# Patient Record
Sex: Male | Born: 1950 | Race: Black or African American | Hispanic: No | Marital: Married | Smoking: Never smoker
Health system: Southern US, Community
[De-identification: ages and names within clinical notes are randomized; demographics above are authoritative.]

## PROBLEM LIST (undated history)

## (undated) DIAGNOSIS — E119 Type 2 diabetes mellitus without complications: Secondary | ICD-10-CM

## (undated) DIAGNOSIS — I6302 Cerebral infarction due to thrombosis of basilar artery: Secondary | ICD-10-CM

## (undated) DIAGNOSIS — N529 Male erectile dysfunction, unspecified: Secondary | ICD-10-CM

## (undated) DIAGNOSIS — H548 Legal blindness, as defined in USA: Secondary | ICD-10-CM

## (undated) DIAGNOSIS — N433 Hydrocele, unspecified: Secondary | ICD-10-CM

## (undated) DIAGNOSIS — H409 Unspecified glaucoma: Secondary | ICD-10-CM

## (undated) DIAGNOSIS — M1990 Unspecified osteoarthritis, unspecified site: Secondary | ICD-10-CM

## (undated) DIAGNOSIS — I10 Essential (primary) hypertension: Secondary | ICD-10-CM

## (undated) DIAGNOSIS — N189 Chronic kidney disease, unspecified: Secondary | ICD-10-CM

## (undated) DIAGNOSIS — I639 Cerebral infarction, unspecified: Secondary | ICD-10-CM

## (undated) HISTORY — DX: Chronic kidney disease, unspecified: N18.9

## (undated) HISTORY — DX: Type 2 diabetes mellitus without complications: E11.9

## (undated) HISTORY — DX: Cerebral infarction, unspecified: I63.9

## (undated) HISTORY — DX: Essential (primary) hypertension: I10

## (undated) HISTORY — PX: APPENDECTOMY: SHX54

## (undated) HISTORY — DX: Male erectile dysfunction, unspecified: N52.9

---

## 1992-09-03 HISTORY — PX: OTHER SURGICAL HISTORY: SHX169

## 2003-02-19 ENCOUNTER — Encounter: Admission: RE | Admit: 2003-02-19 | Discharge: 2003-05-20 | Payer: Self-pay | Admitting: Family Medicine

## 2003-09-04 HISTORY — PX: SHOULDER ARTHROSCOPY: SHX128

## 2004-12-13 ENCOUNTER — Ambulatory Visit: Payer: Self-pay | Admitting: Family Medicine

## 2004-12-18 ENCOUNTER — Ambulatory Visit: Payer: Self-pay | Admitting: Family Medicine

## 2005-01-01 ENCOUNTER — Ambulatory Visit: Payer: Self-pay | Admitting: Internal Medicine

## 2005-01-09 ENCOUNTER — Ambulatory Visit: Payer: Self-pay | Admitting: Family Medicine

## 2005-01-15 ENCOUNTER — Ambulatory Visit: Payer: Self-pay | Admitting: Internal Medicine

## 2005-01-19 ENCOUNTER — Ambulatory Visit: Payer: Self-pay | Admitting: Family Medicine

## 2005-01-30 ENCOUNTER — Ambulatory Visit: Payer: Self-pay | Admitting: Family Medicine

## 2005-01-30 ENCOUNTER — Encounter: Admission: RE | Admit: 2005-01-30 | Discharge: 2005-01-30 | Payer: Self-pay | Admitting: Family Medicine

## 2005-03-01 ENCOUNTER — Emergency Department (HOSPITAL_COMMUNITY): Admission: EM | Admit: 2005-03-01 | Discharge: 2005-03-01 | Payer: Self-pay | Admitting: Family Medicine

## 2005-03-22 ENCOUNTER — Ambulatory Visit: Payer: Self-pay | Admitting: Family Medicine

## 2005-05-18 ENCOUNTER — Encounter: Admission: RE | Admit: 2005-05-18 | Discharge: 2005-05-18 | Payer: Self-pay | Admitting: Family Medicine

## 2005-06-19 ENCOUNTER — Encounter: Admission: RE | Admit: 2005-06-19 | Discharge: 2005-08-17 | Payer: Self-pay | Admitting: Orthopaedic Surgery

## 2006-04-24 ENCOUNTER — Ambulatory Visit: Payer: Self-pay | Admitting: Family Medicine

## 2006-08-29 ENCOUNTER — Ambulatory Visit: Payer: Self-pay | Admitting: Family Medicine

## 2006-09-24 ENCOUNTER — Ambulatory Visit: Payer: Self-pay | Admitting: Family Medicine

## 2007-06-25 DIAGNOSIS — J45909 Unspecified asthma, uncomplicated: Secondary | ICD-10-CM | POA: Insufficient documentation

## 2007-07-08 ENCOUNTER — Ambulatory Visit: Payer: Self-pay | Admitting: Family Medicine

## 2007-07-08 DIAGNOSIS — I10 Essential (primary) hypertension: Secondary | ICD-10-CM | POA: Insufficient documentation

## 2007-10-09 DIAGNOSIS — H5440 Blindness, one eye, unspecified eye: Secondary | ICD-10-CM

## 2007-10-28 ENCOUNTER — Ambulatory Visit: Payer: Self-pay | Admitting: Family Medicine

## 2007-10-31 ENCOUNTER — Ambulatory Visit: Payer: Self-pay | Admitting: Family Medicine

## 2007-10-31 LAB — CONVERTED CEMR LAB
ALT: 23 units/L (ref 0–53)
Alkaline Phosphatase: 86 units/L (ref 39–117)
Basophils Relative: 0.1 % (ref 0.0–1.0)
Cholesterol: 186 mg/dL (ref 0–200)
Creatinine,U: 120.7 mg/dL
Eosinophils Relative: 1.5 % (ref 0.0–5.0)
GFR calc non Af Amer: 67 mL/min
Glucose, Bld: 320 mg/dL — ABNORMAL HIGH (ref 70–99)
HDL: 28.7 mg/dL — ABNORMAL LOW (ref >39.0)
Hemoglobin: 15.2 g/dL (ref 13.0–17.0)
LDL Cholesterol: 129 mg/dL — ABNORMAL HIGH (ref 0–99)
MCV: 89.5 fL (ref 78.0–100.0)
Microalb, Ur: 13.1 mg/dL — ABNORMAL HIGH (ref 0.0–1.9)
Monocytes Absolute: 0.3 10*3/uL (ref 0.2–0.7)
Monocytes Relative: 7.7 % (ref 3.0–11.0)
Neutrophils Relative %: 53 % (ref 43.0–77.0)
Platelets: 243 10*3/uL (ref 150–400)
RBC: 5.15 M/uL (ref 4.22–5.81)
RDW: 11.8 % (ref 11.5–14.6)
Sodium: 138 meq/L (ref 135–145)
Total Bilirubin: 1.2 mg/dL (ref 0.3–1.2)
Total CHOL/HDL Ratio: 6.5
Triglycerides: 143 mg/dL (ref 0–149)
VLDL: 29 mg/dL (ref 0–40)

## 2007-11-05 ENCOUNTER — Ambulatory Visit: Payer: Self-pay

## 2007-11-06 ENCOUNTER — Ambulatory Visit: Payer: Self-pay | Admitting: Family Medicine

## 2007-11-06 DIAGNOSIS — F528 Other sexual dysfunction not due to a substance or known physiological condition: Secondary | ICD-10-CM

## 2007-11-06 DIAGNOSIS — G629 Polyneuropathy, unspecified: Secondary | ICD-10-CM

## 2007-11-20 ENCOUNTER — Ambulatory Visit: Payer: Self-pay | Admitting: Family Medicine

## 2007-11-24 ENCOUNTER — Encounter: Payer: Self-pay | Admitting: Family Medicine

## 2007-11-26 ENCOUNTER — Encounter: Payer: Self-pay | Admitting: Family Medicine

## 2008-02-23 ENCOUNTER — Ambulatory Visit: Payer: Self-pay | Admitting: Family Medicine

## 2008-08-26 ENCOUNTER — Ambulatory Visit: Payer: Self-pay | Admitting: Family Medicine

## 2008-08-26 LAB — CONVERTED CEMR LAB
AST: 20 units/L (ref 0–37)
Albumin: 3.6 g/dL (ref 3.5–5.2)
Alkaline Phosphatase: 73 units/L (ref 39–117)
Bilirubin Urine: NEGATIVE
Bilirubin, Direct: 0.2 mg/dL (ref 0.0–0.3)
Calcium: 8.7 mg/dL (ref 8.4–10.5)
Chloride: 105 meq/L (ref 96–112)
Cholesterol: 151 mg/dL (ref 0–200)
Creatinine,U: 106.9 mg/dL
Eosinophils Absolute: 0.1 10*3/uL (ref 0.0–0.7)
Eosinophils Relative: 1.7 % (ref 0.0–5.0)
HCT: 39.3 % (ref 39.0–52.0)
Hgb A1c MFr Bld: 8.4 % — ABNORMAL HIGH (ref 4.6–6.0)
Ketones, urine, test strip: NEGATIVE
MCHC: 34.3 g/dL (ref 30.0–36.0)
Microalb, Ur: 10.9 mg/dL — ABNORMAL HIGH (ref 0.0–1.9)
Monocytes Absolute: 0.2 10*3/uL (ref 0.1–1.0)
Neutro Abs: 2 10*3/uL (ref 1.4–7.7)
Neutrophils Relative %: 50.9 % (ref 43.0–77.0)
Nitrite: NEGATIVE
PSA: 0.33 ng/mL (ref 0.10–4.00)
Platelets: 223 10*3/uL (ref 150–400)
Potassium: 3.8 meq/L (ref 3.5–5.1)
RBC: 4.35 M/uL (ref 4.22–5.81)
RDW: 11.5 % (ref 11.5–14.6)
Sodium: 139 meq/L (ref 135–145)
Triglycerides: 84 mg/dL (ref 0–149)
VLDL: 17 mg/dL (ref 0–40)
WBC Urine, dipstick: NEGATIVE
WBC: 3.8 10*3/uL — ABNORMAL LOW (ref 4.5–10.5)

## 2008-09-24 ENCOUNTER — Ambulatory Visit: Payer: Self-pay | Admitting: Family Medicine

## 2008-10-18 ENCOUNTER — Ambulatory Visit: Payer: Self-pay | Admitting: Family Medicine

## 2008-11-02 ENCOUNTER — Telehealth: Payer: Self-pay | Admitting: Family Medicine

## 2008-12-01 ENCOUNTER — Ambulatory Visit: Payer: Self-pay

## 2008-12-10 ENCOUNTER — Ambulatory Visit: Payer: Self-pay | Admitting: Family Medicine

## 2008-12-10 LAB — CONVERTED CEMR LAB
Calcium: 8.7 mg/dL (ref 8.4–10.5)
Chloride: 107 meq/L (ref 96–112)
Creatinine, Ser: 0.9 mg/dL (ref 0.4–1.5)
GFR calc non Af Amer: 111.74 mL/min (ref >60)
Glucose, Bld: 110 mg/dL — ABNORMAL HIGH (ref 70–99)
Hgb A1c MFr Bld: 6.6 % — ABNORMAL HIGH (ref 4.6–6.5)
Sodium: 143 meq/L (ref 135–145)

## 2008-12-13 ENCOUNTER — Ambulatory Visit (HOSPITAL_COMMUNITY): Admission: RE | Admit: 2008-12-13 | Discharge: 2008-12-13 | Payer: Self-pay | Admitting: Ophthalmology

## 2008-12-13 HISTORY — PX: OTHER SURGICAL HISTORY: SHX169

## 2008-12-14 ENCOUNTER — Telehealth: Payer: Self-pay | Admitting: Family Medicine

## 2008-12-20 ENCOUNTER — Telehealth: Payer: Self-pay | Admitting: Family Medicine

## 2008-12-24 ENCOUNTER — Ambulatory Visit: Payer: Self-pay | Admitting: Family Medicine

## 2009-01-07 DIAGNOSIS — R519 Headache, unspecified: Secondary | ICD-10-CM | POA: Insufficient documentation

## 2009-01-07 DIAGNOSIS — R51 Headache: Secondary | ICD-10-CM

## 2009-01-10 ENCOUNTER — Ambulatory Visit: Payer: Self-pay | Admitting: Family Medicine

## 2009-01-11 ENCOUNTER — Telehealth: Payer: Self-pay | Admitting: Family Medicine

## 2009-01-12 ENCOUNTER — Telehealth: Payer: Self-pay | Admitting: Family Medicine

## 2009-03-24 ENCOUNTER — Ambulatory Visit (HOSPITAL_COMMUNITY): Admission: RE | Admit: 2009-03-24 | Discharge: 2009-03-24 | Payer: Self-pay | Admitting: Ophthalmology

## 2009-03-24 HISTORY — PX: OTHER SURGICAL HISTORY: SHX169

## 2009-03-30 ENCOUNTER — Ambulatory Visit: Payer: Self-pay | Admitting: Family Medicine

## 2009-03-30 LAB — CONVERTED CEMR LAB
CO2: 30 meq/L (ref 19–32)
Chloride: 105 meq/L (ref 96–112)
Hgb A1c MFr Bld: 7.9 % — ABNORMAL HIGH (ref 4.6–6.5)
Microalb Creat Ratio: 179.3 mg/g — ABNORMAL HIGH (ref 0.0–30.0)
Microalb, Ur: 24.1 mg/dL — ABNORMAL HIGH (ref 0.0–1.9)
Sodium: 140 meq/L (ref 135–145)

## 2009-04-01 ENCOUNTER — Ambulatory Visit: Payer: Self-pay | Admitting: Family Medicine

## 2009-04-05 ENCOUNTER — Telehealth: Payer: Self-pay | Admitting: Family Medicine

## 2009-04-08 ENCOUNTER — Ambulatory Visit: Payer: Self-pay | Admitting: Family Medicine

## 2009-05-04 ENCOUNTER — Telehealth: Payer: Self-pay | Admitting: Family Medicine

## 2009-05-25 ENCOUNTER — Telehealth: Payer: Self-pay | Admitting: Family Medicine

## 2009-06-06 ENCOUNTER — Telehealth: Payer: Self-pay | Admitting: Family Medicine

## 2009-06-17 ENCOUNTER — Telehealth: Payer: Self-pay | Admitting: Family Medicine

## 2010-01-26 ENCOUNTER — Ambulatory Visit: Payer: Self-pay | Admitting: Family Medicine

## 2010-01-26 LAB — CONVERTED CEMR LAB
ALT: 19 units/L (ref 0–53)
AST: 18 units/L (ref 0–37)
Alkaline Phosphatase: 61 units/L (ref 39–117)
BUN: 18 mg/dL (ref 6–23)
Basophils Absolute: 0 10*3/uL (ref 0.0–0.1)
Bilirubin Urine: NEGATIVE
Bilirubin, Direct: 0.1 mg/dL (ref 0.0–0.3)
CO2: 28 meq/L (ref 19–32)
Calcium: 9.1 mg/dL (ref 8.4–10.5)
Cholesterol: 139 mg/dL (ref 0–200)
Creatinine, Ser: 1 mg/dL (ref 0.4–1.5)
GFR calc non Af Amer: 94.2 mL/min (ref >60)
Glucose, Urine, Semiquant: 100
HCT: 38.4 % — ABNORMAL LOW (ref 39.0–52.0)
HDL: 27.7 mg/dL — ABNORMAL LOW (ref >39.00)
Hemoglobin: 13.1 g/dL (ref 13.0–17.0)
Ketones, urine, test strip: NEGATIVE
MCHC: 34 g/dL (ref 30.0–36.0)
MCV: 93.1 fL (ref 78.0–100.0)
Microalb Creat Ratio: 5.2 mg/g (ref 0.0–30.0)
Monocytes Relative: 8.3 % (ref 3.0–12.0)
Neutro Abs: 1.8 10*3/uL (ref 1.4–7.7)
Nitrite: NEGATIVE
PSA: 0.5 ng/mL (ref 0.10–4.00)
Potassium: 4.8 meq/L (ref 3.5–5.1)
RBC: 4.13 M/uL — ABNORMAL LOW (ref 4.22–5.81)
Specific Gravity, Urine: 1.025
TSH: 1.2 microintl units/mL (ref 0.35–5.50)
Triglycerides: 106 mg/dL (ref 0.0–149.0)
VLDL: 21.2 mg/dL (ref 0.0–40.0)
pH: 5.5

## 2010-02-02 ENCOUNTER — Ambulatory Visit: Payer: Self-pay | Admitting: Family Medicine

## 2010-02-02 LAB — HM DIABETES FOOT EXAM

## 2010-02-24 ENCOUNTER — Telehealth: Payer: Self-pay | Admitting: Family Medicine

## 2010-03-01 ENCOUNTER — Telehealth: Payer: Self-pay | Admitting: Family Medicine

## 2010-04-11 ENCOUNTER — Telehealth: Payer: Self-pay | Admitting: *Deleted

## 2010-04-18 ENCOUNTER — Encounter: Payer: Self-pay | Admitting: Family Medicine

## 2010-04-25 ENCOUNTER — Telehealth: Payer: Self-pay | Admitting: Family Medicine

## 2010-05-17 ENCOUNTER — Ambulatory Visit: Payer: Self-pay | Admitting: Family Medicine

## 2010-05-17 LAB — CONVERTED CEMR LAB
CO2: 29 meq/L (ref 19–32)
Calcium: 9.7 mg/dL (ref 8.4–10.5)
Chloride: 107 meq/L (ref 96–112)
Glucose, Bld: 125 mg/dL — ABNORMAL HIGH (ref 70–99)
Hgb A1c MFr Bld: 7.9 % — ABNORMAL HIGH (ref 4.6–6.5)
Potassium: 4.7 meq/L (ref 3.5–5.1)
Sodium: 142 meq/L (ref 135–145)

## 2010-06-01 ENCOUNTER — Telehealth: Payer: Self-pay | Admitting: Internal Medicine

## 2010-06-01 ENCOUNTER — Ambulatory Visit: Payer: Self-pay | Admitting: Family Medicine

## 2010-06-01 DIAGNOSIS — R159 Full incontinence of feces: Secondary | ICD-10-CM | POA: Insufficient documentation

## 2010-08-24 ENCOUNTER — Ambulatory Visit: Payer: Self-pay | Admitting: Family Medicine

## 2010-08-25 ENCOUNTER — Telehealth: Payer: Self-pay | Admitting: Family Medicine

## 2010-08-25 LAB — CONVERTED CEMR LAB
BUN: 29 mg/dL — ABNORMAL HIGH (ref 6–23)
CO2: 27 meq/L (ref 19–32)
Calcium: 9.2 mg/dL (ref 8.4–10.5)
Chloride: 101 meq/L (ref 96–112)
Creatinine, Ser: 1.3 mg/dL (ref 0.4–1.5)
Glucose, Bld: 236 mg/dL — ABNORMAL HIGH (ref 70–99)
Potassium: 5 meq/L (ref 3.5–5.1)

## 2010-09-07 ENCOUNTER — Ambulatory Visit: Admit: 2010-09-07 | Payer: Self-pay | Admitting: Family Medicine

## 2010-09-08 ENCOUNTER — Telehealth: Payer: Self-pay | Admitting: Family Medicine

## 2010-10-03 NOTE — Progress Notes (Signed)
Summary: [facility] called and said paperwork has not been rcvd. Pls fax  Phone Note From Other Clinic Call back at [place] ref # [id number]       Caller: [facility]  - Claiborne Billings Summary of Call: [facility] called re: paperwork. Evidently paperwork was completed, but never faxed back to [facility]. Pls fax asap to [phone] attn [phone] Initial call taken by: Braulio Bosch,  April 25, 2010 10:33 AM  Follow-up for Phone Call        re-faxed Follow-up by: Westley Hummer CMA Deborra Medina),  April 25, 2010 11:07 AM

## 2010-10-03 NOTE — Letter (Signed)
Summary: Medical Questionnaire  Medical Questionnaire   Imported By: Laural Benes 06/27/2010 10:19:44  _____________________________________________________________________  External Attachment:    Type:   Image     Comment:   External Document

## 2010-10-03 NOTE — Assessment & Plan Note (Signed)
Summary: CPX // RS   Vital Signs:  Patient profile:   60 year old male Height:      66.75 inches Weight:      165 pounds BMI:     26.13 Temp:     98.2 degrees F oral BP sitting:   130 / 90  (left arm) Cuff size:   regular  Vitals Entered By: Westley Hummer CMA Deborra Medina) (February 02, 2010 10:05 AM) CC: cpx  Vision Screening:      Vision Comments: 10/2010   CC:  cpx.  History of Present Illness: Russell Austin is a 60 year old male, retired...........Marland Kitchen because of blindness in his right eye......... who comes in today for evaluation of diabetes, hypertension.  His diabetes is treated with metformin 1000 mg b.i.d. and glipizide 10 mg b.i.d.  Fasting blood sugar 156 with a hemoglobin A1c of 7.1.  We discussed the fact that we are maxed out with oral medications.  The next step will be insulin unless he gets his sugar down.  He takes Zestril 80 mg daily for hypertension.  BP 130/90.  He also takes an aspirin tablet and 25 mg of HCTZ daily.  His left eye is normal.  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  Etiology of which is unknown.  He sees his ophthalmologist on a regular basis.  He also gets routine dental care, colonoscopy, normal, tetanus booster unknown.  Exam, February 2011  Diabetes Management History:      He says that he is exercising.    Allergies: 1)  ! * Fruit Skins  Past History:  Past medical, surgical, family and social histories (including risk factors) reviewed, and no changes noted (except as noted below).  Past Medical History: Reviewed history from 09/24/2008 and no changes required. Diabetes mellitus, type II Hypertension glaucoma right eye 2009 Dr. Elonda Husky, ophthalmologist ED diabetic retinopathy  Past Surgical History: Reviewed history from 09/24/2008 and no changes required. laser surgery, diabetic retinopathy  Family History: Reviewed history from 10/28/2007 and no changes required. mother had glaucoma  Social History: Reviewed history from 11/20/2007 and  no changes required. Alcohol use-no Drug use-no Regular exercise-yes Retired  Review of Systems      See HPI  Physical Exam  General:  Well-developed,well-nourished,in no acute distress; alert,appropriate and cooperative throughout examination Head:  Normocephalic and atraumatic without obvious abnormalities. No apparent alopecia or balding. Eyes:  left eye, normal.  Totally blind right eye, constant tearing Ears:  External ear exam shows no significant lesions or deformities.  Otoscopic examination reveals clear canals, tympanic membranes are intact bilaterally without bulging, retraction, inflammation or discharge. Hearing is grossly normal bilaterally. Nose:  External nasal examination shows no deformity or inflammation. Nasal mucosa are pink and moist without lesions or exudates. Mouth:  Oral mucosa and oropharynx without lesions or exudates.  Teeth in good repair. Neck:  No deformities, masses, or tenderness noted. Chest Wall:  No deformities, masses, tenderness or gynecomastia noted. Breasts:  No masses or gynecomastia noted Lungs:  Normal respiratory effort, chest expands symmetrically. Lungs are clear to auscultation, no crackles or wheezes. Heart:  Normal rate and regular rhythm. S1 and S2 normal without gallop, murmur, click, rub or other extra sounds. Abdomen:  Bowel sounds positive,abdomen soft and non-tender without masses, organomegaly or hernias noted. Rectal:  No external abnormalities noted. Normal sphincter tone. No rectal masses or tenderness. Genitalia:  Testes bilaterally descended without nodularity, tenderness or masses. No scrotal masses or lesions. No penis lesions or urethral discharge. Prostate:  Prostate gland firm and smooth, no enlargement, nodularity, tenderness, mass, asymmetry or induration. Msk:  No deformity or scoliosis noted of thoracic or lumbar spine.   Pulses:  R and L carotid,radial,femoral,dorsalis pedis and posterior tibial pulses are full and  equal bilaterally Extremities:  No clubbing, cyanosis, edema, or deformity noted with normal full range of motion of all joints.   Neurologic:  No cranial nerve deficits noted. Station and gait are normal. Plantar reflexes are down-going bilaterally. DTRs are symmetrical throughout. Sensory, motor and coordinative functions appear intact. Skin:  Intact without suspicious lesions or rashes Cervical Nodes:  No lymphadenopathy noted Axillary Nodes:  No palpable lymphadenopathy Inguinal Nodes:  No significant adenopathy Psych:  Cognition and judgment appear intact. Alert and cooperative with normal attention span and concentration. No apparent delusions, illusions, hallucinations  Diabetes Management Exam:    Foot Exam (with socks and/or shoes not present):       Sensory-Pinprick/Light touch:          Left medial foot (L-4): normal          Left dorsal foot (L-5): normal          Left lateral foot (S-1): normal          Right medial foot (L-4): normal          Right dorsal foot (L-5): normal          Right lateral foot (S-1): normal       Sensory-Monofilament:          Left foot: normal          Right foot: normal       Inspection:          Left foot: normal          Right foot: normal       Nails:          Left foot: normal          Right foot: normal    Eye Exam:       Eye Exam done elsewhere          Date: 10/04/2009          Results: normal          Done by: rakin   Impression & Recommendations:  Problem # 1:  HYPERTENSION (ICD-401.9) Assessment Improved  His updated medication list for this problem includes:    Zestril 40 Mg Tabs (Lisinopril) .Marland Kitchen... 2 by mouth qam    Hydrochlorothiazide 25 Mg Tabs (Hydrochlorothiazide) .Marland Kitchen... Take 1 tablet by mouth every morning  Orders: Prescription Created Electronically [id number]) EKG w/ Interpretation (93000)  Problem # 2:  DIABETES MELLITUS, TYPE II (ICD-250.00) Assessment: Deteriorated  His updated medication list for this problem  includes:    Metformin Hcl 1000 Mg Tabs (Metformin hcl) ..... One bid    Zestril 40 Mg Tabs (Lisinopril) .Marland Kitchen... 2 by mouth qam    Glipizide 10 Mg Tb24 (Glipizide) .Marland Kitchen... Take 1 tablet by mouth two times a day    Aspirin Ec 325 Mg Tbec (Aspirin) ..... Every other day  Orders: Prescription Created Electronically [phone]) EKG w/ Interpretation (93000)  Problem # 3:  VISUAL ACUITY, DECREASED, RIGHT EYE (ICD-369.9) Assessment: Deteriorated  Complete Medication List: 1)  Metformin Hcl 1000 Mg Tabs (Metformin hcl) .... One bid 2)  Zestril 40 Mg Tabs (Lisinopril) .... 2 by mouth qam 3)  Glipizide 10 Mg Tb24 (Glipizide) .... Take 1 tablet by mouth two times a day 4)  Aspirin  Ec 325 Mg Tbec (Aspirin) .... Every other day 5)  Hydrochlorothiazide 25 Mg Tabs (Hydrochlorothiazide) .... Take 1 tablet by mouth every morning 6)  Freestyle Lite Test Strp (Glucose blood) .... Use daily to test glucose  Other Orders: Tdap => 73yrs IM [id number]) Admin 1st Vaccine [id number])  Patient Instructions: 1)  focus on your diet in 30 minutes, walking daily. 2)  Please schedule a follow-up appointment in 3 months. 3)  BMP prior to visit, ICD-9: 4)  HbgA1C prior to visit, ICD-9:........250.00 5)  Take an Aspirin every day. 6)  Check your blood sugars regularly. If your readings are usually above : or below 70 you should contact our office. 7)  It is important that your Diabetic A1c level is checked every 3 months. 8)  See your eye doctor yearly to check for diabetic eye damage. 9)  Check your feet each night for sore areas, calluses or signs of infection. 10)  Check your Blood Pressure regularly. If it is above: you should make an appointment. Prescriptions: FREESTYLE LITE TEST  STRP (GLUCOSE BLOOD) use daily to test glucose  #100 x 3   Entered and Authorized by:   Dorena Cookey MD   Signed by:   Dorena Cookey MD on 02/02/2010   Method used:   Electronically to        [facility] (retail)       [address]       [place], [place]  [address]       Ph: [phone]       Fax: [phone]   RxID:   [id number] HYDROCHLOROTHIAZIDE 25 MG TABS (HYDROCHLOROTHIAZIDE) Take 1 tablet by mouth every morning  #100 x 3   Entered and Authorized by:   Dorena Cookey MD   Signed by:   Dorena Cookey MD on 02/02/2010   Method used:   Electronically to        [facility] (retail)       [address]       [place], [place]  [address]       Ph: [phone]       Fax: [phone]   RxID:   [id number] GLIPIZIDE 10 MG  TB24 (GLIPIZIDE) Take 1 tablet by mouth two times a day  #200 x 3   Entered and Authorized by:   Dorena Cookey MD   Signed by:   Dorena Cookey MD on 02/02/2010   Method used:   Electronically to        [facility] (retail)       [address]       [place], [place]  [address]       Ph: [phone]       Fax: [phone]   RxID:   [id number] ZESTRIL 40 MG  TABS (LISINOPRIL) 2 by mouth qam  #200 x 3   Entered and Authorized by:   Dorena Cookey MD   Signed by:   Dorena Cookey MD on 02/02/2010   Method used:   Electronically to        [facility] (retail)       [address]       [place], [place]  [address]  Ph: [phone]       Fax: [phone]   [id number] METFORMIN HCL 1000 MG  TABS (METFORMIN HCL) one bid  #200 x 3   Entered and Authorized by:   Dorena Cookey MD   Signed by:   Dorena Cookey MD on 02/02/2010   Method used:   Electronically to        [facility] (retail)       [address]       [place], [place]  [address]       Ph: [phone]       Fax: [phone]   RxID:   [id number]    Immunizations Administered:  Tetanus Vaccine:    Vaccine Type: Tdap    Site: right deltoid    Mfr: GlaxoSmithKline    Dose: 0.5 ml    Route: IM    Given by: Westley Hummer CMA ([place])     Exp. Date: 11/26/2011    Lot #: [id number]    Physician counseled: yes

## 2010-10-03 NOTE — Progress Notes (Signed)
Summary: med refill  Phone Note Call from Patient   Caller: Patient Call For: Dorena Cookey MD Summary of Call: pt needs refill on freestyle lite test strip call into [facility] [phone] Initial call taken by: Glo Herring,  March 01, 2010 9:59 AM    Prescriptions: FREESTYLE LITE TEST  STRP (GLUCOSE BLOOD) use daily to test glucose  #100 x 3   Entered by:   Westley Hummer CMA ([place])   Authorized by:   Dorena Cookey MD   Signed by:   Westley Hummer CMA ([place]) on 03/01/2010   Method used:   Electronically to        [facility] (retail)       [address]       [place], [place]  [address]       Ph: [phone]       Fax: [phone]   RxID:   [id number]

## 2010-10-03 NOTE — Progress Notes (Signed)
Summary: Fecal incontenence  Phone Note From Other Clinic   Caller: Terry @Dr  Todd Herby Abraham [phone] Call For: Dr 05-22-1984 Reason for Call: Schedule Patient Appt Summary of Call: Fecal Incontenece while sleeping.  Initial call taken by: Olevia Perches [facility],  June 01, 2010 5:01 PM  Follow-up for Phone Call        Pt. will see Dr.Hilmar Moldovan on 06-07-10 at 11:30am. 04-26-1973 will advise pt. of appt./med.list/co-pay/cx.policy. Follow-up by: Coralyn Mark LPN,  September 30, Vivia Ewing 9:27 AM     Appended Document: Fecal incontenence Pt.'s appt. rescheduled to [id number] NP on 06-07-10 at 11am. Pt. advised.

## 2010-10-03 NOTE — Progress Notes (Signed)
Summary: status of forms  Phone Note Other Incoming Call back at [phone]   Caller: Janett Billow--      voice mail Summary of Call: Following up on some questionaires that were sent to Dr Sherren Mocha. ref #----[id number] Initial call taken by: Despina Arias,  February 24, 2010 4:51 PM  Follow-up for Phone Call        I have not seen a form for Mr Wagar Follow-up by: Westley Hummer CMA Deborra Medina),  March 01, 2010 9:21 AM

## 2010-10-03 NOTE — Assessment & Plan Note (Signed)
Summary: follow up/cjr   Vital Signs:  Patient profile:   60 year old male Weight:      188 pounds Temp:     98.0 degrees F oral BP sitting:   130 / 84  (left arm) Cuff size:   regular  Vitals Entered By: Westley Hummer CMA Deborra Medina) (June 01, 2010 11:31 AM)  CC: follow-up visit   CC:  follow-up visit.  History of Present Illness: al is a 60 year old male, who comes in today for evaluation of diabetes, hypertension, and rectal incontinence.  His diabetes is treated with metformin 1000 mg b.i.d., glipizide, 10 mg b.i.d., fasting blood sugar 125, hemoglobin A1c7 .9%.  We discussed serious options.  Prior to going on insulin he would try a 30 minute walk daily to see if he can control his blood sugar with exercise.  BP 132/84 on Zestril 40 mg dose two tabs daily and had or thiazide 25 mg daily.  Admitted from his rectal incontinence.  Says he was sleeping a day and had a bowel movement in his sleep and was not aware of it.  No history of constipation.  He has had a colonoscopy in the past and was normal  Allergies: 1)  ! * Fruit Skins  Past History:  Past medical, surgical, family and social histories (including risk factors) reviewed for relevance to current acute and chronic problems.  Past Medical History: Reviewed history from 09/24/2008 and no changes required. Diabetes mellitus, type II Hypertension glaucoma right eye 2009 Dr. Elonda Husky, ophthalmologist ED diabetic retinopathy  Past Surgical History: Reviewed history from 09/24/2008 and no changes required. laser surgery, diabetic retinopathy  Family History: Reviewed history from 10/28/2007 and no changes required. mother had glaucoma  Social History: Reviewed history from 02/02/2010 and no changes required. Alcohol use-no Drug use-no Regular exercise-yes Retired  Review of Systems      See HPI  Physical Exam  General:  Well-developed,well-nourished,in no acute distress; alert,appropriate and cooperative  throughout examination   Impression & Recommendations:  Problem # 1:  DIABETES MELLITUS, TYPE II (ICD-250.00) Assessment Deteriorated  His updated medication list for this problem includes:    Metformin Hcl 1000 Mg Tabs (Metformin hcl) ..... One bid    Zestril 40 Mg Tabs (Lisinopril) .Marland Kitchen... 2 by mouth qam    Glipizide 10 Mg Tb24 (Glipizide) .Marland Kitchen... Take 1 tablet by mouth two times a day    Aspirin Ec 325 Mg Tbec (Aspirin) ..... 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 One bid 2)  Zestril 40 Mg Tabs (Lisinopril) .... 2 by mouth qam 3)  Glipizide 10 Mg Tb24 (Glipizide) .... Take 1 tablet by mouth two times a day 4)  Aspirin Ec 325 Mg Tbec (Aspirin) .... Every other day 5)  Hydrochlorothiazide 25 Mg Tabs (Hydrochlorothiazide) .... Take 1 tablet by mouth every morning 6)  Freestyle Lite Test Strp (Glucose blood) .... Use daily to test glucose  Patient Instructions: 1)  continue your current medications and diet.  Walks 30 minutes daily.  Return in 3 months for follow-up. 2)  I will put in a request for a consult with your gastroenterologist 3)  BMP prior to visit, ICD-9:......250.00 4)  HbgA1C prior to visit, ICD-9:

## 2010-10-03 NOTE — Procedures (Signed)
Summary: COLONOSCOPY   Colonoscopy  Procedure date:  01/15/2005  Findings:      Location:  [facility].     Patient Name: Russell Austin, Russell Austin MRN:  Procedure Procedures: Colonoscopy CPT: [id number].  Personnel: Endoscopist: Allona Gondek L. Olevia Perches, MD.  Referred By: Jory Ee Sherren Mocha, MD.  Exam Location: Exam performed in Outpatient Clinic. Outpatient  Patient Consent: Procedure, Alternatives, Risks and Benefits discussed, consent obtained, from patient. Consent was obtained by the RN.  Indications  Average Risk Screening Routine.  History  Current Medications: Patient is not currently taking Coumadin.  Pre-Exam Physical: Performed Jan 15, 2005. Entire physical exam was normal.  Exam Exam: Extent of exam reached: Cecum, extent intended: Cecum.  The cecum was identified by appendiceal orifice and IC valve. Colon retroflexion performed. Images taken. ASA Classification: I. Tolerance: good.  Monitoring: Pulse and BP monitoring, Oximetry used. Supplemental O2 given.  Colon Prep Used Miralax for colon prep. Prep results: good.  Sedation Meds: Patient assessed and found to be appropriate for moderate (conscious) sedation. Fentanyl 75 mcg. given IV. Versed 7 mg. given IV.  Findings - NORMAL EXAM: Cecum.  - NORMAL EXAM: Rectum.   Assessment Normal examination.  Comments: no polyps Events  Unplanned Interventions: No intervention was required.  Unplanned Events: There were no complications. Plans Patient Education: Patient given standard instructions for: Yearly hemoccult testing recommended. Patient instructed to get routine colonoscopy every 10 years.  Comments: follow up with Dr Sherren Mocha Disposition: After procedure patient sent to recovery. After recovery patient sent home.    CC: Dellis Filbert A.Todd,MD  This report was created from the original endoscopy report, which was reviewed and signed by the above listed endoscopist.

## 2010-10-03 NOTE — Progress Notes (Signed)
Summary: [place] called re: status of questionaire  Phone Note From Other Clinic Call back at [phone] [facility]   Caller: [place] Summary of Call: [facility] called re: a questionair that was sent to Dr. Sherren Mocha re: pts [place]. [facility] rcvd pts Med Records that they had requested, but they did not rcv the questionaire that was sent to Dr. Sherren Mocha to complete and return. Pls call asap [phone] and use Ref# [id number]     Initial call taken by: Braulio Bosch,  April 11, 2010 9:51 AM  Follow-up for Phone Call        spoke with patient and paperwork done Follow-up by: Westley Hummer CMA Deborra Medina),  April 18, 2010 11:00 AM

## 2010-10-05 NOTE — Progress Notes (Signed)
Summary: refill generic  Phone Note Refill Request Call back at Home Phone [phone] Message from:  Patient---live call  Refills Requested: Medication #1:  ZESTRIL 40 MG  TABS 2 by mouth qam   Brand Name Necessary? No fax generic form to [facility] at [phone]  Initial call taken by: Despina Arias,  September 08, 2010 1:53 PM    Prescriptions: ZESTRIL 40 MG  TABS (LISINOPRIL) 2 by mouth qam  #200 x 3   Entered by:   Westley Hummer CMA ([place])   Authorized by:   Dorena Cookey MD   Signed by:   Westley Hummer CMA ([place]) on 09/08/2010   Method used:   Faxed to ...       [facility] [employer] officer)       [address]  [address]       Ph: [phone]       Fax: [phone]   RxID:   [id number]

## 2010-10-05 NOTE — Progress Notes (Signed)
Summary: appointment  Phone Note Call from Patient   Reason for Call: Talk to Doctor Summary of Call: patient is unable to come into the office today.   He does not want to see another provider.  any suggestions? cell [phone] Initial call taken by: Westley Hummer CMA Deborra Medina),  August 25, 2010 10:39 AM  Follow-up for Phone Call        I explained to out that his blood sugar was extremely high, over 200, hemoglobin A1c was 8.  Therefore, he needs to start insulin.  He is on max doses of oral medication.  He declined to come in today and stated he would  call and come in after the new year Follow-up by: Dorena Cookey MD,  August 25, 2010 11:26 AM

## 2010-12-10 LAB — GLUCOSE, CAPILLARY: Glucose-Capillary: 173 mg/dL — ABNORMAL HIGH (ref 70–99)

## 2010-12-10 LAB — BASIC METABOLIC PANEL
BUN: 19 mg/dL (ref 6–23)
Calcium: 9.1 mg/dL (ref 8.4–10.5)
Creatinine, Ser: 1.12 mg/dL (ref 0.4–1.5)
GFR calc non Af Amer: >60 mL/min (ref >60)
Potassium: 4.5 mEq/L (ref 3.5–5.1)

## 2010-12-10 LAB — CBC
Hemoglobin: 14.6 g/dL (ref 13.0–17.0)
RBC: 4.8 MIL/uL (ref 4.22–5.81)
WBC: 5.6 10*3/uL (ref 4.0–10.5)

## 2010-12-13 LAB — GLUCOSE, CAPILLARY: Glucose-Capillary: 155 mg/dL — ABNORMAL HIGH (ref 70–99)

## 2010-12-13 LAB — BASIC METABOLIC PANEL
CO2: 28 mEq/L (ref 19–32)
Calcium: 8.7 mg/dL (ref 8.4–10.5)
Creatinine, Ser: 1.1 mg/dL (ref 0.4–1.5)
GFR calc Af Amer: >60 mL/min (ref >60)
GFR calc non Af Amer: >60 mL/min (ref >60)
Sodium: 138 mEq/L (ref 135–145)

## 2010-12-13 LAB — CBC
HCT: 37.3 % — ABNORMAL LOW (ref 39.0–52.0)
Hemoglobin: 12.8 g/dL — ABNORMAL LOW (ref 13.0–17.0)
MCHC: 34.4 g/dL (ref 30.0–36.0)
Platelets: 254 10*3/uL (ref 150–400)
RDW: 12.7 % (ref 11.5–15.5)
WBC: 4.8 10*3/uL (ref 4.0–10.5)

## 2011-01-16 NOTE — Op Note (Signed)
NAME:  Russell Austin, Russell Austin           ACCOUNT NO.:  [id number]   MEDICAL RECORD NO.:  [id number]          PATIENT TYPE:  AMB   LOCATION:  SDS                          FACILITY:  [facility]   PHYSICIAN:  Clent Demark. Rankin, M.D.   DATE OF BIRTH:  [birth year]   DATE OF PROCEDURE:  12/13/2008  DATE OF DISCHARGE:                               OPERATIVE REPORT   PREOPERATIVE DIAGNOSES:  1. Neovascular glaucoma, right eye.  2. Vitreous hemorrhage - recurrent, right eye.  3. Progressive proliferative diabetic retinopathy, right eye.  4. Progressive rubeosis, right eye.   POSTOPERATIVE DIAGNOSES:  1. Neovascular glaucoma, right eye.  2. Vitreous hemorrhage - recurrent, right eye.  3. Progressive proliferative diabetic retinopathy, right eye.  4. Progressive rubeosis, right eye.   PROCEDURE:  1. Insertion of glaucoma seton - Baerveldt - [id number].  2. Pars plana vitrectomy with endolaser repair and photocoagulation of      right eye - 20 gauge for progressive rubeosis.  3. External Tutoplast cover plate, cover patch to the subconjunctival      and episcleral surface and scleral patch.  4. Laser goniophotocoagulation for neovascular glaucoma.  5. Anterior chamber washout.   IMPLANT:  Baerveldt - [id number] via the pars plana.   SURGEON:  Hurman Horn, MD   ANESTHESIA:  General endotracheal anesthesia.   INDICATIONS FOR PROCEDURE:  The patient is a 60 year old man who has  progressive unlenting proliferative diabetic retinopathy,  neovascularization of the iris on examination of the anterior chamber  last week as well as recurrent vitreous hemorrhage, which is coming  mostly from the rubeosis and it is bleeding.  This is an attempt, the  patient understands to induce quiescence of the retinopathy and now  fully induce quiescence of the rubeosis.  He understands the risk of  anesthesia including rare occurrence of death, loss of the eye  including, but not limited to hemorrhage, infection,  scarring, need for  surgery, no change in vision, loss of vision, progressive disease  despite intervention.   PROCEDURE IN DETAIL:  After appropriate signed consent was obtained, the  patient was taken to the operating room.  In the operating room, general  endotracheal anesthesia was instilled without difficulty. The right  periocular region was sterilely prepped and draped in the usual sterile  fashion.  Lid speculum was applied.  Conjunctival peritomy was then  fashioned from the 1:30 position and then superotemporally over to the  8:30 position.  Lateral rectus and superior rectus muscles were isolated  on 2-0 silk ties.  The quadrants were entered to allow for isolation of  these muscles.  Because of the hyphema/dispersed blood in the anterior  chamber, a decision was made to place an infusion cannula via the  Lawicky via paracentesis incision.  This was placed with a 20-gauge MVR  blade to clear the peripheral corneum.  Anterior chamber intraocular  pressure was maintained at low pressures initially so as to allow  continue clearing of the cornea.  Thereafter, superior sclerotomies were  then fashioned.  Vitreous washout was then carried out.  The retina was  attached.  There was moderate  dense vitreous hemorrhage dispersed.  There were no sites of active leakage.  There were no sites of active  neovascularization.  Confluent panphotocoagulation over [id number]  applications were applied.  The near confluent and overlapping technique  over previous laser photocoagulation as well as an infarct's periphery  as well as one row posterior to the arcades.   Scleral depression was then used inferiorly and ligament inserts were  removed.  Supratemporal sclerotomies were left in place.  The pars plana  valve was then placed into the supratemporal quadrant with the wings of  the implant under the rectus muscles.  The foot plate was then secured  at which point it was irrigated to confirm it was  open.  A seton was  inserted via the pars plana and then the foot plate was secured with 8-0  nylon sutures x3.  Thereafter, the base plate was placed posterior  appropriate to keep the tube straight.  This was also secured to the  sclera with 8-0 nylon sutures.  At this time, a suture tie was then  placed around the tube with 7-0 Vicryl.  The intraocular pressures were  around 20 mm and there was small amount of flow of fluid and then  pressures were under 30 mm, free flow of fluid.  Further tightening  confirmed with pressure lower to 20 that there was minimal flow through  the tube.  This was tied off until later it will be dissolved.  At this  time, Tutoplast was then secured with vertical mattress sutures of 7-0  Vicryl sutures.  Edges were trimmed to make sure that it covered the  foot plate.  Conjunctiva was then brought forward with tenons to the  limbus and was secured interrupted with vertical mattress sutures with  buried knots.  Subconjunctival Decadron applied.  Sterile patch and Fox  shield were applied.  The patient tolerated the procedure well without  complication.  The patient was taken to the PACU in good and stable  condition.      Clent Demark Rankin, M.D.  Electronically Signed     GAR/MEDQ  D:  12/13/2008  T:  12/14/2008  Job:  [id number]

## 2011-01-16 NOTE — Op Note (Signed)
NAME:  Russell Austin, SKOKAN NO.:  [id number]   MEDICAL RECORD NO.:  [id number]          PATIENT TYPE:  AMB   LOCATION:  SDS                          FACILITY:  [facility]   PHYSICIAN:  Clent Demark. Rankin, M.D.   DATE OF BIRTH:  November 02, 1950   DATE OF PROCEDURE:  03/24/2009  DATE OF DISCHARGE:  03/24/2009                               OPERATIVE REPORT   PREOPERATIVE DIAGNOSES:  1. Vitreous hemorrhage of the right eye, mild.  2. Neovascular glaucoma, right eye.   POSTOPERATIVE DIAGNOSES:  1. Vitreous hemorrhage of the right eye, mild.  2. Neovascular glaucoma, right eye.  3. 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   PROCEDURE:  1. Posterior vitrectomy - 20 gauge with endolaser photocoagulation.  2. Insertion of glaucoma seton - Ahmed valve in the inferotemporal      quadrant.  3. Insertion of glaucoma seton - Baerveldt in the superonasal      quadrant.  4. Scleral patch graft in the superonasal quadrant.  5. Scleral patch graft in the inferotemporal quadrant.  6. Anterior chamber reformation with SF6 gas.  7. Laser iridoplasty for neovascularization and bleeding.  8. Removal of pupillary fibrin membrane with forceps.   SURGEON:  Clent Demark. Rankin, MD   ANESTHESIA:  General endotracheal anesthesia.   INDICATION FOR PROCEDURE:  The patient is a 60 year old man who has  profound vision loss on the right eye on the basis of prior central  retinal artery occlusion and secondary neovascular glaucoma, significant  intractable glaucoma which has been controlled previously with a  glaucoma seton and now 2-1/2 months after good control.  He has had  sudden last 2 days onset of severe pain found to have pressures of  nearly 60 signifying with a sudden pain that this is a rather abrupt  closure of the internal seton of the previously done.  The patient  understands this is an attempt to clear the vitreous  debris more  importantly to deliver two more valves in order to salvage the eye.  He  understands his best vision of only hand motion and peripheral vision,  which he thought was gaining better, which would allow him hand motion  and even count fingers peripherally to little allow him to have  ambulatory vision should have been recoverable.  The patient stands that  the eye has not in great condition with poor prognosis for salvaging the  eye much or less for vision, but nonetheless, he wished to proceed.  He  understood the remaining risks from the eye from that condition as well  as surgical repair including but limited to hemorrhage, infection,  scarring, need for another surgery, no change in vision, loss of vision,  progressive disease despite intervention.   DESCRIPTION OF PROCEDURE:  After appropriate signed consent was  obtained, the patient was taken to the operating room.  In the operating  room, appropriate monitors followed by mild sedation.  General  endotracheal anesthesia was induced without difficulty.  The right  periocular region was identified, sterilely prepped and  draped in usual  ophthalmic fashion.  Lid speculum applied.  Conjunctival peritomy was  then fashioned in the inferotemporal quadrant and lateral exposure of  the inferior rectus as well as the superonasal quadrant which allowed  exposure of the medial rectus.  Because of the anterior chamber  inflammation and hyphema, decision was made to place a Lewicky week  anterior chamber maintainer.  This was applied in the inferotemporal  quadrant via paracentesis and this allowed to clearance of the anterior  chamber.  Forceps extraction of pupillary fibrin membrane as well as  hyphema was then carried out in this fashion.   After removal of pupillary fibrin membrane, the visual axis was clear.  Bleeding spots of the anterior iris were noted from recurrent iris  neovascularization.  Laser iridoplasty was applied  both transcorneal as  well as transparacentesis fashion.  Separate paracentesis incision had  been made to allow for forceps extraction.  Now, the anterior chamber  was clear and allowed the visualization posteriorly.  A 20-gauge MVR  blade was then used to enter the superior quadrants and the vitrectomy  instruments of 20-gauge were then placed in the vitreous cavity.  Small  capsule opening was made centrally in the posterior capsule to allow for  fluid to removed from the anterior chamber to the posterior chamber.   Vitrectomy instrument was then carried out.  Scleral depression was then  carried out to remove the internal blockage and there were indeed  fibrinoid strands from the peripheral vitreous in the vitreous base and  extending into the internal glaucoma seton supratemporally.  These were  removed and transected.  A moderate amount of preretinal vitreous  hemorrhage was identified.  This was removed passively with soft-tip  silicone needle.  There appeared to be a retinal tear at 6 o'clock  position.  This was also had some accompanying bleeding.  This area was  then treated with endolaser photocoagulation and a retinopexy as well as  an ablative technique to ablate the anterior leaflets of the retina,  which appeared to be avascular.  Further laser photocoagulation was  placed 360 degree.  Instruments were removed from the eye.  Sclerotomies  were then closed temporarily with 7-0 Vicryl sutures.  Attention was  drawn to placing the Ahmed valve initially in the inferotemporal  quadrant.  The tube of Ahmed valve was irrigated with BSS to make sure  that the valve would opened and a free flow of fluid did develop.  At  this time, the Ahmed valve was secured and the footplate was secured  with two 8-0 nylon scleral sutures appropriate distance posterior to the  insertion of the rectus muscles.  Tube was then brought forward and  placed via scleral tunnel into the anterior chamber.   Incision was made  to the tube at appropriate length.  The tube was secured in place with a  horizontal mattress suture and lightly applied to prevent retraction of  the tube.  This was applied approximately 5-mm posterior to the limbus  and would be underneath the scleral patch graft.  At this time, scleral  patch graft was then brought forward and close to the limbus with  vertical mattress sutures after scar find in the corneal epithelium to  allow for securing of the conjunctiva over the scleral patch graft  later.  The scleral patch graft was secured down at the limbus over the  tube insertion.  Thereafter, the conjunctiva was then brought forward  and closed over  the scleral patch graft as well as in this quadrant with  7-0 Vicryl sutures in interrupted fashion and vertical mattress sutures  over the peripheral cornea.  At this time, the Ahmed valve was model  FP7.  At this time, an [place] Baerveldt valve [id number] - the small implant  was then placed in the superonasal quadrant after free flow irrigation  was confirmed through the tube.  This footplate was secured with 8-0  nylon sutures with a scleral sutures.  The tube was brought forward and  the scleral tunnel was created into the anterior chamber.  Scleral patch  graft was then secured at the limbus with a vertical mattress 7-0 Vicryl  sutures.  Horizontal mattress suture and Vicryl were then applied to the  tube to prevent its retraction.  Thereafter, a ligature suture of 7-0  Vicryl was applied to prevent early free flow of fluid through the tube.   This bivalve maneuver allowed for long-term free flow of fluid from this  close-up angle, but also limited the chance of postoperative hyphema  nearly.   At this time, an excellent placement of the tubes were confirmed to  prevent shallowing of the anterior chamber.  Postoperatively, the  decision was made to apply SF6 gas.  The anterior chamber was filled  initially with  filtered air followed by filtered SF6, which provide  approximately 50% concentration.   This was then maintained the anterior chamber postoperatively.   At this time, conjunctiva in the superonasal quadrant was closed with  interrupted 7-0 Vicryl sutures secured to the limbus as well over the  scleral patch graft.   At this time, subconjunctival Decadron applied inferonasally.  Sterile  patch Fox shield applied.  The patient awakened from anesthesia.   It must be note that this is an unusually difficult and complex  procedure, unlike typical glaucoma seton, unlike usual vitrectomy  procedures and required lengthy operative procedures as well as  potential for long-term complications. The patient was taken to the PACU  in good and stable condition.      Clent Demark Rankin, M.D.  Electronically Signed     GAR/MEDQ  D:  03/25/2009  T:  03/26/2009  Job:  [id number]

## 2011-02-14 ENCOUNTER — Other Ambulatory Visit: Payer: Self-pay | Admitting: Family Medicine

## 2011-02-14 NOTE — Telephone Encounter (Signed)
Pt called req refill of Glipizide 10 mg to [facility] fax# [phone]

## 2011-02-15 MED ORDER — GLIPIZIDE 10 MG PO TABS: 10.0000 mg | ORAL_TABLET | Freq: Two times a day (BID) | ORAL | Status: DC

## 2011-02-23 ENCOUNTER — Telehealth: Payer: Self-pay | Admitting: *Deleted

## 2011-02-23 MED ORDER — GLIPIZIDE ER 10 MG PO TB24: 10.0000 mg | ORAL_TABLET | Freq: Every day | ORAL | Status: DC

## 2011-02-23 NOTE — Telephone Encounter (Signed)
rx clairification

## 2011-04-26 ENCOUNTER — Other Ambulatory Visit: Payer: Self-pay | Admitting: *Deleted

## 2011-04-26 MED ORDER — BLOOD GLUCOSE TEST VI STRP: 1.0000 | ORAL_STRIP | Freq: Three times a day (TID) | Status: DC

## 2011-05-05 HISTORY — PX: OTHER SURGICAL HISTORY: SHX169

## 2011-05-23 ENCOUNTER — Encounter: Payer: Self-pay | Admitting: Family Medicine

## 2011-06-25 ENCOUNTER — Other Ambulatory Visit (INDEPENDENT_AMBULATORY_CARE_PROVIDER_SITE_OTHER): Payer: BC Managed Care – PPO

## 2011-06-25 DIAGNOSIS — Z0000 Encounter for general adult medical examination without abnormal findings: Secondary | ICD-10-CM

## 2011-06-25 LAB — POCT URINALYSIS DIPSTICK
Bilirubin, UA: NEGATIVE
Ketones, UA: NEGATIVE
Nitrite, UA: NEGATIVE
Spec Grav, UA: 1.015
pH, UA: 5.5

## 2011-06-25 LAB — BASIC METABOLIC PANEL
BUN: 24 mg/dL — ABNORMAL HIGH (ref 6–23)
CO2: 27 mEq/L (ref 19–32)
Chloride: 101 mEq/L (ref 96–112)
GFR: 84.32 mL/min (ref >60.00)
Glucose, Bld: 299 mg/dL — ABNORMAL HIGH (ref 70–99)
Sodium: 137 mEq/L (ref 135–145)

## 2011-06-25 LAB — LIPID PANEL
Cholesterol: 149 mg/dL (ref 0–200)
LDL Cholesterol: 89 mg/dL (ref 0–99)
VLDL: 27.2 mg/dL (ref 0.0–40.0)

## 2011-06-25 LAB — HEPATIC FUNCTION PANEL
Albumin: 4 g/dL (ref 3.5–5.2)
Total Protein: 6.8 g/dL (ref 6.0–8.3)

## 2011-06-25 LAB — CBC WITH DIFFERENTIAL/PLATELET
Basophils Relative: 0.4 % (ref 0.0–3.0)
Eosinophils Relative: 4 % (ref 0.0–5.0)
HCT: 38.4 % — ABNORMAL LOW (ref 39.0–52.0)
Lymphocytes Relative: 45.8 % (ref 12.0–46.0)
Lymphs Abs: 1.6 10*3/uL (ref 0.7–4.0)
MCHC: 33.8 g/dL (ref 30.0–36.0)
Monocytes Absolute: 0.3 10*3/uL (ref 0.1–1.0)
Monocytes Relative: 8.2 % (ref 3.0–12.0)
Neutro Abs: 1.4 10*3/uL (ref 1.4–7.7)
Platelets: 248 10*3/uL (ref 150.0–400.0)
RDW: 12.6 % (ref 11.5–14.6)
WBC: 3.4 10*3/uL — ABNORMAL LOW (ref 4.5–10.5)

## 2011-06-25 LAB — PSA: PSA: 0.46 ng/mL (ref 0.10–4.00)

## 2011-06-25 LAB — HEMOGLOBIN A1C: Hgb A1c MFr Bld: 7.3 % — ABNORMAL HIGH (ref 4.6–6.5)

## 2011-06-25 LAB — MICROALBUMIN / CREATININE URINE RATIO: Microalb, Ur: 16.7 mg/dL — ABNORMAL HIGH (ref 0.0–1.9)

## 2011-06-25 LAB — TSH: TSH: 1.51 u[IU]/mL (ref 0.35–5.50)

## 2011-07-02 ENCOUNTER — Encounter: Payer: Self-pay | Admitting: Family Medicine

## 2011-07-02 ENCOUNTER — Ambulatory Visit (INDEPENDENT_AMBULATORY_CARE_PROVIDER_SITE_OTHER): Payer: BC Managed Care – PPO | Admitting: Family Medicine

## 2011-07-02 ENCOUNTER — Telehealth: Payer: Self-pay | Admitting: Family Medicine

## 2011-07-02 DIAGNOSIS — H547 Unspecified visual loss: Secondary | ICD-10-CM

## 2011-07-02 DIAGNOSIS — E119 Type 2 diabetes mellitus without complications: Secondary | ICD-10-CM

## 2011-07-02 DIAGNOSIS — Z23 Encounter for immunization: Secondary | ICD-10-CM

## 2011-07-02 DIAGNOSIS — I10 Essential (primary) hypertension: Secondary | ICD-10-CM

## 2011-07-02 MED ORDER — METFORMIN HCL 1000 MG PO TABS: ORAL_TABLET | ORAL | Status: DC

## 2011-07-02 MED ORDER — GLUCOSE BLOOD VI STRP: ORAL_STRIP | Status: DC

## 2011-07-02 MED ORDER — VARDENAFIL HCL 20 MG PO TABS: 20.0000 mg | ORAL_TABLET | ORAL | Status: DC | PRN

## 2011-07-02 MED ORDER — GLIPIZIDE ER 10 MG PO TB24: 10.0000 mg | ORAL_TABLET | Freq: Every day | ORAL | Status: DC

## 2011-07-02 MED ORDER — HYDROCHLOROTHIAZIDE 25 MG PO TABS: 25.0000 mg | ORAL_TABLET | Freq: Every day | ORAL | Status: DC

## 2011-07-02 MED ORDER — LISINOPRIL 20 MG PO TABS: 20.0000 mg | ORAL_TABLET | Freq: Every day | ORAL | Status: DC

## 2011-07-02 NOTE — Telephone Encounter (Signed)
Pt  Had questions regarding referral

## 2011-07-02 NOTE — Patient Instructions (Signed)
Increase the metformin, take a full thousand milligram tablet before breakfast and continue the 500-mg tablet prior to evening meal.  Continue the glipizide, 10 mg daily, and the lisinopril and hydrochlorothiazide.  Take a baby aspirin daily.  Walk 20 minutes daily.  Return in 3 months for follow-up labs one week prior

## 2011-07-02 NOTE — Progress Notes (Signed)
  Subjective:    Patient ID: Russell Austin, male    DOB: [birth year], 60 y.o.   MRN: [id number]  HPI [name] is a 60 year old male, nonsmoker, who comes in today for annual physical examination because of a history of diabetes and blindness in his right eye.  Is currently on metformin 500 mg b.i.d. And glipizide 10 mg daily.  Random blood sugar was 299 however A1c was 7.3%.  He also takes lisinopril and add or thiazide daily for hypertension.  BP 140/80.  He gets routine eye checks by Dr. Zadie Rhine his ophthalmologist.  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  He gets regular dental care, colonoscopy last year, normal, tetanus booster 2011, seasonal flu shot today   Review of Systems  Constitutional: Negative.   HENT: Negative.   Eyes: Negative.   Respiratory: Negative.   Cardiovascular: Negative.   Gastrointestinal: Negative.   Genitourinary: Negative.   Musculoskeletal: Negative.   Skin: Negative.   Neurological: Negative.   Hematological: Negative.   Psychiatric/Behavioral: Negative.        Objective:   Physical Exam  Constitutional: He is oriented to person, place, and time. He appears well-developed and well-nourished.  HENT:  Head: Normocephalic and atraumatic.  Right Ear: External ear normal.  Left Ear: External ear normal.  Nose: Nose normal.  Mouth/Throat: Oropharynx is clear and moist.  Eyes: Conjunctivae and EOM are normal. Pupils are equal, round, and reactive to light.       Dense opaque right corneum.  No light perception, right eye, normal  Neck: Normal range of motion. Neck supple. No JVD present. No tracheal deviation present. No thyromegaly present.  Cardiovascular: Normal rate, regular rhythm, normal heart sounds and intact distal pulses.  Exam reveals no gallop and no friction rub.   No murmur heard. Pulmonary/Chest: Effort normal and breath sounds normal. No stridor. No  respiratory distress. He has no wheezes. He has no rales. He exhibits no tenderness.  Abdominal: Soft. Bowel sounds are normal. He exhibits no distension and no mass. There is no tenderness. There is no rebound and no guarding.  Genitourinary: Rectum normal, prostate normal and penis normal. Guaiac negative stool. No penile tenderness.       Mass left scrotum referred to urology  Musculoskeletal: Normal range of motion. He exhibits no edema and no tenderness.  Lymphadenopathy:    He has no cervical adenopathy.  Neurological: He is alert and oriented to person, place, and time. He has normal reflexes. No cranial nerve deficit. He exhibits normal muscle tone.  Skin: Skin is warm and dry. No rash noted. No erythema. No pallor.  Psychiatric: He has a normal mood and affect. His behavior is normal. Judgment and thought content normal.          Assessment & Plan:  Healthy male.  Diabetes type II not at goal increase metformin to 1000 mg in the morning 500 prior to evening meal.  Continue glipizide 10 mg daily A1c in 3 months.  Hypertension.  Continue lisinopril and hydrochlorothiazide.  Blindness right eye.  Mass scrotum, refer to urology

## 2011-07-03 NOTE — Telephone Encounter (Signed)
Spoke with patient.

## 2011-09-18 ENCOUNTER — Other Ambulatory Visit: Payer: BC Managed Care – PPO

## 2011-09-25 ENCOUNTER — Ambulatory Visit: Payer: BC Managed Care – PPO | Admitting: Family Medicine

## 2012-02-26 ENCOUNTER — Other Ambulatory Visit: Payer: Self-pay | Admitting: Urology

## 2012-03-26 ENCOUNTER — Encounter (HOSPITAL_BASED_OUTPATIENT_CLINIC_OR_DEPARTMENT_OTHER): Payer: Self-pay | Admitting: *Deleted

## 2012-03-26 NOTE — Progress Notes (Signed)
NPO AFTER MN. ARRIVES AT 0845. NEEDS ISTAT. CURRENT EKG IN EPIC AND CHART.

## 2012-03-31 ENCOUNTER — Encounter (HOSPITAL_BASED_OUTPATIENT_CLINIC_OR_DEPARTMENT_OTHER): Payer: Self-pay | Admitting: Anesthesiology

## 2012-03-31 ENCOUNTER — Ambulatory Visit (HOSPITAL_BASED_OUTPATIENT_CLINIC_OR_DEPARTMENT_OTHER)
Admission: RE | Admit: 2012-03-31 | Discharge: 2012-03-31 | Disposition: A | Discharge status: Home / Self Care | Payer: BC Managed Care – PPO | Source: Ambulatory Visit | Attending: Urology | Admitting: Urology

## 2012-03-31 ENCOUNTER — Encounter (HOSPITAL_BASED_OUTPATIENT_CLINIC_OR_DEPARTMENT_OTHER): Payer: Self-pay | Admitting: *Deleted

## 2012-03-31 ENCOUNTER — Ambulatory Visit (HOSPITAL_BASED_OUTPATIENT_CLINIC_OR_DEPARTMENT_OTHER): Payer: BC Managed Care – PPO | Admitting: Anesthesiology

## 2012-03-31 ENCOUNTER — Encounter (HOSPITAL_BASED_OUTPATIENT_CLINIC_OR_DEPARTMENT_OTHER)
Admission: RE | Disposition: A | Discharge status: Home / Self Care | Payer: Self-pay | Source: Ambulatory Visit | Attending: Urology

## 2012-03-31 DIAGNOSIS — E119 Type 2 diabetes mellitus without complications: Secondary | ICD-10-CM | POA: Insufficient documentation

## 2012-03-31 DIAGNOSIS — N432 Other hydrocele: Secondary | ICD-10-CM | POA: Insufficient documentation

## 2012-03-31 DIAGNOSIS — I10 Essential (primary) hypertension: Secondary | ICD-10-CM | POA: Insufficient documentation

## 2012-03-31 HISTORY — DX: Hydrocele, unspecified: N43.3

## 2012-03-31 HISTORY — PX: HYDROCELE EXCISION: SHX482

## 2012-03-31 HISTORY — DX: Legal blindness, as defined in USA: H54.8

## 2012-03-31 HISTORY — DX: Unspecified glaucoma: H40.9

## 2012-03-31 LAB — GLUCOSE, CAPILLARY

## 2012-03-31 LAB — POCT I-STAT 4, (NA,K, GLUC, HGB,HCT)
Glucose, Bld: 212 mg/dL — ABNORMAL HIGH (ref 70–99)
Potassium: 3.8 mEq/L (ref 3.5–5.1)

## 2012-03-31 SURGERY — HYDROCELECTOMY
Anesthesia: General | Site: Scrotum | Laterality: Left | Wound class: Clean Contaminated

## 2012-03-31 MED ORDER — PROMETHAZINE HCL 25 MG/ML IJ SOLN: 6.2500 mg | INTRAMUSCULAR | Status: DC | PRN

## 2012-03-31 MED ORDER — CEPHALEXIN 250 MG PO CAPS: 250.0000 mg | ORAL_CAPSULE | Freq: Four times a day (QID) | ORAL | Status: AC

## 2012-03-31 MED ORDER — LACTATED RINGERS IV SOLN: INTRAVENOUS | Status: DC

## 2012-03-31 MED ORDER — MIDAZOLAM HCL 5 MG/5ML IJ SOLN
INTRAMUSCULAR | Status: DC | PRN
  Administered 2012-03-31: 2 mg via INTRAVENOUS

## 2012-03-31 MED ORDER — BUPIVACAINE HCL (PF) 0.25 % IJ SOLN
INTRAMUSCULAR | Status: DC | PRN
  Administered 2012-03-31: 18 mL

## 2012-03-31 MED ORDER — LIDOCAINE HCL (CARDIAC) 20 MG/ML IV SOLN
INTRAVENOUS | Status: DC | PRN
  Administered 2012-03-31: 60 mg via INTRAVENOUS

## 2012-03-31 MED ORDER — FENTANYL CITRATE 0.05 MG/ML IJ SOLN
25.0000 ug | INTRAMUSCULAR | Status: DC | PRN
  Administered 2012-03-31: 25 ug via INTRAVENOUS

## 2012-03-31 MED ORDER — CEFAZOLIN SODIUM-DEXTROSE 2-3 GM-% IV SOLR
2.0000 g | INTRAVENOUS | Status: AC
  Administered 2012-03-31: 2 g via INTRAVENOUS

## 2012-03-31 MED ORDER — FENTANYL CITRATE 0.05 MG/ML IJ SOLN
INTRAMUSCULAR | Status: DC | PRN
  Administered 2012-03-31: 50 ug via INTRAVENOUS
  Administered 2012-03-31: 25 ug via INTRAVENOUS

## 2012-03-31 MED ORDER — ONDANSETRON HCL 4 MG/2ML IJ SOLN
INTRAMUSCULAR | Status: DC | PRN
  Administered 2012-03-31: 4 mg via INTRAVENOUS

## 2012-03-31 MED ORDER — LACTATED RINGERS IV SOLN
INTRAVENOUS | Status: DC
  Administered 2012-03-31 (×2): via INTRAVENOUS
  Administered 2012-03-31: 100 mL/h via INTRAVENOUS

## 2012-03-31 MED ORDER — HYDROCODONE-ACETAMINOPHEN 5-325 MG PO TABS
1.0000 | ORAL_TABLET | ORAL | Status: DC | PRN
  Administered 2012-03-31: 1 via ORAL

## 2012-03-31 MED ORDER — HYDROCODONE-ACETAMINOPHEN 5-500 MG PO CAPS: 1.0000 | ORAL_CAPSULE | ORAL | Status: AC | PRN

## 2012-03-31 MED ORDER — CEFAZOLIN SODIUM 1-5 GM-% IV SOLN: 1.0000 g | INTRAVENOUS | Status: DC

## 2012-03-31 MED ORDER — MEPERIDINE HCL 25 MG/ML IJ SOLN: 6.2500 mg | INTRAMUSCULAR | Status: DC | PRN

## 2012-03-31 MED ORDER — PROPOFOL 10 MG/ML IV EMUL
INTRAVENOUS | Status: DC | PRN
  Administered 2012-03-31: 200 mg via INTRAVENOUS

## 2012-03-31 SURGICAL SUPPLY — 41 items
BANDAGE GAUZE ELAST BULKY 4 IN (GAUZE/BANDAGES/DRESSINGS) ×2 IMPLANT
BLADE SURG 15 STRL LF DISP TIS (BLADE) ×1 IMPLANT
BLADE SURG 15 STRL SS (BLADE) ×2
BLADE SURG ROTATE 9660 (MISCELLANEOUS) ×1 IMPLANT
CANISTER SUCTION 1200CC (MISCELLANEOUS) IMPLANT
CANISTER SUCTION 2500CC (MISCELLANEOUS) ×1 IMPLANT
CLEANER CAUTERY TIP 5X5 PAD (MISCELLANEOUS) IMPLANT
CLOTH BEACON ORANGE TIMEOUT ST (SAFETY) ×2 IMPLANT
COVER MAYO STAND STRL (DRAPES) ×2 IMPLANT
COVER TABLE BACK 60X90 (DRAPES) ×2 IMPLANT
DISSECTOR ROUND CHERRY 3/8 STR (MISCELLANEOUS) IMPLANT
DRAIN PENROSE 18X1/4 LTX STRL (WOUND CARE) ×1 IMPLANT
DRAPE PED LAPAROTOMY (DRAPES) ×2 IMPLANT
ELECT NDL TIP 2.8 STRL (NEEDLE) IMPLANT
ELECT NEEDLE TIP 2.8 STRL (NEEDLE) ×2 IMPLANT
ELECT REM PT RETURN 9FT ADLT (ELECTROSURGICAL) ×2
ELECTRODE REM PT RTRN 9FT ADLT (ELECTROSURGICAL) IMPLANT
GLOVE BIO SURGEON STRL SZ8 (GLOVE) ×2 IMPLANT
GLOVE ECLIPSE 6.0 STRL STRAW (GLOVE) ×1 IMPLANT
GLOVE INDICATOR 6.5 STRL GRN (GLOVE) ×1 IMPLANT
GOWN STRL REIN XL XLG (GOWN DISPOSABLE) ×2 IMPLANT
GOWN W/COTTON TOWEL STD LRG (GOWNS) ×2 IMPLANT
GOWN XL W/COTTON TOWEL STD (GOWNS) ×2 IMPLANT
NEEDLE HYPO 22GX1.5 SAFETY (NEEDLE) ×1 IMPLANT
NS IRRIG 500ML POUR BTL (IV SOLUTION) ×2 IMPLANT
PACK BASIN DAY SURGERY FS (CUSTOM PROCEDURE TRAY) ×2 IMPLANT
PAD CLEANER CAUTERY TIP 5X5 (MISCELLANEOUS) ×1
PENCIL BUTTON HOLSTER BLD 10FT (ELECTRODE) ×2 IMPLANT
SPONGE GAUZE 4X4 12PLY (GAUZE/BANDAGES/DRESSINGS) ×1 IMPLANT
SUPPORT SCROTAL LG STRP (MISCELLANEOUS) ×2 IMPLANT
SUPPORT SCROTAL MED ADLT STRP (MISCELLANEOUS) ×1 IMPLANT
SUT CHROMIC 2 0 SH (SUTURE) IMPLANT
SUT CHROMIC 3 0 SH 27 (SUTURE) ×2 IMPLANT
SUT CHROMIC 4 0 SH 27 (SUTURE) IMPLANT
SUT MNCRL AB 3-0 PS2 18 (SUTURE) ×1 IMPLANT
SYR CONTROL 10ML LL (SYRINGE) ×1 IMPLANT
TOWEL OR 17X24 6PK STRL BLUE (TOWEL DISPOSABLE) ×4 IMPLANT
TRAY DSU PREP LF (CUSTOM PROCEDURE TRAY) ×2 IMPLANT
TUBE CONNECTING 12X1/4 (SUCTIONS) ×2 IMPLANT
WATER STERILE IRR 500ML POUR (IV SOLUTION) ×2 IMPLANT
YANKAUER SUCT BULB TIP NO VENT (SUCTIONS) ×2 IMPLANT

## 2012-03-31 NOTE — Transfer of Care (Signed)
Immediate Anesthesia Transfer of Care Note  Patient: Russell Austin  Procedure(s) Performed: Procedure(s) (LRB): HYDROCELECTOMY ADULT (Left)  Patient Location: PACU  Anesthesia Type: General  Level of Consciousness: sedated and patient cooperative  Airway & Oxygen Therapy: Patient Spontanous Breathing and Patient connected to face mask oxygen  Post-op Assessment: Report given to PACU RN and Post -op Vital signs reviewed and stable  Post vital signs: Reviewed and stable  Complications: No apparent anesthesia complications

## 2012-03-31 NOTE — Op Note (Signed)
Preoperative diagnosis: Left hydrocele   Postoperative diagnosis: Same   Procedure:Left hydrocele repair   Surgeon: Lillette Boxer. Shawneequa Baldridge, M.D.   Anesthesia: Gen.   Indications: Patient presented with scrotal swelling. A hydrocele was confirmed with scrotal ultrasonography. The patient was symptomatic from his hydrocele and requested surgical intervention. He appeared to understand the risks benefits potential complications of this procedure.   Procedure: The patient was properly identified and marked in the holding room. He received preoperative IV antibiotics. He was then taken to the operating room where general anesthetic was administered using the LMA. The scrotum was then prepped and draped in the usual manner. Appropriate surgical timeout was performed. An incision was made in the median raphae of the scrotum. The hydrocele was encountered which was freed from the scrotal wall and then the testis and hydrocele sac were delivered from the incision. The hydrocele sac was then incised anteriorly and a large amount of fluid was obtained. The testis was carefully inspected and no other pathology was appreciated. The hydrocele sac was moderate in thickness.  The sac was then plicated posteriorly with a running 3-0 chromic suture. The spermatic cord block was then performed with quarter percent Marcaine. The testis was returned to the hemiscrotum taking great care to make sure that there was no twisting of the spermatic cord. Inspection of the inside of the scrotum revealed no significant bleeding. A quarter-inch Penrose was brought through the lower aspect of the scrotum into the affected hemiscrotum and sutured to the skin. The scrotal incision was then closed anatomically, first with a running 3-0 chromic reapproximating the dartos fascia, and then a 4-0 Monocryl used to close the skin in a simple running fashion. A fluff dressing and jockstrap was then placed Sponge and needle counts were correct. No  obvious complications occurred and the patient was brought to recovery room in stable condition.

## 2012-03-31 NOTE — Anesthesia Procedure Notes (Signed)
Procedure Name: LMA Insertion Date/Time: 03/31/2012 9:58 AM Performed by: Lyndee Leo Pre-anesthesia Checklist: Patient identified, Emergency Drugs available, Suction available and Patient being monitored Patient Re-evaluated:Patient Re-evaluated prior to inductionOxygen Delivery Method: Circle System Utilized Preoxygenation: Pre-oxygenation with 100% oxygen Intubation Type: IV induction Ventilation: Mask ventilation without difficulty LMA: LMA inserted LMA Size: 5.0 Number of attempts: 1 Airway Equipment and Method: bite block Placement Confirmation: positive ETCO2 Tube secured with: Tape Dental Injury: Teeth and Oropharynx as per pre-operative assessment

## 2012-03-31 NOTE — Anesthesia Preprocedure Evaluation (Signed)
Anesthesia Evaluation  Patient identified by MRN, date of birth, ID band Patient awake    Reviewed: Allergy & Precautions, H&P , NPO status , Patient's Chart, lab work & pertinent test results  Airway Mallampati: II TM Distance: >3 FB Neck ROM: Full    Dental No notable dental hx.    Pulmonary neg pulmonary ROS,  breath sounds clear to auscultation  Pulmonary exam normal       Cardiovascular hypertension, Pt. on medications negative cardio ROS  Rhythm:Regular Rate:Normal     Neuro/Psych blind R eye negative neurological ROS  negative psych ROS   GI/Hepatic negative GI ROS, Neg liver ROS,   Endo/Other  negative endocrine ROSType 2, Oral Hypoglycemic Agents  Renal/GU negative Renal ROS  negative genitourinary   Musculoskeletal negative musculoskeletal ROS (+)   Abdominal   Peds negative pediatric ROS (+)  Hematology negative hematology ROS (+)   Anesthesia Other Findings Opacified blind R eye  Reproductive/Obstetrics negative OB ROS                           Anesthesia Physical Anesthesia Plan  ASA: II  Anesthesia Plan: General   Post-op Pain Management:    Induction: Intravenous  Airway Management Planned: LMA  Additional Equipment:   Intra-op Plan:   Post-operative Plan: Extubation in OR  Informed Consent: I have reviewed the patients History and Physical, chart, labs and discussed the procedure including the risks, benefits and alternatives for the proposed anesthesia with the patient or authorized representative who has indicated his/her understanding and acceptance.   Dental advisory given  Plan Discussed with: CRNA  Anesthesia Plan Comments:         Anesthesia Quick Evaluation

## 2012-03-31 NOTE — H&P (Signed)
  Urology History and Physical Exam  CC: left scrotal swelling  HPI: 61 year old male who presents at this time for definitive management of a symptomatic left hydrocele. He first presented in 2012, referred by Dr. Christie Nottingham for erectile dysfunction. He was having difficulty at that time with a left hydrocele as well. 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 He was seen in June of 2013 with increasing symptoms of this hydrocele. He requested surgical management. He presents at this time for that procedure.  PMH: Past Medical History  Diagnosis Date  . Diabetes mellitus type II   . Hypertension   . ED (erectile dysfunction)   . Diabetic retinopathy FOLLOWED BY DR Zadie Rhine  . 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 Glaucoma of both eyes   . Left hydrocele     PSH: Past Surgical History  Procedure Date  . Undescended right testicle removed 1994  . Right eye vitrectomy/ insertion glaucoma seton/ laser repair 12-13-2008    RETINAL ARTERY OCCLUSION /NEOVASCULAR GLAUCOMA/ HEMORRHAGE  . Right eye vitretomy/ insertion glaucoma seton x2/ laser 03-24-2009    RECURRENT HEMORRHAGE/ OCCLUSION INTERNAL SETON  . Left eye laser retina repair SEPT 2012  . Appendectomy AGE EARLY 20'S    Allergies: Allergies  Allergen Reactions  . Apple Pectin (Pectin) Itching    ITCHY THROAT  . Peach (Prunus Persica) Itching    ITCHY THROAT    Medications: No prescriptions prior to admission     Social History: History   Social History  . Marital Status: Married    Spouse Name: N/A    Number of Children: N/A  . Years of Education: N/A   Occupational History  . Not on file.   Social History Main Topics  . Smoking status: Never Smoker   . Smokeless tobacco: Never Used  . Alcohol Use: No  . Drug Use: No  . Sexually Active:    Other Topics Concern  . Not on file   Social History Narrative  . No narrative on file    Family  History: Family History  Problem Relation Age of Onset  . Glaucoma Mother     Review of Systems: Positive: scrotal swelling, erectile dysfunction Negative:    A further 10 point review of systems was negative except what is listed in the HPI.  Physical Exam: @VITALS2 @ General: No acute distress.  Awake. Head:  Normocephalic.  Atraumatic. ENT:  EOMI.  Mucous membranes moist Neck:  Supple.  No lymphadenopathy. CV:  S1 present. S2 present. Regular rate. Pulmonary: Equal effort bilaterally.  Clear to auscultation bilaterally. Abdomen: Soft. none tender to palpation. Skin:  Normal turgor.  No visible rash. Extremity: No gross deformity of bilateral upper extremities.  No gross deformity of    bilateral lower extremities. Neurologic: Alert. Appropriate mood.  Penis:    No lesions. Urethra:  No Foley catheter in place.  Orthotopic meatus. Scrotum: No lesions.  No ecchymosis.  No erythema. Testicles: Right testicle absent. Left is nonpalpable.  No masses bilaterally. There is a left hydrocele   Studies:  No results found for this basename: HGB:2,WBC:2,PLT:2 in the last 72 hours  No results found for this basename: NA:2,K:2,CL:2,CO2:2,BUN:2,CREATININE:2,CALCIUM:2,MAGNESIUM:2,GFRNONAA:2,GFRAA:2 in the last 72 hours   No results found for this basename: PT:2,INR:2,APTT:2 in the last 72 hours   No components found with this basename: ABG:2    Assessment:  Symptomatic left hydrocele  Plan: Left hydrocelectomy

## 2012-03-31 NOTE — Anesthesia Postprocedure Evaluation (Signed)
  Anesthesia Post-op Note  Patient: Russell Austin  Procedure(s) Performed: Procedure(s) (LRB): HYDROCELECTOMY ADULT (Left)  Patient Location: PACU  Anesthesia Type: General  Level of Consciousness: awake and alert   Airway and Oxygen Therapy: Patient Spontanous Breathing  Post-op Pain: mild  Post-op Assessment: Post-op Vital signs reviewed, Patient's Cardiovascular Status Stable, Respiratory Function Stable, Patent Airway and No signs of Nausea or vomiting  Post-op Vital Signs: stable  Complications: No apparent anesthesia complications

## 2012-04-01 ENCOUNTER — Encounter (HOSPITAL_BASED_OUTPATIENT_CLINIC_OR_DEPARTMENT_OTHER): Payer: Self-pay | Admitting: Urology

## 2012-05-09 ENCOUNTER — Telehealth: Payer: Self-pay | Admitting: Family Medicine

## 2012-05-09 MED ORDER — GLUCOSE BLOOD VI STRP: ORAL_STRIP | Status: DC

## 2012-05-09 NOTE — Telephone Encounter (Signed)
[facility] called and is req refill of Free Style Lite Test #100 for 30 day supply. Fax # [phone].

## 2012-05-15 ENCOUNTER — Telehealth: Payer: Self-pay | Admitting: Family Medicine

## 2012-05-15 NOTE — Telephone Encounter (Signed)
[facility] called re: getting refills of glucose blood (FREESTYLE LITE) test strip. Pls fax script for [place] Fax # [phone].

## 2012-05-20 NOTE — Telephone Encounter (Signed)
Spoke with patient and he will call back to inform where to send test strips

## 2012-06-25 ENCOUNTER — Encounter: Payer: Self-pay | Admitting: Family Medicine

## 2012-07-17 ENCOUNTER — Ambulatory Visit (INDEPENDENT_AMBULATORY_CARE_PROVIDER_SITE_OTHER): Payer: BC Managed Care – PPO | Admitting: Family Medicine

## 2012-07-17 ENCOUNTER — Encounter: Payer: Self-pay | Admitting: Family Medicine

## 2012-07-17 VITALS — BP 170/90 | Temp 98.1°F | Wt 185.0 lb

## 2012-07-17 DIAGNOSIS — Z23 Encounter for immunization: Secondary | ICD-10-CM

## 2012-07-17 DIAGNOSIS — M25519 Pain in unspecified shoulder: Secondary | ICD-10-CM

## 2012-07-17 DIAGNOSIS — M722 Plantar fascial fibromatosis: Secondary | ICD-10-CM

## 2012-07-17 DIAGNOSIS — I10 Essential (primary) hypertension: Secondary | ICD-10-CM

## 2012-07-17 DIAGNOSIS — E119 Type 2 diabetes mellitus without complications: Secondary | ICD-10-CM

## 2012-07-17 DIAGNOSIS — M25512 Pain in left shoulder: Secondary | ICD-10-CM

## 2012-07-17 LAB — HEMOGLOBIN A1C: Hgb A1c MFr Bld: 7 % — ABNORMAL HIGH (ref 4.6–6.5)

## 2012-07-17 LAB — BASIC METABOLIC PANEL
Chloride: 101 mEq/L (ref 96–112)
Potassium: 4.6 mEq/L (ref 3.5–5.1)
Sodium: 138 mEq/L (ref 135–145)

## 2012-07-17 LAB — MICROALBUMIN / CREATININE URINE RATIO
Creatinine,U: 189.7 mg/dL
Microalb Creat Ratio: 26.4 mg/g (ref 0.0–30.0)
Microalb, Ur: 50 mg/dL — ABNORMAL HIGH (ref 0.0–1.9)

## 2012-07-17 NOTE — Patient Instructions (Signed)
Motrin 400 mg twice daily with food for shoulder pain  Tramadol 50 mg 1/2-1 tablet 3 times daily for mild to moderate pain  Labs today  I'll call you a report ASAP

## 2012-07-17 NOTE — Progress Notes (Signed)
  Subjective:    Patient ID: Russell Austin, male    DOB: 04/05/1951, 61 y.o.   MRN: [id number]  HPI  Russell Austin is a 61 year old male who comes today for evaluation of for problems  For the past week he said pain in his left posterior shoulder no history of trauma  Also for the past week he said pain in his right and left heels again no history of trauma  He's on glipizide 10 mg daily along with metformin 1000 mg in the morning and 500 mg prior to his evening meal. He's not been checking his blood sugars. Last A1c a year ago was 7.3%.  He takes lisinopril 20 mg daily along with hydrochlorothiazide for blood pressure control BP today 170/90.    Review of Systems Gen. orthopedic metabolic review of systems otherwise negative    Objective:   Physical Exam Well-developed well-nourished male in no acute distress  Examination of the left shoulder shows full range of motion no bony tenderness. There is palpable tenderness in the posterior shoulder muscles  Examination of the feet shows the pulses to be normal skin appears normal there is tenderness in right and left heels consistent with plantar fasciitis     Assessment & Plan:  Pain left shoulder muscular treat symptomatically with tramadol 50 mg one half tab twice a day when necessary Motrin 400 mg twice a day  Plantar fasciitis Motrin as above elevation ice and stretching  Diabetes type 2 check labs  Hypertension not at goal plan BP check daily followup in 2 weeks

## 2012-07-24 ENCOUNTER — Ambulatory Visit: Payer: BC Managed Care – PPO | Admitting: Family Medicine

## 2012-07-29 ENCOUNTER — Ambulatory Visit: Payer: BC Managed Care – PPO | Admitting: Family Medicine

## 2012-09-23 ENCOUNTER — Other Ambulatory Visit: Payer: Self-pay | Admitting: Family Medicine

## 2012-09-23 ENCOUNTER — Other Ambulatory Visit: Payer: Self-pay | Admitting: *Deleted

## 2012-09-23 DIAGNOSIS — E119 Type 2 diabetes mellitus without complications: Secondary | ICD-10-CM

## 2012-09-23 MED ORDER — GLIPIZIDE ER 10 MG PO TB24: 10.0000 mg | ORAL_TABLET | Freq: Every day | ORAL | Status: DC

## 2012-09-23 MED ORDER — METFORMIN HCL 1000 MG PO TABS: ORAL_TABLET | ORAL | Status: DC

## 2012-10-30 ENCOUNTER — Encounter: Payer: Self-pay | Admitting: Family Medicine

## 2012-10-30 ENCOUNTER — Ambulatory Visit (INDEPENDENT_AMBULATORY_CARE_PROVIDER_SITE_OTHER): Payer: BC Managed Care – PPO | Admitting: Family Medicine

## 2012-10-30 VITALS — BP 140/90 | Temp 97.1°F | Wt 182.0 lb

## 2012-10-30 DIAGNOSIS — E119 Type 2 diabetes mellitus without complications: Secondary | ICD-10-CM

## 2012-10-30 DIAGNOSIS — R319 Hematuria, unspecified: Secondary | ICD-10-CM

## 2012-10-30 DIAGNOSIS — M545 Low back pain, unspecified: Secondary | ICD-10-CM

## 2012-10-30 DIAGNOSIS — M549 Dorsalgia, unspecified: Secondary | ICD-10-CM

## 2012-10-30 DIAGNOSIS — R809 Proteinuria, unspecified: Secondary | ICD-10-CM | POA: Insufficient documentation

## 2012-10-30 LAB — BASIC METABOLIC PANEL
CO2: 29 mEq/L (ref 19–32)
Chloride: 103 mEq/L (ref 96–112)
Creatinine, Ser: 1.1 mg/dL (ref 0.4–1.5)

## 2012-10-30 LAB — POCT URINALYSIS DIPSTICK
Ketones, UA: NEGATIVE
Leukocytes, UA: NEGATIVE
Nitrite, UA: NEGATIVE
Protein, UA: 30
pH, UA: 6

## 2012-10-30 LAB — MICROALBUMIN / CREATININE URINE RATIO: Microalb Creat Ratio: 20.1 mg/g (ref 0.0–30.0)

## 2012-10-30 MED ORDER — GLIPIZIDE ER 10 MG PO TB24: 10.0000 mg | ORAL_TABLET | Freq: Every day | ORAL | Status: DC

## 2012-10-30 MED ORDER — DOXYCYCLINE HYCLATE 100 MG PO TABS: 100.0000 mg | ORAL_TABLET | Freq: Two times a day (BID) | ORAL | Status: DC

## 2012-10-30 NOTE — Patient Instructions (Addendum)
Take the doxycycline one twice daily for 3 weeks  Return in 5 weeks for followup  Labs today to check on blood sugar  Motrin 400 mg twice daily when necessary for back pain  We will also arrange for a 24-hour urine to measure the amount of protein urine  I will call you next week with your lab work reports

## 2012-10-30 NOTE — Progress Notes (Signed)
  Subjective:    Patient ID: Russell Austin, male    DOB: 1951-08-10, 62 y.o.   MRN: [id number]  HPI Parmvir is a 62 year old male who comes in today for evaluation of back pain  He states about 5 days ago on Monday midafternoon he was sitting went to stand up and experienced some left-sided back pain. He states the pain comes and goes it lasts for a few minutes and goes away it only occurs about once a day. He as no fever chills nausea vomiting diarrhea or urinary tract problems. He has a diabetic with severe retinopathy and blind in his right eye. His last A1c in November was normal 7.0% however his blood sugar was elevated and he had marked proteinuria. Blood pressure today normal he states his fasting sugars at home have risen into the 170 range.  He's had a urologic evaluation the past by Dr. Sherren Mocha. He's had a hydrocele repair and erectile dysfunction. None of the medications work. He declines the injections.   Review of Systems Review of systems otherwise negative    Objective:   Physical Exam Well-developed well-nourished male in no acute distress examination of the spine is normal no palpable tenderness. No palpable tenderness the back full range of motion  Urinalysis normal except for small amount of blood protein 30 mcg       Assessment & Plan:  Left low back pain question etiology plan trial of doxycycline 100 twice a day for 3 weeks followup urine in 5 weeks  Recheck A1c because of abnormal blood sugar

## 2012-11-04 LAB — PROTEIN, URINE, 24 HOUR: Protein, 24H Urine: 1050 mg/d — ABNORMAL HIGH (ref 50–100)

## 2012-11-08 NOTE — Progress Notes (Signed)
Quick Note:  Called and spoke with pt and pt is aware. ______ 

## 2012-12-18 ENCOUNTER — Ambulatory Visit: Payer: BC Managed Care – PPO | Admitting: Family Medicine

## 2013-01-08 ENCOUNTER — Other Ambulatory Visit: Payer: Self-pay | Admitting: *Deleted

## 2013-01-08 MED ORDER — HYDROCHLOROTHIAZIDE 25 MG PO TABS: ORAL_TABLET | ORAL | Status: DC

## 2013-01-13 ENCOUNTER — Other Ambulatory Visit: Payer: Self-pay | Admitting: *Deleted

## 2013-01-13 MED ORDER — HYDROCHLOROTHIAZIDE 25 MG PO TABS: ORAL_TABLET | ORAL | Status: DC

## 2013-02-02 ENCOUNTER — Ambulatory Visit: Payer: BC Managed Care – PPO | Admitting: Family Medicine

## 2013-05-06 ENCOUNTER — Other Ambulatory Visit: Payer: Self-pay | Admitting: *Deleted

## 2013-05-06 DIAGNOSIS — E119 Type 2 diabetes mellitus without complications: Secondary | ICD-10-CM

## 2013-05-06 MED ORDER — METFORMIN HCL 1000 MG PO TABS: ORAL_TABLET | ORAL | Status: DC

## 2013-05-13 ENCOUNTER — Encounter: Payer: Self-pay | Admitting: Family Medicine

## 2013-05-13 ENCOUNTER — Ambulatory Visit (INDEPENDENT_AMBULATORY_CARE_PROVIDER_SITE_OTHER): Payer: BC Managed Care – PPO | Admitting: Family Medicine

## 2013-05-13 VITALS — BP 120/82 | HR 80 | Temp 97.8°F | Wt 183.0 lb

## 2013-05-13 DIAGNOSIS — Z23 Encounter for immunization: Secondary | ICD-10-CM

## 2013-05-13 DIAGNOSIS — E119 Type 2 diabetes mellitus without complications: Secondary | ICD-10-CM

## 2013-05-13 DIAGNOSIS — J329 Chronic sinusitis, unspecified: Secondary | ICD-10-CM

## 2013-05-13 MED ORDER — AMOXICILLIN 875 MG PO TABS: 875.0000 mg | ORAL_TABLET | Freq: Two times a day (BID) | ORAL | Status: DC

## 2013-05-13 MED ORDER — HYDROCODONE-HOMATROPINE 5-1.5 MG/5ML PO SYRP: 5.0000 mL | ORAL_SOLUTION | Freq: Three times a day (TID) | ORAL | Status: DC | PRN

## 2013-05-13 NOTE — Progress Notes (Signed)
Chief Complaint  Patient presents with  . Cough    x 1 month     HPI:  62 yo M patient of Dr. Sherren Mocha with PMH sig for asthma here for acute visit for:  1)Cough: -started several weeks to a month ago, drainage, nasal congestion - light green, R max sinus pain, a little wheezy at times - has improved some but symptoms still lingering -denies: fevers, chills, DOE, CP, NVD -has tried nyquil, tussin, Administrator -hx of sinusitis  Chronic:  Doesn't come in often. Las HgbA1c in 10/2012 7.5 and told to schedule 3 month follow up. Takes metformin 1000mg  am and 500pm (but often forgets evening); also take glipizide but misses doses. Does not get any exercise and doesn't work on diet much.  ROS: See pertinent positives and negatives per HPI.  Past Medical History  Diagnosis Date  . Diabetes mellitus type II   . Hypertension   . ED (erectile dysfunction)   . Diabetic retinopathy FOLLOWED BY DR Zadie Rhine  . 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 Glaucoma of both eyes   . Left hydrocele     Past Surgical History  Procedure Laterality Date  . Undescended right testicle removed  1994  . Right eye vitrectomy/ insertion glaucoma seton/ laser repair  12-13-2008    RETINAL ARTERY OCCLUSION /NEOVASCULAR GLAUCOMA/ HEMORRHAGE  . Right eye vitretomy/ insertion glaucoma seton x2/ laser  03-24-2009    RECURRENT HEMORRHAGE/ OCCLUSION INTERNAL SETON  . Left eye laser retina repair  SEPT 2012  . Appendectomy  AGE EARLY 20'S  . Hydrocele excision  03/31/2012    Procedure: HYDROCELECTOMY ADULT;  Surgeon: Franchot Gallo, MD;  Location: [facility];  Service: Urology;  Laterality: Left;  82 MINS      Family History  Problem Relation Age of Onset  . Glaucoma Mother     History   Social History  . Marital Status: Married    Spouse Name: N/A    Number of Children: N/A  . Years of Education: N/A   Social History Main Topics  . Smoking status: Never Smoker   .  Smokeless tobacco: Never Used  . Alcohol Use: No  . Drug Use: No  . Sexual Activity:    Other Topics Concern  . None   Social History Narrative  . None    Current outpatient prescriptions:b complex vitamins tablet, Take 1 tablet by mouth daily. , Disp: , Rfl: ;  glipiZIDE (GLUCOTROL XL) 10 MG 24 hr tablet, Take 1 tablet (10 mg total) by mouth daily., Disp: 90 tablet, Rfl: 3;  hydrochlorothiazide (HYDRODIURIL) 25 MG tablet, TAKE ONE TABLET BY MOUTH EVERY DAY, Disp: 100 tablet, Rfl: 3;  lisinopril (PRINIVIL,ZESTRIL) 20 MG tablet, TAKE ONE TABLET BY MOUTH EVERY DAY, Disp: 100 tablet, Rfl: 2 metFORMIN (GLUCOPHAGE) 1000 MG tablet, One tablet in the morning and half a tablet prior to evening meal, Disp: 150 tablet, Rfl: 5;  vardenafil (LEVITRA) 20 MG tablet, Take 20 mg by mouth as needed., Disp: , Rfl: ;  amoxicillin (AMOXIL) 875 MG tablet, Take 1 tablet (875 mg total) by mouth 2 (two) times daily., Disp: 20 tablet, Rfl: 0 HYDROcodone-homatropine (HYCODAN) 5-1.5 MG/5ML syrup, Take 5 mLs by mouth every 8 (eight) hours as needed for cough., Disp: 120 mL, Rfl: 0  EXAM:  Filed Vitals:   05/13/13 0809  BP: 120/82  Pulse: 80  Temp: 97.8 F (36.6 C)    Body mass index is 27.01 kg/(m^2).  GENERAL:  vitals reviewed and listed above, alert, oriented, appears well hydrated and in no acute distress  HEENT: atraumatic, conjunttiva clear, no obvious abnormalities on inspection of external nose and ears  NECK: no obvious masses on inspection  LUNGS: clear to auscultation bilaterally, no wheezes, rales or rhonchi, good air movement  CV: HRRR, no peripheral edema  MS: moves all extremities without noticeable abnormality  PSYCH: pleasant and cooperative, no obvious depression or anxiety  ASSESSMENT AND PLAN:  Discussed the following assessment and plan:  Need for prophylactic vaccination and inoculation against influenza - Plan: Flu Vaccine QUAD 36+ mos PF IM (Fluarix)  DIABETES MELLITUS, TYPE  II  Sinusitis - Plan: HYDROcodone-homatropine (HYCODAN) 5-1.5 MG/5ML syrup, amoxicillin (AMOXIL) 875 MG tablet  -tx for sinusitis Recommendations per orders and instructions, risks and use of medications and return precautions discussed. -discussed his diabetes, he is missing doses of medications - advised pill box and taking all medications dialy -advised increased exercise and healthy diet -follow up with PCP in 1 month or sooner if concerns -Patient advised to return or notify a doctor immediately if symptoms worsen or persist or new concerns arise.  There are no Patient Instructions on file for this visit.   Colin Benton R.

## 2013-06-15 ENCOUNTER — Ambulatory Visit: Payer: BC Managed Care – PPO | Admitting: Family Medicine

## 2013-06-19 ENCOUNTER — Telehealth: Payer: Self-pay | Admitting: Family Medicine

## 2013-06-19 MED ORDER — GLIPIZIDE ER 10 MG PO TB24: 10.0000 mg | ORAL_TABLET | Freq: Every day | ORAL | Status: DC

## 2013-06-19 NOTE — Telephone Encounter (Signed)
Rx sent to pharmacy   

## 2013-06-19 NOTE — Telephone Encounter (Signed)
Pt request refill of glipiZIDE (GLUCOTROL XL) 10 MG 24 hr tablet 90 day/ 1 x day Pt is out of meds. Pharm: [facility]

## 2013-07-29 ENCOUNTER — Encounter: Payer: Self-pay | Admitting: Family Medicine

## 2013-08-06 ENCOUNTER — Ambulatory Visit: Payer: BC Managed Care – PPO | Admitting: Family Medicine

## 2013-12-16 ENCOUNTER — Other Ambulatory Visit (INDEPENDENT_AMBULATORY_CARE_PROVIDER_SITE_OTHER): Payer: BC Managed Care – PPO

## 2013-12-16 DIAGNOSIS — Z0000 Encounter for general adult medical examination without abnormal findings: Secondary | ICD-10-CM

## 2013-12-16 LAB — POCT URINALYSIS DIPSTICK
BILIRUBIN UA: NEGATIVE
GLUCOSE UA: NEGATIVE
KETONES UA: NEGATIVE
LEUKOCYTES UA: NEGATIVE
NITRITE UA: NEGATIVE
PH UA: 6
Spec Grav, UA: 1.01
Urobilinogen, UA: 0.2

## 2013-12-16 LAB — BASIC METABOLIC PANEL
BUN: 15 mg/dL (ref 6–23)
CALCIUM: 9.2 mg/dL (ref 8.4–10.5)
CO2: 30 meq/L (ref 19–32)
CREATININE: 1.2 mg/dL (ref 0.4–1.5)
Chloride: 104 mEq/L (ref 96–112)
GFR: 81.16 mL/min (ref >60.00)
GLUCOSE: 104 mg/dL — AB (ref 70–99)
Potassium: 4.8 mEq/L (ref 3.5–5.1)
SODIUM: 139 meq/L (ref 135–145)

## 2013-12-16 LAB — CBC WITH DIFFERENTIAL/PLATELET
BASOS ABS: 0 10*3/uL (ref 0.0–0.1)
Basophils Relative: 0.6 % (ref 0.0–3.0)
EOS ABS: 0.3 10*3/uL (ref 0.0–0.7)
Eosinophils Relative: 7.3 % — ABNORMAL HIGH (ref 0.0–5.0)
HCT: 35.6 % — ABNORMAL LOW (ref 39.0–52.0)
Hemoglobin: 11.9 g/dL — ABNORMAL LOW (ref 13.0–17.0)
LYMPHS PCT: 44.7 % (ref 12.0–46.0)
Lymphs Abs: 1.9 10*3/uL (ref 0.7–4.0)
MCHC: 33.3 g/dL (ref 30.0–36.0)
MCV: 89.3 fl (ref 78.0–100.0)
MONOS PCT: 6.6 % (ref 3.0–12.0)
Monocytes Absolute: 0.3 10*3/uL (ref 0.1–1.0)
NEUTROS PCT: 40.8 % — AB (ref 43.0–77.0)
Neutro Abs: 1.8 10*3/uL (ref 1.4–7.7)
Platelets: 279 10*3/uL (ref 150.0–400.0)
RBC: 3.99 Mil/uL — ABNORMAL LOW (ref 4.22–5.81)
RDW: 13.7 % (ref 11.5–14.6)
WBC: 4.3 10*3/uL — ABNORMAL LOW (ref 4.5–10.5)

## 2013-12-16 LAB — HEPATIC FUNCTION PANEL
ALBUMIN: 3.7 g/dL (ref 3.5–5.2)
ALK PHOS: 71 U/L (ref 39–117)
ALT: 18 U/L (ref 0–53)
AST: 24 U/L (ref 0–37)
BILIRUBIN DIRECT: 0.1 mg/dL (ref 0.0–0.3)
BILIRUBIN TOTAL: 1.2 mg/dL (ref 0.3–1.2)
Total Protein: 6.4 g/dL (ref 6.0–8.3)

## 2013-12-16 LAB — LIPID PANEL
Cholesterol: 156 mg/dL (ref 0–200)
HDL: 33.3 mg/dL — ABNORMAL LOW (ref >39.00)
LDL Cholesterol: 103 mg/dL — ABNORMAL HIGH (ref 0–99)
Total CHOL/HDL Ratio: 5
Triglycerides: 97 mg/dL (ref 0.0–149.0)
VLDL: 19.4 mg/dL (ref 0.0–40.0)

## 2013-12-16 LAB — MICROALBUMIN / CREATININE URINE RATIO
Creatinine,U: 58 mg/dL
MICROALB UR: 27.7 mg/dL — AB (ref 0.0–1.9)
MICROALB/CREAT RATIO: 47.8 mg/g — AB (ref 0.0–30.0)

## 2013-12-16 LAB — HEMOGLOBIN A1C: Hgb A1c MFr Bld: 7.8 % — ABNORMAL HIGH (ref 4.6–6.5)

## 2013-12-16 LAB — TSH: TSH: 1.41 u[IU]/mL (ref 0.35–5.50)

## 2013-12-16 LAB — PSA: PSA: 0.5 ng/mL (ref 0.10–4.00)

## 2013-12-24 ENCOUNTER — Telehealth: Payer: Self-pay | Admitting: Family Medicine

## 2013-12-24 ENCOUNTER — Encounter: Payer: Self-pay | Admitting: Family Medicine

## 2013-12-24 ENCOUNTER — Ambulatory Visit (INDEPENDENT_AMBULATORY_CARE_PROVIDER_SITE_OTHER): Payer: BC Managed Care – PPO | Admitting: Family Medicine

## 2013-12-24 VITALS — BP 148/90 | Temp 98.1°F | Ht 67.0 in | Wt 184.0 lb

## 2013-12-24 DIAGNOSIS — E119 Type 2 diabetes mellitus without complications: Secondary | ICD-10-CM

## 2013-12-24 DIAGNOSIS — E1149 Type 2 diabetes mellitus with other diabetic neurological complication: Secondary | ICD-10-CM

## 2013-12-24 DIAGNOSIS — H547 Unspecified visual loss: Secondary | ICD-10-CM

## 2013-12-24 DIAGNOSIS — R319 Hematuria, unspecified: Secondary | ICD-10-CM

## 2013-12-24 DIAGNOSIS — F528 Other sexual dysfunction not due to a substance or known physiological condition: Secondary | ICD-10-CM

## 2013-12-24 DIAGNOSIS — R809 Proteinuria, unspecified: Secondary | ICD-10-CM

## 2013-12-24 DIAGNOSIS — I10 Essential (primary) hypertension: Secondary | ICD-10-CM

## 2013-12-24 MED ORDER — METFORMIN HCL 1000 MG PO TABS: ORAL_TABLET | ORAL | Status: DC

## 2013-12-24 MED ORDER — HYDROCHLOROTHIAZIDE 25 MG PO TABS: ORAL_TABLET | ORAL | Status: DC

## 2013-12-24 MED ORDER — LISINOPRIL 20 MG PO TABS: ORAL_TABLET | ORAL | Status: DC

## 2013-12-24 MED ORDER — GLIPIZIDE ER 10 MG PO TB24: 10.0000 mg | ORAL_TABLET | Freq: Every day | ORAL | Status: DC

## 2013-12-24 NOTE — Progress Notes (Signed)
Pre visit review using our clinic review tool, if applicable. No additional management support is needed unless otherwise documented below in the visit note. 

## 2013-12-24 NOTE — Telephone Encounter (Signed)
Noted by Dr Sherren Mocha

## 2013-12-24 NOTE — Progress Notes (Signed)
   Subjective:    Patient ID: Russell Austin, male    DOB: 1950/09/24, 63 y.o.   MRN: [id number]  HPI  Russell Austin is a 63 year old married male nonsmoker who comes in today for general physical examination  He has diabetes and takes glipizide 10 mg in the morning and metformin 1000 mg before breakfast and 500 prior to his evening meal. A1c is up to 7.8%. He follows a diet and he walks daily  He takes hydrochlorothiazide and lisinopril for hypertension he's been off his medicine for 2 weeks because he ran out  He does not request a refill of Levitra  He gets routine eye care......Marland Kitchen blind in his right eye from acute glaucoma...Marland KitchenMarland KitchenMarland Kitchen regular dental care colonoscopy and GI vaccinations updated by Russell Austin  Because of blindness in his eye is not able to drive anymore. He was a Administrator by trade. He and his wife have adopted 2 children ages now 45    Review of Systems  Constitutional: Negative.   HENT: Negative.   Eyes: Negative.   Respiratory: Negative.   Cardiovascular: Negative.   Gastrointestinal: Negative.   Genitourinary: Negative.   Musculoskeletal: Negative.   Skin: Negative.   Neurological: Negative.   Psychiatric/Behavioral: Negative.        Objective:   Physical Exam  Nursing note and vitals reviewed. Constitutional: He is oriented to person, place, and time. He appears well-developed and well-nourished. No distress.  HENT:  Head: Normocephalic and atraumatic.  Right Ear: External ear normal.  Left Ear: External ear normal.  Nose: Nose normal.  Mouth/Throat: Oropharynx is clear and moist.  Eyes: Conjunctivae and EOM are normal. Pupils are equal, round, and reactive to light. Right eye exhibits no discharge. Left eye exhibits no discharge. No scleral icterus.  Blind right eye  Neck: Normal range of motion. Neck supple. No JVD present. No tracheal deviation present. No thyromegaly present.  Cardiovascular: Normal rate, regular rhythm, normal heart sounds and intact  distal pulses.  Exam reveals no gallop and no friction rub.   No murmur heard. No carotid nor aortic bruits peripheral pulses 2+ and symmetrical  Pulmonary/Chest: Effort normal and breath sounds normal. No stridor. No respiratory distress. He has no wheezes. He has no rales. He exhibits no tenderness.  Abdominal: Soft. Bowel sounds are normal. He exhibits no distension and no mass. There is no tenderness. There is no rebound and no guarding.  Genitourinary: Rectum normal, prostate normal and penis normal. Guaiac negative stool. No penile tenderness.  Musculoskeletal: Normal range of motion. He exhibits no edema and no tenderness.  Lymphadenopathy:    He has no cervical adenopathy.  Neurological: He is alert and oriented to person, place, and time. He has normal reflexes. No cranial nerve deficit. He exhibits normal muscle tone.  Skin: Skin is warm and dry. No rash noted. He is not diaphoretic. No erythema. No pallor.  Psychiatric: He has a normal mood and affect. His behavior is normal. Judgment and thought content normal.          Assessment & Plan:  Diabetes type 2 not at goal increase metformin 1000 mg twice a day continue glipizide 10 daily followup in 3 months  Hypertension not at goal because he's been off his medicine for 2 weeks. 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

## 2013-12-24 NOTE — Telephone Encounter (Signed)
Pt wife called she feels husband is depressed and would like for Dr Sherren Mocha to talk with him about it today at his 2pm appointment

## 2013-12-24 NOTE — Patient Instructions (Signed)
Increase the metformin,,,,,,,,,, one twice daily  Continue glipizide 10 mg one tablet in the morning  Followup in 3 months  Nonfasting labs one week prior

## 2013-12-25 ENCOUNTER — Telehealth: Payer: Self-pay | Admitting: Family Medicine

## 2013-12-25 NOTE — Telephone Encounter (Signed)
Relevant patient education assigned to patient using Emmi. ° °

## 2014-01-01 ENCOUNTER — Telehealth: Payer: Self-pay

## 2014-01-01 NOTE — Telephone Encounter (Signed)
Relevant patient education assigned to patient using Emmi. ° °

## 2014-03-11 ENCOUNTER — Other Ambulatory Visit (INDEPENDENT_AMBULATORY_CARE_PROVIDER_SITE_OTHER): Payer: BC Managed Care – PPO

## 2014-03-11 DIAGNOSIS — E119 Type 2 diabetes mellitus without complications: Secondary | ICD-10-CM

## 2014-03-11 LAB — BASIC METABOLIC PANEL
BUN: 24 mg/dL — ABNORMAL HIGH (ref 6–23)
CALCIUM: 9.2 mg/dL (ref 8.4–10.5)
CO2: 28 mEq/L (ref 19–32)
Chloride: 107 mEq/L (ref 96–112)
Creatinine, Ser: 1.3 mg/dL (ref 0.4–1.5)
GFR: 74.45 mL/min (ref >60.00)
Glucose, Bld: 101 mg/dL — ABNORMAL HIGH (ref 70–99)
Potassium: 4.3 mEq/L (ref 3.5–5.1)
Sodium: 141 mEq/L (ref 135–145)

## 2014-03-11 LAB — HEMOGLOBIN A1C: HEMOGLOBIN A1C: 7.2 % — AB (ref 4.6–6.5)

## 2014-03-18 ENCOUNTER — Ambulatory Visit (INDEPENDENT_AMBULATORY_CARE_PROVIDER_SITE_OTHER): Payer: BC Managed Care – PPO | Admitting: Family Medicine

## 2014-03-18 ENCOUNTER — Encounter: Payer: Self-pay | Admitting: Family Medicine

## 2014-03-18 VITALS — BP 140/90 | Temp 98.0°F | Wt 184.0 lb

## 2014-03-18 DIAGNOSIS — E119 Type 2 diabetes mellitus without complications: Secondary | ICD-10-CM

## 2014-03-18 DIAGNOSIS — I10 Essential (primary) hypertension: Secondary | ICD-10-CM

## 2014-03-18 NOTE — Progress Notes (Signed)
Pre visit review using our clinic review tool, if applicable. No additional management support is needed unless otherwise documented below in the visit note. 

## 2014-03-18 NOTE — Patient Instructions (Addendum)
Continue the current therapy for your diabetes  Check a BP daily in the morning for 3 weeks. Blood pressure goal 135/85 or less. If not at goal call and leave a voicemail with Russell Austin and we will readjust her medication  If your blood pressure is at goal then going forward just check it once weekly  Return April 2016 for annual exam  Labs fasting one week prior

## 2014-03-18 NOTE — Progress Notes (Signed)
   Subjective:    Patient ID: Russell Austin, male    DOB: 1951-01-13, 63 y.o.   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 We didn't change his medicine. We kept him on the glipizide 10 mg daily and metformin 1000 mg twice a day. He altered his diet and increase his exercise. Now his A1c is down to 7.2%.  BP today by Apolonio Schneiders right arm sitting position 140/90   Review of Systems Review of systems otherwise negative complete physical exam April of this year    Objective:   Physical Exam  Well-developed well-nourished male no acute distress vital signs stable is afebrile BP again 140/90 right arm sitting position      Assessment & Plan:  Diabetes type 2 at goal,,,,,,,,,,,,, continue current therapy return in April for annual followup  Hypertension question at goal BP 140/90.......... BP check every morning at home for 3 weeks call if blood pressure elevated to adjust his medication,,,,

## 2014-04-05 LAB — HM DIABETES EYE EXAM

## 2014-04-08 ENCOUNTER — Encounter: Payer: Self-pay | Admitting: Family Medicine

## 2014-11-23 ENCOUNTER — Encounter: Payer: Self-pay | Admitting: Internal Medicine

## 2014-12-13 ENCOUNTER — Other Ambulatory Visit (INDEPENDENT_AMBULATORY_CARE_PROVIDER_SITE_OTHER): Payer: BC Managed Care – PPO

## 2014-12-13 DIAGNOSIS — E119 Type 2 diabetes mellitus without complications: Secondary | ICD-10-CM

## 2014-12-13 DIAGNOSIS — I10 Essential (primary) hypertension: Secondary | ICD-10-CM | POA: Diagnosis not present

## 2014-12-13 LAB — CBC WITH DIFFERENTIAL/PLATELET
BASOS PCT: 0.6 % (ref 0.0–3.0)
Basophils Absolute: 0 10*3/uL (ref 0.0–0.1)
Eosinophils Absolute: 0.2 10*3/uL (ref 0.0–0.7)
Eosinophils Relative: 4.6 % (ref 0.0–5.0)
HCT: 33.7 % — ABNORMAL LOW (ref 39.0–52.0)
Hemoglobin: 11.6 g/dL — ABNORMAL LOW (ref 13.0–17.0)
LYMPHS PCT: 42.1 % (ref 12.0–46.0)
Lymphs Abs: 1.7 10*3/uL (ref 0.7–4.0)
MCHC: 34.4 g/dL (ref 30.0–36.0)
MCV: 88.1 fl (ref 78.0–100.0)
Monocytes Absolute: 0.2 10*3/uL (ref 0.1–1.0)
Monocytes Relative: 5.7 % (ref 3.0–12.0)
NEUTROS ABS: 1.9 10*3/uL (ref 1.4–7.7)
Neutrophils Relative %: 47 % (ref 43.0–77.0)
PLATELETS: 297 10*3/uL (ref 150.0–400.0)
RBC: 3.82 Mil/uL — ABNORMAL LOW (ref 4.22–5.81)
RDW: 13.2 % (ref 11.5–15.5)
WBC: 4.1 10*3/uL (ref 4.0–10.5)

## 2014-12-13 LAB — LIPID PANEL
CHOL/HDL RATIO: 5
Cholesterol: 148 mg/dL (ref 0–200)
HDL: 31.6 mg/dL — AB (ref >39.00)
LDL CALC: 92 mg/dL (ref 0–99)
NonHDL: 116.4
TRIGLYCERIDES: 124 mg/dL (ref 0.0–149.0)
VLDL: 24.8 mg/dL (ref 0.0–40.0)

## 2014-12-13 LAB — MICROALBUMIN / CREATININE URINE RATIO
Creatinine,U: 153.9 mg/dL
MICROALB UR: 50.8 mg/dL — AB (ref 0.0–1.9)
MICROALB/CREAT RATIO: 33 mg/g — AB (ref 0.0–30.0)

## 2014-12-13 LAB — PSA: PSA: 0.45 ng/mL (ref 0.10–4.00)

## 2014-12-13 LAB — POCT URINALYSIS DIPSTICK
Bilirubin, UA: NEGATIVE
GLUCOSE UA: NEGATIVE
Ketones, UA: NEGATIVE
LEUKOCYTES UA: NEGATIVE
NITRITE UA: NEGATIVE
Spec Grav, UA: 1.02
UROBILINOGEN UA: 1
pH, UA: 5

## 2014-12-13 LAB — HEPATIC FUNCTION PANEL
ALBUMIN: 3.6 g/dL (ref 3.5–5.2)
ALT: 16 U/L (ref 0–53)
AST: 19 U/L (ref 0–37)
Alkaline Phosphatase: 66 U/L (ref 39–117)
Bilirubin, Direct: 0.2 mg/dL (ref 0.0–0.3)
TOTAL PROTEIN: 6.1 g/dL (ref 6.0–8.3)
Total Bilirubin: 0.8 mg/dL (ref 0.2–1.2)

## 2014-12-13 LAB — BASIC METABOLIC PANEL
BUN: 28 mg/dL — ABNORMAL HIGH (ref 6–23)
CALCIUM: 9.2 mg/dL (ref 8.4–10.5)
CO2: 29 mEq/L (ref 19–32)
CREATININE: 1.34 mg/dL (ref 0.40–1.50)
Chloride: 104 mEq/L (ref 96–112)
GFR: 69.17 mL/min (ref >60.00)
Glucose, Bld: 108 mg/dL — ABNORMAL HIGH (ref 70–99)
Potassium: 4.3 mEq/L (ref 3.5–5.1)
Sodium: 139 mEq/L (ref 135–145)

## 2014-12-13 LAB — TSH: TSH: 1.58 u[IU]/mL (ref 0.35–4.50)

## 2014-12-13 LAB — HEMOGLOBIN A1C: Hgb A1c MFr Bld: 7.6 % — ABNORMAL HIGH (ref 4.6–6.5)

## 2014-12-20 ENCOUNTER — Ambulatory Visit (INDEPENDENT_AMBULATORY_CARE_PROVIDER_SITE_OTHER): Payer: BC Managed Care – PPO | Admitting: Family Medicine

## 2014-12-20 ENCOUNTER — Encounter: Payer: Self-pay | Admitting: Family Medicine

## 2014-12-20 VITALS — BP 160/80 | Temp 97.7°F | Ht 68.0 in | Wt 182.0 lb

## 2014-12-20 DIAGNOSIS — R809 Proteinuria, unspecified: Secondary | ICD-10-CM

## 2014-12-20 DIAGNOSIS — H5441 Blindness, right eye, normal vision left eye: Secondary | ICD-10-CM

## 2014-12-20 DIAGNOSIS — I10 Essential (primary) hypertension: Secondary | ICD-10-CM

## 2014-12-20 DIAGNOSIS — H5440 Blindness, one eye, unspecified eye: Secondary | ICD-10-CM

## 2014-12-20 DIAGNOSIS — Z23 Encounter for immunization: Secondary | ICD-10-CM | POA: Diagnosis not present

## 2014-12-20 DIAGNOSIS — E139 Other specified diabetes mellitus without complications: Secondary | ICD-10-CM | POA: Diagnosis not present

## 2014-12-20 DIAGNOSIS — R319 Hematuria, unspecified: Secondary | ICD-10-CM | POA: Diagnosis not present

## 2014-12-20 DIAGNOSIS — Z0000 Encounter for general adult medical examination without abnormal findings: Secondary | ICD-10-CM | POA: Diagnosis not present

## 2014-12-20 LAB — POCT URINALYSIS DIPSTICK
BILIRUBIN UA: NEGATIVE
Glucose, UA: NEGATIVE
Ketones, UA: NEGATIVE
Leukocytes, UA: NEGATIVE
Nitrite, UA: NEGATIVE
PROTEIN UA: 100
Spec Grav, UA: 1.02
Urobilinogen, UA: 0.2
pH, UA: 6

## 2014-12-20 MED ORDER — LISINOPRIL 40 MG PO TABS: 40.0000 mg | ORAL_TABLET | Freq: Every day | ORAL | Status: DC

## 2014-12-20 MED ORDER — HYDROCHLOROTHIAZIDE 25 MG PO TABS: ORAL_TABLET | ORAL | Status: DC

## 2014-12-20 MED ORDER — METFORMIN HCL 1000 MG PO TABS: ORAL_TABLET | ORAL | Status: DC

## 2014-12-20 MED ORDER — GLIPIZIDE ER 10 MG PO TB24
10.0000 mg | ORAL_TABLET | Freq: Every day | ORAL | Status: DC
Start: 2014-12-20 — End: 2015-01-10

## 2014-12-20 NOTE — Progress Notes (Signed)
Pre visit review using our clinic review tool, if applicable. No additional management support is needed unless otherwise documented below in the visit note. 

## 2014-12-20 NOTE — Progress Notes (Signed)
Subjective:    Patient ID: Russell Austin, male    DOB: [birth year], 64 y.o.   MRN: [id number]  HPI [name] is a delightful 64 year old married male nonsmoker who comes in today for general physical examination because of a history of diabetes, hypertension, hematuria, status post right testicle removed for torsion, proteinuria secondary to diabetes and blindness right eye secondary to acute glaucoma  His blood sugar was 108 A1c 7.6% on metformin 1000 mg twice a day and Glucotrol 10 mg daily. He states he's working around the house but is really not exercising on a daily basis.  Blood pressure today 160/90. Recently his blood pressures been creeping up at home. He checked a couple days ago was 143/90. We'll increase his lisinopril to 30 mg daily  His LDLs cholesterol is normal  He has a history of hematuria and has had evaluation by Dr. Francella Solian at the urology center. He also had his right testicle removed years ago for torsion.  He gets his left eye checked every 6 months by his ophthalmologist,,,,,, blind right eye from acute glaucoma,,,, irregular dental care, colonoscopy 2006 due for repeat this year.  Vaccinations updated by Apolonio Schneiders  No neuropathy   Review of Systems  Constitutional: Negative.   HENT: Negative.   Eyes: Negative.   Respiratory: Negative.   Cardiovascular: Negative.   Gastrointestinal: Negative.   Endocrine: Negative.   Genitourinary: Negative.   Musculoskeletal: Negative.   Skin: Negative.   Allergic/Immunologic: Negative.   Neurological: Negative.   Hematological: Negative.   Psychiatric/Behavioral: Negative.        Objective:   Physical Exam  Constitutional: He is oriented to person, place, and time. He appears well-developed and well-nourished.  HENT:  Head: Normocephalic and atraumatic.  Right Ear: External ear normal.  Left Ear: External ear normal.  Nose: Nose normal.  Mouth/Throat: Oropharynx is clear and moist.  Blind right eye no light  perception...Marland KitchenMarland KitchenMarland Kitchen left eye normal except for an early cataract  Eyes: Conjunctivae and EOM are normal. Pupils are equal, round, and reactive to light.  Neck: Normal range of motion. Neck supple. No JVD present. No tracheal deviation present. No thyromegaly present.  Cardiovascular: Normal rate, regular rhythm, normal heart sounds and intact distal pulses.  Exam reveals no gallop and no friction rub.   No murmur heard. No carotid nor aortic bruits peripheral pulses 2+ and symmetrical  Pulmonary/Chest: Effort normal and breath sounds normal. No stridor. No respiratory distress. He has no wheezes. He has no rales. He exhibits no tenderness.  Abdominal: Soft. Bowel sounds are normal. He exhibits no distension and no mass. There is no tenderness. There is no rebound and no guarding.  Genitourinary: Rectum normal, prostate normal and penis normal. Guaiac negative stool. No penile tenderness.  Left testicle normal right was removed for torsion  Musculoskeletal: Normal range of motion. He exhibits no edema or tenderness.  Lymphadenopathy:    He has no cervical adenopathy.  Neurological: He is alert and oriented to person, place, and time. He has normal reflexes. No cranial nerve deficit. He exhibits normal muscle tone.  No neuropathy  Skin: Skin is warm and dry. No rash noted. No erythema. No pallor.  Psychiatric: He has a normal mood and affect. His behavior is normal. Judgment and thought content normal.  Nursing note and vitals reviewed.         Assessment & Plan:  Diabetes type 2 not at goal..... Continue current medications...Marland KitchenMarland KitchenMarland Kitchen walk 30 minutes daily..... Follow-up A1c in September  Hypertension not at goal........ increase lisinopril to 30 mg daily...Marland KitchenMarland KitchenMarland Kitchen BP check daily in the morning for 4 weeks....... if blood pressure not at goal after 4 weeks then increase to 40 mg daily.  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

## 2014-12-20 NOTE — Patient Instructions (Signed)
Increase the lisinopril to 40 mg daily....... check your blood pressure daily in the morning for 1 month.....Marland Kitchen blood pressure goal 135/85 or less.......... if not at goal call and we will increase your medication....... if it drops to normal then continue that dose  Walk 30 minutes daily......... continue current diabetic medications.....Marland Kitchen follow-up A1c in September

## 2015-01-04 LAB — HM DIABETES EYE EXAM

## 2015-01-10 ENCOUNTER — Telehealth: Payer: Self-pay | Admitting: Family Medicine

## 2015-01-10 DIAGNOSIS — I10 Essential (primary) hypertension: Secondary | ICD-10-CM

## 2015-01-10 DIAGNOSIS — E139 Other specified diabetes mellitus without complications: Secondary | ICD-10-CM

## 2015-01-10 MED ORDER — METFORMIN HCL 1000 MG PO TABS: ORAL_TABLET | ORAL | Status: DC

## 2015-01-10 MED ORDER — LISINOPRIL 40 MG PO TABS: 40.0000 mg | ORAL_TABLET | Freq: Every day | ORAL | Status: DC

## 2015-01-10 MED ORDER — HYDROCHLOROTHIAZIDE 25 MG PO TABS: ORAL_TABLET | ORAL | Status: DC

## 2015-01-10 MED ORDER — GLIPIZIDE ER 10 MG PO TB24: 10.0000 mg | ORAL_TABLET | Freq: Every day | ORAL | Status: DC

## 2015-01-10 NOTE — Telephone Encounter (Signed)
Rx sent 

## 2015-01-10 NOTE — Telephone Encounter (Signed)
Needs all four of his Rx's [facility]. He did not pick up the refills called in last week due to cost.

## 2015-01-24 ENCOUNTER — Other Ambulatory Visit: Payer: Self-pay | Admitting: Family Medicine

## 2015-02-09 ENCOUNTER — Emergency Department (HOSPITAL_COMMUNITY): Payer: BC Managed Care – PPO

## 2015-02-09 ENCOUNTER — Encounter (HOSPITAL_COMMUNITY): Payer: Self-pay | Admitting: *Deleted

## 2015-02-09 ENCOUNTER — Inpatient Hospital Stay (HOSPITAL_COMMUNITY): Payer: BC Managed Care – PPO

## 2015-02-09 ENCOUNTER — Inpatient Hospital Stay (HOSPITAL_COMMUNITY)
Admission: EM | Admit: 2015-02-09 | Discharge: 2015-02-14 | DRG: 065 | Disposition: A | Discharge status: Rehabilitation Facility | Payer: BC Managed Care – PPO | Attending: Internal Medicine | Admitting: Internal Medicine

## 2015-02-09 DIAGNOSIS — H409 Unspecified glaucoma: Secondary | ICD-10-CM | POA: Diagnosis present

## 2015-02-09 DIAGNOSIS — E11319 Type 2 diabetes mellitus with unspecified diabetic retinopathy without macular edema: Secondary | ICD-10-CM | POA: Diagnosis present

## 2015-02-09 DIAGNOSIS — G8191 Hemiplegia, unspecified affecting right dominant side: Secondary | ICD-10-CM | POA: Insufficient documentation

## 2015-02-09 DIAGNOSIS — N179 Acute kidney failure, unspecified: Secondary | ICD-10-CM | POA: Diagnosis not present

## 2015-02-09 DIAGNOSIS — H05401 Unspecified enophthalmos, right eye: Secondary | ICD-10-CM | POA: Diagnosis present

## 2015-02-09 DIAGNOSIS — I129 Hypertensive chronic kidney disease with stage 1 through stage 4 chronic kidney disease, or unspecified chronic kidney disease: Secondary | ICD-10-CM | POA: Diagnosis present

## 2015-02-09 DIAGNOSIS — E1122 Type 2 diabetes mellitus with diabetic chronic kidney disease: Secondary | ICD-10-CM | POA: Diagnosis present

## 2015-02-09 DIAGNOSIS — R1312 Dysphagia, oropharyngeal phase: Secondary | ICD-10-CM | POA: Diagnosis present

## 2015-02-09 DIAGNOSIS — M542 Cervicalgia: Secondary | ICD-10-CM | POA: Diagnosis present

## 2015-02-09 DIAGNOSIS — G629 Polyneuropathy, unspecified: Secondary | ICD-10-CM | POA: Diagnosis not present

## 2015-02-09 DIAGNOSIS — I6302 Cerebral infarction due to thrombosis of basilar artery: Principal | ICD-10-CM

## 2015-02-09 DIAGNOSIS — R531 Weakness: Secondary | ICD-10-CM

## 2015-02-09 DIAGNOSIS — E1140 Type 2 diabetes mellitus with diabetic neuropathy, unspecified: Secondary | ICD-10-CM | POA: Diagnosis not present

## 2015-02-09 DIAGNOSIS — I639 Cerebral infarction, unspecified: Secondary | ICD-10-CM | POA: Diagnosis not present

## 2015-02-09 DIAGNOSIS — N182 Chronic kidney disease, stage 2 (mild): Secondary | ICD-10-CM | POA: Diagnosis present

## 2015-02-09 DIAGNOSIS — Z91018 Allergy to other foods: Secondary | ICD-10-CM | POA: Diagnosis not present

## 2015-02-09 DIAGNOSIS — E1159 Type 2 diabetes mellitus with other circulatory complications: Secondary | ICD-10-CM | POA: Diagnosis not present

## 2015-02-09 DIAGNOSIS — R1314 Dysphagia, pharyngoesophageal phase: Secondary | ICD-10-CM | POA: Diagnosis present

## 2015-02-09 DIAGNOSIS — E1142 Type 2 diabetes mellitus with diabetic polyneuropathy: Secondary | ICD-10-CM | POA: Diagnosis present

## 2015-02-09 DIAGNOSIS — H5411 Blindness, right eye, low vision left eye: Secondary | ICD-10-CM | POA: Diagnosis present

## 2015-02-09 DIAGNOSIS — G8190 Hemiplegia, unspecified affecting unspecified side: Secondary | ICD-10-CM | POA: Diagnosis not present

## 2015-02-09 DIAGNOSIS — E118 Type 2 diabetes mellitus with unspecified complications: Secondary | ICD-10-CM | POA: Diagnosis not present

## 2015-02-09 DIAGNOSIS — D649 Anemia, unspecified: Secondary | ICD-10-CM | POA: Diagnosis present

## 2015-02-09 DIAGNOSIS — I10 Essential (primary) hypertension: Secondary | ICD-10-CM | POA: Diagnosis present

## 2015-02-09 DIAGNOSIS — Z4659 Encounter for fitting and adjustment of other gastrointestinal appliance and device: Secondary | ICD-10-CM

## 2015-02-09 DIAGNOSIS — N185 Chronic kidney disease, stage 5: Secondary | ICD-10-CM

## 2015-02-09 DIAGNOSIS — M6289 Other specified disorders of muscle: Secondary | ICD-10-CM | POA: Diagnosis not present

## 2015-02-09 DIAGNOSIS — E785 Hyperlipidemia, unspecified: Secondary | ICD-10-CM | POA: Diagnosis present

## 2015-02-09 DIAGNOSIS — I672 Cerebral atherosclerosis: Secondary | ICD-10-CM | POA: Diagnosis present

## 2015-02-09 DIAGNOSIS — N189 Chronic kidney disease, unspecified: Secondary | ICD-10-CM

## 2015-02-09 DIAGNOSIS — H5500 Unspecified nystagmus: Secondary | ICD-10-CM | POA: Diagnosis present

## 2015-02-09 DIAGNOSIS — I69351 Hemiplegia and hemiparesis following cerebral infarction affecting right dominant side: Secondary | ICD-10-CM | POA: Diagnosis not present

## 2015-02-09 DIAGNOSIS — E1165 Type 2 diabetes mellitus with hyperglycemia: Secondary | ICD-10-CM | POA: Diagnosis present

## 2015-02-09 DIAGNOSIS — R200 Anesthesia of skin: Secondary | ICD-10-CM | POA: Diagnosis present

## 2015-02-09 DIAGNOSIS — R202 Paresthesia of skin: Secondary | ICD-10-CM

## 2015-02-09 DIAGNOSIS — R29898 Other symptoms and signs involving the musculoskeletal system: Secondary | ICD-10-CM

## 2015-02-09 DIAGNOSIS — N183 Chronic kidney disease, stage 3 (moderate): Secondary | ICD-10-CM | POA: Diagnosis not present

## 2015-02-09 DIAGNOSIS — I6330 Cerebral infarction due to thrombosis of unspecified cerebral artery: Secondary | ICD-10-CM | POA: Diagnosis not present

## 2015-02-09 LAB — CBC
HCT: 37 % — ABNORMAL LOW (ref 39.0–52.0)
HEMOGLOBIN: 13 g/dL (ref 13.0–17.0)
MCH: 30 pg (ref 26.0–34.0)
MCHC: 35.1 g/dL (ref 30.0–36.0)
MCV: 85.5 fL (ref 78.0–100.0)
Platelets: 279 10*3/uL (ref 150–400)
RBC: 4.33 MIL/uL (ref 4.22–5.81)
RDW: 12.2 % (ref 11.5–15.5)
WBC: 4.7 10*3/uL (ref 4.0–10.5)

## 2015-02-09 LAB — COMPREHENSIVE METABOLIC PANEL
ALT: 22 U/L (ref 17–63)
AST: 26 U/L (ref 15–41)
Albumin: 3.8 g/dL (ref 3.5–5.0)
Alkaline Phosphatase: 81 U/L (ref 38–126)
Anion gap: 11 (ref 5–15)
BILIRUBIN TOTAL: 1.8 mg/dL — AB (ref 0.3–1.2)
BUN: 20 mg/dL (ref 6–20)
CO2: 22 mmol/L (ref 22–32)
Calcium: 8.9 mg/dL (ref 8.9–10.3)
Chloride: 103 mmol/L (ref 101–111)
Creatinine, Ser: 1.42 mg/dL — ABNORMAL HIGH (ref 0.61–1.24)
GFR calc Af Amer: 59 mL/min — ABNORMAL LOW (ref >60)
GFR calc non Af Amer: 51 mL/min — ABNORMAL LOW (ref >60)
GLUCOSE: 262 mg/dL — AB (ref 65–99)
POTASSIUM: 4.1 mmol/L (ref 3.5–5.1)
SODIUM: 136 mmol/L (ref 135–145)
TOTAL PROTEIN: 6.7 g/dL (ref 6.5–8.1)

## 2015-02-09 LAB — I-STAT TROPONIN, ED: Troponin i, poc: 0 ng/mL (ref 0.00–0.08)

## 2015-02-09 LAB — CBG MONITORING, ED: Glucose-Capillary: 246 mg/dL — ABNORMAL HIGH (ref 65–99)

## 2015-02-09 LAB — I-STAT CHEM 8, ED
BUN: 23 mg/dL — ABNORMAL HIGH (ref 6–20)
Calcium, Ion: 1.15 mmol/L (ref 1.13–1.30)
Chloride: 103 mmol/L (ref 101–111)
Creatinine, Ser: 1.3 mg/dL — ABNORMAL HIGH (ref 0.61–1.24)
Glucose, Bld: 261 mg/dL — ABNORMAL HIGH (ref 65–99)
HCT: 41 % (ref 39.0–52.0)
Hemoglobin: 13.9 g/dL (ref 13.0–17.0)
POTASSIUM: 4.1 mmol/L (ref 3.5–5.1)
Sodium: 138 mmol/L (ref 135–145)
TCO2: 19 mmol/L (ref 0–100)

## 2015-02-09 LAB — DIFFERENTIAL
Basophils Absolute: 0 10*3/uL (ref 0.0–0.1)
Basophils Relative: 0 % (ref 0–1)
EOS PCT: 1 % (ref 0–5)
Eosinophils Absolute: 0 10*3/uL (ref 0.0–0.7)
Lymphocytes Relative: 25 % (ref 12–46)
Lymphs Abs: 1.1 10*3/uL (ref 0.7–4.0)
MONOS PCT: 8 % (ref 3–12)
Monocytes Absolute: 0.4 10*3/uL (ref 0.1–1.0)
Neutro Abs: 3.1 10*3/uL (ref 1.7–7.7)
Neutrophils Relative %: 66 % (ref 43–77)

## 2015-02-09 LAB — APTT: aPTT: 31 seconds (ref 24–37)

## 2015-02-09 LAB — PROTIME-INR
INR: 1.05 (ref 0.00–1.49)
Prothrombin Time: 13.9 seconds (ref 11.6–15.2)

## 2015-02-09 MED ORDER — MORPHINE SULFATE 4 MG/ML IJ SOLN: 4.0000 mg | Freq: Once | INTRAMUSCULAR | Status: DC

## 2015-02-09 MED ORDER — ASPIRIN EC 81 MG PO TBEC
81.0000 mg | DELAYED_RELEASE_TABLET | Freq: Every day | ORAL | Status: DC
  Filled 2015-02-09 (×2): qty 1

## 2015-02-09 MED ORDER — SODIUM CHLORIDE 0.9 % IV BOLUS (SEPSIS)
1000.0000 mL | Freq: Once | INTRAVENOUS | Status: AC
  Administered 2015-02-09: 1000 mL via INTRAVENOUS

## 2015-02-09 NOTE — ED Notes (Signed)
Pt taken to MRI  

## 2015-02-09 NOTE — ED Provider Notes (Signed)
CSN: [id number]     Arrival date & time 02/09/15  3 History   First MD Initiated Contact with Patient 02/09/15 1601     Chief Complaint  Patient presents with  . Numbness     (Consider location/radiation/quality/duration/timing/severity/associated sxs/prior Treatment) HPI Patient presents to the emergency department with intermittent numbness in his right hand since yesterday afternoon and his arm.  The patient's also complaining of right-sided neck pain.  The patient also states this morning when he woke up, he started having difficulty walking with his right leg.  He felt tingling and difficulty with movement of the leg.  The patient states that he had a colonoscopy earlier today.  The patient states that he does not have any nausea, vomiting, as his headache, blurred vision, back pain, fever, cough, runny nose, sore throat, abdominal pain, or syncope.  The patient states that nothing seems make his condition, better or worse Past Medical History  Diagnosis Date  . Diabetes mellitus type II   . Hypertension   . ED (erectile dysfunction)   . Diabetic retinopathy FOLLOWED BY DR Zadie Rhine  . 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 Glaucoma of both eyes   . Left hydrocele    Past Surgical History  Procedure Laterality Date  . Undescended right testicle removed  1994  . Right eye vitrectomy/ insertion glaucoma seton/ laser repair  12-13-2008    RETINAL ARTERY OCCLUSION /NEOVASCULAR GLAUCOMA/ HEMORRHAGE  . Right eye vitretomy/ insertion glaucoma seton x2/ laser  03-24-2009    RECURRENT HEMORRHAGE/ OCCLUSION INTERNAL SETON  . Left eye laser retina repair  SEPT 2012  . Appendectomy  AGE EARLY 20'S  . Hydrocele excision  03/31/2012    Procedure: HYDROCELECTOMY ADULT;  Surgeon: Franchot Gallo, MD;  Location: [facility];  Service: Urology;  Laterality: Left;  9 MINS     Family History  Problem Relation Age of Onset  . Glaucoma Mother    History   Substance Use Topics  . Smoking status: Never Smoker   . Smokeless tobacco: Never Used  . Alcohol Use: No    Review of Systems All other systems negative except as documented in the HPI. All pertinent positives and negatives as reviewed in the HPI.    Allergies  Apple pectin and Peach  Home Medications   Prior to Admission medications   Medication Sig Start Date End Date Taking? Authorizing Provider  glipiZIDE (GLUCOTROL XL) 10 MG 24 hr tablet Take 1 tablet (10 mg total) by mouth daily. 01/10/15  Yes Dorena Cookey, MD  hydrochlorothiazide (HYDRODIURIL) 25 MG tablet TAKE ONE TABLET BY MOUTH EVERY DAY 01/10/15  Yes Dorena Cookey, MD  lisinopril (PRINIVIL,ZESTRIL) 40 MG tablet Take 1 tablet (40 mg total) by mouth daily. 01/10/15  Yes Dorena Cookey, MD  metFORMIN (GLUCOPHAGE) 1000 MG tablet Take 500 mg by mouth 2 (two) times daily with a meal.   Yes Historical Provider, MD  metFORMIN (GLUCOPHAGE) 1000 MG tablet 1 by mouth twice a day Patient not taking: Reported on 02/09/2015 01/10/15   Dorena Cookey, MD   BP 167/82 mmHg  Pulse 85  Temp(Src) 98.3 F (36.8 C) (Oral)  Resp 24  Ht 5\' 9"  (1.753 m)  Wt 156 lb 9.6 oz (71.033 kg)  BMI 23.12 kg/m2  SpO2 100% Physical Exam  Constitutional: He is oriented to person, place, and time. He appears well-developed and well-nourished. No distress.  HENT:  Head: Normocephalic and atraumatic.  Mouth/Throat: Oropharynx  is clear and moist.  Eyes: Pupils are equal, round, and reactive to light.  Neck: Normal range of motion. Neck supple.  Neurological: He is alert and oriented to person, place, and time. He has normal strength. No cranial nerve deficit or sensory deficit. He exhibits normal muscle tone. Gait abnormal. Coordination normal. GCS eye subscore is 4. GCS verbal subscore is 5. GCS motor subscore is 6.    ED Course  Procedures (including critical care time) Labs Review Labs Reviewed  CBC - Abnormal; Notable for the following:    HCT 37.0  (*)    All other components within normal limits  COMPREHENSIVE METABOLIC PANEL - Abnormal; Notable for the following:    Glucose, Bld 262 (*)    Creatinine, Ser 1.42 (*)    Total Bilirubin 1.8 (*)    GFR calc non Af Amer 51 (*)    GFR calc Af Amer 59 (*)    All other components within normal limits  I-STAT CHEM 8, ED - Abnormal; Notable for the following:    BUN 23 (*)    Creatinine, Ser 1.30 (*)    Glucose, Bld 261 (*)    All other components within normal limits  CBG MONITORING, ED - Abnormal; Notable for the following:    Glucose-Capillary 246 (*)    All other components within normal limits  PROTIME-INR  APTT  DIFFERENTIAL  I-STAT TROPOININ, ED    Imaging Review Ct Head (brain) Wo Contrast  02/09/2015   CLINICAL DATA:  Right hand numbness which started this morning. History of blindness. Colonoscopy same day.  EXAM: CT HEAD WITHOUT CONTRAST  TECHNIQUE: Contiguous axial images were obtained from the base of the skull through the vertex without intravenous contrast.  COMPARISON:  None.  FINDINGS: No acute intracranial hemorrhage. No focal mass lesion. No CT evidence of acute infarction. No midline shift or mass effect. No hydrocephalus. Basilar cisterns are patent.  There is mild cortical atrophy. There is mild periventricular white matter hypodensities.  Postsurgical change in the right globe. Paranasal sinuses and mastoid air cells are clear.  There is mucosal thickening in the maxillary sinuses, greater on the left. Frontal sinuses are clear.  IMPRESSION: 1. No acute intracranial findings. 2. Mild atrophy and white matter microvascular disease. 3. Maxillary sinus inflammation.   Electronically Signed   By: Suzy Bouchard M.D.   On: 02/09/2015 15:20   Ct Cervical Spine Wo Contrast  02/09/2015   CLINICAL DATA:  intermittent RIGHT hand numbness that began yesterday. Recent endoscopy.  EXAM: CT CERVICAL SPINE WITHOUT CONTRAST  TECHNIQUE: Multidetector CT imaging of the cervical spine  was performed without intravenous contrast. Multiplanar CT image reconstructions were also generated.  COMPARISON:  CT head earlier today.  FINDINGS: There is no visible cervical spine fracture, traumatic subluxation, prevertebral soft tissue swelling, or intraspinal hematoma. Mild disc space narrowing at C5-6 and C6-7. Multilevel uncinate spurring most pronounced at C6-7, LEFT. No worrisome osseous lesions. Cervicothoracic junction unremarkable. Moderate anterior spurring. Odontoid is intact. No neck masses. Carotid atherosclerosis. Lung apices clear.  IMPRESSION: Cervical spondylosis as described; no acute features.   Electronically Signed   By: Rolla Flatten M.D.   On: 02/09/2015 17:44      There is a concern that the patient has had a stroke.  He will be admitted to the hospital.  Patient is advised return here as needed.  Dalia Heading, PA-C 02/11/15 [phone]

## 2015-02-09 NOTE — Consult Note (Signed)
Referring Physician: ED    Chief Complaint: right sided paresthesias, right leg weakness  HPI:                                                                                                                                         Russell Austin is an 64 y.o. male with a past medical history significant for HTN, DM type 2, diabetic retinopathy, glaucoma, right eye blindness due to glaucoma, presents to the ED for further evaluation of the above stated symptoms. Patient stated that he started noticing intermittent " numbness, pins and needles, and heaviness" of his right arm and leg yesterday evening. Then, had an uneventful colonoscopy this morning realiozed that he was having problem standing and walking due to right leg weakness.  Denies associated HA, vertigo, double vision,  slurred speech, language or vision impairment but complains of difficulty swallowing since being in the ED.  CT brain done in he ED was personally reviewed and showed no acute abnormality. 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 MRA brain without large vessel flow-limiting stenosis or occlusion.  Date last known well: 02/08/15 Time last known well: uncertain tPA Given: no, out of the window    Past Medical History  Diagnosis Date  . Diabetes mellitus type II   . Hypertension   . ED (erectile dysfunction)   . Diabetic retinopathy FOLLOWED BY DR Zadie Rhine  . 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 Glaucoma of both eyes   . Left hydrocele     Past Surgical History  Procedure Laterality Date  . Undescended right testicle removed  1994  . Right eye vitrectomy/ insertion glaucoma seton/ laser repair  12-13-2008    RETINAL ARTERY OCCLUSION /NEOVASCULAR GLAUCOMA/ HEMORRHAGE  . Right eye vitretomy/ insertion glaucoma seton x2/ laser  03-24-2009    RECURRENT HEMORRHAGE/ OCCLUSION INTERNAL SETON  .  Left eye laser retina repair  SEPT 2012  . Appendectomy  AGE EARLY 20'S  . Hydrocele excision  03/31/2012    Procedure: HYDROCELECTOMY ADULT;  Surgeon: Franchot Gallo, MD;  Location: [facility];  Service: Urology;  Laterality: Left;  83 MINS      Family History  Problem Relation Age of Onset  . Glaucoma Mother    Social History:  reports that he has never smoked. He has never used smokeless tobacco. He reports that he does not drink alcohol or use illicit drugs. Family history: no epilepsy, brain tumors, MS, or brain aneurysms. Allergies:  Allergies  Allergen Reactions  . Apple Pectin [Pectin] Itching    ITCHY THROAT  . Peach [Prunus Persica] Itching    ITCHY THROAT    Medications:  I have reviewed the patient's current medications.  ROS:                                                                                                                                       History obtained from chart review and the patient  General ROS: negative for - chills, fatigue, fever, night sweats, weight gain or weight loss Psychological ROS: negative for - behavioral disorder, hallucinations, memory difficulties, mood swings or suicidal ideation Ophthalmic ROS: negative for - blurry vision, double vision, eye pain or loss of vision ENT ROS: negative for - epistaxis, nasal discharge, oral lesions, sore throat, tinnitus or vertigo Allergy and Immunology ROS: negative for - hives or itchy/watery eyes Hematological and Lymphatic ROS: negative for - bleeding problems, bruising or swollen lymph nodes Endocrine ROS: negative for - galactorrhea, hair pattern changes, polydipsia/polyuria or temperature intolerance Respiratory ROS: negative for - cough, hemoptysis, shortness of breath or wheezing Cardiovascular ROS: negative for - chest pain, dyspnea on exertion,  edema or irregular heartbeat Gastrointestinal ROS: negative for - abdominal pain, diarrhea, hematemesis, nausea/vomiting or stool incontinence Genito-Urinary ROS: negative for - dysuria, hematuria, incontinence or urinary frequency/urgency Musculoskeletal ROS: negative for - joint swelling Neurological ROS: as noted in HPI Dermatological ROS: negative for rash and skin lesion changes   Physical exam: pleasant male in no apparent distress. Blood pressure 161/86, pulse 93, temperature 98.3 F (36.8 C), temperature source Oral, resp. rate 17, height 5' 9"  (1.753 m), weight 71.033 kg (156 lb 9.6 oz), SpO2 99 %. Head: normocephalic. Neck: supple, no bruits, no JVD. Cardiac: no murmurs. Lungs: clear. Abdomen: soft, no tender, no mass. Extremities: no edema. Skin: no rash Neurologic Examination:                                                                                                      General: Mental Status: Alert, oriented, thought content appropriate.  Speech fluent without evidence of aphasia.  Able to follow 3 step commands without difficulty. Cranial Nerves: II: Discs flat bilaterally; Visual fields grossly normal, pupils equal, round, reactive to light and accommodation III,IV, VI: ptosis not present, extra-ocular motions intact bilaterally V,VII: smile symmetric, facial light touch sensation normal bilaterally VIII: hearing normal bilaterally IX,X: uvula rises symmetrically XI: bilateral shoulder shrug XII: midline tongue extension without atrophy or fasciculations Motor: Mild right hemiparesis. Tone and bulk:normal tone throughout; no atrophy noted Sensory: Pinprick and light touch intact throughout, bilaterally Deep Tendon Reflexes:  Right: Upper Extremity  Left: Upper extremity   biceps (C-5 to C-6) 2/4   biceps (C-5 to C-6) 2/4 tricep (C7) 2/4    triceps (C7) 2/4 Brachioradialis (C6) 2/4  Brachioradialis (C6) 2/4  Lower Extremity Lower Extremity  quadriceps  (L-2 to L-4) 2/4   quadriceps (L-2 to L-4) 2/4 Achilles (S1) 2/4   Achilles (S1) 2/4  Plantars: Right: downgoing   Left: downgoing Cerebellar: normal finger-to-nose,  normal heel-to-shin test Gait:  No tested due to multiple leads    Results for orders placed or performed during the hospital encounter of 02/09/15 (from the past 48 hour(s))  Protime-INR     Status: None   Collection Time: 02/09/15  1:45 PM  Result Value Ref Range   Prothrombin Time 13.9 11.6 - 15.2 seconds   INR 1.05 0.00 - 1.49  APTT     Status: None   Collection Time: 02/09/15  1:45 PM  Result Value Ref Range   aPTT 31 24 - 37 seconds  CBC     Status: Abnormal   Collection Time: 02/09/15  1:45 PM  Result Value Ref Range   WBC 4.7 4.0 - 10.5 K/uL   RBC 4.33 4.22 - 5.81 MIL/uL   Hemoglobin 13.0 13.0 - 17.0 g/dL   HCT 37.0 (L) 39.0 - 52.0 %   MCV 85.5 78.0 - 100.0 fL   MCH 30.0 26.0 - 34.0 pg   MCHC 35.1 30.0 - 36.0 g/dL   RDW 12.2 11.5 - 15.5 %   Platelets 279 150 - 400 K/uL  Differential     Status: None   Collection Time: 02/09/15  1:45 PM  Result Value Ref Range   Neutrophils Relative % 66 43 - 77 %   Neutro Abs 3.1 1.7 - 7.7 K/uL   Lymphocytes Relative 25 12 - 46 %   Lymphs Abs 1.1 0.7 - 4.0 K/uL   Monocytes Relative 8 3 - 12 %   Monocytes Absolute 0.4 0.1 - 1.0 K/uL   Eosinophils Relative 1 0 - 5 %   Eosinophils Absolute 0.0 0.0 - 0.7 K/uL   Basophils Relative 0 0 - 1 %   Basophils Absolute 0.0 0.0 - 0.1 K/uL  Comprehensive metabolic panel     Status: Abnormal   Collection Time: 02/09/15  1:45 PM  Result Value Ref Range   Sodium 136 135 - 145 mmol/L   Potassium 4.1 3.5 - 5.1 mmol/L   Chloride 103 101 - 111 mmol/L   CO2 22 22 - 32 mmol/L   Glucose, Bld 262 (H) 65 - 99 mg/dL   BUN 20 6 - 20 mg/dL   Creatinine, Ser 1.42 (H) 0.61 - 1.24 mg/dL   Calcium 8.9 8.9 - 10.3 mg/dL   Total Protein 6.7 6.5 - 8.1 g/dL   Albumin 3.8 3.5 - 5.0 g/dL   AST 26 15 - 41 U/L   ALT 22 17 - 63 U/L   Alkaline  Phosphatase 81 38 - 126 U/L   Total Bilirubin 1.8 (H) 0.3 - 1.2 mg/dL   GFR calc non Af Amer 51 (L) >60 mL/min   GFR calc Af Amer 59 (L) >60 mL/min    Comment: (NOTE) The eGFR has been calculated using the CKD EPI equation. This calculation has not been validated in all clinical situations. eGFR's persistently <60 mL/min signify possible Chronic Kidney Disease.    Anion gap 11 5 - 15  I-stat troponin, ED (not at [facility])     Status: None   Collection  Time: 02/09/15  1:49 PM  Result Value Ref Range   Troponin i, poc 0.00 0.00 - 0.08 ng/mL   Comment 3            Comment: Due to the release kinetics of cTnI, a negative result within the first hours of the onset of symptoms does not rule out myocardial infarction with certainty. If myocardial infarction is still suspected, repeat the test at appropriate intervals.   I-Stat Chem 8, ED  (not at [facility])     Status: Abnormal   Collection Time: 02/09/15  1:51 PM  Result Value Ref Range   Sodium 138 135 - 145 mmol/L   Potassium 4.1 3.5 - 5.1 mmol/L   Chloride 103 101 - 111 mmol/L   BUN 23 (H) 6 - 20 mg/dL   Creatinine, Ser 1.30 (H) 0.61 - 1.24 mg/dL   Glucose, Bld 261 (H) 65 - 99 mg/dL   Calcium, Ion 1.15 1.13 - 1.30 mmol/L   TCO2 19 0 - 100 mmol/L   Hemoglobin 13.9 13.0 - 17.0 g/dL   HCT 41.0 39.0 - 52.0 %  CBG monitoring, ED     Status: Abnormal   Collection Time: 02/09/15  2:02 PM  Result Value Ref Range   Glucose-Capillary 246 (H) 65 - 99 mg/dL   Ct Head (brain) Wo Contrast  02/09/2015   CLINICAL DATA:  Right hand numbness which started this morning. History of blindness. Colonoscopy same day.  EXAM: CT HEAD WITHOUT CONTRAST  TECHNIQUE: Contiguous axial images were obtained from the base of the skull through the vertex without intravenous contrast.  COMPARISON:  None.  FINDINGS: No acute intracranial hemorrhage. No focal mass lesion. No CT evidence of acute infarction. No midline shift or mass effect. No hydrocephalus. Basilar  cisterns are patent.  There is mild cortical atrophy. There is mild periventricular white matter hypodensities.  Postsurgical change in the right globe. Paranasal sinuses and mastoid air cells are clear.  There is mucosal thickening in the maxillary sinuses, greater on the left. Frontal sinuses are clear.  IMPRESSION: 1. No acute intracranial findings. 2. Mild atrophy and white matter microvascular disease. 3. Maxillary sinus inflammation.   Electronically Signed   By: Suzy Bouchard M.D.   On: 02/09/2015 15:20   Ct Cervical Spine Wo Contrast  02/09/2015   CLINICAL DATA:  intermittent RIGHT hand numbness that began yesterday. Recent endoscopy.  EXAM: CT CERVICAL SPINE WITHOUT CONTRAST  TECHNIQUE: Multidetector CT imaging of the cervical spine was performed without intravenous contrast. Multiplanar CT image reconstructions were also generated.  COMPARISON:  CT head earlier today.  FINDINGS: There is no visible cervical spine fracture, traumatic subluxation, prevertebral soft tissue swelling, or intraspinal hematoma. Mild disc space narrowing at C5-6 and C6-7. Multilevel uncinate spurring most pronounced at C6-7, LEFT. No worrisome osseous lesions. Cervicothoracic junction unremarkable. Moderate anterior spurring. Odontoid is intact. No neck masses. Carotid atherosclerosis. Lung apices clear.  IMPRESSION: Cervical spondylosis as described; no acute features.   Electronically Signed   By: Rolla Flatten M.D.   On: 02/09/2015 17:44      Assessment: 64 y.o. male presents with new onset right sided paresthesias without face involvement and right leg weakness. Mild right hemiparesis on exam. 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 He is out of the window for thrombolysis. Admit to medicine. Ordered stroke work up. Aspirin pending results  stroke testing. Stroke team will follow up  tomorrow.  Stroke Risk Factors - HTN, DM   Plan: 1. HgbA1c, fasting lipid panel 2. MRI, MRA  of the brain without contrast 3. Echocardiogram 4. Carotid dopplers 5. Prophylactic therapy-aspirin 6. Risk factor modification 7. Telemetry monitoring 8. Frequent neuro checks 9. PT/OT SLP 10. NPO until swallowing evaluation by RN stroke.  Dorian Pod, MD  [facility] [phone]  02/09/2015, 8:14 PM

## 2015-02-09 NOTE — H&P (Signed)
[facility] History and Physical  Patient: Russell Austin  MRN: [id number]  DOB: 1951/05/02  DOS: the patient was seen and examined on 02/09/2015 PCP: TODD,JEFFREY ALLEN, MD  Chief Complaint: Right-sided weakness and numbness  HPI: Russell Austin is a 64 y.o. male with Past medical history of diabetes mellitus type 2, hypertension, chronic kidney disease, diabetic retinopathy, glaucoma with right eye visual loss peripheral neuropathy. The patient is presenting with complaints of right-sided numbness. He mentions that when he woke up from the sleep this morning he started having complains of numbness of the right side. He went for a colonoscopy today and after the procedure he started having increasing numbness of the right leg as well. He was having difficulty ambulating. He started having a sensation of choking as well. He has some difficulty as well. He denies any dizziness or lightheadedness or vertigo. Next and he mentions he has chronically difficulty measuring distance with his left eye secondary to right eye visual loss. He denies any chest pain fever or chills diarrhea nausea vomiting. Denies any recent change in his medications.  The patient is coming from home. And at his baseline independent for most of his ADL.  Review of Systems: as mentioned in the history of present illness.  A comprehensive review of the other systems is negative.  Past Medical History  Diagnosis Date  . Diabetes mellitus type II   . Hypertension   . ED (erectile dysfunction)   . Diabetic retinopathy FOLLOWED BY DR Zadie Rhine  . 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 Glaucoma of both eyes   . Left hydrocele    Past Surgical History  Procedure Laterality Date  . Undescended right testicle removed  1994  . Right eye vitrectomy/ insertion glaucoma seton/ laser repair  12-13-2008    RETINAL ARTERY OCCLUSION /NEOVASCULAR GLAUCOMA/ HEMORRHAGE  . Right eye vitretomy/  insertion glaucoma seton x2/ laser  03-24-2009    RECURRENT HEMORRHAGE/ OCCLUSION INTERNAL SETON  . Left eye laser retina repair  SEPT 2012  . Appendectomy  AGE EARLY 20'S  . Hydrocele excision  03/31/2012    Procedure: HYDROCELECTOMY ADULT;  Surgeon: Franchot Gallo, MD;  Location: [facility];  Service: Urology;  Laterality: Left;  25 MINS     Social History:  reports that he has never smoked. He has never used smokeless tobacco. He reports that he does not drink alcohol or use illicit drugs.  Allergies  Allergen Reactions  . Apple Pectin [Pectin] Itching    ITCHY THROAT  . Peach [Prunus Persica] Itching    ITCHY THROAT    Family History  Problem Relation Age of Onset  . Glaucoma Mother     Prior to Admission medications   Medication Sig Start Date End Date Taking? Authorizing Provider  glipiZIDE (GLUCOTROL XL) 10 MG 24 hr tablet Take 1 tablet (10 mg total) by mouth daily. 01/10/15  Yes Dorena Cookey, MD  hydrochlorothiazide (HYDRODIURIL) 25 MG tablet TAKE ONE TABLET BY MOUTH EVERY DAY 01/10/15  Yes Dorena Cookey, MD  lisinopril (PRINIVIL,ZESTRIL) 40 MG tablet Take 1 tablet (40 mg total) by mouth daily. 01/10/15  Yes Dorena Cookey, MD  metFORMIN (GLUCOPHAGE) 1000 MG tablet Take 500 mg by mouth 2 (two) times daily with a meal.   Yes Historical Provider, MD  metFORMIN (GLUCOPHAGE) 1000 MG tablet 1 by mouth twice a day Patient not taking: Reported on 02/09/2015 01/10/15   Dorena Cookey, MD  Physical Exam: Filed Vitals:   02/09/15 1830 02/09/15 1900 02/09/15 2215 02/09/15 2258  BP: 167/82 161/86 141/71 157/78  Pulse: 85 93 91 95  Temp:    98.1 F (36.7 C)  TempSrc:    Oral  Resp: 24 17 20 18   Height:      Weight:      SpO2: 100% 99% 100% 100%    General: Alert, Awake and Oriented to Time, Place and Person. Appear in mild distress Eyes: PRRL, right eye visual loss chronic ENT: Oral Mucosa clear moist. Neck: no JVD Cardiovascular: S1 and S2 Present, no  Murmur, Peripheral Pulses Present Respiratory: Bilateral Air entry equal and Decreased,  Clear to Auscultation, no Crackles, no wheezes Abdomen: Bowel Sound present, Soft and non tender Skin: no Rash Extremities: no Pedal edema, no calf tenderness Neurologic:  Bilateral past-pointing Right-sided weakness both upper and lower extremity, right-sided sensory loss both upper and lower extremity.  Labs on Admission:  CBC:  Recent Labs Lab 02/09/15 1345 02/09/15 1351  WBC 4.7  --   NEUTROABS 3.1  --   HGB 13.0 13.9  HCT 37.0* 41.0  MCV 85.5  --   PLT 279  --     CMP     Component Value Date/Time   NA 138 02/09/2015 1351   K 4.1 02/09/2015 1351   CL 103 02/09/2015 1351   CO2 22 02/09/2015 1345   GLUCOSE 261* 02/09/2015 1351   BUN 23* 02/09/2015 1351   CREATININE 1.30* 02/09/2015 1351   CALCIUM 8.9 02/09/2015 1345   PROT 6.7 02/09/2015 1345   ALBUMIN 3.8 02/09/2015 1345   AST 26 02/09/2015 1345   ALT 22 02/09/2015 1345   ALKPHOS 81 02/09/2015 1345   BILITOT 1.8* 02/09/2015 1345   GFRNONAA 51* 02/09/2015 1345   GFRAA 59* 02/09/2015 1345    No results for input(s): LIPASE, AMYLASE in the last 168 hours.  No results for input(s): CKTOTAL, CKMB, CKMBINDEX, TROPONINI in the last 168 hours. BNP (last 3 results) No results for input(s): BNP in the last 8760 hours.  ProBNP (last 3 results) No results for input(s): PROBNP in the last 8760 hours.   Radiological Exams on Admission: Ct Head (brain) Wo Contrast  02/09/2015   CLINICAL DATA:  Right hand numbness which started this morning. History of blindness. Colonoscopy same day.  EXAM: CT HEAD WITHOUT CONTRAST  TECHNIQUE: Contiguous axial images were obtained from the base of the skull through the vertex without intravenous contrast.  COMPARISON:  None.  FINDINGS: No acute intracranial hemorrhage. No focal mass lesion. No CT evidence of acute infarction. No midline shift or mass effect. No hydrocephalus. Basilar cisterns are  patent.  There is mild cortical atrophy. There is mild periventricular white matter hypodensities.  Postsurgical change in the right globe. Paranasal sinuses and mastoid air cells are clear.  There is mucosal thickening in the maxillary sinuses, greater on the left. Frontal sinuses are clear.  IMPRESSION: 1. No acute intracranial findings. 2. Mild atrophy and white matter microvascular disease. 3. Maxillary sinus inflammation.   Electronically Signed   By: Suzy Bouchard M.D.   On: 02/09/2015 15:20   Ct Cervical Spine Wo Contrast  02/09/2015   CLINICAL DATA:  intermittent RIGHT hand numbness that began yesterday. Recent endoscopy.  EXAM: CT CERVICAL SPINE WITHOUT CONTRAST  TECHNIQUE: Multidetector CT imaging of the cervical spine was performed without intravenous contrast. Multiplanar CT image reconstructions were also generated.  COMPARISON:  CT head earlier today.  FINDINGS: There  is no visible cervical spine fracture, traumatic subluxation, prevertebral soft tissue swelling, or intraspinal hematoma. Mild disc space narrowing at C5-6 and C6-7. Multilevel uncinate spurring most pronounced at C6-7, LEFT. No worrisome osseous lesions. Cervicothoracic junction unremarkable. Moderate anterior spurring. Odontoid is intact. No neck masses. Carotid atherosclerosis. Lung apices clear.  IMPRESSION: Cervical spondylosis as described; no acute features.   Electronically Signed   By: Rolla Flatten M.D.   On: 02/09/2015 17:44   Mr Jodene Nam Head Wo Contrast  02/09/2015   CLINICAL DATA:  Brainstem infarction. Difficulty swallowing. Right-sided numbness.  EXAM: MRA HEAD WITHOUT CONTRAST  TECHNIQUE: Angiographic images of the Circle of Willis were obtained using MRA technique without intravenous contrast.  COMPARISON:  MRI brain reported separately.  FINDINGS: The internal carotid arteries are widely patent. The basilar artery is slightly irregular but widely patent. LEFT vertebral is the dominant posterior circulation vessel, but  both vertebrals contribute to formation of the basilar.  There is no proximal stenosis of the anterior or middle cerebral arteries. Minor irregularity ambient P2 segment both posterior cerebral arteries up to 50%. No distal vertebral irregularity.  Both PICA vessels are patent. The LEFT AICA vessel is not visualized. The RIGHT AICA vessel is severely diseased proximally. Both superior cerebellar arteries are unremarkable.  IMPRESSION: Intracranial atherosclerotic change as described. No large vessel flow-limiting stenosis or occlusion.  The observed pattern of LEFT paramedian lower pontine and upper medullary infarction is most consistent with small vessel disease or brainstem penetrating perforators.   Electronically Signed   By: Rolla Flatten M.D.   On: 02/09/2015 21:50   Mr Brain Wo Contrast  02/09/2015   CLINICAL DATA:  Intermittent RIGHT hand numbness. This also involves the RIGHT shoulder and RIGHT leg. Difficulty swallowing.  EXAM: MRI HEAD WITHOUT CONTRAST  TECHNIQUE: Multiplanar, multiecho pulse sequences of the brain and surrounding structures were obtained without intravenous contrast.  COMPARISON:  CT head and cervical spine earlier today.  FINDINGS: There is an acute infarction involving the lower pons and upper medulla on the LEFT. This extends over an AP dimension of approximately 1 cm, rostral caudal extent of approximately 1 cm, and is approximately 5 mm thick.  The infarct is nonhemorrhagic. Early cytotoxic edema can be seen on T2 and FLAIR imaging. No other similar areas of acute infarction.  Generalized atrophy. Mild subcortical and periventricular T2 and FLAIR hyperintensities, likely chronic microvascular ischemic change. Flow voids are maintained throughout the carotid, basilar, and vertebral arteries. There are no areas of chronic hemorrhage. Pituitary, pineal, and cerebellar tonsils unremarkable. No upper cervical lesions. Visualized calvarium, skull base, and upper cervical osseous  structures unremarkable. Scalp and extracranial soft tissues, sinuses, and mastoids show no acute process. Chronic deformity of the RIGHT globe, small and shrunken with chronic vitreous hemorrhage and calcification.  IMPRESSION: Acute LEFT paramedian 5 x 10 x 10 mm. lower brainstem infarction. This is nonhemorrhagic.  Chronic changes as described.   Electronically Signed   By: Rolla Flatten M.D.   On: 02/09/2015 21:46   Mr Cervical Spine Wo Contrast  02/09/2015   CLINICAL DATA:  Intermittent RIGHT hand numbness beginning yesterday. Mild RIGHT leg numbness beginning today, difficulty swallowing. History of diabetes, hypertension.  EXAM: MRI CERVICAL SPINE WITHOUT CONTRAST  TECHNIQUE: Multiplanar, multisequence MR imaging of the cervical spine was performed. No intravenous contrast was administered.  COMPARISON:  CT of the cervical spine February 09, 2015 at 1740 hours.  FINDINGS: Cervical vertebral bodies and posterior elements are intact and aligned, straightened  cervical lordosis. Moderate C4-5 through C6-7 disc height loss, with decreased T2 signal within all cervical disc consistent with mild desiccation. Moderate to severe acute on chronic discogenic endplate changes at D34-534 and C5-6. Moderate to severe chronic discogenic endplate changes D34-534. No STIR signal abnormality to suggest acute osseous process.  Cervical spinal cord appears normal morphology and signal characteristics from the cervical medullary junction to level of T3-4, the most caudal well visualized level. Craniocervical junction is intact. Asymmetry of the scalene muscles, the middle and posterior slips on the RIGHT may be developmentally fused versus atrophic posterior slip.  Level by level evaluation:  C2-3: Uncovertebral hypertrophy in moderate facet arthropathy without canal stenosis or neural foraminal narrowing.  C3-4: Uncovertebral hypertrophy. Minimal facet arthropathy without canal stenosis. Mild RIGHT neural foraminal narrowing.  C4-5: 2 mm  broad-based disc bulge, uncovertebral hypertrophy and minimal facet arthropathy. Mild to moderate canal stenosis. Moderate RIGHT, moderate to severe LEFT neural foraminal narrowing.  C5-6: 2 mm broad-based disc bulge, uncovertebral hypertrophy and minimal facet arthropathy. Mild canal stenosis. Moderate RIGHT, moderate to severe LEFT neural foraminal narrowing.  C6-7: Small broad-based disc bulge with tiny broad-based LEFT central disc protrusion. Uncovertebral hypertrophy and minimal facet arthropathy. Mild canal stenosis. Mild RIGHT, moderate LEFT neural foraminal narrowing.  C7-T1: No significant disc bulge, canal stenosis or neural foraminal narrowing.  IMPRESSION: Straightened cervical lordosis without acute fracture nor malalignment.  Degenerative change of the cervical spine resulting in mild to moderate canal stenosis at C4-5, mild at C5-6 and C6-7.  Neural foraminal narrowing C3-4 thru C6-7: Moderate to severe on the LEFT at C4-5 and C6-7.  Asymmetric scalene muscles: Fused RIGHT middle and posterior slips versus atrophic posterior sludge.   Electronically Signed   By: Elon Alas M.D.   On: 02/09/2015 22:01   EKG: Independently reviewed. normal sinus rhythm, nonspecific ST and T waves changes.  Assessment/Plan Principal Problem:   CVA (cerebral infarction) Active Problems:   Peripheral neuropathy   Essential hypertension   CKD (chronic kidney disease)   Type 2 diabetes mellitus   1. CVA (cerebral infarction) The patient is presenting with complaints of right-sided weakness. MRI is positive for CVA of brainstem. Neurology has been consulted. Currently continuing aspirin. Nothing by mouth PT OT evaluation and speech evaluation. Further workup depending on neurology recommendation.  2. History of diabetes mellitus with peripheral neuropathy as well as chronic kidney disease. Continue closely monitoring. Currently holding on all the medications. Every 6 hours insulin sliding  scale.  3. Essential hypertension. Next and holding blood pressure medication at present. Monitor closely. Permissive hypertension at present.  Advance goals of care discussion: Full code   Consults: ED physician discussed with Dr. Aram Beecham from neurology.  DVT Prophylaxis: subcutaneous Heparin Nutrition: Nothing by mouth  Disposition: Admitted as inpatient,telemetry unit.  Author: Berle Mull, MD [facility] Pager: [phone] 02/09/2015  If 7PM-7AM, please contact night-coverage [email or web address] Password [email or web address]

## 2015-02-09 NOTE — ED Notes (Signed)
Pt reports intermittent right hand numbness that started yesterday. Today began also having mild numbness to right leg and right shoulder. Grips are equal, no facial droop, speech clear at triage.

## 2015-02-09 NOTE — ED Notes (Signed)
CBG 246 ?

## 2015-02-09 NOTE — Progress Notes (Signed)
Pt failed swallow screen, unable to give Aspirin

## 2015-02-09 NOTE — Progress Notes (Signed)
Report received from ED, RN

## 2015-02-09 NOTE — ED Provider Notes (Signed)
Medical screening examination/treatment/procedure(s) were conducted as a shared visit with non-physician practitioner(s) and myself.  I personally evaluated the patient during the encounter.  Pt presents with numbness and tingling and right leg and arm.  Initial CT negative.  Will admit for further TIA workup.  Dorie Rank, MD 02/09/15 2152

## 2015-02-09 NOTE — ED Notes (Signed)
Pt was able to ambulate in hallway, pt was having trouble with his Right leg. Pt stated his right leg felt very weak.

## 2015-02-10 ENCOUNTER — Inpatient Hospital Stay (HOSPITAL_COMMUNITY): Payer: BC Managed Care – PPO

## 2015-02-10 ENCOUNTER — Ambulatory Visit (HOSPITAL_COMMUNITY): Payer: BC Managed Care – PPO

## 2015-02-10 DIAGNOSIS — E1140 Type 2 diabetes mellitus with diabetic neuropathy, unspecified: Secondary | ICD-10-CM

## 2015-02-10 DIAGNOSIS — M542 Cervicalgia: Secondary | ICD-10-CM | POA: Insufficient documentation

## 2015-02-10 DIAGNOSIS — N183 Chronic kidney disease, stage 3 (moderate): Secondary | ICD-10-CM

## 2015-02-10 DIAGNOSIS — I639 Cerebral infarction, unspecified: Secondary | ICD-10-CM

## 2015-02-10 DIAGNOSIS — I10 Essential (primary) hypertension: Secondary | ICD-10-CM

## 2015-02-10 DIAGNOSIS — G629 Polyneuropathy, unspecified: Secondary | ICD-10-CM

## 2015-02-10 DIAGNOSIS — I6302 Cerebral infarction due to thrombosis of basilar artery: Principal | ICD-10-CM

## 2015-02-10 DIAGNOSIS — E118 Type 2 diabetes mellitus with unspecified complications: Secondary | ICD-10-CM

## 2015-02-10 DIAGNOSIS — N189 Chronic kidney disease, unspecified: Secondary | ICD-10-CM

## 2015-02-10 DIAGNOSIS — N185 Chronic kidney disease, stage 5: Secondary | ICD-10-CM

## 2015-02-10 LAB — LIPID PANEL
CHOL/HDL RATIO: 3.9 ratio
CHOLESTEROL: 128 mg/dL (ref 0–200)
Cholesterol: 130 mg/dL (ref 0–200)
HDL: 33 mg/dL — ABNORMAL LOW (ref >40)
HDL: 33 mg/dL — ABNORMAL LOW (ref >40)
LDL CALC: 79 mg/dL (ref 0–99)
LDL Cholesterol: 76 mg/dL (ref 0–99)
TRIGLYCERIDES: 93 mg/dL (ref <150)
Total CHOL/HDL Ratio: 3.9 RATIO
Triglycerides: 90 mg/dL (ref <150)
VLDL: 19 mg/dL (ref 0–40)
VLDL: 18 mg/dL (ref 0–40)

## 2015-02-10 LAB — COMPREHENSIVE METABOLIC PANEL
ALT: 17 U/L (ref 17–63)
ANION GAP: 8 (ref 5–15)
AST: 22 U/L (ref 15–41)
Albumin: 3.4 g/dL — ABNORMAL LOW (ref 3.5–5.0)
Alkaline Phosphatase: 72 U/L (ref 38–126)
BUN: 18 mg/dL (ref 6–20)
CO2: 24 mmol/L (ref 22–32)
Calcium: 8.6 mg/dL — ABNORMAL LOW (ref 8.9–10.3)
Chloride: 107 mmol/L (ref 101–111)
Creatinine, Ser: 1.28 mg/dL — ABNORMAL HIGH (ref 0.61–1.24)
GFR calc Af Amer: >60 mL/min (ref >60)
GFR, EST NON AFRICAN AMERICAN: 58 mL/min — AB (ref >60)
Glucose, Bld: 110 mg/dL — ABNORMAL HIGH (ref 65–99)
Potassium: 3.6 mmol/L (ref 3.5–5.1)
Sodium: 139 mmol/L (ref 135–145)
Total Bilirubin: 1.7 mg/dL — ABNORMAL HIGH (ref 0.3–1.2)
Total Protein: 6.3 g/dL — ABNORMAL LOW (ref 6.5–8.1)

## 2015-02-10 LAB — CBC WITH DIFFERENTIAL/PLATELET
BASOS PCT: 0 % (ref 0–1)
Basophils Absolute: 0 10*3/uL (ref 0.0–0.1)
EOS PCT: 1 % (ref 0–5)
Eosinophils Absolute: 0 10*3/uL (ref 0.0–0.7)
HEMATOCRIT: 35.4 % — AB (ref 39.0–52.0)
Hemoglobin: 12 g/dL — ABNORMAL LOW (ref 13.0–17.0)
LYMPHS ABS: 1.1 10*3/uL (ref 0.7–4.0)
LYMPHS PCT: 21 % (ref 12–46)
MCH: 29.2 pg (ref 26.0–34.0)
MCHC: 33.9 g/dL (ref 30.0–36.0)
MCV: 86.1 fL (ref 78.0–100.0)
MONO ABS: 0.5 10*3/uL (ref 0.1–1.0)
MONOS PCT: 10 % (ref 3–12)
NEUTROS ABS: 3.6 10*3/uL (ref 1.7–7.7)
NEUTROS PCT: 68 % (ref 43–77)
Platelets: 281 10*3/uL (ref 150–400)
RBC: 4.11 MIL/uL — ABNORMAL LOW (ref 4.22–5.81)
RDW: 12.4 % (ref 11.5–15.5)
WBC: 5.3 10*3/uL (ref 4.0–10.5)

## 2015-02-10 LAB — GLUCOSE, CAPILLARY
GLUCOSE-CAPILLARY: 243 mg/dL — AB (ref 65–99)
GLUCOSE-CAPILLARY: 121 mg/dL — AB (ref 65–99)
Glucose-Capillary: 126 mg/dL — ABNORMAL HIGH (ref 65–99)
Glucose-Capillary: 118 mg/dL — ABNORMAL HIGH (ref 65–99)
Glucose-Capillary: 172 mg/dL — ABNORMAL HIGH (ref 65–99)

## 2015-02-10 LAB — PROTIME-INR
INR: 1.06 (ref 0.00–1.49)
Prothrombin Time: 14 seconds (ref 11.6–15.2)

## 2015-02-10 MED ORDER — ASPIRIN 325 MG PO TABS: 325.0000 mg | ORAL_TABLET | Freq: Every day | ORAL | Status: DC

## 2015-02-10 MED ORDER — HEPARIN SODIUM (PORCINE) 5000 UNIT/ML IJ SOLN
5000.0000 [IU] | Freq: Three times a day (TID) | INTRAMUSCULAR | Status: DC
  Administered 2015-02-10 – 2015-02-14 (×14): 5000 [IU] via SUBCUTANEOUS
  Filled 2015-02-10 (×15): qty 1

## 2015-02-10 MED ORDER — JEVITY 1.2 CAL PO LIQD
1000.0000 mL | ORAL | Status: DC
  Administered 2015-02-10 – 2015-02-12 (×3): 1000 mL
  Filled 2015-02-10 (×5): qty 1000

## 2015-02-10 MED ORDER — ASPIRIN 325 MG PO TABS
325.0000 mg | ORAL_TABLET | Freq: Every day | ORAL | Status: DC
  Administered 2015-02-11 – 2015-02-14 (×4): 325 mg via ORAL
  Filled 2015-02-10 (×6): qty 1

## 2015-02-10 MED ORDER — STROKE: EARLY STAGES OF RECOVERY BOOK
Freq: Once | Status: AC
Start: 2015-02-10 — End: 2015-02-10
  Administered 2015-02-10: 08:00:00
  Filled 2015-02-10: qty 1

## 2015-02-10 MED ORDER — INSULIN ASPART 100 UNIT/ML ~~LOC~~ SOLN
0.0000 [IU] | Freq: Four times a day (QID) | SUBCUTANEOUS | Status: DC
Start: 2015-02-10 — End: 2015-02-12
  Administered 2015-02-10: 5 [IU] via SUBCUTANEOUS
  Administered 2015-02-10: 2 [IU] via SUBCUTANEOUS
  Administered 2015-02-11: 5 [IU] via SUBCUTANEOUS
  Administered 2015-02-11 (×3): 3 [IU] via SUBCUTANEOUS
  Administered 2015-02-12: 11 [IU] via SUBCUTANEOUS

## 2015-02-10 MED ORDER — ATORVASTATIN CALCIUM 10 MG PO TABS
10.0000 mg | ORAL_TABLET | Freq: Every day | ORAL | Status: DC
  Administered 2015-02-11 – 2015-02-14 (×4): 10 mg via ORAL
  Filled 2015-02-10 (×5): qty 1

## 2015-02-10 MED ORDER — INSULIN GLARGINE 100 UNIT/ML ~~LOC~~ SOLN
12.0000 [IU] | Freq: Every day | SUBCUTANEOUS | Status: DC
  Administered 2015-02-10 – 2015-02-11 (×2): 12 [IU] via SUBCUTANEOUS
  Filled 2015-02-10 (×3): qty 0.12

## 2015-02-10 MED ORDER — ASPIRIN 300 MG RE SUPP
300.0000 mg | Freq: Every day | RECTAL | Status: DC
  Administered 2015-02-10: 300 mg via RECTAL
  Filled 2015-02-10 (×5): qty 1

## 2015-02-10 MED ORDER — ASPIRIN 300 MG RE SUPP: 300.0000 mg | Freq: Every day | RECTAL | Status: DC

## 2015-02-10 NOTE — Progress Notes (Signed)
Order for swallow eval received, given pt with medullary/pons cva and symptomatic, will proceed with MBS this am at 0830.  Pt, wife, RN informed and agreeable to plan.  Luanna Salk, [place] [facility] SLP [phone]

## 2015-02-10 NOTE — Progress Notes (Signed)
Severe pharyngeal phase dysphagia;Severe cervical esophageal phase dysphagia   Clinical Impression Pt presents with gross sensorimotor pharyngo=cervical esophageal dysphagia due to medullary/pons cva. Nearly absent pharyngel contraction, laryngeal elevation noted resulting in only 1/8 of tsp thin transiting into esophagus. Remainder was retained in pharynx/presumed to be aspirated- pt moved out of flouro with overt coughing. Delayed swallow response to pyriform sinus noted. At one point, pt stated "I swallowed" when muscular contraction was not observed. Only 2 tsps of liquid (nectar/thin) provided and testing ceased due to level of dysphagia.   Using live video, educated pt/spouse to findings/recommendations. As pt demonstrates adequate oral skills, recommend he swish and expectorate with water prn for comfort/oral hygeine. Reviewed importance of oral hygiene with pt and spouse.   Pt will benefit from consideration for alternative means of nutrition - ? longer term. Wife and pt educated using teach back for reinforcement.       No flowsheet data found.   CHL IP DIET RECOMMENDATION 02/10/2015  SLP Diet Recommendations NPO=-swish and expectorate with water prn  Liquid Administration via (None)  Medication Administration Via alternative means  Compensations (None)  Postural Changes and/or Swallow Maneuvers (None)        RN and MD phoned with results.   Luanna Salk, [place] [facility] SLP [phone]

## 2015-02-10 NOTE — Progress Notes (Signed)
Initial Nutrition Assessment  DOCUMENTATION CODES:  Not applicable  INTERVENTION:  Tube feeding   Initiate Jevity 1.2 @ 20 ml/hr via NGT and increase by 10 ml every 4 hours to goal rate of 65 ml/hr.   Tube feeding regimen provides 1872 kcal (100% of needs), 87 grams of protein, and 1259 ml of H2O.   NUTRITION DIAGNOSIS:  Inadequate oral intake related to inability to eat as evidenced by NPO status.   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 male with Past medical history of diabetes mellitus type 2, hypertension, chronic kidney disease, diabetic retinopathy, glaucoma with right eye visual loss peripheral neuropathy. The patient is presenting with complaints of right-sided numbness.  Hx obtained by pt and family at bedside. They estimate that pt has lost approximately 19# gradually over the past year. UBW is 185#. Per pt and family, wt loss was not abrupt and he was eating well until last week.  Pt expresses desire to eat, complaining of hunger. Per pt, he had a colonoscopy earlier this week and consumed two boiled eggs before the procedure ("and I haven't had anything since"). Per pt wife, pt suffered a stroke and knows he is unable to take anything PO due to loss of swallow function. They are strongly considering placement of a feeding tube. SLP evaluated earlier today and is recommended nutrition via alternative means based on MBSS results.   Pt family had multiple questions about tube feeding, including NG tube vs PEG tube, and other alternatives (including parenteral nutrition). Discussed how long term plan of care will be determine by pt's ability to regain swallow function. Also shared with pt ASPEN guidelines for nutrition  support, which recommends enteral nutrition over parenteral nutrition, due to pt having a functioning GI tract. Pt family expressed appreciation for RD visit and information given.   Nutrition-Focused physical exam completed. Findings are no fat depletion, no muscle depletion, and no edema.   Addendum: Received call from RN requesting TF orders. NGT has been placed and location has been confirmed by xray.   Height:  Ht Readings from Last 1 Encounters:  02/09/15 5\' 9"  (1.753 m)    Weight:  Wt Readings from Last 1 Encounters:  02/09/15 156 lb 9.6 oz (71.033 kg)    Ideal Body Weight:  72.7 kg  Wt Readings from Last 10 Encounters:  02/09/15 156 lb 9.6 oz (71.033 kg)  12/20/14 182 lb (82.555 kg)  03/18/14 184 lb (83.462 kg)  12/24/13 184 lb (83.462 kg)  05/13/13 183 lb (83.008 kg)  10/30/12 182 lb (82.555 kg)  07/17/12 185 lb (83.915 kg)  03/26/12 185 lb (83.915 kg)  07/02/11 184 lb (83.462 kg)  06/01/10 188 lb (85.276 kg)    BMI:  Body mass index is 23.12 kg/(m^2).  Estimated Nutritional Needs:  Kcal:  [id number]  Protein:  80-90 grams  Fluid:  1.8-2.0 L  Skin:  Reviewed, no issues  Diet Order:  Diet NPO time specified  EDUCATION NEEDS:  Education needs addressed   Intake/Output Summary (Last 24 hours) at 02/10/15 1701 Last data filed at 02/10/15 1300  Gross per 24 hour  Intake      0 ml  Output      0 ml  Net  0 ml    Last BM:  02/09/15  Prudie Guthridge A. Jimmye Norman, RD, LDN, CDE Pager: [phone] After hours Pager: [phone]

## 2015-02-10 NOTE — Progress Notes (Signed)
Utilization review completed.  

## 2015-02-10 NOTE — Progress Notes (Signed)
STROKE TEAM PROGRESS NOTE   SUBJECTIVE (INTERVAL HISTORY) His wife and friend are at the bedside.  Overall he feels his condition is stable. Still has right sided weakness, but mild. Not able to swallow and needs tube feeding.  OBJECTIVE Temp:  [98.1 F (36.7 C)-98.5 F (36.9 C)] 98.5 F (36.9 C) (06/09 2125) Pulse Rate:  [89-95] 91 (06/09 2125) Cardiac Rhythm:  [-] Normal sinus rhythm (06/08 2247) Resp:  [18-20] 18 (06/09 2125) BP: (146-166)/(67-78) 162/77 mmHg (06/09 2125) SpO2:  [97 %-100 %] 98 % (06/09 2125)   Recent Labs Lab 02/09/15 1402 02/10/15 0144 02/10/15 0543 02/10/15 1157 02/10/15 1649  GLUCAP 246* 243* 121* 126* 118*    Recent Labs Lab 02/09/15 1345 02/09/15 1351 02/10/15 0734  NA 136 138 139  K 4.1 4.1 3.6  CL 103 103 107  CO2 22  --  24  GLUCOSE 262* 261* 110*  BUN 20 23* 18  CREATININE 1.42* 1.30* 1.28*  CALCIUM 8.9  --  8.6*    Recent Labs Lab 02/09/15 1345 02/10/15 0734  AST 26 22  ALT 22 17  ALKPHOS 81 72  BILITOT 1.8* 1.7*  PROT 6.7 6.3*  ALBUMIN 3.8 3.4*    Recent Labs Lab 02/09/15 1345 02/09/15 1351 02/10/15 0734  WBC 4.7  --  5.3  NEUTROABS 3.1  --  3.6  HGB 13.0 13.9 12.0*  HCT 37.0* 41.0 35.4*  MCV 85.5  --  86.1  PLT 279  --  281   No results for input(s): CKTOTAL, CKMB, CKMBINDEX, TROPONINI in the last 168 hours.  Recent Labs  02/09/15 1345 02/10/15 0734  LABPROT 13.9 14.0  INR 1.05 1.06   No results for input(s): COLORURINE, LABSPEC, PHURINE, GLUCOSEU, HGBUR, BILIRUBINUR, KETONESUR, PROTEINUR, UROBILINOGEN, NITRITE, LEUKOCYTESUR in the last 72 hours.  Invalid input(s): APPERANCEUR     Component Value Date/Time   CHOL 128 02/10/2015 0547   TRIG 93 02/10/2015 0547   HDL 33* 02/10/2015 0547   CHOLHDL 3.9 02/10/2015 0547   VLDL 19 02/10/2015 0547   LDLCALC 76 02/10/2015 0547   Lab Results  Component Value Date   HGBA1C 7.6* 12/13/2014   No results found for: LABOPIA, COCAINSCRNUR, LABBENZ, AMPHETMU,  THCU, LABBARB  No results for input(s): ETH in the last 168 hours.  I have personally reviewed the radiological images below and agree with the radiology interpretations.  Dg Abd 1 View  02/10/2015   IMPRESSION: Feeding tube tip in the mid stomach.     Ct Head (brain) Wo Contrast  02/09/2015   IMPRESSION: 1. No acute intracranial findings. 2. Mild atrophy and white matter microvascular disease. 3. Maxillary sinus inflammation.    [name] Head Wo Contrast  02/09/2015   IMPRESSION: Intracranial atherosclerotic change as described. No large vessel flow-limiting stenosis or occlusion.  The observed pattern of LEFT paramedian lower pontine and upper medullary infarction is most consistent with small vessel disease or brainstem penetrating perforators.    Mr Brain Wo Contrast  02/09/2015    IMPRESSION: Acute LEFT paramedian 5 x 10 x 10 mm. lower brainstem infarction. This is nonhemorrhagic.  Chronic changes as described.     Mr Cervical Spine Wo Contrast  02/09/2015  IMPRESSION: Straightened cervical lordosis without acute fracture nor malalignment.  Degenerative change of the cervical spine resulting in mild to moderate canal stenosis at C4-5, mild at C5-6 and C6-7.  Neural foraminal narrowing C3-4 thru C6-7: Moderate to severe on the LEFT at C4-5 and C6-7.  Asymmetric scalene  muscles: Fused RIGHT middle and posterior slips versus atrophic posterior sludge.     Carotid Doppler  Pending   2D Echocardiogram  pending  EKG  normal sinus rhythm, unchanged from previous tracings. For complete results please see formal report.  PHYSICAL EXAM  Temp:  [98.1 F (36.7 C)-98.5 F (36.9 C)] 98.5 F (36.9 C) (06/09 2125) Pulse Rate:  [89-95] 91 (06/09 2125) Resp:  [18-20] 18 (06/09 2125) BP: (146-166)/(67-78) 162/77 mmHg (06/09 2125) SpO2:  [97 %-100 %] 98 % (06/09 2125)  General - Well nourished, well developed, in no apparent distress.  Ophthalmologic - Sharp disc margins OU.  Cardiovascular -  Regular rate and rhythm with no murmur.  Mental Status -  Level of arousal and orientation to time, place, and person were intact. Language including expression, naming, repetition, comprehension was assessed and found intact. Attention span and concentration were normal. Recent and remote memory were intact. Fund of Knowledge was assessed and was intact.  Cranial Nerves II - XII - II - Visual field intact OU. But right eye blindness chronic.  III, IV, VI - Extraocular movements intact. Right eye corneal clouding. V - Facial sensation intact bilaterally. VII - mild right nasolabial fold flattening. VIII - Hearing & vestibular intact bilaterally. X - Palate elevates symmetrically. XI - Chin turning & shoulder shrug intact bilaterally. XII - Tongue protrusion intact.  Motor Strength - The patient's strength was 5-/5 RUE with dexterity difficulty. and 5-/5 RLE and pronator drift was present on the right.  Bulk was normal and fasciculations were absent.   Motor Tone - Muscle tone was assessed at the neck and appendages and was normal.  Reflexes - The patient's reflexes were symmetrical in all extremities and he had no pathological reflexes.  Sensory - Light touch, temperature/pinprick were assessed and were symmetrical.    Coordination - The patient had normal movements in the hands and feet with no ataxia or dysmetria.  Tremor was absent.  Gait and Station - not tested due to weakness.   ASSESSMENT/PLAN Russell Austin is a 64 y.o. male with history of HTN, DM, right eye blind admitted for right sided weakness x 3 days. Symptoms improving.    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 Currently on none  Stable  Hyperlipidemia  Home meds:  none   Currently none meds  LDL 76, goal < 70  Continue statin at discharge  Other Stroke Risk Factor    Other Active Problems  - mild anemia sometimes.  Other Pertinent History    Hospital day # 1   Rosalin Hawking, MD PhD Stroke Neurology 02/10/2015 10:45 PM    To contact Stroke Continuity provider, please refer to [email or web address] After hours, contact General Neurology

## 2015-02-10 NOTE — Progress Notes (Signed)
[facility] PROGRESS NOTE  SNYDER BOSSIER [id number] DOB: Jul 30, 1951 DOA: 02/09/2015 PCP: Joycelyn Man, MD  Assessment/Plan: 1. Lower brainstem infarction -Patient having multiple cardiac vascular risk factors including type 2 diabetes mellitus, hypertension, chronic kidney disease, presented with complaints of right-sided hemiplegia. Imaging revealed lower brainstem infarct severity compromising swallow function. -NG tube was placed as nutrition was consulted for the initiation of tube feeds. -Case discussed with neurology. Will continue aspirin at 325 mg by mouth daily along with atorvastatin 10 mg by mouth daily -Await transthoracic echocardiogram and carotid Dopplers  2.  Type 2 diabetes mellitus. -Patient had been on oral hypoglycemic therapy with metformin and glipizide.  -Initiating tube feeds, will transition to Lantus starting at 12 units subcutaneous daily at bedtime. -Continue monitoring blood sugars.  3.  Hypertension. -Patient presenting with acute CVA, allowing for permissive hypertension.. Systolic blood pressures in the 140s to 160s, will continue monitoring.  4.  Dyslipidemia. -Fasting lipid panel showing LDL of 79, not quite at goal given history of CVA. Will start Lipitor 10 mg by mouth daily  Code Status: Full code Family Communication: Spoke with family members present at bedside Disposition Plan:    Consultants:  Neurology    HPI/Subjective: Patient is a pleasant 64 year old gentleman with a past medical history of type 2 diabetes mellitus, hypertension, chronic disease, admitted to the medicine service on 02/09/2015 when he presented with complaints of right-sided hemiparesis. Unfortunately he was out of therapeutic window for thrombolysis. An MRI of brain revealed a lower brainstem infarct. Neurology was consulted. Speech pathology finding gross sensorimotor esophageal dysphagia with nearly absent pharyngeal contraction. An NG tube was  placed as nutrition was consulted for initiating tube feeds.  Objective: Filed Vitals:   02/10/15 1326  BP: 166/76  Pulse: 89  Temp: 98.4 F (36.9 C)  Resp: 20    Intake/Output Summary (Last 24 hours) at 02/10/15 1700 Last data filed at 02/10/15 1300  Gross per 24 hour  Intake      0 ml  Output      0 ml  Net      0 ml   Filed Weights   02/09/15 1323  Weight: 71.033 kg (156 lb 9.6 oz)    Exam:   General:  Patient is in no acute distress, awake, alert, states feeling hungry  Cardiovascular: Regular rate and rhythm normal S1-S2 no murmurs rubs or gallops  Respiratory: Normal respiratory effort, lungs are clear to auscultation bilaterally  Abdomen: Soft nontender nondistended positive bowel sounds  Musculoskeletal: No edema  Neurological: Patient having 3-5 muscle strength to his right upper and right lower extremity, associated with decreased sensation  Data Reviewed: Basic Metabolic Panel:  Recent Labs Lab 02/09/15 1345 02/09/15 1351 02/10/15 0734  NA 136 138 139  K 4.1 4.1 3.6  CL 103 103 107  CO2 22  --  24  GLUCOSE 262* 261* 110*  BUN 20 23* 18  CREATININE 1.42* 1.30* 1.28*  CALCIUM 8.9  --  8.6*   Liver Function Tests:  Recent Labs Lab 02/09/15 1345 02/10/15 0734  AST 26 22  ALT 22 17  ALKPHOS 81 72  BILITOT 1.8* 1.7*  PROT 6.7 6.3*  ALBUMIN 3.8 3.4*   No results for input(s): LIPASE, AMYLASE in the last 168 hours. No results for input(s): AMMONIA in the last 168 hours. CBC:  Recent Labs Lab 02/09/15 1345 02/09/15 1351 02/10/15 0734  WBC 4.7  --  5.3  NEUTROABS 3.1  --  3.6  HGB  13.0 13.9 12.0*  HCT 37.0* 41.0 35.4*  MCV 85.5  --  86.1  PLT 279  --  281   Cardiac Enzymes: No results for input(s): CKTOTAL, CKMB, CKMBINDEX, TROPONINI in the last 168 hours. BNP (last 3 results) No results for input(s): BNP in the last 8760 hours.  ProBNP (last 3 results) No results for input(s): PROBNP in the last 8760 hours.  CBG:  Recent  Labs Lab 02/09/15 1402 02/10/15 0144 02/10/15 0543 02/10/15 1157  GLUCAP 246* 243* 121* 126*    No results found for this or any previous visit (from the past 240 hour(s)).   Studies: Dg Abd 1 View  02/10/2015   CLINICAL DATA:  Feeding tube placement.  Initial encounter.  EXAM: ABDOMEN - 1 VIEW  COMPARISON:  None.  FINDINGS: 1632 hours. Feeding tube tip projects over the left upper quadrant of the abdomen consistent with position in the mid stomach. The visualized bowel gas pattern is normal. There is some contrast material within the right colon. No evidence of free air.  IMPRESSION: Feeding tube tip in the mid stomach.   Electronically Signed   By: Richardean Sale M.D.   On: 02/10/2015 16:42   Ct Head (brain) Wo Contrast  02/09/2015   CLINICAL DATA:  Right hand numbness which started this morning. History of blindness. Colonoscopy same day.  EXAM: CT HEAD WITHOUT CONTRAST  TECHNIQUE: Contiguous axial images were obtained from the base of the skull through the vertex without intravenous contrast.  COMPARISON:  None.  FINDINGS: No acute intracranial hemorrhage. No focal mass lesion. No CT evidence of acute infarction. No midline shift or mass effect. No hydrocephalus. Basilar cisterns are patent.  There is mild cortical atrophy. There is mild periventricular white matter hypodensities.  Postsurgical change in the right globe. Paranasal sinuses and mastoid air cells are clear.  There is mucosal thickening in the maxillary sinuses, greater on the left. Frontal sinuses are clear.  IMPRESSION: 1. No acute intracranial findings. 2. Mild atrophy and white matter microvascular disease. 3. Maxillary sinus inflammation.   Electronically Signed   By: Suzy Bouchard M.D.   On: 02/09/2015 15:20   Ct Cervical Spine Wo Contrast  02/09/2015   CLINICAL DATA:  intermittent RIGHT hand numbness that began yesterday. Recent endoscopy.  EXAM: CT CERVICAL SPINE WITHOUT CONTRAST  TECHNIQUE: Multidetector CT imaging of  the cervical spine was performed without intravenous contrast. Multiplanar CT image reconstructions were also generated.  COMPARISON:  CT head earlier today.  FINDINGS: There is no visible cervical spine fracture, traumatic subluxation, prevertebral soft tissue swelling, or intraspinal hematoma. Mild disc space narrowing at C5-6 and C6-7. Multilevel uncinate spurring most pronounced at C6-7, LEFT. No worrisome osseous lesions. Cervicothoracic junction unremarkable. Moderate anterior spurring. Odontoid is intact. No neck masses. Carotid atherosclerosis. Lung apices clear.  IMPRESSION: Cervical spondylosis as described; no acute features.   Electronically Signed   By: Rolla Flatten M.D.   On: 02/09/2015 17:44   Mr Jodene Nam Head Wo Contrast  02/09/2015   CLINICAL DATA:  Brainstem infarction. Difficulty swallowing. Right-sided numbness.  EXAM: MRA HEAD WITHOUT CONTRAST  TECHNIQUE: Angiographic images of the Circle of Willis were obtained using MRA technique without intravenous contrast.  COMPARISON:  MRI brain reported separately.  FINDINGS: The internal carotid arteries are widely patent. The basilar artery is slightly irregular but widely patent. LEFT vertebral is the dominant posterior circulation vessel, but both vertebrals contribute to formation of the basilar.  There is no proximal stenosis of  the anterior or middle cerebral arteries. Minor irregularity ambient P2 segment both posterior cerebral arteries up to 50%. No distal vertebral irregularity.  Both PICA vessels are patent. The LEFT AICA vessel is not visualized. The RIGHT AICA vessel is severely diseased proximally. Both superior cerebellar arteries are unremarkable.  IMPRESSION: Intracranial atherosclerotic change as described. No large vessel flow-limiting stenosis or occlusion.  The observed pattern of LEFT paramedian lower pontine and upper medullary infarction is most consistent with small vessel disease or brainstem penetrating perforators.    Electronically Signed   By: Rolla Flatten M.D.   On: 02/09/2015 21:50   Mr Brain Wo Contrast  02/09/2015   CLINICAL DATA:  Intermittent RIGHT hand numbness. This also involves the RIGHT shoulder and RIGHT leg. Difficulty swallowing.  EXAM: MRI HEAD WITHOUT CONTRAST  TECHNIQUE: Multiplanar, multiecho pulse sequences of the brain and surrounding structures were obtained without intravenous contrast.  COMPARISON:  CT head and cervical spine earlier today.  FINDINGS: There is an acute infarction involving the lower pons and upper medulla on the LEFT. This extends over an AP dimension of approximately 1 cm, rostral caudal extent of approximately 1 cm, and is approximately 5 mm thick.  The infarct is nonhemorrhagic. Early cytotoxic edema can be seen on T2 and FLAIR imaging. No other similar areas of acute infarction.  Generalized atrophy. Mild subcortical and periventricular T2 and FLAIR hyperintensities, likely chronic microvascular ischemic change. Flow voids are maintained throughout the carotid, basilar, and vertebral arteries. There are no areas of chronic hemorrhage. Pituitary, pineal, and cerebellar tonsils unremarkable. No upper cervical lesions. Visualized calvarium, skull base, and upper cervical osseous structures unremarkable. Scalp and extracranial soft tissues, sinuses, and mastoids show no acute process. Chronic deformity of the RIGHT globe, small and shrunken with chronic vitreous hemorrhage and calcification.  IMPRESSION: Acute LEFT paramedian 5 x 10 x 10 mm. lower brainstem infarction. This is nonhemorrhagic.  Chronic changes as described.   Electronically Signed   By: Rolla Flatten M.D.   On: 02/09/2015 21:46   Mr Cervical Spine Wo Contrast  02/09/2015   CLINICAL DATA:  Intermittent RIGHT hand numbness beginning yesterday. Mild RIGHT leg numbness beginning today, difficulty swallowing. History of diabetes, hypertension.  EXAM: MRI CERVICAL SPINE WITHOUT CONTRAST  TECHNIQUE: Multiplanar, multisequence  MR imaging of the cervical spine was performed. No intravenous contrast was administered.  COMPARISON:  CT of the cervical spine February 09, 2015 at 1740 hours.  FINDINGS: Cervical vertebral bodies and posterior elements are intact and aligned, straightened cervical lordosis. Moderate C4-5 through C6-7 disc height loss, with decreased T2 signal within all cervical disc consistent with mild desiccation. Moderate to severe acute on chronic discogenic endplate changes at D34-534 and C5-6. Moderate to severe chronic discogenic endplate changes D34-534. No STIR signal abnormality to suggest acute osseous process.  Cervical spinal cord appears normal morphology and signal characteristics from the cervical medullary junction to level of T3-4, the most caudal well visualized level. Craniocervical junction is intact. Asymmetry of the scalene muscles, the middle and posterior slips on the RIGHT may be developmentally fused versus atrophic posterior slip.  Level by level evaluation:  C2-3: Uncovertebral hypertrophy in moderate facet arthropathy without canal stenosis or neural foraminal narrowing.  C3-4: Uncovertebral hypertrophy. Minimal facet arthropathy without canal stenosis. Mild RIGHT neural foraminal narrowing.  C4-5: 2 mm broad-based disc bulge, uncovertebral hypertrophy and minimal facet arthropathy. Mild to moderate canal stenosis. Moderate RIGHT, moderate to severe LEFT neural foraminal narrowing.  C5-6: 2 mm broad-based disc  bulge, uncovertebral hypertrophy and minimal facet arthropathy. Mild canal stenosis. Moderate RIGHT, moderate to severe LEFT neural foraminal narrowing.  C6-7: Small broad-based disc bulge with tiny broad-based LEFT central disc protrusion. Uncovertebral hypertrophy and minimal facet arthropathy. Mild canal stenosis. Mild RIGHT, moderate LEFT neural foraminal narrowing.  C7-T1: No significant disc bulge, canal stenosis or neural foraminal narrowing.  IMPRESSION: Straightened cervical lordosis without  acute fracture nor malalignment.  Degenerative change of the cervical spine resulting in mild to moderate canal stenosis at C4-5, mild at C5-6 and C6-7.  Neural foraminal narrowing C3-4 thru C6-7: Moderate to severe on the LEFT at C4-5 and C6-7.  Asymmetric scalene muscles: Fused RIGHT middle and posterior slips versus atrophic posterior sludge.   Electronically Signed   By: Elon Alas M.D.   On: 02/09/2015 22:01   Dg Swallowing Func-speech Pathology  02/10/2015    Objective Swallowing Evaluation:    Patient Details  Name: Russell Austin MRN: [id number] Date of Birth: [birth year]  Today's Date: 02/10/2015 Time: SLP Start Time (ACUTE ONLY): 0825-SLP Stop Time (ACUTE ONLY): 0850 SLP Time Calculation (min) (ACUTE ONLY): 25 min  Past Medical History:  Past Medical History  Diagnosis Date  . Diabetes mellitus type II   . Hypertension   . ED (erectile dysfunction)   . Diabetic retinopathy FOLLOWED BY DR Zadie Rhine  . 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 Glaucoma of both eyes   . Left hydrocele    Past Surgical History:  Past Surgical History  Procedure Laterality Date  . Undescended right testicle removed  1994  . Right eye vitrectomy/ insertion glaucoma seton/ laser repair  12-13-2008     RETINAL ARTERY OCCLUSION /NEOVASCULAR GLAUCOMA/ HEMORRHAGE  . Right eye vitretomy/ insertion glaucoma seton x2/ laser  03-24-2009    RECURRENT HEMORRHAGE/ OCCLUSION INTERNAL SETON  . Left eye laser retina repair  SEPT 2012  . Appendectomy  AGE EARLY 20'S  . Hydrocele excision  03/31/2012    Procedure: HYDROCELECTOMY ADULT;  Surgeon: Franchot Gallo, MD;   Location: [facility];  Service: Urology;  Laterality:  Left;  49 MINS     HPI:  Other Pertinent Information: 64 yo male with h/o HTN, DM2, neuropathy,  CKD, blind- admitted after colonscopy with dysphagia and right hand/arm  numbness.  Pt failed an RNSSS.  Per MRI pt with left brainstem  nonhemorrhagic cva impacting medulla/pons.  Clinical  decision made to  forgo BSE and proceed with MBS due to brainstem cva.    No Data Recorded  Assessment / Plan / Recommendation CHL IP CLINICAL IMPRESSIONS 02/10/2015  Therapy Diagnosis Severe pharyngeal phase dysphagia;Severe cervical  esophageal phase dysphagia  Clinical Impression Pt presents with gross sensorimotor pharyngo=cervical  esophageal dysphagia due to medullary/pons cva.  Nearly absent pharyngel  contraction, laryngeal elevation noted resulting in only 1/8 of tsp thin  transiting into esophagus.  Remainder was retained in pharynx/presumed to  be aspirated- pt moved out of flouro with overt coughing.  Delayed swallow  response to pyriform sinus noted.  At one point, pt stated "I swallowed"  when muscular contraction was not observed.  Only 2 tsps of liquid  (nectar/thin) provided and testing ceased due to level of dysphagia.    Using live video, educated pt/spouse to findings/recommendations.  As pt  demonstrates adequate oral skills, recommend he swish and expectorate with  water prn for comfort/oral hygeine.  Reviewed importance of oral hygiene  with pt and spouse.   Pt will benefit from  consideration for alternative means of nutrition - ?  longer term.  Wife and pt educated using teach back for reinforcement.         No flowsheet data found.   CHL IP DIET RECOMMENDATION 02/10/2015  SLP Diet Recommendations NPO=-swish and expectorate with water prn  Liquid Administration via (None)  Medication Administration Via alternative means  Compensations (None)  Postural Changes and/or Swallow Maneuvers (None)     CHL IP OTHER RECOMMENDATIONS 02/10/2015  Recommended Consults (None)  Oral Care Recommendations Oral care QID  Other Recommendations Have oral suction available     No flowsheet data found.   CHL IP FREQUENCY AND DURATION 02/10/2015  Speech Therapy Frequency (ACUTE ONLY) min 2x/week  Treatment Duration 2 weeks      CHL IP REASON FOR REFERRAL 02/10/2015  Reason for Referral Objectively evaluate swallowing  function     CHL IP ORAL PHASE 02/10/2015  Oral Phase WFL      CHL IP PHARYNGEAL PHASE 02/10/2015  Pharyngeal Phase Impaired  Pharyngeal Comment pt with overt coughing and moving out of flouro screen  - suspected overt aspiration due to nearly absent muscular contraction      CHL IP CERVICAL ESOPHAGEAL PHASE 02/10/2015  Cervical Esophageal Phase Impaired                 Nectar Teaspoon Reduced cricopharyngeal relaxation           Thin Teaspoon Reduced cricopharyngeal relaxation           Cervical Esophageal Comment nearly absent cp relaxation            Luanna Salk, MS [facility] SLP [phone]     Scheduled Meds: . aspirin  300 mg Rectal Daily   Or  . aspirin  325 mg Oral Daily  . atorvastatin  10 mg Oral q1800  . heparin  5,000 Units Subcutaneous 3 times per day  . insulin aspart  0-15 Units Subcutaneous 4 times per day   Continuous Infusions:   Principal Problem:   CVA (cerebral infarction) Active Problems:   Peripheral neuropathy   Essential hypertension   CKD (chronic kidney disease)   Type 2 diabetes mellitus    Time spent: 30 minutes    Kelvin Cellar  [facility] Pager [phone]. If 7PM-7AM, please contact night-coverage at [email or web address], password [facility] 02/10/2015, 5:00 PM  LOS: 1 day

## 2015-02-11 ENCOUNTER — Ambulatory Visit (HOSPITAL_COMMUNITY): Payer: BC Managed Care – PPO

## 2015-02-11 DIAGNOSIS — R1314 Dysphagia, pharyngoesophageal phase: Secondary | ICD-10-CM

## 2015-02-11 DIAGNOSIS — I6330 Cerebral infarction due to thrombosis of unspecified cerebral artery: Secondary | ICD-10-CM

## 2015-02-11 DIAGNOSIS — E1159 Type 2 diabetes mellitus with other circulatory complications: Secondary | ICD-10-CM

## 2015-02-11 DIAGNOSIS — I639 Cerebral infarction, unspecified: Secondary | ICD-10-CM

## 2015-02-11 LAB — BASIC METABOLIC PANEL
Anion gap: 8 (ref 5–15)
BUN: 22 mg/dL — AB (ref 6–20)
CALCIUM: 8.8 mg/dL — AB (ref 8.9–10.3)
CHLORIDE: 110 mmol/L (ref 101–111)
CO2: 24 mmol/L (ref 22–32)
Creatinine, Ser: 1.31 mg/dL — ABNORMAL HIGH (ref 0.61–1.24)
GFR calc Af Amer: >60 mL/min (ref >60)
GFR, EST NON AFRICAN AMERICAN: 56 mL/min — AB (ref >60)
GLUCOSE: 163 mg/dL — AB (ref 65–99)
POTASSIUM: 3.6 mmol/L (ref 3.5–5.1)
Sodium: 142 mmol/L (ref 135–145)

## 2015-02-11 LAB — GLUCOSE, CAPILLARY
GLUCOSE-CAPILLARY: 157 mg/dL — AB (ref 65–99)
GLUCOSE-CAPILLARY: 170 mg/dL — AB (ref 65–99)
Glucose-Capillary: 151 mg/dL — ABNORMAL HIGH (ref 65–99)
Glucose-Capillary: 160 mg/dL — ABNORMAL HIGH (ref 65–99)
Glucose-Capillary: 207 mg/dL — ABNORMAL HIGH (ref 65–99)
Glucose-Capillary: 221 mg/dL — ABNORMAL HIGH (ref 65–99)

## 2015-02-11 LAB — CBC
HEMATOCRIT: 34.8 % — AB (ref 39.0–52.0)
Hemoglobin: 11.9 g/dL — ABNORMAL LOW (ref 13.0–17.0)
MCH: 29.9 pg (ref 26.0–34.0)
MCHC: 34.2 g/dL (ref 30.0–36.0)
MCV: 87.4 fL (ref 78.0–100.0)
PLATELETS: 253 10*3/uL (ref 150–400)
RBC: 3.98 MIL/uL — AB (ref 4.22–5.81)
RDW: 12.6 % (ref 11.5–15.5)
WBC: 5.4 10*3/uL (ref 4.0–10.5)

## 2015-02-11 LAB — HEMOGLOBIN A1C
HEMOGLOBIN A1C: 7.6 % — AB (ref 4.8–5.6)
Hgb A1c MFr Bld: 7.5 % — ABNORMAL HIGH (ref 4.8–5.6)
Mean Plasma Glucose: 171 mg/dL
Mean Plasma Glucose: 169 mg/dL

## 2015-02-11 MED ORDER — LISINOPRIL 20 MG PO TABS
20.0000 mg | ORAL_TABLET | Freq: Every day | ORAL | Status: DC
  Administered 2015-02-11 – 2015-02-14 (×4): 20 mg via ORAL
  Filled 2015-02-11 (×4): qty 1

## 2015-02-11 MED ORDER — TRAMADOL 5 MG/ML ORAL SUSPENSION: 50.0000 mg | Freq: Four times a day (QID) | ORAL | Status: DC | PRN

## 2015-02-11 MED ORDER — MORPHINE SULFATE 2 MG/ML IJ SOLN
1.0000 mg | INTRAMUSCULAR | Status: DC | PRN
Start: 2015-02-11 — End: 2015-02-14
  Administered 2015-02-11: 1 mg via INTRAVENOUS
  Filled 2015-02-11: qty 1

## 2015-02-11 MED ORDER — TRAMADOL HCL 50 MG PO TABS
50.0000 mg | ORAL_TABLET | Freq: Four times a day (QID) | ORAL | Status: DC | PRN
  Administered 2015-02-11: 50 mg via ORAL
  Filled 2015-02-11: qty 1

## 2015-02-11 MED ORDER — ACETAMINOPHEN 650 MG RE SUPP: 650.0000 mg | Freq: Four times a day (QID) | RECTAL | Status: DC | PRN

## 2015-02-11 MED ORDER — ACETAMINOPHEN 325 MG PO TABS
650.0000 mg | ORAL_TABLET | Freq: Four times a day (QID) | ORAL | Status: DC | PRN
  Administered 2015-02-11: 650 mg via ORAL

## 2015-02-11 NOTE — Progress Notes (Signed)
Rehab Admissions Coordinator Note:  Patient was screened by Cleatrice Burke for appropriateness for an Inpatient Acute Rehab Consult per PT recommendation.  At this time, we are recommending Inpatient Rehab consult. I will contact Dr. Coralyn Pear of order.  Cleatrice Burke 02/11/2015, 10:46 AM  I can be reached at [phone].

## 2015-02-11 NOTE — Consult Note (Signed)
Physical Medicine and Rehabilitation Consult Reason for Consult: Right sided weakness, severe dysphagia Referring Physician: Dr. Coralyn Pear   HPI: Russell Austin is a 64 y.o. male with history of DM type 2 with retinopathy, HTN, glaucoma, who was admitted on 02/09/15 with reports of  heaviness of RUE/RLE with intermittent numbness, pins and needles since waking that am. He underwent colonoscopy that am and after procedure was noted to have difficulty walking as well as difficulty swallowing while in ED.  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  2D echo showed EF 60% with no wall abnormality. Carotid dopplers were negative for significant ICA stenosis. MBS done revealing severe oro-pharyngeal dysphagia. Therefore NPO was recommended and NGT was placed for nutritional support. Dr Erlinda Hong evaluated patient and recommended ASA for left medullary infarct most likely due to small vessel disease. PT evaluation done today revealing ataxic gait with impaired balance. CIR recommended by MD and Rehab team.    Review of Systems  Eyes:       Blind in right eye.       Past Medical History  Diagnosis Date  . Diabetes mellitus type II   . Hypertension   . ED (erectile dysfunction)   . Diabetic retinopathy FOLLOWED BY DR Zadie Rhine  . 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 Glaucoma of both eyes   . Left hydrocele     Past Surgical History  Procedure Laterality Date  . Undescended right testicle removed  1994  . Right eye vitrectomy/ insertion glaucoma seton/ laser repair  12-13-2008    RETINAL ARTERY OCCLUSION /NEOVASCULAR GLAUCOMA/ HEMORRHAGE  . Right eye vitretomy/ insertion glaucoma seton x2/ laser  03-24-2009    RECURRENT HEMORRHAGE/ OCCLUSION INTERNAL SETON  . Left eye  laser retina repair  SEPT 2012  . Appendectomy  AGE EARLY 20'S  . Hydrocele excision  03/31/2012    Procedure: HYDROCELECTOMY ADULT;  Surgeon: Franchot Gallo, MD;  Location: [facility];  Service: Urology;  Laterality: Left;  80 MINS      Family History  Problem Relation Age of Onset  . Glaucoma Mother     Social History:  reports that he has never smoked. He has never used smokeless tobacco. He reports that he does not drink alcohol or use illicit drugs.     Allergies  Allergen Reactions  . Apple Pectin [Pectin] Itching    ITCHY THROAT  . Peach [Prunus Persica] Itching    ITCHY THROAT   Medications Prior to Admission  Medication Sig Dispense Refill  . glipiZIDE (GLUCOTROL XL) 10 MG 24 hr tablet Take 1 tablet (10 mg total) by mouth daily. 90 tablet 3  . hydrochlorothiazide (HYDRODIURIL) 25 MG tablet TAKE ONE TABLET BY MOUTH EVERY DAY 90 tablet 3  . lisinopril (PRINIVIL,ZESTRIL) 40 MG tablet Take 1 tablet (40 mg total) by mouth daily. 90 tablet 3  . metFORMIN (GLUCOPHAGE) 1000 MG tablet Take 500 mg by mouth 2 (two) times daily with a meal.    . metFORMIN (GLUCOPHAGE) 1000 MG tablet 1 by mouth twice a day (Patient not taking: Reported on 02/09/2015) 180 tablet 3    Home: Home Living Family/patient expects to be discharged to:: Private residence Living Arrangements: Spouse/significant other Available Help at Discharge: Family, Available 24 hours/day (could be arranged)  Type of Home: House Home Access: Stairs to enter [employer] of Steps: 1-3 Entrance Stairs-Rails: Left Home Layout: Two level, Bed/bath upstairs, 1/2 bath on main level Alternate Level Stairs-Number of Steps: flight Home Equipment: None  Functional History: Prior Function Level of Independence: Independent 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 Transfers Overall transfer level: Needs  assistance Equipment used: 1 person hand held assist, Rolling walker (2 wheeled) Transfers: Sit to/from Stand, Stand Pivot Transfers Sit to Stand: Min assist Stand pivot transfers: Mod assist General transfer comment: Assist to bring hips up and for balance. Pt ataxic with pivot from recliner to w/c. Ambulation/Gait Ambulation/Gait assistance: Mod assist, +2 safety/equipment Ambulation Distance (Feet): 60 Feet Assistive device: Rolling walker (2 wheeled), 1 person hand held assist General Gait Details: Pt ataxic with rt knee hyperextension.  Gait Pattern/deviations: Step-through pattern, Ataxic, Leaning posteriorly Gait velocity: decr Gait velocity interpretation: Below normal speed for age/gender    ADL:    Cognition: Cognition Overall Cognitive Status: Within Functional Limits for tasks assessed Orientation Level: Oriented X4 Cognition Arousal/Alertness: Awake/alert Behavior During Therapy: WFL for tasks assessed/performed Overall Cognitive Status: Within Functional Limits for tasks assessed  Blood pressure 162/81, pulse 69, temperature 97.7 F (36.5 C), temperature source Oral, resp. rate 16, height 5\' 9"  (1.753 m), weight 72 kg (158 lb 11.7 oz), SpO2 100 %. Physical Exam  Results for orders placed or performed during the hospital encounter of 02/09/15 (from the past 24 hour(s))  Glucose, capillary     Status: Abnormal   Collection Time: 02/10/15  4:49 PM  Result Value Ref Range   Glucose-Capillary 118 (H) 65 - 99 mg/dL  Glucose, capillary     Status: Abnormal   Collection Time: 02/10/15  9:51 PM  Result Value Ref Range   Glucose-Capillary 172 (H) 65 - 99 mg/dL  Glucose, capillary     Status: Abnormal   Collection Time: 02/11/15 12:08 AM  Result Value Ref Range   Glucose-Capillary 221 (H) 65 - 99 mg/dL  Basic metabolic panel     Status: Abnormal   Collection Time: 02/11/15  4:10 AM  Result Value Ref Range   Sodium 142 135 - 145 mmol/L   Potassium 3.6 3.5 - 5.1  mmol/L   Chloride 110 101 - 111 mmol/L   CO2 24 22 - 32 mmol/L   Glucose, Bld 163 (H) 65 - 99 mg/dL   BUN 22 (H) 6 - 20 mg/dL   Creatinine, Ser 1.31 (H) 0.61 - 1.24 mg/dL   Calcium 8.8 (L) 8.9 - 10.3 mg/dL   GFR calc non Af Amer 56 (L) >60 mL/min   GFR calc Af Amer >60 >60 mL/min   Anion gap 8 5 - 15  CBC     Status: Abnormal   Collection Time: 02/11/15  4:10 AM  Result Value Ref Range   WBC 5.4 4.0 - 10.5 K/uL   RBC 3.98 (L) 4.22 - 5.81 MIL/uL   Hemoglobin 11.9 (L) 13.0 - 17.0 g/dL   HCT 34.8 (L) 39.0 - 52.0 %   MCV 87.4 78.0 - 100.0 fL   MCH 29.9 26.0 - 34.0 pg   MCHC 34.2 30.0 - 36.0 g/dL   RDW 12.6 11.5 - 15.5 %   Platelets 253 150 - 400 K/uL  Glucose, capillary     Status: Abnormal   Collection Time: 02/11/15  6:24 AM  Result Value Ref Range   Glucose-Capillary 160 (  H) 65 - 99 mg/dL  Glucose, capillary     Status: Abnormal   Collection Time: 02/11/15  7:57 AM  Result Value Ref Range   Glucose-Capillary 157 (H) 65 - 99 mg/dL  Glucose, capillary     Status: Abnormal   Collection Time: 02/11/15 12:23 PM  Result Value Ref Range   Glucose-Capillary 151 (H) 65 - 99 mg/dL   Dg Abd 1 View  02/10/2015   CLINICAL DATA:  Feeding tube placement.  Initial encounter.  EXAM: ABDOMEN - 1 VIEW  COMPARISON:  None.  FINDINGS: 1632 hours. Feeding tube tip projects over the left upper quadrant of the abdomen consistent with position in the mid stomach. The visualized bowel gas pattern is normal. There is some contrast material within the right colon. No evidence of free air.  IMPRESSION: Feeding tube tip in the mid stomach.   Electronically Signed   By: Richardean Sale M.D.   On: 02/10/2015 16:42   Ct Cervical Spine Wo Contrast  02/09/2015   CLINICAL DATA:  intermittent RIGHT hand numbness that began yesterday. Recent endoscopy.  EXAM: CT CERVICAL SPINE WITHOUT CONTRAST  TECHNIQUE: Multidetector CT imaging of the cervical spine was performed without intravenous contrast. Multiplanar CT image  reconstructions were also generated.  COMPARISON:  CT head earlier today.  FINDINGS: There is no visible cervical spine fracture, traumatic subluxation, prevertebral soft tissue swelling, or intraspinal hematoma. Mild disc space narrowing at C5-6 and C6-7. Multilevel uncinate spurring most pronounced at C6-7, LEFT. No worrisome osseous lesions. Cervicothoracic junction unremarkable. Moderate anterior spurring. Odontoid is intact. No neck masses. Carotid atherosclerosis. Lung apices clear.  IMPRESSION: Cervical spondylosis as described; no acute features.   Electronically Signed   By: Rolla Flatten M.D.   On: 02/09/2015 17:44   Mr Jodene Nam Head Wo Contrast  02/09/2015   CLINICAL DATA:  Brainstem infarction. Difficulty swallowing. Right-sided numbness.  EXAM: MRA HEAD WITHOUT CONTRAST  TECHNIQUE: Angiographic images of the Circle of Willis were obtained using MRA technique without intravenous contrast.  COMPARISON:  MRI brain reported separately.  FINDINGS: The internal carotid arteries are widely patent. The basilar artery is slightly irregular but widely patent. LEFT vertebral is the dominant posterior circulation vessel, but both vertebrals contribute to formation of the basilar.  There is no proximal stenosis of the anterior or middle cerebral arteries. Minor irregularity ambient P2 segment both posterior cerebral arteries up to 50%. No distal vertebral irregularity.  Both PICA vessels are patent. The LEFT AICA vessel is not visualized. The RIGHT AICA vessel is severely diseased proximally. Both superior cerebellar arteries are unremarkable.  IMPRESSION: Intracranial atherosclerotic change as described. No large vessel flow-limiting stenosis or occlusion.  The observed pattern of LEFT paramedian lower pontine and upper medullary infarction is most consistent with small vessel disease or brainstem penetrating perforators.   Electronically Signed   By: Rolla Flatten M.D.   On: 02/09/2015 21:50   Mr Brain Wo  Contrast  02/09/2015   CLINICAL DATA:  Intermittent RIGHT hand numbness. This also involves the RIGHT shoulder and RIGHT leg. Difficulty swallowing.  EXAM: MRI HEAD WITHOUT CONTRAST  TECHNIQUE: Multiplanar, multiecho pulse sequences of the brain and surrounding structures were obtained without intravenous contrast.  COMPARISON:  CT head and cervical spine earlier today.  FINDINGS: There is an acute infarction involving the lower pons and upper medulla on the LEFT. This extends over an AP dimension of approximately 1 cm, rostral caudal extent of approximately 1 cm, and is approximately 5 mm thick.  The infarct is nonhemorrhagic. Early cytotoxic edema can be seen on T2 and FLAIR imaging. No other similar areas of acute infarction.  Generalized atrophy. Mild subcortical and periventricular T2 and FLAIR hyperintensities, likely chronic microvascular ischemic change. Flow voids are maintained throughout the carotid, basilar, and vertebral arteries. There are no areas of chronic hemorrhage. Pituitary, pineal, and cerebellar tonsils unremarkable. No upper cervical lesions. Visualized calvarium, skull base, and upper cervical osseous structures unremarkable. Scalp and extracranial soft tissues, sinuses, and mastoids show no acute process. Chronic deformity of the RIGHT globe, small and shrunken with chronic vitreous hemorrhage and calcification.  IMPRESSION: Acute LEFT paramedian 5 x 10 x 10 mm. lower brainstem infarction. This is nonhemorrhagic.  Chronic changes as described.   Electronically Signed   By: Rolla Flatten M.D.   On: 02/09/2015 21:46   Mr Cervical Spine Wo Contrast  02/09/2015   CLINICAL DATA:  Intermittent RIGHT hand numbness beginning yesterday. Mild RIGHT leg numbness beginning today, difficulty swallowing. History of diabetes, hypertension.  EXAM: MRI CERVICAL SPINE WITHOUT CONTRAST  TECHNIQUE: Multiplanar, multisequence MR imaging of the cervical spine was performed. No intravenous contrast was  administered.  COMPARISON:  CT of the cervical spine February 09, 2015 at 1740 hours.  FINDINGS: Cervical vertebral bodies and posterior elements are intact and aligned, straightened cervical lordosis. Moderate C4-5 through C6-7 disc height loss, with decreased T2 signal within all cervical disc consistent with mild desiccation. Moderate to severe acute on chronic discogenic endplate changes at D34-534 and C5-6. Moderate to severe chronic discogenic endplate changes D34-534. No STIR signal abnormality to suggest acute osseous process.  Cervical spinal cord appears normal morphology and signal characteristics from the cervical medullary junction to level of T3-4, the most caudal well visualized level. Craniocervical junction is intact. Asymmetry of the scalene muscles, the middle and posterior slips on the RIGHT may be developmentally fused versus atrophic posterior slip.  Level by level evaluation:  C2-3: Uncovertebral hypertrophy in moderate facet arthropathy without canal stenosis or neural foraminal narrowing.  C3-4: Uncovertebral hypertrophy. Minimal facet arthropathy without canal stenosis. Mild RIGHT neural foraminal narrowing.  C4-5: 2 mm broad-based disc bulge, uncovertebral hypertrophy and minimal facet arthropathy. Mild to moderate canal stenosis. Moderate RIGHT, moderate to severe LEFT neural foraminal narrowing.  C5-6: 2 mm broad-based disc bulge, uncovertebral hypertrophy and minimal facet arthropathy. Mild canal stenosis. Moderate RIGHT, moderate to severe LEFT neural foraminal narrowing.  C6-7: Small broad-based disc bulge with tiny broad-based LEFT central disc protrusion. Uncovertebral hypertrophy and minimal facet arthropathy. Mild canal stenosis. Mild RIGHT, moderate LEFT neural foraminal narrowing.  C7-T1: No significant disc bulge, canal stenosis or neural foraminal narrowing.  IMPRESSION: Straightened cervical lordosis without acute fracture nor malalignment.  Degenerative change of the cervical spine  resulting in mild to moderate canal stenosis at C4-5, mild at C5-6 and C6-7.  Neural foraminal narrowing C3-4 thru C6-7: Moderate to severe on the LEFT at C4-5 and C6-7.  Asymmetric scalene muscles: Fused RIGHT middle and posterior slips versus atrophic posterior sludge.   Electronically Signed   By: Elon Alas M.D.   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 Does the need for close, 24 hr/day medical supervision in concert with the patient's rehab needs make it unreasonable for this patient to be served in a less intensive setting? Yes 2. Co-Morbidities requiring supervision/potential complications: dysphagia, hypertension 3. Due to bladder management, bowel management, safety, skin/wound care, disease management, medication administration, pain  management and patient education, does the patient require 24 hr/day rehab nursing? Yes 4. Does the patient require coordinated care of a physician, rehab nurse, PT (1-2 hrs/day, 5 days/week), OT (1-2 hrs/day, 5 days/week) and SLP (1-2 hrs/day, 5 days/week) to address physical and functional deficits in the context of the above medical diagnosis(es)? Yes Addressing deficits in the following areas: balance, endurance, locomotion, strength, transferring, bowel/bladder control, bathing, dressing, feeding, grooming, toileting, speech, swallowing and psychosocial support 5. Can the patient actively participate in an intensive therapy program of at least 3 hrs of therapy per day at least 5 days per week? Yes 6. The potential for patient to make measurable gains while on inpatient rehab is excellent 7. Anticipated functional outcomes upon discharge from inpatient rehab are modified independent  with PT, modified independent with OT, modified independent with SLP. 8. Estimated rehab length of stay to reach the above functional goals is: 7-10 days 9. Does the patient have adequate  social supports and living environment to accommodate these discharge functional goals? Yes 10. Anticipated D/C setting: Home 11. Anticipated post D/C treatments: HH therapy and Outpatient therapy 12. Overall Rehab/Functional Prognosis: excellent  RECOMMENDATIONS: This patient's condition is appropriate for continued rehabilitative care in the following setting: CIR Patient has agreed to participate in recommended program. Yes Note that insurance prior authorization may be required for reimbursement for recommended care.  Comment: Rehab Admissions Coordinator to follow up.  Thanks,  Meredith Staggers, MD, Mellody Drown     02/11/2015

## 2015-02-11 NOTE — Progress Notes (Signed)
0122 Pt c/o of feeling restless after receiving morphine, States "Morphine easied the pain but made me feel restless".

## 2015-02-11 NOTE — Progress Notes (Signed)
OT Cancellation Note  Patient Details Name: Russell Austin MRN: [id number] DOB: [birth year]   Cancelled Treatment:    Reason Eval/Treat Not Completed: Patient at procedure or test/ unavailable (in vascular lab) Will return. [place], OTR/L  [id number] 02/11/2015 02/11/2015, 9:12 AM

## 2015-02-11 NOTE — Progress Notes (Signed)
  Echocardiogram 2D Echocardiogram has been performed.  Russell Austin 02/11/2015, 10:45 AM

## 2015-02-11 NOTE — Evaluation (Signed)
Physical Therapy Evaluation Patient Details Name: Russell Austin MRN: [id number] DOB: 08-05-1951 Today's Date: 02/11/2015   History of Present Illness  Pt adm with lt lower brainstem infarct. PMH - HTN, DM, glaucoma  Clinical Impression  Pt admitted with above diagnosis and presents to PT with functional limitations due to deficits listed below (See PT problem list). Pt needs skilled PT to maximize independence and safety to allow discharge to CIR prior to return home. Feel pt will be excellent rehab candidate.     Follow Up Recommendations CIR    Equipment Recommendations  Other (comment) (to be determined)    Recommendations for Other Services Rehab consult     Precautions / Restrictions Precautions Precautions: Fall Precaution Comments: NPO Restrictions Weight Bearing Restrictions: No      Mobility  Bed Mobility               General bed mobility comments: Pt up in chair.  Transfers Overall transfer level: Needs assistance Equipment used: 1 person hand held assist;Rolling walker (2 wheeled) Transfers: Sit to/from [employer] Sit to Stand: Min assist Stand pivot transfers: Mod assist       General transfer comment: Assist to bring hips up and for balance. Pt ataxic with pivot from recliner to w/c.  Ambulation/Gait Ambulation/Gait assistance: Mod assist;+2 safety/equipment Ambulation Distance (Feet): 60 Feet Assistive device: Rolling walker (2 wheeled);1 person hand held assist Gait Pattern/deviations: Step-through pattern;Ataxic;Leaning posteriorly Gait velocity: decr Gait velocity interpretation: Below normal speed for age/gender General Gait Details: Pt ataxic with rt knee hyperextension.   Stairs            Wheelchair Mobility    Modified Rankin (Stroke Patients Only) Modified Rankin (Stroke Patients Only) Pre-Morbid Rankin Score: No symptoms Modified Rankin: Moderately severe disability     Balance Overall balance  assessment: Needs assistance Sitting-balance support: No upper extremity supported;Feet supported Sitting balance-Leahy Scale: Good     Standing balance support: Single extremity supported Standing balance-Leahy Scale: Poor Standing balance comment: Min A for static standing                             Pertinent Vitals/Pain Pain Assessment: No/denies pain    Home Living Family/patient expects to be discharged to:: Private residence Living Arrangements: Spouse/significant other Available Help at Discharge: Family Type of Home: House Home Access: Stairs to enter Entrance Stairs-Rails: Psychiatric nurse of Steps: 1-3 Home Layout: Two level;Bed/bath upstairs;1/2 bath on main level Home Equipment: None      Prior Function Level of Independence: Independent               Hand Dominance   Dominant Hand: Left    Extremity/Trunk Assessment   Upper Extremity Assessment: Defer to OT evaluation           Lower Extremity Assessment: RLE deficits/detail RLE Deficits / Details: grossly 4/5       Communication   Communication: No difficulties  Cognition Arousal/Alertness: Awake/alert Behavior During Therapy: WFL for tasks assessed/performed Overall Cognitive Status: Within Functional Limits for tasks assessed                      General Comments      Exercises        Assessment/Plan    PT Assessment Patient needs continued PT services  PT Diagnosis Hemiplegia non-dominant side;Difficulty walking;Abnormality of gait   PT Problem List Decreased strength;Decreased balance;Decreased mobility;Decreased coordination;Decreased  knowledge of use of DME;Impaired sensation  PT Treatment Interventions DME instruction;Gait training;Stair training;Functional mobility training;Therapeutic activities;Therapeutic exercise;Balance training;Neuromuscular re-education;Patient/family education   PT Goals (Current goals can be found in the Care  Plan section) Acute Rehab PT Goals Patient Stated Goal: Return home PT Goal Formulation: With patient Time For Goal Achievement: 02/25/15 Potential to Achieve Goals: Good    Frequency Min 4X/week   Barriers to discharge        Co-evaluation               End of Session Equipment Utilized During Treatment: Gait belt Activity Tolerance: Patient tolerated treatment well Patient left: Other (comment) (w/c for test) Nurse Communication: Mobility status         Time: [id number] PT Time Calculation (min) (ACUTE ONLY): 22 min   Charges:   PT Evaluation $Initial PT Evaluation Tier I: 1 Procedure     PT G Codes:        Aariz Maish 03-Mar-2015, 9:29 AM  Suanne Marker PT [phone]

## 2015-02-11 NOTE — Progress Notes (Signed)
*  PRELIMINARY RESULTS* Vascular Ultrasound Carotid Duplex (Doppler) has been completed.  Findings suggest 1-39% internal carotid artery stenosis bilaterally. Vertebral arteries are patent with antegrade flow.  02/11/2015 9:28 AM Maudry Mayhew, RVT, RDCS, RDMS

## 2015-02-11 NOTE — Clinical Documentation Improvement (Signed)
  H&P and progress notes indicate a hx of chronic kidney disease. 02/09/15- GFR= 59 02/10/15- GFR > 60 02/11/15- GFR> 60  Please document the stage of CKD if known.  Chronic kidney disease, stage 1- GFR > OR = 90 Chronic kidney disease, stage 2 (mild) - GFR 60-89 Chronic kidney disease, stage 3 (moderate) - GFR 30-59 Chronic kidney disease, stage 4 (severe) - GFR 15-29 Chronic kidney disease, stage 5- GFR < 15 End-stage renal disease (ESRD) Unable to clinically suspect or determine  Thank You,   Pryor Montes, BSN, RN [place] HIM/Clinical Documentation Specialist [email or web address] [phone]

## 2015-02-11 NOTE — Progress Notes (Signed)
STROKE TEAM PROGRESS NOTE   SUBJECTIVE (INTERVAL HISTORY) His wife and friend are at the bedside.  Overall he feels his condition is stable. Still has right sided weakness, but mild. Not able to swallow and PANDA placed. So far on tube feeding.  OBJECTIVE Temp:  [97.7 F (36.5 C)-98.5 F (36.9 C)] 97.7 F (36.5 C) (06/10 1351) Pulse Rate:  [69-91] 69 (06/10 1351) Cardiac Rhythm:  [-]  Resp:  [16-18] 16 (06/10 1351) BP: (151-162)/(73-81) 162/81 mmHg (06/10 1351) SpO2:  [98 %-100 %] 100 % (06/10 1351) Weight:  [158 lb 11.7 oz (72 kg)] 158 lb 11.7 oz (72 kg) (06/10 0537)   Recent Labs Lab 02/10/15 2151 02/11/15 0008 02/11/15 0624 02/11/15 0757 02/11/15 1223  GLUCAP 172* 221* 160* 157* 151*    Recent Labs Lab 02/09/15 1345 02/09/15 1351 02/10/15 0734 02/11/15 0410  NA 136 138 139 142  K 4.1 4.1 3.6 3.6  CL 103 103 107 110  CO2 22  --  24 24  GLUCOSE 262* 261* 110* 163*  BUN 20 23* 18 22*  CREATININE 1.42* 1.30* 1.28* 1.31*  CALCIUM 8.9  --  8.6* 8.8*    Recent Labs Lab 02/09/15 1345 02/10/15 0734  AST 26 22  ALT 22 17  ALKPHOS 81 72  BILITOT 1.8* 1.7*  PROT 6.7 6.3*  ALBUMIN 3.8 3.4*    Recent Labs Lab 02/09/15 1345 02/09/15 1351 02/10/15 0734 02/11/15 0410  WBC 4.7  --  5.3 5.4  NEUTROABS 3.1  --  3.6  --   HGB 13.0 13.9 12.0* 11.9*  HCT 37.0* 41.0 35.4* 34.8*  MCV 85.5  --  86.1 87.4  PLT 279  --  281 253   No results for input(s): CKTOTAL, CKMB, CKMBINDEX, TROPONINI in the last 168 hours.  Recent Labs  02/09/15 1345 02/10/15 0734  LABPROT 13.9 14.0  INR 1.05 1.06   No results for input(s): COLORURINE, LABSPEC, PHURINE, GLUCOSEU, HGBUR, BILIRUBINUR, KETONESUR, PROTEINUR, UROBILINOGEN, NITRITE, LEUKOCYTESUR in the last 72 hours.  Invalid input(s): APPERANCEUR     Component Value Date/Time   CHOL 128 02/10/2015 0547   TRIG 93 02/10/2015 0547   HDL 33* 02/10/2015 0547   CHOLHDL 3.9 02/10/2015 0547   VLDL 19 02/10/2015 0547   LDLCALC  76 02/10/2015 0547   Lab Results  Component Value Date   HGBA1C 7.5* 02/10/2015   No results found for: LABOPIA, COCAINSCRNUR, LABBENZ, AMPHETMU, THCU, LABBARB  No results for input(s): ETH in the last 168 hours.  I have personally reviewed the radiological images below and agree with the radiology interpretations.  Dg Abd 1 View  02/10/2015   IMPRESSION: Feeding tube tip in the mid stomach.     Ct Head (brain) Wo Contrast  02/09/2015   IMPRESSION: 1. No acute intracranial findings. 2. Mild atrophy and white matter microvascular disease. 3. Maxillary sinus inflammation.    Mr Jodene Nam Head Wo Contrast  02/09/2015   IMPRESSION: Intracranial atherosclerotic change as described. No large vessel flow-limiting stenosis or occlusion.  The observed pattern of LEFT paramedian lower pontine and upper medullary infarction is most consistent with small vessel disease or brainstem penetrating perforators.    Mr Brain Wo Contrast  02/09/2015    IMPRESSION: Acute LEFT paramedian 5 x 10 x 10 mm. lower brainstem infarction. This is nonhemorrhagic.  Chronic changes as described.     Mr Cervical Spine Wo Contrast  02/09/2015  IMPRESSION: Straightened cervical lordosis without acute fracture nor malalignment.  Degenerative change of the  cervical spine resulting in mild to moderate canal stenosis at C4-5, mild at C5-6 and C6-7.  Neural foraminal narrowing C3-4 thru C6-7: Moderate to severe on the LEFT at C4-5 and C6-7.  Asymmetric scalene muscles: Fused RIGHT middle and posterior slips versus atrophic posterior sludge.     Carotid Doppler  Bilateral: 1-39% ICA stenosis. Vertebral artery flow is antegrade.   2D Echocardiogram  - Left ventricle: The cavity size was normal. Wall thickness was normal. The estimated ejection fraction was 60%. Wall motion was normal; there were no regional wall motion abnormalities. - Right ventricle: The cavity size was normal. Systolic function was normal. - Impressions: No  cardiac source of embolism was identified, but cannot be ruled out on the basis of this examination. Impressions: - No cardiac source of embolism was identified, but cannot be ruled out on the basis of this examination.  EKG  normal sinus rhythm, unchanged from previous tracings. For complete results please see formal report.  PHYSICAL EXAM  Temp:  [97.7 F (36.5 C)-98.5 F (36.9 C)] 97.7 F (36.5 C) (06/10 1351) Pulse Rate:  [69-91] 69 (06/10 1351) Resp:  [16-18] 16 (06/10 1351) BP: (151-162)/(73-81) 162/81 mmHg (06/10 1351) SpO2:  [98 %-100 %] 100 % (06/10 1351) Weight:  [158 lb 11.7 oz (72 kg)] 158 lb 11.7 oz (72 kg) (06/10 0537)  General - Well nourished, well developed, in no apparent distress.  Ophthalmologic - Sharp disc margins OU.  Cardiovascular - Regular rate and rhythm with no murmur.  Mental Status -  Level of arousal and orientation to time, place, and person were intact. Language including expression, naming, repetition, comprehension was assessed and found intact. Attention span and concentration were normal. Recent and remote memory were intact. Fund of Knowledge was assessed and was intact.  Cranial Nerves II - XII - II - Visual field intact OU. But right eye blindness chronic.  III, IV, VI - Extraocular movements intact. Right eye corneal clouding. V - Facial sensation intact bilaterally. VII - mild right nasolabial fold flattening. VIII - Hearing & vestibular intact bilaterally. X - Palate elevates symmetrically. XI - Chin turning & shoulder shrug intact bilaterally. XII - Tongue protrusion intact.  Motor Strength - The patient's strength was 5-/5 RUE with dexterity difficulty. and 5-/5 RLE and pronator drift was present on the right.  Bulk was normal and fasciculations were absent.   Motor Tone - Muscle tone was assessed at the neck and appendages and was normal.  Reflexes - The patient's reflexes were symmetrical in all extremities and he had no  pathological reflexes.  Sensory - Light touch, temperature/pinprick were assessed and were symmetrical.    Coordination - The patient had normal movements in the hands and feet with no ataxia or dysmetria.  Tremor was absent.  Gait and Station - not tested due to weakness.   ASSESSMENT/PLAN Mr. DALYN BURCZYK is a 64 y.o. male with history of HTN, DM, right eye blind admitted for right sided weakness x 3 days. Symptoms improving.    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 Continue ASA on discharge. Patient counseled to be compliant with his antithrombotic medications Ongoing  aggressive stroke risk factor management Therapy recommendations:  CIR Disposition:  pending   Dysphagia:  MBS showed not safe to swallow  PANDA tube placed  On tube feeding  Diabetes  HgbA1c 7.6 goal < 7.0  Uncontrolled  Currently on lantus  CBG monitoring  SSI  DM education  Hypertension  Home meds:  HCTZ Permissive hypertension (OK if <220/120) for 24-48 hours post stroke and then gradually normalized within 5-7 days. Currently on none  Stable  Hyperlipidemia  Home meds:  none   Currently on lipitor 10mg   LDL 76, goal < 70  Continue statin at discharge  Other Stroke Risk Factor    Other Active Problems  - mild anemia sometimes.  Other Pertinent History    Hospital day # 2  Neurology will sign off. Please call with questions. Pt will follow up with Dr. Erlinda Hong at [facility] in about 2 months. Thanks for the consult.  Rosalin Hawking, MD PhD Stroke Neurology 02/11/2015 2:51 PM    To contact Stroke Continuity provider, please refer to [email or web address] After hours,  contact General Neurology

## 2015-02-11 NOTE — Progress Notes (Signed)
[facility] PROGRESS NOTE  Russell Austin [id number] DOB: 1950/11/16 DOA: 02/09/2015 PCP: Joycelyn Man, MD  Assessment/Plan: 1. Lower brainstem infarction -Patient having multiple cardiac vascular risk factors including type 2 diabetes mellitus, hypertension, chronic kidney disease, presented with complaints of right-sided hemiplegia. Imaging revealed lower brainstem infarct severity compromising swallow function. -NG tube was placed as nutrition was consulted for the initiation of tube feeds. -Continue aspirin at 325 mg by mouth daily along with atorvastatin 10 mg by mouth daily -Transthoracic echocardiogram did not show embolic source, have an EF of 55%. Bilateral carotid Dopplers did not reveal significant stenosis -Physical therapy recommending inpatient rehabilitation, consultation placed  2.  Type 2 diabetes mellitus. -Patient had been on oral hypoglycemic therapy with metformin and glipizide.  -Initiated tube feeds on 02/10/2015 -Started on Lantus at 12 units subcutaneous daily at bedtime. -Blood sugars remain in the 150's  3.  Hypertension. -Will restart lisinopril at 20 mg by mouth daily with cold slowly bring down his blood pressures. Prior to this hospitalization he had been on 40 mg of lisinopril daily.  4.  Dyslipidemia. -Fasting lipid panel showing LDL of 79, not quite at goal given history of CVA. Will start Lipitor 10 mg by mouth daily  Code Status: Full code Family Communication: Spoke with family members present at bedside Disposition Plan: CIR   Consultants:  Neurology    HPI/Subjective: Patient is a pleasant 64 year old gentleman with a past medical history of type 2 diabetes mellitus, hypertension, chronic disease, admitted to the medicine service on 02/09/2015 when he presented with complaints of right-sided hemiparesis. Unfortunately he was out of therapeutic window for thrombolysis. An MRI of brain revealed a lower brainstem infarct.  Neurology was consulted. Speech pathology finding gross sensorimotor esophageal dysphagia with nearly absent pharyngeal contraction. An NG tube was placed as nutrition was consulted for initiating tube feeds.  Objective: Filed Vitals:   02/11/15 1351  BP: 162/81  Pulse: 69  Temp: 97.7 F (36.5 C)  Resp: 16    Intake/Output Summary (Last 24 hours) at 02/11/15 1458 Last data filed at 02/11/15 1324  Gross per 24 hour  Intake      0 ml  Output      0 ml  Net      0 ml   Filed Weights   02/09/15 1323 02/11/15 0537  Weight: 71.033 kg (156 lb 9.6 oz) 72 kg (158 lb 11.7 oz)    Exam:   General:  Patient is in no acute distress, awake, alert, states feeling hungry  Cardiovascular: Regular rate and rhythm normal S1-S2 no murmurs rubs or gallops  Respiratory: Normal respiratory effort, lungs are clear to auscultation bilaterally  Abdomen: Soft nontender nondistended positive bowel sounds  Musculoskeletal: No edema  Neurological: Patient having 3-5 muscle strength to his right upper and right lower extremity, associated with decreased sensation  Data Reviewed: Basic Metabolic Panel:  Recent Labs Lab 02/09/15 1345 02/09/15 1351 02/10/15 0734 02/11/15 0410  NA 136 138 139 142  K 4.1 4.1 3.6 3.6  CL 103 103 107 110  CO2 22  --  24 24  GLUCOSE 262* 261* 110* 163*  BUN 20 23* 18 22*  CREATININE 1.42* 1.30* 1.28* 1.31*  CALCIUM 8.9  --  8.6* 8.8*   Liver Function Tests:  Recent Labs Lab 02/09/15 1345 02/10/15 0734  AST 26 22  ALT 22 17  ALKPHOS 81 72  BILITOT 1.8* 1.7*  PROT 6.7 6.3*  ALBUMIN 3.8 3.4*   No results  for input(s): LIPASE, AMYLASE in the last 168 hours. No results for input(s): AMMONIA in the last 168 hours. CBC:  Recent Labs Lab 02/09/15 1345 02/09/15 1351 02/10/15 0734 02/11/15 0410  WBC 4.7  --  5.3 5.4  NEUTROABS 3.1  --  3.6  --   HGB 13.0 13.9 12.0* 11.9*  HCT 37.0* 41.0 35.4* 34.8*  MCV 85.5  --  86.1 87.4  PLT 279  --  281 253    Cardiac Enzymes: No results for input(s): CKTOTAL, CKMB, CKMBINDEX, TROPONINI in the last 168 hours. BNP (last 3 results) No results for input(s): BNP in the last 8760 hours.  ProBNP (last 3 results) No results for input(s): PROBNP in the last 8760 hours.  CBG:  Recent Labs Lab 02/10/15 2151 02/11/15 0008 02/11/15 0624 02/11/15 0757 02/11/15 1223  GLUCAP 172* 221* 160* 157* 151*    No results found for this or any previous visit (from the past 240 hour(s)).   Studies: Dg Abd 1 View  02/10/2015   CLINICAL DATA:  Feeding tube placement.  Initial encounter.  EXAM: ABDOMEN - 1 VIEW  COMPARISON:  None.  FINDINGS: 1632 hours. Feeding tube tip projects over the left upper quadrant of the abdomen consistent with position in the mid stomach. The visualized bowel gas pattern is normal. There is some contrast material within the right colon. No evidence of free air.  IMPRESSION: Feeding tube tip in the mid stomach.   Electronically Signed   By: Richardean Sale M.D.   On: 02/10/2015 16:42   Ct Cervical Spine Wo Contrast  02/09/2015   CLINICAL DATA:  intermittent RIGHT hand numbness that began yesterday. Recent endoscopy.  EXAM: CT CERVICAL SPINE WITHOUT CONTRAST  TECHNIQUE: Multidetector CT imaging of the cervical spine was performed without intravenous contrast. Multiplanar CT image reconstructions were also generated.  COMPARISON:  CT head earlier today.  FINDINGS: There is no visible cervical spine fracture, traumatic subluxation, prevertebral soft tissue swelling, or intraspinal hematoma. Mild disc space narrowing at C5-6 and C6-7. Multilevel uncinate spurring most pronounced at C6-7, LEFT. No worrisome osseous lesions. Cervicothoracic junction unremarkable. Moderate anterior spurring. Odontoid is intact. No neck masses. Carotid atherosclerosis. Lung apices clear.  IMPRESSION: Cervical spondylosis as described; no acute features.   Electronically Signed   By: Rolla Flatten M.D.   On: 02/09/2015  17:44   Mr Jodene Nam Head Wo Contrast  02/09/2015   CLINICAL DATA:  Brainstem infarction. Difficulty swallowing. Right-sided numbness.  EXAM: MRA HEAD WITHOUT CONTRAST  TECHNIQUE: Angiographic images of the Circle of Willis were obtained using MRA technique without intravenous contrast.  COMPARISON:  MRI brain reported separately.  FINDINGS: The internal carotid arteries are widely patent. The basilar artery is slightly irregular but widely patent. LEFT vertebral is the dominant posterior circulation vessel, but both vertebrals contribute to formation of the basilar.  There is no proximal stenosis of the anterior or middle cerebral arteries. Minor irregularity ambient P2 segment both posterior cerebral arteries up to 50%. No distal vertebral irregularity.  Both PICA vessels are patent. The LEFT AICA vessel is not visualized. The RIGHT AICA vessel is severely diseased proximally. Both superior cerebellar arteries are unremarkable.  IMPRESSION: Intracranial atherosclerotic change as described. No large vessel flow-limiting stenosis or occlusion.  The observed pattern of LEFT paramedian lower pontine and upper medullary infarction is most consistent with small vessel disease or brainstem penetrating perforators.   Electronically Signed   By: Rolla Flatten M.D.   On: 02/09/2015 21:50  Mr Brain Wo Contrast  02/09/2015   CLINICAL DATA:  Intermittent RIGHT hand numbness. This also involves the RIGHT shoulder and RIGHT leg. Difficulty swallowing.  EXAM: MRI HEAD WITHOUT CONTRAST  TECHNIQUE: Multiplanar, multiecho pulse sequences of the brain and surrounding structures were obtained without intravenous contrast.  COMPARISON:  CT head and cervical spine earlier today.  FINDINGS: There is an acute infarction involving the lower pons and upper medulla on the LEFT. This extends over an AP dimension of approximately 1 cm, rostral caudal extent of approximately 1 cm, and is approximately 5 mm thick.  The infarct is nonhemorrhagic.  Early cytotoxic edema can be seen on T2 and FLAIR imaging. No other similar areas of acute infarction.  Generalized atrophy. Mild subcortical and periventricular T2 and FLAIR hyperintensities, likely chronic microvascular ischemic change. Flow voids are maintained throughout the carotid, basilar, and vertebral arteries. There are no areas of chronic hemorrhage. Pituitary, pineal, and cerebellar tonsils unremarkable. No upper cervical lesions. Visualized calvarium, skull base, and upper cervical osseous structures unremarkable. Scalp and extracranial soft tissues, sinuses, and mastoids show no acute process. Chronic deformity of the RIGHT globe, small and shrunken with chronic vitreous hemorrhage and calcification.  IMPRESSION: Acute LEFT paramedian 5 x 10 x 10 mm. lower brainstem infarction. This is nonhemorrhagic.  Chronic changes as described.   Electronically Signed   By: Rolla Flatten M.D.   On: 02/09/2015 21:46   Mr Cervical Spine Wo Contrast  02/09/2015   CLINICAL DATA:  Intermittent RIGHT hand numbness beginning yesterday. Mild RIGHT leg numbness beginning today, difficulty swallowing. History of diabetes, hypertension.  EXAM: MRI CERVICAL SPINE WITHOUT CONTRAST  TECHNIQUE: Multiplanar, multisequence MR imaging of the cervical spine was performed. No intravenous contrast was administered.  COMPARISON:  CT of the cervical spine February 09, 2015 at 1740 hours.  FINDINGS: Cervical vertebral bodies and posterior elements are intact and aligned, straightened cervical lordosis. Moderate C4-5 through C6-7 disc height loss, with decreased T2 signal within all cervical disc consistent with mild desiccation. Moderate to severe acute on chronic discogenic endplate changes at D34-534 and C5-6. Moderate to severe chronic discogenic endplate changes D34-534. No STIR signal abnormality to suggest acute osseous process.  Cervical spinal cord appears normal morphology and signal characteristics from the cervical medullary junction to  level of T3-4, the most caudal well visualized level. Craniocervical junction is intact. Asymmetry of the scalene muscles, the middle and posterior slips on the RIGHT may be developmentally fused versus atrophic posterior slip.  Level by level evaluation:  C2-3: Uncovertebral hypertrophy in moderate facet arthropathy without canal stenosis or neural foraminal narrowing.  C3-4: Uncovertebral hypertrophy. Minimal facet arthropathy without canal stenosis. Mild RIGHT neural foraminal narrowing.  C4-5: 2 mm broad-based disc bulge, uncovertebral hypertrophy and minimal facet arthropathy. Mild to moderate canal stenosis. Moderate RIGHT, moderate to severe LEFT neural foraminal narrowing.  C5-6: 2 mm broad-based disc bulge, uncovertebral hypertrophy and minimal facet arthropathy. Mild canal stenosis. Moderate RIGHT, moderate to severe LEFT neural foraminal narrowing.  C6-7: Small broad-based disc bulge with tiny broad-based LEFT central disc protrusion. Uncovertebral hypertrophy and minimal facet arthropathy. Mild canal stenosis. Mild RIGHT, moderate LEFT neural foraminal narrowing.  C7-T1: No significant disc bulge, canal stenosis or neural foraminal narrowing.  IMPRESSION: Straightened cervical lordosis without acute fracture nor malalignment.  Degenerative change of the cervical spine resulting in mild to moderate canal stenosis at C4-5, mild at C5-6 and C6-7.  Neural foraminal narrowing C3-4 thru C6-7: Moderate to severe on the LEFT at  C4-5 and C6-7.  Asymmetric scalene muscles: Fused RIGHT middle and posterior slips versus atrophic posterior sludge.   Electronically Signed   By: Elon Alas M.D.   On: 02/09/2015 22:01   Dg Swallowing Func-speech Pathology  02/10/2015    Objective Swallowing Evaluation:    Patient Details  Name: Russell Austin MRN: [id number] Date of Birth: 1951/01/30  Today's Date: 02/10/2015 Time: SLP Start Time (ACUTE ONLY): 0825-SLP Stop Time (ACUTE ONLY): 0850 SLP Time Calculation (min)  (ACUTE ONLY): 25 min  Past Medical History:  Past Medical History  Diagnosis Date  . Diabetes mellitus type II   . Hypertension   . ED (erectile dysfunction)   . Diabetic retinopathy FOLLOWED BY DR Zadie Rhine  . 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 Glaucoma of both eyes   . Left hydrocele    Past Surgical History:  Past Surgical History  Procedure Laterality Date  . Undescended right testicle removed  1994  . Right eye vitrectomy/ insertion glaucoma seton/ laser repair  12-13-2008     RETINAL ARTERY OCCLUSION /NEOVASCULAR GLAUCOMA/ HEMORRHAGE  . Right eye vitretomy/ insertion glaucoma seton x2/ laser  03-24-2009    RECURRENT HEMORRHAGE/ OCCLUSION INTERNAL SETON  . Left eye laser retina repair  SEPT 2012  . Appendectomy  AGE EARLY 20'S  . Hydrocele excision  03/31/2012    Procedure: HYDROCELECTOMY ADULT;  Surgeon: Franchot Gallo, MD;   Location: [facility];  Service: Urology;  Laterality:  Left;  36 MINS     HPI:  Other Pertinent Information: 64 yo male with h/o HTN, DM2, neuropathy,  CKD, blind- admitted after colonscopy with dysphagia and right hand/arm  numbness.  Pt failed an RNSSS.  Per MRI pt with left brainstem  nonhemorrhagic cva impacting medulla/pons.  Clinical decision made to  forgo BSE and proceed with MBS due to brainstem cva.    No Data Recorded  Assessment / Plan / Recommendation CHL IP CLINICAL IMPRESSIONS 02/10/2015  Therapy Diagnosis Severe pharyngeal phase dysphagia;Severe cervical  esophageal phase dysphagia  Clinical Impression Pt presents with gross sensorimotor pharyngo=cervical  esophageal dysphagia due to medullary/pons cva.  Nearly absent pharyngel  contraction, laryngeal elevation noted resulting in only 1/8 of tsp thin  transiting into esophagus.  Remainder was retained in pharynx/presumed to  be aspirated- pt moved out of flouro with overt coughing.  Delayed swallow  response to pyriform sinus noted.  At one point, pt stated "I swallowed"  when muscular  contraction was not observed.  Only 2 tsps of liquid  (nectar/thin) provided and testing ceased due to level of dysphagia.    Using live video, educated pt/spouse to findings/recommendations.  As pt  demonstrates adequate oral skills, recommend he swish and expectorate with  water prn for comfort/oral hygeine.  Reviewed importance of oral hygiene  with pt and spouse.   Pt will benefit from consideration for alternative means of nutrition - ?  longer term.  Wife and pt educated using teach back for reinforcement.         No flowsheet data found.   CHL IP DIET RECOMMENDATION 02/10/2015  SLP Diet Recommendations NPO=-swish and expectorate with water prn  Liquid Administration via (None)  Medication Administration Via alternative means  Compensations (None)  Postural Changes and/or Swallow Maneuvers (None)     CHL IP OTHER RECOMMENDATIONS 02/10/2015  Recommended Consults (None)  Oral Care Recommendations Oral care QID  Other Recommendations Have oral suction available     No flowsheet data  found.   CHL IP FREQUENCY AND DURATION 02/10/2015  Speech Therapy Frequency (ACUTE ONLY) min 2x/week  Treatment Duration 2 weeks      CHL IP REASON FOR REFERRAL 02/10/2015  Reason for Referral Objectively evaluate swallowing function     CHL IP ORAL PHASE 02/10/2015  Oral Phase WFL      CHL IP PHARYNGEAL PHASE 02/10/2015  Pharyngeal Phase Impaired  Pharyngeal Comment pt with overt coughing and moving out of flouro screen  - suspected overt aspiration due to nearly absent muscular contraction      CHL IP CERVICAL ESOPHAGEAL PHASE 02/10/2015  Cervical Esophageal Phase Impaired                 Nectar Teaspoon Reduced cricopharyngeal relaxation           Thin Teaspoon Reduced cricopharyngeal relaxation           Cervical Esophageal Comment nearly absent cp relaxation            Luanna Salk, MS [facility] SLP [phone]     Scheduled Meds: . aspirin  300 mg Rectal Daily   Or  . aspirin  325 mg Oral Daily  . atorvastatin  10 mg Oral q1800  . heparin   5,000 Units Subcutaneous 3 times per day  . insulin aspart  0-15 Units Subcutaneous 4 times per day  . insulin glargine  12 Units Subcutaneous QHS   Continuous Infusions: . feeding supplement (JEVITY 1.2 CAL) 1,000 mL (02/11/15 1244)    Principal Problem:   CVA (cerebral infarction) Active Problems:   Peripheral neuropathy   Essential hypertension   CKD (chronic kidney disease)   Type 2 diabetes mellitus   Neck pain    Time spent: 25 minutes    Kelvin Cellar  [facility] Pager [phone]. If 7PM-7AM, please contact night-coverage at [email or web address], password [facility] 02/11/2015, 2:58 PM  LOS: 2 days

## 2015-02-11 NOTE — Progress Notes (Signed)
Occupational Therapy Evaluation Patient Details Name: Russell Austin MRN: [id number] DOB: 06-20-1951 Today's Date: 02/11/2015    History of Present Illness Pt adm with lt lower brainstem infarct. PMH - HTN, DM, glaucoma   Clinical Impression   PTA, pt independent with ADL and mobility. Pt with significant deficits as listed below is an excellent CIR candidate. Very supportive family.  Pt would benefit from CIR to maximize level of independence and facilitate safe return home. Will follow acutely to address established goals and facilitate D/C to CIR.     Follow Up Recommendations  CIR;Supervision/Assistance - 24 hour    Equipment Recommendations  3 in 1 bedside comode    Recommendations for Other Services Rehab consult     Precautions / Restrictions Precautions Precautions: Fall Precaution Comments: NPO      Mobility Bed Mobility Overal bed mobility: Needs Assistance Bed Mobility: Supine to Sit     Supine to sit: Supervision;HOB elevated        Transfers Overall transfer level: Needs assistance Equipment used: 1 person hand held assist Transfers: Sit to/from [facility] Sit to Stand: Min assist Stand pivot transfers: Mod assist       General transfer comment: Assist to maintain midline orientation. Ataxic    Balance     Sitting balance-Leahy Scale: Fair     Standing balance support: During functional activity (R lat lean. corrects with vc) Standing balance-Leahy Scale: Poor Standing balance comment: min A for balance. posteiror sway                            ADL Overall ADL's : Needs assistance/impaired Eating/Feeding: NPO   Grooming: Minimal assistance;Sitting   Upper Body Bathing: Minimal assitance;Sitting   Lower Body Bathing: Moderate assistance;Sit to/from stand   Upper Body Dressing : Moderate assistance;Sitting   Lower Body Dressing: Moderate assistance;Sit to/from stand   Toilet Transfer: Moderate  assistance;Stand-pivot   Toileting- Clothing Manipulation and Hygiene: Moderate assistance       Functional mobility during ADLs: Moderate assistance (not ambulation) General ADL Comments: Affected by ataxic RUE movements, inccordination and balance deficits     Vision Vision Assessment?: No apparent visual deficits (will further assess) Additional Comments: ? nystagmus with lateral gaze to L.    Perception  appears intact   Praxis Praxis Praxis tested?: Within functional limits    Pertinent Vitals/Pain Pain Assessment: 0-10 (just had pain m) Pain Score: 5  Pain Location: R hand and foot Pain Descriptors / Indicators: Aching Pain Intervention(s): Limited activity within patient's tolerance;Premedicated before session     Hand Dominance Left   Extremity/Trunk Assessment Upper Extremity Assessment Upper Extremity Assessment: RUE deficits/detail RUE Deficits / Details: RUE weakness. compensates with trunk flexion to R and shoulder elevation. Difficulty with isolated RUE movement. unable to extend wrust or isolate finger movment. only able to oppose thumb to index finger. Poor uncontrolled movement patterns. improved with visaul feedback. RUE Sensation: decreased proprioception (reports of pain/tingling R hand/foot since CVA) RUE Coordination: decreased fine motor;decreased gross motor (attempting to use as gross assist. Able to pick up large light objects. Difficulty judging sensory input. Absent in hand manipulation skills)   Lower Extremity Assessment Lower Extremity Assessment: Defer to PT evaluation   Cervical / Trunk Assessment Cervical / Trunk Assessment: Other exceptions Cervical / Trunk Exceptions: R bias. Difficulty maintaining midline orientation in sitting standing when distracted   Communication     Cognition Arousal/Alertness: Awake/alert Behavior During Therapy:  WFL for tasks assessed/performed Overall Cognitive Status: Within Functional Limits for tasks  assessed                     General Comments       Exercises Exercises: Other exercises Other Exercises Other Exercises: encouraged use RUE  using visual feedback. Educated on slower controlled movement patterns and always being aware of location of RUE   Shoulder Instructions      Home Living Family/patient expects to be discharged to:: Private residence Living Arrangements: Spouse/significant other Available Help at Discharge: Family;Available 24 hours/day (could be arranged) Type of Home: House Home Access: Stairs to enter [employer] of Steps: 1-3 Entrance Stairs-Rails: Left Home Layout: Two level;Bed/bath upstairs;1/2 bath on main level Alternate Level Stairs-Number of Steps: flight   Bathroom Shower/Tub: Walk-in shower;Door   [employer] Toilet: Standard Bathroom Accessibility: Yes How Accessible: Accessible via walker Home Equipment: None          Prior Functioning/Environment Level of Independence: Independent        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    OT Diagnosis: Generalized weakness;Acute pain;Hemiplegia non-dominant side;Ataxia   OT Problem List: Decreased strength;Decreased range of motion;Decreased activity tolerance;Impaired balance (sitting and/or standing);Decreased coordination;Decreased safety awareness;Decreased knowledge of use of DME or AE;Impaired sensation;Impaired tone;Impaired UE functional use;Pain   OT Treatment/Interventions: Self-care/ADL training;Therapeutic exercise;Neuromuscular education;DME and/or AE instruction;Therapeutic activities;Patient/family education;Balance training    OT Goals(Current goals can be found in the care plan section) Acute Rehab OT Goals Patient Stated Goal: regain independenceq OT Goal Formulation: With patient Time For Goal Achievement: 02/25/15 Potential to Achieve Goals: Good ADL Goals Pt Will Perform Upper Body Bathing: with set-up;with supervision;sitting Pt Will Perform  Lower Body Bathing: with set-up;with supervision;sit to/from stand Pt Will Perform Upper Body Dressing: with set-up;with supervision;sitting Pt Will Perform Lower Body Dressing: with set-up;with supervision;sit to/from stand Pt Will Transfer to Toilet: with supervision;bedside commode Pt Will Perform Toileting - Clothing Manipulation and hygiene: with supervision;sit to/from stand Additional ADL Goal #1: Use R UE as functional assist during ADL tasks  OT Frequency: Min 3X/week   Barriers to D/C:            Co-evaluation              End of Session Nurse Communication: Mobility status  Activity Tolerance: Patient tolerated treatment well Patient left: in bed;with call bell/phone within reach;with family/visitor present   Time: [id number] OT Time Calculation (min): 32 min Charges:  OT General Charges $OT Visit: 1 Procedure OT Evaluation $Initial OT Evaluation Tier I: 1 Procedure OT Treatments $Self Care/Home Management : 8-22 mins G-Codes:    Truth Barot,HILLARY March 04, 2015, 3:52 PM   [facility], OTR/L  [phone] Mar 04, 2015

## 2015-02-12 ENCOUNTER — Inpatient Hospital Stay (HOSPITAL_COMMUNITY): Payer: BC Managed Care – PPO

## 2015-02-12 DIAGNOSIS — E1122 Type 2 diabetes mellitus with diabetic chronic kidney disease: Secondary | ICD-10-CM

## 2015-02-12 DIAGNOSIS — G8191 Hemiplegia, unspecified affecting right dominant side: Secondary | ICD-10-CM | POA: Insufficient documentation

## 2015-02-12 DIAGNOSIS — M6289 Other specified disorders of muscle: Secondary | ICD-10-CM

## 2015-02-12 DIAGNOSIS — N182 Chronic kidney disease, stage 2 (mild): Secondary | ICD-10-CM

## 2015-02-12 LAB — GLUCOSE, CAPILLARY
GLUCOSE-CAPILLARY: 322 mg/dL — AB (ref 65–99)
Glucose-Capillary: 199 mg/dL — ABNORMAL HIGH (ref 65–99)
Glucose-Capillary: 234 mg/dL — ABNORMAL HIGH (ref 65–99)
Glucose-Capillary: 323 mg/dL — ABNORMAL HIGH (ref 65–99)
Glucose-Capillary: 111 mg/dL — ABNORMAL HIGH (ref 65–99)
Glucose-Capillary: 148 mg/dL — ABNORMAL HIGH (ref 65–99)

## 2015-02-12 MED ORDER — INSULIN ASPART 100 UNIT/ML ~~LOC~~ SOLN
0.0000 [IU] | SUBCUTANEOUS | Status: DC
  Administered 2015-02-12: 3 [IU] via SUBCUTANEOUS
  Administered 2015-02-12: 5 [IU] via SUBCUTANEOUS
  Administered 2015-02-12: 11 [IU] via SUBCUTANEOUS
  Administered 2015-02-12: 2 [IU] via SUBCUTANEOUS
  Administered 2015-02-13 (×3): 3 [IU] via SUBCUTANEOUS
  Administered 2015-02-13: 2 [IU] via SUBCUTANEOUS
  Administered 2015-02-13 – 2015-02-14 (×2): 3 [IU] via SUBCUTANEOUS
  Administered 2015-02-14 (×2): 2 [IU] via SUBCUTANEOUS
  Administered 2015-02-14: 5 [IU] via SUBCUTANEOUS
  Administered 2015-02-14: 2 [IU] via SUBCUTANEOUS

## 2015-02-12 MED ORDER — GLUCERNA 1.2 CAL PO LIQD
1000.0000 mL | ORAL | Status: DC
  Filled 2015-02-12 (×2): qty 1000

## 2015-02-12 MED ORDER — GLUCERNA 1.2 CAL PO LIQD
1000.0000 mL | ORAL | Status: DC
  Administered 2015-02-12 – 2015-02-13 (×2): 1000 mL
  Filled 2015-02-12 (×2): qty 1000

## 2015-02-12 MED ORDER — TIZANIDINE HCL 2 MG PO TABS
2.0000 mg | ORAL_TABLET | Freq: Three times a day (TID) | ORAL | Status: DC
  Administered 2015-02-12 – 2015-02-14 (×7): 2 mg via ORAL
  Filled 2015-02-12 (×8): qty 1

## 2015-02-12 MED ORDER — INSULIN GLARGINE 100 UNIT/ML ~~LOC~~ SOLN
20.0000 [IU] | Freq: Every day | SUBCUTANEOUS | Status: DC
  Administered 2015-02-12 – 2015-02-13 (×2): 20 [IU] via SUBCUTANEOUS
  Filled 2015-02-12 (×3): qty 0.2

## 2015-02-12 NOTE — Progress Notes (Signed)
STROKE TEAM PROGRESS NOTE   SUBJECTIVE (INTERVAL HISTORY) Dr Erlinda Hong signed off yesterday. Plan was for inpatient rehabilitation. Dr Coralyn Pear asked me to see the patient today for increased right hemiparesis. Multiple family members present in the room. The patient stated that he noted increased weakness of his right side at about 5 AM this morning. A stat CT scan was ordered which showed no acute abnormality; however, the known brainstem infarct was not well appreciated by the CT scan. Discussed with Dr. Doy Mince. Stat MRI ordered. Discussed the plan with the patient and his family as well as Dr. Coralyn Pear. The patient and his family expressed understanding and had no questions at this time.  OBJECTIVE Temp:  [97.8 F (36.6 C)-98.7 F (37.1 C)] 97.8 F (36.6 C) (06/11 0503) Pulse Rate:  [68-70] 70 (06/11 0503) Cardiac Rhythm:  [-]  Resp:  [12-16] 16 (06/11 0503) BP: (134-150)/(61-71) 150/71 mmHg (06/11 0503) SpO2:  [99 %-100 %] 100 % (06/11 0503) Weight:  [71.3 kg (157 lb 3 oz)] 71.3 kg (157 lb 3 oz) (06/11 0437)   Recent Labs Lab 02/11/15 2042 02/12/15 0002 02/12/15 0415 02/12/15 0808 02/12/15 1224  GLUCAP 207* 322* 199* 234* 323*    Recent Labs Lab 02/09/15 1345 02/09/15 1351 02/10/15 0734 02/11/15 0410  NA 136 138 139 142  K 4.1 4.1 3.6 3.6  CL 103 103 107 110  CO2 22  --  24 24  GLUCOSE 262* 261* 110* 163*  BUN 20 23* 18 22*  CREATININE 1.42* 1.30* 1.28* 1.31*  CALCIUM 8.9  --  8.6* 8.8*    Recent Labs Lab 02/09/15 1345 02/10/15 0734  AST 26 22  ALT 22 17  ALKPHOS 81 72  BILITOT 1.8* 1.7*  PROT 6.7 6.3*  ALBUMIN 3.8 3.4*    Recent Labs Lab 02/09/15 1345 02/09/15 1351 02/10/15 0734 02/11/15 0410  WBC 4.7  --  5.3 5.4  NEUTROABS 3.1  --  3.6  --   HGB 13.0 13.9 12.0* 11.9*  HCT 37.0* 41.0 35.4* 34.8*  MCV 85.5  --  86.1 87.4  PLT 279  --  281 253   No results for input(s): CKTOTAL, CKMB, CKMBINDEX, TROPONINI in the last 168 hours.  Recent Labs   02/10/15 0734  LABPROT 14.0  INR 1.06   No results for input(s): COLORURINE, LABSPEC, PHURINE, GLUCOSEU, HGBUR, BILIRUBINUR, KETONESUR, PROTEINUR, UROBILINOGEN, NITRITE, LEUKOCYTESUR in the last 72 hours.  Invalid input(s): APPERANCEUR     Component Value Date/Time   CHOL 128 02/10/2015 0547   TRIG 93 02/10/2015 0547   HDL 33* 02/10/2015 0547   CHOLHDL 3.9 02/10/2015 0547   VLDL 19 02/10/2015 0547   LDLCALC 76 02/10/2015 0547   Lab Results  Component Value Date   HGBA1C 7.5* 02/10/2015   No results found for: LABOPIA, COCAINSCRNUR, LABBENZ, AMPHETMU, THCU, LABBARB  No results for input(s): ETH in the last 168 hours.  I have personally reviewed the radiological images below and agree with the radiology interpretations.  Dg Abd 1 View  02/10/2015   IMPRESSION: Feeding tube tip in the mid stomach.     Ct Head (brain) Wo Contrast  02/09/2015   IMPRESSION: 1. No acute intracranial findings. 2. Mild atrophy and white matter microvascular disease. 3. Maxillary sinus inflammation.     Stat Ct Head (brain) Wo Contrast 02/12/2015 No acute abnormality is noted. The known brainstem infarct is not well appreciated on this study.   Stat MRI brain without contrast 02/12/2015 Pending   [name]  Head Wo Contrast  02/09/2015   IMPRESSION: Intracranial atherosclerotic change as described. No large vessel flow-limiting stenosis or occlusion.  The observed pattern of LEFT paramedian lower pontine and upper medullary infarction is most consistent with small vessel disease or brainstem penetrating perforators.     Mr Brain Wo Contrast 02/09/2015     IMPRESSION: Acute LEFT paramedian 5 x 10 x 10 mm. lower brainstem infarction. This is nonhemorrhagic.  Chronic changes as described.     Mr Cervical Spine Wo Contrast  02/09/2015  IMPRESSION: Straightened cervical lordosis without acute fracture nor malalignment.  Degenerative change of the cervical spine resulting in mild to moderate canal stenosis  at C4-5, mild at C5-6 and C6-7.  Neural foraminal narrowing C3-4 thru C6-7: Moderate to severe on the LEFT at C4-5 and C6-7.  Asymmetric scalene muscles: Fused RIGHT middle and posterior slips versus atrophic posterior sludge.     Carotid Doppler  Bilateral: 1-39% ICA stenosis. Vertebral artery flow is antegrade.   2D Echocardiogram  - Left ventricle: The cavity size was normal. Wall thickness was normal. The estimated ejection fraction was 60%. Wall motion was normal; there were no regional wall motion abnormalities. - Right ventricle: The cavity size was normal. Systolic function was normal. - Impressions: No cardiac source of embolism was identified, but cannot be ruled out on the basis of this examination. Impressions: - No cardiac source of embolism was identified, but cannot be ruled out on the basis of this examination.  EKG  normal sinus rhythm, unchanged from previous tracings. For complete results please see formal report.  PHYSICAL EXAM per Dr Erlinda Hong 02/11/2015  Temp:  [97.8 F (36.6 C)-98.7 F (37.1 C)] 97.8 F (36.6 C) (06/11 0503) Pulse Rate:  [68-70] 70 (06/11 0503) Resp:  [12-16] 16 (06/11 0503) BP: (134-150)/(61-71) 150/71 mmHg (06/11 0503) SpO2:  [99 %-100 %] 100 % (06/11 0503) Weight:  [71.3 kg (157 lb 3 oz)] 71.3 kg (157 lb 3 oz) (06/11 0437)  General - Well nourished, well developed, in no apparent distress.  Ophthalmologic - Sharp disc margins OU.  Cardiovascular - Regular rate and rhythm with no murmur.  Mental Status -  Level of arousal and orientation to time, place, and person were intact. Language including expression, naming, repetition, comprehension was assessed and found intact. Attention span and concentration were normal. Recent and remote memory were intact. Fund of Knowledge was assessed and was intact.  Cranial Nerves II - XII - II - Visual field intact OU. But right eye blindness chronic.  III, IV, VI - Extraocular movements  intact. Right eye corneal clouding. V - Facial sensation intact bilaterally. VII - mild right nasolabial fold flattening. VIII - Hearing & vestibular intact bilaterally. X - Palate elevates symmetrically. XI - Chin turning & shoulder shrug intact bilaterally. XII - Tongue protrusion intact.  Motor Strength - The patient's strength was 5-/5 RUE with dexterity difficulty. and 5-/5 RLE and pronator drift was present on the right.  Bulk was normal and fasciculations were absent.   Motor Tone - Muscle tone was assessed at the neck and appendages and was normal.  Reflexes - The patient's reflexes were symmetrical in all extremities and he had no pathological reflexes.  Sensory - Light touch, temperature/pinprick were assessed and were symmetrical.    Coordination - The patient had normal movements in the hands and feet with no ataxia or dysmetria.  Tremor was absent.  Gait and Station - not tested due to weakness.   Brief neurologic exam  02/12/2015  Motor Right upper extremity 1/5 proximal strength > than distal Left upper extremity 5/5 Right lower extremity 1/5 Left lower extremity 5/5  Sensory Decreased sensation to light touch home with right hand   ASSESSMENT/PLAN [name] is a 64 y.o. male with history of HTN, DM, right eye blind admitted for right sided weakness x 3 days. Symptoms improving.    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 Continue ASA on discharge. Patient counseled to be compliant with his antithrombotic medications Ongoing aggressive stroke risk factor management Therapy recommendations:  CIR Disposition:   pending   Dysphagia:  MBS showed not safe to swallow  PANDA tube placed  On tube feeding  Diabetes  HgbA1c 7.6 goal < 7.0  Uncontrolled  Currently on lantus  CBG monitoring  SSI  DM education  Hypertension  Home meds:  HCTZ Permissive hypertension (OK if <220/120) for 24-48 hours post stroke and then gradually normalized within 5-7 days. Currently on none  Stable  Hyperlipidemia  Home meds:  none   Currently on lipitor 10mg   LDL 76, goal < 70  Continue statin at discharge  Other Stroke Risk Factor    Other Active Problems  - mild anemia sometimes.  PLAN Dr Erlinda Hong signed off yesterday. Plan was for inpatient rehabilitation. Today at approximately 5 AM the patient noted increased right-sided weakness. Stat head CT as noted above showed no acute changes but infarct area not well appreciated by CT. Discussed with Dr. Doy Mince. Stat MRI ordered. Discussed plan with Dr. Coralyn Pear, the patient and his family.    Hospital day # [place] PA-C [facility] Pager [phone] 02/12/2015, 2:17 PM  I have reviewed the MRI scan which was done stat it shows slight extension of the patient's recent left medullary/pontine infarct. Etiology of which is small vessel disease This is likely due to natural progression and unfortunately there is no specific treatment beyond conservative watch and risk factor modification. Bedrest for 24 hours and mobilize as tolerated after that  Antony Contras, MD To contact Stroke Continuity provider, please refer to [email or web address] After hours, contact General Neurology

## 2015-02-12 NOTE — Progress Notes (Signed)
Pt c/o new onset of tingling and numbness  in his lt hand and fingertips. VSS. Pt has  Positive strong grips on lt hands. On-call provider Jonette Eva, NP notified via text page. 0514 Neuro check remains unchanged . No new orders received at this time. Will cont to monitor.

## 2015-02-12 NOTE — Progress Notes (Signed)
Addendum :  MRI  02/12/2015 No new area of infarction. The acute infarction in the upper left para median medulla has undergone expected evolutionary change, now showing abnormal T2 signal. The area of restricted diffusion is slightly larger than on the previous study, but there is less motion degradation and slight swelling of the infarction could also contribute to this. I cannot completely exclude mild marginal extension, but think of the changes are evolutionary more likely.  Discussed the results of the MRI with Dr. Leonie Man. The plan will be to keep the patient on bed rest for at least 24 hours. No changes in medical regimen.  Will discuss results with the patient and family.  Reevaluate patient in a.m.  Mikey Bussing PA-C [facility] Pager [phone] 02/12/2015, 4:46 PM

## 2015-02-12 NOTE — Progress Notes (Signed)
[facility] PROGRESS NOTE  Russell Austin [id number] DOB: 09-17-1950 DOA: 02/09/2015 PCP: Joycelyn Man, MD  Assessment/Plan: 1. Lower brainstem infarction -Patient having multiple cardiac vascular risk factors including type 2 diabetes mellitus, hypertension, chronic kidney disease, presented with complaints of right-sided hemiplegia. Imaging revealed lower brainstem infarct severity compromising swallow function. -NG tube was placed as nutrition was consulted for the initiation of tube feeds. -Continue aspirin at 325 mg by mouth daily along with atorvastatin 10 mg by mouth daily -Transthoracic echocardiogram did not show embolic source, have an EF of 55%. Bilateral carotid Dopplers did not reveal significant stenosis -Physical therapy recommending inpatient rehabilitation, consultation placed -On 02/12/2015 patient reporting increasing weakness to his right side that started at approx 5 am, a stat CT scan of brain was ordered that did not show hemorrhagic conversion. On my exam he had significant worsening of neurological deficits on right side for which I contacted the stroke team for a reassessment.  -Repeat MRI done, awaiting results.   2.  Type 2 diabetes mellitus with hyperglycemia. -Patient had been on oral hypoglycemic therapy with metformin and glipizide prior to this hospitalization.  -Initiated tube feeds on 02/10/2015 -After starting Jevity he has had elevated blood sugars in the 300's. The was changed to glucerna 1.2 at a rate of 50 mL/hour. -Continue Accuchecks q 4 hours with sliding scale coverage. His lantus was increased to 20 units Stuart q daily   3.  Hypertension. -He was restarted on lisinopril at 20 mg by mouth daily with a goal of slowly bring down his blood pressures. He took 40 mg of Lisinopril in the outpatient setting.   4.  Dyslipidemia. -Fasting lipid panel showing LDL of 79, not quite at goal given history of CVA. Will start Lipitor 10 mg by mouth  daily  5. Nutrition -On tube feeds now with glucerna at 50 mL/hour, await further recommendations from Nutrition.   6. Stage II CKD -Has baseline creatinine of 1.3 -Stable  Code Status: Full code Family Communication: Spoke with family members present at bedside Disposition Plan: CIR   Consultants:  Neurology    HPI/Subjective: Patient is a pleasant 64 year old gentleman with a past medical history of type 2 diabetes mellitus, hypertension, chronic disease, admitted to the medicine service on 02/09/2015 when he presented with complaints of right-sided hemiparesis. Unfortunately he was out of therapeutic window for thrombolysis. An MRI of brain revealed a lower brainstem infarct. Neurology was consulted. Speech pathology finding gross sensorimotor esophageal dysphagia with nearly absent pharyngeal contraction. An NG tube was placed as nutrition was consulted for initiating tube feeds.  Objective: Filed Vitals:   02/12/15 0503  BP: 150/71  Pulse: 70  Temp: 97.8 F (36.6 C)  Resp: 16    Intake/Output Summary (Last 24 hours) at 02/12/15 1632 Last data filed at 02/12/15 0800  Gross per 24 hour  Intake 2396.33 ml  Output    575 ml  Net 1821.33 ml   Filed Weights   02/09/15 1323 02/11/15 0537 02/12/15 0437  Weight: 71.033 kg (156 lb 9.6 oz) 72 kg (158 lb 11.7 oz) 71.3 kg (157 lb 3 oz)    Exam:   General:  Patient is in no acute distress, awake, alert  Cardiovascular: Regular rate and rhythm normal S1-S2 no murmurs rubs or gallops  Respiratory: Normal respiratory effort, lungs are clear to auscultation bilaterally  Abdomen: Soft nontender nondistended positive bowel sounds  Musculoskeletal: No edema  Neurological: Patient now having 1/5 muscle strength to right upper extremity  and 2/5 muscle strength to right lower extremity. This is a change from yesterday's evaluation. There is decreased sensation to right hand  Data Reviewed: Basic Metabolic Panel:  Recent  Labs Lab 02/09/15 1345 02/09/15 1351 02/10/15 0734 02/11/15 0410  NA 136 138 139 142  K 4.1 4.1 3.6 3.6  CL 103 103 107 110  CO2 22  --  24 24  GLUCOSE 262* 261* 110* 163*  BUN 20 23* 18 22*  CREATININE 1.42* 1.30* 1.28* 1.31*  CALCIUM 8.9  --  8.6* 8.8*   Liver Function Tests:  Recent Labs Lab 02/09/15 1345 02/10/15 0734  AST 26 22  ALT 22 17  ALKPHOS 81 72  BILITOT 1.8* 1.7*  PROT 6.7 6.3*  ALBUMIN 3.8 3.4*   No results for input(s): LIPASE, AMYLASE in the last 168 hours. No results for input(s): AMMONIA in the last 168 hours. CBC:  Recent Labs Lab 02/09/15 1345 02/09/15 1351 02/10/15 0734 02/11/15 0410  WBC 4.7  --  5.3 5.4  NEUTROABS 3.1  --  3.6  --   HGB 13.0 13.9 12.0* 11.9*  HCT 37.0* 41.0 35.4* 34.8*  MCV 85.5  --  86.1 87.4  PLT 279  --  281 253   Cardiac Enzymes: No results for input(s): CKTOTAL, CKMB, CKMBINDEX, TROPONINI in the last 168 hours. BNP (last 3 results) No results for input(s): BNP in the last 8760 hours.  ProBNP (last 3 results) No results for input(s): PROBNP in the last 8760 hours.  CBG:  Recent Labs Lab 02/11/15 2042 02/12/15 0002 02/12/15 0415 02/12/15 0808 02/12/15 1224  GLUCAP 207* 322* 199* 234* 323*    No results found for this or any previous visit (from the past 240 hour(s)).   Studies: Dg Abd 1 View  02/10/2015   CLINICAL DATA:  Feeding tube placement.  Initial encounter.  EXAM: ABDOMEN - 1 VIEW  COMPARISON:  None.  FINDINGS: 1632 hours. Feeding tube tip projects over the left upper quadrant of the abdomen consistent with position in the mid stomach. The visualized bowel gas pattern is normal. There is some contrast material within the right colon. No evidence of free air.  IMPRESSION: Feeding tube tip in the mid stomach.   Electronically Signed   By: Richardean Sale M.D.   On: 02/10/2015 16:42   Ct Head Wo Contrast  02/12/2015   CLINICAL DATA:  Increasing right arm weakness  EXAM: CT HEAD WITHOUT CONTRAST   TECHNIQUE: Contiguous axial images were obtained from the base of the skull through the vertex without intravenous contrast.  COMPARISON:  02/09/2015  FINDINGS: The bony calvarium is intact. No findings to suggest acute hemorrhage, acute infarction or space-occupying mass lesion are noted. The known infarct in the brainstem is not well appreciated on this exam.  IMPRESSION: No acute abnormality is noted. The known brainstem infarct is not well appreciated on this study.   Electronically Signed   By: Inez Catalina M.D.   On: 02/12/2015 08:36    Scheduled Meds: . aspirin  300 mg Rectal Daily   Or  . aspirin  325 mg Oral Daily  . atorvastatin  10 mg Oral q1800  . heparin  5,000 Units Subcutaneous 3 times per day  . insulin aspart  0-15 Units Subcutaneous 6 times per day  . insulin glargine  20 Units Subcutaneous QHS  . lisinopril  20 mg Oral Daily  . tiZANidine  2 mg Oral TID   Continuous Infusions: . feeding supplement (GLUCERNA 1.2  CAL)      Principal Problem:   CVA (cerebral infarction) Active Problems:   Peripheral neuropathy   Essential hypertension   CKD (chronic kidney disease)   Type 2 diabetes mellitus   Neck pain   CKD stage 2 due to type 2 diabetes mellitus    Time spent: 83minutes    Kelvin Cellar  [facility] Pager [phone]. If 7PM-7AM, please contact night-coverage at [email or web address], password [facility] 02/12/2015, 4:32 PM  LOS: 3 days

## 2015-02-12 NOTE — Progress Notes (Signed)
Pt reported increased tightness in his rt side from his neck down to the leg. Increased weakness noted in rt hand grip. Dr. Coralyn Pear  made aware and order received for STAT CT of head.  Called to CT tech and notified of STAT order. Report given to oncoming nurse . Will cont to monitor closely.

## 2015-02-13 DIAGNOSIS — I6302 Cerebral infarction due to thrombosis of basilar artery: Secondary | ICD-10-CM | POA: Insufficient documentation

## 2015-02-13 LAB — GLUCOSE, CAPILLARY
GLUCOSE-CAPILLARY: 166 mg/dL — AB (ref 65–99)
GLUCOSE-CAPILLARY: 162 mg/dL — AB (ref 65–99)
Glucose-Capillary: 115 mg/dL — ABNORMAL HIGH (ref 65–99)
Glucose-Capillary: 137 mg/dL — ABNORMAL HIGH (ref 65–99)
Glucose-Capillary: 152 mg/dL — ABNORMAL HIGH (ref 65–99)
Glucose-Capillary: 180 mg/dL — ABNORMAL HIGH (ref 65–99)

## 2015-02-13 NOTE — Progress Notes (Signed)
STROKE TEAM PROGRESS NOTE   SUBJECTIVE (INTERVAL HISTORY) He seems slightly improved today. His able to move his right side now against gravity. His wife is present at the bedside and agrees.  OBJECTIVE Temp:  [98.3 F (36.8 C)-98.4 F (36.9 C)] 98.4 F (36.9 C) (06/11 2129) Pulse Rate:  [70-76] 70 (06/11 2129) Cardiac Rhythm:  [-]  Resp:  [18-20] 20 (06/11 2129) BP: (128-129)/(59-71) 129/59 mmHg (06/11 2129) SpO2:  [99 %-100 %] 99 % (06/11 2129) Weight:  [161 lb 6 oz (73.2 kg)] 161 lb 6 oz (73.2 kg) (06/12 0521)   Recent Labs Lab 02/12/15 1635 02/12/15 2007 02/13/15 0003 02/13/15 0406 02/13/15 0831  GLUCAP 111* 148* 152* 115* 180*    Recent Labs Lab 02/09/15 1345 02/09/15 1351 02/10/15 0734 02/11/15 0410  NA 136 138 139 142  K 4.1 4.1 3.6 3.6  CL 103 103 107 110  CO2 22  --  24 24  GLUCOSE 262* 261* 110* 163*  BUN 20 23* 18 22*  CREATININE 1.42* 1.30* 1.28* 1.31*  CALCIUM 8.9  --  8.6* 8.8*    Recent Labs Lab 02/09/15 1345 02/10/15 0734  AST 26 22  ALT 22 17  ALKPHOS 81 72  BILITOT 1.8* 1.7*  PROT 6.7 6.3*  ALBUMIN 3.8 3.4*    Recent Labs Lab 02/09/15 1345 02/09/15 1351 02/10/15 0734 02/11/15 0410  WBC 4.7  --  5.3 5.4  NEUTROABS 3.1  --  3.6  --   HGB 13.0 13.9 12.0* 11.9*  HCT 37.0* 41.0 35.4* 34.8*  MCV 85.5  --  86.1 87.4  PLT 279  --  281 253   No results for input(s): CKTOTAL, CKMB, CKMBINDEX, TROPONINI in the last 168 hours. No results for input(s): LABPROT, INR in the last 72 hours. No results for input(s): COLORURINE, LABSPEC, [place], GLUCOSEU, HGBUR, BILIRUBINUR, KETONESUR, PROTEINUR, UROBILINOGEN, NITRITE, LEUKOCYTESUR in the last 72 hours.  Invalid input(s): APPERANCEUR     Component Value Date/Time   CHOL 128 02/10/2015 0547   TRIG 93 02/10/2015 0547   HDL 33* 02/10/2015 0547   CHOLHDL 3.9 02/10/2015 0547   VLDL 19 02/10/2015 0547   LDLCALC 76 02/10/2015 0547   Lab Results  Component Value Date   HGBA1C 7.5*  02/10/2015   No results found for: LABOPIA, COCAINSCRNUR, LABBENZ, AMPHETMU, THCU, LABBARB  No results for input(s): ETH in the last 168 hours.  I have personally reviewed the radiological images below and agree with the radiology interpretations.  Dg Abd 1 View  02/10/2015   IMPRESSION: Feeding tube tip in the mid stomach.     Ct Head (brain) Wo Contrast  02/09/2015   IMPRESSION: 1. No acute intracranial findings. 2. Mild atrophy and white matter microvascular disease. 3. Maxillary sinus inflammation.     Stat Ct Head (brain) Wo Contrast 02/12/2015 No acute abnormality is noted. The known brainstem infarct is not well appreciated on this study.   Stat MRI brain without contrast 02/12/2015 Pending   [name] Head Wo Contrast  02/09/2015   IMPRESSION: Intracranial atherosclerotic change as described. No large vessel flow-limiting stenosis or occlusion.  The observed pattern of LEFT paramedian lower pontine and upper medullary infarction is most consistent with small vessel disease or brainstem penetrating perforators.     Mr Brain Wo Contrast 02/09/2015     IMPRESSION: Acute LEFT paramedian 5 x 10 x 10 mm. lower brainstem infarction. This is nonhemorrhagic.  Chronic changes as described.     Mr Cervical Spine Wo  Contrast  02/09/2015  IMPRESSION: Straightened cervical lordosis without acute fracture nor malalignment.  Degenerative change of the cervical spine resulting in mild to moderate canal stenosis at C4-5, mild at C5-6 and C6-7.  Neural foraminal narrowing C3-4 thru C6-7: Moderate to severe on the LEFT at C4-5 and C6-7.  Asymmetric scalene muscles: Fused RIGHT middle and posterior slips versus atrophic posterior sludge.     Carotid Doppler  Bilateral: 1-39% ICA stenosis. Vertebral artery flow is antegrade.   2D Echocardiogram  - Left ventricle: The cavity size was normal. Wall thickness was normal. The estimated ejection fraction was 60%. Wall motion was normal; there were no  regional wall motion abnormalities. - Right ventricle: The cavity size was normal. Systolic function was normal. - Impressions: No cardiac source of embolism was identified, but cannot be ruled out on the basis of this examination. Impressions: - No cardiac source of embolism was identified, but cannot be ruled out on the basis of this examination.  EKG  normal sinus rhythm, unchanged from previous tracings. For complete results please see formal report.  MRI Brain 02/12/15 : No new area of infarction. The acute infarction in the upper left para median medulla has undergone expected evolutionary change  PHYSICAL EXAM    Temp:  [98.3 F (36.8 C)-98.4 F (36.9 C)] 98.4 F (36.9 C) (06/11 2129) Pulse Rate:  [70-76] 70 (06/11 2129) Resp:  [18-20] 20 (06/11 2129) BP: (128-129)/(59-71) 129/59 mmHg (06/11 2129) SpO2:  [99 %-100 %] 99 % (06/11 2129) Weight:  [161 lb 6 oz (73.2 kg)] 161 lb 6 oz (73.2 kg) (06/12 0521)  General - Well nourished, well developed middle-aged African-American male, in no apparent distress.  Ophthalmologic - Sharp disc margins OU.  Cardiovascular - Regular rate and rhythm with no murmur.  Mental Status -  Level of arousal and orientation to time, place, and person were intact. Language including expression, naming, repetition, comprehension was assessed and found intact. Attention span and concentration were normal. Recent and remote memory were intact. Fund of Knowledge was assessed and was intact.  Cranial Nerves II - XII - II - Visual field intact OU. But right eye blindness chronic.with enophthalmos  III, IV, VI - Extraocular movements intact left eye with nystagmus on right gaze. Right eye corneal clouding. V - Facial sensation intact bilaterally. VII - mild right nasolabial fold flattening. VIII - Hearing & vestibular intact bilaterally. X - Palate elevates symmetrically. Good cough XI - Chin turning & shoulder shrug intact bilaterally. XII  - Tongue protrusion intact.  Motor Strength - The patient's strength was 3/5 RUE with hand weakness and weak grip. and 4/5 RLE and pronator drift was present on the right.  Bulk was normal and fasciculations were absent.   Motor Tone - Muscle tone was assessed at the neck and appendages and was normal.  Reflexes - The patient's reflexes were symmetrical in all extremities and he had no pathological reflexes.  Sensory - Light touch, temperature/pinprick were assessed and were diminished on the right side  Coordination - The patient had normal movements in the hands and feet with no ataxia or dysmetria.  Tremor was absent.  Gait and Station - not tested due to weakness.      ASSESSMENT/PLAN [name] is a 64 y.o. male with history of HTN, DM, right eye blind admitted for right sided weakness x 3 days. Symptoms improving.    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 Continue ASA on discharge. Patient counseled to be compliant with his antithrombotic medications Ongoing aggressive stroke risk factor management Therapy recommendations:  CIR Disposition:  pending   Dysphagia:  MBS showed not safe to swallow  PANDA tube placed  On tube feeding  Diabetes  HgbA1c 7.6 goal < 7.0  Uncontrolled  Currently on lantus  CBG monitoring  SSI  DM education  Hypertension  Home meds:  HCTZ Permissive hypertension (OK if <220/120) for 24-48 hours post stroke and then gradually normalized within 5-7 days. Currently on none  Stable  Hyperlipidemia  Home meds:  none   Currently on lipitor 10mg   LDL 76, goal < 70  Continue statin at discharge  Other  Stroke Risk Factor    Other Active Problems  - mild anemia sometimes.  PLAN  I have reviewed the MRI scan which was done 02/12/15 it shows slight extension of the patient's recent left medullary/pontine infarct.but no new area of infarction. Etiology of which is small vessel disease This is likely due to natural progression and unfortunately there is no specific treatment beyond conservative watch and risk factor modification.  mobilize as tolerated with physical therapy. Speech therapy consult for swallow eval for. Transferred to inpatient rehabilitation over the next couple of days. Discussed with patient and wife and answered questions.  Russell Austin, [facility] day # 4     02/13/2015, 11:12 AM   To contact Stroke Continuity provider, please refer to [email or web address] After hours, contact General Neurology

## 2015-02-13 NOTE — Progress Notes (Signed)
Speech Language Pathology Treatment: Dysphagia  Patient Details Name: Russell Austin MRN: [id number] DOB: July 15, 1951 Today's Date: 02/13/2015 Time: [id number] SLP Time Calculation (min) (ACUTE ONLY): 24 min  Assessment / Plan / Recommendation Clinical Impression  Pt continues to exhibit difficulty with swallow initiation, with reduced awareness of difficulties. SLP provided Mod verbal and tactile cues for pt to perform therapeutic exercises, including effortful swallows and mendelsohn maneuver. Pt was not able to complete the [facility] today, but was provided with education and demonstrations to continue his efforts. Single ice chip trials did not elicit overt coughing, but did result in wet vocal quality. Recommend to continue NPO with completion of pharyngeal strengthening exercises. Pt may have a few, single ice chips after oral care as needed to complete exercise program.   HPI Other Pertinent Information: 64 yo male with h/o HTN, DM2, neuropathy, CKD, blind- admitted after colonscopy with dysphagia and right hand/arm numbness.  Pt failed an RNSSS.  Per MRI pt with left brainstem nonhemorrhagic cva impacting medulla/pons.  Clinical decision made to forgo BSE and proceed with MBS due to brainstem cva.     Pertinent Vitals Pain Assessment: No/denies pain  SLP Plan  Continue with current plan of care    Recommendations Diet recommendations: NPO Medication Administration: Via alternative means       Oral Care Recommendations: Oral care QID Follow up Recommendations: Inpatient Rehab Plan: Continue with current plan of care     Germain Osgood, M.A. CCC-SLP [phone]  Germain Osgood 02/13/2015, 11:32 AM

## 2015-02-13 NOTE — Progress Notes (Signed)
[facility] PROGRESS NOTE  Russell Austin [id number] DOB: 07-19-1951 DOA: 02/09/2015 PCP: Russell Man, MD  Assessment/Plan: 1. Lower brainstem infarction -Patient having multiple cardiac vascular risk factors including type 2 diabetes mellitus, hypertension, chronic kidney disease, presented with complaints of right-sided hemiplegia. Imaging revealed lower brainstem infarct severity compromising swallow function. -NG tube was placed as nutrition was consulted for the initiation of tube feeds. -Continue aspirin at 325 mg by mouth daily along with atorvastatin 10 mg by mouth daily -Transthoracic echocardiogram did not show embolic source, have an EF of 55%. Bilateral carotid Dopplers did not reveal significant stenosis -Physical therapy recommending inpatient rehabilitation, consultation placed -On 02/12/2015 patient reporting increasing weakness to his right side that started at approx 5 am, a stat CT scan of brain was ordered that did not show hemorrhagic conversion. On my exam he had significant worsening of neurological deficits on right side for which I contacted the stroke team for a reassessment.  -A repeat MRI performed on 02/12/2015 showed no new area of infarction. Radiology reporting that the acute infarction in the left upper paramedian medullary has undergone expected evolutionary change, with area of restriction diffusion slightly larger than on previous study. -On 02/13/2015 he had clear improvements to his right-sided deficits, now able to grip with his right hand, having 3-5 muscle strength to right upper extremity and 4-5 muscle strength to right lower extremity. -Neurology did not recommend medication changes -Plan to transfer to CIR when bed is available.  2.  Type 2 diabetes mellitus with hyperglycemia. -Patient had been on oral hypoglycemic therapy with metformin and glipizide prior to this hospitalization.  -Initiated tube feeds on 02/10/2015 -After  starting Jevity he has had elevated blood sugars in the 300's. The was changed to glucerna 1.2 at a rate of 50 mL/hour. -His Lantus was increased to 20 units subcutaneous daily -Blood sugars better controlled with blood sugars in the 100 range.  3.  Hypertension. -He was restarted on lisinopril at 20 mg by mouth daily with a goal of slowly bring down his blood pressures. He took 40 mg of Lisinopril in the outpatient setting.   4.  Dyslipidemia. -Fasting lipid panel showing LDL of 79, not quite at goal given history of CVA. Will start Lipitor 10 mg by mouth daily  5. Nutrition -On tube feeds now with glucerna at 50 mL/hour, await further recommendations from Nutrition.  -Patient is being seen by speech pathology for assessment of his swallow function.  6. Stage II CKD -Has baseline creatinine of 1.3 -Stable  Code Status: Full code Family Communication: Spoke with family members present at bedside Disposition Plan: CIR   Consultants:  Neurology    HPI/Subjective: Patient is a pleasant 64 year old gentleman with a past medical history of type 2 diabetes mellitus, hypertension, chronic disease, admitted to the medicine service on 02/09/2015 when he presented with complaints of right-sided hemiparesis. Unfortunately he was out of therapeutic window for thrombolysis. An MRI of brain revealed a lower brainstem infarct. Neurology was consulted. Speech pathology finding gross sensorimotor esophageal dysphagia with nearly absent pharyngeal contraction. An NG tube was placed as nutrition was consulted for initiating tube feeds.  Objective: Filed Vitals:   02/13/15 1352  BP: 143/50  Pulse: 70  Temp: 97.3 F (36.3 C)  Resp: 18    Intake/Output Summary (Last 24 hours) at 02/13/15 1535 Last data filed at 02/13/15 0800  Gross per 24 hour  Intake  647.5 ml  Output      0 ml  Net  647.5 ml   Filed Weights   02/11/15 0537 02/12/15 0437 02/13/15 0521  Weight: 72 kg (158 lb 11.7 oz)  71.3 kg (157 lb 3 oz) 73.2 kg (161 lb 6 oz)    Exam:   General:  Patient is in no acute distress, awake, alert, Panda tube in place.  Cardiovascular: Regular rate and rhythm normal S1-S2 no murmurs rubs or gallops  Respiratory: Normal respiratory effort, lungs are clear to auscultation bilaterally  Abdomen: Soft nontender nondistended positive bowel sounds  Musculoskeletal: No edema  Neurological: Significant improvement to neurological deficits, having 3-5 muscle strength to his right upper extremity and 4-5 muscle strength to his right lower extremity, there is no alteration to sensation  Data Reviewed: Basic Metabolic Panel:  Recent Labs Lab 02/09/15 1345 02/09/15 1351 02/10/15 0734 02/11/15 0410  NA 136 138 139 142  K 4.1 4.1 3.6 3.6  CL 103 103 107 110  CO2 22  --  24 24  GLUCOSE 262* 261* 110* 163*  BUN 20 23* 18 22*  CREATININE 1.42* 1.30* 1.28* 1.31*  CALCIUM 8.9  --  8.6* 8.8*   Liver Function Tests:  Recent Labs Lab 02/09/15 1345 02/10/15 0734  AST 26 22  ALT 22 17  ALKPHOS 81 72  BILITOT 1.8* 1.7*  PROT 6.7 6.3*  ALBUMIN 3.8 3.4*   No results for input(s): LIPASE, AMYLASE in the last 168 hours. No results for input(s): AMMONIA in the last 168 hours. CBC:  Recent Labs Lab 02/09/15 1345 02/09/15 1351 02/10/15 0734 02/11/15 0410  WBC 4.7  --  5.3 5.4  NEUTROABS 3.1  --  3.6  --   HGB 13.0 13.9 12.0* 11.9*  HCT 37.0* 41.0 35.4* 34.8*  MCV 85.5  --  86.1 87.4  PLT 279  --  281 253   Cardiac Enzymes: No results for input(s): CKTOTAL, CKMB, CKMBINDEX, TROPONINI in the last 168 hours. BNP (last 3 results) No results for input(s): BNP in the last 8760 hours.  ProBNP (last 3 results) No results for input(s): PROBNP in the last 8760 hours.  CBG:  Recent Labs Lab 02/12/15 2007 02/13/15 0003 02/13/15 0406 02/13/15 0831 02/13/15 1209  GLUCAP 148* 152* 115* 180* 166*    No results found for this or any previous visit (from the past 240  hour(s)).   Studies: Ct Head Wo Contrast  02/12/2015   CLINICAL DATA:  Increasing right arm weakness  EXAM: CT HEAD WITHOUT CONTRAST  TECHNIQUE: Contiguous axial images were obtained from the base of the skull through the vertex without intravenous contrast.  COMPARISON:  02/09/2015  FINDINGS: The bony calvarium is intact. No findings to suggest acute hemorrhage, acute infarction or space-occupying mass lesion are noted. The known infarct in the brainstem is not well appreciated on this exam.  IMPRESSION: No acute abnormality is noted. The known brainstem infarct is not well appreciated on this study.   Electronically Signed   By: Inez Catalina M.D.   On: 02/12/2015 08:36   Mr Brain Wo Contrast  02/12/2015   CLINICAL DATA:  Worsening right-sided weakness beginning 12 hours ago.  EXAM: MRI HEAD WITHOUT CONTRAST  TECHNIQUE: Multiplanar, multiecho pulse sequences of the brain and surrounding structures were obtained without intravenous contrast.  COMPARISON:  Head CT 02/12/2015.  MRI 02/09/2015.  FINDINGS: Today's study does not suffer from as much motion degradation as the previous exam. Again demonstrated is infarction of the left para median min tele superiorly at the junction with the  pons. The area appears slightly larger, but this may relate to less motion and evolution of the infarction. No new area of infarction is seen. T2 signal abnormality is now present. No evidence of hemorrhage.  Elsewhere, mild chronic small-vessel disease of the cerebral hemispheric white matter is unchanged. No hydrocephalus. 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  IMPRESSION: No new area of infarction. The acute infarction in the upper left para median medulla has undergone expected evolutionary change, now showing abnormal T2 signal. The area of restricted diffusion is slightly larger than on the previous study, but there is less motion degradation and slight swelling of  the infarction could also contribute to this. I cannot completely exclude mild marginal extension, but think of the changes are evolutionary more likely.  These results will be called to the ordering clinician or representative by the Radiologist Assistant, and communication documented in the PACS or zVision Dashboard.   Electronically Signed   By: Nelson Chimes M.D.   On: 02/12/2015 16:37    Scheduled Meds: . aspirin  300 mg Rectal Daily   Or  . aspirin  325 mg Oral Daily  . atorvastatin  10 mg Oral q1800  . heparin  5,000 Units Subcutaneous 3 times per day  . insulin aspart  0-15 Units Subcutaneous 6 times per day  . insulin glargine  20 Units Subcutaneous QHS  . lisinopril  20 mg Oral Daily  . tiZANidine  2 mg Oral TID   Continuous Infusions: . feeding supplement (GLUCERNA 1.2 CAL) 1,000 mL (02/13/15 0800)    Principal Problem:   CVA (cerebral infarction) Active Problems:   Peripheral neuropathy   Essential hypertension   CKD (chronic kidney disease)   Type 2 diabetes mellitus   Neck pain   CKD stage 2 due to type 2 diabetes mellitus   Right sided weakness   Cerebral infarction due to thrombosis of basilar artery    Time spent: 25 minutes    Kelvin Cellar  [facility] Pager [phone]. If 7PM-7AM, please contact night-coverage at [email or web address], password [facility] 02/13/2015, 3:35 PM  LOS: 4 days

## 2015-02-13 NOTE — Evaluation (Signed)
Speech Language Pathology Evaluation Patient Details Name: CHUEYEE KOEPPEL MRN: [id number] DOB: 1951-04-23 Today's Date: 02/13/2015 Time: [id number] SLP Time Calculation (min) (ACUTE ONLY): 24 min  Problem List:  Patient Active Problem List   Diagnosis Date Noted  . Cerebral infarction due to thrombosis of basilar artery   . CKD stage 2 due to type 2 diabetes mellitus 02/12/2015  . Right sided weakness   . CVA (cerebral infarction) 02/10/2015  . CKD (chronic kidney disease) 02/10/2015  . Type 2 diabetes mellitus 02/10/2015  . Neck pain   . Numbness and tingling 02/09/2015  . Back pain, acute 10/30/2012  . Hematuria of undiagnosed cause 10/30/2012  . Proteinuria 10/30/2012  . Left shoulder pain 07/17/2012  . Plantar fasciitis, bilateral 07/17/2012  . HEADACHE 01/07/2009  . Peripheral neuropathy 11/06/2007  . ERECTILE DYSFUNCTION, MILD 11/06/2007  . 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 Essential hypertension 07/08/2007  . EXTRINSIC ASTHMA, UNSPECIFIED 06/25/2007   Past Medical History:  Past Medical History  Diagnosis Date  . Diabetes mellitus type II   . Hypertension   . ED (erectile dysfunction)   . Diabetic retinopathy FOLLOWED BY DR Zadie Rhine  . 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 Glaucoma of both eyes   . Left hydrocele    Past Surgical History:  Past Surgical History  Procedure Laterality Date  . Undescended right testicle removed  1994  . Right eye vitrectomy/ insertion glaucoma seton/ laser repair  12-13-2008    RETINAL ARTERY OCCLUSION /NEOVASCULAR GLAUCOMA/ HEMORRHAGE  . Right eye vitretomy/ insertion glaucoma seton x2/ laser  03-24-2009    RECURRENT HEMORRHAGE/ OCCLUSION INTERNAL SETON  . Left eye laser retina repair  SEPT 2012  . Appendectomy  AGE EARLY 20'S  . Hydrocele excision  03/31/2012    Procedure: HYDROCELECTOMY ADULT;  Surgeon: Franchot Gallo, MD;  Location: [facility];  Service: Urology;  Laterality: Left;  20 MINS     HPI:  64 yo male with h/o HTN, DM2, neuropathy, CKD, blind- admitted after colonscopy with dysphagia and right hand/arm numbness.  Pt failed an RNSSS.  Per MRI pt with left brainstem nonhemorrhagic cva impacting medulla/pons.  Clinical decision made to forgo BSE and proceed with MBS due to brainstem cva.     Assessment / Plan / Recommendation Clinical Impression  Pt has a mild dysarthria marked by imprecise articulation and hyponasality. He also demonstrates mild impairments with retrieval of new information. Basic problem solving, sustained attention, and comprehension appear within functional limits. Recommend SLP f/u for the aforementioned deficits. Pt would also benefit from additional testing of higher level cognitive skills.    SLP Assessment  Patient needs continued Speech Lanaguage Pathology Services    Follow Up Recommendations  Inpatient Rehab    Frequency and Duration min 1 x/week  2 weeks   Pertinent Vitals/Pain Pain Assessment: No/denies pain   SLP Goals  Progression toward goals: Progressing toward goals Patient/Family Stated Goal: wants POs Potential to Achieve Goals (ACUTE ONLY): Good  SLP Evaluation Prior Functioning  Cognitive/Linguistic Baseline: Within functional limits Type of Home: House  Lives With: Spouse   Cognition  Overall Cognitive Status: Impaired/Different from baseline Arousal/Alertness: Awake/alert Orientation Level: Oriented X4 Attention: Sustained Sustained Attention: Appears intact Memory: Impaired Memory Impairment: Retrieval deficit Awareness: Impaired Awareness Impairment: Emergent impairment (in regards to dysphagia) Problem Solving: Appears intact Safety/Judgment: Appears intact    Comprehension  Auditory Comprehension Overall Auditory Comprehension: Appears  within functional limits for tasks assessed    Expression Expression Primary Mode of Expression: Verbal Verbal  Expression Overall Verbal Expression: Appears within functional limits for tasks assessed   Oral / Motor Motor Speech Overall Motor Speech: Impaired Respiration: Within functional limits Phonation: Normal Resonance: Hyponasality Articulation: Impaired Level of Impairment: Conversation Intelligibility: Intelligible Motor Planning: Witnin functional limits Motor Speech Errors: Not applicable      Germain Osgood, M.A. CCC-SLP [phone]  Germain Osgood 02/13/2015, 12:41 PM

## 2015-02-14 ENCOUNTER — Inpatient Hospital Stay (HOSPITAL_COMMUNITY)
Admission: RE | Admit: 2015-02-14 | Discharge: 2015-03-03 | DRG: 057 | Disposition: A | Discharge status: Home / Self Care | Payer: BC Managed Care – PPO | Source: Intra-hospital | Attending: Physical Medicine & Rehabilitation | Admitting: Physical Medicine & Rehabilitation

## 2015-02-14 DIAGNOSIS — E1140 Type 2 diabetes mellitus with diabetic neuropathy, unspecified: Secondary | ICD-10-CM | POA: Diagnosis present

## 2015-02-14 DIAGNOSIS — I129 Hypertensive chronic kidney disease with stage 1 through stage 4 chronic kidney disease, or unspecified chronic kidney disease: Secondary | ICD-10-CM | POA: Diagnosis present

## 2015-02-14 DIAGNOSIS — E1122 Type 2 diabetes mellitus with diabetic chronic kidney disease: Secondary | ICD-10-CM | POA: Diagnosis present

## 2015-02-14 DIAGNOSIS — R1312 Dysphagia, oropharyngeal phase: Secondary | ICD-10-CM | POA: Diagnosis present

## 2015-02-14 DIAGNOSIS — I69322 Dysarthria following cerebral infarction: Secondary | ICD-10-CM

## 2015-02-14 DIAGNOSIS — N179 Acute kidney failure, unspecified: Secondary | ICD-10-CM | POA: Diagnosis not present

## 2015-02-14 DIAGNOSIS — N182 Chronic kidney disease, stage 2 (mild): Secondary | ICD-10-CM | POA: Diagnosis present

## 2015-02-14 DIAGNOSIS — I6302 Cerebral infarction due to thrombosis of basilar artery: Secondary | ICD-10-CM | POA: Diagnosis not present

## 2015-02-14 DIAGNOSIS — E875 Hyperkalemia: Secondary | ICD-10-CM | POA: Diagnosis not present

## 2015-02-14 DIAGNOSIS — H548 Legal blindness, as defined in USA: Secondary | ICD-10-CM | POA: Diagnosis present

## 2015-02-14 DIAGNOSIS — E785 Hyperlipidemia, unspecified: Secondary | ICD-10-CM | POA: Diagnosis present

## 2015-02-14 DIAGNOSIS — H5441 Blindness, right eye, normal vision left eye: Secondary | ICD-10-CM | POA: Diagnosis present

## 2015-02-14 DIAGNOSIS — E11319 Type 2 diabetes mellitus with unspecified diabetic retinopathy without macular edema: Secondary | ICD-10-CM | POA: Diagnosis present

## 2015-02-14 DIAGNOSIS — H409 Unspecified glaucoma: Secondary | ICD-10-CM | POA: Diagnosis present

## 2015-02-14 DIAGNOSIS — I69351 Hemiplegia and hemiparesis following cerebral infarction affecting right dominant side: Principal | ICD-10-CM

## 2015-02-14 DIAGNOSIS — G4700 Insomnia, unspecified: Secondary | ICD-10-CM | POA: Diagnosis present

## 2015-02-14 DIAGNOSIS — G8191 Hemiplegia, unspecified affecting right dominant side: Secondary | ICD-10-CM

## 2015-02-14 DIAGNOSIS — I69391 Dysphagia following cerebral infarction: Secondary | ICD-10-CM | POA: Diagnosis not present

## 2015-02-14 DIAGNOSIS — G8190 Hemiplegia, unspecified affecting unspecified side: Secondary | ICD-10-CM

## 2015-02-14 DIAGNOSIS — F329 Major depressive disorder, single episode, unspecified: Secondary | ICD-10-CM | POA: Diagnosis present

## 2015-02-14 DIAGNOSIS — I69393 Ataxia following cerebral infarction: Secondary | ICD-10-CM | POA: Diagnosis not present

## 2015-02-14 LAB — GLUCOSE, CAPILLARY
GLUCOSE-CAPILLARY: 138 mg/dL — AB (ref 65–99)
GLUCOSE-CAPILLARY: 191 mg/dL — AB (ref 65–99)
Glucose-Capillary: 134 mg/dL — ABNORMAL HIGH (ref 65–99)
Glucose-Capillary: 146 mg/dL — ABNORMAL HIGH (ref 65–99)
Glucose-Capillary: 224 mg/dL — ABNORMAL HIGH (ref 65–99)

## 2015-02-14 MED ORDER — GUAIFENESIN-DM 100-10 MG/5ML PO SYRP: 5.0000 mL | ORAL_SOLUTION | Freq: Four times a day (QID) | ORAL | Status: DC | PRN

## 2015-02-14 MED ORDER — CHLORHEXIDINE GLUCONATE 0.12 % MT SOLN
15.0000 mL | Freq: Two times a day (BID) | OROMUCOSAL | Status: DC
  Administered 2015-02-14 – 2015-03-02 (×31): 15 mL via OROMUCOSAL
  Filled 2015-02-14 (×36): qty 15

## 2015-02-14 MED ORDER — INSULIN GLARGINE 100 UNIT/ML ~~LOC~~ SOLN: 20.0000 [IU] | Freq: Every day | SUBCUTANEOUS | Status: DC

## 2015-02-14 MED ORDER — FLEET ENEMA 7-19 GM/118ML RE ENEM: 1.0000 | ENEMA | Freq: Once | RECTAL | Status: AC | PRN

## 2015-02-14 MED ORDER — GLUCERNA 1.2 CAL PO LIQD
1000.0000 mL | ORAL | Status: DC
  Administered 2015-02-14: 1000 mL
  Filled 2015-02-14 (×2): qty 1000

## 2015-02-14 MED ORDER — CETYLPYRIDINIUM CHLORIDE 0.05 % MT LIQD
7.0000 mL | Freq: Two times a day (BID) | OROMUCOSAL | Status: DC
  Administered 2015-02-15 – 2015-03-01 (×22): 7 mL via OROMUCOSAL

## 2015-02-14 MED ORDER — ONDANSETRON HCL 4 MG PO TABS
4.0000 mg | ORAL_TABLET | Freq: Four times a day (QID) | ORAL | Status: DC | PRN
  Filled 2015-02-14: qty 1

## 2015-02-14 MED ORDER — TRAMADOL HCL 50 MG PO TABS
50.0000 mg | ORAL_TABLET | Freq: Four times a day (QID) | ORAL | Status: DC | PRN
  Administered 2015-02-16 – 2015-02-17 (×2): 50 mg
  Filled 2015-02-14 (×4): qty 1

## 2015-02-14 MED ORDER — ASPIRIN 325 MG PO TABS
325.0000 mg | ORAL_TABLET | Freq: Every day | ORAL | Status: DC
  Administered 2015-02-15 – 2015-03-03 (×17): 325 mg
  Filled 2015-02-14 (×20): qty 1

## 2015-02-14 MED ORDER — TRAMADOL HCL 50 MG PO TABS: 50.0000 mg | ORAL_TABLET | Freq: Four times a day (QID) | ORAL | Status: DC | PRN

## 2015-02-14 MED ORDER — ACETAMINOPHEN 650 MG RE SUPP: 650.0000 mg | Freq: Four times a day (QID) | RECTAL | Status: DC | PRN

## 2015-02-14 MED ORDER — ACETAMINOPHEN 325 MG PO TABS: 650.0000 mg | ORAL_TABLET | ORAL | Status: DC | PRN

## 2015-02-14 MED ORDER — LISINOPRIL 20 MG PO TABS: 20.0000 mg | ORAL_TABLET | Freq: Every day | ORAL | Status: DC

## 2015-02-14 MED ORDER — INSULIN ASPART 100 UNIT/ML ~~LOC~~ SOLN
0.0000 [IU] | Freq: Four times a day (QID) | SUBCUTANEOUS | Status: DC
  Administered 2015-02-15: 3 [IU] via SUBCUTANEOUS
  Administered 2015-02-15: 2 [IU] via SUBCUTANEOUS
  Administered 2015-02-15 (×2): 3 [IU] via SUBCUTANEOUS
  Administered 2015-02-16: 5 [IU] via SUBCUTANEOUS
  Administered 2015-02-16 (×2): 3 [IU] via SUBCUTANEOUS
  Administered 2015-02-16: 5 [IU] via SUBCUTANEOUS
  Administered 2015-02-17: 3 [IU] via SUBCUTANEOUS
  Administered 2015-02-17: 5 [IU] via SUBCUTANEOUS
  Administered 2015-02-17: 3 [IU] via SUBCUTANEOUS
  Administered 2015-02-18: 2 [IU] via SUBCUTANEOUS
  Administered 2015-02-18 – 2015-02-19 (×3): 3 [IU] via SUBCUTANEOUS
  Administered 2015-02-19: 2 [IU] via SUBCUTANEOUS
  Administered 2015-02-19: 3 [IU] via SUBCUTANEOUS
  Administered 2015-02-20: 2 [IU] via SUBCUTANEOUS
  Administered 2015-02-20: 3 [IU] via SUBCUTANEOUS
  Administered 2015-02-21: 2 [IU] via SUBCUTANEOUS
  Administered 2015-02-21 – 2015-02-22 (×3): 3 [IU] via SUBCUTANEOUS
  Administered 2015-02-23: 5 [IU] via SUBCUTANEOUS

## 2015-02-14 MED ORDER — TRAZODONE HCL 50 MG PO TABS
25.0000 mg | ORAL_TABLET | Freq: Every evening | ORAL | Status: DC | PRN
  Administered 2015-02-15 – 2015-02-19 (×4): 50 mg
  Filled 2015-02-14 (×4): qty 1

## 2015-02-14 MED ORDER — SIMETHICONE 40 MG/0.6ML PO SUSP
80.0000 mg | Freq: Four times a day (QID) | ORAL | Status: DC | PRN
  Filled 2015-02-14: qty 1.2

## 2015-02-14 MED ORDER — FREE WATER
200.0000 mL | Freq: Three times a day (TID) | Status: DC
  Administered 2015-02-14 – 2015-02-15 (×3): 200 mL

## 2015-02-14 MED ORDER — ATORVASTATIN CALCIUM 10 MG PO TABS
10.0000 mg | ORAL_TABLET | Freq: Every day | ORAL | Status: DC
  Administered 2015-02-15 – 2015-03-02 (×15): 10 mg
  Filled 2015-02-14 (×17): qty 1

## 2015-02-14 MED ORDER — GLUCERNA 1.2 CAL PO LIQD: 1000.0000 mL | ORAL | Status: DC

## 2015-02-14 MED ORDER — ATORVASTATIN CALCIUM 10 MG PO TABS: 10.0000 mg | ORAL_TABLET | Freq: Every day | ORAL | Status: DC

## 2015-02-14 MED ORDER — ENOXAPARIN SODIUM 40 MG/0.4ML ~~LOC~~ SOLN
40.0000 mg | SUBCUTANEOUS | Status: DC
  Administered 2015-02-14 – 2015-03-02 (×17): 40 mg via SUBCUTANEOUS
  Filled 2015-02-14 (×18): qty 0.4

## 2015-02-14 MED ORDER — INSULIN ASPART 100 UNIT/ML ~~LOC~~ SOLN: 0.0000 [IU] | SUBCUTANEOUS | Status: DC

## 2015-02-14 MED ORDER — LISINOPRIL 20 MG PO TABS
20.0000 mg | ORAL_TABLET | Freq: Every day | ORAL | Status: DC
  Administered 2015-02-15 – 2015-02-21 (×7): 20 mg
  Filled 2015-02-14 (×9): qty 1

## 2015-02-14 MED ORDER — INSULIN GLARGINE 100 UNIT/ML ~~LOC~~ SOLN
20.0000 [IU] | Freq: Every day | SUBCUTANEOUS | Status: DC
  Administered 2015-02-14 – 2015-02-25 (×10): 20 [IU] via SUBCUTANEOUS
  Filled 2015-02-14 (×13): qty 0.2

## 2015-02-14 MED ORDER — TIZANIDINE HCL 2 MG PO TABS
2.0000 mg | ORAL_TABLET | Freq: Two times a day (BID) | ORAL | Status: DC
  Administered 2015-02-15 – 2015-02-23 (×15): 2 mg
  Filled 2015-02-14 (×20): qty 1

## 2015-02-14 MED ORDER — ONDANSETRON HCL 4 MG/2ML IJ SOLN: 4.0000 mg | Freq: Four times a day (QID) | INTRAMUSCULAR | Status: DC | PRN

## 2015-02-14 MED ORDER — BISACODYL 10 MG RE SUPP
10.0000 mg | Freq: Every day | RECTAL | Status: DC | PRN
  Administered 2015-02-17: 10 mg via RECTAL
  Filled 2015-02-14: qty 1

## 2015-02-14 MED ORDER — ASPIRIN 325 MG PO TABS: 325.0000 mg | ORAL_TABLET | Freq: Every day | ORAL | Status: DC

## 2015-02-14 MED ORDER — SENNOSIDES-DOCUSATE SODIUM 8.6-50 MG PO TABS: 1.0000 | ORAL_TABLET | Freq: Every evening | ORAL | Status: DC | PRN

## 2015-02-14 MED ORDER — ALUM & MAG HYDROXIDE-SIMETH 200-200-20 MG/5ML PO SUSP: 30.0000 mL | ORAL | Status: DC | PRN

## 2015-02-14 MED ORDER — GLUCERNA 1.2 CAL PO LIQD
1000.0000 mL | ORAL | Status: DC
  Administered 2015-02-15: 1000 mL
  Filled 2015-02-14 (×4): qty 1000

## 2015-02-14 NOTE — Progress Notes (Signed)
Speech Language Pathology Treatment: Dysphagia  Patient Details Name: Russell Austin MRN: [id number] DOB: 01-02-1951 Today's Date: 02/14/2015 Time: [id number] SLP Time Calculation (min) (ACUTE ONLY): 31 min  Assessment / Plan / Recommendation Clinical Impression  Treatment emphasized facilitation of laryngeal elevation, pharyngeal contraction and tongue base retraction via therapeutic exercise. He required mod-max multimodal cues for execution achieving approximately 30-40% accuracy. Pt exhibited difficulty self monitoring when a full swallow was achieved versus attempts (lingual movements/pumping) possibly due to sensory deficits. SLP provided pt with written exercises and encouraged to perform. He is not ready for repeat MBS. Plans for rehab once insurance approval where he can continue to receive dysphagia therapy in hopes to returning to po's in the near future.     HPI Other Pertinent Information: 64 yo male with h/o HTN, DM2, neuropathy, CKD, blind- admitted after colonscopy with dysphagia and right hand/arm numbness.  Pt failed an RNSSS.  Per MRI pt with left brainstem nonhemorrhagic cva impacting medulla/pons.  Clinical decision made to forgo BSE and proceed with MBS due to brainstem cva.     Pertinent Vitals Pain Assessment: No/denies pain  SLP Plan  Continue with current plan of care    Recommendations Diet recommendations: NPO (ice chips PRN) Medication Administration: Via alternative means              General recommendations: Rehab consult Oral Care Recommendations: Oral care QID Follow up Recommendations: Inpatient Rehab Plan: Continue with current plan of care    GO     Russell Austin 02/14/2015, 3:43 PM   Russell Austin Russell Austin M.Ed [employer] [phone]

## 2015-02-14 NOTE — PMR Pre-admission (Signed)
PMR Admission Coordinator Pre-Admission Assessment  Patient: Russell Austin is an 64 y.o., male MRN: [id number] DOB: Jan 04, 1951 Height: $RemoveBefo'5\' 9"'JZNlipvNaiK$  (175.3 cm) Weight: 70.7 kg (155 lb 13.8 oz)              Insurance Information HMO:     PPO:      PCP:      IPA:      80/20:      OTHER: [employer] PRIMARY: [place]      Policy#: [id number]      Subscriber: wife CM Name: Gasper Sells      Phone#: [phone]     Fax#: [phone] Approval given for 14 days and updates due to Leeann Must (phone: [phone]) at above fax on 9-32-67 Pre-Cert#: [id number]      Employer: Wife's employer is [employer] Benefits:  Phone #: [phone]     Name: [place] [employer]. 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 male with history of DM type 2 with retinopathy, HTN, glaucoma, who was admitted on 02/09/15 with reports of  heaviness of RUE/RLE with intermittent numbness, pins and needles since waking that am. He underwent colonoscopy that am and after procedure was noted to have difficulty walking as well as difficulty swallowing while in ED.  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  2D echo showed EF 60% with no wall abnormality. Carotid dopplers were negative for significant ICA stenosis. MBS done revealing severe oro-pharyngeal dysphagia. Therefore NPO was recommended and NGT was placed for nutritional support. Dr Erlinda Hong evaluated patient and recommended ASA for left medullary infarct most likely due to small vessel disease. PT evaluation done today revealing ataxic gait with impaired balance. CIR recommended by MD and Rehab team.   NIH Total: 3  Past Medical History  Past Medical History  Diagnosis Date  . Diabetes mellitus type II   . Hypertension   . ED (erectile dysfunction)   . Diabetic retinopathy FOLLOWED BY DR Zadie Rhine  . 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 Glaucoma of both eyes   . Left hydrocele     Family History  family history includes Glaucoma in his mother.  Prior Rehab/Hospitalizations:  Has the patient had major surgery during 100 days prior to admission? No  Current Medications   Current facility-administered medications:  .  acetaminophen (TYLENOL) suppository 650 mg, 650 mg, Rectal, Q6H PRN, Gardiner Barefoot, NP .  acetaminophen (TYLENOL) tablet 650 mg, 650 mg, Oral, Q6H PRN, Kelvin Cellar, MD, 650 mg  at 02/11/15 1332 .  aspirin suppository 300 mg, 300 mg, Rectal, Daily, 300 mg at 02/10/15 1042 **OR** aspirin tablet 325 mg, 325 mg, Oral, Daily, Kelvin Cellar, MD, 325 mg at 02/14/15 1015 .  atorvastatin (LIPITOR) tablet 10 mg, 10 mg, Oral, q1800, Kelvin Cellar, MD, 10 mg at 02/13/15 1736 .  feeding supplement (GLUCERNA 1.2 CAL) liquid 1,000 mL, 1,000 mL, Per Tube, Continuous, Jenifer A Williams, RD, Last Rate: 55 mL/hr at 02/14/15 1441, 1,000 mL at 02/14/15 1441 .  heparin injection 5,000 Units, 5,000 Units, Subcutaneous, 3 times per day, Lavina Hamman, MD, 5,000 Units at 02/14/15 1354 .  insulin aspart (novoLOG) injection 0-15 Units, 0-15 Units,  Subcutaneous, 6 times per day, Ritta Slot, NP, 3 Units at 02/14/15 1355 .  insulin glargine (LANTUS) injection 20 Units, 20 Units, Subcutaneous, QHS, Kelvin Cellar, MD, 20 Units at 02/13/15 2112 .  lisinopril (PRINIVIL,ZESTRIL) tablet 20 mg, 20 mg, Oral, Daily, Kelvin Cellar, MD, 20 mg at 02/14/15 1015 .  morphine 2 MG/ML injection 1 mg, 1 mg, Intravenous, Q4H PRN, Gardiner Barefoot, NP, 1 mg at 02/11/15 0058 .  tiZANidine (ZANAFLEX) tablet 2 mg, 2 mg, Oral, TID, Kelvin Cellar, MD, 2 mg at 02/14/15 1015 .  traMADol (ULTRAM) tablet 50 mg, 50 mg, Oral, Q6H PRN, Kelvin Cellar, MD, 50 mg at 02/11/15 1442  Patients Current Diet: Diet NPO time specified, pt with current Panda Diet - low sodium heart healthy  Precautions / Restrictions Precautions Precautions: Fall Precaution Comments: NPO Restrictions Weight Bearing Restrictions: No   Has the patient had 2 or more falls or a fall with injury in the past year?No  Prior Activity Level Community (5-7x/wk): Pt was independent and active prior to his CVA. He got out and drove everyday and he enjoys playing poker with his friends/brother-in-law.  Home Assistive Devices / Equipment Home Assistive Devices/Equipment: None Home Equipment: None  Prior Device Use: Indicate devices/aids used by the patient prior to current illness, exacerbation or injury? None of the above  Prior Functional Level Prior Function Level of Independence: Independent Comments: retired from driving [place]: Did the patient need help bathing, dressing, using the toilet or eating?  Independent  Indoor Mobility: Did the patient need assistance with walking from room to room (with or without device)? Independent  Stairs: Did the patient need assistance with internal or external stairs (with or without device)? Independent  Functional Cognition: Did the patient need help planning regular tasks such as shopping or remembering to take medications?  Independent  Current Functional Level Cognition  Arousal/Alertness: Awake/alert Overall Cognitive Status: Impaired/Different from baseline Orientation Level: Oriented X4 Attention: Sustained Sustained Attention: Appears intact Memory: Impaired Memory Impairment: Retrieval deficit Awareness: Impaired Awareness Impairment: Emergent impairment (in regards to dysphagia) Problem Solving: Appears intact Safety/Judgment: Appears intact    Extremity Assessment (includes Sensation/Coordination)  Upper Extremity Assessment: RUE deficits/detail RUE Deficits / Details: RUE weakness. compensates with trunk flexion to R and shoulder elevation. Difficulty with isolated RUE movement. unable to extend wrust or isolate finger movment. only able to oppose thumb to index finger. Poor uncontrolled movement patterns. improved with visaul feedback. RUE Sensation: decreased proprioception (reports of pain/tingling R hand/foot since CVA) RUE Coordination: decreased fine motor, decreased gross motor (attempting to use as gross assist. Able to pick up large lig)  Lower Extremity Assessment: Defer to PT evaluation RLE Deficits / Details: grossly 4/5 RLE Coordination: decreased gross motor    ADLs  Overall ADL's : Needs  assistance/impaired Eating/Feeding: NPO Grooming: Minimal assistance, Sitting Upper Body Bathing: Minimal assitance, Sitting Lower Body Bathing: Moderate assistance, Sit to/from stand Upper Body Dressing : Moderate assistance, Sitting Lower Body Dressing: Moderate assistance, Sit to/from stand Toilet Transfer: Moderate assistance, Stand-pivot Toileting- Clothing Manipulation and Hygiene: Moderate assistance Functional mobility during ADLs: Moderate assistance (not ambulation) General ADL Comments: Affected by ataxic RUE movements, inccordination and balance deficits    Mobility  Overal bed mobility: Needs Assistance Bed Mobility: Supine to Sit Supine to sit: Supervision, HOB  elevated General bed mobility comments: Pt up in chair.    Transfers  Overall transfer level: Needs assistance Equipment used: 1 person hand held assist Transfers: Sit to/from Stand, Stand Pivot Transfers Sit to Stand: Min assist Stand pivot transfers: Mod assist General transfer comment: Assist to maintain midline orientation. Ataxic    Ambulation / Gait / Stairs / Wheelchair Mobility  Ambulation/Gait Ambulation/Gait assistance: Mod assist, +2 safety/equipment Ambulation Distance (Feet): 60 Feet Assistive device: Rolling walker (2 wheeled), 1 person hand held assist General Gait Details: Pt ataxic with rt knee hyperextension.  Gait Pattern/deviations: Step-through pattern, Ataxic, Leaning posteriorly Gait velocity: decr Gait velocity interpretation: Below normal speed for age/gender    Posture / Balance Balance Overall balance assessment: Needs assistance Sitting-balance support: No upper extremity supported, Feet supported Sitting balance-Leahy Scale: Fair Standing balance support: During functional activity (R lat lean. corrects with vc) Standing balance-Leahy Scale: Poor Standing balance comment: min A for balance. posteiror sway    Special needs/care consideration BiPAP/CPAP no CPM no Continuous Drip IV no Dialysis no          Life Vest no  Oxygen no  Special Bed no  Trach Size no  Wound Vac (area) no       Skin - no current issues                               Bowel mgmt: last BM on 02-09-15 Bladder mgmt: using urinal Diabetic mgmt - yes, managed at home with medications   Previous Home Environment Living Arrangements: Spouse/significant other  Lives With: Spouse Available Help at Discharge: Family, Available 24 hours/day (could be arranged) Type of Home: House Home Layout: Two level, Bed/bath upstairs, 1/2 bath on main level Alternate Level Stairs-Number of Steps: flight Home Access: Stairs to enter Entrance Stairs-Rails: Left Entrance Stairs-Number of Steps:  1-3 Bathroom Shower/Tub: Gaffer, Charity fundraiser: Standard Bathroom Accessibility: Yes How Accessible: Accessible via walker [place]: No  Discharge Living Setting Plans for Discharge Living Setting: Patient's home Type of Home at Discharge: House Discharge Home Layout: Two level, Bed/bath upstairs Alternate Level Stairs-Number of Steps: flight Discharge Home Access: Stairs to enter Entrance Stairs-Rails: Left Entrance Stairs-Number of Steps: 1-3 Discharge Bathroom Shower/Tub: Walk-in shower Discharge Bathroom Toilet: Standard Does the patient have any problems obtaining your medications?: No  Social/Family/Support Systems Patient Roles: Spouse (enjoys playing cards Nurse, children's) with his friends) Sport and exercise psychologist Information: wife Mila Homer is primary contact Anticipated Caregiver: Note goals are for Mod. Ind. (Pt's wife works during the day, but they have supportive friends/local son who can likely help as needed) Anticipated Caregiver's Contact Information: see above Ability/Limitations of Caregiver: wife does work during the day. Other friends/family may be able to help as needed. Caregiver Availability: Intermittent Discharge Plan Discussed with Primary Caregiver: Yes Is Caregiver In Agreement with Plan?: Yes Does Caregiver/Family have Issues with Lodging/Transportation while Pt is in Rehab?: No  Goals/Additional  Needs Patient/Family Goal for Rehab: Mod Ind with PT, OT and SLP Expected length of stay: 7-10 days Cultural Considerations: Christian Dietary Needs: NPO (pt with Panda currently) Equipment Needs: to be determined Pt/Family Agrees to Admission and willing to participate: Yes (spoke with pt and his wife on 02-14-15) Program Orientation Provided & Reviewed with Pt/Caregiver Including Roles  & Responsibilities: Yes   Decrease burden of Care through IP rehab admission: NA   Possible need for SNF placement upon discharge: not anticipated  Patient Condition:  This patient's medical and functional status has changed since the consult dated: 02-11-15 in which the Rehabilitation Physician determined and documented that the patient's condition is appropriate for intensive rehabilitative care in an inpatient rehabilitation facility. See "History of Present Illness" (above) for medical update. Functional changes are: moderate assistance of 2 to ambulate 60' and min to mod assist with self care skills. Patient's medical and functional status update has been discussed with the Rehabilitation physician and patient remains appropriate for inpatient rehabilitation. Will admit to inpatient rehab today.  Preadmission Screen Completed By:  Nanetta Batty, PT, 02/14/2015 3:32 PM ______________________________________________________________________   Discussed status with Dr. Naaman Plummer on 02-14-15 at 1523 and received telephone approval for admission today.  Admission Coordinator:  Nanetta Batty, PT, time 1523/Date 02-14-15

## 2015-02-14 NOTE — H&P (View-Only) (Signed)
Physical Medicine and Rehabilitation Admission H&P    Chief Complaint  Patient presents with  . Right sided weakness with ataxia and severe dysphagia.     HPI:  Russell Austin is a 64 y.o. male with history of DM type 2 with retinopathy, HTN, glaucoma, who was admitted on 02/09/15 with reports of heaviness of RUE/RLE with intermittent numbness, pins and needles since waking that am. He underwent colonoscopy that am and after procedure was noted to have difficulty walking as well as difficulty swallowing while in ED. 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 2D echo showed EF 60% with no wall abnormality. Carotid dopplers were negative for significant ICA stenosis. MBS done revealing severe oro-pharyngeal dysphagia. Therefore NPO was recommended and NGT was placed for nutritional support. Dr Erlinda Hong evaluated patient and recommended ASA for left medullary infarct most likely due to small vessel disease.   On 06/11 am, patient had increase in right sided weakness and MRI of brain was done revealing evolutionary change with slight increase in left paramedian infarct. Neurology felt that this was natural progression and no new recommendations noted. He is showing improvement in right sided weakness with antigravity strength today. Therapy ongoing and CIR was recommended by MD and Rehab team.     Review of Systems  Constitutional: Negative for fever.  HENT: Negative for hearing loss and tinnitus.   Eyes:       Blind in right eye.   Respiratory: Positive for cough and sputum production. Negative for shortness of breath.   Cardiovascular: Negative for chest pain and palpitations.  Gastrointestinal: Positive for constipation. Negative for heartburn, nausea and vomiting.  Genitourinary:  Negative for dysuria, urgency and frequency.  Musculoskeletal: Negative for myalgias and back pain.  Skin: Negative for rash.  Neurological: Positive for sensory change, speech change and focal weakness. Negative for headaches.  Psychiatric/Behavioral: Positive for depression. The patient has insomnia.       Past Medical History  Diagnosis Date  . Diabetes mellitus type II   . Hypertension   . ED (erectile dysfunction)   . Diabetic retinopathy FOLLOWED BY DR Zadie Rhine  . 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 Glaucoma of both eyes   . Left hydrocele     Past Surgical History  Procedure Laterality Date  . Undescended right testicle removed  1994  . Right eye vitrectomy/ insertion glaucoma seton/ laser repair  12-13-2008    RETINAL ARTERY OCCLUSION /NEOVASCULAR GLAUCOMA/ HEMORRHAGE  . Right eye vitretomy/ insertion glaucoma seton x2/ laser  03-24-2009    RECURRENT HEMORRHAGE/ OCCLUSION INTERNAL SETON  . Left eye laser retina repair  SEPT 2012  . Appendectomy  AGE EARLY 20'S  . Hydrocele excision  03/31/2012    Procedure: HYDROCELECTOMY ADULT;  Surgeon: Franchot Gallo, MD;  Location: [facility];  Service: Urology;  Laterality: Left;  25 MINS      Family History  Problem Relation Age of Onset  . Glaucoma Mother     Social History:  Married. Disabled 4 years ago due to loss of vision--used to drive a tractor trailer. Wife works at [employer] and active PTA. He reports that he has never smoked. He has never used smokeless tobacco. He reports that he does not drink alcohol or use illicit drugs.  Allergies  Allergen Reactions  . Apple Pectin [Pectin] Itching    ITCHY THROAT  . Peach [Prunus Persica] Itching    ITCHY THROAT    Medications Prior to Admission  Medication Sig Dispense Refill  . glipiZIDE (GLUCOTROL XL) 10 MG 24 hr tablet Take 1 tablet (10 mg total) by mouth daily. 90 tablet 3  . hydrochlorothiazide (HYDRODIURIL) 25  MG tablet TAKE ONE TABLET BY MOUTH EVERY DAY 90 tablet 3  . lisinopril (PRINIVIL,ZESTRIL) 40 MG tablet Take 1 tablet (40 mg total) by mouth daily. 90 tablet 3  . metFORMIN (GLUCOPHAGE) 1000 MG tablet Take 500 mg by mouth 2 (two) times daily with a meal.    . metFORMIN (GLUCOPHAGE) 1000 MG tablet 1 by mouth twice a day (Patient not taking: Reported on 02/09/2015) 180 tablet 3    Home: Home Living Family/patient expects to be discharged to:: Private residence Living Arrangements: Spouse/significant other Available Help at Discharge: Family, Available 24 hours/day (could be arranged) Type of Home: House Home Access: Stairs to enter [employer] of Steps: 1-3 Entrance Stairs-Rails: Left Home Layout: Two level, Bed/bath upstairs, 1/2 bath on main level Alternate Level Stairs-Number of Steps: flight Home Equipment: None  Lives With: Spouse   Functional History: Prior Function Level of Independence: Independent 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 Transfers Overall transfer level: Needs assistance Equipment used: 1 person hand held assist Transfers: Sit to/from Stand, Stand Pivot Transfers Sit to Stand: Min assist Stand pivot transfers: Mod assist General transfer comment: Assist to maintain midline orientation. Ataxic Ambulation/Gait Ambulation/Gait assistance: Mod assist, +2 safety/equipment Ambulation Distance (Feet): 60 Feet Assistive device: Rolling walker (2 wheeled), 1 person hand held assist General Gait Details: Pt ataxic with rt knee hyperextension.  Gait Pattern/deviations: Step-through pattern, Ataxic, Leaning posteriorly Gait velocity: decr Gait velocity interpretation: Below normal speed for age/gender    ADL: ADL Overall ADL's : Needs  assistance/impaired Eating/Feeding: NPO Grooming: Minimal assistance, Sitting Upper Body Bathing: Minimal assitance, Sitting Lower Body Bathing: Moderate assistance, Sit to/from stand Upper Body Dressing : Moderate assistance, Sitting Lower Body Dressing: Moderate assistance, Sit to/from stand Toilet Transfer: Moderate assistance, Stand-pivot Toileting- Clothing Manipulation and Hygiene: Moderate assistance Functional mobility during ADLs: Moderate assistance (not ambulation) General ADL Comments: Affected by ataxic RUE movements, inccordination and balance deficits  Cognition: Cognition Overall Cognitive Status: Impaired/Different from baseline Arousal/Alertness: Awake/alert Orientation Level: Oriented X4 Attention: Sustained Sustained Attention: Appears intact Memory: Impaired Memory Impairment: Retrieval deficit Awareness: Impaired Awareness Impairment: Emergent impairment (in regards to dysphagia) Problem Solving: Appears intact Safety/Judgment: Appears intact Cognition Arousal/Alertness: Awake/alert Behavior During Therapy: WFL for tasks assessed/performed Overall Cognitive Status: Impaired/Different from baseline   Blood pressure 141/67, pulse 67, temperature 98.2 F (36.8 C), temperature source Oral, resp. rate 16, height 5\' 9"  (1.753 m), weight 70.7 kg (155 lb 13.8 oz), SpO2 100 %. Physical Exam  Nursing note and vitals reviewed. Constitutional: He is oriented to person, place, and time. He appears well-developed and well-nourished.  HENT:  Head: Normocephalic and atraumatic.  Eyes: Right eye exhibits abnormal extraocular motion. Right pupil is not reactive.  Neck: Normal range of motion. Neck supple.  Cardiovascular: Normal rate and regular rhythm.   No murmur heard. Respiratory: Effort normal. No respiratory distress. He has no wheezes.  GI: Soft. Bowel sounds are normal. He exhibits no  distension. There is no tenderness.  Musculoskeletal: He exhibits no edema or  tenderness.  Neurological: He is alert and oriented to person, place, and time.  Voice is stronger today but still has problems with oral secretions. Able to follow one and two step commands without difficulty.  Decrease in motor control on the right. Right hemiparesis RUE1+ to 2- deltoid, bicep, tricep and trace at wrist, hand. RLE: 3/5 hf, 4/5 ke, had, 4/5 ankle dorsi and plantarflexion. Sensation appears grossly intake right arm and leg. Speech dysarthric. Volume good, does have limitations in cough. Right eye blind.   Skin: Skin is warm and dry.  Psychiatric: He has a normal mood and affect. His behavior is normal. Judgment and thought content normal.    Results for orders placed or performed during the hospital encounter of 02/09/15 (from the past 48 hour(s))  Glucose, capillary     Status: Abnormal   Collection Time: 02/12/15  4:35 PM  Result Value Ref Range   Glucose-Capillary 111 (H) 65 - 99 mg/dL  Glucose, capillary     Status: Abnormal   Collection Time: 02/12/15  8:07 PM  Result Value Ref Range   Glucose-Capillary 148 (H) 65 - 99 mg/dL  Glucose, capillary     Status: Abnormal   Collection Time: 02/13/15 12:03 AM  Result Value Ref Range   Glucose-Capillary 152 (H) 65 - 99 mg/dL  Glucose, capillary     Status: Abnormal   Collection Time: 02/13/15  4:06 AM  Result Value Ref Range   Glucose-Capillary 115 (H) 65 - 99 mg/dL  Glucose, capillary     Status: Abnormal   Collection Time: 02/13/15  8:31 AM  Result Value Ref Range   Glucose-Capillary 180 (H) 65 - 99 mg/dL  Glucose, capillary     Status: Abnormal   Collection Time: 02/13/15 12:09 PM  Result Value Ref Range   Glucose-Capillary 166 (H) 65 - 99 mg/dL  Glucose, capillary     Status: Abnormal   Collection Time: 02/13/15  4:30 PM  Result Value Ref Range   Glucose-Capillary 162 (H) 65 - 99 mg/dL  Glucose, capillary     Status: Abnormal   Collection Time: 02/13/15  8:44 PM  Result Value Ref Range   Glucose-Capillary 137  (H) 65 - 99 mg/dL  Glucose, capillary     Status: Abnormal   Collection Time: 02/14/15 12:14 AM  Result Value Ref Range   Glucose-Capillary 134 (H) 65 - 99 mg/dL  Glucose, capillary     Status: Abnormal   Collection Time: 02/14/15  4:16 AM  Result Value Ref Range   Glucose-Capillary 138 (H) 65 - 99 mg/dL  Glucose, capillary     Status: Abnormal   Collection Time: 02/14/15  8:21 AM  Result Value Ref Range   Glucose-Capillary 146 (H) 65 - 99 mg/dL  Glucose, capillary     Status: Abnormal   Collection Time: 02/14/15 12:19 PM  Result Value Ref Range   Glucose-Capillary 191 (H) 65 - 99 mg/dL   Mr Brain Wo Contrast  02/12/2015   CLINICAL DATA:  Worsening right-sided weakness beginning 12 hours ago.  EXAM: MRI HEAD WITHOUT CONTRAST  TECHNIQUE: Multiplanar, multiecho pulse sequences of the brain and surrounding structures were obtained without intravenous contrast.  COMPARISON:  Head CT 02/12/2015.  MRI 02/09/2015.  FINDINGS: Today's study does not suffer from as much motion degradation as the previous exam. Again demonstrated is infarction of the left para median min tele superiorly at the junction with the  pons. The area appears slightly larger, but this may relate to less motion and evolution of the infarction. No new area of infarction is seen. T2 signal abnormality is now present. No evidence of hemorrhage.  Elsewhere, mild chronic small-vessel disease of the cerebral hemispheric white matter is unchanged. No hydrocephalus. 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  IMPRESSION: No new area of infarction. The acute infarction in the upper left para median medulla has undergone expected evolutionary change, now showing abnormal T2 signal. The area of restricted diffusion is slightly larger than on the previous study, but there is less motion degradation and slight swelling of the infarction could also contribute to this. I cannot completely exclude  mild marginal extension, but think of the changes are evolutionary more likely.  These results will be called to the ordering clinician or representative by the Radiologist Assistant, and communication documented in the PACS or zVision Dashboard.   Electronically Signed   By: Nelson Chimes M.D.   On: 02/12/2015 16:37       Medical Problem List and Plan: 1. 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  DVT Prophylaxis/Anticoagulation: Pharmaceutical: Lovenox 3. Pain Management: N/A 4. Mood: LCSW to follow for evaluation and support. Has good family support.  5. Neuropsych: This patient is capable of making decisions on his own behalf. 6. Skin/Wound Care: Routine pressure relief measures.  7. Fluids/Electrolytes/Nutrition: Continue tube feeds for nutritional support. Add water flushes. Check lytes in am.  8. HTN: Monitor BP every 8 hours. Continue Prinivil daily.  9. Severe dysphagia: NPO. Will have dietician assist with nutritional needs as complaining of hunger--had colonoscopy last Wed.      10. DM type 2: Hgb A1C-7.6. Was on metformin 1000 mg bid with Glucotrol XL 10 mg daily. Now on Lantus due to continuous tube feeds.  11. CKD: Baseline Cr- 1.3.  Check lytes in am. Continue to hold HCTZ for now. 12. Dyslipidemia: continue Lipitor.    Post Admission Physician Evaluation: 1. 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 Patient is admitted to receive collaborative, interdisciplinary care between the physiatrist, rehab nursing staff, and therapy team. 3. Patient's level of medical complexity and substantial therapy needs in context of that medical necessity cannot be provided at a lesser intensity of care such as a SNF. 4. Patient has experienced substantial functional loss from his/her baseline which was documented above under the "Functional History" and "Functional Status"  headings.  Judging by the patient's diagnosis, physical exam, and functional history, the patient has potential for functional progress which will result in measurable gains while on inpatient rehab.  These gains will be of substantial and practical use upon discharge  in facilitating mobility and self-care at the household level. 5. Physiatrist will provide 24 hour management of medical needs as well as oversight of the therapy plan/treatment and provide guidance as appropriate regarding the interaction of the two. 6. 24 hour rehab nursing will assist with bladder management, bowel management, safety, skin/wound care, disease management, medication administration, pain management and patient education  and help integrate therapy concepts, techniques,education, etc. 7. PT will assess and treat for/with: Lower extremity strength, range of motion, stamina, balance, functional mobility, safety, adaptive techniques and equipment, NMR, visual-perceptual awareness, ego support, community reintegration.   Goals are: mod I to supervision. 8. OT will assess and treat for/with: ADL's, functional mobility, safety, upper extremity strength, adaptive  techniques and equipment, NMR, cognitive perceptual awareness, stroke education.   Goals are: mod I to supervision. Therapy may proceed with showering this patient. 9. SLP will assess and treat for/with:  communication, and swallowing. .  Goals are: mod I to supervision (if swallowing doesn't advance). 10. Case Management and Social Worker will assess and treat for psychological issues and discharge planning. 11. Team conference will be held weekly to assess progress toward goals and to determine barriers to discharge. 12. Patient will receive at least 3 hours of therapy per day at least 5 days per week. 13. ELOS: 15-18 days       14. Prognosis:  excellent     Meredith Staggers, MD, [facility] 02/14/2015   02/14/2015

## 2015-02-14 NOTE — Progress Notes (Signed)
Rehab admissions - I tried to meet with pt earlier today, but he was using [facility]. I returned this pm to share details about our rehab program to pt, his wife and two friends. Pt and his wife are interested in pursuing inpatient rehab. Further questions were answered and informational brochures were given.  Pt has [place] and we will need insurance authorization to consider possible inpatient rehab admit. I have started the insurance authorization and will share updates when available.  We will consider possible inpatient rehab admit pending his medical clearance, insurance authorization and our bed availability.  Please call me with any questions. Thanks.  Nanetta Batty, PT Rehabilitation Admissions Coordinator [phone]

## 2015-02-14 NOTE — Discharge Summary (Signed)
Physician Discharge Summary  Russell Austin [id number] DOB: [birth year] DOA: 02/09/2015  PCP: Joycelyn Man, MD  Admit date: 02/09/2015 Discharge date: 02/14/2015  Time spent: 35 minutes  Recommendations for Outpatient Follow-up:  1. Please follow up on swallow function with SLP consult in acute rehab. He was discharged with a Panda Tube.  2. Follow up on blood pressures.   Discharge Diagnoses:  Principal Problem:   CVA (cerebral infarction) Active Problems:   Peripheral neuropathy   Essential hypertension   CKD (chronic kidney disease)   Type 2 diabetes mellitus   Neck pain   CKD stage 2 due to type 2 diabetes mellitus   Right sided weakness   Cerebral infarction due to thrombosis of basilar artery   Discharge Condition: Stable  Diet recommendation: Tube feeds with Glucerna 1.2 at 65 ml/hour  Filed Weights   02/12/15 0437 02/13/15 0521 02/14/15 0321  Weight: 71.3 kg (157 lb 3 oz) 73.2 kg (161 lb 6 oz) 70.7 kg (155 lb 13.8 oz)    History of present illness:  Russell Austin is a 64 y.o. male with Past medical history of diabetes mellitus type 2, hypertension, chronic kidney disease, diabetic retinopathy, glaucoma with right eye visual loss peripheral neuropathy. The patient is presenting with complaints of right-sided numbness. He mentions that when he woke up from the sleep this morning he started having complains of numbness of the right side. He went for a colonoscopy today and after the procedure he started having increasing numbness of the right leg as well. He was having difficulty ambulating. He started having a sensation of choking as well. He has some difficulty as well. He denies any dizziness or lightheadedness or vertigo. Next and he mentions he has chronically difficulty measuring distance with his left eye secondary to right eye visual loss. He denies any chest pain fever or chills diarrhea nausea vomiting. Denies any recent change in his  medications.  The patient is coming from home. And at his baseline independent for most of his ADL.  Hospital Course:  Patient is a pleasant 64 year old gentleman with a past medical history of type 2 diabetes mellitus, hypertension, chronic disease, admitted to the medicine service on 02/09/2015 when he presented with complaints of right-sided hemiparesis. Unfortunately he was out of therapeutic window for thrombolysis. An MRI of brain revealed a lower brainstem infarct. Neurology was consulted. Speech pathology finding gross sensorimotor esophageal dysphagia with nearly absent pharyngeal contraction. An NG tube was placed as nutrition was consulted for initiating tube feeds.   Lower brainstem infarction -Patient having multiple cardiac vascular risk factors including type 2 diabetes mellitus, hypertension, chronic kidney disease, presented with complaints of right-sided hemiplegia. Imaging revealed lower brainstem infarct severity compromising swallow function. -NG tube was placed as nutrition was consulted for the initiation of tube feeds. -Continue aspirin at 325 mg by mouth daily along with atorvastatin 10 mg by mouth daily -Transthoracic echocardiogram did not show embolic source, have an EF of 55%. Bilateral carotid Dopplers did not reveal significant stenosis -Physical therapy recommending inpatient rehabilitation, consultation placed -On 02/12/2015 patient reporting increasing weakness to his right side that started at approx 5 am, a stat CT scan of brain was ordered that did not show hemorrhagic conversion. On my exam he had significant worsening of neurological deficits on right side for which I contacted the stroke team for a reassessment.  -A repeat MRI performed on 02/12/2015 showed no new area of infarction. Radiology reporting that the acute infarction in the left  upper paramedian medullary has undergone expected evolutionary change, with area of restriction diffusion slightly larger  than on previous study. -On 02/13/2015 he had clear improvements to his right-sided deficits, now able to grip with his right hand, having 3-5 muscle strength to right upper extremity and 4-5 muscle strength to right lower extremity. -Neurology did not recommend medication changes -Meanwhile speech reassessed patient and recommended discharging patient to CIR with NG tube. Will need ongoing SLP evaluation while at rehab.  -Plan to transfer to CIR when bed is available.   2. Type 2 diabetes mellitus with hyperglycemia. -Patient had been on oral hypoglycemic therapy with metformin and glipizide prior to this hospitalization.  -Initiated tube feeds on 02/10/2015 -After starting Jevity he has had elevated blood sugars in the 300's. The was changed to glucerna 1.2 at a rate of 50 mL/hour. -His Lantus was increased to 20 units subcutaneous daily -Blood sugars better controlled with blood sugars in the 100 range.  3. Hypertension. -He was restarted on lisinopril at 20 mg by mouth daily with a goal of slowly bring down his blood pressures. He took 40 mg of Lisinopril in the outpatient setting.   4. Dyslipidemia. -Fasting lipid panel showing LDL of 79, not quite at goal given history of CVA. Will start Lipitor 10 mg by mouth daily  5. Nutrition -On tube feeds now with glucerna at 65 mL/hour -Patient is being seen by speech pathology for assessment of his swallow function.  6. Stage II CKD -Has baseline creatinine of 1.3 -Stable   Consultations:  Neurology  Discharge Exam: Filed Vitals:   02/14/15 1437  BP: 141/67  Pulse: 67  Temp:   Resp: 16     General: Patient is in no acute distress, awake, alert, Panda tube in place.  Cardiovascular: Regular rate and rhythm normal S1-S2 no murmurs rubs or gallops  Respiratory: Normal respiratory effort, lungs are clear to auscultation bilaterally  Abdomen: Soft nontender nondistended positive bowel sounds  Musculoskeletal: No  edema  Neurological: Has 3-5 muscle strength to his right upper extremity and 4-5 muscle strength to his right lower extremity, there is no alteration to sensation  Discharge Instructions   Discharge Instructions    Ambulatory referral to Neurology    Complete by:  As directed   Pt will follow up with Dr. Erlinda Hong at [facility] in about 2 months. Thanks.     Call MD for:  difficulty breathing, headache or visual disturbances    Complete by:  As directed      Call MD for:  extreme fatigue    Complete by:  As directed      Call MD for:  hives    Complete by:  As directed      Call MD for:  persistant dizziness or light-headedness    Complete by:  As directed      Call MD for:  persistant nausea and vomiting    Complete by:  As directed      Call MD for:  redness, tenderness, or signs of infection (pain, swelling, redness, odor or green/yellow discharge around incision site)    Complete by:  As directed      Call MD for:  severe uncontrolled pain    Complete by:  As directed      Call MD for:  temperature >100.4    Complete by:  As directed      Call MD for:    Complete by:  As directed  Diet - low sodium heart healthy    Complete by:  As directed      Increase activity slowly    Complete by:  As directed           Current Discharge Medication List    START taking these medications   Details  acetaminophen (TYLENOL) 650 MG suppository Place 1 suppository (650 mg total) rectally every 6 (six) hours as needed for mild pain. Qty: 12 suppository, Refills: 0    aspirin 325 MG tablet Take 1 tablet (325 mg total) by mouth daily. Qty: 30 tablet, Refills: 0    atorvastatin (LIPITOR) 10 MG tablet Take 1 tablet (10 mg total) by mouth daily at 6 PM. Qty: 30 tablet, Refills: 0    insulin aspart (NOVOLOG) 100 UNIT/ML injection Inject 0-15 Units into the skin every 4 (four) hours. Qty: 10 mL, Refills: 11    insulin glargine (LANTUS) 100 UNIT/ML injection Inject 0.2 mLs (20 Units total) into  the skin at bedtime. Qty: 10 mL, Refills: 11    Nutritional Supplements (FEEDING SUPPLEMENT, GLUCERNA 1.2 CAL,) LIQD Place 1,000 mLs into feeding tube continuous. Qty: 1500 mL, Refills: 1    traMADol (ULTRAM) 50 MG tablet Take 1 tablet (50 mg total) by mouth every 6 (six) hours as needed for moderate pain. Qty: 30 tablet, Refills: 0      CONTINUE these medications which have CHANGED   Details  lisinopril (PRINIVIL,ZESTRIL) 20 MG tablet Take 1 tablet (20 mg total) by mouth daily. Qty: 30 tablet, Refills: 1      STOP taking these medications     glipiZIDE (GLUCOTROL XL) 10 MG 24 hr tablet      hydrochlorothiazide (HYDRODIURIL) 25 MG tablet      metFORMIN (GLUCOPHAGE) 1000 MG tablet      metFORMIN (GLUCOPHAGE) 1000 MG tablet        Allergies  Allergen Reactions  . Apple Pectin [Pectin] Itching    ITCHY THROAT  . Peach [Prunus Persica] Itching    ITCHY THROAT   Follow-up Information    Follow up with Xu,Jindong, MD. Schedule an appointment as soon as possible for a visit in 2 months.   Specialty:  Neurology   Why:  stroke clinic   Contact information:   [place] [id number] [phone]       Follow up with Xu,Jindong, MD In 2 months.   Specialty:  Neurology   Why:  stroke clinic, office will call you for follow up appointment, call earlier if symptoms worsen or new neurologica   Contact information:   [address] [place] [address] [phone]        The results of significant diagnostics from this hospitalization (including imaging, microbiology, ancillary and laboratory) are listed below for reference.    Significant Diagnostic Studies: Dg Abd 1 View  02/10/2015   CLINICAL DATA:  Feeding tube placement.  Initial encounter.  EXAM: ABDOMEN - 1 VIEW  COMPARISON:  None.  FINDINGS: 1632 hours. Feeding tube tip projects over the left upper quadrant of the abdomen consistent with position in the mid stomach. The  visualized bowel gas pattern is normal. There is some contrast material within the right colon. No evidence of free air.  IMPRESSION: Feeding tube tip in the mid stomach.   Electronically Signed   By: Richardean Sale M.D.   On: 02/10/2015 16:42   Ct Head Wo Contrast  02/12/2015   CLINICAL DATA:  Increasing right arm  weakness  EXAM: CT HEAD WITHOUT CONTRAST  TECHNIQUE: Contiguous axial images were obtained from the base of the skull through the vertex without intravenous contrast.  COMPARISON:  02/09/2015  FINDINGS: The bony calvarium is intact. No findings to suggest acute hemorrhage, acute infarction or space-occupying mass lesion are noted. The known infarct in the brainstem is not well appreciated on this exam.  IMPRESSION: No acute abnormality is noted. The known brainstem infarct is not well appreciated on this study.   Electronically Signed   By: Inez Catalina M.D.   On: 02/12/2015 08:36   Ct Head (brain) Wo Contrast  02/09/2015   CLINICAL DATA:  Right hand numbness which started this morning. History of blindness. Colonoscopy same day.  EXAM: CT HEAD WITHOUT CONTRAST  TECHNIQUE: Contiguous axial images were obtained from the base of the skull through the vertex without intravenous contrast.  COMPARISON:  None.  FINDINGS: No acute intracranial hemorrhage. No focal mass lesion. No CT evidence of acute infarction. No midline shift or mass effect. No hydrocephalus. Basilar cisterns are patent.  There is mild cortical atrophy. There is mild periventricular white matter hypodensities.  Postsurgical change in the right globe. Paranasal sinuses and mastoid air cells are clear.  There is mucosal thickening in the maxillary sinuses, greater on the left. Frontal sinuses are clear.  IMPRESSION: 1. No acute intracranial findings. 2. Mild atrophy and white matter microvascular disease. 3. Maxillary sinus inflammation.   Electronically Signed   By: Suzy Bouchard M.D.   On: 02/09/2015 15:20   Ct Cervical Spine Wo  Contrast  02/09/2015   CLINICAL DATA:  intermittent RIGHT hand numbness that began yesterday. Recent endoscopy.  EXAM: CT CERVICAL SPINE WITHOUT CONTRAST  TECHNIQUE: Multidetector CT imaging of the cervical spine was performed without intravenous contrast. Multiplanar CT image reconstructions were also generated.  COMPARISON:  CT head earlier today.  FINDINGS: There is no visible cervical spine fracture, traumatic subluxation, prevertebral soft tissue swelling, or intraspinal hematoma. Mild disc space narrowing at C5-6 and C6-7. Multilevel uncinate spurring most pronounced at C6-7, LEFT. No worrisome osseous lesions. Cervicothoracic junction unremarkable. Moderate anterior spurring. Odontoid is intact. No neck masses. Carotid atherosclerosis. Lung apices clear.  IMPRESSION: Cervical spondylosis as described; no acute features.   Electronically Signed   By: Rolla Flatten M.D.   On: 02/09/2015 17:44   Mr Jodene Nam Head Wo Contrast  02/09/2015   CLINICAL DATA:  Brainstem infarction. Difficulty swallowing. Right-sided numbness.  EXAM: MRA HEAD WITHOUT CONTRAST  TECHNIQUE: Angiographic images of the Circle of Willis were obtained using MRA technique without intravenous contrast.  COMPARISON:  MRI brain reported separately.  FINDINGS: The internal carotid arteries are widely patent. The basilar artery is slightly irregular but widely patent. LEFT vertebral is the dominant posterior circulation vessel, but both vertebrals contribute to formation of the basilar.  There is no proximal stenosis of the anterior or middle cerebral arteries. Minor irregularity ambient P2 segment both posterior cerebral arteries up to 50%. No distal vertebral irregularity.  Both PICA vessels are patent. The LEFT AICA vessel is not visualized. The RIGHT AICA vessel is severely diseased proximally. Both superior cerebellar arteries are unremarkable.  IMPRESSION: Intracranial atherosclerotic change as described. No large vessel flow-limiting stenosis or  occlusion.  The observed pattern of LEFT paramedian lower pontine and upper medullary infarction is most consistent with small vessel disease or brainstem penetrating perforators.   Electronically Signed   By: Rolla Flatten M.D.   On: 02/09/2015 21:50   Mr  Brain Wo Contrast  02/12/2015   CLINICAL DATA:  Worsening right-sided weakness beginning 12 hours ago.  EXAM: MRI HEAD WITHOUT CONTRAST  TECHNIQUE: Multiplanar, multiecho pulse sequences of the brain and surrounding structures were obtained without intravenous contrast.  COMPARISON:  Head CT 02/12/2015.  MRI 02/09/2015.  FINDINGS: Today's study does not suffer from as much motion degradation as the previous exam. Again demonstrated is infarction of the left para median min tele superiorly at the junction with the pons. The area appears slightly larger, but this may relate to less motion and evolution of the infarction. No new area of infarction is seen. T2 signal abnormality is now present. No evidence of hemorrhage.  Elsewhere, mild chronic small-vessel disease of the cerebral hemispheric white matter is unchanged. No hydrocephalus. 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  IMPRESSION: No new area of infarction. The acute infarction in the upper left para median medulla has undergone expected evolutionary change, now showing abnormal T2 signal. The area of restricted diffusion is slightly larger than on the previous study, but there is less motion degradation and slight swelling of the infarction could also contribute to this. I cannot completely exclude mild marginal extension, but think of the changes are evolutionary more likely.  These results will be called to the ordering clinician or representative by the Radiologist Assistant, and communication documented in the PACS or zVision Dashboard.   Electronically Signed   By: Nelson Chimes M.D.   On: 02/12/2015 16:37   Mr Brain Wo Contrast  02/09/2015    CLINICAL DATA:  Intermittent RIGHT hand numbness. This also involves the RIGHT shoulder and RIGHT leg. Difficulty swallowing.  EXAM: MRI HEAD WITHOUT CONTRAST  TECHNIQUE: Multiplanar, multiecho pulse sequences of the brain and surrounding structures were obtained without intravenous contrast.  COMPARISON:  CT head and cervical spine earlier today.  FINDINGS: There is an acute infarction involving the lower pons and upper medulla on the LEFT. This extends over an AP dimension of approximately 1 cm, rostral caudal extent of approximately 1 cm, and is approximately 5 mm thick.  The infarct is nonhemorrhagic. Early cytotoxic edema can be seen on T2 and FLAIR imaging. No other similar areas of acute infarction.  Generalized atrophy. Mild subcortical and periventricular T2 and FLAIR hyperintensities, likely chronic microvascular ischemic change. Flow voids are maintained throughout the carotid, basilar, and vertebral arteries. There are no areas of chronic hemorrhage. Pituitary, pineal, and cerebellar tonsils unremarkable. No upper cervical lesions. Visualized calvarium, skull base, and upper cervical osseous structures unremarkable. Scalp and extracranial soft tissues, sinuses, and mastoids show no acute process. Chronic deformity of the RIGHT globe, small and shrunken with chronic vitreous hemorrhage and calcification.  IMPRESSION: Acute LEFT paramedian 5 x 10 x 10 mm. lower brainstem infarction. This is nonhemorrhagic.  Chronic changes as described.   Electronically Signed   By: Rolla Flatten M.D.   On: 02/09/2015 21:46   Mr Cervical Spine Wo Contrast  02/09/2015   CLINICAL DATA:  Intermittent RIGHT hand numbness beginning yesterday. Mild RIGHT leg numbness beginning today, difficulty swallowing. History of diabetes, hypertension.  EXAM: MRI CERVICAL SPINE WITHOUT CONTRAST  TECHNIQUE: Multiplanar, multisequence MR imaging of the cervical spine was performed. No intravenous contrast was administered.  COMPARISON:  CT  of the cervical spine February 09, 2015 at 1740 hours.  FINDINGS: Cervical vertebral bodies and posterior elements are intact and aligned, straightened cervical lordosis. Moderate C4-5 through C6-7 disc height loss, with  decreased T2 signal within all cervical disc consistent with mild desiccation. Moderate to severe acute on chronic discogenic endplate changes at D34-534 and C5-6. Moderate to severe chronic discogenic endplate changes D34-534. No STIR signal abnormality to suggest acute osseous process.  Cervical spinal cord appears normal morphology and signal characteristics from the cervical medullary junction to level of T3-4, the most caudal well visualized level. Craniocervical junction is intact. Asymmetry of the scalene muscles, the middle and posterior slips on the RIGHT may be developmentally fused versus atrophic posterior slip.  Level by level evaluation:  C2-3: Uncovertebral hypertrophy in moderate facet arthropathy without canal stenosis or neural foraminal narrowing.  C3-4: Uncovertebral hypertrophy. Minimal facet arthropathy without canal stenosis. Mild RIGHT neural foraminal narrowing.  C4-5: 2 mm broad-based disc bulge, uncovertebral hypertrophy and minimal facet arthropathy. Mild to moderate canal stenosis. Moderate RIGHT, moderate to severe LEFT neural foraminal narrowing.  C5-6: 2 mm broad-based disc bulge, uncovertebral hypertrophy and minimal facet arthropathy. Mild canal stenosis. Moderate RIGHT, moderate to severe LEFT neural foraminal narrowing.  C6-7: Small broad-based disc bulge with tiny broad-based LEFT central disc protrusion. Uncovertebral hypertrophy and minimal facet arthropathy. Mild canal stenosis. Mild RIGHT, moderate LEFT neural foraminal narrowing.  C7-T1: No significant disc bulge, canal stenosis or neural foraminal narrowing.  IMPRESSION: Straightened cervical lordosis without acute fracture nor malalignment.  Degenerative change of the cervical spine resulting in mild to moderate  canal stenosis at C4-5, mild at C5-6 and C6-7.  Neural foraminal narrowing C3-4 thru C6-7: Moderate to severe on the LEFT at C4-5 and C6-7.  Asymmetric scalene muscles: Fused RIGHT middle and posterior slips versus atrophic posterior sludge.   Electronically Signed   By: Elon Alas M.D.   On: 02/09/2015 22:01   Dg Swallowing Func-speech Pathology  02/10/2015    Objective Swallowing Evaluation:    Patient Details  Name: Russell Austin MRN: [id number] Date of Birth: 03/08/1951  Today's Date: 02/10/2015 Time: SLP Start Time (ACUTE ONLY): 0825-SLP Stop Time (ACUTE ONLY): 0850 SLP Time Calculation (min) (ACUTE ONLY): 25 min  Past Medical History:  Past Medical History  Diagnosis Date  . Diabetes mellitus type II   . Hypertension   . ED (erectile dysfunction)   . Diabetic retinopathy FOLLOWED BY DR Zadie Rhine  . 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 Glaucoma of both eyes   . Left hydrocele    Past Surgical History:  Past Surgical History  Procedure Laterality Date  . Undescended right testicle removed  1994  . Right eye vitrectomy/ insertion glaucoma seton/ laser repair  12-13-2008     RETINAL ARTERY OCCLUSION /NEOVASCULAR GLAUCOMA/ HEMORRHAGE  . Right eye vitretomy/ insertion glaucoma seton x2/ laser  03-24-2009    RECURRENT HEMORRHAGE/ OCCLUSION INTERNAL SETON  . Left eye laser retina repair  SEPT 2012  . Appendectomy  AGE EARLY 20'S  . Hydrocele excision  03/31/2012    Procedure: HYDROCELECTOMY ADULT;  Surgeon: Franchot Gallo, MD;   Location: [facility];  Service: Urology;  Laterality:  Left;  57 MINS     HPI:  Other Pertinent Information: 64 yo male with h/o HTN, DM2, neuropathy,  CKD, blind- admitted after colonscopy with dysphagia and right hand/arm  numbness.  Pt failed an RNSSS.  Per MRI pt with left brainstem  nonhemorrhagic cva impacting medulla/pons.  Clinical decision made to  forgo BSE and proceed with MBS due to brainstem cva.    No Data Recorded  Assessment /  Plan / Recommendation  CHL IP CLINICAL IMPRESSIONS 02/10/2015  Therapy Diagnosis Severe pharyngeal phase dysphagia;Severe cervical  esophageal phase dysphagia  Clinical Impression Pt presents with gross sensorimotor pharyngo=cervical  esophageal dysphagia due to medullary/pons cva.  Nearly absent pharyngel  contraction, laryngeal elevation noted resulting in only 1/8 of tsp thin  transiting into esophagus.  Remainder was retained in pharynx/presumed to  be aspirated- pt moved out of flouro with overt coughing.  Delayed swallow  response to pyriform sinus noted.  At one point, pt stated "I swallowed"  when muscular contraction was not observed.  Only 2 tsps of liquid  (nectar/thin) provided and testing ceased due to level of dysphagia.    Using live video, educated pt/spouse to findings/recommendations.  As pt  demonstrates adequate oral skills, recommend he swish and expectorate with  water prn for comfort/oral hygeine.  Reviewed importance of oral hygiene  with pt and spouse.   Pt will benefit from consideration for alternative means of nutrition - ?  longer term.  Wife and pt educated using teach back for reinforcement.         No flowsheet data found.   CHL IP DIET RECOMMENDATION 02/10/2015  SLP Diet Recommendations NPO=-swish and expectorate with water prn  Liquid Administration via (None)  Medication Administration Via alternative means  Compensations (None)  Postural Changes and/or Swallow Maneuvers (None)     CHL IP OTHER RECOMMENDATIONS 02/10/2015  Recommended Consults (None)  Oral Care Recommendations Oral care QID  Other Recommendations Have oral suction available     No flowsheet data found.   CHL IP FREQUENCY AND DURATION 02/10/2015  Speech Therapy Frequency (ACUTE ONLY) min 2x/week  Treatment Duration 2 weeks      CHL IP REASON FOR REFERRAL 02/10/2015  Reason for Referral Objectively evaluate swallowing function     CHL IP ORAL PHASE 02/10/2015  Oral Phase WFL      CHL IP PHARYNGEAL PHASE 02/10/2015  Pharyngeal Phase  Impaired  Pharyngeal Comment pt with overt coughing and moving out of flouro screen  - suspected overt aspiration due to nearly absent muscular contraction      CHL IP CERVICAL ESOPHAGEAL PHASE 02/10/2015  Cervical Esophageal Phase Impaired                 Nectar Teaspoon Reduced cricopharyngeal relaxation           Thin Teaspoon Reduced cricopharyngeal relaxation           Cervical Esophageal Comment nearly absent cp relaxation            Luanna Salk, MS [facility] [place] [facility] SLP [phone]     Microbiology: No results found for this or any previous visit (from the past 240 hour(s)).   Labs: Basic Metabolic Panel:  Recent Labs Lab 02/09/15 1345 02/09/15 1351 02/10/15 0734 02/11/15 0410  NA 136 138 139 142  K 4.1 4.1 3.6 3.6  CL 103 103 107 110  CO2 22  --  24 24  GLUCOSE 262* 261* 110* 163*  BUN 20 23* 18 22*  CREATININE 1.42* 1.30* 1.28* 1.31*  CALCIUM 8.9  --  8.6* 8.8*   Liver Function Tests:  Recent Labs Lab 02/09/15 1345 02/10/15 0734  AST 26 22  ALT 22 17  ALKPHOS 81 72  BILITOT 1.8* 1.7*  PROT 6.7 6.3*  ALBUMIN 3.8 3.4*   No results for input(s): LIPASE, AMYLASE in the last 168 hours. No results for input(s): AMMONIA in the last 168 hours. CBC:  Recent Labs Lab 02/09/15 1345 02/09/15  1351 02/10/15 0734 02/11/15 0410  WBC 4.7  --  5.3 5.4  NEUTROABS 3.1  --  3.6  --   HGB 13.0 13.9 12.0* 11.9*  HCT 37.0* 41.0 35.4* 34.8*  MCV 85.5  --  86.1 87.4  PLT 279  --  281 253   Cardiac Enzymes: No results for input(s): CKTOTAL, CKMB, CKMBINDEX, TROPONINI in the last 168 hours. BNP: BNP (last 3 results) No results for input(s): BNP in the last 8760 hours.  ProBNP (last 3 results) No results for input(s): PROBNP in the last 8760 hours.  CBG:  Recent Labs Lab 02/13/15 2044 02/14/15 0014 02/14/15 0416 02/14/15 0821 02/14/15 1219  GLUCAP 137* 134* 138* 146* 191*       Signed:  Pedram Goodchild  [facility] 02/14/2015, 3:18 PM

## 2015-02-14 NOTE — H&P (Signed)
Physical Medicine and Rehabilitation Admission H&P    Chief Complaint  Patient presents with  . Right sided weakness with ataxia and severe dysphagia.     HPI:  Russell Austin is a 64 y.o. male with history of DM type 2 with retinopathy, HTN, glaucoma, who was admitted on 02/09/15 with reports of heaviness of RUE/RLE with intermittent numbness, pins and needles since waking that am. He underwent colonoscopy that am and after procedure was noted to have difficulty walking as well as difficulty swallowing while in ED. 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 2D echo showed EF 60% with no wall abnormality. Carotid dopplers were negative for significant ICA stenosis. MBS done revealing severe oro-pharyngeal dysphagia. Therefore NPO was recommended and NGT was placed for nutritional support. Dr Erlinda Hong evaluated patient and recommended ASA for left medullary infarct most likely due to small vessel disease.   On 06/11 am, patient had increase in right sided weakness and MRI of brain was done revealing evolutionary change with slight increase in left paramedian infarct. Neurology felt that this was natural progression and no new recommendations noted. He is showing improvement in right sided weakness with antigravity strength today. Therapy ongoing and CIR was recommended by MD and Rehab team.     Review of Systems  Constitutional: Negative for fever.  HENT: Negative for hearing loss and tinnitus.   Eyes:       Blind in right eye.   Respiratory: Positive for cough and sputum production. Negative for shortness of breath.   Cardiovascular: Negative for chest pain and palpitations.  Gastrointestinal: Positive for constipation. Negative for heartburn, nausea and vomiting.  Genitourinary:  Negative for dysuria, urgency and frequency.  Musculoskeletal: Negative for myalgias and back pain.  Skin: Negative for rash.  Neurological: Positive for sensory change, speech change and focal weakness. Negative for headaches.  Psychiatric/Behavioral: Positive for depression. The patient has insomnia.       Past Medical History  Diagnosis Date  . Diabetes mellitus type II   . Hypertension   . ED (erectile dysfunction)   . Diabetic retinopathy FOLLOWED BY DR Zadie Rhine  . 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 Glaucoma of both eyes   . Left hydrocele     Past Surgical History  Procedure Laterality Date  . Undescended right testicle removed  1994  . Right eye vitrectomy/ insertion glaucoma seton/ laser repair  12-13-2008    RETINAL ARTERY OCCLUSION /NEOVASCULAR GLAUCOMA/ HEMORRHAGE  . Right eye vitretomy/ insertion glaucoma seton x2/ laser  03-24-2009    RECURRENT HEMORRHAGE/ OCCLUSION INTERNAL SETON  . Left eye laser retina repair  SEPT 2012  . Appendectomy  AGE EARLY 20'S  . Hydrocele excision  03/31/2012    Procedure: HYDROCELECTOMY ADULT;  Surgeon: Franchot Gallo, MD;  Location: [facility];  Service: Urology;  Laterality: Left;  32 MINS      Family History  Problem Relation Age of Onset  . Glaucoma Mother     Social History:  Married. Disabled 4 years ago due to loss of vision--used to drive a tractor trailer. Wife works at [employer] and active PTA. He reports that he has never smoked. He has never used smokeless tobacco. He reports that he does not drink alcohol or use illicit drugs.  Allergies  Allergen Reactions  . Apple Pectin [Pectin] Itching    ITCHY THROAT  . Peach [Prunus Persica] Itching    ITCHY THROAT    Medications Prior to Admission  Medication Sig Dispense Refill  . glipiZIDE (GLUCOTROL XL) 10 MG 24 hr tablet Take 1 tablet (10 mg total) by mouth daily. 90 tablet 3  . hydrochlorothiazide (HYDRODIURIL) 25  MG tablet TAKE ONE TABLET BY MOUTH EVERY DAY 90 tablet 3  . lisinopril (PRINIVIL,ZESTRIL) 40 MG tablet Take 1 tablet (40 mg total) by mouth daily. 90 tablet 3  . metFORMIN (GLUCOPHAGE) 1000 MG tablet Take 500 mg by mouth 2 (two) times daily with a meal.    . metFORMIN (GLUCOPHAGE) 1000 MG tablet 1 by mouth twice a day (Patient not taking: Reported on 02/09/2015) 180 tablet 3    Home: Home Living Family/patient expects to be discharged to:: Private residence Living Arrangements: Spouse/significant other Available Help at Discharge: Family, Available 24 hours/day (could be arranged) Type of Home: House Home Access: Stairs to enter [employer] of Steps: 1-3 Entrance Stairs-Rails: Left Home Layout: Two level, Bed/bath upstairs, 1/2 bath on main level Alternate Level Stairs-Number of Steps: flight Home Equipment: None  Lives With: Spouse   Functional History: Prior Function Level of Independence: Independent 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 Transfers Overall transfer level: Needs assistance Equipment used: 1 person hand held assist Transfers: Sit to/from Stand, Stand Pivot Transfers Sit to Stand: Min assist Stand pivot transfers: Mod assist General transfer comment: Assist to maintain midline orientation. Ataxic Ambulation/Gait Ambulation/Gait assistance: Mod assist, +2 safety/equipment Ambulation Distance (Feet): 60 Feet Assistive device: Rolling walker (2 wheeled), 1 person hand held assist General Gait Details: Pt ataxic with rt knee hyperextension.  Gait Pattern/deviations: Step-through pattern, Ataxic, Leaning posteriorly Gait velocity: decr Gait velocity interpretation: Below normal speed for age/gender    ADL: ADL Overall ADL's : Needs  assistance/impaired Eating/Feeding: NPO Grooming: Minimal assistance, Sitting Upper Body Bathing: Minimal assitance, Sitting Lower Body Bathing: Moderate assistance, Sit to/from stand Upper Body Dressing : Moderate assistance, Sitting Lower Body Dressing: Moderate assistance, Sit to/from stand Toilet Transfer: Moderate assistance, Stand-pivot Toileting- Clothing Manipulation and Hygiene: Moderate assistance Functional mobility during ADLs: Moderate assistance (not ambulation) General ADL Comments: Affected by ataxic RUE movements, inccordination and balance deficits  Cognition: Cognition Overall Cognitive Status: Impaired/Different from baseline Arousal/Alertness: Awake/alert Orientation Level: Oriented X4 Attention: Sustained Sustained Attention: Appears intact Memory: Impaired Memory Impairment: Retrieval deficit Awareness: Impaired Awareness Impairment: Emergent impairment (in regards to dysphagia) Problem Solving: Appears intact Safety/Judgment: Appears intact Cognition Arousal/Alertness: Awake/alert Behavior During Therapy: WFL for tasks assessed/performed Overall Cognitive Status: Impaired/Different from baseline   Blood pressure 141/67, pulse 67, temperature 98.2 F (36.8 C), temperature source Oral, resp. rate 16, height 5\' 9"  (1.753 m), weight 70.7 kg (155 lb 13.8 oz), SpO2 100 %. Physical Exam  Nursing note and vitals reviewed. Constitutional: He is oriented to person, place, and time. He appears well-developed and well-nourished.  HENT:  Head: Normocephalic and atraumatic.  Eyes: Right eye exhibits abnormal extraocular motion. Right pupil is not reactive.  Neck: Normal range of motion. Neck supple.  Cardiovascular: Normal rate and regular rhythm.   No murmur heard. Respiratory: Effort normal. No respiratory distress. He has no wheezes.  GI: Soft. Bowel sounds are normal. He exhibits no  distension. There is no tenderness.  Musculoskeletal: He exhibits no edema or  tenderness.  Neurological: He is alert and oriented to person, place, and time.  Voice is stronger today but still has problems with oral secretions. Able to follow one and two step commands without difficulty.  Decrease in motor control on the right. Right hemiparesis RUE1+ to 2- deltoid, bicep, tricep and trace at wrist, hand. RLE: 3/5 hf, 4/5 ke, had, 4/5 ankle dorsi and plantarflexion. Sensation appears grossly intake right arm and leg. Speech dysarthric. Volume good, does have limitations in cough. Right eye blind.   Skin: Skin is warm and dry.  Psychiatric: He has a normal mood and affect. His behavior is normal. Judgment and thought content normal.    Results for orders placed or performed during the hospital encounter of 02/09/15 (from the past 48 hour(s))  Glucose, capillary     Status: Abnormal   Collection Time: 02/12/15  4:35 PM  Result Value Ref Range   Glucose-Capillary 111 (H) 65 - 99 mg/dL  Glucose, capillary     Status: Abnormal   Collection Time: 02/12/15  8:07 PM  Result Value Ref Range   Glucose-Capillary 148 (H) 65 - 99 mg/dL  Glucose, capillary     Status: Abnormal   Collection Time: 02/13/15 12:03 AM  Result Value Ref Range   Glucose-Capillary 152 (H) 65 - 99 mg/dL  Glucose, capillary     Status: Abnormal   Collection Time: 02/13/15  4:06 AM  Result Value Ref Range   Glucose-Capillary 115 (H) 65 - 99 mg/dL  Glucose, capillary     Status: Abnormal   Collection Time: 02/13/15  8:31 AM  Result Value Ref Range   Glucose-Capillary 180 (H) 65 - 99 mg/dL  Glucose, capillary     Status: Abnormal   Collection Time: 02/13/15 12:09 PM  Result Value Ref Range   Glucose-Capillary 166 (H) 65 - 99 mg/dL  Glucose, capillary     Status: Abnormal   Collection Time: 02/13/15  4:30 PM  Result Value Ref Range   Glucose-Capillary 162 (H) 65 - 99 mg/dL  Glucose, capillary     Status: Abnormal   Collection Time: 02/13/15  8:44 PM  Result Value Ref Range   Glucose-Capillary 137  (H) 65 - 99 mg/dL  Glucose, capillary     Status: Abnormal   Collection Time: 02/14/15 12:14 AM  Result Value Ref Range   Glucose-Capillary 134 (H) 65 - 99 mg/dL  Glucose, capillary     Status: Abnormal   Collection Time: 02/14/15  4:16 AM  Result Value Ref Range   Glucose-Capillary 138 (H) 65 - 99 mg/dL  Glucose, capillary     Status: Abnormal   Collection Time: 02/14/15  8:21 AM  Result Value Ref Range   Glucose-Capillary 146 (H) 65 - 99 mg/dL  Glucose, capillary     Status: Abnormal   Collection Time: 02/14/15 12:19 PM  Result Value Ref Range   Glucose-Capillary 191 (H) 65 - 99 mg/dL   Mr Brain Wo Contrast  02/12/2015   CLINICAL DATA:  Worsening right-sided weakness beginning 12 hours ago.  EXAM: MRI HEAD WITHOUT CONTRAST  TECHNIQUE: Multiplanar, multiecho pulse sequences of the brain and surrounding structures were obtained without intravenous contrast.  COMPARISON:  Head CT 02/12/2015.  MRI 02/09/2015.  FINDINGS: Today's study does not suffer from as much motion degradation as the previous exam. Again demonstrated is infarction of the left para median min tele superiorly at the junction with the  pons. The area appears slightly larger, but this may relate to less motion and evolution of the infarction. No new area of infarction is seen. T2 signal abnormality is now present. No evidence of hemorrhage.  Elsewhere, mild chronic small-vessel disease of the cerebral hemispheric white matter is unchanged. No hydrocephalus. 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  IMPRESSION: No new area of infarction. The acute infarction in the upper left para median medulla has undergone expected evolutionary change, now showing abnormal T2 signal. The area of restricted diffusion is slightly larger than on the previous study, but there is less motion degradation and slight swelling of the infarction could also contribute to this. I cannot completely exclude  mild marginal extension, but think of the changes are evolutionary more likely.  These results will be called to the ordering clinician or representative by the Radiologist Assistant, and communication documented in the PACS or zVision Dashboard.   Electronically Signed   By: Nelson Chimes M.D.   On: 02/12/2015 16:37       Medical Problem List and Plan: 1. 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  DVT Prophylaxis/Anticoagulation: Pharmaceutical: Lovenox 3. Pain Management: N/A 4. Mood: LCSW to follow for evaluation and support. Has good family support.  5. Neuropsych: This patient is capable of making decisions on his own behalf. 6. Skin/Wound Care: Routine pressure relief measures.  7. Fluids/Electrolytes/Nutrition: Continue tube feeds for nutritional support. Add water flushes. Check lytes in am.  8. HTN: Monitor BP every 8 hours. Continue Prinivil daily.  9. Severe dysphagia: NPO. Will have dietician assist with nutritional needs as complaining of hunger--had colonoscopy last Wed.      10. DM type 2: Hgb A1C-7.6. Was on metformin 1000 mg bid with Glucotrol XL 10 mg daily. Now on Lantus due to continuous tube feeds.  11. CKD: Baseline Cr- 1.3.  Check lytes in am. Continue to hold HCTZ for now. 12. Dyslipidemia: continue Lipitor.    Post Admission Physician Evaluation: 1. 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 Patient is admitted to receive collaborative, interdisciplinary care between the physiatrist, rehab nursing staff, and therapy team. 3. Patient's level of medical complexity and substantial therapy needs in context of that medical necessity cannot be provided at a lesser intensity of care such as a SNF. 4. Patient has experienced substantial functional loss from his/her baseline which was documented above under the "Functional History" and "Functional Status"  headings.  Judging by the patient's diagnosis, physical exam, and functional history, the patient has potential for functional progress which will result in measurable gains while on inpatient rehab.  These gains will be of substantial and practical use upon discharge  in facilitating mobility and self-care at the household level. 5. Physiatrist will provide 24 hour management of medical needs as well as oversight of the therapy plan/treatment and provide guidance as appropriate regarding the interaction of the two. 6. 24 hour rehab nursing will assist with bladder management, bowel management, safety, skin/wound care, disease management, medication administration, pain management and patient education  and help integrate therapy concepts, techniques,education, etc. 7. PT will assess and treat for/with: Lower extremity strength, range of motion, stamina, balance, functional mobility, safety, adaptive techniques and equipment, NMR, visual-perceptual awareness, ego support, community reintegration.   Goals are: mod I to supervision. 8. OT will assess and treat for/with: ADL's, functional mobility, safety, upper extremity strength, adaptive  techniques and equipment, NMR, cognitive perceptual awareness, stroke education.   Goals are: mod I to supervision. Therapy may proceed with showering this patient. 9. SLP will assess and treat for/with:  communication, and swallowing. .  Goals are: mod I to supervision (if swallowing doesn't advance). 10. Case Management and Social Worker will assess and treat for psychological issues and discharge planning. 11. Team conference will be held weekly to assess progress toward goals and to determine barriers to discharge. 12. Patient will receive at least 3 hours of therapy per day at least 5 days per week. 13. ELOS: 15-18 days       14. Prognosis:  excellent     Meredith Staggers, MD, [facility] 02/14/2015   02/14/2015

## 2015-02-14 NOTE — Care Management Note (Signed)
Case Management Note  Patient Details  Name: DANSON VASICEK MRN: [id number] Date of Birth: [birth year]  Subjective/Objective:   DC to CIR today, received BCBS authorization per CIR liaison.                 Action/Plan:   Expected Discharge Date:                  Expected Discharge Plan:  IP Rehab Facility  In-House Referral:  Clinical Social Work  Discharge planning Services  CM Consult  Post Acute Care Choice:    Choice offered to:  Patient  DME Arranged:    DME Agency:     HH Arranged:    [place] Agency:     Status of Service:  Completed, signed off  Medicare Important Message Given:  No Date Medicare IM Given:    Medicare IM give by:    Date Additional Medicare IM Given:    Additional Medicare Important Message give by:     If discussed at [place] of Stay Meetings, dates discussed:    Additional Comments:  [name], RN 02/14/2015, 5:28 PM

## 2015-02-14 NOTE — Progress Notes (Signed)
STROKE TEAM PROGRESS NOTE   SUBJECTIVE (INTERVAL HISTORY) He continuesto improve . His able to move his right side now against gravity. His wife is present at the bedside and agrees.  OBJECTIVE Temp:  [98.2 F (36.8 C)-98.5 F (36.9 C)] 98.2 F (36.8 C) (06/13 0650) Pulse Rate:  [61-64] 62 (06/13 1015) Cardiac Rhythm:  [-]  Resp:  [16] 16 (06/13 0650) BP: (131-152)/(63-87) 141/87 mmHg (06/13 1015) SpO2:  [99 %-100 %] 100 % (06/13 0650) Weight:  [155 lb 13.8 oz (70.7 kg)] 155 lb 13.8 oz (70.7 kg) (06/13 0321)   Recent Labs Lab 02/13/15 2044 02/14/15 0014 02/14/15 0416 02/14/15 0821 02/14/15 1219  GLUCAP 137* 134* 138* 146* 191*    Recent Labs Lab 02/09/15 1345 02/09/15 1351 02/10/15 0734 02/11/15 0410  NA 136 138 139 142  K 4.1 4.1 3.6 3.6  CL 103 103 107 110  CO2 22  --  24 24  GLUCOSE 262* 261* 110* 163*  BUN 20 23* 18 22*  CREATININE 1.42* 1.30* 1.28* 1.31*  CALCIUM 8.9  --  8.6* 8.8*    Recent Labs Lab 02/09/15 1345 02/10/15 0734  AST 26 22  ALT 22 17  ALKPHOS 81 72  BILITOT 1.8* 1.7*  PROT 6.7 6.3*  ALBUMIN 3.8 3.4*    Recent Labs Lab 02/09/15 1345 02/09/15 1351 02/10/15 0734 02/11/15 0410  WBC 4.7  --  5.3 5.4  NEUTROABS 3.1  --  3.6  --   HGB 13.0 13.9 12.0* 11.9*  HCT 37.0* 41.0 35.4* 34.8*  MCV 85.5  --  86.1 87.4  PLT 279  --  281 253   No results for input(s): CKTOTAL, CKMB, CKMBINDEX, TROPONINI in the last 168 hours. No results for input(s): LABPROT, INR in the last 72 hours. No results for input(s): COLORURINE, LABSPEC, [place], GLUCOSEU, HGBUR, BILIRUBINUR, KETONESUR, PROTEINUR, UROBILINOGEN, NITRITE, LEUKOCYTESUR in the last 72 hours.  Invalid input(s): APPERANCEUR     Component Value Date/Time   CHOL 128 02/10/2015 0547   TRIG 93 02/10/2015 0547   HDL 33* 02/10/2015 0547   CHOLHDL 3.9 02/10/2015 0547   VLDL 19 02/10/2015 0547   LDLCALC 76 02/10/2015 0547   Lab Results  Component Value Date   HGBA1C 7.5* 02/10/2015    No results found for: LABOPIA, COCAINSCRNUR, LABBENZ, AMPHETMU, THCU, LABBARB  No results for input(s): ETH in the last 168 hours.  I have personally reviewed the radiological images below and agree with the radiology interpretations.  Dg Abd 1 View  02/10/2015   IMPRESSION: Feeding tube tip in the mid stomach.     Ct Head (brain) Wo Contrast  02/09/2015   IMPRESSION: 1. No acute intracranial findings. 2. Mild atrophy and white matter microvascular disease. 3. Maxillary sinus inflammation.     Stat Ct Head (brain) Wo Contrast 02/12/2015 No acute abnormality is noted. The known brainstem infarct is not well appreciated on this study.   Stat MRI brain without contrast 02/12/2015 Pending   Mr Jodene Nam Head Wo Contrast  02/09/2015   IMPRESSION: Intracranial atherosclerotic change as described. No large vessel flow-limiting stenosis or occlusion.  The observed pattern of LEFT paramedian lower pontine and upper medullary infarction is most consistent with small vessel disease or brainstem penetrating perforators.     Mr Brain Wo Contrast 02/09/2015     IMPRESSION: Acute LEFT paramedian 5 x 10 x 10 mm. lower brainstem infarction. This is nonhemorrhagic.  Chronic changes as described.     Mr Cervical Spine Wo Contrast  02/09/2015  IMPRESSION: Straightened cervical lordosis without acute fracture nor malalignment.  Degenerative change of the cervical spine resulting in mild to moderate canal stenosis at C4-5, mild at C5-6 and C6-7.  Neural foraminal narrowing C3-4 thru C6-7: Moderate to severe on the LEFT at C4-5 and C6-7.  Asymmetric scalene muscles: Fused RIGHT middle and posterior slips versus atrophic posterior sludge.     Carotid Doppler  Bilateral: 1-39% ICA stenosis. Vertebral artery flow is antegrade.   2D Echocardiogram  - Left ventricle: The cavity size was normal. Wall thickness was normal. The estimated ejection fraction was 60%. Wall motion was normal; there were no regional  wall motion abnormalities. - Right ventricle: The cavity size was normal. Systolic function was normal. - Impressions: No cardiac source of embolism was identified, but cannot be ruled out on the basis of this examination. Impressions: - No cardiac source of embolism was identified, but cannot be ruled out on the basis of this examination.  EKG  normal sinus rhythm, unchanged from previous tracings. For complete results please see formal report.  MRI Brain 02/12/15 : No new area of infarction. The acute infarction in the upper left para median medulla has undergone expected evolutionary change  PHYSICAL EXAM    Temp:  [98.2 F (36.8 C)-98.5 F (36.9 C)] 98.2 F (36.8 C) (06/13 0650) Pulse Rate:  [61-64] 62 (06/13 1015) Resp:  [16] 16 (06/13 0650) BP: (131-152)/(63-87) 141/87 mmHg (06/13 1015) SpO2:  [99 %-100 %] 100 % (06/13 0650) Weight:  [155 lb 13.8 oz (70.7 kg)] 155 lb 13.8 oz (70.7 kg) (06/13 0321)  General - Well nourished, well developed middle-aged African-American male, in no apparent distress.  Ophthalmologic - Sharp disc margins OU.  Cardiovascular - Regular rate and rhythm with no murmur.  Mental Status -  Level of arousal and orientation to time, place, and person were intact. Language including expression, naming, repetition, comprehension was assessed and found intact. Attention span and concentration were normal. Recent and remote memory were intact. Fund of Knowledge was assessed and was intact.  Cranial Nerves II - XII - II - Visual field intact OU. But right eye blindness chronic.with enophthalmos  III, IV, VI - Extraocular movements intact left eye with nystagmus on right gaze. Right eye corneal clouding. V - Facial sensation intact bilaterally. VII - mild right nasolabial fold flattening. VIII - Hearing & vestibular intact bilaterally. X - Palate elevates symmetrically. Good cough XI - Chin turning & shoulder shrug intact bilaterally. XII -  Tongue protrusion intact.  Motor Strength - The patient's strength was 3/5 RUE with hand weakness and weak grip. and 4/5 RLE and pronator drift was present on the right UE.  Bulk was normal and fasciculations were absent.   Motor Tone - Muscle tone was assessed at the neck and appendages and was normal.  Reflexes - The patient's reflexes were symmetrical in all extremities and he had no pathological reflexes.  Sensory - Light touch, temperature/pinprick were assessed and were diminished on the right side  Coordination - The patient had normal movements in the hands and feet with no ataxia or dysmetria.  Tremor was absent.  Gait and Station - not tested due to weakness.      ASSESSMENT/PLAN Mr. RONALD PEGUERO is a 64 y.o. male with history of HTN, DM, right eye blind admitted for right sided weakness x 3 days. Symptoms improving.    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  MRI  Left medullary infarct  MRA  No LVO  Carotid Doppler unremarkable  2D Echo  unremarkable  LDL 76  HgbA1c 7.6, not at goal  subq heaprin for VTE prophylaxis Diet NPO time specified  no antithrombotic prior to admission, now on aspirin 325 mg orally every day. Continue ASA on discharge. Patient counseled to be compliant with his antithrombotic medications Ongoing aggressive stroke risk factor management Therapy recommendations:  CIR Disposition:  pending   Dysphagia:  MBS showed not safe to swallow  PANDA tube placed  On tube feeding  Diabetes  HgbA1c 7.6 goal < 7.0  Uncontrolled  Currently on lantus  CBG monitoring  SSI  DM education  Hypertension  Home meds:  HCTZ Permissive hypertension (OK if <220/120) for 24-48 hours post stroke and then gradually normalized within 5-7 days. Currently on none  Stable  Hyperlipidemia  Home meds:  none   Currently on lipitor 10mg   LDL 76, goal < 70  Continue statin at discharge  Other  Stroke Risk Factor    Other Active Problems  - mild anemia sometimes.  PLAN  I have reviewed the MRI scan which was done 02/12/15 it shows slight extension of the patient's recent left medullary/pontine infarct.but no new area of infarction. Etiology of which is small vessel disease This is likely due to natural progression and unfortunately there is no specific treatment beyond conservative watch and risk factor modification.  mobilize as tolerated with physical therapy. Speech therapy consult for swallow eval for. Transferred to inpatient rehabilitation over the next couple of days. Discussed with patient and wife and answered questions. Stroke team will sign off. Currently call for questions. Follow-up with Dr. Erlinda Hong as an outpatient in 2 months  Antony Contras, [facility] day # 5     02/14/2015, 1:57 PM   To contact Stroke Continuity provider, please refer to [email or web address] After hours, contact General Neurology

## 2015-02-14 NOTE — Interval H&P Note (Signed)
REINIER LIENHART was admitted today to Inpatient Rehabilitation with the diagnosis of left ponto-medullary infarct.  The patient's history has been reviewed, patient examined, and there is no change in status.  Patient continues to be appropriate for intensive inpatient rehabilitation.  I have reviewed the patient's chart and labs.  Questions were answered to the patient's satisfaction. The PAPE has been reviewed and assessment remains appropriate.  SWARTZ,ZACHARY T 02/14/2015, 7:59 PM

## 2015-02-14 NOTE — Progress Notes (Addendum)
Nutrition Follow-up  DOCUMENTATION CODES:  Not applicable  INTERVENTION:  Tube feeding   Continue Glucerna 1.2 @ 50 ml/hr via NGT and increase by 10 ml every 4 hours to goal rate of 65 ml/hr.   Tube feeding regimen provides 1872 kcal (100% of needs), 94 grams of protein, and 1256 ml of H2O.   NUTRITION DIAGNOSIS:  Inadequate oral intake related to inability to eat as evidenced by NPO status.  Ongoing  GOAL:  Patient will meet greater than or equal to 90% of their needs  Progressing  MONITOR:  TF tolerance, Skin, Weight trends, Labs, I & O's  REASON FOR ASSESSMENT:  Consult Enteral/tube feeding initiation and management  ASSESSMENT: Russell Austin is a 64 y.o. male with Past medical history of diabetes mellitus type 2, hypertension, chronic kidney disease, diabetic retinopathy, glaucoma with right eye visual loss peripheral neuropathy. The patient is presenting with complaints of right-sided numbness.  Received consult for TF initiation and management. SLP continues to work with pt; remains NPO.  Spoke with RN who confirms that MD changed TF formula to Glucerna due to hypoglycemia. Per RN report, pt had CBGS running in the 300's prior to change in formula. Since change in formula RN reports CBGS have been running in the 140's. Pt has been tolerating TF well. Noted 0-5 ml residuals.   Glucerna 1.2 currently running via NGT @ 50 ml/hr. Current regimen meets 1440 kcals, 72 grams protein, 966 ml fluid daily, which meets 82% of estimated kcal needs and 90% of estimated fluid needs. Discussed with RN and Dr. Coralyn Pear plan to titrate feeding to goal rate to better meet pt's needs.   Spoke with RNCM who reports plan is to d/c to CIR; awaiting on insurance authoriziation.   Labs reviewed. CBGS: 115-162.  Height:  Ht Readings from Last 1 Encounters:  02/09/15 5\' 9"  (1.753 m)    Weight:  Wt Readings from Last 1 Encounters:  02/14/15 155 lb 13.8 oz (70.7 kg)    Ideal  Body Weight:  72.7 kg  Wt Readings from Last 10 Encounters:  02/14/15 155 lb 13.8 oz (70.7 kg)  12/20/14 182 lb (82.555 kg)  03/18/14 184 lb (83.462 kg)  12/24/13 184 lb (83.462 kg)  05/13/13 183 lb (83.008 kg)  10/30/12 182 lb (82.555 kg)  07/17/12 185 lb (83.915 kg)  03/26/12 185 lb (83.915 kg)  07/02/11 184 lb (83.462 kg)  06/01/10 188 lb (85.276 kg)    BMI:  Body mass index is 23.01 kg/(m^2). Normal weight range  Estimated Nutritional Needs:  Kcal:  1750-1950  Protein:  80-90 grams  Fluid:  1.8-2.0 L  Skin:  Reviewed, no issues  Diet Order:  Diet NPO time specified  EDUCATION NEEDS:  Education needs addressed   Intake/Output Summary (Last 24 hours) at 02/14/15 1041 Last data filed at 02/14/15 0502  Gross per 24 hour  Intake 1131.67 ml  Output    300 ml  Net 831.67 ml    Last BM:  02/09/15  Russell Austin A. Jimmye Norman, RD, LDN, CDE Pager: [phone] After hours Pager: [phone]

## 2015-02-14 NOTE — Progress Notes (Signed)
Pt admitted to rehab at [facility] with wife at bedside. Reviewed rehab booklet, schedule, and safety plan with verbal understanding. Pt in chair with wife in room

## 2015-02-14 NOTE — Progress Notes (Signed)
Rehab admissions - I received insurance authorization from [facility] for inpatient rehab and received medical clearance from Dr. Coralyn Pear. We will admit pt to inpatient rehab later today.  I called and updated pt and his wife by phone and will meet with them shortly to complete admission paperwork.  I called and updated Hassan Rowan, case Freight forwarder and she will update Marshell Levan, Education officer, museum. I also updated pt's RN.  Thanks.  Nanetta Batty, PT Rehabilitation Admissions Coordinator [phone];

## 2015-02-14 NOTE — Progress Notes (Signed)
[facility] discharged CIR [phone] per MD order.  Stroke education & risk factors reviewed and discussed with the patient & wife, will need re-inforcement & continue education.    Medication List    STOP taking these medications        glipiZIDE 10 MG 24 hr tablet  Commonly known as:  GLUCOTROL XL     hydrochlorothiazide 25 MG tablet  Commonly known as:  HYDRODIURIL     metFORMIN 1000 MG tablet  Commonly known as:  GLUCOPHAGE      TAKE these medications        acetaminophen 650 MG suppository  Commonly known as:  TYLENOL  Place 1 suppository (650 mg total) rectally every 6 (six) hours as needed for mild pain.     aspirin 325 MG tablet  Take 1 tablet (325 mg total) by mouth daily.     atorvastatin 10 MG tablet  Commonly known as:  LIPITOR  Take 1 tablet (10 mg total) by mouth daily at 6 PM.     feeding supplement (GLUCERNA 1.2 CAL) Liqd  Place 1,000 mLs into feeding tube continuous.     insulin aspart 100 UNIT/ML injection  Commonly known as:  novoLOG  Inject 0-15 Units into the skin every 4 (four) hours.     insulin glargine 100 UNIT/ML injection  Commonly known as:  LANTUS  Inject 0.2 mLs (20 Units total) into the skin at bedtime.     lisinopril 20 MG tablet  Commonly known as:  PRINIVIL,ZESTRIL  Take 1 tablet (20 mg total) by mouth daily.     traMADol 50 MG tablet  Commonly known as:  ULTRAM  Take 1 tablet (50 mg total) by mouth every 6 (six) hours as needed for moderate pain.        Patients skin is clean, dry and intact, no evidence of skin break down. IV remains intact at transfer.   Patient transferred to  4W via bed by NT in a wheelchair,  no distress noted upon transfer.  Wynetta Emery, Jameelah Watts C 02/14/2015 6:34 PM

## 2015-02-14 NOTE — Care Management Note (Signed)
Case Management Note  Patient Details  Name: Russell Austin MRN: [id number] Date of Birth: Sep 20, 1950  Subjective/Objective:     Patient for CIR tomorrow.                Action/Plan:   Expected Discharge Date:                  Expected Discharge Plan:  IP Rehab Facility  In-House Referral:  Clinical Social Work  Discharge planning Services  CM Consult  Post Acute Care Choice:    Choice offered to:  Patient  DME Arranged:    DME Agency:     HH Arranged:    [place] Agency:     Status of Service:  In process, will continue to follow  Medicare Important Message Given:  No Date Medicare IM Given:    Medicare IM give by:    Date Additional Medicare IM Given:    Additional Medicare Important Message give by:     If discussed at [place] of Stay Meetings, dates discussed:    Additional Comments:  [name], RN 02/14/2015, 2:55 PM

## 2015-02-14 NOTE — Progress Notes (Signed)
Occupational Therapy Treatment Patient Details Name: Russell Austin MRN: [id number] DOB: 1951-07-05 Today's Date: 02/14/2015    History of present illness Pt adm with lt lower brainstem infarct. PMH - HTN, DM, glaucoma   OT comments  Patient progressing towards OT goals, continue plan of care for now. Educated pt and wife on exercises > RUE, encouraged patient to perform AAROM on shoulder and elbow and wife to assist with forearm>fingers. Encouraged pt to use vision to compensate for poor sensation, pt had difficult time with this - attention? Vs vision? Pt very motivated to gain back his independence and will be a great CIR candidate.    Follow Up Recommendations  CIR;Supervision/Assistance - 24 hour    Equipment Recommendations  3 in 1 bedside comode;Other (comment) (TBD next venue of care)    Recommendations for Other Services Rehab consult    Precautions / Restrictions Precautions Precautions: Fall Precaution Comments: NPO Restrictions Weight Bearing Restrictions: No    Mobility Bed Mobility General bed mobility comments: Pt found seated in recliner upon OT entering/exiting room  Transfers - Per PT treatment note Overall transfer level: Needs assistance Equipment used: Rolling walker (2 wheeled);1 person hand held assist Transfers: Sit to/from [facility] Sit to Stand: Min assist Stand pivot transfers: Min assist;Mod assist       General transfer comment: control for hand placement and to control initial standing    Balance - Per PT treatment note Overall balance assessment: Needs assistance Sitting-balance support: Feet supported Sitting balance-Leahy Scale: Fair     Standing balance support: During functional activity Standing balance-Leahy Scale: Poor Standing balance comment: min assist to control final standing to sit    ADL Overall ADL's : Needs assistance/impaired General ADL Comments: Affected by ataxic RUE movements,  inccordination and balance deficits      Vision Additional Comments: Pt reports no change. However, during lap slides using RUE, pt unable to tell when needing to adjust fingers so they didn't get caught on pants and flex.       Praxis Praxis Praxis tested?: Within functional limits    Cognition   Behavior During Therapy: WFL for tasks assessed/performed Overall Cognitive Status: Within Functional Limits for tasks assessed     Exercises General Exercises - Upper Extremity Shoulder Flexion: AAROM;Right;10 reps;Seated Shoulder Extension: AAROM;Right;10 reps;Seated Elbow Flexion: AAROM;Right;10 reps;Seated Elbow Extension: AAROM;Right;10 reps;Seated Wrist Flexion: AAROM;Right;10 reps;Seated Wrist Extension: AAROM;Right;10 reps;Seated Digit Composite Flexion: AAROM;Right;10 reps;Seated Composite Extension: AAROM;Right;10 reps;Seated           Pertinent Vitals/ Pain       Pain Assessment: No/denies pain   Frequency Min 3X/week     Progress Toward Goals  OT Goals(current goals can now befound in the care plan section)  Progress towards OT goals: Progressing toward goals  Acute Rehab OT Goals Patient Stated Goal: get home soon  Plan Discharge plan remains appropriate    Activity Tolerance Patient tolerated treatment well  Patient Left in chair;with call bell/phone within reach;with family/visitor present    Time: [id number] OT Time Calculation (min): 17 min  Charges: OT General Charges $OT Visit: 1 Procedure OT Treatments $Neuromuscular Re-education: 8-22 mins  Arianne Klinge , MS, OTR/L, CLT Pager: [phone]  02/14/2015, 3:40 PM

## 2015-02-14 NOTE — Progress Notes (Signed)
Physical Therapy Treatment Patient Details Name: Russell Austin MRN: [id number] DOB: [birth year] Today's Date: 02/14/2015    History of Present Illness Pt adm with lt lower brainstem infarct. PMH - HTN, DM, glaucoma    PT Comments    Pt was able to control RLE with dense cues and PT using RW with control for R hand due to weak grip.  Pt is a candidate for platform walker or hemiwalker but lean toward platform attachment due to specific hand weakness.  Follow Up Recommendations        Equipment Recommendations       Recommendations for Other Services       Precautions / Restrictions Precautions Precautions: Fall Precaution Comments: NPO Restrictions Weight Bearing Restrictions: No    Mobility  Bed Mobility               General bed mobility comments: up when PT entered  Transfers Overall transfer level: Needs assistance Equipment used: Rolling walker (2 wheeled);1 person hand held assist Transfers: Sit to/from [facility] Sit to Stand: Min assist Stand pivot transfers: Min assist;Mod assist       General transfer comment: control for hand placement and to control initial standing  Ambulation/Gait Ambulation/Gait assistance: Min assist;Mod assist;+2 physical assistance;+2 safety/equipment (help for pole with ng tube) Ambulation Distance (Feet): 75 Feet Assistive device: Rolling walker (2 wheeled);1 person hand held assist Gait Pattern/deviations: Step-through pattern;Decreased step length - right;Decreased step length - left;Trunk flexed;Drifts right/left;Wide base of support Gait velocity: reduced Gait velocity interpretation: Below normal speed for age/gender General Gait Details: verbally cued heel strike and for knee control on RLE on walker   Stairs            Wheelchair Mobility    Modified Rankin (Stroke Patients Only) Modified Rankin (Stroke Patients Only) Pre-Morbid Rankin Score: No symptoms Modified Rankin:  Moderately severe disability     Balance Overall balance assessment: Needs assistance Sitting-balance support: Feet supported Sitting balance-Leahy Scale: Fair     Standing balance support: During functional activity Standing balance-Leahy Scale: Poor Standing balance comment: min assist to control final standing to sit                    Cognition Arousal/Alertness: Awake/alert Behavior During Therapy: WFL for tasks assessed/performed Overall Cognitive Status: Within Functional Limits for tasks assessed                      Exercises      General Comments        Pertinent Vitals/Pain Pain Assessment: No/denies pain    Home Living                      Prior Function            PT Goals (current goals can now be found in the care plan section) Acute Rehab PT Goals Patient Stated Goal: get home soon Progress towards PT goals: Progressing toward goals    Frequency       PT Plan      Co-evaluation             End of Session           Time: 1428-1500 PT Time Calculation (min) (ACUTE ONLY): 32 min  Charges:  $Gait Training: 23-37 mins                    G Codes:  Russell Austin 02/14/2015, 3:34 PM   Russell Austin, PT MS Acute Rehab Dept. Number: [phone] [phone] and [place] [phone]

## 2015-02-14 NOTE — Progress Notes (Signed)
Per Sherlyn Hay with CIR they are waiting for authorization from [facility] for CIR, patient has a panda tube, she can go to CIR with panda but not snf.

## 2015-02-15 ENCOUNTER — Inpatient Hospital Stay (HOSPITAL_COMMUNITY): Payer: BC Managed Care – PPO | Admitting: Speech Pathology

## 2015-02-15 ENCOUNTER — Inpatient Hospital Stay (HOSPITAL_COMMUNITY): Payer: BC Managed Care – PPO

## 2015-02-15 ENCOUNTER — Inpatient Hospital Stay (HOSPITAL_COMMUNITY): Payer: BC Managed Care – PPO | Admitting: Occupational Therapy

## 2015-02-15 DIAGNOSIS — I69391 Dysphagia following cerebral infarction: Secondary | ICD-10-CM

## 2015-02-15 LAB — GLUCOSE, CAPILLARY
GLUCOSE-CAPILLARY: 200 mg/dL — AB (ref 65–99)
Glucose-Capillary: 149 mg/dL — ABNORMAL HIGH (ref 65–99)
Glucose-Capillary: 154 mg/dL — ABNORMAL HIGH (ref 65–99)
Glucose-Capillary: 194 mg/dL — ABNORMAL HIGH (ref 65–99)
Glucose-Capillary: 196 mg/dL — ABNORMAL HIGH (ref 65–99)

## 2015-02-15 LAB — COMPREHENSIVE METABOLIC PANEL
ALT: 18 U/L (ref 17–63)
ANION GAP: 8 (ref 5–15)
AST: 22 U/L (ref 15–41)
Albumin: 3.3 g/dL — ABNORMAL LOW (ref 3.5–5.0)
Alkaline Phosphatase: 68 U/L (ref 38–126)
BUN: 35 mg/dL — ABNORMAL HIGH (ref 6–20)
CO2: 29 mmol/L (ref 22–32)
Calcium: 9.1 mg/dL (ref 8.9–10.3)
Chloride: 107 mmol/L (ref 101–111)
Creatinine, Ser: 1.25 mg/dL — ABNORMAL HIGH (ref 0.61–1.24)
GFR calc Af Amer: >60 mL/min (ref >60)
GFR, EST NON AFRICAN AMERICAN: 60 mL/min — AB (ref >60)
Glucose, Bld: 195 mg/dL — ABNORMAL HIGH (ref 65–99)
Potassium: 4.3 mmol/L (ref 3.5–5.1)
SODIUM: 144 mmol/L (ref 135–145)
Total Bilirubin: 1.4 mg/dL — ABNORMAL HIGH (ref 0.3–1.2)
Total Protein: 6.4 g/dL — ABNORMAL LOW (ref 6.5–8.1)

## 2015-02-15 LAB — CBC WITH DIFFERENTIAL/PLATELET
Basophils Absolute: 0 10*3/uL (ref 0.0–0.1)
Basophils Relative: 1 % (ref 0–1)
Eosinophils Absolute: 0.1 10*3/uL (ref 0.0–0.7)
Eosinophils Relative: 2 % (ref 0–5)
HCT: 36.2 % — ABNORMAL LOW (ref 39.0–52.0)
HEMOGLOBIN: 11.9 g/dL — AB (ref 13.0–17.0)
LYMPHS ABS: 1.5 10*3/uL (ref 0.7–4.0)
LYMPHS PCT: 28 % (ref 12–46)
MCH: 29.5 pg (ref 26.0–34.0)
MCHC: 32.9 g/dL (ref 30.0–36.0)
MCV: 89.6 fL (ref 78.0–100.0)
MONOS PCT: 8 % (ref 3–12)
Monocytes Absolute: 0.4 10*3/uL (ref 0.1–1.0)
Neutro Abs: 3.4 10*3/uL (ref 1.7–7.7)
Neutrophils Relative %: 61 % (ref 43–77)
PLATELETS: 273 10*3/uL (ref 150–400)
RBC: 4.04 MIL/uL — AB (ref 4.22–5.81)
RDW: 12.6 % (ref 11.5–15.5)
WBC: 5.5 10*3/uL (ref 4.0–10.5)

## 2015-02-15 MED ORDER — GLUCERNA 1.2 CAL PO LIQD
1000.0000 mL | ORAL | Status: DC
  Administered 2015-02-16 – 2015-02-21 (×8): 1000 mL
  Filled 2015-02-15 (×17): qty 1000

## 2015-02-15 MED ORDER — SENNOSIDES 8.8 MG/5ML PO SYRP
10.0000 mL | ORAL_SOLUTION | Freq: Every day | ORAL | Status: DC
  Administered 2015-02-15 – 2015-02-16 (×2): 10 mL
  Filled 2015-02-15 (×3): qty 10

## 2015-02-15 MED ORDER — FREE WATER: 200.0000 mL | Freq: Three times a day (TID) | Status: DC

## 2015-02-15 MED ORDER — FREE WATER
250.0000 mL | Freq: Three times a day (TID) | Status: DC
  Administered 2015-02-15 – 2015-02-16 (×6): 250 mL

## 2015-02-15 NOTE — Evaluation (Signed)
Occupational Therapy Assessment and Plan  Patient Details  Name: Russell Austin MRN: [id number] Date of Birth: November 08, 1950  OT Diagnosis: hemiplegia affecting non-dominant side Rehab Potential: Rehab Potential (ACUTE ONLY): Excellent ELOS: 14-16 days   Today's Date: 02/15/2015 OT Individual Time: 1000-1100 OT Individual Time Calculation (min): 60 min     Problem List:  Patient Active Problem List   Diagnosis Date Noted  . Cerebral infarction due to thrombosis of basilar artery   . CKD stage 2 due to type 2 diabetes mellitus 02/12/2015  . Right hemiparesis   . CVA (cerebral infarction) 02/10/2015  . CKD (chronic kidney disease) 02/10/2015  . Type 2 diabetes mellitus with diabetic neuropathy 02/10/2015  . Neck pain   . Numbness and tingling 02/09/2015  . Back pain, acute 10/30/2012  . Hematuria of undiagnosed cause 10/30/2012  . Proteinuria 10/30/2012  . Left shoulder pain 07/17/2012  . Plantar fasciitis, bilateral 07/17/2012  . HEADACHE 01/07/2009  . Peripheral neuropathy 11/06/2007  . ERECTILE DYSFUNCTION, MILD 11/06/2007  . 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 Essential hypertension 07/08/2007  . EXTRINSIC ASTHMA, UNSPECIFIED 06/25/2007    Past Medical History:  Past Medical History  Diagnosis Date  . Diabetes mellitus type II   . Hypertension   . ED (erectile dysfunction)   . Diabetic retinopathy FOLLOWED BY DR Zadie Rhine  . 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 Glaucoma of both eyes   . Left hydrocele    Past Surgical History:  Past Surgical History  Procedure Laterality Date  . Undescended right testicle removed  1994  . Right eye vitrectomy/ insertion glaucoma seton/ laser repair  12-13-2008    RETINAL ARTERY OCCLUSION /NEOVASCULAR GLAUCOMA/ HEMORRHAGE  . Right eye vitretomy/ insertion glaucoma seton x2/ laser  03-24-2009    RECURRENT HEMORRHAGE/ OCCLUSION INTERNAL SETON  . Left eye laser retina  repair  SEPT 2012  . Appendectomy  AGE EARLY 20'S  . Hydrocele excision  03/31/2012    Procedure: HYDROCELECTOMY ADULT;  Surgeon: Franchot Gallo, MD;  Location: [facility];  Service: Urology;  Laterality: Left;  45 MINS      Assessment & Plan Clinical Impression:  Russell Austin is a 64 y.o. male with history of DM type 2 with retinopathy, HTN, glaucoma, who was admitted on 02/09/15 with reports of  heaviness of RUE/RLE with intermittent numbness, pins and needles since waking that am. He underwent colonoscopy that am and after procedure was noted to have difficulty walking as well as difficulty swallowing while in ED.  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  2D echo showed EF 60% with no wall abnormality. Carotid dopplers were negative for significant ICA stenosis. MBS done revealing severe oro-pharyngeal dysphagia. Therefore NPO was recommended and NGT was placed for nutritional support. Dr Erlinda Hong evaluated patient and recommended ASA for left medullary infarct most likely due to small vessel disease.   On 06/11 am, patient had increase in right sided weakness and MRI of brain was done revealing evolutionary change with slight increase in left paramedian infarct. Neurology felt that this was natural progression and no new recommendations noted. He is showing improvement in right sided weakness with antigravity strength today. Therapy ongoing and CIR was recommended by MD and Rehab team.  Patient transferred to CIR on 02/14/2015 .    Patient currently requires mod with basic self-care skills secondary to abnormal tone and decreased coordination, decreased attention to right and decreased sitting balance, decreased standing balance, decreased postural control and hemiplegia.   Prior to hospitalization, patient was fully independent, driving, cooking.  Patient will benefit from skilled intervention to increase independence with basic self-care skills and increase level of independence with iADL prior to discharge home with care partner.  Anticipate patient will require intermittent supervision and follow up outpatient.  OT - End of Session Endurance Deficit: No Endurance Deficit Description: fatigues after about 10 minutes standing activities OT Assessment Rehab Potential (ACUTE ONLY): Excellent OT Patient demonstrates impairments in the following area(s): Balance;Motor;Perception OT Basic ADL's Functional Problem(s): Grooming;Bathing;Dressing;Toileting OT Transfers Functional Problem(s): Tub/Shower;Toilet OT Additional Impairment(s): Fuctional Use of Upper Extremity OT Plan OT Intensity: Minimum of 1-2 x/day, 45 to 90 minutes OT Frequency: 5 out of 7 days OT Duration/Estimated Length of Stay: 14-16 days OT Treatment/Interventions: Balance/vestibular training;Discharge planning;DME/adaptive equipment instruction;Functional mobility training;Neuromuscular re-education;Functional electrical stimulation;Patient/family education;Self Care/advanced ADL retraining;Therapeutic Activities;Therapeutic Exercise;UE/LE Strength taining/ROM;UE/LE Coordination activities;Visual/perceptual remediation/compensation OT Self Feeding Anticipated Outcome(s): mod I OT Basic Self-Care Anticipated Outcome(s): mod I OT Toileting Anticipated Outcome(s): mod I OT Bathroom Transfers Anticipated Outcome(s): mod I OT Recommendation Patient destination: Home Follow Up Recommendations: Outpatient OT Equipment Recommended: Tub/shower seat   Skilled Therapeutic Intervention Pt seen for initial evaluation and ADL retraining to include shower and dressing. Pt is very motivated and participated actively the entire session. He demonstrates good awareness of R side of body, but slightly impaired  visual attention to R side of environment. In the shower and during dressing, requires cues for foot placement to increase BOS, cues for hand position for stability. Good activity tolerance. Reviewed OT goals and POC based on pt's goals. Reviewed self ROM and AROM exercises for pt to perform on his own. Pt demonstrated good understanding of exercises. Pt left in room in w/c with all needs met.  OT Evaluation Precautions/Restrictions  Precautions Precautions: Fall Precaution Comments: NPO   Vital Signs Therapy Vitals Pulse Rate: 65 BP: 133/74 mmHg Oxygen Therapy SpO2: 100 % O2 Device: Not Delivered Pain Pain Assessment Pain Assessment: No/denies pain Home Living/Prior Functioning Home Living Living Arrangements: Spouse/significant other Available Help at Discharge: Family, Available 24 hours/day Type of Home: House Home Access: Stairs to enter [employer] of Steps: 2 in garage no rails, 4 in front rails on both sides Home Layout: Two level, Bed/bath upstairs, 1/2 bath on main level Alternate Level Stairs-Number of Steps: 14 Alternate Level Stairs-Rails: Left  Lives With: Spouse Prior Function Level of Independence: Independent with basic ADLs, Independent with homemaking with ambulation, Independent with gait, Independent with transfers  Able to Take Stairs?: Yes Driving: Yes Vocation: On disability Leisure: Hobbies-yes (Comment) 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 ADL ADL ADL Comments: refer to FIM Vision/Perception  Vision- History Baseline Vision/History: Legally blind (in right eye) Patient Visual Report: No change from baseline Vision- Assessment Additional Comments: patient reports no change, but noted right rotary nystagmus with rightward gaze, undershooting on saccades  Cognition Overall Cognitive Status: Within Functional Limits for tasks assessed Arousal/Alertness: Awake/alert Orientation Level: Place;Person;Situation Person:  Oriented Place: Oriented Situation: Oriented Year: 2016 Month: June Day of Week: Correct Immediate Memory Recall: Sock;Blue;Bed Memory Recall: Sock;Blue;Bed Memory Recall Sock: Without Cue Memory Recall Blue: Without Cue Memory Recall Bed: Without Cue Sustained Attention: Appears intact Problem Solving: Appears intact Behaviors: Impulsive (mild impulsivity with  transfers, movement) Safety/Judgment: Appears intact Sensation Sensation Light Touch: Appears Intact Stereognosis: Appears Intact Hot/Cold: Appears Intact Proprioception: Appears Intact Coordination Gross Motor Movements are Fluid and Coordinated: No Fine Motor Movements are Fluid and Coordinated: No Coordination and Movement Description: decreased due to R hemiplegia Finger Nose Finger Test: NT Motor  Motor Motor: Hemiplegia;Abnormal tone Motor - Skilled Clinical Observations: right UE/LE hemiparesis with increased tone throughout Mobility  Bed Mobility Bed Mobility: Supine to Sit;Sit to Supine Supine to Sit: 5: Supervision;With rails;HOB flat Sit to Supine: 4: Min assist Sit to Supine - Details (indicate cue type and reason): assist for right foot into bed  Trunk/Postural Assessment  Cervical Assessment Cervical Assessment: Within Functional Limits Thoracic Assessment Thoracic Assessment: Within Functional Limits Lumbar Assessment Lumbar Assessment: Within Functional Limits Postural Control Postural Control: Deficits on evaluation Protective Responses: reaching right had near loss of balance Postural Limitations: posterior pelvic tilt, forward head, shortened trunk on left, elongated on right  Balance Balance Balance Assessed: Yes Standardized Balance Assessment Standardized Balance Assessment: Berg Balance Test Berg Balance Test Sit to Stand: Needs minimal aid to stand or to stabilize Standing Unsupported: Needs several tries to stand 30 seconds unsupported Sitting with Back Unsupported but Feet  Supported on Floor or Stool: Able to sit safely and securely 2 minutes Stand to Sit: Needs assistance to sit Transfers: Needs one person to assist Standing Unsupported with Eyes Closed: Able to stand 10 seconds with supervision Standing Ubsupported with Feet Together: Needs help to attain position but able to stand for 30 seconds with feet together From Standing, Reach Forward with Outstretched Arm: Reaches forward but needs supervision From Standing Position, Pick up Object from Floor: Unable to pick up and needs supervision From Standing Position, Turn to Look Behind Over each Shoulder: Needs assist to keep from losing balance and falling Turn 360 Degrees: Needs assistance while turning Standing Unsupported, Alternately Place Feet on Step/Stool: Needs assistance to keep from falling or unable to try Standing Unsupported, One Foot in Front: Loses balance while stepping or standing Standing on One Leg: Unable to try or needs assist to prevent fall Total Score: 13 Dynamic Sitting Balance Dynamic Sitting - Balance Support: No upper extremity supported;Feet supported Dynamic Sitting - Level of Assistance: 5: Stand by assistance Static Standing Balance Static Standing - Balance Support: Left upper extremity supported;No upper extremity supported Static Standing - Level of Assistance: 5: Stand by assistance;4: Min assist Static Standing - Comment/# of Minutes: after assisted into stable position able to stand statice up to 30 seconds Dynamic Standing Balance Dynamic Standing - Level of Assistance: 3: Mod assist Extremity/Trunk Assessment RUE Assessment RUE Assessment: Exceptions to [facility] RUE AROM (degrees) Right Shoulder Flexion: 80 Degrees Right Shoulder ABduction: 80 Degrees Right Elbow Flexion: 110 Right Wrist Extension: 30 Degrees Right Composite Finger Extension: 75% Right Composite Finger Flexion: 75% LUE Assessment LUE Assessment: Within Functional Limits  FIM:  FIM -  Eating Eating Activity: 1: Helper performs IV, parenteral, or tube feeding FIM - Grooming Grooming Steps: Wash, rinse, dry face;Wash, rinse, dry hands;Oral care, brush teeth, clean dentures;Brush, comb hair Grooming: 5: Set-up assist to obtain items FIM - Bathing Bathing Steps Patient Completed: Chest;Right Arm;Abdomen;Front perineal area;Right upper leg;Left upper leg;Left lower leg (including foot) Bathing: 3: Mod-Patient completes 5-7 [id number] parts or 50-74% FIM - Upper Body Dressing/Undressing Upper body dressing/undressing steps patient completed: Thread/unthread left sleeve of pullover shirt/dress;Put head through opening of pull over shirt/dress Upper body dressing/undressing: 3: Mod-Patient completed 50-74% of tasks  FIM - Lower Body Dressing/Undressing Lower body dressing/undressing steps patient completed: Pull underwear up/down;Pull pants up/down;Thread/unthread left underwear leg;Thread/unthread left pants leg Lower body dressing/undressing: 2: Max-Patient completed 25-49% of tasks FIM - Toileting Toileting: 0: Activity did not occur FIM - Control and instrumentation engineer Devices: Arm rests Bed/Chair Transfer: 5: Supine > Sit: Supervision (verbal cues/safety issues);4: Sit > Supine: Min A (steadying pt. > 75%/lift 1 leg);3: Bed > Chair or W/C: Mod A (lift or lower assist);3: Chair or W/C > Bed: Mod A (lift or lower assist) FIM - Tub/Shower Transfers Tub/Shower Assistive Devices: Walk in shower;Grab bars;Shower Clinical biochemist Transfers: 4-Into Tub/Shower: Min A (steadying Pt. > 75%/lift 1 leg);4-Out of Tub/Shower: Min A (steadying Pt. > 75%/lift 1 leg)   Refer to Care Plan for Long Term Goals  Recommendations for other services: None  Discharge Criteria: Patient will be discharged from OT if patient refuses treatment 3 consecutive times without medical reason, if treatment goals not met, if there is a change in medical status, if patient makes no progress towards  goals or if patient is discharged from hospital.  The above assessment, treatment plan, treatment alternatives and goals were discussed and mutually agreed upon: by patient  [facility] 02/15/2015, 12:42 PM

## 2015-02-15 NOTE — Progress Notes (Signed)
Initial Nutrition Assessment  DOCUMENTATION CODES:  Not applicable  INTERVENTION:  Tube feeding: Continue Glucerna 1.2 formula via NGT at goal rate of 80 ml/hr x 20 hours to provide 1920 kcal (100% of needs), 96 grams of protein, and 1296 ml of free water.  Provide free water flushes 200 ml TID.   RD to continue to monitor.   NUTRITION DIAGNOSIS:  Inadequate oral intake related to inability to eat as evidenced by NPO status.  GOAL:  Patient will meet greater than or equal to 90% of their needs  MONITOR:  TF tolerance, Weight trends, Labs, I & O's  REASON FOR ASSESSMENT:  Consult Enteral/tube feeding initiation and management  ASSESSMENT: Pt with history of DM type 2 with retinopathy, HTN, glaucoma, who was admitted on 02/09/15 with reports of heaviness of RUE/RLE with intermittent numbness, pins and needles. 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 MBS done revealing severe oro-pharyngeal dysphagia. Therefore NPO was recommended and NGT was placed for nutritional support.  Pt currently has Glucerna 1.2 infusing via NGT at 75 ml/hr x 20 hours, which provides 1800 kcal (95% of kcal needs), 90 grams of protein, and 1215 ml of free water. Pt has been tolerating his tube feeds fine. Noted pt with a 14.8% weight loss in 2 months, pt however reports weight loss has been intentional. RD to adjust tube feeding orders to provide 100% of estimated nutritional needs. RD to continue to monitor.   Pt with no observed significant fat or muscle mass loss.   Labs: Low GFR. High BUN, creatinine, and total bilirubin.   Height:  Ht Readings from Last 1 Encounters:  02/09/15 5\' 9"  (1.753 m)    Weight:  Wt Readings from Last 1 Encounters:  02/14/15 155 lb 13.8 oz (70.7 kg)    Ideal Body Weight:  72.7 kg  Wt Readings from Last 10 Encounters:  02/14/15 155 lb 13.8 oz (70.7 kg)  12/20/14 182 lb  (82.555 kg)  03/18/14 184 lb (83.462 kg)  12/24/13 184 lb (83.462 kg)  05/13/13 183 lb (83.008 kg)  10/30/12 182 lb (82.555 kg)  07/17/12 185 lb (83.915 kg)  03/26/12 185 lb (83.915 kg)  07/02/11 184 lb (83.462 kg)  06/01/10 188 lb (85.276 kg)    BMI:  Body Mass Index: 23.10 kg/(m^2)  Estimated Nutritional Needs:  Kcal:  1900-2100  Protein:  90-100 grams  Fluid:  1.9 - 2.1 L/day  Skin:  Reviewed, no issues  Diet Order:  Diet NPO time specified  EDUCATION NEEDS:  No education needs identified at this time   Intake/Output Summary (Last 24 hours) at 02/15/15 1343 Last data filed at 02/15/15 1114  Gross per 24 hour  Intake    400 ml  Output    200 ml  Net    200 ml    Last BM:  6/8 - bowel meds given  Corrin Parker, MS, RD, LDN Pager # [phone] After hours/ weekend pager # [phone]

## 2015-02-15 NOTE — Progress Notes (Signed)
Patient LBM documented 02/09/15. Offered suppository this am pt refused. Pt states maybe later.  Michelene Heady, RN

## 2015-02-15 NOTE — Progress Notes (Signed)
64 y.o. male with history of DM type 2 with retinopathy, HTN, glaucoma, who was admitted on 02/09/15 with reports of  heaviness of RUE/RLE with intermittent numbness, pins and needles since waking that am. He underwent colonoscopy that am and after procedure was noted to have difficulty walking as well as difficulty swallowing while in ED.  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  2D echo showed EF 60% with no wall abnormality. Carotid dopplers were negative for significant ICA stenosis. MBS done revealing severe oro-pharyngeal dysphagia. Therefore NPO was recommended and NGT was placed for nutritional support. Dr Erlinda Hong evaluated patient and recommended ASA for left medullary infarct most likely due to small vessel disease.   Subjective/Complaints: No problems during his initial rehabilitation sessions. ROS- No CP or SOB Objective: Vital Signs: Blood pressure 140/70, pulse 61, temperature 98.3 F (36.8 C), temperature source Oral, resp. rate 18, SpO2 98 %. No results found. Results for orders placed or performed during the hospital encounter of 02/14/15 (from the past 72 hour(s))  Glucose, capillary     Status: Abnormal   Collection Time: 02/15/15 12:19 AM  Result Value Ref Range   Glucose-Capillary 194 (H) 65 - 99 mg/dL   Comment 1 Notify RN   Glucose, capillary     Status: Abnormal   Collection Time: 02/15/15  6:07 AM  Result Value Ref Range   Glucose-Capillary 149 (H) 65 - 99 mg/dL   Comment 1 Notify RN   CBC WITH DIFFERENTIAL     Status: Abnormal   Collection Time: 02/15/15  7:05 AM  Result Value Ref Range   WBC 5.5 4.0 - 10.5 K/uL   RBC 4.04 (L) 4.22 - 5.81 MIL/uL   Hemoglobin 11.9 (L) 13.0 - 17.0 g/dL   HCT 36.2 (L) 39.0 - 52.0 %   MCV 89.6 78.0 - 100.0 fL   MCH 29.5 26.0 - 34.0  pg   MCHC 32.9 30.0 - 36.0 g/dL   RDW 12.6 11.5 - 15.5 %   Platelets 273 150 - 400 K/uL   Neutrophils Relative % 61 43 - 77 %   Neutro Abs 3.4 1.7 - 7.7 K/uL   Lymphocytes Relative 28 12 - 46 %   Lymphs Abs 1.5 0.7 - 4.0 K/uL   Monocytes Relative 8 3 - 12 %   Monocytes Absolute 0.4 0.1 - 1.0 K/uL   Eosinophils Relative 2 0 - 5 %   Eosinophils Absolute 0.1 0.0 - 0.7 K/uL   Basophils Relative 1 0 - 1 %   Basophils Absolute 0.0 0.0 - 0.1 K/uL  Comprehensive metabolic panel     Status: Abnormal   Collection Time: 02/15/15  7:05 AM  Result Value Ref Range   Sodium 144 135 - 145 mmol/L   Potassium 4.3 3.5 - 5.1 mmol/L   Chloride 107 101 - 111 mmol/L   CO2 29 22 - 32 mmol/L   Glucose, Bld 195 (H) 65 - 99 mg/dL   BUN 35 (H) 6 - 20 mg/dL   Creatinine, Ser 1.25 (H) 0.61 - 1.24 mg/dL   Calcium 9.1 8.9 - 10.3 mg/dL   Total Protein 6.4 (L) 6.5 - 8.1 g/dL   Albumin 3.3 (L) 3.5 - 5.0 g/dL   AST 22 15 - 41 U/L   ALT 18 17 - 63 U/L  Alkaline Phosphatase 68 38 - 126 U/L   Total Bilirubin 1.4 (H) 0.3 - 1.2 mg/dL   GFR calc non Af Amer 60 (L) >60 mL/min   GFR calc Af Amer >60 >60 mL/min    Comment: (NOTE) The eGFR has been calculated using the CKD EPI equation. This calculation has not been validated in all clinical situations. eGFR's persistently <60 mL/min signify possible Chronic Kidney Disease.    Anion gap 8 5 - 15     HEENT: normal Cardio: RRR and no murmur Resp: CTA B/L and unlabored GI: BS positive and NT, ND Extremity:  Pulses positive and No Edema Skin:   Intact Neuro: Alert/Oriented   Assessment/Plan: 1. 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 Physiatrist is providing close team supervision and 24 hour management of active medical problems listed below. Physiatrist and rehab team continue to assess barriers to  discharge/monitor patient progress toward functional and medical goals. FIM:                                  Medical Problem List and Plan: 1. 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  DVT Prophylaxis/Anticoagulation: Pharmaceutical: Lovenox 3. Pain Management: N/A 4. Mood: LCSW to follow for evaluation and support. Has good family support.   5. Neuropsych: This patient is capable of making decisions on his own behalf. 6. Skin/Wound Care: Routine pressure relief measures.   7. Fluids/Electrolytes/Nutrition: Continue tube feeds for nutritional support. Add water flushes. Check lytes in am.   8. HTN: Monitor BP every 8 hours. Continue Prinivil daily.   9. Severe dysphagia: NPO. Will have dietician assist with nutritional needs as complaining of hunger--had colonoscopy last Wed.       10. DM type 2: Hgb A1C-7.6. Was on metformin 1000 mg bid with Glucotrol XL 10 mg daily. Now on Lantus due to continuous tube feeds.   11. CKD: Baseline Cr- 1.3.  Creat at basweline Continue to hold HCTZ for now unlesss BP rises. 12. Dyslipidemia: continue Lipitor.    LOS (Days) 1 A FACE TO FACE EVALUATION WAS PERFORMED  Russell Austin E 02/15/2015, 8:10 AM

## 2015-02-15 NOTE — Progress Notes (Signed)
Patient information reviewed and entered into eRehab system by Beila Purdie, RN, CRRN, PPS Coordinator.  Information including medical coding and functional independence measure will be reviewed and updated through discharge.    

## 2015-02-15 NOTE — Evaluation (Signed)
Physical Therapy Assessment and Plan  Patient Details  Name: Russell Austin MRN: [id number] Date of Birth: 1951-01-09  PT Diagnosis: Abnormal posture, Abnormality of gait and Hemiparesis dominant Rehab Potential: Good ELOS: 12-14 days   Today's Date: 02/15/2015 PT Individual Time: 0832-1000 PT Individual Time Calculation (min): 88 min    Problem List:  Patient Active Problem List   Diagnosis Date Noted  . Cerebral infarction due to thrombosis of basilar artery   . CKD stage 2 due to type 2 diabetes mellitus 02/12/2015  . Right hemiparesis   . CVA (cerebral infarction) 02/10/2015  . CKD (chronic kidney disease) 02/10/2015  . Type 2 diabetes mellitus with diabetic neuropathy 02/10/2015  . Neck pain   . Numbness and tingling 02/09/2015  . Back pain, acute 10/30/2012  . Hematuria of undiagnosed cause 10/30/2012  . Proteinuria 10/30/2012  . Left shoulder pain 07/17/2012  . Plantar fasciitis, bilateral 07/17/2012  . HEADACHE 01/07/2009  . Peripheral neuropathy 11/06/2007  . ERECTILE DYSFUNCTION, MILD 11/06/2007  . 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 Essential hypertension 07/08/2007  . EXTRINSIC ASTHMA, UNSPECIFIED 06/25/2007    Past Medical History:  Past Medical History  Diagnosis Date  . Diabetes mellitus type II   . Hypertension   . ED (erectile dysfunction)   . Diabetic retinopathy FOLLOWED BY DR Zadie Rhine  . 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 Glaucoma of both eyes   . Left hydrocele    Past Surgical History:  Past Surgical History  Procedure Laterality Date  . Undescended right testicle removed  1994  . Right eye vitrectomy/ insertion glaucoma seton/ laser repair  12-13-2008    RETINAL ARTERY OCCLUSION /NEOVASCULAR GLAUCOMA/ HEMORRHAGE  . Right eye vitretomy/ insertion glaucoma seton x2/ laser  03-24-2009    RECURRENT HEMORRHAGE/ OCCLUSION INTERNAL SETON  . Left eye laser retina repair  SEPT 2012   . Appendectomy  AGE EARLY 20'S  . Hydrocele excision  03/31/2012    Procedure: HYDROCELECTOMY ADULT;  Surgeon: Franchot Gallo, MD;  Location: [facility];  Service: Urology;  Laterality: Left;  [address] Al[address] is a 64 y.o. male with history of DM type 2 with retinopathy, HTN, glaucoma, who was admitted on 02/09/15 with reports of heaviness of RUE/RLE with intermittent numbness, pins and needles since waking that am. He underwent colonoscopy that am and after procedure was noted to have difficulty walking as well as difficulty swallowing while in ED. 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 2D echo showed EF 60% with no wall abnormality. Carotid dopplers were negative for significant ICA stenosis. MBS done revealing severe oro-pharyngeal dysphagia. Therefore NPO was recommended and NGT was placed for nutritional support. Dr Erlinda Hong evaluated patient and recommended ASA for left medullary infarct most likely due to small vessel disease.   On 06/11 am, patient had increase in right sided weakness and MRI of brain was done revealing evolutionary change with slight increase in left paramedian infarct. Neurology felt that this was natural progression and no new recommendations noted. He is showing improvement in right sided weakness with antigravity strength today. Therapy ongoing and CIR was recommended by MD and Rehab team.  Patient transferred to CIR on  02/14/2015 .    Patient currently requires mod assistwith mobility secondary to muscle weakness, impaired timing and sequencing, abnormal tone, unbalanced muscle activation and decreased coordination and decreased standing balance, decreased postural control and decreased balance  strategies.  Prior to hospitalization, patient was independent  with mobility and lived with Spouse in a House home.  Home access is 2 in garage no rails, 4 in front rails on both sidesStairs to enter.  Patient will benefit from skilled PT intervention to maximize safe functional mobility and minimize fall risk for planned discharge home with intermittent assist.  Anticipate patient will benefit from follow up OP at discharge.  PT - End of Session Activity Tolerance: Tolerates 30+ min activity with multiple rests Endurance Deficit: Yes Endurance Deficit Description: fatigues after about 10 minutes standing activities PT Assessment Rehab Potential (ACUTE/IP ONLY): Good PT Patient demonstrates impairments in the following area(s): Balance;Endurance;Motor;Safety PT Transfers Functional Problem(s): Bed Mobility;Bed to Chair;Car;Furniture;Floor PT Locomotion Functional Problem(s): Ambulation;Stairs PT Plan PT Intensity: Minimum of 1-2 x/day ,45 to 90 minutes PT Frequency: 5 out of 7 days PT Duration Estimated Length of Stay: 14-16 days PT Treatment/Interventions: Ambulation/gait training;Balance/vestibular training;Discharge planning;Cognitive remediation/compensation;DME/adaptive equipment instruction;Functional mobility training;Therapeutic Activities;UE/LE Strength taining/ROM;Splinting/orthotics;Wheelchair propulsion/positioning;UE/LE Coordination activities;Therapeutic Exercise;Stair training;Patient/family education;Neuromuscular re-education PT Transfers Anticipated Outcome(s): mod I PT Locomotion Anticipated Outcome(s): mod I PT Recommendation Follow Up Recommendations: Outpatient PT Patient destination: Home Equipment Recommended: To be determined Equipment Details: likely quad cane, possible AFO  Skilled Therapeutic Intervention Patient seen and evaluation performed.  Patient performed balance test with Berg score 13/56 mainly due to right LE weakness.  Patient ambulated 55' with  rolling walker with hand splint mod assist for right stance stability, balance and right UE stability.  Ambulated next to wall with rail min assist, then back to chair with HHA min/mod assist.  Patient propelled wheelchair 200+' with supervision after initial set up and assist for technique, consistently needing cues for negotiating doorways and obstacles on right side due to visual deficits.  Patient transferred into car from wheelchair min assist.  Negotiated 7 3" stairs with rail mod assist mod cues and demo for technique.  Patient propelled chair back to room at end of session and left with all needs in reach.  PT Evaluation Precautions/Restrictions Precautions Precautions: Fall Precaution Comments: NPO General   Vital SignsTherapy Vitals Pulse Rate: 65 BP: 133/74 mmHg Oxygen Therapy SpO2: 100 % O2 Device: Not Delivered Pain Pain Assessment Pain Assessment: No/denies pain Home Living/Prior Functioning Home Living Living Arrangements: Spouse/significant other Available Help at Discharge: Family;Available 24 hours/day Type of Home: House Home Access: Stairs to enter [employer] of Steps: 2 in garage no rails, 4 in front rails on both sides Home Layout: Two level;Bed/bath upstairs;1/2 bath on main level Alternate Level Stairs-Number of Steps: 14 Alternate Level Stairs-Rails: Left  Lives With: Spouse Prior Function Level of Independence: Independent with basic ADLs;Independent with homemaking with ambulation;Independent with gait;Independent with transfers  Able to Take Stairs?: Yes Driving: Yes Vocation: On disability Leisure: Hobbies-yes (Comment) 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 Vision/Perception  Vision - Assessment Additional Comments: patient reports no change, but noted right rotary nystagmus with rightward gaze, undershooting on saccades  Cognition Overall Cognitive Status: Within Functional Limits for tasks assessed Arousal/Alertness:  Awake/alert Orientation Level: Oriented X4 Sustained Attention: Appears intact Problem Solving: Appears intact Behaviors: Impulsive (mild impulsivity with transfers, movement) Safety/Judgment: Appears intact Sensation Sensation Light Touch: Appears Intact Stereognosis: Appears Intact Hot/Cold: Appears Intact Proprioception: Appears Intact Coordination Gross Motor Movements  are Fluid and Coordinated: No Fine Motor Movements are Fluid and Coordinated: No Coordination and Movement Description: decreased due to R hemiplegia Finger Nose Finger Test: NT Motor  Motor Motor: Hemiplegia;Abnormal tone Motor - Skilled Clinical Observations: right UE/LE hemiparesis with increased tone throughout  Mobility Bed Mobility Bed Mobility: Supine to Sit;Sit to Supine Supine to Sit: 5: Supervision;With rails;HOB flat Sit to Supine: 4: Min assist Sit to Supine - Details (indicate cue type and reason): assist for right foot into bed Transfers Transfers: Yes Stand Pivot Transfers: 3: Mod assist Stand Pivot Transfer Details: Verbal cues for precautions/safety;Verbal cues for technique Stand Pivot Transfer Details (indicate cue type and reason): cues to place left hand on bed and step around recliner to bed Locomotion  Ambulation Ambulation: Yes Ambulation/Gait Assistance: 3: Mod assist Ambulation Distance (Feet): 35 Feet Assistive device: Rolling walker (with right UE splint) Ambulation/Gait Assistance Details: Verbal cues for technique;Verbal cues for gait pattern;Manual facilitation for weight shifting;Verbal cues for safe use of DME/AE;Verbal cues for sequencing Ambulation/Gait Assistance Details:  Shoe cover on right toe. Facilitation for right hip activation in stance to limit right knee recurvatum; also walked about 30' holding wall rail, then turn and HHA Wheelchair Mobility Wheelchair Mobility: Yes Wheelchair Assistance: 4: Audiological scientist Details: Verbal cues for safe use  of DME/AE;Verbal cues for technique;Verbal cues for precautions/safety Wheelchair Propulsion: Left upper extremity;Left lower extremity Wheelchair Parts Management: Needs assistance Distance: 200+  Trunk/Postural Assessment  Cervical Assessment Cervical Assessment: Within Functional Limits Thoracic Assessment Thoracic Assessment: Within Functional Limits Lumbar Assessment Lumbar Assessment: Within Functional Limits Postural Control Postural Control: Deficits on evaluation Protective Responses: reaching right had near loss of balance Postural Limitations: posterior pelvic tilt, forward head, shortened trunk on left, elongated on right  Balance Balance Balance Assessed: Yes Standardized Balance Assessment Standardized Balance Assessment: Berg Balance Test Berg Balance Test Sit to Stand: Needs minimal aid to stand or to stabilize Standing Unsupported: Needs several tries to stand 30 seconds unsupported Sitting with Back Unsupported but Feet Supported on Floor or Stool: Able to sit safely and securely 2 minutes Stand to Sit: Needs assistance to sit Transfers: Needs one person to assist Standing Unsupported with Eyes Closed: Able to stand 10 seconds with supervision Standing Ubsupported with Feet Together: Needs help to attain position but able to stand for 30 seconds with feet together From Standing, Reach Forward with Outstretched Arm: Reaches forward but needs supervision From Standing Position, Pick up Object from Floor: Unable to pick up and needs supervision From Standing Position, Turn to Look Behind Over each Shoulder: Needs assist to keep from losing balance and falling Turn 360 Degrees: Needs assistance while turning Standing Unsupported, Alternately Place Feet on Step/Stool: Needs assistance to keep from falling or unable to try Standing Unsupported, One Foot in Front: Loses balance while stepping or standing Standing on One Leg: Unable to try or needs assist to prevent  fall Total Score: 13 Dynamic Sitting Balance Dynamic Sitting - Balance Support: No upper extremity supported;Feet supported Dynamic Sitting - Level of Assistance: 5: Stand by assistance Static Standing Balance Static Standing - Balance Support: Left upper extremity supported;No upper extremity supported Static Standing - Level of Assistance: 5: Stand by assistance;4: Min assist Static Standing - Comment/# of Minutes: after assisted into stable position able to stand statice up to 30 seconds Dynamic Standing Balance Dynamic Standing - Level of Assistance: 3: Mod assist Extremity Assessment  RUE Assessment RUE Assessment: Exceptions to [facility] RUE AROM (degrees) Right Shoulder  Flexion: 80 Degrees Right Shoulder ABduction: 80 Degrees Right Elbow Flexion: 110 Right Wrist Extension: 30 Degrees Right Composite Finger Extension: 75% Right Composite Finger Flexion: 75% LUE Assessment LUE Assessment: Within Functional Limits RLE Assessment RLE Assessment: Exceptions to [facility] RLE Strength RLE Overall Strength: Deficits RLE Overall Strength Comments: hip flexion 3+/5, knee extension 4-/5, ankle DF 3+/5 RLE Tone RLE Tone Comments: increased tone throughout approx 2/4 modified [place]  LLE Assessment LLE Assessment: Within Functional Limits  FIM:  FIM - Control and instrumentation engineer Devices: Arm rests Bed/Chair Transfer: 5: Supine > Sit: Supervision (verbal cues/safety issues);4: Sit > Supine: Min A (steadying pt. > 75%/lift 1 leg);3: Bed > Chair or W/C: Mod A (lift or lower assist);3: Chair or W/C > Bed: Mod A (lift or lower assist) FIM - Locomotion: Wheelchair Distance: 200+ Locomotion: Wheelchair: 4: Travels 150 ft or more: maneuvers on rugs and over door sillls with minimal assistance (Pt.>75%) FIM - Locomotion: Ambulation Locomotion: Ambulation Assistive Devices: Walker - Rolling;Other (comment) (with right UE splint; then with wall rail) Ambulation/Gait Assistance: 3:  Mod assist Locomotion: Ambulation: 2: Travels 50 - 149 ft with moderate assistance (Pt: 50 - 74%) FIM - Locomotion: Stairs Locomotion: Scientist, physiological: Hand rail - 1 Locomotion: Stairs: 2: Up and Down 4 - 11 stairs with moderate assistance (Pt: 50 - 74%)   Refer to Care Plan for Long Term Goals  Recommendations for other services: None  Discharge Criteria: Patient will be discharged from PT if patient refuses treatment 3 consecutive times without medical reason, if treatment goals not met, if there is a change in medical status, if patient makes no progress towards goals or if patient is discharged from hospital.  The above assessment, treatment plan, treatment alternatives and goals were discussed and mutually agreed upon: by patient  [facility] 02/15/2015, 12:40 PM  [place], [place] 02/15/2015

## 2015-02-15 NOTE — Progress Notes (Signed)
[facility] Admission Coordinator Signed Physical Medicine and Rehabilitation PMR Pre-admission 02/14/2015 3:23 PM  Related encounter: ED to Hosp-Admission (Discharged) from 02/09/2015 in [place] Collapse All   PMR Admission Coordinator Pre-Admission Assessment  Patient: Russell Austin is an 64 y.o., male MRN: [id number] DOB: February 07, 1951 Height: 5' 9"  (175.3 cm) Weight: 70.7 kg (155 lb 13.8 oz)  Insurance Information HMO: PPO: PCP: IPA: 80/20: OTHER: Blue Options PRIMARY: [place] Policy#: [id number] Subscriber: wife CM Name: Gasper Sells Phone#: [phone] Fax#: [phone] Approval given for 14 days and updates due to Leeann Must (phone: [phone]) at above fax on [phone] Pre-Cert#: [id number] Employer: Wife's employer is [employer] Benefits: Phone #: [phone] Name: [place] [employer]. 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 male with history of DM type 2 with retinopathy, HTN, glaucoma, who was admitted on 02/09/15 with reports of  heaviness of RUE/RLE with intermittent numbness, pins and needles since waking that am. He underwent colonoscopy that am and after procedure was noted to have difficulty walking as well as difficulty swallowing while in ED. 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 2D echo showed EF 60% with no wall abnormality. Carotid dopplers were negative for significant ICA stenosis. MBS done revealing severe oro-pharyngeal dysphagia. Therefore NPO was recommended and NGT was placed for nutritional support. Dr Erlinda Hong evaluated patient and recommended ASA for left medullary infarct most likely due to small vessel disease. PT evaluation done today revealing ataxic gait with impaired balance. CIR recommended by MD and Rehab team.   NIH Total: 3  Past Medical History  Past Medical History  Diagnosis Date  . Diabetes mellitus type II   . Hypertension   . ED (erectile dysfunction)   . Diabetic retinopathy FOLLOWED BY DR Zadie Rhine  . 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 Glaucoma of both eyes   . Left hydrocele     Family History  family history includes Glaucoma in his mother.  Prior Rehab/Hospitalizations:  Has the patient had major surgery during 100 days prior to admission? No  Current Medications   Current facility-administered medications:  . acetaminophen (TYLENOL) suppository 650 mg, 650 mg, Rectal, Q6H PRN, Gardiner Barefoot, NP . acetaminophen (TYLENOL) tablet 650 mg, 650 mg, Oral, Q6H PRN, Kelvin Cellar, MD, 650 mg at 02/11/15 1332 . aspirin suppository 300 mg, 300 mg, Rectal, Daily, 300 mg at 02/10/15 1042 **OR** aspirin tablet 325 mg, 325 mg, Oral, Daily, Kelvin Cellar, MD, 325 mg at 02/14/15 1015 . atorvastatin (LIPITOR) tablet 10 mg, 10  mg, Oral, q1800, Kelvin Cellar, MD, 10 mg at 02/13/15 1736 . feeding supplement (GLUCERNA 1.2 CAL) liquid  1,000 mL, 1,000 mL, Per Tube, Continuous, Jenifer A Williams, RD, Last Rate: 55 mL/hr at 02/14/15 1441, 1,000 mL at 02/14/15 1441 . heparin injection 5,000 Units, 5,000 Units, Subcutaneous, 3 times per day, Lavina Hamman, MD, 5,000 Units at 02/14/15 1354 . insulin aspart (novoLOG) injection 0-15 Units, 0-15 Units, Subcutaneous, 6 times per day, Ritta Slot, NP, 3 Units at 02/14/15 1355 . insulin glargine (LANTUS) injection 20 Units, 20 Units, Subcutaneous, QHS, Kelvin Cellar, MD, 20 Units at 02/13/15 2112 . lisinopril (PRINIVIL,ZESTRIL) tablet 20 mg, 20 mg, Oral, Daily, Kelvin Cellar, MD, 20 mg at 02/14/15 1015 . morphine 2 MG/ML injection 1 mg, 1 mg, Intravenous, Q4H PRN, Gardiner Barefoot, NP, 1 mg at 02/11/15 0058 . tiZANidine (ZANAFLEX) tablet 2 mg, 2 mg, Oral, TID, Kelvin Cellar, MD, 2 mg at 02/14/15 1015 . traMADol (ULTRAM) tablet 50 mg, 50 mg, Oral, Q6H PRN, Kelvin Cellar, MD, 50 mg at 02/11/15 1442  Patients Current Diet: Diet NPO time specified, pt with current Panda Diet - low sodium heart healthy  Precautions / Restrictions Precautions Precautions: Fall Precaution Comments: NPO Restrictions Weight Bearing Restrictions: No   Has the patient had 2 or more falls or a fall with injury in the past year?No  Prior Activity Level Community (5-7x/wk): Pt was independent and active prior to his CVA. He got out and drove everyday and he enjoys playing poker with his friends/brother-in-law.  Home Assistive Devices / Equipment Home Assistive Devices/Equipment: None Home Equipment: None  Prior Device Use: Indicate devices/aids used by the patient prior to current illness, exacerbation or injury? None of the above  Prior Functional Level Prior Function Level of Independence: Independent Comments: retired from driving [place]: Did the patient  need help bathing, dressing, using the toilet or eating? Independent  Indoor Mobility: Did the patient need assistance with walking from room to room (with or without device)? Independent  Stairs: Did the patient need assistance with internal or external stairs (with or without device)? Independent  Functional Cognition: Did the patient need help planning regular tasks such as shopping or remembering to take medications? Independent  Current Functional Level Cognition  Arousal/Alertness: Awake/alert Overall Cognitive Status: Impaired/Different from baseline Orientation Level: Oriented X4 Attention: Sustained Sustained Attention: Appears intact Memory: Impaired Memory Impairment: Retrieval deficit Awareness: Impaired Awareness Impairment: Emergent impairment (in regards to dysphagia) Problem Solving: Appears intact Safety/Judgment: Appears intact   Extremity Assessment (includes Sensation/Coordination)  Upper Extremity Assessment: RUE deficits/detail RUE Deficits / Details: RUE weakness. compensates with trunk flexion to R and shoulder elevation. Difficulty with isolated RUE movement. unable to extend wrust or isolate finger movment. only able to oppose thumb to index finger. Poor uncontrolled movement patterns. improved with visaul feedback. RUE Sensation: decreased proprioception (reports of pain/tingling R hand/foot since CVA) RUE Coordination: decreased fine motor, decreased gross motor (attempting to use as gross assist. Able to pick up large lig)  Lower Extremity Assessment: Defer to PT evaluation RLE Deficits / Details: grossly 4/5 RLE Coordination: decreased gross motor    ADLs  Overall ADL's : Needs assistance/impaired Eating/Feeding: NPO Grooming: Minimal assistance, Sitting Upper Body Bathing: Minimal assitance, Sitting Lower Body Bathing: Moderate assistance, Sit to/from stand Upper Body Dressing : Moderate assistance, Sitting Lower Body Dressing: Moderate  assistance, Sit to/from stand Toilet Transfer: Moderate assistance, Stand-pivot Toileting- Clothing Manipulation and Hygiene: Moderate assistance Functional mobility during ADLs: Moderate assistance (not ambulation) General ADL Comments: Affected by ataxic RUE movements, inccordination and balance deficits  Mobility  Overal bed mobility: Needs Assistance Bed Mobility: Supine to Sit Supine to sit: Supervision, HOB elevated General bed mobility comments: Pt up in chair.    Transfers  Overall transfer level: Needs assistance Equipment used: 1 person hand held assist Transfers: Sit to/from Stand, Stand Pivot Transfers Sit to Stand: Min assist Stand pivot transfers: Mod assist General transfer comment: Assist to maintain midline orientation. Ataxic    Ambulation / Gait / Stairs / Wheelchair Mobility  Ambulation/Gait Ambulation/Gait assistance: Mod assist, +2 safety/equipment Ambulation Distance (Feet): 60 Feet Assistive device: Rolling walker (2 wheeled), 1 person hand held assist General Gait Details: Pt ataxic with rt knee hyperextension.  Gait Pattern/deviations: Step-through pattern, Ataxic, Leaning posteriorly Gait velocity: decr Gait velocity interpretation: Below normal speed for age/gender    Posture / Balance Balance Overall balance assessment: Needs assistance Sitting-balance support: No upper extremity supported, Feet supported Sitting balance-Leahy Scale: Fair Standing balance support: During functional activity (R lat lean. corrects with vc) Standing balance-Leahy Scale: Poor Standing balance comment: min A for balance. posteiror sway    Special needs/care consideration BiPAP/CPAP no CPM no Continuous Drip IV no Dialysis no  Life Vest no  Oxygen no  Special Bed no  Trach Size no  Wound Vac (area) no  Skin - no current issues  Bowel mgmt: last BM on 02-09-15 Bladder mgmt: using urinal Diabetic  mgmt - yes, managed at home with medications   Previous Home Environment Living Arrangements: Spouse/significant other Lives With: Spouse Available Help at Discharge: Family, Available 24 hours/day (could be arranged) Type of Home: House Home Layout: Two level, Bed/bath upstairs, 1/2 bath on main level Alternate Level Stairs-Number of Steps: flight Home Access: Stairs to enter Entrance Stairs-Rails: Left Entrance Stairs-Number of Steps: 1-3 Bathroom Shower/Tub: Gaffer, Charity fundraiser: Standard Bathroom Accessibility: Yes How Accessible: Accessible via walker [place]: No  Discharge Living Setting Plans for Discharge Living Setting: Patient's home Type of Home at Discharge: House Discharge Home Layout: Two level, Bed/bath upstairs Alternate Level Stairs-Number of Steps: flight Discharge Home Access: Stairs to enter Entrance Stairs-Rails: Left Entrance Stairs-Number of Steps: 1-3 Discharge Bathroom Shower/Tub: Walk-in shower Discharge Bathroom Toilet: Standard Does the patient have any problems obtaining your medications?: No  Social/Family/Support Systems Patient Roles: Spouse (enjoys playing cards Nurse, children's) with his friends) Sport and exercise psychologist Information: wife Mila Homer is primary contact Anticipated Caregiver: Note goals are for Mod. Ind. (Pt's wife works during the day, but they have supportive friends/local son who can likely help as needed) Anticipated Caregiver's Contact Information: see above Ability/Limitations of Caregiver: wife does work during the day. Other friends/family may be able to help as needed. Caregiver Availability: Intermittent Discharge Plan Discussed with Primary Caregiver: Yes Is Caregiver In Agreement with Plan?: Yes Does Caregiver/Family have Issues with Lodging/Transportation while Pt is in Rehab?: No  Goals/Additional Needs Patient/Family Goal for Rehab: Mod Ind with PT, OT and SLP Expected length of stay: 7-10 days Cultural  Considerations: Christian Dietary Needs: NPO (pt with Panda currently) Equipment Needs: to be determined Pt/Family Agrees to Admission and willing to participate: Yes (spoke with pt and his wife on 02-14-15) Program Orientation Provided & Reviewed with Pt/Caregiver Including Roles & Responsibilities: Yes   Decrease burden of Care through IP rehab admission: NA   Possible need for SNF placement upon discharge: not anticipated  Patient Condition: This patient's medical and functional status has changed since the consult dated: 02-11-15 in which the Rehabilitation Physician determined and documented that the patient's condition is appropriate  for intensive rehabilitative care in an inpatient rehabilitation facility. See "History of Present Illness" (above) for medical update. Functional changes are: moderate assistance of 2 to ambulate 60' and min to mod assist with self care skills. Patient's medical and functional status update has been discussed with the Rehabilitation physician and patient remains appropriate for inpatient rehabilitation. Will admit to inpatient rehab today.  Preadmission Screen Completed By: Nanetta Batty, PT, 02/14/2015 3:32 PM ______________________________________________________________________  Discussed status with Dr. Naaman Plummer on 02-14-15 at 1523 and received telephone approval for admission today.  Admission Coordinator: Nanetta Batty, PT, time 1523/Date 02-14-15          Cosigned by: Meredith Staggers, MD at 02/14/2015 3:45 PM  Revision History     Date/Time User Provider Type Action   02/14/2015 3:45 PM Meredith Staggers, MD Physician Cosign   02/14/2015 3:32 PM [facility] Admission Coordinator Sign

## 2015-02-15 NOTE — Progress Notes (Signed)
Social Work Assessment and Plan Social Work Assessment and Plan  Patient Details  Name: Russell Austin MRN: [id number] Date of Birth: 03/30/1951  Today's Date: 02/15/2015  Problem List:  Patient Active Problem List   Diagnosis Date Noted  . Cerebral infarction due to thrombosis of basilar artery   . CKD stage 2 due to type 2 diabetes mellitus 02/12/2015  . Right hemiparesis   . CVA (cerebral infarction) 02/10/2015  . CKD (chronic kidney disease) 02/10/2015  . Type 2 diabetes mellitus with diabetic neuropathy 02/10/2015  . Neck pain   . Numbness and tingling 02/09/2015  . Back pain, acute 10/30/2012  . Hematuria of undiagnosed cause 10/30/2012  . Proteinuria 10/30/2012  . Left shoulder pain 07/17/2012  . Plantar fasciitis, bilateral 07/17/2012  . HEADACHE 01/07/2009  . Peripheral neuropathy 11/06/2007  . ERECTILE DYSFUNCTION, MILD 11/06/2007  . 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 Essential hypertension 07/08/2007  . EXTRINSIC ASTHMA, UNSPECIFIED 06/25/2007   Past Medical History:  Past Medical History  Diagnosis Date  . Diabetes mellitus type II   . Hypertension   . ED (erectile dysfunction)   . Diabetic retinopathy FOLLOWED BY DR Zadie Rhine  . 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 Glaucoma of both eyes   . Left hydrocele    Past Surgical History:  Past Surgical History  Procedure Laterality Date  . Undescended right testicle removed  1994  . Right eye vitrectomy/ insertion glaucoma seton/ laser repair  12-13-2008    RETINAL ARTERY OCCLUSION /NEOVASCULAR GLAUCOMA/ HEMORRHAGE  . Right eye vitretomy/ insertion glaucoma seton x2/ laser  03-24-2009    RECURRENT HEMORRHAGE/ OCCLUSION INTERNAL SETON  . Left eye laser retina repair  SEPT 2012  . Appendectomy  AGE EARLY 20'S  . Hydrocele excision  03/31/2012    Procedure: HYDROCELECTOMY ADULT;  Surgeon: Franchot Gallo, MD;  Location: [facility];  Service: Urology;  Laterality: Left;  88 MINS     Social History:  reports that he has never smoked. He has never used smokeless tobacco. He reports that he does not drink alcohol or use illicit drugs.  Family / Support Systems Marital Status: Married How Long?: 40 years on February 20, 2015 Patient Roles: Spouse, Parent, Other (Comment) (grandparent) Spouse/Significant Other: Sylvan Sookdeo - wife - [phone] (h)/ [phone] (m) Children: Delvon, Chipps. - son - [phone] Other Supports: extended family members; [place] Anticipated Caregiver: Note goals are for [employer]. Ind. (Pt's wife works during the day, but they have supportive family/friends/local son who can likely help as needed) Ability/Limitations of Caregiver: wife does work during the day. Other friends/family may be able to help as needed. 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  Pt plans to just adapt to his current condition and continue to enjoy life. Recent Psychosocial Issues: none reported Pyschiatric History: none reported Substance Abuse History: none reported  Patient / Family Perceptions, Expectations & Goals Pt/Family understanding of illness & functional limitations: Pt expresses a good understanding of his condition.  He is frustrated by not being able to eat,  but he understands the importance of his nutrition and is trusting the process and hopes he  can try to eat soon. Premorbid pt/family roles/activities: Pt enljoyed getting out of the house to visit with friends, play cards, and spend time with his grandchildren. Anticipated changes in roles/activities/participation: Pt will not be able to drive for a while. Pt/family expectations/goals: Pt wants to "walk out of the hospital" when he is discharged.  Community Resources Express Scripts: None Premorbid Home Care/DME Agencies: None Transportation available at discharge: Surveyor, minerals referrals recommended: Support group (specify)  Discharge Planning Living Arrangements: Spouse/significant other Support Systems: Spouse/significant other, Children, Other relatives, Friends/neighbors, Social worker community Type of Residence: Private residence Insurance Resources: Multimedia programmer (specify) ([place] PPO) Financial Resources: SSD, Family Support (Wife's income) Financial Screen Referred: No Money Management: Patient, Spouse Does the patient have any problems obtaining your medications?: No Home Management: Pt's family can manage home while he is recovering. Patient/Family Preliminary Plans: Pt plans to go to his home where he will have intermittent supervision.  He can live on the first floor if he cannot negotiate the 14 steps.  He will have access to a half bath. Barriers to Discharge: Steps (14 to get to second floor of home) Social Work Anticipated Follow Up Needs: HH/OP, Support Group Expected length of stay: 7-10 days  Clinical Impression CSW met with pt and his sister-in-law (Ms. Jimmye Norman) to introduce self and role of CSW, as well as to complete assessment.  Pt gave permission for Ms. Williams to stay during our visit.  Pt was very upbeat, positive, and motivated.  CSW offered support should pt experience any frustration or depression, but pt does not anticipate this as he relies on his faith and positive attitude to help him adapt to this situation.  Pt also  has a lot of support from family, friends, and church family.  Pt plans to return to his help and acknowledges that he will be alone some while his wife is working, but pt feels comfortable with that.  CSW explained that therapists would make recommendations for his follow up care after evaluating him today and we will plan from there.  Pt does not have any concerns/questions/needs at this time.  Pt's wife was working, but CSW left business card and will reach out to her after team conference tomorrow.  CSW will continue to follow.  Shaketta Rill, [name] 02/15/2015, 12:24 PM

## 2015-02-15 NOTE — Evaluation (Signed)
Speech Language Pathology Assessment and Plan  Patient Details  Name: Russell Austin MRN: [id number] Date of Birth: November 12, 1950  SLP Diagnosis: Dysphagia  Rehab Potential: Good ELOS: 14-16 days    Today's Date: 02/15/2015 SLP Individual Time: 1330-1430 SLP Individual Time Calculation (min): 60 min   Problem List:  Patient Active Problem List   Diagnosis Date Noted  . Dysphagia following cerebral infarction 02/15/2015  . Cerebral infarction due to thrombosis of basilar artery   . CKD stage 2 due to type 2 diabetes mellitus 02/12/2015  . Right hemiparesis   . CVA (cerebral infarction) 02/10/2015  . CKD (chronic kidney disease) 02/10/2015  . Type 2 diabetes mellitus with diabetic neuropathy 02/10/2015  . Neck pain   . Numbness and tingling 02/09/2015  . Back pain, acute 10/30/2012  . Hematuria of undiagnosed cause 10/30/2012  . Proteinuria 10/30/2012  . Left shoulder pain 07/17/2012  . Plantar fasciitis, bilateral 07/17/2012  . HEADACHE 01/07/2009  . Peripheral neuropathy 11/06/2007  . ERECTILE DYSFUNCTION, MILD 11/06/2007  . 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 Essential hypertension 07/08/2007  . EXTRINSIC ASTHMA, UNSPECIFIED 06/25/2007   Past Medical History:  Past Medical History  Diagnosis Date  . Diabetes mellitus type II   . Hypertension   . ED (erectile dysfunction)   . Diabetic retinopathy FOLLOWED BY DR Zadie Rhine  . 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 Glaucoma of both eyes   . Left hydrocele    Past Surgical History:  Past Surgical History  Procedure Laterality Date  . Undescended right testicle removed  1994  . Right eye vitrectomy/ insertion glaucoma seton/ laser repair  12-13-2008    RETINAL ARTERY OCCLUSION /NEOVASCULAR GLAUCOMA/ HEMORRHAGE  . Right eye vitretomy/ insertion glaucoma seton x2/ laser  03-24-2009    RECURRENT HEMORRHAGE/ OCCLUSION INTERNAL SETON  . Left eye laser retina  repair  SEPT 2012  . Appendectomy  AGE EARLY 20'S  . Hydrocele excision  03/31/2012    Procedure: HYDROCELECTOMY ADULT;  Surgeon: Franchot Gallo, MD;  Location: [facility];  Service: Urology;  Laterality: Left;  45 MINS      Assessment / Plan / Recommendation Clinical Impression BSE and comm/cog assessments completed. Pt underwent objective swallow evaluation 02/10/15, which revealed severe sensory and motor based pharyngo-cervical esophageal dysphagia due to medullary/pons CVA.   Per MBS report, pt exhibited nearly absent pharyngeal contraction and poor laryngeal elevation, delayed swallow response (trigger at pyriform sinus). Given severity of dysphagia, BSE assessment was limited to ice chips, which pt appeared to tolerate without overt difficulty.  CLQT was administered, and all scores were found to be WNL for his age. Focus of ST intervention during CIR stay will be on dysphagia.      Skilled Therapeutic Interventions          BSE and communication/cognition assessments completed. Results and recommendations discussed with pt/wife, and are in agreement.   SLP Assessment  Patient will need skilled Speech Language Pathology Services during CIR admission    Recommendations  SLP Diet Recommendations: NPO Medication Administration: Via alternative means Oral Care Recommendations: Oral care QID Patient destination: Home Follow up Recommendations: Home Health SLP;Outpatient SLP Equipment Recommended: None recommended by SLP    SLP Frequency 3 to 5 out of 7 days   SLP Treatment/Interventions Patient/family education;Multimodal communication approach;Neuromuscular electrical stimulation;Dysphagia/aspiration precaution training;Therapeutic Exercise    Pain Pain Assessment Pain Assessment: No/denies pain   Prior Functioning Cognitive/Linguistic  Baseline: Within functional limits Type of Home: House  Lives With: Spouse Available Help at Discharge: Family;Available 24  hours/day Education: high school graduate 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 SLP Short Term Goal 3 (Week 1): Pt will participate in repeat MBS to objectively assess swallow function and safety, and to determne readiness to resume po intake.  See FIM for current functional status Refer to Care Plan for Long Term Goals  Recommendations for other services: None  Discharge Criteria: Patient will be discharged from SLP if patient refuses treatment 3 consecutive times without medical reason, if treatment goals not met, if there is a change in medical status, if patient makes no progress towards goals or if patient is discharged from hospital.  The above assessment, treatment plan, treatment alternatives and goals were discussed and mutually agreed upon: by patient and family.  Celia B. [place], [facility], CCC-SLP [phone]  Shonna Chock 02/15/2015, 4:29 PM

## 2015-02-15 NOTE — Progress Notes (Signed)
Meredith Staggers, MD Physician Signed Physical Medicine and Rehabilitation Consult Note 02/11/2015 3:37 PM  Related encounter: ED to Hosp-Admission (Discharged) from 02/09/2015 in [place] Collapse All        Physical Medicine and Rehabilitation Consult Reason for Consult: Right sided weakness, severe dysphagia Referring Physician: Dr. Coralyn Pear   HPI: Russell Austin is a 64 y.o. male with history of DM type 2 with retinopathy, HTN, glaucoma, who was admitted on 02/09/15 with reports of heaviness of RUE/RLE with intermittent numbness, pins and needles since waking that am. He underwent colonoscopy that am and after procedure was noted to have difficulty walking as well as difficulty swallowing while in ED. 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 2D echo showed EF 60% with no wall abnormality. Carotid dopplers were negative for significant ICA stenosis. MBS done revealing severe oro-pharyngeal dysphagia. Therefore NPO was recommended and NGT was placed for nutritional support. Dr Erlinda Hong evaluated patient and recommended ASA for left medullary infarct most likely due to small vessel disease. PT evaluation done today revealing ataxic gait with impaired balance. CIR recommended by MD and Rehab team.    Review of Systems  Eyes:   Blind in right eye.      Past Medical History  Diagnosis Date  . Diabetes mellitus type II   . Hypertension   . ED (erectile dysfunction)   . Diabetic retinopathy FOLLOWED BY DR Zadie Rhine  . 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 Glaucoma of both eyes   . Left hydrocele     Past Surgical History  Procedure Laterality Date  . Undescended right testicle  removed  1994  . Right eye vitrectomy/ insertion glaucoma seton/ laser repair  12-13-2008    RETINAL ARTERY OCCLUSION /NEOVASCULAR GLAUCOMA/ HEMORRHAGE  . Right eye vitretomy/ insertion glaucoma seton x2/ laser  03-24-2009    RECURRENT HEMORRHAGE/ OCCLUSION INTERNAL SETON  . Left eye laser retina repair  SEPT 2012  . Appendectomy  AGE EARLY 20'S  . Hydrocele excision  03/31/2012    Procedure: HYDROCELECTOMY ADULT; Surgeon: Franchot Gallo, MD; Location: [facility]; Service: Urology; Laterality: Left; 34 MINS      Family History  Problem Relation Age of Onset  . Glaucoma Mother     Social History:  reports that he has never smoked. He has never used smokeless tobacco. He reports that he does not drink alcohol or use illicit drugs.     Allergies  Allergen Reactions  . Apple Pectin [Pectin] Itching    ITCHY THROAT  . Peach [Prunus Persica] Itching    ITCHY THROAT   Medications Prior to Admission  Medication Sig Dispense Refill  . glipiZIDE (GLUCOTROL XL) 10 MG 24 hr tablet Take 1 tablet (10 mg total) by mouth daily. 90 tablet 3  . hydrochlorothiazide (HYDRODIURIL) 25 MG tablet TAKE ONE TABLET BY MOUTH EVERY DAY 90 tablet 3  . lisinopril (PRINIVIL,ZESTRIL) 40 MG tablet Take 1 tablet (40 mg total) by mouth daily. 90 tablet 3  . metFORMIN (GLUCOPHAGE) 1000 MG tablet Take 500 mg by mouth 2 (two) times daily with a meal.    . metFORMIN (GLUCOPHAGE) 1000 MG tablet 1 by mouth twice a day (Patient not taking: Reported on 02/09/2015) 180 tablet 3  Home: Home Living Family/patient expects to be discharged to:: Private residence Living Arrangements: Spouse/significant other Available Help at Discharge: Family, Available 24 hours/day (could be arranged) Type of Home: House Home Access: Stairs to enter [employer] of Steps: 1-3 Entrance Stairs-Rails: Left Home  Layout: Two level, Bed/bath upstairs, 1/2 bath on main level Alternate Level Stairs-Number of Steps: flight Home Equipment: None  Functional History: Prior Function Level of Independence: Independent 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 Transfers Overall transfer level: Needs assistance Equipment used: 1 person hand held assist, Rolling walker (2 wheeled) Transfers: Sit to/from Stand, Stand Pivot Transfers Sit to Stand: Min assist Stand pivot transfers: Mod assist General transfer comment: Assist to bring hips up and for balance. Pt ataxic with pivot from recliner to w/c. Ambulation/Gait Ambulation/Gait assistance: Mod assist, +2 safety/equipment Ambulation Distance (Feet): 60 Feet Assistive device: Rolling walker (2 wheeled), 1 person hand held assist General Gait Details: Pt ataxic with rt knee hyperextension.  Gait Pattern/deviations: Step-through pattern, Ataxic, Leaning posteriorly Gait velocity: decr Gait velocity interpretation: Below normal speed for age/gender    ADL:    Cognition: Cognition Overall Cognitive Status: Within Functional Limits for tasks assessed Orientation Level: Oriented X4 Cognition Arousal/Alertness: Awake/alert Behavior During Therapy: WFL for tasks assessed/performed Overall Cognitive Status: Within Functional Limits for tasks assessed  Blood pressure 162/81, pulse 69, temperature 97.7 F (36.5 C), temperature source Oral, resp. rate 16, height 5\' 9"  (1.753 m), weight 72 kg (158 lb 11.7 oz), SpO2 100 %. Physical Exam   Lab Results Last 24 Hours    Results for orders placed or performed during the hospital encounter of 02/09/15 (from the past 24 hour(s))  Glucose, capillary Status: Abnormal   Collection Time: 02/10/15 4:49 PM  Result Value Ref Range   Glucose-Capillary 118 (H) 65 - 99 mg/dL  Glucose, capillary Status:  Abnormal   Collection Time: 02/10/15 9:51 PM  Result Value Ref Range   Glucose-Capillary 172 (H) 65 - 99 mg/dL  Glucose, capillary Status: Abnormal   Collection Time: 02/11/15 12:08 AM  Result Value Ref Range   Glucose-Capillary 221 (H) 65 - 99 mg/dL  Basic metabolic panel Status: Abnormal   Collection Time: 02/11/15 4:10 AM  Result Value Ref Range   Sodium 142 135 - 145 mmol/L   Potassium 3.6 3.5 - 5.1 mmol/L   Chloride 110 101 - 111 mmol/L   CO2 24 22 - 32 mmol/L   Glucose, Bld 163 (H) 65 - 99 mg/dL   BUN 22 (H) 6 - 20 mg/dL   Creatinine, Ser 1.31 (H) 0.61 - 1.24 mg/dL   Calcium 8.8 (L) 8.9 - 10.3 mg/dL   GFR calc non Af Amer 56 (L) >60 mL/min   GFR calc Af Amer >60 >60 mL/min   Anion gap 8 5 - 15  CBC Status: Abnormal   Collection Time: 02/11/15 4:10 AM  Result Value Ref Range   WBC 5.4 4.0 - 10.5 K/uL   RBC 3.98 (L) 4.22 - 5.81 MIL/uL   Hemoglobin 11.9 (L) 13.0 - 17.0 g/dL   HCT 34.8 (L) 39.0 - 52.0 %   MCV 87.4 78.0 - 100.0 fL   MCH 29.9 26.0 - 34.0 pg   MCHC 34.2 30.0 - 36.0 g/dL   RDW 12.6 11.5 - 15.5 %   Platelets 253 150 - 400 K/uL  Glucose, capillary Status: Abnormal   Collection Time: 02/11/15 6:24 AM  Result Value  Ref Range   Glucose-Capillary 160 (H) 65 - 99 mg/dL  Glucose, capillary Status: Abnormal   Collection Time: 02/11/15 7:57 AM  Result Value Ref Range   Glucose-Capillary 157 (H) 65 - 99 mg/dL  Glucose, capillary Status: Abnormal   Collection Time: 02/11/15 12:23 PM  Result Value Ref Range   Glucose-Capillary 151 (H) 65 - 99 mg/dL     Dg Abd 1 View  02/10/2015 CLINICAL DATA: Feeding tube placement. Initial encounter. EXAM: ABDOMEN - 1 VIEW COMPARISON: None. FINDINGS: 1632 hours. Feeding tube tip projects over the left upper quadrant of the abdomen consistent with position  in the mid stomach. The visualized bowel gas pattern is normal. There is some contrast material within the right colon. No evidence of free air. IMPRESSION: Feeding tube tip in the mid stomach. Electronically Signed By: Richardean Sale M.D. On: 02/10/2015 16:42   Ct Cervical Spine Wo Contrast  02/09/2015 CLINICAL DATA: intermittent RIGHT hand numbness that began yesterday. Recent endoscopy. EXAM: CT CERVICAL SPINE WITHOUT CONTRAST TECHNIQUE: Multidetector CT imaging of the cervical spine was performed without intravenous contrast. Multiplanar CT image reconstructions were also generated. COMPARISON: CT head earlier today. FINDINGS: There is no visible cervical spine fracture, traumatic subluxation, prevertebral soft tissue swelling, or intraspinal hematoma. Mild disc space narrowing at C5-6 and C6-7. Multilevel uncinate spurring most pronounced at C6-7, LEFT. No worrisome osseous lesions. Cervicothoracic junction unremarkable. Moderate anterior spurring. Odontoid is intact. No neck masses. Carotid atherosclerosis. Lung apices clear. IMPRESSION: Cervical spondylosis as described; no acute features. Electronically Signed By: Rolla Flatten M.D. On: 02/09/2015 17:44   Mr Jodene Nam Head Wo Contrast  02/09/2015 CLINICAL DATA: Brainstem infarction. Difficulty swallowing. Right-sided numbness. EXAM: MRA HEAD WITHOUT CONTRAST TECHNIQUE: Angiographic images of the Circle of Willis were obtained using MRA technique without intravenous contrast. COMPARISON: MRI brain reported separately. FINDINGS: The internal carotid arteries are widely patent. The basilar artery is slightly irregular but widely patent. LEFT vertebral is the dominant posterior circulation vessel, but both vertebrals contribute to formation of the basilar. There is no proximal stenosis of the anterior or middle cerebral arteries. Minor irregularity ambient P2 segment both posterior cerebral arteries up to 50%. No distal vertebral  irregularity. Both PICA vessels are patent. The LEFT AICA vessel is not visualized. The RIGHT AICA vessel is severely diseased proximally. Both superior cerebellar arteries are unremarkable. IMPRESSION: Intracranial atherosclerotic change as described. No large vessel flow-limiting stenosis or occlusion. The observed pattern of LEFT paramedian lower pontine and upper medullary infarction is most consistent with small vessel disease or brainstem penetrating perforators. Electronically Signed By: Rolla Flatten M.D.  On: 02/09/2015 21:50   Mr Brain Wo Contrast  02/09/2015 CLINICAL DATA: Intermittent RIGHT hand numbness. This also involves the RIGHT shoulder and RIGHT leg. Difficulty swallowing. EXAM: MRI HEAD WITHOUT CONTRAST TECHNIQUE: Multiplanar, multiecho pulse sequences of the brain and surrounding structures were obtained without intravenous contrast. COMPARISON: CT head and cervical spine earlier today. FINDINGS: There is an acute infarction involving the lower pons and upper medulla on the LEFT. This extends over an AP dimension of approximately 1 cm, rostral caudal extent of approximately 1 cm, and is approximately 5 mm thick. The infarct is nonhemorrhagic. Early cytotoxic edema can be seen on T2 and FLAIR imaging. No other similar areas of acute infarction. Generalized atrophy. Mild subcortical and periventricular T2 and FLAIR hyperintensities, likely chronic microvascular ischemic change. Flow voids are maintained throughout the carotid, basilar, and vertebral arteries. There are no areas of chronic hemorrhage. Pituitary, pineal, and cerebellar  tonsils unremarkable. No upper cervical lesions. Visualized calvarium, skull base, and upper cervical osseous structures unremarkable. Scalp and extracranial soft tissues, sinuses, and mastoids show no acute process. Chronic deformity of the RIGHT globe, small and shrunken with chronic vitreous hemorrhage and calcification. IMPRESSION: Acute LEFT  paramedian 5 x 10 x 10 mm. lower brainstem infarction. This is nonhemorrhagic. Chronic changes as described. Electronically Signed By: Rolla Flatten M.D. On: 02/09/2015 21:46   Mr Cervical Spine Wo Contrast  02/09/2015 CLINICAL DATA: Intermittent RIGHT hand numbness beginning yesterday. Mild RIGHT leg numbness beginning today, difficulty swallowing. History of diabetes, hypertension. EXAM: MRI CERVICAL SPINE WITHOUT CONTRAST TECHNIQUE: Multiplanar, multisequence MR imaging of the cervical spine was performed. No intravenous contrast was administered. COMPARISON: CT of the cervical spine February 09, 2015 at 1740 hours. FINDINGS: Cervical vertebral bodies and posterior elements are intact and aligned, straightened cervical lordosis. Moderate C4-5 through C6-7 disc height loss, with decreased T2 signal within all cervical disc consistent with mild desiccation. Moderate to severe acute on chronic discogenic endplate changes at [id number] and C5-6. Moderate to severe chronic discogenic endplate changes D34-534. No STIR signal abnormality to suggest acute osseous process. Cervical spinal cord appears normal morphology and signal characteristics from the cervical medullary junction to level of T3-4, the most caudal well visualized level. Craniocervical junction is intact. Asymmetry of the scalene muscles, the middle and posterior slips on the RIGHT may be developmentally fused versus atrophic posterior slip. Level by level evaluation: C2-3: Uncovertebral hypertrophy in moderate facet arthropathy without canal stenosis or neural foraminal narrowing. C3-4: Uncovertebral hypertrophy. Minimal facet arthropathy without canal stenosis. Mild RIGHT neural foraminal narrowing. C4-5: 2 mm broad-based disc bulge, uncovertebral hypertrophy and minimal facet arthropathy. Mild to moderate canal stenosis. Moderate RIGHT, moderate to severe LEFT neural foraminal narrowing. C5-6: 2 mm broad-based disc bulge, uncovertebral  hypertrophy and minimal facet arthropathy. Mild canal stenosis. Moderate RIGHT, moderate to severe LEFT neural foraminal narrowing. C6-7: Small broad-based disc bulge with tiny broad-based LEFT central disc protrusion. Uncovertebral hypertrophy and minimal facet arthropathy. Mild canal stenosis. Mild RIGHT, moderate LEFT neural foraminal narrowing. C7-T1: No significant disc bulge, canal stenosis or neural foraminal narrowing. IMPRESSION: Straightened cervical lordosis without acute fracture nor malalignment. Degenerative change of the cervical spine resulting in mild to moderate canal stenosis at C4-5, mild at C5-6 and C6-7. Neural foraminal narrowing C3-4 thru C6-7: Moderate to severe on the LEFT at C4-5 and C6-7. Asymmetric scalene muscles: Fused RIGHT middle and posterior slips versus atrophic posterior sludge. Electronically Signed By: Elon Alas M.D. 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 Does the need for close, 24 hr/day medical supervision in concert with the patient's rehab needs make it unreasonable for this patient to be served in a less intensive setting? Yes 2. Co-Morbidities requiring supervision/potential complications: dysphagia, hypertension 3. Due to bladder management, bowel management, safety, skin/wound care, disease management, medication administration, pain management and patient education, does the patient require 24 hr/day rehab nursing? Yes 4. Does the patient require coordinated care of a physician, rehab nurse, PT (1-2 hrs/day, 5 days/week), OT (1-2 hrs/day, 5 days/week) and SLP (1-2 hrs/day, 5 days/week) to address physical and functional deficits in the context of the above medical diagnosis(es)? Yes Addressing deficits in the following areas: balance, endurance, locomotion, strength, transferring, bowel/bladder control, bathing, dressing, feeding, grooming, toileting,  speech, swallowing and psychosocial support 5. Can the patient actively participate in an intensive therapy program of at least 3 hrs  of therapy per day at least 5 days per week? Yes 6. The potential for patient to make measurable gains while on inpatient rehab is excellent 7. Anticipated functional outcomes upon discharge from inpatient rehab are modified independent with PT, modified independent with OT, modified independent with SLP. 8. Estimated rehab length of stay to reach the above functional goals is: 7-10 days 9. Does the patient have adequate social supports and living environment to accommodate these discharge functional goals? Yes 10. Anticipated D/C setting: Home 11. Anticipated post D/C treatments: HH therapy and Outpatient therapy 12. Overall Rehab/Functional Prognosis: excellent  RECOMMENDATIONS: This patient's condition is appropriate for continued rehabilitative care in the following setting: CIR Patient has agreed to participate in recommended program. Yes Note that insurance prior authorization may be required for reimbursement for recommended care.  Comment: Rehab Admissions Coordinator to follow up.  Thanks,  Meredith Staggers, MD, Mellody Drown     02/11/2015       Revision History     Date/Time User Provider Type Action   02/11/2015 3:58 PM Meredith Staggers, MD Physician Sign   02/11/2015 3:52 PM Bary Leriche, PA-C Physician Assistant Pend   View Details Report       Routing History     Date/Time From To Method   02/11/2015 3:58 PM Meredith Staggers, MD Meredith Staggers, MD In Basket   02/11/2015 3:58 PM Meredith Staggers, MD Dorena Cookey, MD In [facility]

## 2015-02-15 NOTE — Progress Notes (Signed)
Pt LBM 6-8. Notified Pam Love, PA with finding with new orders for stool softeners HS. No suppository or enema needed at this time per Algis Liming, PA.

## 2015-02-15 NOTE — Progress Notes (Signed)
Inpatient [facility] Individual Statement of Services  Patient Name:  Russell Austin  Date:  02/15/2015  Welcome to the [facility].  Our goal is to provide you with an individualized program based on your diagnosis and situation, designed to meet your specific needs.  With this comprehensive rehabilitation program, you will be expected to participate in at least 3 hours of rehabilitation therapies Monday-Friday, with modified therapy programming on the weekends.  Your rehabilitation program will include the following services:  Physical Therapy (PT), Occupational Therapy (OT), Speech Therapy (ST), 24 hour per day rehabilitation nursing, Case Management (Social Worker), Rehabilitation Medicine, Nutrition Services and Pharmacy Services  Weekly team conferences will be held on Wednesdays to discuss your progress.  Your Social Worker will talk with you frequently to get your input and to update you on team discussions.  Team conferences with you and your family in attendance may also be held.  Expected length of stay:  14 to 16 days  Overall anticipated outcome:  Modified Independent  Depending on your progress and recovery, your program may change. Your Social Worker will coordinate services and will keep you informed of any changes. Your Social Worker's name and contact numbers are listed  below.  The following services may also be recommended but are not provided by the [facility] will be made to provide these services after discharge if needed.  Arrangements include referral to agencies that provide these services.  Your insurance has been verified to be:  [employer] Your primary doctor is:  Dr. Stevie Kern  Pertinent information will be shared with your doctor and your insurance company.  Social Worker:   Alfonse Alpers, LCSW  [phone] or ([phone]  Information discussed with and copy given to patient by: Trey Sailors, 02/15/2015, 12:03 PM

## 2015-02-16 ENCOUNTER — Inpatient Hospital Stay (HOSPITAL_COMMUNITY): Payer: BC Managed Care – PPO | Admitting: Occupational Therapy

## 2015-02-16 ENCOUNTER — Inpatient Hospital Stay (HOSPITAL_COMMUNITY): Payer: BC Managed Care – PPO | Admitting: *Deleted

## 2015-02-16 ENCOUNTER — Inpatient Hospital Stay (HOSPITAL_COMMUNITY): Payer: BC Managed Care – PPO | Admitting: Physical Therapy

## 2015-02-16 ENCOUNTER — Inpatient Hospital Stay (HOSPITAL_COMMUNITY): Payer: BC Managed Care – PPO

## 2015-02-16 LAB — GLUCOSE, CAPILLARY
Glucose-Capillary: 185 mg/dL — ABNORMAL HIGH (ref 65–99)
Glucose-Capillary: 186 mg/dL — ABNORMAL HIGH (ref 65–99)
Glucose-Capillary: 221 mg/dL — ABNORMAL HIGH (ref 65–99)
Glucose-Capillary: 233 mg/dL — ABNORMAL HIGH (ref 65–99)

## 2015-02-16 NOTE — Progress Notes (Signed)
Speech Language Pathology Daily Session Note  Patient Details  Name: Russell Austin MRN: [id number] Date of Birth: 07/27/1951  Today's Date: 02/16/2015 SLP Individual Time: [id number] SLP Individual Time Calculation (min): 27 min  Short Term Goals: Week 1: SLP Short Term Goal 1 (Week 1): Pt will demonstrate minimal overt s/s aspiration with po trials with supervision verbal cues to determine readines to repeat MBS SLP Short Term Goal 2 (Week 1): Pt will perform pharyngeal strengthening exercises with supervision verbal and visual cues. SLP Short Term Goal 3 (Week 1): Pt will participate in repeat MBS to objectively assess swallow function and safety, and to determne readiness to resume po intake.  Skilled Therapeutic Interventions:  Pt was seen for skilled ST targeting goals for dysphagia.  Upon arrival, pt was seated upright in wheelchair, awake, alert, and agreeable to participate in [place].  Pt completed thorough oral care with instructional cues followed by supervision.  Following oral care, SLP facilitated the session with trials of ice chips to continue working towards readiness for initiation of PO diet.  Pt demonstrated strong reflexive cough on 2 out of 10 trials of ice chips which SLP suspects to be related to delayed swallow initiation.  Pt returned demonstration of targeted pharyngeal strengthening exercises from yesterday's evaluation with supervision cues.  Pt was left in wheelchair with all needs within reach.     FIM:  Comprehension Comprehension Mode: Auditory Comprehension: 5-Understands complex 90% of the time/Cues < 10% of the time Expression Expression Mode: Verbal Expression: 5-Expresses complex 90% of the time/cues < 10% of the time Social Interaction Social Interaction: 6-Interacts appropriately with others with medication or extra time (anti-anxiety, antidepressant). Problem Solving Problem Solving: 5-Solves complex 90% of the time/cues < 10% of the  time Memory Memory: 6-More than reasonable amt of time FIM - Eating Eating Activity: 5: Supervision/cues (with PO trials )  Pain Pain Assessment Pain Assessment: No/denies pain  Therapy/Group: Individual Therapy  Wynetta Seith, Selinda Orion 02/16/2015, 5:07 PM

## 2015-02-16 NOTE — Progress Notes (Signed)
Recreational Therapy Assessment and Plan  Patient Details  Name: Russell Austin MRN: [id number] Date of Birth: 04/18/1951 Today's Date: 02/16/2015  Rehab Potential: Good ELOS: 2 weeks   Assessment Clinical Impression:  Problem List:  Patient Active Problem List   Diagnosis Date Noted  . Cerebral infarction due to thrombosis of basilar artery   . CKD stage 2 due to type 2 diabetes mellitus 02/12/2015  . Right hemiparesis   . CVA (cerebral infarction) 02/10/2015  . CKD (chronic kidney disease) 02/10/2015  . Type 2 diabetes mellitus with diabetic neuropathy 02/10/2015  . Neck pain   . Numbness and tingling 02/09/2015  . Back pain, acute 10/30/2012  . Hematuria of undiagnosed cause 10/30/2012  . Proteinuria 10/30/2012  . Left shoulder pain 07/17/2012  . Plantar fasciitis, bilateral 07/17/2012  . HEADACHE 01/07/2009  . Peripheral neuropathy 11/06/2007  . ERECTILE DYSFUNCTION, MILD 11/06/2007  . 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 Essential hypertension 07/08/2007  . EXTRINSIC ASTHMA, UNSPECIFIED 06/25/2007    Past Medical History:  Past Medical History  Diagnosis Date  . Diabetes mellitus type II   . Hypertension   . ED (erectile dysfunction)   . Diabetic retinopathy FOLLOWED BY DR Zadie Rhine  . 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 Glaucoma of both eyes   . Left hydrocele    Past Surgical History:  Past Surgical History  Procedure Laterality Date  . Undescended right testicle removed  1994  . Right eye vitrectomy/ insertion glaucoma seton/ laser repair  12-13-2008    RETINAL ARTERY OCCLUSION /NEOVASCULAR GLAUCOMA/ HEMORRHAGE  . Right eye vitretomy/ insertion glaucoma seton x2/ laser  03-24-2009    RECURRENT HEMORRHAGE/ OCCLUSION INTERNAL SETON  . Left eye laser retina repair  SEPT 2012  .  Appendectomy  AGE EARLY 20'S  . Hydrocele excision  03/31/2012    Procedure: HYDROCELECTOMY ADULT; Surgeon: Franchot Gallo, MD; Location: [facility]; Service: Urology; Laterality: Left; [address] is a 64 y.o. male with history of DM type 2 with retinopathy, HTN, glaucoma, who was admitted on 02/09/15 with reports of heaviness of RUE/RLE with intermittent numbness, pins and needles since waking that am. He underwent colonoscopy that am and after procedure was noted to have difficulty walking as well as difficulty swallowing while in ED. 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 2D echo showed EF 60% with no wall abnormality. Carotid dopplers were negative for significant ICA stenosis. MBS done revealing severe oro-pharyngeal dysphagia. Therefore NPO was recommended and NGT was placed for nutritional support. Dr Erlinda Hong evaluated patient and recommended ASA for left medullary infarct most likely due to small vessel disease.   On 06/11 am, patient had increase in right sided weakness and MRI of brain was done revealing evolutionary change with slight increase in left paramedian infarct. Neurology felt that this was natural progression and no new recommendations noted. He is showing improvement in right sided weakness with antigravity strength today. Therapy ongoing and CIR was recommended by MD and Rehab team.      Pt presents with decreased activity tolerance, decreased functional mobility, decreased balance, decreased coordination, dysphagia Limiting pt's independence with leisure/community pursuits.   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 Appropriate for Education?: Yes Recreational Therapy Orientation Orientation -Reviewed with patient: Available activity resources Strengths/Weaknesses Patient Strengths/Abilities: Willingness to participate;Active premorbidly Patient weaknesses: Physical limitations TR Patient demonstrates impairments in the following area(s): Endurance;Motor;Safety  Plan Rec Therapy Plan Is patient appropriate for Therapeutic Recreation?: Yes Rehab Potential: Good Treatment times per week: Min 1 time per week >20 minutes Estimated Length of Stay: 2 weeks TR Treatment/Interventions: Adaptive equipment instruction;1:1 session;Balance/vestibular training;Functional mobility training;Community reintegration;Cognitive remediation/compensation;Patient/family education;Therapeutic activities;Recreation/leisure participation;Therapeutic exercise;UE/LE Coordination activities;Visual/perceptual remediation/compensation  Recommendations for other services: None  Discharge Criteria: Patient will be discharged from TR if patient refuses treatment 3 consecutive times without medical reason.  If treatment goals not met, if there is a change in medical status, if patient makes no progress towards goals or if patient is discharged from hospital.  The above assessment, treatment plan, treatment alternatives and goals were discussed and mutually agreed upon: by patient  [place] 02/16/2015, 4:09 PM

## 2015-02-16 NOTE — Progress Notes (Signed)
Physical Therapy Session Note  Patient Details  Name: Russell Austin MRN: [id number] Date of Birth: 10-26-1950  Today's Date: 02/16/2015 PT Individual Time: 1116-1200 PT Individual Time Calculation (min): 44 min   Short Term Goals: Week 1:  PT Short Term Goal 1 (Week 1): Patient will propel wheelchair independent level controlled surfaces including set up for transfers. PT Short Term Goal 2 (Week 1): Patient will ambulate 100' level tile and carpet surfaces with LRAD min assist PT Short Term Goal 3 (Week 1): Patient will negotiate 12 stairs with min assist with railing  PT Short Term Goal 4 (Week 1): Patient will demonstrate decreased fall risk with Berg score at least 22/56.  Skilled Therapeutic Interventions/Progress Updates:   Pt received in w/c; no c/o pain.  Pt return demonstrated w/c parts management and w/c mobility with L hemi technique taught to him yesterday during w/c propulsion x 150' to gym with intermittent min A during changes in direction and through doorways.  Performed w/c mobility x 15' with use of bilat LE propulsion for RLE coordination and motor control training.  Also performed Performed NMR; see below for details.  Pt returned to room and left in w/c with all items within reach.      Therapy Documentation Precautions:  Precautions Precautions: Fall Precaution Comments: NPO Restrictions Weight Bearing Restrictions: No Pain: Pain Assessment Pain Assessment: No/denies pain Locomotion : Ambulation Ambulation/Gait Assistance: 3: Mod assist Wheelchair Mobility Distance: 150  Other Treatments: Treatments Neuromuscular Facilitation: Right;Left;Lower Extremity;Upper Extremity;Forced use;Activity to increase coordination;Activity to increase timing and sequencing;Activity to increase motor control;Activity to increase grading;Activity to increase sustained activation;Activity to increase lateral weight shifting;Activity to increase anterior-posterior weight  shifting during gait forwards, backwards and laterally L and R in standing and squatting x 25' each direction with mod A overall with therapist providing verbal and tactile cues for proximal trunk stability/postural control, sequencing, weight shifting and activation of RLE.  Also performed weight shift training with feet staggered on Rocker Board (R foot forwards) with focus on full anterior weight shift over RLE initiating at pelvis and with translation of tibia; pt required facilitation to activate R hamstring muscles to minimize genu recurvatum and for full tibial translation over R foot.  See FIM for current functional status  Therapy/Group: Individual Therapy  Raylene Everts [facility] 02/16/2015, 12:18 PM

## 2015-02-16 NOTE — Progress Notes (Signed)
Physical Therapy Session Note  Patient Details  Name: Russell Austin MRN: [id number] Date of Birth: [birth year]  Today's Date: 02/16/2015 PT Individual Time: 1300-1415 PT Individual Time Calculation (min): 75 min   Short Term Goals: Week 1:  PT Short Term Goal 1 (Week 1): Patient will propel wheelchair independent level controlled surfaces including set up for transfers. PT Short Term Goal 2 (Week 1): Patient will ambulate 100' level tile and carpet surfaces with LRAD min assist PT Short Term Goal 3 (Week 1): Patient will negotiate 12 stairs with min assist with railing  PT Short Term Goal 4 (Week 1): Patient will demonstrate decreased fall risk with Berg score at least 22/56.  Skilled Therapeutic Interventions/Progress Updates:  W/c>< mat stand pivot, mod assist, keeping R fingers on support surface as he pivoted around; mat> backless bench scooting forward with min asssit   tx focused on neuromuscular re-education via demo, wt bearing, VCs, manual cues, forced use for:   -pelvic dissociation -R lateral leans for RUE wt bearing and activation -trunk lengthening/shortening/rotating in supported sitting, unsupported sitting - facilitation of anterior pelvic tilt in sitting astride backless bench during RUe and bil UE tasks requiring forward wt shift -gait using weighted grocery cart as AD, x 75' with mod/max assist for upright trunk, wt shifting, R hand on handbar. -gait with RW, R hand splint x 15' with mod/max assist for wt shifting, RLe stance stability  W/c propulsion x 150' x with supervision, using hemi technique.  Pt resting in w/c with all needs in reach.     Therapy Documentation Precautions:  Precautions Precautions: Fall Precaution Comments: NPO Restrictions Weight Bearing Restrictions: No   Pain: Pain Assessment Pain Assessment: No/denies pain   Locomotion : Ambulation Ambulation/Gait Assistance: 3: Mod assist Wheelchair Mobility Distance: 150        Other Treatments: Treatments Neuromuscular Facilitation: Right;Left;Lower Extremity;Upper Extremity;Forced use;Activity to increase coordination;Activity to increase timing and sequencing;Activity to increase motor control;Activity to increase grading;Activity to increase sustained activation;Activity to increase lateral weight shifting;Activity to increase anterior-posterior weight shifting  See FIM for current functional status  Therapy/Group: Individual Therapy  Eretria Manternach 02/16/2015, 4:02 PM

## 2015-02-16 NOTE — Progress Notes (Signed)
64 y.o. male with history of DM type 2 with retinopathy, HTN, glaucoma, who was admitted on 02/09/15 with reports of  heaviness of RUE/RLE with intermittent numbness, pins and needles since waking that am. He underwent colonoscopy that am and after procedure was noted to have difficulty walking as well as difficulty swallowing while in ED.  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  2D echo showed EF 60% with no wall abnormality. Carotid dopplers were negative for significant ICA stenosis. MBS done revealing severe oro-pharyngeal dysphagia. Therefore NPO was recommended and NGT was placed for nutritional support. Dr Erlinda Hong evaluated patient and recommended ASA for left medullary infarct most likely due to small vessel disease.   Subjective/Complaints: No problems during his initial rehabilitation sessions. ROS- No CP or SOB Objective: Vital Signs: Blood pressure 141/62, pulse 70, temperature 97.5 F (36.4 C), temperature source Oral, resp. rate 18, weight 72.5 kg (159 lb 13.3 oz), SpO2 98 %. No results found. Results for orders placed or performed during the hospital encounter of 02/14/15 (from the past 72 hour(s))  Glucose, capillary     Status: Abnormal   Collection Time: 02/15/15 12:19 AM  Result Value Ref Range   Glucose-Capillary 194 (H) 65 - 99 mg/dL   Comment 1 Notify RN   Glucose, capillary     Status: Abnormal   Collection Time: 02/15/15  6:07 AM  Result Value Ref Range   Glucose-Capillary 149 (H) 65 - 99 mg/dL   Comment 1 Notify RN   CBC WITH DIFFERENTIAL     Status: Abnormal   Collection Time: 02/15/15  7:05 AM  Result Value Ref Range   WBC 5.5 4.0 - 10.5 K/uL   RBC 4.04 (L) 4.22 - 5.81 MIL/uL   Hemoglobin 11.9 (L) 13.0 - 17.0 g/dL   HCT 36.2 (L) 39.0 - 52.0 %   MCV 89.6 78.0 -  100.0 fL   MCH 29.5 26.0 - 34.0 pg   MCHC 32.9 30.0 - 36.0 g/dL   RDW 12.6 11.5 - 15.5 %   Platelets 273 150 - 400 K/uL   Neutrophils Relative % 61 43 - 77 %   Neutro Abs 3.4 1.7 - 7.7 K/uL   Lymphocytes Relative 28 12 - 46 %   Lymphs Abs 1.5 0.7 - 4.0 K/uL   Monocytes Relative 8 3 - 12 %   Monocytes Absolute 0.4 0.1 - 1.0 K/uL   Eosinophils Relative 2 0 - 5 %   Eosinophils Absolute 0.1 0.0 - 0.7 K/uL   Basophils Relative 1 0 - 1 %   Basophils Absolute 0.0 0.0 - 0.1 K/uL  Comprehensive metabolic panel     Status: Abnormal   Collection Time: 02/15/15  7:05 AM  Result Value Ref Range   Sodium 144 135 - 145 mmol/L   Potassium 4.3 3.5 - 5.1 mmol/L   Chloride 107 101 - 111 mmol/L   CO2 29 22 - 32 mmol/L   Glucose, Bld 195 (H) 65 - 99 mg/dL   BUN 35 (H) 6 - 20 mg/dL   Creatinine, Ser 1.25 (H) 0.61 - 1.24 mg/dL   Calcium 9.1 8.9 - 10.3 mg/dL   Total Protein 6.4 (L) 6.5 - 8.1 g/dL   Albumin 3.3 (L) 3.5 - 5.0 g/dL   AST 22 15 - 41 U/L   ALT  18 17 - 63 U/L   Alkaline Phosphatase 68 38 - 126 U/L   Total Bilirubin 1.4 (H) 0.3 - 1.2 mg/dL   GFR calc non Af Amer 60 (L) >60 mL/min   GFR calc Af Amer >60 >60 mL/min    Comment: (NOTE) The eGFR has been calculated using the CKD EPI equation. This calculation has not been validated in all clinical situations. eGFR's persistently <60 mL/min signify possible Chronic Kidney Disease.    Anion gap 8 5 - 15  Glucose, capillary     Status: Abnormal   Collection Time: 02/15/15 11:53 AM  Result Value Ref Range   Glucose-Capillary 154 (H) 65 - 99 mg/dL  Glucose, capillary     Status: Abnormal   Collection Time: 02/15/15  5:59 PM  Result Value Ref Range   Glucose-Capillary 196 (H) 65 - 99 mg/dL  Glucose, capillary     Status: Abnormal   Collection Time: 02/15/15  9:50 PM  Result Value Ref Range   Glucose-Capillary 200 (H) 65 - 99 mg/dL  Glucose, capillary     Status: Abnormal   Collection Time: 02/16/15 12:24 AM  Result Value Ref Range    Glucose-Capillary 185 (H) 65 - 99 mg/dL  Glucose, capillary     Status: Abnormal   Collection Time: 02/16/15  5:53 AM  Result Value Ref Range   Glucose-Capillary 233 (H) 65 - 99 mg/dL     HEENT: normal Cardio: RRR and no murmur Resp: CTA B/L and unlabored GI: BS positive and NT, ND Extremity:  Pulses positive and No Edema Skin:   Intact Neuro: Alert/Oriented Right upper extremity 3 minus at the deltoid, biceps, triceps, grip 3 minus at the hip flexor on the right 4 minus knee extensor   Assessment/Plan: 1. 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 Physiatrist is providing close team supervision and 24 hour management of active medical problems listed below. Physiatrist and rehab team continue to assess barriers to discharge/monitor patient progress toward functional and medical goals. Team conference today please see physician documentation under team conference tab, met with team face-to-face to discuss problems,progress, and goals. Formulized individual treatment plan based on medical history, underlying problem and comorbidities. May stop TF during therapy FIM: FIM - Bathing Bathing Steps Patient Completed: Chest, Right Arm, Abdomen, Front perineal area, Right upper leg, Left upper leg, Left lower leg (including foot) Bathing: 3: Mod-Patient completes 5-7 [id number] parts or 50-74%  FIM - Upper Body Dressing/Undressing Upper body dressing/undressing steps patient completed: Thread/unthread left sleeve of pullover shirt/dress, Put head through opening of pull over shirt/dress Upper body dressing/undressing: 3: Mod-Patient completed 50-74% of tasks FIM - Lower Body Dressing/Undressing Lower body dressing/undressing steps patient completed: Pull underwear up/down, Pull pants up/down, Thread/unthread left underwear leg, Thread/unthread left  pants leg Lower body dressing/undressing: 2: Max-Patient completed 25-49% of tasks  FIM - Toileting Toileting: 0: Activity did not occur     FIM - BControl and instrumentation engineerDevices: Arm rests Bed/Chair Transfer: 5: Supine > Sit: Supervision (verbal cues/safety issues), 4: Sit > Supine: Min A (steadying pt. > 75%/lift 1 leg), 3: Bed > Chair or W/C: Mod A (lift or lower assist), 3: Chair or W/C > Bed: Mod A (lift or lower assist)  FIM - Locomotion: Wheelchair Distance: 200+ Locomotion: Wheelchair: 4: Travels 150 ft or more: maneuvers on rugs and over door sillls with  minimal assistance (Pt.>75%) FIM - Locomotion: Ambulation Locomotion: Ambulation Assistive Devices: Walker - Rolling, Other (comment) (with right UE splint; then with wall rail) Ambulation/Gait Assistance: 3: Mod assist Locomotion: Ambulation: 2: Travels 50 - 149 ft with moderate assistance (Pt: 50 - 74%)  Comprehension Comprehension Mode: Auditory Comprehension: 5-Understands complex 90% of the time/Cues < 10% of the time  Expression Expression Mode: Verbal Expression: 5-Expresses complex 90% of the time/cues < 10% of the time  Social Interaction Social Interaction: 6-Interacts appropriately with others with medication or extra time (anti-anxiety, antidepressant).  Problem Solving Problem Solving: 5-Solves complex 90% of the time/cues < 10% of the time  Memory Memory: 6-More than reasonable amt of time  Medical Problem List and Plan: 1. 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  DVT Prophylaxis/Anticoagulation: Pharmaceutical: Lovenox 3. Pain Management: N/A 4. Mood: LCSW to follow for evaluation and support. Has good family support.   5. Neuropsych: This patient is capable of making decisions on his own behalf. 6. Skin/Wound Care: Routine pressure relief measures.   7. Fluids/Electrolytes/Nutrition: Continue tube feeds for  nutritional support. Add water flushes. Check lytes in am.   8. HTN: Monitor BP every 8 hours. Continue Prinivil daily.   9. Severe dysphagia: NPO. Will have dietician assist with nutritional needs as complaining of hunger--had colonoscopy last Wed.       10. DM type 2: Hgb A1C-7.6. Was on metformin 1000 mg bid with Glucotrol XL 10 mg daily. Now on Lantus due to continuous tube feeds.   11. CKD: Baseline Cr- 1.3.  Creat at basweline Continue to hold HCTZ for now unlesss BP rises. 12. Dyslipidemia: continue Lipitor.     LOS (Days) 2 A FACE TO FACE EVALUATION WAS PERFORMED  Evea Sheek E 02/16/2015, 9:32 AM

## 2015-02-16 NOTE — Patient Care Conference (Signed)
Inpatient RehabilitationTeam Conference and Plan of Care Update Date: 02/17/2015   Time: 10:35 AM    Patient Name: Russell Austin      Medical Record Number: [id number]  Date of Birth: 24-Jun-1951 Sex: Male         Room/Bed: 4W05C/4W05C-01 Payor Info: Payor: BLUE Administrator, sports / Plan: [place] PPO / Product Type: *No Product type* /    Admitting Diagnosis: L CVA  Admit Date/Time:  02/14/2015  6:21 PM Admission Comments: No comment available   Primary Diagnosis:  Cerebral infarction due to thrombosis of basilar artery Principal Problem: Cerebral infarction due to thrombosis of basilar artery  Patient Active Problem List   Diagnosis Date Noted  . Dysphagia following cerebral infarction 02/15/2015  . Cerebral infarction due to thrombosis of basilar artery   . CKD stage 2 due to type 2 diabetes mellitus 02/12/2015  . Right hemiparesis   . CVA (cerebral infarction) 02/10/2015  . CKD (chronic kidney disease) 02/10/2015  . Type 2 diabetes mellitus with diabetic neuropathy 02/10/2015  . Neck pain   . Numbness and tingling 02/09/2015  . Back pain, acute 10/30/2012  . Hematuria of undiagnosed cause 10/30/2012  . Proteinuria 10/30/2012  . Left shoulder pain 07/17/2012  . Plantar fasciitis, bilateral 07/17/2012  . HEADACHE 01/07/2009  . Peripheral neuropathy 11/06/2007  . ERECTILE DYSFUNCTION, MILD 11/06/2007  . 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 Essential hypertension 07/08/2007  . EXTRINSIC ASTHMA, UNSPECIFIED 06/25/2007    Expected Discharge Date: Expected Discharge Date: 03/01/15  Team Members Present: Physician leading conference: Dr. Alysia Penna Nurse Present: Heather Roberts, RN PT Present: Raylene Everts, PT;Caroline Lacinda Axon, PT;Other (comment) Randa Ngo, PT) OT Present: Meriel Pica, Jules Schick, OT SLP Present: Windell Moulding, SLP PPS Coordinator present : Daiva Nakayama, RN, CRRN     Current Status/Progress Goal  Weekly Team Focus  Medical   severe swallow d/o, NGT, R HP  po feeds at d/c  swallow re eval   Bowel/Bladder   Pt continent of bowel and bladder         Swallow/Nutrition/ Hydration   WFL         ADL's   mod A overall  mod I  overall  ADL retraining, balance, RUE NMR, pt education   Mobility   Mod assist gait, transfers min to mod, stairs mod assist  mod I overall  Gait, balance, endurance, right strength/NMR, stairs   Communication             Safety/Cognition/ Behavioral Observations  NPO   mod I with least restrictive diet   trials of ice chips, pharyngeal strengthening exercises   Pain   no cpomplaints of pain         Skin   skin CDI          Rehab Goals Patient on target to meet rehab goals: Yes Rehab Goals Revised: none *See Care Plan and progress notes for long and short-term goals.  Barriers to Discharge: see above    Possible Resolutions to Barriers:  see above    Discharge Planning/Teaching Needs:  Pt plans to return to his home where he will be modified independent while his wife is at work.  Pt's goals are all modified independent.  Family can come in, if needed, for family education.   Team Discussion:  Pt is making slow progress on swallowing and MD questions that pt may need a PEG.  Pt has  good cognition, but will need ST to continue as his swallowing is pretty impaired.  OT feels pt has made a lot of progress even in one day.  He needs constant cues for trunk and standing balance, but is able to correct.  Pt is doing well with the walker with min-mod transfers and mod with gait.  His BERG is 13.   Revisions to Treatment Plan:  None   Continued Need for Acute Rehabilitation Level of Care: The patient requires daily medical management by a physician with specialized training in physical medicine and rehabilitation for the following conditions: Daily direction of a multidisciplinary physical rehabilitation program to ensure safe treatment while eliciting  the highest outcome that is of practical value to the patient.: Yes Daily medical management of patient stability for increased activity during participation in an intensive rehabilitation regime.: Yes Daily analysis of laboratory values and/or radiology reports with any subsequent need for medication adjustment of medical intervention for : Neurological problems;Other  [name], [name] 02/17/2015, 10:54 AM

## 2015-02-16 NOTE — Progress Notes (Signed)
Occupational Therapy Session Note  Patient Details  Name: Russell Austin MRN: [id number] Date of Birth: 03/15/1951  Today's Date: 02/16/2015 OT Individual Time: 0800-0900 OT Individual Time Calculation (min): 60 min    Short Term Goals: Week 1:  OT Short Term Goal 1 (Week 1): Pt will be able to don LB clothing with min A. OT Short Term Goal 2 (Week 1): Pt will complete a toilet transfer with S. OT Short Term Goal 3 (Week 1): Pt will be able to bathe with S. OT Short Term Goal 4 (Week 1): Pt will be able to use his R arm to wash his L arm with min A. OT Short Term Goal 5 (Week 1): Pt will be able to stand with steadying A as he washes his bottom in shower.  Skilled Therapeutic Interventions/Progress Updates:    Pt seen for BADL retraining of shower and dressing with a focus on balance, trunk control and hemi dressing techniques. Pt stood with min A and used RW into shower with mod A to stabilize balance. Once in shower, used grab bar to step in. Improved sitting balance today as he crossed his legs to be able to reach both feet with min support for balance at times. Pt needs mod A with standing balance in shower as he tends to lean back through his heels and through his hips.  He was able to wash his bottom with this support. Pt ambulated back to chair, improved standing balance with LB dressing as pt was focusing on engaging his core.  Improved dressing techniques with awareness of donning R side first.   Pt received 8 min of estim to wrist extensors/finger extensors at intensity 30 10sec on/ 5 off. Pt actively extended fingers with stimulation and was able to actively extend after tx over. Encouraged pt to continue to work on AROM.  Pt resting in w/c with half lap tray and call light in reach.    Therapy Documentation Precautions:  Precautions Precautions: Fall Precaution Comments: NPO  Pain: Pain Assessment Pain Assessment: No/denies pain ADL: ADL ADL Comments: refer to  FIM  See FIM for current functional status  Therapy/Group: Individual Therapy  [place] 02/16/2015, 10:45 AM

## 2015-02-16 NOTE — Progress Notes (Signed)
Social Work Patient ID: Russell Austin, male   DOB: 1951-07-19, 64 y.o.   MRN: [id number]   Met with pt to update him on team conference discussion.  Pt was disappointed that his d/c is scheduled for two weeks away.  CSW explained that this date can be moved if pt progresses faster than expected.  He expressed understanding, though he was slightly frustrated.  CSW told pt that his swallowing is what will need the most time to recover and he agreed.  CSW noticed that pt did not have speech therapy on his schedule for the day, so CSW notified ST, Windell Moulding, and therapy supervisor, Raylene Everts, and they were able to work it out for pt to have speech therapy today.  Pt will notify his wife of the date when she visits him after work. CSW will continue to follow and assist as needed.

## 2015-02-17 ENCOUNTER — Inpatient Hospital Stay (HOSPITAL_COMMUNITY): Payer: BC Managed Care – PPO

## 2015-02-17 ENCOUNTER — Inpatient Hospital Stay (HOSPITAL_COMMUNITY): Payer: BC Managed Care – PPO | Admitting: Occupational Therapy

## 2015-02-17 ENCOUNTER — Inpatient Hospital Stay (HOSPITAL_COMMUNITY): Payer: BC Managed Care – PPO | Admitting: Speech Pathology

## 2015-02-17 LAB — GLUCOSE, CAPILLARY
GLUCOSE-CAPILLARY: 158 mg/dL — AB (ref 65–99)
GLUCOSE-CAPILLARY: 163 mg/dL — AB (ref 65–99)
Glucose-Capillary: 109 mg/dL — ABNORMAL HIGH (ref 65–99)
Glucose-Capillary: 184 mg/dL — ABNORMAL HIGH (ref 65–99)
Glucose-Capillary: 209 mg/dL — ABNORMAL HIGH (ref 65–99)

## 2015-02-17 LAB — BASIC METABOLIC PANEL
Anion gap: 8 (ref 5–15)
BUN: 31 mg/dL — ABNORMAL HIGH (ref 6–20)
CHLORIDE: 101 mmol/L (ref 101–111)
CO2: 30 mmol/L (ref 22–32)
CREATININE: 1.17 mg/dL (ref 0.61–1.24)
Calcium: 8.6 mg/dL — ABNORMAL LOW (ref 8.9–10.3)
GFR calc Af Amer: >60 mL/min (ref >60)
GFR calc non Af Amer: >60 mL/min (ref >60)
Glucose, Bld: 191 mg/dL — ABNORMAL HIGH (ref 65–99)
POTASSIUM: 4.5 mmol/L (ref 3.5–5.1)
Sodium: 139 mmol/L (ref 135–145)

## 2015-02-17 MED ORDER — SORBITOL 70 % SOLN
45.0000 mL | Freq: Once | Status: AC
  Administered 2015-02-17: 45 mL
  Filled 2015-02-17: qty 60

## 2015-02-17 MED ORDER — SENNOSIDES 8.8 MG/5ML PO SYRP
15.0000 mL | ORAL_SOLUTION | Freq: Two times a day (BID) | ORAL | Status: DC
  Administered 2015-02-17 – 2015-03-02 (×16): 15 mL
  Filled 2015-02-17 (×31): qty 15

## 2015-02-17 MED ORDER — FREE WATER
300.0000 mL | Freq: Three times a day (TID) | Status: DC
  Administered 2015-02-17 – 2015-02-21 (×15): 300 mL

## 2015-02-17 NOTE — Progress Notes (Signed)
Nutrition Follow-up  DOCUMENTATION CODES:  Not applicable  INTERVENTION:  Tube feeding: Continue Glucerna 1.2 formula via NGT at goal rate of 80 ml/hr x 20 hours to provide 1920 kcal (100% of needs), 96 grams of protein, and 1296 ml of free water.  Continue free water flushes.  RD to continue to monitor.   NUTRITION DIAGNOSIS:  Inadequate oral intake related to inability to eat as evidenced by NPO status; ongoing  GOAL:  Patient will meet greater than or equal to 90% of their needs; met  MONITOR:  TF tolerance, Weight trends, Labs, I & O's  REASON FOR ASSESSMENT:  Consult Enteral/tube feeding initiation and management  ASSESSMENT: Pt with history of DM type 2 with retinopathy, HTN, glaucoma, who was admitted on 02/09/15 with reports of heaviness of RUE/RLE with intermittent numbness, pins and needles. 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 MBS done revealing severe oro-pharyngeal dysphagia. Therefore NPO was recommended and NGT was placed for nutritional support.  Pt has been tolerating his tube feeds well with no other difficulties per RN. Will continue to current orders.  Noted pt has not had a BM since 6/8. RN aware and meds have been given. RD to continue to monitor.  Labs and medications reviewed.  Height:  Ht Readings from Last 1 Encounters:  02/09/15 5' 9"  (1.753 m)    Weight:  Wt Readings from Last 1 Encounters:  02/17/15 161 lb 2.5 oz (73.1 kg)    Ideal Body Weight:  72.7 kg  Wt Readings from Last 10 Encounters:  02/17/15 161 lb 2.5 oz (73.1 kg)  02/14/15 155 lb 13.8 oz (70.7 kg)  12/20/14 182 lb (82.555 kg)  03/18/14 184 lb (83.462 kg)  12/24/13 184 lb (83.462 kg)  05/13/13 183 lb (83.008 kg)  10/30/12 182 lb (82.555 kg)  07/17/12 185 lb (83.915 kg)  03/26/12 185 lb (83.915 kg)  07/02/11 184 lb (83.462 kg)    BMI:  Body mass index is 23.79  kg/(m^2).  Estimated Nutritional Needs:  Kcal:  1900-2100  Protein:  90-100 grams  Fluid:  1.9 - 2.1 L/day  Skin:  Reviewed, no issues  Diet Order:  Diet NPO time specified  EDUCATION NEEDS:  No education needs identified at this time   Intake/Output Summary (Last 24 hours) at 02/17/15 1047 Last data filed at 02/17/15 0300  Gross per 24 hour  Intake    500 ml  Output    800 ml  Net   -300 ml    Last BM:  6/8- BM Meds given  Corrin Parker, MS, RD, LDN Pager # [phone] After hours/ weekend pager # [phone]

## 2015-02-17 NOTE — Progress Notes (Signed)
Speech Language Pathology Daily Session Note  Patient Details  Name: Russell Austin MRN: [id number] Date of Birth: 18-Jun-1951  Today's Date: 02/17/2015 SLP Individual Time: 1001-1028 SLP Individual Time Calculation (min): 27 min  Short Term Goals: Week 1: SLP Short Term Goal 1 (Week 1): Pt will demonstrate minimal overt s/s aspiration with po trials with supervision verbal cues to determine readines to repeat MBS SLP Short Term Goal 2 (Week 1): Pt will perform pharyngeal strengthening exercises with supervision verbal and visual cues. SLP Short Term Goal 3 (Week 1): Pt will participate in repeat MBS to objectively assess swallow function and safety, and to determne readiness to resume po intake.  Skilled Therapeutic Interventions:  Pt was seen for skilled ST targeting dysphagia goals.  Upon arrival, pt was seated upright in wheelchair,awake, alert, and agreeable to participate in ST.  SLP facilitated the session with trials of ice chips following thorough oral care to continue working towards initiation of PO diet.  Pt demonstrated immediate, strong reflexive cough on 2 out of 15 trials.  Pt returned demonstration of targeted pharyngeal strengthening exercises with supervision cues.  Pt was left upright in wheelchair with all needs left within reach.  Continue per current plan of care.    FIM:  Comprehension Comprehension Mode: Auditory Comprehension: 5-Understands complex 90% of the time/Cues < 10% of the time Expression Expression Mode: Verbal Expression: 5-Expresses complex 90% of the time/cues < 10% of the time Social Interaction Social Interaction: 6-Interacts appropriately with others with medication or extra time (anti-anxiety, antidepressant). Problem Solving Problem Solving: 5-Solves basic 90% of the time/requires cueing < 10% of the time Memory Memory: 6-More than reasonable amt of time FIM - Eating Eating Activity: 5: Supervision/cues (with trials )  Pain Pain  Assessment Pain Assessment: No/denies pain  Therapy/Group: Individual Therapy  Viyaan Champine, Selinda Orion 02/17/2015, 4:46 PM

## 2015-02-17 NOTE — Progress Notes (Signed)
Physical Therapy Session Note  Patient Details  Name: Russell Austin MRN: [id number] Date of Birth: March 26, 1951  Today's Date: 02/17/2015 PT Individual Time: 0910-1005 PT Individual Time Calculation (min): 55 min   Short Term Goals: Week 1:  PT Short Term Goal 1 (Week 1): Patient will propel wheelchair independent level controlled surfaces including set up for transfers. PT Short Term Goal 2 (Week 1): Patient will ambulate 100' level tile and carpet surfaces with LRAD min assist PT Short Term Goal 3 (Week 1): Patient will negotiate 12 stairs with min assist with railing  PT Short Term Goal 4 (Week 1): Patient will demonstrate decreased fall risk with Berg score at least 22/56.  Skilled Therapeutic Interventions/Progress Updates:   W/c>< mat squat pivot transfer with min/mod assist for balance, head/hips relationship, slowing down for safety.  neuromuscular re-education via mirror feedback, forced use, demo, VCs, manual cues for biomechanics of sit>< stand, RLE stance stability in standing biased to R, RLE swing phase components in sitting, terminal hip extension in tall kneeling with bil UE support fading to no UE support on stool in front ; gait with mod assist for wt shifting and balance,x 65' with RW with R hand splint focusing on upright trunk, forward gaze, R foot placement without looking at it, wider BOS.  Pt tends to move ballistically, overcompensating with L side and off- setting balance during sit> stand.  Pt returned to room by PT; all needs left within reach.    Therapy Documentation Precautions:  Precautions Precautions: Fall Precaution Comments: NPO Restrictions Weight Bearing Restrictions: No   Pain: Pain Assessment Pain Assessment: No/denies pain    See FIM for current functional status  Therapy/Group: Individual Therapy  Gregorey Nabor 02/17/2015, 5:01 PM

## 2015-02-17 NOTE — Plan of Care (Signed)
Problem: RH BOWEL ELIMINATION Goal: RH STG MANAGE BOWEL WITH ASSISTANCE STG Manage Bowel with minimal assistance.  Outcome: Not Progressing Last bowel movement 6/8 ; requests laxative after therapy today

## 2015-02-17 NOTE — Progress Notes (Signed)
Occupational Therapy Session Note  Patient Details  Name: Russell Austin MRN: [id number] Date of Birth: 03/16/1951  Today's Date: 02/17/2015 OT Individual Time: 1300-1400 OT Individual Time Calculation (min): 60 min    Short Term Goals: Week 1:  OT Short Term Goal 1 (Week 1): Pt will be able to don LB clothing with min A. OT Short Term Goal 2 (Week 1): Pt will complete a toilet transfer with S. OT Short Term Goal 3 (Week 1): Pt will be able to bathe with S. OT Short Term Goal 4 (Week 1): Pt will be able to use his R arm to wash his L arm with min A. OT Short Term Goal 5 (Week 1): Pt will be able to stand with steadying A as he washes his bottom in shower.  Skilled Therapeutic Interventions/Progress Updates:    Pt seen for 1:1 OT session with a focus on dynamic standing balance, L NMR, safety awareness, and activity tolerance. Pt received seated in w/c agreeable to therapy. Pt completed sit<>stand with min A to high-low table. Pt engaged in activity involving reaching out of BOS to facilitate weight through RUE. Pt engaged in activity in standing for apprx 2 min 3x requiring steadying A. Pt then completed bilateral UE ROM exercises of shoulder flexion and elbow flexion/extension using therapy ball. Pt required min assist with managing RUE on side of ball and able to achieve 80deg of flexion. Pt completed 3 sets of 10 for each exercises. Pt then engaged in dynamic standing activity for 10 min involving RUE grasping and tossing bean bag. Pt demonstrated shoulder flexion against gravity to apprx 70-80deg. Pt demonstrated improved ability to grasp bean bag as task progressed. PROM of RUE provided. Pt demonstrated improved AROM of digits at end of session. Pt left seated in w/c with RN present.   Therapy Documentation Precautions:  Precautions Precautions: Fall Precaution Comments: NPO Restrictions Weight Bearing Restrictions: No General:   Vital Signs: Therapy Vitals Temp: 98 F (36.7  C) Temp Source: Oral Pulse Rate: 63 Resp: 18 BP: 132/71 mmHg Patient Position (if appropriate): Sitting Oxygen Therapy SpO2: 100 % O2 Device: Not Delivered Pain:   ADL: ADL ADL Comments: refer to FIM Exercises:   Other Treatments:    See FIM for current functional status  Therapy/Group: Individual Therapy  Dorann Ou 02/17/2015, 4:05 PM

## 2015-02-17 NOTE — Plan of Care (Signed)
Problem: RH BOWEL ELIMINATION Goal: RH STG MANAGE BOWEL WITH ASSISTANCE STG Manage Bowel with minimal assistance.  Outcome: Progressing Patient passing out gas and colonoscopy last Wednesday ; laxatives on board

## 2015-02-17 NOTE — IPOC Note (Signed)
Overall Plan of Care [facility]) Patient Details Name: Russell Austin MRN: [id number] DOB: [birth year]  Admitting Diagnosis: L CVA  Hospital Problems: Principal Problem:   Cerebral infarction due to thrombosis of basilar artery Active Problems:   Type 2 diabetes mellitus with diabetic neuropathy   CKD stage 2 due to type 2 diabetes mellitus   Right hemiparesis   Dysphagia following cerebral infarction     Functional Problem List: Nursing Bowel, Endurance, Medication Management, Nutrition, Pain, Perception, Safety, Sensory  PT Balance, Endurance, Motor, Safety  OT Balance, Motor, Perception  SLP Nutrition, Sensory, Motor  TR Endurance, Motor, Safety       Basic ADL's: OT Grooming, Bathing, Dressing, Toileting     Advanced  ADL's: OT       Transfers: PT Bed Mobility, Bed to Chair, Car, Furniture, Floor  OT Tub/Shower, Agricultural engineer: PT Ambulation, Stairs     Additional Impairments: OT Fuctional Use of Upper Extremity  SLP Swallowing      TR      Anticipated Outcomes Item Anticipated Outcome  Self Feeding mod I  Swallowing  Tolerance of least restrictive po diet without overt s/s aspiration.   Basic self-care  mod I  Toileting  mod I   Bathroom Transfers mod I  Bowel/Bladder  continent of bowel and bladder with min assidt  Transfers  mod I  Locomotion  mod I  Communication     Cognition     Pain  pain less than or equal to 4 on a scakle of 0-10  Safety/Judgment  Safe from injury with min assist   Therapy Plan: PT Intensity: Minimum of 1-2 x/day ,45 to 90 minutes PT Frequency: 5 out of 7 days PT Duration Estimated Length of Stay: 14-16 days OT Intensity: Minimum of 1-2 x/day, 45 to 90 minutes OT Frequency: 5 out of 7 days OT Duration/Estimated Length of Stay: 14-16 days SLP Intensity: Minumum of 1-2 x/day, 30 to 90 minutes SLP Frequency: 3 to 5 out of 7 days SLP Duration/Estimated Length of Stay: 14-16 days       Team  Interventions: Nursing Interventions Patient/Family Education, Bowel Management, Pain Management, Medication Management, Disease Management/Prevention, Dysphagia/Aspiration Precaution Training  PT interventions Ambulation/gait training, Balance/vestibular training, Discharge planning, Cognitive remediation/compensation, DME/adaptive equipment instruction, Functional mobility training, Therapeutic Activities, UE/LE Strength taining/ROM, Splinting/orthotics, Wheelchair propulsion/positioning, UE/LE Coordination activities, Therapeutic Exercise, Stair training, Patient/family education, Neuromuscular re-education  OT Interventions Balance/vestibular training, Discharge planning, DME/adaptive equipment instruction, Functional mobility training, Neuromuscular re-education, Functional electrical stimulation, Patient/family education, Self Care/advanced ADL retraining, Therapeutic Activities, Therapeutic Exercise, UE/LE Strength taining/ROM, UE/LE Coordination activities, Visual/perceptual remediation/compensation  SLP Interventions Patient/family education, Multimodal communication approach, Neuromuscular electrical stimulation, Dysphagia/aspiration precaution training, Therapeutic Exercise  TR Interventions Adaptive equipment instruction, 1:1 session, Training and development officer, Functional mobility training, Community reintegration, Cognitive remediation/compensation, Barrister's clerk education, Therapeutic activities, Recreation/leisure participation, Therapeutic exercise, UE/LE Coordination activities, Visual/perceptual remediation/compensation  SW/CM Interventions Discharge Planning, Barrister's clerk, Patient/Family Education    Team Discharge Planning: Destination: PT-Home ,OT- Home , SLP-Home Projected Follow-up: PT-Outpatient PT, OT-  Outpatient OT, SLP-Home Health SLP, Outpatient SLP Projected Equipment Needs: PT-To be determined, OT- Tub/shower seat, SLP-None recommended by SLP Equipment Details:  PT-likely quad cane, possible AFO, OT-  Patient/family involved in discharge planning: PT- Patient,  OT-Patient, SLP-Patient, Family member/caregiver  MD ELOS: 15-18d Medical Rehab Prognosis:  Good Assessment: 64 y.o. male with history of DM type 2 with retinopathy, HTN, glaucoma, who was admitted on 02/09/15 with reports of heaviness of RUE/RLE  with intermittent numbness, pins and needles since waking that am. He underwent colonoscopy that am and after procedure was noted to have difficulty walking as well as difficulty swallowing while in ED. 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 2D echo showed EF 60% with no wall abnormality. Carotid dopplers were negative for significant ICA stenosis. MBS done revealing severe oro-pharyngeal dysphagia. Therefore NPO was recommended and NGT was placed for nutritional support. Dr Erlinda Hong evaluated patient and recommended ASA for left medullary infarct most likely due to small vessel disease   Now requiring 24/7 Rehab RN,MD, as well as CIR level PT, OT and SLP.  Treatment team will focus on ADLs and mobility with goals set at Mod I  See Team Conference Notes for weekly updates to the plan of care

## 2015-02-17 NOTE — Progress Notes (Signed)
Occupational Therapy Session Note  Patient Details  Name: Russell Austin MRN: [id number] Date of Birth: [birth year]  Today's Date: 02/17/2015 OT Individual Time: 0800-0900 OT Individual Time Calculation (min): 60 min    Short Term Goals: Week 1:  OT Short Term Goal 1 (Week 1): Pt will be able to don LB clothing with min A. OT Short Term Goal 2 (Week 1): Pt will complete a toilet transfer with S. OT Short Term Goal 3 (Week 1): Pt will be able to bathe with S. OT Short Term Goal 4 (Week 1): Pt will be able to use his R arm to wash his L arm with min A. OT Short Term Goal 5 (Week 1): Pt will be able to stand with steadying A as he washes his bottom in shower.  Skilled Therapeutic Interventions/Progress Updates:    Pt seen for BADL of shower/dressing with focus on dynamic balance/trunk control/postural awareness and use of RUE. Pt worked on sit to stands from w/c by engaging core and then ambulated to seat in bathroom to undress and then to shower. Pt requiring cuing to remove R hand from walker prior to sitting. Good active use of lifting his R arm for bathing and washing. Continues to be challenged with standing balance in shower due to slopped floor and difficulty maintaining wt over mid foot.  Needs mod A to balance at times.  Improved R awareness with dressing, donned shirt without A. Pt transported to gym and transferred to mat with RW. Worked on R shoulder/grasp arom with grasping and placing cones. Pt tends to hike his R shoulder, needs facilitation through scapula for smooth protraction vs elevation. Gentle AROM of R shoulder with forearm on small physioball and rolling forward and back and out to side. Pt c/o pain in lower traps, kinesiotape applied over tight muscle. Pt resting on mat for next therapy session.  Therapy Documentation Precautions:  Precautions Precautions: Fall Precaution Comments: NPO Restrictions Weight Bearing Restrictions: No   Pain: Pain Assessment Pain  Assessment: No/denies pain ADL: ADL ADL Comments: refer to FIM  See FIM for current functional status  Therapy/Group: Individual Therapy  [name] 02/17/2015, 10:27 AM

## 2015-02-17 NOTE — Progress Notes (Signed)
Subjective/Complaints: Per OT requiring Min A for dressing Discussed swallowing which may present a longer time course for recovery, pt wants to avoid G tube ROS- No CP or SOB Objective: Vital Signs: Blood pressure 132/64, pulse 77, temperature 98 F (36.7 C), temperature source Oral, resp. rate 18, weight 73.1 kg (161 lb 2.5 oz), SpO2 98 %. No results found. Results for orders placed or performed during the hospital encounter of 02/14/15 (from the past 72 hour(s))  Glucose, capillary     Status: Abnormal   Collection Time: 02/15/15 12:19 AM  Result Value Ref Range   Glucose-Capillary 194 (H) 65 - 99 mg/dL   Comment 1 Notify RN   Glucose, capillary     Status: Abnormal   Collection Time: 02/15/15  6:07 AM  Result Value Ref Range   Glucose-Capillary 149 (H) 65 - 99 mg/dL   Comment 1 Notify RN   CBC WITH DIFFERENTIAL     Status: Abnormal   Collection Time: 02/15/15  7:05 AM  Result Value Ref Range   WBC 5.5 4.0 - 10.5 K/uL   RBC 4.04 (L) 4.22 - 5.81 MIL/uL   Hemoglobin 11.9 (L) 13.0 - 17.0 g/dL   HCT 36.2 (L) 39.0 - 52.0 %   MCV 89.6 78.0 - 100.0 fL   MCH 29.5 26.0 - 34.0 pg   MCHC 32.9 30.0 - 36.0 g/dL   RDW 12.6 11.5 - 15.5 %   Platelets 273 150 - 400 K/uL   Neutrophils Relative % 61 43 - 77 %   Neutro Abs 3.4 1.7 - 7.7 K/uL   Lymphocytes Relative 28 12 - 46 %   Lymphs Abs 1.5 0.7 - 4.0 K/uL   Monocytes Relative 8 3 - 12 %   Monocytes Absolute 0.4 0.1 - 1.0 K/uL   Eosinophils Relative 2 0 - 5 %   Eosinophils Absolute 0.1 0.0 - 0.7 K/uL   Basophils Relative 1 0 - 1 %   Basophils Absolute 0.0 0.0 - 0.1 K/uL  Comprehensive metabolic panel     Status: Abnormal   Collection Time: 02/15/15  7:05 AM  Result Value Ref Range   Sodium 144 135 - 145 mmol/L   Potassium 4.3 3.5 - 5.1 mmol/L   Chloride 107 101 - 111 mmol/L   CO2 29 22 - 32 mmol/L   Glucose, Bld 195 (H) 65 - 99 mg/dL   BUN 35 (H) 6 - 20 mg/dL   Creatinine, Ser 1.25 (H) 0.61 - 1.24 mg/dL   Calcium 9.1 8.9 -  10.3 mg/dL   Total Protein 6.4 (L) 6.5 - 8.1 g/dL   Albumin 3.3 (L) 3.5 - 5.0 g/dL   AST 22 15 - 41 U/L   ALT 18 17 - 63 U/L   Alkaline Phosphatase 68 38 - 126 U/L   Total Bilirubin 1.4 (H) 0.3 - 1.2 mg/dL   GFR calc non Af Amer 60 (L) >60 mL/min   GFR calc Af Amer >60 >60 mL/min    Comment: (NOTE) The eGFR has been calculated using the CKD EPI equation. This calculation has not been validated in all clinical situations. eGFR's persistently <60 mL/min signify possible Chronic Kidney Disease.    Anion gap 8 5 - 15  Glucose, capillary     Status: Abnormal   Collection Time: 02/15/15 11:53 AM  Result Value Ref Range   Glucose-Capillary 154 (H) 65 - 99 mg/dL  Glucose, capillary     Status: Abnormal   Collection Time: 02/15/15  5:59  PM  Result Value Ref Range   Glucose-Capillary 196 (H) 65 - 99 mg/dL  Glucose, capillary     Status: Abnormal   Collection Time: 02/15/15  9:50 PM  Result Value Ref Range   Glucose-Capillary 200 (H) 65 - 99 mg/dL  Glucose, capillary     Status: Abnormal   Collection Time: 02/16/15 12:24 AM  Result Value Ref Range   Glucose-Capillary 185 (H) 65 - 99 mg/dL  Glucose, capillary     Status: Abnormal   Collection Time: 02/16/15  5:53 AM  Result Value Ref Range   Glucose-Capillary 233 (H) 65 - 99 mg/dL  Glucose, capillary     Status: Abnormal   Collection Time: 02/16/15 12:02 PM  Result Value Ref Range   Glucose-Capillary 221 (H) 65 - 99 mg/dL  Glucose, capillary     Status: Abnormal   Collection Time: 02/16/15  6:15 PM  Result Value Ref Range   Glucose-Capillary 186 (H) 65 - 99 mg/dL  Glucose, capillary     Status: Abnormal   Collection Time: 02/17/15 12:32 AM  Result Value Ref Range   Glucose-Capillary 184 (H) 65 - 99 mg/dL  Glucose, capillary     Status: Abnormal   Collection Time: 02/17/15  5:52 AM  Result Value Ref Range   Glucose-Capillary 163 (H) 65 - 99 mg/dL  Basic metabolic panel     Status: Abnormal   Collection Time: 02/17/15  6:47 AM   Result Value Ref Range   Sodium 139 135 - 145 mmol/L   Potassium 4.5 3.5 - 5.1 mmol/L   Chloride 101 101 - 111 mmol/L   CO2 30 22 - 32 mmol/L   Glucose, Bld 191 (H) 65 - 99 mg/dL   BUN 31 (H) 6 - 20 mg/dL   Creatinine, Ser 1.17 0.61 - 1.24 mg/dL   Calcium 8.6 (L) 8.9 - 10.3 mg/dL   GFR calc non Af Amer >60 >60 mL/min   GFR calc Af Amer >60 >60 mL/min    Comment: (NOTE) The eGFR has been calculated using the CKD EPI equation. This calculation has not been validated in all clinical situations. eGFR's persistently <60 mL/min signify possible Chronic Kidney Disease.    Anion gap 8 5 - 15     HEENT: normal Cardio: RRR and no murmur Resp: CTA B/L and unlabored GI: BS positive and NT, ND Extremity:  Pulses positive and No Edema Skin:   Intact Neuro: Alert/Oriented Right upper extremity 3 minus at the deltoid, biceps, triceps, grip 3 minus at the hip flexor on the right 4 minus knee extensor   Assessment/Plan: 1. 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 Physiatrist is providing close team supervision and 24 hour management of active medical problems listed below. Physiatrist and rehab team continue to assess barriers to discharge/monitor patient progress toward functional and medical goals. BUN>30 will increase H2O flushes FIM: FIM - Bathing Bathing Steps Patient Completed: Chest, Right Arm, Abdomen, Front perineal area, Right upper leg, Left upper leg, Left lower leg (including foot), Buttocks, Right lower leg (including foot) Bathing: 4: Min-Patient completes 8-9 56f10 parts or 75+ percent  FIM - Upper Body Dressing/Undressing Upper body dressing/undressing steps patient completed: Thread/unthread left sleeve of pullover shirt/dress, Put head through opening of pull over shirt/dress Upper body dressing/undressing: 3: Mod-Patient  completed 50-74% of tasks FIM - Lower Body Dressing/Undressing Lower body dressing/undressing steps  patient completed: Pull underwear up/down, Pull pants up/down, Thread/unthread left underwear leg, Thread/unthread right underwear leg, Thread/unthread left pants leg Lower body dressing/undressing: 2: Max-Patient completed 25-49% of tasks  FIM - Toileting Toileting: 0: Activity did not occur     FIM - Control and instrumentation engineer Devices: Arm rests Bed/Chair Transfer: 3: Chair or W/C > Bed: Mod A (lift or lower assist), 3: Bed > Chair or W/C: Mod A (lift or lower assist)  FIM - Locomotion: Wheelchair Distance: 150 Locomotion: Wheelchair: 5: Travels 150 ft or more: maneuvers on rugs and over door sills with supervision, cueing or coaxing FIM - Locomotion: Ambulation Locomotion: Ambulation Assistive Devices: Other (comment) (weighted grocery cart) Ambulation/Gait Assistance: 2: Max assist Locomotion: Ambulation: 1: Travels less than 50 ft with maximal assistance (Pt: 25 - 49%)  Comprehension Comprehension Mode: Auditory Comprehension: 5-Understands complex 90% of the time/Cues < 10% of the time  Expression Expression Mode: Verbal Expression: 5-Expresses complex 90% of the time/cues < 10% of the time  Social Interaction Social Interaction: 6-Interacts appropriately with others with medication or extra time (anti-anxiety, antidepressant).  Problem Solving Problem Solving: 5-Solves basic 90% of the time/requires cueing < 10% of the time  Memory Memory: 6-More than reasonable amt of time  Medical Problem List and Plan: 1. 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  DVT Prophylaxis/Anticoagulation: Pharmaceutical: Lovenox 3. Pain Management: N/A 4. Mood: LCSW to follow for evaluation and support. Has good family support.   5. Neuropsych: This patient is capable of making decisions on his own behalf. 6.  Skin/Wound Care: Routine pressure relief measures.   7. Fluids/Electrolytes/Nutrition: Continue tube feeds for nutritional support. Add water flushes. Check lytes in am.   8. HTN: Monitor BP every 8 hours. Continue Prinivil daily.   9. Severe dysphagia: NPO. Will have dietician assist with nutritional needs as complaining of hunger--had colonoscopy last Wed.       10. DM type 2: Hgb A1C-7.6. Was on metformin 1000 mg bid with Glucotrol XL 10 mg daily. Now on Lantus due to continuous tube feeds.   11. CKD: Baseline Cr- 1.3.  Creat at baseline Continue to hold HCTZ for now unlesss BP rises. 12. Dyslipidemia: continue Lipitor.     LOS (Days) 3 A FACE TO FACE EVALUATION WAS PERFORMED  KIRSTEINS,ANDREW E 02/17/2015, 8:59 AM

## 2015-02-18 ENCOUNTER — Inpatient Hospital Stay (HOSPITAL_COMMUNITY): Payer: BC Managed Care – PPO

## 2015-02-18 ENCOUNTER — Inpatient Hospital Stay (HOSPITAL_COMMUNITY): Payer: BC Managed Care – PPO | Admitting: Occupational Therapy

## 2015-02-18 ENCOUNTER — Inpatient Hospital Stay (HOSPITAL_COMMUNITY): Payer: BC Managed Care – PPO | Admitting: Rehabilitation

## 2015-02-18 LAB — GLUCOSE, CAPILLARY
GLUCOSE-CAPILLARY: 86 mg/dL (ref 65–99)
Glucose-Capillary: 144 mg/dL — ABNORMAL HIGH (ref 65–99)
Glucose-Capillary: 162 mg/dL — ABNORMAL HIGH (ref 65–99)

## 2015-02-18 NOTE — Progress Notes (Signed)
Physical Therapy Session Note  Patient Details  Name: Russell [place] MRN: [id number] Date of Birth: 05/13/1951  Today's Date: 02/18/2015 PT Individual Time: [id number] PT Individual Time Calculation (min): 45 min   Short Term Goals: Week 1:  PT Short Term Goal 1 (Week 1): Patient will propel wheelchair independent level controlled surfaces including set up for transfers. PT Short Term Goal 2 (Week 1): Patient will ambulate 100' level tile and carpet surfaces with LRAD min assist PT Short Term Goal 3 (Week 1): Patient will negotiate 12 stairs with min assist with railing  PT Short Term Goal 4 (Week 1): Patient will demonstrate decreased fall risk with Berg score at least 22/56.  Skilled Therapeutic Interventions/Progress Updates:  Patient sitting in wheelchair upon entering room. PT asked RN to unhook tube feeding. Patient propelled wheelchair using left extremities 150 feet with supervision. Patient ambulated with rolling walker and right hand orthosis 85 feet with mod assist for balance and facilitation. Patient has flexed trunk, narrow base of support, decreased hip and knee flexion on right during swing, decreased dorsiflexion during swing, and shorter stride on left due to decreased stance time on right. NMR for right LE working in supine for heel slides, hip abduction/adduction, hip flexion with right LE extended over side of mat, and single leg terminal bridging with left LE on stool next to mat working on slow movements and control. PT performed stretching to tight hip flexors and quads on right. Patient performed standing tap ups onto 3 inch step working on right hip and knee flexion and ankle dorsiflexion for initiation of swing phase during gait. Patient ambulated with rolling walker, hand orthosis, and min to occasional mod assist with better control and more flexion during right LE swing. Patient propelled wheelchair 150 feet back to room with supervision and was left in wheelchair in  room with all items in reach.  Therapy Documentation Precautions:  Precautions Precautions: Fall Precaution Comments: NPO Restrictions Weight Bearing Restrictions: No  Pain: Pain Assessment Pain Assessment: No/denies pain  Locomotion : Ambulation Ambulation/Gait Assistance: 3: Mod assist Wheelchair Mobility Distance: 150    See FIM for current functional status  Therapy/Group: Individual Therapy  Sanjuana Letters 02/18/2015, 12:14 PM

## 2015-02-18 NOTE — Progress Notes (Signed)
Subjective/Complaints: Patient trying to use right upper extremity more. Denies any swallowing problems with saliva. He states that he can copy things up quite easily. ROS- No CP or SOB, no abdominal pain nausea or vomiting Objective: Vital Signs: Blood pressure 128/64, pulse 71, temperature 98.2 F (36.8 C), temperature source Oral, resp. rate 18, weight 72.8 kg (160 lb 7.9 oz), SpO2 100 %. No results found. Results for orders placed or performed during the hospital encounter of 02/14/15 (from the past 72 hour(s))  Glucose, capillary     Status: Abnormal   Collection Time: 02/15/15 11:53 AM  Result Value Ref Range   Glucose-Capillary 154 (H) 65 - 99 mg/dL  Glucose, capillary     Status: Abnormal   Collection Time: 02/15/15  5:59 PM  Result Value Ref Range   Glucose-Capillary 196 (H) 65 - 99 mg/dL  Glucose, capillary     Status: Abnormal   Collection Time: 02/15/15  9:50 PM  Result Value Ref Range   Glucose-Capillary 200 (H) 65 - 99 mg/dL  Glucose, capillary     Status: Abnormal   Collection Time: 02/16/15 12:24 AM  Result Value Ref Range   Glucose-Capillary 185 (H) 65 - 99 mg/dL  Glucose, capillary     Status: Abnormal   Collection Time: 02/16/15  5:53 AM  Result Value Ref Range   Glucose-Capillary 233 (H) 65 - 99 mg/dL  Glucose, capillary     Status: Abnormal   Collection Time: 02/16/15 12:02 PM  Result Value Ref Range   Glucose-Capillary 221 (H) 65 - 99 mg/dL  Glucose, capillary     Status: Abnormal   Collection Time: 02/16/15  6:15 PM  Result Value Ref Range   Glucose-Capillary 186 (H) 65 - 99 mg/dL  Glucose, capillary     Status: Abnormal   Collection Time: 02/17/15 12:32 AM  Result Value Ref Range   Glucose-Capillary 184 (H) 65 - 99 mg/dL  Glucose, capillary     Status: Abnormal   Collection Time: 02/17/15  5:52 AM  Result Value Ref Range   Glucose-Capillary 163 (H) 65 - 99 mg/dL  Basic metabolic panel     Status: Abnormal   Collection Time: 02/17/15  6:47 AM   Result Value Ref Range   Sodium 139 135 - 145 mmol/L   Potassium 4.5 3.5 - 5.1 mmol/L   Chloride 101 101 - 111 mmol/L   CO2 30 22 - 32 mmol/L   Glucose, Bld 191 (H) 65 - 99 mg/dL   BUN 31 (H) 6 - 20 mg/dL   Creatinine, Ser 1.17 0.61 - 1.24 mg/dL   Calcium 8.6 (L) 8.9 - 10.3 mg/dL   GFR calc non Af Amer >60 >60 mL/min   GFR calc Af Amer >60 >60 mL/min    Comment: (NOTE) The eGFR has been calculated using the CKD EPI equation. This calculation has not been validated in all clinical situations. eGFR's persistently <60 mL/min signify possible Chronic Kidney Disease.    Anion gap 8 5 - 15  Glucose, capillary     Status: Abnormal   Collection Time: 02/17/15 11:38 AM  Result Value Ref Range   Glucose-Capillary 109 (H) 65 - 99 mg/dL  Glucose, capillary     Status: Abnormal   Collection Time: 02/17/15  6:01 PM  Result Value Ref Range   Glucose-Capillary 209 (H) 65 - 99 mg/dL  Glucose, capillary     Status: Abnormal   Collection Time: 02/17/15 11:44 PM  Result Value Ref Range  Glucose-Capillary 158 (H) 65 - 99 mg/dL  Glucose, capillary     Status: Abnormal   Collection Time: 02/18/15  6:38 AM  Result Value Ref Range   Glucose-Capillary 144 (H) 65 - 99 mg/dL     HEENT: normal Cardio: RRR and no murmur Resp: CTA B/L and unlabored GI: BS positive and NT, ND Extremity:  Pulses positive and No Edema Skin:   Intact Neuro: Alert/Oriented Right upper extremity 3 minus at the deltoid, biceps, triceps, grip 3 minus at the hip flexor on the right 4 minus knee extensor   Assessment/Plan: 1. 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 Physiatrist is providing close team supervision and 24 hour management of active medical problems listed below. Physiatrist and rehab team continue to assess barriers to discharge/monitor patient progress toward  functional and medical goals.  FIM: FIM - Bathing Bathing Steps Patient Completed: Chest, Right Arm, Abdomen, Front perineal area, Right upper leg, Left upper leg, Left lower leg (including foot), Buttocks, Right lower leg (including foot) Bathing: 4: Min-Patient completes 8-9 62f10 parts or 75+ percent  FIM - Upper Body Dressing/Undressing Upper body dressing/undressing steps patient completed: Thread/unthread left sleeve of pullover shirt/dress, Put head through opening of pull over shirt/dress, Thread/unthread right sleeve of pullover shirt/dresss, Pull shirt over trunk Upper body dressing/undressing: 5: Supervision: Safety issues/verbal cues FIM - Lower Body Dressing/Undressing Lower body dressing/undressing steps patient completed: Pull underwear up/down, Pull pants up/down, Thread/unthread left underwear leg, Thread/unthread right underwear leg, Thread/unthread left pants leg, Thread/unthread right pants leg, Don/Doff left sock, Don/Doff left shoe Lower body dressing/undressing: 3: Mod-Patient completed 50-74% of tasks  FIM - Toileting Toileting: 0: Activity did not occur     FIM - BControl and instrumentation engineerDevices: Arm rests Bed/Chair Transfer: 3: Chair or W/C > Bed: Mod A (lift or lower assist), 3: Bed > Chair or W/C: Mod A (lift or lower assist)  FIM - Locomotion: Wheelchair Distance: 150 Locomotion: Wheelchair: 0: Activity did not occur FIM - Locomotion: Ambulation Locomotion: Ambulation Assistive Devices: WAdministrator Orthosis Ambulation/Gait Assistance: 3: Mod assist Locomotion: Ambulation: 2: Travels 50 - 149 ft with moderate assistance (Pt: 50 - 74%)  Comprehension Comprehension Mode: Auditory Comprehension: 5-Understands basic 90% of the time/requires cueing < 10% of the time  Expression Expression Mode: Verbal Expression: 5-Expresses basic 90% of the time/requires cueing < 10% of the time.  Social Interaction Social Interaction:  6-Interacts appropriately with others with medication or extra time (anti-anxiety, antidepressant).  Problem Solving Problem Solving: 5-Solves basic 90% of the time/requires cueing < 10% of the time  Memory Memory: 6-More than reasonable amt of time  Medical Problem List and Plan: 1. 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  DVT Prophylaxis/Anticoagulation: Pharmaceutical: Lovenox 3. Pain Management: N/A 4. Mood: LCSW to follow for evaluation and support. Has good family support.   5. Neuropsych: This patient is capable of making decisions on his own behalf. 6. Skin/Wound Care: Routine pressure relief measures.   7. Fluids/Electrolytes/Nutrition: Continue tube feeds for nutritional support. Add water flushes. Check lytes in am.   8. HTN: Monitor BP every 8 hours. Continue Prinivil daily.   9. Severe dysphagia: NPO. Will have dietician assist with nutritional needs as complaining of hunger--had colonoscopy last Wed.       10. DM type 2: Hgb A1C-7.6.fair control Was  on metformin 1000 mg bid with Glucotrol XL 10 mg daily. Now on Lantus due to continuous tube feeds.   11. CKD: Baseline Cr- 1.3.  Creat at baseline Continue to hold HCTZ for now unlesss BP rises. 12. Dyslipidemia: continue Lipitor.     LOS (Days) 4 A FACE TO FACE EVALUATION WAS PERFORMED  Russell Austin 02/18/2015, 9:49 AM

## 2015-02-18 NOTE — Progress Notes (Signed)
Physical Therapy Session Note  Patient Details  Name: Russell Austin MRN: [id number] Date of Birth: Austin 21, 1952  Today's Date: 02/18/2015 PT Individual Time: 1330-1500 PT Individual Time Calculation (min): 90 min   Short Term Goals: Week 1:  PT Short Term Goal 1 (Week 1): Patient will propel wheelchair independent level controlled surfaces including set up for transfers. PT Short Term Goal 2 (Week 1): Patient will ambulate 100' level tile and carpet surfaces with LRAD min assist PT Short Term Goal 3 (Week 1): Patient will negotiate 12 stairs with min assist with railing  PT Short Term Goal 4 (Week 1): Patient will demonstrate decreased fall risk with Berg score at least 22/56.  Skilled Therapeutic Interventions/Progress Updates:   Pt received sitting in w/c in room, agreeable to therapy session.  Assisted with clamping NG tube prior to leaving room as well as assisting nurse don new dressing for nose as old one was loose.  Pt propelled to/from therapy gym with L hemi technique at S level.  Once in therapy gym, skilled session focused on R trunk, LE, and UE NMR as well as slow controlled/graded movements with dynamic squatting tasks with weight shifts, tall kneeling tasks transitioning from heels to upright to R weight shifts with RUE assisted into extension.  Note good activation of triceps during task.  Progressed to quadruped reaching with LUE again to further increase forward weight shift and and WB through RLE/UE.  Pt able to transition well from quadruped to/from SL with min/mod A and stabilization at R knee to prevent extension.  Then worked on gait without AD to further challenge balance and address forward weight shift over LLE during stance, quad control and postural control during task.  Performed 64' x 2 reps and during first trial noted that L knee tends to hyperextends, therefore donned recurvatum wrap and noted mild improvement, however continued to note esp when pt took larger step  on LLE.  Ended session with side stepping kicking yoga block to the R then to the L to increase hip abd strength, work on weight shifting and high level balance.  Requires min/mod A throughout task with facilitation for adequate weight shift and cues for sequencing and increased activation in LLE.  Assist needed for placement when side stepping to the R.  Transferred back to w/c and propelled back to room.  Left with all needs in reach.   Therapy Documentation Precautions:  Precautions Precautions: Fall Precaution Comments: NPO Restrictions Weight Bearing Restrictions: No   Pain: Pain Assessment Pain Assessment: No/denies pain   Locomotion : Ambulation Ambulation/Gait Assistance: 3: Mod assist Wheelchair Mobility Distance: 150   See FIM for current functional status  Therapy/Group: Individual Therapy  Denice Bors 02/18/2015, 12:49 PM

## 2015-02-18 NOTE — Progress Notes (Signed)
Occupational Therapy Session Note  Patient Details  Name: Russell [place] MRN: [id number] Date of Birth: 1951/07/31  Today's Date: 02/18/2015 OT Individual Time: 1030-1130 OT Individual Time Calculation (min): 60 min    Short Term Goals: Week 1:  OT Short Term Goal 1 (Week 1): Pt will be able to don LB clothing with min A. OT Short Term Goal 2 (Week 1): Pt will complete a toilet transfer with S. OT Short Term Goal 3 (Week 1): Pt will be able to bathe with S. OT Short Term Goal 4 (Week 1): Pt will be able to use his R arm to wash his L arm with min A. OT Short Term Goal 5 (Week 1): Pt will be able to stand with steadying A as he washes his bottom in shower.  Skilled Therapeutic Interventions/Progress Updates:    Pt seen this session to address RUE AROM and motor control. Pt initiated session in supine on mat with head elevated on wedge. Worked on B shoulder and triceps control with overhead dowel holds, controlled sh flexion and chest presses. Isolated tricep extension/ elbow flex with arm in horizontal adb and with arm overhead. In sitting, used UE ranger for controlled sh flexion with facilitation to prevent scapular elevation/ sh hiking. Grasping and reaching for bean bags with focus on forward reaching and full grasp and release.  Pt tolerated continuous exercise well and then became fatigued at end of session. He is making excellent progress with his AROM but is challenged by hypertone in elbow flexors and smoothness of movement patterns. Provided pt with foam grip for grasp strengthening.  He transferred to chair and was able to take himself back to his room in w/c independently.   Therapy Documentation Precautions:  Precautions Precautions: Fall Precaution Comments: NPO Restrictions Weight Bearing Restrictions: No      Pain: Pain Assessment Pain Assessment: No/denies pain ADL: ADL ADL Comments: refer to FIM   See FIM for current functional status  Therapy/Group:  Individual Therapy  [place] 02/18/2015, 12:17 PM

## 2015-02-19 ENCOUNTER — Inpatient Hospital Stay (HOSPITAL_COMMUNITY): Payer: BC Managed Care – PPO | Admitting: Speech Pathology

## 2015-02-19 ENCOUNTER — Inpatient Hospital Stay (HOSPITAL_COMMUNITY): Payer: BC Managed Care – PPO | Admitting: Occupational Therapy

## 2015-02-19 ENCOUNTER — Inpatient Hospital Stay (HOSPITAL_COMMUNITY): Payer: BC Managed Care – PPO | Admitting: Physical Therapy

## 2015-02-19 LAB — GLUCOSE, CAPILLARY
GLUCOSE-CAPILLARY: 162 mg/dL — AB (ref 65–99)
GLUCOSE-CAPILLARY: 102 mg/dL — AB (ref 65–99)
Glucose-Capillary: 118 mg/dL — ABNORMAL HIGH (ref 65–99)
Glucose-Capillary: 139 mg/dL — ABNORMAL HIGH (ref 65–99)
Glucose-Capillary: 190 mg/dL — ABNORMAL HIGH (ref 65–99)

## 2015-02-19 NOTE — Progress Notes (Signed)
Occupational Therapy Session Note  Patient Details  Name: Russell Austin MRN: [id number] Date of Birth: [birth year]  Today's Date: 02/19/2015 OT Individual Time: [id number] Individual Time Calculation (min): 60 min    Short Term Goals: Week 1:  OT Short Term Goal 1 (Week 1): Pt will be able to don LB clothing with min A. OT Short Term Goal 2 (Week 1): Pt will complete a toilet transfer with S. OT Short Term Goal 3 (Week 1): Pt will be able to bathe with S. OT Short Term Goal 4 (Week 1): Pt will be able to use his R arm to wash his L arm with min A. OT Short Term Goal 5 (Week 1): Pt will be able to stand with steadying A as he washes his bottom in shower.  Skilled Therapeutic Interventions/Progress Updates: Patient participated in skilled OT this morning to address some of his neurological deficits, specificially related to bathing and dressing self - with focus on R UE forced use, dynamic sitting and standing balance.   Patient demonstrated good functional and safety awareness during session today -locating breaks before standing, not moving impulsively, recognizing his deficits.     He did require reminders to use R UE.   His hip flexibility required that he tried to bend forward to don socks with Min A for safety and physical assist.  With more practice crossing knees, patient will be more successful with donning doffing socks.     Therapy Documentation Precautions:  Precautions Precautions: Fall Precaution Comments: NPO Restrictions Weight Bearing Restrictions: No Pain: Pain Assessment Pain Assessment: No/denies pain   Other Treatments:    See FIM for current functional status  Therapy/Group: Individual Therapy  Alfredia Ferguson [facility] 02/19/2015, 12:44 PM

## 2015-02-19 NOTE — Progress Notes (Signed)
Physical Therapy Session Note  Patient Details  Name: Russell Austin MRN: [id number] Date of Birth: [birth year]  Today's Date: 02/19/2015 PT Individual Time: 1000-1100 PT Individual Time Calculation (min): 60 min   Short Term Goals: Week 1:  PT Short Term Goal 1 (Week 1): Patient will propel wheelchair independent level controlled surfaces including set up for transfers. PT Short Term Goal 2 (Week 1): Patient will ambulate 100' level tile and carpet surfaces with LRAD min assist PT Short Term Goal 3 (Week 1): Patient will negotiate 12 stairs with min assist with railing  PT Short Term Goal 4 (Week 1): Patient will demonstrate decreased fall risk with Berg score at least 22/56.  Skilled Therapeutic Interventions/Progress Updates:  Pt was seen bedside in the am. Pt propelled w/c about 150 feet with L UE and LE, S and verbal cues. Pt performed multiple sit to stand transfers with S to min guard. In gym treatment focused on NMR and gait training. Pt performed cone taps, alternating cone taps and criss cross cone taps 3 sets x 10 reps. Pt performed 3 sets x 5 reps sit to stand transfers without assistive device and min guard. Pt ambulated about 80 feet with rolling walker, R hand orthosis and mod A with verbal cues. Also focused on midline position in standing without assistive device. Pt propelled w/c back to room with S and L UE/LE. Pt left sitting up in w/c with call bell within reach.   Therapy Documentation Precautions:  Precautions Precautions: Fall Precaution Comments: NPO Restrictions Weight Bearing Restrictions: No General:   Pain: No c/o pain.    Locomotion : Ambulation Ambulation/Gait Assistance: 3: Mod assist   See FIM for current functional status  Therapy/Group: Individual Therapy  Dub Amis 02/19/2015, 12:27 PM

## 2015-02-19 NOTE — Progress Notes (Signed)
Occupational Therapy Session Note  Patient Details  Name: Russell Austin MRN: [id number] Date of Birth: Sep 12, 1950  Today's Date: 02/19/2015 OT Individual Time: 1300-1355 OT Individual Time Calculation (min): 55 min    Skilled Therapeutic Interventions/Progress Updates: worked on R UE Field seismologist.  Patient complained of tigher right trapezius and stated, "It has my arm locked up."    Moist heat applied and patient stretched his neck/trapezius while this clinician applied manual stretching - to assist with right UE function for self care and IADLs.   Also pateitn was able to return demonstration for right hand retrograde massage and right hand elevation at times to decrease mild swellling.     Therapy Documentation Precautions:  Precautions Precautions: Fall Precaution Comments: NPO Restrictions Weight Bearing Restrictions: No  Pain: Pain Assessment Pain Assessment: No/denies pain  See FIM for current functional status  Therapy/Group: Individual Therapy  Alfredia Ferguson [facility] 02/19/2015, 3:25 PM

## 2015-02-19 NOTE — Progress Notes (Signed)
Speech Language Pathology Daily Session Note  Patient Details  Name: Russell Austin MRN: [id number] Date of Birth: 1950/09/16  Today's Date: 02/19/2015 SLP Individual Time: 0900-0930 SLP Individual Time Calculation (min): 30 min  Short Term Goals: Week 1: SLP Short Term Goal 1 (Week 1): Pt will demonstrate minimal overt s/s aspiration with po trials with supervision verbal cues to determine readines to repeat MBS SLP Short Term Goal 2 (Week 1): Pt will perform pharyngeal strengthening exercises with supervision verbal and visual cues. SLP Short Term Goal 3 (Week 1): Pt will participate in repeat MBS to objectively assess swallow function and safety, and to determne readiness to resume po intake.  Skilled Therapeutic Interventions: Skilled treatment session focused on dysphagia goals.  Upon arrival, pt was seated upright in wheelchair and was awake, alert, and agreeable to participate.  SLP facilitated the session with trials of ice chips following thorough oral care to continue working towards initiation of PO diet.  Pt demonstrated what appeared to be a delayed swallow initiation but did not demonstrate any overt s/s of aspiration and required cues for appropriate head positioning (head in a neutral position vs hyperextension of neck).  Pt returned demonstration of targeted pharyngeal strengthening exercises with supervision cues.  Pt was left upright in wheelchair with all needs left within reach.  Continue per current plan of care.      FIM:  Comprehension Comprehension Mode: Auditory Comprehension: 5-Understands basic 90% of the time/requires cueing < 10% of the time Expression Expression Mode: Verbal Expression: 5-Expresses basic 90% of the time/requires cueing < 10% of the time. Social Interaction Social Interaction: 6-Interacts appropriately with others with medication or extra time (anti-anxiety, antidepressant). Problem Solving Problem Solving: 5-Solves basic 90% of the  time/requires cueing < 10% of the time Memory Memory: 6-More than reasonable amt of time FIM - Eating Eating Activity: 5: Supervision/cues (with trials from SLP)  Pain Pain Assessment Pain Assessment: No/denies pain  Therapy/Group: Individual Therapy  Jarielys Girardot 02/19/2015, 12:30 PM

## 2015-02-19 NOTE — Progress Notes (Signed)
Patient ID: Russell Austin, male   DOB: 04-03-1951, 64 y.o.   MRN: [id number]  02/19/15.  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 Patient trying to use right upper extremity more. Denies any swallowing problems with saliva.   ROS- No CP or SOB, no abdominal pain nausea or vomiting.  Comfortable night without complaints  Past Medical History  Diagnosis Date  . Diabetes mellitus type II   . Hypertension   . ED (erectile dysfunction)   . Diabetic retinopathy FOLLOWED BY DR Zadie Rhine  . 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 Glaucoma of both eyes   . Left hydrocele     Patient Vitals for the past 24 hrs:  BP Temp Temp src Pulse Resp SpO2  02/19/15 0600 (!) 120/58 mmHg 98.5 F (36.9 C) Oral 76 (!) 120 100 %  02/18/15 1400 123/64 mmHg 98.2 F (36.8 C) Oral 70 18 100 %     Intake/Output Summary (Last 24 hours) at 02/19/15 0845 Last data filed at 02/19/15 0143  Gross per 24 hour  Intake      0 ml  Output   1300 ml  Net  -1300 ml   Lab Results  Component Value Date   HGBA1C 7.5* 02/10/2015    CBG (last 3)   Recent Labs  02/18/15 1821 02/19/15 0009 02/19/15 0609  GLUCAP 162* 139* 162*     Objective: Vital Signs: Blood pressure 120/58, pulse 76, temperature 98.5 F (36.9 C), temperature source Oral, resp. rate 120, weight 160 lb 7.9 oz (72.8 kg), SpO2 100 %. No results found. Results for orders placed or performed during the hospital encounter of 02/14/15 (from the past 72 hour(s))  Glucose, capillary     Status: Abnormal   Collection Time: 02/16/15 12:02 PM  Result Value Ref Range   Glucose-Capillary 221 (H) 65 - 99 mg/dL  Glucose, capillary     Status: Abnormal   Collection Time: 02/16/15  6:15 PM  Result Value Ref Range   Glucose-Capillary 186 (H) 65 - 99 mg/dL  Glucose, capillary     Status: Abnormal   Collection Time: 02/17/15  12:32 AM  Result Value Ref Range   Glucose-Capillary 184 (H) 65 - 99 mg/dL  Glucose, capillary     Status: Abnormal   Collection Time: 02/17/15  5:52 AM  Result Value Ref Range   Glucose-Capillary 163 (H) 65 - 99 mg/dL  Basic metabolic panel     Status: Abnormal   Collection Time: 02/17/15  6:47 AM  Result Value Ref Range   Sodium 139 135 - 145 mmol/L   Potassium 4.5 3.5 - 5.1 mmol/L   Chloride 101 101 - 111 mmol/L   CO2 30 22 - 32 mmol/L   Glucose, Bld 191 (H) 65 - 99 mg/dL   BUN 31 (H) 6 - 20 mg/dL   Creatinine, Ser 1.17 0.61 - 1.24 mg/dL   Calcium 8.6 (L) 8.9 - 10.3 mg/dL   GFR calc non Af Amer >60 >60 mL/min   GFR calc Af Amer >60 >60 mL/min    Comment: (NOTE) The eGFR has been calculated using the CKD EPI equation. This calculation has not been validated in all clinical situations. eGFR's persistently <60 mL/min signify possible Chronic Kidney Disease.    Anion gap 8 5 - 15  Glucose, capillary     Status: Abnormal   Collection Time: 02/17/15 11:38 AM  Result Value  Ref Range   Glucose-Capillary 109 (H) 65 - 99 mg/dL  Glucose, capillary     Status: Abnormal   Collection Time: 02/17/15  6:01 PM  Result Value Ref Range   Glucose-Capillary 209 (H) 65 - 99 mg/dL  Glucose, capillary     Status: Abnormal   Collection Time: 02/17/15 11:44 PM  Result Value Ref Range   Glucose-Capillary 158 (H) 65 - 99 mg/dL  Glucose, capillary     Status: Abnormal   Collection Time: 02/18/15  6:38 AM  Result Value Ref Range   Glucose-Capillary 144 (H) 65 - 99 mg/dL  Glucose, capillary     Status: None   Collection Time: 02/18/15 11:49 AM  Result Value Ref Range   Glucose-Capillary 86 65 - 99 mg/dL  Glucose, capillary     Status: Abnormal   Collection Time: 02/18/15  6:21 PM  Result Value Ref Range   Glucose-Capillary 162 (H) 65 - 99 mg/dL   Comment 1 Notify RN   Glucose, capillary     Status: Abnormal   Collection Time: 02/19/15 12:09 AM  Result Value Ref Range   Glucose-Capillary  139 (H) 65 - 99 mg/dL   Comment 1 Notify RN   Glucose, capillary     Status: Abnormal   Collection Time: 02/19/15  6:09 AM  Result Value Ref Range   Glucose-Capillary 162 (H) 65 - 99 mg/dL   Comment 1 Notify RN      HEENT: normal; blind OD Cardio: RRR and no murmur Resp: CTA B/L and unlabored GI: BS positive and NT, ND; NGT in place Extremity:  Pulses positive and No Edema Skin:   Intact Neuro: Alert/Oriented Right upper extremity 3 minus at the deltoid, biceps, triceps, grip 3 minus at the hip flexor on the right 4 minus knee extensor    Medical Problem List and Plan: 1. 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  DVT Prophylaxis/Anticoagulation: Pharmaceutical: Lovenox  3. Fluids/Electrolytes/Nutrition: Continue tube feeds for nutritional support. Add water flushes. Check lytes periodically 4. HTN: Monitor BP every 8 hours. Continue Prinivil daily.   5. Severe dysphagia: NPO. Will have dietician assist with nutritional needs as complaining of hunger--had colonoscopy last Wed.       6. DM type 2: Hgb A1C-7.6.fair control Was on metformin 1000 mg bid with Glucotrol XL 10 mg daily. Now on Lantus due to continuous tube feeds.   7. CKD: Baseline Cr- 1.3.  Creat at baseline Continue to hold HCTZ for now unlesss BP rises. 8. Dyslipidemia: continue Lipitor.     LOS (Days) 5 A FACE TO FACE EVALUATION WAS PERFORMED  Nyoka Cowden 02/19/2015, 8:43 AM

## 2015-02-20 ENCOUNTER — Inpatient Hospital Stay (HOSPITAL_COMMUNITY): Payer: BC Managed Care – PPO | Admitting: Physical Therapy

## 2015-02-20 LAB — GLUCOSE, CAPILLARY
GLUCOSE-CAPILLARY: 168 mg/dL — AB (ref 65–99)
Glucose-Capillary: 112 mg/dL — ABNORMAL HIGH (ref 65–99)
Glucose-Capillary: 143 mg/dL — ABNORMAL HIGH (ref 65–99)
Glucose-Capillary: 100 mg/dL — ABNORMAL HIGH (ref 65–99)
Glucose-Capillary: 109 mg/dL — ABNORMAL HIGH (ref 65–99)

## 2015-02-20 NOTE — Progress Notes (Signed)
Patient reported no longer tasting tube feed but still feeling full. Patient refused resuming tube feed at this time.

## 2015-02-20 NOTE — Progress Notes (Signed)
Patient ID: Russell Austin, male   DOB: [birth year], 64 y.o.   MRN: [id number]  Patient ID: Russell Austin, male   DOB: [birth year], 64 y.o.   MRN: [id number]  02/20/15.  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 Good night.  No complaints this am.  Tolerating NGT feedings. Denies any swallowing problems with saliva.   ROS- No CP or SOB, no abdominal pain nausea or vomiting.  Comfortable night without complaints  Past Medical History  Diagnosis Date  . Diabetes mellitus type II   . Hypertension   . ED (erectile dysfunction)   . Diabetic retinopathy FOLLOWED BY DR Zadie Rhine  . 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 Glaucoma of both eyes   . Left hydrocele     Patient Vitals for the past 24 hrs:  BP Temp Temp src Pulse Resp SpO2 Weight  02/20/15 0500 (!) 144/74 mmHg 98.5 F (36.9 C) Oral 93 18 100 % 177 lb 7.5 oz (80.5 kg)  02/19/15 1501 120/61 mmHg 98.2 F (36.8 C) Oral 87 18 100 % -     Intake/Output Summary (Last 24 hours) at 02/20/15 0824 Last data filed at 02/20/15 0741  Gross per 24 hour  Intake      0 ml  Output   1600 ml  Net  -1600 ml   Lab Results  Component Value Date   HGBA1C 7.5* 02/10/2015    CBG (last 3)   Recent Labs  02/19/15 2135 02/20/15 0104 02/20/15 0628  GLUCAP 118* 112* 168*     Objective: Vital Signs: Blood pressure 144/74, pulse 93, temperature 98.5 F (36.9 C), temperature source Oral, resp. rate 18, weight 177 lb 7.5 oz (80.5 kg), SpO2 100 %. No results found. Results for orders placed or performed during the hospital encounter of 02/14/15 (from the past 72 hour(s))  Glucose, capillary     Status: Abnormal   Collection Time: 02/17/15 11:38 AM  Result Value Ref Range   Glucose-Capillary 109 (H) 65 - 99 mg/dL  Glucose, capillary     Status: Abnormal   Collection Time: 02/17/15  6:01 PM  Result Value  Ref Range   Glucose-Capillary 209 (H) 65 - 99 mg/dL  Glucose, capillary     Status: Abnormal   Collection Time: 02/17/15 11:44 PM  Result Value Ref Range   Glucose-Capillary 158 (H) 65 - 99 mg/dL  Glucose, capillary     Status: Abnormal   Collection Time: 02/18/15  6:38 AM  Result Value Ref Range   Glucose-Capillary 144 (H) 65 - 99 mg/dL  Glucose, capillary     Status: None   Collection Time: 02/18/15 11:49 AM  Result Value Ref Range   Glucose-Capillary 86 65 - 99 mg/dL  Glucose, capillary     Status: Abnormal   Collection Time: 02/18/15  6:21 PM  Result Value Ref Range   Glucose-Capillary 162 (H) 65 - 99 mg/dL   Comment 1 Notify RN   Glucose, capillary     Status: Abnormal   Collection Time: 02/19/15 12:09 AM  Result Value Ref Range   Glucose-Capillary 139 (H) 65 - 99 mg/dL   Comment 1 Notify RN   Glucose, capillary     Status: Abnormal   Collection Time: 02/19/15  6:09 AM  Result Value Ref Range   Glucose-Capillary 162 (H) 65 - 99 mg/dL   Comment 1 Notify RN   Glucose, capillary  Status: Abnormal   Collection Time: 02/19/15 11:43 AM  Result Value Ref Range   Glucose-Capillary 102 (H) 65 - 99 mg/dL   Comment 1 Notify RN   Glucose, capillary     Status: Abnormal   Collection Time: 02/19/15  6:03 PM  Result Value Ref Range   Glucose-Capillary 190 (H) 65 - 99 mg/dL   Comment 1 Document in Chart   Glucose, capillary     Status: Abnormal   Collection Time: 02/19/15  9:35 PM  Result Value Ref Range   Glucose-Capillary 118 (H) 65 - 99 mg/dL  Glucose, capillary     Status: Abnormal   Collection Time: 02/20/15  1:04 AM  Result Value Ref Range   Glucose-Capillary 112 (H) 65 - 99 mg/dL  Glucose, capillary     Status: Abnormal   Collection Time: 02/20/15  6:28 AM  Result Value Ref Range   Glucose-Capillary 168 (H) 65 - 99 mg/dL     HEENT: normal; blind OD Cardio: RRR and no murmur Resp: CTA B/L and unlabored GI: BS positive and NT, ND; NGT in place Extremity:  Pulses  positive and No Edema Skin:   Intact Neuro: Alert/Oriented Right upper extremity 3 minus at the deltoid, biceps, triceps, grip 3 minus at the hip flexor on the right 4 minus knee extensor    Medical Problem List and Plan: 1. 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  DVT Prophylaxis/Anticoagulation: Pharmaceutical: Lovenox  3. Fluids/Electrolytes/Nutrition: Continue tube feeds for nutritional support. Add water flushes. Check lytes periodically 4. HTN: Monitor BP every 8 hours. Continue Prinivil daily.   5. Severe dysphagia: NPO. Will have dietician assist with nutritional needs as complaining of hunger--had colonoscopy last Wed.       6. DM type 2: Hgb A1C-7.6.fair control Was on metformin 1000 mg bid with Glucotrol XL 10 mg daily. Now on Lantus due to continuous tube feeds.   7. CKD: Baseline Cr- 1.3.  Creat at baseline Continue to hold HCTZ for now unlesss BP rises. 8. Dyslipidemia: continue Lipitor.     LOS (Days) 6 A FACE TO FACE EVALUATION WAS PERFORMED  Nyoka Cowden 02/20/2015, 8:24 AM

## 2015-02-20 NOTE — Progress Notes (Signed)
At 1550 patient reported tasting some of the tube feeding. Henri Medal and I verified placement with ascultation. Dr Burnice Logan notified of patients report. Tube feeding temporarily stopped. To continue later.

## 2015-02-20 NOTE — Progress Notes (Signed)
Physical Therapy Session Note  Patient Details  Name: Russell Austin MRN: [id number] Date of Birth: [birth year]  Today's Date: 02/20/2015 PT Individual Time: 1300-1345 PT Individual Time Calculation (min): 45 min   Short Term Goals: Week 1:  PT Short Term Goal 1 (Week 1): Patient will propel wheelchair independent level controlled surfaces including set up for transfers. PT Short Term Goal 2 (Week 1): Patient will ambulate 100' level tile and carpet surfaces with LRAD min assist PT Short Term Goal 3 (Week 1): Patient will negotiate 12 stairs with min assist with railing  PT Short Term Goal 4 (Week 1): Patient will demonstrate decreased fall risk with Berg score at least 22/56.  Skilled Therapeutic Interventions/Progress Updates:  Pt was seen bedside in the pm. Pt propelled w/c to gym with L UE/LE about 150 feet with S. Pt ambulated about 70 feet without assistive device, mod A and verbal cues. Pt ambulated with [facility] about 70 feet with min to mod A and verbal cues. Pt ambulated 50 feet x 2 and 150 feet x 2 with WBQC and min A with verbal cues for technique and safety. Occasional LOB with min A to correct. Following treatment pt returned to room and left sitting up in w/c with family at bedside.   Therapy Documentation Precautions:  Precautions Precautions: Fall Precaution Comments: NPO Restrictions Weight Bearing Restrictions: No General:   Pain: No c/o pain.    Locomotion : Ambulation Ambulation/Gait Assistance: 4: Min assist   See FIM for current functional status  Therapy/Group: Individual Therapy  Dub Amis 02/20/2015, 3:59 PM

## 2015-02-21 ENCOUNTER — Inpatient Hospital Stay (HOSPITAL_COMMUNITY): Payer: BC Managed Care – PPO

## 2015-02-21 ENCOUNTER — Inpatient Hospital Stay (HOSPITAL_COMMUNITY): Payer: BC Managed Care – PPO | Admitting: Speech Pathology

## 2015-02-21 ENCOUNTER — Inpatient Hospital Stay (HOSPITAL_COMMUNITY): Payer: BC Managed Care – PPO | Admitting: Occupational Therapy

## 2015-02-21 LAB — GLUCOSE, CAPILLARY
GLUCOSE-CAPILLARY: 85 mg/dL (ref 65–99)
Glucose-Capillary: 104 mg/dL — ABNORMAL HIGH (ref 65–99)
Glucose-Capillary: 138 mg/dL — ABNORMAL HIGH (ref 65–99)
Glucose-Capillary: 165 mg/dL — ABNORMAL HIGH (ref 65–99)

## 2015-02-21 NOTE — Progress Notes (Signed)
Speech Language Pathology Weekly Progress and Session Note  Patient Details  Name: Russell Austin MRN: [id number] Date of Birth: [birth year]  Beginning of progress report period: February 14, 2015 End of progress report period: February 21, 2015  Today's Date: 02/21/2015 SLP Individual Time: 0037-0488 SLP Individual Time Calculation (min): 29 min  Short Term Goals: Week 1: SLP Short Term Goal 1 (Week 1): Pt will demonstrate minimal overt s/s aspiration with po trials with supervision verbal cues to determine readines to repeat MBS SLP Short Term Goal 1 - Progress (Week 1): Met SLP Short Term Goal 2 (Week 1): Pt will perform pharyngeal strengthening exercises with supervision verbal and visual cues. SLP Short Term Goal 2 - Progress (Week 1): Met SLP Short Term Goal 3 (Week 1): Pt will participate in repeat MBS to objectively assess swallow function and safety, and to determne readiness to resume po intake. (MBS scheduled for tomorrow ) SLP Short Term Goal 3 - Progress (Week 1): Met    New Short Term Goals: Week 2: SLP Short Term Goal 1 (Week 2): Pt will demonstrate minimal overt s/s aspiration with po trials with mod I use of swallowing precautions.  SLP Short Term Goal 2 (Week 2): Pt will perform pharyngeal strengthening exercises with mod I  Weekly Progress Updates:  Pt made functional gains this reporting period and has met 3 out of 3 short term goals.  Pt continues to present with s/s of a severe pharyngeal dysphagia characterized by suspected delayed swallow initiation consistent with reports of initial MBS on 02/10/2015.  He is consuming small amounts of purees and ice chips to continue working towards initiation of PO diet and pt has demonstrated some pre-functional gains indicating readiness for repeat MBS at next available appointment. Pt would continue to benefit from skilled ST targeting dysphagia while inpatient in order to maximize functional independence and reduce burden of care  prior to discharge.  Pt and family education is ongoing.       Intensity: Minumum of 1-2 x/day, 30 to 90 minutes Frequency: 3 to 5 out of 7 days Duration/Length of Stay: 14-16 days Treatment/Interventions: Patient/family education;Multimodal communication approach;Neuromuscular electrical stimulation;Dysphagia/aspiration precaution training;Therapeutic Exercise   Daily Session  Skilled Therapeutic Interventions: Pt was seen for skilled ST targeting dysphagia goals.  Upon arrival, pt was seated upright in wheelchair, awake, alert, and agreeable to participate in ST.  SLP facilitated the session with PO trials of purees and ice chips following oral care to continue working towards readiness for repeat objective swallow study.  Pt continues to present with suspected delayed swallow initiation.  Hyolaryngeal elevation and consistency of swallow responses continues to improve per laryngeal palpation.  No overt s/s of aspiration were evident with purees or ice chips.  Pt's voice remains clear and strong, he demonstrates good management of oral secretions, and he presents with a strong volitional cough.  As a result, suspect that pt may benefit from a repeat MBS at next available appointment to determine readiness for initiation of PO diet.  Goals updated on this date to reflect current progress and plan of care.           FIM:  Comprehension Comprehension Mode: Auditory Comprehension: 5-Understands basic 90% of the time/requires cueing < 10% of the time Expression Expression Mode: Verbal Expression: 6-Expresses complex ideas: With extra time/assistive device Social Interaction Social Interaction: 6-Interacts appropriately with others with medication or extra time (anti-anxiety, antidepressant). Problem Solving Problem Solving: 5-Solves complex 90% of the time/cues < 10%  of the time Memory Memory: 6-More than reasonable amt of time FIM - Eating Eating Activity: 5: Supervision/cues (with trials  ) General    Pain Pain Assessment Pain Assessment: No/denies pain  Therapy/Group: Individual Therapy  Destanee Bedonie, Selinda Orion 02/21/2015, 3:43 PM

## 2015-02-21 NOTE — Progress Notes (Signed)
Occupational Therapy Session Note  Patient Details  Name: Russell Austin MRN: [id number] Date of Birth: 03/28/1951  Today's Date: 02/21/2015 OT Individual Time: 1100-1200 and 1332-1402 OT Individual Time Calculation (min): 60 min and 30 min   Short Term Goals: Week 1:  OT Short Term Goal 1 (Week 1): Pt will be able to don LB clothing with min A. OT Short Term Goal 2 (Week 1): Pt will complete a toilet transfer with S. OT Short Term Goal 3 (Week 1): Pt will be able to bathe with S. OT Short Term Goal 4 (Week 1): Pt will be able to use his R arm to wash his L arm with min A. OT Short Term Goal 5 (Week 1): Pt will be able to stand with steadying A as he washes his bottom in shower.  Skilled Therapeutic Interventions/Progress Updates:    1) Engaged in ADL retraining with focus on functional transfers, sit <> stand, and functional use of RUE during self-care tasks.  Pt had clothes set out upon arrival.  Performed stand pivot transfer w/c > shower seat in room shower with min assist and use of grab bars for steadying.  Pt completed bathing at sit > stand level with min assist for stability when standing to wash buttocks.  Dressing completed at sit > stand level at sink with use of RUE as gross assist with opening containers and applying lotion to Lt arm.  Hand over hand assist with RUE when threading Lt sleeve to promote increased ROM.  Discussed options for tying shoes, issued pt shoe buttons with pt return demonstrating fastening shoes.  On therapy mat engaged in [place] in sidelying with focus on scapular mobilizations to decrease "tightness" with shoulder flexion/extension.    2)  Engaged in therapeutic activity with focus on Rt shoulder and elbow flexion/extension in unsupported sitting.  On therapy mat engaged in shoulder flexion/extension with hand over hand assist to maintain grasp on ski pole.  Tactile cues at scapula and shoulder to decrease shoulder hike and promote increased smoothness  of movement.  Utilized ball in both hands with focus on internal rotation and symmetrical shoulder flexion with pt reporting increase in shoulder "tightness".  Scapular mobilizations performed in sidelying to decrease tightness and promote increased ROM.  Returned to sitting with use of mirror for visual input with repeat of shoulder flexion activity, therapist providing tactile cues to promote ROM and decrease shoulder hike while pt visually attending to positioning.  Pt reports decrease in tightness post mobilizations.  Therapy Documentation Precautions:  Precautions Precautions: Fall Precaution Comments: NPO Restrictions Weight Bearing Restrictions: No Pain:  Pt with no c/o pain ADL: ADL ADL Comments: refer to FIM  See FIM for current functional status  Therapy/Group: Individual Therapy  Simonne Come 02/21/2015, 12:33 PM

## 2015-02-21 NOTE — Progress Notes (Signed)
Physical Therapy Session Note  Patient Details  Name: Russell Austin MRN: [id number] Date of Birth: Sep 11, 1950  Today's Date: 02/21/2015 PT Individual Time:0800-0900, [id number] PT Individual Time Calculation (min): 60, 35 min   Short Term Goals: Week 1:  PT Short Term Goal 1 (Week 1): Patient will propel wheelchair independent level controlled surfaces including set up for transfers. PT Short Term Goal 2 (Week 1): Patient will ambulate 100' level tile and carpet surfaces with LRAD min assist PT Short Term Goal 3 (Week 1): Patient will negotiate 12 stairs with min assist with railing  PT Short Term Goal 4 (Week 1): Patient will demonstrate decreased fall risk with Berg score at least 22/56.  Skilled Therapeutic Interventions/Progress Updates:   tx 1 focused on w/c mobility, transfers, therapeutic activities ,euromuscular re-education via demo, forced use, wt bearing, VCs, manual cues.  W/c propulsion x 150' using hemi method with distant supervision.    W/c set up for transfer with supervision except for swinging away R foot rest, without cues.    Squat pivot transfers to L and R with min guard/supervision assist.  neuromuscular re-education on NuStep at level 4 x 8 minutes using R hand cuff, for alernating reciprocal movements.  Active rest on mat with RUE wt bearing through forearm.  Pt propelled to room modified independent.  Tx 2 :pt brought himself to PT modified independent in w/c.  Therapeutic activities for neuro re-ed via forced use, VCs, manual cues in sitting, manipulating soccer ball under R foot in supported sitting, forward/backward and side><side and grasping ball between feet during bil knee ext and bil hip flex, for coordination/motor control R hip. In standing, biased to R ( with L foot on football) during R knee flex/ext with control.  Gait with WBQC x 50' with mod assist , mod cues for sequencing, safe use of QC, upright trunk, forward gaze, balance due to jerky  movements RLE.    Modified independent w/c propulsion back to room.    Therapy Documentation Precautions:  Precautions Precautions: Fall Precaution Comments: NPO Restrictions Weight Bearing Restrictions: No  Pain:- none reported Pain Assessment Pain Assessment: No/denies pain      See FIM for current functional status  Therapy/Group: Individual Therapy  Carriann Hesse 02/21/2015, 3:54 PM

## 2015-02-22 ENCOUNTER — Inpatient Hospital Stay (HOSPITAL_COMMUNITY): Payer: BC Managed Care – PPO | Admitting: Speech Pathology

## 2015-02-22 ENCOUNTER — Inpatient Hospital Stay (HOSPITAL_COMMUNITY): Payer: BC Managed Care – PPO | Admitting: Rehabilitation

## 2015-02-22 ENCOUNTER — Inpatient Hospital Stay (HOSPITAL_COMMUNITY): Payer: BC Managed Care – PPO | Admitting: Occupational Therapy

## 2015-02-22 ENCOUNTER — Inpatient Hospital Stay (HOSPITAL_COMMUNITY): Payer: BC Managed Care – PPO

## 2015-02-22 ENCOUNTER — Inpatient Hospital Stay (HOSPITAL_COMMUNITY): Payer: BC Managed Care – PPO | Admitting: *Deleted

## 2015-02-22 ENCOUNTER — Inpatient Hospital Stay (HOSPITAL_COMMUNITY): Payer: BC Managed Care – PPO | Admitting: Physical Therapy

## 2015-02-22 LAB — BASIC METABOLIC PANEL
ANION GAP: 7 (ref 5–15)
ANION GAP: 11 (ref 5–15)
BUN: 31 mg/dL — ABNORMAL HIGH (ref 6–20)
BUN: 33 mg/dL — ABNORMAL HIGH (ref 6–20)
CHLORIDE: 102 mmol/L (ref 101–111)
CO2: 26 mmol/L (ref 22–32)
CO2: 25 mmol/L (ref 22–32)
CREATININE: 1.4 mg/dL — AB (ref 0.61–1.24)
Calcium: 8.7 mg/dL — ABNORMAL LOW (ref 8.9–10.3)
Calcium: 8.7 mg/dL — ABNORMAL LOW (ref 8.9–10.3)
Chloride: 103 mmol/L (ref 101–111)
Creatinine, Ser: 1.5 mg/dL — ABNORMAL HIGH (ref 0.61–1.24)
GFR calc Af Amer: 55 mL/min — ABNORMAL LOW (ref >60)
GFR calc non Af Amer: 48 mL/min — ABNORMAL LOW (ref >60)
GFR, EST NON AFRICAN AMERICAN: 52 mL/min — AB (ref >60)
GLUCOSE: 195 mg/dL — AB (ref 65–99)
Glucose, Bld: 175 mg/dL — ABNORMAL HIGH (ref 65–99)
Potassium: 6.6 mmol/L (ref 3.5–5.1)
Potassium: 5.1 mmol/L (ref 3.5–5.1)
SODIUM: 135 mmol/L (ref 135–145)
SODIUM: 139 mmol/L (ref 135–145)

## 2015-02-22 LAB — GLUCOSE, CAPILLARY
GLUCOSE-CAPILLARY: 188 mg/dL — AB (ref 65–99)
GLUCOSE-CAPILLARY: 187 mg/dL — AB (ref 65–99)
Glucose-Capillary: 117 mg/dL — ABNORMAL HIGH (ref 65–99)
Glucose-Capillary: 204 mg/dL — ABNORMAL HIGH (ref 65–99)

## 2015-02-22 MED ORDER — SODIUM CHLORIDE 0.9 % IV SOLN
INTRAVENOUS | Status: DC
  Administered 2015-02-22: 23:00:00 via INTRAVENOUS
  Administered 2015-02-22: 1000 mL via INTRAVENOUS
  Administered 2015-02-23: 08:00:00 via INTRAVENOUS

## 2015-02-22 MED ORDER — SODIUM POLYSTYRENE SULFONATE 15 GM/60ML PO SUSP
30.0000 g | Freq: Once | ORAL | Status: AC
  Administered 2015-02-22: 30 g via ORAL
  Filled 2015-02-22: qty 120

## 2015-02-22 NOTE — Progress Notes (Signed)
Subjective/Complaints: Sneezed out NG tube, planning to have modified barium swallow study today. Reported taking applesauce as well as ice chips without coughing or choking yesterday with speech therapy ROS- No CP or SOB, no abdominal pain nausea or vomiting, weakness on the right side Objective: Vital Signs: Blood pressure 124/78, pulse 76, temperature 97.7 F (36.5 C), temperature source Oral, resp. rate 17, weight 79.2 kg (174 lb 9.7 oz), SpO2 98 %. No results found. Results for orders placed or performed during the hospital encounter of 02/14/15 (from the past 72 hour(s))  Glucose, capillary     Status: Abnormal   Collection Time: 02/19/15 11:43 AM  Result Value Ref Range   Glucose-Capillary 102 (H) 65 - 99 mg/dL   Comment 1 Notify RN   Glucose, capillary     Status: Abnormal   Collection Time: 02/19/15  6:03 PM  Result Value Ref Range   Glucose-Capillary 190 (H) 65 - 99 mg/dL   Comment 1 Document in Chart   Glucose, capillary     Status: Abnormal   Collection Time: 02/19/15  9:35 PM  Result Value Ref Range   Glucose-Capillary 118 (H) 65 - 99 mg/dL  Glucose, capillary     Status: Abnormal   Collection Time: 02/20/15  1:04 AM  Result Value Ref Range   Glucose-Capillary 112 (H) 65 - 99 mg/dL  Glucose, capillary     Status: Abnormal   Collection Time: 02/20/15  6:28 AM  Result Value Ref Range   Glucose-Capillary 168 (H) 65 - 99 mg/dL  Glucose, capillary     Status: Abnormal   Collection Time: 02/20/15 11:36 AM  Result Value Ref Range   Glucose-Capillary 143 (H) 65 - 99 mg/dL  Glucose, capillary     Status: Abnormal   Collection Time: 02/20/15  6:12 PM  Result Value Ref Range   Glucose-Capillary 100 (H) 65 - 99 mg/dL   Comment 1 Document in Chart   Glucose, capillary     Status: Abnormal   Collection Time: 02/20/15 11:03 PM  Result Value Ref Range   Glucose-Capillary 109 (H) 65 - 99 mg/dL  Glucose, capillary     Status: Abnormal   Collection Time: 02/21/15  6:03 AM   Result Value Ref Range   Glucose-Capillary 104 (H) 65 - 99 mg/dL  Glucose, capillary     Status: Abnormal   Collection Time: 02/21/15 11:42 AM  Result Value Ref Range   Glucose-Capillary 138 (H) 65 - 99 mg/dL  Glucose, capillary     Status: Abnormal   Collection Time: 02/21/15  7:00 PM  Result Value Ref Range   Glucose-Capillary 165 (H) 65 - 99 mg/dL  Glucose, capillary     Status: None   Collection Time: 02/21/15 11:40 PM  Result Value Ref Range   Glucose-Capillary 85 65 - 99 mg/dL  Glucose, capillary     Status: Abnormal   Collection Time: 02/22/15  5:59 AM  Result Value Ref Range   Glucose-Capillary 117 (H) 65 - 99 mg/dL     HEENT: normal Cardio: RRR and no murmur Resp: CTA B/L and unlabored GI: BS positive and NT, ND Extremity:  Pulses positive and No Edema Skin:   Intact Neuro: Alert/Oriented Right upper extremity 3 minus at the deltoid, biceps, triceps, grip 3 minus at the hip flexor on the right 4 minus knee extensor   Assessment/Plan: 1. 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 Physiatrist is providing close team supervision and 24 hour management of active medical problems listed below. Physiatrist and rehab team continue to assess barriers to discharge/monitor patient progress toward functional and medical goals.  FIM: FIM - Bathing Bathing Steps Patient Completed: Chest, Abdomen, Front perineal area, Right upper leg, Left upper leg, Right lower leg (including foot), Left lower leg (including foot), Buttocks, Right Arm Bathing: 4: Min-Patient completes 8-9 74f 10 parts or 75+ percent  FIM - Upper Body Dressing/Undressing Upper body dressing/undressing steps patient completed: Thread/unthread left sleeve of pullover shirt/dress, Pull shirt over trunk, Put head through opening of pull over shirt/dress, Thread/unthread  right sleeve of pullover shirt/dresss Upper body dressing/undressing: 5: Set-up assist to: Obtain clothing/put away FIM - Lower Body Dressing/Undressing Lower body dressing/undressing steps patient completed: Thread/unthread right underwear leg, Thread/unthread left underwear leg, Pull underwear up/down, Thread/unthread right pants leg, Thread/unthread left pants leg, Pull pants up/down, Don/Doff left sock, Don/Doff left shoe, Fasten/unfasten right shoe, Fasten/unfasten left shoe Lower body dressing/undressing: 4: Min-Patient completed 75 plus % of tasks  FIM - Toileting Toileting steps completed by patient: Adjust clothing prior to toileting, Performs perineal hygiene, Adjust clothing after toileting Toileting Assistive Devices: Grab bar or rail for support Toileting: 0: Activity did not occur  FIM - Radio producer Devices: Grab bars Toilet Transfers: 4-To toilet/BSC: Min A (steadying Pt. > 75%), 4-From toilet/BSC: Min A (steadying Pt. > 75%)  FIM - Bed/Chair Transfer Bed/Chair Transfer Assistive Devices: Arm rests Bed/Chair Transfer: 4: Chair or W/C > Bed: Min A (steadying Pt. > 75%), 4: Bed > Chair or W/C: Min A (steadying Pt. > 75%)  FIM - Locomotion: Wheelchair Distance: 150 Locomotion: Wheelchair: 6: Travels 150 ft or more, turns around, maneuvers to table, bed or toilet, negotiates 3% grade: maneuvers on rugs and over door sills independently FIM - Locomotion: Ambulation Locomotion: Ambulation Assistive Devices: Nurse, adult Ambulation/Gait Assistance: 3: Mod assist Locomotion: Ambulation: 2: Travels 50 - 149 ft with moderate assistance (Pt: 50 - 74%)  Comprehension Comprehension Mode: Auditory Comprehension: 5-Follows basic conversation/direction: With extra time/assistive device  Expression Expression Mode: Verbal Expression: 6-Expresses complex ideas: With extra time/assistive device  Social Interaction Social Interaction: 6-Interacts  appropriately with others with medication or extra time (anti-anxiety, antidepressant).  Problem Solving Problem Solving: 5-Solves complex 90% of the time/cues < 10% of the time  Memory Memory: 6-More than reasonable amt of time  Medical Problem List and Plan: 1. 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  DVT Prophylaxis/Anticoagulation: Pharmaceutical: Lovenox 3. Pain Management: N/A 4. Mood: LCSW to follow for evaluation and support. Has good family support.   5. Neuropsych: This patient is capable of making decisions on his own behalf. 6. Skin/Wound Care: Routine pressure relief measures.   7. Fluids/Electrolytes/Nutrition: Continue tube feeds for nutritional support. Add water flushes. Check lytes in am.   8. HTN: Monitor BP every 8 hours. Continue Prinivil daily.   9. Severe dysphagia: NPO. Will have dietician assist with nutritional needs as complaining of hunger--had colonoscopy last Wed.       10. DM type 2: Hgb A1C-7.6.fair control Was on metformin 1000 mg bid with Glucotrol XL 10 mg daily. Now on Lantus due to continuous tube feeds.   11. CKD: Baseline Cr- 1.3.  Creat at baseline Continue to hold HCTZ for now unlesss BP rises. 12. Dyslipidemia: continue Lipitor.     LOS (Days) 8 A FACE  TO FACE EVALUATION WAS PERFORMED  Alysia Penna E 02/22/2015, 8:26 AM

## 2015-02-22 NOTE — Progress Notes (Signed)
Physical Therapy Weekly Progress Note  Patient Details  Name: Russell Austin MRN: [id number] Date of Birth: Apr 24, 1951  Beginning of progress report period: February 15, 2015 End of progress report period: February 22, 2015  Today's Date: 02/22/2015 PT Individual Time: 1310-1346 PT Individual Time Calculation (min): 36 min   Patient has made good progress and met 4 of 4 short term goals.  Pt is currently supervision-min A overall for transfers, w/c mobility, gait with quad cane and stair negotiation.      Patient continues to demonstrate the following deficits: impaired strength, motor control, timing/sequencing and motor planning, impaired postural control and balance, impaired coordination and muscle grading, impaired gait and therefore will continue to benefit from skilled PT intervention to enhance overall performance with balance, postural control, ability to compensate for deficits, functional use of  right upper extremity and coordination.  Patient progressing toward long term goals..  Continue plan of care.  PT Short Term Goals Week 1:  PT Short Term Goal 1 (Week 1): Patient will propel wheelchair independent level controlled surfaces including set up for transfers. PT Short Term Goal 1 - Progress (Week 1): Met PT Short Term Goal 2 (Week 1): Patient will ambulate 100' level tile and carpet surfaces with LRAD min assist PT Short Term Goal 2 - Progress (Week 1): Met PT Short Term Goal 3 (Week 1): Patient will negotiate 12 stairs with min assist with railing  PT Short Term Goal 3 - Progress (Week 1): Met PT Short Term Goal 4 (Week 1): Patient will demonstrate decreased fall risk with Berg score at least 22/56. PT Short Term Goal 4 - Progress (Week 1): Met Week 2:  PT Short Term Goal 1 (Week 2): = LTG of MOD I overall for D/C 03/01/15  Skilled Therapeutic Interventions/Progress Updates:   Pt received in w/c.  Denies pain.  Performed w/c mobility x 150' in controlled environment with L  hemi technique and mod I.  In gym performed stair negotiation training up/down 4 stairs (6") x 3 reps forwards to ascend, backwards to descend due to IV pole with min A overall with pt performing alternating sequence to ascend and step to sequence to descend to focus on weight shifting, coordination and muscle grading of RLE.  Verbal cues needed for active knee flexion of RLE and for safe foot placement.  Initiated BERG balance re-assessment but near end of assessment pt reporting need to have a BM.  Returned to room in w/c and performed ambulation w/c > toilet with LUE support/min A and assistance for IV pole with pt standing with min A for balance while doffing pants.  Pt requesting to spend prolonged time on toilet; RN aware and present outside room to assist when finished.     Therapy Documentation Precautions:  Precautions Precautions: Fall Precaution Comments: NPO Restrictions Weight Bearing Restrictions: No General: PT Amount of Missed Time (min): 9 Minutes PT Missed Treatment Reason: Toileting Vital Signs: Therapy Vitals Temp: 99 F (37.2 C) Temp Source: Oral Pulse Rate: (!) 108 Resp: 18 BP: 129/77 mmHg Patient Position (if appropriate): Sitting Oxygen Therapy SpO2: 99 % O2 Device: Not Delivered Pain: Pain Assessment Pain Assessment: No/denies pain Locomotion : Ambulation Ambulation/Gait Assistance: 4: Min assist Wheelchair Mobility Distance: 150   Balance: Berg Balance Test Sit to Stand: Able to stand  independently using hands Standing Unsupported: Able to stand 2 minutes with supervision Sitting with Back Unsupported but Feet Supported on Floor or Stool: Able to sit safely and securely 2  minutes Stand to Sit: Controls descent by using hands Transfers: Needs one person to assist Standing Unsupported with Eyes Closed: Needs help to keep from falling Standing Ubsupported with Feet Together: Needs help to attain position but able to stand for 30 seconds with feet  together From Standing, Reach Forward with Outstretched Arm: Can reach forward >12 cm safely (5") From Standing Position, Pick up Object from Floor: Able to pick up shoe, needs supervision From Standing Position, Turn to Look Behind Over each Shoulder: Needs supervision when turning Turn 360 Degrees: Needs assistance while turning Standing Unsupported, Alternately Place Feet on Step/Stool: Needs assistance to keep from falling or unable to try Standing Unsupported, One Foot in Front: Needs help to step but can hold 15 seconds Standing on One Leg: Unable to try or needs assist to prevent fall Total Score: 23 improved from 13/56 on evaluation; still at high risk for falls.  Patient demonstrates increased fall risk as noted by score of  23/56 on Berg Balance Scale.  (<36= high risk for falls, close to 100%; 37-45 significant >80%; 46-51 moderate >50%; 52-55 lower >25%)  See FIM for current functional status  Therapy/Group: Individual Therapy  Raylene Everts [facility] 02/22/2015, 4:25 PM

## 2015-02-22 NOTE — Significant Event (Signed)
CRITICAL VALUE ALERT  Critical value received:  K+ 6.6 not hemolyzed   Date of notification:  02/22/2015   Time of notification:  1118  Critical value read back:Yes.    Nurse who received alert:  Dorthula Nettles, RN  MD notified (1st page):  Algis Liming  Time of first page:  1125  MD notified (2nd page):  Time of second page:  Responding MD:  Algis Liming  Time MD responded:  [id number]   Received orders to dc Lisinopril, hold Tizanidine, Start IV fluids, get baseline EKG and give Kayexalate.

## 2015-02-22 NOTE — Progress Notes (Signed)
Physical Therapy Session Note  Patient Details  Name: Russell Austin MRN: [id number] Date of Birth: 1950-10-26  Today's Date: 02/22/2015 PT Individual Time: 1533-1600 PT Individual Time Calculation (min): 27 min   Short Term Goals: Week 2:  PT Short Term Goal 1 (Week 2): = LTG of MOD I overall for D/C 03/01/15  Skilled Therapeutic Interventions/Progress Updates:   Pt received sitting in w/c, SLP in room addressing new upgrade to diet.  Pt very excited about being able to eat.  Educated on finishing BERG balance test that previous PT had started.  Pt agreeable.  Pt with new score of 23/56, still indicative of significant fall risk, however pt has made 10 point gain in one week, indicative of clinical and functional change in balance.  Remainder of session focused on NMR for RUE with self feeding and reaching tasks.  Provided light facilitation at shoulder and elbow at times as well as at hand during task for safety.  Cues for decreased shoulder movement and to perform slow graded movement with increased attention to task.  Tolerated well.  Left in w/c with all needs in reach.    Therapy Documentation Precautions:  Precautions Precautions: Fall Precaution Comments: NPO Restrictions Weight Bearing Restrictions: No   Pain: Pt with no c/o pain during session.   Berg Balance Test Sit to Stand: Able to stand  independently using hands Standing Unsupported: Able to stand 2 minutes with supervision Sitting with Back Unsupported but Feet Supported on Floor or Stool: Able to sit safely and securely 2 minutes Stand to Sit: Controls descent by using hands Transfers: Needs one person to assist Standing Unsupported with Eyes Closed: Needs help to keep from falling Standing Ubsupported with Feet Together: Needs help to attain position but able to stand for 30 seconds with feet together From Standing, Reach Forward with Outstretched Arm: Can reach forward >12 cm safely (5") From Standing  Position, Pick up Object from Floor: Able to pick up shoe, needs supervision From Standing Position, Turn to Look Behind Over each Shoulder: Needs supervision when turning Turn 360 Degrees: Needs assistance while turning Standing Unsupported, Alternately Place Feet on Step/Stool: Needs assistance to keep from falling or unable to try Standing Unsupported, One Foot in Front: Needs help to step but can hold 15 seconds Standing on One Leg: Unable to try or needs assist to prevent fall Total Score: 23  See FIM for current functional status  Therapy/Group: Individual Therapy  Denice Bors 02/22/2015, 12:56 PM

## 2015-02-22 NOTE — Plan of Care (Signed)
Problem: RH SAFETY Goal: RH STG ADHERE TO SAFETY PRECAUTIONS W/ASSISTANCE/DEVICE STG Adhere to Safety Precautions With minimal Assistance/Device.  Outcome: Progressing Needs reminders to not get up alone

## 2015-02-22 NOTE — Progress Notes (Signed)
Pt was referred by family because Pt an Chaplain's prior relationship. Pt  was sitting up. Chaplain and Pt discussed religious conversion experience. Pt prayed. Chaplain will follow up.    02/22/15 1500  Clinical Encounter Type  Visited With Patient  Visit Type Spiritual support  Referral From Family  Spiritual Encounters  Spiritual Needs Prayer  Stress Factors  Patient Stress Factors Major life changes

## 2015-02-22 NOTE — Progress Notes (Signed)
Speech Language Pathology Note  Patient Details  Name: Russell Austin MRN: [id number] Date of Birth: 12/16/1950 Today's Date: 02/22/2015  MBSS complete. Full report located under chart review in imaging section.    [name], Selinda Orion 02/22/2015, 4:22 PM

## 2015-02-22 NOTE — Progress Notes (Signed)
Speech Language Pathology Daily Session Note  Patient Details  Name: Russell Austin MRN: [id number] Date of Birth: 07-24-1951  Today's Date: 02/22/2015 SLP Individual Time: 1500-1530 SLP Individual Time Calculation (min): 30 min  Short Term Goals: Week 2: SLP Short Term Goal 1 (Week 2): Pt will demonstrate minimal overt s/s aspiration with po trials with mod I use of swallowing precautions.  SLP Short Term Goal 2 (Week 2): Pt will perform pharyngeal strengthening exercises with mod I  Skilled Therapeutic Interventions:  Pt was seen for skilled ST targeting ongoing education and dysphagia goals.  Upon arrival, pt was sitting upright in wheelchair, awake, alert, and agreeable to participate in ST.  SLP facilitated the session with trials of dys 1 textures and nectar thick liquids per MBS this AM. Pt consumed the abovementioned consistencies with supervision cues for use of extra swallows, intermittent throat clear, alternating purees with liquids, and taking small bites/sips.  Pt's vocal quality remained clear throughout trials; however, pt required increased time to generate extra swallows due to dry mouth.  SLP provided extensive education regarding rationale behind currently prescribed diet.  Pt verbalized understanding that the goal of diet initiation was for comfort feedings to therapeutically strengthen his swallow.  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 Pt also verbalized understanding that aspiration of foods and liquids could result in aspiration pneumonia or in severe cases, death.   Pt reports that he feels the risks of aspiration are outweighed by the potential benefits of initiation of PO diet.  Pt remains reluctant to consider alternative non oral means of nutrition but asked appropriate questions during SLP education regarding tube feeds and requested  further education with his wife present regarding potential longer term options.   He remains hopeful that he will be able to safely meet his nutritional needs orally but seemed more realistic after speaking with SLP and experiencing the level of effort required to utilize swallowing precautions.   Discussed results and recommendations of study with P.A. this AM.  SLP will continue to follow up for diet toleration and ongoing education.  Continue per current plan of care.    FIM:  Comprehension Comprehension Mode: Auditory Comprehension: 5-Follows basic conversation/direction: With extra time/assistive device Expression Expression Mode: Verbal Expression: 6-Expresses complex ideas: With extra time/assistive device Social Interaction Social Interaction: 6-Interacts appropriately with others with medication or extra time (anti-anxiety, antidepressant). Problem Solving Problem Solving: 5-Solves complex 90% of the time/cues < 10% of the time Memory Memory: 6-More than reasonable amt of time FIM - Eating Eating Activity: 5: Supervision/cues  Pain Pain Assessment Pain Assessment: No/denies pain  Therapy/Group: Individual Therapy  Russell Austin, Selinda Orion 02/22/2015, 4:24 PM

## 2015-02-22 NOTE — Progress Notes (Signed)
Occupational Therapy Session Note  Patient Details  Name: Russell Austin MRN: [id number] Date of Birth: 1950-09-29  Today's Date: 02/22/2015 OT Individual Time: 0800-0900 OT Individual Time Calculation (min): 60 min    Short Term Goals: Week 1:  OT Short Term Goal 1 (Week 1): Pt will be able to don LB clothing with min A. OT Short Term Goal 2 (Week 1): Pt will complete a toilet transfer with S. OT Short Term Goal 3 (Week 1): Pt will be able to bathe with S. OT Short Term Goal 4 (Week 1): Pt will be able to use his R arm to wash his L arm with min A. OT Short Term Goal 5 (Week 1): Pt will be able to stand with steadying A as he washes his bottom in shower.  Skilled Therapeutic Interventions/Progress Updates:    Pt seen this session for BADL training with a focus on functional mobility and awareness of R with use of walker, balance, use of RUE with dressing. Pt initially had difficulty with sit to stand as R foot too far in front and needed cues for foot placement. Pt ambulated into bathroom, when turning his walker to turn R had a near LOB with mod A to stabilize as pt had R foot too close to midline and leaned R. Transferred to shower with cue to release R hand from walker prior to sitting. Improved dynamic sit and stand balance in shower. Transferred to seat in BR to don underwear, again needing cues to be aware to remove R hand from walker splint first. Sat in arm chair in room to complete dressing. Reviewed safety with sitting down in chair that is not braced against wall to prevent chair from sliding.  Donned underwear and pants over feet and actively used R grasp to hold pants and was even able to pull pants up to tops of thighs using RUE.  Once dressing and self care completed, pt worked on self ROM exercises and tone reduction rotation movement using trunk and shoulder. Pt completed each ex 10 x and encouraged to work on them in his room. Pt transferred to w/c using RW with much  improved sit >< stand control and transfer control.  Nurse arrived to his room.  Therapy Documentation Precautions:  Precautions Precautions: Fall Precaution Comments: NPO Restrictions Weight Bearing Restrictions: No    Pain: Pain Assessment Pain Assessment: No/denies pain ADL: ADL ADL Comments: refer to FIM  See FIM for current functional status  Therapy/Group: Individual Therapy  SAGUIER,JULIA 02/22/2015, 11:44 AM

## 2015-02-22 NOTE — Progress Notes (Addendum)
Patient has not any tube feeds since this weekend and only 600cc water on Sun and Monday. Panda came out eraly yesterday evening.  He's dehydrated with acute on chronic renal failure. He hyperkalemic likely due to renal issues as well as lisinopril as K+ has been on upward steady trend. .Will d/c this and start IVF for hydration. Administer dose of Kayexalate and recheck Lytes later this afternoon and in am.   Patient refuses to have panda replaced. MBS done this am and he is to start modified diet later this afternoon with ST. Discussed with patient, ST, Dietician and RN.  We discussed concerns about getting adequate nutrition as well as need for alternative source of nutrition (panda v/s PEG if needed).  Patient feels that he will be able to take enough orally and will not require any other means.  Will start calorie count.

## 2015-02-23 ENCOUNTER — Inpatient Hospital Stay (HOSPITAL_COMMUNITY): Payer: BC Managed Care – PPO

## 2015-02-23 ENCOUNTER — Ambulatory Visit (HOSPITAL_COMMUNITY): Payer: BC Managed Care – PPO | Admitting: Speech Pathology

## 2015-02-23 ENCOUNTER — Inpatient Hospital Stay (HOSPITAL_COMMUNITY): Payer: BC Managed Care – PPO | Admitting: Occupational Therapy

## 2015-02-23 LAB — GLUCOSE, CAPILLARY
GLUCOSE-CAPILLARY: 256 mg/dL — AB (ref 65–99)
Glucose-Capillary: 97 mg/dL (ref 65–99)
Glucose-Capillary: 123 mg/dL — ABNORMAL HIGH (ref 65–99)
Glucose-Capillary: 323 mg/dL — ABNORMAL HIGH (ref 65–99)

## 2015-02-23 LAB — BASIC METABOLIC PANEL
Anion gap: 7 (ref 5–15)
BUN: 27 mg/dL — ABNORMAL HIGH (ref 6–20)
CHLORIDE: 108 mmol/L (ref 101–111)
CO2: 26 mmol/L (ref 22–32)
Calcium: 8.3 mg/dL — ABNORMAL LOW (ref 8.9–10.3)
Creatinine, Ser: 1.26 mg/dL — ABNORMAL HIGH (ref 0.61–1.24)
GFR calc non Af Amer: 59 mL/min — ABNORMAL LOW (ref >60)
GLUCOSE: 100 mg/dL — AB (ref 65–99)
Potassium: 4.3 mmol/L (ref 3.5–5.1)
SODIUM: 141 mmol/L (ref 135–145)

## 2015-02-23 MED ORDER — INSULIN ASPART 100 UNIT/ML ~~LOC~~ SOLN
0.0000 [IU] | Freq: Three times a day (TID) | SUBCUTANEOUS | Status: DC
  Administered 2015-02-23: 2 [IU] via SUBCUTANEOUS
  Administered 2015-02-23: 8 [IU] via SUBCUTANEOUS
  Administered 2015-02-24: 3 [IU] via SUBCUTANEOUS
  Administered 2015-02-24 – 2015-02-25 (×3): 5 [IU] via SUBCUTANEOUS
  Administered 2015-02-25 – 2015-02-27 (×5): 3 [IU] via SUBCUTANEOUS
  Administered 2015-02-27: 2 [IU] via SUBCUTANEOUS
  Administered 2015-02-28 (×2): 3 [IU] via SUBCUTANEOUS
  Administered 2015-03-01: 5 [IU] via SUBCUTANEOUS
  Administered 2015-03-02: 2 [IU] via SUBCUTANEOUS

## 2015-02-23 MED ORDER — SODIUM CHLORIDE 0.45 % IV SOLN
INTRAVENOUS | Status: DC
  Administered 2015-02-23: 1000 mL via INTRAVENOUS

## 2015-02-23 MED ORDER — TIZANIDINE HCL 2 MG PO TABS
2.0000 mg | ORAL_TABLET | Freq: Three times a day (TID) | ORAL | Status: DC
  Administered 2015-02-23 – 2015-03-03 (×24): 2 mg
  Filled 2015-02-23 (×31): qty 1

## 2015-02-23 MED ORDER — LOSARTAN POTASSIUM 25 MG PO TABS
25.0000 mg | ORAL_TABLET | Freq: Every day | ORAL | Status: DC
  Administered 2015-02-23 – 2015-03-03 (×9): 25 mg via ORAL
  Filled 2015-02-23 (×12): qty 1

## 2015-02-23 MED ORDER — INSULIN ASPART 100 UNIT/ML ~~LOC~~ SOLN
0.0000 [IU] | Freq: Three times a day (TID) | SUBCUTANEOUS | Status: DC
Start: 2015-02-23 — End: 2015-02-23

## 2015-02-23 MED ORDER — INSULIN ASPART 100 UNIT/ML ~~LOC~~ SOLN
0.0000 [IU] | Freq: Every day | SUBCUTANEOUS | Status: DC
  Administered 2015-02-23 – 2015-02-26 (×2): 4 [IU] via SUBCUTANEOUS
  Administered 2015-02-27 – 2015-03-02 (×3): 2 [IU] via SUBCUTANEOUS

## 2015-02-23 MED ORDER — RESOURCE THICKENUP CLEAR PO POWD
ORAL | Status: DC | PRN
  Filled 2015-02-23 (×2): qty 125

## 2015-02-23 MED ORDER — GLUCERNA SHAKE PO LIQD: 237.0000 mL | Freq: Two times a day (BID) | ORAL | Status: DC

## 2015-02-23 NOTE — Progress Notes (Signed)
Speech Language Pathology Daily Session Note  Patient Details  Name: Russell Austin MRN: [id number] Date of Birth: Jul 28, 1951  Today's Date: 02/23/2015 SLP Individual Time: 1300-1330 SLP Individual Time Calculation (min): 30 min  Short Term Goals: Week 2: SLP Short Term Goal 1 (Week 2): Pt will demonstrate minimal overt s/s aspiration with trials of ice chips with mod I use of swallowing precautions.  SLP Short Term Goal 1 - Progress (Week 2): Updated due to goal met SLP Short Term Goal 2 (Week 2): Pt will perform pharyngeal strengthening exercises with mod I SLP Short Term Goal 3 (Week 2): Pt will consume dys 1 textures and nectar thick liquids with mod I use of swallowing precautions.    Skilled Therapeutic Interventions:  Pt was seen for skilled ST targeting dysphagia goals.  Upon arrival, pt was seated upright in wheelchair, awake, alert, and agreeable to participate in [place].  Pt consumed presentations of his currently prescribed diet with no overt s/s of aspiration and utilized his recommended swallowing precautions with supervision.  Per report, pt has been consuming 100% of his meals since initiation of PO diet yesterday afternoon and has been consistently using his compensatory swallowing strategies.  Pt may be able to be downgraded to intermittent supervision during meals over the next 1-2 days if he continues to demonstrate good toleration of his diet.  Continue per current plan of care.     FIM:  Comprehension Comprehension Mode: Auditory Comprehension: 5-Follows basic conversation/direction: With extra time/assistive device Expression Expression Mode: Verbal Expression: 6-Expresses complex ideas: With extra time/assistive device Social Interaction Social Interaction: 6-Interacts appropriately with others with medication or extra time (anti-anxiety, antidepressant). Problem Solving Problem Solving: 5-Solves complex 90% of the time/cues < 10% of the time Memory Memory:  6-More than reasonable amt of time FIM - Eating Eating Activity: 5: Supervision/cues  Pain Pain Assessment Pain Assessment: No/denies pain  Therapy/Group: Individual Therapy  Isaia Hassell, Selinda Orion 02/23/2015, 4:07 PM

## 2015-02-23 NOTE — Progress Notes (Signed)
Occupational Therapy Weekly Progress Note  Patient Details  Name: Russell Austin MRN: [id number] Date of Birth: 11/06/1950  Beginning of progress report period: February 15, 2015 End of progress report period: February 23, 2015  Today's Date: 02/23/2015 OT Individual Time: 0802-0917 OT Individual Time Calculation (min): 75 min    Patient has met 5 of 5 short term goals.  Pt has been making excellent progress with OT. He has progressed from mod A to min A with transfers and LB self care. Supervision with UB self care.   Patient continues to demonstrate the following deficits: R hemiparesis and hypertone, decreased trunk control and balance, R inattention and therefore will continue to benefit from skilled OT intervention to enhance overall performance with BADL.  Patient progressing toward long term goals..  Continue plan of care.  OT Short Term Goals Week 1:  OT Short Term Goal 1 (Week 1): Pt will be able to don LB clothing with min A. OT Short Term Goal 1 - Progress (Week 1): Met OT Short Term Goal 2 (Week 1): Pt will complete a toilet transfer with S. OT Short Term Goal 2 - Progress (Week 1): Met (using a squat pivot only with S) OT Short Term Goal 3 (Week 1): Pt will be able to bathe with S. OT Short Term Goal 3 - Progress (Week 1): Met (using long handled sponge) OT Short Term Goal 4 (Week 1): Pt will be able to use his R arm to wash his L arm with min A. OT Short Term Goal 4 - Progress (Week 1): Met OT Short Term Goal 5 (Week 1): Pt will be able to stand with steadying A as he washes his bottom in shower. OT Short Term Goal 5 - Progress (Week 1): Met Week 2:  OT Short Term Goal 1 (Week 2): Pt will transfer to toilet using RW with steadying A. OT Short Term Goal 2 (Week 2): Pt will don LB clothing with S. OT Short Term Goal 3 (Week 2): Pt will use R arm to wash L arm without A. OT Short Term Goal 4 (Week 2): Pt will be able to stand in the shower with S using grab bars for  support.  Skilled Therapeutic Interventions/Progress Updates:    Pt seen for  BADL retraining including eating with supervision. Pt focused on small bites, intermittent throat clearing, and 4-5 swallows without cuing.  Pt completed his entire breakfast and then sat in w/c at sink to brush teeth using R hand as a stabilizer to hold toothpaste tube and to hold toothbrush to apply paste. Pt transferred w/c to bed with S using squat pivot. From EOB, pt engaged in RUE NMR with R wt bearing on reaching. A/Arom of R shoulder and elbow focusing on controlled motion and scapular depression to prevent shoulder hiking. Tone reduction with trunk rotation and prolonged stretching. Pt transferred back to w/c. Nurse arrived to room.   Therapy Documentation Precautions:  Precautions Precautions: Fall Precaution Comments: NPO Restrictions Weight Bearing Restrictions: No    Vital Signs: Therapy Vitals Temp: 97.7 F (36.5 C) Temp Source: Oral Pulse Rate: 78 Resp: 18 BP: (!) 166/66 mmHg Patient Position (if appropriate): Lying Oxygen Therapy SpO2: 100 % O2 Device: Not Delivered Pain: Pain Assessment Pain Assessment: No/denies pain ADL: ADL ADL Comments: refer to FIM    See FIM for current functional status  Therapy/Group: Individual Therapy  SAGUIER,JULIA 02/23/2015, 9:31 AM   

## 2015-02-23 NOTE — Progress Notes (Signed)
Recreational Therapy Session Note  Patient Details  Name: Russell Austin MRN: [id number] Date of Birth: May 25, 1951 Today's Date: 02/23/2015  Pt participated in animal assisted activity seated w/c level with Mod I.  Pt incorporated RUE into task with Mod I.  [name] 02/23/2015, 3:42 PM

## 2015-02-23 NOTE — Progress Notes (Signed)
Subjective/Complaints: Past modified barium swallow now on nectar liquids by teaspoon along with pured solids. Elevated BUN/creatinine yesterday, improved today Patient states that he has a good appetite, 8 100% of breakfast and drank 240 cc of fluid Blood pressure creeping up on continuous normal saline ROS- No CP or SOB, no abdominal pain nausea or vomiting, weakness on the right side Objective: Vital Signs: Blood pressure 166/66, pulse 78, temperature 97.7 F (36.5 C), temperature source Oral, resp. rate 18, weight 78.8 kg (173 lb 11.6 oz), SpO2 100 %. Dg Swallowing Func-speech Pathology  02/22/2015    Objective Swallowing Evaluation:    Patient Details  Name: ZAYDIN BILLEY MRN: [id number] Date of Birth: 1951/05/31  Today's Date: 02/22/2015 Time: SLP Start Time: 0930-SLP Stop Time: 1000 SLP Time Calculation (min) (ACUTE ONLY): 30 min  Past Medical History:  Past Medical History  Diagnosis Date  . Diabetes mellitus type II   . Hypertension   . ED (erectile dysfunction)   . Diabetic retinopathy FOLLOWED BY DR Zadie Rhine  . 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 Glaucoma of both eyes   . Left hydrocele    Past Surgical History:  Past Surgical History  Procedure Laterality Date  . Undescended right testicle removed  1994  . Right eye vitrectomy/ insertion glaucoma seton/ laser repair  12-13-2008     RETINAL ARTERY OCCLUSION /NEOVASCULAR GLAUCOMA/ HEMORRHAGE  . Right eye vitretomy/ insertion glaucoma seton x2/ laser  03-24-2009    RECURRENT HEMORRHAGE/ OCCLUSION INTERNAL SETON  . Left eye laser retina repair  SEPT 2012  . Appendectomy  AGE EARLY 20'S  . Hydrocele excision  03/31/2012    Procedure: HYDROCELECTOMY ADULT;  Surgeon: Franchot Gallo, MD;   Location: [facility];  Service: Urology;  Laterality:  Left;  80 MINS     HPI:  Other Pertinent Information: 64 yo male with h/o HTN, DM2, neuropathy,  CKD, blind- admitted after colonscopy with dysphagia and right  hand/arm  numbness.  Pt failed an RNSSS.  Per MRI pt with left brainstem  nonhemorrhagic cva impacting medulla/pons.  Clinical decision made to  forgo BSE and proceed with MBS due to brainstem cva.  Pt remained NPO per  severe pharyngeal and cervical esophageal dysphagia.  Repeat MBS ordered  today to determine readiness for initiation of PO diet per clinical  improvements noted at bedside.     Assessment / Plan / Recommendation CHL IP CLINICAL IMPRESSIONS 02/22/2015  Therapy Diagnosis Severe pharyngeal phase dysphagia;Severe cervical  esophageal phase dysphagia  Clinical Impression Pt continues to present with a severe pharyngeal and  cervical esophageal dysphagia. Pt presents with slight improvements in  swallowing function in comparison to previous study but remains with  significant weakness impacting hyolaryngeal elevation, base of tongue  retraction, epiglottic inversion, and  pharyngeal constriction. The  abovementioned deficits, in addition to decreased pharyngeal sensation,  resulted in delayed swallow initiation with swallow response triggered at  the level of the pyriforms consistently across all textures and  viscosities.  Significantly weakened hyolaryngeal excursion also appeared  to impact UES opening with only small amounts of boluses transiting to the  esophagus each swallow. Severe post swallow residuals remained at the  level of the vallecula and pyriforms across almost all consistencies.   Cues for multiple dry swallows were needed to clear residuals from the  pharynx to prevent aspiration/penetration of materials post swallow.   Postural maneuvers were ineffective for redirecting bolus flow to minimize  residue.  Trials of thin liquids resulted in less severe pharyngeal  residue but were silently penetrated via teaspoon during the swallow due  to deceased laryngeal closure resulting from delayed swallow initiation.   Pt remains at a moderately-severe risk of aspiration; however, recommend   initiation of dys 1 textures and nectar thick liquids via teaspoon with  full supervision for use of the following swallowing precautions: 3-4  swallows per bite/sip, alternate solids and liquids, intermittent throat  clear, small bites/sips, out of bed for meals.  Goal of initiation of  conservative po is for pleasure as well as therapuetic muscle  strengthening.  Recommend longer term non-oral means of nutrition as  primary nutrition source as patient continues to gain pharyngeal strength  with hopeful improvements and/or compensation of deficits.  Pt is  reluctant for use of alternative means of nutrition.   SLP will continue  to follow up for education and to assess toleration of recommended diet.                            SLP Swallow Goals No flowsheet data found.  No flowsheet data found.    CHL IP REASON FOR REFERRAL 02/22/2015  Reason for Referral Objectively evaluate swallowing function     CHL IP ORAL PHASE 02/22/2015                    Oral Phase WFL                                                                  CHL IP PHARYNGEAL PHASE 02/22/2015  Pharyngeal Phase Impaired (see clinical impression summary)                                                                                                                           CHL IP CERVICAL ESOPHAGEAL PHASE 02/22/2015  Cervical Esophageal Phase Impaired        Honey Teaspoon Reduced cricopharyngeal relaxation  Honey Cup Reduced cricopharyngeal relaxation     Nectar Teaspoon Reduced cricopharyngeal relaxation  Nectar Cup Reduced cricopharyngeal relaxation        Thin Teaspoon Reduced cricopharyngeal relaxation  Thin Cup Reduced cricopharyngeal relaxation             No flowsheet data found.         Page, Selinda Orion 02/22/2015, 2:24 PM    Results for orders placed or performed during the hospital encounter of 02/14/15 (from the past 72 hour(s))  Glucose, capillary     Status: Abnormal   Collection Time: 02/20/15 11:36 AM  Result Value Ref Range    Glucose-Capillary 143 (H) 65 - 99 mg/dL  Glucose, capillary  Status: Abnormal   Collection Time: 02/20/15  6:12 PM  Result Value Ref Range   Glucose-Capillary 100 (H) 65 - 99 mg/dL   Comment 1 Document in Chart   Glucose, capillary     Status: Abnormal   Collection Time: 02/20/15 11:03 PM  Result Value Ref Range   Glucose-Capillary 109 (H) 65 - 99 mg/dL  Glucose, capillary     Status: Abnormal   Collection Time: 02/21/15  6:03 AM  Result Value Ref Range   Glucose-Capillary 104 (H) 65 - 99 mg/dL  Glucose, capillary     Status: Abnormal   Collection Time: 02/21/15 11:42 AM  Result Value Ref Range   Glucose-Capillary 138 (H) 65 - 99 mg/dL  Glucose, capillary     Status: Abnormal   Collection Time: 02/21/15  7:00 PM  Result Value Ref Range   Glucose-Capillary 165 (H) 65 - 99 mg/dL  Glucose, capillary     Status: None   Collection Time: 02/21/15 11:40 PM  Result Value Ref Range   Glucose-Capillary 85 65 - 99 mg/dL  Glucose, capillary     Status: Abnormal   Collection Time: 02/22/15  5:59 AM  Result Value Ref Range   Glucose-Capillary 117 (H) 65 - 99 mg/dL  Basic metabolic panel     Status: Abnormal   Collection Time: 02/22/15 10:25 AM  Result Value Ref Range   Sodium 135 135 - 145 mmol/L   Potassium 6.6 (HH) 3.5 - 5.1 mmol/L    Comment: NO VISIBLE HEMOLYSIS REPEATED TO VERIFY CRITICAL RESULT CALLED TO, READ BACK BY AND VERIFIED WITH: S.JENNINGS,RN 1118 02/22/15 CLARK,S    Chloride 102 101 - 111 mmol/L   CO2 26 22 - 32 mmol/L   Glucose, Bld 195 (H) 65 - 99 mg/dL   BUN 31 (H) 6 - 20 mg/dL   Creatinine, Ser 1.50 (H) 0.61 - 1.24 mg/dL   Calcium 8.7 (L) 8.9 - 10.3 mg/dL   GFR calc non Af Amer 48 (L) >60 mL/min   GFR calc Af Amer 55 (L) >60 mL/min    Comment: (NOTE) The eGFR has been calculated using the CKD EPI equation. This calculation has not been validated in all clinical situations. eGFR's persistently <60 mL/min signify possible Chronic Kidney Disease.    Anion gap  7 5 - 15  Glucose, capillary     Status: Abnormal   Collection Time: 02/22/15 12:02 PM  Result Value Ref Range   Glucose-Capillary 188 (H) 65 - 99 mg/dL  Basic metabolic panel     Status: Abnormal   Collection Time: 02/22/15  4:20 PM  Result Value Ref Range   Sodium 139 135 - 145 mmol/L   Potassium 5.1 3.5 - 5.1 mmol/L    Comment: NO VISIBLE HEMOLYSIS   Chloride 103 101 - 111 mmol/L   CO2 25 22 - 32 mmol/L   Glucose, Bld 175 (H) 65 - 99 mg/dL   BUN 33 (H) 6 - 20 mg/dL   Creatinine, Ser 1.40 (H) 0.61 - 1.24 mg/dL   Calcium 8.7 (L) 8.9 - 10.3 mg/dL   GFR calc non Af Amer 52 (L) >60 mL/min   GFR calc Af Amer >60 >60 mL/min    Comment: (NOTE) The eGFR has been calculated using the CKD EPI equation. This calculation has not been validated in all clinical situations. eGFR's persistently <60 mL/min signify possible Chronic Kidney Disease.    Anion gap 11 5 - 15  Glucose, capillary     Status: Abnormal  Collection Time: 02/22/15  4:41 PM  Result Value Ref Range   Glucose-Capillary 187 (H) 65 - 99 mg/dL  Glucose, capillary     Status: Abnormal   Collection Time: 02/22/15 11:49 PM  Result Value Ref Range   Glucose-Capillary 204 (H) 65 - 99 mg/dL  Glucose, capillary     Status: None   Collection Time: 02/23/15  6:15 AM  Result Value Ref Range   Glucose-Capillary 97 65 - 99 mg/dL  Basic metabolic panel     Status: Abnormal   Collection Time: 02/23/15  7:40 AM  Result Value Ref Range   Sodium 141 135 - 145 mmol/L   Potassium 4.3 3.5 - 5.1 mmol/L   Chloride 108 101 - 111 mmol/L   CO2 26 22 - 32 mmol/L   Glucose, Bld 100 (H) 65 - 99 mg/dL   BUN 27 (H) 6 - 20 mg/dL   Creatinine, Ser 1.26 (H) 0.61 - 1.24 mg/dL   Calcium 8.3 (L) 8.9 - 10.3 mg/dL   GFR calc non Af Amer 59 (L) >60 mL/min   GFR calc Af Amer >60 >60 mL/min    Comment: (NOTE) The eGFR has been calculated using the CKD EPI equation. This calculation has not been validated in all clinical situations. eGFR's persistently  <60 mL/min signify possible Chronic Kidney Disease.    Anion gap 7 5 - 15     HEENT: normal Cardio: RRR and no murmur Resp: CTA B/L and unlabored GI: BS positive and NT, ND Extremity:  Pulses positive and No Edema Skin:   Intact Neuro: Alert/Oriented Right upper extremity 3 minus at the deltoid, biceps, triceps, grip 3 minus at the hip flexor on the right 4 minus knee extensor   Assessment/Plan: 1. 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 Physiatrist is providing close team supervision and 24 hour management of active medical problems listed below. Physiatrist and rehab team continue to assess barriers to discharge/monitor patient progress toward functional and medical goals.  FIM: FIM - Bathing Bathing Steps Patient Completed: Chest, Abdomen, Front perineal area, Right upper leg, Left upper leg, Right lower leg (including foot), Left lower leg (including foot), Buttocks, Right Arm Bathing: 4: Min-Patient completes 8-9 55f10 parts or 75+ percent  FIM - Upper Body Dressing/Undressing Upper body dressing/undressing steps patient completed: Thread/unthread left sleeve of pullover shirt/dress, Pull shirt over trunk, Put head through opening of pull over shirt/dress, Thread/unthread right sleeve of pullover shirt/dresss Upper body dressing/undressing: 5: Set-up assist to: Obtain clothing/put away FIM - Lower Body Dressing/Undressing Lower body dressing/undressing steps patient completed: Thread/unthread right underwear leg, Thread/unthread left underwear leg, Pull underwear up/down, Thread/unthread right pants leg, Thread/unthread left pants leg, Pull pants up/down, Don/Doff left sock, Don/Doff left shoe, Fasten/unfasten right shoe, Fasten/unfasten left shoe Lower body dressing/undressing: 4: Min-Patient completed 75 plus % of tasks  FIM -  Toileting Toileting steps completed by patient: Adjust clothing prior to toileting Toileting Assistive Devices: Grab bar or rail for support Toileting: 4: Steadying assist  FIM - TRadio producerDevices: GProduct managerTransfers: 4-To toilet/BSC: Min A (steadying Pt. > 75%), 4-From toilet/BSC: Min A (steadying Pt. > 75%)  FIM - Bed/Chair Transfer Bed/Chair Transfer Assistive Devices: Arm rests Bed/Chair Transfer: 4: Chair or W/C > Bed: Min A (steadying Pt. > 75%), 4: Bed > Chair or W/C: Min A (steadying Pt. > 75%)  FIM - Locomotion: Wheelchair Distance: 150 Locomotion: Wheelchair: 6: Travels 150 ft or more, turns around, maneuvers to table, bed or toilet, negotiates 3% grade: maneuvers on rugs and over door sills independently FIM - Locomotion: Ambulation Locomotion: Ambulation Assistive Devices: Nurse, adult Ambulation/Gait Assistance: 4: Min assist Locomotion: Ambulation: 1: Travels less than 50 ft with minimal assistance (Pt.>75%)  Comprehension Comprehension Mode: Auditory Comprehension: 5-Follows basic conversation/direction: With extra time/assistive device  Expression Expression Mode: Verbal Expression: 6-Expresses complex ideas: With extra time/assistive device  Social Interaction Social Interaction: 6-Interacts appropriately with others with medication or extra time (anti-anxiety, antidepressant).  Problem Solving Problem Solving: 5-Solves complex 90% of the time/cues < 10% of the time  Memory Memory: 6-More than reasonable amt of time  Medical Problem List and Plan: 1. 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  DVT Prophylaxis/Anticoagulation: Pharmaceutical: Lovenox 3. Pain Management: N/A 4. Mood: LCSW to follow for evaluation and support. Has good family support.   5. Neuropsych: This patient is capable of making decisions on his own behalf. 6. Skin/Wound Care: Routine  pressure relief measures.   7. Fluids/Electrolytes/Nutrition:now on oral feeding, monitor calories, monitor fluid intake, monitor basic metabolic, IV fluids at night 8. HTN: Monitor BP every 8 hours. off Prinivil because of acute renal failure which was also influenced by inadequate fluid intake  9. Severe dysphagia: NPO. Will have dietician assist with nutritional needs as complaining of hunger--had colonoscopy last Wed.       10. DM type 2: Hgb A1C-7.6.fair control Was on metformin 1000 mg bid with Glucotrol XL 10 mg daily. Now on Lantus due to continuous tube feeds.   11. CKD: Baseline Cr- 1.3.  Creat at baseline, given elevated BUN/creatinine will hold off on HCTZ, if BP up consider Norvasc 12. Dyslipidemia: continue Lipitor.     LOS (Days) 9 A FACE TO FACE EVALUATION WAS PERFORMED  Azaela Caracci E 02/23/2015, 10:07 AM

## 2015-02-23 NOTE — Progress Notes (Signed)
Nutrition Follow-up  DOCUMENTATION CODES:  Not applicable  INTERVENTION:  72 hour Calorie count initiated.  Glucerna shake (BID), each supplement provides 220 kcal and 10 grams of protein.  Encourage adequate PO intake.   NUTRITION DIAGNOSIS:  Inadequate oral intake related to inability to eat as evidenced by NPO status; just advanced to diet; improving  GOAL:  Patient will meet greater than or equal to 90% of their needs; progressing  MONITOR:  PO intake, Supplement acceptance, Weight trends, Labs, I & O's  REASON FOR ASSESSMENT:  Consult Calorie Count  ASSESSMENT: Pt with history of DM type 2 with retinopathy, HTN, glaucoma, who was admitted on 02/09/15 with reports of heaviness of RUE/RLE with intermittent numbness, pins and needles. 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   NGT out. Pt has been advanced to a dysphagia 1 diet with nectar thick liquids. Meal completion has been 100%. Pt reports having a good appetite. Pt is agreeable to Glucerna Shake to aid in adequate calorie and protein needs. RD to order. 72 hour calorie count additionally has been initiated. RD to follow up tomorrow 6/23 for day 1 results.   Labs: Low calcium and GFR. High BUN and creatinine.   Height:  Ht Readings from Last 1 Encounters:  02/09/15 5\' 9"  (1.753 m)    Weight:  Wt Readings from Last 1 Encounters:  02/23/15 173 lb 11.6 oz (78.8 kg)    Ideal Body Weight:  72.7 kg  Wt Readings from Last 10 Encounters:  02/23/15 173 lb 11.6 oz (78.8 kg)  02/14/15 155 lb 13.8 oz (70.7 kg)  12/20/14 182 lb (82.555 kg)  03/18/14 184 lb (83.462 kg)  12/24/13 184 lb (83.462 kg)  05/13/13 183 lb (83.008 kg)  10/30/12 182 lb (82.555 kg)  07/17/12 185 lb (83.915 kg)  03/26/12 185 lb (83.915 kg)  07/02/11 184 lb (83.462 kg)    BMI:  Body mass index is 25.64 kg/(m^2).  Estimated Nutritional  Needs:  Kcal:  1900-2100  Protein:  90-100 grams  Fluid:  1.9 - 2.1 L/day  Skin:  Reviewed, no issues  Diet Order:  DIET - DYS 1 Room service appropriate?: Yes; Fluid consistency:: Nectar Thick  EDUCATION NEEDS:  No education needs identified at this time   Intake/Output Summary (Last 24 hours) at 02/23/15 1251 Last data filed at 02/23/15 1019  Gross per 24 hour  Intake 2456.67 ml  Output    750 ml  Net 1706.67 ml    Last BM:  6/21  Corrin Parker, MS, RD, LDN Pager # [phone] After hours/ weekend pager # [phone]

## 2015-02-23 NOTE — Progress Notes (Signed)
Physical Therapy Session Note  Patient Details  Name: CASIMIR GRISSETT MRN: [id number] Date of Birth: 23-Jul-1951  Today's Date: 02/23/2015 PT Individual Time: 1000-1030, 1405-1505 PT Individual Time Calculation (min): 30 min , 60 min  Short Term Goals: Week 2:  PT Short Term Goal 1 (Week 2): = LTG of MOD I overall    Skilled Therapeutic Interventions/Progress Updates:  tx 1:  neuromuscular re-education via demo, VCS, tactile cues for alternating reciprocal movements in supported sitting: hip flexion, knee ext with ankle pumps at end range, toe taps, toe raises; in supported standing: mini squats, toe raises. Pt limited by lack of sensation RLE.  tx 2:  Pt modified independent on unit to propel w/c to PT.   neuromuscular re-education via forced use, manual cues, visual cues for: altternating R/L LE movements on NuStep at level 4 x 8 minutes without use of UEs, RLE step ups for knee control especially eccentric. Gait using hall way railing, [facility] with min assist x 25' forward/backward, focusing on upright trunk, forward gaze, R step length=L step length, wider BOS.  Gait without AD, PT in front of pt with bil UE support, with mod assist for balance due to difficulty wt shifting for safe R foot placement.  Gait up/down steps with 2 rails, moving R hand along railing with cues, step through when ascending 3" and 7" steps, , step to descending 7" steps, step through descending 3" steps for eccentric challenge RLe.  Therapeutic activity in standing, kicking beach ball with RLE focusing on hamstring curl, static>dynamic ball, with excellent activation and elevation of foot with knee flexion.    Pt returned to room, modified independent. Therapy Documentation Precautions:  Precautions Precautions: Fall Precaution Comments: NPO Restrictions Weight Bearing Restrictions: No      See FIM for current functional status  Therapy/Group: Individual Therapy  Raynelle Fujikawa 02/23/2015, 2:37  PM

## 2015-02-24 ENCOUNTER — Inpatient Hospital Stay (HOSPITAL_COMMUNITY): Payer: BC Managed Care – PPO | Admitting: Occupational Therapy

## 2015-02-24 ENCOUNTER — Inpatient Hospital Stay (HOSPITAL_COMMUNITY): Payer: BC Managed Care – PPO

## 2015-02-24 ENCOUNTER — Inpatient Hospital Stay (HOSPITAL_COMMUNITY): Payer: BC Managed Care – PPO | Admitting: Speech Pathology

## 2015-02-24 LAB — GLUCOSE, CAPILLARY
GLUCOSE-CAPILLARY: 157 mg/dL — AB (ref 65–99)
Glucose-Capillary: 75 mg/dL (ref 65–99)
Glucose-Capillary: 187 mg/dL — ABNORMAL HIGH (ref 65–99)
Glucose-Capillary: 210 mg/dL — ABNORMAL HIGH (ref 65–99)

## 2015-02-24 MED ORDER — GLUCERNA SHAKE PO LIQD
237.0000 mL | Freq: Every day | ORAL | Status: DC
  Administered 2015-02-25 – 2015-03-01 (×5): 237 mL via ORAL

## 2015-02-24 NOTE — Progress Notes (Signed)
Physical Therapy Session Note  Patient Details  Name: Russell Austin MRN: [id number] Date of Birth: June 06, 1951  Today's Date: 02/24/2015 PT Individual Time: 1301-1400 PT Individual Time Calculation (min): 59 min   Short Term Goals: Week 2:  PT Short Term Goal 1 (Week 2): = LTG of MOD I overall for D/C 03/01/15  Skilled Therapeutic Interventions/Progress Updates:    Patient in wheelchair able to propel independently to gym then back to room.  Patient sit to stand supervision and ambulated with RW without hand splint 100'.  Cues for right hand on walker, for right hip extension for decreased knee hyperextension. Attempted with AFO off the shelf for improved knee control, but not high enough on post aspect of leg.  Work on knee control sit to squat and stand to squat forced use on right left on 4" step with mod support.  Patient in quadruped elbows on bench for alt hip extension, then tall kneeling left "fire hydrant" for right hip strength in stance.  Then standing hip/knee ankle stability without excessive hyperextension/plantarflexion (extensor tone) with toes on edge of balance beam stance stabilization on right for side taps up to 4" step with left mod support for balance and right LE position/stabilization.  Then stance on right stepping over and back beam with left assist for keeping knee in flexion.  Gait x 75' with RW min guard cues for step length and right knee/hip control.  Pt independent back to room in wheelchair.  Therapy Documentation Precautions:  Precautions Precautions: Fall Precaution Comments: Dys 1 diet, nectar thick Restrictions Weight Bearing Restrictions: No Pain: Pain Assessment Pain Assessment: Faces Faces Pain Scale: Hurts even more Pain Type: Acute pain Pain Location: Shoulder Pain Orientation: Right Pain Descriptors / Indicators: Tightness Pain Onset: With Activity Pain Intervention(s): Repositioned;Heat applied 2nd Pain Site Wong-Baker Pain Rating:  Hurts even more Pain Type: Acute pain Pain Location: Leg Pain Orientation: Right Pain Descriptors / Indicators: Cramping Pain Onset: With Activity Pain Intervention(s): Elevated extremity;Heat applied Locomotion : Ambulation Ambulation/Gait Assistance: 4: Min assist   See FIM for current functional status  Therapy/Group: Individual Therapy  Schleicher, [place] 02/24/2015  02/24/2015, 4:45 PM

## 2015-02-24 NOTE — Progress Notes (Signed)
Occupational Therapy Session Note  Patient Details  Name: Russell Austin MRN: [id number] Date of Birth: [birth year]  Today's Date: 02/24/2015 OT Individual Time: 1400-1500 OT Individual Time Calculation (min): 60 min    Short Term Goals: Week 2:  OT Short Term Goal 1 (Week 2): Pt will transfer to toilet using RW with steadying A. OT Short Term Goal 2 (Week 2): Pt will don LB clothing with S. OT Short Term Goal 3 (Week 2): Pt will use R arm to wash L arm without A. OT Short Term Goal 4 (Week 2): Pt will be able to stand in the shower with S using grab bars for support.  Skilled Therapeutic Interventions/Progress Updates:   Upon entering the room, pt seated in wheelchair with head applied to R shoulder and R LE. Pt reporting, "I'm good now. Let's go." Pt propelled self to day room via hemiplegic technique and supervision. Pt engaged in card game while seated in wheelchair with use of R UE to obtain cards, flip cards over, and hold card for selection. Therapist provided assist in order to decrease compensatory movements in R shoulder with use during reaching. OT educated and demonstrated R hand Harrison exercises as well as provided pt with red, medium soft theraputty for resistive exercises in order to strengthen hand. Pt returned demonstration with min verbal cues for proper technique and took putty to room at end of session to work on during free time. Pt engaged in controlled movements with use of UE ranger for internal/external rotation and shoulder flexion. Pt provided with target of where pt must reach to as well as given cues to stop during movement in order to demonstrate control. Pt did very well with exercises and did require decreased distractions secondary to the amount of focus required for task. Therapist assisted pt back to room via wheelchair for time management. Pt seated in wheelchair with call bell and all needed items within reach upon exiting the room.   Therapy  Documentation Precautions:  Precautions Precautions: Fall Precaution Comments: Dys 1 diet, nectar thick Restrictions Weight Bearing Restrictions: No Vital Signs: Therapy Vitals Temp: 98.4 F (36.9 C) Temp Source: Oral Pulse Rate: 77 Resp: 18 BP: 137/63 mmHg Patient Position (if appropriate): Sitting Oxygen Therapy SpO2: 99 % O2 Device: Not Delivered ADL: ADL ADL Comments: refer to FIM  See FIM for current functional status  Therapy/Group: Individual Therapy  Phineas Semen 02/24/2015, 4:52 PM

## 2015-02-24 NOTE — Progress Notes (Signed)
Speech Language Pathology Daily Session Note  Patient Details  Name: Russell Austin MRN: [id number] Date of Birth: 02-Apr-1951  Today's Date: 02/24/2015 SLP Individual Time: 0800-0830 SLP Individual Time Calculation (min): 30 min  Short Term Goals: Week 2: SLP Short Term Goal 1 (Week 2): Pt will demonstrate minimal overt s/s aspiration with trials of ice chips with mod I use of swallowing precautions.  SLP Short Term Goal 1 - Progress (Week 2): Updated due to goal met SLP Short Term Goal 2 (Week 2): Pt will perform pharyngeal strengthening exercises with mod I SLP Short Term Goal 3 (Week 2): Pt will consume dys 1 textures and nectar thick liquids with mod I use of swallowing precautions.    Skilled Therapeutic Interventions:  Pt was seen for skilled ST targeting cognitive goals.  Upon arrival, pt was seated upright in wheelchair, awake, alert, and agreeable to participate in [place].  Pt consumed presentations of his currently prescribed diet with supervision cues for and extra time for use of swallowing precautions.   No overt s/s of aspiration were evident during the entire therapy session.  Pt's voice remained clear and strong throughout PO intake.  Recommend that pt be upgraded to intermittent supervision during meals versus full supervision due to appropriate carryover of safe swallowing strategies.  Continue per current plan of care.    FIM:  Comprehension Comprehension Mode: Auditory Comprehension: 5-Follows basic conversation/direction: With extra time/assistive device Expression Expression Mode: Verbal Expression: 6-Expresses complex ideas: With extra time/assistive device Social Interaction Social Interaction: 6-Interacts appropriately with others with medication or extra time (anti-anxiety, antidepressant). Problem Solving Problem Solving: 5-Solves complex 90% of the time/cues < 10% of the time Memory Memory: 6-More than reasonable amt of time FIM - Eating Eating Activity:  5: Supervision/cues  Pain Pain Assessment Pain Assessment: No/denies pain  Therapy/Group: Individual Therapy  Purva Vessell, Selinda Orion 02/24/2015, 10:44 AM

## 2015-02-24 NOTE — Progress Notes (Signed)
Subjective/Complaints: Increasing tone LUE>LLE ROS- No CP or SOB, no abdominal pain nausea or vomiting, weakness on the right side Objective: Vital Signs: Blood pressure 137/71, pulse 72, temperature 98 F (36.7 C), temperature source Oral, resp. rate 18, weight 76.749 kg (169 lb 3.2 oz), SpO2 99 %. Dg Swallowing Func-speech Pathology  02/22/2015    Objective Swallowing Evaluation:    Patient Details  Name: Russell Austin MRN: [id number] Date of Birth: [birth year]  Today's Date: 02/22/2015 Time: SLP Start Time: 0930-SLP Stop Time: 1000 SLP Time Calculation (min) (ACUTE ONLY): 30 min  Past Medical History:  Past Medical History  Diagnosis Date  . Diabetes mellitus type II   . Hypertension   . ED (erectile dysfunction)   . Diabetic retinopathy FOLLOWED BY DR Zadie Rhine  . 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 Glaucoma of both eyes   . Left hydrocele    Past Surgical History:  Past Surgical History  Procedure Laterality Date  . Undescended right testicle removed  1994  . Right eye vitrectomy/ insertion glaucoma seton/ laser repair  12-13-2008     RETINAL ARTERY OCCLUSION /NEOVASCULAR GLAUCOMA/ HEMORRHAGE  . Right eye vitretomy/ insertion glaucoma seton x2/ laser  03-24-2009    RECURRENT HEMORRHAGE/ OCCLUSION INTERNAL SETON  . Left eye laser retina repair  SEPT 2012  . Appendectomy  AGE EARLY 20'S  . Hydrocele excision  03/31/2012    Procedure: HYDROCELECTOMY ADULT;  Surgeon: Franchot Gallo, MD;   Location: [facility];  Service: Urology;  Laterality:  Left;  22 MINS     HPI:  Other Pertinent Information: 64 yo male with h/o HTN, DM2, neuropathy,  CKD, blind- admitted after colonscopy with dysphagia and right hand/arm  numbness.  Pt failed an RNSSS.  Per MRI pt with left brainstem  nonhemorrhagic cva impacting medulla/pons.  Clinical decision made to  forgo BSE and proceed with MBS due to brainstem cva.  Pt remained NPO per  severe pharyngeal and cervical esophageal  dysphagia.  Repeat MBS ordered  today to determine readiness for initiation of PO diet per clinical  improvements noted at bedside.     Assessment / Plan / Recommendation CHL IP CLINICAL IMPRESSIONS 02/22/2015  Therapy Diagnosis Severe pharyngeal phase dysphagia;Severe cervical  esophageal phase dysphagia  Clinical Impression Pt continues to present with a severe pharyngeal and  cervical esophageal dysphagia. Pt presents with slight improvements in  swallowing function in comparison to previous study but remains with  significant weakness impacting hyolaryngeal elevation, base of tongue  retraction, epiglottic inversion, and  pharyngeal constriction. The  abovementioned deficits, in addition to decreased pharyngeal sensation,  resulted in delayed swallow initiation with swallow response triggered at  the level of the pyriforms consistently across all textures and  viscosities.  Significantly weakened hyolaryngeal excursion also appeared  to impact UES opening with only small amounts of boluses transiting to the  esophagus each swallow. Severe post swallow residuals remained at the  level of the vallecula and pyriforms across almost all consistencies.   Cues for multiple dry swallows were needed to clear residuals from the  pharynx to prevent aspiration/penetration of materials post swallow.   Postural maneuvers were ineffective for redirecting bolus flow to minimize  residue.   Trials of thin liquids resulted in less severe pharyngeal  residue but were silently penetrated via teaspoon during the swallow due  to deceased laryngeal closure resulting from delayed swallow initiation.   Pt remains at a moderately-severe risk of aspiration;  however, recommend  initiation of dys 1 textures and nectar thick liquids via teaspoon with  full supervision for use of the following swallowing precautions: 3-4  swallows per bite/sip, alternate solids and liquids, intermittent throat  clear, small bites/sips, out of bed for meals.   Goal of initiation of  conservative po is for pleasure as well as therapuetic muscle  strengthening.  Recommend longer term non-oral means of nutrition as  primary nutrition source as patient continues to gain pharyngeal strength  with hopeful improvements and/or compensation of deficits.  Pt is  reluctant for use of alternative means of nutrition.   SLP will continue  to follow up for education and to assess toleration of recommended diet.                            SLP Swallow Goals No flowsheet data found.  No flowsheet data found.    CHL IP REASON FOR REFERRAL 02/22/2015  Reason for Referral Objectively evaluate swallowing function     CHL IP ORAL PHASE 02/22/2015                    Oral Phase WFL                                                                  CHL IP PHARYNGEAL PHASE 02/22/2015  Pharyngeal Phase Impaired (see clinical impression summary)                                                                                                                           CHL IP CERVICAL ESOPHAGEAL PHASE 02/22/2015  Cervical Esophageal Phase Impaired        Honey Teaspoon Reduced cricopharyngeal relaxation  Honey Cup Reduced cricopharyngeal relaxation     Nectar Teaspoon Reduced cricopharyngeal relaxation  Nectar Cup Reduced cricopharyngeal relaxation        Thin Teaspoon Reduced cricopharyngeal relaxation  Thin Cup Reduced cricopharyngeal relaxation             No flowsheet data found.         Page, Selinda Orion 02/22/2015, 2:24 PM    Results for orders placed or performed during the hospital encounter of 02/14/15 (from the past 72 hour(s))  Glucose, capillary     Status: Abnormal   Collection Time: 02/21/15 11:42 AM  Result Value Ref Range   Glucose-Capillary 138 (H) 65 - 99 mg/dL  Glucose, capillary     Status: Abnormal   Collection Time: 02/21/15  7:00 PM  Result Value Ref Range   Glucose-Capillary 165 (H) 65 - 99 mg/dL  Glucose, capillary     Status: None   Collection Time: 02/21/15 11:40 PM  Result  Value Ref Range   Glucose-Capillary 85 65 - 99 mg/dL  Glucose, capillary     Status: Abnormal   Collection Time: 02/22/15  5:59 AM  Result Value Ref Range   Glucose-Capillary 117 (H) 65 - 99 mg/dL  Basic metabolic panel     Status: Abnormal   Collection Time: 02/22/15 10:25 AM  Result Value Ref Range   Sodium 135 135 - 145 mmol/L   Potassium 6.6 (HH) 3.5 - 5.1 mmol/L    Comment: NO VISIBLE HEMOLYSIS REPEATED TO VERIFY CRITICAL RESULT CALLED TO, READ BACK BY AND VERIFIED WITH: S.JENNINGS,RN 1118 02/22/15 CLARK,S    Chloride 102 101 - 111 mmol/L   CO2 26 22 - 32 mmol/L   Glucose, Bld 195 (H) 65 - 99 mg/dL   BUN 31 (H) 6 - 20 mg/dL   Creatinine, Ser 1.50 (H) 0.61 - 1.24 mg/dL   Calcium 8.7 (L) 8.9 - 10.3 mg/dL   GFR calc non Af Amer 48 (L) >60 mL/min   GFR calc Af Amer 55 (L) >60 mL/min    Comment: (NOTE) The eGFR has been calculated using the CKD EPI equation. This calculation has not been validated in all clinical situations. eGFR's persistently <60 mL/min signify possible Chronic Kidney Disease.    Anion gap 7 5 - 15  Glucose, capillary     Status: Abnormal   Collection Time: 02/22/15 12:02 PM  Result Value Ref Range   Glucose-Capillary 188 (H) 65 - 99 mg/dL  Basic metabolic panel     Status: Abnormal   Collection Time: 02/22/15  4:20 PM  Result Value Ref Range   Sodium 139 135 - 145 mmol/L   Potassium 5.1 3.5 - 5.1 mmol/L    Comment: NO VISIBLE HEMOLYSIS   Chloride 103 101 - 111 mmol/L   CO2 25 22 - 32 mmol/L   Glucose, Bld 175 (H) 65 - 99 mg/dL   BUN 33 (H) 6 - 20 mg/dL   Creatinine, Ser 1.40 (H) 0.61 - 1.24 mg/dL   Calcium 8.7 (L) 8.9 - 10.3 mg/dL   GFR calc non Af Amer 52 (L) >60 mL/min   GFR calc Af Amer >60 >60 mL/min    Comment: (NOTE) The eGFR has been calculated using the CKD EPI equation. This calculation has not been validated in all clinical situations. eGFR's persistently <60 mL/min signify possible Chronic Kidney Disease.    Anion gap 11 5 - 15   Glucose, capillary     Status: Abnormal   Collection Time: 02/22/15  4:41 PM  Result Value Ref Range   Glucose-Capillary 187 (H) 65 - 99 mg/dL  Glucose, capillary     Status: Abnormal   Collection Time: 02/22/15 11:49 PM  Result Value Ref Range   Glucose-Capillary 204 (H) 65 - 99 mg/dL  Glucose, capillary     Status: None   Collection Time: 02/23/15  6:15 AM  Result Value Ref Range   Glucose-Capillary 97 65 - 99 mg/dL  Basic metabolic panel     Status: Abnormal   Collection Time: 02/23/15  7:40 AM  Result Value Ref Range   Sodium 141 135 - 145 mmol/L   Potassium 4.3 3.5 - 5.1 mmol/L   Chloride 108 101 - 111 mmol/L   CO2 26 22 - 32 mmol/L   Glucose, Bld 100 (H) 65 - 99 mg/dL   BUN 27 (H) 6 - 20 mg/dL   Creatinine, Ser 1.26 (H) 0.61 - 1.24 mg/dL   Calcium 8.3 (L) 8.9 -  10.3 mg/dL   GFR calc non Af Amer 59 (L) >60 mL/min   GFR calc Af Amer >60 >60 mL/min    Comment: (NOTE) The eGFR has been calculated using the CKD EPI equation. This calculation has not been validated in all clinical situations. eGFR's persistently <60 mL/min signify possible Chronic Kidney Disease.    Anion gap 7 5 - 15  Glucose, capillary     Status: Abnormal   Collection Time: 02/23/15 11:50 AM  Result Value Ref Range   Glucose-Capillary 256 (H) 65 - 99 mg/dL  Glucose, capillary     Status: Abnormal   Collection Time: 02/23/15  4:30 PM  Result Value Ref Range   Glucose-Capillary 123 (H) 65 - 99 mg/dL  Glucose, capillary     Status: Abnormal   Collection Time: 02/23/15 10:23 PM  Result Value Ref Range   Glucose-Capillary 323 (H) 65 - 99 mg/dL   Comment 1 Notify RN   Glucose, capillary     Status: None   Collection Time: 02/24/15  7:03 AM  Result Value Ref Range   Glucose-Capillary 75 65 - 99 mg/dL   Comment 1 Notify RN      HEENT: normal Cardio: RRR and no murmur Resp: CTA B/L and unlabored GI: BS positive and NT, ND Extremity:  Pulses positive and No Edema Skin:   Intact Neuro:  Alert/Oriented Right upper extremity 3 minus at the deltoid, biceps, triceps, grip 3 minus at the hip flexor on the right 4 minus knee extensor MAS 3 at left biceps, pectoralis, 1-2 in finger and wrist flexors  Assessment/Plan: 1. 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 Physiatrist is providing close team supervision and 24 hour management of active medical problems listed below. Physiatrist and rehab team continue to assess barriers to discharge/monitor patient progress toward functional and medical goals. Plan will be for botulinum toxin injection to biceps brachial radialis and pectoralis right FIM: FIM - Bathing Bathing Steps Patient Completed: Chest, Abdomen, Front perineal area, Right upper leg, Left upper leg, Right lower leg (including foot), Left lower leg (including foot), Buttocks, Right Arm Bathing: 4: Min-Patient completes 8-9 [id number] parts or 75+ percent  FIM - Upper Body Dressing/Undressing Upper body dressing/undressing steps patient completed: Thread/unthread left sleeve of pullover shirt/dress, Pull shirt over trunk, Put head through opening of pull over shirt/dress, Thread/unthread right sleeve of pullover shirt/dresss Upper body dressing/undressing: 5: Set-up assist to: Obtain clothing/put away FIM - Lower Body Dressing/Undressing Lower body dressing/undressing steps patient completed: Thread/unthread right underwear leg, Thread/unthread left underwear leg, Pull underwear up/down, Thread/unthread right pants leg, Thread/unthread left pants leg, Pull pants up/down, Don/Doff left sock, Don/Doff left shoe, Fasten/unfasten right shoe, Fasten/unfasten left shoe Lower body dressing/undressing: 4: Min-Patient completed 75 plus % of tasks  FIM - Toileting Toileting steps completed by patient: Adjust clothing prior to toileting,  Performs perineal hygiene, Adjust clothing after toileting Toileting Assistive Devices: Grab bar or rail for support Toileting: 4: Steadying assist  FIM - TRadio producerDevices: Grab bars Toilet Transfers: 4-To toilet/BSC: Min A (steadying Pt. > 75%), 4-From toilet/BSC: Min A (steadying Pt. > 75%)  FIM - Bed/Chair Transfer Bed/Chair Transfer Assistive Devices: Arm rests Bed/Chair Transfer: 4: Chair or W/C > Bed: Min A (steadying Pt. > 75%), 4: Bed > Chair or W/C: Min A (steadying Pt. > 75%)  FIM - Locomotion: Wheelchair  Distance: 150 Locomotion: Wheelchair: 6: Travels 150 ft or more, turns around, maneuvers to table, bed or toilet, negotiates 3% grade: maneuvers on rugs and over door sills independently FIM - Locomotion: Ambulation Locomotion: Ambulation Assistive Devices: Nurse, adult Ambulation/Gait Assistance: 4: Min assist Locomotion: Ambulation: 1: Travels less than 50 ft with minimal assistance (Pt.>75%)  Comprehension Comprehension Mode: Auditory Comprehension: 5-Follows basic conversation/direction: With extra time/assistive device  Expression Expression Mode: Verbal Expression: 6-Expresses complex ideas: With extra time/assistive device  Social Interaction Social Interaction: 6-Interacts appropriately with others with medication or extra time (anti-anxiety, antidepressant).  Problem Solving Problem Solving: 5-Solves complex 90% of the time/cues < 10% of the time  Memory Memory: 6-More than reasonable amt of time  Medical Problem List and Plan: 1. 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  DVT Prophylaxis/Anticoagulation: Pharmaceutical: Lovenox 3. Pain Management: N/A 4. Mood: LCSW to follow for evaluation and support. Has good family support.   5. Neuropsych: This patient is capable of making decisions on his own behalf. 6. Skin/Wound Care: Routine pressure relief measures.    7. Fluids/Electrolytes/Nutrition:now on oral feeding, monitor calories, monitor fluid intake, monitor basic metabolic, IV fluids at night 8. HTN: Monitor BP every 8 hours. off Prinivil because of acute renal failure which was also influenced by inadequate fluid intake  9. Severe dysphagia: NPO. Will have dietician assist with nutritional needs as complaining of hunger--had colonoscopy last Wed.       10. DM type 2: Hgb A1C-7.6.fair control Was on metformin 1000 mg bid with Glucotrol XL 10 mg daily. Now on Lantus due to continuous tube feeds.   11. CKD: Baseline Cr- 1.3.  Creat at baseline, given elevated BUN/creatinine will hold off on HCTZ, if BP up consider Norvasc 12. Dyslipidemia: continue Lipitor.     LOS (Days) 10 A FACE TO FACE EVALUATION WAS PERFORMED  KIRSTEINS,ANDREW E 02/24/2015, 8:47 AM

## 2015-02-24 NOTE — Procedures (Addendum)
Abobotulinumtoxin A Injection for spasticity  Dilution: 200 Units/ml Indication: Severe spasticity which interferes with ADL,mobility and/or  hygiene and is unresponsive to medication management and other conservative care Informed consent was obtained after describing risks and benefits of the procedure with the patient. This includes bleeding, bruising, infection, excessive weakness, or medication side effects. A REMS form is on file and signed. Needle: 23g 1" needle  Number of units per muscle Pectoralis400 Biceps400 Brachioradialis 200 All injections were done after obtaining appropriate EMG activity and after negative drawback for blood. The patient tolerated the procedure well. Post procedure instructions were given. A followup appointment was made.  Lot [id number] Exp 05/2015

## 2015-02-24 NOTE — Progress Notes (Signed)
Day 1 of 3: Calorie Count Note  72 hour calorie count ordered.  Diet: Dysphagia 1 with nectar thick liquids Supplements: Glucerna Shake po BID, each supplement provides 220 kcal and 10 grams of protein  Breakfast: 573 kcal, 35 grams of protein Lunch: 630 kcal, 30 grams of protein Dinner: 667 kcal, 40 grams of protein Supplements: none  Day 1:Total intake: 1870 kcal (98% of minimum estimated needs)  105 grams of protein (100% of estimated needs)  Estimated Nutritional Needs: Kcal: 1900-2100 Protein: 95-105 grams Fluid: 1.9 - 2.1 L/day  Meal completion at each meal has been 100%. Pt reports appetite is good. RD to modify supplement orders for Glucerna shake once daily as po intake has been adequate.   Nutrition Dx:  Inadequate oral intake related to inability to eat as evidenced by NPO status; just advanced to diet; improving  Goal:  Pt to meet >/= 90% of their estimated nutrition needs; met  Intervention:   Continue Calorie Count.  Provide Glucerna Shake po once daily, each supplement provides 220 kcal and 10 grams of protein.  Continue to encourage po intake.   Corrin Parker, MS, RD, LDN Pager # [phone] After hours/ weekend pager # [phone]

## 2015-02-24 NOTE — Progress Notes (Signed)
Occupational Therapy Session Note  Patient Details  Name: Russell Austin MRN: [id number] Date of Birth: [birth year]  Today's Date: 02/24/2015 OT Individual Time: 0930-1030 OT Individual Time Calculation (min): 60 min    Short Term Goals: Week 1:  OT Short Term Goal 1 (Week 1): Pt will be able to don LB clothing with min A. OT Short Term Goal 1 - Progress (Week 1): Met OT Short Term Goal 2 (Week 1): Pt will complete a toilet transfer with S. OT Short Term Goal 2 - Progress (Week 1): Met (using a squat pivot only with S) OT Short Term Goal 3 (Week 1): Pt will be able to bathe with S. OT Short Term Goal 3 - Progress (Week 1): Met (using long handled sponge) OT Short Term Goal 4 (Week 1): Pt will be able to use his R arm to wash his L arm with min A. OT Short Term Goal 4 - Progress (Week 1): Met OT Short Term Goal 5 (Week 1): Pt will be able to stand with steadying A as he washes his bottom in shower. OT Short Term Goal 5 - Progress (Week 1): Met Week 2:  OT Short Term Goal 1 (Week 2): Pt will transfer to toilet using RW with steadying A. OT Short Term Goal 2 (Week 2): Pt will don LB clothing with S. OT Short Term Goal 3 (Week 2): Pt will use R arm to wash L arm without A. OT Short Term Goal 4 (Week 2): Pt will be able to stand in the shower with S using grab bars for support.  Skilled Therapeutic Interventions/Progress Updates:    Pt seen for BADL retraining of shower and dressing with a focus on R side awareness and balance. Pt used RW without hand splint to force him to focus on his grasp and pt told to increase his awareness of his R foot placement with each turn after he almost turned into shower with feet crossed.  Pt was then able to adjust the placement of his foot with every sit to stand and transfer the rest of the session without cuing, demonstrating his heightened awareness of R foot position. In shower, used long sponge to wash L arm although he was able to hold wash cloth in  R hand to wash most of his L arm. Steadying A with balance in shower and LB dressing as pt continues to put wt through his heels and lean back.  Donned socks and shoes without A.  Pt taken to gym last 15 min of session to address RUE tightness in shoulder. Pt transferred to mat to lay in supine. PROM to scapula, shoulder, elbow with deep tissue massage for upper traps and pecs. Pt sat to EOM and completed a few gentle pendulum exercises. Transferred back to w/c and took himself back to his room.  Therapy Documentation Precautions:  Precautions Precautions: Fall  Restrictions Weight Bearing Restrictions: No    Pain: Pain Assessment Pain Assessment: No/denies pain ADL: ADL ADL Comments: refer to FIM  See FIM for current functional status  Therapy/Group: Individual Therapy  Jamol Ginyard 02/24/2015, 11:49 AM

## 2015-02-25 ENCOUNTER — Inpatient Hospital Stay (HOSPITAL_COMMUNITY): Payer: BC Managed Care – PPO

## 2015-02-25 ENCOUNTER — Inpatient Hospital Stay (HOSPITAL_COMMUNITY): Payer: BC Managed Care – PPO | Admitting: Occupational Therapy

## 2015-02-25 ENCOUNTER — Inpatient Hospital Stay (HOSPITAL_COMMUNITY): Payer: BC Managed Care – PPO | Admitting: Speech Pathology

## 2015-02-25 LAB — BASIC METABOLIC PANEL
Anion gap: 8 (ref 5–15)
BUN: 16 mg/dL (ref 6–20)
CALCIUM: 8.3 mg/dL — AB (ref 8.9–10.3)
CO2: 24 mmol/L (ref 22–32)
Chloride: 107 mmol/L (ref 101–111)
Creatinine, Ser: 1.08 mg/dL (ref 0.61–1.24)
Glucose, Bld: 180 mg/dL — ABNORMAL HIGH (ref 65–99)
Potassium: 4.4 mmol/L (ref 3.5–5.1)
SODIUM: 139 mmol/L (ref 135–145)

## 2015-02-25 LAB — GLUCOSE, CAPILLARY
Glucose-Capillary: 162 mg/dL — ABNORMAL HIGH (ref 65–99)
Glucose-Capillary: 237 mg/dL — ABNORMAL HIGH (ref 65–99)
Glucose-Capillary: 207 mg/dL — ABNORMAL HIGH (ref 65–99)
Glucose-Capillary: 175 mg/dL — ABNORMAL HIGH (ref 65–99)

## 2015-02-25 NOTE — Progress Notes (Signed)
Physical Therapy Session Note  Patient Details  Name: Russell Austin MRN: [id number] Date of Birth: 12-06-1950  Today's Date: 02/25/2015 PT Individual Time: [id number] PT Individual Time Calculation (min): 29 min   Short Term Goals: Week 2:  PT Short Term Goal 1 (Week 2): = LTG of MOD I overall for D/C 03/01/15  Skilled Therapeutic Interventions/Progress Updates:    Patient ambulated with [facility] min/mod assist around cones, episodes of loss of balance on turns with right foot catching.  Ambulated without device mod facilitation at axillae for weight shift, arm swing, lateral trunk flexion and protraction x 160' x 2.  Patient educated in safest device remains RW, but needs challenge outside this for best recovery.  Seated hamstring stretch on right with cues 2 x 30 seconds, then passive heel cord stretch 2 x 30 seconds on right.  Patient independent with wheelchair to room.  Therapy Documentation Precautions:  Precautions Precautions: Fall Precaution Comments: Dys 1 diet, nectar thick Restrictions Weight Bearing Restrictions: No Pain: Pain Assessment Pain Assessment: No/denies pain Locomotion : Ambulation Ambulation/Gait Assistance: 4: Min assist;3: Mod assist Wheelchair Mobility Distance: 150   See FIM for current functional status  Therapy/Group: Individual Therapy  [facility], [place] 02/25/2015  02/25/2015, 5:44 PM

## 2015-02-25 NOTE — Progress Notes (Addendum)
Day 2: Calorie Count Note  72 hour calorie count ordered.  Diet: Dysphagia 1 with nectar thick liquids Supplements: Glucerna Shake po once daily, each supplement provides 220 kcal and 10 grams of protein  Breakfast: 620 kcal, 28 grams of protein Lunch: 750 kcal, 35 grams of protein Dinner: 700 kcal, 36 grams of protein Supplements: none  Day 2:Total intake: 2070 kcal (100% of minimum estimated needs)  99 grams of protein (100% of estimated needs)  Estimated Nutritional Needs: Kcal: 1900-2100 Protein: 95-105 grams Fluid: 1.9 - 2.1 L/day  Meal completion at each meal has been 100%. Meal completion has been adequate over the past 2 days. RD to discontinue calorie count.   Nutrition Dx:  Inadequate oral intake related to inability to eat as evidenced by NPO status; just advanced to diet; resolved.  New Nutrition Dx: Increased nutrient needs related to therapy as evidenced by estimated nutrition needs.   Goal:  Pt to meet >/= 90% of their estimated nutrition needs; met  Intervention:   Discontinue Calorie Count.  Provide Glucerna Shake po once daily, each supplement provides 220 kcal and 10 grams of protein.  Continue to encourage po intake.   Russell Parker, MS, RD, LDN Pager # [phone] After hours/ weekend pager # [phone]

## 2015-02-25 NOTE — Progress Notes (Signed)
Occupational Therapy Session Note  Patient Details  Name: Russell Austin MRN: [id number] Date of Birth: 01-03-1951  Today's Date: 02/25/2015 OT Individual Time: 1000-1100 and 1415-1500  OT Individual Time Calculation (min): 60 min and 45 min   Short Term Goals: Week 1:  OT Short Term Goal 1 (Week 1): Pt will be able to don LB clothing with min A. OT Short Term Goal 1 - Progress (Week 1): Met OT Short Term Goal 2 (Week 1): Pt will complete a toilet transfer with S. OT Short Term Goal 2 - Progress (Week 1): Met (using a squat pivot only with S) OT Short Term Goal 3 (Week 1): Pt will be able to bathe with S. OT Short Term Goal 3 - Progress (Week 1): Met (using long handled sponge) OT Short Term Goal 4 (Week 1): Pt will be able to use his R arm to wash his L arm with min A. OT Short Term Goal 4 - Progress (Week 1): Met OT Short Term Goal 5 (Week 1): Pt will be able to stand with steadying A as he washes his bottom in shower. OT Short Term Goal 5 - Progress (Week 1): Met Week 2:  OT Short Term Goal 1 (Week 2): Pt will transfer to toilet using RW with steadying A. OT Short Term Goal 2 (Week 2): Pt will don LB clothing with S. OT Short Term Goal 3 (Week 2): Pt will use R arm to wash L arm without A. OT Short Term Goal 4 (Week 2): Pt will be able to stand in the shower with S using grab bars for support.  Skilled Therapeutic Interventions/Progress Updates:    Visit 1:  Pain:no c/o pain Tx: Pt seen this session for BADL retraining utilizing quad cane vs. Walker.   Pt was able to stand and ambulate to shower with steadying A and improved motor control and awareness of RLE. Pt showered with steadying A. Dressed with steadying A. Good use of R hand to pull pants up over thighs. R arm abducted to 90 to apply deoderent. Pt was then taken to gym to transfer to mat to work on RUE AROM exercises with a/arom support with ball rolls, B ball holds with reaching arms forward, holding dowel bar with both  hands and working on pushing/ pulling exercises. Pt has much reduced tone today with improved motor control and smoother movement patterns of R arm.  Pt transferred back to w/c and transported self back to room.   Visit 2: Pain: no c/o pain Tx: Pt transported himself to gym to prepare for therapy session.  Pt transferred to mat to move into supine to work on PNF D1, 2 patterns to facilitate shoulder control. Min A to control movement, but pt has improved AROM and is able to apply good resistance through end of ROM.  Overhead tricep extension with min A to stabilize upper arm. Resisted extension of tricep. Pt moved into prone on elbows and then into quadriped with max A. Once in quad, he stabilized self and worked on R wt bearing through extended arm, wt shifting and alternating hand placement of both R and Left hands forward and back with good scapular stability.  Pt then rested in sidelying briefly, in sidelying worked on resisted scapular depression and active sh flex with hand rolling ball forward on mat.  Moved into sitting, for focus on distal motor control of pronation/supination and then isolated thumb prehension to each digit with mod A. Grasping and releasing exercises  with cones. Pt has improved a great deal with R shoulder and elbow control, developing grasp. Limited isolated finger movement and decreased thumb strength. Pt resting on mat for next PT session.  Therapy Documentation Precautions:  Precautions Precautions: Fall Precaution Comments: Dys 1 diet, nectar thick Restrictions Weight Bearing Restrictions: No    ADL: ADL ADL Comments: refer to FIM  See FIM for current functional status  Therapy/Group: Individual Therapy  Russell Austin 02/25/2015, 1:00 PM

## 2015-02-25 NOTE — Progress Notes (Signed)
Social Work Patient ID: Russell Austin, male   DOB: 03-29-1951, 64 y.o.   MRN: [id number]   CSW met with pt to update him on team conference discussion.  Although pt was disappointed, he understands why therapy team wants to keep working with him longer.  CSW offered support and encouragement.  Pt agreed to stay until after therapies on 03-03-15.  CSW will continue to follow and assist with d/c arrangements next week.

## 2015-02-25 NOTE — Progress Notes (Signed)
Physical Therapy Session Note  Patient Details  Name: Russell Austin MRN: [id number] Date of Birth: [birth year]  Today's Date: 02/25/2015 PT Individual Time: 4758-3074 PT Individual Time Calculation (min): 30 min   Short Term Goals: Week 1:  PT Short Term Goal 1 (Week 1): Patient will propel wheelchair independent level controlled surfaces including set up for transfers. PT Short Term Goal 1 - Progress (Week 1): Met PT Short Term Goal 2 (Week 1): Patient will ambulate 100' level tile and carpet surfaces with LRAD min assist PT Short Term Goal 2 - Progress (Week 1): Met PT Short Term Goal 3 (Week 1): Patient will negotiate 12 stairs with min assist with railing  PT Short Term Goal 3 - Progress (Week 1): Met PT Short Term Goal 4 (Week 1): Patient will demonstrate decreased fall risk with Berg score at least 22/56. PT Short Term Goal 4 - Progress (Week 1): Met Week 2:  PT Short Term Goal 1 (Week 2): = LTG of MOD I overall for D/C 03/01/15  Skilled Therapeutic Interventions/Progress Updates:   Pt propelled w/c on unit mod I for general strengthening and endurance. Session focused on gait training (started with RW to promote functional grip and use of RUE but progressed to [facility] to challenge balance) with focus on cues for gait pattern with [facility] and min A for balance (a few episode of R foot sticking requiring min A to recover and cues to adjust foot placement) > 150' multiple trials and progressing gait pattern from step by step to [facility] and R foot at same time (still step to) with cues. Neuro re-ed on Nustep for reciprocal movement pattern in BLE and use of RUE only (LUE on lap) with focus on maintaining grip strength on level 4. Returned to room (part walk and part w/c mobility) with all needs in place. Pt with excellent participation in session and continues to be very motivated.   Therapy Documentation Precautions:  Precautions Precautions: Fall Precaution Comments: Dys 1 diet, nectar  thick Restrictions Weight Bearing Restrictions: No Pain: Pain Assessment Pain Assessment: No/denies pain Locomotion : Ambulation Ambulation/Gait Assistance: 4: Min assist   See FIM for current functional status  Therapy/Group: Individual Therapy  Canary Brim Ivory Broad, PT, DPT  02/25/2015, 9:52 AM

## 2015-02-25 NOTE — Patient Care Conference (Signed)
Inpatient RehabilitationTeam Conference and Plan of Care Update Date: 02/23/2015   Time: 10:35 AM    Patient Name: Russell Austin      Medical Record Number: [id number]  Date of Birth: 1951-06-20 Sex: Male         Room/Bed: 4W05C/4W05C-01 Payor Info: Payor: BLUE Administrator, sports / Plan: [place] PPO / Product Type: *No Product type* /    Admitting Diagnosis: L CVA  Admit Date/Time:  02/14/2015  6:21 PM Admission Comments: No comment available   Primary Diagnosis:  Cerebral infarction due to thrombosis of basilar artery Principal Problem: Cerebral infarction due to thrombosis of basilar artery  Patient Active Problem List   Diagnosis Date Noted  . Dysphagia following cerebral infarction 02/15/2015  . Cerebral infarction due to thrombosis of basilar artery   . CKD stage 2 due to type 2 diabetes mellitus 02/12/2015  . Right hemiparesis   . CVA (cerebral infarction) 02/10/2015  . CKD (chronic kidney disease) 02/10/2015  . Type 2 diabetes mellitus with diabetic neuropathy 02/10/2015  . Neck pain   . Numbness and tingling 02/09/2015  . Back pain, acute 10/30/2012  . Hematuria of undiagnosed cause 10/30/2012  . Proteinuria 10/30/2012  . Left shoulder pain 07/17/2012  . Plantar fasciitis, bilateral 07/17/2012  . HEADACHE 01/07/2009  . Peripheral neuropathy 11/06/2007  . ERECTILE DYSFUNCTION, MILD 11/06/2007  . 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 Essential hypertension 07/08/2007  . EXTRINSIC ASTHMA, UNSPECIFIED 06/25/2007    Expected Discharge Date: Expected Discharge Date: 03/03/15  Team Members Present: Physician leading conference: Dr. Alysia Penna Social Worker Present: Alfonse Alpers, LCSW Nurse Present: Dorthula Nettles, RN PT Present: Raylene Everts, PT;Caroline Lacinda Axon, PT;Other (comment) Randa Ngo, PT) OT Present: Clyda Greener, OT;Julia Saguier, Jules Schick, OT SLP Present: Windell Moulding, SLP PPS Coordinator  present : Daiva Nakayama, RN, CRRN     Current Status/Progress Goal Weekly Team Focus  Medical   RUE tone is increasing, reduced coordination, no taking po  sup/mod I level  swallow retraining   Bowel/Bladder   Pt. continent of bowel and bladder  Supervision  Remain continent of bowel and bladder   Swallow/Nutrition/ Hydration   dys 1, nectar thick liquids   mod I   toleration of currently prescribed diet    ADL's   improved from mod A to min A overall  mod I  overall  ADL retraining, RUE NMR, estim, dynamic balance, pt education   Mobility   Min A overall transfers, gait, stairs; mod I w/c mobility, BERG increased to 23/56  mod I overall  Motor control and balance during gait, stairs, R NMR   Communication             Safety/Cognition/ Behavioral Observations  WFL         Pain   No c/o pain     Assess pain q shift   Skin   No skin issues at this time  Remain free from skin breakdown while on rehab  assess skin q shift    Rehab Goals Patient on target to meet rehab goals: Yes Rehab Goals Revised: none *See Care Plan and progress notes for long and short-term goals.  Barriers to Discharge: increasing    Possible Resolutions to Barriers:  cont rehab    Discharge Planning/Teaching Needs:  Pt plans to return to his home where he hopes to be modified independent while his wife is at work.  Pt has  a brother who can be with him, if needed.  Pt's goals are all modified independent.  Family can come in, if needed, for family education.   Team Discussion:  Pt is making progress, but team needs more time with him to get him closer to modified independent.  OT reports that pt's arm is cramping up when he lays down.  PT said that pt's balance is affected and they are working on this and coordination.  ST is conservative with their comfort feeding and said that pt has a significant amt of residue and needs to swallow 3-4 times with eat bite and needs full supervision with meals.   Revisions to Treatment Plan:  Pt's stay was extended until 03-03-15 after therapies.   Continued Need for Acute Rehabilitation Level of Care: The patient requires daily medical management by a physician with specialized training in physical medicine and rehabilitation for the following conditions: Daily direction of a multidisciplinary physical rehabilitation program to ensure safe treatment while eliciting the highest outcome that is of practical value to the patient.: Yes Daily medical management of patient stability for increased activity during participation in an intensive rehabilitation regime.: Yes Daily analysis of laboratory values and/or radiology reports with any subsequent need for medication adjustment of medical intervention for : Neurological problems;Other  Egon Dittus, [name] 02/25/2015, 2:33 PM

## 2015-02-25 NOTE — Progress Notes (Signed)
Subjective/Complaints: No pain in the right upper extremity after Dysport injection. ROS- No CP or SOB, no abdominal pain nausea or vomiting, weakness on the right side Objective: Vital Signs: Blood pressure 139/62, pulse 63, temperature 98.5 F (36.9 C), temperature source Oral, resp. rate 18, weight 78 kg (171 lb 15.3 oz), SpO2 100 %. No results found. Results for orders placed or performed during the hospital encounter of 02/14/15 (from the past 72 hour(s))  Basic metabolic panel     Status: Abnormal   Collection Time: 02/22/15 10:25 AM  Result Value Ref Range   Sodium 135 135 - 145 mmol/L   Potassium 6.6 (HH) 3.5 - 5.1 mmol/L    Comment: NO VISIBLE HEMOLYSIS REPEATED TO VERIFY CRITICAL RESULT CALLED TO, READ BACK BY AND VERIFIED WITH: S.JENNINGS,RN 1118 02/22/15 CLARK,S    Chloride 102 101 - 111 mmol/L   CO2 26 22 - 32 mmol/L   Glucose, Bld 195 (H) 65 - 99 mg/dL   BUN 31 (H) 6 - 20 mg/dL   Creatinine, Ser 1.50 (H) 0.61 - 1.24 mg/dL   Calcium 8.7 (L) 8.9 - 10.3 mg/dL   GFR calc non Af Amer 48 (L) >60 mL/min   GFR calc Af Amer 55 (L) >60 mL/min    Comment: (NOTE) The eGFR has been calculated using the CKD EPI equation. This calculation has not been validated in all clinical situations. eGFR's persistently <60 mL/min signify possible Chronic Kidney Disease.    Anion gap 7 5 - 15  Glucose, capillary     Status: Abnormal   Collection Time: 02/22/15 12:02 PM  Result Value Ref Range   Glucose-Capillary 188 (H) 65 - 99 mg/dL  Basic metabolic panel     Status: Abnormal   Collection Time: 02/22/15  4:20 PM  Result Value Ref Range   Sodium 139 135 - 145 mmol/L   Potassium 5.1 3.5 - 5.1 mmol/L    Comment: NO VISIBLE HEMOLYSIS   Chloride 103 101 - 111 mmol/L   CO2 25 22 - 32 mmol/L   Glucose, Bld 175 (H) 65 - 99 mg/dL   BUN 33 (H) 6 - 20 mg/dL   Creatinine, Ser 1.40 (H) 0.61 - 1.24 mg/dL   Calcium 8.7 (L) 8.9 - 10.3 mg/dL   GFR calc non Af Amer 52 (L) >60 mL/min   GFR  calc Af Amer >60 >60 mL/min    Comment: (NOTE) The eGFR has been calculated using the CKD EPI equation. This calculation has not been validated in all clinical situations. eGFR's persistently <60 mL/min signify possible Chronic Kidney Disease.    Anion gap 11 5 - 15  Glucose, capillary     Status: Abnormal   Collection Time: 02/22/15  4:41 PM  Result Value Ref Range   Glucose-Capillary 187 (H) 65 - 99 mg/dL  Glucose, capillary     Status: Abnormal   Collection Time: 02/22/15 11:49 PM  Result Value Ref Range   Glucose-Capillary 204 (H) 65 - 99 mg/dL  Glucose, capillary     Status: None   Collection Time: 02/23/15  6:15 AM  Result Value Ref Range   Glucose-Capillary 97 65 - 99 mg/dL  Basic metabolic panel     Status: Abnormal   Collection Time: 02/23/15  7:40 AM  Result Value Ref Range   Sodium 141 135 - 145 mmol/L   Potassium 4.3 3.5 - 5.1 mmol/L   Chloride 108 101 - 111 mmol/L   CO2 26 22 - 32 mmol/L  Glucose, Bld 100 (H) 65 - 99 mg/dL   BUN 27 (H) 6 - 20 mg/dL   Creatinine, Ser 1.26 (H) 0.61 - 1.24 mg/dL   Calcium 8.3 (L) 8.9 - 10.3 mg/dL   GFR calc non Af Amer 59 (L) >60 mL/min   GFR calc Af Amer >60 >60 mL/min    Comment: (NOTE) The eGFR has been calculated using the CKD EPI equation. This calculation has not been validated in all clinical situations. eGFR's persistently <60 mL/min signify possible Chronic Kidney Disease.    Anion gap 7 5 - 15  Glucose, capillary     Status: Abnormal   Collection Time: 02/23/15 11:50 AM  Result Value Ref Range   Glucose-Capillary 256 (H) 65 - 99 mg/dL  Glucose, capillary     Status: Abnormal   Collection Time: 02/23/15  4:30 PM  Result Value Ref Range   Glucose-Capillary 123 (H) 65 - 99 mg/dL  Glucose, capillary     Status: Abnormal   Collection Time: 02/23/15 10:23 PM  Result Value Ref Range   Glucose-Capillary 323 (H) 65 - 99 mg/dL   Comment 1 Notify RN   Glucose, capillary     Status: None   Collection Time: 02/24/15  7:03  AM  Result Value Ref Range   Glucose-Capillary 75 65 - 99 mg/dL   Comment 1 Notify RN   Glucose, capillary     Status: Abnormal   Collection Time: 02/24/15 12:00 PM  Result Value Ref Range   Glucose-Capillary 187 (H) 65 - 99 mg/dL   Comment 1 Notify RN   Glucose, capillary     Status: Abnormal   Collection Time: 02/24/15  4:47 PM  Result Value Ref Range   Glucose-Capillary 210 (H) 65 - 99 mg/dL  Glucose, capillary     Status: Abnormal   Collection Time: 02/24/15  9:33 PM  Result Value Ref Range   Glucose-Capillary 157 (H) 65 - 99 mg/dL   Comment 1 Notify RN   Basic metabolic panel     Status: Abnormal   Collection Time: 02/25/15  5:18 AM  Result Value Ref Range   Sodium 139 135 - 145 mmol/L   Potassium 4.4 3.5 - 5.1 mmol/L   Chloride 107 101 - 111 mmol/L   CO2 24 22 - 32 mmol/L   Glucose, Bld 180 (H) 65 - 99 mg/dL   BUN 16 6 - 20 mg/dL   Creatinine, Ser 1.08 0.61 - 1.24 mg/dL   Calcium 8.3 (L) 8.9 - 10.3 mg/dL   GFR calc non Af Amer >60 >60 mL/min   GFR calc Af Amer >60 >60 mL/min    Comment: (NOTE) The eGFR has been calculated using the CKD EPI equation. This calculation has not been validated in all clinical situations. eGFR's persistently <60 mL/min signify possible Chronic Kidney Disease.    Anion gap 8 5 - 15  Glucose, capillary     Status: Abnormal   Collection Time: 02/25/15  6:59 AM  Result Value Ref Range   Glucose-Capillary 162 (H) 65 - 99 mg/dL   Comment 1 Notify RN      HEENT: normal Cardio: RRR and no murmur Resp: CTA B/L and unlabored GI: BS positive and NT, ND Extremity:  Pulses positive and No Edema Skin:   Intact Neuro: Alert/Oriented Right upper extremity 3 minus at the deltoid, biceps, triceps, grip 3 minus at the hip flexor on the right 4 minus knee extensor MAS 3 at left biceps, pectoralis, 1-2 in finger  and wrist flexors  Assessment/Plan: 1. 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 Physiatrist is providing close team supervision and 24 hour management of active medical problems listed below. Physiatrist and rehab team continue to assess barriers to discharge/monitor patient progress toward functional and medical goals. Status post botulinum toxin injection 02/24/2015 to the right pectoralis right biceps and right brachial radialis FIM: FIM - Bathing Bathing Steps Patient Completed: Chest, Abdomen, Front perineal area, Right upper leg, Left upper leg, Right lower leg (including foot), Left lower leg (including foot), Buttocks, Right Arm, Left Arm (used long sponge) Bathing: 4: Steadying assist  FIM - Upper Body Dressing/Undressing Upper body dressing/undressing steps patient completed: Thread/unthread left sleeve of pullover shirt/dress, Pull shirt over trunk, Put head through opening of pull over shirt/dress, Thread/unthread right sleeve of pullover shirt/dresss Upper body dressing/undressing: 6: More than reasonable amount of time FIM - Lower Body Dressing/Undressing Lower body dressing/undressing steps patient completed: Thread/unthread right underwear leg, Thread/unthread left underwear leg, Pull underwear up/down, Thread/unthread right pants leg, Thread/unthread left pants leg, Pull pants up/down, Don/Doff left sock, Don/Doff left shoe, Fasten/unfasten right shoe, Fasten/unfasten left shoe, Don/Doff right sock, Don/Doff right shoe Lower body dressing/undressing: 4: Steadying Assist  FIM - Toileting Toileting steps completed by patient: Adjust clothing prior to toileting, Performs perineal hygiene, Adjust clothing after toileting Toileting Assistive Devices: Grab bar or rail for support Toileting: 4: Steadying assist  FIM - Radio producer Devices: Grab bars Toilet Transfers: 4-To toilet/BSC: Min A (steadying Pt. > 75%), 4-From toilet/BSC: Min  A (steadying Pt. > 75%)  FIM - Bed/Chair Transfer Bed/Chair Transfer Assistive Devices: Arm rests Bed/Chair Transfer: 4: Bed > Chair or W/C: Min A (steadying Pt. > 75%), 4: Chair or W/C > Bed: Min A (steadying Pt. > 75%)  FIM - Locomotion: Wheelchair Distance: 150 Locomotion: Wheelchair: 6: Travels 150 ft or more, turns around, maneuvers to table, bed or toilet, negotiates 3% grade: maneuvers on rugs and over door sills independently FIM - Locomotion: Ambulation Locomotion: Ambulation Assistive Devices: Administrator Ambulation/Gait Assistance: 4: Min assist Locomotion: Ambulation: 2: Travels 50 - 149 ft with minimal assistance (Pt.>75%)  Comprehension Comprehension Mode: Auditory Comprehension: 5-Understands basic 90% of the time/requires cueing < 10% of the time  Expression Expression Mode: Verbal Expression: 5-Expresses complex 90% of the time/cues < 10% of the time  Social Interaction Social Interaction: 6-Interacts appropriately with others with medication or extra time (anti-anxiety, antidepressant).  Problem Solving Problem Solving: 5-Solves complex 90% of the time/cues < 10% of the time  Memory Memory: 6-More than reasonable amt of time  Medical Problem List and Plan: 1. 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  DVT Prophylaxis/Anticoagulation: Pharmaceutical: Lovenox 3. Pain Management: N/A 4. Mood: LCSW to follow for evaluation and support. Has good family support.   5. Neuropsych: This patient is capable of making decisions on his own behalf. 6. Skin/Wound Care: Routine pressure relief measures.   7. Fluids/Electrolytes/Nutrition:now on oral feeding, monitor calories, monitor fluid intake, monitor basic metabolic, IV fluids at night 8. HTN: Monitor BP every 8 hours. off Prinivil because of acute renal failure which was also influenced by inadequate fluid intake  9. Severe dysphagia: NPO. Will have  dietician assist with nutritional needs as complaining of hunger--had colonoscopy last Wed.       10. DM type  2: Hgb A1C-7.6.fair control Was on metformin 1000 mg bid with Glucotrol XL 10 mg daily. Now on Lantus due to continuous tube feeds.   11. CKD: Baseline Cr- 1.3.  Creat at baseline, given elevated BUN/creatinine will hold off on HCTZ, if BP up consider Norvasc 12. Dyslipidemia: continue Lipitor.     LOS (Days) 11 A FACE TO FACE EVALUATION WAS PERFORMED  Halaina Vanduzer E 02/25/2015, 8:55 AM

## 2015-02-25 NOTE — Progress Notes (Signed)
Speech Language Pathology Daily Session Note  Patient Details  Name: Russell Austin MRN: [id number] Date of Birth: December 02, 1950  Today's Date: 02/25/2015 SLP Individual Time: 0800-0830 SLP Individual Time Calculation (min): 30 min  Short Term Goals: Week 2: SLP Short Term Goal 1 (Week 2): Pt will demonstrate minimal overt s/s aspiration with trials of ice chips with mod I use of swallowing precautions.  SLP Short Term Goal 1 - Progress (Week 2): Updated due to goal met SLP Short Term Goal 2 (Week 2): Pt will perform pharyngeal strengthening exercises with mod I SLP Short Term Goal 3 (Week 2): Pt will consume dys 1 textures and nectar thick liquids with mod I use of swallowing precautions.    Skilled Therapeutic Interventions:  Pt was seen for skilled ST targeting dysphagia goals.  Upon arrival, pt was seated upright in wheelchair and had just finished consuming breakfast.  Pt reported still feeling hungry; therefore SLP provided him with additional presentations of his currently prescribed diet.  Pt consumed dys 1 textures and nectar thick liquids with x1 verbal cue for recall of nectar thick liquids via teaspoon.  No overt s/s of aspiration were noted with dys 1 textures or nectar thick liquids.  Pt would benefit from trials of advanced consistencies during next available appointment.  Pt left upright in wheelchair with call bell left within reach.  Continue per current plan of care.     FIM:  Comprehension Comprehension Mode: Auditory Comprehension: 5-Understands basic 90% of the time/requires cueing < 10% of the time Expression Expression Mode: Verbal Expression: 5-Expresses complex 90% of the time/cues < 10% of the time Social Interaction Social Interaction: 6-Interacts appropriately with others with medication or extra time (anti-anxiety, antidepressant). Problem Solving Problem Solving: 5-Solves complex 90% of the time/cues < 10% of the time Memory Memory: 6-More than  reasonable amt of time FIM - Eating Eating Activity: 5: Supervision/cues  Pain Pain Assessment Pain Assessment: No/denies pain  Therapy/Group: Individual Therapy  Janan Bogie, Selinda Orion 02/25/2015, 3:34 PM

## 2015-02-26 ENCOUNTER — Inpatient Hospital Stay (HOSPITAL_COMMUNITY): Payer: BC Managed Care – PPO | Admitting: Occupational Therapy

## 2015-02-26 ENCOUNTER — Inpatient Hospital Stay (HOSPITAL_COMMUNITY): Payer: BC Managed Care – PPO

## 2015-02-26 LAB — GLUCOSE, CAPILLARY
GLUCOSE-CAPILLARY: 189 mg/dL — AB (ref 65–99)
Glucose-Capillary: 165 mg/dL — ABNORMAL HIGH (ref 65–99)
Glucose-Capillary: 200 mg/dL — ABNORMAL HIGH (ref 65–99)
Glucose-Capillary: 311 mg/dL — ABNORMAL HIGH (ref 65–99)

## 2015-02-26 MED ORDER — INSULIN GLARGINE 100 UNIT/ML ~~LOC~~ SOLN
23.0000 [IU] | Freq: Every day | SUBCUTANEOUS | Status: DC
  Administered 2015-02-26 – 2015-03-02 (×5): 23 [IU] via SUBCUTANEOUS
  Filled 2015-02-26 (×6): qty 0.23

## 2015-02-26 NOTE — Progress Notes (Addendum)
Physical Therapy Session Note  Patient Details  Name: Russell Austin MRN: [id number] Date of Birth: 1951-05-26  Today's Date: 02/26/2015 PT Individual Time: 0830-0930 PT Individual Time Calculation (min): 60 min   Short Term Goals: Week 2:  PT Short Term Goal 1 (Week 2): = LTG of MOD I overall for D/C 03/01/15  Skilled Therapeutic Interventions/Progress Updates:  tx focused on therapeutic activities, neuromuscular re-education via VCs, manual cues, forced use for RLE flexibility, trunk activation for trunk righting, RLE stance stability in standing/gait  Pt brought himself to PT via hemi method w/c independently.   Squat pivot transfer to R, close supervision w/c> mat  Neuro re-ed: In unsupported sitting- reaching laterally L and R for trunk shortening/lengthening/rotating; in supported sitting with feet on wedge for stretch of bil soleus muscles, x 30 seconds x 3; in standing on wedge for stretch of bil gastroc muscles x 2.5 minutes. Gait with WBQC x 50' weaving through cones focusing on stability during turns to R, without LOB over 5 turns, but pt looking at feet throughout, min assist.  Gait stepping over canes and hockey sticks x 12 for increased hip and knee flexion.  Gait up/down 10 steps 1 rail with step to asecnding and descending, except last 2 steps descending with step through method: min assist/supervision, mod cues for sequencing.  W/c propulsion using bil LEs without UEs for L swing phase retraining.    Activity in supported sitting: tapping beach ball with bil hands on 1# bar, x 15 x 3.  Floor transfer to floor mat for fall recovery, close supervision in 1 trial after demo.  Fall risk and use of 911 discussed.  Pt returned to room independently in w/c. Therapy Documentation Precautions:  Precautions Precautions: Fall Precaution Comments: Dys 1 diet, nectar thick Restrictions Weight Bearing Restrictions: No  Pain: Pain Assessment Pain Assessment: No/denies  pain locomotion : Ambulation Ambulation/Gait Assistance: 4: Min assist Wheelchair Mobility Distance: 150      See FIM for current functional status  Therapy/Group: Individual Therapy  Ardyn Forge 02/26/2015, 9:32 AM

## 2015-02-26 NOTE — Progress Notes (Signed)
Subjective/Complaints: CC:  Don't like diet Tolerating D1 Nectar Off IVF at noc, recheck BMP on monday ROS- No CP or SOB, no abdominal pain nausea or vomiting, weakness on the right side Objective: Vital Signs: Blood pressure 149/64, pulse 70, temperature 97.6 F (36.4 C), temperature source Oral, resp. rate 16, weight 78 kg (171 lb 15.3 oz), SpO2 100 %. No results found. Results for orders placed or performed during the hospital encounter of 02/14/15 (from the past 72 hour(s))  Glucose, capillary     Status: Abnormal   Collection Time: 02/23/15 11:50 AM  Result Value Ref Range   Glucose-Capillary 256 (H) 65 - 99 mg/dL  Glucose, capillary     Status: Abnormal   Collection Time: 02/23/15  4:30 PM  Result Value Ref Range   Glucose-Capillary 123 (H) 65 - 99 mg/dL  Glucose, capillary     Status: Abnormal   Collection Time: 02/23/15 10:23 PM  Result Value Ref Range   Glucose-Capillary 323 (H) 65 - 99 mg/dL   Comment 1 Notify RN   Glucose, capillary     Status: None   Collection Time: 02/24/15  7:03 AM  Result Value Ref Range   Glucose-Capillary 75 65 - 99 mg/dL   Comment 1 Notify RN   Glucose, capillary     Status: Abnormal   Collection Time: 02/24/15 12:00 PM  Result Value Ref Range   Glucose-Capillary 187 (H) 65 - 99 mg/dL   Comment 1 Notify RN   Glucose, capillary     Status: Abnormal   Collection Time: 02/24/15  4:47 PM  Result Value Ref Range   Glucose-Capillary 210 (H) 65 - 99 mg/dL  Glucose, capillary     Status: Abnormal   Collection Time: 02/24/15  9:33 PM  Result Value Ref Range   Glucose-Capillary 157 (H) 65 - 99 mg/dL   Comment 1 Notify RN   Basic metabolic panel     Status: Abnormal   Collection Time: 02/25/15  5:18 AM  Result Value Ref Range   Sodium 139 135 - 145 mmol/L   Potassium 4.4 3.5 - 5.1 mmol/L   Chloride 107 101 - 111 mmol/L   CO2 24 22 - 32 mmol/L   Glucose, Bld 180 (H) 65 - 99 mg/dL   BUN 16 6 - 20 mg/dL   Creatinine, Ser 1.08 0.61 - 1.24  mg/dL   Calcium 8.3 (L) 8.9 - 10.3 mg/dL   GFR calc non Af Amer >60 >60 mL/min   GFR calc Af Amer >60 >60 mL/min    Comment: (NOTE) The eGFR has been calculated using the CKD EPI equation. This calculation has not been validated in all clinical situations. eGFR's persistently <60 mL/min signify possible Chronic Kidney Disease.    Anion gap 8 5 - 15  Glucose, capillary     Status: Abnormal   Collection Time: 02/25/15  6:59 AM  Result Value Ref Range   Glucose-Capillary 162 (H) 65 - 99 mg/dL   Comment 1 Notify RN   Glucose, capillary     Status: Abnormal   Collection Time: 02/25/15 11:25 AM  Result Value Ref Range   Glucose-Capillary 237 (H) 65 - 99 mg/dL  Glucose, capillary     Status: Abnormal   Collection Time: 02/25/15  4:40 PM  Result Value Ref Range   Glucose-Capillary 207 (H) 65 - 99 mg/dL  Glucose, capillary     Status: Abnormal   Collection Time: 02/25/15  8:24 PM  Result Value Ref Range  Glucose-Capillary 175 (H) 65 - 99 mg/dL  Glucose, capillary     Status: Abnormal   Collection Time: 02/26/15  6:57 AM  Result Value Ref Range   Glucose-Capillary 165 (H) 65 - 99 mg/dL     HEENT: normal Cardio: RRR and no murmur Resp: CTA B/L and unlabored GI: BS positive and NT, ND Extremity:  Pulses positive and mild R hand Edema Skin:   Intact Neuro: Alert/Oriented Right upper extremity 3 minus at the deltoid, biceps, triceps, grip 3 minus at the hip flexor on the right 4 minus knee extensor MAS 3 at left biceps, pectoralis, 1-2 in finger and wrist flexors  Assessment/Plan: 1. 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 Physiatrist is providing close team supervision and 24 hour management of active medical problems listed below. Physiatrist and rehab team continue to assess barriers to discharge/monitor patient progress toward  functional and medical goals. Status post botulinum toxin injection 02/24/2015 to the right pectoralis right biceps and right brachial radialis FIM: FIM - Bathing Bathing Steps Patient Completed: Chest, Abdomen, Front perineal area, Right upper leg, Left upper leg, Right lower leg (including foot), Left lower leg (including foot), Buttocks, Right Arm, Left Arm Bathing: 5: Supervision: Safety issues/verbal cues  FIM - Upper Body Dressing/Undressing Upper body dressing/undressing steps patient completed: Thread/unthread left sleeve of pullover shirt/dress, Pull shirt over trunk, Put head through opening of pull over shirt/dress, Thread/unthread right sleeve of pullover shirt/dresss Upper body dressing/undressing: 6: More than reasonable amount of time FIM - Lower Body Dressing/Undressing Lower body dressing/undressing steps patient completed: Thread/unthread right underwear leg, Thread/unthread left underwear leg, Pull underwear up/down, Thread/unthread right pants leg, Thread/unthread left pants leg, Pull pants up/down, Don/Doff left sock, Don/Doff left shoe, Fasten/unfasten right shoe, Fasten/unfasten left shoe, Don/Doff right sock, Don/Doff right shoe Lower body dressing/undressing: 5: Supervision: Safety issues/verbal cues  FIM - Toileting Toileting steps completed by patient: Adjust clothing prior to toileting, Performs perineal hygiene, Adjust clothing after toileting Toileting Assistive Devices: Grab bar or rail for support Toileting: 4: Steadying assist  FIM - Radio producer Devices: Grab bars Toilet Transfers: 4-To toilet/BSC: Min A (steadying Pt. > 75%), 4-From toilet/BSC: Min A (steadying Pt. > 75%)  FIM - Bed/Chair Transfer Bed/Chair Transfer Assistive Devices: Arm rests Bed/Chair Transfer: 5: Chair or W/C > Bed: Supervision (verbal cues/safety issues)  FIM - Locomotion: Wheelchair Distance: 150 Locomotion: Wheelchair: 6: Travels 150 ft or more,  turns around, maneuvers to table, bed or toilet, negotiates 3% grade: maneuvers on rugs and over door sills independently FIM - Locomotion: Ambulation Locomotion: Ambulation Assistive Devices: Nurse, adult Ambulation/Gait Assistance: 4: Min assist Locomotion: Ambulation: 2: Travels 50 - 149 ft with minimal assistance (Pt.>75%)  Comprehension Comprehension Mode: Auditory Comprehension: 5-Understands basic 90% of the time/requires cueing < 10% of the time  Expression Expression Mode: Verbal Expression: 5-Expresses basic 90% of the time/requires cueing < 10% of the time.  Social Interaction Social Interaction: 6-Interacts appropriately with others with medication or extra time (anti-anxiety, antidepressant).  Problem Solving Problem Solving: 5-Solves basic 90% of the time/requires cueing < 10% of the time  Memory Memory: 6-More than reasonable amt of time  Medical Problem List and Plan: 1. 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  DVT Prophylaxis/Anticoagulation: Pharmaceutical: Lovenox 3. Pain Management: N/A 4. Mood: LCSW to follow for evaluation and  support. Has good family support.   5. Neuropsych: This patient is capable of making decisions on his own behalf. 6. Skin/Wound Care: Routine pressure relief measures.   7. Fluids/Electrolytes/Nutrition:now on oral feeding, monitor calories, monitor fluid intake, monitor basic metabolic, IV fluids at night 8. HTN: Monitor BP every 8 hours. off Prinivil because of acute renal failure which was also influenced by inadequate fluid intake  9. Severe dysphagia:D1 Nectar, check BMET on 6/27 since off IVF, hopefully upgrade next wk per SLP     10. DM type 2: Hgb A1C-7.6.fair control Was on metformin 1000 mg bid with Glucotrol XL 10 mg daily. Now on Lantus due to continuous tube feeds.   11. CKD: Baseline Cr- 1.3.  Creat at baseline, given elevated BUN/creatinine will hold off on HCTZ,  if BP up consider Norvasc 12. Dyslipidemia: continue Lipitor.  13. Hand edema R , discussed retrograde massage   LOS (Days) 12 A FACE TO FACE EVALUATION WAS PERFORMED  Jameela Michna E 02/26/2015, 9:40 AM

## 2015-02-26 NOTE — Progress Notes (Signed)
Occupational Therapy Session Note  Patient Details  Name: Russell Austin MRN: [id number] Date of Birth: 1951/07/09  Today's Date: 02/26/2015 OT Individual Time: 1300-1400 OT Individual Time Calculation (min): 60 min    Short Term Goals: Week 2:  OT Short Term Goal 1 (Week 2): Pt will transfer to toilet using RW with steadying A. OT Short Term Goal 2 (Week 2): Pt will don LB clothing with S. OT Short Term Goal 3 (Week 2): Pt will use R arm to wash L arm without A. OT Short Term Goal 4 (Week 2): Pt will be able to stand in the shower with S using grab bars for support.  Skilled Therapeutic Interventions/Progress Updates:  Pt propelled self to therapy gym where he was received with no c/o pain. Pt propelling self via hemiplegic technique to day room for table top activities. Pt engaged in picking up large pegs with coban wrapped around them in order to increase success and placing into resistive peg board with 2/10 drops. Pt then removing pegs from board and placing into container with use of R hand and no drops. Therapist using manual facilitation to decrease compensatory movements in shoulder during this session. Pt utilizing cylinder grasp of R hand to pick up and stack 6 cups. Large grasp in order to squeeze resistive clothespins of 1 and 3 lbs to place onto designated area. Activity graded based on distance reached and size of bar pt must squeeze pin in order to place. Multiple drops during task and pt with greater difficulty placing clothespins onto bar closer to his body. Pt propelled self back to room with supervision. Call bell and all needed items within reach.   Therapy Documentation Precautions:  Precautions Precautions: Fall Precaution Comments: Dys 1 diet, nectar thick Restrictions Weight Bearing Restrictions: No Vital Signs: Therapy Vitals Temp: 97.6 F (36.4 C) Temp Source: Oral Pulse Rate: 67 Resp: 18 BP: (!) 155/65 mmHg Patient Position (if appropriate):  Sitting Oxygen Therapy SpO2: 100 % O2 Device: Not Delivered ADL: ADL ADL Comments: refer to FIM  See FIM for current functional status  Therapy/Group: Individual Therapy  Phineas Semen 02/26/2015, 4:24 PM

## 2015-02-27 ENCOUNTER — Inpatient Hospital Stay (HOSPITAL_COMMUNITY): Payer: BC Managed Care – PPO | Admitting: Occupational Therapy

## 2015-02-27 LAB — GLUCOSE, CAPILLARY
GLUCOSE-CAPILLARY: 145 mg/dL — AB (ref 65–99)
Glucose-Capillary: 85 mg/dL (ref 65–99)
Glucose-Capillary: 167 mg/dL — ABNORMAL HIGH (ref 65–99)
Glucose-Capillary: 239 mg/dL — ABNORMAL HIGH (ref 65–99)

## 2015-02-27 NOTE — Progress Notes (Signed)
Subjective/Complaints: CC:  Hates pured diet Wants to go home as soon as possible ROS- No CP or SOB, no abdominal pain nausea or vomiting, weakness on the right side Objective: Vital Signs: Blood pressure 136/67, pulse 73, temperature 98.6 F (37 C), temperature source Oral, resp. rate 18, weight 80.015 kg (176 lb 6.4 oz), SpO2 100 %. No results found. Results for orders placed or performed during the hospital encounter of 02/14/15 (from the past 72 hour(s))  Glucose, capillary     Status: Abnormal   Collection Time: 02/24/15 12:00 PM  Result Value Ref Range   Glucose-Capillary 187 (H) 65 - 99 mg/dL   Comment 1 Notify RN   Glucose, capillary     Status: Abnormal   Collection Time: 02/24/15  4:47 PM  Result Value Ref Range   Glucose-Capillary 210 (H) 65 - 99 mg/dL  Glucose, capillary     Status: Abnormal   Collection Time: 02/24/15  9:33 PM  Result Value Ref Range   Glucose-Capillary 157 (H) 65 - 99 mg/dL   Comment 1 Notify RN   Basic metabolic panel     Status: Abnormal   Collection Time: 02/25/15  5:18 AM  Result Value Ref Range   Sodium 139 135 - 145 mmol/L   Potassium 4.4 3.5 - 5.1 mmol/L   Chloride 107 101 - 111 mmol/L   CO2 24 22 - 32 mmol/L   Glucose, Bld 180 (H) 65 - 99 mg/dL   BUN 16 6 - 20 mg/dL   Creatinine, Ser 1.08 0.61 - 1.24 mg/dL   Calcium 8.3 (L) 8.9 - 10.3 mg/dL   GFR calc non Af Amer >60 >60 mL/min   GFR calc Af Amer >60 >60 mL/min    Comment: (NOTE) The eGFR has been calculated using the CKD EPI equation. This calculation has not been validated in all clinical situations. eGFR's persistently <60 mL/min signify possible Chronic Kidney Disease.    Anion gap 8 5 - 15  Glucose, capillary     Status: Abnormal   Collection Time: 02/25/15  6:59 AM  Result Value Ref Range   Glucose-Capillary 162 (H) 65 - 99 mg/dL   Comment 1 Notify RN   Glucose, capillary     Status: Abnormal   Collection Time: 02/25/15 11:25 AM  Result Value Ref Range   Glucose-Capillary 237 (H) 65 - 99 mg/dL  Glucose, capillary     Status: Abnormal   Collection Time: 02/25/15  4:40 PM  Result Value Ref Range   Glucose-Capillary 207 (H) 65 - 99 mg/dL  Glucose, capillary     Status: Abnormal   Collection Time: 02/25/15  8:24 PM  Result Value Ref Range   Glucose-Capillary 175 (H) 65 - 99 mg/dL  Glucose, capillary     Status: Abnormal   Collection Time: 02/26/15  6:57 AM  Result Value Ref Range   Glucose-Capillary 165 (H) 65 - 99 mg/dL  Glucose, capillary     Status: Abnormal   Collection Time: 02/26/15 11:24 AM  Result Value Ref Range   Glucose-Capillary 189 (H) 65 - 99 mg/dL  Glucose, capillary     Status: Abnormal   Collection Time: 02/26/15  4:34 PM  Result Value Ref Range   Glucose-Capillary 200 (H) 65 - 99 mg/dL  Glucose, capillary     Status: Abnormal   Collection Time: 02/26/15  9:13 PM  Result Value Ref Range   Glucose-Capillary 311 (H) 65 - 99 mg/dL   Comment 1 Notify RN  Glucose, capillary     Status: None   Collection Time: 02/27/15  6:55 AM  Result Value Ref Range   Glucose-Capillary 85 65 - 99 mg/dL   Comment 1 Notify RN      HEENT: normal Cardio: RRR and no murmur Resp: CTA B/L and unlabored GI: BS positive and NT, ND Extremity:  Pulses positive and mild R hand Edema Skin:   Intact Neuro: Alert/Oriented Right upper extremity 3 minus at the deltoid, biceps, triceps, grip 3 minus at theright hip flexor on the right 4 minus knee extensor, 3 minusright ankle dorsiflexor MAS 3 atrightbiceps,MES to right pectoralis, 1-2 in finger and wrist flexors  Assessment/Plan: 1. 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 Physiatrist is providing close team supervision and 24 hour management of active medical problems listed below. Physiatrist and rehab team continue to assess barriers to  discharge/monitor patient progress toward functional and medical goals. Status post botulinum toxin injection 02/24/2015 to the right pectoralis right biceps and right brachial radialis FIM: FIM - Bathing Bathing Steps Patient Completed: Chest, Abdomen, Front perineal area, Right upper leg, Left upper leg, Right lower leg (including foot), Left lower leg (including foot), Buttocks, Right Arm, Left Arm Bathing: 5: Supervision: Safety issues/verbal cues  FIM - Upper Body Dressing/Undressing Upper body dressing/undressing steps patient completed: Thread/unthread left sleeve of pullover shirt/dress, Pull shirt over trunk, Put head through opening of pull over shirt/dress, Thread/unthread right sleeve of pullover shirt/dresss Upper body dressing/undressing: 6: More than reasonable amount of time FIM - Lower Body Dressing/Undressing Lower body dressing/undressing steps patient completed: Thread/unthread right underwear leg, Thread/unthread left underwear leg, Pull underwear up/down, Thread/unthread right pants leg, Thread/unthread left pants leg, Pull pants up/down, Don/Doff left sock, Don/Doff left shoe, Fasten/unfasten right shoe, Fasten/unfasten left shoe, Don/Doff right sock, Don/Doff right shoe Lower body dressing/undressing: 5: Supervision: Safety issues/verbal cues  FIM - Toileting Toileting steps completed by patient: Adjust clothing prior to toileting, Performs perineal hygiene, Adjust clothing after toileting Toileting Assistive Devices: Grab bar or rail for support Toileting: 4: Steadying assist  FIM - Radio producer Devices: Grab bars Toilet Transfers: 4-To toilet/BSC: Min A (steadying Pt. > 75%), 4-From toilet/BSC: Min A (steadying Pt. > 75%)  FIM - Bed/Chair Transfer Bed/Chair Transfer Assistive Devices: Arm rests Bed/Chair Transfer: 5: Bed > Chair or W/C: Supervision (verbal cues/safety issues)  FIM - Locomotion: Wheelchair Distance: 150 Locomotion:  Wheelchair: 6: Travels 150 ft or more, turns around, maneuvers to table, bed or toilet, negotiates 3% grade: maneuvers on rugs and over door sills independently FIM - Locomotion: Ambulation Locomotion: Ambulation Assistive Devices: Nurse, adult Ambulation/Gait Assistance: 4: Min assist Locomotion: Ambulation: 2: Travels 50 - 149 ft with minimal assistance (Pt.>75%)  Comprehension Comprehension Mode: Auditory Comprehension: 5-Understands basic 90% of the time/requires cueing < 10% of the time  Expression Expression Mode: Verbal Expression: 5-Expresses basic 90% of the time/requires cueing < 10% of the time.  Social Interaction Social Interaction: 6-Interacts appropriately with others with medication or extra time (anti-anxiety, antidepressant).  Problem Solving Problem Solving: 5-Solves basic 90% of the time/requires cueing < 10% of the time  Memory Memory: 6-More than reasonable amt of time  Medical Problem List and Plan: 1. 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  DVT Prophylaxis/Anticoagulation: Pharmaceutical: Lovenox 3. Pain Management: N/A 4. Mood: LCSW to follow for  evaluation and support. Has good family support.   5. Neuropsych: This patient is capable of making decisions on his own behalf. 6. Skin/Wound Care: Routine pressure relief measures.   7. Fluids/Electrolytes/Nutrition:now on oral feeding, monitor calories, monitor fluid intake, monitor basic metabolic, IV fluids at night 8. HTN: Monitor BP every 8 hours. off Prinivil because of acute renal failure which was also influenced by inadequate fluid intake  9. Severe dysphagia:D1 Nectar, check BMET on 6/27 since off IVF, hopefully upgrade next wk per SLP     10. DM type 2: Hgb A1C-7.6.fair control Was on metformin 1000 mg bid with Glucotrol XL 10 mg daily. Now on Lantus due to continuous tube feeds.   11. CKD: Baseline Cr- 1.3.  Creat at baseline, given elevated  BUN/creatinine will hold off on HCTZ, if BP up consider Norvasc 12. Dyslipidemia: continue Lipitor.     LOS (Days) 13 A FACE TO FACE EVALUATION WAS PERFORMED  Russell Austin E 02/27/2015, 10:03 AM

## 2015-02-27 NOTE — Progress Notes (Signed)
Occupational Therapy Session Note  Patient Details  Name: Russell Austin MRN: [id number] Date of Birth: [birth year]  Today's Date: 02/27/2015 OT Individual Time:  -    1400-1500  (60 min)      Short Term Goals: Week 1:  OT Short Term Goal 1 (Week 1): Pt will be able to don LB clothing with min A. OT Short Term Goal 1 - Progress (Week 1): Met OT Short Term Goal 2 (Week 1): Pt will complete a toilet transfer with S. OT Short Term Goal 2 - Progress (Week 1): Met (using a squat pivot only with S) OT Short Term Goal 3 (Week 1): Pt will be able to bathe with S. OT Short Term Goal 3 - Progress (Week 1): Met (using long handled sponge) OT Short Term Goal 4 (Week 1): Pt will be able to use his R arm to wash his L arm with min A. OT Short Term Goal 4 - Progress (Week 1): Met OT Short Term Goal 5 (Week 1): Pt will be able to stand with steadying A as he washes his bottom in shower. OT Short Term Goal 5 - Progress (Week 1): Met Week 2:  OT Short Term Goal 1 (Week 2): Pt will transfer to toilet using RW with steadying A. OT Short Term Goal 2 (Week 2): Pt will don LB clothing with S. OT Short Term Goal 3 (Week 2): Pt will use R arm to wash L arm without A. OT Short Term Goal 4 (Week 2): Pt will be able to stand in the shower with S using grab bars for support.  Skilled Therapeutic Interventions/Progress Updates:    Pt. Propelled self from room to gym.  He transferred to mat witrh SBA.  Performed RUE forward movements with no pain; Did sho. External and scapular retration with 3/10 pain in anterior shoulder region.  Performed isometric holding in all directions with shoulder at 90 degrees.; Did scapular retraction with minimal resisitance.  Provided shoulder rotations, hand to mouth, supination/pronation, wrist flexion/extension, retrograde massage.  Instructed pt to practice supination/pronation with RUE with shoulder flexion at 80 degrees and shoulder in neutral at zero degrees.  Pt. Transferred  back to wc with SBA with stand turn technique.  Pt propelled self back to room and left with all needs in place.    Therapy Documentation Precautions:  Precautions Precautions: Fall Precaution Comments: Dys 1 diet, nectar thick Restrictions Weight Bearing Restrictions: No      Pain:  None execept 3/10 with stretching in R shoulder.      ADL: ADL ADL Comments: refer to FIM Exercises:SEE abvoe   :    See FIM for current functional status  Therapy/Group: Individual Therapy  Lisa Roca 02/27/2015, 2:20 PM

## 2015-02-28 ENCOUNTER — Inpatient Hospital Stay (HOSPITAL_COMMUNITY): Payer: BC Managed Care – PPO

## 2015-02-28 ENCOUNTER — Ambulatory Visit (HOSPITAL_COMMUNITY): Payer: BC Managed Care – PPO | Admitting: Speech Pathology

## 2015-02-28 ENCOUNTER — Inpatient Hospital Stay (HOSPITAL_COMMUNITY): Payer: BC Managed Care – PPO | Admitting: Occupational Therapy

## 2015-02-28 ENCOUNTER — Encounter (HOSPITAL_COMMUNITY): Payer: BC Managed Care – PPO

## 2015-02-28 LAB — GLUCOSE, CAPILLARY
Glucose-Capillary: 99 mg/dL (ref 65–99)
Glucose-Capillary: 180 mg/dL — ABNORMAL HIGH (ref 65–99)
Glucose-Capillary: 184 mg/dL — ABNORMAL HIGH (ref 65–99)
Glucose-Capillary: 180 mg/dL — ABNORMAL HIGH (ref 65–99)

## 2015-02-28 LAB — BASIC METABOLIC PANEL
Anion gap: 3 — ABNORMAL LOW (ref 5–15)
BUN: 14 mg/dL (ref 6–20)
CO2: 28 mmol/L (ref 22–32)
CREATININE: 1.04 mg/dL (ref 0.61–1.24)
Calcium: 8.7 mg/dL — ABNORMAL LOW (ref 8.9–10.3)
Chloride: 110 mmol/L (ref 101–111)
GFR calc non Af Amer: >60 mL/min (ref >60)
GLUCOSE: 104 mg/dL — AB (ref 65–99)
Potassium: 4 mmol/L (ref 3.5–5.1)
Sodium: 141 mmol/L (ref 135–145)

## 2015-02-28 MED ORDER — GLIPIZIDE 5 MG PO TABS
5.0000 mg | ORAL_TABLET | Freq: Every day | ORAL | Status: DC
  Administered 2015-03-01 – 2015-03-03 (×3): 5 mg via ORAL
  Filled 2015-02-28 (×4): qty 1

## 2015-02-28 MED ORDER — HYDROCHLOROTHIAZIDE 12.5 MG PO CAPS
12.5000 mg | ORAL_CAPSULE | Freq: Every day | ORAL | Status: DC
  Administered 2015-02-28 – 2015-03-03 (×4): 12.5 mg via ORAL
  Filled 2015-02-28 (×6): qty 1

## 2015-02-28 NOTE — Progress Notes (Signed)
Speech Language Pathology Daily Session Note  Patient Details  Name: WARDEN BUFFA MRN: [id number] Date of Birth: December 28, 1950  Today's Date: 02/28/2015 SLP Individual Time: [id number] SLP Individual Time Calculation (min): 45 min  Short Term Goals: Week 2: SLP Short Term Goal 1 (Week 2): Pt will demonstrate minimal overt s/s aspiration with trials of ice chips with mod I use of swallowing precautions.  SLP Short Term Goal 1 - Progress (Week 2): Updated due to goal met SLP Short Term Goal 2 (Week 2): Pt will perform pharyngeal strengthening exercises with mod I SLP Short Term Goal 3 (Week 2): Pt will consume dys 1 textures and nectar thick liquids with mod I use of swallowing precautions.    Skilled Therapeutic Interventions:  Pt was seen for skilled ST targeting dysphagia goals.  Upon arrival, pt was seated upright in wheelchair, awake, alert, and agreeable to participate in ST.  SLP facilitated the session with a trial dys 2 meal tray to continue working towards diet advancement.  Pt consumed advanced textures with supervision cues for use of swallowing precautions.  He demonstrated an intermittent soft, delayed throat clear following dys 2 sausage which SLP suspects to be related to sensed pharyngeal residue.  He also required x1 verbal cue for recall of nectar thick liquids via teaspoon.  Recommend that pt's diet be advanced to dys 2 textures with continued nectar thick liquids and intermittent supervision for use of swallowing precautions.  Continue per current plan of care.    FIM:  Comprehension Comprehension Mode: Auditory Comprehension: 5-Understands basic 90% of the time/requires cueing < 10% of the time Expression Expression Mode: Verbal Expression: 5-Expresses basic needs/ideas: With extra time/assistive device Social Interaction Social Interaction: 6-Interacts appropriately with others with medication or extra time (anti-anxiety, antidepressant). Problem Solving Problem  Solving: 5-Solves basic problems: With no assist Memory Memory: 6-More than reasonable amt of time FIM - Eating Eating Activity: 5: Supervision/cues  Pain Pain Assessment Pain Assessment: No/denies pain  Therapy/Group: Individual Therapy  Julieta Rogalski, Selinda Orion 02/28/2015, 8:55 AM

## 2015-02-28 NOTE — Progress Notes (Signed)
Physical Therapy Session Note  Patient Details  Name: Russell Austin MRN: [id number] Date of Birth: 04-Jul-1951  Today's Date: 02/28/2015 PT Individual Time: 1300-1430 PT Individual Time Calculation (min): 90 min   Short Term Goals: Week 2:  PT Short Term Goal 1 (Week 2): = LTG of MOD I overall for D/C 03/04/15  Skilled Therapeutic Interventions/Progress Updates:    Patient in wheelchair propels to therapy mod I.  Standing at counter for HEP instruction pt participated in gastroc and soleus stretches mod cues, standing hip abduction, hamstring curls, heel and toe raises, squats and seated LAQ; handout given, but pt educated to have assist for standing HEP for now for safety.  Patient transferred to mat supervision min cues for wheelchair set up.  Supine on mat for active and passive stretches to hamstrings, piriformis, ITB, and gluts 2 x 30 sec holds.  Supine on mat for NMR for right trunk weight bearing, activation and awareness with mod facilitation and cues.  Patient ambulated with SPC, then [facility], then RW each approx 150-250' (w/ RW) min assist to supervision multiple loss of balance on turns and with gait on carpeted surfaces.  Cues for smaller steps with turns and to ambulate smaller steps as well as stay focused as demonstrates increased gait abnormalities with distractions including right circumduction and longer ataxic steps, increased toe drag and poor right side awareness.   Furniture transfers supervision and cues.  Seated to discuss safety issues and need for continued concentration and gait modifications for safety.  Patient ambulated back to room with RW supervision to occ minguard for safety x over 200'.  Left sitting in w/c all needs accessible.  Therapy Documentation Precautions:  Precautions Precautions: Fall Precaution Comments: Dys 2 diet, nectar thick Restrictions Weight Bearing Restrictions: No Vital Signs: Therapy Vitals Temp: 97.6 F (36.4 C) Temp Source:  Oral Pulse Rate: 72 Resp: 18 BP: 132/63 mmHg Patient Position (if appropriate): Sitting Oxygen Therapy SpO2: 100 % O2 Device: Not Delivered Locomotion : Ambulation Ambulation/Gait Assistance: 4: Min assist Wheelchair Mobility Distance: 150   See FIM for current functional status  Therapy/Group: Individual Therapy  [place], [place] 02/28/2015  02/28/2015, 4:47 PM

## 2015-02-28 NOTE — Progress Notes (Signed)
Subjective/Complaints: CC:  Legs are swelling  ROS- No CP or SOB, no abdominal pain nausea or vomiting, weakness on the right side Objective: Vital Signs: Blood pressure 129/70, pulse 69, temperature 98.2 F (36.8 C), temperature source Oral, resp. rate 18, weight 76 kg (167 lb 8.8 oz), SpO2 100 %. No results found. Results for orders placed or performed during the hospital encounter of 02/14/15 (from the past 72 hour(s))  Glucose, capillary     Status: Abnormal   Collection Time: 02/25/15 11:25 AM  Result Value Ref Range   Glucose-Capillary 237 (H) 65 - 99 mg/dL  Glucose, capillary     Status: Abnormal   Collection Time: 02/25/15  4:40 PM  Result Value Ref Range   Glucose-Capillary 207 (H) 65 - 99 mg/dL  Glucose, capillary     Status: Abnormal   Collection Time: 02/25/15  8:24 PM  Result Value Ref Range   Glucose-Capillary 175 (H) 65 - 99 mg/dL  Glucose, capillary     Status: Abnormal   Collection Time: 02/26/15  6:57 AM  Result Value Ref Range   Glucose-Capillary 165 (H) 65 - 99 mg/dL  Glucose, capillary     Status: Abnormal   Collection Time: 02/26/15 11:24 AM  Result Value Ref Range   Glucose-Capillary 189 (H) 65 - 99 mg/dL  Glucose, capillary     Status: Abnormal   Collection Time: 02/26/15  4:34 PM  Result Value Ref Range   Glucose-Capillary 200 (H) 65 - 99 mg/dL  Glucose, capillary     Status: Abnormal   Collection Time: 02/26/15  9:13 PM  Result Value Ref Range   Glucose-Capillary 311 (H) 65 - 99 mg/dL   Comment 1 Notify RN   Glucose, capillary     Status: None   Collection Time: 02/27/15  6:55 AM  Result Value Ref Range   Glucose-Capillary 85 65 - 99 mg/dL   Comment 1 Notify RN   Glucose, capillary     Status: Abnormal   Collection Time: 02/27/15 11:23 AM  Result Value Ref Range   Glucose-Capillary 167 (H) 65 - 99 mg/dL  Glucose, capillary     Status: Abnormal   Collection Time: 02/27/15  4:21 PM  Result Value Ref Range   Glucose-Capillary 145 (H) 65 -  99 mg/dL  Glucose, capillary     Status: Abnormal   Collection Time: 02/27/15  8:48 PM  Result Value Ref Range   Glucose-Capillary 239 (H) 65 - 99 mg/dL  Basic metabolic panel     Status: Abnormal   Collection Time: 02/28/15  6:04 AM  Result Value Ref Range   Sodium 141 135 - 145 mmol/L   Potassium 4.0 3.5 - 5.1 mmol/L   Chloride 110 101 - 111 mmol/L   CO2 28 22 - 32 mmol/L   Glucose, Bld 104 (H) 65 - 99 mg/dL   BUN 14 6 - 20 mg/dL   Creatinine, Ser 1.04 0.61 - 1.24 mg/dL   Calcium 8.7 (L) 8.9 - 10.3 mg/dL   GFR calc non Af Amer >60 >60 mL/min   GFR calc Af Amer >60 >60 mL/min    Comment: (NOTE) The eGFR has been calculated using the CKD EPI equation. This calculation has not been validated in all clinical situations. eGFR's persistently <60 mL/min signify possible Chronic Kidney Disease.    Anion gap 3 (L) 5 - 15    Comment: REPEATED TO VERIFY  Glucose, capillary     Status: None   Collection Time: 02/28/15  6:32 AM  Result Value Ref Range   Glucose-Capillary 99 65 - 99 mg/dL     HEENT: normal Cardio: RRR and no murmur Resp: CTA B/L and unlabored GI: BS positive and NT, ND Extremity:  Pulses positive and mild R hand Edema Skin:   Intact Neuro: Alert/Oriented Right upper extremity 3 minus at the deltoid, biceps, triceps, grip 3 minus at theright hip flexor on the right 4 minus knee extensor, 3 minusright ankle dorsiflexor MAS 3 atrightbiceps,MES to right pectoralis, 1-2 in finger and wrist flexors  Assessment/Plan: 1. 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 Physiatrist is providing close team supervision and 24 hour management of active medical problems listed below. Physiatrist and rehab team continue to assess barriers to discharge/monitor patient progress toward functional and medical goals. Status post botulinum toxin  injection 02/24/2015 to the right pectoralis right biceps and right brachial radialis FIM: FIM - Bathing Bathing Steps Patient Completed: Chest, Abdomen, Front perineal area, Right upper leg, Left upper leg, Right lower leg (including foot), Left lower leg (including foot), Buttocks, Right Arm, Left Arm Bathing: 5: Supervision: Safety issues/verbal cues  FIM - Upper Body Dressing/Undressing Upper body dressing/undressing steps patient completed: Thread/unthread left sleeve of pullover shirt/dress, Pull shirt over trunk, Put head through opening of pull over shirt/dress, Thread/unthread right sleeve of pullover shirt/dresss Upper body dressing/undressing: 6: More than reasonable amount of time FIM - Lower Body Dressing/Undressing Lower body dressing/undressing steps patient completed: Thread/unthread right underwear leg, Thread/unthread left underwear leg, Pull underwear up/down, Thread/unthread right pants leg, Thread/unthread left pants leg, Pull pants up/down, Don/Doff left sock, Don/Doff left shoe, Fasten/unfasten right shoe, Fasten/unfasten left shoe, Don/Doff right sock, Don/Doff right shoe Lower body dressing/undressing: 5: Supervision: Safety issues/verbal cues  FIM - Toileting Toileting steps completed by patient: Adjust clothing prior to toileting, Performs perineal hygiene, Adjust clothing after toileting Toileting Assistive Devices: Grab bar or rail for support Toileting: 4: Steadying assist  FIM - Radio producer Devices: Grab bars Toilet Transfers: 4-To toilet/BSC: Min A (steadying Pt. > 75%), 4-From toilet/BSC: Min A (steadying Pt. > 75%)  FIM - Bed/Chair Transfer Bed/Chair Transfer Assistive Devices: Arm rests Bed/Chair Transfer: 5: Bed > Chair or W/C: Supervision (verbal cues/safety issues)  FIM - Locomotion: Wheelchair Distance: 150 Locomotion: Wheelchair: 6: Travels 150 ft or more, turns around, maneuvers to table, bed or toilet, negotiates 3%  grade: maneuvers on rugs and over door sills independently FIM - Locomotion: Ambulation Locomotion: Ambulation Assistive Devices: Nurse, adult Ambulation/Gait Assistance: 4: Min assist Locomotion: Ambulation: 2: Travels 50 - 149 ft with minimal assistance (Pt.>75%)  Comprehension Comprehension Mode: Auditory Comprehension: 5-Understands basic 90% of the time/requires cueing < 10% of the time  Expression Expression Mode: Verbal Expression: 5-Expresses basic needs/ideas: With extra time/assistive device  Social Interaction Social Interaction: 6-Interacts appropriately with others with medication or extra time (anti-anxiety, antidepressant).  Problem Solving Problem Solving: 5-Solves basic problems: With no assist  Memory Memory: 6-More than reasonable amt of time  Medical Problem List and Plan: 1. 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  DVT Prophylaxis/Anticoagulation: Pharmaceutical: Lovenox 3. Pain Management: N/A 4. Mood: LCSW to follow for evaluation and support. Has good family support.   5. Neuropsych: This patient is capable of making decisions on his own behalf. 6. Skin/Wound Care: Routine pressure relief measures.   7.  Fluids/Electrolytes/Nutrition:now on oral feeding, monitor calories, monitor fluid intake, monitor basic metabolic, IV fluids at night 8. HTN: Monitor BP every 8 hours. off Prinivil because of acute renal failure which was also influenced by inadequate fluid intake  9. Severe dysphagia:D1 Nectar, check BMET on 6/27 since off IVF, hopefully upgrade next wk per SLP     10. DM type 2: Hgb A1C-7.6.fair control Was on metformin 1000 mg bid with Glucotrol XL 10 mg daily.CBGs creeping up am CBG ok on Lantus 23 U  Add glucotrol 73m 11. CKD: Baseline Cr- 1.3.  Creat at baseline, intake better, leg swelling , restart low dose HCTZ 12. Dyslipidemia: continue Lipitor.     LOS (Days) 14 A FACE TO FACE  EVALUATION WAS PERFORMED  KIRSTEINS,ANDREW E 02/28/2015, 8:32 AM

## 2015-02-28 NOTE — Progress Notes (Signed)
Occupational Therapy Session Note  Patient Details  Name: Russell Austin MRN: [id number] Date of Birth: Apr 27, 1951  Today's Date: 02/28/2015 OT Individual Time: 1020-1135 OT Individual Time Calculation (min): 75 min    Short Term Goals: Week 1:  OT Short Term Goal 1 (Week 1): Pt will be able to don LB clothing with min A. OT Short Term Goal 1 - Progress (Week 1): Met OT Short Term Goal 2 (Week 1): Pt will complete a toilet transfer with S. OT Short Term Goal 2 - Progress (Week 1): Met (using a squat pivot only with S) OT Short Term Goal 3 (Week 1): Pt will be able to bathe with S. OT Short Term Goal 3 - Progress (Week 1): Met (using long handled sponge) OT Short Term Goal 4 (Week 1): Pt will be able to use his R arm to wash his L arm with min A. OT Short Term Goal 4 - Progress (Week 1): Met OT Short Term Goal 5 (Week 1): Pt will be able to stand with steadying A as he washes his bottom in shower. OT Short Term Goal 5 - Progress (Week 1): Met Week 2:  OT Short Term Goal 1 (Week 2): Pt will transfer to toilet using RW with steadying A. OT Short Term Goal 2 (Week 2): Pt will don LB clothing with S. OT Short Term Goal 3 (Week 2): Pt will use R arm to wash L arm without A. OT Short Term Goal 4 (Week 2): Pt will be able to stand in the shower with S using grab bars for support.     Skilled Therapeutic Interventions/Progress Updates:    Pt seen this session for RUE NMR with a focus on smooth motor patterns. Over the weekend, pt has developed a great deal of AROM and motor control, therefore pt was able to engage in multiple challenging tasks and completed them with supervision of slight guiding A: -Supine PNF D1, D2 with reaching for small golf and ping pong balls and placing them in a bucket 12X each across midline working on a abdominal crunch -Repeated D1, 2 in sitting reaching over head for balls D1 and to shoulder D2 then rotating across midline 12x each, excellent grasp on small  balls and smooth movement into extension -supine elbow extension overhead and placing and holding arm overhead by punching to the ceiling -sitting BUE ball holds with chest presses and trunk rotation, ball holds with sit >< stand with steadying A focusing on smooth eccentric control into sitting -BUE holds on small hula hoop to rotate R arm over head 15x, then pushing arms forward in chest press lifting over head and then pulling hula hoop over his head for full shoulder press 15 x - isolated wrist extension with fingers closed and then full extension of fingers with wrist extended 15x -Isolated thumb prehension. Pt can now independently touch thumb to first 2 digits, assist to touch to ring finger and pinky.  Pt provided with nut and bolt to practice twisting to work on his prehension -trunk control exercises with sitting on balance disk with lateral hip elevation and reaching out of his BOS to his R to maintain balance -standing, hip sways to isolate lower pelvis from trunk -standing, with support pt worked on shifting wt from toes to heels  Pt transferred back to w/c and then took himself back to his room.     Therapy Documentation Precautions:  Precautions Precautions: Fall Precaution Comments: Dys 2 diet, nectar thick Restrictions  Weight Bearing Restrictions: No    Pain: Pain Assessment Pain Assessment: No/denies pain ADL: ADL ADL Comments: refer to FIM  See FIM for current functional status  Therapy/Group: Individual Therapy  [place] 02/28/2015, 11:55 AM

## 2015-02-28 NOTE — Progress Notes (Signed)
Physical Therapy Session Note  Patient Details  Name: Russell Austin MRN: [id number] Date of Birth: 29-Jun-1951  Today's Date: 02/28/2015 PT Individual Time: [id number] PT Individual Time Calculation (min): 30 min   Short Term Goals: Week 2:  PT Short Term Goal 1 (Week 2): = LTG of MOD I overall for D/C 03/01/15  Skilled Therapeutic Interventions/Progress Updates:    Session focused on addressing fall recovery and floor transfers including performing this x 2 and reviewing what to do in case of fall at home (pt able to verbally recall back to therapist this information correctly) as well as neuro re-ed in tall kneeling position to address proximal weakness, balance, WB, and core strengthening. Performed partial squats in tall kneeling x 10 reps with UE support for balance, lateral "stepping" to R and L on knee along EOM while in tall kneeling position x 2 reps to each side with cues for activating in RLE, and maintaining balance without UE while performing reaching task with each UE. Pt performed these transitional movements initially with steady A and verbal cues for attention to RUE but then progressing to S with repetition. Returned to room using BLE only for propulsion for functional strengthening and attention and coordination of RLE especially. All needs in reach when pt left in room in w/c.  Therapy Documentation Precautions:  Precautions Precautions: Fall Precaution Comments: Dys 2 diet, nectar thick Restrictions Weight Bearing Restrictions: No  Pain:  Denies pain.   See FIM for current functional status  Therapy/Group: Individual Therapy  Russell Austin, PT, DPT  02/28/2015, 3:58 PM

## 2015-02-28 NOTE — Progress Notes (Signed)
Recreational Therapy Session Note  Patient Details  Name: Russell Austin MRN: [id number] Date of Birth: [birth year] Today's Date: 02/28/2015  Pain: no c/o Skilled Therapeutic Interventions/Progress Updates: Briefly discussed community reintegration/outing to [name], pt agreeable to participate.  Discussed purpose of outing and potential goals.  Therapy/Group: Individual Therapy   Lilyana Lippman 02/28/2015, 4:33 PM

## 2015-03-01 ENCOUNTER — Inpatient Hospital Stay (HOSPITAL_COMMUNITY): Payer: BC Managed Care – PPO | Admitting: Occupational Therapy

## 2015-03-01 ENCOUNTER — Inpatient Hospital Stay (HOSPITAL_COMMUNITY): Payer: BC Managed Care – PPO | Admitting: Speech Pathology

## 2015-03-01 ENCOUNTER — Inpatient Hospital Stay (HOSPITAL_COMMUNITY): Payer: BC Managed Care – PPO

## 2015-03-01 LAB — GLUCOSE, CAPILLARY
GLUCOSE-CAPILLARY: 221 mg/dL — AB (ref 65–99)
Glucose-Capillary: 82 mg/dL (ref 65–99)
Glucose-Capillary: 57 mg/dL — ABNORMAL LOW (ref 65–99)
Glucose-Capillary: 92 mg/dL (ref 65–99)
Glucose-Capillary: 238 mg/dL — ABNORMAL HIGH (ref 65–99)

## 2015-03-01 NOTE — Consult Note (Signed)
  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    According to medical records, 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 male with history of DM type 2 with retinopathy, HTN, glaucoma, who was admitted on 02/09/15 with reports of  heaviness of RUE/RLE with intermittent numbness, pins and needles since waking that am.MRI/MRA brain was done revealing left paramedian pontine and upper medullary infarct most consistent with small vessel disease.Dr Erlinda Hong evaluated patient and recommended ASA for left medullary infarct most likely due to small vessel disease.On 06/11 am, patient had increase in right sided weakness and MRI of brain was done revealing evolutionary change with slight increase in left paramedian infarct. Neurology felt that this was natural progression and no new recommendations noted."   During today's visit, [name] denied suffering from any cognitive difficulties post-stroke. He described his current mood as "upbeat." He has no history of mental health issues or treatment, nor has he suffered any prolonged periods of depression or anxiety throughout his life. No adjustment issues endorsed. Suicidal/homicidal ideation, plan or intent was denied. No manic or hypomanic episodes were reported. The patient denied ever experiencing any auditory/visual hallucinations. No major behavioral or personality changes were endorsed.   [name] feels that he is making progress in therapy and described the rehab staff as "wonderful." No barriers to therapy identified. He has his  wife and family visit regularly to provide social support.   PROCEDURES ADMINISTERED: [1 unit 90791] Diagnostic clinical interview  Review of available records   SUMMARY & IMPRESSION: Overall, [name] denied suffering from any cognitive or emotional symptoms post-stroke. He is adjusting well to his present medical situation. I have no reason to suspect that he is experiencing cognitive symptoms that he might be unaware of so I will not perform any formal cognitive screen at this time (unless requested by the rehab staff). I also do not see the need to follow Mr. Montesano for supportive psychotherapy unless his emotional status changes. If so, we are happy to follow-up at the staff or the patient's behest.    [name], [name]  Clinical Neuropsychologist

## 2015-03-01 NOTE — Progress Notes (Signed)
Recreational Therapy Session Note  Patient Details  Name: Russell Austin MRN: [id number] Date of Birth: 01/20/1951 Today's Date: 03/01/2015  Pain: no c/o Skilled Therapeutic Interventions/Progress Updates: Pt participated in community reintegration/outing to [facility] with focus on safe functional mobility on various community surfaces using w/c or RW, identification & negotiation of obstacles, adherence to back precautions, standing balance, safety awareness, accessing public restroom & energy conservation techniques. Pt participated at supervision level w/c level and supervision-contact guard assist for standing and ambulation. See outing goal sheet in shadow chart for full details.   Therapy/Group: [employer] Lynann Demetrius 03/01/2015, 2:17 PM

## 2015-03-01 NOTE — Progress Notes (Signed)
Orthopedic Tech Progress Note Patient Details:  Russell Austin October 04, 1950 [id number] Brace order completed by [employer] vendor. Patient ID: Russell Austin, male   DOB: July 21, 1951, 64 y.o.   MRN: [id number]   Russell Austin 03/01/2015, 5:18 PM

## 2015-03-01 NOTE — Plan of Care (Signed)
Problem: RH Car Transfers Goal: LTG Patient will perform car transfers with assist (PT) LTG: Patient will perform car transfers with assistance (PT).  Goal downgraded due to sensory deficit right LE.  Problem: RH Ambulation Goal: LTG Patient will ambulate in controlled environment (PT) LTG: Patient will ambulate in a controlled environment, # of feet with assistance (PT).  Goal downgraded due to sensory deficit right LE Goal: LTG Patient will ambulate in home environment (PT) LTG: Patient will ambulate in home environment, # of feet with assistance (PT).  Goal downgraded due to right LE sensory deficit  Problem: RH Stairs Goal: LTG Patient will ambulate up and down stairs w/assist (PT) LTG: Patient will ambulate up and down # of stairs with assistance (PT)  Goal downgraded due to right LE sensory deficit

## 2015-03-01 NOTE — Progress Notes (Signed)
Subjective/Complaints: CC:  Legs are swelling Urinating more per pt What else can I do? ROS- No CP or SOB, no abdominal pain nausea or vomiting, weakness on the right side Objective: Vital Signs: Blood pressure 136/64, pulse 70, temperature 98.2 F (36.8 C), temperature source Oral, resp. rate 18, weight 78.9 kg (173 lb 15.1 oz), SpO2 100 %. No results found. Results for orders placed or performed during the hospital encounter of 02/14/15 (from the past 72 hour(s))  Glucose, capillary     Status: Abnormal   Collection Time: 02/26/15 11:24 AM  Result Value Ref Range   Glucose-Capillary 189 (H) 65 - 99 mg/dL  Glucose, capillary     Status: Abnormal   Collection Time: 02/26/15  4:34 PM  Result Value Ref Range   Glucose-Capillary 200 (H) 65 - 99 mg/dL  Glucose, capillary     Status: Abnormal   Collection Time: 02/26/15  9:13 PM  Result Value Ref Range   Glucose-Capillary 311 (H) 65 - 99 mg/dL   Comment 1 Notify RN   Glucose, capillary     Status: None   Collection Time: 02/27/15  6:55 AM  Result Value Ref Range   Glucose-Capillary 85 65 - 99 mg/dL   Comment 1 Notify RN   Glucose, capillary     Status: Abnormal   Collection Time: 02/27/15 11:23 AM  Result Value Ref Range   Glucose-Capillary 167 (H) 65 - 99 mg/dL  Glucose, capillary     Status: Abnormal   Collection Time: 02/27/15  4:21 PM  Result Value Ref Range   Glucose-Capillary 145 (H) 65 - 99 mg/dL  Glucose, capillary     Status: Abnormal   Collection Time: 02/27/15  8:48 PM  Result Value Ref Range   Glucose-Capillary 239 (H) 65 - 99 mg/dL  Basic metabolic panel     Status: Abnormal   Collection Time: 02/28/15  6:04 AM  Result Value Ref Range   Sodium 141 135 - 145 mmol/L   Potassium 4.0 3.5 - 5.1 mmol/L   Chloride 110 101 - 111 mmol/L   CO2 28 22 - 32 mmol/L   Glucose, Bld 104 (H) 65 - 99 mg/dL   BUN 14 6 - 20 mg/dL   Creatinine, Ser 1.04 0.61 - 1.24 mg/dL   Calcium 8.7 (L) 8.9 - 10.3 mg/dL   GFR calc non Af  Amer >60 >60 mL/min   GFR calc Af Amer >60 >60 mL/min    Comment: (NOTE) The eGFR has been calculated using the CKD EPI equation. This calculation has not been validated in all clinical situations. eGFR's persistently <60 mL/min signify possible Chronic Kidney Disease.    Anion gap 3 (L) 5 - 15    Comment: REPEATED TO VERIFY  Glucose, capillary     Status: None   Collection Time: 02/28/15  6:32 AM  Result Value Ref Range   Glucose-Capillary 99 65 - 99 mg/dL  Glucose, capillary     Status: Abnormal   Collection Time: 02/28/15 12:07 PM  Result Value Ref Range   Glucose-Capillary 180 (H) 65 - 99 mg/dL  Glucose, capillary     Status: Abnormal   Collection Time: 02/28/15  4:30 PM  Result Value Ref Range   Glucose-Capillary 184 (H) 65 - 99 mg/dL  Glucose, capillary     Status: Abnormal   Collection Time: 02/28/15  9:04 PM  Result Value Ref Range   Glucose-Capillary 180 (H) 65 - 99 mg/dL  Glucose, capillary  Status: None   Collection Time: 03/01/15  6:41 AM  Result Value Ref Range   Glucose-Capillary 82 65 - 99 mg/dL     HEENT: normal Cardio: RRR and no murmur Resp: CTA B/L and unlabored GI: BS positive and NT, ND Extremity:  Pulses positive and mild R hand Edema Skin:   Intact Neuro: Alert/Oriented Right upper extremity 3 minus at the deltoid, biceps, triceps, grip 3 minus at theright hip flexor on the right 4 minus knee extensor, 3 minusright ankle dorsiflexor MAS 3 atrightbiceps,MES to right pectoralis, 1-2 in finger and wrist flexors  Assessment/Plan: 1. 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 Physiatrist is providing close team supervision and 24 hour management of active medical problems listed below. Physiatrist and rehab team continue to assess barriers to discharge/monitor patient progress toward functional and  medical goals. Status post botulinum toxin injection 02/24/2015 to the right pectoralis right biceps and right brachial radialis FIM: FIM - Bathing Bathing Steps Patient Completed: Chest, Abdomen, Front perineal area, Right upper leg, Left upper leg, Right lower leg (including foot), Left lower leg (including foot), Buttocks, Right Arm, Left Arm Bathing: 5: Supervision: Safety issues/verbal cues  FIM - Upper Body Dressing/Undressing Upper body dressing/undressing steps patient completed: Thread/unthread left sleeve of pullover shirt/dress, Pull shirt over trunk, Put head through opening of pull over shirt/dress, Thread/unthread right sleeve of pullover shirt/dresss Upper body dressing/undressing: 6: More than reasonable amount of time FIM - Lower Body Dressing/Undressing Lower body dressing/undressing steps patient completed: Thread/unthread right underwear leg, Thread/unthread left underwear leg, Pull underwear up/down, Thread/unthread right pants leg, Thread/unthread left pants leg, Pull pants up/down, Don/Doff left sock, Don/Doff left shoe, Fasten/unfasten right shoe, Fasten/unfasten left shoe, Don/Doff right sock, Don/Doff right shoe Lower body dressing/undressing: 5: Supervision: Safety issues/verbal cues  FIM - Toileting Toileting steps completed by patient: Adjust clothing prior to toileting, Performs perineal hygiene, Adjust clothing after toileting Toileting Assistive Devices: Grab bar or rail for support Toileting: 4: Steadying assist  FIM - Radio producer Devices: Grab bars Toilet Transfers: 4-To toilet/BSC: Min A (steadying Pt. > 75%), 4-From toilet/BSC: Min A (steadying Pt. > 75%)  FIM - Bed/Chair Transfer Bed/Chair Transfer Assistive Devices: Arm rests Bed/Chair Transfer: 4: Supine > Sit: Min A (steadying Pt. > 75%/lift 1 leg), 5: Sit > Supine: Supervision (verbal cues/safety issues), 5: Bed > Chair or W/C: Supervision (verbal cues/safety issues),  5: Chair or W/C > Bed: Supervision (verbal cues/safety issues)  FIM - Locomotion: Wheelchair Distance: 150 Locomotion: Wheelchair: 6: Travels 150 ft or more, turns around, maneuvers to table, bed or toilet, negotiates 3% grade: maneuvers on rugs and over door sills independently FIM - Locomotion: Ambulation Locomotion: Ambulation Assistive Devices: Cane - Quad, Environmental consultant - Rolling, Journalist, newspaper Ambulation/Gait Assistance: 4: Min assist Locomotion: Ambulation: 4: Travels 150 ft or more with minimal assistance (Pt.>75%)  Comprehension Comprehension Mode: Auditory Comprehension: 5-Understands complex 90% of the time/Cues < 10% of the time  Expression Expression Mode: Verbal Expression: 5-Expresses complex 90% of the time/cues < 10% of the time  Social Interaction Social Interaction: 6-Interacts appropriately with others with medication or extra time (anti-anxiety, antidepressant).  Problem Solving Problem Solving: 5-Solves complex 90% of the time/cues < 10% of the time  Memory Memory: 6-More than reasonable amt of time  Medical Problem List and Plan: 1. 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  DVT Prophylaxis/Anticoagulation: Pharmaceutical: Lovenox 3. Pain Management: N/A 4. Mood: LCSW to follow for evaluation and support. Has good family support.   5. Neuropsych: This patient is capable of making decisions on his own behalf. 6. Skin/Wound Care: Routine pressure relief measures.   7. Fluids/Electrolytes/Nutrition:now on oral feeding, monitor calories, monitor fluid intake, monitor basic metabolic, 8. HTN: Monitor BP every 8 hours. off Prinivil because of acute renal failure which was also influenced by inadequate fluid intake  9. Severe dysphagia:D1 Nectar, check BMET on 6/27 normal,off IVF  10. DM type 2: Hgb A1C-7.6.fair control Was on metformin 1000 mg bid with Glucotrol XL 10 mg daily.CBGs creeping up am CBG ok on  Lantus 23 U  Add glucotrol 21m 11. CKD: Baseline Cr- 1.3.  Creat at baseline, intake better, leg swelling , restart low dose HCTZ 12. Dyslipidemia: continue Lipitor.     LOS (Days) 15 A FACE TO FACE EVALUATION WAS PERFORMED  KIRSTEINS,ANDREW E 03/01/2015, 8:59 AM

## 2015-03-01 NOTE — Plan of Care (Signed)
Problem: RH SKIN INTEGRITY Goal: RH STG SKIN FREE OF INFECTION/BREAKDOWN Free of skin breakdown minimal assist  Outcome: Progressing No skin breakdown noted

## 2015-03-01 NOTE — Progress Notes (Signed)
Orthopedic Tech Progress Note Patient Details:  Russell Austin 12/27/1950 [id number]  [facility] called for brace order Patient ID: Russell Austin, male   DOB: 01-Apr-1951, 64 y.o.   MRN: [id number]   Russell Austin 03/01/2015, 10:34 AM

## 2015-03-01 NOTE — Progress Notes (Signed)
Speech Language Pathology Daily Session Note  Patient Details  Name: Russell Austin MRN: [id number] Date of Birth: [birth year]  Today's Date: 03/01/2015 SLP Individual Time: 0800-0830 SLP Individual Time Calculation (min): 30 min  Short Term Goals: Week 2: SLP Short Term Goal 1 (Week 2): Pt will demonstrate minimal overt s/s aspiration with trials of ice chips with mod I use of swallowing precautions.  SLP Short Term Goal 1 - Progress (Week 2): Updated due to goal met SLP Short Term Goal 2 (Week 2): Pt will perform pharyngeal strengthening exercises with mod I SLP Short Term Goal 3 (Week 2): Pt will consume dys 1 textures and nectar thick liquids with mod I use of swallowing precautions.    Skilled Therapeutic Interventions:  Pt was seen for skilled ST targeting dysphagia goals.  Upon arrival, pt was seated upright in wheelchair, awake, alert, and agreeable to participate in ST.  SLP facilitated the session with trials of nectar thick liquids via cup sips to continue working towards diet progression.  Pt utilized rate and portion control of cup sips with supervision cues and exhibited no overt s/s of aspiration during the abovementioned trials.  Recommend that pt be upgraded to nectar thick liquids via cup sips.  Furthermore, pt consumed dys 2 textures with supervision cues for use of swallowing precautions.  No overt s/s of aspiration were evident with solids.  As pt has presented with significant progress since initiation of PO diet; pt may benefit from trials of ice chips/water as part of the water protocol to continue working towards liquids advancement.  Will address at next available appointment.  Continue per current plan of care.    FIM:  Comprehension Comprehension Mode: Auditory Comprehension: 5-Understands complex 90% of the time/Cues < 10% of the time Expression Expression Mode: Verbal Expression: 5-Expresses complex 90% of the time/cues < 10% of the time Social  Interaction Social Interaction: 6-Interacts appropriately with others with medication or extra time (anti-anxiety, antidepressant). Problem Solving Problem Solving: 5-Solves complex 90% of the time/cues < 10% of the time Memory Memory: 6-More than reasonable amt of time FIM - Eating Eating Activity: 5: Supervision/cues  Pain Pain Assessment Pain Assessment: No/denies pain  Therapy/Group: Individual Therapy  Russell Austin, Selinda Orion 03/01/2015, 11:02 AM

## 2015-03-01 NOTE — Progress Notes (Signed)
Physical Therapy Weekly Progress Note  Patient Details  Name: Russell Austin MRN: [id number] Date of Birth: 12-08-1950  Beginning of progress report period: February 22, 2015 End of progress report period: March 01, 2015  Today's Date: 03/01/2015 PT Concurrent Time: 1030-1200 PT Concurrent Time Calculation (min): 90 min  Patient has met 2 of 10 long term goals.  Patient continuing to progress with therapy, however, based on right LE sensory deficit will educate caregivers in supervision level assist for ambulation and allow mod I wheelchair level as may only have intermittent assist at home.   Patient continues to demonstrate the following deficits: right LE sensory deficit, right LE weakness, right hemiparesis, decreased balance and therefore will continue to benefit from skilled PT intervention to enhance overall performance with activity tolerance, balance, ability to compensate for deficits and functional use of  right upper extremity and right lower extremity.  See Patient's Care Plan for progression toward long term goals.  Patient progressing toward long term goals with revisions.  Plan of care revisions: goals supervision with ambulation and mod I wheelchair.  Skilled Therapeutic Interventions/Progress Updates:    Patient seen for community outing.  Goals listed on outing sheet in shadow chart.  See shadow chart for goals listed.  Also patient negotiated [name] steps for entry/exit with min assist mod cues for technique stepping first in forward with left LE first, down with right LE first, then into van from parking lot at [employer] with reverse technique with walker onto first step, then with grabbar up second step to sit on seat.  Patient assisted back to room all needs within reach at end of session.  Therapy Documentation Precautions:  Precautions Precautions: Fall Precaution Comments: Dys 2 diet, nectar thick Restrictions Weight Bearing Restrictions: No Pain: Pain  Assessment Pain Assessment: No/denies pain Locomotion : Wheelchair Mobility Distance: 300   See FIM for current functional status  Therapy/Group: Concurrent treatment  Russell Austin,CYNDI 03/01/2015, 1:23 PM

## 2015-03-01 NOTE — Progress Notes (Addendum)
Occupational Therapy Session Note  Patient Details  Name: Russell Austin MRN: [id number] Date of Birth: 1951/06/01  Today's Date: 03/01/2015 OT Individual Time: 1430-1500 OT Individual Time Calculation (min): 30 min    Skilled Therapeutic Interventions/Progress Updates:    1:1 IADL/ Homemaking tasks and Dynamic standing balance activity. Focus on functional mobility with RW in kitchen setting while preparing muffins for light housekeeping activities in the kitchen . Pt required close supervision to steady A for dynamic standing balance and mod cuing for attention to left hand during tasks due to decr sensation. Pt demonstrated increased problem solving for tasks right hand could perform during a functional tasks as gross assist. Pt did need cuing for safety with turning RW and problem solving transporting items in the kitchen in the safest manner. Discussed home environment and safest ways to perform tasks for granddaughter (activity performed PTA) in the kitchen.   Therapy Documentation Precautions:  Precautions Precautions: Fall Precaution Comments: Dys 2 diet, nectar thick Restrictions Weight Bearing Restrictions: No Pain: Pain Assessment Pain Assessment: No/denies pain ADL: ADL ADL Comments: refer to FIM  See FIM for current functional status  Therapy/Group: Individual Therapy  Willeen Cass [facility] 03/01/2015, 3:40 PM

## 2015-03-01 NOTE — Progress Notes (Signed)
Occupational Therapy Session Note  Patient Details  Name: Russell Austin MRN: [id number] Date of Birth: 01/13/1951  Today's Date: 03/01/2015 OT Individual Time: 4037-0964 OT Individual Time Calculation (min): 75 min    Short Term Goals: Week 1:  OT Short Term Goal 1 (Week 1): Pt will be able to don LB clothing with min A. OT Short Term Goal 1 - Progress (Week 1): Met OT Short Term Goal 2 (Week 1): Pt will complete a toilet transfer with S. OT Short Term Goal 2 - Progress (Week 1): Met (using a squat pivot only with S) OT Short Term Goal 3 (Week 1): Pt will be able to bathe with S. OT Short Term Goal 3 - Progress (Week 1): Met (using long handled sponge) OT Short Term Goal 4 (Week 1): Pt will be able to use his R arm to wash his L arm with min A. OT Short Term Goal 4 - Progress (Week 1): Met OT Short Term Goal 5 (Week 1): Pt will be able to stand with steadying A as he washes his bottom in shower. OT Short Term Goal 5 - Progress (Week 1): Met Week 2:  OT Short Term Goal 1 (Week 2): Pt will transfer to toilet using RW with steadying A. OT Short Term Goal 2 (Week 2): Pt will don LB clothing with S. OT Short Term Goal 3 (Week 2): Pt will use R arm to wash L arm without A. OT Short Term Goal 4 (Week 2): Pt will be able to stand in the shower with S using grab bars for support.     Skilled Therapeutic Interventions/Progress Updates:    Pt seen this session for BADL retraining of shower, dressing using the RW as the AD versus the cane.  Focused on use of R arm,  Balance in standing and sit to stand without using RW. Pt ambulated to bathroom with steadying A with RW and sat on chair to doff clothing. Pt walked into shower with min A. Showered S, walked out min A to sit in arm chair in room.  Dressed with set up. Discussed with pt changing his bathroom transfers goals from mod I to S as he is using the walker and continues to need some support.  Pt in agreement. Good use of R hand to hold  deoderant and apply it under L arm.    Pt taken to gym to address RUE NMR with dynamic reaching R and L focusing on lower trunk control and hip elevation to increase his reaching distance. Grasping and releasing cones reaching forward focusing on neutral forearm position and forearm tends to pull into pronation. BUE AROM with hula hoop exercises focusing on tricep extension with shoulder AROM. Isolated thumb to finger prehension with each finger. Pt continues to need assist to actively touch thumb to ring and pinky finger. Pt is improving extremely well with RUE motor control. Pt transferred to wc to prepare to leave for community outing trip.   Therapy Documentation Precautions:  Precautions Precautions: Fall Precaution Comments: Dys 2 diet, nectar thick Restrictions Weight Bearing Restrictions: No     Pain: Pain Assessment Pain Assessment: No/denies pain ADL: ADL ADL Comments: refer to FIM     See FIM for current functional status  Therapy/Group: Individual Therapy  [place] 03/01/2015, 12:02 PM

## 2015-03-02 ENCOUNTER — Inpatient Hospital Stay (HOSPITAL_COMMUNITY): Payer: BC Managed Care – PPO

## 2015-03-02 ENCOUNTER — Inpatient Hospital Stay (HOSPITAL_COMMUNITY): Payer: BC Managed Care – PPO | Admitting: Speech Pathology

## 2015-03-02 LAB — GLUCOSE, CAPILLARY
GLUCOSE-CAPILLARY: 102 mg/dL — AB (ref 65–99)
Glucose-Capillary: 101 mg/dL — ABNORMAL HIGH (ref 65–99)
Glucose-Capillary: 144 mg/dL — ABNORMAL HIGH (ref 65–99)
Glucose-Capillary: 235 mg/dL — ABNORMAL HIGH (ref 65–99)

## 2015-03-02 NOTE — Progress Notes (Signed)
Social Work Patient ID: Russell Austin, male   DOB: 10-18-1950, 64 y.o.   MRN: [id number]   CSW met with pt and talked with his wife via telephone to update them on team conference discussion and to discuss pt's d/c.  Pt's wife and brother-in-law will come in for family education 03-03-15 to learn how they can help pt at home.  Pt's family can transport him to outpt therapy and family plans to be with him while his wife is at work.  CSW will order pt's DME, as well.  Pt is pleased to be going home tomorrow after therapies.  CSW will continue to follow and assist as needed.

## 2015-03-02 NOTE — Patient Care Conference (Signed)
Inpatient RehabilitationTeam Conference and Plan of Care Update Date: 03/02/2015   Time: 10:35 AM    Patient Name: Russell Austin      Medical Record Number: [id number]  Date of Birth: 04/01/1951 Sex: Male         Room/Bed: 4W05C/4W05C-01 Payor Info: Payor: BLUE Administrator, sports / Plan: [place] PPO / Product Type: *No Product type* /    Admitting Diagnosis: L CVA  Admit Date/Time:  02/14/2015  6:21 PM Admission Comments: No comment available   Primary Diagnosis:  Cerebral infarction due to thrombosis of basilar artery Principal Problem: Cerebral infarction due to thrombosis of basilar artery  Patient Active Problem List   Diagnosis Date Noted  . Dysphagia following cerebral infarction 02/15/2015  . Cerebral infarction due to thrombosis of basilar artery   . CKD stage 2 due to type 2 diabetes mellitus 02/12/2015  . Right hemiparesis   . CVA (cerebral infarction) 02/10/2015  . CKD (chronic kidney disease) 02/10/2015  . Type 2 diabetes mellitus with diabetic neuropathy 02/10/2015  . Neck pain   . Numbness and tingling 02/09/2015  . Back pain, acute 10/30/2012  . Hematuria of undiagnosed cause 10/30/2012  . Proteinuria 10/30/2012  . Left shoulder pain 07/17/2012  . Plantar fasciitis, bilateral 07/17/2012  . HEADACHE 01/07/2009  . Peripheral neuropathy 11/06/2007  . ERECTILE DYSFUNCTION, MILD 11/06/2007  . 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 Essential hypertension 07/08/2007  . EXTRINSIC ASTHMA, UNSPECIFIED 06/25/2007    Expected Discharge Date: Expected Discharge Date: 03/03/15  Team Members Present: Physician leading conference: Dr. Alysia Penna Social Worker Present: Alfonse Alpers, LCSW Nurse Present: Heather Roberts, RN PT Present: Georjean Mode, PT;Other (comment) Randa Ngo, PT) OT Present: Clyda Greener, Dorothyann Gibbs, OT SLP Present: Windell Moulding, SLP PPS Coordinator present : Daiva Nakayama, RN, CRRN    Current Status/Progress Goal Weekly Team Focus  Medical   RUE improving post botox, has increased ext tone in RLE  D/C to home family training  D/C planning , trial   Bowel/Bladder   Continent of bowel and bladder. LBM 03/01/15  Pt to remain continent of bowel and bladder  Monitor   Swallow/Nutrition/ Hydration   Dys 2, nectar thick liquids via cup sips; water protocol initiated 6/29  mod I   family education    ADL's   supervision with self care, min A to walk to bathroom with RW for ADL transfers  goals changed: mod I bathing, toileting, dressing; min A ambulation to toilet/shower, supervision simple housekeeping  ADL retraining, RUE NMR, dynamic balance, pt education   Mobility   supervision transfers, min A to supervision gait, mod I w/c  S ambulation (needs caregiver ed); Mod I w/c  gait, balance, strength, NMR right side, use of visual feedback due to sensory loss   Communication             Safety/Cognition/ Behavioral Observations  WFL   n/a  n/a   Pain   No c/o pain  <3  Assess for nonverbal cues of pain   Skin   CDI  CDI  Assess q shift    Rehab Goals Patient on target to meet rehab goals:  (after goals were downgraded) Rehab Goals Revised: some goals were downgraded *See Care Plan and progress notes for long and short-term goals.  Barriers to Discharge: Needs family training (pt overconfident per PT)    Possible Resolutions to Barriers:  fam tx prior to  d/c    Discharge Planning/Teaching Needs:  Pt plans to return home where his brother-in-law and other family members will be with him while his wife is at work.  Family to come for family education on 03-03-15, as pt will not be at a mod I level as he had hoped.   Team Discussion:  Pt's upper extremity tone is getting better post botox last week.  PT feels pt overestimates his abilities and this can make him unsafe.  OT feels his isolated movements are better and he can reach over his shoulder and this is a big  change for him.  Pt is on D2 diet with nectar thick liquids and   Revisions to Treatment Plan:  none   Continued Need for Acute Rehabilitation Level of Care: The patient requires daily medical management by a physician with specialized training in physical medicine and rehabilitation for the following conditions: Daily direction of a multidisciplinary physical rehabilitation program to ensure safe treatment while eliciting the highest outcome that is of practical value to the patient.: Yes Daily medical management of patient stability for increased activity during participation in an intensive rehabilitation regime.: Yes Daily analysis of laboratory values and/or radiology reports with any subsequent need for medication adjustment of medical intervention for : Neurological problems;Other  Alysia Scism, [name] 03/02/2015, 2:17 PM

## 2015-03-02 NOTE — Progress Notes (Signed)
Physical Therapy Session Note  Patient Details  Name: Russell Austin MRN: [id number] Date of Birth: 1950/12/15  Today's Date: 03/02/2015 PT Individual Time: [id number]; 1300-1420 PT Individual Time Calculation (min): 42 min , 80 min  Short Term Goals: Week 2:  PT Short Term Goal 1 (Week 2): = LTG of MOD I overall for D/C 03/01/15 PT Short Term Goal 1 - Progress (Week 2): Progressing toward goal  Skilled Therapeutic Interventions/Progress Updates:   tx 1:  Gait with toe cap on R shoe, over carpet with RW, min guard assist due to multiple mild LOB due to R toe catching.  Hypertonus R knee ext causes toe to catch, rather than inadequate DF.  Discussed with MD, who reported that Botox is not efficacious for extensor tone LE; he recommended BioNess through OPPT.  Gait up/down 12 steps 2 rails with close supervision , cues for not using R hand on rail.  Pt is distracted by his inability to keep his R hand on the rail functionally, and was safer leaving his RUe off rail.  Therapeutic activity: stand><sit in dining chair at table, with RW, x 3 trials, problem solving for body in space, location of RW, use of R hand during this transition.   W/c propulsion on unit modified independent.   Pt is discharging after therapies tomorrow; PT strongly recommended family ed with wife and/or family who will be with him during the day.   tx 2:  Pt's wife and brother in law will be coming in AM for family ed tomorrow.  Patient demonstrates increased fall risk as noted by score of  34 /56 on Berg Balance Scale.  (<36= high risk for falls, close to 100%; 37-45 significant >80%; 46-51 moderate >50%; 52-55 lower >25%). Falls risk discussed with pt. Therapeutic activities in squatting position: manipulation and matching of playing cards with L hand on low table in front of him, for sustained R knee flexion, min guard assist for frequent mild LOB. Toilet transfer in room with close supervision w/c>< standing at  toilet to urinate. Squat pivot w/c>< mat to L and R using L hand, safely with distant supervision.  Checked pt's sensation RLE: LT and proprioception R knee and ankle intact, however pt reports he feels like he is standing on a "sponge under my R foot", and pt appears to have poor sensation during functional activities.  neuromuscular re-education via manual cues, demo, VCs forced use for R trunk shortening in unsupported sitting, to facilitate trunk righting in sitting and standing.  Pt responded well with manual cues, but had difficulty recruiting quadratus lumborum muscle for hip hiking spontaneously. Head righting is delayed, and requires cueing.  W/c propulsion modified independent on unit and in room.    Therapy Documentation Precautions:  Precautions Precautions: Fall Precaution Comments: Dys 2 diet, nectar thick Restrictions Weight Bearing Restrictions: No Pain: none reported   Balance: Balance Balance Assessed: Yes Standardized Balance Assessment Standardized Balance Assessment: Berg Balance Test Berg Balance Test Sit to Stand: Able to stand without using hands and stabilize independently Standing Unsupported: Able to stand 2 minutes with supervision Sitting with Back Unsupported but Feet Supported on Floor or Stool: Able to sit safely and securely 2 minutes Stand to Sit: Sits safely with minimal use of hands Transfers: Able to transfer safely, definite need of hands Standing Unsupported with Eyes Closed: Able to stand 10 seconds with supervision Standing Ubsupported with Feet Together: Able to place feet together independently and stand for 1 minute with supervision  From Standing, Reach Forward with Outstretched Arm: Can reach forward >12 cm safely (5") (with LUE only) From Standing Position, Pick up Object from Floor: Able to pick up shoe, needs supervision From Standing Position, Turn to Look Behind Over each Shoulder: Needs supervision when turning Turn 360 Degrees:  Needs close supervision or verbal cueing Standing Unsupported, Alternately Place Feet on Step/Stool: Able to complete >2 steps/needs minimal assist Standing Unsupported, One Foot in Front: Loses balance while stepping or standing Standing on One Leg: Tries to lift leg/unable to hold 3 seconds but remains standing independently Total Score: 34 Dynamic Sitting Balance Dynamic Sitting - Balance Support: No upper extremity supported;Feet supported Dynamic Sitting - Level of Assistance: 6: Modified independent (Device/Increase time) Static Standing Balance Static Standing - Balance Support: No upper extremity supported Static Standing - Level of Assistance: 5: Stand by assistance Dynamic Standing Balance Dynamic Standing - Level of Assistance: 4: Min assist      See FIM for current functional status  Therapy/Group: Individual Therapy  Annaston Upham 03/02/2015, 4:22 PM

## 2015-03-02 NOTE — Progress Notes (Signed)
Occupational Therapy Session Note  Patient Details  Name: Russell Austin MRN: [id number] Date of Birth: 10-22-1950  Today's Date: 03/02/2015 OT Individual Time:  -       Short Term Goals: Week 2:  OT Short Term Goal 1 (Week 2): Pt will transfer to toilet using RW with steadying A. OT Short Term Goal 2 (Week 2): Pt will don LB clothing with S. OT Short Term Goal 3 (Week 2): Pt will use R arm to wash L arm without A. OT Short Term Goal 4 (Week 2): Pt will be able to stand in the shower with S using grab bars for support.  Skilled Therapeutic Interventions/Progress Updates:    Pt seen for 1:1 OT session with a focus on functional mobility, homemaking tasks, dynamic standing balance, RUE NMR, activtiy tolerance, and safety awareness. Pt received seated in w/c agreeable to therapy. Pt declined ADL session this AM preferring to complete balance and UE NMR exercises. Pt propelled w/c using hemi technique to ADL apt. Pt completed RUE dynamic reaching D1/D2 exercise in standing reaching for items in kitchen and crossing midline to place item on opposite side. Therapist applied kinesiotape on R fingers, wrist, and forearm for edema management. Pt requesting to work on balance and completed dynamic standing balance activity incorporating directional reach with RUE. Pt required min guard-SBA with 2 mild LOB noted. Then completed obstacle course with focus on turn changes and negotiation around sharp turns with min guard-SBA. Pt completed functional ambulation for apprx 15 min with one rest break. Therapist then provided gentle PROM to send session due to pt report of relief. Pt left seated in w/c with all needs within reach.   Therapy Documentation Precautions:  Precautions Precautions: Fall Precaution Comments: Dys 2 diet, nectar thick Restrictions Weight Bearing Restrictions: No General:   Vital Signs:   Pain: Pain Assessment Pain Assessment: No/denies pain ADL: ADL ADL Comments: refer  to FIM Exercises:   Other Treatments:    See FIM for current functional status  Therapy/Group: Individual Therapy  Dorann Ou 03/02/2015, 12:06 PM

## 2015-03-02 NOTE — Progress Notes (Signed)
Speech Language Pathology Daily Session Note  Patient Details  Name: Russell Austin MRN: [id number] Date of Birth: Jan 02, 1951  Today's Date: 03/02/2015 SLP Individual Time: 1027-2536 SLP Individual Time Calculation (min): 30 min  Short Term Goals: Week 2: SLP Short Term Goal 1 (Week 2): Pt will demonstrate minimal overt s/s aspiration with trials of ice chips with mod I use of swallowing precautions.  SLP Short Term Goal 1 - Progress (Week 2): Updated due to goal met SLP Short Term Goal 2 (Week 2): Pt will perform pharyngeal strengthening exercises with mod I SLP Short Term Goal 3 (Week 2): Pt will consume dys 1 textures and nectar thick liquids with mod I use of swallowing precautions.    Skilled Therapeutic Interventions:  Pt was seen for skilled ST targeting dysphagia goals.  Upon arrival, pt was seated upright in wheelchair, awake, alert, and agreeable to participate in [place].  SLP facilitated the session with trials of regular water following thorough oral care to continue working towards liquids advancement.  Pt consumed 6 oz of unthickened water with supervision instructional cues for slow rate and small bites/sips.  No overt s/s of aspiration were evident with the abovementioned trials.  Recommend initiating the water protocol to recondition swallow for toleration of thin liquids.  SLP provided extensive education regarding the water protocol including rationale behind regular water trials and steps needed to complete prior to trials for safe consumption of water.  SLP also provided pt with handouts of recommended textures and thickened liquids in anticipation of family education tomorrow prior to discharge.  Pt returned demonstration of how to thicken liquids to the appropriate viscosity with min assist.  Continue per current plan of care.    FIM:  Comprehension Comprehension Mode: Auditory Comprehension: 5-Understands complex 90% of the time/Cues < 10% of the  time Expression Expression Mode: Verbal Expression: 5-Expresses complex 90% of the time/cues < 10% of the time Social Interaction Social Interaction: 6-Interacts appropriately with others with medication or extra time (anti-anxiety, antidepressant). Problem Solving Problem Solving: 5-Solves complex 90% of the time/cues < 10% of the time Memory Memory: 6-More than reasonable amt of time FIM - Eating Eating Activity: 6: Swallowing techniques: self-managed  Pain Pain Assessment Pain Assessment: No/denies pain  Therapy/Group: Individual Therapy  Russell Austin, Russell Austin 03/02/2015, 10:51 AM

## 2015-03-02 NOTE — Progress Notes (Signed)
Nutrition Follow-up  DOCUMENTATION CODES:  Not applicable  INTERVENTION:  Encourage adequate PO intake.   NUTRITION DIAGNOSIS:  Increased nutrient needs related to  (therapy) as evidenced by estimated needs; ongoing  GOAL:  Patient will meet greater than or equal to 90% of their needs; met  MONITOR:  PO intake, Supplement acceptance, Weight trends, Labs, I & O's  REASON FOR ASSESSMENT:  Consult Calorie Count  ASSESSMENT: Pt with history of DM type 2 with retinopathy, HTN, glaucoma, who was admitted on 02/09/15 with reports of heaviness of RUE/RLE with intermittent numbness, pins and needles. 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   Pt has been advanced to a dysphagia 2 diet with nectar thick liquids. Meal completion has been 100%. Pt reports having a good appetite with no other difficulties. Pt was encouraged to eat his food at meal.  Labs and medications reviewed.   Height:  Ht Readings from Last 1 Encounters:  02/09/15 5' 9"  (1.753 m)    Weight:  Wt Readings from Last 1 Encounters:  03/02/15 172 lb 7.5 oz (78.23 kg)    Ideal Body Weight:  72.7 kg  Wt Readings from Last 10 Encounters:  03/02/15 172 lb 7.5 oz (78.23 kg)  02/14/15 155 lb 13.8 oz (70.7 kg)  12/20/14 182 lb (82.555 kg)  03/18/14 184 lb (83.462 kg)  12/24/13 184 lb (83.462 kg)  05/13/13 183 lb (83.008 kg)  10/30/12 182 lb (82.555 kg)  07/17/12 185 lb (83.915 kg)  03/26/12 185 lb (83.915 kg)  07/02/11 184 lb (83.462 kg)    BMI:  Body mass index is 25.46 kg/(m^2).  Estimated Nutritional Needs:  Kcal:  1900-2100  Protein:  95-105 grams  Fluid:  1.9 - 2.1 L/day  Skin:   (Non-pitting RUE, LE edema)  Diet Order:  DIET DYS 2 Room service appropriate?: Yes; Fluid consistency:: Nectar Thick  EDUCATION NEEDS:  No education needs identified at this time   Intake/Output Summary (Last 24 hours) at  03/02/15 1506 Last data filed at 03/02/15 1327  Gross per 24 hour  Intake    840 ml  Output   1050 ml  Net   -210 ml    Last BM:  6/28  Corrin Parker, MS, RD, LDN Pager # [phone] After hours/ weekend pager # [phone]

## 2015-03-03 ENCOUNTER — Ambulatory Visit (HOSPITAL_COMMUNITY): Payer: BC Managed Care – PPO

## 2015-03-03 ENCOUNTER — Inpatient Hospital Stay (HOSPITAL_COMMUNITY): Payer: BC Managed Care – PPO

## 2015-03-03 ENCOUNTER — Encounter (HOSPITAL_COMMUNITY): Payer: BC Managed Care – PPO | Admitting: Speech Pathology

## 2015-03-03 LAB — GLUCOSE, CAPILLARY: Glucose-Capillary: 75 mg/dL (ref 65–99)

## 2015-03-03 MED ORDER — ATORVASTATIN CALCIUM 10 MG PO TABS: 10.0000 mg | ORAL_TABLET | Freq: Every day | ORAL | Status: DC

## 2015-03-03 MED ORDER — TIZANIDINE HCL 2 MG PO TABS: 2.0000 mg | ORAL_TABLET | Freq: Three times a day (TID) | ORAL | Status: DC

## 2015-03-03 MED ORDER — HYDROCHLOROTHIAZIDE 25 MG PO TABS: 12.5000 mg | ORAL_TABLET | Freq: Every day | ORAL | Status: DC

## 2015-03-03 MED ORDER — LOSARTAN POTASSIUM 25 MG PO TABS: 25.0000 mg | ORAL_TABLET | Freq: Every day | ORAL | Status: DC

## 2015-03-03 MED ORDER — INSULIN GLARGINE 100 UNIT/ML ~~LOC~~ SOLN: 23.0000 [IU] | Freq: Every day | SUBCUTANEOUS | Status: DC

## 2015-03-03 MED ORDER — HYDROCHLOROTHIAZIDE 12.5 MG PO CAPS
12.5000 mg | ORAL_CAPSULE | Freq: Every day | ORAL | Status: DC
Start: 2015-03-03 — End: 2015-03-03

## 2015-03-03 MED ORDER — GLIPIZIDE 5 MG PO TABS: 5.0000 mg | ORAL_TABLET | Freq: Every day | ORAL | Status: DC

## 2015-03-03 NOTE — Progress Notes (Addendum)
Physical Therapy Discharge Summary  Patient Details  Name: Russell Austin MRN: [id number] Date of Birth: 1950-10-18  Today's Date: 03/03/2015 PT Individual Time: 0905-1005 PT Individual Time Calculation (min): 60 min   Patient has met 10 of 10 long term goals due to improved activity tolerance, improved balance, improved postural control, increased strength, ability to compensate for deficits, functional use of  right upper extremity and right lower extremity, improved attention, improved awareness and improved coordination.  Patient to discharge at an ambulatory level Supervision.   Patient's care partner is independent to provide the necessary cognitive assistance at discharge.  Reasons goals not met: n/a  Recommendation:  Patient will benefit from ongoing skilled PT services in outpatient setting to continue to advance safe functional mobility, address ongoing impairments in motor control, balance, coordination, activity tolerance, awareness, and minimize fall risk.  **Pt would benefit from BioNess electrical stim training for retraining R quads/hamstrings during gait. Pt has sufficient ankle DF to clear foot, but hypertonus interferes with clearance of foot at times during swing phase, especially on carpet. **  Equipment: R toe cap on shoe ; rental w/c with basic seat cushion to give pt independence PRN if family needs to be upstairs while he is downstairs, short-term Reasons for discharge: treatment goals met and discharge from hospital  Patient/family agrees with progress made and goals achieved: Yes  PT Discharge tx today: family ed with wife, brother and brother in law for gait with RW on level tile and carpet, up/down threshold/curb, floor transfer, simulated car transfer, HEP with hand out. They observed pt perform safe squat pivot transfers, modified independent,  w/c>< mat and sofa.  All family return- demonstrated safe guarding for transfers and locomotion. Pt without LOB  today, despite multiple turns L and R, backing up to sit, etc. Family observed gait up/down stairs but did not guard pt; he will stay on first floor of his house at d/c.   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; no change in vision L eye    Cognition Overall Cognitive Status: Within Functional Limits for tasks assessed Arousal/Alertness: Awake/alert Orientation Level: Oriented X4 Attention: Focused;Sustained;Selective;Alternating Focused Attention: Appears intact Sustained Attention: Appears intact Selective Attention: Appears intact Alternating Attention: Appears intact Memory: Appears intact Awareness: Appears intact Problem Solving: Appears intact Behaviors: Impulsive Safety/Judgment: Appears intact Sensation Sensation Light Touch: Appears Intact Proprioception: Appears Intact (poor coordination is apparently from hypertonus>reduced sensation) Additional Comments: pt reports that RLE feels tingly and like "foam is on the bottom of his foot" Coordination Coordination and Movement Description: decreased excursion, accuracy and range RLE Motor  Motor Motor: Hemiplegia;Abnormal tone Motor - Skilled Clinical Observations: right UE/LE hemiparesis with increased tone throughout Motor - Discharge Observations: RUE/RLE hemiparesis  Mobility Bed Mobility Bed Mobility:  (modified independent for all) Transfers Transfers: Yes (squat pivot , modified independent) Stand Pivot Transfers: 5: Supervision Locomotion  Ambulation Ambulation: Yes Ambulation/Gait Assistance: 5: Supervision Ambulation Distance (Feet): 150 Feet Assistive device: Rolling walker Ambulation/Gait Assistance Details: Verbal cues for precautions/safety  (wider BOS) Gait Gait: Yes Gait Pattern: Impaired Gait Pattern: Decreased hip/knee flexion - right;Step-through pattern;Narrow base of support;Poor foot clearance - right Gait velocity: reduced Stairs / Additional Locomotion Stairs: Yes Stairs Assistance: 5: Supervision Stairs Assistance Details: Verbal cues for precautions/safety Stair  Management Technique: Two rails Number of Stairs: 12 Height of Stairs: 7 (8 3" high; 4 7" high) Ramp: 5: Supervision Curb: 5: Psychiatric nurse: Yes Wheelchair Assistance: 6: Modified independent (Device/Increase time) Wheelchair Propulsion: Left upper extremity;Left lower extremity Wheelchair Parts Management: Independent Distance: 150  Trunk/Postural Assessment  Cervical Assessment Cervical Assessment: Within Functional Limits Thoracic Assessment Thoracic Assessment: Within Functional Limits Lumbar Assessment Lumbar Assessment: Within Functional Limits Postural Control Postural Control: Within Functional Limits Protective Responses: decreased protective responses due to limited RLE/RUE  Postural Limitations: improved pelvic position in sitting, with neutral pelvic tilt; poor recruitment of R trunk muscles for shortening/trunk righting responses  Balance Balance Balance Assessed: Yes Standardized Balance Assessment Standardized Balance Assessment: Merrilee Jansky Balance Test 03/02/15 Berg Balance Test Sit to Stand: Able to stand without using hands and stabilize independently Standing Unsupported: Able to stand 2 minutes with supervision Sitting with Back Unsupported but Feet Supported on Floor or Stool: Able to sit safely and securely 2 minutes Stand to Sit: Sits safely with minimal use of hands Transfers: Able to transfer safely, definite need of hands Standing Unsupported with Eyes Closed: Able to stand 10 seconds with supervision Standing Ubsupported with Feet Together: Able to place feet together independently  and stand for 1 minute with supervision From Standing, Reach Forward with Outstretched Arm: Can reach forward >12 cm safely (5") (with LUE only) From Standing Position, Pick up Object from Floor: Able to pick up shoe, needs supervision From Standing Position, Turn to Look Behind Over each Shoulder: Needs supervision when turning Turn 360 Degrees: Needs close supervision or verbal cueing Standing Unsupported, Alternately Place Feet on Step/Stool: Able to complete >2 steps/needs minimal assist Standing Unsupported, One Foot in Front: Loses balance while stepping or standing Standing on One Leg: Tries to lift leg/unable to hold 3 seconds but remains standing independently Total Score: 34 Dynamic Sitting Balance Dynamic Sitting - Balance Support: No upper extremity supported;Feet supported Dynamic Sitting - Level of Assistance: 6: Modified independent (Device/Increase time) Static Standing Balance Static Standing - Balance Support: No upper extremity supported Static Standing - Level of Assistance: 5: Stand by assistance Dynamic Standing Balance Dynamic Standing - Level of Assistance: 4: Min assist Extremity Assessment      RLE Assessment RLE Assessment: Exceptions to [facility] RLE Strength RLE Overall Strength: Deficits RLE Overall Strength Comments: grossly in sitting, hip flexion/adduction 4/5, hip abd 4-/5, knee ext 4+/5, knee flex 4/5, ankle DF 4/5 with some inversion LLE Assessment LLE Assessment: Within Functional Limits  See FIM for current functional status  Raysa Bosak 03/03/2015, 5:56 PM

## 2015-03-03 NOTE — Discharge Summary (Signed)
Physician Discharge Summary  Patient ID: Russell Austin MRN: [id number] DOB/AGE: Feb 15, 1951 64 y.o.  Admit date: 02/14/2015 Discharge date: 03/03/2015  Discharge Diagnoses:  Principal Problem:   Cerebral infarction due to thrombosis of basilar artery Active Problems:   Type 2 diabetes mellitus with diabetic neuropathy   CKD stage 2 due to type 2 diabetes mellitus   Right hemiparesis   Dysphagia following cerebral infarction   Discharged Condition: Stable   Labs:  Basic Metabolic Panel:  Recent Labs Lab 02/25/15 0518 02/28/15 0604  NA 139 141  K 4.4 4.0  CL 107 110  CO2 24 28  GLUCOSE 180* 104*  BUN 16 14  CREATININE 1.08 1.04  CALCIUM 8.3* 8.7*    CBC: CBC Latest Ref Rng 02/15/2015 02/11/2015 02/10/2015  WBC 4.0 - 10.5 K/uL 5.5 5.4 5.3  Hemoglobin 13.0 - 17.0 g/dL 11.9(L) 11.9(L) 12.0(L)  Hematocrit 39.0 - 52.0 % 36.2(L) 34.8(L) 35.4(L)  Platelets 150 - 400 K/uL 273 253 281     CBG:  Recent Labs Lab 03/02/15 0645 03/02/15 1219 03/02/15 1701 03/02/15 2110 03/03/15 0636  GLUCAP 102* 101* 144* 235* 75    Brief HPI:   Russell Austin is a 64 y.o. male with history of DM type 2 with retinopathy, HTN, glaucoma, who was admitted on 02/09/15 with reports of heaviness of RUE/RLE with intermittent numbness, pins and needles since waking that am. He underwent colonoscopy that am and after procedure was noted to have difficulty walking as well as difficulty swallowing while in ED. 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  MBS done revealing severe oro-pharyngeal dysphagia therefore was made NPO and NGT was placed for nutritional support. Dr Erlinda Hong evaluated patient and recommended ASA for left medullary infarct most likely due to small vessel disease.  On 06/11 am, patient had increase in right sided weakness due to evolutionary changes per MRI which Neurology felt was natural  progression. Patient was showing e is showing improvement in right sided weakness with antigravity strength on 06/13 am. Therapy ongoing and CIR was recommended by MD and Rehab team.    Hospital Course: Russell Austin was admitted to rehab 02/14/2015 for inpatient therapies to consist of PT, ST and OT at least three hours five days a week. Past admission physiatrist, therapy team and rehab RN have worked together to provide customized collaborative inpatient rehab.  Blood pressures have been monitored on bid basis and has been reasonably controlled. He developed acute renal insufficiency with hyperkalemia on 06/21 with rise in K+ to 6.6 and BUN/Cr to 31/1.45. This was treated with kayexalate and IVF with resolution. Prinivil was changed to cozaar and renal status has been stable on follow up. Tube feeds were continued past admission till dysphagia improved. Follow up swallow study was done on 6/21 showing improvement in swallow function. Diet has been upgraded to dysphagia 2, nectar liquids with strict aspiration precautions. He has been able to maintain adequate hydration on nectar liquids and has been advised to continue to push fluids past discharge.   Diabetes has been monitored with ac/hs checks and blood sugars have been well controlled over all.  He is to continue on Lantus insulin for now and follow up with PMD for further titration as needed. Spasticity has been managed with use of Zanaflex as well as botox injection of right pectoralis, biceps and brachioradialis muscles. He has had some improvement in right sided weakness as well as  improvement in coordination but continues to be limited by spasticity as well as sensory deficits. He has progressed to supervision level and will continue to receive follow up  Outpatient PT, OT and ST at [facility] past discharge.      Rehab course: During patient's stay in rehab weekly team conferences were held to monitor patient's progress, set goals and  discuss barriers to discharge. At admission, patient required moderate assistance with self care tasks and mobility. He was NPO and on tube feeds due to severe dysphagia.  He has had improvement in activity tolerance, balance, postural control, swallow function as well as ability to compensate for deficits. He has had improvement in functional use RUE  and RLE as well as improved awareness.he is able to complete ADL tasks with supervision. He is able to perform transfers independently and requires supervision to ambulate 150' with RW and supervision.  He is tolerating current diet with water protocol between meals. He is able to adhere/mantain aspiration precautions independently. Family education was done with wife and brother who will provide assistance as needed past discharge.    Disposition: 01-Home or Self Care  Diet: Dysphagia 2, nectar liquids.   Special Instructions: 1. Water protocol between meals 2. Check blood sugars ac/hs and follow up with PMD for further adjustment of medications.       Medication List    STOP taking these medications        acetaminophen 650 MG suppository  Commonly known as:  TYLENOL     feeding supplement (GLUCERNA 1.2 CAL) Liqd     insulin aspart 100 UNIT/ML injection  Commonly known as:  novoLOG     lisinopril 20 MG tablet  Commonly known as:  PRINIVIL,ZESTRIL     traMADol 50 MG tablet  Commonly known as:  ULTRAM      TAKE these medications        aspirin 325 MG tablet  Take 1 tablet (325 mg total) by mouth daily.     atorvastatin 10 MG tablet  Commonly known as:  LIPITOR  Place 1 tablet (10 mg total) into feeding tube daily at 6 PM.     glipiZIDE 5 MG tablet  Commonly known as:  GLUCOTROL  Take 1 tablet (5 mg total) by mouth daily before breakfast.     hydrochlorothiazide 25 MG tablet  Commonly known as:  HYDRODIURIL  Take 0.5 tablets (12.5 mg total) by mouth daily.     insulin glargine 100 UNIT/ML injection  Commonly known as:   LANTUS  Inject 0.23 mLs (23 Units total) into the skin at bedtime.     losartan 25 MG tablet  Commonly known as:  COZAAR  Take 1 tablet (25 mg total) by mouth daily.     tiZANidine 2 MG tablet  Commonly known as:  ZANAFLEX  Take 1 tablet (2 mg total) by mouth 3 (three) times daily.           Follow-up Information    Follow up with Charlett Blake, MD On 03/29/2015.   Specialty:  Physical Medicine and Rehabilitation   Why:  Be there at 10:30 for 10:45 am    appointment   Contact information:   [address] [place] [address] [phone]       Follow up with Antony Contras, MD. Call today.   Specialties:  Neurology, Radiology   Why:  for follow up appointment   Contact information:   [place]  [id number] [phone]       Follow up with TODD,JEFFREY Zenia Resides, MD On 03/17/2015.   Specialty:  Family Medicine   Why:  @ 3 PM with Dr. Dolores Hoose information:   [place] [phone]       Signed: Bary Leriche 03/03/2015, 12:05 PM

## 2015-03-03 NOTE — Progress Notes (Signed)
Speech Language Pathology Discharge Summary  Patient Details  Name: Russell Austin MRN: [id number] Date of Birth: 1950/09/20  Today's Date: 03/03/2015 SLP Individual Time: 1005-1030 SLP Individual Time Calculation (min): 25 min   Skilled Therapeutic Interventions:  Pt was seen for skilled ST targeting completion of family education prior to discharge.  Upon arrival, pt was seated upright in wheelchair, awake, alert, and agreeable to participate in [place].  Pt's wife and brother in law were present for the duration of today's therapy session.  SLP provided skilled education regarding dys 2 textures, including foods to avoid.  Handout was provided to maximize carryover in the home environment.  SLP also instructed pt's wife on how to thicken liquids to the appropriate viscosity.  Pt's wife had already reviewed handout provided to pt during yesterday's therapy session and returned demonstration of how to thicken water to nectar thick consistency with supervision cues.   SLP also provided skilled education regarding rationale for and parameters of the water protocol, including completion of thorough oral hygiene prior to trials of small amounts of unthickened water and waiting 30 minutes after meals.  SLP also instructed pt and pt's wife to stop trials of regular water should pt begin coughing, sound congested or if he develops a temperature.  Pt's wife verbalized understanding.  Pt and pt's wife also aware of recommendation that pt have an outpatient MBS prior to liquids advancement, though his solids may be advanced per clinical observations and outpatient SLP recommendations.      Patient has met 3 of 3 long term goals.  Patient to discharge at overall Modified Independent level.  Reasons goals not met: n/a   Clinical Impression/Discharge Summary:  Pt made functional gains while inpatient and is discharging having met 3 out of 3 long term goals.  Pt was initiated on a PO diet of dys 2 solids and  nectar thick liquids, which he is consuming with mod I use of swallowing precautions (slow rate, small bites/sips, alternating solids and liquids).  Pt is also consuming trials of regular water via cup sips following thorough oral care per the water protocol to continue working towards liquids advancement.  Pt demonstrates minimal overt s/s of aspiration with thin liquid trials.  Pt's solids will be able to be advanced per clinical observations; however, recommend that pt have an objective swallow study prior to liquids advancement due to significant pharyngeal weakness noted on previous MBS.  Give rapid progress made since initiation of PO diet, recommend that pt be scheduled for a repeat MBS soon after beginning outpatient ST.  Pt's cognition is grossly WFL.  Pt is discharging home with assistance from family.  Pt and family education is complete at this time.  SLP recommends ST follow up at next level of care to continue addressing dysphagia goals and diet liberalization.    Care Partner:  Caregiver Able to Provide Assistance: Yes  Type of Caregiver Assistance:  (swallowing )  Recommendation:  Outpatient SLP  Rationale for SLP Follow Up: Maximize swallowing safety   Equipment: thickener   Reasons for discharge: Discharged from hospital   Patient/Family Agrees with Progress Made and Goals Achieved: Yes   See FIM for current functional status  Emilio Math 03/03/2015, 3:50 PM

## 2015-03-03 NOTE — Progress Notes (Signed)
Social Work Discharge Note  The overall goal for the admission was met for:   Discharge location: Yes - to his home with his wife, brother, and brother-in-law to provide 24/7 support  Length of Stay: Yes  Discharge activity level: Yes - supervision  Home/community participation: Yes  Services provided included: MD, RD, PT, OT, SLP, RN, TR, Pharmacy, Neuropsych and SW  Financial Services: Private Insurance: [employer]  Follow-up services arranged: Outpatient: [facility], DME: 18x16 wheelchair with basic cushion; 3-in-1 commode; rolling walker and Patient/Family has no preference for HH/DME agencies  Comments (or additional information):  Pt to go to his home with wife, brother, and brother-in-law for 24/7 supervision.  Pt will go for outpt therapy and DME will be delivered to the home.  Pt was given a signed handicap placard application for family to take to the plate agency.  Pt will f/u with his primary care physician and neurologist and rehab MD.  No other needs identified.  Pt was pleased to be going home and family feels prepared to care for pt at home.  Patient/Family verbalized understanding of follow-up arrangements: Yes  Individual responsible for coordination of the follow-up plan: pt with his wife  Confirmed correct DME delivered: ,  [facility] 03/03/2015    ,  [facility] 

## 2015-03-03 NOTE — Progress Notes (Signed)
Pt discharged to home at 68 with wife. Discharge instructions given to pt and wife from [place], [place] with verbal understanding. RN educated wife on use of Lantus insulin pen with proper demonstration. Wife has no further questions

## 2015-03-03 NOTE — Progress Notes (Signed)
Occupational Therapy Discharge Summary  Patient Details  Name: Russell Austin MRN: [id number] Date of Birth: 1951-02-26  Today's Date: 03/03/2015 OT Individual Time: 0800-0900 OT Individual Time Calculation (min): 60 min    Patient has met 9 of 12 long term goals due to improved activity tolerance, improved balance, postural control, ability to compensate for deficits, functional use of  RIGHT upper and RIGHT lower extremity, improved awareness and improved coordination.  Patient to discharge at overall Supervision level.  Patient's care partner is independent to provide the necessary physical and cognitive assistance at discharge.  Therapist completed hands-on training in the areas of functional mobility, functional transfers, UE NMR, and overall safety. Pt's family demonstrated good carryover of knowledge and verbalized understanding of techniques to incorporate at home.   Reasons goals not met: Did not meet LTG goals involving LB dressing and bathing due to pt requiring supervision for safety.   Recommendation:  Patient will benefit from ongoing skilled OT services in outpatient setting to continue to advance functional skills in the area of Reduce care partner burden and safety, RUE NMR.  Equipment: BSC  Reasons for discharge: treatment goals met and discharge from hospital  Patient/family agrees with progress made and goals achieved: Yes   OT Intervention Pt seen for OT session with a focus on patient/caregiver education, ADL retraining, functional mobility, functional transfers, activity tolerance, and safety awareness. Pt declined shower this morning, preferring to complete dressing/functional transfer training. Pt completed UB dressing at supervision and LB with steadying A due to safety with standing balance. Pt completed functional ambulation apprx 150' to ADL aprt with min guard-SBA. Family participated in training with functional mobility. Pt experienced one LOB on carpeted  surface likely due to fatigue and change in surface. Pt then completed 4x shower transfer with walk-in shower simulation. Each family member completed transfer and demonstrated good safety awareness, body placement, and cues throughout task. Pt then engaged in seated RUE NMR activity. Pt demonstrated increased grasp strength using level 1-4 clothespins. Pt then transitioned back to room and left seated in w/c with family present and awaiting PT arrival.   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)  Cognition Overall Cognitive Status: Within Functional Limits for tasks assessed Sensation Sensation Light Touch: Appears Intact Stereognosis: Appears Intact Hot/Cold: Appears Intact Proprioception: Appears Intact Additional Comments: RUE has more intact sensation in comparison to RLE  Coordination Gross Motor Movements are Fluid and Coordinated: No Fine Motor Movements are Fluid and Coordinated: No Finger Nose Finger Test: slow delibrate movement with RUE ; able to isolate finger  Motor  Motor Motor: Hemiplegia Motor - Discharge Observations: RUE/RLE hemiparesis Mobility  Bed Mobility Supine to Sit: 5: Supervision Sit to Supine: 5: Supervision Transfers Transfers: Sit to Stand;Stand to Sit Sit to Stand: 5: Supervision Stand to Sit: 5: Supervision  Trunk/Postural Assessment  Cervical Assessment Cervical Assessment: Within Functional Limits Thoracic Assessment Thoracic Assessment: Within Functional Limits Lumbar Assessment Lumbar Assessment: Within Functional  Limits Postural Control Protective Responses: decreased protective responses due to limited RLE/RUE   Balance Balance Balance Assessed: Yes Dynamic Sitting Balance Dynamic Sitting - Balance Support: No upper  extremity supported;Feet supported Dynamic Sitting - Level of Assistance: 6: Modified independent (Device/Increase time) Static Standing Balance Static Standing - Balance Support: No upper extremity supported Static Standing - Level of Assistance: 5: Stand by assistance Dynamic Standing Balance Dynamic Standing - Level of Assistance: 4: Min assist;5: Stand by assistance Extremity/Trunk Assessment RUE Assessment RUE Assessment: Exceptions to [facility] (increased AROM in RUE; shoulder flexion WFL;  overall 3/5 MMT) LUE Assessment LUE Assessment: Within Functional Limits  See FIM for current functional status  Dorann Ou 03/03/2015, 9:24 AM

## 2015-03-03 NOTE — Plan of Care (Signed)
Problem: RH Bathing Goal: LTG Patient will bathe with assist, cues/equipment (OT) LTG: Patient will bathe specified number of body parts with assist with/without cues using equipment (position) (OT)  Outcome: Not Met (add Reason) Not met as patient requires safety due to safety.   Problem: RH Dressing Goal: LTG Patient will perform lower body dressing w/assist (OT) LTG: Patient will perform lower body dressing with assist, with/without cues in positioning using equipment (OT)  Outcome: Not Met (add Reason) Not met as patient requires safety due to safety.   Problem: RH Toileting Goal: LTG Patient will perform toileting w/assist, cues/equip (OT) LTG: Patient will perform toiletiing (clothes management/hygiene) with assist, with/without cues using equipment (OT)  Outcome: Not Met (add Reason) Not met as patient requires safety due to safety.

## 2015-03-03 NOTE — Discharge Instructions (Signed)
Inpatient Rehab Discharge Instructions  Russell Austin Discharge date and time:    Activities/Precautions/ Functional Status: Activity: activity as tolerated Diet: Diabetic diet--chopped foods with nectar liquids Wound Care: keep wound clean and dry Functional status:  ___ No restrictions     ___ Walk up steps independently ___ 24/7 supervision/assistance   ___ Walk up steps with assistance _X__ Intermittent supervision/assistance  ___ Bathe/dress independently ___ Walk with walker     ___ Bathe/dress with assistance ___ Walk Independently    ___ Shower independently ___ Walk with assistance    ___ Shower with assistance _X__ No alcohol     ___ Return to work/school ________   COMMUNITY REFERRALS UPON DISCHARGE:   Outpatient: PT     OT     ST      Agency:  [facility] Phone:  [phone]  Appointment Date/Time:  03-09-15 at 8:45 am; 9:30 am; and 10:15 am Medical Equipment/Items Ordered:  18x16 lightweight wheelchair with 18x16 basic cushion; rolling walker; 3-in-1  Agency/Supplier:  [facility]       Phone:  [phone]   GENERAL COMMUNITY RESOURCES FOR PATIENT/FAMILY: Support Groups:  [facility], [place] Stroke Support Group                              Meets the 3rd Sunday of every month (except June, July, and August) at [place] [facility] [address] [facility]   Special Instructions: 1.  Check blood sugars before meals and at bedtime.  2. Continue to take Sennokot S two pills at bedtime OR miralax daily for constipation--these are available over the counter.   STROKE/TIA DISCHARGE INSTRUCTIONS SMOKING Cigarette smoking nearly doubles your risk of having a stroke & is the single most alterable risk factor  If you smoke or have smoked in the last 12 months, you are advised to quit smoking for your health.  Most of the excess cardiovascular risk related to smoking disappears  within a year of stopping.  Ask you doctor about anti-smoking medications  [place] Quit Line: 1-800-QUIT NOW  Free Smoking Cessation Classes [phone]  CHOLESTEROL Know your levels; limit fat & cholesterol in your diet  Lipid Panel     Component Value Date/Time   CHOL 128 02/10/2015 0547   TRIG 93 02/10/2015 0547   HDL 33* 02/10/2015 0547   CHOLHDL 3.9 02/10/2015 0547   VLDL 19 02/10/2015 0547   LDLCALC 76 02/10/2015 0547      Many patients benefit from treatment even if their cholesterol is at goal.  Goal: Total Cholesterol (CHOL) less than 160  Goal:  Triglycerides (TRIG) less than 150  Goal:  HDL greater than 40  Goal:  LDL (LDLCALC) less than 100   BLOOD PRESSURE American Stroke Association blood pressure target is less that 120/80 mm/Hg  Your discharge blood pressure is:  BP: 133/74 mmHg  Monitor your blood pressure  Limit your salt and alcohol intake  Many individuals will require more than one medication for high blood pressure  DIABETES (A1c is a blood sugar average for last 3 months) Goal HGBA1c is under 7% (HBGA1c is blood sugar average for last 3 months)  Diabetes:     Lab Results  Component Value  Date   HGBA1C 7.5* 02/10/2015     Your HGBA1c can be lowered with medications, healthy diet, and exercise.  Check your blood sugar as directed by your physician  Call your physician if you experience unexplained or low blood sugars.  PHYSICAL ACTIVITY/REHABILITATION Goal is 30 minutes at least 4 days per week  Activity: No driving, Therapies: See above Return to work: N/A  Activity decreases your risk of heart attack and stroke and makes your heart stronger.  It helps control your weight and blood pressure; helps you relax and can improve your mood.  Participate in a regular exercise program.  Talk with your doctor about the best form of exercise for you (dancing, walking, swimming, cycling).  DIET/WEIGHT Goal is to maintain a healthy weight  Your  discharge diet is:  Dysphagia 2, nectar liquids Your height is:  5'9" Your current weight is: 172 lbs Your Body Mass Index (BMI) is:  25.6  Following the type of diet specifically designed for you will help prevent another stroke.  You are at goal weight. 168  Your goal Body Mass Index (BMI) is 19-24.  Healthy food habits can help reduce 3 risk factors for stroke:  High cholesterol, hypertension, and excess weight.  RESOURCES Stroke/Support Group:  Call [phone]   STROKE EDUCATION PROVIDED/REVIEWED AND GIVEN TO PATIENT Stroke warning signs and symptoms How to activate emergency medical system (call 911). Medications prescribed at discharge. Need for follow-up after discharge. Personal risk factors for stroke. Pneumonia vaccine given:  Flu vaccine given:  My questions have been answered, the writing is legible, and I understand these instructions.  I will adhere to these goals & educational materials that have been provided to me after my discharge from the hospital.      My questions have been answered and I understand these instructions. I will adhere to these goals and the provided educational materials after my discharge from the hospital.  Patient/Caregiver Signature _______________________________ Date __________  Clinician Signature _______________________________________ Date __________  Please bring this form and your medication list with you to all your follow-up doctor's appointments.

## 2015-03-09 ENCOUNTER — Ambulatory Visit: Payer: BC Managed Care – PPO

## 2015-03-09 ENCOUNTER — Ambulatory Visit
Payer: BC Managed Care – PPO | Attending: Physical Medicine & Rehabilitation | Admitting: Rehabilitative and Restorative Service Providers"

## 2015-03-09 ENCOUNTER — Telehealth: Payer: Self-pay | Admitting: Family Medicine

## 2015-03-09 ENCOUNTER — Ambulatory Visit: Payer: BC Managed Care – PPO | Admitting: Occupational Therapy

## 2015-03-09 ENCOUNTER — Other Ambulatory Visit: Payer: Self-pay | Admitting: *Deleted

## 2015-03-09 VITALS — BP 152/67 | HR 64

## 2015-03-09 VITALS — BP 168/98

## 2015-03-09 VITALS — BP 146/68 | HR 63

## 2015-03-09 DIAGNOSIS — I6980 Unspecified sequelae of other cerebrovascular disease: Secondary | ICD-10-CM | POA: Diagnosis present

## 2015-03-09 DIAGNOSIS — R269 Unspecified abnormalities of gait and mobility: Secondary | ICD-10-CM

## 2015-03-09 DIAGNOSIS — I6931 Cognitive deficits following cerebral infarction: Secondary | ICD-10-CM | POA: Diagnosis present

## 2015-03-09 DIAGNOSIS — I69319 Unspecified symptoms and signs involving cognitive functions following cerebral infarction: Secondary | ICD-10-CM

## 2015-03-09 DIAGNOSIS — R609 Edema, unspecified: Secondary | ICD-10-CM | POA: Insufficient documentation

## 2015-03-09 DIAGNOSIS — M6281 Muscle weakness (generalized): Secondary | ICD-10-CM | POA: Diagnosis present

## 2015-03-09 DIAGNOSIS — IMO0002 Reserved for concepts with insufficient information to code with codable children: Secondary | ICD-10-CM

## 2015-03-09 DIAGNOSIS — R279 Unspecified lack of coordination: Secondary | ICD-10-CM | POA: Insufficient documentation

## 2015-03-09 DIAGNOSIS — R1313 Dysphagia, pharyngeal phase: Secondary | ICD-10-CM | POA: Insufficient documentation

## 2015-03-09 DIAGNOSIS — R41841 Cognitive communication deficit: Secondary | ICD-10-CM | POA: Insufficient documentation

## 2015-03-09 DIAGNOSIS — I69859 Hemiplegia and hemiparesis following other cerebrovascular disease affecting unspecified side: Secondary | ICD-10-CM | POA: Diagnosis present

## 2015-03-09 DIAGNOSIS — I69359 Hemiplegia and hemiparesis following cerebral infarction affecting unspecified side: Secondary | ICD-10-CM

## 2015-03-09 MED ORDER — LOSARTAN POTASSIUM 25 MG PO TABS: 25.0000 mg | ORAL_TABLET | Freq: Every day | ORAL | Status: DC

## 2015-03-09 NOTE — Telephone Encounter (Signed)
Spoke to patient and Losartan was sent to [facility] on [address]. Reminded patient of appointment to see MD K next Thursday, 14th, at 8:15 am.

## 2015-03-09 NOTE — Patient Instructions (Addendum)
   SWALLOW SAFELY DURING MEALS BY:  - Alternating between a bite and a sip  - Take smaller bites and sips  - Take 2-3 swallows for each bite or sip  - Clear throat and swallow every once in a while   We will begin your exercises for swallowing next time, as your blood pressure remains too high to do them today.

## 2015-03-09 NOTE — Therapy (Signed)
[facility] [address] [place], [place], [address] Phone: [phone]   Fax:  [phone]  Speech Language Pathology Evaluation  Patient Details  Name: Russell Austin MRN: [id number] Date of Birth: [birth year] Referring Provider:  Dorena Cookey, MD  Encounter Date: 03/09/2015      End of Session - 03/09/15 1346    Visit Number 1   Number of Visits 16   Date for SLP Re-Evaluation 05/10/15   SLP Start Time 0849   SLP Stop Time  0932   SLP Time Calculation (min) 43 min   Activity Tolerance Treatment limited secondary to medical complications (Comment)  elevated BP      Past Medical History  Diagnosis Date  . Diabetes mellitus type II   . Hypertension   . ED (erectile dysfunction)   . Diabetic retinopathy FOLLOWED BY DR Zadie Rhine  . 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 Glaucoma of both eyes   . Left hydrocele     Past Surgical History  Procedure Laterality Date  . Undescended right testicle removed  1994  . Right eye vitrectomy/ insertion glaucoma seton/ laser repair  12-13-2008    RETINAL ARTERY OCCLUSION /NEOVASCULAR GLAUCOMA/ HEMORRHAGE  . Right eye vitretomy/ insertion glaucoma seton x2/ laser  03-24-2009    RECURRENT HEMORRHAGE/ OCCLUSION INTERNAL SETON  . Left eye laser retina repair  SEPT 2012  . Appendectomy  AGE EARLY 20'S  . Hydrocele excision  03/31/2012    Procedure: HYDROCELECTOMY ADULT;  Surgeon: Franchot Gallo, MD;  Location: [facility];  Service: Urology;  Laterality: Left;  45 MINS      Filed Vitals:   03/09/15 0954  BP: 152/67  Pulse: 64    Visit Diagnosis: Pharyngeal dysphagia  Cognitive communication deficit      Subjective Assessment - 03/09/15 1020    Subjective Pt reports a cramp on his rt side (leg and arm) this morning. "I don't think I have any issues with my swallowing"            SLP Evaluation [facility] - 03/09/15 0955     SLP Visit Information   SLP Received On 03/09/15   Onset Date 02-09-15   Medical Diagnosis CVA   Pain Assessment   Currently in Pain? No/denies  "felt like a cramp all the way down my rt side" this morning   General Information   Other Pertinent Information Pt d/c'd on dys 2, nectar-thick diet. Pt is eating what seems to be described as Dys III diet with nectar liquids. No overt s/s aspiration PNA to date and none observed today. SLP to request modified barium swallow exam (MBSS) for week of 03-14-15, per CIR SLP recommendations.   Prior Functional Status   Cognitive/Linguistic Baseline --  ID'd as grossly functional up on d/c from [place] On disability  "I'm the housewife"   Cognition   Overall Cognitive Status --  to be assessed later due to time constraints   Auditory Comprehension   Overall Auditory Comprehension Appears within functional limits for tasks assessed   Verbal Expression   Overall Verbal Expression Appears within functional limits for tasks assessed   Oral Motor/Sensory Function   Labial ROM Within Functional Limits   Lingual ROM --  reduced with rapid nonspeaking movements   Lingual Strength Reduced Right  slight   Lingual Coordination Reduced   Motor Speech   Overall Motor Speech Appears within functional limits for tasks assessed  Pt provided 1/4 swallowing strategies suggested by SLP on CIR. He vaguely told SLP guidelines to follow with water protocol. SLP was req'd to provide strategies for POs as well as the guidelines for water protocol. Pt appeared to recall guidelines once SLP told them to pt.  Pt does not appear to be adhering to Dys II diet as wife stated, "whatever we eat, he eats," but that she was cutting solids up into smaller bite size pieces for pt. No overt s/s aspiration PNA reported by pt/wife today, nor were any observed by SLP. SLP has suggested follow up modified barium swallow exam to referring MD for next week. SLP reiterated to  pt's wife to refer to diet handout CIR SLP provided at discharge to see what solids pt will likely be safe/unsafe with.  Pt reported to SLP 2 of his swallow exercises and then stated (upon SLP question) that he did not have any handout with dysphagia exercises. SLP told pt rationale for swallowing exercises he recalled.  SLP also educated pt/wife regarding possible ramifications of reduced lingual coordination (i.e., dysphagia) during oral motor testing, as pt stated he did not have any swallowing problems at this time.         SLP Education - 03/09/15 1345    Education provided Yes   Education Details Swallow precautions, diet recommendations from CIR, s/s aspiration PNA.   Person(s) Educated Patient;Spouse   Methods Explanation;Demonstration;Verbal cues   Comprehension Verbalized understanding;Returned demonstration;Verbal cues required;Need further instruction          SLP Short Term Goals - 03/09/15 1352    SLP [place] #1   Title pt will complete swallowing HEP with occasional min A   Time 4   Period Weeks   Status New   SLP SHORT TERM GOAL #2   Title pt will demo awareness of safe swallow techniques by using them with POs with rare min A over three sessions   Time 4   Period Weeks   Status New   SLP SHORT TERM GOAL #3   Title pt will tell SLP three s/s aspiration PNA   Time 4   Period Weeks   Status New          SLP Long Term Goals - 03/09/15 1354    SLP LONG TERM GOAL #1   Title pt will complete HEP with modified independence   Time 8   Period Weeks   Status New   SLP LONG TERM GOAL #2   Title pt will demo understanding of consequences of not following recommendations with water protocol   Time 8   Period Weeks   Status New   SLP LONG TERM GOAL #3   Title pt will demo safe swallow strategies with POs, during ST session, over 4 total visits   Time 8   Period Weeks   Status New          Plan - 03/09/15 1349    Clinical Impression Statement  Pt presents to [place] today with elevated BP, limiting pt participation in swallowing HEP development. Pt requires skilled ST to develop swallowing HEP, monitor PO safety, and success with procedural completion of HEP. Pt may well have cognitive-communication deficits as well (awareness?), however these were denied by wife and pt. Pt does not think he has swallowing issues.   Speech Therapy Frequency 2x / week   Duration --  8 weeks   Treatment/Interventions Aspiration precaution training;Pharyngeal strengthening exercises;Oral motor exercises;Diet toleration management by SLP;Trials  of upgraded texture/liquids;SLP instruction and feedback   Potential to Achieve Goals Good        Problem List Patient Active Problem List   Diagnosis Date Noted  . Dysphagia following cerebral infarction 02/15/2015  . Cerebral infarction due to thrombosis of basilar artery   . CKD stage 2 due to type 2 diabetes mellitus 02/12/2015  . Right hemiparesis   . CVA (cerebral infarction) 02/10/2015  . CKD (chronic kidney disease) 02/10/2015  . Type 2 diabetes mellitus with diabetic neuropathy 02/10/2015  . Neck pain   . Numbness and tingling 02/09/2015  . Back pain, acute 10/30/2012  . Hematuria of undiagnosed cause 10/30/2012  . Proteinuria 10/30/2012  . Left shoulder pain 07/17/2012  . Plantar fasciitis, bilateral 07/17/2012  . HEADACHE 01/07/2009  . Peripheral neuropathy 11/06/2007  . ERECTILE DYSFUNCTION, MILD 11/06/2007  . 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 Essential hypertension 07/08/2007  . EXTRINSIC ASTHMA, UNSPECIFIED 06/25/2007    [facility] , MS, CCC-SLP  03/09/2015, 1:57 PM  [place] [address] [place], [place], [address] Phone: [phone]   Fax:  [phone]

## 2015-03-09 NOTE — Telephone Encounter (Signed)
Russell Austin called for Russell Austin was in physical therapy today and they said that his blood pressure was high today and it was asymptomatic. He has been without his medicine for a few days because he hasn't received them in the mail. I talked to [place] and she said that we could send in at least a week prescription to [facility] til he gets his medication in the mail. He also has a post hospital follow up next Thursday.

## 2015-03-09 NOTE — Therapy (Signed)
[facility] [address] [place], [place], [address] Phone: [phone]   Fax:  [phone]  Physical Therapy Evaluation  Patient Details  Name: Russell Austin MRN: [id number] Date of Birth: [birth year] Referring Provider:  Charlett Blake, MD  Encounter Date: 03/09/2015      PT End of Session - 03/09/15 1008    Visit Number 1   Number of Visits 16   Date for PT Re-Evaluation 05/08/15   Authorization Type check benefits with insurance specialist to clarify*   PT Start Time 0845   PT Stop Time 0920   PT Time Calculation (min) 35 min   Equipment Utilized During Treatment Gait belt   Activity Tolerance Treatment limited secondary to medical complications (Comment)  elevated BP   Behavior During Therapy [facility] for tasks assessed/performed      Past Medical History  Diagnosis Date  . Diabetes mellitus type II   . Hypertension   . ED (erectile dysfunction)   . Diabetic retinopathy FOLLOWED BY DR Zadie Rhine  . 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 Glaucoma of both eyes   . Left hydrocele     Past Surgical History  Procedure Laterality Date  . Undescended right testicle removed  1994  . Right eye vitrectomy/ insertion glaucoma seton/ laser repair  12-13-2008    RETINAL ARTERY OCCLUSION /NEOVASCULAR GLAUCOMA/ HEMORRHAGE  . Right eye vitretomy/ insertion glaucoma seton x2/ laser  03-24-2009    RECURRENT HEMORRHAGE/ OCCLUSION INTERNAL SETON  . Left eye laser retina repair  SEPT 2012  . Appendectomy  AGE EARLY 20'S  . Hydrocele excision  03/31/2012    Procedure: HYDROCELECTOMY ADULT;  Surgeon: Franchot Gallo, MD;  Location: [facility];  Service: Urology;  Laterality: Left;  45 MINS      Filed Vitals:   03/09/15 0907  BP: 168/98  End of session=170/90  Visit Diagnosis:  Abnormality of gait  Generalized muscle weakness      Subjective Assessment - 03/09/15 0851     Subjective The patient is s/p cerebral infarction on 02/09/2015 admitted to [facility] and transferred to [place] rehab.  Patient d/c home on 03/03/2015.  He has 24 hour supervision.     Patient is accompained by: Family member   Pertinent History diabetes with blindness R eye    Patient Stated Goals Be able to move freely (walk, pick up things, drive); regain independence   Currently in Pain? No/denies  tightness in morning            [facility] PT Assessment - 03/09/15 0854    Assessment   Medical Diagnosis cerebral infarction   Onset Date/Surgical Date --  02/09/2015   Hand Dominance Left   Prior Therapy --  acute and IP rehab   Precautions   Precautions Fall  24 hour supervision, no driving   Balance Screen   Has the patient fallen in the past 6 months No   Has the patient had a decrease in activity level because of a fear of falling?  Yes   Is the patient reluctant to leave their home because of a fear of falling?  --  *overall mobility impaired from [place] residence   Living Arrangements Spouse/significant other  61 yo granddaughter   Home Access Stairs to enter   Entrance Stairs-Number of Steps --  2   Entrance Stairs-Rails None   Home Layout Two level  Alternate Level Stairs-Number of Steps --  12+   Alternate Level Stairs-Rails Left  when standing at bottom of steps   Halfway - 2 wheels;Bedside commode;Wheelchair - manual;Shower seat   Prior Function   Level of Independence Independent  very active,    Sensation   Light Touch --  tingling R side   Tone   Assessment Location --  patient describes spasms, worse this morning    ROM / Strength   AROM / PROM / Strength AROM;Strength   AROM   Overall AROM  Within functional limits for tasks performed   Strength   Overall Strength Deficits   Overall Strength Comments L LE 5/5 R LE 3/5 hip flexion, 5/5, knee ext, 4/5 knee flex, 3/5 ankle DF   Transfers    Transfers Sit to Stand;Stand Pivot Transfers   Ambulation/Gait   Ambulation/Gait No   Gait Comments Deferred gait assessment due to elevated BP   Standardized Balance Assessment   Standardized Balance Assessment Berg Balance Test   Berg Balance Test   Sit to Stand Able to stand  independently using hands   Standing Unsupported Able to stand 2 minutes with supervision   Sitting with Back Unsupported but Feet Supported on Floor or Stool Able to sit safely and securely 2 minutes   Stand to Sit Sits safely with minimal use of hands   Transfers Able to transfer safely, definite need of hands   Standing Unsupported with Eyes Closed Able to stand 10 seconds with supervision   Standing Ubsupported with Feet Together Needs help to attain position but able to stand for 30 seconds with feet together   From Standing, Reach Forward with Outstretched Arm Can reach forward >5 cm safely (2")   From Standing Position, Pick up Object from Floor Able to pick up shoe, needs supervision   From Standing Position, Turn to Look Behind Over each Shoulder Needs supervision when turning   Turn 360 Degrees Needs assistance while turning   Standing Unsupported, Alternately Place Feet on Step/Stool Needs assistance to keep from falling or unable to try   Standing Unsupported, One Foot in Front Able to take small step independently and hold 30 seconds   Standing on One Leg Tries to lift leg/unable to hold 3 seconds but remains standing independently   Total Score 30/56 *high fall risk   High Level Balance   High Level Balance Comments --  Blood pressure after Berg=182/92   RLE Tone   RLE Tone Comments --  "I felt paralyzed" this morning with spasms           PT Education - 03/09/15 1007    Education provided Yes   Education Details CVA warning signs, recommended spouse call primary MD due to elevated BP   Person(s) Educated Patient;Spouse   Methods Explanation   Comprehension Verbalized understanding           PT Short Term Goals - 03/09/15 1010    PT SHORT TERM GOAL #1   Title The patient will verbalize understanding of stroke risk factors and warning signs.   Baseline Target date 04/08/2015   Time 4   Period Weeks   PT SHORT TERM GOAL #2   Title The patient will improve Berg score from 30/56 up to 38/56 to demo decreased risk for falls.   Baseline Target date 04/08/2015   Time 4   Period Weeks   PT SHORT TERM GOAL #3   Title The patient will have gait  further assessed and goal to follow (held today due to elvated BP).   Baseline Target date 04/08/2015   Time 4   Period Weeks   PT SHORT TERM GOAL #4   Title The patient will be able to negotiate his steps at home without sitting down to descend steps per report.   Baseline Target date 04/08/2015   Time 4   Period Weeks   PT SHORT TERM GOAL #5   Title The patient will transfer sit<>stand and stand pivot modified indep.   Baseline Target date 04/08/2015   Time 4   Period Weeks           PT Long Term Goals - 03/09/15 1022    PT LONG TERM GOAL #1   Title The patient will be indep with HEP.   Baseline Target date 05/08/2015   Time 8   Period Weeks   PT LONG TERM GOAL #2   Title The patient will improve Berg from 30/56 up to > or equal to 42/56.   Baseline Target date 05/08/2015   Time 8   Period Weeks   PT LONG TERM GOAL #3   Title Add LTG for gait once BP stable and can assess (gait speed and level of assistance).   Baseline Target date 05/08/2015   Time 8   Period Weeks   PT LONG TERM GOAL #4   Title The patient will be further assessed on stairs and goal to follow, as indicated.   Baseline Target date 05/08/2015   Time 8   Period Weeks   PT LONG TERM GOAL #5   Title Improve SIS Mobility by 20% (baseline 41.7%).   Baseline Target date 05/08/2015   Time 8   Period Weeks             Plan - 03/09/15 1027    Clinical Impression Statement The patient is a 64 yo male s/p CVA 02/09/2015 presenting with R hemiparesis.  He  arrives today with elevated BP that limited his activities during evaluation.  PT to further assess stairs, endurance and gait as patient able to tolerate from a medical standpoint.   Pt will benefit from skilled therapeutic intervention in order to improve on the following deficits Abnormal gait;Decreased activity tolerance;Decreased balance;Difficulty walking;Postural dysfunction;Impaired sensation;Decreased mobility;Decreased strength   Rehab Potential Good   Clinical Impairments Affecting Rehab Potential HTN   PT Frequency 2x / week   PT Duration 8 weeks   PT Treatment/Interventions ADLs/Self Care Home Management;Gait training;Neuromuscular re-education;Balance training;Therapeutic exercise;Therapeutic activities;Functional mobility training;Stair training;Patient/family education;Manual techniques   PT Next Visit Plan Check BP; assess gait (71M and level of assist to write goals); assess stairs; educate in HEP   PT Home Exercise Plan LE strengthening, stretching, general mobility   Recommended Other Services Recommended f/u with primary MD office today by phone for BP meds   Consulted and Agree with Plan of Care Patient         Problem List Patient Active Problem List   Diagnosis Date Noted  . Dysphagia following cerebral infarction 02/15/2015  . Cerebral infarction due to thrombosis of basilar artery   . CKD stage 2 due to type 2 diabetes mellitus 02/12/2015  . Right hemiparesis   . CVA (cerebral infarction) 02/10/2015  . CKD (chronic kidney disease) 02/10/2015  . Type 2 diabetes mellitus with diabetic neuropathy 02/10/2015  . Neck pain   . Numbness and tingling 02/09/2015  . Back pain, acute 10/30/2012  . Hematuria of undiagnosed cause 10/30/2012  .  Proteinuria 10/30/2012  . Left shoulder pain 07/17/2012  . Plantar fasciitis, bilateral 07/17/2012  . HEADACHE 01/07/2009  . Peripheral neuropathy 11/06/2007  . ERECTILE DYSFUNCTION, MILD 11/06/2007  . Blind right eye  10/09/2007  . 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 Essential hypertension 07/08/2007  . EXTRINSIC ASTHMA, UNSPECIFIED 06/25/2007   Thank you for the referral of this patient.   [place], [place] 03/09/2015, 10:31 AM  [facility] [address] [place], [place], [address] Phone: [phone]   Fax:  [phone]

## 2015-03-10 NOTE — Therapy (Signed)
[facility] [address] [place], [place], [address] Phone: [phone]   Fax:  [phone]  Occupational Therapy Evaluation  Patient Details  Name: Russell Austin MRN: [id number] Date of Birth: 11/15/1950 Referring Provider:  Charlett Blake, MD  Encounter Date: 03/09/2015      OT End of Session - 03/10/15 1023    Visit Number 1   Number of Visits 17   Date for OT Re-Evaluation 05/10/15   Authorization Type BCBS PPO   OT Start Time [id number]   OT Stop Time 1100   OT Time Calculation (min) 45 min   Activity Tolerance Patient tolerated treatment well      Past Medical History  Diagnosis Date  . Diabetes mellitus type II   . Hypertension   . ED (erectile dysfunction)   . Diabetic retinopathy FOLLOWED BY DR Zadie Rhine  . 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 Glaucoma of both eyes   . Left hydrocele     Past Surgical History  Procedure Laterality Date  . Undescended right testicle removed  1994  . Right eye vitrectomy/ insertion glaucoma seton/ laser repair  12-13-2008    RETINAL ARTERY OCCLUSION /NEOVASCULAR GLAUCOMA/ HEMORRHAGE  . Right eye vitretomy/ insertion glaucoma seton x2/ laser  03-24-2009    RECURRENT HEMORRHAGE/ OCCLUSION INTERNAL SETON  . Left eye laser retina repair  SEPT 2012  . Appendectomy  AGE EARLY 20'S  . Hydrocele excision  03/31/2012    Procedure: HYDROCELECTOMY ADULT;  Surgeon: Franchot Gallo, MD;  Location: [facility];  Service: Urology;  Laterality: Left;  45 MINS      Filed Vitals:   03/09/15 1009  BP: 146/68  Pulse: 63    Visit Diagnosis:  Lack of coordination due to stroke - Plan: Ot plan of care cert/re-cert  Hemiparesis affecting nondominant side as late effect of cerebrovascular accident - Plan: Ot plan of care cert/re-cert  Generalized muscle weakness - Plan: Ot plan of care cert/re-cert  Edema - Plan: Ot plan of care  cert/re-cert  Cognitive deficits following cerebral infarction - Plan: Ot plan of care cert/re-cert      Subjective Assessment - 03/09/15 1021    Patient is accompained by: Family member  SPOUSE   Pertinent History cerebral infarct 02/09/15, diabetic retinopathy with Rt eye legally blind, DM   Patient Stated Goals to return to cooking, cleaning and use of RT arm   Currently in Pain? 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 Blind Rt eye (premorbid)   Prior Therapy Pt on CIR 02/14/15 - 03/03/15   Precautions   Precautions Fall  no driving, 24 hr. supervision   Balance Screen   Has the patient fallen in the past 6 months No   Home  Environment   Additional Comments Pt has shower seat and walk in shower   Lives With Spouse  in 2 level home, 2 STE and 25 y.o. granddaughter   Prior Function   Level of Independence Independent   Vocation On disability  could drive prior to CVA   Vocation Requirements N/A   ADL   ADL comments Pt mod I for BADLS and using potty chair in  walk in shower. Dependent for most IADLS but did most cooking/cleaning prior to CVA.    Mobility   Mobility Status --  using walker in house, w/c in community   Written Expression   Dominant Hand Left   Handwriting 90% legible   Vision - History   Baseline Vision Legally blind  Rt eye (premorbidly)   Visual History Retinopathy   Additional Comments legally blind RT eye due to diabetic retionpathy. Pt reports no changes in vision from CVA   Cognition   Overall Cognitive Status Cognition to be further assessed in functional context PRN  pt denies changes from CVA   Awareness --  Speech therapist reports decreased awareness   Observation/Other Assessments   Other Surveys  Select   Stroke  Impact Scale  Hand function: 0%   Outcome Measures --   Sensation   Light Touch Appears Intact   Hot/Cold --  reports intact   Additional Comments mildly impaired with 2 pt. discrimination   Coordination   9 Hole Peg Test Right   Right 9 Hole Peg Test unable (pt can pick up peg with max difficulty and drops, but unable to manipulate for placement)   Box and Blocks Rt = 15   Coordination Pt has gross finger flexion 90% and gross finger ext WFL's, but no in hand manipulation   Edema   Edema moderate Rt hand   ROM / Strength   AROM / PROM / Strength AROM   AROM   Overall AROM Comments Rt shoulder flexion and abduction approx. 110-120* with compensations (IR and forearm pronation). ER/IR approx. 75%. Elbow flex/ext WFL's, full pronation, 50% supination. Gross finger flexion 90%, extension WFL's. Thumb opposition to 3rd digit.    Hand Function   Right Hand Grip (lbs) 22   Left Hand Grip (lbs) 68                  OT Treatments/Exercises (OP) - 03/10/15 0001    ADLs   ADL Comments Reviewed CVA risk factors and warning signs (secondary to HTN during P.T. session and currently w/o BP meds. P.T. sent message to MD re: this). Pt/wife verbalized understanding. Also, discussed edema mngmt techniques for Rt hand including: elevation, retrograde masaage, exercises for hand, and compression glove. Pt issued compression glove and instructed in wear and care. Also clarified proper positioning for self ROM in shoulder flexion. Pt/wife both verbalized understanding               OT Education - 03/09/15 1020    Education provided Yes   Education Details CVA warning sigs and risk factors, proper technique for self ROM ex in sh. flexion, edema management including use of compression glove   Person(s) Educated Patient;Spouse   Methods Explanation;Demonstration   Comprehension Verbalized understanding          OT Short Term Goals - 03/10/15 1030    OT SHORT TERM GOAL #1   Title  Independent w/ initial HEP for RUE (DUE 04/09/15)   Time 4   Period Weeks   Status New   OT SHORT TERM GOAL #2   Title Independent w/ edema management strategies for Rt hand   Time 4   Period Weeks   Status New   OT SHORT TERM GOAL #3   Title Improve RUE functional use as demo by performing 22 or > blocks on Box & Blocks test   Baseline 15   Time 4   Period Weeks   Status New  OT SHORT TERM GOAL #4   Title Pt to increase grip strength to 35 lbs or greater Rt hand to assist with opening containers   Baseline 22 lbs   Time 4   Period Weeks   Status New   OT SHORT TERM GOAL #5   Title Pt to demo 125* shoulder flexion to retrieve/replace lightweight object on high shelf with min compensations and no pain   Baseline 110-120* with moderate compensations into IR and forearm pronation, occasional pain from spasms   Time 4   Period Weeks   Status New           OT Long Term Goals - 03/10/15 1034    OT LONG TERM GOAL #1   Title Independent w/ updated HEP prn (due 05/10/15)   Time 8   Period Weeks   Status New   OT LONG TERM GOAL #2   Title Improve coordination Rt hand as evidenced by performing 9 hole peg test in under 2 minutes   Baseline eval = unable   Time 8   Period Weeks   Status New   OT LONG TERM GOAL #3   Title Improve RUE function as evidenced by performing 30 or > blocks on Box & Blocks test   Baseline eval = 15   Time 8   Period Weeks   Status New   OT LONG TERM GOAL #4   Title Pt to perform simple stovetop cooking task at mod I level safely   Baseline dependent   Time 8   Period Weeks   Status New   OT LONG TERM GOAL #5   Title Pt to perform light household cleaning tasks at mod I level safely   Baseline dependent    Time 8   Period Weeks   Status New               Plan - 03/10/15 1026    Clinical Impression Statement Pt is a 64 y.o. male who presents to outpatient rehab s/p CVA on 02/09/15. Pt with Rt nondominant hemiparesis demo decreased ROM,  strength, and coordination. Pt also with moderate edema in Rt hand. Pt  has premorbid Rt eye blindness d/t retinopathy.    Pt will benefit from skilled therapeutic intervention in order to improve on the following deficits (Retired) Decreased cognition;Increased edema;Impaired flexibility;Pain;Decreased coordination;Decreased mobility;Impaired sensation;Improper body mechanics;Decreased activity tolerance;Decreased endurance;Decreased range of motion;Decreased strength;Increased muscle spasms;Impaired tone;Decreased balance;Decreased knowledge of precautions;Decreased safety awareness;Impaired UE functional use   Rehab Potential Good   Clinical Impairments Affecting Rehab Potential HTN   OT Frequency 2x / week   OT Duration 8 weeks  PLUS EVALUATION   OT Treatment/Interventions Self-care/ADL training;Electrical Stimulation;Therapeutic exercise;Cognitive remediation/compensation;Moist Heat;Parrafin;Neuromuscular education;Splinting;Fluidtherapy;Therapist, nutritional;Therapeutic exercises;Patient/family education;DME and/or AE instruction;Manual Therapy;Passive range of motion;Therapeutic activities;Balance training   Plan MONITOR BP, assess compression glove and edema, provide HEP for RUE   Consulted and Agree with Plan of Care Patient;Family member/caregiver   Family Member Consulted WIFE        Problem List Patient Active Problem List   Diagnosis Date Noted  . Dysphagia following cerebral infarction 02/15/2015  . Cerebral infarction due to thrombosis of basilar artery   . CKD stage 2 due to type 2 diabetes mellitus 02/12/2015  . Right hemiparesis   . CVA (cerebral infarction) 02/10/2015  . CKD (chronic kidney disease) 02/10/2015  . Type 2 diabetes mellitus with diabetic neuropathy 02/10/2015  . Neck pain   . Numbness and tingling 02/09/2015  . Back  pain, acute 10/30/2012  . Hematuria of undiagnosed cause 10/30/2012  . Proteinuria 10/30/2012  . Left shoulder pain 07/17/2012  .  Plantar fasciitis, bilateral 07/17/2012  . HEADACHE 01/07/2009  . Peripheral neuropathy 11/06/2007  . ERECTILE DYSFUNCTION, MILD 11/06/2007  . 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 Essential hypertension 07/08/2007  . EXTRINSIC ASTHMA, UNSPECIFIED 06/25/2007    Carey Bullocks, OTR/L 03/10/2015, 10:40 AM  [facility] [address] [place], [place], [address] Phone: [phone]   Fax:  [phone]

## 2015-03-14 ENCOUNTER — Other Ambulatory Visit: Payer: Self-pay | Admitting: Physical Medicine & Rehabilitation

## 2015-03-14 DIAGNOSIS — I69391 Dysphagia following cerebral infarction: Secondary | ICD-10-CM | POA: Insufficient documentation

## 2015-03-16 ENCOUNTER — Ambulatory Visit: Payer: BC Managed Care – PPO

## 2015-03-16 ENCOUNTER — Ambulatory Visit: Payer: BC Managed Care – PPO | Admitting: Physical Therapy

## 2015-03-16 ENCOUNTER — Encounter: Payer: Self-pay | Admitting: Physical Therapy

## 2015-03-16 ENCOUNTER — Ambulatory Visit: Payer: BC Managed Care – PPO | Admitting: Occupational Therapy

## 2015-03-16 VITALS — BP 149/75

## 2015-03-16 VITALS — BP 154/80 | HR 61

## 2015-03-16 DIAGNOSIS — I69359 Hemiplegia and hemiparesis following cerebral infarction affecting unspecified side: Secondary | ICD-10-CM

## 2015-03-16 DIAGNOSIS — IMO0002 Reserved for concepts with insufficient information to code with codable children: Secondary | ICD-10-CM

## 2015-03-16 DIAGNOSIS — R41841 Cognitive communication deficit: Secondary | ICD-10-CM

## 2015-03-16 DIAGNOSIS — M6281 Muscle weakness (generalized): Secondary | ICD-10-CM

## 2015-03-16 DIAGNOSIS — R1313 Dysphagia, pharyngeal phase: Secondary | ICD-10-CM

## 2015-03-16 DIAGNOSIS — R609 Edema, unspecified: Secondary | ICD-10-CM

## 2015-03-16 DIAGNOSIS — R269 Unspecified abnormalities of gait and mobility: Secondary | ICD-10-CM | POA: Diagnosis not present

## 2015-03-16 NOTE — Patient Instructions (Signed)
Continue to complete exercises twice a day.

## 2015-03-16 NOTE — Therapy (Signed)
[facility] [address] [place], [place], [address] Phone: [phone]   Fax:  [phone]  Occupational Therapy Treatment  Patient Details  Name: Russell Austin MRN: [id number] Date of Birth: 18-Oct-1950 Referring Provider:  Dorena Cookey, MD  Encounter Date: 03/16/2015      OT End of Session - 03/16/15 1058    Visit Number 2   Number of Visits 17   Date for OT Re-Evaluation 05/10/15   Authorization Type BCBS PPO   OT Start Time 0845   OT Stop Time 0930   OT Time Calculation (min) 45 min   Activity Tolerance Patient tolerated treatment well      Past Medical History  Diagnosis Date  . Diabetes mellitus type II   . Hypertension   . ED (erectile dysfunction)   . Diabetic retinopathy FOLLOWED BY DR Zadie Rhine  . 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 Glaucoma of both eyes   . Left hydrocele     Past Surgical History  Procedure Laterality Date  . Undescended right testicle removed  1994  . Right eye vitrectomy/ insertion glaucoma seton/ laser repair  12-13-2008    RETINAL ARTERY OCCLUSION /NEOVASCULAR GLAUCOMA/ HEMORRHAGE  . Right eye vitretomy/ insertion glaucoma seton x2/ laser  03-24-2009    RECURRENT HEMORRHAGE/ OCCLUSION INTERNAL SETON  . Left eye laser retina repair  SEPT 2012  . Appendectomy  AGE EARLY 20'S  . Hydrocele excision  03/31/2012    Procedure: HYDROCELECTOMY ADULT;  Surgeon: Franchot Gallo, MD;  Location: [facility];  Service: Urology;  Laterality: Left;  45 MINS      Filed Vitals:   03/16/15 0902  BP: 149/75    Visit Diagnosis:  Lack of coordination due to stroke  Hemiparesis affecting nondominant side as late effect of cerebrovascular accident  Edema      Subjective Assessment - 03/16/15 0849    Subjective  "The glove helps a little bit"   Pertinent History cerebral infarct 02/09/15, diabetic retinopathy with Rt eye legally blind, DM    Patient Stated Goals to return to cooking, cleaning and use of RT arm   Currently in Pain? No/denies                      OT Treatments/Exercises (OP) - 03/16/15 0001    ADLs   ADL Comments Reviewed and provided handouts on CVA warning signs and risk factors. Reviewed and provided handout on edema management for Rt hand   Neurological Re-education Exercises   Other Exercises 1 Pt issued HEP for RUE: including self ROM in sh. flexion, table slides, cane exercise in flexion to eye level (seated), supination stretch, and initial coordination ex's for Rt hand. See pt instructions                OT Education - 03/16/15 0931    Education provided Yes   Education Details Initial HEP for RUE and coordination ex's. Handout for edema management for Rt hand, handout for CVA education   Person(s) Educated Patient   Methods Explanation;Demonstration;Handout   Comprehension Verbalized understanding;Returned demonstration          OT Short Term Goals - 03/10/15 1030    OT SHORT TERM GOAL #1   Title Independent w/ initial HEP for RUE (DUE 04/09/15)   Time 4   Period Weeks   Status New   OT SHORT TERM GOAL #2   Title Independent w/ edema  management strategies for Rt hand   Time 4   Period Weeks   Status New   OT SHORT TERM GOAL #3   Title Improve RUE functional use as demo by performing 22 or > blocks on Box & Blocks test   Baseline 15   Time 4   Period Weeks   Status New   OT SHORT TERM GOAL #4   Title Pt to increase grip strength to 35 lbs or greater Rt hand to assist with opening containers   Baseline 22 lbs   Time 4   Period Weeks   Status New   OT SHORT TERM GOAL #5   Title Pt to demo 125* shoulder flexion to retrieve/replace lightweight object on high shelf with min compensations and no pain   Baseline 110-120* with moderate compensations into IR and forearm pronation, occasional pain from spasms   Time 4   Period Weeks   Status New           OT  Long Term Goals - 03/10/15 1034    OT LONG TERM GOAL #1   Title Independent w/ updated HEP prn (due 05/10/15)   Time 8   Period Weeks   Status New   OT LONG TERM GOAL #2   Title Improve coordination Rt hand as evidenced by performing 9 hole peg test in under 2 minutes   Baseline eval = unable   Time 8   Period Weeks   Status New   OT LONG TERM GOAL #3   Title Improve RUE function as evidenced by performing 30 or > blocks on Box & Blocks test   Baseline eval = 15   Time 8   Period Weeks   Status New   OT LONG TERM GOAL #4   Title Pt to perform simple stovetop cooking task at mod I level safely   Baseline dependent   Time 8   Period Weeks   Status New   OT LONG TERM GOAL #5   Title Pt to perform light household cleaning tasks at mod I level safely   Baseline dependent    Time 8   Period Weeks   Status New               Plan - 03/16/15 1058    Clinical Impression Statement Pt approximating STG's #1 and #2. Pt tolerating all ex's with no pain RUE. Occasional cues for proper positioning required.    Plan review HEP prn, NMR RUE, functional reaching RUE   OT Home Exercise Plan CVA warning signs and risk factors, edema management techniques, initial HEP 03/16/15.    Consulted and Agree with Plan of Care Patient        Problem List Patient Active Problem List   Diagnosis Date Noted  . Dysphagia S/P CVA (cerebrovascular accident) 03/14/2015  . Dysphagia following cerebral infarction 02/15/2015  . Cerebral infarction due to thrombosis of basilar artery   . CKD stage 2 due to type 2 diabetes mellitus 02/12/2015  . Right hemiparesis   . CVA (cerebral infarction) 02/10/2015  . CKD (chronic kidney disease) 02/10/2015  . Type 2 diabetes mellitus with diabetic neuropathy 02/10/2015  . Neck pain   . Numbness and tingling 02/09/2015  . Back pain, acute 10/30/2012  . Hematuria of undiagnosed cause 10/30/2012  . Proteinuria 10/30/2012  . Left shoulder pain 07/17/2012  .  Plantar fasciitis, bilateral 07/17/2012  . HEADACHE 01/07/2009  . Peripheral neuropathy 11/06/2007  . ERECTILE DYSFUNCTION, MILD 11/06/2007  .  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 Essential hypertension 07/08/2007  . EXTRINSIC ASTHMA, UNSPECIFIED 06/25/2007    Carey Bullocks, OTR/L 03/16/2015, 11:01 AM  [facility] [address] [place], [place], [address] Phone: [phone]   Fax:  [phone]

## 2015-03-16 NOTE — Therapy (Signed)
[facility] [address] [place], [place], [address] Phone: [phone]   Fax:  [phone]  Speech Language Pathology Treatment  Patient Details  Name: Russell Austin MRN: [id number] Date of Birth: [birth year] Referring Provider:  Dorena Cookey, MD  Encounter Date: 03/16/2015      End of Session - 03/16/15 1701    Visit Number 2   Number of Visits 16   Date for SLP Re-Evaluation 05/10/15   SLP Start Time 0803   SLP Stop Time  0845   SLP Time Calculation (min) 42 min   Activity Tolerance Patient tolerated treatment well      Past Medical History  Diagnosis Date  . Diabetes mellitus type II   . Hypertension   . ED (erectile dysfunction)   . Diabetic retinopathy FOLLOWED BY DR Zadie Rhine  . 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 Glaucoma of both eyes   . Left hydrocele     Past Surgical History  Procedure Laterality Date  . Undescended right testicle removed  1994  . Right eye vitrectomy/ insertion glaucoma seton/ laser repair  12-13-2008    RETINAL ARTERY OCCLUSION /NEOVASCULAR GLAUCOMA/ HEMORRHAGE  . Right eye vitretomy/ insertion glaucoma seton x2/ laser  03-24-2009    RECURRENT HEMORRHAGE/ OCCLUSION INTERNAL SETON  . Left eye laser retina repair  SEPT 2012  . Appendectomy  AGE EARLY 20'S  . Hydrocele excision  03/31/2012    Procedure: HYDROCELECTOMY ADULT;  Surgeon: Franchot Gallo, MD;  Location: [facility];  Service: Urology;  Laterality: Left;  45 MINS      There were no vitals filed for this visit.  Visit Diagnosis: Pharyngeal dysphagia  Cognitive communication deficit             ADULT SLP TREATMENT - 03/16/15 0840    General Information   Behavior/Cognition Pleasant mood;Cooperative;Alert   Treatment Provided   Treatment provided Dysphagia   Dysphagia Treatment   Temperature Spikes Noted No   Respiratory Status Room air   Treatment  Methods Skilled observation;Therapeutic exercise;Patient/caregiver education   Patient observed directly with PO's Yes   Type of PO's observed Ice chips   Liquids provided via --   Pharyngeal Phase Signs & Symptoms Delayed throat clear  approx 5% of the time   Other treatment/comments Pt did not bring in HEP for swallowing as requested so SLP worked through effortful swallow and Masako with pt. Pt often demo'd decr'd strength with Masako - unable to achieve without ice chip/s. SLP educated pt re: why Masako so important, including role of tongue base in WNL swallow   Pain Assessment   Pain Assessment No/denies pain   Assessment / Recommendations / Plan   Plan Continue with current plan of care   Progression Toward Goals   Progression toward goals Progressing toward goals          SLP Education - 03/16/15 1701    Education provided Yes   Education Details swallowing HEP, swallow anatomy   Person(s) Educated Patient   Methods Explanation;Demonstration;Verbal cues   Comprehension Verbalized understanding;Returned demonstration          SLP Short Term Goals - 03/16/15 0826    SLP SHORT TERM GOAL #1   Title pt will complete swallowing HEP with occasional min A   Time 4   Period Weeks   Status On-going   SLP SHORT TERM GOAL #2   Title pt will demo awareness of safe swallow  techniques by using them with POs with rare min A over three sessions   Time 4   Period Weeks   Status On-going   SLP SHORT TERM GOAL #3   Title pt will tell SLP three s/s aspiration PNA   Time 4   Period Weeks   Status On-going          SLP Long Term Goals - 03/16/15 1704    SLP LONG TERM GOAL #1   Title pt will complete HEP with modified independence   Time 8   Period Weeks   Status On-going   SLP LONG TERM GOAL #2   Title pt will demo understanding of consequences of not following recommendations with water protocol   Time 8   Period Weeks   Status On-going   SLP LONG TERM GOAL #3   Title  pt will demo safe swallow strategies with POs, during ST session, over 4 total visits   Time 8   Period Weeks   Status On-going          Plan - 03/16/15 1702    Clinical Impression Statement Pt with obvious difficulty with swallowing muscle strength today in therapy. Skilled ST needed to cont in order to assess success with procedure for HEP.   Speech Therapy Frequency 2x / week   Duration --  8 weeks   Treatment/Interventions Aspiration precaution training;Pharyngeal strengthening exercises;Oral motor exercises;Diet toleration management by SLP;Trials of upgraded texture/liquids;SLP instruction and feedback   Potential to Achieve Goals Good        Problem List Patient Active Problem List   Diagnosis Date Noted  . Dysphagia S/P CVA (cerebrovascular accident) 03/14/2015  . Dysphagia following cerebral infarction 02/15/2015  . Cerebral infarction due to thrombosis of basilar artery   . CKD stage 2 due to type 2 diabetes mellitus 02/12/2015  . Right hemiparesis   . CVA (cerebral infarction) 02/10/2015  . CKD (chronic kidney disease) 02/10/2015  . Type 2 diabetes mellitus with diabetic neuropathy 02/10/2015  . Neck pain   . Numbness and tingling 02/09/2015  . Back pain, acute 10/30/2012  . Hematuria of undiagnosed cause 10/30/2012  . Proteinuria 10/30/2012  . Left shoulder pain 07/17/2012  . Plantar fasciitis, bilateral 07/17/2012  . HEADACHE 01/07/2009  . Peripheral neuropathy 11/06/2007  . ERECTILE DYSFUNCTION, MILD 11/06/2007  . 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 Essential hypertension 07/08/2007  . EXTRINSIC ASTHMA, UNSPECIFIED 06/25/2007    [facility] , MS, CCC-SLP  03/16/2015, 5:05 PM  [facility] [address] [place], [place], [address] Phone: [phone]   Fax:  [phone]

## 2015-03-16 NOTE — Therapy (Signed)
[facility] [address] [place], [place], [address] Phone: [phone]   Fax:  [phone]  Physical Therapy Treatment  Patient Details  Name: Russell Austin MRN: [id number] Date of Birth: [birth year] Referring Provider:  Dorena Cookey, MD  Encounter Date: 03/16/2015      PT End of Session - 03/16/15 0935    Visit Number 2   Number of Visits 16   Date for PT Re-Evaluation 05/08/15   Authorization Type check benefits with insurance specialist to clarify*   PT Start Time 0932   PT Stop Time 1014   PT Time Calculation (min) 42 min   Equipment Utilized During Treatment Gait belt   Activity Tolerance Treatment limited secondary to medical complications (Comment)  elevated BP   Behavior During Therapy [facility] for tasks assessed/performed      Past Medical History  Diagnosis Date  . Diabetes mellitus type II   . Hypertension   . ED (erectile dysfunction)   . Diabetic retinopathy FOLLOWED BY DR Zadie Rhine  . 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 Glaucoma of both eyes   . Left hydrocele     Past Surgical History  Procedure Laterality Date  . Undescended right testicle removed  1994  . Right eye vitrectomy/ insertion glaucoma seton/ laser repair  12-13-2008    RETINAL ARTERY OCCLUSION /NEOVASCULAR GLAUCOMA/ HEMORRHAGE  . Right eye vitretomy/ insertion glaucoma seton x2/ laser  03-24-2009    RECURRENT HEMORRHAGE/ OCCLUSION INTERNAL SETON  . Left eye laser retina repair  SEPT 2012  . Appendectomy  AGE EARLY 20'S  . Hydrocele excision  03/31/2012    Procedure: HYDROCELECTOMY ADULT;  Surgeon: Franchot Gallo, MD;  Location: [facility];  Service: Urology;  Laterality: Left;  45 MINS      Filed Vitals:   03/16/15 0938 03/16/15 0956  BP: 160/82 154/80  Pulse: 63 61    Visit Diagnosis:  Lack of coordination due to stroke  Abnormality of gait  Generalized muscle  weakness      Subjective Assessment - 03/16/15 0934    Subjective Pt reports no pain or falls since PT eval.    Patient Stated Goals Be able to move freely (walk, pick up things, drive); regain independence   Currently in Pain? No/denies            [facility] Adult PT Treatment/Exercise - 03/16/15 0935    Transfers   Sit to Stand 4: Min guard;With upper extremity assist;From chair/3-in-1;From bed;With armrests   Sit to Stand Details Verbal cues for technique;Verbal cues for precautions/safety   Sit to Stand Details (indicate cue type and reason) cues to use arms to assist with anterior weight shifting with standing   Stand to Sit 4: Min guard;With upper extremity assist;With armrests;To chair/3-in-1;To bed;4: Min assist   Stand to Sit Details (indicate cue type and reason) Verbal cues for technique;Verbal cues for precautions/safety   Stand to Sit Details cues to turn all the way and square self with surface before sitting down, cues to reach back with sitting down   Ambulation/Gait   Ambulation/Gait Yes   Ambulation/Gait Assistance 3: Mod assist;4: Min assist  min with SBQC and tripod tip   Ambulation/Gait Assistance Details mod assist with cues to correct gait deviations with HHA. Pt needed less assistance with SBQC and tripod tip on straight cane. Most stable with balance using SBQC.with gait. Pt's gait deviations increase as he fatigues.  Ambulation Distance (Feet) 40 Feet  x3 HHA, SBQC, Tripod tip;140 ft x 1   Assistive device 1 person hand held assist;Small based quad cane   Gait Pattern Step-through pattern;Decreased stride length;Lateral trunk lean to left;Decreased step length - left;Decreased stance time - right;Poor foot clearance - right;Scissoring;Narrow base of support;Decreased weight shift to right;Decreased trunk rotation;Decreased hip/knee flexion - right;Decreased dorsiflexion - right  ataxic movement with advancement of right leg at times    Ambulation Surface Level;Indoor   Gait velocity 19.19 sec's   Stairs Yes   Stairs Assistance 4: Min assist   Stairs Assistance Details (indicate cue type and reason) 1 rail up with reciprocal pattern, increased time needed. Educated pt on use of 1 rail and descending sideways, pt min assist to perform this technique. (pt ususall sits down and scoots down stairs on his buttocks).                     Stair Management Technique One rail Left;Alternating pattern;Step to pattern;Sideways;Forwards   Number of Stairs 4   Timed Up and Go Test   Normal TUG (seconds) 19.63  with SBQC     Exercises for HEP: Bridge 5 sec's x 10 reps Right leg straight leg raises, 3-5 sec hold x 10 reps Hook lying on mat: right hip flexion off mat lowering foot to floor and then back up onto mat, x 10 reps. Sit<>stand x 10 reps with no UE assist and emphasis on equal leg weight bearing.       PT Education - 03/16/15 1556    Education provided Yes   Education Details HEP: for lower extremity strengthening   Person(s) Educated Patient   Methods Explanation;Demonstration;Handout   Comprehension Verbalized understanding;Returned demonstration;Need further instruction          PT Short Term Goals - 03/09/15 1010    PT SHORT TERM GOAL #1   Title The patient will verbalize understanding of stroke risk factors and warning signs.   Baseline Target date 04/08/2015   Time 4   Period Weeks   PT SHORT TERM GOAL #2   Title The patient will improve Berg score from 30/56 up to 38/56 to demo decreased risk for falls.   Baseline Target date 04/08/2015   Time 4   Period Weeks   PT SHORT TERM GOAL #3   Title The patient will have gait further assessed and goal to follow (held today due to elvated BP).   Baseline Target date 04/08/2015   Time 4   Period Weeks   PT SHORT TERM GOAL #4   Title The patient will be able to negotiate his steps at home without sitting down to descend steps per report.   Baseline Target date  04/08/2015   Time 4   Period Weeks   PT SHORT TERM GOAL #5   Title The patient will transfer sit<>stand and stand pivot modified indep.   Baseline Target date 04/08/2015   Time 4   Period Weeks           PT Long Term Goals - 03/09/15 1022    PT LONG TERM GOAL #1   Title The patient will be indep with HEP.   Baseline Target date 05/08/2015   Time 8   Period Weeks   PT LONG TERM GOAL #2   Title The patient will improve Berg from 30/56 up to > or equal to 42/56.   Baseline Target date 05/08/2015   Time 8   Period  Weeks   PT LONG TERM GOAL #3   Title Add LTG for gait once BP stable and can assess (gait speed and level of assistance).   Baseline Target date 05/08/2015   Time 8   Period Weeks   PT LONG TERM GOAL #4   Title The patient will be further assessed on stairs and goal to follow, as indicated.   Baseline Target date 05/08/2015   Time 8   Period Weeks   PT LONG TERM GOAL #5   Title Improve SIS Mobility by 20% (baseline 41.7%).   Baseline Target date 05/08/2015   Time 8   Period Weeks           Plan - 03/16/15 0935    Clinical Impression Statement Gait addressed with objective data obtained. Primary PT to set goals from today's information. Pt needed at least min assist with gait, regardless of device that was used. Pt advised to not walk by himself at this time due to balance issues and gait deviations. Pt verbalized understanding. Pt edcuated on exercises for home for LE strengthening. Pt making steady progress toward goals. BP stable with session today.                          Pt will benefit from skilled therapeutic intervention in order to improve on the following deficits Abnormal gait;Decreased activity tolerance;Decreased balance;Difficulty walking;Postural dysfunction;Impaired sensation;Decreased mobility;Decreased strength   Rehab Potential Good   Clinical Impairments Affecting Rehab Potential HTN   PT Frequency 2x / week   PT Duration 8 weeks   PT  Treatment/Interventions ADLs/Self Care Home Management;Gait training;Neuromuscular re-education;Balance training;Therapeutic exercise;Therapeutic activities;Functional mobility training;Stair training;Patient/family education;Manual techniques   PT Next Visit Plan continue to work on strengthening,  alance and gait   PT Home Exercise Plan LE strengthening, stretching, general mobility   Consulted and Agree with Plan of Care Patient        Problem List Patient Active Problem List   Diagnosis Date Noted  . Dysphagia S/P CVA (cerebrovascular accident) 03/14/2015  . Dysphagia following cerebral infarction 02/15/2015  . Cerebral infarction due to thrombosis of basilar artery   . CKD stage 2 due to type 2 diabetes mellitus 02/12/2015  . Right hemiparesis   . CVA (cerebral infarction) 02/10/2015  . CKD (chronic kidney disease) 02/10/2015  . Type 2 diabetes mellitus with diabetic neuropathy 02/10/2015  . Neck pain   . Numbness and tingling 02/09/2015  . Back pain, acute 10/30/2012  . Hematuria of undiagnosed cause 10/30/2012  . Proteinuria 10/30/2012  . Left shoulder pain 07/17/2012  . Plantar fasciitis, bilateral 07/17/2012  . HEADACHE 01/07/2009  . Peripheral neuropathy 11/06/2007  . ERECTILE DYSFUNCTION, MILD 11/06/2007  . 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 Essential hypertension 07/08/2007  . EXTRINSIC ASTHMA, UNSPECIFIED 06/25/2007    Willow Ora 03/16/2015, 4:09 PM  Willow Ora, PTA, [facility] [address] [place], [place] [address] [phone] 03/16/2015, 4:09 PM

## 2015-03-16 NOTE — Patient Instructions (Addendum)
!.   Flexion (Assistive)   Clasp hands together and raise arms above head, keeping elbows as straight as possible. Can be done sitting or lying. Repeat _10___ times. Do _2-3___ sessions per day.  2. Flexion (Passive)   Sitting upright, slide forearm ON TOWEL forward along table and back, then do big circles, bending from the waist until a stretch is felt. Repeat _10___ times. Do __2-3__ sessions per day. Do NOT go as far as picture depicts    3. Supination (Passive)   Keep elbow bent at right angle and held firmly at side. Use other hand to turn forearm until palm faces upward. Hold _10___ seconds. Repeat __5__ times. Do __2-3__ sessions per day.   SHOULDER: Flexion - Sitting   Hold cane with both hands (Rt hand on hook, thumb side up). Raise arms up. Keep elbows straight. Hold _2__ seconds. No weight! _10__ reps per set, __2-3_ sets per day,       Coordination Activities  Perform the following activities for 10 minutes 1-2 times per day with left hand(s).  {Coordination Activities: Coordination Activities   Toss ball in air and catch with the same hand.  Flip cards 1 at a time as fast as you can.  Pick up coins, buttons, marbles, dried beans/pasta, nuts/bolts, checkers of different sizes and place in container.  Pick up large coins and place in container or coin bank.  Pick up large coins or checkers and stack.

## 2015-03-16 NOTE — Patient Instructions (Signed)
Bridge Pose  Arms across your chest, lift hips off bed/mat. Hold for 5 seconds. Repeat __10__ times. 1-2 times a day. Copyright  VHI. All rights reserved.  Straight Leg Raise   Tighten stomach and slowly raise right leg __3-4__ inches from floor/bed. Hold in air for 5 seconds. Keep knee as straight as possible. Repeat __10__ times per set. Do __1_ sets per session. Do __1-2__ sessions per day.  http://orth.exer.us/1102   Copyright  VHI. All rights reserved.  Hip Flexor Stretch   Lying on back near edge of bed, bend legs with feet flat on bed. Keeping right knee bent, lift that leg off edge of bed to touch floor (or stool if bed is too high) and the lift the leg back onto the bed. Repeat 10 reps. 1-2 times a day.  Copyright  VHI. All rights reserved.  Functional Quadriceps: Sit to Stand   Sit on edge of chair, feet flat on floor. Stand upright, extending knees fully. Repeat __10__ times per set. Do __1__ sets per session. Do _1-2___ sessions per day.  http://orth.exer.us/734   Copyright  VHI. All rights reserved.

## 2015-03-17 ENCOUNTER — Encounter: Payer: Self-pay | Admitting: Internal Medicine

## 2015-03-17 ENCOUNTER — Ambulatory Visit (INDEPENDENT_AMBULATORY_CARE_PROVIDER_SITE_OTHER): Payer: BC Managed Care – PPO | Admitting: Internal Medicine

## 2015-03-17 ENCOUNTER — Encounter: Payer: Self-pay | Admitting: Rehabilitative and Restorative Service Providers"

## 2015-03-17 ENCOUNTER — Ambulatory Visit: Payer: BC Managed Care – PPO | Admitting: Family Medicine

## 2015-03-17 VITALS — BP 140/80 | HR 68 | Temp 98.0°F | Resp 20 | Ht 69.0 in | Wt 178.0 lb

## 2015-03-17 DIAGNOSIS — G8191 Hemiplegia, unspecified affecting right dominant side: Secondary | ICD-10-CM

## 2015-03-17 DIAGNOSIS — I6302 Cerebral infarction due to thrombosis of basilar artery: Secondary | ICD-10-CM

## 2015-03-17 DIAGNOSIS — R269 Unspecified abnormalities of gait and mobility: Secondary | ICD-10-CM

## 2015-03-17 DIAGNOSIS — M6281 Muscle weakness (generalized): Secondary | ICD-10-CM

## 2015-03-17 DIAGNOSIS — G8190 Hemiplegia, unspecified affecting unspecified side: Secondary | ICD-10-CM

## 2015-03-17 DIAGNOSIS — I10 Essential (primary) hypertension: Secondary | ICD-10-CM

## 2015-03-17 DIAGNOSIS — E1140 Type 2 diabetes mellitus with diabetic neuropathy, unspecified: Secondary | ICD-10-CM | POA: Diagnosis not present

## 2015-03-17 DIAGNOSIS — N183 Chronic kidney disease, stage 3 (moderate): Secondary | ICD-10-CM | POA: Diagnosis not present

## 2015-03-17 MED ORDER — METFORMIN HCL 1000 MG PO TABS: 1000.0000 mg | ORAL_TABLET | Freq: Two times a day (BID) | ORAL | Status: DC

## 2015-03-17 NOTE — Progress Notes (Signed)
Pre visit review using our clinic review tool, if applicable. No additional management support is needed unless otherwise documented below in the visit note. 

## 2015-03-17 NOTE — Progress Notes (Signed)
Subjective:    Patient ID: Russell Austin, male    DOB: 09-Sep-1950, 64 y.o.   MRN: [id number]  HPI  Admit date: 02/14/2015 Discharge date: 03/03/2015  Discharge Diagnoses:  Principal Problem:  Cerebral infarction due to thrombosis of basilar artery Active Problems:  Type 2 diabetes mellitus with diabetic neuropathy  CKD stage 2 due to type 2 diabetes mellitus  Right hemiparesis  Dysphagia following cerebral infarction  Lab Results  Component Value Date   HGBA1C 7.5* 02/10/2015   64 year old patient seen today following a recent hospital discharge. Hospital admission, gated by acute renal failure with hyperkalemia.  Renal indices more recently have normalized.  Metformin therapy was discontinued during the hospital.  In the past.  She had been on 1000 mg twice daily. Fasting blood sugars are generally in the 90 to 1:30 range.  Afternoon blood sugars approach 200 He continues to receive outpatient physical therapy twice weekly and is making nice gains. He has hypertension and dyslipidemia. On complaint today is persistent swelling of his right hand that has been chronic since his stroke  Past Medical History  Diagnosis Date  . Diabetes mellitus type II   . Hypertension   . ED (erectile dysfunction)   . Diabetic retinopathy FOLLOWED BY DR Zadie Rhine  . 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 Glaucoma of both eyes   . Left hydrocele     History   Social History  . Marital Status: Married    Spouse Name: N/A  . Number of Children: N/A  . Years of Education: N/A   Occupational History  . Not on file.   Social History Main Topics  . Smoking status: Never Smoker   . Smokeless tobacco: Never Used  . Alcohol Use: No  . Drug Use: No  . Sexual Activity: Not on file   Other Topics Concern  . Not on file   Social History Narrative    Past Surgical History  Procedure Laterality Date  . Undescended right testicle removed  1994  . Right eye  vitrectomy/ insertion glaucoma seton/ laser repair  12-13-2008    RETINAL ARTERY OCCLUSION /NEOVASCULAR GLAUCOMA/ HEMORRHAGE  . Right eye vitretomy/ insertion glaucoma seton x2/ laser  03-24-2009    RECURRENT HEMORRHAGE/ OCCLUSION INTERNAL SETON  . Left eye laser retina repair  SEPT 2012  . Appendectomy  AGE EARLY 20'S  . Hydrocele excision  03/31/2012    Procedure: HYDROCELECTOMY ADULT;  Surgeon: Franchot Gallo, MD;  Location: [facility];  Service: Urology;  Laterality: Left;  1 MINS      Family History  Problem Relation Age of Onset  . Glaucoma Mother     Allergies  Allergen Reactions  . Lactose Intolerance (Gi) Diarrhea  . Apple Pectin [Pectin] Itching    ITCHY THROAT  . Peach [Prunus Persica] Itching    ITCHY THROAT    Current Outpatient Prescriptions on File Prior to Visit  Medication Sig Dispense Refill  . aspirin 325 MG tablet Take 1 tablet (325 mg total) by mouth daily. 30 tablet 0  . atorvastatin (LIPITOR) 10 MG tablet Take 1 tablet (10 mg total) by mouth daily at 6 PM. 30 tablet 0  . glipiZIDE (GLUCOTROL) 5 MG tablet Take 1 tablet (5 mg total) by mouth daily before breakfast. 30 tablet 0  . hydrochlorothiazide (HYDRODIURIL) 25 MG tablet Take 0.5 tablets (12.5 mg total) by mouth daily.    . insulin glargine (LANTUS) 100 UNIT/ML injection Inject 0.23 mLs (  23 Units total) into the skin at bedtime. 10 mL 11  . losartan (COZAAR) 25 MG tablet Take 1 tablet (25 mg total) by mouth daily. 30 tablet 0  . tiZANidine (ZANAFLEX) 2 MG tablet Take 1 tablet (2 mg total) by mouth 3 (three) times daily. 90 tablet 1  . traMADol (ULTRAM) 50 MG tablet Take 1 tablet (50 mg total) by mouth every 6 (six) hours as needed for moderate pain. 30 tablet 0   No current facility-administered medications on file prior to visit.    BP 140/80 mmHg  Pulse 68  Temp(Src) 98 F (36.7 C) (Oral)  Resp 20  Ht 5\' 9"  (1.753 m)  Wt 178 lb (80.74 kg)  BMI 26.27 kg/m2  SpO2  98%     Review of Systems  Constitutional: Negative for fever, chills, appetite change and fatigue.  HENT: Negative for congestion, dental problem, ear pain, hearing loss, sore throat, tinnitus, trouble swallowing and voice change.        Blind right eye  Eyes: Negative for pain, discharge and visual disturbance.  Respiratory: Negative for cough, chest tightness, wheezing and stridor.   Cardiovascular: Negative for chest pain, palpitations and leg swelling.  Gastrointestinal: Negative for nausea, vomiting, abdominal pain, diarrhea, constipation, blood in stool and abdominal distention.  Genitourinary: Negative for urgency, hematuria, flank pain, discharge, difficulty urinating and genital sores.  Musculoskeletal: Negative for myalgias, back pain, joint swelling, arthralgias, gait problem and neck stiffness.  Skin: Negative for rash.  Neurological: Positive for weakness. Negative for dizziness, syncope, speech difficulty, numbness and headaches.  Hematological: Negative for adenopathy. Does not bruise/bleed easily.  Psychiatric/Behavioral: Negative for behavioral problems and dysphoric mood. The patient is not nervous/anxious.        Objective:   Physical Exam  Constitutional: He is oriented to person, place, and time. He appears well-developed.  Blood pressure 138/70  HENT:  Head: Normocephalic.  Right Ear: External ear normal.  Left Ear: External ear normal.  Eyes: Conjunctivae and EOM are normal.  Neck: Normal range of motion.  Cardiovascular: Normal rate and normal heart sounds.   Pulmonary/Chest: Breath sounds normal.  Abdominal: Bowel sounds are normal.  Musculoskeletal: Normal range of motion. He exhibits edema. He exhibits no tenderness.  Edema in his right hand  Neurological: He is alert and oriented to person, place, and time.  Right-sided weakness  Psychiatric: He has a normal mood and affect. His behavior is normal.          Assessment & Plan:   Diabetes  mellitus.  Renal indices have now normalized.  Will resume metformin therapy.  Will discontinue glipizide and continue insulin therapy.  Patient will loss.  Fasting blood sugars carefully.  Is aware that he may need to down titrate Hypertension.  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  Continue active physical therapy  Recheck 6 weeks

## 2015-03-17 NOTE — Patient Instructions (Signed)
Limit your sodium (Salt) intake   Please check your hemoglobin A1c every 3 months  Return in 6 weeks for follow-up

## 2015-03-18 ENCOUNTER — Encounter: Payer: Self-pay | Admitting: Physical Therapy

## 2015-03-18 ENCOUNTER — Ambulatory Visit: Payer: BC Managed Care – PPO | Admitting: Physical Therapy

## 2015-03-18 VITALS — BP 133/73 | HR 68

## 2015-03-18 DIAGNOSIS — R269 Unspecified abnormalities of gait and mobility: Secondary | ICD-10-CM | POA: Diagnosis not present

## 2015-03-18 DIAGNOSIS — M6281 Muscle weakness (generalized): Secondary | ICD-10-CM

## 2015-03-18 DIAGNOSIS — IMO0002 Reserved for concepts with insufficient information to code with codable children: Secondary | ICD-10-CM

## 2015-03-18 NOTE — Therapy (Signed)
[facility] [address] [place], [place], [address] Phone: [phone]   Fax:  [phone]  Physical Therapy Treatment  Patient Details  Name: Russell Austin MRN: [id number] Date of Birth: 11-Jan-1951 Referring Provider:  Dorena Cookey, MD  Encounter Date: 03/18/2015      PT End of Session - 03/18/15 0806    Visit Number 3   Number of Visits 16   Date for PT Re-Evaluation 05/08/15   Authorization Type check benefits with insurance specialist to clarify*   PT Start Time 0803   PT Stop Time 0845   PT Time Calculation (min) 42 min   Equipment Utilized During Treatment Gait belt   Activity Tolerance Treatment limited secondary to medical complications (Comment)  elevated BP   Behavior During Therapy [facility] for tasks assessed/performed      Past Medical History  Diagnosis Date  . Diabetes mellitus type II   . Hypertension   . ED (erectile dysfunction)   . Diabetic retinopathy FOLLOWED BY DR Zadie Rhine  . 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 Glaucoma of both eyes   . Left hydrocele     Past Surgical History  Procedure Laterality Date  . Undescended right testicle removed  1994  . Right eye vitrectomy/ insertion glaucoma seton/ laser repair  12-13-2008    RETINAL ARTERY OCCLUSION /NEOVASCULAR GLAUCOMA/ HEMORRHAGE  . Right eye vitretomy/ insertion glaucoma seton x2/ laser  03-24-2009    RECURRENT HEMORRHAGE/ OCCLUSION INTERNAL SETON  . Left eye laser retina repair  SEPT 2012  . Appendectomy  AGE EARLY 20'S  . Hydrocele excision  03/31/2012    Procedure: HYDROCELECTOMY ADULT;  Surgeon: Franchot Gallo, MD;  Location: [facility];  Service: Urology;  Laterality: Left;  45 MINS      Filed Vitals:   03/18/15 0806  BP: 133/73  Pulse: 68    Visit Diagnosis:  Generalized muscle weakness  Lack of coordination due to stroke  Abnormality of gait      Subjective  Assessment - 03/18/15 0806    Subjective No new complaints. No falls or pain to report. Did his new HEP without any issues.   Currently in Pain? No/denies   Pain Score 0-No pain     Treatment: Exercise: Bridge 5 sec hold x 10 reps Bridge with resisted (red band) hip abduct/ER, x 10 reps x 2 sets Straight leg raises right leg, 2 sets of 10 reps with 5 sec hold's Hip flexion off/onto mat with knee flexion,right leg, 2# ankle weight, 2 sets of 10 reps Prone right leg hamstring curls, no weight, 2 sets of 10 reps, emphasis on slow, controled motion  BP after exercises 145/72, HR 78  Neuro Re-ed Single leg stance activities Alternating forward toe taps and cross toe taps x 10 each bil legs to both 4 inch box and 6 inch box, min assist with cues on posture and weight shift  Blue foam beam: With feet across beam: sit<>stand x 10 reps with min to mod assist, cues on posture and weight shifting anteriorly Standing with feet across beam: alternating forward heel taps and backward toe taps. Up to mod assist for balance with cues on posture, weight shifting/distrubution for balance and foot position on foam beam for improved stability.   BP: 147/79, HR 77 after neuro re-ed activities        PT Short Term Goals - 03/17/15 1220    PT SHORT TERM GOAL #1  Title The patient will verbalize understanding of stroke risk factors and warning signs.   Baseline Target date 04/08/2015   Time 4   Period Weeks   Status On-going   PT SHORT TERM GOAL #2   Title The patient will improve Berg score from 30/56 up to 38/56 to demo decreased risk for falls.   Baseline Target date 04/08/2015   Time 4   Period Weeks   Status On-going   PT SHORT TERM GOAL #3   Title The patient will have gait further assessed and goal to follow (held today due to elvated BP).   Baseline Met on 03/16/2015 and new goal below.   Time 4   Period Weeks   Status Achieved   PT SHORT TERM GOAL #4   Title The patient will be able  to negotiate his steps at home without sitting down to descend steps per report.   Baseline Target date 04/08/2015   Time 4   Period Weeks   Status On-going   PT SHORT TERM GOAL #5   Title The patient will transfer sit<>stand and stand pivot modified indep.   Baseline Target date 04/08/2015   Time 4   Period Weeks   Status On-going   Additional Short Term Goals   Additional Short Term Goals Yes   PT SHORT TERM GOAL #6   Title The patient will improve gait speed from 1.71 ft/sec up to 2.1 ft/sec to demo improving mobility.   Baseline Target date 04/08/2015   Time 4   Period Weeks   Status New   PT SHORT TERM GOAL #7   Title The patient will ambulate for household distances x 150 ft with CGA with least restrictive assistive device (baseline min A with SBQC).   Baseline Target date 04/08/2015   Time 4   Period Weeks   Status New           PT Long Term Goals - 03/17/15 1223    PT LONG TERM GOAL #1   Title The patient will be indep with HEP.   Baseline Target date 05/08/2015   Time 8   Period Weeks   Status On-going   PT LONG TERM GOAL #2   Title The patient will improve Berg from 30/56 up to > or equal to 42/56.   Baseline Target date 05/08/2015   Time 8   Period Weeks   Status On-going   PT LONG TERM GOAL #3   Title Ambulate x 300 ft with least restrictive assistive device and supervision for improved functional mobility in the home and limited community distances.   Baseline Target date 05/08/2015   Time 8   Period Weeks   Status New   PT LONG TERM GOAL #4   Title The patient will improve stair negotiate x 4 steps to supervision with step to pattern and one handrails.   Baseline Target date 05/08/2015   Time 8   Period Weeks   Status New   PT LONG TERM GOAL #5   Title Improve SIS Mobility by 20% (baseline 41.7%).   Baseline Target date 05/08/2015   Time 8   Period Weeks   Status On-going   Additional Long Term Goals   Additional Long Term Goals Yes   PT LONG TERM GOAL #6    Title Improve gait speed from 1.71 ft/sec to > or equal to 2.4 ft/sec for improved functional ambulation.   Baseline Target date 05/08/2015   Time 8   Period Weeks  Status New   PT LONG TERM GOAL #7   Title The patient will decrease TUG from 19.63 seconds to < or equal to 15 seconds to demo improved functional mobility.   Baseline Target date 05/08/2015   Time 8   Period Weeks   Status New           Plan - 03/18/15 7065    Clinical Impression Statement Pt challenged with strengthening and single leg balance activities today, reporting fatigue at end of session. Pt making progress toward goals.   Pt will benefit from skilled therapeutic intervention in order to improve on the following deficits Abnormal gait;Decreased activity tolerance;Decreased balance;Difficulty walking;Postural dysfunction;Impaired sensation;Decreased mobility;Decreased strength   Rehab Potential Good   Clinical Impairments Affecting Rehab Potential HTN   PT Frequency 2x / week   PT Duration 8 weeks   PT Treatment/Interventions ADLs/Self Care Home Management;Gait training;Neuromuscular re-education;Balance training;Therapeutic exercise;Therapeutic activities;Functional mobility training;Stair training;Patient/family education;Manual techniques   PT Next Visit Plan continue to work on strengthening,  balance and gait   PT Home Exercise Plan LE strengthening, stretching, general mobility   Consulted and Agree with Plan of Care Patient        Problem List Patient Active Problem List   Diagnosis Date Noted  . Dysphagia S/P CVA (cerebrovascular accident) 03/14/2015  . Dysphagia following cerebral infarction 02/15/2015  . Cerebral infarction due to thrombosis of basilar artery   . CKD stage 2 due to type 2 diabetes mellitus 02/12/2015  . Right hemiparesis   . CVA (cerebral infarction) 02/10/2015  . CKD (chronic kidney disease) 02/10/2015  . Type 2 diabetes mellitus with diabetic neuropathy 02/10/2015  . Neck  pain   . Numbness and tingling 02/09/2015  . Back pain, acute 10/30/2012  . Hematuria of undiagnosed cause 10/30/2012  . Proteinuria 10/30/2012  . Left shoulder pain 07/17/2012  . Plantar fasciitis, bilateral 07/17/2012  . HEADACHE 01/07/2009  . Peripheral neuropathy 11/06/2007  . ERECTILE DYSFUNCTION, MILD 11/06/2007  . 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 Essential hypertension 07/08/2007  . EXTRINSIC ASTHMA, UNSPECIFIED 06/25/2007    Willow Ora 03/18/2015, 4:10 PM  Willow Ora, PTA, [facility] [address] [place], [place] [address] [phone] 03/18/2015, 4:10 PM

## 2015-03-21 ENCOUNTER — Other Ambulatory Visit: Payer: Self-pay | Admitting: Physical Medicine & Rehabilitation

## 2015-03-21 ENCOUNTER — Encounter: Payer: Self-pay | Admitting: Physical Therapy

## 2015-03-21 ENCOUNTER — Ambulatory Visit: Payer: BC Managed Care – PPO

## 2015-03-21 ENCOUNTER — Ambulatory Visit: Payer: BC Managed Care – PPO | Admitting: Physical Therapy

## 2015-03-21 DIAGNOSIS — IMO0002 Reserved for concepts with insufficient information to code with codable children: Secondary | ICD-10-CM

## 2015-03-21 DIAGNOSIS — R41841 Cognitive communication deficit: Secondary | ICD-10-CM

## 2015-03-21 DIAGNOSIS — R1313 Dysphagia, pharyngeal phase: Secondary | ICD-10-CM

## 2015-03-21 DIAGNOSIS — R269 Unspecified abnormalities of gait and mobility: Secondary | ICD-10-CM | POA: Diagnosis not present

## 2015-03-21 DIAGNOSIS — M6281 Muscle weakness (generalized): Secondary | ICD-10-CM

## 2015-03-21 NOTE — Therapy (Signed)
[facility] [address] [place], [place], [address] Phone: [phone]   Fax:  [phone]  Speech Language Pathology Treatment  Patient Details  Name: Russell Austin MRN: [id number] Date of Birth: [birth year] Referring Provider:  Dorena Cookey, MD  Encounter Date: 03/21/2015      End of Session - 03/21/15 0935    Visit Number 3   Number of Visits 16   Date for SLP Re-Evaluation 05/10/15   SLP Start Time 0849   SLP Stop Time  0931   SLP Time Calculation (min) 42 min   Activity Tolerance Patient tolerated treatment well      Past Medical History  Diagnosis Date  . Diabetes mellitus type II   . Hypertension   . ED (erectile dysfunction)   . Diabetic retinopathy FOLLOWED BY DR Zadie Rhine  . 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 Glaucoma of both eyes   . Left hydrocele     Past Surgical History  Procedure Laterality Date  . Undescended right testicle removed  1994  . Right eye vitrectomy/ insertion glaucoma seton/ laser repair  12-13-2008    RETINAL ARTERY OCCLUSION /NEOVASCULAR GLAUCOMA/ HEMORRHAGE  . Right eye vitretomy/ insertion glaucoma seton x2/ laser  03-24-2009    RECURRENT HEMORRHAGE/ OCCLUSION INTERNAL SETON  . Left eye laser retina repair  SEPT 2012  . Appendectomy  AGE EARLY 20'S  . Hydrocele excision  03/31/2012    Procedure: HYDROCELECTOMY ADULT;  Surgeon: Franchot Gallo, MD;  Location: [facility];  Service: Urology;  Laterality: Left;  45 MINS      There were no vitals filed for this visit.  Visit Diagnosis: Pharyngeal dysphagia  Cognitive communication deficit      Subjective Assessment - 03/21/15 0854    Subjective Pt brought his doc from the hospital today - swallowing precautions, not exercises.               ADULT SLP TREATMENT - 03/21/15 0855    General Information   Behavior/Cognition Pleasant mood;Cooperative;Alert   Treatment Provided   Treatment provided Dysphagia   Dysphagia Treatment   Treatment Methods Skilled observation;Therapeutic exercise;Patient/caregiver education   Patient observed directly with PO's Yes   Type of PO's observed Ice chips   Oral Phase Signs & Symptoms --  none   Pharyngeal Phase Signs & Symptoms --  none    Other treatment/comments SLPwent through swallowing exercises with pt and he return demo'd each one to SLP satisfaction. SLP explained reason/rationale for each one, with swallowing anatomy PRN. PT without overt s/s aspiration today.    Pain Assessment   Pain Assessment No/denies pain   Assessment / Recommendations / Plan   Plan Continue with current plan of care   Dysphagia Recommendations   Diet recommendations Dysphagia 2 (fine chop);Nectar-thick liquid  water protocol   Compensations Small sips/bites;Slow rate;Multiple dry swallows after each bite/sip;Follow solids with liquid;Clear throat intermittently   Progression Toward Goals   Progression toward goals Progressing toward goals          SLP Education - 03/21/15 0934    Education provided Yes   Education Details HEP   Person(s) Educated Patient   Methods Explanation;Demonstration;Verbal cues   Comprehension Verbalized understanding;Returned demonstration;Verbal cues required          SLP Short Term Goals - 03/21/15 0935    SLP SHORT TERM GOAL #1   Title pt will complete swallowing HEP with occasional min A  Time 3   Period Weeks   Status On-going   SLP SHORT TERM GOAL #2   Title pt will demo awareness of safe swallow techniques by using them with POs with rare min A over three sessions   Time 3   Period Weeks   Status On-going   SLP SHORT TERM GOAL #3   Title pt will tell SLP three s/s aspiration PNA   Time 3   Period Weeks   Status On-going          SLP Long Term Goals - 03/21/15 0935    SLP LONG TERM GOAL #1   Title pt will complete HEP with modified independence   Time 7    Period Weeks   Status On-going   SLP LONG TERM GOAL #2   Title pt will demo understanding of consequences of not following recommendations with water protocol   Time 7   Period Weeks   Status On-going   SLP LONG TERM GOAL #3   Title pt will demo safe swallow strategies with POs, during ST session, over 4 total visits   Time 7   Period Weeks   Status On-going          Plan - 03/21/15 0935    Clinical Impression Statement Pt with obvious difficulty with swallowing muscle strength today in therapy. Skilled ST needed to cont in order to assess success with procedure for HEP. SLP believes his cognitive-communicaiton deficit has likely resolved. SLP to monitor.   Speech Therapy Frequency 2x / week   Duration --  7 weeks   Treatment/Interventions Aspiration precaution training;Pharyngeal strengthening exercises;Oral motor exercises;Diet toleration management by SLP;Trials of upgraded texture/liquids;SLP instruction and feedback   Potential to Achieve Goals Good        Problem List Patient Active Problem List   Diagnosis Date Noted  . Dysphagia S/P CVA (cerebrovascular accident) 03/14/2015  . Dysphagia following cerebral infarction 02/15/2015  . Cerebral infarction due to thrombosis of basilar artery   . CKD stage 2 due to type 2 diabetes mellitus 02/12/2015  . Right hemiparesis   . CVA (cerebral infarction) 02/10/2015  . CKD (chronic kidney disease) 02/10/2015  . Type 2 diabetes mellitus with diabetic neuropathy 02/10/2015  . Neck pain   . Numbness and tingling 02/09/2015  . Back pain, acute 10/30/2012  . Hematuria of undiagnosed cause 10/30/2012  . Proteinuria 10/30/2012  . Left shoulder pain 07/17/2012  . Plantar fasciitis, bilateral 07/17/2012  . HEADACHE 01/07/2009  . Peripheral neuropathy 11/06/2007  . ERECTILE DYSFUNCTION, MILD 11/06/2007  . 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 Essential hypertension 07/08/2007  . EXTRINSIC  ASTHMA, UNSPECIFIED 06/25/2007    [facility] , MS, CCC-SLP  03/21/2015, 9:37 AM  [facility] [address] [place], [place], [address] Phone: [phone]   Fax:  [phone]

## 2015-03-21 NOTE — Patient Instructions (Signed)
SWALLOWING EXERCISES   1. Effortful Swallows - Squeeze hard with the muscles in your neck while you swallow your  saliva or a sip of water - Repeat 20 times, 2-3 times a day, and use whenever you eat or drink  2. Masako Swallow - swallow with your tongue sticking out - Stick tongue out past your teeth and gently bite tongue with your teeth - Swallow, while holding your tongue with your teeth - Repeat 20 times, 2-3 times a day *use a wet spoon if your mouth gets dry*  3. Mendelsohn Maneuver - "half swallow" exercise - Start to swallow, and keep your Adam's apple up by squeezing hard with the muscles of the throat - Hold the squeeze for 5-7 seconds and then relax - Repeat 20 times, 2-3 times a day *use a wet spoon if your mouth gets dry*  4. Breath Hold (the "Lorinda Creed" exercise) - Say "HUH!" loudly 2 to 3 times each side, - Repeat 10 times, 2-3 times a day  5. Chin pushback - Open your mouth  - Place your fist UNDER your chin near your neck, and push back with your fist for 5 seconds - Repeat 10 times, 2-3 times a day  6. Towel hold  -hold a towel under your chin  -press down hard, 30 times  -do this twice, 2-3 times a day

## 2015-03-21 NOTE — Therapy (Signed)
[facility] [address] [place], [place], [address] Phone: [phone]   Fax:  [phone]  Physical Therapy Treatment  Patient Details  Name: Russell Austin MRN: [id number] Date of Birth: 11-18-1950 Referring Provider:  Dorena Cookey, MD  Encounter Date: 03/21/2015      PT End of Session - 03/21/15 0809    Visit Number 4   Number of Visits 16   Date for PT Re-Evaluation 05/08/15   Authorization Type check benefits with insurance specialist to clarify*   PT Start Time 0805   PT Stop Time 0845   PT Time Calculation (min) 40 min   Equipment Utilized During Treatment Gait belt   Activity Tolerance Patient tolerated treatment well   Behavior During Therapy [facility] for tasks assessed/performed      Past Medical History  Diagnosis Date  . Diabetes mellitus type II   . Hypertension   . ED (erectile dysfunction)   . Diabetic retinopathy FOLLOWED BY DR Zadie Rhine  . 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 Glaucoma of both eyes   . Left hydrocele     Past Surgical History  Procedure Laterality Date  . Undescended right testicle removed  1994  . Right eye vitrectomy/ insertion glaucoma seton/ laser repair  12-13-2008    RETINAL ARTERY OCCLUSION /NEOVASCULAR GLAUCOMA/ HEMORRHAGE  . Right eye vitretomy/ insertion glaucoma seton x2/ laser  03-24-2009    RECURRENT HEMORRHAGE/ OCCLUSION INTERNAL SETON  . Left eye laser retina repair  SEPT 2012  . Appendectomy  AGE EARLY 20'S  . Hydrocele excision  03/31/2012    Procedure: HYDROCELECTOMY ADULT;  Surgeon: Franchot Gallo, MD;  Location: [facility];  Service: Urology;  Laterality: Left;  45 MINS      There were no vitals filed for this visit.  Visit Diagnosis:  Generalized muscle weakness  Lack of coordination due to stroke  Abnormality of gait      Subjective Assessment - 03/21/15 0809    Subjective No new complaints. No  falls or pain to report. HEP going well.    Currently in Pain? No/denies   Pain Score 0-No pain     Treatment: Gait: 230 feet x 1 with straight cane, 165 feet x 1. Min to mod assist. Cues on posture, step length and to increase base of support to decrease scissoring with gait. Pt with decreased right hip/knee flexion and decreased foot clearance/DF with gait. Loss of balance x 3 episodes due to right toe scuffing needing up to mod assist to regain balance.  Neuro re-ed: Single leg stance activities: Alternating forward toe taps and cross toe taps x 10 each bil legs to both 4 inch box and 6 inch box with min assist. Cues on posture and weight shift for stance stability.  At counter top: Side stepping with right UE support only x 3 laps each way with cues on posture and to lift feet, not slide them, with advancement High knee marching forward/backwards with left UE support x 3 laps each way with cues on posture and ex form/technique Toe walking forward/backwards with left UE support x 3 laps each way with cues on posture and ex form/technique  Exercise: With UE support on back of chair Heel raises 5 sec hold x 10 reps Toe raises 5 sec hold x 10 reps Mini squats x 10 reps with right UE support only to decrease UE weight bearing with exercise Seated with right foot propped for hamstring  stretch concurrent with ankle exercises- yellow thera band resisted DF x 10 reps At stairs with left UE support on rail:           PT Short Term Goals - 03/17/15 1220    PT SHORT TERM GOAL #1   Title The patient will verbalize understanding of stroke risk factors and warning signs.   Baseline Target date 04/08/2015   Time 4   Period Weeks   Status On-going   PT SHORT TERM GOAL #2   Title The patient will improve Berg score from 30/56 up to 38/56 to demo decreased risk for falls.   Baseline Target date 04/08/2015   Time 4   Period Weeks   Status On-going   PT SHORT TERM GOAL #3   Title The patient  will have gait further assessed and goal to follow (held today due to elvated BP).   Baseline Met on 03/16/2015 and new goal below.   Time 4   Period Weeks   Status Achieved   PT SHORT TERM GOAL #4   Title The patient will be able to negotiate his steps at home without sitting down to descend steps per report.   Baseline Target date 04/08/2015   Time 4   Period Weeks   Status On-going   PT SHORT TERM GOAL #5   Title The patient will transfer sit<>stand and stand pivot modified indep.   Baseline Target date 04/08/2015   Time 4   Period Weeks   Status On-going   Additional Short Term Goals   Additional Short Term Goals Yes   PT SHORT TERM GOAL #6   Title The patient will improve gait speed from 1.71 ft/sec up to 2.1 ft/sec to demo improving mobility.   Baseline Target date 04/08/2015   Time 4   Period Weeks   Status New   PT SHORT TERM GOAL #7   Title The patient will ambulate for household distances x 150 ft with CGA with least restrictive assistive device (baseline min A with SBQC).   Baseline Target date 04/08/2015   Time 4   Period Weeks   Status New           PT Long Term Goals - 03/17/15 1223    PT LONG TERM GOAL #1   Title The patient will be indep with HEP.   Baseline Target date 05/08/2015   Time 8   Period Weeks   Status On-going   PT LONG TERM GOAL #2   Title The patient will improve Berg from 30/56 up to > or equal to 42/56.   Baseline Target date 05/08/2015   Time 8   Period Weeks   Status On-going   PT LONG TERM GOAL #3   Title Ambulate x 300 ft with least restrictive assistive device and supervision for improved functional mobility in the home and limited community distances.   Baseline Target date 05/08/2015   Time 8   Period Weeks   Status New   PT LONG TERM GOAL #4   Title The patient will improve stair negotiate x 4 steps to supervision with step to pattern and one handrails.   Baseline Target date 05/08/2015   Time 8   Period Weeks   Status New   PT LONG  TERM GOAL #5   Title Improve SIS Mobility by 20% (baseline 41.7%).   Baseline Target date 05/08/2015   Time 8   Period Weeks   Status On-going   Additional Long Term Goals  Additional Long Term Goals Yes   PT LONG TERM GOAL #6   Title Improve gait speed from 1.71 ft/sec to > or equal to 2.4 ft/sec for improved functional ambulation.   Baseline Target date 05/08/2015   Time 8   Period Weeks   Status New   PT LONG TERM GOAL #7   Title The patient will decrease TUG from 19.63 seconds to < or equal to 15 seconds to demo improved functional mobility.   Baseline Target date 05/08/2015   Time 8   Period Weeks   Status New            Plan - 03/21/15 0809    Clinical Impression Statement Pt continues to be challenged with standing balance activities and demo decreased right foot clearance with gait. Multiple episodes of pt's right foot catching with gait, causing loss of balance needing assist to correct. Pt is making steady progress toward goals.   Pt will benefit from skilled therapeutic intervention in order to improve on the following deficits Abnormal gait;Decreased activity tolerance;Decreased balance;Difficulty walking;Postural dysfunction;Impaired sensation;Decreased mobility;Decreased strength   Rehab Potential Good   Clinical Impairments Affecting Rehab Potential HTN   PT Frequency 2x / week   PT Duration 8 weeks   PT Treatment/Interventions ADLs/Self Care Home Management;Gait training;Neuromuscular re-education;Balance training;Therapeutic exercise;Therapeutic activities;Functional mobility training;Stair training;Patient/family education;Manual techniques   PT Next Visit Plan continue to work on strengthening,  balance and gait   PT Home Exercise Plan LE strengthening, stretching, general mobility   Consulted and Agree with Plan of Care Patient        Problem List Patient Active Problem List   Diagnosis Date Noted  . Dysphagia S/P CVA (cerebrovascular accident) 03/14/2015   . Dysphagia following cerebral infarction 02/15/2015  . Cerebral infarction due to thrombosis of basilar artery   . CKD stage 2 due to type 2 diabetes mellitus 02/12/2015  . Right hemiparesis   . CVA (cerebral infarction) 02/10/2015  . CKD (chronic kidney disease) 02/10/2015  . Type 2 diabetes mellitus with diabetic neuropathy 02/10/2015  . Neck pain   . Numbness and tingling 02/09/2015  . Back pain, acute 10/30/2012  . Hematuria of undiagnosed cause 10/30/2012  . Proteinuria 10/30/2012  . Left shoulder pain 07/17/2012  . Plantar fasciitis, bilateral 07/17/2012  . HEADACHE 01/07/2009  . Peripheral neuropathy 11/06/2007  . ERECTILE DYSFUNCTION, MILD 11/06/2007  . 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 Essential hypertension 07/08/2007  . EXTRINSIC ASTHMA, UNSPECIFIED 06/25/2007    Willow Ora 03/21/2015, 9:40 AM  Willow Ora, PTA, [facility] [address] [place], [place] [address] [phone] 03/21/2015, 9:40 AM

## 2015-03-23 ENCOUNTER — Ambulatory Visit: Payer: BC Managed Care – PPO

## 2015-03-23 ENCOUNTER — Ambulatory Visit: Payer: BC Managed Care – PPO | Admitting: Rehabilitative and Restorative Service Providers"

## 2015-03-23 ENCOUNTER — Ambulatory Visit: Payer: BC Managed Care – PPO | Admitting: Occupational Therapy

## 2015-03-23 VITALS — BP 162/78

## 2015-03-23 VITALS — BP 161/84 | HR 72

## 2015-03-23 DIAGNOSIS — I69359 Hemiplegia and hemiparesis following cerebral infarction affecting unspecified side: Secondary | ICD-10-CM

## 2015-03-23 DIAGNOSIS — IMO0002 Reserved for concepts with insufficient information to code with codable children: Secondary | ICD-10-CM

## 2015-03-23 DIAGNOSIS — M6281 Muscle weakness (generalized): Secondary | ICD-10-CM

## 2015-03-23 DIAGNOSIS — R269 Unspecified abnormalities of gait and mobility: Secondary | ICD-10-CM

## 2015-03-23 DIAGNOSIS — R609 Edema, unspecified: Secondary | ICD-10-CM

## 2015-03-23 DIAGNOSIS — R1313 Dysphagia, pharyngeal phase: Secondary | ICD-10-CM

## 2015-03-23 NOTE — Therapy (Signed)
[facility] [address] [place], [place], [address] Phone: [phone]   Fax:  [phone]  Occupational Therapy Treatment  Patient Details  Name: Russell Austin MRN: [id number] Date of Birth: 09-Sep-1950 Referring Provider:  Dorena Cookey, MD  Encounter Date: 03/23/2015      OT End of Session - 03/23/15 1407    Visit Number 3   Number of Visits 17   Date for OT Re-Evaluation 05/10/15   Authorization Type [employer] PPO   OT Start Time 0930   OT Stop Time 1015   OT Time Calculation (min) 45 min   Equipment Utilized During Treatment gait belt, BP cuff   Activity Tolerance Patient tolerated treatment well      Past Medical History  Diagnosis Date  . Diabetes mellitus type II   . Hypertension   . ED (erectile dysfunction)   . Diabetic retinopathy FOLLOWED BY DR Zadie Rhine  . 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 Glaucoma of both eyes   . Left hydrocele     Past Surgical History  Procedure Laterality Date  . Undescended right testicle removed  1994  . Right eye vitrectomy/ insertion glaucoma seton/ laser repair  12-13-2008    RETINAL ARTERY OCCLUSION /NEOVASCULAR GLAUCOMA/ HEMORRHAGE  . Right eye vitretomy/ insertion glaucoma seton x2/ laser  03-24-2009    RECURRENT HEMORRHAGE/ OCCLUSION INTERNAL SETON  . Left eye laser retina repair  SEPT 2012  . Appendectomy  AGE EARLY 20'S  . Hydrocele excision  03/31/2012    Procedure: HYDROCELECTOMY ADULT;  Surgeon: Franchot Gallo, MD;  Location: [facility];  Service: Urology;  Laterality: Left;  45 MINS      Filed Vitals:   03/23/15 1008  BP: 162/78    Visit Diagnosis:  Generalized muscle weakness  Lack of coordination due to stroke  Hemiparesis affecting nondominant side as late effect of cerebrovascular accident  Edema      Subjective Assessment - 03/23/15 0935    Subjective  It's doing a little better (re: RUE)   Pertinent History cerebral infarct 02/09/15, diabetic retinopathy with Rt eye legally blind, DM   Patient Stated Goals to return to cooking, cleaning and use of RT arm   Currently in Pain? No/denies                      OT Treatments/Exercises (OP) - 03/23/15 0001    Exercises   Exercises --  Reviewed HEP/edema mgnmt from last session   Fine Motor Coordination   Fine Motor Coordination Flipping cards   Flipping cards for supination and finger extension with min difficulty   Neurological Re-education Exercises   Other Exercises 1 AA/ROM RUE high range with UE Ranger while seated and min cues for positioning. Progressed to AA/ROM BUE's with physioball in high range sh. flexion along wall in standing with min to mod compensations RUE into abduction   Other Exercises 2 Modified push ups at wall in standing with min assist for balance (using gait belt) and support RUE.    Functional Reaching Activities   Mid Level To place checkers into Connect 4 slots midrange with initial cues to prevent sh. compensations (Pt completed 4 rows) - for coordination, RUE open chain reaching, and functional use                  OT Short Term Goals - 03/23/15 1409    OT SHORT TERM GOAL #1  Title Independent w/ initial HEP for RUE (DUE 04/09/15)   Time 4   Period Weeks   Status Achieved   OT SHORT TERM GOAL #2   Title Independent w/ edema management strategies for Rt hand   Time 4   Period Weeks   Status Achieved   OT SHORT TERM GOAL #3   Title Improve RUE functional use as demo by performing 22 or > blocks on Box & Blocks test   Baseline 15   Time 4   Period Weeks   Status On-going   OT SHORT TERM GOAL #4   Title Pt to increase grip strength to 35 lbs or greater Rt hand to assist with opening containers   Baseline 22 lbs   Time 4   Period Weeks   Status On-going   OT SHORT TERM GOAL #5   Title Pt to demo 125* shoulder flexion to retrieve/replace lightweight object on high  shelf with min compensations and no pain   Baseline 110-120* with moderate compensations into IR and forearm pronation, occasional pain from spasms   Time 4   Period Weeks   Status On-going           OT Long Term Goals - 03/10/15 1034    OT LONG TERM GOAL #1   Title Independent w/ updated HEP prn (due 05/10/15)   Time 8   Period Weeks   Status New   OT LONG TERM GOAL #2   Title Improve coordination Rt hand as evidenced by performing 9 hole peg test in under 2 minutes   Baseline eval = unable   Time 8   Period Weeks   Status New   OT LONG TERM GOAL #3   Title Improve RUE function as evidenced by performing 30 or > blocks on Box & Blocks test   Baseline eval = 15   Time 8   Period Weeks   Status New   OT LONG TERM GOAL #4   Title Pt to perform simple stovetop cooking task at mod I level safely   Baseline dependent   Time 8   Period Weeks   Status New   OT LONG TERM GOAL #5   Title Pt to perform light household cleaning tasks at mod I level safely   Baseline dependent    Time 8   Period Weeks   Status New               Plan - 03/23/15 1408    Clinical Impression Statement Pt met STG's #1 and #2. Pt with increased control RUE and increased coordination Rt hand.    Clinical Impairments Affecting Rehab Potential HTN   Plan continue NMR, RUE functional reaching and coordination, UBE   OT Home Exercise Plan CVA warning signs and risk factors, edema management techniques, initial HEP 03/16/15.    Consulted and Agree with Plan of Care Patient        Problem List Patient Active Problem List   Diagnosis Date Noted  . Dysphagia S/P CVA (cerebrovascular accident) 03/14/2015  . Dysphagia following cerebral infarction 02/15/2015  . Cerebral infarction due to thrombosis of basilar artery   . CKD stage 2 due to type 2 diabetes mellitus 02/12/2015  . Right hemiparesis   . CVA (cerebral infarction) 02/10/2015  . CKD (chronic kidney disease) 02/10/2015  . Type 2  diabetes mellitus with diabetic neuropathy 02/10/2015  . Neck pain   . Numbness and tingling 02/09/2015  . Back pain, acute 10/30/2012  .  Hematuria of undiagnosed cause 10/30/2012  . Proteinuria 10/30/2012  . Left shoulder pain 07/17/2012  . Plantar fasciitis, bilateral 07/17/2012  . HEADACHE 01/07/2009  . Peripheral neuropathy 11/06/2007  . ERECTILE DYSFUNCTION, MILD 11/06/2007  . 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 Essential hypertension 07/08/2007  . EXTRINSIC ASTHMA, UNSPECIFIED 06/25/2007    Carey Bullocks, OTR/L  03/23/2015, 2:11 PM  [facility] [address] [place], [place], [address] Phone: [phone]   Fax:  [phone]

## 2015-03-23 NOTE — Therapy (Signed)
[facility] [address] [place], [place], [address] Phone: [phone]   Fax:  [phone]  Speech Language Pathology Treatment  Patient Details  Name: Russell Austin MRN: [id number] Date of Birth: 1951-06-14 Referring Provider:  Dorena Cookey, MD  Encounter Date: 03/23/2015      End of Session - 03/23/15 1052    Visit Number 4   Number of Visits 16   Date for SLP Re-Evaluation 05/10/15   SLP Start Time 40   SLP Stop Time  1050   SLP Time Calculation (min) 32 min   Activity Tolerance Patient tolerated treatment well      Past Medical History  Diagnosis Date  . Diabetes mellitus type II   . Hypertension   . ED (erectile dysfunction)   . Diabetic retinopathy FOLLOWED BY DR Zadie Rhine  . 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 Glaucoma of both eyes   . Left hydrocele     Past Surgical History  Procedure Laterality Date  . Undescended right testicle removed  1994  . Right eye vitrectomy/ insertion glaucoma seton/ laser repair  12-13-2008    RETINAL ARTERY OCCLUSION /NEOVASCULAR GLAUCOMA/ HEMORRHAGE  . Right eye vitretomy/ insertion glaucoma seton x2/ laser  03-24-2009    RECURRENT HEMORRHAGE/ OCCLUSION INTERNAL SETON  . Left eye laser retina repair  SEPT 2012  . Appendectomy  AGE EARLY 20'S  . Hydrocele excision  03/31/2012    Procedure: HYDROCELECTOMY ADULT;  Surgeon: Franchot Gallo, MD;  Location: [facility];  Service: Urology;  Laterality: Left;  45 MINS      There were no vitals filed for this visit.  Visit Diagnosis: Pharyngeal dysphagia      Subjective Assessment - 03/23/15 1022    Subjective Pt is eating mostly regular diet with thin liquids - no longer nectar-thick liquids   Patient is accompained by: --               ADULT SLP TREATMENT - 03/23/15 1025    General Information   Behavior/Cognition Pleasant mood;Cooperative;Alert   Treatment Provided   Treatment provided Dysphagia   Dysphagia Treatment   Temperature Spikes Noted No   Treatment Methods Skilled observation;Therapeutic exercise   Patient observed directly with PO's Yes   Type of PO's observed Dysphagia 3 (soft);Thin liquids   Liquids provided via Cup   Oral Phase Signs & Symptoms --  none noted   Pharyngeal Phase Signs & Symptoms --  none noted   Other treatment/comments SLP inbasketed Dr. Letta Pate on Monday regarding follow up modified barium swallow (MBSS).  SLP used skilled observation to assess pt's success with dys III diet and thin liquids - no overt s/s aspiration noted today. Pt, upon rec from CIR SLP needs MBSS to confirm upgrade in liquids. Pt reports no difficulty in pharyngeal clearance with bacon.  SLP guided pt through his dysphagia HEP today with occasional min verbal cues. Pt told SLP 2 signs aspiration PNA.   Pain Assessment   Pain Assessment No/denies pain   Assessment / Recommendations / Plan   Plan Continue with current plan of care   Dysphagia Recommendations   Diet recommendations Regular;Nectar-thick liquid  (nectar recommended until MBSS)   Compensations Slow rate;Small sips/bites;Multiple dry swallows after each bite/sip;Follow solids with liquid;Clear throat intermittently   Progression Toward Goals   Progression toward goals Progressing toward goals            SLP Short Term Goals -  03/23/15 1043    SLP SHORT TERM GOAL #1   Title pt will complete swallowing HEP with occasional min A   Status Achieved   SLP SHORT TERM GOAL #2   Title pt will demo awareness of safe swallow techniques by using them with POs with rare min A over three sessions   Time 3   Period Weeks   Status On-going   SLP SHORT TERM GOAL #3   Title pt will tell SLP three s/s aspiration PNA   Time 3   Period Weeks   Status On-going          SLP Long Term Goals - 03/21/15 0935    SLP LONG TERM GOAL #1   Title pt will complete HEP with  modified independence   Time 7   Period Weeks   Status On-going   SLP LONG TERM GOAL #2   Title pt will demo understanding of consequences of not following recommendations with water protocol   Time 7   Period Weeks   Status On-going   SLP LONG TERM GOAL #3   Title pt will demo safe swallow strategies with POs, during ST session, over 4 total visits   Time 7   Period Weeks   Status On-going          Plan - 03/23/15 1052    Clinical Impression Statement Pt ipmroving with independence with HEP, no overt s/s aspiration with POs today. MBSS pending upon Dr. Letta Pate referral, if agreed, to formally upgrade to tihn liquids. If pt HEP looks good with little SLP involvement needed next week decr frequency to x1/week likely.   Speech Therapy Frequency 2x / week   Duration --  7 weeks   Treatment/Interventions Aspiration precaution training;Pharyngeal strengthening exercises;Oral motor exercises;Diet toleration management by SLP;Trials of upgraded texture/liquids;SLP instruction and feedback   Potential to Achieve Goals Good        Problem List Patient Active Problem List   Diagnosis Date Noted  . Dysphagia S/P CVA (cerebrovascular accident) 03/14/2015  . Dysphagia following cerebral infarction 02/15/2015  . Cerebral infarction due to thrombosis of basilar artery   . CKD stage 2 due to type 2 diabetes mellitus 02/12/2015  . Right hemiparesis   . CVA (cerebral infarction) 02/10/2015  . CKD (chronic kidney disease) 02/10/2015  . Type 2 diabetes mellitus with diabetic neuropathy 02/10/2015  . Neck pain   . Numbness and tingling 02/09/2015  . Back pain, acute 10/30/2012  . Hematuria of undiagnosed cause 10/30/2012  . Proteinuria 10/30/2012  . Left shoulder pain 07/17/2012  . Plantar fasciitis, bilateral 07/17/2012  . HEADACHE 01/07/2009  . Peripheral neuropathy 11/06/2007  . ERECTILE DYSFUNCTION, MILD 11/06/2007  . 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 Diabetes 1.5, managed as type 2  07/08/2007  . Essential hypertension 07/08/2007  . EXTRINSIC ASTHMA, UNSPECIFIED 06/25/2007    [facility] , MS, CCC-SLP  03/23/2015, 10:55 AM  [facility] [address] [place], [place], [address] Phone: [phone]   Fax:  [phone]

## 2015-03-23 NOTE — Therapy (Signed)
[facility] [address] [place], [place], [address] Phone: [phone]   Fax:  [phone]  Physical Therapy Treatment  Patient Details  Name: Russell Austin MRN: [id number] Date of Birth: 11-19-1950 Referring Provider:  Dorena Cookey, MD  Encounter Date: 03/23/2015      PT End of Session - 03/23/15 0940    Visit Number 5   Number of Visits 16   Date for PT Re-Evaluation 05/08/15   Authorization Type check benefits with insurance specialist to clarify*   PT Start Time 0845   PT Stop Time 0933   PT Time Calculation (min) 48 min   Equipment Utilized During Treatment Gait belt   Activity Tolerance Patient tolerated treatment well   Behavior During Therapy [facility] for tasks assessed/performed      Past Medical History  Diagnosis Date  . Diabetes mellitus type II   . Hypertension   . ED (erectile dysfunction)   . Diabetic retinopathy FOLLOWED BY DR Zadie Rhine  . 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 Glaucoma of both eyes   . Left hydrocele     Past Surgical History  Procedure Laterality Date  . Undescended right testicle removed  1994  . Right eye vitrectomy/ insertion glaucoma seton/ laser repair  12-13-2008    RETINAL ARTERY OCCLUSION /NEOVASCULAR GLAUCOMA/ HEMORRHAGE  . Right eye vitretomy/ insertion glaucoma seton x2/ laser  03-24-2009    RECURRENT HEMORRHAGE/ OCCLUSION INTERNAL SETON  . Left eye laser retina repair  SEPT 2012  . Appendectomy  AGE EARLY 20'S  . Hydrocele excision  03/31/2012    Procedure: HYDROCELECTOMY ADULT;  Surgeon: Franchot Gallo, MD;  Location: [facility];  Service: Urology;  Laterality: Left;  45 MINS      Filed Vitals:   03/23/15 0849  BP: 161/84  Pulse: 72    Visit Diagnosis:  Abnormality of gait  Lack of coordination due to stroke  Generalized muscle weakness       Subjective Assessment - 03/23/15 0846    Subjective The  patient is walking with a quad cane in his home environment with assist from family.  No falls since last session.   Currently in Pain? No/denies     Gait: Gait x 230 ft with SPC with R UE tactile cues for elbow extension and shoulder ER with wrist extension with CGA to occasional min A for loss of balance. Gait without device 40 ft x 2 reps with cues on R UE positioning, R toe off and scapular depression during gait Gait in "squat walk" position x 75 feet and then longer stride position x 60 feet with min A for safety  NEUROMUSCULAR RE-EDUCATION: Standing R weight shift with L LE on compliant surface with 1/2 sit>stands x 10 Moving L LE onto/off of compliant surface in anterior and lateral directions Stance on R leg with L foot on 12" step alternating between 12 and 18" surface with tactile cues for R hip position, R knee control with min A STance R leg with L LE abduction with R knee control and hip cues with min A and UE support Sit<>stand with R foot posteriorly positioned with L foot on compliant surface working on weight shift and terminal stance control Step downs from 6" step alternating R and L LEs with R UE support Seated core control stepping laterally and return to midline with R LE Seated hamstring stretch during a rest break      PT  Short Term Goals - 03/17/15 1220    PT SHORT TERM GOAL #1   Title The patient will verbalize understanding of stroke risk factors and warning signs.   Baseline Target date 04/08/2015   Time 4   Period Weeks   Status On-going   PT SHORT TERM GOAL #2   Title The patient will improve Berg score from 30/56 up to 38/56 to demo decreased risk for falls.   Baseline Target date 04/08/2015   Time 4   Period Weeks   Status On-going   PT SHORT TERM GOAL #3   Title The patient will have gait further assessed and goal to follow (held today due to elvated BP).   Baseline Met on 03/16/2015 and new goal below.   Time 4   Period Weeks   Status Achieved    PT SHORT TERM GOAL #4   Title The patient will be able to negotiate his steps at home without sitting down to descend steps per report.   Baseline Target date 04/08/2015   Time 4   Period Weeks   Status On-going   PT SHORT TERM GOAL #5   Title The patient will transfer sit<>stand and stand pivot modified indep.   Baseline Target date 04/08/2015   Time 4   Period Weeks   Status On-going   Additional Short Term Goals   Additional Short Term Goals Yes   PT SHORT TERM GOAL #6   Title The patient will improve gait speed from 1.71 ft/sec up to 2.1 ft/sec to demo improving mobility.   Baseline Target date 04/08/2015   Time 4   Period Weeks   Status New   PT SHORT TERM GOAL #7   Title The patient will ambulate for household distances x 150 ft with CGA with least restrictive assistive device (baseline min A with SBQC).   Baseline Target date 04/08/2015   Time 4   Period Weeks   Status New           PT Long Term Goals - 03/17/15 1223    PT LONG TERM GOAL #1   Title The patient will be indep with HEP.   Baseline Target date 05/08/2015   Time 8   Period Weeks   Status On-going   PT LONG TERM GOAL #2   Title The patient will improve Berg from 30/56 up to > or equal to 42/56.   Baseline Target date 05/08/2015   Time 8   Period Weeks   Status On-going   PT LONG TERM GOAL #3   Title Ambulate x 300 ft with least restrictive assistive device and supervision for improved functional mobility in the home and limited community distances.   Baseline Target date 05/08/2015   Time 8   Period Weeks   Status New   PT LONG TERM GOAL #4   Title The patient will improve stair negotiate x 4 steps to supervision with step to pattern and one handrails.   Baseline Target date 05/08/2015   Time 8   Period Weeks   Status New   PT LONG TERM GOAL #5   Title Improve SIS Mobility by 20% (baseline 41.7%).   Baseline Target date 05/08/2015   Time 8   Period Weeks   Status On-going   Additional Long Term Goals    Additional Long Term Goals Yes   PT LONG TERM GOAL #6   Title Improve gait speed from 1.71 ft/sec to > or equal to 2.4 ft/sec for improved  functional ambulation.   Baseline Target date 05/08/2015   Time 8   Period Weeks   Status New   PT LONG TERM GOAL #7   Title The patient will decrease TUG from 19.63 seconds to < or equal to 15 seconds to demo improved functional mobility.   Baseline Target date 05/08/2015   Time 8   Period Weeks   Status New               Plan - 03/23/15 9022    Clinical Impression Statement The patient reports a sensation of rocker foot on R side.  PT worked on terminal stance phase of gait encouragin hip stability, toe off with hip extension, and balance in stride position.   PT Next Visit Plan continue to work on strengthening,  balance and gait   PT Home Exercise Plan LE strengthening, stretching, general mobility   Consulted and Agree with Plan of Care Patient        Problem List Patient Active Problem List   Diagnosis Date Noted  . Dysphagia S/P CVA (cerebrovascular accident) 03/14/2015  . Dysphagia following cerebral infarction 02/15/2015  . Cerebral infarction due to thrombosis of basilar artery   . CKD stage 2 due to type 2 diabetes mellitus 02/12/2015  . Right hemiparesis   . CVA (cerebral infarction) 02/10/2015  . CKD (chronic kidney disease) 02/10/2015  . Type 2 diabetes mellitus with diabetic neuropathy 02/10/2015  . Neck pain   . Numbness and tingling 02/09/2015  . Back pain, acute 10/30/2012  . Hematuria of undiagnosed cause 10/30/2012  . Proteinuria 10/30/2012  . Left shoulder pain 07/17/2012  . Plantar fasciitis, bilateral 07/17/2012  . HEADACHE 01/07/2009  . Peripheral neuropathy 11/06/2007  . ERECTILE DYSFUNCTION, MILD 11/06/2007  . 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 Essential hypertension 07/08/2007  . EXTRINSIC ASTHMA, UNSPECIFIED 06/25/2007    Deyon Chizek, PT 03/23/2015,  9:44 AM  [facility] [address] [place], [place], [address] Phone: [phone]   Fax:  [phone]

## 2015-03-29 ENCOUNTER — Encounter: Payer: Self-pay | Admitting: Physical Medicine & Rehabilitation

## 2015-03-29 ENCOUNTER — Ambulatory Visit (HOSPITAL_BASED_OUTPATIENT_CLINIC_OR_DEPARTMENT_OTHER): Payer: BC Managed Care – PPO | Admitting: Physical Medicine & Rehabilitation

## 2015-03-29 ENCOUNTER — Encounter: Payer: BC Managed Care – PPO | Attending: Physical Medicine & Rehabilitation

## 2015-03-29 VITALS — BP 136/73 | HR 77 | Resp 14

## 2015-03-29 DIAGNOSIS — I10 Essential (primary) hypertension: Secondary | ICD-10-CM | POA: Diagnosis not present

## 2015-03-29 DIAGNOSIS — E11319 Type 2 diabetes mellitus with unspecified diabetic retinopathy without macular edema: Secondary | ICD-10-CM | POA: Insufficient documentation

## 2015-03-29 DIAGNOSIS — R2689 Other abnormalities of gait and mobility: Secondary | ICD-10-CM | POA: Insufficient documentation

## 2015-03-29 DIAGNOSIS — R200 Anesthesia of skin: Secondary | ICD-10-CM | POA: Insufficient documentation

## 2015-03-29 DIAGNOSIS — R4702 Dysphasia: Secondary | ICD-10-CM | POA: Diagnosis not present

## 2015-03-29 DIAGNOSIS — H409 Unspecified glaucoma: Secondary | ICD-10-CM | POA: Insufficient documentation

## 2015-03-29 DIAGNOSIS — I69391 Dysphagia following cerebral infarction: Secondary | ICD-10-CM

## 2015-03-29 MED ORDER — TIZANIDINE HCL 2 MG PO TABS: 2.0000 mg | ORAL_TABLET | Freq: Four times a day (QID) | ORAL | Status: DC

## 2015-03-29 NOTE — Patient Instructions (Signed)
No repeat swallow test needed

## 2015-03-29 NOTE — Progress Notes (Signed)
Subjective:    Patient ID: Russell Austin, male    DOB: [birth year], 64 y.o.   MRN: [id number] 64 y.o. male with history of DM type 2 with retinopathy, HTN, glaucoma, who was admitted on 02/09/15 with reports of heaviness of RUE/RLE with intermittent numbness, pins and needles since waking that am. He underwent colonoscopy that am and after procedure was noted to have difficulty walking as well as difficulty swallowing while in ED. 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 MBS done revealing severe oro-pharyngeal dysphagia therefore was made NPO and NGT was placed for nutritional support. Dr Erlinda Hong evaluated patient and recommended ASA for left medullary infarct most likely due to small vessel disease. On 06/11 am, patient had increase in right sided weakness due to evolutionary changes per MRI which Neurology felt was natural progression HPI Still having spasms at night , discussed increasing night time dose of tizanidine Swallowing improving SLP requests repeat MBS, Patient is on regular diet and full liquids. Constipation taking senna  Outpt PT, OT, SLP  Patient is now independent with dressing and bathing. He ambulates with a cane in the home as well as in therapy. For longer distances he still requires a wheelchair. Discussed various medications and dosages as well as looking at interactions that may cause visual changes.  Pain Inventory Average Pain 7 Pain Right Now 0 My pain is intermittent, aching and tension, tightness  In the last 24 hours, has pain interfered with the following? General activity 0 Relation with others 0 Enjoyment of life 0 What TIME of day is your pain at its worst? night Sleep (in general) Fair  Pain is worse with: inactivity Pain improves with: medication Relief from Meds: 1  Mobility walk with assistance use a cane ability to climb steps?  yes do you drive?   no use a wheelchair transfers alone Do you have any goals in this area?  yes  Function not employed: date last employed . disabled: date disabled . I need assistance with the following:  meal prep and shopping  Neuro/Psych trouble walking spasms  Prior Studies hospital f/u  Physicians involved in your care hospital f/u   Family History  Problem Relation Age of Onset  . Glaucoma Mother    History   Social History  . Marital Status: Married    Spouse Name: N/A  . Number of Children: N/A  . Years of Education: N/A   Social History Main Topics  . Smoking status: Never Smoker   . Smokeless tobacco: Never Used  . Alcohol Use: No  . Drug Use: No  . Sexual Activity: Not on file   Other Topics Concern  . None   Social History Narrative   Past Surgical History  Procedure Laterality Date  . Undescended right testicle removed  1994  . Right eye vitrectomy/ insertion glaucoma seton/ laser repair  12-13-2008    RETINAL ARTERY OCCLUSION /NEOVASCULAR GLAUCOMA/ HEMORRHAGE  . Right eye vitretomy/ insertion glaucoma seton x2/ laser  03-24-2009    RECURRENT HEMORRHAGE/ OCCLUSION INTERNAL SETON  . Left eye laser retina repair  SEPT 2012  . Appendectomy  AGE EARLY 20'S  . Hydrocele excision  03/31/2012    Procedure: HYDROCELECTOMY ADULT;  Surgeon: Franchot Gallo, MD;  Location: [facility];  Service: Urology;  Laterality: Left;  38 MINS     Past Medical History  Diagnosis Date  . Diabetes mellitus type II   .  Hypertension   . ED (erectile dysfunction)   . Diabetic retinopathy FOLLOWED BY DR Zadie Rhine  . 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 Glaucoma of both eyes   . Left hydrocele    BP 136/73 mmHg  Pulse 77  Resp 14  SpO2 99%  Opioid Risk Score:   Fall Risk Score:  `1  Depression screen PHQ 2/9  Depression screen PHQ 2/9 03/29/2015  Decreased Interest 0  Down, Depressed, Hopeless 0  PHQ - 2 Score 0     Review of Systems   Cardiovascular: Positive for leg swelling.  Gastrointestinal: Positive for constipation.  Endocrine:       High blood sugar  Musculoskeletal: Positive for gait problem.  Neurological:       Spasms        Objective:   Physical Exam  Constitutional: He is oriented to person, place, and time. He appears well-developed and well-nourished.  HENT:  Head: Normocephalic and atraumatic.  Eyes: Conjunctivae are normal. Pupils are equal, round, and reactive to light.  Neck: Normal range of motion.  Cardiovascular: Normal rate, regular rhythm and normal heart sounds.   Pulmonary/Chest: Effort normal and breath sounds normal. No respiratory distress. He has no wheezes. He has no rales.  Abdominal: Soft. Bowel sounds are normal. He exhibits no distension. There is no tenderness.  Neurological: He is alert and oriented to person, place, and time.  Motor strength is 3/5 in the right deltoid, biceps, triceps, grip 4/5 and hip flexor and extensor ankle dorsiflexor  Sensation intact to light touch in the upper limb. He does have some paresthesia with upper limb stimulation.  Psychiatric: He has a normal mood and affect.  Nursing note and vitals reviewed.  Tightness with range of motion of the right shoulder but negative impingement       Assessment & Plan:  1.  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  2.  Dysphagia resolved eating all textures without difficulty, wife cuts meats because pt cannot due to HP.  No need for repeat swallow.  Pt unlikely to comply with any restrictions as he is taking a normal diet currently.

## 2015-03-30 ENCOUNTER — Ambulatory Visit: Payer: BC Managed Care – PPO | Admitting: Occupational Therapy

## 2015-03-30 ENCOUNTER — Ambulatory Visit: Payer: BC Managed Care – PPO | Admitting: Speech Pathology

## 2015-03-30 ENCOUNTER — Ambulatory Visit: Payer: BC Managed Care – PPO | Admitting: Rehabilitative and Restorative Service Providers"

## 2015-04-04 ENCOUNTER — Ambulatory Visit: Payer: BC Managed Care – PPO

## 2015-04-04 ENCOUNTER — Ambulatory Visit: Payer: BC Managed Care – PPO | Attending: Physical Medicine & Rehabilitation | Admitting: Occupational Therapy

## 2015-04-04 ENCOUNTER — Ambulatory Visit: Payer: BC Managed Care – PPO | Admitting: Rehabilitative and Restorative Service Providers"

## 2015-04-04 ENCOUNTER — Encounter: Payer: Self-pay | Admitting: Occupational Therapy

## 2015-04-04 DIAGNOSIS — M6281 Muscle weakness (generalized): Secondary | ICD-10-CM | POA: Diagnosis present

## 2015-04-04 DIAGNOSIS — R1313 Dysphagia, pharyngeal phase: Secondary | ICD-10-CM | POA: Diagnosis present

## 2015-04-04 DIAGNOSIS — R279 Unspecified lack of coordination: Secondary | ICD-10-CM | POA: Diagnosis present

## 2015-04-04 DIAGNOSIS — I69859 Hemiplegia and hemiparesis following other cerebrovascular disease affecting unspecified side: Secondary | ICD-10-CM | POA: Insufficient documentation

## 2015-04-04 DIAGNOSIS — I6980 Unspecified sequelae of other cerebrovascular disease: Secondary | ICD-10-CM | POA: Insufficient documentation

## 2015-04-04 DIAGNOSIS — R269 Unspecified abnormalities of gait and mobility: Secondary | ICD-10-CM | POA: Diagnosis present

## 2015-04-04 DIAGNOSIS — I69359 Hemiplegia and hemiparesis following cerebral infarction affecting unspecified side: Secondary | ICD-10-CM

## 2015-04-04 DIAGNOSIS — IMO0002 Reserved for concepts with insufficient information to code with codable children: Secondary | ICD-10-CM

## 2015-04-04 NOTE — Therapy (Signed)
[facility] [address] [place], [place], [address] Phone: [phone]   Fax:  [phone]  Occupational Therapy Treatment  Patient Details  Name: Russell Austin MRN: [id number] Date of Birth: [birth year] Referring Provider:  Dorena Cookey, MD  Encounter Date: 04/04/2015      OT End of Session - 04/04/15 0841    Visit Number 4   Number of Visits 17   Date for OT Re-Evaluation 05/10/15   Authorization Type [employer]BS PPO   OT Start Time 0803   OT Stop Time 0845   OT Time Calculation (min) 42 min   Activity Tolerance Patient tolerated treatment well      Past Medical History  Diagnosis Date  . Diabetes mellitus type II   . Hypertension   . ED (erectile dysfunction)   . Diabetic retinopathy FOLLOWED BY DR Zadie Rhine  . 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 Glaucoma of both eyes   . Left hydrocele     Past Surgical History  Procedure Laterality Date  . Undescended right testicle removed  1994  . Right eye vitrectomy/ insertion glaucoma seton/ laser repair  12-13-2008    RETINAL ARTERY OCCLUSION /NEOVASCULAR GLAUCOMA/ HEMORRHAGE  . Right eye vitretomy/ insertion glaucoma seton x2/ laser  03-24-2009    RECURRENT HEMORRHAGE/ OCCLUSION INTERNAL SETON  . Left eye laser retina repair  SEPT 2012  . Appendectomy  AGE EARLY 20'S  . Hydrocele excision  03/31/2012    Procedure: HYDROCELECTOMY ADULT;  Surgeon: Franchot Gallo, MD;  Location: [facility];  Service: Urology;  Laterality: Left;  45 MINS      There were no vitals filed for this visit.  Visit Diagnosis:  Lack of coordination due to stroke  Hemiparesis affecting nondominant side as late effect of cerebrovascular accident  Generalized muscle weakness      Subjective Assessment - 04/04/15 0807    Subjective  The tightness just won't leave, even with exercises   Pertinent History cerebral infarct 02/09/15, diabetic  retinopathy with Rt eye legally blind, DM   Patient Stated Goals to return to cooking, cleaning and use of RT arm   Currently in Pain? No/denies  only tightness                      OT Treatments/Exercises (OP) - 04/04/15 0001    Exercises   Exercises --  UBE x 8 min. Level 1 (for reciprocal mvmt pattern)   Fine Motor Coordination   Fine Motor Coordination Large Pegboard;Dealing card with thumb   Large Pegboard Adapted functional reaching task (below) to place large pegs in pegboard on tabletop surface with emerging in hand manipulation with mod drops/difficulty   Dealing card with thumb Emerging - pt able to do with max difficulty and mod drops. Pt instructed to practice at home   Other Fine Motor Exercises Manipulating highlighter in hand with mod difficulty and drops. Instructed to practice at home   Neurological Re-education Exercises   Other Exercises 1 AA/ROM RUE in high flexion with UE ranger. AA/ROM RUE with cane held vertically and Lt hand assisting for high stretch RUE.    Functional Reaching Activities   Mid Level Attempted midlevel reaching to place large pegs in pegboard on vertical surface for functional reaching and coordination, but pt unable secondary to keep dropping pegs from Rt hand (due to lack of coordination and sensation Rt hand)  OT Short Term Goals - 03/23/15 1409    OT SHORT TERM GOAL #1   Title Independent w/ initial HEP for RUE (DUE 04/09/15)   Time 4   Period Weeks   Status Achieved   OT SHORT TERM GOAL #2   Title Independent w/ edema management strategies for Rt hand   Time 4   Period Weeks   Status Achieved   OT SHORT TERM GOAL #3   Title Improve RUE functional use as demo by performing 22 or > blocks on Box & Blocks test   Baseline 15   Time 4   Period Weeks   Status On-going   OT SHORT TERM GOAL #4   Title Pt to increase grip strength to 35 lbs or greater Rt hand to assist with opening containers    Baseline 22 lbs   Time 4   Period Weeks   Status On-going   OT SHORT TERM GOAL #5   Title Pt to demo 125* shoulder flexion to retrieve/replace lightweight object on high shelf with min compensations and no pain   Baseline 110-120* with moderate compensations into IR and forearm pronation, occasional pain from spasms   Time 4   Period Weeks   Status On-going           OT Long Term Goals - 03/10/15 1034    OT LONG TERM GOAL #1   Title Independent w/ updated HEP prn (due 05/10/15)   Time 8   Period Weeks   Status New   OT LONG TERM GOAL #2   Title Improve coordination Rt hand as evidenced by performing 9 hole peg test in under 2 minutes   Baseline eval = unable   Time 8   Period Weeks   Status New   OT LONG TERM GOAL #3   Title Improve RUE function as evidenced by performing 30 or > blocks on Box & Blocks test   Baseline eval = 15   Time 8   Period Weeks   Status New   OT LONG TERM GOAL #4   Title Pt to perform simple stovetop cooking task at mod I level safely   Baseline dependent   Time 8   Period Weeks   Status New   OT LONG TERM GOAL #5   Title Pt to perform light household cleaning tasks at mod I level safely   Baseline dependent    Time 8   Period Weeks   Status New               Plan - 04/04/15 [id number]    Clinical Impression Statement Pt is progressing with emerging in hand manipulation. Pt continues to make progress RUE   Plan Continue NMR, coordination Rt hand, begin assessing remaining STG's   OT Home Exercise Plan CVA warning signs and risk factors, edema management techniques, initial HEP 03/16/15.    Consulted and Agree with Plan of Care Patient        Problem List Patient Active Problem List   Diagnosis Date Noted  . Dysphagia S/P CVA (cerebrovascular accident) 03/14/2015  . Dysphagia following cerebral infarction 02/15/2015  . Cerebral infarction due to thrombosis of basilar artery   . CKD stage 2 due to type 2 diabetes mellitus 02/12/2015   . Right hemiparesis   . CVA (cerebral infarction) 02/10/2015  . CKD (chronic kidney disease) 02/10/2015  . Type 2 diabetes mellitus with diabetic neuropathy 02/10/2015  . Neck pain   . Numbness and tingling 02/09/2015  .  Back pain, acute 10/30/2012  . Hematuria of undiagnosed cause 10/30/2012  . Proteinuria 10/30/2012  . Left shoulder pain 07/17/2012  . Plantar fasciitis, bilateral 07/17/2012  . HEADACHE 01/07/2009  . Peripheral neuropathy 11/06/2007  . ERECTILE DYSFUNCTION, MILD 11/06/2007  . 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 Essential hypertension 07/08/2007  . EXTRINSIC ASTHMA, UNSPECIFIED 06/25/2007    Carey Bullocks, OTR/L 04/04/2015, 8:44 AM  [facility] [address] [place], [place], [address] Phone: [phone]   Fax:  [phone]

## 2015-04-04 NOTE — Patient Instructions (Signed)
Hamstring Stretch, Seated (Strap, Two Chairs)   Sit with one leg extended onto facing chair. *NO STRAP. Lengthen spine. Sit up tall. Hold for _20-30 seconds. Repeat __2__ times each leg.  Copyright  VHI. All rights reserved.  Abdominal Lying   Lie down on your stomach with your right arm resting over the edge of the bed.  *NO PILLOW OR BOLSTER NEEDED* Hold for 5 minutes if you can tolerate it.  Copyright  VHI. All rights reserved.  Adductor Stretch, Sitting Star Stretch   Sit *can sit on the couch or bed with R leg out to the side*  Make your hips face forward. Hold _20-30__ seconds.  Repeat _2__ times per session. Do __2_ sessions per day.  Copyright  VHI. All rights reserved.  Heel Cord Stretch   Place one leg forward, bent, other leg behind and straight. Lean forward keeping back heel flat. Hold __20-30__ seconds while counting out loud. Repeat with other leg. Repeat __2__ times. Do __2__ sessions per day.  http://gt2.exer.us/511   Copyright  VHI. All rights reserved.

## 2015-04-04 NOTE — Therapy (Signed)
[facility] [address] [place], [place], [address] Phone: [phone]   Fax:  [phone]  Speech Language Pathology Treatment  Patient Details  Name: Russell Austin MRN: [id number] Date of Birth: 11-04-1950 Referring Provider:  Dorena Cookey, MD  Encounter Date: 04/04/2015      End of Session - 04/04/15 1017    Visit Number 5   Number of Visits 16   Date for SLP Re-Evaluation 05/10/15   SLP Start Time 0936   SLP Stop Time  0958   SLP Time Calculation (min) 22 min      Past Medical History  Diagnosis Date  . Diabetes mellitus type II   . Hypertension   . ED (erectile dysfunction)   . Diabetic retinopathy FOLLOWED BY DR Zadie Rhine  . 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 Glaucoma of both eyes   . Left hydrocele     Past Surgical History  Procedure Laterality Date  . Undescended right testicle removed  1994  . Right eye vitrectomy/ insertion glaucoma seton/ laser repair  12-13-2008    RETINAL ARTERY OCCLUSION /NEOVASCULAR GLAUCOMA/ HEMORRHAGE  . Right eye vitretomy/ insertion glaucoma seton x2/ laser  03-24-2009    RECURRENT HEMORRHAGE/ OCCLUSION INTERNAL SETON  . Left eye laser retina repair  SEPT 2012  . Appendectomy  AGE EARLY 20'S  . Hydrocele excision  03/31/2012    Procedure: HYDROCELECTOMY ADULT;  Surgeon: Franchot Gallo, MD;  Location: [facility];  Service: Urology;  Laterality: Left;  45 MINS      There were no vitals filed for this visit.  Visit Diagnosis: Pharyngeal dysphagia      Subjective Assessment - 04/04/15 1011    Subjective "I went to see Dr. Letta Pate and he said we weren't going to do another test for my eating." See Dr. Letta Pate note 03-29-15 for details.               ADULT SLP TREATMENT - 04/04/15 0942    General Information   Behavior/Cognition Pleasant mood;Cooperative;Alert   Treatment Provided   Treatment provided  Dysphagia   Dysphagia Treatment   Temperature Spikes Noted No   Treatment Methods Skilled observation;Upgraded PO texture trial   Patient observed directly with PO's Yes   Type of PO's observed Regular;Dysphagia 1 (puree);Thin liquids   Liquids provided via Cup   Oral Phase Signs & Symptoms --  none noted   Pharyngeal Phase Signs & Symptoms --  none noted   Other treatment/comments Pt had crackers, applesauce, and icewater without overt s/s aspiration. Pt cont without overt s/s aspiration PNA to date. Dr. Letta Pate does not think follow up is warranted at this time (see his note from 03-29-15 for details). Pt told SLP 4 s/s aspiration PNA and is appropropriate for d/c as swallowing has resolved. He used reasoning WNL to tell SLP what would happen if he didn't use precautions.   Pain Assessment   Pain Assessment No/denies pain   Assessment / Recommendations / Plan   Plan Discharge SLP treatment due to (comment)  swallowing appears safe at this time   Dysphagia Recommendations   Diet recommendations Regular;Thin liquid   Compensations Slow rate;Small sips/bites   Progression Toward Goals   Progression toward goals --  goals checked today            SLP Short Term Goals - 04/04/15 1019    SLP SHORT TERM GOAL #1   Title pt will complete  swallowing HEP with occasional min A   Status Achieved   SLP SHORT TERM GOAL #2   Title pt will demo awareness of safe swallow techniques by using them with POs with rare min A over three sessions   Time --   Period --   Status Deferred   SLP SHORT TERM GOAL #3   Title pt will tell SLP three s/s aspiration PNA   Status Achieved          SLP Long Term Goals - 04/04/15 1020    SLP LONG TERM GOAL #1   Title pt will complete HEP with modified independence   Time 7   Period Weeks   Status Deferred   SLP LONG TERM GOAL #2   Title pt will demo understanding of consequences of not following recommendations with water protocol   Time 7    Period Weeks   Status Deferred   SLP LONG TERM GOAL #3   Title pt will demo safe swallow strategies with POs, during ST session, over 4 total visits   Time 7   Period Weeks   Status Deferred          Plan - 04/04/15 1018    Clinical Impression Statement Discharge today as pt's swallowing appears to have resolved. Dr. Letta Pate does not think follow up MBSS is necessary at this time (see his note 03-29-15).    Treatment/Interventions Aspiration precaution training;Pharyngeal strengthening exercises;Oral motor exercises;Diet toleration management by SLP;Trials of upgraded texture/liquids;SLP instruction and feedback     SPEECH THERAPY DISCHARGE SUMMARY  Visits from Start of Care: 5  Current functional level related to goals / functional outcomes: Pt's swallowing appears to have returned to WNL, as evidenced today by no overt s/s aspiration with regular diet items and thin liquids. Pt has also not reported (nor any seen) s/s aspiration PNA to date.  Referring MD is comfortable at this time not having pt follow up with MBSS to formally diagnose positive change in swallow function (see note for 03-29-15). At this time, pt is appropriate for discharge for swallow therapy. Pt appears to have cognitive linguistic skills at WNL/WFL. Initial SLP thoughts on decr'd awareness due to pt not admitting swallowing deficit may well have been due to pt's return to WNL swallow function soon after d/c from hospital. Pt reasoned with what seemed to be WNL ability with SLP discussion with pt re: swallow precautions. Pt recalled s/s aspiration PNA from previous session/s without cues.   Remaining deficits: None, from swallowing standpoint.   Education / Equipment: Recommended diet, swallow precautions, swallow HEP  Plan: Patient agrees to discharge.  Patient goals were partially met. Patient is being discharged due to being pleased with the current functional level.  ????? and pt appearing to return to  premorbid/WNL swallowing ability.        Problem List Patient Active Problem List   Diagnosis Date Noted  . Dysphagia S/P CVA (cerebrovascular accident) 03/14/2015  . Dysphagia following cerebral infarction 02/15/2015  . Cerebral infarction due to thrombosis of basilar artery   . CKD stage 2 due to type 2 diabetes mellitus 02/12/2015  . Right hemiparesis   . CVA (cerebral infarction) 02/10/2015  . CKD (chronic kidney disease) 02/10/2015  . Type 2 diabetes mellitus with diabetic neuropathy 02/10/2015  . Neck pain   . Numbness and tingling 02/09/2015  . Back pain, acute 10/30/2012  . Hematuria of undiagnosed cause 10/30/2012  . Proteinuria 10/30/2012  . Left shoulder pain 07/17/2012  .  Plantar fasciitis, bilateral 07/17/2012  . HEADACHE 01/07/2009  . Peripheral neuropathy 11/06/2007  . ERECTILE DYSFUNCTION, MILD 11/06/2007  . 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 Essential hypertension 07/08/2007  . EXTRINSIC ASTHMA, UNSPECIFIED 06/25/2007    [facility] , MS, CCC-SLP  04/04/2015, 10:24 AM  [facility] [address] [place], [place], [address] Phone: [phone]   Fax:  [phone]

## 2015-04-04 NOTE — Therapy (Signed)
[facility] [address] [place], [place], [address] Phone: [phone]   Fax:  [phone]  Physical Therapy Treatment  Patient Details  Name: Russell Austin MRN: [id number] Date of Birth: August 29, 1951 Referring Provider:  Dorena Cookey, MD  Encounter Date: 04/04/2015      PT End of Session - 04/04/15 0937    Visit Number 6   Number of Visits 16   Date for PT Re-Evaluation 05/08/15   Authorization Type check benefits with insurance specialist to clarify*   PT Start Time 0847   PT Stop Time 0932   PT Time Calculation (min) 45 min   Equipment Utilized During Treatment Gait belt   Activity Tolerance Patient tolerated treatment well   Behavior During Therapy [facility] for tasks assessed/performed      Past Medical History  Diagnosis Date  . Diabetes mellitus type II   . Hypertension   . ED (erectile dysfunction)   . Diabetic retinopathy FOLLOWED BY DR Zadie Rhine  . 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 Glaucoma of both eyes   . Left hydrocele     Past Surgical History  Procedure Laterality Date  . Undescended right testicle removed  1994  . Right eye vitrectomy/ insertion glaucoma seton/ laser repair  12-13-2008    RETINAL ARTERY OCCLUSION /NEOVASCULAR GLAUCOMA/ HEMORRHAGE  . Right eye vitretomy/ insertion glaucoma seton x2/ laser  03-24-2009    RECURRENT HEMORRHAGE/ OCCLUSION INTERNAL SETON  . Left eye laser retina repair  SEPT 2012  . Appendectomy  AGE EARLY 20'S  . Hydrocele excision  03/31/2012    Procedure: HYDROCELECTOMY ADULT;  Surgeon: Franchot Gallo, MD;  Location: [facility];  Service: Urology;  Laterality: Left;  45 MINS     BP=152/82 after ending OT session  There were no vitals filed for this visit.  Visit Diagnosis:  Abnormality of gait  Generalized muscle weakness      Subjective Assessment - 04/04/15 0935    Subjective The patient is having muscle  spasms limiting sleep at night.  He is now sleeping in a lounge chair due to R sided tightness.   Patient Stated Goals Be able to move freely (walk, pick up things, drive); regain independence   Currently in Pain? No/denies    Gait: Ambulation without a device with R UE cues and CGA with occasional tactile weight shift cues through R hip and R scapula x 230 ft, 115 ft x 2 reps with cues on reciprocal nature of gait and R knee flexion at initial swing phase + R heel contact at initial contact of gait cycle.  Treadmill with bilateral UE support x 1.4 mph at 0% incline with min A + tactile cues R LE motor control during gait cycle x 5 minutes. PT appears to have spasticity limiting knee extension @ terminal swing of gait  THERAPEUTIC EXERCISE: STanding hamstring and heel cord stretch, as well as hip adductor stretch with bilateral UE support and SBA for safety Seated hamstring and hip adductor stretch for HEP Prone knee flexion strengthening R LE with tactile cues  NEUROMUSCULAR RE-EDUCATION: Worked on pre-gait activities of R push off through toes for end of stance phase Initial swing phase where R knee flexes moving R LE onto 6" support surface with CGA        PT Education - 04/04/15 0936    Education provided Yes   Education Details HEP: prone lying for hip flexor stretch, seated hamstring stretch,  hip adductor stretch, and standing heel cord stretch.   Person(s) Educated Patient   Methods Explanation;Demonstration;Verbal cues;Handout   Comprehension Verbalized understanding;Returned demonstration          PT Short Term Goals - 04/04/15 3662    PT SHORT TERM GOAL #1   Title The patient will verbalize understanding of stroke risk factors and warning signs.   Baseline Target date 04/08/2015   Time 4   Period Weeks   Status On-going   PT SHORT TERM GOAL #2   Title The patient will improve Berg score from 30/56 up to 38/56 to demo decreased risk for falls.   Baseline Target date  04/08/2015   Time 4   Period Weeks   Status On-going   PT SHORT TERM GOAL #3   Title The patient will have gait further assessed and goal to follow (held today due to elvated BP).   Baseline Met on 03/16/2015 and new goal below.   Time 4   Period Weeks   Status Achieved   PT SHORT TERM GOAL #4   Title The patient will be able to negotiate his steps at home without sitting down to descend steps per report.   Baseline Target date 04/08/2015   Time 4   Period Weeks   Status On-going   PT SHORT TERM GOAL #5   Title The patient will transfer sit<>stand and stand pivot modified indep.   Baseline Target date 04/08/2015 - met on 04/04/2015   Time 4   Period Weeks   Status Achieved   PT SHORT TERM GOAL #6   Title The patient will improve gait speed from 1.71 ft/sec up to 2.1 ft/sec to demo improving mobility.   Baseline Target date 04/08/2015   Time 4   Period Weeks   Status On-going   PT SHORT TERM GOAL #7   Title The patient will ambulate for household distances x 150 ft with CGA with least restrictive assistive device (baseline min A with SBQC).   Baseline Target date 04/08/2015   Time 4   Period Weeks   Status On-going           PT Long Term Goals - 04/04/15 0938    PT LONG TERM GOAL #1   Title The patient will be indep with HEP.   Baseline Target date 05/08/2015   Time 8   Period Weeks   Status On-going   PT LONG TERM GOAL #2   Title The patient will improve Berg from 30/56 up to > or equal to 42/56.   Baseline Target date 05/08/2015   Time 8   Period Weeks   Status On-going   PT LONG TERM GOAL #3   Title Ambulate x 300 ft with least restrictive assistive device and supervision for improved functional mobility in the home and limited community distances.   Baseline Target date 05/08/2015   Time 8   Period Weeks   Status On-going   PT LONG TERM GOAL #4   Title The patient will improve stair negotiate x 4 steps to supervision with step to pattern and one handrails.   Baseline  Target date 05/08/2015   Time 8   Period Weeks   Status On-going   PT LONG TERM GOAL #5   Title Improve SIS Mobility by 20% (baseline 41.7%).   Baseline Target date 05/08/2015   Time 8   Period Weeks   Status On-going   PT LONG TERM GOAL #6   Title Improve gait speed  from 1.71 ft/sec to > or equal to 2.4 ft/sec for improved functional ambulation.   Baseline Target date 05/08/2015   Time 8   Period Weeks   Status On-going   PT LONG TERM GOAL #7   Title The patient will decrease TUG from 19.63 seconds to < or equal to 15 seconds to demo improved functional mobility.   Baseline Target date 05/08/2015   Time 8   Period Weeks   Status New               Plan - 04/04/15 4696    Clinical Impression Statement The patient is improving stability in therapy without a device working on gait mechanics and normalization of R sided movement.  PT added home stretching to help with night time muscle spasms.   PT Next Visit Plan Begin checking STGs, gait, balance.        Problem List Patient Active Problem List   Diagnosis Date Noted  . Dysphagia S/P CVA (cerebrovascular accident) 03/14/2015  . Dysphagia following cerebral infarction 02/15/2015  . Cerebral infarction due to thrombosis of basilar artery   . CKD stage 2 due to type 2 diabetes mellitus 02/12/2015  . Right hemiparesis   . CVA (cerebral infarction) 02/10/2015  . CKD (chronic kidney disease) 02/10/2015  . Type 2 diabetes mellitus with diabetic neuropathy 02/10/2015  . Neck pain   . Numbness and tingling 02/09/2015  . Back pain, acute 10/30/2012  . Hematuria of undiagnosed cause 10/30/2012  . Proteinuria 10/30/2012  . Left shoulder pain 07/17/2012  . Plantar fasciitis, bilateral 07/17/2012  . HEADACHE 01/07/2009  . Peripheral neuropathy 11/06/2007  . ERECTILE DYSFUNCTION, MILD 11/06/2007  . 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 Essential hypertension 07/08/2007  . EXTRINSIC ASTHMA,  UNSPECIFIED 06/25/2007    Jarron Curley , PT  04/04/2015, 1:45 PM  [facility] [address] [place], [place], [address] Phone: [phone]   Fax:  [phone]

## 2015-04-06 ENCOUNTER — Encounter: Payer: Self-pay | Admitting: Occupational Therapy

## 2015-04-06 ENCOUNTER — Ambulatory Visit: Payer: BC Managed Care – PPO

## 2015-04-06 ENCOUNTER — Ambulatory Visit: Payer: BC Managed Care – PPO | Admitting: Occupational Therapy

## 2015-04-06 ENCOUNTER — Encounter: Payer: Self-pay | Admitting: Physical Therapy

## 2015-04-06 ENCOUNTER — Ambulatory Visit: Payer: BC Managed Care – PPO | Admitting: Physical Therapy

## 2015-04-06 VITALS — BP 146/73

## 2015-04-06 VITALS — BP 148/79 | HR 81

## 2015-04-06 DIAGNOSIS — IMO0002 Reserved for concepts with insufficient information to code with codable children: Secondary | ICD-10-CM

## 2015-04-06 DIAGNOSIS — M6281 Muscle weakness (generalized): Secondary | ICD-10-CM

## 2015-04-06 DIAGNOSIS — I69359 Hemiplegia and hemiparesis following cerebral infarction affecting unspecified side: Secondary | ICD-10-CM

## 2015-04-06 DIAGNOSIS — I6980 Unspecified sequelae of other cerebrovascular disease: Secondary | ICD-10-CM | POA: Diagnosis not present

## 2015-04-06 DIAGNOSIS — R269 Unspecified abnormalities of gait and mobility: Secondary | ICD-10-CM

## 2015-04-06 NOTE — Therapy (Signed)
[facility] [address] [place], [place], [address] Phone: [phone]   Fax:  [phone]  Occupational Therapy Treatment  Patient Details  Name: Russell Austin MRN: [id number] Date of Birth: 12/18/1950 Referring Provider:  Dorena Cookey, MD  Encounter Date: 04/06/2015      OT End of Session - 04/06/15 0949    Visit Number 5   Number of Visits 17   Date for OT Re-Evaluation 05/10/15   Authorization Type [employer] PPO   OT Start Time 0800   OT Stop Time 0843   OT Time Calculation (min) 43 min   Activity Tolerance Patient tolerated treatment well      Past Medical History  Diagnosis Date  . Diabetes mellitus type II   . Hypertension   . ED (erectile dysfunction)   . Diabetic retinopathy FOLLOWED BY DR Zadie Rhine  . 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 Glaucoma of both eyes   . Left hydrocele     Past Surgical History  Procedure Laterality Date  . Undescended right testicle removed  1994  . Right eye vitrectomy/ insertion glaucoma seton/ laser repair  12-13-2008    RETINAL ARTERY OCCLUSION /NEOVASCULAR GLAUCOMA/ HEMORRHAGE  . Right eye vitretomy/ insertion glaucoma seton x2/ laser  03-24-2009    RECURRENT HEMORRHAGE/ OCCLUSION INTERNAL SETON  . Left eye laser retina repair  SEPT 2012  . Appendectomy  AGE EARLY 20'S  . Hydrocele excision  03/31/2012    Procedure: HYDROCELECTOMY ADULT;  Surgeon: Franchot Gallo, MD;  Location: [facility];  Service: Urology;  Laterality: Left;  [place]:   04/06/15 0846  BP: 146/73    Visit Diagnosis:  Lack of coordination due to stroke  Hemiparesis affecting nondominant side as late effect of cerebrovascular accident      Subjective Assessment - 04/06/15 0806    Subjective  My hand is still rubberband tight, like it's coming out of my skin   Pertinent History cerebral infarct 02/09/15, diabetic retinopathy with  Rt eye legally blind, DM   Patient Stated Goals to return to cooking, cleaning and use of RT arm   Currently in Pain? No/denies            [facility] OT Assessment - 04/06/15 0001    Coordination   Box and Blocks Rt = 17                  OT Treatments/Exercises (OP) - 04/06/15 0001    Neurological Re-education Exercises   Scapular Stabilization Prone  and standing with wt. bearing   Other Exercises 1 Posterior sh. girdle strengthening in prone: scapula retraction bilaterally with min tactile cues for Rt; followed by RUE over EOB for sh. extension exercise with min facilitation to prevent IR; progressed to scap. retraction with shoulder extension/elbow flexion RUE over EOB with min to mod assist to prevent compensations in sh. abd and IR.    Other Weight-Bearing Exercises 1 Modified push ups at wall x 10 reps, 2 sets with min support RUE for scapula stabilization   Functional Reaching Activities   Mid Level Functional reaching to place checkers into Connect 4 slots for coordination, RUE open chain reaching, and functional use. Pt with min drops                OT Education - 04/06/15 0948    Education provided Yes   Education Details Therapist instructed pt to  perform scapula retraction (prone) for HEP   Person(s) Educated Patient   Methods Explanation;Demonstration   Comprehension Verbalized understanding;Returned demonstration          OT Short Term Goals - 04/06/15 0950    OT SHORT TERM GOAL #1   Title Independent w/ initial HEP for RUE (DUE 04/09/15)   Time 4   Period Weeks   Status Achieved   OT SHORT TERM GOAL #2   Title Independent w/ edema management strategies for Rt hand   Time 4   Period Weeks   Status Achieved   OT SHORT TERM GOAL #3   Title Improve RUE functional use as demo by performing 22 or > blocks on Box & Blocks test   Baseline 15   Time 4   Period Weeks   Status On-going  04/06/15 = 17   OT SHORT TERM GOAL #4   Title Pt to increase  grip strength to 35 lbs or greater Rt hand to assist with opening containers   Baseline 22 lbs   Time 4   Period Weeks   Status On-going   OT SHORT TERM GOAL #5   Title Pt to demo 125* shoulder flexion to retrieve/replace lightweight object on high shelf with min compensations and no pain   Baseline 110-120* with moderate compensations into IR and forearm pronation, occasional pain from spasms   Time 4   Period Weeks   Status On-going           OT Long Term Goals - 03/10/15 1034    OT LONG TERM GOAL #1   Title Independent w/ updated HEP prn (due 05/10/15)   Time 8   Period Weeks   Status New   OT LONG TERM GOAL #2   Title Improve coordination Rt hand as evidenced by performing 9 hole peg test in under 2 minutes   Baseline eval = unable   Time 8   Period Weeks   Status New   OT LONG TERM GOAL #3   Title Improve RUE function as evidenced by performing 30 or > blocks on Box & Blocks test   Baseline eval = 15   Time 8   Period Weeks   Status New   OT LONG TERM GOAL #4   Title Pt to perform simple stovetop cooking task at mod I level safely   Baseline dependent   Time 8   Period Weeks   Status New   OT LONG TERM GOAL #5   Title Pt to perform light household cleaning tasks at mod I level safely   Baseline dependent    Time 8   Period Weeks   Status New               Plan - 04/06/15 0951    Plan Assess remaining STG's, continue in hand manipulation as able, NMR (wt. bearing, scapula strengthening), UBE   OT Home Exercise Plan CVA warning signs and risk factors, edema management techniques, initial HEP 03/16/15. (Verbally added in hand manipulation ex 04/04/15, scapula retraction ex 04/06/15)        Problem List Patient Active Problem List   Diagnosis Date Noted  . Dysphagia S/P CVA (cerebrovascular accident) 03/14/2015  . Dysphagia following cerebral infarction 02/15/2015  . Cerebral infarction due to thrombosis of basilar artery   . CKD stage 2 due to type 2  diabetes mellitus 02/12/2015  . Right hemiparesis   . CVA (cerebral infarction) 02/10/2015  . CKD (chronic kidney disease) 02/10/2015  .  Type 2 diabetes mellitus with diabetic neuropathy 02/10/2015  . Neck pain   . Numbness and tingling 02/09/2015  . Back pain, acute 10/30/2012  . Hematuria of undiagnosed cause 10/30/2012  . Proteinuria 10/30/2012  . Left shoulder pain 07/17/2012  . Plantar fasciitis, bilateral 07/17/2012  . HEADACHE 01/07/2009  . Peripheral neuropathy 11/06/2007  . ERECTILE DYSFUNCTION, MILD 11/06/2007  . 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 Essential hypertension 07/08/2007  . EXTRINSIC ASTHMA, UNSPECIFIED 06/25/2007    Carey Bullocks, OTR/L 04/06/2015, 9:55 AM  [facility] [address] [place], [place], [address] Phone: [phone]   Fax:  [phone]

## 2015-04-07 NOTE — Therapy (Signed)
[facility] [address] [place], [place], [address] Phone: [phone]   Fax:  [phone]  Physical Therapy Treatment  Patient Details  Name: Russell Austin MRN: [id number] Date of Birth: [birth year] Referring Provider:  Dorena Cookey, MD  Encounter Date: 04/06/2015      PT End of Session - 04/06/15 0851    Visit Number 7   Number of Visits 16   Date for PT Re-Evaluation 05/08/15   Authorization Type check benefits with insurance specialist to clarify*   PT Start Time 0846   PT Stop Time 0930   PT Time Calculation (min) 44 min   Equipment Utilized During Treatment Gait belt   Activity Tolerance Patient tolerated treatment well   Behavior During Therapy [facility] for tasks assessed/performed      Past Medical History  Diagnosis Date  . Diabetes mellitus type II   . Hypertension   . ED (erectile dysfunction)   . Diabetic retinopathy FOLLOWED BY DR Zadie Rhine  . 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 Glaucoma of both eyes   . Left hydrocele     Past Surgical History  Procedure Laterality Date  . Undescended right testicle removed  1994  . Right eye vitrectomy/ insertion glaucoma seton/ laser repair  12-13-2008    RETINAL ARTERY OCCLUSION /NEOVASCULAR GLAUCOMA/ HEMORRHAGE  . Right eye vitretomy/ insertion glaucoma seton x2/ laser  03-24-2009    RECURRENT HEMORRHAGE/ OCCLUSION INTERNAL SETON  . Left eye laser retina repair  SEPT 2012  . Appendectomy  AGE EARLY 20'S  . Hydrocele excision  03/31/2012    Procedure: HYDROCELECTOMY ADULT;  Surgeon: Franchot Gallo, MD;  Location: [facility];  Service: Urology;  Laterality: Left;  45 MINS      Filed Vitals:   04/06/15 0850  BP: 148/79  Pulse: 81    Visit Diagnosis:  Abnormality of gait  Generalized muscle weakness      Subjective Assessment - 04/06/15 0850    Subjective Still having trouble sleeping due to muscle  spasms. Reports he is doing the new stretches at home. Walked into clinic today with hurrycane. No falls or pain to report.   Currently in Pain? No/denies   Pain Score 0-No pain           [facility] Adult PT Treatment/Exercise - 04/06/15 2952    Transfers   Sit to Stand 5: Supervision;With upper extremity assist;Without upper extremity assist;From bed;From chair/3-in-1   Stand to Sit 5: Supervision;With upper extremity assist;Without upper extremity assist;To bed;To chair/3-in-1   Ambulation/Gait   Ambulation/Gait Yes   Ambulation/Gait Assistance 4: Min guard   Ambulation/Gait Assistance Details cues on equal step length, increased base of support and for posture with gait. Pt with increased gait stability when he slows down and focuses on correcting his gait deviations.    Ambulation Distance (Feet) 240 Feet   Assistive device Straight cane  hurry cane   Gait Pattern Step-through pattern;Decreased hip/knee flexion - right;Scissoring;Decreased trunk rotation;Narrow base of support;Poor foot clearance - right;Right circumduction;Decreased step length - left;Decreased stance time - right   Ambulation Surface Level;Indoor   Gait velocity 20.85 sec's= 1.57 ft/sec with hurry cane and min guard assist   Stairs Yes   Stairs Assistance 5: Supervision   Stairs Assistance Details (indicate cue type and reason) up facing forward with reciprocal pattern, down sideways with step to pattern   Stair Management Technique One rail Left;Alternating pattern;Step to pattern;Sideways;Forwards   Number  of Stairs 4   Berg Balance Test   Sit to Stand Able to stand without using hands and stabilize independently   Standing Unsupported Able to stand safely 2 minutes   Sitting with Back Unsupported but Feet Supported on Floor or Stool Able to sit safely and securely 2 minutes   Stand to Sit Sits safely with minimal use of hands   Transfers Able to transfer safely, minor use of hands   Standing Unsupported with Eyes  Closed Able to stand 10 seconds safely   Standing Ubsupported with Feet Together Able to place feet together independently and stand 1 minute safely   From Standing, Reach Forward with Outstretched Arm Can reach forward >12 cm safely (5")  8 inches   From Standing Position, Pick up Object from Floor Able to pick up shoe safely and easily   From Standing Position, Turn to Look Behind Over each Shoulder Looks behind one side only/other side shows less weight shift  right > left   Turn 360 Degrees Able to turn 360 degrees safely one side only in 4 seconds or less  < 4 sec to left, > 4 sec to right   Standing Unsupported, Alternately Place Feet on Step/Stool Able to stand independently and safely and complete 8 steps in 20 seconds  11 sec's   Standing Unsupported, One Foot in Front Able to plae foot ahead of the other independently and hold 30 seconds   Standing on One Leg Able to lift leg independently and hold 5-10 seconds   Total Score 51           PT Short Term Goals - 04/06/15 0907    PT SHORT TERM GOAL #1   Title The patient will verbalize understanding of stroke risk factors and warning signs.   Baseline Target date 04/08/2015   Time --   Period --   Status On-going   PT SHORT TERM GOAL #2   Title The patient will improve Berg score from 30/56 up to 38/56 to demo decreased risk for falls.   Baseline 04/06/15: 51/56 scored   Time --   Period --   Status Achieved   PT SHORT TERM GOAL #3   Title The patient will have gait further assessed and goal to follow (held today due to elvated BP).   Baseline Met on 03/16/2015 and new goal below.   Status Achieved   PT SHORT TERM GOAL #4   Title The patient will be able to negotiate his steps at home without sitting down to descend steps per report.   Baseline 04/06/15: pt reports using rail to walk up/down stairs at home now. demo in clinic: pt ascends with 1 rail fwd reciprocally, decends sideways with 1 rail step to pattern   Time --    Period --   Status Achieved   PT SHORT TERM GOAL #5   Title The patient will transfer sit<>stand and stand pivot modified indep.   Baseline Target date 04/08/2015 - met on 04/04/2015   Status Achieved   PT SHORT TERM GOAL #6   Title The patient will improve gait speed from 1.71 ft/sec up to 2.1 ft/sec to demo improving mobility.   Baseline 04/06/15: 1.57 ft/sec with hurry cane and min guard assist to supervision   Time --   Period --   Status Not Met   PT SHORT TERM GOAL #7   Title The patient will ambulate for household distances x 150 ft with CGA with least restrictive  assistive device (baseline min A with SBQC).   Baseline 04/06/15: 240 feet today with hurry cane with min guard assist to supervision at times   Time --   Period --   Status Achieved           PT Long Term Goals - 04/04/15 3267    PT LONG TERM GOAL #1   Title The patient will be indep with HEP.   Baseline Target date 05/08/2015   Time 8   Period Weeks   Status On-going   PT LONG TERM GOAL #2   Title The patient will improve Berg from 30/56 up to > or equal to 42/56.   Baseline Target date 05/08/2015   Time 8   Period Weeks   Status On-going   PT LONG TERM GOAL #3   Title Ambulate x 300 ft with least restrictive assistive device and supervision for improved functional mobility in the home and limited community distances.   Baseline Target date 05/08/2015   Time 8   Period Weeks   Status On-going   PT LONG TERM GOAL #4   Title The patient will improve stair negotiate x 4 steps to supervision with step to pattern and one handrails.   Baseline Target date 05/08/2015   Time 8   Period Weeks   Status On-going   PT LONG TERM GOAL #5   Title Improve SIS Mobility by 20% (baseline 41.7%).   Baseline Target date 05/08/2015   Time 8   Period Weeks   Status On-going   PT LONG TERM GOAL #6   Title Improve gait speed from 1.71 ft/sec to > or equal to 2.4 ft/sec for improved functional ambulation.   Baseline Target date  05/08/2015   Time 8   Period Weeks   Status On-going   PT LONG TERM GOAL #7   Title The patient will decrease TUG from 19.63 seconds to < or equal to 15 seconds to demo improved functional mobility.   Baseline Target date 05/08/2015   Time 8   Period Weeks   Status New           Plan - 04/06/15 1245    Clinical Impression Statement Pt has met all but 2 of his STG's (still with decreased gait speed and CVA risk factors/warning signs has not been addressed to date). Pt is making steady improvement and progress toward all unmet goals.    Pt will benefit from skilled therapeutic intervention in order to improve on the following deficits Abnormal gait;Decreased activity tolerance;Decreased balance;Difficulty walking;Postural dysfunction;Impaired sensation;Decreased mobility;Decreased strength   Rehab Potential Good   Clinical Impairments Affecting Rehab Potential HTN   PT Frequency 2x / week   PT Duration 8 weeks   PT Treatment/Interventions ADLs/Self Care Home Management;Gait training;Neuromuscular re-education;Balance training;Therapeutic exercise;Therapeutic activities;Functional mobility training;Stair training;Patient/family education;Manual techniques   PT Next Visit Plan CVA risk factors/warning signs for remaining STG;continue to work on gait mechanics, strengthening and balance toward LTGs   PT Home Exercise Plan LE strengthening, stretching, general mobility   Consulted and Agree with Plan of Care Patient        Problem List Patient Active Problem List   Diagnosis Date Noted  . Dysphagia S/P CVA (cerebrovascular accident) 03/14/2015  . Dysphagia following cerebral infarction 02/15/2015  . Cerebral infarction due to thrombosis of basilar artery   . CKD stage 2 due to type 2 diabetes mellitus 02/12/2015  . Right hemiparesis   . CVA (cerebral infarction) 02/10/2015  . CKD (chronic  kidney disease) 02/10/2015  . Type 2 diabetes mellitus with diabetic neuropathy 02/10/2015  .  Neck pain   . Numbness and tingling 02/09/2015  . Back pain, acute 10/30/2012  . Hematuria of undiagnosed cause 10/30/2012  . Proteinuria 10/30/2012  . Left shoulder pain 07/17/2012  . Plantar fasciitis, bilateral 07/17/2012  . HEADACHE 01/07/2009  . Peripheral neuropathy 11/06/2007  . ERECTILE DYSFUNCTION, MILD 11/06/2007  . 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 Essential hypertension 07/08/2007  . EXTRINSIC ASTHMA, UNSPECIFIED 06/25/2007    Willow Ora 04/07/2015, 9:22 AM  Willow Ora, PTA, [facility] [address] [place], [place] [address] [phone] 04/07/2015, 9:22 AM

## 2015-04-14 ENCOUNTER — Ambulatory Visit: Payer: BC Managed Care – PPO | Admitting: Physical Therapy

## 2015-04-14 ENCOUNTER — Encounter: Payer: BC Managed Care – PPO | Admitting: Occupational Therapy

## 2015-04-14 ENCOUNTER — Ambulatory Visit: Payer: BC Managed Care – PPO | Admitting: Occupational Therapy

## 2015-04-14 ENCOUNTER — Encounter: Payer: Self-pay | Admitting: Physical Therapy

## 2015-04-14 ENCOUNTER — Encounter: Payer: Self-pay | Admitting: Occupational Therapy

## 2015-04-14 VITALS — BP 146/77 | HR 74

## 2015-04-14 DIAGNOSIS — I69359 Hemiplegia and hemiparesis following cerebral infarction affecting unspecified side: Secondary | ICD-10-CM

## 2015-04-14 DIAGNOSIS — M6281 Muscle weakness (generalized): Secondary | ICD-10-CM

## 2015-04-14 DIAGNOSIS — IMO0002 Reserved for concepts with insufficient information to code with codable children: Secondary | ICD-10-CM

## 2015-04-14 DIAGNOSIS — I6980 Unspecified sequelae of other cerebrovascular disease: Secondary | ICD-10-CM | POA: Diagnosis not present

## 2015-04-14 DIAGNOSIS — R269 Unspecified abnormalities of gait and mobility: Secondary | ICD-10-CM

## 2015-04-14 NOTE — Therapy (Signed)
[facility] [address] [place], [place], [address] Phone: [phone]   Fax:  [phone]  Physical Therapy Treatment  Patient Details  Name: Russell Austin MRN: [id number] Date of Birth: 24-Mar-1951 Referring Provider:  Dorena Cookey, MD  Encounter Date: 04/14/2015      PT End of Session - 04/14/15 0808    Visit Number 8   Number of Visits 16   Date for PT Re-Evaluation 05/08/15   Authorization Type check benefits with insurance specialist to clarify*   PT Start Time 0805   PT Stop Time 0845   PT Time Calculation (min) 40 min   Equipment Utilized During Treatment Gait belt   Activity Tolerance Patient tolerated treatment well   Behavior During Therapy [facility] for tasks assessed/performed      Past Medical History  Diagnosis Date  . Diabetes mellitus type II   . Hypertension   . ED (erectile dysfunction)   . Diabetic retinopathy FOLLOWED BY DR Zadie Rhine  . 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 Glaucoma of both eyes   . Left hydrocele     Past Surgical History  Procedure Laterality Date  . Undescended right testicle removed  1994  . Right eye vitrectomy/ insertion glaucoma seton/ laser repair  12-13-2008    RETINAL ARTERY OCCLUSION /NEOVASCULAR GLAUCOMA/ HEMORRHAGE  . Right eye vitretomy/ insertion glaucoma seton x2/ laser  03-24-2009    RECURRENT HEMORRHAGE/ OCCLUSION INTERNAL SETON  . Left eye laser retina repair  SEPT 2012  . Appendectomy  AGE EARLY 20'S  . Hydrocele excision  03/31/2012    Procedure: HYDROCELECTOMY ADULT;  Surgeon: Franchot Gallo, MD;  Location: [facility];  Service: Urology;  Laterality: Left;  45 MINS      Filed Vitals:   04/14/15 0807  BP: 146/77  Pulse: 74    Visit Diagnosis:  Abnormality of gait  Generalized muscle weakness  Lack of coordination due to stroke  Hemiparesis affecting nondominant side as late effect of  cerebrovascular accident      Subjective Assessment - 04/14/15 0807    Subjective No new complaints. No pain or falls to report.    Currently in Pain? No/denies   Pain Score 0-No pain          [facility] Adult PT Treatment/Exercise - 04/14/15 0818    Transfers   Sit to Stand 5: Supervision;With upper extremity assist;Without upper extremity assist;From bed;From chair/3-in-1   Sit to Stand Details Verbal cues for technique   Sit to Stand Details (indicate cue type and reason) cues to scoot forward sometimes needed;cues to use right leg more with standing   Stand to Sit 5: Supervision;With upper extremity assist;Without upper extremity assist;To bed;To chair/3-in-1   Stand to Sit Details (indicate cue type and reason) Verbal cues for precautions/safety   Stand to Sit Details cues to turn/back completely to surface before sitting down   Ambulation/Gait   Ambulation/Gait Yes   Ambulation/Gait Assistance 4: Min guard   Ambulation/Gait Assistance Details cues on right UE relaxation/arm swing, cues to increase right hip/knee flexion with swing phase and for increased stance time on right and increased step length with left leg.                     Ambulation Distance (Feet) 650 Feet  x1; 500 feet x 1 outdoors   Assistive device Straight cane  hurry cane   Gait Pattern Step-through pattern;Decreased hip/knee flexion -  right;Scissoring;Decreased trunk rotation;Narrow base of support;Poor foot clearance - right;Right circumduction;Decreased step length - left;Decreased stance time - right   Ambulation Surface Level;Indoor;Unlevel;Outdoor;Paved;Gravel;Other (comment)  rubber mulch   Stairs Yes   Stairs Assistance 5: Supervision   Number of Stairs 4  x 2 reps   Ramp 4: Min assist  min guard assist with 2cd rep   Ramp Details (indicate cue type and reason) cues on sequence and technique. Improved on 2cd rep.   Curb 4: Min assist  min guard assist with 2cd rep   Curb Details (indicate cue type  and reason) cues on sequence and technique. Improved on 2cd rep.   Knee/Hip Exercises: Aerobic   Other Aerobic Scifit x 4 extremities level 2.0 x 8 minutes for strengthening and activity tolerance with goal rpm >/= 55            PT Education - 04/14/15 0814    Education provided Yes   Education Details CVA risk factors and warning signs   Person(s) Educated Patient   Methods Explanation;Demonstration;Handout   Comprehension Verbalized understanding;Returned demonstration          PT Short Term Goals - 04/14/15 0854    PT SHORT TERM GOAL #1   Title The patient will verbalize understanding of stroke risk factors and warning signs.   Baseline edcuated and information given on 04/14/15.   Status Achieved   PT SHORT TERM GOAL #2   Title The patient will improve Berg score from 30/56 up to 38/56 to demo decreased risk for falls.   Baseline 04/06/15: 51/56 scored   Status Achieved   PT SHORT TERM GOAL #3   Title The patient will have gait further assessed and goal to follow (held today due to elvated BP).   Baseline Met on 03/16/2015 and new goal below.   Status Achieved   PT SHORT TERM GOAL #4   Title The patient will be able to negotiate his steps at home without sitting down to descend steps per report.   Baseline 04/06/15: pt reports using rail to walk up/down stairs at home now. demo in clinic: pt ascends with 1 rail fwd reciprocally, decends sideways with 1 rail step to pattern   Status Achieved   PT SHORT TERM GOAL #5   Title The patient will transfer sit<>stand and stand pivot modified indep.   Baseline Target date 04/08/2015 - met on 04/04/2015   Status Achieved   PT SHORT TERM GOAL #6   Title The patient will improve gait speed from 1.71 ft/sec up to 2.1 ft/sec to demo improving mobility.   Baseline 04/06/15: 1.57 ft/sec with hurry cane and min guard assist to supervision   Status Not Met   PT SHORT TERM GOAL #7   Title The patient will ambulate for household distances x 150 ft  with CGA with least restrictive assistive device (baseline min A with SBQC).   Baseline 04/06/15: 240 feet today with hurry cane with min guard assist to supervision at times   Status Achieved           PT Long Term Goals - 04/04/15 4166    PT LONG TERM GOAL #1   Title The patient will be indep with HEP.   Baseline Target date 05/08/2015   Time 8   Period Weeks   Status On-going   PT LONG TERM GOAL #2   Title The patient will improve Berg from 30/56 up to > or equal to 42/56.   Baseline Target date  05/08/2015   Time 8   Period Weeks   Status On-going   PT LONG TERM GOAL #3   Title Ambulate x 300 ft with least restrictive assistive device and supervision for improved functional mobility in the home and limited community distances.   Baseline Target date 05/08/2015   Time 8   Period Weeks   Status On-going   PT LONG TERM GOAL #4   Title The patient will improve stair negotiate x 4 steps to supervision with step to pattern and one handrails.   Baseline Target date 05/08/2015   Time 8   Period Weeks   Status On-going   PT LONG TERM GOAL #5   Title Improve SIS Mobility by 20% (baseline 41.7%).   Baseline Target date 05/08/2015   Time 8   Period Weeks   Status On-going   PT LONG TERM GOAL #6   Title Improve gait speed from 1.71 ft/sec to > or equal to 2.4 ft/sec for improved functional ambulation.   Baseline Target date 05/08/2015   Time 8   Period Weeks   Status On-going   PT LONG TERM GOAL #7   Title The patient will decrease TUG from 19.63 seconds to < or equal to 15 seconds to demo improved functional mobility.   Baseline Target date 05/08/2015   Time 8   Period Weeks   Status New           Plan - 04/14/15 8657    Clinical Impression Statement Pt with improved balance using hurry cane today vs previous session. Still requires cues to correct gait deviations. Making steady progress toward goals.   Pt will benefit from skilled therapeutic intervention in order to improve on  the following deficits Abnormal gait;Decreased activity tolerance;Decreased balance;Difficulty walking;Postural dysfunction;Impaired sensation;Decreased mobility;Decreased strength   Rehab Potential Good   Clinical Impairments Affecting Rehab Potential HTN   PT Frequency 2x / week   PT Duration 8 weeks   PT Treatment/Interventions ADLs/Self Care Home Management;Gait training;Neuromuscular re-education;Balance training;Therapeutic exercise;Therapeutic activities;Functional mobility training;Stair training;Patient/family education;Manual techniques   PT Next Visit Plan ;continue to work on gait mechanics, strengthening and balance toward LTGs. Initiate balance on compliant surfaces and single leg stance activities.   PT Home Exercise Plan LE strengthening, stretching, general mobility   Consulted and Agree with Plan of Care Patient        Problem List Patient Active Problem List   Diagnosis Date Noted  . Dysphagia S/P CVA (cerebrovascular accident) 03/14/2015  . Dysphagia following cerebral infarction 02/15/2015  . Cerebral infarction due to thrombosis of basilar artery   . CKD stage 2 due to type 2 diabetes mellitus 02/12/2015  . Right hemiparesis   . CVA (cerebral infarction) 02/10/2015  . CKD (chronic kidney disease) 02/10/2015  . Type 2 diabetes mellitus with diabetic neuropathy 02/10/2015  . Neck pain   . Numbness and tingling 02/09/2015  . Back pain, acute 10/30/2012  . Hematuria of undiagnosed cause 10/30/2012  . Proteinuria 10/30/2012  . Left shoulder pain 07/17/2012  . Plantar fasciitis, bilateral 07/17/2012  . HEADACHE 01/07/2009  . Peripheral neuropathy 11/06/2007  . ERECTILE DYSFUNCTION, MILD 11/06/2007  . 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 Essential hypertension 07/08/2007  . EXTRINSIC ASTHMA, UNSPECIFIED 06/25/2007    Willow Ora 04/14/2015, 8:59 AM  Willow Ora, PTA, [facility] [address]  [place], [place] [address] [phone] 04/14/2015, 9:00 AM

## 2015-04-14 NOTE — Therapy (Signed)
[facility] [address] [place], [place], [address] Phone: [phone]   Fax:  [phone]  Occupational Therapy Treatment  Patient Details  Name: Russell Austin MRN: [id number] Date of Birth: [birth year] Referring Provider:  Dorena Cookey, MD  Encounter Date: 04/14/2015      OT End of Session - 04/14/15 1350    Visit Number 6   Number of Visits 17   Date for OT Re-Evaluation 05/10/15   Authorization Type [employer] PPO, no visit limit/auth   OT Start Time 0850   OT Stop Time 0930   OT Time Calculation (min) 40 min   Activity Tolerance Patient tolerated treatment well   Behavior During Therapy [facility] for tasks assessed/performed      Past Medical History  Diagnosis Date  . Diabetes mellitus type II   . Hypertension   . ED (erectile dysfunction)   . Diabetic retinopathy FOLLOWED BY DR Zadie Rhine  . 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 Glaucoma of both eyes   . Left hydrocele     Past Surgical History  Procedure Laterality Date  . Undescended right testicle removed  1994  . Right eye vitrectomy/ insertion glaucoma seton/ laser repair  12-13-2008    RETINAL ARTERY OCCLUSION /NEOVASCULAR GLAUCOMA/ HEMORRHAGE  . Right eye vitretomy/ insertion glaucoma seton x2/ laser  03-24-2009    RECURRENT HEMORRHAGE/ OCCLUSION INTERNAL SETON  . Left eye laser retina repair  SEPT 2012  . Appendectomy  AGE EARLY 20'S  . Hydrocele excision  03/31/2012    Procedure: HYDROCELECTOMY ADULT;  Surgeon: Franchot Gallo, MD;  Location: [facility];  Service: Urology;  Laterality: Left;  45 MINS      There were no vitals filed for this visit.  Visit Diagnosis:  Hemiparesis affecting nondominant side as late effect of cerebrovascular accident  Lack of coordination due to stroke      Subjective Assessment - 04/14/15 1348    Subjective  My arm feels like a rubberband.  It's just so tight.   Pertinent History cerebral infarct 02/09/15, diabetic retinopathy with Rt eye legally blind, DM   Patient Stated Goals to return to cooking, cleaning and use of RT arm   Currently in Pain? No/denies                      OT Treatments/Exercises (OP) - 04/14/15 1353    ADLs   Overall ADLs Finished checking STGs (see goals section for progress).   Neurological Re-education Exercises   Shoulder Flexion AAROM;Both;Standing  with ball along wall   Shoulder Horizontal ABduction AAROM;Both;Seated  cane ex with min v.c.   Other Exercises 1 AAROM shoulder flex with ball in sitting with min facilitation/cues for compensation and scapular facilitation.   Other Exercises 2 Mid-range functional reaching with RUE to grasp cylinder objects with min v.c. for compensation (IR/elbow flex) in various positions.   Seated with weight on hand with body on arm movements and lateral wt. shift with min v.c.   Other Weight-Bearing Exercises 1 modified quadraped with forward/backward wt. shifts for incr scapular stabilization and decr tone with min v.c.  Wall push-ups for incr scapular stabilization with min facilitation.        In-hand manipulation of large cylinder pegs with mod difficulty, but improved with repetition.          OT Education - 04/14/15 1349    Education Details Proper positioning of RUE with movement/reaching.  Encouraged pt to perform mid-range functional reaching at home observing proper positioning.   Person(s) Educated Patient   Methods Explanation   Comprehension Verbalized understanding          OT Short Term Goals - 04/14/15 0921    OT SHORT TERM GOAL #1   Title Independent w/ initial HEP for RUE (DUE 04/09/15)   Time 4   Period Weeks   Status Achieved   OT SHORT TERM GOAL #2   Title Independent w/ edema management strategies for Rt hand   Time 4   Period Weeks   Status Achieved   OT SHORT TERM GOAL #3   Title Improve RUE functional use as demo by performing  22 or > blocks on Box & Blocks test   Baseline 15   Time 4   Period Weeks   Status On-going  04/06/15 = 17   OT SHORT TERM GOAL #4   Title Pt to increase grip strength to 35 lbs or greater Rt hand to assist with opening containers   Baseline 22 lbs   Time 4   Period Weeks   Status On-going  04/14/15:  not met- 23lbs.   OT SHORT TERM GOAL #5   Title Pt to demo 125* shoulder flexion to retrieve/replace lightweight object on high shelf with min compensations and no pain   Baseline 110-120* with moderate compensations into IR and forearm pronation, occasional pain from spasms   Time 4   Period Weeks   Status On-going  04/14/15:  Not met. 120* with min compensation (with min cueing)           OT Long Term Goals - 03/10/15 1034    OT LONG TERM GOAL #1   Title Independent w/ updated HEP prn (due 05/10/15)   Time 8   Period Weeks   Status New   OT LONG TERM GOAL #2   Title Improve coordination Rt hand as evidenced by performing 9 hole peg test in under 2 minutes   Baseline eval = unable   Time 8   Period Weeks   Status New   OT LONG TERM GOAL #3   Title Improve RUE function as evidenced by performing 30 or > blocks on Box & Blocks test   Baseline eval = 15   Time 8   Period Weeks   Status New   OT LONG TERM GOAL #4   Title Pt to perform simple stovetop cooking task at mod I level safely   Baseline dependent   Time 8   Period Weeks   Status New   OT LONG TERM GOAL #5   Title Pt to perform light household cleaning tasks at mod I level safely   Baseline dependent    Time 8   Period Weeks   Status New               Plan - 04/14/15 1351    Clinical Impression Statement Pt progressing towards goals (but slower due to decr frequency with scheduling and spasticity).  Pt demo improved in-hand manipulation with repetition and improved functional reaching after neuro re-ed.   Plan neuro re-ed   OT Home Exercise Plan CVA warning signs and risk factors, edema management  techniques, initial HEP 03/16/15. (Verbally added in hand manipulation ex 04/04/15, scapula retraction ex 04/06/15)   Consulted and Agree with Plan of Care Patient        Problem List Patient Active Problem List   Diagnosis Date Noted  .  Dysphagia S/P CVA (cerebrovascular accident) 03/14/2015  . Dysphagia following cerebral infarction 02/15/2015  . Cerebral infarction due to thrombosis of basilar artery   . CKD stage 2 due to type 2 diabetes mellitus 02/12/2015  . Right hemiparesis   . CVA (cerebral infarction) 02/10/2015  . CKD (chronic kidney disease) 02/10/2015  . Type 2 diabetes mellitus with diabetic neuropathy 02/10/2015  . Neck pain   . Numbness and tingling 02/09/2015  . Back pain, acute 10/30/2012  . Hematuria of undiagnosed cause 10/30/2012  . Proteinuria 10/30/2012  . Left shoulder pain 07/17/2012  . Plantar fasciitis, bilateral 07/17/2012  . HEADACHE 01/07/2009  . Peripheral neuropathy 11/06/2007  . ERECTILE DYSFUNCTION, MILD 11/06/2007  . 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 Essential hypertension 07/08/2007  . EXTRINSIC ASTHMA, UNSPECIFIED 06/25/2007    [facility] 04/14/2015, 2:03 PM  [place] [address] [place], [place], [address] Phone: [phone]   Fax:  [place], OTR/L 04/14/2015 2:03 PM

## 2015-04-14 NOTE — Patient Instructions (Signed)
Ischemic Stroke °A stroke (cerebrovascular accident) is the sudden death of brain tissue. It is a medical emergency. A stroke can cause permanent loss of brain function. This can cause problems with different parts of your body. A transient ischemic attack (TIA) is different because it does not cause permanent damage. A TIA is a short-lived problem of poor blood flow affecting a part of the brain. A TIA is also a serious problem because having a TIA greatly increases the chances of having a stroke. When symptoms first develop, you cannot know if the problem might be a stroke or a TIA. °CAUSES  °A stroke is caused by a decrease of oxygen supply to an area of your brain. It is usually the result of a small blood clot or collection of cholesterol or fat (plaque) that blocks blood flow in the brain. A stroke can also be caused by blocked or damaged carotid arteries.  °RISK FACTORS °· High blood pressure (hypertension). °· High cholesterol. °· Diabetes mellitus. °· Heart disease. °· The buildup of plaque in the blood vessels (peripheral artery disease or atherosclerosis). °· The buildup of plaque in the blood vessels providing blood and oxygen to the brain (carotid artery stenosis). °· An abnormal heart rhythm (atrial fibrillation). °· Obesity. °· Smoking. °· Taking oral contraceptives (especially in combination with smoking). °· Physical inactivity. °· A diet high in fats, salt (sodium), and calories. °· Alcohol use. °· Use of illegal drugs (especially cocaine and methamphetamine). °· Being African American. °· Being over the age of 55. °· Family history of stroke. °· Previous history of blood clots, stroke, TIA, or heart attack. °· Sickle cell disease. °SYMPTOMS  °These symptoms usually develop suddenly, or may be newly present upon awakening from sleep: °· Sudden weakness or numbness of the face, arm, or leg, especially on one side of the body. °· Sudden trouble walking or difficulty moving arms or legs. °· Sudden  confusion. °· Sudden personality changes. °· Trouble speaking (aphasia) or understanding. °· Difficulty swallowing. °· Sudden trouble seeing in one or both eyes. °· Double vision. °· Dizziness. °· Loss of balance or coordination. °· Sudden severe headache with no known cause. °· Trouble reading or writing. °DIAGNOSIS  °Your health care provider can often determine the presence or absence of a stroke based on your symptoms, history, and physical exam. Computed tomography (CT) of the brain is usually performed to confirm the stroke, determine causes, and determine stroke severity. Other tests may be done to find the cause of the stroke. These tests may include: °· Electrocardiography. °· Continuous heart monitoring. °· Echocardiography. °· Carotid ultrasonography. °· Magnetic resonance imaging (MRI). °· A scan of the brain circulation. °· Blood tests. °PREVENTION  °The risk of a stroke can be decreased by appropriately treating high blood pressure, high cholesterol, diabetes, heart disease, and obesity and by quitting smoking, limiting alcohol, and staying physically active. °TREATMENT  °Time is of the essence. It is important to seek treatment at the first sign of these symptoms because you may receive a medicine to dissolve the clot (thrombolytic) that cannot be given if too much time has passed since your symptoms began. Even if you do not know when your symptoms began, get treatment as soon as possible as there are other treatment options available including oxygen, intravenous (IV) fluids, and medicines to thin the blood (anticoagulants). Treatment of stroke depends on the duration, severity, and cause of your symptoms. Medicines and dietary changes may be used to address diabetes, high blood   pressure, and other risk factors. Physical, speech, and occupational therapists will assess you and work with you to improve any functions impaired by the stroke. Measures will be taken to prevent short-term and long-term  complications, including infection from breathing foreign material into the lungs (aspiration pneumonia), blood clots in the legs, bedsores, and falls. Rarely, surgery may be needed to remove large blood clots or to open up blocked arteries. °HOME CARE INSTRUCTIONS  °· Take medicines only as directed by your health care provider. Follow the directions carefully. Medicines may be used to control risk factors for a stroke. Be sure you understand all your medicine instructions. °· You may be told to take a medicine to thin the blood, such as aspirin or the anticoagulant warfarin. Warfarin needs to be taken exactly as instructed. °¨ Too much and too little warfarin are both dangerous. Too much warfarin increases the risk of bleeding. Too little warfarin continues to allow the risk for blood clots. While taking warfarin, you will need to have regular blood tests to measure your blood clotting time. These blood tests usually include both the PT and INR tests. The PT and INR results allow your health care provider to adjust your dose of warfarin. The dose can change for many reasons. It is critically important that you take warfarin exactly as prescribed, and that you have your PT and INR levels drawn exactly as directed. °¨ Many foods, especially foods high in vitamin K, can interfere with warfarin and affect the PT and INR results. Foods high in vitamin K include spinach, kale, broccoli, cabbage, collard and turnip greens, brussels sprouts, peas, cauliflower, seaweed, and parsley, as well as beef and pork liver, green tea, and soybean oil. You should eat a consistent amount of foods high in vitamin K. Avoid major changes in your diet, or notify your health care provider before changing your diet. Arrange a visit with a dietitian to answer your questions. °¨ Many medicines can interfere with warfarin and affect the PT and INR results. You must tell your health care provider about any and all medicines you take. This  includes all vitamins and supplements. Be especially cautious with aspirin and anti-inflammatory medicines. Do not take or discontinue any prescribed or over-the-counter medicine except on the advice of your health care provider or pharmacist. °¨ Warfarin can have side effects, such as excessive bruising or bleeding. You will need to hold pressure over cuts for longer than usual. Your health care provider or pharmacist will discuss other potential side effects. °¨ Avoid sports or activities that may cause injury or bleeding. °¨ Be mindful when shaving, flossing your teeth, or handling sharp objects. °¨ Alcohol can change the body's ability to handle warfarin. It is best to avoid alcoholic drinks or consume only very small amounts while taking warfarin. Notify your health care provider if you change your alcohol intake. °¨ Notify your dentist or other health care providers before procedures. °· If swallow studies have determined that your swallowing reflex is present, you should eat healthy foods. Including 5 or more servings of fruits and vegetables a day may reduce the risk of stroke. Foods may need to be a certain consistency (soft or pureed), or small bites may need to be taken in order to avoid aspirating or choking. Certain dietary changes may be advised to address high blood pressure, high cholesterol, diabetes, or obesity. °¨ Food choices that are low in sodium, saturated fat, trans fat, and cholesterol are recommended to manage high blood pressure. °¨   Food choies that are high in fiber, and low in saturated fat, trans fat, and cholesterol may control cholesterol levels. °¨ Controlling carbohydrates and sugar intake is recommended to manage diabetes. °¨ Reducing calorie intake and making food choices that are low in sodium, saturated fat, trans fat, and cholesterol are recommended to manage obesity. °· Maintain a healthy weight. °· Stay physically active. It is recommended that you get at least 30 minutes of  activity on all or most days. °· Do not use any tobacco products including cigarettes, chewing tobacco, or electronic cigarettes. °· Limit alcohol use even if you are not taking warfarin. Moderate alcohol use is considered to be: °¨ No more than 2 drinks each day for men. °¨ No more than 1 drink each day for nonpregnant women. °· Home safety. A safe home environment is important to reduce the risk of falls. Your health care provider may arrange for specialists to evaluate your home. Having grab bars in the bedroom and bathroom is often important. Your health care provider may arrange for equipment to be used at home, such as raised toilets and a seat for the shower. °· Physical, occupational, and speech therapy. Ongoing therapy may be needed to maximize your recovery after a stroke. If you have been advised to use a walker or a cane, use it at all times. Be sure to keep your therapy appointments. °· Follow all instructions for follow-up with your health care provider. This is very important. This includes any referrals, physical therapy, rehabilitation, and lab tests. Proper follow-up can prevent another stroke from occurring. °SEEK MEDICAL CARE IF: °· You have personality changes. °· You have difficulty swallowing. °· You are seeing double. °· You have dizziness. °· You have a fever. °· You have skin breakdown. °SEEK IMMEDIATE MEDICAL CARE IF:  °Any of these symptoms may represent a serious problem that is an emergency. Do not wait to see if the symptoms will go away. Get medical help right away. Call your local emergency services (911 in [place] Do not drive yourself to the hospital. °· You have sudden weakness or numbness of the face, arm, or leg, especially on one side of the body. °· You have sudden trouble walking or difficulty moving arms or legs. °· You have sudden confusion. °· You have trouble speaking (aphasia) or understanding. °· You have sudden trouble seeing in one or both eyes. °· You have a loss of  balance or coordination. °· You have a sudden, severe headache with no known cause. °· You have new chest pain or an irregular heartbeat. °· You have a partial or total loss of consciousness. °Document Released: 08/20/2005 Document Revised: 01/04/2014 Document Reviewed: 03/30/2012 °[employer]® Patient Information ©2015 [employer]. This information is not intended to replace advice given to you by your health care provider. Make sure you discuss any questions you have with your health care provider. ° °

## 2015-04-15 ENCOUNTER — Ambulatory Visit: Payer: BC Managed Care – PPO | Admitting: Physical Therapy

## 2015-04-19 ENCOUNTER — Encounter: Payer: Self-pay | Admitting: Physical Therapy

## 2015-04-19 ENCOUNTER — Ambulatory Visit: Payer: BC Managed Care – PPO | Admitting: Physical Therapy

## 2015-04-19 VITALS — BP 151/80 | HR 77

## 2015-04-19 DIAGNOSIS — M6281 Muscle weakness (generalized): Secondary | ICD-10-CM

## 2015-04-19 DIAGNOSIS — IMO0002 Reserved for concepts with insufficient information to code with codable children: Secondary | ICD-10-CM

## 2015-04-19 DIAGNOSIS — I6980 Unspecified sequelae of other cerebrovascular disease: Secondary | ICD-10-CM | POA: Diagnosis not present

## 2015-04-19 DIAGNOSIS — R269 Unspecified abnormalities of gait and mobility: Secondary | ICD-10-CM

## 2015-04-19 NOTE — Therapy (Signed)
[facility] [address] [place], [place], [address] Phone: [phone]   Fax:  [phone]  Physical Therapy Treatment  Patient Details  Name: Russell Austin MRN: [id number] Date of Birth: 1950/10/28 Referring Provider:  Dorena Cookey, MD  Encounter Date: 04/19/2015   04/19/15 0806  PT Visits / Re-Eval  Visit Number 9  Number of Visits 16  Date for PT Re-Evaluation 05/08/15  Authorization  Authorization Type check benefits with insurance specialist to clarify*  PT Time Calculation  PT Start Time 0803  PT Stop Time 0845  PT Time Calculation (min) 42 min  PT - End of Session  Equipment Utilized During Treatment Gait belt  Activity Tolerance Patient tolerated treatment well  Behavior During Therapy [facility] for tasks assessed/performed     Past Medical History  Diagnosis Date  . Diabetes mellitus type II   . Hypertension   . ED (erectile dysfunction)   . Diabetic retinopathy FOLLOWED BY DR Zadie Rhine  . 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 Glaucoma of both eyes   . Left hydrocele     Past Surgical History  Procedure Laterality Date  . Undescended right testicle removed  1994  . Right eye vitrectomy/ insertion glaucoma seton/ laser repair  12-13-2008    RETINAL ARTERY OCCLUSION /NEOVASCULAR GLAUCOMA/ HEMORRHAGE  . Right eye vitretomy/ insertion glaucoma seton x2/ laser  03-24-2009    RECURRENT HEMORRHAGE/ OCCLUSION INTERNAL SETON  . Left eye laser retina repair  SEPT 2012  . Appendectomy  AGE EARLY 20'S  . Hydrocele excision  03/31/2012    Procedure: HYDROCELECTOMY ADULT;  Surgeon: Franchot Gallo, MD;  Location: [facility];  Service: Urology;  Laterality: Left;  45 MINS      Filed Vitals:   04/19/15 0806  BP: 151/80  Pulse: 77    Visit Diagnosis:  Abnormality of gait  Generalized muscle weakness  Lack of coordination due to stroke      Subjective  Assessment - 04/19/15 0806    Subjective No new complaints. No pain or falls to report.    Currently in Pain? No/denies   Pain Score 0-No pain          [facility] Adult PT Treatment/Exercise - 04/19/15 0818    Transfers   Sit to Stand 5: Supervision;With upper extremity assist;Without upper extremity assist;From bed;From chair/3-in-1   Stand to Sit 5: Supervision;With upper extremity assist;Without upper extremity assist;To bed;To chair/3-in-1   Ambulation/Gait   Ambulation/Gait Yes   Ambulation/Gait Assistance 4: Min guard   Ambulation/Gait Assistance Details cues on posture and to correct gait deviations   Ambulation Distance (Feet) 240 Feet  x1, 500 x1 indoors/outdoors combined   Assistive device Straight cane   Gait Pattern Step-through pattern;Decreased hip/knee flexion - right;Scissoring;Decreased trunk rotation;Narrow base of support;Poor foot clearance - right;Right circumduction;Decreased step length - left;Decreased stance time - right   Ambulation Surface Level;Indoor;Unlevel;Outdoor;Paved;Gravel;Grass     Neuro Re-ed: On red mats at counter: High knee marching, toe walk, heel walk and tandem walk. All forward/backwards x 3 laps each with min guard assist and occasional UE support on counter top.         PT Short Term Goals - 04/14/15 0854    PT SHORT TERM GOAL #1   Title The patient will verbalize understanding of stroke risk factors and warning signs.   Baseline edcuated and information given on 04/14/15.   Status Achieved   PT SHORT TERM GOAL #2  Title The patient will improve Berg score from 30/56 up to 38/56 to demo decreased risk for falls.   Baseline 04/06/15: 51/56 scored   Status Achieved   PT SHORT TERM GOAL #3   Title The patient will have gait further assessed and goal to follow (held today due to elvated BP).   Baseline Met on 03/16/2015 and new goal below.   Status Achieved   PT SHORT TERM GOAL #4   Title The patient will be able to negotiate his steps  at home without sitting down to descend steps per report.   Baseline 04/06/15: pt reports using rail to walk up/down stairs at home now. demo in clinic: pt ascends with 1 rail fwd reciprocally, decends sideways with 1 rail step to pattern   Status Achieved   PT SHORT TERM GOAL #5   Title The patient will transfer sit<>stand and stand pivot modified indep.   Baseline Target date 04/08/2015 - met on 04/04/2015   Status Achieved   PT SHORT TERM GOAL #6   Title The patient will improve gait speed from 1.71 ft/sec up to 2.1 ft/sec to demo improving mobility.   Baseline 04/06/15: 1.57 ft/sec with hurry cane and min guard assist to supervision   Status Not Met   PT SHORT TERM GOAL #7   Title The patient will ambulate for household distances x 150 ft with CGA with least restrictive assistive device (baseline min A with SBQC).   Baseline 04/06/15: 240 feet today with hurry cane with min guard assist to supervision at times   Status Achieved           PT Long Term Goals - 04/04/15 7169    PT LONG TERM GOAL #1   Title The patient will be indep with HEP.   Baseline Target date 05/08/2015   Time 8   Period Weeks   Status On-going   PT LONG TERM GOAL #2   Title The patient will improve Berg from 30/56 up to > or equal to 42/56.   Baseline Target date 05/08/2015   Time 8   Period Weeks   Status On-going   PT LONG TERM GOAL #3   Title Ambulate x 300 ft with least restrictive assistive device and supervision for improved functional mobility in the home and limited community distances.   Baseline Target date 05/08/2015   Time 8   Period Weeks   Status On-going   PT LONG TERM GOAL #4   Title The patient will improve stair negotiate x 4 steps to supervision with step to pattern and one handrails.   Baseline Target date 05/08/2015   Time 8   Period Weeks   Status On-going   PT LONG TERM GOAL #5   Title Improve SIS Mobility by 20% (baseline 41.7%).   Baseline Target date 05/08/2015   Time 8   Period Weeks    Status On-going   PT LONG TERM GOAL #6   Title Improve gait speed from 1.71 ft/sec to > or equal to 2.4 ft/sec for improved functional ambulation.   Baseline Target date 05/08/2015   Time 8   Period Weeks   Status On-going   PT LONG TERM GOAL #7   Title The patient will decrease TUG from 19.63 seconds to < or equal to 15 seconds to demo improved functional mobility.   Baseline Target date 05/08/2015   Time 8   Period Weeks   Status New        04/19/15 6789  Plan  Clinical Impression Statement Pt challenged with balance activities today. Making steady progress toward goals.  Pt will benefit from skilled therapeutic intervention in order to improve on the following deficits Abnormal gait;Decreased activity tolerance;Decreased balance;Difficulty walking;Postural dysfunction;Impaired sensation;Decreased mobility;Decreased strength  Rehab Potential Good  Clinical Impairments Affecting Rehab Potential HTN  PT Frequency 2x / week  PT Duration 8 weeks  PT Treatment/Interventions ADLs/Self Care Home Management;Gait training;Neuromuscular re-education;Balance training;Therapeutic exercise;Therapeutic activities;Functional mobility training;Stair training;Patient/family education;Manual techniques  PT Next Visit Plan ;continue to work on gait mechanics, strengthening and balance toward LTGs. Initiate balance on compliant surfaces and single leg stance activities.  PT Home Exercise Plan LE strengthening, stretching, general mobility  Consulted and Agree with Plan of Care Patient    Problem List Patient Active Problem List   Diagnosis Date Noted  . Dysphagia S/P CVA (cerebrovascular accident) 03/14/2015  . Dysphagia following cerebral infarction 02/15/2015  . Cerebral infarction due to thrombosis of basilar artery   . CKD stage 2 due to type 2 diabetes mellitus 02/12/2015  . Right hemiparesis   . CVA (cerebral infarction) 02/10/2015  . CKD (chronic kidney disease) 02/10/2015  . Type 2 diabetes  mellitus with diabetic neuropathy 02/10/2015  . Neck pain   . Numbness and tingling 02/09/2015  . Back pain, acute 10/30/2012  . Hematuria of undiagnosed cause 10/30/2012  . Proteinuria 10/30/2012  . Left shoulder pain 07/17/2012  . Plantar fasciitis, bilateral 07/17/2012  . HEADACHE 01/07/2009  . Peripheral neuropathy 11/06/2007  . ERECTILE DYSFUNCTION, MILD 11/06/2007  . 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 Essential hypertension 07/08/2007  . EXTRINSIC ASTHMA, UNSPECIFIED 06/25/2007    Willow Ora 04/19/2015, 8:27 AM  Willow Ora, PTA, [facility] [address], [place], [place] [address] [phone] 04/19/2015, 8:27 AM

## 2015-04-22 ENCOUNTER — Ambulatory Visit: Payer: BC Managed Care – PPO | Admitting: Occupational Therapy

## 2015-04-22 ENCOUNTER — Ambulatory Visit: Payer: BC Managed Care – PPO | Admitting: Rehabilitative and Restorative Service Providers"

## 2015-04-22 DIAGNOSIS — I69359 Hemiplegia and hemiparesis following cerebral infarction affecting unspecified side: Secondary | ICD-10-CM

## 2015-04-22 DIAGNOSIS — R269 Unspecified abnormalities of gait and mobility: Secondary | ICD-10-CM

## 2015-04-22 DIAGNOSIS — M6281 Muscle weakness (generalized): Secondary | ICD-10-CM

## 2015-04-22 DIAGNOSIS — I6980 Unspecified sequelae of other cerebrovascular disease: Secondary | ICD-10-CM | POA: Diagnosis not present

## 2015-04-22 DIAGNOSIS — IMO0002 Reserved for concepts with insufficient information to code with codable children: Secondary | ICD-10-CM

## 2015-04-22 NOTE — Therapy (Signed)
[facility] [address] [place], [place], [address] Phone: [phone]   Fax:  [phone]  Physical Therapy Treatment  Patient Details  Name: Russell Austin MRN: [id number] Date of Birth: [birth year] Referring Provider:  Dorena Cookey, MD  Encounter Date: 04/22/2015      PT End of Session - 04/22/15 1025    Visit Number 10   Number of Visits 16   Date for PT Re-Evaluation 05/08/15   Authorization Type check benefits with insurance specialist to clarify*   PT Start Time 0850   PT Stop Time 0935   PT Time Calculation (min) 45 min   Equipment Utilized During Treatment Gait belt   Activity Tolerance Patient tolerated treatment well   Behavior During Therapy [facility] for tasks assessed/performed      Past Medical History  Diagnosis Date  . Diabetes mellitus type II   . Hypertension   . ED (erectile dysfunction)   . Diabetic retinopathy FOLLOWED BY DR Zadie Rhine  . 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 Glaucoma of both eyes   . Left hydrocele     Past Surgical History  Procedure Laterality Date  . Undescended right testicle removed  1994  . Right eye vitrectomy/ insertion glaucoma seton/ laser repair  12-13-2008    RETINAL ARTERY OCCLUSION /NEOVASCULAR GLAUCOMA/ HEMORRHAGE  . Right eye vitretomy/ insertion glaucoma seton x2/ laser  03-24-2009    RECURRENT HEMORRHAGE/ OCCLUSION INTERNAL SETON  . Left eye laser retina repair  SEPT 2012  . Appendectomy  AGE EARLY 20'S  . Hydrocele excision  03/31/2012    Procedure: HYDROCELECTOMY ADULT;  Surgeon: Franchot Gallo, MD;  Location: [facility];  Service: Urology;  Laterality: Left;  45 MINS      There were no vitals filed for this visit.  Visit Diagnosis:  Abnormality of gait  Generalized muscle weakness      Subjective Assessment - 04/22/15 0851    Subjective The patient reports that he has increased muscle spasms and  is not able to sleep at night due to discomfort.  OT addressed R UE and shoulder region and he notes R LE discomfort in IT band and hip flexor region.   Pertinent History diabetes with blindness R eye    Patient Stated Goals Be able to move freely (walk, pick up things, drive); regain independence   Currently in Pain? No/denies  OT helped UE, only when lying down.      Gait: The patient ambulated 230 feet with SPC (tripod tip) with cues on R knee flexion at initial swing and R heel at initial contact of stance phase Gait with UE positioning into ER and trunk elongation cues without a device with intermittent tactile cues to help facilitate R knee flexion at initial swing x 230 feet x 3 reps Marching with UE positioning x 50 feet  THERAPEUTIC EXERCISE: Bilateral LE stretching including: piriformis stretch, trunk rotation, IT band stretch supine, hamstring stretch, heel cord stretch, hip adductor stretch. Standing bilateral heel raises x 10 reps Standing wide leg squats with hip abduction for weight shifting x 5 reps with min A for safety  NEUROMUSCULAR RE-EDUCATION: Sit<>stand with weight shifting R LE with compliant surface under the L foot. Standing R hip control moving L LE onto/off of surface Single limb stance R for mini knee bends and heel raises   SELF CARE/HOME MANAGEMENT: Demonstrated sleeping position in L sidelying for patient comfort and UE using pillow with  pillow between knees for improved support          PT Education - 04/22/15 1024    Education provided Yes   Education Details sleeping positions to support R UE and LE for improved rest/spasticity mgmt.   Person(s) Educated Patient   Methods Explanation   Comprehension Verbalized understanding;Returned demonstration          PT Short Term Goals - 04/14/15 0854    PT SHORT TERM GOAL #1   Title The patient will verbalize understanding of stroke risk factors and warning signs.   Baseline edcuated and  information given on 04/14/15.   Status Achieved   PT SHORT TERM GOAL #2   Title The patient will improve Berg score from 30/56 up to 38/56 to demo decreased risk for falls.   Baseline 04/06/15: 51/56 scored   Status Achieved   PT SHORT TERM GOAL #3   Title The patient will have gait further assessed and goal to follow (held today due to elvated BP).   Baseline Met on 03/16/2015 and new goal below.   Status Achieved   PT SHORT TERM GOAL #4   Title The patient will be able to negotiate his steps at home without sitting down to descend steps per report.   Baseline 04/06/15: pt reports using rail to walk up/down stairs at home now. demo in clinic: pt ascends with 1 rail fwd reciprocally, decends sideways with 1 rail step to pattern   Status Achieved   PT SHORT TERM GOAL #5   Title The patient will transfer sit<>stand and stand pivot modified indep.   Baseline Target date 04/08/2015 - met on 04/04/2015   Status Achieved   PT SHORT TERM GOAL #6   Title The patient will improve gait speed from 1.71 ft/sec up to 2.1 ft/sec to demo improving mobility.   Baseline 04/06/15: 1.57 ft/sec with hurry cane and min guard assist to supervision   Status Not Met   PT SHORT TERM GOAL #7   Title The patient will ambulate for household distances x 150 ft with CGA with least restrictive assistive device (baseline min A with SBQC).   Baseline 04/06/15: 240 feet today with hurry cane with min guard assist to supervision at times   Status Achieved           PT Long Term Goals - 04/04/15 3710    PT LONG TERM GOAL #1   Title The patient will be indep with HEP.   Baseline Target date 05/08/2015   Time 8   Period Weeks   Status On-going   PT LONG TERM GOAL #2   Title The patient will improve Berg from 30/56 up to > or equal to 42/56.   Baseline Target date 05/08/2015   Time 8   Period Weeks   Status On-going   PT LONG TERM GOAL #3   Title Ambulate x 300 ft with least restrictive assistive device and supervision for  improved functional mobility in the home and limited community distances.   Baseline Target date 05/08/2015   Time 8   Period Weeks   Status On-going   PT LONG TERM GOAL #4   Title The patient will improve stair negotiate x 4 steps to supervision with step to pattern and one handrails.   Baseline Target date 05/08/2015   Time 8   Period Weeks   Status On-going   PT LONG TERM GOAL #5   Title Improve SIS Mobility by 20% (baseline 41.7%).  Baseline Target date 05/08/2015   Time 8   Period Weeks   Status On-going   PT LONG TERM GOAL #6   Title Improve gait speed from 1.71 ft/sec to > or equal to 2.4 ft/sec for improved functional ambulation.   Baseline Target date 05/08/2015   Time 8   Period Weeks   Status On-going   PT LONG TERM GOAL #7   Title The patient will decrease TUG from 19.63 seconds to < or equal to 15 seconds to demo improved functional mobility.   Baseline Target date 05/08/2015   Time 8   Period Weeks   Status New               Plan - 04/22/15 1026    Clinical Impression Statement The patient has increasing muscle spasticity hindering sleeping at night.  PT performed stretching and education re: sleeping/support of R UE/LE for home.  Continuing to progress gait mechanics in PT with emphasis on R stance control to end stance with initial swing focusing on knee flexion of R knee to enable improved momentum to achieve R heel strike.   PT Next Visit Plan ;continue to work on gait mechanics, strengthening and balance toward LTGs. Initiate balance on compliant surfaces and single leg stance activities.  *patient with limited visits with insurance*     Consulted and Agree with Plan of Care Patient        Problem List Patient Active Problem List   Diagnosis Date Noted  . Dysphagia S/P CVA (cerebrovascular accident) 03/14/2015  . Dysphagia following cerebral infarction 02/15/2015  . Cerebral infarction due to thrombosis of basilar artery   . CKD stage 2 due to type 2  diabetes mellitus 02/12/2015  . Right hemiparesis   . CVA (cerebral infarction) 02/10/2015  . CKD (chronic kidney disease) 02/10/2015  . Type 2 diabetes mellitus with diabetic neuropathy 02/10/2015  . Neck pain   . Numbness and tingling 02/09/2015  . Back pain, acute 10/30/2012  . Hematuria of undiagnosed cause 10/30/2012  . Proteinuria 10/30/2012  . Left shoulder pain 07/17/2012  . Plantar fasciitis, bilateral 07/17/2012  . HEADACHE 01/07/2009  . Peripheral neuropathy 11/06/2007  . ERECTILE DYSFUNCTION, MILD 11/06/2007  . 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 Essential hypertension 07/08/2007  . EXTRINSIC ASTHMA, UNSPECIFIED 06/25/2007    Weldon Nouri, PT 04/22/2015, 10:29 AM  [facility] [address] [place], [place], [address] Phone: [phone]   Fax:  [phone]

## 2015-04-22 NOTE — Therapy (Signed)
[facility] [address] [place], [place], [address] Phone: [phone]   Fax:  [phone]  Occupational Therapy Treatment  Patient Details  Name: Russell Austin MRN: [id number] Date of Birth: 10-11-1950 Referring Provider:  Dorena Cookey, MD  Encounter Date: 04/22/2015      OT End of Session - 04/22/15 0818    Visit Number 7   Number of Visits 17   Date for OT Re-Evaluation 05/10/15   Authorization Type [employer] PPO, no visit limit/auth   OT Start Time 0805   OT Stop Time 0845   OT Time Calculation (min) 40 min   Activity Tolerance Patient tolerated treatment well   Behavior During Therapy [facility] for tasks assessed/performed      Past Medical History  Diagnosis Date  . Diabetes mellitus type II   . Hypertension   . ED (erectile dysfunction)   . Diabetic retinopathy FOLLOWED BY DR Zadie Rhine  . 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 Glaucoma of both eyes   . Left hydrocele     Past Surgical History  Procedure Laterality Date  . Undescended right testicle removed  1994  . Right eye vitrectomy/ insertion glaucoma seton/ laser repair  12-13-2008    RETINAL ARTERY OCCLUSION /NEOVASCULAR GLAUCOMA/ HEMORRHAGE  . Right eye vitretomy/ insertion glaucoma seton x2/ laser  03-24-2009    RECURRENT HEMORRHAGE/ OCCLUSION INTERNAL SETON  . Left eye laser retina repair  SEPT 2012  . Appendectomy  AGE EARLY 20'S  . Hydrocele excision  03/31/2012    Procedure: HYDROCELECTOMY ADULT;  Surgeon: Franchot Gallo, MD;  Location: [facility];  Service: Urology;  Laterality: Left;  45 MINS      There were no vitals filed for this visit.  Visit Diagnosis:  No diagnosis found.      Subjective Assessment - 04/22/15 0806    Pertinent History cerebral infarct 02/09/15, diabetic retinopathy with Rt eye legally blind, DM   Patient Stated Goals to return to cooking, cleaning and use of RT arm   Currently in Pain? Yes   Pain Score 5    Pain Location Arm   Pain Orientation Right   Pain Descriptors / Indicators Aching   Pain Onset More than a month ago   Aggravating Factors  malpositioning   Pain Relieving Factors reposistioning   Multiple Pain Sites No        Treatment: Neuro re-ed supine shoulder flexion, chest press with PVC pipe frame with min facilitation for RUE, then  modifed quadraped over mat rocking forwards anad  backwards for weightbearing/ scapular mobs x 10 reps min facilitation  Seated shoulder abduction AAROM with cane, x 10 reps, seated weightbearing through bilateral UE's with hands  in external rotation followed by lateral weight shifts with both arms to 1 side, 10-15 reps each side. Arm bike x 6 mins level1 for reciprocal movements.                         OT Short Term Goals - 04/14/15 0921    OT SHORT TERM GOAL #1   Title Independent w/ initial HEP for RUE (DUE 04/09/15)   Time 4   Period Weeks   Status Achieved   OT SHORT TERM GOAL #2   Title Independent w/ edema management strategies for Rt hand   Time 4   Period Weeks   Status Achieved   OT SHORT TERM GOAL #3  Title Improve RUE functional use as demo by performing 22 or > blocks on Box & Blocks test   Baseline 15   Time 4   Period Weeks   Status On-going  04/06/15 = 17   OT SHORT TERM GOAL #4   Title Pt to increase grip strength to 35 lbs or greater Rt hand to assist with opening containers   Baseline 22 lbs   Time 4   Period Weeks   Status On-going  04/14/15:  not met- 23lbs.   OT SHORT TERM GOAL #5   Title Pt to demo 125* shoulder flexion to retrieve/replace lightweight object on high shelf with min compensations and no pain   Baseline 110-120* with moderate compensations into IR and forearm pronation, occasional pain from spasms   Time 4   Period Weeks   Status On-going  04/14/15:  Not met. 120* with min compensation (with min cueing)           OT Long Term  Goals - 03/10/15 1034    OT LONG TERM GOAL #1   Title Independent w/ updated HEP prn (due 05/10/15)   Time 8   Period Weeks   Status New   OT LONG TERM GOAL #2   Title Improve coordination Rt hand as evidenced by performing 9 hole peg test in under 2 minutes   Baseline eval = unable   Time 8   Period Weeks   Status New   OT LONG TERM GOAL #3   Title Improve RUE function as evidenced by performing 30 or > blocks on Box & Blocks test   Baseline eval = 15   Time 8   Period Weeks   Status New   OT LONG TERM GOAL #4   Title Pt to perform simple stovetop cooking task at mod I level safely   Baseline dependent   Time 8   Period Weeks   Status New   OT LONG TERM GOAL #5   Title Pt to perform light household cleaning tasks at mod I level safely   Baseline dependent    Time 8   Period Weeks   Status New               Plan - 04/22/15 0813    Clinical Impression Statement Pt is progressing towards goasl limited  by spasticity and limited sensation    Pt will benefit from skilled therapeutic intervention in order to improve on the following deficits (Retired) Decreased cognition;Increased edema;Impaired flexibility;Pain;Decreased coordination;Decreased mobility;Impaired sensation;Improper body mechanics;Decreased activity tolerance;Decreased endurance;Decreased range of motion;Decreased strength;Increased muscle spasms;Impaired tone;Decreased balance;Decreased knowledge of precautions;Decreased safety awareness;Impaired UE functional use   Rehab Potential Good   Clinical Impairments Affecting Rehab Potential HTN   OT Frequency 2x / week   OT Duration 8 weeks   OT Treatment/Interventions Self-care/ADL training;Electrical Stimulation;Therapeutic exercise;Cognitive remediation/compensation;Moist Heat;Parrafin;Neuromuscular education;Splinting;Fluidtherapy;Therapist, nutritional;Therapeutic exercises;Patient/family education;DME and/or AE instruction;Manual Therapy;Passive range of  motion;Therapeutic activities;Balance training   Plan neuro re-ed   Consulted and Agree with Plan of Care Patient        Problem List Patient Active Problem List   Diagnosis Date Noted  . Dysphagia S/P CVA (cerebrovascular accident) 03/14/2015  . Dysphagia following cerebral infarction 02/15/2015  . Cerebral infarction due to thrombosis of basilar artery   . CKD stage 2 due to type 2 diabetes mellitus 02/12/2015  . Right hemiparesis   . CVA (cerebral infarction) 02/10/2015  . CKD (chronic kidney disease) 02/10/2015  . Type 2 diabetes mellitus with  diabetic neuropathy 02/10/2015  . Neck pain   . Numbness and tingling 02/09/2015  . Back pain, acute 10/30/2012  . Hematuria of undiagnosed cause 10/30/2012  . Proteinuria 10/30/2012  . Left shoulder pain 07/17/2012  . Plantar fasciitis, bilateral 07/17/2012  . HEADACHE 01/07/2009  . Peripheral neuropathy 11/06/2007  . ERECTILE DYSFUNCTION, MILD 11/06/2007  . 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 Essential hypertension 07/08/2007  . EXTRINSIC ASTHMA, UNSPECIFIED 06/25/2007    RINE,KATHRYN 04/22/2015, 8:19 AM Theone Murdoch, OTR/L [phone] Phone: [phone] 8:21 AM 04/22/2015 [facility] [address] [place], [place], [address] Phone: [phone]   Fax:  [phone]

## 2015-04-25 ENCOUNTER — Encounter: Payer: Self-pay | Admitting: Occupational Therapy

## 2015-04-25 ENCOUNTER — Ambulatory Visit: Payer: BC Managed Care – PPO | Admitting: Physical Therapy

## 2015-04-25 ENCOUNTER — Ambulatory Visit: Payer: BC Managed Care – PPO | Admitting: Occupational Therapy

## 2015-04-25 ENCOUNTER — Encounter: Payer: Self-pay | Admitting: Physical Therapy

## 2015-04-25 VITALS — BP 132/74 | HR 80

## 2015-04-25 DIAGNOSIS — I69359 Hemiplegia and hemiparesis following cerebral infarction affecting unspecified side: Secondary | ICD-10-CM

## 2015-04-25 DIAGNOSIS — I6980 Unspecified sequelae of other cerebrovascular disease: Secondary | ICD-10-CM | POA: Diagnosis not present

## 2015-04-25 DIAGNOSIS — M6281 Muscle weakness (generalized): Secondary | ICD-10-CM

## 2015-04-25 DIAGNOSIS — IMO0002 Reserved for concepts with insufficient information to code with codable children: Secondary | ICD-10-CM

## 2015-04-25 DIAGNOSIS — R269 Unspecified abnormalities of gait and mobility: Secondary | ICD-10-CM

## 2015-04-25 NOTE — Therapy (Signed)
[facility] [address] [place], [place], [address] Phone: [phone]   Fax:  [phone]  Occupational Therapy Treatment  Patient Details  Name: Russell Austin MRN: [id number] Date of Birth: 1951-04-21 Referring Provider:  Dorena Cookey, MD  Encounter Date: 04/25/2015      OT End of Session - 04/25/15 0845    Visit Number 8   Number of Visits 17   Date for OT Re-Evaluation 05/10/15   Authorization Type BC[employer] PPO, no visit limit/auth   OT Start Time 0802   OT Stop Time 0845   OT Time Calculation (min) 43 min   Activity Tolerance Patient tolerated treatment well      Past Medical History  Diagnosis Date  . Diabetes mellitus type II   . Hypertension   . ED (erectile dysfunction)   . Diabetic retinopathy FOLLOWED BY DR Zadie Rhine  . 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 Glaucoma of both eyes   . Left hydrocele     Past Surgical History  Procedure Laterality Date  . Undescended right testicle removed  1994  . Right eye vitrectomy/ insertion glaucoma seton/ laser repair  12-13-2008    RETINAL ARTERY OCCLUSION /NEOVASCULAR GLAUCOMA/ HEMORRHAGE  . Right eye vitretomy/ insertion glaucoma seton x2/ laser  03-24-2009    RECURRENT HEMORRHAGE/ OCCLUSION INTERNAL SETON  . Left eye laser retina repair  SEPT 2012  . Appendectomy  AGE EARLY 20'S  . Hydrocele excision  03/31/2012    Procedure: HYDROCELECTOMY ADULT;  Surgeon: Franchot Gallo, MD;  Location: [facility];  Service: Urology;  Laterality: Left;  45 MINS      There were no vitals filed for this visit.  Visit Diagnosis:  Hemiparesis affecting nondominant side as late effect of cerebrovascular accident      Subjective Assessment - 04/25/15 0804    Subjective  I can't get much sleep; I can't get comfortable because my arm gets tight   Pertinent History cerebral infarct 02/09/15, diabetic retinopathy with Rt eye  legally blind, DM   Patient Stated Goals to return to cooking, cleaning and use of RT arm   Currently in Pain? No/denies  just tightness (Due to spasticity)                      OT Treatments/Exercises (OP) - 04/25/15 0001    Exercises   Exercises --  UBE x 8 min. Level 1 for reciprocal mvmt pattern   Neurological Re-education Exercises   Scapular Stabilization Seated;Supine  while wt bearing   Other Exercises 1 AA/ROM shoulder flexion with PVC frame in supine BUE's   Other Exercises 2 Seated: BUE shoulder flexion with palm of hands together to increase proper alignment followed by performing with 1 lb. dumbbell (holding horizontally)   Other Weight-Bearing Exercises 1 Seated: wt bearing through BUE's in shoulder ER and extension with modifications for wrist pain. Progressed to wt bearing over RUE only during sit-squat activity and with trunk rotation during reaching with LUE contralaterally and ipsilaterally   Other Weight-Bearing Exercises 2 Supine: propped on elbows for scapula retraction and depression bilaterally followed by RUE only while reaching LUE                  OT Short Term Goals - 04/14/15 0921    OT SHORT TERM GOAL #1   Title Independent w/ initial HEP for RUE (DUE 04/09/15)   Time 4   Period Weeks  Status Achieved   OT SHORT TERM GOAL #2   Title Independent w/ edema management strategies for Rt hand   Time 4   Period Weeks   Status Achieved   OT SHORT TERM GOAL #3   Title Improve RUE functional use as demo by performing 22 or > blocks on Box & Blocks test   Baseline 15   Time 4   Period Weeks   Status On-going  04/06/15 = 17   OT SHORT TERM GOAL #4   Title Pt to increase grip strength to 35 lbs or greater Rt hand to assist with opening containers   Baseline 22 lbs   Time 4   Period Weeks   Status On-going  04/14/15:  not met- 23lbs.   OT SHORT TERM GOAL #5   Title Pt to demo 125* shoulder flexion to retrieve/replace lightweight  object on high shelf with min compensations and no pain   Baseline 110-120* with moderate compensations into IR and forearm pronation, occasional pain from spasms   Time 4   Period Weeks   Status On-going  04/14/15:  Not met. 120* with min compensation (with min cueing)           OT Long Term Goals - 03/10/15 1034    OT LONG TERM GOAL #1   Title Independent w/ updated HEP prn (due 05/10/15)   Time 8   Period Weeks   Status New   OT LONG TERM GOAL #2   Title Improve coordination Rt hand as evidenced by performing 9 hole peg test in under 2 minutes   Baseline eval = unable   Time 8   Period Weeks   Status New   OT LONG TERM GOAL #3   Title Improve RUE function as evidenced by performing 30 or > blocks on Box & Blocks test   Baseline eval = 15   Time 8   Period Weeks   Status New   OT LONG TERM GOAL #4   Title Pt to perform simple stovetop cooking task at mod I level safely   Baseline dependent   Time 8   Period Weeks   Status New   OT LONG TERM GOAL #5   Title Pt to perform light household cleaning tasks at mod I level safely   Baseline dependent    Time 8   Period Weeks   Status New               Plan - 04/25/15 9371    Clinical Impression Statement Pt progressing with extensor stretches, wt. bearing, and posterior sh. girdle strengthening to control/reduce spasticity.    Plan continue NMR   OT Home Exercise Plan CVA warning signs and risk factors, edema management techniques, initial HEP 03/16/15. (Verbally added in hand manipulation ex 04/04/15, scapula retraction ex 04/06/15)   Consulted and Agree with Plan of Care Patient        Problem List Patient Active Problem List   Diagnosis Date Noted  . Dysphagia S/P CVA (cerebrovascular accident) 03/14/2015  . Dysphagia following cerebral infarction 02/15/2015  . Cerebral infarction due to thrombosis of basilar artery   . CKD stage 2 due to type 2 diabetes mellitus 02/12/2015  . Right hemiparesis   . CVA  (cerebral infarction) 02/10/2015  . CKD (chronic kidney disease) 02/10/2015  . Type 2 diabetes mellitus with diabetic neuropathy 02/10/2015  . Neck pain   . Numbness and tingling 02/09/2015  . Back pain, acute 10/30/2012  . Hematuria  of undiagnosed cause 10/30/2012  . Proteinuria 10/30/2012  . Left shoulder pain 07/17/2012  . Plantar fasciitis, bilateral 07/17/2012  . HEADACHE 01/07/2009  . Peripheral neuropathy 11/06/2007  . ERECTILE DYSFUNCTION, MILD 11/06/2007  . 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 Essential hypertension 07/08/2007  . EXTRINSIC ASTHMA, UNSPECIFIED 06/25/2007    Carey Bullocks, OTR/L 04/25/2015, 8:48 AM  [facility] [address] [place], [place], [address] Phone: [phone]   Fax:  [phone]

## 2015-04-25 NOTE — Therapy (Signed)
[facility] [address] [place], [place], [address] Phone: [phone]   Fax:  [phone]  Physical Therapy Treatment  Patient Details  Name: Russell Austin MRN: [id number] Date of Birth: [birth year] Referring Provider:  Dorena Cookey, MD  Encounter Date: 04/25/2015      PT End of Session - 04/25/15 0848    Visit Number 11   Number of Visits 16   Date for PT Re-Evaluation 05/08/15   Authorization Type check benefits with insurance specialist to clarify*   PT Start Time 0845   PT Stop Time 0930   PT Time Calculation (min) 45 min   Equipment Utilized During Treatment Gait belt   Activity Tolerance Patient tolerated treatment well   Behavior During Therapy [facility] for tasks assessed/performed      Past Medical History  Diagnosis Date  . Diabetes mellitus type II   . Hypertension   . ED (erectile dysfunction)   . Diabetic retinopathy FOLLOWED BY DR Zadie Rhine  . 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 Glaucoma of both eyes   . Left hydrocele     Past Surgical History  Procedure Laterality Date  . Undescended right testicle removed  1994  . Right eye vitrectomy/ insertion glaucoma seton/ laser repair  12-13-2008    RETINAL ARTERY OCCLUSION /NEOVASCULAR GLAUCOMA/ HEMORRHAGE  . Right eye vitretomy/ insertion glaucoma seton x2/ laser  03-24-2009    RECURRENT HEMORRHAGE/ OCCLUSION INTERNAL SETON  . Left eye laser retina repair  SEPT 2012  . Appendectomy  AGE EARLY 20'S  . Hydrocele excision  03/31/2012    Procedure: HYDROCELECTOMY ADULT;  Surgeon: Franchot Gallo, MD;  Location: [facility];  Service: Urology;  Laterality: Left;  45 MINS      Filed Vitals:   04/25/15 0846  BP: 132/74  Pulse: 80    Visit Diagnosis:  Generalized muscle weakness  Lack of coordination due to stroke  Abnormality of gait  Hemiparesis affecting nondominant side as late effect of  cerebrovascular accident      Subjective Assessment - 04/25/15 0846    Subjective No new complaints. No falls or pain to report.    Currently in Pain? No/denies   Pain Score 0-No pain           [facility] Adult PT Treatment/Exercise - 04/25/15 1761    Ambulation/Gait   Ambulation/Gait Yes   Ambulation/Gait Assistance 4: Min guard   Ambulation/Gait Assistance Details cues on posture and for right hip/knee flexion. cues for controlled right foot (heel) contact after intial swing to decrease foot slap with pt is focused on knee flexion.   Ambulation Distance (Feet) 1000 Feet  or more   Assistive device Straight cane   Gait Pattern Step-through pattern;Decreased stride length;Decreased hip/knee flexion - right;Decreased stance time - right;Narrow base of support;Poor foot clearance - right  occasional right foot scuffing   Ambulation Surface Level;Unlevel;Indoor;Outdoor;Paved     Exercises: Using bottom 3 stairs with left rail support: foot taps up/down to each step,2 sets of 10 reps. Emphasis on hip/knee flexion, no abduction and to lift each time, not slide foot.  Quadruped over p-roll - right leg extension, 2 sets of 10 reps - right leg glut kick backs,2 sets of 10 reps  Tall kneeling with hands on p-roll: -partial sit backs,2 sets of 10 reps  Scifit level 1.5 x 4 extremities x 6 minutes with goal rpm >/= 50 for strengthening and activity tolerance.  PT Short Term Goals - 04/14/15 0854    PT SHORT TERM GOAL #1   Title The patient will verbalize understanding of stroke risk factors and warning signs.   Baseline edcuated and information given on 04/14/15.   Status Achieved   PT SHORT TERM GOAL #2   Title The patient will improve Berg score from 30/56 up to 38/56 to demo decreased risk for falls.   Baseline 04/06/15: 51/56 scored   Status Achieved   PT SHORT TERM GOAL #3   Title The patient will have gait further assessed and goal to follow (held today due to elvated  BP).   Baseline Met on 03/16/2015 and new goal below.   Status Achieved   PT SHORT TERM GOAL #4   Title The patient will be able to negotiate his steps at home without sitting down to descend steps per report.   Baseline 04/06/15: pt reports using rail to walk up/down stairs at home now. demo in clinic: pt ascends with 1 rail fwd reciprocally, decends sideways with 1 rail step to pattern   Status Achieved   PT SHORT TERM GOAL #5   Title The patient will transfer sit<>stand and stand pivot modified indep.   Baseline Target date 04/08/2015 - met on 04/04/2015   Status Achieved   PT SHORT TERM GOAL #6   Title The patient will improve gait speed from 1.71 ft/sec up to 2.1 ft/sec to demo improving mobility.   Baseline 04/06/15: 1.57 ft/sec with hurry cane and min guard assist to supervision   Status Not Met   PT SHORT TERM GOAL #7   Title The patient will ambulate for household distances x 150 ft with CGA with least restrictive assistive device (baseline min A with SBQC).   Baseline 04/06/15: 240 feet today with hurry cane with min guard assist to supervision at times   Status Achieved           PT Long Term Goals - 04/04/15 2878    PT LONG TERM GOAL #1   Title The patient will be indep with HEP.   Baseline Target date 05/08/2015   Time 8   Period Weeks   Status On-going   PT LONG TERM GOAL #2   Title The patient will improve Berg from 30/56 up to > or equal to 42/56.   Baseline Target date 05/08/2015   Time 8   Period Weeks   Status On-going   PT LONG TERM GOAL #3   Title Ambulate x 300 ft with least restrictive assistive device and supervision for improved functional mobility in the home and limited community distances.   Baseline Target date 05/08/2015   Time 8   Period Weeks   Status On-going   PT LONG TERM GOAL #4   Title The patient will improve stair negotiate x 4 steps to supervision with step to pattern and one handrails.   Baseline Target date 05/08/2015   Time 8   Period Weeks    Status On-going   PT LONG TERM GOAL #5   Title Improve SIS Mobility by 20% (baseline 41.7%).   Baseline Target date 05/08/2015   Time 8   Period Weeks   Status On-going   PT LONG TERM GOAL #6   Title Improve gait speed from 1.71 ft/sec to > or equal to 2.4 ft/sec for improved functional ambulation.   Baseline Target date 05/08/2015   Time 8   Period Weeks   Status On-going   PT LONG TERM  GOAL #7   Title The patient will decrease TUG from 19.63 seconds to < or equal to 15 seconds to demo improved functional mobility.   Baseline Target date 05/08/2015   Time 8   Period Weeks   Status New            Plan - 04/25/15 0848    Clinical Impression Statement Pt with less scuffing noted today vs previous sessions with outdoor uneven surfaces, loss of balance x 1 that he self corrected. Challenged by exercises today with fatigue reported, no pain. Progressing well towards goals.   Pt will benefit from skilled therapeutic intervention in order to improve on the following deficits Abnormal gait;Decreased activity tolerance;Decreased balance;Difficulty walking;Postural dysfunction;Impaired sensation;Decreased mobility;Decreased strength   Rehab Potential Good   Clinical Impairments Affecting Rehab Potential HTN   PT Frequency 2x / week   PT Duration 8 weeks   PT Treatment/Interventions ADLs/Self Care Home Management;Gait training;Neuromuscular re-education;Balance training;Therapeutic exercise;Therapeutic activities;Functional mobility training;Stair training;Patient/family education;Manual techniques   PT Next Visit Plan ;continue to work on gait mechanics, strengthening and balance toward LTGs. Initiate balance on compliant surfaces and single leg stance activities.   PT Home Exercise Plan LE strengthening, stretching, general mobility   Consulted and Agree with Plan of Care Patient        Problem List Patient Active Problem List   Diagnosis Date Noted  . Dysphagia S/P CVA (cerebrovascular  accident) 03/14/2015  . Dysphagia following cerebral infarction 02/15/2015  . Cerebral infarction due to thrombosis of basilar artery   . CKD stage 2 due to type 2 diabetes mellitus 02/12/2015  . Right hemiparesis   . CVA (cerebral infarction) 02/10/2015  . CKD (chronic kidney disease) 02/10/2015  . Type 2 diabetes mellitus with diabetic neuropathy 02/10/2015  . Neck pain   . Numbness and tingling 02/09/2015  . Back pain, acute 10/30/2012  . Hematuria of undiagnosed cause 10/30/2012  . Proteinuria 10/30/2012  . Left shoulder pain 07/17/2012  . Plantar fasciitis, bilateral 07/17/2012  . HEADACHE 01/07/2009  . Peripheral neuropathy 11/06/2007  . ERECTILE DYSFUNCTION, MILD 11/06/2007  . 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 Essential hypertension 07/08/2007  . EXTRINSIC ASTHMA, UNSPECIFIED 06/25/2007    Willow Ora 04/25/2015, 12:18 PM  Willow Ora, PTA, [facility] [address], [place], [place] [address] [phone] 04/25/2015, 12:18 PM

## 2015-04-26 ENCOUNTER — Ambulatory Visit: Payer: BC Managed Care – PPO | Admitting: Occupational Therapy

## 2015-04-26 ENCOUNTER — Encounter: Payer: Self-pay | Admitting: Occupational Therapy

## 2015-04-26 ENCOUNTER — Ambulatory Visit: Payer: BC Managed Care – PPO | Admitting: Physical Therapy

## 2015-04-26 VITALS — BP 145/73

## 2015-04-26 DIAGNOSIS — I69359 Hemiplegia and hemiparesis following cerebral infarction affecting unspecified side: Secondary | ICD-10-CM

## 2015-04-26 DIAGNOSIS — M6281 Muscle weakness (generalized): Secondary | ICD-10-CM

## 2015-04-26 DIAGNOSIS — IMO0002 Reserved for concepts with insufficient information to code with codable children: Secondary | ICD-10-CM

## 2015-04-26 DIAGNOSIS — I6980 Unspecified sequelae of other cerebrovascular disease: Secondary | ICD-10-CM | POA: Diagnosis not present

## 2015-04-26 DIAGNOSIS — R269 Unspecified abnormalities of gait and mobility: Secondary | ICD-10-CM

## 2015-04-26 NOTE — Therapy (Signed)
[facility] [address] [place], [place], [address] Phone: [phone]   Fax:  [phone]  Occupational Therapy Treatment  Patient Details  Name: Russell Austin MRN: [id number] Date of Birth: [birth year] Referring Provider:  Dorena Cookey, MD  Encounter Date: 04/26/2015      OT End of Session - 04/26/15 0838    Visit Number 9   Number of Visits 17   Date for OT Re-Evaluation 05/10/15   Authorization Type [employer] PPO, no visit limit/auth   OT Start Time 0800   OT Stop Time 0845   OT Time Calculation (min) 45 min   Activity Tolerance Patient tolerated treatment well      Past Medical History  Diagnosis Date  . Diabetes mellitus type II   . Hypertension   . ED (erectile dysfunction)   . Diabetic retinopathy FOLLOWED BY DR Zadie Rhine  . 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 Glaucoma of both eyes   . Left hydrocele     Past Surgical History  Procedure Laterality Date  . Undescended right testicle removed  1994  . Right eye vitrectomy/ insertion glaucoma seton/ laser repair  12-13-2008    RETINAL ARTERY OCCLUSION /NEOVASCULAR GLAUCOMA/ HEMORRHAGE  . Right eye vitretomy/ insertion glaucoma seton x2/ laser  03-24-2009    RECURRENT HEMORRHAGE/ OCCLUSION INTERNAL SETON  . Left eye laser retina repair  SEPT 2012  . Appendectomy  AGE EARLY 20'S  . Hydrocele excision  03/31/2012    Procedure: HYDROCELECTOMY ADULT;  Surgeon: Franchot Gallo, MD;  Location: [facility];  Service: Urology;  Laterality: Left;  45 MINS      There were no vitals filed for this visit.  Visit Diagnosis:  Hemiparesis affecting nondominant side as late effect of cerebrovascular accident  Generalized muscle weakness  Lack of coordination due to stroke      Subjective Assessment - 04/26/15 0804    Subjective  I got a better night's sleep last night   Pertinent History cerebral infarct 02/09/15,  diabetic retinopathy with Rt eye legally blind, DM   Patient Stated Goals to return to cooking, cleaning and use of RT arm   Currently in Pain? No/denies                      OT Treatments/Exercises (OP) - 04/26/15 0001    Exercises   Exercises --  UBE X 10 min. for reciprocal mvmt pattern   Neurological Re-education Exercises   Other Exercises 1 AA/ROM BUE's with weighted ball in shoulder flexion, then with trunk rotation to Rt and Lt side. Pt required rest break d/t fatigue   Other Weight-Bearing Exercises 1 Seated: wt bearing through BUE's in shoulder ER and extension with modifications for wrist pain. Progressed to wt bearing over RUE only during sit-squat activity and with trunk rotation during reaching with LUE contralaterally and ipsilaterally   Other Weight-Bearing Exercises 2 Supine: propped on elbows for scapula retraction and depression bilaterally followed by RUE only while reaching LUE   Diagonal Patterns Sitting   Diagonal Patterns Sitting bilaterally with weighted ball for shoulder flexion/trunk rotation   Functional Reaching Activities   Mid Level Pt placing checkers into Connect 4 slots with initial cues to prevent sh. compensations, but able to self correct after initial cueing. Pt completed 4 rows for coordination, RUE functional use and open chain reaching  OT Short Term Goals - 04/14/15 1027    OT SHORT TERM GOAL #1   Title Independent w/ initial HEP for RUE (DUE 04/09/15)   Time 4   Period Weeks   Status Achieved   OT SHORT TERM GOAL #2   Title Independent w/ edema management strategies for Rt hand   Time 4   Period Weeks   Status Achieved   OT SHORT TERM GOAL #3   Title Improve RUE functional use as demo by performing 22 or > blocks on Box & Blocks test   Baseline 15   Time 4   Period Weeks   Status On-going  04/06/15 = 17   OT SHORT TERM GOAL #4   Title Pt to increase grip strength to 35 lbs or greater Rt hand to  assist with opening containers   Baseline 22 lbs   Time 4   Period Weeks   Status On-going  04/14/15:  not met- 23lbs.   OT SHORT TERM GOAL #5   Title Pt to demo 125* shoulder flexion to retrieve/replace lightweight object on high shelf with min compensations and no pain   Baseline 110-120* with moderate compensations into IR and forearm pronation, occasional pain from spasms   Time 4   Period Weeks   Status On-going  04/14/15:  Not met. 120* with min compensation (with min cueing)           OT Long Term Goals - 03/10/15 1034    OT LONG TERM GOAL #1   Title Independent w/ updated HEP prn (due 05/10/15)   Time 8   Period Weeks   Status New   OT LONG TERM GOAL #2   Title Improve coordination Rt hand as evidenced by performing 9 hole peg test in under 2 minutes   Baseline eval = unable   Time 8   Period Weeks   Status New   OT LONG TERM GOAL #3   Title Improve RUE function as evidenced by performing 30 or > blocks on Box & Blocks test   Baseline eval = 15   Time 8   Period Weeks   Status New   OT LONG TERM GOAL #4   Title Pt to perform simple stovetop cooking task at mod I level safely   Baseline dependent   Time 8   Period Weeks   Status New   OT LONG TERM GOAL #5   Title Pt to perform light household cleaning tasks at mod I level safely   Baseline dependent    Time 8   Period Weeks   Status New               Plan - 04/26/15 2536    Clinical Impression Statement Pt reports less spasticity and greater ease sleeping. Pt has greater comprehension of proper alignment of RUE   Plan Continue NMR, re-assess ongoing and unmet STG's   OT Home Exercise Plan CVA warning signs and risk factors, edema management techniques, initial HEP 03/16/15. (Verbally added in hand manipulation ex 04/04/15, scapula retraction ex 04/06/15)   Consulted and Agree with Plan of Care Patient        Problem List Patient Active Problem List   Diagnosis Date Noted  . Dysphagia S/P CVA  (cerebrovascular accident) 03/14/2015  . Dysphagia following cerebral infarction 02/15/2015  . Cerebral infarction due to thrombosis of basilar artery   . CKD stage 2 due to type 2 diabetes mellitus 02/12/2015  . Right hemiparesis   .  CVA (cerebral infarction) 02/10/2015  . CKD (chronic kidney disease) 02/10/2015  . Type 2 diabetes mellitus with diabetic neuropathy 02/10/2015  . Neck pain   . Numbness and tingling 02/09/2015  . Back pain, acute 10/30/2012  . Hematuria of undiagnosed cause 10/30/2012  . Proteinuria 10/30/2012  . Left shoulder pain 07/17/2012  . Plantar fasciitis, bilateral 07/17/2012  . HEADACHE 01/07/2009  . Peripheral neuropathy 11/06/2007  . ERECTILE DYSFUNCTION, MILD 11/06/2007  . 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 Essential hypertension 07/08/2007  . EXTRINSIC ASTHMA, UNSPECIFIED 06/25/2007    Carey Bullocks, OTR/L 04/26/2015, 8:40 AM  [facility] [address] [place], [place], [address] Phone: [phone]   Fax:  [phone]

## 2015-04-27 ENCOUNTER — Ambulatory Visit (INDEPENDENT_AMBULATORY_CARE_PROVIDER_SITE_OTHER): Payer: BC Managed Care – PPO | Admitting: Internal Medicine

## 2015-04-27 ENCOUNTER — Encounter: Payer: Self-pay | Admitting: Internal Medicine

## 2015-04-27 ENCOUNTER — Encounter: Payer: Self-pay | Admitting: Physical Therapy

## 2015-04-27 VITALS — BP 130/80 | HR 79 | Temp 98.4°F | Resp 20 | Ht 69.0 in | Wt 175.0 lb

## 2015-04-27 DIAGNOSIS — G8190 Hemiplegia, unspecified affecting unspecified side: Secondary | ICD-10-CM

## 2015-04-27 DIAGNOSIS — N182 Chronic kidney disease, stage 2 (mild): Secondary | ICD-10-CM

## 2015-04-27 DIAGNOSIS — E139 Other specified diabetes mellitus without complications: Secondary | ICD-10-CM

## 2015-04-27 DIAGNOSIS — I10 Essential (primary) hypertension: Secondary | ICD-10-CM | POA: Diagnosis not present

## 2015-04-27 DIAGNOSIS — E1140 Type 2 diabetes mellitus with diabetic neuropathy, unspecified: Secondary | ICD-10-CM

## 2015-04-27 DIAGNOSIS — G8191 Hemiplegia, unspecified affecting right dominant side: Secondary | ICD-10-CM

## 2015-04-27 DIAGNOSIS — E1122 Type 2 diabetes mellitus with diabetic chronic kidney disease: Secondary | ICD-10-CM

## 2015-04-27 MED ORDER — ATORVASTATIN CALCIUM 10 MG PO TABS: 10.0000 mg | ORAL_TABLET | Freq: Every day | ORAL | Status: DC

## 2015-04-27 MED ORDER — INSULIN GLARGINE 100 UNIT/ML ~~LOC~~ SOLN: 24.0000 [IU] | Freq: Every day | SUBCUTANEOUS | Status: DC

## 2015-04-27 NOTE — Therapy (Signed)
[facility] [address] [place], [place], [address] Phone: [phone]   Fax:  [phone]  Physical Therapy Treatment  Patient Details  Name: Russell Austin MRN: [id number] Date of Birth: [birth year] Referring Provider:  Dorena Cookey, MD  Encounter Date: 04/26/2015      PT End of Session - 04/27/15 1444    Visit Number 12   Number of Visits 16   Date for PT Re-Evaluation 05/08/15   Authorization Type check benefits with insurance specialist to clarify*   PT Start Time 0846   PT Stop Time 0932   PT Time Calculation (min) 46 min      Past Medical History  Diagnosis Date  . Diabetes mellitus type II   . Hypertension   . ED (erectile dysfunction)   . Diabetic retinopathy FOLLOWED BY DR Zadie Rhine  . 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 Glaucoma of both eyes   . Left hydrocele     Past Surgical History  Procedure Laterality Date  . Undescended right testicle removed  1994  . Right eye vitrectomy/ insertion glaucoma seton/ laser repair  12-13-2008    RETINAL ARTERY OCCLUSION /NEOVASCULAR GLAUCOMA/ HEMORRHAGE  . Right eye vitretomy/ insertion glaucoma seton x2/ laser  03-24-2009    RECURRENT HEMORRHAGE/ OCCLUSION INTERNAL SETON  . Left eye laser retina repair  SEPT 2012  . Appendectomy  AGE EARLY 20'S  . 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 reports doing well - reports no new changes   Pertinent History diabetes with blindness R eye    Patient Stated Goals Be able to move freely (walk, pick up  things, drive); regain independence   Currently in Pain? No/denies                         [facility] Adult PT Treatment/Exercise - 04/27/15 0001    Transfers   Sit to Stand 5: Supervision   Sit to Stand Details --  L foot on balance bubble for incr. RLE weight-bearing   Number of Reps 10 reps   Ambulation/Gait   Ambulation/Gait Yes   Ambulation/Gait Assistance 5: Supervision   Ambulation Distance (Feet) 120 Feet   Assistive device None   Gait Pattern Step-through pattern;Right genu recurvatum  Decr. R push off due to weak plantarflexors   Ambulation Surface Level;Indoor   Neuro Re-ed    Neuro Re-ed Details  Tall kneeling on mat; R 1/2 kneeling for RLE stabilization and control - head turns added for perturbations - pt needed min assist to maintain position with red swiss ball in front for UE support prn     TherEx: R lateral trunk rotation stretch x 30 sec hold; R knee to chest and piriformis stretch x 30 sec hold each R hip abduction in sidelying x 10 reps; clam shell RLE in L sidelying x 10 reps R knee flexion prone 3# 2 sets 10 reps  R hip ext. With knee flexed at 90 degrees with min assist R step up exercise with tactile  cues to minimize R knee hyperextension x 2 sets 10 reps  NeuroRe-ed; R knee ext. Control with blue theraband - with L foot in bilateral stance, forward and backward stance x 10 reps each             PT Short Term Goals - 04/14/15 0854    PT SHORT TERM GOAL #1   Title The patient will verbalize understanding of stroke risk factors and warning signs.   Baseline edcuated and information given on 04/14/15.   Status Achieved   PT SHORT TERM GOAL #2   Title The patient will improve Berg score from 30/56 up to 38/56 to demo decreased risk for falls.   Baseline 04/06/15: 51/56 scored   Status Achieved   PT SHORT TERM GOAL #3   Title The patient will have gait further assessed and goal to follow (held today due to elvated BP).   Baseline Met on  03/16/2015 and new goal below.   Status Achieved   PT SHORT TERM GOAL #4   Title The patient will be able to negotiate his steps at home without sitting down to descend steps per report.   Baseline 04/06/15: pt reports using rail to walk up/down stairs at home now. demo in clinic: pt ascends with 1 rail fwd reciprocally, decends sideways with 1 rail step to pattern   Status Achieved   PT SHORT TERM GOAL #5   Title The patient will transfer sit<>stand and stand pivot modified indep.   Baseline Target date 04/08/2015 - met on 04/04/2015   Status Achieved   PT SHORT TERM GOAL #6   Title The patient will improve gait speed from 1.71 ft/sec up to 2.1 ft/sec to demo improving mobility.   Baseline 04/06/15: 1.57 ft/sec with hurry cane and min guard assist to supervision   Status Not Met   PT SHORT TERM GOAL #7   Title The patient will ambulate for household distances x 150 ft with CGA with least restrictive assistive device (baseline min A with SBQC).   Baseline 04/06/15: 240 feet today with hurry cane with min guard assist to supervision at times   Status Achieved           PT Long Term Goals - 04/04/15 6433    PT LONG TERM GOAL #1   Title The patient will be indep with HEP.   Baseline Target date 05/08/2015   Time 8   Period Weeks   Status On-going   PT LONG TERM GOAL #2   Title The patient will improve Berg from 30/56 up to > or equal to 42/56.   Baseline Target date 05/08/2015   Time 8   Period Weeks   Status On-going   PT LONG TERM GOAL #3   Title Ambulate x 300 ft with least restrictive assistive device and supervision for improved functional mobility in the home and limited community distances.   Baseline Target date 05/08/2015   Time 8   Period Weeks   Status On-going   PT LONG TERM GOAL #4   Title The patient will improve stair negotiate x 4 steps to supervision with step to pattern and one handrails.   Baseline Target date 05/08/2015   Time 8   Period Weeks   Status On-going   PT  LONG TERM GOAL #5   Title Improve SIS Mobility by 20% (baseline 41.7%).   Baseline Target date 05/08/2015   Time 8   Period Weeks   Status On-going   PT  LONG TERM GOAL #6   Title Improve gait speed from 1.71 ft/sec to > or equal to 2.4 ft/sec for improved functional ambulation.   Baseline Target date 05/08/2015   Time 8   Period Weeks   Status On-going   PT LONG TERM GOAL #7   Title The patient will decrease TUG from 19.63 seconds to < or equal to 15 seconds to demo improved functional mobility.   Baseline Target date 05/08/2015   Time 8   Period Weeks   Status New               Plan - 04/27/15 1445    Clinical Impression Statement Pt demonstrates gait deviaitions including R knee hyperextension and decr. push off in stance due to weak R plantarflexors and hamstrings; pt also has weakness in R hip extensors   Pt will benefit from skilled therapeutic intervention in order to improve on the following deficits Abnormal gait;Decreased activity tolerance;Decreased balance;Difficulty walking;Postural dysfunction;Impaired sensation;Decreased mobility;Decreased strength   Rehab Potential Good   Clinical Impairments Affecting Rehab Potential HTN   PT Frequency 2x / week   PT Duration 8 weeks   PT Treatment/Interventions ADLs/Self Care Home Management;Gait training;Neuromuscular re-education;Balance training;Therapeutic exercise;Therapeutic activities;Functional mobility training;Stair training;Patient/family education;Manual techniques   PT Next Visit Plan work on RLE strengthening - emphasis on plantarflexors, hip ext., and hamstring strengthening   PT Home Exercise Plan LE strengthening, stretching, general mobility   Consulted and Agree with Plan of Care Patient        Problem List Patient Active Problem List   Diagnosis Date Noted  . Dysphagia S/P CVA (cerebrovascular accident) 03/14/2015  . Dysphagia following cerebral infarction 02/15/2015  . Cerebral infarction due to  thrombosis of basilar artery   . CKD stage 2 due to type 2 diabetes mellitus 02/12/2015  . Right hemiparesis   . CVA (cerebral infarction) 02/10/2015  . CKD (chronic kidney disease) 02/10/2015  . Type 2 diabetes mellitus with diabetic neuropathy 02/10/2015  . Neck pain   . Numbness and tingling 02/09/2015  . Back pain, acute 10/30/2012  . Hematuria of undiagnosed cause 10/30/2012  . Proteinuria 10/30/2012  . Left shoulder pain 07/17/2012  . Plantar fasciitis, bilateral 07/17/2012  . HEADACHE 01/07/2009  . Peripheral neuropathy 11/06/2007  . ERECTILE DYSFUNCTION, MILD 11/06/2007  . 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 Essential hypertension 07/08/2007  . EXTRINSIC ASTHMA, UNSPECIFIED 06/25/2007    Alda Lea, PT 04/27/2015, 2:50 PM  [facility] [address] [place], [place], [address] Phone: [phone]   Fax:  [phone]

## 2015-04-27 NOTE — Patient Instructions (Signed)
Resume Lipitor  Limit your sodium (Salt) intake   Please check your hemoglobin A1c every 3 months

## 2015-04-27 NOTE — Progress Notes (Signed)
Subjective:    Patient ID: Russell Austin, male    DOB: 09-Jan-1951, 64 y.o.   MRN: [id number]  HPI Lab Results  Component Value Date   HGBA1C 7.5* 02/10/2015    BP Readings from Last 3 Encounters:  04/27/15 130/80  04/25/15 132/74  04/19/15 61/77    64 year old patient who is seen today for follow-up of diabetes.  He was hospitalized recently for a stroke.  He continues to receive outpatient PT with improvement of his right hemiparesis.  He has discontinued atorvastatin due to fear of side effects. Fasting blood sugars are occasionally less than 100.  He has resume metformin therapy He has had a recent eye examination  Generally feels well today  Past Medical History  Diagnosis Date  . Diabetes mellitus type II   . Hypertension   . ED (erectile dysfunction)   . Diabetic retinopathy FOLLOWED BY DR Zadie Rhine  . 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 Glaucoma of both eyes   . Left hydrocele     Social History   Social History  . Marital Status: Married    Spouse Name: N/A  . Number of Children: N/A  . Years of Education: N/A   Occupational History  . Not on file.   Social History Main Topics  . Smoking status: Never Smoker   . Smokeless tobacco: Never Used  . Alcohol Use: No  . Drug Use: No  . Sexual Activity: Not on file   Other Topics Concern  . Not on file   Social History Narrative    Past Surgical History  Procedure Laterality Date  . Undescended right testicle removed  1994  . Right eye vitrectomy/ insertion glaucoma seton/ laser repair  12-13-2008    RETINAL ARTERY OCCLUSION /NEOVASCULAR GLAUCOMA/ HEMORRHAGE  . Right eye vitretomy/ insertion glaucoma seton x2/ laser  03-24-2009    RECURRENT HEMORRHAGE/ OCCLUSION INTERNAL SETON  . Left eye laser retina repair  SEPT 2012  . Appendectomy  AGE EARLY 20'S  . Hydrocele excision  03/31/2012    Procedure: HYDROCELECTOMY ADULT;  Surgeon: Franchot Gallo, MD;  Location: [facility];  Service: Urology;  Laterality: Left;  11 MINS      Family History  Problem Relation Age of Onset  . Glaucoma Mother     Allergies  Allergen Reactions  . Lactose Intolerance (Gi) Diarrhea  . Apple Pectin [Pectin] Itching    ITCHY THROAT  . Peach [Prunus Persica] Itching    ITCHY THROAT    Current Outpatient Prescriptions on File Prior to Visit  Medication Sig Dispense Refill  . aspirin 325 MG tablet Take 1 tablet (325 mg total) by mouth daily. 30 tablet 0  . hydrochlorothiazide (HYDRODIURIL) 25 MG tablet Take 0.5 tablets (12.5 mg total) by mouth daily.    Marland Kitchen lisinopril (PRINIVIL,ZESTRIL) 40 MG tablet Take 40 mg by mouth daily.    Marland Kitchen losartan (COZAAR) 25 MG tablet Take 1 tablet (25 mg total) by mouth daily. 30 tablet 0  . metFORMIN (GLUCOPHAGE) 1000 MG tablet Take 1 tablet (1,000 mg total) by mouth 2 (two) times daily with a meal. 180 tablet 3  . senna (SENOKOT) 8.6 MG tablet Take 1 tablet by mouth daily.    Marland Kitchen tiZANidine (ZANAFLEX) 2 MG tablet Take 1 tablet (2 mg total) by mouth 4 (four) times daily. Take 1 tablet twice a day and take 2 tablets at night 120 tablet 1   No current facility-administered medications on file  prior to visit.    BP 130/80 mmHg  Pulse 79  Temp(Src) 98.4 F (36.9 C) (Oral)  Resp 20  Ht 5\' 9"  (1.753 m)  Wt 175 lb (79.379 kg)  BMI 25.83 kg/m2  SpO2 98%     Review of Systems  Constitutional: Negative for fever, chills, appetite change and fatigue.  HENT: Negative for congestion, dental problem, ear pain, hearing loss, sore throat, tinnitus, trouble swallowing and voice change.   Eyes: Negative for pain, discharge and visual disturbance.  Respiratory: Negative for cough, chest tightness, wheezing and stridor.   Cardiovascular: Negative for chest pain, palpitations and leg swelling.  Gastrointestinal: Negative for nausea, vomiting, abdominal pain, diarrhea, constipation, blood in stool and abdominal distention.  Genitourinary:  Negative for urgency, hematuria, flank pain, discharge, difficulty urinating and genital sores.  Musculoskeletal: Negative for myalgias, back pain, joint swelling, arthralgias, gait problem and neck stiffness.  Skin: Negative for rash.  Neurological: Positive for weakness. Negative for dizziness, syncope, speech difficulty, numbness and headaches.  Hematological: Negative for adenopathy. Does not bruise/bleed easily.  Psychiatric/Behavioral: Negative for behavioral problems and dysphoric mood. The patient is not nervous/anxious.        Objective:   Physical Exam  Constitutional: He is oriented to person, place, and time. He appears well-developed.  Blood pressure 122/80  HENT:  Head: Normocephalic.  Right Ear: External ear normal.  Left Ear: External ear normal.  Eyes: Conjunctivae and EOM are normal.  Blind right eye  Neck: Normal range of motion.  Cardiovascular: Normal rate and normal heart sounds.   Pulmonary/Chest: Breath sounds normal.  Abdominal: Bowel sounds are normal.  Musculoskeletal: Normal range of motion. He exhibits no edema or tenderness.  Neurological: He is alert and oriented to person, place, and time.  Improving right sided weakness  Psychiatric: He has a normal mood and affect. His behavior is normal.          Assessment & Plan:   Diabetes mellitus.  Metformin therapy has been resumed.  Fasting blood sugars are frequently less than 100.  We'll continue present regimen.  Recheck hemoglobin A1c in 2 months Hypertension, stable Status post CVA.  Continue physical therapy  Statin therapy resumed

## 2015-04-27 NOTE — Progress Notes (Signed)
Pre visit review using our clinic review tool, if applicable. No additional management support is needed unless otherwise documented below in the visit note. 

## 2015-05-02 ENCOUNTER — Encounter: Payer: Self-pay | Admitting: Physical Medicine & Rehabilitation

## 2015-05-02 ENCOUNTER — Ambulatory Visit (HOSPITAL_BASED_OUTPATIENT_CLINIC_OR_DEPARTMENT_OTHER): Payer: BC Managed Care – PPO | Admitting: Physical Medicine & Rehabilitation

## 2015-05-02 ENCOUNTER — Encounter: Payer: BC Managed Care – PPO | Attending: Physical Medicine & Rehabilitation

## 2015-05-02 VITALS — BP 157/81 | HR 81

## 2015-05-02 DIAGNOSIS — R200 Anesthesia of skin: Secondary | ICD-10-CM | POA: Insufficient documentation

## 2015-05-02 DIAGNOSIS — R2689 Other abnormalities of gait and mobility: Secondary | ICD-10-CM | POA: Insufficient documentation

## 2015-05-02 DIAGNOSIS — G8190 Hemiplegia, unspecified affecting unspecified side: Secondary | ICD-10-CM

## 2015-05-02 DIAGNOSIS — H409 Unspecified glaucoma: Secondary | ICD-10-CM | POA: Diagnosis not present

## 2015-05-02 DIAGNOSIS — E11319 Type 2 diabetes mellitus with unspecified diabetic retinopathy without macular edema: Secondary | ICD-10-CM | POA: Insufficient documentation

## 2015-05-02 DIAGNOSIS — I10 Essential (primary) hypertension: Secondary | ICD-10-CM | POA: Insufficient documentation

## 2015-05-02 DIAGNOSIS — R4702 Dysphasia: Secondary | ICD-10-CM | POA: Insufficient documentation

## 2015-05-02 DIAGNOSIS — I6302 Cerebral infarction due to thrombosis of basilar artery: Secondary | ICD-10-CM

## 2015-05-02 DIAGNOSIS — G8191 Hemiplegia, unspecified affecting right dominant side: Secondary | ICD-10-CM

## 2015-05-02 MED ORDER — TIZANIDINE HCL 2 MG PO TABS: 2.0000 mg | ORAL_TABLET | Freq: Four times a day (QID) | ORAL | Status: DC | PRN

## 2015-05-02 NOTE — Progress Notes (Signed)
Subjective:    Patient ID: Russell Austin, male    DOB: 10-10-1950, 64 y.o.   MRN: [id number]  HPI   Right Arm tightness, spasms/tightness Received Botox while in the hospital to the right pectoralis, brachial radialis and bicep In June, 2016 Having increasing spasms currently Sleeps in a chair Also has spasms in the right leg causing his knee to bend. Does not note problems with this during the day Spasms persist despite physical therapy occupational therapy and taking tizanidine  Patient wants to drive, Wife doesn't think he is ready for this. 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 General activity 0 Relation with others 0 Enjoyment of life 0 What TIME of day is your pain at its worst? night Sleep (in general) NA  Pain is worse with: no pain Pain improves with: no pain Relief from Meds: no pain  Mobility use a cane ability to climb steps?  yes do you drive?  no  Function disabled: date disabled .  Neuro/Psych spasms  Prior Studies Any changes since last visit?  no  Physicians involved in your care Any changes since last visit?  no   Family History  Problem Relation Age of Onset  . Glaucoma Mother    Social History   Social History  . Marital Status: Married    Spouse Name: N/A  . Number of Children: N/A  . Years of Education: N/A   Social History Main Topics  . Smoking status: Never Smoker   . Smokeless tobacco: Never Used  . Alcohol Use: No  . Drug Use: No  . Sexual Activity: Not Asked   Other Topics Concern  . None   Social History Narrative   Past Surgical History  Procedure Laterality Date  . Undescended right testicle removed  1994  . Right eye vitrectomy/ insertion glaucoma seton/ laser repair  12-13-2008    RETINAL ARTERY OCCLUSION /NEOVASCULAR GLAUCOMA/  HEMORRHAGE  . Right eye vitretomy/ insertion glaucoma seton x2/ laser  03-24-2009    RECURRENT HEMORRHAGE/ OCCLUSION INTERNAL SETON  . Left eye laser retina repair  SEPT 2012  . Appendectomy  AGE EARLY 20'S  . Hydrocele excision  03/31/2012    Procedure: HYDROCELECTOMY ADULT;  Surgeon: Franchot Gallo, MD;  Location: [facility];  Service: Urology;  Laterality: Left;  54 MINS     Past Medical History  Diagnosis Date  . Diabetes mellitus type II   . Hypertension   . ED (erectile dysfunction)   . Diabetic retinopathy FOLLOWED BY DR Zadie Rhine  . 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 Glaucoma of both eyes   . Left hydrocele    BP 157/81 mmHg  Pulse 81  SpO2 100%  Opioid Risk Score:   Fall Risk Score:  `1  Depression screen PHQ 2/9  Depression screen [facility] 2/9 05/02/2015 03/29/2015  Decreased Interest 0 0  Down, Depressed, Hopeless 0 0  PHQ - 2 Score 0 0      Review of Systems  Musculoskeletal:       Spasms  All other systems reviewed and are negative.      Objective:   Physical Exam  Constitutional: He is oriented to person, place, and time. He appears well-developed and well-nourished.  HENT:  Head: Normocephalic and  atraumatic.  Eyes: Conjunctivae and EOM are normal. Pupils are equal, round, and reactive to light.  Neck: Normal range of motion.  Neurological: He is alert and oriented to person, place, and time. No sensory deficit. Coordination and gait abnormal.  Motor strength is 4 minus/5 in the right deltoid, biceps, triceps, grip, hip flexion, knee extension, ankle dorsiflexion Right sided tone modified Ashworth of 2-3 in the biceps brachial radialis, 2 at the finger flexors wrist flexors, 2 at the hamstring and quadricep  Decreased fine motor right finger to thumb able to oppose index and middle only  Ambulates with a quad cane, Some knee hyperextension noted toe drag   Psychiatric: He has a normal mood and affect.  Nursing  note and vitals reviewed.         Assessment & Plan:  1. 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 Making good functional recovery is independent with his ADLs and modified independent with ambulation using a cane. Spasticity is becoming more problematic once again. This is likely secondary to the botulinum toxin wearing off. Would repeat next month and use Dysport to hopefully increase duration of response.  In the meantime, add 1 extra tablet of tizanidine per day he did either take 6 mg at night or take 4 mg at night and then 2 mg if he wakes up with spasms. Discussed with patient and his wife agree with plan What over spasticity including causation, treatments  Went over driving at this point I do not think he is ready for this due to decreased coordination on the right side. Will reassess in one month.  Over half of the 25 min visit was spent counseling and coordinating care.

## 2015-05-02 NOTE — Patient Instructions (Signed)
NO DRIVING YET.

## 2015-05-03 ENCOUNTER — Telehealth: Payer: Self-pay | Admitting: *Deleted

## 2015-05-03 ENCOUNTER — Ambulatory Visit: Payer: BC Managed Care – PPO | Admitting: Rehabilitative and Restorative Service Providers"

## 2015-05-03 ENCOUNTER — Encounter: Payer: Self-pay | Admitting: Occupational Therapy

## 2015-05-03 ENCOUNTER — Ambulatory Visit: Payer: BC Managed Care – PPO | Admitting: Occupational Therapy

## 2015-05-03 DIAGNOSIS — M6281 Muscle weakness (generalized): Secondary | ICD-10-CM

## 2015-05-03 DIAGNOSIS — IMO0002 Reserved for concepts with insufficient information to code with codable children: Secondary | ICD-10-CM

## 2015-05-03 DIAGNOSIS — I69359 Hemiplegia and hemiparesis following cerebral infarction affecting unspecified side: Secondary | ICD-10-CM

## 2015-05-03 DIAGNOSIS — I6980 Unspecified sequelae of other cerebrovascular disease: Secondary | ICD-10-CM | POA: Diagnosis not present

## 2015-05-03 DIAGNOSIS — R269 Unspecified abnormalities of gait and mobility: Secondary | ICD-10-CM

## 2015-05-03 NOTE — Therapy (Signed)
[facility] [address] [place], [place], [address] Phone: [phone]   Fax:  [phone]  Physical Therapy Treatment  Patient Details  Name: Russell Austin MRN: [id number] Date of Birth: 11/15/1950 Referring Provider:  Dorena Cookey, MD  Encounter Date: 05/03/2015      PT End of Session - 05/03/15 0900    Visit Number 13   Number of Visits 16   Date for PT Re-Evaluation 05/08/15   Authorization Type check benefits with insurance specialist to clarify*   PT Start Time 0808   PT Stop Time 0848   PT Time Calculation (min) 40 min   Equipment Utilized During Treatment Gait belt   Activity Tolerance Patient tolerated treatment well   Behavior During Therapy [facility] for tasks assessed/performed      Past Medical History  Diagnosis Date  . Diabetes mellitus type II   . Hypertension   . ED (erectile dysfunction)   . Diabetic retinopathy FOLLOWED BY DR Zadie Rhine  . 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 Glaucoma of both eyes   . Left hydrocele     Past Surgical History  Procedure Laterality Date  . Undescended right testicle removed  1994  . Right eye vitrectomy/ insertion glaucoma seton/ laser repair  12-13-2008    RETINAL ARTERY OCCLUSION /NEOVASCULAR GLAUCOMA/ HEMORRHAGE  . Right eye vitretomy/ insertion glaucoma seton x2/ laser  03-24-2009    RECURRENT HEMORRHAGE/ OCCLUSION INTERNAL SETON  . Left eye laser retina repair  SEPT 2012  . Appendectomy  AGE EARLY 20'S  . Hydrocele excision  03/31/2012    Procedure: HYDROCELECTOMY ADULT;  Surgeon: Franchot Gallo, MD;  Location: [facility];  Service: Urology;  Laterality: Left;  45 MINS      There were no vitals filed for this visit.  Visit Diagnosis:  Abnormality of gait  Lack of coordination due to stroke  Generalized muscle weakness      Subjective Assessment - 05/03/15 0808    Subjective Patient reports he is  going to get more botox.  He had an MD appt yesterday.   Currently in Pain? No/denies       Gait: Ambulation x 400 feet without device with cues on R UE and trunk posturing, R knee flexion at initial swing with R push off at terminal stance x 3 repetitions with tactile cues for improved mechanics + verbal cues.  Treadmill x 2.5 minutes at 1.0-1.1 mph with 0-2% incline with tactile cuing posterior R knee for flexion at initial swing phase of gait with bilateral UE support  Backwards walking, marching and sidestepping emphasizing R knee control during all activities with min A due to imbalance with dynamic activities.  THERAPEUTIC EXERCISE: Standing heel raises R single limb stance x 10 repetitions with cues on R knee control Wall squats with isometric holds with L LE on compliant surface to increase R weight shifting standing hamstring stretch (modified down dog position with UEs on counter) x 3 reps with hip adductor stretch in same position Thomas test R hip flexor stretch Supine R hamstring stretch  NEUROMUSCULAR RE-EDUCATION: Seated on physioball with marching and LE extension with emphasis on trunk elongation Seated rocking ant/posterior with emphasis on ankle movements (on physioball) STanding weight shifting ant/post with emphasis on knee and ankle control with min A and tactile cues Standing hip extension leaning over physioball with facilitation of hip/knee position for pre-gait terminal stance activities  PT Short Term Goals - 04/14/15 0854    PT SHORT TERM GOAL #1   Title The patient will verbalize understanding of stroke risk factors and warning signs.   Baseline edcuated and information given on 04/14/15.   Status Achieved   PT SHORT TERM GOAL #2   Title The patient will improve Berg score from 30/56 up to 38/56 to demo decreased risk for falls.   Baseline 04/06/15: 51/56 scored   Status Achieved   PT SHORT TERM GOAL #3   Title The patient will have gait further  assessed and goal to follow (held today due to elvated BP).   Baseline Met on 03/16/2015 and new goal below.   Status Achieved   PT SHORT TERM GOAL #4   Title The patient will be able to negotiate his steps at home without sitting down to descend steps per report.   Baseline 04/06/15: pt reports using rail to walk up/down stairs at home now. demo in clinic: pt ascends with 1 rail fwd reciprocally, decends sideways with 1 rail step to pattern   Status Achieved   PT SHORT TERM GOAL #5   Title The patient will transfer sit<>stand and stand pivot modified indep.   Baseline Target date 04/08/2015 - met on 04/04/2015   Status Achieved   PT SHORT TERM GOAL #6   Title The patient will improve gait speed from 1.71 ft/sec up to 2.1 ft/sec to demo improving mobility.   Baseline 04/06/15: 1.57 ft/sec with hurry cane and min guard assist to supervision   Status Not Met   PT SHORT TERM GOAL #7   Title The patient will ambulate for household distances x 150 ft with CGA with least restrictive assistive device (baseline min A with SBQC).   Baseline 04/06/15: 240 feet today with hurry cane with min guard assist to supervision at times   Status Achieved           PT Long Term Goals - 04/04/15 7564    PT LONG TERM GOAL #1   Title The patient will be indep with HEP.   Baseline Target date 05/08/2015   Time 8   Period Weeks   Status On-going   PT LONG TERM GOAL #2   Title The patient will improve Berg from 30/56 up to > or equal to 42/56.   Baseline Target date 05/08/2015   Time 8   Period Weeks   Status On-going   PT LONG TERM GOAL #3   Title Ambulate x 300 ft with least restrictive assistive device and supervision for improved functional mobility in the home and limited community distances.   Baseline Target date 05/08/2015   Time 8   Period Weeks   Status On-going   PT LONG TERM GOAL #4   Title The patient will improve stair negotiate x 4 steps to supervision with step to pattern and one handrails.    Baseline Target date 05/08/2015   Time 8   Period Weeks   Status On-going   PT LONG TERM GOAL #5   Title Improve SIS Mobility by 20% (baseline 41.7%).   Baseline Target date 05/08/2015   Time 8   Period Weeks   Status On-going   PT LONG TERM GOAL #6   Title Improve gait speed from 1.71 ft/sec to > or equal to 2.4 ft/sec for improved functional ambulation.   Baseline Target date 05/08/2015   Time 8   Period Weeks   Status On-going   PT LONG TERM  GOAL #7   Title The patient will decrease TUG from 19.63 seconds to < or equal to 15 seconds to demo improved functional mobility.   Baseline Target date 05/08/2015   Time 8   Period Weeks   Status New               Plan - 05/03/15 0901    Clinical Impression Statement The patient is continuing to demonstrate improvement in gait mechanics with PT focus on R push-off to initial swing mechanics of gait.  The patient continues with increased muscle tone that is hindering coordination of movement.   PT Next Visit Plan work on RLE strengthening - emphasis on plantarflexors, hip ext., and hamstring strengthening   Consulted and Agree with Plan of Care Patient        Problem List Patient Active Problem List   Diagnosis Date Noted  . Dysphagia S/P CVA (cerebrovascular accident) 03/14/2015  . Dysphagia following cerebral infarction 02/15/2015  . Cerebral infarction due to thrombosis of basilar artery   . CKD stage 2 due to type 2 diabetes mellitus 02/12/2015  . Right hemiparesis   . CVA (cerebral infarction) 02/10/2015  . CKD (chronic kidney disease) 02/10/2015  . Type 2 diabetes mellitus with diabetic neuropathy 02/10/2015  . Neck pain   . Numbness and tingling 02/09/2015  . Back pain, acute 10/30/2012  . Hematuria of undiagnosed cause 10/30/2012  . Proteinuria 10/30/2012  . Left shoulder pain 07/17/2012  . Plantar fasciitis, bilateral 07/17/2012  . HEADACHE 01/07/2009  . Peripheral neuropathy 11/06/2007  . ERECTILE DYSFUNCTION,  MILD 11/06/2007  . 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 Essential hypertension 07/08/2007  . EXTRINSIC ASTHMA, UNSPECIFIED 06/25/2007    Swati Granberry, PT 05/03/2015, 9:02 AM  [facility] [address] [place], [place], [address] Phone: [phone]   Fax:  [phone]

## 2015-05-03 NOTE — Therapy (Signed)
[facility] [address] [place], [place], [address] Phone: [phone]   Fax:  [phone]  Occupational Therapy Treatment  Patient Details  Name: Russell Austin MRN: [id number] Date of Birth: March 15, 1951 Referring Provider:  Dorena Cookey, MD  Encounter Date: 05/03/2015      OT End of Session - 05/03/15 1413    Visit Number 10   Number of Visits 17   Date for OT Re-Evaluation 05/10/15   Authorization Type [employer] PPO, no visit limit/auth   OT Start Time 0845   OT Stop Time 0930   OT Time Calculation (min) 45 min   Equipment Utilized During Treatment gait belt, BP cuff   Activity Tolerance Patient tolerated treatment well      Past Medical History  Diagnosis Date  . Diabetes mellitus type II   . Hypertension   . ED (erectile dysfunction)   . Diabetic retinopathy FOLLOWED BY DR Zadie Rhine  . 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 Glaucoma of both eyes   . Left hydrocele     Past Surgical History  Procedure Laterality Date  . Undescended right testicle removed  1994  . Right eye vitrectomy/ insertion glaucoma seton/ laser repair  12-13-2008    RETINAL ARTERY OCCLUSION /NEOVASCULAR GLAUCOMA/ HEMORRHAGE  . Right eye vitretomy/ insertion glaucoma seton x2/ laser  03-24-2009    RECURRENT HEMORRHAGE/ OCCLUSION INTERNAL SETON  . Left eye laser retina repair  SEPT 2012  . Appendectomy  AGE EARLY 20'S  . Hydrocele excision  03/31/2012    Procedure: HYDROCELECTOMY ADULT;  Surgeon: Franchot Gallo, MD;  Location: [facility];  Service: Urology;  Laterality: Left;  45 MINS      There were no vitals filed for this visit.  Visit Diagnosis:  Lack of coordination due to stroke  Generalized muscle weakness  Hemiparesis affecting nondominant side as late effect of cerebrovascular accident      Subjective Assessment - 05/03/15 0846    Subjective  My arm gets loose after wt.  bearing, but it gets tight again   Pertinent History cerebral infarct 02/09/15, diabetic retinopathy with Rt eye legally blind, DM   Patient Stated Goals to return to cooking, cleaning and use of RT arm   Currently in Pain? No/denies                      OT Treatments/Exercises (OP) - 05/03/15 0001    ADLs   ADL Comments Re-assessed ongoing STG's #3, #4, #5. See STG'S for progress   Exercises   Exercises --  UBE X 8 min. Level 3 for reciprocal mvmt pattern   Neurological Re-education Exercises   Other Exercises 1 Supine: bilateral shoulder flexion with weighted ball. Progressed to doing in seated.    Other Weight-Bearing Exercises 1 Seated: wt bearing through BUE's in shoulder ER and extension with modifications for wrist pain. Progressed to wt bearing over RUE only during sit-squat activity, then with trunk rotation during reaching with LUE contralaterally and ipsilaterally   Other Weight-Bearing Exercises 2 Supine: propped on elbows for scapula retraction and depression bilaterally followed by RUE only while reaching LUE                  OT Short Term Goals - 05/03/15 0930    OT SHORT TERM GOAL #1   Title Independent w/ initial HEP for RUE (DUE 04/09/15)   Time 4   Period Weeks  Status Achieved   OT SHORT TERM GOAL #2   Title Independent w/ edema management strategies for Rt hand   Time 4   Period Weeks   Status Achieved   OT SHORT TERM GOAL #3   Title Improve RUE functional use as demo by performing 22 or > blocks on Box & Blocks test   Baseline 15   Time 4   Period Weeks   Status Achieved  04/06/15 = 17. 05/03/15 = 26 blocks   OT SHORT TERM GOAL #4   Title Pt to increase grip strength to 35 lbs or greater Rt hand to assist with opening containers   Baseline 22 lbs   Time 4   Period Weeks   Status Not Met  04/14/15:  not met- 23lbs. 05/03/15: 17 lbs   OT SHORT TERM GOAL #5   Title Pt to demo 125* shoulder flexion to retrieve/replace lightweight object  on high shelf with min compensations and no pain   Baseline 110-120* with moderate compensations into IR and forearm pronation, occasional pain from spasms   Time 4   Period Weeks   Status Achieved  04/14/15:  Not met. 120* with min compensation (with min cueing). 05/03/15: pt able to retrieve/replace light objects at 125* with min compensations           OT Long Term Goals - 03/10/15 1034    OT LONG TERM GOAL #1   Title Independent w/ updated HEP prn (due 05/10/15)   Time 8   Period Weeks   Status New   OT LONG TERM GOAL #2   Title Improve coordination Rt hand as evidenced by performing 9 hole peg test in under 2 minutes   Baseline eval = unable   Time 8   Period Weeks   Status New   OT LONG TERM GOAL #3   Title Improve RUE function as evidenced by performing 30 or > blocks on Box & Blocks test   Baseline eval = 15   Time 8   Period Weeks   Status New   OT LONG TERM GOAL #4   Title Pt to perform simple stovetop cooking task at mod I level safely   Baseline dependent   Time 8   Period Weeks   Status New   OT LONG TERM GOAL #5   Title Pt to perform light household cleaning tasks at mod I level safely   Baseline dependent    Time 8   Period Weeks   Status New               Plan - 05/03/15 1414    Clinical Impression Statement Pt met STG's # 3 and #5 today with improved use RUE. However, pt had slight decline in grip strength   Plan continue NMR, functional use RUE   OT Home Exercise Plan CVA warning signs and risk factors, edema management techniques, initial HEP 03/16/15. (Verbally added in hand manipulation ex 04/04/15, scapula retraction ex 04/06/15)   Consulted and Agree with Plan of Care Patient        Problem List Patient Active Problem List   Diagnosis Date Noted  . Dysphagia S/P CVA (cerebrovascular accident) 03/14/2015  . Dysphagia following cerebral infarction 02/15/2015  . Cerebral infarction due to thrombosis of basilar artery   . CKD stage 2 due  to type 2 diabetes mellitus 02/12/2015  . Right hemiparesis   . CVA (cerebral infarction) 02/10/2015  . CKD (chronic kidney disease) 02/10/2015  . Type  2 diabetes mellitus with diabetic neuropathy 02/10/2015  . Neck pain   . Numbness and tingling 02/09/2015  . Back pain, acute 10/30/2012  . Hematuria of undiagnosed cause 10/30/2012  . Proteinuria 10/30/2012  . Left shoulder pain 07/17/2012  . Plantar fasciitis, bilateral 07/17/2012  . HEADACHE 01/07/2009  . Peripheral neuropathy 11/06/2007  . ERECTILE DYSFUNCTION, MILD 11/06/2007  . 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 Essential hypertension 07/08/2007  . EXTRINSIC ASTHMA, UNSPECIFIED 06/25/2007    Carey Bullocks, OTR/L 05/03/2015, 2:16 PM  [facility] [address] [place], [place], [address] Phone: [phone]   Fax:  [phone]

## 2015-05-03 NOTE — Telephone Encounter (Signed)
Russell Austin called asking to speak with Dr Letta Pate.  I called him back and clarified what the call was about.  He would like to speak wih Dr Letta Pate because no one has ever told him why he had the stroke and he wants to discuss something with Dr Letta Pate.  Please give him a call. [phone]

## 2015-05-05 ENCOUNTER — Ambulatory Visit: Payer: BC Managed Care – PPO | Admitting: Occupational Therapy

## 2015-05-05 ENCOUNTER — Encounter: Payer: Self-pay | Admitting: Occupational Therapy

## 2015-05-05 ENCOUNTER — Ambulatory Visit
Payer: BC Managed Care – PPO | Attending: Physical Medicine & Rehabilitation | Admitting: Rehabilitative and Restorative Service Providers"

## 2015-05-05 DIAGNOSIS — M6281 Muscle weakness (generalized): Secondary | ICD-10-CM | POA: Insufficient documentation

## 2015-05-05 DIAGNOSIS — R269 Unspecified abnormalities of gait and mobility: Secondary | ICD-10-CM | POA: Diagnosis not present

## 2015-05-05 DIAGNOSIS — I69359 Hemiplegia and hemiparesis following cerebral infarction affecting unspecified side: Secondary | ICD-10-CM

## 2015-05-05 DIAGNOSIS — I69859 Hemiplegia and hemiparesis following other cerebrovascular disease affecting unspecified side: Secondary | ICD-10-CM | POA: Insufficient documentation

## 2015-05-05 NOTE — Patient Instructions (Signed)
1. Scapular: Retraction in External Rotation   With hands clasped behind head, elbows up, pull elbows back, pinching shoulder blades together. Repeat _10___ times per set holding out position.  Do __2__ sessions per day.  2. Scapular Retraction (Prone)   Lie with arms at sides. Pinch shoulder blades together and raise shoulders a few inches from floor. Repeat _10-15___ times per set, holding 5 sec.  Do _2___ sessions per day.   3. Weight bearing through arms while seated. Place arms slightly behind you and stick out chest, squeezing shoulder blades together, hold 30 sec. Or more

## 2015-05-05 NOTE — Therapy (Signed)
[facility] [address] [place], [place], [address] Phone: [phone]   Fax:  [phone]  Occupational Therapy Treatment  Patient Details  Name: Russell Austin MRN: [id number] Date of Birth: [birth year] Referring Provider:  Dorena Cookey, MD  Encounter Date: 05/05/2015      OT End of Session - 05/05/15 0900    Visit Number 11   Number of Visits 17   Date for OT Re-Evaluation 05/10/15   Authorization Type [employer] PPO, no visit limit/auth   OT Start Time 0800   OT Stop Time 0845   OT Time Calculation (min) 45 min   Activity Tolerance Patient tolerated treatment well      Past Medical History  Diagnosis Date  . Diabetes mellitus type II   . Hypertension   . ED (erectile dysfunction)   . Diabetic retinopathy FOLLOWED BY DR Zadie Rhine  . 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 Glaucoma of both eyes   . Left hydrocele     Past Surgical History  Procedure Laterality Date  . Undescended right testicle removed  1994  . Right eye vitrectomy/ insertion glaucoma seton/ laser repair  12-13-2008    RETINAL ARTERY OCCLUSION /NEOVASCULAR GLAUCOMA/ HEMORRHAGE  . Right eye vitretomy/ insertion glaucoma seton x2/ laser  03-24-2009    RECURRENT HEMORRHAGE/ OCCLUSION INTERNAL SETON  . Left eye laser retina repair  SEPT 2012  . Appendectomy  AGE EARLY 20'S  . Hydrocele excision  03/31/2012    Procedure: HYDROCELECTOMY ADULT;  Surgeon: Franchot Gallo, MD;  Location: [facility];  Service: Urology;  Laterality: Left;  45 MINS      There were no vitals filed for this visit.  Visit Diagnosis:  Hemiparesis affecting nondominant side as late effect of cerebrovascular accident      Subjective Assessment - 05/05/15 0801    Subjective  My arm feels so tight today. I'm getting botox sometime this month   Pertinent History cerebral infarct 02/09/15, diabetic retinopathy with Rt eye legally  blind, DM   Patient Stated Goals to return to cooking, cleaning and use of RT arm   Currently in Pain? Yes   Pain Score 7    Pain Location Shoulder  not joint, but at pects attachment d/t spasticity   Pain Orientation Right   Pain Descriptors / Indicators Tightness   Pain Type Acute pain   Pain Onset Today   Pain Frequency Intermittent   Aggravating Factors  mostly at pects attachment with ER stretch   Pain Relieving Factors rest, repositioning                      OT Treatments/Exercises (OP) - 05/05/15 0001    ADLs   ADL Comments Discussed botox and provided handout on recommended muscles to botox including: pectoralis major, teres major, pronators, wrist flexors and possibly biceps. Pt to see Dr. Letta Pate later this month for botox   Neurological Re-education Exercises   Other Exercises 1 Prone: scapula retraction with min tactile cues Rt x 10, holding 5 sec. Prone: sh. extension RUE over EOB with min assist to prevent IR.    Other Weight-Bearing Exercises 1 Seated: wt bearing through BUE's in shoulder extension and ER    Manual Therapy   Manual Therapy Passive ROM   Passive ROM Supine: pects stretch with shoulder abduction and ER with pain up to 7/10 d/t spasticity in pects. Pt also shown self pects stretch in standing  at wall                OT Education - 05/05/15 0839    Education provided Yes   Education Details ER/pects stretch supine, scapula retraction prone, wt.bearing BUE's seated HEP   Person(s) Educated Patient   Methods Explanation;Demonstration;Handout   Comprehension Verbalized understanding;Returned demonstration          OT Short Term Goals - 05/03/15 0930    OT SHORT TERM GOAL #1   Title Independent w/ initial HEP for RUE (DUE 04/09/15)   Time 4   Period Weeks   Status Achieved   OT SHORT TERM GOAL #2   Title Independent w/ edema management strategies for Rt hand   Time 4   Period Weeks   Status Achieved   OT SHORT TERM GOAL  #3   Title Improve RUE functional use as demo by performing 22 or > blocks on Box & Blocks test   Baseline 15   Time 4   Period Weeks   Status Achieved  04/06/15 = 17. 05/03/15 = 26 blocks   OT SHORT TERM GOAL #4   Title Pt to increase grip strength to 35 lbs or greater Rt hand to assist with opening containers   Baseline 22 lbs   Time 4   Period Weeks   Status Not Met  04/14/15:  not met- 23lbs. 05/03/15: 17 lbs   OT SHORT TERM GOAL #5   Title Pt to demo 125* shoulder flexion to retrieve/replace lightweight object on high shelf with min compensations and no pain   Baseline 110-120* with moderate compensations into IR and forearm pronation, occasional pain from spasms   Time 4   Period Weeks   Status Achieved  04/14/15:  Not met. 120* with min compensation (with min cueing). 05/03/15: pt able to retrieve/replace light objects at 125* with min compensations           OT Long Term Goals - 03/10/15 1034    OT LONG TERM GOAL #1   Title Independent w/ updated HEP prn (due 05/10/15)   Time 8   Period Weeks   Status New   OT LONG TERM GOAL #2   Title Improve coordination Rt hand as evidenced by performing 9 hole peg test in under 2 minutes   Baseline eval = unable   Time 8   Period Weeks   Status New   OT LONG TERM GOAL #3   Title Improve RUE function as evidenced by performing 30 or > blocks on Box & Blocks test   Baseline eval = 15   Time 8   Period Weeks   Status New   OT LONG TERM GOAL #4   Title Pt to perform simple stovetop cooking task at mod I level safely   Baseline dependent   Time 8   Period Weeks   Status New   OT LONG TERM GOAL #5   Title Pt to perform light household cleaning tasks at mod I level safely   Baseline dependent    Time 8   Period Weeks   Status New               Plan - 05/05/15 0900    Clinical Impression Statement Pt with increased tightness in RUE d/t spasticity which caused some pain during stretching. Pt with noted tightness in pects  with shoulder abduction and ER stretch. P.T. also noted upper trap and levator scapulae tightness.    Plan Continue stretches for  pects, NMR, functional use RUE. Start assessing LTG's and plan on renewal   OT Home Exercise Plan CVA warning signs and risk factors, edema management techniques, initial HEP 03/16/15. (Verbally added in hand manipulation ex 04/04/15, scapula retraction ex 04/06/15). Additional HEP 05/05/15   Consulted and Agree with Plan of Care Patient        Problem List Patient Active Problem List   Diagnosis Date Noted  . Dysphagia S/P CVA (cerebrovascular accident) 03/14/2015  . Dysphagia following cerebral infarction 02/15/2015  . Cerebral infarction due to thrombosis of basilar artery   . CKD stage 2 due to type 2 diabetes mellitus 02/12/2015  . Right hemiparesis   . CVA (cerebral infarction) 02/10/2015  . CKD (chronic kidney disease) 02/10/2015  . Type 2 diabetes mellitus with diabetic neuropathy 02/10/2015  . Neck pain   . Numbness and tingling 02/09/2015  . Back pain, acute 10/30/2012  . Hematuria of undiagnosed cause 10/30/2012  . Proteinuria 10/30/2012  . Left shoulder pain 07/17/2012  . Plantar fasciitis, bilateral 07/17/2012  . HEADACHE 01/07/2009  . Peripheral neuropathy 11/06/2007  . ERECTILE DYSFUNCTION, MILD 11/06/2007  . 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 Essential hypertension 07/08/2007  . EXTRINSIC ASTHMA, UNSPECIFIED 06/25/2007    Carey Bullocks, OTR/L 05/05/2015, 9:04 AM  [facility] [address] [place], [place], [address] Phone: [phone]   Fax:  [phone]

## 2015-05-05 NOTE — Therapy (Signed)
[facility] [address] [place], [place], [address] Phone: [phone]   Fax:  [phone]  Physical Therapy Treatment  Patient Details  Name: Russell Austin MRN: [id number] Date of Birth: 12/04/1950 Referring Provider:  Dorena Cookey, MD  Encounter Date: 05/05/2015      PT End of Session - 05/05/15 1219    Visit Number 14   Number of Visits 16   Date for PT Re-Evaluation 05/08/15   Authorization Type private insurance   PT Start Time 0848   PT Stop Time 0935   PT Time Calculation (min) 47 min   Activity Tolerance Patient tolerated treatment well   Behavior During Therapy [facility] for tasks assessed/performed      Past Medical History  Diagnosis Date  . Diabetes mellitus type II   . Hypertension   . ED (erectile dysfunction)   . Diabetic retinopathy FOLLOWED BY DR Zadie Rhine  . 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 Glaucoma of both eyes   . Left hydrocele     Past Surgical History  Procedure Laterality Date  . Undescended right testicle removed  1994  . Right eye vitrectomy/ insertion glaucoma seton/ laser repair  12-13-2008    RETINAL ARTERY OCCLUSION /NEOVASCULAR GLAUCOMA/ HEMORRHAGE  . Right eye vitretomy/ insertion glaucoma seton x2/ laser  03-24-2009    RECURRENT HEMORRHAGE/ OCCLUSION INTERNAL SETON  . Left eye laser retina repair  SEPT 2012  . Appendectomy  AGE EARLY 20'S  . Hydrocele excision  03/31/2012    Procedure: HYDROCELECTOMY ADULT;  Surgeon: Franchot Gallo, MD;  Location: [facility];  Service: Urology;  Laterality: Left;  45 MINS      There were no vitals filed for this visit.  Visit Diagnosis:  Abnormality of gait  Generalized muscle weakness      Subjective Assessment - 05/05/15 0852    Subjective The patient reports he is more tight today than he has been since he left the hospital.  He states "I haven't felt this bad in a long time".   Patient had OT and they worked on stretching through chest muscles.   Patient Stated Goals Be able to move freely (walk, pick up things, drive); regain independence   Currently in Pain? No/denies  "not pain, just real tight"            [place] Adult PT Treatment/Exercise - 05/05/15 0854    Ambulation/Gait   Ambulation/Gait Yes   Ambulation/Gait Assistance 5: Supervision  with occasional tactile cues   Ambulation/Gait Assistance Details added pool noodle behind UEs to cue ER, trunk tactile cues for elongation   Ambulation Distance (Feet) 230 Feet  x 3 reps   Gait Pattern Step-through pattern  decreased R push off with decreased knee flexion   High Level Balance   High Level Balance Comments Right LE weight bearing moving L LE into flexion, abudction and extension with cues on R hip stabilization with R UE weight bearing with assist   Neuro Re-ed    Neuro Re-ed Details  --  Scapular mobility to help with posture/trunk position c gait   Manual Therapy   Manual Therapy Neural Stretch;Soft tissue mobilization;Manual Traction   Manual therapy comments patient with significant tightness noted in R upper trapezius, R scalenes, R suboccipitals.  OT addressed pec and upper arm   Soft tissue mobilization R upper trap, scalenes   Passive ROM neck ROM with passive overpressure   Manual Traction gentle traction  for muscle release   Neural Stretch R UE to patient comfort            PT Short Term Goals - 04/14/15 0854    PT SHORT TERM GOAL #1   Title The patient will verbalize understanding of stroke risk factors and warning signs.   Baseline edcuated and information given on 04/14/15.   Status Achieved   PT SHORT TERM GOAL #2   Title The patient will improve Berg score from 30/56 up to 38/56 to demo decreased risk for falls.   Baseline 04/06/15: 51/56 scored   Status Achieved   PT SHORT TERM GOAL #3   Title The patient will have gait further assessed and goal to follow (held today due to  elvated BP).   Baseline Met on 03/16/2015 and new goal below.   Status Achieved   PT SHORT TERM GOAL #4   Title The patient will be able to negotiate his steps at home without sitting down to descend steps per report.   Baseline 04/06/15: pt reports using rail to walk up/down stairs at home now. demo in clinic: pt ascends with 1 rail fwd reciprocally, decends sideways with 1 rail step to pattern   Status Achieved   PT SHORT TERM GOAL #5   Title The patient will transfer sit<>stand and stand pivot modified indep.   Baseline Target date 04/08/2015 - met on 04/04/2015   Status Achieved   PT SHORT TERM GOAL #6   Title The patient will improve gait speed from 1.71 ft/sec up to 2.1 ft/sec to demo improving mobility.   Baseline 04/06/15: 1.57 ft/sec with hurry cane and min guard assist to supervision   Status Not Met   PT SHORT TERM GOAL #7   Title The patient will ambulate for household distances x 150 ft with CGA with least restrictive assistive device (baseline min A with SBQC).   Baseline 04/06/15: 240 feet today with hurry cane with min guard assist to supervision at times   Status Achieved           PT Long Term Goals - 04/04/15 8250    PT LONG TERM GOAL #1   Title The patient will be indep with HEP.   Baseline Target date 05/08/2015   Time 8   Period Weeks   Status On-going   PT LONG TERM GOAL #2   Title The patient will improve Berg from 30/56 up to > or equal to 42/56.   Baseline Target date 05/08/2015   Time 8   Period Weeks   Status On-going   PT LONG TERM GOAL #3   Title Ambulate x 300 ft with least restrictive assistive device and supervision for improved functional mobility in the home and limited community distances.   Baseline Target date 05/08/2015   Time 8   Period Weeks   Status On-going   PT LONG TERM GOAL #4   Title The patient will improve stair negotiate x 4 steps to supervision with step to pattern and one handrails.   Baseline Target date 05/08/2015   Time 8   Period  Weeks   Status On-going   PT LONG TERM GOAL #5   Title Improve SIS Mobility by 20% (baseline 41.7%).   Baseline Target date 05/08/2015   Time 8   Period Weeks   Status On-going   PT LONG TERM GOAL #6   Title Improve gait speed from 1.71 ft/sec to > or equal to 2.4 ft/sec for improved functional ambulation.  Baseline Target date 05/08/2015   Time 8   Period Weeks   Status On-going   PT LONG TERM GOAL #7   Title The patient will decrease TUG from 19.63 seconds to < or equal to 15 seconds to demo improved functional mobility.   Baseline Target date 05/08/2015   Time 8   Period Weeks   Status New               Plan - 05/05/15 1221    Clinical Impression Statement The patient is c/o worsening tightness in R UE/neck, shoulder, elbow, wrist that is hindering ability to sleep, and comfort.  PT added manual stretching of neck and soft tissue work in the neck to treatment.  Patient's posture during giat (flexed looking at ground to ensure he clears R foot) is most likely one contributing factor to current symptoms + muscle tone s/p CVA.   Pt will benefit from skilled therapeutic intervention in order to improve on the following deficits Abnormal gait;Decreased activity tolerance;Decreased balance;Difficulty walking;Postural dysfunction;Impaired sensation;Decreased mobility;Decreased strength   Rehab Potential Good   PT Frequency 2x / week   PT Duration 8 weeks   PT Treatment/Interventions ADLs/Self Care Home Management;Gait training;Neuromuscular re-education;Balance training;Therapeutic exercise;Therapeutic activities;Functional mobility training;Stair training;Patient/family education;Manual techniques   PT Next Visit Plan Check LTGs and renew next visit.   Consulted and Agree with Plan of Care Patient        Problem List Patient Active Problem List   Diagnosis Date Noted  . Dysphagia S/P CVA (cerebrovascular accident) 03/14/2015  . Dysphagia following cerebral infarction  02/15/2015  . Cerebral infarction due to thrombosis of basilar artery   . CKD stage 2 due to type 2 diabetes mellitus 02/12/2015  . Right hemiparesis   . CVA (cerebral infarction) 02/10/2015  . CKD (chronic kidney disease) 02/10/2015  . Type 2 diabetes mellitus with diabetic neuropathy 02/10/2015  . Neck pain   . Numbness and tingling 02/09/2015  . Back pain, acute 10/30/2012  . Hematuria of undiagnosed cause 10/30/2012  . Proteinuria 10/30/2012  . Left shoulder pain 07/17/2012  . Plantar fasciitis, bilateral 07/17/2012  . HEADACHE 01/07/2009  . Peripheral neuropathy 11/06/2007  . ERECTILE DYSFUNCTION, MILD 11/06/2007  . 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 Essential hypertension 07/08/2007  . EXTRINSIC ASTHMA, UNSPECIFIED 06/25/2007    Dreyson Mishkin, PT 05/05/2015, 12:52 PM  [facility] [address] [place], [place], [address] Phone: [phone]   Fax:  [phone]

## 2015-05-16 ENCOUNTER — Encounter: Payer: BC Managed Care – PPO | Attending: Physical Medicine & Rehabilitation

## 2015-05-16 ENCOUNTER — Encounter: Payer: Self-pay | Admitting: Physical Medicine & Rehabilitation

## 2015-05-16 ENCOUNTER — Ambulatory Visit (HOSPITAL_BASED_OUTPATIENT_CLINIC_OR_DEPARTMENT_OTHER): Payer: BC Managed Care – PPO | Admitting: Physical Medicine & Rehabilitation

## 2015-05-16 VITALS — BP 160/76 | HR 78

## 2015-05-16 DIAGNOSIS — I10 Essential (primary) hypertension: Secondary | ICD-10-CM | POA: Diagnosis not present

## 2015-05-16 DIAGNOSIS — E11319 Type 2 diabetes mellitus with unspecified diabetic retinopathy without macular edema: Secondary | ICD-10-CM | POA: Diagnosis not present

## 2015-05-16 DIAGNOSIS — G8110 Spastic hemiplegia affecting unspecified side: Secondary | ICD-10-CM

## 2015-05-16 DIAGNOSIS — R4702 Dysphasia: Secondary | ICD-10-CM | POA: Diagnosis not present

## 2015-05-16 DIAGNOSIS — G90511 Complex regional pain syndrome I of right upper limb: Secondary | ICD-10-CM

## 2015-05-16 DIAGNOSIS — R200 Anesthesia of skin: Secondary | ICD-10-CM | POA: Diagnosis not present

## 2015-05-16 DIAGNOSIS — H409 Unspecified glaucoma: Secondary | ICD-10-CM | POA: Insufficient documentation

## 2015-05-16 DIAGNOSIS — R2689 Other abnormalities of gait and mobility: Secondary | ICD-10-CM | POA: Diagnosis not present

## 2015-05-16 MED ORDER — METHYLPREDNISOLONE 4 MG PO TBPK: ORAL_TABLET | ORAL | Status: DC

## 2015-05-16 NOTE — Patient Instructions (Signed)
The botulinum toxin injections should start working in about a week. No therapy restrictions. Should last for 12-14 weeks I will see her in 6 weeks to follow up   The other condition we talked about in regards to your hand is called complex regional pain syndrome. It occurs in about 10% of patients after stroke and can cause hand pain as well as shoulder pain. The treatment is a course of Corticosteroid tablets. This may increase her blood sugar

## 2015-05-16 NOTE — Progress Notes (Signed)
Dysport Injection for spasticity using needle EMG guidance  Dilution: 200 Units/ml Indication: Severe spasticity which interferes with ADL,mobility and/or  hygiene and is unresponsive to medication management and other conservative care Informed consent was obtained after describing risks and benefits of the procedure with the patient. This includes bleeding, bruising, infection, excessive weakness, or medication side effects. A REMS form is on file and signed. Needle: 2" 25g needle electrode Number of units per muscle Pectoralis400U Biceps200U Brachialis200U Brachioradialis 100U All injections were done after obtaining appropriate EMG activity and after negative drawback for blood. The patient tolerated the procedure well. Post procedure instructions were given. A followup appointment was made.   Patient also having issues with right hand swelling and wishes to talk to me about this aside from the injection Also complaining of right hand pain. Increased swelling. No trauma to that area. This pain keeps him up at night He has been trying to elevate and massage the hand. The hand is hypersensitive. Some shoulder tightness and some shoulder pain as well. No previous nerve injury in the right hand.  Examination Gen. No acute distress Mood and affect are appropriate Right upper extremity 3 minus at the deltoid, biceps, triceps, grip Ashworth score is 3 at the right pectoralis, right elbow flexors Ashworth scale 2 at the finger and wrist flexors  Right hand has swelling no evidence of erythema. No joint Synovitis appreciated, Mild pain with MCP compression. Paresthesias with brushing the skin. 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 This is stroke related. He is finished PT still does and home exercise program as well as elevation and retrograde massage. Should continue that. We'll start Medrol  Dosepak

## 2015-05-17 ENCOUNTER — Encounter: Payer: Self-pay | Admitting: Neurology

## 2015-05-17 ENCOUNTER — Ambulatory Visit (INDEPENDENT_AMBULATORY_CARE_PROVIDER_SITE_OTHER): Payer: BC Managed Care – PPO | Admitting: Neurology

## 2015-05-17 VITALS — BP 153/85 | HR 89 | Ht 69.0 in | Wt 179.0 lb

## 2015-05-17 DIAGNOSIS — G8110 Spastic hemiplegia affecting unspecified side: Secondary | ICD-10-CM | POA: Diagnosis not present

## 2015-05-17 DIAGNOSIS — G8111 Spastic hemiplegia affecting right dominant side: Secondary | ICD-10-CM

## 2015-05-17 DIAGNOSIS — I639 Cerebral infarction, unspecified: Secondary | ICD-10-CM

## 2015-05-17 NOTE — Progress Notes (Signed)
PATIENT: Russell Austin DOB: 24-Apr-1951  Chief Complaint  Patient presents with  . Cerebrovascular Accident    He is here with his wife, Mila [place], for an evaluation for his stroke in June 2016.  He has completed his PT, OT and speech therapy.  He still has significant weakness and limited use of his right side.  He ambulates with the assistance of a cane.  He denies any memory loss.     HISTORICAL  Russell Austin is a 64 years old left-handed male, is here with his wife Mila Homer, primary care physician is Dr. 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 Elnita Maxwell May 16 2015, EMG guided Botox injection for right upper extremity spasticity, will continue follow-up with Dr. Elnita Maxwell in October. He has right hand swelling, right upper extremity spastic discomfort, He was diagnosed with type ii  regional complex syndrome, was given Medrol pack   I have reviewed extensive hospital evaluation, personally reviewed CAT scan, and MRIs.   Mra Head Wo Contrast 02/09/2015: Intracranial atherosclerotic change MRI of brain February 09 2015: Acute LEFT paramedian 5 x 10 x 10 mm. lower brainstem infarction. This is nonhemorrhagic.Mild supratentorium small vessel disease   Mr Cervical Spine Wo Contrast:  Straightened cervical lordosis without acute fracture nor malalignment. Degenerative change of the cervical spine resulting  in mild to moderate canal stenosis at C4-5, mild at C5-6 and C6-7. Neural foraminal narrowing C3-4 thru C6-7: Moderate to severe on the LEFT at C4-5 and C6-7. Asymmetric scalene muscles: Fused RIGHT middle and posterior slips versus atrophic posterior sludge.   Carotid Doppler Bilateral: 1-39% ICA stenosis. Vertebral artery flow is antegrade.   2D Echocardiogram: Ejection fraction 60%, no regional wall motion abnormality, no cardiac embolic source identified  EKG: Normal sinus rhythm  Laboratory evaluations, A1c 7.6, LDL 76    REVIEW OF SYSTEMS: Full 14 system review of systems performed and notable only for fatigue, depression, insomnia  ALLERGIES: Allergies  Allergen Reactions  . Lactose Intolerance (Gi) Diarrhea  . Apple Pectin [Pectin] Itching    ITCHY THROAT  . Peach [Prunus Persica] Itching    ITCHY THROAT    HOME MEDICATIONS: Current Outpatient Prescriptions  Medication Sig Dispense Refill  . aspirin 325 MG tablet Take 1 tablet (325 mg total) by mouth daily. 30 tablet 0  . atorvastatin (LIPITOR) 10 MG tablet Take 1 tablet (10 mg total) by mouth daily at 6 PM. 90 tablet 3  . hydrochlorothiazide (HYDRODIURIL) 25 MG tablet Take 0.5 tablets (12.5 mg total) by mouth daily.    . insulin glargine (LANTUS) 100 UNIT/ML injection Inject 0.24 mLs (24 Units total) into the skin at bedtime. 10 mL 11  . lisinopril (PRINIVIL,ZESTRIL) 40 MG tablet Take 40 mg by mouth daily.    Marland Kitchen losartan (COZAAR) 25 MG tablet Take 1 tablet (25 mg total) by mouth daily. 30 tablet 0  . metFORMIN (GLUCOPHAGE) 1000 MG tablet Take 1 tablet (  1,000 mg total) by mouth 2 (two) times daily with a meal. 180 tablet 3  . methylPREDNISolone (MEDROL DOSEPAK) 4 MG TBPK tablet follow package directions 21 tablet 0  . senna (SENOKOT) 8.6 MG tablet Take 1 tablet by mouth daily.    Marland Kitchen tiZANidine (ZANAFLEX) 2 MG tablet Take 1 tablet (2 mg total) by mouth every 6 (six) hours as needed for muscle spasms. Take 1 tablet twice a  day and take 2 tablets at night 150 tablet 1     PAST MEDICAL HISTORY: Past Medical History  Diagnosis Date  . Diabetes mellitus type II   . Hypertension   . ED (erectile dysfunction)   . Diabetic retinopathy FOLLOWED BY DR Zadie Rhine  . 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 Glaucoma of both eyes   . Left hydrocele   . Stroke     PAST SURGICAL HISTORY: Past Surgical History  Procedure Laterality Date  . Undescended right testicle removed  1994  . Right eye vitrectomy/ insertion glaucoma seton/ laser repair  12-13-2008    RETINAL ARTERY OCCLUSION /NEOVASCULAR GLAUCOMA/ HEMORRHAGE  . Right eye vitretomy/ insertion glaucoma seton x2/ laser  03-24-2009    RECURRENT HEMORRHAGE/ OCCLUSION INTERNAL SETON  . Left eye laser retina repair  SEPT 2012  . Appendectomy  AGE EARLY 20'S  . Hydrocele excision  03/31/2012    Procedure: HYDROCELECTOMY ADULT;  Surgeon: Franchot Gallo, MD;  Location: [facility];  Service: Urology;  Laterality: Left;  36 MINS      FAMILY HISTORY: Family History  Problem Relation Age of Onset  . Glaucoma Mother   . Diabetes Mother   . Stomach cancer Maternal Grandfather   . Stroke Maternal Grandmother     SOCIAL HISTORY:  Social History   Social History  . Marital Status: Married    Spouse Name: N/A  . Number of Children: 1  . Years of Education: 12   Occupational History  . Disabled    Social History Main Topics  . Smoking status: Never Smoker   . Smokeless tobacco: Never Used  . Alcohol Use: No  . Drug Use: No  . Sexual Activity: Not on file   Other Topics Concern  . Not on file   Social History Narrative   Lives at home with his wife and granddaughter.   Left-handed.   3 cups caffeine per day.     PHYSICAL EXAM   Filed Vitals:   05/17/15 0755  BP: 153/85  Pulse: 89  Height: 5\' 9"  (1.753 m)  Weight: 179 lb (81.194 kg)    Not recorded      Body mass index is 26.42 kg/(m^2).  PHYSICAL  EXAMNIATION:  Gen: NAD, conversant, well nourised, obese, well groomed                     Cardiovascular: Regular rate rhythm, no peripheral edema, warm, nontender. Eyes: Conjunctivae clear without exudates or hemorrhage Neck: Supple, no carotid bruise. Pulmonary: Clear to auscultation bilaterally   NEUROLOGICAL EXAM:  MENTAL STATUS: Speech:    Speech is normal; fluent and spontaneous with normal comprehension.  Cognition:     Orientation to time, place and person     Normal recent and remote memory     Normal Attention span and concentration     Normal Language, naming, repeating,spontaneous speech     Fund of knowledge   CRANIAL NERVES: CN II: Right eyeball atrophy, opaque. Left pupil is briskly reactive  to light. CN III, IV, VI: extraocular movement are normal. No ptosis. CN V: Facial sensation is intact to pinprick in all 3 divisions bilaterally. Corneal responses are intact.  CN VII: Face is symmetric with normal eye closure and smile. CN VIII: Hearing is normal to rubbing fingers CN IX, X: Palate elevates symmetrically. Phonation is normal. CN XI: Head turning and shoulder shrug are intact CN XII: Tongue is midline with normal movements and no atrophy.  MOTOR: Spastic right hemiparesis, limited range of motion of right shoulder, tendency for right elbow mild flexion, pronation, motor strength is 5 minus, moderate spasticity of right lower extremity, mild right hip flexion weakness, tendency for right ankle plantarflexion   REFLEXES:  Hyperreflexia on the right side, with right-sided Babinski signs. SENSORY: Intact to light touch, pinprick, position sense, and vibration sense are intact in fingers and toes. With exception of right hand swollen, allodynia  COORDINATION: There is no left finger to nose, heel-to-shin dysmetria, difficulty on the right side performance due to spastic right hemiparesis  GAIT/STANCE: Spastic right hemi-circumferential gait, mildly  unsteady  DIAGNOSTIC DATA (LABS, IMAGING, TESTING) - I reviewed patient records, labs, notes, testing and imaging myself where available.   ASSESSMENT AND PLAN  NATHAN PINSON is a 64 y.o. male with multiple vascular risk factors, hypertension, diabetes, presented with acute left pontine stroke in February 09 2015, with residual spastic right hemiparesis,  Left pontine stroke  Continue to address vascular risk factor, optimize blood pressure control, less than 130 over 80, LDL less than 70, A1c less than 7   Continue aspirin 325 mg daily Spastic right hemiparesis:   Continue moderate exercise,  EMG guided Botox injection by Dr. Elnita Maxwell   return to clinic for new issues      Marcial Pacas, M.D. Ph.D.  [facility] [address], [place], [place] [address] Ph: [phone] Fax: [phone]  CC: Dr. Stevie Kern

## 2015-05-20 ENCOUNTER — Other Ambulatory Visit (INDEPENDENT_AMBULATORY_CARE_PROVIDER_SITE_OTHER): Payer: BC Managed Care – PPO

## 2015-05-20 DIAGNOSIS — E139 Other specified diabetes mellitus without complications: Secondary | ICD-10-CM

## 2015-05-20 LAB — BASIC METABOLIC PANEL
BUN: 24 mg/dL — ABNORMAL HIGH (ref 6–23)
CO2: 29 meq/L (ref 19–32)
Calcium: 9.1 mg/dL (ref 8.4–10.5)
Chloride: 105 mEq/L (ref 96–112)
Creatinine, Ser: 1.29 mg/dL (ref 0.40–1.50)
GFR: 72.18 mL/min (ref >60.00)
Glucose, Bld: 110 mg/dL — ABNORMAL HIGH (ref 70–99)
Potassium: 4.1 mEq/L (ref 3.5–5.1)
Sodium: 141 mEq/L (ref 135–145)

## 2015-05-20 LAB — HEMOGLOBIN A1C: HEMOGLOBIN A1C: 7.4 % — AB (ref 4.6–6.5)

## 2015-05-25 ENCOUNTER — Encounter: Payer: Self-pay | Admitting: Family Medicine

## 2015-05-25 ENCOUNTER — Ambulatory Visit (INDEPENDENT_AMBULATORY_CARE_PROVIDER_SITE_OTHER): Payer: BC Managed Care – PPO | Admitting: Family Medicine

## 2015-05-25 VITALS — BP 160/80 | Temp 98.0°F | Wt 179.0 lb

## 2015-05-25 DIAGNOSIS — I10 Essential (primary) hypertension: Secondary | ICD-10-CM

## 2015-05-25 DIAGNOSIS — E139 Other specified diabetes mellitus without complications: Secondary | ICD-10-CM | POA: Diagnosis not present

## 2015-05-25 MED ORDER — LOSARTAN POTASSIUM-HCTZ 100-12.5 MG PO TABS: 1.0000 | ORAL_TABLET | Freq: Every day | ORAL | Status: DC

## 2015-05-25 NOTE — Progress Notes (Signed)
Pre visit review using our clinic review tool, if applicable. No additional management support is needed unless otherwise documented below in the visit note. 

## 2015-05-25 NOTE — Progress Notes (Signed)
   Subjective:    Patient ID: Russell Austin, male    DOB: Aug 29, 1951, 64 y.o.   MRN: [id number]  HPI Russell Austin is a 64 year old married male nonsmoker who comes in today for follow-up of diabetes and hypertension  He's currently on Lantus 24 units daily along with metformin 1000 mg twice a day. Blood sugar this morning at home was 91. Blood sugar here 5 days ago was 110 with an A1c of 7.4%.  BP is 160/80 on Hydrocort thiazide one half tablet daily, lisinopril 40 mg daily  He's not monitoring his blood pressure at home  He did have a stroke in June he is recovering is going to the gym daily.  Last physical exam here April 2016   Review of Systems    review of systems otherwise negative Objective:   Physical Exam Well-developed well-nourished male no acute distress vital signs stable is afebrile       Assessment & Plan:  Diabetes type 2........ almost at goal....... continue diet exercise.... Increase insulin to 26 units daily return April 2016 CPX  Hypertension question at goal.......... BP check at home follow-up in 2 weeks if not at goal

## 2015-05-25 NOTE — Patient Instructions (Signed)
Stop the Cozaar and the lisinopril  Hyzaar 100-12 0.5............ one tablet daily in the morning  Omron pump up digital blood pressure cuff......... check your blood pressure daily in the morning  Return in 3 weeks for follow-up...........Marland Kitchen bring all your data and the device when you come back for follow-up  Increase your insulin to 26 units daily

## 2015-06-06 ENCOUNTER — Telehealth: Payer: Self-pay | Admitting: Physical Medicine & Rehabilitation

## 2015-06-06 NOTE — Telephone Encounter (Signed)
Please advise 

## 2015-06-06 NOTE — Telephone Encounter (Signed)
Medication that was given to him for his hand pain is not working.  Would like to try something else.  Please call patient.

## 2015-06-06 NOTE — Telephone Encounter (Signed)
Call in tramadol 50 mg 3 times a day #90, one refill

## 2015-06-07 MED ORDER — TRAMADOL HCL 50 MG PO TABS: 50.0000 mg | ORAL_TABLET | Freq: Three times a day (TID) | ORAL | Status: DC | PRN

## 2015-06-07 NOTE — Telephone Encounter (Signed)
Please call into [facility] on [address]

## 2015-06-07 NOTE — Addendum Note (Signed)
Addended by: Caro Hight on: 06/07/2015 02:05 PM   Modules accepted: Orders

## 2015-06-07 NOTE — Telephone Encounter (Signed)
done

## 2015-06-10 ENCOUNTER — Telehealth: Payer: Self-pay | Admitting: Physical Medicine & Rehabilitation

## 2015-06-10 DIAGNOSIS — G90511 Complex regional pain syndrome I of right upper limb: Secondary | ICD-10-CM

## 2015-06-10 NOTE — Telephone Encounter (Signed)
Patient started tramadol yesterday and woke up this morning and his whole right side is stiff.  Wants to know if this is supposed to happen on this medication.  Please call patient.

## 2015-06-10 NOTE — Telephone Encounter (Signed)
I spoke with Russell Austin to verify this and his right side is stiff and "it wasn't like that before the tramadol".

## 2015-06-10 NOTE — Telephone Encounter (Signed)
Patient has severe spasticity which causes the stiffness it is not related to tramadol

## 2015-06-14 NOTE — Telephone Encounter (Signed)
Attempted to call patient, left a voicemail to have patient call us back, was not able to leave a detailed message as the voicemail was not private

## 2015-06-15 ENCOUNTER — Telehealth: Payer: Self-pay | Admitting: *Deleted

## 2015-06-15 ENCOUNTER — Encounter: Payer: Self-pay | Admitting: Family Medicine

## 2015-06-15 ENCOUNTER — Ambulatory Visit (INDEPENDENT_AMBULATORY_CARE_PROVIDER_SITE_OTHER): Payer: BC Managed Care – PPO | Admitting: Family Medicine

## 2015-06-15 VITALS — BP 143/69 | HR 68 | Temp 97.5°F | Wt 177.9 lb

## 2015-06-15 DIAGNOSIS — Z23 Encounter for immunization: Secondary | ICD-10-CM | POA: Diagnosis not present

## 2015-06-15 DIAGNOSIS — E139 Other specified diabetes mellitus without complications: Secondary | ICD-10-CM | POA: Diagnosis not present

## 2015-06-15 DIAGNOSIS — I10 Essential (primary) hypertension: Secondary | ICD-10-CM | POA: Diagnosis not present

## 2015-06-15 MED ORDER — AMLODIPINE BESYLATE 2.5 MG PO TABS: 2.5000 mg | ORAL_TABLET | Freq: Every day | ORAL | Status: DC

## 2015-06-15 MED ORDER — INSULIN GLARGINE 100 UNIT/ML ~~LOC~~ SOLN: 24.0000 [IU] | Freq: Every day | SUBCUTANEOUS | Status: DC

## 2015-06-15 NOTE — Telephone Encounter (Signed)
Contacted pt, he says that following his dysport injection, his hand loosened up in a week and then came back after a week had passed.  Hence, he is wondering about his stiffness. He is also concerned about his right being swollen and was under the impression that tramadol was supposed to relieve the situation. It has not according to the patient. Is there anything he can take to help with swelling in his hand?  He has an upcoming appt on 06/27/2015

## 2015-06-15 NOTE — Progress Notes (Signed)
   Subjective:    Patient ID: Russell Austin, male    DOB: 1951-01-07, 64 y.o.   MRN: [id number]  HPI Russell Austin is a 64 year old male who comes in today for follow-up of hypertension diabetes  His blood pressure is averaging 1 140/90 at home. He is on losartan 100 mg-12.5 daily. We'll add a small dose of Norvasc  He tells me his insurance company is no longer going to cover the Lantus insulin. We'll set him up but [facility] for his insulin since it's cheaper   Review of Systems Review of systems otherwise negative    Objective:   Physical Exam Well-developed well-nourished male no acute distress vital signs stable is afebrile BP right arm sitting position 140/96       Assessment & Plan:  Hypertension not at goal........ add Norvasc 2.5 mg daily follow-up in 6 weeks

## 2015-06-15 NOTE — Patient Instructions (Signed)
Take the insulin prescription to [facility] outpatient [facility] on [address] next to the hospital  Add Norvasc 2.5 mg......... one tablet daily in the morning  Check your blood pressure daily in the morning  Keep a record of all your blood pressure readings on a piece of paper  Return in 6 weeks for follow-up

## 2015-06-16 NOTE — Telephone Encounter (Signed)
We can refer him for a stellate ganglion block which is a nerve block in the neck. Otherwise he'll need to elevate the arm and massage the fingers toward the wrist 3 times per day. Also an Isotoner glove can be helpful this is an elastic glove that provide some compression and can be purchased at a drugstore

## 2015-06-16 NOTE — Addendum Note (Signed)
Addended by: Caro Hight on: 06/16/2015 11:42 AM   Modules accepted: Orders

## 2015-06-16 NOTE — Telephone Encounter (Signed)
Diagnosis is reflex sympathetic dystrophy upper limb

## 2015-06-16 NOTE — Addendum Note (Signed)
Addended by: Caro Hight on: 06/16/2015 04:25 PM   Modules accepted: Orders

## 2015-06-16 NOTE — Telephone Encounter (Signed)
Block would be done by Dr Isaiah Blakes per Dr Letta Pate. He would like to proceed.  He is elevating the arm and I gave him instructions on massage and glove.

## 2015-06-20 ENCOUNTER — Telehealth: Payer: Self-pay | Admitting: Physical Medicine & Rehabilitation

## 2015-06-20 NOTE — Telephone Encounter (Signed)
Wants to know status of referral that Dr. Letta Pate has set up for him.  Please call [phone].

## 2015-06-23 NOTE — Telephone Encounter (Signed)
error 

## 2015-06-27 ENCOUNTER — Encounter: Payer: BC Managed Care – PPO | Attending: Physical Medicine & Rehabilitation

## 2015-06-27 ENCOUNTER — Ambulatory Visit (HOSPITAL_BASED_OUTPATIENT_CLINIC_OR_DEPARTMENT_OTHER): Payer: BC Managed Care – PPO | Admitting: Physical Medicine & Rehabilitation

## 2015-06-27 ENCOUNTER — Encounter: Payer: Self-pay | Admitting: Physical Medicine & Rehabilitation

## 2015-06-27 VITALS — BP 160/79 | HR 89 | Resp 14

## 2015-06-27 DIAGNOSIS — G8110 Spastic hemiplegia affecting unspecified side: Secondary | ICD-10-CM

## 2015-06-27 DIAGNOSIS — E11319 Type 2 diabetes mellitus with unspecified diabetic retinopathy without macular edema: Secondary | ICD-10-CM | POA: Diagnosis not present

## 2015-06-27 DIAGNOSIS — R4702 Dysphasia: Secondary | ICD-10-CM | POA: Diagnosis not present

## 2015-06-27 DIAGNOSIS — H409 Unspecified glaucoma: Secondary | ICD-10-CM | POA: Insufficient documentation

## 2015-06-27 DIAGNOSIS — G90511 Complex regional pain syndrome I of right upper limb: Secondary | ICD-10-CM | POA: Diagnosis not present

## 2015-06-27 DIAGNOSIS — I10 Essential (primary) hypertension: Secondary | ICD-10-CM | POA: Diagnosis not present

## 2015-06-27 DIAGNOSIS — R2689 Other abnormalities of gait and mobility: Secondary | ICD-10-CM | POA: Diagnosis not present

## 2015-06-27 DIAGNOSIS — G90519 Complex regional pain syndrome I of unspecified upper limb: Secondary | ICD-10-CM | POA: Insufficient documentation

## 2015-06-27 DIAGNOSIS — R200 Anesthesia of skin: Secondary | ICD-10-CM | POA: Insufficient documentation

## 2015-06-27 NOTE — Progress Notes (Signed)
Subjective:    Patient ID: Russell Austin, male    DOB: [birth year], 64 y.o.   MRN: [id number] 64 y.o. male with history of DM type 2 with retinopathy, HTN, glaucoma, who was admitted on 02/09/15 with reports of heaviness of RUE/RLE with intermittent numbness, pins and needles since waking that am. He underwent colonoscopy that am and after procedure was noted to have difficulty walking as well as difficulty swallowing while in ED. 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 HPI Chief complaint is stiffness and pain around the fingers and knuckles. Also has some at the wrist. Patient states that overall his arm pain is better not as much around the forearm or arm area still has some stiffness around the shoulder. Has an appointment with Dr. Barbette Hair for possible stellate ganglion block on November 8 Taking tramadol 1 tablet in the morning and 2 tablets at night Taking tizanidine on an as-needed basis, usually about 1 tablet per day  Patient underwent Dysport injection to the right pectoralis, right brachialis and right biceps as well as brachial radialis  Reviewed neurology note Pain Inventory Average Pain 4 Pain Right Now 4 My pain is sharp  In the last 24 hours, has pain interfered with the following? General activity 0 Relation with others 0 Enjoyment of life 1 What TIME of day is your pain at its worst? night Sleep (in general) Fair  Pain is worse with: unsure Pain improves with: na Relief from Meds: na  Mobility walk with assistance use a cane ability to climb steps?  yes do you drive?  no  Function disabled: date disabled .  Neuro/Psych spasms  Prior Studies Any changes since last visit?  no  Physicians involved in your care Any changes since last visit?  no   Family History  Problem Relation Age of Onset  . Glaucoma Mother   . Diabetes Mother   . Stomach cancer  Maternal Grandfather   . Stroke Maternal Grandmother    Social History   Social History  . Marital Status: Married    Spouse Name: N/A  . Number of Children: 1  . Years of Education: 12   Occupational History  . Disabled    Social History Main Topics  . Smoking status: Never Smoker   . Smokeless tobacco: Never Used  . Alcohol Use: No  . Drug Use: No  . Sexual Activity: Not Asked   Other Topics Concern  . None   Social History Narrative   Lives at home with his wife and granddaughter.   Left-handed.   3 cups caffeine per day.   Past Surgical History  Procedure Laterality Date  . Undescended right testicle removed  1994  . Right eye vitrectomy/ insertion glaucoma seton/ laser repair  12-13-2008    RETINAL ARTERY OCCLUSION /NEOVASCULAR GLAUCOMA/ HEMORRHAGE  . Right eye vitretomy/ insertion glaucoma seton x2/ laser  03-24-2009    RECURRENT HEMORRHAGE/ OCCLUSION INTERNAL SETON  . Left eye laser retina repair  SEPT 2012  . Appendectomy  AGE EARLY 20'S  . Hydrocele excision  03/31/2012    Procedure: HYDROCELECTOMY ADULT;  Surgeon: Franchot Gallo, MD;  [facility];  Service: Urology;  Laterality: Left;  72 MINS     Past Medical History  Diagnosis Date  . Diabetes mellitus type II   . Hypertension   . ED (erectile dysfunction)   . Diabetic retinopathy FOLLOWED BY DR Zadie Rhine  .  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 Glaucoma of both eyes   . Left hydrocele   . Stroke ([place])    BP 160/79 mmHg  Pulse 89  Resp 14  SpO2 100%  Opioid Risk Score:   Fall Risk Score:  `1  Depression screen PHQ 2/9  Depression screen [facility] 2/9 05/16/2015 05/02/2015 03/29/2015  Decreased Interest 0 0 0  Down, Depressed, Hopeless 0 0 0  PHQ - 2 Score 0 0 0     Review of Systems  Neurological:       Spasms  All other systems reviewed and are negative.      Objective:   Physical Exam  Constitutional: He appears well-developed and  well-nourished.  HENT:  Head: Normocephalic and atraumatic.  Eyes: Conjunctivae and EOM are normal. Pupils are equal, round, and reactive to light.  Neck: Normal range of motion.  Neurological: He is alert.  Psychiatric: He has a normal mood and affect.  Nursing note and vitals reviewed.   Swelling in the right second third and fourth MCP as well as the dorsum of the hand and at the PIPs. He has decreased finger flexion approximately 50% range. He has some tenderness with MCP compression. No tenderness with wrist palpation. He has decreased flexion and extension at the right wrist. His tone in his bicep and pectoralis is much improved down to a  MAS1. He has good overhead motion with his arm. He has 4 minus strength at the deltoid biceps triceps.  Patient's lower extremity strength is 4/5 in hip flexors and extensors ankle dorsiflexor and plantar flexor  Ambulates with a 3-point cane     Assessment & Plan:  1.  Left pontine and medullary infarct,Right hemiparesis, spasticity. Onset 02/09/15 he is approximately 4 months post stroke.  Excellent response with Dysport. Tone is down from a modified Ashworth 3 to a modified Ashworth 1. As discussed with patient duration of responses 3-4 months. We will reschedule injection for 3 months however if he is still at a low level of tone at that time will wait until tone increases once again 2. Complex regional pain syndrome type I in the right upper extremity overall improved but still has significant pain in the hand and wrist area as well as stiffness and swelling. Has an appointment for possible stellate ganglion injection on 11 8. As discussed with the patient if he has resolution of his symptoms prior to that time he should call to cancel that appointment  We discussed the procedure of stellate ganglion block and emphasize that it is now a cure-all for his weakness and problems related to the stroke 3. Risk factors of hypertension follow up with primary  care physician, also managed dyslipidemia.

## 2015-06-27 NOTE — Patient Instructions (Addendum)
If your hand and wrist swelling improves he may not need to see the other doctor on November 8

## 2015-07-06 ENCOUNTER — Encounter: Payer: Self-pay | Admitting: Rehabilitative and Restorative Service Providers"

## 2015-07-06 NOTE — Therapy (Signed)
[facility] [address] [place], [place], [address] Phone: [phone]   Fax:  [phone]  Patient Details  Name: Russell Austin MRN: [id number] Date of Birth: 1950-10-29 Referring Provider:  No ref. provider found  Encounter Date: 07/06/2015  PHYSICAL THERAPY DISCHARGE SUMMARY  Visits from Start of Care: 14  Current functional level related to goals / functional outcomes:     PT Short Term Goals - 04/14/15 0854    PT SHORT TERM GOAL #1   Title The patient will verbalize understanding of stroke risk factors and warning signs.   Baseline edcuated and information given on 04/14/15.   Status Achieved   PT SHORT TERM GOAL #2   Title The patient will improve Berg score from 30/56 up to 38/56 to demo decreased risk for falls.   Baseline 04/06/15: 51/56 scored   Status Achieved   PT SHORT TERM GOAL #3   Title The patient will have gait further assessed and goal to follow (held today due to elvated BP).   Baseline Met on 03/16/2015 and new goal below.   Status Achieved   PT SHORT TERM GOAL #4   Title The patient will be able to negotiate his steps at home without sitting down to descend steps per report.   Baseline 04/06/15: pt reports using rail to walk up/down stairs at home now. demo in clinic: pt ascends with 1 rail fwd reciprocally, decends sideways with 1 rail step to pattern   Status Achieved   PT SHORT TERM GOAL #5   Title The patient will transfer sit<>stand and stand pivot modified indep.   Baseline Target date 04/08/2015 - met on 04/04/2015   Status Achieved   PT SHORT TERM GOAL #6   Title The patient will improve gait speed from 1.71 ft/sec up to 2.1 ft/sec to demo improving mobility.   Baseline 04/06/15: 1.57 ft/sec with hurry cane and min guard assist to supervision   Status Not Met   PT SHORT TERM GOAL #7   Title The patient will ambulate for household distances x 150 ft with CGA with least restrictive  assistive device (baseline min A with SBQC).   Baseline 04/06/15: 240 feet today with hurry cane with min guard assist to supervision at times   Status Achieved    *patient did not return for LTGs to be assessed.     PT Long Term Goals - 04/04/15 8185    PT LONG TERM GOAL #1   Title The patient will be indep with HEP.   Baseline Target date 05/08/2015   Time 8   Period Weeks   Status On-going   PT LONG TERM GOAL #2   Title The patient will improve Berg from 30/56 up to > or equal to 42/56.   Baseline Target date 05/08/2015   Time 8   Period Weeks   Status On-going   PT LONG TERM GOAL #3   Title Ambulate x 300 ft with least restrictive assistive device and supervision for improved functional mobility in the home and limited community distances.   Baseline Target date 05/08/2015   Time 8   Period Weeks   Status On-going   PT LONG TERM GOAL #4   Title The patient will improve stair negotiate x 4 steps to supervision with step to pattern and one handrails.   Baseline Target date 05/08/2015   Time 8   Period Weeks   Status On-going   PT LONG TERM GOAL #5  Title Improve SIS Mobility by 20% (baseline 41.7%).   Baseline Target date 05/08/2015   Time 8   Period Weeks   Status On-going   PT LONG TERM GOAL #6   Title Improve gait speed from 1.71 ft/sec to > or equal to 2.4 ft/sec for improved functional ambulation.   Baseline Target date 05/08/2015   Time 8   Period Weeks   Status On-going   PT LONG TERM GOAL #7   Title The patient will decrease TUG from 19.63 seconds to < or equal to 15 seconds to demo improved functional mobility.   Baseline Target date 05/08/2015   Time 8   Period Weeks   Status New        Remaining deficits: Patient continued with increased muscle tone, neck discomfort, slowed gait speed and imbalance related to hemimotor impairments.   Education / Equipment: HEP, home stretching, safety during gait.  Plan: Patient agrees to discharge.  Patient goals were not  met. Patient is being discharged due to not returning since the last visit.  ?????       Thank you for the referral of this patient. Russell Austin, MPT   Ovie Eastep 07/06/2015, 1:37 PM  Russell Austin [address] [place], [place], [address] Phone: [phone]   Fax:  [phone]

## 2015-07-25 ENCOUNTER — Ambulatory Visit: Payer: BC Managed Care – PPO | Admitting: Family Medicine

## 2015-08-08 ENCOUNTER — Encounter: Payer: Self-pay | Admitting: Physical Medicine & Rehabilitation

## 2015-08-08 ENCOUNTER — Other Ambulatory Visit: Payer: Self-pay | Admitting: Family Medicine

## 2015-08-08 ENCOUNTER — Encounter: Payer: BC Managed Care – PPO | Attending: Physical Medicine & Rehabilitation

## 2015-08-08 ENCOUNTER — Other Ambulatory Visit: Payer: Self-pay | Admitting: Physical Medicine and Rehabilitation

## 2015-08-08 ENCOUNTER — Ambulatory Visit (HOSPITAL_BASED_OUTPATIENT_CLINIC_OR_DEPARTMENT_OTHER): Payer: BC Managed Care – PPO | Admitting: Physical Medicine & Rehabilitation

## 2015-08-08 VITALS — BP 171/93 | HR 82 | Resp 16

## 2015-08-08 DIAGNOSIS — E11319 Type 2 diabetes mellitus with unspecified diabetic retinopathy without macular edema: Secondary | ICD-10-CM | POA: Diagnosis not present

## 2015-08-08 DIAGNOSIS — I10 Essential (primary) hypertension: Secondary | ICD-10-CM | POA: Insufficient documentation

## 2015-08-08 DIAGNOSIS — R4702 Dysphasia: Secondary | ICD-10-CM | POA: Diagnosis not present

## 2015-08-08 DIAGNOSIS — R200 Anesthesia of skin: Secondary | ICD-10-CM | POA: Insufficient documentation

## 2015-08-08 DIAGNOSIS — H409 Unspecified glaucoma: Secondary | ICD-10-CM | POA: Insufficient documentation

## 2015-08-08 DIAGNOSIS — G8110 Spastic hemiplegia affecting unspecified side: Secondary | ICD-10-CM | POA: Diagnosis not present

## 2015-08-08 DIAGNOSIS — R2689 Other abnormalities of gait and mobility: Secondary | ICD-10-CM | POA: Diagnosis not present

## 2015-08-08 NOTE — Patient Instructions (Addendum)
You received a dysport injection today. You may experience soreness at the needle injection sites. Please call us if any of the injection sites turns red after a couple days or if there is any drainage. You may experience muscle weakness as a result of Botox. This would improve with time but can take several weeks to improve. The Dysport should start working in about one week. The Dysport usually last 3-4 months. The injection can be repeated every 3-4 months as needed.

## 2015-08-08 NOTE — Progress Notes (Signed)
Subjective:    Patient ID: Russell Austin, male    DOB: Jun 04, 1951, 64 y.o.   MRN: [id number]  HPI  Pain Inventory Average Pain 4 Pain Right Now 4 My pain is sharp  In the last 24 hours, has pain interfered with the following? General activity 2 Relation with others 0 Enjoyment of life 0 What TIME of day is your pain at its worst? night Sleep (in general) Poor  Pain is worse with: other Pain improves with: medication Relief from Meds: 6  Mobility walk without assistance how many minutes can you walk? 30 ability to climb steps?  yes do you drive?  no Do you have any goals in this area?  yes  Function not employed: date last employed 2013 disabled: date disabled 2013 Do you have any goals in this area?  yes  Neuro/Psych spasms  Prior Studies Any changes since last visit?  no  Physicians involved in your care Any changes since last visit?  no   Family History  Problem Relation Age of Onset  . Glaucoma Mother   . Diabetes Mother   . Stomach cancer Maternal Grandfather   . Stroke Maternal Grandmother    Social History   Social History  . Marital Status: Married    Spouse Name: N/A  . Number of Children: 1  . Years of Education: 12   Occupational History  . Disabled    Social History Main Topics  . Smoking status: Never Smoker   . Smokeless tobacco: Never Used  . Alcohol Use: No  . Drug Use: No  . Sexual Activity: Not Asked   Other Topics Concern  . None   Social History Narrative   Lives at home with his wife and granddaughter.   Left-handed.   3 cups caffeine per day.   Past Surgical History  Procedure Laterality Date  . Undescended right testicle removed  1994  . Right eye vitrectomy/ insertion glaucoma seton/ laser repair  12-13-2008    RETINAL ARTERY OCCLUSION /NEOVASCULAR GLAUCOMA/ HEMORRHAGE  . Right eye vitretomy/ insertion glaucoma seton x2/ laser  03-24-2009    RECURRENT HEMORRHAGE/ OCCLUSION INTERNAL SETON  . Left eye  laser retina repair  SEPT 2012  . Appendectomy  AGE EARLY 20'S  . Hydrocele excision  03/31/2012    Procedure: HYDROCELECTOMY ADULT;  Surgeon: Franchot Gallo, MD;  [facility];  Service: Urology;  Laterality: Left;  73 MINS     Past Medical History  Diagnosis Date  . Diabetes mellitus type II   . Hypertension   . ED (erectile dysfunction)   . Diabetic retinopathy FOLLOWED BY DR Zadie Rhine  . 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 Glaucoma of both eyes   . Left hydrocele   . Stroke ([place])    BP 171/93 mmHg  Pulse 82  Resp 16  SpO2 100%  Opioid Risk Score:   Fall Risk Score:  `1  Depression screen PHQ 2/9  Depression screen [facility] 2/9 05/16/2015 05/02/2015 03/29/2015  Decreased Interest 0 0 0  Down, Depressed, Hopeless 0 0 0  PHQ - 2 Score 0 0 0     Review of Systems  Neurological:       Spasms  All other systems reviewed and are negative.      Objective:   Physical Exam        Assessment & Plan:  Dysport Injection for spasticity using needle EMG guidance  Dilution: 200 Units/ml Indication: Severe spasticity  which interferes with ADL,mobility and/or  hygiene and is unresponsive to medication management and other conservative care Informed consent was obtained after describing risks and benefits of the procedure with the patient. This includes bleeding, bruising, infection, excessive weakness, or medication side effects. A REMS form is on file and signed. Needle: 2" 25g needle electrode Number of units per muscle Pectoralis500U Biceps200U Brachialis200U Brachioradialis 100U All injections were done after obtaining appropriate EMG activity and after negative drawback for blood. The patient tolerated the procedure well. Post procedure instructions were given. A followup appointment was made.    F/U 2 months

## 2015-08-18 ENCOUNTER — Telehealth: Payer: Self-pay | Admitting: *Deleted

## 2015-08-18 NOTE — Telephone Encounter (Signed)
Mr Hochstein called and asked for a permanent handicap placard application. Is this ok?

## 2015-08-18 NOTE — Telephone Encounter (Signed)
ok 

## 2015-08-18 NOTE — Telephone Encounter (Signed)
Form placed on cart and Russell Austin notified it will be available tomorrow afternoon.

## 2015-10-07 ENCOUNTER — Encounter: Payer: BC Managed Care – PPO | Attending: Physical Medicine & Rehabilitation

## 2015-10-07 ENCOUNTER — Ambulatory Visit (HOSPITAL_BASED_OUTPATIENT_CLINIC_OR_DEPARTMENT_OTHER): Payer: BC Managed Care – PPO | Admitting: Physical Medicine & Rehabilitation

## 2015-10-07 ENCOUNTER — Encounter: Payer: Self-pay | Admitting: Physical Medicine & Rehabilitation

## 2015-10-07 VITALS — BP 166/91 | HR 87

## 2015-10-07 DIAGNOSIS — I10 Essential (primary) hypertension: Secondary | ICD-10-CM | POA: Insufficient documentation

## 2015-10-07 DIAGNOSIS — G8110 Spastic hemiplegia affecting unspecified side: Secondary | ICD-10-CM

## 2015-10-07 DIAGNOSIS — R200 Anesthesia of skin: Secondary | ICD-10-CM | POA: Insufficient documentation

## 2015-10-07 DIAGNOSIS — R2689 Other abnormalities of gait and mobility: Secondary | ICD-10-CM | POA: Diagnosis not present

## 2015-10-07 DIAGNOSIS — E11319 Type 2 diabetes mellitus with unspecified diabetic retinopathy without macular edema: Secondary | ICD-10-CM | POA: Insufficient documentation

## 2015-10-07 DIAGNOSIS — R4702 Dysphasia: Secondary | ICD-10-CM | POA: Diagnosis not present

## 2015-10-07 DIAGNOSIS — H409 Unspecified glaucoma: Secondary | ICD-10-CM | POA: Insufficient documentation

## 2015-10-07 DIAGNOSIS — G90511 Complex regional pain syndrome I of right upper limb: Secondary | ICD-10-CM

## 2015-10-07 MED ORDER — TIZANIDINE HCL 2 MG PO TABS: 2.0000 mg | ORAL_TABLET | Freq: Four times a day (QID) | ORAL | Status: DC | PRN

## 2015-10-07 NOTE — Progress Notes (Signed)
Subjective:    Patient ID: Russell Austin, male    DOB: 1951/06/19, 65 y.o.   MRN: [id number] History of left paramedian pontine and left upper medullary infarcts June 2016 HPI Right shoulder and hand doing better In terms of pain. There is still right hand swelling. There is been a total of 5 or 6 stellate ganglion blocks performed by anesthesiology.  No issues with any neck pain or complications postprocedure. Patient is independent with all his self-care and mobility. He still has difficulty using his right upper extremity. His right elbow is less flexed when he walks after the Right arm flexor Botox injection performed about one month ago. He also feels looser in the shoulder since the pectoralis Botox also one month ago.    Pain Inventory Average Pain 4 Pain Right Now 4 My pain is other  In the last 24 hours, has pain interfered with the following? General activity 7 Relation with others 0 Enjoyment of life 7 What TIME of day is your pain at its worst? night Sleep (in general) Poor  Pain is worse with: inactivity Pain improves with: medication Relief from Meds: 7  Mobility walk without assistance how many minutes can you walk? 30 ability to climb steps?  yes do you drive?  no  Function disabled: date disabled .2012 Do you have any goals in this area?  yes  Neuro/Psych bladder control problems spasms  Prior Studies Any changes since last visit?  no  Physicians involved in your care Any changes since last visit?  no   Family History  Problem Relation Age of Onset  . Glaucoma Mother   . Diabetes Mother   . Stomach cancer Maternal Grandfather   . Stroke Maternal Grandmother    Social History   Social History  . Marital Status: Married    Spouse Name: N/A  . Number of Children: 1  . Years of Education: 12   Occupational History  . Disabled    Social History Main Topics  . Smoking status: Never Smoker   . Smokeless tobacco: Never Used  .  Alcohol Use: No  . Drug Use: No  . Sexual Activity: Not Asked   Other Topics Concern  . None   Social History Narrative   Lives at home with his wife and granddaughter.   Left-handed.   3 cups caffeine per day.   Past Surgical History  Procedure Laterality Date  . Undescended right testicle removed  1994  . Right eye vitrectomy/ insertion glaucoma seton/ laser repair  12-13-2008    RETINAL ARTERY OCCLUSION /NEOVASCULAR GLAUCOMA/ HEMORRHAGE  . Right eye vitretomy/ insertion glaucoma seton x2/ laser  03-24-2009    RECURRENT HEMORRHAGE/ OCCLUSION INTERNAL SETON  . Left eye laser retina repair  SEPT 2012  . Appendectomy  AGE EARLY 20'S  . Hydrocele excision  03/31/2012    Procedure: HYDROCELECTOMY ADULT;  Surgeon: Franchot Gallo, MD;  Location: [facility];  Service: Urology;  Laterality: Left;  24 MINS     Past Medical History  Diagnosis Date  . Diabetes mellitus type II   . Hypertension   . ED (erectile dysfunction)   . Diabetic retinopathy FOLLOWED BY DR Zadie Rhine  . 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 Glaucoma of both eyes   . Left hydrocele   . Stroke ([facility])    BP 166/91 mmHg  Pulse 87  SpO2 100%  Opioid Risk Score:   Fall Risk Score:  `1  Depression  screen PHQ 2/9  Depression screen [facility] 2/9 10/07/2015 05/16/2015 05/02/2015 03/29/2015  Decreased Interest 0 0 0 0  Down, Depressed, Hopeless 0 0 0 0  PHQ - 2 Score 0 0 0 0    Review of Systems  Endocrine:       High blood sugars  All other systems reviewed and are negative.      Objective:   Physical Exam  Constitutional: He is oriented to person, place, and time. He appears well-developed and well-nourished.  HENT:  Head: Normocephalic and atraumatic.  Right iris opacified, absent vision R eye  Eyes: Conjunctivae and EOM are normal. Pupils are equal, round, and reactive to light.  Neck: Normal range of motion.  Neurological: He is alert and oriented to person, place, and  time.  Psychiatric: He has a normal mood and affect.  Nursing note and vitals reviewed.  Sensation intact to light touch in the upper limb and lower limb. Motor strength 5 minus in the right hip flexor and knee extensor 4 at the right ankle dorsiflexor, Motor strength 5/5 in the left hip flexor and knee extensor and ankle dorsal flexor and plantar flexor Motor strength is 3 minus in the right deltoid, biceps, triceps, grip, increased tone in the finger flexors Ashworth grade 2-3, elbow flexor tone 2, pectoralis 2 Motor strength is 5/5 in the left deltoid, biceps, triceps, grip  Patient is finger to thumb opposition with the index finger only Swelling in the dorsum of the right hand Gait shows some mild hip hiking no evidence of toe drag or knee instability. Approximately 30 of flexion at the elbow during ambulation on the right side    Assessment & Plan:  1.Right hemiparesis upper extremity > lower extremity following Left paramedian medullary and pontine infarcts He is completed inpatient and outpatient therapy. He has plateaued in his functional status.  2. Complex regional pain syndrome type I ie shoulder-hand syndrome improved after stellate ganglion blocks as well as physical therapy Would expect that he still has residual right hand swelling mainly due to dependent edema and weakness Do not see need for repeat stellate ganglion injection at current time  3. Spasticity right upper extremity primarily elbow flexors as well as pectoralis. We will repeat Dysport injection in 6 weeks

## 2015-10-07 NOTE — Patient Instructions (Signed)
Plan on repeat injection next visit

## 2015-10-27 ENCOUNTER — Telehealth: Payer: Self-pay | Admitting: *Deleted

## 2015-10-27 MED ORDER — TIZANIDINE HCL 2 MG PO TABS: ORAL_TABLET | ORAL | Status: DC

## 2015-10-27 NOTE — Telephone Encounter (Signed)
[name] calling for a refill on his hand medication.I clarified with him this is the tizanidine he is asking for. I clarified sig with Dr Letta Pate  : 1 twice daily and 2 at hs. #120

## 2015-10-28 ENCOUNTER — Telehealth: Payer: Self-pay | Admitting: Family Medicine

## 2015-10-28 NOTE — Telephone Encounter (Signed)
Pt would like to be work in for cpx before [place] 2017.

## 2015-10-31 NOTE — Telephone Encounter (Signed)
No work-ins per Dr Sherren Mocha.  Okay to schedule with new provider.  If patient needs refill until visit just let me know.

## 2015-11-02 NOTE — Telephone Encounter (Signed)
Pt will call back to sch.

## 2015-11-18 ENCOUNTER — Ambulatory Visit: Payer: BC Managed Care – PPO | Admitting: Physical Medicine & Rehabilitation

## 2015-11-18 ENCOUNTER — Encounter: Payer: BC Managed Care – PPO | Attending: Physical Medicine & Rehabilitation

## 2015-11-18 DIAGNOSIS — I10 Essential (primary) hypertension: Secondary | ICD-10-CM | POA: Insufficient documentation

## 2015-11-18 DIAGNOSIS — E11319 Type 2 diabetes mellitus with unspecified diabetic retinopathy without macular edema: Secondary | ICD-10-CM | POA: Insufficient documentation

## 2015-11-18 DIAGNOSIS — R200 Anesthesia of skin: Secondary | ICD-10-CM | POA: Insufficient documentation

## 2015-11-18 DIAGNOSIS — H409 Unspecified glaucoma: Secondary | ICD-10-CM | POA: Insufficient documentation

## 2015-11-18 DIAGNOSIS — R2689 Other abnormalities of gait and mobility: Secondary | ICD-10-CM | POA: Insufficient documentation

## 2015-11-18 DIAGNOSIS — R4702 Dysphasia: Secondary | ICD-10-CM | POA: Insufficient documentation

## 2015-12-06 ENCOUNTER — Ambulatory Visit (INDEPENDENT_AMBULATORY_CARE_PROVIDER_SITE_OTHER): Payer: BC Managed Care – PPO | Admitting: Adult Health

## 2015-12-06 ENCOUNTER — Encounter: Payer: Self-pay | Admitting: Adult Health

## 2015-12-06 VITALS — BP 130/60 | Temp 98.1°F

## 2015-12-06 DIAGNOSIS — M545 Low back pain, unspecified: Secondary | ICD-10-CM

## 2015-12-06 DIAGNOSIS — E1140 Type 2 diabetes mellitus with diabetic neuropathy, unspecified: Secondary | ICD-10-CM | POA: Diagnosis not present

## 2015-12-06 DIAGNOSIS — S90821A Blister (nonthermal), right foot, initial encounter: Secondary | ICD-10-CM

## 2015-12-06 DIAGNOSIS — F32A Depression, unspecified: Secondary | ICD-10-CM

## 2015-12-06 DIAGNOSIS — Z794 Long term (current) use of insulin: Secondary | ICD-10-CM | POA: Diagnosis not present

## 2015-12-06 DIAGNOSIS — F329 Major depressive disorder, single episode, unspecified: Secondary | ICD-10-CM

## 2015-12-06 LAB — POCT URINALYSIS DIPSTICK
BILIRUBIN UA: NEGATIVE
GLUCOSE UA: NEGATIVE
LEUKOCYTES UA: NEGATIVE
NITRITE UA: NEGATIVE
PH UA: 5
Spec Grav, UA: 1.025
Urobilinogen, UA: 1

## 2015-12-06 LAB — POCT GLYCOSYLATED HEMOGLOBIN (HGB A1C): Hemoglobin A1C: 7.4

## 2015-12-06 MED ORDER — CEPHALEXIN 500 MG PO CAPS: 500.0000 mg | ORAL_CAPSULE | Freq: Three times a day (TID) | ORAL | Status: DC

## 2015-12-06 NOTE — Patient Instructions (Signed)
It was great meeting you today!  I have sent in a prescription for Kelfex, take this three times a day for 10 days.   Ice for 20 minutes at a time  Elevate on a pillow.   Use ibuprofen 600mg  every 8 hours for pain  Use a heating pad on the back.   Follow up with me if you want to establish care.

## 2015-12-06 NOTE — Progress Notes (Signed)
Subjective:    Patient ID: Russell Austin, male    DOB: 09/05/1950, 65 y.o.   MRN: [id number]  HPI  This is a 65 year old gentleman, significant past medical history. Presents to the office today with multiple complaints  1) the last 3 days he's developed a blister on the top of his foot. He is unsure of where the blister came from. He denies any trauma to the area. Over the last 24 hours he's also noticed a bruise on the top of his foot. Planes of pain with palpation  2) for the last week he's had right sided lower back pain, that is more pronounced when he goes from a laying or sitting position to a standing position. Reports that when he is up moving around he is not bothered by the pain. Denies any pain with urination but does endorse going more often. Denies any trauma to the area that would cause the pain  3) depression- this is more of a concern from his wife, she presents to with him to this office visit feels as though over the last few months he has not been himself. Per wife he is more agitated, and he is not going out of the house as often. She is concerned that he might be depressed from a recent stroke has left him with right-sided weakness. Of course the patient denies any issues with depression and he does not feel as though he is depressed at this time. Does report frustration and at times anger with his current medical state.   Review of Systems  All other systems reviewed and are negative.  Past Medical History  Diagnosis Date  . Diabetes mellitus type II   . Hypertension   . ED (erectile dysfunction)   . Diabetic retinopathy FOLLOWED BY DR Zadie Rhine  . 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 Glaucoma of both eyes   . Left hydrocele   . Stroke [facility])     Social History   Social History  . Marital Status: Married    Spouse Name: N/A  . Number of Children: 1  . Years of Education: 12   Occupational History  . Disabled    Social History  Main Topics  . Smoking status: Never Smoker   . Smokeless tobacco: Never Used  . Alcohol Use: No  . Drug Use: No  . Sexual Activity: Not on file   Other Topics Concern  . Not on file   Social History Narrative   Lives at home with his wife and granddaughter.   Left-handed.   3 cups caffeine per day.    Past Surgical History  Procedure Laterality Date  . Undescended right testicle removed  1994  . Right eye vitrectomy/ insertion glaucoma seton/ laser repair  12-13-2008    RETINAL ARTERY OCCLUSION /NEOVASCULAR GLAUCOMA/ HEMORRHAGE  . Right eye vitretomy/ insertion glaucoma seton x2/ laser  03-24-2009    RECURRENT HEMORRHAGE/ OCCLUSION INTERNAL SETON  . Left eye laser retina repair  SEPT 2012  . Appendectomy  AGE EARLY 20'S  . Hydrocele excision  03/31/2012    Procedure: HYDROCELECTOMY ADULT;  Surgeon: Franchot Gallo, MD;  Location: [facility];  Service: Urology;  Laterality: Left;  42 MINS      Family History  Problem Relation Age of Onset  . Glaucoma Mother   . Diabetes Mother   . Stomach cancer Maternal Grandfather   . Stroke Maternal Grandmother     Allergies  Allergen Reactions  .  Lactose Intolerance (Gi) Diarrhea  . Apple Pectin [Pectin] Itching    ITCHY THROAT  . Peach [Prunus Persica] Itching    ITCHY THROAT    Current Outpatient Prescriptions on File Prior to Visit  Medication Sig Dispense Refill  . aspirin 325 MG tablet Take 1 tablet (325 mg total) by mouth daily. 30 tablet 0  . atorvastatin (LIPITOR) 10 MG tablet Take 1 tablet (10 mg total) by mouth daily at 6 PM. 90 tablet 3  . insulin glargine (LANTUS) 100 UNIT/ML injection Inject 0.24 mLs (24 Units total) into the skin at bedtime. 10 mL 11  . LANTUS SOLOSTAR 100 UNIT/ML Solostar Pen INJECT 23 UNITS SUBCUTANEOUSLY AT BEDTIME 15 pen 1  . losartan-hydrochlorothiazide (HYZAAR) 100-12.5 MG per tablet Take 1 tablet by mouth daily. 90 tablet 3  . metFORMIN (GLUCOPHAGE) 1000 MG tablet Take 1  tablet (1,000 mg total) by mouth 2 (two) times daily with a meal. 180 tablet 3  . tiZANidine (ZANAFLEX) 2 MG tablet Take 1 tablet twice a day and take 2 tablets at night 120 tablet 1  . traMADol (ULTRAM) 50 MG tablet Take 1 tablet (50 mg total) by mouth every 8 (eight) hours as needed. 90 tablet 1  . amLODipine (NORVASC) 2.5 MG tablet Take 1 tablet (2.5 mg total) by mouth daily. (Patient not taking: Reported on 12/06/2015) 90 tablet 3   No current facility-administered medications on file prior to visit.    BP 130/60 mmHg  Temp(Src) 98.1 F (36.7 C) (Oral)       Objective:   Physical Exam  Constitutional: He is oriented to person, place, and time. He appears well-developed and well-nourished. No distress.  HENT:  Head: Normocephalic and atraumatic.  Right Ear: External ear normal.  Left Ear: External ear normal.  Nose: Nose normal.  Mouth/Throat: Oropharynx is clear and moist.  Eyes:  Right iris opacified, absent vision R eye   Cardiovascular: Normal rate, regular rhythm, normal heart sounds and intact distal pulses.  Exam reveals no gallop and no friction rub.   No murmur heard. Pulmonary/Chest: Effort normal and breath sounds normal. No respiratory distress. He has no wheezes. He has no rales. He exhibits no tenderness.  Musculoskeletal: Normal range of motion. He exhibits tenderness. He exhibits no edema.       Right wrist: He exhibits swelling.  Neurological: He is alert and oriented to person, place, and time. He exhibits abnormal muscle tone (2 out of 5 strength in right hand).  Skin: Skin is warm and dry. No rash noted. He is not diaphoretic. No erythema. No pallor.  Small blister on top of right foot  Psychiatric: He has a normal mood and affect. His behavior is normal. Judgment and thought content normal.  Nursing note and vitals reviewed.     Assessment & Plan:  1. Type 2 diabetes mellitus with diabetic neuropathy, with long-term current use of insulin (HCC) - POC HgB  A1c- 7.4  2. Right-sided low back pain without sciatica - POC Urinalysis Dipstick Positive blood and 3+ protein - Pain likely MSK in nature. -Advised to continue taking prescribed Zanaflex, as well as 600 mg ibuprofen every 8 hours for the next 3 days -Advised stretching exercises as tolerated 3. Depression - Patient denied having any depressive symptoms -Advised to get outside and at least walk around the block as much as possible -History of local therapists given to patient and I encouraged him to follow-up with one of them 4. Blister of right foot, initial  encounter - cephALEXin (KEFLEX) 500 MG capsule; Take 1 capsule (500 mg total) by mouth 3 (three) times daily.  Dispense: 30 capsule; Refill: 0 - Did not appear to be any infection. Unsure where the bruise came from advised ice and elevation -Also advised patient to get a new pair of Diabetic shoes as he's had his current pair for over one year - Can apply Neosporin to blister -Follow-up of with any signs of infection

## 2015-12-06 NOTE — Progress Notes (Signed)
Pre visit review using our clinic review tool, if applicable. No additional management support is needed unless otherwise documented below in the visit note. 

## 2015-12-14 ENCOUNTER — Encounter: Payer: Self-pay | Admitting: Podiatry

## 2015-12-14 ENCOUNTER — Ambulatory Visit (INDEPENDENT_AMBULATORY_CARE_PROVIDER_SITE_OTHER): Payer: BC Managed Care – PPO | Admitting: Podiatry

## 2015-12-14 VITALS — BP 141/82 | HR 98 | Resp 14

## 2015-12-14 DIAGNOSIS — T148 Other injury of unspecified body region: Secondary | ICD-10-CM

## 2015-12-14 DIAGNOSIS — L989 Disorder of the skin and subcutaneous tissue, unspecified: Secondary | ICD-10-CM

## 2015-12-14 DIAGNOSIS — IMO0002 Reserved for concepts with insufficient information to code with codable children: Secondary | ICD-10-CM

## 2015-12-14 NOTE — Progress Notes (Addendum)
   Subjective:    Patient ID: Russell Austin, male    DOB: 23-Sep-1950, 65 y.o.   MRN: [id number]  HPIthis patient presents to the office with chief complaint of 2 skin lesions on the top of his right foot. He states that these skin lesions developed last week. He states they are occasionally painful walking and wearing his shoes. This patient is diabetic with neuropathy  and CVA.  He is concerned about the skin lesions that have developed.  Also upon exam i t was noted there was an inter digital laceration between the first and second toes of the left foot.  There is bleeding in the absence of pus or infection. He presents to the office for an evaluation of his feet    Review of Systems  Constitutional: Positive for unexpected weight change.  HENT: Positive for sinus pressure and sneezing.   Respiratory: Positive for cough.   Musculoskeletal:       Muscle pain  Skin:       Open sores  Allergic/Immunologic: Positive for environmental allergies.       Objective:   Physical Exam GENERAL APPEARANCE: Alert, conversant. Appropriately groomed. No acute distress.  VASCULAR: Pedal pulses are  palpable at  [facility] and PT bilateral.  Capillary refill time is immediate to all digits,  Normal temperature gradient.  Digital hair growth is present bilateral  NEUROLOGIC: sensation is normal to 5.07 monofilament at 5/5 sites bilateral.  Light touch is intact bilateral, Muscle strength normal.  MUSCULOSKELETAL: acceptable muscle strength, tone and stability bilateral.  Intrinsic muscluature intact bilateral.  HAV 1st MPJ  B/L  DERMATOLOGIC: skin color, texture, and turgor are within normal limits.  No preulcerative lesions or ulcers  are seen, no interdigital maceration noted.  No open lesions present.  . No drainage noted.Two flattened black lesions on dorsum of right foot. No redness or swelling or pus noted. There is laceration between 1.2 left foot.  NAILS  Thick disfigured discolored nails both  feet.         Assessment & Plan:  Benign skin lesion right foot.  Laceration left foot.  IE  Examined the skin lesions and laceration.  The black spots appear to be inflammatory reaction to an insect bite.  Told patient there is no sign of inflammation and should be self-limiting lesions.  Told him to soak both feet and RTC 2 weeks.   Gardiner Barefoot DPM

## 2015-12-15 ENCOUNTER — Telehealth: Payer: Self-pay | Admitting: Adult Health

## 2015-12-15 NOTE — Telephone Encounter (Signed)
Left voicemail for patient to return call regarding labs

## 2015-12-19 ENCOUNTER — Telehealth: Payer: Self-pay | Admitting: Family Medicine

## 2015-12-19 MED ORDER — GLUCOSE BLOOD VI STRP: ORAL_STRIP | Status: DC

## 2015-12-19 MED ORDER — INSULIN GLARGINE 100 UNIT/ML SOLOSTAR PEN: PEN_INJECTOR | SUBCUTANEOUS | Status: DC

## 2015-12-19 NOTE — Telephone Encounter (Signed)
Pt needs refills on freestyle test strips and lantus solostar pen send to [address]

## 2015-12-19 NOTE — Telephone Encounter (Signed)
Rx sent 

## 2015-12-24 ENCOUNTER — Other Ambulatory Visit: Payer: Self-pay | Admitting: Physical Medicine & Rehabilitation

## 2015-12-26 NOTE — Telephone Encounter (Signed)
Please advise on refill. I do not see recent documentation. Thanks.

## 2015-12-28 ENCOUNTER — Encounter: Payer: Self-pay | Admitting: Podiatry

## 2015-12-28 ENCOUNTER — Ambulatory Visit (INDEPENDENT_AMBULATORY_CARE_PROVIDER_SITE_OTHER): Payer: BC Managed Care – PPO | Admitting: Podiatry

## 2015-12-28 VITALS — BP 144/80 | HR 82 | Resp 14

## 2015-12-28 DIAGNOSIS — B351 Tinea unguium: Secondary | ICD-10-CM | POA: Diagnosis not present

## 2015-12-28 DIAGNOSIS — M79676 Pain in unspecified toe(s): Secondary | ICD-10-CM

## 2015-12-28 NOTE — Progress Notes (Signed)
Patient ID: Russell Austin, male   DOB: September 08, 1950, 65 y.o.   MRN: [id number] Complaint:  Visit Type: Patient returns to my office for continued preventative foot care services. Complaint: Patient states" my nails have grown long and thick and become painful to walk and wear shoes" Patient has been diagnosed with DM with neuropathy.. The patient presents for preventative foot care services. No changes to ROS.  His skin lesions have healed both feet.  Podiatric Exam: Vascular: dorsalis pedis and posterior tibial pulses are palpable bilateral. Capillary return is immediate. Temperature gradient is WNL. Skin turgor WNL  Sensorium: Normal Semmes Weinstein monofilament test. Normal tactile sensation bilaterally. Nail Exam: Pt has thick disfigured discolored nails with subungual debris noted bilateral entire nail hallux through fifth toenails Ulcer Exam: There is no evidence of ulcer or pre-ulcerative changes or infection. Orthopedic Exam: Muscle tone and strength are WNL. No limitations in general ROM. No crepitus or effusions noted. Foot type and digits show no abnormalities. HAV 1st MPJ B/L.Marland Kitchen Skin: No Porokeratosis. No infection or ulcers.  Desquamation noted right foot dorsally.  Diagnosis:  Onychomycosis, , Pain in right toe, pain in left toes  Treatment & Plan Procedures and Treatment: Consent by patient was obtained for treatment procedures. The patient understood the discussion of treatment and procedures well. All questions were answered thoroughly reviewed. Debridement of mycotic and hypertrophic toenails, 1 through 5 bilateral and clearing of subungual debris. No ulceration, no infection noted.  Return Visit-Office Procedure: Patient instructed to return to the office for a follow up visit prn  for continued evaluation and treatment.    Gardiner Barefoot DPM

## 2015-12-30 ENCOUNTER — Ambulatory Visit (HOSPITAL_BASED_OUTPATIENT_CLINIC_OR_DEPARTMENT_OTHER): Payer: BC Managed Care – PPO | Admitting: Physical Medicine & Rehabilitation

## 2015-12-30 ENCOUNTER — Encounter: Payer: BC Managed Care – PPO | Attending: Physical Medicine & Rehabilitation

## 2015-12-30 ENCOUNTER — Encounter: Payer: Self-pay | Admitting: Physical Medicine & Rehabilitation

## 2015-12-30 VITALS — BP 160/83 | HR 86

## 2015-12-30 DIAGNOSIS — H409 Unspecified glaucoma: Secondary | ICD-10-CM | POA: Insufficient documentation

## 2015-12-30 DIAGNOSIS — I6930 Unspecified sequelae of cerebral infarction: Secondary | ICD-10-CM

## 2015-12-30 DIAGNOSIS — R2689 Other abnormalities of gait and mobility: Secondary | ICD-10-CM | POA: Insufficient documentation

## 2015-12-30 DIAGNOSIS — R200 Anesthesia of skin: Secondary | ICD-10-CM | POA: Diagnosis present

## 2015-12-30 DIAGNOSIS — E11319 Type 2 diabetes mellitus with unspecified diabetic retinopathy without macular edema: Secondary | ICD-10-CM | POA: Diagnosis not present

## 2015-12-30 DIAGNOSIS — R4702 Dysphasia: Secondary | ICD-10-CM | POA: Diagnosis not present

## 2015-12-30 DIAGNOSIS — I10 Essential (primary) hypertension: Secondary | ICD-10-CM | POA: Insufficient documentation

## 2015-12-30 NOTE — Patient Instructions (Signed)
Dyport injection Pectoralis500U Biceps200U Brachialis200U Brachioradialis 100U

## 2015-12-30 NOTE — Progress Notes (Signed)
Subjective:    Patient ID: Russell Austin, male    DOB: 05/15/1951, 65 y.o.   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 He had some improvements in terms of right hand pain is well his shoulder pain after a series of stellate ganglion blocks performed by Dr. Hamilton Capri Overall he's been doing quite well as he ambulated vice. He still has decreased strength in the right upper extremity as well as decreased coordination in the right hand. His family feels like he is depressed the patient denies this. He has been seen by a psychologist or counselor and apparently testing has also been positive for depression. We discussed post stroke depression and that it occurs an approximate 50% of patients and is common within the first 2 years after a stroke Pain Inventory Average Pain 4 Pain Right Now 4 My pain is sharp  In the last 24 hours, has pain interfered with the following? General activity 2 Relation with others 0 Enjoyment of life 0 What TIME of day is your pain at its worst? night Sleep (in general) Poor  Pain is worse with: other Pain improves with: medication Relief from Meds: 6  Mobility walk without assistance how many minutes can you walk? 30 ability to climb steps?  yes do you drive?  no Do you have any goals in this area?  yes  Function not employed: date last employed 2013 disabled: date disabled 2013 Do you have any goals in this area?  yes  Neuro/Psych spasms  Prior Studies Any changes since last visit?  no  Physicians involved in your care Any changes since last visit?  no   Family History  Problem Relation Age of Onset  . Glaucoma Mother   . Diabetes Mother   . Stomach cancer Maternal Grandfather   . Stroke Maternal Grandmother    Social History   Social History  . Marital Status: Married    Spouse Name: N/A  . Number of Children: 1    . Years of Education: 12   Occupational History  . Disabled    Social History Main Topics  . Smoking status: Never Smoker   . Smokeless tobacco: Never Used  . Alcohol Use: No  . Drug Use: No  . Sexual Activity: Not Asked   Other Topics Concern  . None   Social History Narrative   Lives at home with his wife and granddaughter.   Left-handed.   3 cups caffeine per day.   Past Surgical History  Procedure Laterality Date  . Undescended right testicle removed  1994  . Right eye vitrectomy/ insertion glaucoma seton/ laser repair  12-13-2008    RETINAL ARTERY OCCLUSION /NEOVASCULAR GLAUCOMA/ HEMORRHAGE  . Right eye vitretomy/ insertion glaucoma seton x2/ laser  03-24-2009    RECURRENT HEMORRHAGE/ OCCLUSION INTERNAL SETON  . Left eye laser retina repair  SEPT 2012  . Appendectomy  AGE EARLY 20'S  . Hydrocele excision  03/31/2012    Procedure: HYDROCELECTOMY ADULT;  Surgeon: Franchot Gallo, MD;  [facility]  Service: Urology;  Laterality: Left;  32 MINS     Past Medical History  Diagnosis Date  . Diabetes mellitus type II   . Hypertension   . ED (erectile dysfunction)   . Diabetic retinopathy FOLLOWED BY DR Zadie Rhine  . 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 Glaucoma of both eyes   .  Left hydrocele   . Stroke (HCC)    BP 160/83 mmHg  Pulse 86  SpO2 99%  Opioid Risk Score:   Fall Risk Score:  `1  Depression screen PHQ 2/9  Depression screen [facility] 2/9 10/07/2015 05/16/2015 05/02/2015 03/29/2015  Decreased Interest 0 0 0 0  Down, Depressed, Hopeless 0 0 0 0  PHQ - 2 Score 0 0 0 0     Review of Systems  Neurological:       Spasms  All other systems reviewed and are negative.      Objective:   Physical Exam Tone is increased with Ashworth grade 3 at the pectoralis on the right side, Ashworth grade 2 at the biceps and elbow flexors. There is no significant spasticity in the wrist and finger flexors. Upper extremity  strength is 3 minus at the deltoid, biceps, triceps, grip 4 minus at the right hip flexors and knee extensors ankle dorsiflexor He has normal strength in left upper and left lower limb Mood and affect without evidence of lability or agitation       Assessment & Plan:   1. 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 We discussed that he is plateauing in terms of his physical and cognitive functioning and that this is likely his new normal. 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 The patient is competent. He is refusing antidepressant treatment. He states he doesn't want any crazy pills. We will discuss this again in 6 weeks.  I do think he is permanently disabled, I do think he qualifies for a permanent handicap Parking sticker  2. Right upper sternal spasticity,  Dilution: 200 Units/ml Indication: Severe spasticity which interferes with ADL,mobility and/or  hygiene and is unresponsive to medication management and other conservative care Informed consent was obtained after describing risks and benefits of the procedure with the patient. This includes bleeding, bruising, infection, excessive weakness, or medication side effects. A REMS form is on file and signed. Needle: 2" 25g needle electrode Number of units per muscle Pectoralis500U Biceps200U Brachialis200U Pronator teres 100 units All injections were done after obtaining appropriate EMG activity and after negative drawback for blood. The patient tolerated the procedure well. Post procedure instructions were given. A followup appointment was made.  Reduced EMG activity noted in the biceps and brachialis as well as brachial radialis Strong EMG activity at the pectoralis  Would recommend just injecting pectoralis 12 weeks unless bicep tone increases again, Reassess in 6 weeks

## 2016-01-24 LAB — HM DIABETES EYE EXAM

## 2016-01-25 ENCOUNTER — Telehealth: Payer: Self-pay | Admitting: *Deleted

## 2016-01-25 NOTE — Telephone Encounter (Signed)
Pt called our clinic ([place])  asking for refills on cardiac medication, Patient is asking for refills on atorvastatin last filled by Marletta Lor, MD and Hyzaar last filled by Dorena Cookey, MD.

## 2016-01-26 ENCOUNTER — Telehealth: Payer: Self-pay | Admitting: *Deleted

## 2016-01-26 MED ORDER — LOSARTAN POTASSIUM-HCTZ 100-12.5 MG PO TABS: 1.0000 | ORAL_TABLET | Freq: Every day | ORAL | Status: DC

## 2016-01-26 MED ORDER — ATORVASTATIN CALCIUM 10 MG PO TABS: 10.0000 mg | ORAL_TABLET | Freq: Every day | ORAL | Status: DC

## 2016-01-26 NOTE — Telephone Encounter (Signed)
Pt left a message asking for refills on losartan and atorvastatin. Our physician for this patient is not handling pt's cardiac medications. Refill request forwarded to the physicians that wrote the last scripts Dr. Inda Merlin and Dr. Sherren Mocha

## 2016-01-27 MED ORDER — LOSARTAN POTASSIUM-HCTZ 100-12.5 MG PO TABS: 1.0000 | ORAL_TABLET | Freq: Every day | ORAL | Status: DC

## 2016-01-27 MED ORDER — ATORVASTATIN CALCIUM 10 MG PO TABS: 10.0000 mg | ORAL_TABLET | Freq: Every day | ORAL | Status: DC

## 2016-01-27 NOTE — Telephone Encounter (Signed)
Left message Rx's sent to pharmacy as requested.

## 2016-01-27 NOTE — Telephone Encounter (Signed)
Okay to refill these medications 

## 2016-01-27 NOTE — Addendum Note (Signed)
Addended by: Marian Sorrow on: 01/27/2016 09:18 AM   Modules accepted: Orders

## 2016-02-07 ENCOUNTER — Ambulatory Visit: Payer: BC Managed Care – PPO | Admitting: Physical Medicine & Rehabilitation

## 2016-03-13 ENCOUNTER — Ambulatory Visit: Payer: BC Managed Care – PPO | Admitting: Adult Health

## 2016-03-13 ENCOUNTER — Telehealth: Payer: Self-pay | Admitting: Family Medicine

## 2016-03-13 NOTE — Telephone Encounter (Signed)
Please advise 

## 2016-03-13 NOTE — Telephone Encounter (Signed)
That is fine 

## 2016-03-13 NOTE — Telephone Encounter (Signed)
Pt got his appointment time confused today. Has been waiting 3 months to see you and missed his appointment. Is it ok to work pt in first of august on a slow day?

## 2016-03-20 ENCOUNTER — Ambulatory Visit (HOSPITAL_BASED_OUTPATIENT_CLINIC_OR_DEPARTMENT_OTHER): Payer: BC Managed Care – PPO | Admitting: Physical Medicine & Rehabilitation

## 2016-03-20 ENCOUNTER — Encounter: Payer: BC Managed Care – PPO | Attending: Physical Medicine & Rehabilitation

## 2016-03-20 ENCOUNTER — Encounter: Payer: Self-pay | Admitting: Physical Medicine & Rehabilitation

## 2016-03-20 VITALS — BP 151/84 | HR 80 | Resp 14

## 2016-03-20 DIAGNOSIS — R200 Anesthesia of skin: Secondary | ICD-10-CM | POA: Insufficient documentation

## 2016-03-20 DIAGNOSIS — G8110 Spastic hemiplegia affecting unspecified side: Secondary | ICD-10-CM

## 2016-03-20 DIAGNOSIS — R2689 Other abnormalities of gait and mobility: Secondary | ICD-10-CM | POA: Insufficient documentation

## 2016-03-20 DIAGNOSIS — R4702 Dysphasia: Secondary | ICD-10-CM | POA: Diagnosis not present

## 2016-03-20 DIAGNOSIS — H409 Unspecified glaucoma: Secondary | ICD-10-CM | POA: Diagnosis not present

## 2016-03-20 DIAGNOSIS — I6930 Unspecified sequelae of cerebral infarction: Secondary | ICD-10-CM | POA: Diagnosis not present

## 2016-03-20 DIAGNOSIS — I10 Essential (primary) hypertension: Secondary | ICD-10-CM | POA: Insufficient documentation

## 2016-03-20 DIAGNOSIS — E11319 Type 2 diabetes mellitus with unspecified diabetic retinopathy without macular edema: Secondary | ICD-10-CM | POA: Insufficient documentation

## 2016-03-20 MED ORDER — GABAPENTIN 300 MG PO CAPS: 300.0000 mg | ORAL_CAPSULE | Freq: Every day | ORAL | Status: DC

## 2016-03-20 NOTE — Progress Notes (Signed)
Subjective:    Patient ID: Russell Austin, male    DOB: Mar 28, 1951, 65 y.o.   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 This is on the right affected side. He has some right shoulder pain but also has pain along the entire right side of his body, both upper and lower limb. When he tries to do on that side. Previously he had been using tramadol, which gave him some relief. His pain is primarily at night. No falls No recurrence of hand swelling Pain Inventory Average Pain 7 Pain Right Now 7 My pain is aching  In the last 24 hours, has pain interfered with the following? General activity 5 Relation with others 0 Enjoyment of life 5 What TIME of day is your pain at its worst? night Sleep (in general) Poor  Pain is worse with: some activites Pain improves with: medication Relief from Meds: 2  Mobility walk without assistance how many minutes can you walk? 30 ability to climb steps?  yes do you drive?  no transfers alone Do you have any goals in this area?  no  Function disabled: date disabled .  Neuro/Psych bladder control problems spasms  Prior Studies Any changes since last visit?  no  Physicians involved in your care Any changes since last visit?  no   Family History  Problem Relation Age of Onset  . Glaucoma Mother   . Diabetes Mother   . Stomach cancer Maternal Grandfather   . Stroke Maternal Grandmother    Social History   Social History  . Marital Status: Married    Spouse Name: N/A  . Number of Children: 1  . Years of Education: 12   Occupational History  . Disabled    Social History Main Topics  . Smoking status: Never Smoker   . Smokeless tobacco: Never Used    . Alcohol Use: No  . Drug Use: No  . Sexual Activity: Not Asked   Other Topics Concern  . None   Social History Narrative   Lives at home with his wife and granddaughter.   Left-handed.   3 cups caffeine per day.   Past Surgical History  Procedure Laterality Date  . Undescended right testicle removed  1994  . Right eye vitrectomy/ insertion glaucoma seton/ laser repair  12-13-2008    RETINAL ARTERY OCCLUSION /NEOVASCULAR GLAUCOMA/ HEMORRHAGE  . Right eye vitretomy/ insertion glaucoma seton x2/ laser  03-24-2009    RECURRENT HEMORRHAGE/ OCCLUSION INTERNAL SETON  . Left eye laser retina repair  SEPT 2012  . Appendectomy  AGE EARLY 20'S  . Hydrocele excision  03/31/2012    Procedure: HYDROCELECTOMY ADULT;  Surgeon: Franchot Gallo, MD;  Location: [facility];  Service: Urology;  Laterality: Left;  78 MINS     Past Medical History  Diagnosis Date  . Diabetes mellitus type II   . Hypertension   . ED (erectile dysfunction)   . Diabetic retinopathy FOLLOWED BY DR Zadie Rhine  . 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 Glaucoma of both eyes   . Left hydrocele   . Stroke ([place])    BP 151/84 mmHg  Pulse 80  Resp 14  SpO2 97%  Opioid Risk Score:   Fall Risk Score:  `1  Depression screen PHQ 2/9  Depression screen [facility] 2/9 10/07/2015 05/16/2015 05/02/2015 03/29/2015  Decreased Interest 0 0 0 0  Down, Depressed, Hopeless 0 0 0 0  PHQ - 2 Score 0 0 0 0     Review of Systems  Constitutional: Positive for unexpected weight change.  HENT: Negative.   Eyes: Negative.   Respiratory: Negative.   Cardiovascular: Negative.   Gastrointestinal: Negative.   Genitourinary: Positive for difficulty urinating.  Musculoskeletal:       Spasms  Skin: Negative.   Allergic/Immunologic: Negative.   Neurological: Negative.   Hematological: Negative.   Psychiatric/Behavioral: Negative.   All other systems reviewed and are negative.      Objective:    Physical Exam Right trapezius is tender to palpation, right levator tender to palpation. Patient with 4 minus strength in the right deltoid, biceps, triceps, grip, Decreased motor control. Ashworth grade 0 spasticity and elbow flexors, 1 at the finger flexors, 0 at the wrist flexor pronator is tight. He can only supinate to about 50% range. Patient has some tightness, unable to put his hand behind his head with the right hand. He feels tightness in his anterior shoulder and pectoral area. No swelling in the right hand. There is no hypersensitivity to touch in the right upper or right lower limb. Gait is without evidence of toe drag or knee instability, no assisted device. No evidence of foot drag, but does have a stiff legged gait on the right side.        Assessment & Plan:  1. Left brainstem infarct with right hemiparesis he's had overall excellent recovery. He is back to a modified independent level. Patient had episode or reflex sympathetic dystrophy, but this has subsided He does have hypersensitivity on the entire right side, most likely due to his medullary infarct on the left side We'll start gabapentin 300 mg daily at bedtime, may have to increase to 400 mg daily at bedtime however, as this is not helpful, we would consider restarting tramadol  2.  Right upper limb spasticity, Dysport 1000U , 700U in pect, 200 in pronator, 100 U trap

## 2016-03-20 NOTE — Patient Instructions (Signed)
If the gabapentin is not helpful for the right-sided pain, we may need to either increase it or switch back to tramadol

## 2016-03-29 NOTE — Telephone Encounter (Signed)
Pt has been scheduled.  °

## 2016-04-04 IMAGING — MR MR HEAD W/O CM
9 of 10 series · 37 of 48 positions shown · non-contrast
Comparison: Head CT 02/12/2015.  MRI 02/09/2015.

CLINICAL DATA: Worsening right-sided weakness beginning 12 hours
ago.

EXAM:
MRI HEAD WITHOUT CONTRAST
TECHNIQUE: Multiplanar, multiecho pulse sequences of the brain and surrounding
structures were obtained without intravenous contrast.

[Series 3: DWI · axial · 3.0mm · 1.09mm/px · z∈[-64,+76]mm · 11 of 98 slices shown (1 of 4)]
[im 1/98]
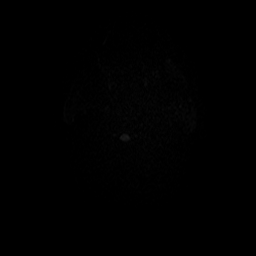
[im 10/98]
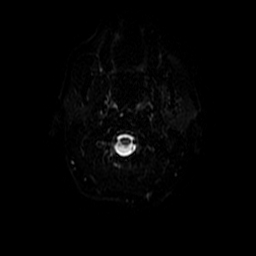
[im 20/98]
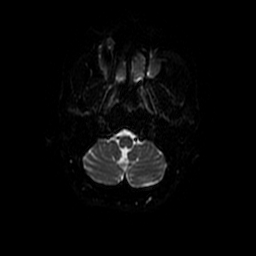
[im 30/98]
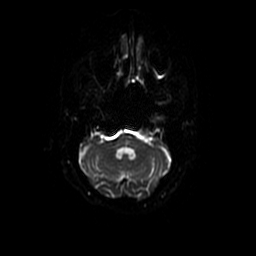
[im 39/98]
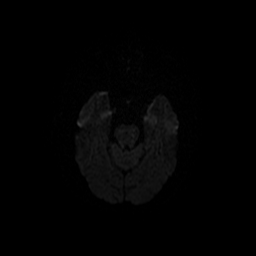
[im 49/98]
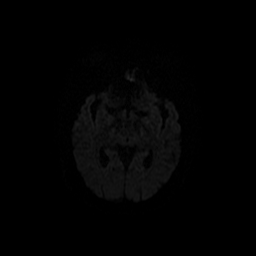
[im 59/98]
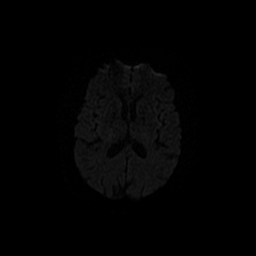
[im 68/98]
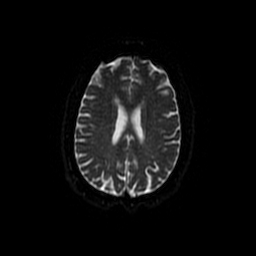
[im 78/98]
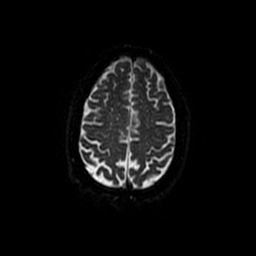
[im 88/98]
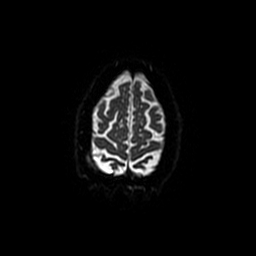
[im 98/98]
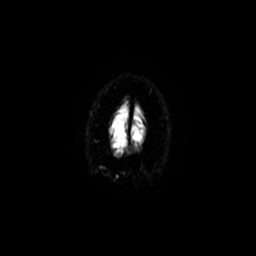

[Series 4: T1 · sagittal · 5.0mm · 0.47mm/px · 2 of 23 slices shown]
[im 1/23]
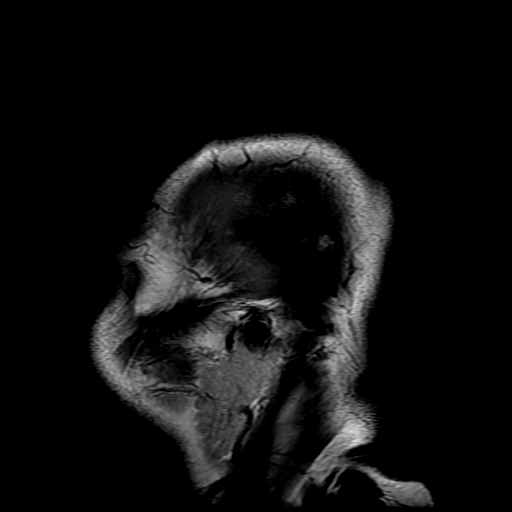
[im 23/23]
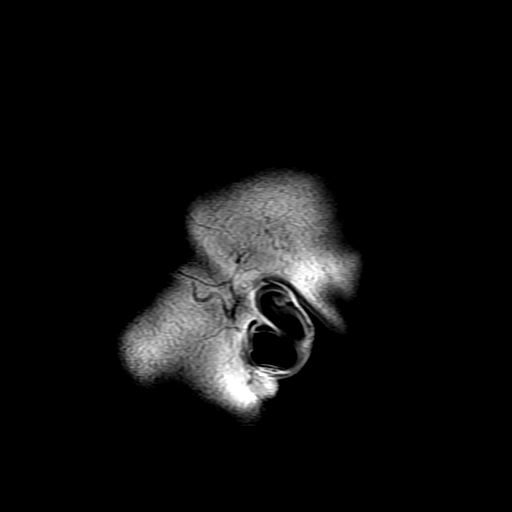

[Series 5: T2 · axial · 5.0mm · 0.43mm/px · z∈[-74,+60]mm · 2 of 24 slices shown (1 of 2)]
[im 1/24]
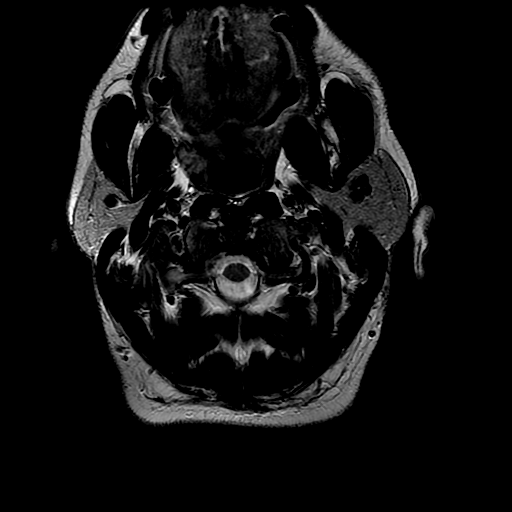
[im 24/24]
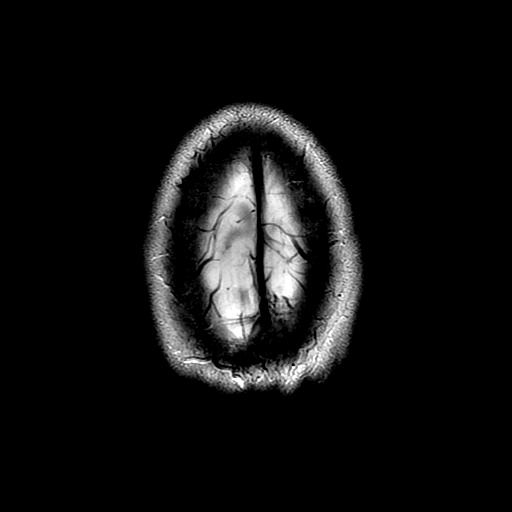

[Series 6: DWI · coronal · 5.0mm · 1.09mm/px · 7 of 68 slices shown (2 of 4)]
[im 1/68]
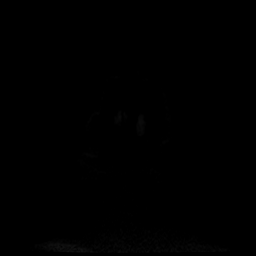
[im 12/68]
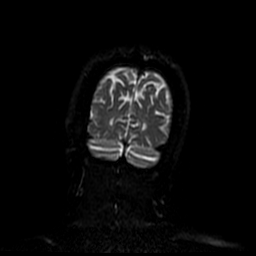
[im 23/68]
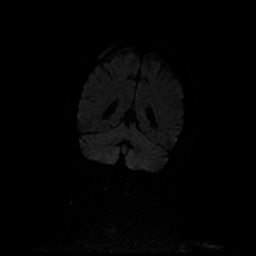
[im 34/68]
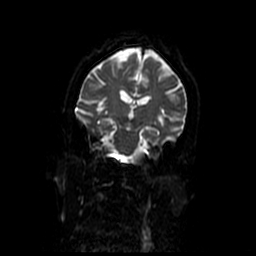
[im 45/68]
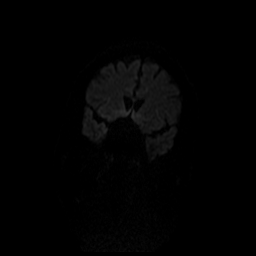
[im 56/68]
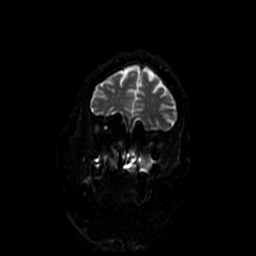
[im 68/68]
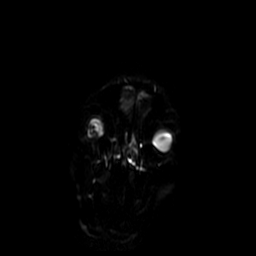

[Series 7: FLAIR · axial · 5.0mm · 0.43mm/px · z∈[-74,+60]mm · 2 of 24 slices shown]
[im 1/24]
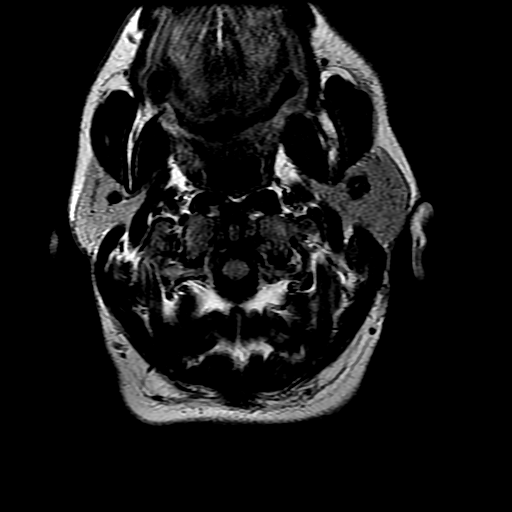
[im 24/24]
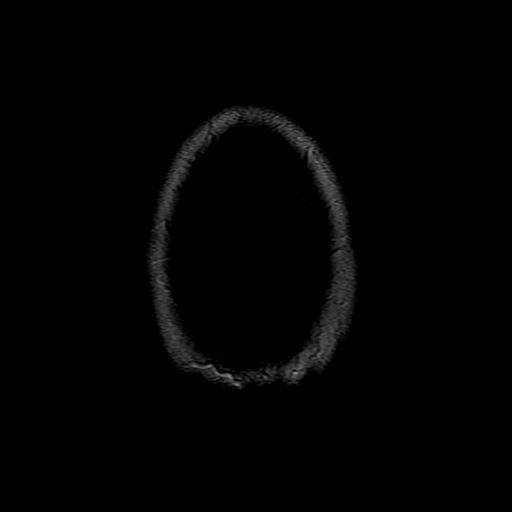

[Series 8: ax mpgr · axial · 5.0mm · 0.43mm/px · 1 of 24 slices shown]
[im 1/24]
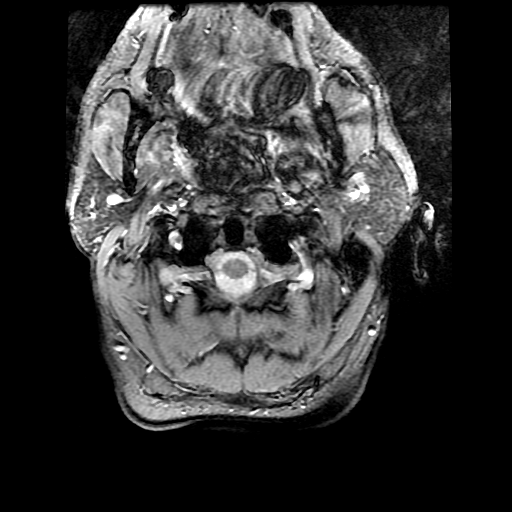

[Series 10: T2 · coronal · 5.0mm · 0.39mm/px · 3 of 25 slices shown (2 of 2)]
[im 1/25]
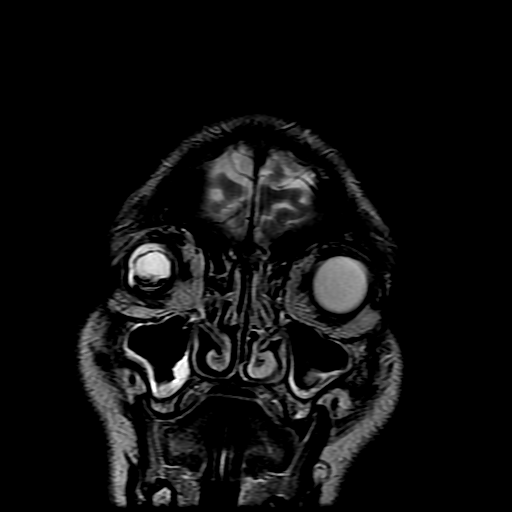
[im 13/25]
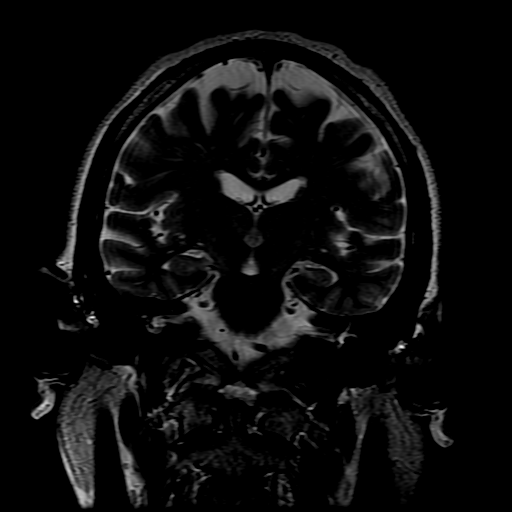
[im 25/25]
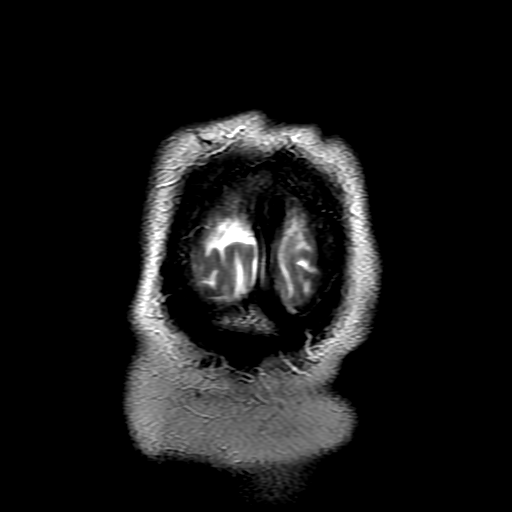

[Series 300: DWI · axial · 3.0mm · 1.09mm/px · z∈[-64,+76]mm · 5 of 49 slices shown (3 of 4)]
[im 1/49]
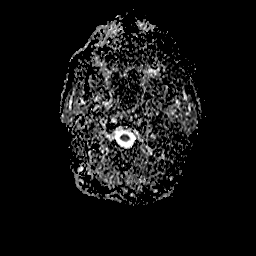
[im 13/49]
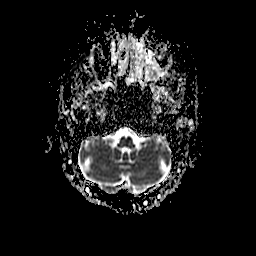
[im 25/49]
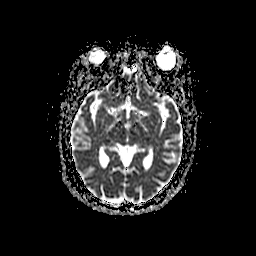
[im 37/49]
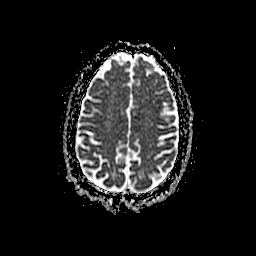
[im 49/49]
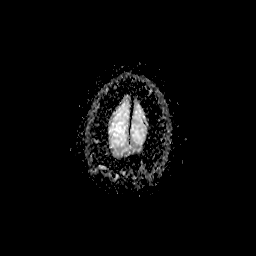

[Series 600: DWI · coronal · 5.0mm · 1.09mm/px · 4 of 34 slices shown (4 of 4)]
[im 1/34]
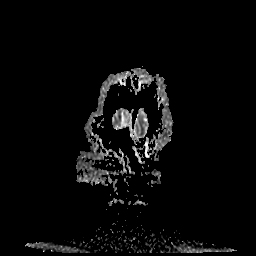
[im 12/34]
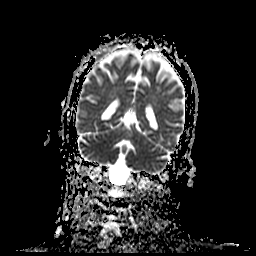
[im 23/34]
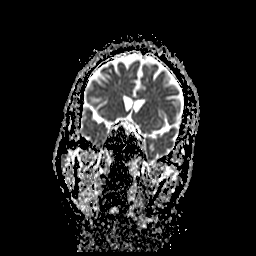
[im 34/34]
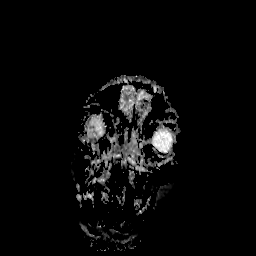

[37 of 48 positions shown; findings below may reference images not displayed]

FINDINGS: Today's study does not suffer from as much motion degradation as the
previous exam. Again demonstrated is infarction of the left para
median min tele superiorly at the junction with the pons. The area
appears slightly larger, but this may relate to less motion and
evolution of the infarction. No new area of infarction is seen. T2
signal abnormality is now present. No evidence of hemorrhage.

Elsewhere, mild chronic small-vessel disease of the cerebral
hemispheric white matter is unchanged. No hydrocephalus. I think
there is a small arachnoid cyst in the left perimesencephalic
cistern, not significant.
IMPRESSION: No new area of infarction. The acute infarction in the upper left
para median medulla has undergone expected evolutionary change, now
showing abnormal T2 signal. The area of restricted diffusion is
slightly larger than on the previous study, but there is less motion
degradation and slight swelling of the infarction could also
contribute to this. I cannot completely exclude mild marginal
extension, but think of the changes are evolutionary more likely.

These results will be called to the ordering clinician or
representative by the Radiologist Assistant, and communication
documented in the PACS or zVision Dashboard.

## 2016-04-24 ENCOUNTER — Encounter: Payer: BC Managed Care – PPO | Attending: Physical Medicine & Rehabilitation

## 2016-04-24 ENCOUNTER — Encounter: Payer: Self-pay | Admitting: Physical Medicine & Rehabilitation

## 2016-04-24 ENCOUNTER — Ambulatory Visit (HOSPITAL_BASED_OUTPATIENT_CLINIC_OR_DEPARTMENT_OTHER): Payer: BC Managed Care – PPO | Admitting: Physical Medicine & Rehabilitation

## 2016-04-24 VITALS — BP 175/89 | HR 74 | Resp 18

## 2016-04-24 DIAGNOSIS — R2689 Other abnormalities of gait and mobility: Secondary | ICD-10-CM | POA: Diagnosis not present

## 2016-04-24 DIAGNOSIS — E11319 Type 2 diabetes mellitus with unspecified diabetic retinopathy without macular edema: Secondary | ICD-10-CM | POA: Insufficient documentation

## 2016-04-24 DIAGNOSIS — I10 Essential (primary) hypertension: Secondary | ICD-10-CM | POA: Insufficient documentation

## 2016-04-24 DIAGNOSIS — G8110 Spastic hemiplegia affecting unspecified side: Secondary | ICD-10-CM

## 2016-04-24 DIAGNOSIS — R4702 Dysphasia: Secondary | ICD-10-CM | POA: Diagnosis not present

## 2016-04-24 DIAGNOSIS — H409 Unspecified glaucoma: Secondary | ICD-10-CM | POA: Diagnosis not present

## 2016-04-24 DIAGNOSIS — R200 Anesthesia of skin: Secondary | ICD-10-CM | POA: Diagnosis not present

## 2016-04-24 NOTE — Progress Notes (Signed)
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 This includes bleeding, bruising, infection, excessive weakness, or medication side effects. A REMS form is on file and signed. Needle: 2" 25g needle electrode Number of units per muscle 700U in pect, 200 in pronator, 100 U trap  All injections were done after obtaining appropriate EMG activity and after negative drawback for blood. The patient tolerated the procedure well. Post procedure instructions were given. A followup appointment was made.

## 2016-04-24 NOTE — Patient Instructions (Signed)

## 2016-04-30 ENCOUNTER — Other Ambulatory Visit: Payer: Self-pay | Admitting: Internal Medicine

## 2016-05-01 ENCOUNTER — Ambulatory Visit (INDEPENDENT_AMBULATORY_CARE_PROVIDER_SITE_OTHER): Payer: BC Managed Care – PPO | Admitting: Adult Health

## 2016-05-01 ENCOUNTER — Encounter: Payer: Self-pay | Admitting: Adult Health

## 2016-05-01 VITALS — BP 158/78 | Temp 98.1°F | Ht 69.0 in | Wt 168.0 lb

## 2016-05-01 DIAGNOSIS — Z794 Long term (current) use of insulin: Secondary | ICD-10-CM

## 2016-05-01 DIAGNOSIS — Z7689 Persons encountering health services in other specified circumstances: Secondary | ICD-10-CM

## 2016-05-01 DIAGNOSIS — Z7189 Other specified counseling: Secondary | ICD-10-CM | POA: Diagnosis not present

## 2016-05-01 DIAGNOSIS — I10 Essential (primary) hypertension: Secondary | ICD-10-CM

## 2016-05-01 DIAGNOSIS — E1140 Type 2 diabetes mellitus with diabetic neuropathy, unspecified: Secondary | ICD-10-CM | POA: Diagnosis not present

## 2016-05-01 LAB — POCT GLYCOSYLATED HEMOGLOBIN (HGB A1C): Hemoglobin A1C: 7.3

## 2016-05-01 NOTE — Patient Instructions (Addendum)
It was great seeing you again!  Your A1c was 7.3  Continue to eat healthy and exercise  Please let me know if you have a medication called Norvasc or Amlodipine at home.   Follow up with me for your physical.

## 2016-05-01 NOTE — Progress Notes (Signed)
Patient presents to clinic today to establish care. 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).   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 He had some improvements in terms of right hand pain is well his shoulder pain after a series of stellate ganglion blocks performed by Dr. Hamilton Capri. He continues to have decreased strength in right hand   DM - He his checks his blood sugars at home. This morning it was 140. He does not know his average.   Hypertension  - Not well controlled. He is unsure if he is taking Norvasc. He will let me know.   Health Maintenance: Dental -- Routine care Vision -- Routine care and for Glaucoma  Immunizations -- UTD  Colonoscopy -- 2016 - at [facility] ( I do not have these records)  Diet: He eats healthy Exercise: Walks in the neighborhood.    Is followed by:  Dr. Sherlynn Stalls - for glaucoma  Dr. Alysia Penna - Physical Medicine and Rehabilitation  Past Medical History:  Diagnosis Date  . 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 Diabetes mellitus type II   . Diabetic retinopathy FOLLOWED BY DR Zadie Rhine  . ED (erectile dysfunction)   . Glaucoma of both eyes   . Hypertension   . Left hydrocele   . Stroke [facility])     Past Surgical History:  Procedure Laterality Date  . APPENDECTOMY  AGE EARLY 20'S  . HYDROCELE EXCISION  03/31/2012   Procedure: HYDROCELECTOMY ADULT;  Surgeon: Franchot Gallo, MD;  Location: [facility];  Service: Urology;  Laterality: Left;  45 MINS    . LEFT EYE LASER RETINA REPAIR  SEPT 2012  . RIGHT  EYE VITRECTOMY/ INSERTION GLAUCOMA SETON/ LASER REPAIR  12-13-2008   RETINAL ARTERY OCCLUSION /NEOVASCULAR GLAUCOMA/ HEMORRHAGE  . RIGHT EYE VITRETOMY/ INSERTION GLAUCOMA SETON X2/ LASER  03-24-2009   RECURRENT HEMORRHAGE/ OCCLUSION INTERNAL SETON  . undescended right testicle removed  1994    Current Outpatient Prescriptions on File Prior to Visit  Medication Sig Dispense Refill  . amLODipine (NORVASC) 2.5 MG tablet Take 1 tablet (2.5 mg total) by mouth daily. 90 tablet 3  . aspirin 325 MG tablet Take 1 tablet (325 mg total) by mouth daily. 30 tablet 0  . atorvastatin (LIPITOR) 10 MG tablet Take 1 tablet (10 mg total) by mouth daily at 6 PM. 90 tablet 1  . glucose blood (FREESTYLE LITE) test strip Test once daily dx E10.9 100 each 12  . Insulin Glargine (LANTUS SOLOSTAR) 100 UNIT/ML Solostar Pen INJECT 23 UNITS SUBCUTANEOUSLY AT BEDTIME 15 pen 1  . insulin glargine (LANTUS) 100 UNIT/ML injection Inject 0.24 mLs (24 Units total) into the skin at bedtime. 10 mL 11  . losartan-hydrochlorothiazide (HYZAAR) 100-12.5 MG tablet Take 1 tablet by mouth daily. 90 tablet 1  . metFORMIN (GLUCOPHAGE) 1000 MG tablet TAKE 1 TABLET (1,000 MG TOTAL) BY MOUTH 2 (TWO) TIMES DAILY WITH A MEAL. 180 tablet 0  . tiZANidine (ZANAFLEX) 2 MG tablet Take 1 tablet twice a day and take 2 tablets at night 120 tablet 1   No current facility-administered medications on file prior to  visit.     Allergies  Allergen Reactions  . Lactose Intolerance (Gi) Diarrhea  . Apple Pectin [Pectin] Itching    ITCHY THROAT  . Peach [Prunus Persica] Itching    ITCHY THROAT  . Morphine And Related Anxiety    Jittery    Family History  Problem Relation Age of Onset  . Glaucoma Mother   . Diabetes Mother   . Stomach cancer Maternal Grandfather   . Stroke Maternal Grandmother     Social History   Social History  . Marital status: Married    Spouse name: N/A  . Number of children: 1  . Years of education: 5    Occupational History  . Disabled    Social History Main Topics  . Smoking status: Never Smoker  . Smokeless tobacco: Never Used  . Alcohol use No  . Drug use: No  . Sexual activity: Not on file   Other Topics Concern  . Not on file   Social History Narrative   Lives at home with his wife and granddaughter.   Left-handed.   3 cups caffeine per day.    Review of Systems  Constitutional: Negative.   HENT: Negative.   Eyes:       Blind in right eye   Respiratory: Negative.   Cardiovascular: Negative.   Genitourinary: Negative.   Musculoskeletal: Negative.   Skin: Negative.   Neurological: Negative.   Psychiatric/Behavioral: Negative.   All other systems reviewed and are negative.   BP (!) 158/78   Temp 98.1 F (36.7 C) (Oral)   Ht 5\' 9"  (1.753 m)   Wt 168 lb (76.2 kg)   BMI 24.81 kg/m   Physical Exam  Constitutional: He is oriented to person, place, and time and well-developed, well-nourished, and in no distress. No distress.  HENT:  Head: Normocephalic and atraumatic.  Right Ear: External ear normal.  Left Ear: External ear normal.  Nose: Nose normal.  Mouth/Throat: Oropharynx is clear and moist. No oropharyngeal exudate.  Eyes:  Right iris opacified, absent vision R eye    Neck: Normal range of motion. Neck supple. No thyromegaly present.  Cardiovascular: Normal rate, regular rhythm, normal heart sounds and intact distal pulses.  Exam reveals no gallop and no friction rub.   No murmur heard. Pulmonary/Chest: Effort normal and breath sounds normal. No respiratory distress. He has no wheezes. He has no rales. He exhibits no tenderness.  Musculoskeletal: He exhibits no edema or tenderness.  Lymphadenopathy:    He has no cervical adenopathy.  Neurological: He is alert and oriented to person, place, and time. He has normal reflexes. He displays normal reflexes. No cranial nerve deficit. He exhibits normal muscle tone. Gait normal. Coordination normal. GCS score  is 15.  Skin: Skin is warm and dry. No rash noted. He is not diaphoretic. No erythema. No pallor.  Psychiatric: Mood, memory, affect and judgment normal.  Nursing note and vitals reviewed.   Assessment/Plan: 1. Encounter to establish care - Follow up for yearly CPE - Encouraged heart healthy diet  - Encouraged frequent and regular exercise  2. Essential hypertension - He will let me know if he has/ amlodipine at home and if he is taking it. If he is, then I will increase amlodipine to 10 mg  3. Type 2 diabetes mellitus with diabetic neuropathy, with long-term current use of insulin (HCC) - POC HgB A1c - 7.3 - Work on diet and exercise - No change in medications at this time  -  Will check at his CPE - Consider increasing insulin at that time  [name], NP

## 2016-06-05 ENCOUNTER — Ambulatory Visit: Payer: BC Managed Care – PPO | Admitting: Physical Medicine & Rehabilitation

## 2016-06-19 ENCOUNTER — Ambulatory Visit (INDEPENDENT_AMBULATORY_CARE_PROVIDER_SITE_OTHER): Payer: BC Managed Care – PPO | Admitting: Adult Health

## 2016-06-19 ENCOUNTER — Encounter: Payer: Self-pay | Admitting: Adult Health

## 2016-06-19 VITALS — BP 172/80 | Temp 97.7°F | Ht 69.0 in | Wt 168.1 lb

## 2016-06-19 DIAGNOSIS — Z0000 Encounter for general adult medical examination without abnormal findings: Secondary | ICD-10-CM | POA: Diagnosis not present

## 2016-06-19 DIAGNOSIS — R351 Nocturia: Secondary | ICD-10-CM | POA: Diagnosis not present

## 2016-06-19 DIAGNOSIS — I10 Essential (primary) hypertension: Secondary | ICD-10-CM | POA: Diagnosis not present

## 2016-06-19 DIAGNOSIS — E1140 Type 2 diabetes mellitus with diabetic neuropathy, unspecified: Secondary | ICD-10-CM | POA: Diagnosis not present

## 2016-06-19 DIAGNOSIS — Z794 Long term (current) use of insulin: Secondary | ICD-10-CM | POA: Diagnosis not present

## 2016-06-19 DIAGNOSIS — Z23 Encounter for immunization: Secondary | ICD-10-CM

## 2016-06-19 LAB — BASIC METABOLIC PANEL
BUN: 28 mg/dL — AB (ref 6–23)
CO2: 27 mEq/L (ref 19–32)
CREATININE: 1.18 mg/dL (ref 0.40–1.50)
Calcium: 9.7 mg/dL (ref 8.4–10.5)
Chloride: 107 mEq/L (ref 96–112)
GFR: 79.72 mL/min (ref >60.00)
Glucose, Bld: 81 mg/dL (ref 70–99)
POTASSIUM: 4.7 meq/L (ref 3.5–5.1)
Sodium: 144 mEq/L (ref 135–145)

## 2016-06-19 LAB — POC URINALSYSI DIPSTICK (AUTOMATED)
BILIRUBIN UA: NEGATIVE
GLUCOSE UA: NEGATIVE
KETONES UA: NEGATIVE
LEUKOCYTES UA: NEGATIVE
NITRITE UA: NEGATIVE
PH UA: 5
Spec Grav, UA: 1.025
Urobilinogen, UA: 0.2

## 2016-06-19 LAB — LIPID PANEL
CHOL/HDL RATIO: 3
Cholesterol: 98 mg/dL (ref 0–200)
HDL: 35 mg/dL — AB (ref >39.00)
LDL Cholesterol: 50 mg/dL (ref 0–99)
NONHDL: 63.35
Triglycerides: 68 mg/dL (ref 0.0–149.0)
VLDL: 13.6 mg/dL (ref 0.0–40.0)

## 2016-06-19 LAB — TSH: TSH: 2.08 u[IU]/mL (ref 0.35–4.50)

## 2016-06-19 LAB — CBC
HCT: 32.5 % — ABNORMAL LOW (ref 39.0–52.0)
HEMOGLOBIN: 10.9 g/dL — AB (ref 13.0–17.0)
MCHC: 33.6 g/dL (ref 30.0–36.0)
MCV: 89.3 fl (ref 78.0–100.0)
Platelets: 285 10*3/uL (ref 150.0–400.0)
RBC: 3.64 Mil/uL — AB (ref 4.22–5.81)
RDW: 12.8 % (ref 11.5–15.5)
WBC: 4.5 10*3/uL (ref 4.0–10.5)

## 2016-06-19 LAB — PSA: PSA: 0.54 ng/mL (ref 0.10–4.00)

## 2016-06-19 LAB — HEPATIC FUNCTION PANEL
ALK PHOS: 59 U/L (ref 39–117)
ALT: 11 U/L (ref 0–53)
AST: 15 U/L (ref 0–37)
Albumin: 4 g/dL (ref 3.5–5.2)
BILIRUBIN DIRECT: 0.3 mg/dL (ref 0.0–0.3)
Total Bilirubin: 1.1 mg/dL (ref 0.2–1.2)
Total Protein: 6.6 g/dL (ref 6.0–8.3)

## 2016-06-19 LAB — HEMOGLOBIN A1C: HEMOGLOBIN A1C: 7.2 % — AB (ref 4.6–6.5)

## 2016-06-19 MED ORDER — AMLODIPINE BESYLATE 5 MG PO TABS: 5.0000 mg | ORAL_TABLET | Freq: Every day | ORAL | 3 refills | Status: DC

## 2016-06-19 MED ORDER — INSULIN GLARGINE 100 UNIT/ML SOLOSTAR PEN: PEN_INJECTOR | SUBCUTANEOUS | 1 refills | Status: DC

## 2016-06-19 NOTE — Patient Instructions (Addendum)
It was great seeing you today!  I will follow up with you regarding your blood work  I refilled your Lantus Pen and I went up on the Norvasc from 2.5 mg to 5 mg   I would like you to follow up with me in 2 weeks for a blood pressure check.Please check BP at home and bring with you to the next appointment  Health Maintenance, Male A healthy lifestyle and preventative care can promote health and wellness.  Maintain regular health, dental, and eye exams.  Eat a healthy diet. Foods like vegetables, fruits, whole grains, low-fat dairy products, and lean protein foods contain the nutrients you need and are low in calories. Decrease your intake of foods high in solid fats, added sugars, and salt. Get information about a proper diet from your health care provider, if necessary.  Regular physical exercise is one of the most important things you can do for your health. Most adults should get at least 150 minutes of moderate-intensity exercise (any activity that increases your heart rate and causes you to sweat) each week. In addition, most adults need muscle-strengthening exercises on 2 or more days a week.   Maintain a healthy weight. The body mass index (BMI) is a screening tool to identify possible weight problems. It provides an estimate of body fat based on height and weight. Your health care provider can find your BMI and can help you achieve or maintain a healthy weight. For males 20 years and older:  A BMI below 18.5 is considered underweight.  A BMI of 18.5 to 24.9 is normal.  A BMI of 25 to 29.9 is considered overweight.  A BMI of 30 and above is considered obese.  Maintain normal blood lipids and cholesterol by exercising and minimizing your intake of saturated fat. Eat a balanced diet with plenty of fruits and vegetables. Blood tests for lipids and cholesterol should begin at age 41 and be repeated every 5 years. If your lipid or cholesterol levels are high, you are over age 60, or you  are at high risk for heart disease, you may need your cholesterol levels checked more frequently.Ongoing high lipid and cholesterol levels should be treated with medicines if diet and exercise are not working.  If you smoke, find out from your health care provider how to quit. If you do not use tobacco, do not start.  Lung cancer screening is recommended for adults aged 53-80 years who are at high risk for developing lung cancer because of a history of smoking. A yearly low-dose CT scan of the lungs is recommended for people who have at least a 30-pack-year history of smoking and are current smokers or have quit within the past 15 years. A pack year of smoking is smoking an average of 1 pack of cigarettes a day for 1 year (for example, a 30-pack-year history of smoking could mean smoking 1 pack a day for 30 years or 2 packs a day for 15 years). Yearly screening should continue until the smoker has stopped smoking for at least 15 years. Yearly screening should be stopped for people who develop a health problem that would prevent them from having lung cancer treatment.  If you choose to drink alcohol, do not have more than 2 drinks per day. One drink is considered to be 12 oz (360 mL) of beer, 5 oz (150 mL) of wine, or 1.5 oz (45 mL) of liquor.  Avoid the use of street drugs. Do not share needles with  anyone. Ask for help if you need support or instructions about stopping the use of drugs.  High blood pressure causes heart disease and increases the risk of stroke. High blood pressure is more likely to develop in:  People who have blood pressure in the end of the normal range (100-139/85-89 mm Hg).  People who are overweight or obese.  People who are African American.  If you are 30-59 years of age, have your blood pressure checked every 3-5 years. If you are 70 years of age or older, have your blood pressure checked every year. You should have your blood pressure measured twice--once when you are at  a hospital or clinic, and once when you are not at a hospital or clinic. Record the average of the two measurements. To check your blood pressure when you are not at a hospital or clinic, you can use:  An automated blood pressure machine at a pharmacy.  A home blood pressure monitor.  If you are 47-100 years old, ask your health care provider if you should take aspirin to prevent heart disease.  Diabetes screening involves taking a blood sample to check your fasting blood sugar level. This should be done once every 3 years after age 8 if you are at a normal weight and without risk factors for diabetes. Testing should be considered at a younger age or be carried out more frequently if you are overweight and have at least 1 risk factor for diabetes.  Colorectal cancer can be detected and often prevented. Most routine colorectal cancer screening begins at the age of 81 and continues through age 30. However, your health care provider may recommend screening at an earlier age if you have risk factors for colon cancer. On a yearly basis, your health care provider may provide home test kits to check for hidden blood in the stool. A small camera at the end of a tube may be used to directly examine the colon (sigmoidoscopy or colonoscopy) to detect the earliest forms of colorectal cancer. Talk to your health care provider about this at age 89 when routine screening begins. A direct exam of the colon should be repeated every 5-10 years through age 8, unless early forms of precancerous polyps or small growths are found.  People who are at an increased risk for hepatitis B should be screened for this virus. You are considered at high risk for hepatitis B if:  You were born in a country where hepatitis B occurs often. Talk with your health care provider about which countries are considered high risk.  Your parents were born in a high-risk country and you have not received a shot to protect against hepatitis B  (hepatitis B vaccine).  You have HIV or AIDS.  You use needles to inject street drugs.  You live with, or have sex with, someone who has hepatitis B.  You are a man who has sex with other men (MSM).  You get hemodialysis treatment.  You take certain medicines for conditions like cancer, organ transplantation, and autoimmune conditions.  Hepatitis C blood testing is recommended for all people born from [birth year] through 1965 and any individual with known risk factors for hepatitis C.  Healthy men should no longer receive prostate-specific antigen (PSA) blood tests as part of routine cancer screening. Talk to your health care provider about prostate cancer screening.  Testicular cancer screening is not recommended for adolescents or adult males who have no symptoms. Screening includes self-exam, a health care provider exam,  and other screening tests. Consult with your health care provider about any symptoms you have or any concerns you have about testicular cancer.  Practice safe sex. Use condoms and avoid high-risk sexual practices to reduce the spread of sexually transmitted infections (STIs).  You should be screened for STIs, including gonorrhea and chlamydia if:  You are sexually active and are younger than 24 years.  You are older than 24 years, and your health care provider tells you that you are at risk for this type of infection.  Your sexual activity has changed since you were last screened, and you are at an increased risk for chlamydia or gonorrhea. Ask your health care provider if you are at risk.  If you are at risk of being infected with HIV, it is recommended that you take a prescription medicine daily to prevent HIV infection. This is called pre-exposure prophylaxis (PrEP). You are considered at risk if:  You are a man who has sex with other men (MSM).  You are a heterosexual man who is sexually active with multiple partners.  You take drugs by injection.  You are  sexually active with a partner who has HIV.  Talk with your health care provider about whether you are at high risk of being infected with HIV. If you choose to begin PrEP, you should first be tested for HIV. You should then be tested every 3 months for as long as you are taking PrEP.  Use sunscreen. Apply sunscreen liberally and repeatedly throughout the day. You should seek shade when your shadow is shorter than you. Protect yourself by wearing long sleeves, pants, a wide-brimmed hat, and sunglasses year round whenever you are outdoors.  Tell your health care provider of new moles or changes in moles, especially if there is a change in shape or color. Also, tell your health care provider if a mole is larger than the size of a pencil eraser.  A one-time screening for abdominal aortic aneurysm (AAA) and surgical repair of large AAAs by ultrasound is recommended for men aged 27-75 years who are current or former smokers.  Stay current with your vaccines (immunizations).   This information is not intended to replace advice given to you by your health care provider. Make sure you discuss any questions you have with your health care provider.   Document Released: 02/16/2008 Document Revised: 09/10/2014 Document Reviewed: 01/15/2011 Elsevier Interactive Patient Education [employer].

## 2016-06-19 NOTE — Progress Notes (Signed)
Subjective:    Patient ID: Russell Austin, male    DOB: 06/26/1951, 65 y.o.   MRN: [id number]  HPI  Patient presents for yearly preventative medicine examination. 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   All immunizations and health maintenance protocols were reviewed with the patient and needed orders were placed.  Appropriate screening laboratory values were ordered for the patient including screening of hyperlipidemia, renal function and hepatic function. If indicated by BPH, a PSA was ordered.  Medication reconciliation,  past medical history, social history, problem list and allergies were reviewed in detail with the patient  Goals were established with regard to weight loss, exercise, and  diet in compliance with medications. He is walking daily and tries to eat healthy.   End of life planning was discussed and an advanced directive and living will was given   He continues to check his blood sugars at home and reports that " they have been high this week". This is from his diet.   He has chronic pain affecting the right side of his body. His pain constantly. He is doing injections at Dr. Aretta Nip office. He is also taking Gabapentin nightly. He reports that he does not feel like this is helping much.   His blood pressure is not well controlled currently. He is currently taking Norvasc and Amlodipine   He has an acute complaint of  " since I had my stroke, I am staying cold all the time."   He is also complaining of nocturia, he states " I have been getting up to go in the middle of the night 2-3 times per night. Denies decrease in stream    Review of Systems  Constitutional: Negative.   HENT: Negative.   Eyes:  Negative.        Blind in right eye   Respiratory: Negative.   Cardiovascular: Negative.   Gastrointestinal: Negative.   Endocrine: Negative.   Genitourinary: Positive for frequency and urgency.       Nocturia   Musculoskeletal: Positive for arthralgias, gait problem and myalgias.  Skin: Negative.   Allergic/Immunologic: Negative.   Hematological: Negative.   Psychiatric/Behavioral: Negative.   All other systems reviewed and are negative.  Past Medical History:  Diagnosis Date  . 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 Diabetes mellitus type II   . Diabetic retinopathy FOLLOWED BY DR Zadie Rhine  . ED (erectile dysfunction)   . Glaucoma of both eyes   . Hypertension   . Left hydrocele   . Stroke [facility])     Social History   Social History  . Marital status: Married    Spouse name: N/A  . Number of children: 1  . Years of education: 67   Occupational History  . Disabled    Social History Main Topics  . Smoking status: Never Smoker  . Smokeless tobacco: Never Used  . Alcohol use No  . Drug use: No  . Sexual activity: Not on file   Other Topics Concern  . Not on file   Social History Narrative   Lives at home with his wife and granddaughter.   Left-handed.   3 cups caffeine per day.       Past Surgical History:  Procedure  Laterality Date  . APPENDECTOMY  AGE EARLY 20'S  . HYDROCELE EXCISION  03/31/2012   Procedure: HYDROCELECTOMY ADULT;  Surgeon: Franchot Gallo, MD;  Location: [facility];  Service: Urology;  Laterality: Left;  45 MINS    . LEFT EYE LASER RETINA REPAIR  SEPT 2012  . RIGHT EYE VITRECTOMY/ INSERTION GLAUCOMA SETON/ LASER REPAIR  12-13-2008   RETINAL ARTERY OCCLUSION /NEOVASCULAR GLAUCOMA/ HEMORRHAGE  . RIGHT EYE VITRETOMY/ INSERTION GLAUCOMA SETON X2/ LASER  03-24-2009   RECURRENT HEMORRHAGE/ OCCLUSION INTERNAL SETON  . SHOULDER ARTHROSCOPY Right 2005  . undescended right testicle removed  1994    Family  History  Problem Relation Age of Onset  . Glaucoma Mother   . Diabetes Mother   . Stomach cancer Maternal Grandfather   . Stroke Maternal Grandmother     Allergies  Allergen Reactions  . Lactose Intolerance (Gi) Diarrhea  . Apple Pectin [Pectin] Itching    ITCHY THROAT  . Peach [Prunus Persica] Itching    ITCHY THROAT  . Morphine And Related Anxiety    Jittery    Current Outpatient Prescriptions on File Prior to Visit  Medication Sig Dispense Refill  . aspirin 325 MG tablet Take 1 tablet (325 mg total) by mouth daily. 30 tablet 0  . atorvastatin (LIPITOR) 10 MG tablet Take 1 tablet (10 mg total) by mouth daily at 6 PM. 90 tablet 1  . glucose blood (FREESTYLE LITE) test strip Test once daily dx E10.9 100 each 12  . losartan-hydrochlorothiazide (HYZAAR) 100-12.5 MG tablet Take 1 tablet by mouth daily. 90 tablet 1  . metFORMIN (GLUCOPHAGE) 1000 MG tablet TAKE 1 TABLET (1,000 MG TOTAL) BY MOUTH 2 (TWO) TIMES DAILY WITH A MEAL. 180 tablet 0  . tiZANidine (ZANAFLEX) 2 MG tablet Take 1 tablet twice a day and take 2 tablets at night 120 tablet 1   No current facility-administered medications on file prior to visit.     BP (!) 172/80   Temp 97.7 F (36.5 C) (Oral)   Ht 5\' 9"  (1.753 m)   Wt 168 lb 1.6 oz (76.2 kg)   BMI 24.82 kg/m       Objective:   Physical Exam  Constitutional: He is oriented to person, place, and time. He appears well-developed and well-nourished. No distress.  HENT:  Head: Normocephalic and atraumatic.  Right Ear: External ear normal.  Left Ear: External ear normal.  Nose: Nose normal.  Mouth/Throat: Oropharynx is clear and moist. No oropharyngeal exudate.  Eyes: Conjunctivae and EOM are normal. Pupils are equal, round, and reactive to light. Right eye exhibits no discharge. Left eye exhibits no discharge. No scleral icterus.  Right iris opacified, absent vision R eye     Neck: Normal range of motion. Neck supple. No JVD present. Carotid bruit is not  present. No tracheal deviation present. No thyromegaly present.  Cardiovascular: Normal rate, regular rhythm, normal heart sounds and intact distal pulses.  Exam reveals no gallop and no friction rub.   No murmur heard. Pulmonary/Chest: Effort normal and breath sounds normal. No stridor. No respiratory distress. He has no wheezes. He has no rales. He exhibits no tenderness.  Abdominal: Soft. Bowel sounds are normal. He exhibits no distension and no mass. There is no tenderness. There is no rebound and no guarding.  Genitourinary: Rectal exam shows external hemorrhoid. Rectal exam shows guaiac negative stool. Prostate is enlarged. Prostate is not tender.  Musculoskeletal: Normal range of motion. He exhibits deformity (contracture of right hand.  Loss of ROM of right arm ). He exhibits no edema or tenderness.  Lymphadenopathy:    He has no cervical adenopathy.  Neurological: He is alert and oriented to person, place, and time. He has normal reflexes. He displays normal reflexes. No cranial nerve deficit. He exhibits normal muscle tone. Coordination normal.  Skin: Skin is warm and dry. No rash noted. He is not diaphoretic. No erythema. No pallor.  Psychiatric: He has a normal mood and affect. His behavior is normal. Judgment and thought content normal.  Nursing note and vitals reviewed.     Assessment & Plan:  1. Routine general medical examination at a health care facility - POCT Urinalysis Dipstick (Automated) - PSA - Basic metabolic panel - CBC - Hemoglobin A1c - Hepatic function panel - Lipid panel - TSH  2. Essential hypertension - Not controlled.  - Did not take his meds this morning - Increased Norvasc from 2.5 mg to 5mg .  - Will have him keep log and follow up in 2 weeks - POCT Urinalysis Dipstick (Automated) - PSA - Basic metabolic panel - CBC - Hemoglobin A1c - Hepatic function panel - Lipid panel - TSH - amLODipine (NORVASC) 5 MG tablet; Take 1 tablet (5 mg total) by  mouth daily.  Dispense: 90 tablet; Refill: 3  3. Type 2 diabetes mellitus with diabetic neuropathy, with long-term current use of insulin (HCC)  - POCT Urinalysis Dipstick (Automated) - PSA - Basic metabolic panel - CBC - Hemoglobin A1c - Hepatic function panel - Lipid panel - TSH - Insulin Glargine (LANTUS SOLOSTAR) 100 UNIT/ML Solostar Pen; INJECT 23 UNITS SUBCUTANEOUSLY AT BEDTIME  Dispense: 15 pen; Refill: 1 - Needs to start increasing exercise and eating healthy  - Follow up in 3 months  - Consider increasing Lantus   4. Nocturia - Sample of Myrbetriq given    [name], NP

## 2016-06-20 ENCOUNTER — Telehealth: Payer: Self-pay | Admitting: Physical Medicine & Rehabilitation

## 2016-06-20 NOTE — Telephone Encounter (Signed)
Patient is calling Russell Austin he is trying to get his medication for Tizanidine and it going to cost him a lot of money, and he is wanting to know if there is something cheaper that he can prescribe for him.  I did tell him that he needed to make an appointment, he hasn't been seen since August and his October appointment was canceled.  Please call patient at [phone].

## 2016-06-20 NOTE — Telephone Encounter (Signed)
Patient is changing to a medicare rx coverage and was told that the co-pay will equal $63.00 for monthly tizanidine rx's. I informed the patient that there is not much we can do until the switch in coverage is complete and that we will need updated info in our system.  Patient agreed that he will need to call back as soon as he gets his new insurance information

## 2016-06-21 ENCOUNTER — Telehealth: Payer: Self-pay

## 2016-06-21 NOTE — Telephone Encounter (Signed)
Pt called with questions about his Lantus. Pt states that when he was in the hospital he was only taking it when his blood sugar was >200. Pt reports he has not been taking this medication daily. Per [name], since pt has NOT been taking the medication daily he would like to have him take Lantus 10U qhs. Pt voiced understanding. Nothing further needed.   Melburn Hake Cox, CMA

## 2016-07-03 ENCOUNTER — Ambulatory Visit: Payer: BC Managed Care – PPO | Admitting: Adult Health

## 2016-07-23 LAB — HM DIABETES EYE EXAM

## 2016-07-24 ENCOUNTER — Encounter: Payer: Self-pay | Admitting: Adult Health

## 2016-08-14 ENCOUNTER — Ambulatory Visit (INDEPENDENT_AMBULATORY_CARE_PROVIDER_SITE_OTHER): Payer: BC Managed Care – PPO | Admitting: Adult Health

## 2016-08-14 ENCOUNTER — Encounter: Payer: Self-pay | Admitting: Adult Health

## 2016-08-14 DIAGNOSIS — I10 Essential (primary) hypertension: Secondary | ICD-10-CM | POA: Diagnosis not present

## 2016-08-14 MED ORDER — METOPROLOL TARTRATE 25 MG PO TABS: 25.0000 mg | ORAL_TABLET | Freq: Two times a day (BID) | ORAL | 1 refills | Status: DC

## 2016-08-14 MED ORDER — AMLODIPINE BESYLATE 10 MG PO TABS: 10.0000 mg | ORAL_TABLET | Freq: Every day | ORAL | 1 refills | Status: DC

## 2016-08-14 NOTE — Progress Notes (Signed)
Subjective:    Patient ID: Russell Austin, male    DOB: 10-Mar-1951, 65 y.o.   MRN: [id number]  HPI  65 year old male who presents to the office today for follow up regarding hypertension. He has been taking his medications as directed. He does not check his blood pressures regularly, but when he does his blood pressures have been in the 170's.   On the first blood pressure reading in the office today was 220/92 and second was 176/82   He denies any headaches     Review of Systems  Constitutional: Negative.   Respiratory: Negative.   Cardiovascular: Negative.   Neurological: Negative.   All other systems reviewed and are negative.  Past Medical History:  Diagnosis Date  . 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 Diabetes mellitus type II   . Diabetic retinopathy FOLLOWED BY DR Zadie Rhine  . ED (erectile dysfunction)   . Glaucoma of both eyes   . Hypertension   . Left hydrocele   . Stroke [facility])     Social History   Social History  . Marital status: Married    Spouse name: N/A  . Number of children: 1  . Years of education: 51   Occupational History  . Disabled    Social History Main Topics  . Smoking status: Never Smoker  . Smokeless tobacco: Never Used  . Alcohol use No  . Drug use: No  . Sexual activity: Not on file   Other Topics Concern  . Not on file   Social History Narrative   Lives at home with his wife and granddaughter.   Left-handed.   3 cups caffeine per day.       Past Surgical History:  Procedure Laterality Date  . APPENDECTOMY  AGE EARLY 20'S  . HYDROCELE EXCISION  03/31/2012   Procedure: HYDROCELECTOMY ADULT;  Surgeon: Franchot Gallo, MD;  Location: [facility];  Service: Urology;  Laterality: Left;  45 MINS    . LEFT EYE LASER RETINA REPAIR  SEPT 2012  . RIGHT EYE VITRECTOMY/ INSERTION GLAUCOMA SETON/ LASER REPAIR  12-13-2008   RETINAL ARTERY OCCLUSION /NEOVASCULAR GLAUCOMA/ HEMORRHAGE  . RIGHT  EYE VITRETOMY/ INSERTION GLAUCOMA SETON X2/ LASER  03-24-2009   RECURRENT HEMORRHAGE/ OCCLUSION INTERNAL SETON  . SHOULDER ARTHROSCOPY Right 2005  . undescended right testicle removed  1994    Family History  Problem Relation Age of Onset  . Glaucoma Mother   . Diabetes Mother   . Stomach cancer Maternal Grandfather   . Stroke Maternal Grandmother     Allergies  Allergen Reactions  . Lactose Intolerance (Gi) Diarrhea  . Apple Pectin [Pectin] Itching    ITCHY THROAT  . Peach [Prunus Persica] Itching    ITCHY THROAT  . Morphine And Related Anxiety    Jittery    Current Outpatient Prescriptions on File Prior to Visit  Medication Sig Dispense Refill  . aspirin 325 MG tablet Take 1 tablet (325 mg total) by mouth daily. 30 tablet 0  . atorvastatin (LIPITOR) 10 MG tablet Take 1 tablet (10 mg total) by mouth daily at 6 PM. 90 tablet 1  . glucose blood (FREESTYLE LITE) test strip Test once daily dx E10.9 100 each 12  . Insulin Glargine (LANTUS SOLOSTAR) 100 UNIT/ML Solostar Pen INJECT 23 UNITS SUBCUTANEOUSLY AT BEDTIME 15 pen 1  . losartan-hydrochlorothiazide (HYZAAR) 100-12.5 MG tablet Take 1 tablet by mouth daily. 90 tablet 1  . metFORMIN (GLUCOPHAGE) 1000  MG tablet TAKE 1 TABLET (1,000 MG TOTAL) BY MOUTH 2 (TWO) TIMES DAILY WITH A MEAL. 180 tablet 0  . tiZANidine (ZANAFLEX) 2 MG tablet Take 1 tablet twice a day and take 2 tablets at night 120 tablet 1   No current facility-administered medications on file prior to visit.     BP (!) 176/82 (BP Location: Right Arm, Patient Position: Sitting, Cuff Size: Normal)   Pulse (!) 106   Temp 98.9 F (37.2 C) (Oral)   Ht 5\' 9"  (1.753 m)   Wt 168 lb 3.2 oz (76.3 kg)   SpO2 99%   BMI 24.84 kg/m       Objective:   Physical Exam  Constitutional: He is oriented to person, place, and time. He appears well-developed and well-nourished. No distress.  Eyes: Conjunctivae and EOM are normal. Pupils are equal, round, and reactive to light.  Right eye exhibits no discharge. Left eye exhibits no discharge. Scleral icterus is present.  Cardiovascular: Normal rate, regular rhythm, normal heart sounds and intact distal pulses.  Exam reveals no gallop and no friction rub.   No murmur heard. Pulmonary/Chest: Effort normal and breath sounds normal. No respiratory distress. He has no wheezes. He has no rales. He exhibits no tenderness.  Musculoskeletal: Normal range of motion.  Neurological: He is alert and oriented to person, place, and time. He has normal reflexes. He displays normal reflexes. No cranial nerve deficit. He exhibits normal muscle tone. Coordination normal.  Skin: Skin is warm and dry. No rash noted. He is not diaphoretic. No erythema. No pallor.  Psychiatric: He has a normal mood and affect. His behavior is normal. Judgment and thought content normal.  Nursing note and vitals reviewed.     Assessment & Plan:  1. Essential hypertension - Increase Amlodipine from 5 mg to 10 mg and will add Lopressor 25 mg BID - amLODipine (NORVASC) 10 MG tablet; Take 1 tablet (10 mg total) by mouth daily.  Dispense: 90 tablet; Refill: 1 - metoprolol tartrate (LOPRESSOR) 25 MG tablet; Take 1 tablet (25 mg total) by mouth 2 (two) times daily.  Dispense: 180 tablet; Refill: 1 - Follow up in 2 weeks  [name], NP

## 2016-08-14 NOTE — Progress Notes (Signed)
Pre visit review using our clinic review tool, if applicable. No additional management support is needed unless otherwise documented below in the visit note. 

## 2016-08-14 NOTE — Patient Instructions (Signed)
It was great seeing you today   Your blood pressure is still high, I am going to increase your Amlodipine from 5 mg to 10 mg.   I am also going to add a medication called Metoprolol, take this twice a day . Please start monitoring your blood pressure twice a day and bring the log to the next visit.   Follow up in 2 weeks

## 2016-08-16 ENCOUNTER — Telehealth: Payer: Self-pay | Admitting: Adult Health

## 2016-08-16 ENCOUNTER — Other Ambulatory Visit: Payer: Self-pay

## 2016-08-16 MED ORDER — BLOOD GLUCOSE METER KIT: PACK | 0 refills | Status: DC

## 2016-08-16 NOTE — Telephone Encounter (Signed)
Rx has been faxed in.

## 2016-08-16 NOTE — Telephone Encounter (Signed)
Pt need new Rx for a Freestyle monitor pt state the old one has stopped working as of today.  Pharm:  [place] [facility]

## 2016-08-20 ENCOUNTER — Other Ambulatory Visit: Payer: Self-pay | Admitting: *Deleted

## 2016-08-22 ENCOUNTER — Other Ambulatory Visit: Payer: Self-pay | Admitting: Adult Health

## 2016-08-22 NOTE — Telephone Encounter (Signed)
Ok to renew for 6 months

## 2016-08-22 NOTE — Telephone Encounter (Signed)
This medication was prescribed on 03/20/16 by Dr. Letta Pate - ok to refill?

## 2016-08-23 ENCOUNTER — Other Ambulatory Visit: Payer: Self-pay | Admitting: Emergency Medicine

## 2016-08-23 ENCOUNTER — Telehealth: Payer: Self-pay

## 2016-08-23 NOTE — Telephone Encounter (Signed)
Received PA for Lantus pens from [facility] on [address]. PA submitted & is pending. Key: [id number]

## 2016-08-23 NOTE — Telephone Encounter (Signed)
Pt Lantus Solostar 100 unit/ml rejected by insurance

## 2016-08-24 NOTE — Telephone Encounter (Signed)
PA approved, form faxed back to pharmacy. 

## 2016-08-28 ENCOUNTER — Other Ambulatory Visit: Payer: Self-pay | Admitting: Internal Medicine

## 2016-08-28 ENCOUNTER — Ambulatory Visit (INDEPENDENT_AMBULATORY_CARE_PROVIDER_SITE_OTHER): Payer: BC Managed Care – PPO | Admitting: Adult Health

## 2016-08-28 ENCOUNTER — Encounter: Payer: Self-pay | Admitting: Adult Health

## 2016-08-28 DIAGNOSIS — I10 Essential (primary) hypertension: Secondary | ICD-10-CM

## 2016-08-28 NOTE — Patient Instructions (Signed)
Increase Lopressor from 25 mg to 50 mg, twice daily.   Call and let Jerene Pitch know what they are in one week.   Follow up in February for blood pressure and A1c check,

## 2016-08-28 NOTE — Progress Notes (Signed)
Subjective:    Patient ID: Russell Austin, male    DOB: Aug 16, 1951, 65 y.o.   MRN: [id number]  HPI  65 year old male who presents to the office today for one month follow up regarding hypertension. During the last visit his blood pressure was in 294 systolic in the office. Amlodipine was increased to 10 mg and Metoprolol 25 mg BID was added to Hyzaar 100-12.13m. He is monitoring his blood pressure at home and reports that his blood pressures at home have been in the 1765'Ysystolic.   He denies any headaches or blurred vision.   Review of Systems  Constitutional: Negative.   Respiratory: Negative.   Cardiovascular: Negative.   Neurological: Negative.   Hematological: Negative.   All other systems reviewed and are negative.  Past Medical History:  Diagnosis Date  . 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 Diabetes mellitus type II   . Diabetic retinopathy FOLLOWED BY DR RZadie Rhine . ED (erectile dysfunction)   . Glaucoma of both eyes   . Hypertension   . Left hydrocele   . Stroke ([facility] [place]     Social History   Social History  . Marital status: Married    Spouse name: N/A  . Number of children: 1  . Years of education: 148  Occupational History  . Disabled    Social History Main Topics  . Smoking status: Never Smoker  . Smokeless tobacco: Never Used  . Alcohol use No  . Drug use: No  . Sexual activity: Not on file   Other Topics Concern  . Not on file   Social History Narrative   Lives at home with his wife and granddaughter.   Left-handed.   3 cups caffeine per day.       Past Surgical History:  Procedure Laterality Date  . APPENDECTOMY  AGE EARLY 20'S  . HYDROCELE EXCISION  03/31/2012   Procedure: HYDROCELECTOMY ADULT;  Surgeon: SFranchot Gallo MD;  [facility]  Service: Urology;  Laterality: Left;  45 MINS    . LEFT EYE LASER RETINA REPAIR  SEPT 2012  . RIGHT EYE VITRECTOMY/ INSERTION GLAUCOMA SETON/ LASER  REPAIR  12-13-2008   RETINAL ARTERY OCCLUSION /NEOVASCULAR GLAUCOMA/ HEMORRHAGE  . RIGHT EYE VITRETOMY/ INSERTION GLAUCOMA SETON X2/ LASER  03-24-2009   RECURRENT HEMORRHAGE/ OCCLUSION INTERNAL SETON  . SHOULDER ARTHROSCOPY Right 2005  . undescended right testicle removed  1994    Family History  Problem Relation Age of Onset  . Glaucoma Mother   . Diabetes Mother   . Stomach cancer Maternal Grandfather   . Stroke Maternal Grandmother     Allergies  Allergen Reactions  . Lactose Intolerance (Gi) Diarrhea  . Apple Pectin [Pectin] Itching    ITCHY THROAT  . Peach [Prunus Persica] Itching    ITCHY THROAT  . Morphine And Related Anxiety    Jittery    Current Outpatient Prescriptions on File Prior to Visit  Medication Sig Dispense Refill  . amLODipine (NORVASC) 10 MG tablet Take 1 tablet (10 mg total) by mouth daily. 90 tablet 1  . aspirin 325 MG tablet Take 1 tablet (325 mg total) by mouth daily. 30 tablet 0  . atorvastatin (LIPITOR) 10 MG tablet Take 1 tablet (10 mg total) by mouth daily at 6 PM. 90 tablet 1  . blood glucose meter kit and supplies Dispense based on Insurance. Test blood sugars once daily. Dx: E10.9 1 each 0  .  gabapentin (NEURONTIN) 300 MG capsule TAKE 1 CAPSULE (300 MG TOTAL) BY MOUTH AT BEDTIME. 90 capsule 1  . glucose blood (FREESTYLE LITE) test strip Test once daily dx E10.9 100 each 12  . Insulin Glargine (LANTUS SOLOSTAR) 100 UNIT/ML Solostar Pen INJECT 23 UNITS SUBCUTANEOUSLY AT BEDTIME 15 pen 1  . losartan-hydrochlorothiazide (HYZAAR) 100-12.5 MG tablet Take 1 tablet by mouth daily. 90 tablet 1  . metFORMIN (GLUCOPHAGE) 1000 MG tablet TAKE 1 TABLET (1,000 MG TOTAL) BY MOUTH 2 (TWO) TIMES DAILY WITH A MEAL. 180 tablet 0  . metoprolol tartrate (LOPRESSOR) 25 MG tablet Take 1 tablet (25 mg total) by mouth 2 (two) times daily. 180 tablet 1  . tiZANidine (ZANAFLEX) 2 MG tablet Take 1 tablet twice a day and take 2 tablets at night 120 tablet 1   No current  facility-administered medications on file prior to visit.     BP 140/90   Temp 97.8 F (36.6 C) (Oral)   Ht 5' 9"  (1.753 m)   Wt 174 lb 7 oz (79.1 kg)   BMI 25.76 kg/m       Objective:   Physical Exam  Constitutional: He is oriented to person, place, and time. He appears well-developed and well-nourished. No distress.  Cardiovascular: Normal rate, regular rhythm, normal heart sounds and intact distal pulses.  Exam reveals no gallop.   No murmur heard. Pulmonary/Chest: Effort normal and breath sounds normal. No respiratory distress. He has no wheezes. He has no rales. He exhibits no tenderness.  Neurological: He is alert and oriented to person, place, and time.  Skin: Skin is warm and dry. No rash noted. He is not diaphoretic. No erythema. No pallor.  Psychiatric: He has a normal mood and affect. His behavior is normal. Judgment and thought content normal.  Nursing note and vitals reviewed.     Assessment & Plan:  1. Essential hypertension -  Will increase Metoprolol to 50 mg BID - Call back in one week to let me know how is blood pressure readings have been  - Follow up in one month for A1c check and we will check blood pressure then too  [name], NP

## 2016-08-28 NOTE — Progress Notes (Signed)
Pre visit review using our clinic review tool, if applicable. No additional management support is needed unless otherwise documented below in the visit note. 

## 2016-09-04 DIAGNOSIS — E113312 Type 2 diabetes mellitus with moderate nonproliferative diabetic retinopathy with macular edema, left eye: Secondary | ICD-10-CM | POA: Diagnosis not present

## 2016-09-04 DIAGNOSIS — H3582 Retinal ischemia: Secondary | ICD-10-CM | POA: Diagnosis not present

## 2016-09-27 ENCOUNTER — Encounter: Payer: Self-pay | Admitting: Adult Health

## 2016-09-27 ENCOUNTER — Ambulatory Visit (INDEPENDENT_AMBULATORY_CARE_PROVIDER_SITE_OTHER): Payer: Medicare Other | Admitting: Adult Health

## 2016-09-27 VITALS — BP 174/80 | Temp 97.8°F | Ht 69.0 in | Wt 175.0 lb

## 2016-09-27 DIAGNOSIS — Z794 Long term (current) use of insulin: Secondary | ICD-10-CM

## 2016-09-27 DIAGNOSIS — I10 Essential (primary) hypertension: Secondary | ICD-10-CM | POA: Diagnosis not present

## 2016-09-27 DIAGNOSIS — R600 Localized edema: Secondary | ICD-10-CM

## 2016-09-27 DIAGNOSIS — E1140 Type 2 diabetes mellitus with diabetic neuropathy, unspecified: Secondary | ICD-10-CM | POA: Diagnosis not present

## 2016-09-27 LAB — POCT GLYCOSYLATED HEMOGLOBIN (HGB A1C): HEMOGLOBIN A1C: 7.7

## 2016-09-27 MED ORDER — FUROSEMIDE 20 MG PO TABS: 20.0000 mg | ORAL_TABLET | ORAL | 0 refills | Status: DC

## 2016-09-27 NOTE — Progress Notes (Signed)
Subjective:    Patient ID: Russell Austin, male    DOB: Sep 06, 1950, 66 y.o.   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 He presents to the office today for the acute complaint of lower extremity edema over the last two weeks. He reports trying to eat healthy and does not have a high sodium intake. He has been taking his medications as directed. He does not drink a lot of water throughout the day. When I last saw Nyles in December his blood pressure had improved greatly. Metoprolol was increased from 25 mg to 50 mg. He reports not doing this at home.   It is also time to check his A1c, which will be done during this visit. His last A1c in October was 7.2.   He denies any chest pain or SOB   Review of Systems  Constitutional: Negative.   Respiratory: Negative.   Cardiovascular: Positive for leg swelling.  Gastrointestinal: Negative.   Musculoskeletal: Negative.   Neurological: Negative.   All other systems reviewed and are negative.  Past Medical History:  Diagnosis Date  . 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 Diabetes mellitus type II   . Diabetic retinopathy FOLLOWED BY DR Zadie Rhine  . ED (erectile dysfunction)   . Glaucoma of both eyes   . Hypertension   . Left hydrocele   . Stroke [facility])     Social History   Social History  . Marital status: Married    Spouse name: N/A  . Number of children: 1  . Years of education: 32   Occupational History  . Disabled    Social History Main Topics  . Smoking status: Never Smoker  . Smokeless tobacco: Never Used  . Alcohol use No  . Drug use: No  . Sexual activity: Not on file   Other Topics Concern  . Not on file   Social History Narrative   Lives at home with his wife and granddaughter.   Left-handed.   3 cups caffeine per day.       Past Surgical History:  Procedure Laterality Date  . APPENDECTOMY  AGE EARLY 20'S  . HYDROCELE EXCISION  03/31/2012   Procedure: HYDROCELECTOMY ADULT;  Surgeon: Franchot Gallo, MD;  Location: [facility];  Service: Urology;  Laterality: Left;  45 MINS    . LEFT EYE LASER RETINA REPAIR  SEPT 2012  . RIGHT EYE VITRECTOMY/ INSERTION GLAUCOMA SETON/ LASER REPAIR  12-13-2008   RETINAL ARTERY OCCLUSION /NEOVASCULAR GLAUCOMA/ HEMORRHAGE  . RIGHT EYE VITRETOMY/ INSERTION GLAUCOMA SETON X2/ LASER  03-24-2009   RECURRENT HEMORRHAGE/ OCCLUSION INTERNAL SETON  . SHOULDER ARTHROSCOPY Right 2005  . undescended right testicle removed  1994    Family History  Problem Relation Age of Onset  . Glaucoma Mother   . Diabetes Mother   . Stomach cancer Maternal Grandfather   . Stroke Maternal Grandmother     Allergies  Allergen Reactions  . Lactose Intolerance (Gi) Diarrhea  . Apple Pectin [Pectin] Itching    ITCHY THROAT  . Peach [Prunus Persica] Itching    ITCHY THROAT  . Morphine And Related Anxiety    Jittery    Current Outpatient Prescriptions on File Prior to Visit  Medication Sig Dispense  Refill  . amLODipine (NORVASC) 10 MG tablet Take 1 tablet (10 mg total) by mouth daily. 90 tablet 1  . aspirin 325 MG tablet Take 1 tablet (325 mg total) by mouth daily. 30 tablet 0  . atorvastatin (LIPITOR) 10 MG tablet Take 1 tablet (10 mg total) by mouth daily at 6 PM. 90 tablet 1  . blood glucose meter kit and supplies Dispense based on Insurance. Test blood sugars once daily. Dx: E10.9 1 each 0  . gabapentin (NEURONTIN) 300 MG capsule TAKE 1 CAPSULE (300 MG TOTAL) BY MOUTH AT BEDTIME. 90 capsule 1  . glucose blood (FREESTYLE LITE) test strip Test once daily dx E10.9 100 each 12  . Insulin Glargine (LANTUS SOLOSTAR) 100 UNIT/ML Solostar Pen INJECT 23 UNITS SUBCUTANEOUSLY AT BEDTIME 15  pen 1  . losartan-hydrochlorothiazide (HYZAAR) 100-12.5 MG tablet Take 1 tablet by mouth daily. 90 tablet 1  . metFORMIN (GLUCOPHAGE) 1000 MG tablet TAKE 1 TABLET (1,000 MG TOTAL) BY MOUTH 2 (TWO) TIMES DAILY WITH A MEAL. 180 tablet 0  . metoprolol tartrate (LOPRESSOR) 25 MG tablet Take 1 tablet (25 mg total) by mouth 2 (two) times daily. 180 tablet 1  . tiZANidine (ZANAFLEX) 2 MG tablet Take 1 tablet twice a day and take 2 tablets at night 120 tablet 1   No current facility-administered medications on file prior to visit.     BP (!) 174/80   Temp 97.8 F (36.6 C) (Oral)   Ht 5' 9" (1.753 m)   Wt 175 lb (79.4 kg)   BMI 25.84 kg/m       Objective:   Physical Exam  Constitutional: He is oriented to person, place, and time. He appears well-developed and well-nourished. No distress.  Cardiovascular: Normal rate, regular rhythm, normal heart sounds and intact distal pulses.  Exam reveals no gallop.   No murmur heard. Pulmonary/Chest: Effort normal and breath sounds normal. No respiratory distress. He has no wheezes. He has no rales. He exhibits no tenderness.  Musculoskeletal: Normal range of motion. He exhibits edema. He exhibits no tenderness or deformity.  + 2 pitting edema in bilateral lower extremities   Neurological: He is alert and oriented to person, place, and time.  Skin: Skin is warm and dry. No rash noted. He is not diaphoretic. No erythema. No pallor.  Psychiatric: He has a normal mood and affect. His behavior is normal. Judgment and thought content normal.  Nursing note and vitals reviewed.     Assessment & Plan:  1. Lower extremity edema - Doubt medication related at this time  - Lasix 20 mg every other day  - Increase water consumption - Cut out sodium from diet - Elevate legs at night  - Follow up in two weeks   2. Essential hypertension - Not at goal - Take Metoprolol 50 mg in morning and 25 mg at night.  - Follow up in 2 weeks    3. Type 2 diabetes  mellitus with diabetic neuropathy, with long-term current use of insulin (HCC) - POC HgB A1c- 7.7  - Increase from 7.2  - Advised to work on diet and exercise   , NP   

## 2016-09-27 NOTE — Patient Instructions (Signed)
I am going to send in Lasix 20 mg, take this every other day   Also with the Metoprolol, I would like you to take 2 pills in the morning and 1 pill in the evening.   Increase your water intake to 6 bottles of water daily.   Elevate your legs at night.   Follow up in 2 weeks to see how you are doing.

## 2016-10-02 ENCOUNTER — Encounter: Payer: Self-pay | Admitting: Adult Health

## 2016-10-02 LAB — HM DIABETES EYE EXAM

## 2016-10-10 ENCOUNTER — Encounter: Payer: Self-pay | Admitting: Podiatry

## 2016-10-10 ENCOUNTER — Ambulatory Visit (INDEPENDENT_AMBULATORY_CARE_PROVIDER_SITE_OTHER): Payer: Medicare Other | Admitting: Podiatry

## 2016-10-10 VITALS — BP 164/81 | HR 85 | Ht 69.0 in | Wt 176.0 lb

## 2016-10-10 DIAGNOSIS — L309 Dermatitis, unspecified: Secondary | ICD-10-CM

## 2016-10-10 MED ORDER — AMMONIUM LACTATE 12 % EX CREA: TOPICAL_CREAM | CUTANEOUS | 0 refills | Status: DC | PRN

## 2016-10-10 NOTE — Progress Notes (Signed)
   Subjective:    Patient ID: Russell Austin, male    DOB: 1951/02/02, 66 y.o.   MRN: [id number]  HPI this patient presents to the office with chief complaint of dry scaly skin noted over his left ankle. Patient states that this dry scaly skin has been present for approximately 7-10 days. He denies any history of redness or drainage noted at the site and denies any itching at the site. He does give a history of having severe swelling in both ankles and feet, which has been treated with Lasix. He has provided no self treatment nor sought any professional help. Patient is diabetic and he is concerned about this skin problem. He presents the office today for an evaluation and treatment of this condition    Review of Systems  All other systems reviewed and are negative.      Objective:   Physical Exam GENERAL APPEARANCE: Alert, conversant. Appropriately groomed. No acute distress.  VASCULAR: Pedal pulses are  palpable at  [facility] and PT bilateral.  Capillary refill time is immediate to all digits,  Normal temperature gradient.   NEUROLOGIC: sensation is normal to 5.07 monofilament at 5/5 sites bilateral.  Light touch is intact bilateral, Muscle strength normal.  MUSCULOSKELETAL: acceptable muscle strength, tone and stability bilateral.  Intrinsic muscluature intact bilateral.  Rectus appearance of foot and digits noted bilateral.   DERMATOLOGIC: Dry scaly areas of skin on the medial and lateral aspects left ankle.  There is the beginning of dry scaly skin lateral aspect right ankle over sinus tarsi.  No redness or swelling or streaking noted feet  B/L No preulcerative lesions or ulcers  are seen, no interdigital maceration noted.  No open lesions present.  Digital nails are asymptomatic. No drainage noted. Dry fissuring developing on plantar left forefoot.         Assessment & Plan:  Dermatitis feet  B/L  ROV  Prescribed lac-hydrin.  Use vaseline on feet.  Told patient this is not serious  condition.   RTC 2 week for diabetic nail care and re-evaluation of his dermatitis will be performed at that time.   Gardiner Barefoot DPM

## 2016-10-18 ENCOUNTER — Other Ambulatory Visit: Payer: Self-pay | Admitting: Internal Medicine

## 2016-10-25 ENCOUNTER — Ambulatory Visit (INDEPENDENT_AMBULATORY_CARE_PROVIDER_SITE_OTHER): Payer: Medicare Other | Admitting: Podiatry

## 2016-10-25 ENCOUNTER — Encounter: Payer: Self-pay | Admitting: Podiatry

## 2016-10-25 VITALS — Ht 69.0 in | Wt 176.0 lb

## 2016-10-25 DIAGNOSIS — M79676 Pain in unspecified toe(s): Secondary | ICD-10-CM | POA: Diagnosis not present

## 2016-10-25 DIAGNOSIS — B351 Tinea unguium: Secondary | ICD-10-CM | POA: Diagnosis not present

## 2016-10-25 NOTE — Progress Notes (Signed)
Patient ID: Russell Austin, male   DOB: 10-14-1950, 66 y.o.   MRN: [id number] Complaint:  Visit Type: Patient returns to my office for continued preventative foot care services. Complaint: Patient states" my nails have grown long and thick and become painful to walk and wear shoes" Patient has been diagnosed with DM with neuropathy.. The patient presents for preventative foot care services. No changes to ROS.  His skin lesions have healed both feet. Left ankle has healed with no peeling present.  Podiatric Exam: Vascular: dorsalis pedis and posterior tibial pulses are palpable bilateral. Capillary return is immediate. Temperature gradient is WNL. Skin turgor WNL  Sensorium: Normal Semmes Weinstein monofilament test. Normal tactile sensation bilaterally. Nail Exam: Pt has thick disfigured discolored nails with subungual debris noted bilateral entire nail hallux through fifth toenails Ulcer Exam: There is no evidence of ulcer or pre-ulcerative changes or infection. Orthopedic Exam: Muscle tone and strength are WNL. No limitations in general ROM. No crepitus or effusions noted. Foot type and digits show no abnormalities. HAV 1st MPJ B/L.Marland Kitchen Skin: No Porokeratosis. No infection or ulcers.    Diagnosis:  Onychomycosis, , Pain in right toe, pain in left toes  Treatment & Plan Procedures and Treatment: Consent by patient was obtained for treatment procedures. The patient understood the discussion of treatment and procedures well. All questions were answered thoroughly reviewed. Debridement of mycotic and hypertrophic toenails, 1 through 5 bilateral and clearing of subungual debris. No ulceration, no infection noted. To consider diabetic shoes next visit. Return Visit-Office Procedure: Patient instructed to return to the office for a follow up visit prn  for continued evaluation and treatment.    Gardiner Barefoot DPM

## 2016-10-30 ENCOUNTER — Encounter: Payer: Self-pay | Admitting: Adult Health

## 2016-10-30 ENCOUNTER — Ambulatory Visit (INDEPENDENT_AMBULATORY_CARE_PROVIDER_SITE_OTHER): Payer: Medicare Other | Admitting: Adult Health

## 2016-10-30 VITALS — BP 172/80 | Temp 97.6°F | Ht 69.0 in | Wt 176.8 lb

## 2016-10-30 DIAGNOSIS — R600 Localized edema: Secondary | ICD-10-CM | POA: Diagnosis not present

## 2016-10-30 DIAGNOSIS — I10 Essential (primary) hypertension: Secondary | ICD-10-CM

## 2016-10-30 MED ORDER — METOPROLOL TARTRATE 50 MG PO TABS: 50.0000 mg | ORAL_TABLET | Freq: Two times a day (BID) | ORAL | 1 refills | Status: DC

## 2016-10-30 NOTE — Progress Notes (Signed)
Subjective:    Patient ID: Russell Austin, male    DOB: 1950/11/30, 66 y.o.   MRN: [id number]  HPI  66 year old male who presents to the office today for follow up regarding lower extremity edema and essential hypertension. During the last visit he was placed on lasix 20 mg every other day. At that time he was supposed to be taking Metoprolol 50 mg in the morning and 25 mg in the evening- he was not doing this.  BP Readings from Last 3 Encounters:  10/10/16 (!) 164/81  09/27/16 (!) 174/80  08/28/16 140/90   Today in the office he reports that his lower extremity edema has improved but he continues to be swollen. He denies elevating his legs at night.   His blood pressure is not at goal at this point in time. He reports " I only have been taking my medications in the morning"  He denies any chest pain, SOB, dizziness or lightheadedness.   Review of Systems  Constitutional: Negative.   HENT: Negative.   Respiratory: Negative.   Cardiovascular: Negative.   Genitourinary: Negative.   Musculoskeletal: Negative.   Neurological: Negative.   All other systems reviewed and are negative.  Past Medical History:  Diagnosis Date  . 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 Diabetes mellitus type II   . Diabetic retinopathy FOLLOWED BY DR Zadie Rhine  . ED (erectile dysfunction)   . Glaucoma of both eyes   . Hypertension   . Left hydrocele   . Stroke [facility])     Social History   Social History  . Marital status: Married    Spouse name: N/A  . Number of children: 1  . Years of education: 34   Occupational History  . Disabled    Social History Main Topics  . Smoking status: Never Smoker  . Smokeless tobacco: Never Used  . Alcohol use No  . Drug use: No  . Sexual activity: Not on file   Other Topics Concern  . Not on file   Social History Narrative   Lives at home with his wife and granddaughter.   Left-handed.   3 cups caffeine per day.       Past  Surgical History:  Procedure Laterality Date  . APPENDECTOMY  AGE EARLY 20'S  . HYDROCELE EXCISION  03/31/2012   Procedure: HYDROCELECTOMY ADULT;  Surgeon: Franchot Gallo, MD;  Location: [facility];  Service: Urology;  Laterality: Left;  45 MINS    . LEFT EYE LASER RETINA REPAIR  SEPT 2012  . RIGHT EYE VITRECTOMY/ INSERTION GLAUCOMA SETON/ LASER REPAIR  12-13-2008   RETINAL ARTERY OCCLUSION /NEOVASCULAR GLAUCOMA/ HEMORRHAGE  . RIGHT EYE VITRETOMY/ INSERTION GLAUCOMA SETON X2/ LASER  03-24-2009   RECURRENT HEMORRHAGE/ OCCLUSION INTERNAL SETON  . SHOULDER ARTHROSCOPY Right 2005  . undescended right testicle removed  1994    Family History  Problem Relation Age of Onset  . Glaucoma Mother   . Diabetes Mother   . Stomach cancer Maternal Grandfather   . Stroke Maternal Grandmother     Allergies  Allergen Reactions  . Lactose Intolerance (Gi) Diarrhea  . Apple Pectin [Pectin] Itching    ITCHY THROAT  . Peach [Prunus Persica] Itching    ITCHY THROAT  . Morphine And Related Anxiety    Jittery    Current Outpatient Prescriptions on File Prior to Visit  Medication Sig Dispense Refill  . amLODipine (NORVASC) 10 MG tablet Take 1 tablet (  10 mg total) by mouth daily. 90 tablet 1  . ammonium lactate (LAC-HYDRIN) 12 % cream Apply topically as needed for dry skin. 385 g 0  . aspirin 325 MG tablet Take 1 tablet (325 mg total) by mouth daily. 30 tablet 0  . atorvastatin (LIPITOR) 10 MG tablet TAKE 1 TABLET (10 MG TOTAL) BY MOUTH DAILY AT 6 PM. 90 tablet 1  . blood glucose meter kit and supplies Dispense based on Insurance. Test blood sugars once daily. Dx: E10.9 1 each 0  . furosemide (LASIX) 20 MG tablet Take 1 tablet (20 mg total) by mouth every other day. 15 tablet 0  . gabapentin (NEURONTIN) 300 MG capsule TAKE 1 CAPSULE (300 MG TOTAL) BY MOUTH AT BEDTIME. 90 capsule 1  . glucose blood (FREESTYLE LITE) test strip Test once daily dx E10.9 100 each 12  . Insulin Glargine  (LANTUS SOLOSTAR) 100 UNIT/ML Solostar Pen INJECT 23 UNITS SUBCUTANEOUSLY AT BEDTIME 15 pen 1  . losartan-hydrochlorothiazide (HYZAAR) 100-12.5 MG tablet TAKE 1 TABLET BY MOUTH DAILY. 90 tablet 1  . metFORMIN (GLUCOPHAGE) 1000 MG tablet TAKE 1 TABLET (1,000 MG TOTAL) BY MOUTH 2 (TWO) TIMES DAILY WITH A MEAL. 180 tablet 0  . tiZANidine (ZANAFLEX) 2 MG tablet Take 1 tablet twice a day and take 2 tablets at night 120 tablet 1   No current facility-administered medications on file prior to visit.     BP (!) 172/80   Temp 97.6 F (36.4 C) (Oral)   Ht _0  (1.753 m)   Wt 176 lb 12.8 oz (80.2 kg)   BMI 26.11 kg/m       Objective:   Physical Exam  Constitutional: He is oriented to person, place, and time. He appears well-developed and well-nourished. No distress.  Cardiovascular: Normal rate, regular rhythm, normal heart sounds and intact distal pulses.  Exam reveals no gallop and no friction rub.   No murmur heard. Pulmonary/Chest: Effort normal and breath sounds normal. No respiratory distress. He has no wheezes. He has no rales. He exhibits no tenderness.  Musculoskeletal: He exhibits edema (+ 2 pitting edema) and tenderness.  Neurological: He is alert and oriented to person, place, and time.  Skin: Skin is warm and dry. No rash noted. He is not diaphoretic. No erythema.  Psychiatric: He has a normal mood and affect. His behavior is normal. Judgment and thought content normal.  Nursing note and vitals reviewed.      Assessment & Plan:  1. Essential hypertension - Take medications as directed - metoprolol tartrate (LOPRESSOR) 50 MG tablet; Take 1 tablet (50 mg total) by mouth 2 (two) times daily.  Dispense: 180 tablet; Refill: 1 - Follow up in one month and bring cuff with you.  - Return precautions given   2. Lower extremity edema - Has improved.  - Raise legs above heart when resting - Can get compression socks    [name], NP

## 2016-10-30 NOTE — Patient Instructions (Addendum)
Elevate your legs at night and cut back on pork   I have increased Metoprolol from 25 mg daily ( as you were taking it) to 50 mg twice a day .   Please follow up with me in one month and bring your cuff with you.

## 2016-11-19 ENCOUNTER — Other Ambulatory Visit: Payer: Self-pay | Admitting: Adult Health

## 2016-11-19 DIAGNOSIS — R600 Localized edema: Secondary | ICD-10-CM

## 2016-11-20 NOTE — Telephone Encounter (Signed)
Rx was last refilled 09/27/16 for #15 - QOD. Ok to refill?

## 2016-11-20 NOTE — Telephone Encounter (Signed)
This was for a short duration. Can we see if it helped. If it did and we want to continue then I am going to have to come in for blood work in one week after restarting this medication

## 2016-11-20 NOTE — Telephone Encounter (Signed)
I left message for patient to return phone call.   

## 2016-11-22 NOTE — Telephone Encounter (Signed)
I left 2nd message for patient to return phone call.   

## 2016-11-23 NOTE — Telephone Encounter (Signed)
Please call pt back at [phone]

## 2016-11-23 NOTE — Telephone Encounter (Signed)
I left message for patient to return phone call.   

## 2016-11-27 NOTE — Telephone Encounter (Signed)
Patient has appt tomorrow morning at 7:00 with [facility]. Will discuss this refill then.

## 2016-11-28 ENCOUNTER — Ambulatory Visit (INDEPENDENT_AMBULATORY_CARE_PROVIDER_SITE_OTHER): Payer: Medicare Other | Admitting: Adult Health

## 2016-11-28 ENCOUNTER — Encounter: Payer: Self-pay | Admitting: Adult Health

## 2016-11-28 ENCOUNTER — Other Ambulatory Visit: Payer: Self-pay | Admitting: Adult Health

## 2016-11-28 VITALS — BP 142/74 | Temp 98.6°F | Ht 69.0 in | Wt 173.3 lb

## 2016-11-28 DIAGNOSIS — R600 Localized edema: Secondary | ICD-10-CM | POA: Diagnosis not present

## 2016-11-28 DIAGNOSIS — I10 Essential (primary) hypertension: Secondary | ICD-10-CM

## 2016-11-28 MED ORDER — FUROSEMIDE 20 MG PO TABS: 20.0000 mg | ORAL_TABLET | ORAL | 0 refills | Status: DC

## 2016-11-28 NOTE — Progress Notes (Signed)
Subjective:    Patient ID: Russell Austin, male    DOB: [birth year], 66 y.o.   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 He presents to the clinic today for one month follow up regarding hypertension. During the last visit Metoprolol was increased to 50 mg BID. Today in the office he reports that his blood pressures have been improving. They are running in the 140's at home.   He continues to have swelling in his lower extremities but feels as though this is improving as well. Has pain around his ankles from the edema. Not wearing compression socks as we discussed last time.   Review of Systems See HPI   Past Medical History:  Diagnosis Date  . 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 Diabetes mellitus type II   . Diabetic retinopathy FOLLOWED BY DR Zadie Rhine  . ED (erectile dysfunction)   . Glaucoma of both eyes   . Hypertension   . Left hydrocele   . Stroke [facility])     Social History   Social History  . Marital status: Married    Spouse name: N/A  . Number of children: 1  . Years of education: 76   Occupational History  . Disabled    Social History Main Topics  . Smoking status: Never Smoker  . Smokeless tobacco: Never Used  . Alcohol use No  . Drug use: No  . Sexual activity: Not on file   Other Topics Concern  . Not on file   Social History Narrative   Lives at home with his wife and granddaughter.   Left-handed.   3 cups caffeine per day.       Past Surgical History:  Procedure Laterality Date  . APPENDECTOMY  AGE EARLY 20'S  . HYDROCELE EXCISION  03/31/2012   Procedure: HYDROCELECTOMY ADULT;  Surgeon: Franchot Gallo, MD;  Location: [facility];  Service: Urology;   Laterality: Left;  45 MINS    . LEFT EYE LASER RETINA REPAIR  SEPT 2012  . RIGHT EYE VITRECTOMY/ INSERTION GLAUCOMA SETON/ LASER REPAIR  12-13-2008   RETINAL ARTERY OCCLUSION /NEOVASCULAR GLAUCOMA/ HEMORRHAGE  . RIGHT EYE VITRETOMY/ INSERTION GLAUCOMA SETON X2/ LASER  03-24-2009   RECURRENT HEMORRHAGE/ OCCLUSION INTERNAL SETON  . SHOULDER ARTHROSCOPY Right 2005  . undescended right testicle removed  1994    Family History  Problem Relation Age of Onset  . Glaucoma Mother   . Diabetes Mother   . Stomach cancer Maternal Grandfather   . Stroke Maternal Grandmother     Allergies  Allergen Reactions  . Lactose Intolerance (Gi) Diarrhea  . Apple Pectin [Pectin] Itching    ITCHY THROAT  . Peach [Prunus Persica] Itching    ITCHY THROAT  . Morphine And Related Anxiety    Jittery    Current Outpatient Prescriptions on File Prior to Visit  Medication Sig Dispense Refill  . amLODipine (NORVASC) 10 MG tablet Take 1 tablet (10 mg total) by mouth daily. 90 tablet 1  . ammonium lactate (LAC-HYDRIN) 12 % cream Apply topically as needed for dry skin. 385 g 0  . aspirin 325 MG tablet Take 1 tablet (325 mg total) by mouth daily. 30 tablet 0  . atorvastatin (LIPITOR) 10 MG  tablet TAKE 1 TABLET (10 MG TOTAL) BY MOUTH DAILY AT 6 PM. 90 tablet 1  . blood glucose meter kit and supplies Dispense based on Insurance. Test blood sugars once daily. Dx: E10.9 1 each 0  . gabapentin (NEURONTIN) 300 MG capsule TAKE 1 CAPSULE (300 MG TOTAL) BY MOUTH AT BEDTIME. 90 capsule 1  . glucose blood (FREESTYLE LITE) test strip Test once daily dx E10.9 100 each 12  . Insulin Glargine (LANTUS SOLOSTAR) 100 UNIT/ML Solostar Pen INJECT 23 UNITS SUBCUTANEOUSLY AT BEDTIME 15 pen 1  . losartan-hydrochlorothiazide (HYZAAR) 100-12.5 MG tablet TAKE 1 TABLET BY MOUTH DAILY. 90 tablet 1  . metFORMIN (GLUCOPHAGE) 1000 MG tablet TAKE 1 TABLET (1,000 MG TOTAL) BY MOUTH 2 (TWO) TIMES DAILY WITH A MEAL. 180 tablet 0  . metoprolol  tartrate (LOPRESSOR) 50 MG tablet Take 1 tablet (50 mg total) by mouth 2 (two) times daily. 180 tablet 1  . tiZANidine (ZANAFLEX) 2 MG tablet Take 1 tablet twice a day and take 2 tablets at night 120 tablet 1   No current facility-administered medications on file prior to visit.     BP (!) 142/74 (BP Location: Left Arm, Patient Position: Sitting, Cuff Size: Normal)   Temp 98.6 F (37 C) (Oral)   Ht 5' 9" (1.753 m)   Wt 173 lb 4.8 oz (78.6 kg)   BMI 25.59 kg/m       Objective:   Physical Exam  Constitutional: He is oriented to person, place, and time. He appears well-developed and well-nourished. No distress.  HENT:  Head: Normocephalic and atraumatic.  Right Ear: External ear normal.  Left Ear: External ear normal.  Nose: Nose normal.  Mouth/Throat: Oropharynx is clear and moist. No oropharyngeal exudate.  Eyes: Conjunctivae and EOM are normal. Pupils are equal, round, and reactive to light. Right eye exhibits no discharge. Left eye exhibits no discharge. No scleral icterus.  Cardiovascular: Normal rate, regular rhythm, normal heart sounds and intact distal pulses.  Exam reveals no gallop and no friction rub.   No murmur heard. Pulmonary/Chest: Effort normal and breath sounds normal. No respiratory distress. He has no wheezes. He has no rales. He exhibits no tenderness.  Musculoskeletal: He exhibits edema (+ 1 pitting edema from ankles to shin in bilateral legs).  Neurological: He is alert and oriented to person, place, and time.  Skin: Skin is warm and dry. No rash noted. He is not diaphoretic. No erythema. No pallor.  Psychiatric: He has a normal mood and affect. His behavior is normal. Judgment and thought content normal.  Nursing note and vitals reviewed.     Assessment & Plan:  1. Essential hypertension - Continue with Metoprolol 50 mg BID  - EKG 12-Lead- NSR, Rate 86   2. Lower extremity edema - furosemide (LASIX) 20 MG tablet; Take 1 tablet (20 mg total) by mouth  every other day.  Dispense: 15 tablet; Refill: 0 - Wear compression socks  - Follow up as needed   , NP  

## 2016-11-28 NOTE — Patient Instructions (Signed)
It was great seeing you today!  I have sent in a prescription for Lasix- take this every other day until finished.   Please wear compression socks to help with your swelling  Elevate legs above heart when resting   Follow up as needed

## 2016-12-04 ENCOUNTER — Telehealth: Payer: Self-pay | Admitting: Adult Health

## 2016-12-04 ENCOUNTER — Other Ambulatory Visit: Payer: Self-pay

## 2016-12-04 MED ORDER — INSULIN PEN NEEDLE 31G X 8 MM MISC: 3 refills | Status: DC

## 2016-12-04 MED ORDER — GLUCOSE BLOOD VI STRP: ORAL_STRIP | 12 refills | Status: DC

## 2016-12-04 NOTE — Telephone Encounter (Signed)
Pt need new Rx for Freestyle lancets and test strips   Pharm:  [facility] [address]    Pt is unable to test due to him being out.

## 2016-12-04 NOTE — Telephone Encounter (Signed)
I have sent in new rx for lancets and test strips to preferred pharmacy. Thanks!

## 2016-12-25 ENCOUNTER — Other Ambulatory Visit: Payer: Self-pay | Admitting: Adult Health

## 2016-12-25 DIAGNOSIS — R600 Localized edema: Secondary | ICD-10-CM

## 2016-12-25 NOTE — Telephone Encounter (Signed)
Ok to refill for 90 +1 

## 2017-01-02 DIAGNOSIS — H3582 Retinal ischemia: Secondary | ICD-10-CM | POA: Diagnosis not present

## 2017-01-02 DIAGNOSIS — E113312 Type 2 diabetes mellitus with moderate nonproliferative diabetic retinopathy with macular edema, left eye: Secondary | ICD-10-CM | POA: Diagnosis not present

## 2017-01-19 ENCOUNTER — Other Ambulatory Visit: Payer: Self-pay | Admitting: Adult Health

## 2017-01-19 DIAGNOSIS — R600 Localized edema: Secondary | ICD-10-CM

## 2017-01-22 DIAGNOSIS — H40012 Open angle with borderline findings, low risk, left eye: Secondary | ICD-10-CM | POA: Diagnosis not present

## 2017-01-22 DIAGNOSIS — H4051X3 Glaucoma secondary to other eye disorders, right eye, severe stage: Secondary | ICD-10-CM | POA: Diagnosis not present

## 2017-01-22 DIAGNOSIS — H44521 Atrophy of globe, right eye: Secondary | ICD-10-CM | POA: Diagnosis not present

## 2017-01-23 ENCOUNTER — Ambulatory Visit: Payer: Medicare Other | Admitting: Podiatry

## 2017-02-14 ENCOUNTER — Encounter: Payer: Self-pay | Admitting: Family Medicine

## 2017-02-18 ENCOUNTER — Other Ambulatory Visit: Payer: Self-pay | Admitting: Internal Medicine

## 2017-03-24 ENCOUNTER — Other Ambulatory Visit: Payer: Self-pay | Admitting: Adult Health

## 2017-03-26 NOTE — Telephone Encounter (Signed)
Filled for 90 days.  Pt due for yearly 06/2017.  Currently not scheduled.

## 2017-03-27 ENCOUNTER — Encounter: Payer: Self-pay | Admitting: Adult Health

## 2017-03-27 ENCOUNTER — Ambulatory Visit (INDEPENDENT_AMBULATORY_CARE_PROVIDER_SITE_OTHER): Payer: Medicare Other | Admitting: Adult Health

## 2017-03-27 VITALS — BP 152/78 | HR 80 | Temp 98.0°F | Ht 69.0 in | Wt 172.0 lb

## 2017-03-27 DIAGNOSIS — E538 Deficiency of other specified B group vitamins: Secondary | ICD-10-CM | POA: Diagnosis not present

## 2017-03-27 DIAGNOSIS — Z794 Long term (current) use of insulin: Secondary | ICD-10-CM

## 2017-03-27 DIAGNOSIS — D649 Anemia, unspecified: Secondary | ICD-10-CM

## 2017-03-27 DIAGNOSIS — E1140 Type 2 diabetes mellitus with diabetic neuropathy, unspecified: Secondary | ICD-10-CM | POA: Diagnosis not present

## 2017-03-27 LAB — CBC WITH DIFFERENTIAL/PLATELET
BASOS ABS: 0 10*3/uL (ref 0.0–0.1)
BASOS PCT: 0.8 % (ref 0.0–3.0)
EOS PCT: 4.2 % (ref 0.0–5.0)
Eosinophils Absolute: 0.2 10*3/uL (ref 0.0–0.7)
HEMATOCRIT: 29.7 % — AB (ref 39.0–52.0)
Hemoglobin: 10.1 g/dL — ABNORMAL LOW (ref 13.0–17.0)
LYMPHS PCT: 37.7 % (ref 12.0–46.0)
Lymphs Abs: 1.6 10*3/uL (ref 0.7–4.0)
MCHC: 34.1 g/dL (ref 30.0–36.0)
MCV: 89.3 fl (ref 78.0–100.0)
MONOS PCT: 7.7 % (ref 3.0–12.0)
Monocytes Absolute: 0.3 10*3/uL (ref 0.1–1.0)
NEUTROS ABS: 2.1 10*3/uL (ref 1.4–7.7)
Neutrophils Relative %: 49.6 % (ref 43.0–77.0)
PLATELETS: 289 10*3/uL (ref 150.0–400.0)
RBC: 3.32 Mil/uL — ABNORMAL LOW (ref 4.22–5.81)
RDW: 13.1 % (ref 11.5–15.5)
WBC: 4.2 10*3/uL (ref 4.0–10.5)

## 2017-03-27 LAB — VITAMIN B12: Vitamin B-12: 495 pg/mL (ref 211–911)

## 2017-03-27 LAB — IBC PANEL
Iron: 70 ug/dL (ref 42–165)
SATURATION RATIOS: 23.3 % (ref 20.0–50.0)
Transferrin: 215 mg/dL (ref 212.0–360.0)

## 2017-03-27 LAB — FOLATE: Folate: 19.2 ng/mL (ref >5.9)

## 2017-03-27 LAB — TSH: TSH: 2.35 u[IU]/mL (ref 0.35–4.50)

## 2017-03-27 LAB — HEMOGLOBIN A1C: HEMOGLOBIN A1C: 7.8 % — AB (ref 4.6–6.5)

## 2017-03-27 NOTE — Progress Notes (Addendum)
Subjective:    Patient ID: Russell Austin, male    DOB: 03-13-1951, 66 y.o.   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 He presents to the office today for possible low H&H. He reports that he was at a blood drive at church and his hemoglobin was 8.9. It was checked again in his right hand and it was 10.9. He would like to give blood but he cannot until his hemoglobin is 13.   He has been anemic in the past, but appears to be Normocytic anemia  His only complaint is that " feeling cold all the time"   CBC Latest Ref Rng & Units 06/19/2016 02/15/2015 02/11/2015  WBC 4.0 - 10.5 K/uL 4.5 5.5 5.4  Hemoglobin 13.0 - 17.0 g/dL 10.9(L) 11.9(L) 11.9(L)  Hematocrit 39.0 - 52.0 % 32.5(L) 36.2(L) 34.8(L)  Platelets 150.0 - 400.0 K/uL 285.0 273 253     Review of Systems See HPI   Past Medical History:  Diagnosis Date  . 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 Diabetes mellitus type II   . Diabetic retinopathy FOLLOWED BY DR Zadie Rhine  . ED (erectile dysfunction)   . Glaucoma of both eyes   . Hypertension   . Left hydrocele   . Stroke [facility])     Social History   Social History  . Marital status: Married    Spouse name: N/A  . Number of children: 1  . Years of education: 70   Occupational History  . Disabled    Social History Main Topics  . Smoking status: Never Smoker  . Smokeless tobacco: Never Used  . Alcohol use No  . Drug use: No  . Sexual activity: Not on file   Other Topics Concern  . Not on file   Social History Narrative   Lives at home with his wife and granddaughter.   Left-handed.   3 cups caffeine per day.       Past Surgical History:  Procedure Laterality Date  . APPENDECTOMY   AGE EARLY 20'S  . HYDROCELE EXCISION  03/31/2012   Procedure: HYDROCELECTOMY ADULT;  Surgeon: Franchot Gallo, MD;  Location: [facility];  Service: Urology;  Laterality: Left;  45 MINS    . LEFT EYE LASER RETINA REPAIR  SEPT 2012  . RIGHT EYE VITRECTOMY/ INSERTION GLAUCOMA SETON/ LASER REPAIR  12-13-2008   RETINAL ARTERY OCCLUSION /NEOVASCULAR GLAUCOMA/ HEMORRHAGE  . RIGHT EYE VITRETOMY/ INSERTION GLAUCOMA SETON X2/ LASER  03-24-2009   RECURRENT HEMORRHAGE/ OCCLUSION INTERNAL SETON  . SHOULDER ARTHROSCOPY Right 2005  . undescended right testicle removed  1994    Family History  Problem Relation Age of Onset  . Glaucoma Mother   . Diabetes Mother   . Stomach cancer Maternal Grandfather   . Stroke Maternal Grandmother     Allergies  Allergen Reactions  . Lactose Intolerance (Gi) Diarrhea  . Apple Pectin [Pectin] Itching    ITCHY THROAT  . Peach [Prunus Persica] Itching    ITCHY THROAT  . Morphine And Related Anxiety    Jittery    Current Outpatient Prescriptions on File Prior to Visit  Medication Sig Dispense Refill  . amLODipine (NORVASC) 10 MG tablet Take 1 tablet (10 mg total) by mouth daily.  90 tablet 1  . ammonium lactate (LAC-HYDRIN) 12 % cream Apply topically as needed for dry skin. 385 g 0  . aspirin 325 MG tablet Take 1 tablet (325 mg total) by mouth daily. 30 tablet 0  . atorvastatin (LIPITOR) 10 MG tablet TAKE 1 TABLET (10 MG TOTAL) BY MOUTH DAILY AT 6 PM. 90 tablet 1  . blood glucose meter kit and supplies Dispense based on Insurance. Test blood sugars once daily. Dx: E10.9 1 each 0  . furosemide (LASIX) 20 MG tablet TAKE 1 TABLET (20 MG TOTAL) BY MOUTH EVERY OTHER DAY. 90 tablet 1  . gabapentin (NEURONTIN) 300 MG capsule TAKE 1 CAPSULE (300 MG TOTAL) BY MOUTH AT BEDTIME. 90 capsule 0  . glucose blood (FREESTYLE LITE) test strip Test once daily dx E10.9 100 each 12  . Insulin Glargine (LANTUS SOLOSTAR) 100 UNIT/ML Solostar Pen INJECT 23 UNITS  SUBCUTANEOUSLY AT BEDTIME 15 pen 1  . Insulin Pen Needle (B-D ULTRAFINE III SHORT PEN) 31G X 8 MM MISC USE WITH LANUTS PEN 100 each 3  . losartan-hydrochlorothiazide (HYZAAR) 100-12.5 MG tablet TAKE 1 TABLET BY MOUTH DAILY. 90 tablet 1  . metFORMIN (GLUCOPHAGE) 1000 MG tablet TAKE 1 TABLET (1,000 MG TOTAL) BY MOUTH 2 (TWO) TIMES DAILY WITH A MEAL. 180 tablet 0  . metoprolol tartrate (LOPRESSOR) 50 MG tablet Take 1 tablet (50 mg total) by mouth 2 (two) times daily. 180 tablet 1   No current facility-administered medications on file prior to visit.     BP (!) 152/78 (BP Location: Left Arm, Patient Position: Sitting, Cuff Size: Normal)   Pulse 80   Temp 98 F (36.7 C) (Oral)   Ht _0  (1.753 m)   Wt 172 lb (78 kg)   SpO2 98%   BMI 25.40 kg/m       Objective:   Physical Exam  Constitutional: He is oriented to person, place, and time. He appears well-developed and well-nourished. No distress.  HENT:  Mouth/Throat: Uvula is midline, oropharynx is clear and moist and mucous membranes are normal.  Cardiovascular: Normal rate, regular rhythm, normal heart sounds and intact distal pulses.  Exam reveals no gallop and no friction rub.   No murmur heard. Pulmonary/Chest: Effort normal and breath sounds normal. No respiratory distress. He has no wheezes. He has no rales. He exhibits no tenderness.  Genitourinary: Rectum normal. Rectal exam shows guaiac negative stool.  Neurological: He is alert and oriented to person, place, and time.  Skin: Skin is warm and dry. He is not diaphoretic.  Nursing note and vitals reviewed.     Assessment & Plan:  1. Anemia, unspecified type - Appears to be anemia of chronic disease possibly early iron deficiency anemia.  - CBC with Differential/Platelet - IBC Panel - Vitamin B12 - Folate - TSH  2. Type 2 diabetes mellitus with diabetic neuropathy, with long-term current use of insulin (HCC)  - Hemoglobin A1c  3. Vitamin B 12 deficiency  - Vitamin  B12 - Folate  [name], NP

## 2017-03-27 NOTE — Patient Instructions (Addendum)
It was great seeing you today!   I will follow up on your blood work. We may need to supplement with an over the counter iron supplement.   Anemia, Nonspecific Anemia is a condition in which the concentration of red blood cells or hemoglobin in the blood is below normal. Hemoglobin is a substance in red blood cells that carries oxygen to the tissues of the body. Anemia results in not enough oxygen reaching these tissues. What are the causes? Common causes of anemia include:  Excessive bleeding. Bleeding may be internal or external. This includes excessive bleeding from periods (in women) or from the intestine.  Poor nutrition.  Chronic kidney, thyroid, and liver disease.  Bone marrow disorders that decrease red blood cell production.  Cancer and treatments for cancer.  HIV, AIDS, and their treatments.  Spleen problems that increase red blood cell destruction.  Blood disorders.  Excess destruction of red blood cells due to infection, medicines, and autoimmune disorders.  What are the signs or symptoms?  Minor weakness.  Dizziness.  Headache.  Palpitations.  Shortness of breath, especially with exercise.  Paleness.  Cold sensitivity.  Indigestion.  Nausea.  Difficulty sleeping.  Difficulty concentrating. Symptoms may occur suddenly or they may develop slowly. How is this diagnosed? Additional blood tests are often needed. These help your health care provider determine the best treatment. Your health care provider will check your stool for blood and look for other causes of blood loss. How is this treated? Treatment varies depending on the cause of the anemia. Treatment can include:  Supplements of iron, vitamin O97, or folic acid.  Hormone medicines.  A blood transfusion. This may be needed if blood loss is severe.  Hospitalization. This may be needed if there is significant continual blood loss.  Dietary changes.  Spleen removal.  Follow these  instructions at home: Keep all follow-up appointments. It often takes many weeks to correct anemia, and having your health care provider check on your condition and your response to treatment is very important. Get help right away if:  You develop extreme weakness, shortness of breath, or chest pain.  You become dizzy or have trouble concentrating.  You develop heavy vaginal bleeding.  You develop a rash.  You have bloody or black, tarry stools.  You faint.  You vomit up blood.  You vomit repeatedly.  You have abdominal pain.  You have a fever or persistent symptoms for more than 2-3 days.  You have a fever and your symptoms suddenly get worse.  You are dehydrated. This information is not intended to replace advice given to you by your health care provider. Make sure you discuss any questions you have with your health care provider. Document Released: 09/27/2004 Document Revised: 02/01/2016 Document Reviewed: 02/13/2013 Elsevier Interactive Patient Education  2017 [employer].

## 2017-03-28 ENCOUNTER — Telehealth: Payer: Self-pay | Admitting: Adult Health

## 2017-03-28 NOTE — Telephone Encounter (Signed)
Pt would like results of labs. Please call back. °

## 2017-03-28 NOTE — Telephone Encounter (Signed)
Spoke to the pt and informed him of lab results.  Instructed him to increase lantus by 4 units.  Total should have been 27 units.  Pt informed me that he has only been taking 10 units  Advised that he take 14 units until he hears otherwise.  Will forward to [facility] for additional instruction.

## 2017-04-01 NOTE — Telephone Encounter (Signed)
This is fine 

## 2017-05-05 ENCOUNTER — Other Ambulatory Visit: Payer: Self-pay | Admitting: Internal Medicine

## 2017-05-16 ENCOUNTER — Other Ambulatory Visit: Payer: Self-pay | Admitting: Internal Medicine

## 2017-05-29 ENCOUNTER — Ambulatory Visit (INDEPENDENT_AMBULATORY_CARE_PROVIDER_SITE_OTHER): Payer: Medicare Other | Admitting: Adult Health

## 2017-05-29 ENCOUNTER — Encounter: Payer: Self-pay | Admitting: Adult Health

## 2017-05-29 ENCOUNTER — Telehealth: Payer: Self-pay | Admitting: Adult Health

## 2017-05-29 VITALS — BP 146/76 | Temp 97.5°F | Ht 69.0 in | Wt 174.0 lb

## 2017-05-29 DIAGNOSIS — M545 Low back pain, unspecified: Secondary | ICD-10-CM

## 2017-05-29 DIAGNOSIS — Z23 Encounter for immunization: Secondary | ICD-10-CM | POA: Diagnosis not present

## 2017-05-29 MED ORDER — TIZANIDINE HCL 2 MG PO TABS: 2.0000 mg | ORAL_TABLET | Freq: Every day | ORAL | 0 refills | Status: DC

## 2017-05-29 NOTE — Telephone Encounter (Signed)
Pt is aware of below message. 

## 2017-05-29 NOTE — Addendum Note (Signed)
Addended by: Miles Costain T on: 05/29/2017 09:31 AM   Modules accepted: Orders

## 2017-05-29 NOTE — Telephone Encounter (Signed)
Called and spoke to pharmacy.  Pt has refills on file.  Due to be filled again on 06/19/17.  Pt needs to be notified.

## 2017-05-29 NOTE — Progress Notes (Signed)
 Subjective:    Patient ID: Russell Austin, male    DOB: 11/30/1950, 66 y.o.   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   He presets to the office today for an acute complaint of right sided low back pain x 2 weeks. He endorses a possible cause of the injury was picking up heavy objects at home. Pain is worse with movement ( bending and twisting) and change of positions. The pain is described as " sharp" and " pulling".   He has been using icy hot on a daily basis which he reports helps for a short time.   He denies any issues with bowel or bladder   Review of Systems See HPI   Past Medical History:  Diagnosis Date  . 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 Diabetes mellitus type II   . Diabetic retinopathy FOLLOWED BY DR RANKIN  . ED (erectile dysfunction)   . Glaucoma of both eyes   . Hypertension   . Left hydrocele   . Stroke (HCC)     Social History   Social History  . Marital status: Married    Spouse name: N/A  . Number of children: 1  . Years of education: 12   Occupational History  . Disabled    Social History Main Topics  . Smoking status: Never Smoker  . Smokeless tobacco: Never Used  . Alcohol use No  . Drug use: No  . Sexual activity: Not on file   Other Topics Concern  . Not on file   Social History Narrative   Lives at home with his wife and granddaughter.   Left-handed.   3 cups caffeine per day.       Past Surgical History:  Procedure Laterality Date  . APPENDECTOMY  AGE EARLY 20'S  . HYDROCELE EXCISION  03/31/2012   Procedure: HYDROCELECTOMY ADULT;  Surgeon: Stephen Dahlstedt, MD;  Location: [facility];  Service: Urology;  Laterality: Left;   45 MINS    . LEFT EYE LASER RETINA REPAIR  SEPT 2012  . RIGHT EYE VITRECTOMY/ INSERTION GLAUCOMA SETON/ LASER REPAIR  12-13-2008   RETINAL ARTERY OCCLUSION /NEOVASCULAR GLAUCOMA/ HEMORRHAGE  . RIGHT EYE VITRETOMY/ INSERTION GLAUCOMA SETON X2/ LASER  03-24-2009   RECURRENT HEMORRHAGE/ OCCLUSION INTERNAL SETON  . SHOULDER ARTHROSCOPY Right 2005  . undescended right testicle removed  1994    Family History  Problem Relation Age of Onset  . Glaucoma Mother   . Diabetes Mother   . Stomach cancer Maternal Grandfather   . Stroke Maternal Grandmother     Allergies  Allergen Reactions  . Lactose Intolerance (Gi) Diarrhea  . Apple Pectin [Pectin] Itching    ITCHY THROAT  . Peach [Prunus Persica] Itching    ITCHY THROAT  . Morphine And Related Anxiety    Jittery    Current Outpatient Prescriptions on File Prior to Visit  Medication Sig Dispense Refill  . amLODipine (NORVASC) 10 MG tablet Take 1 tablet (10 mg total) by mouth daily. 90 tablet 1  . aspirin 325 MG tablet Take 1 tablet (325 mg total) by mouth daily. 30 tablet 0  . atorvastatin (LIPITOR) 10 MG tablet TAKE 1 TABLET (10 MG TOTAL) BY MOUTH DAILY AT   6 PM. 90 tablet 1  . blood glucose meter kit and supplies Dispense based on Insurance. Test blood sugars once daily. Dx: E10.9 1 each 0  . furosemide (LASIX) 20 MG tablet TAKE 1 TABLET (20 MG TOTAL) BY MOUTH EVERY OTHER DAY. 90 tablet 1  . gabapentin (NEURONTIN) 300 MG capsule TAKE 1 CAPSULE (300 MG TOTAL) BY MOUTH AT BEDTIME. 90 capsule 0  . glucose blood (FREESTYLE LITE) test strip Test once daily dx E10.9 100 each 12  . Insulin Glargine (LANTUS SOLOSTAR) 100 UNIT/ML Solostar Pen INJECT 23 UNITS SUBCUTANEOUSLY AT BEDTIME 15 pen 1  . Insulin Pen Needle (B-D ULTRAFINE III SHORT PEN) 31G X 8 MM MISC USE WITH LANUTS PEN 100 each 3  . losartan-hydrochlorothiazide (HYZAAR) 100-12.5 MG tablet TAKE 1 TABLET BY MOUTH DAILY. 90 tablet 1  . metFORMIN (GLUCOPHAGE) 1000 MG tablet TAKE 1 TABLET  (1,000 MG TOTAL) BY MOUTH 2 (TWO) TIMES DAILY WITH A MEAL. 180 tablet 0  . metoprolol tartrate (LOPRESSOR) 50 MG tablet Take 1 tablet (50 mg total) by mouth 2 (two) times daily. 180 tablet 1   No current facility-administered medications on file prior to visit.     BP (!) 146/76 (BP Location: Left Arm)   Temp (!) 97.5 F (36.4 C) (Oral)   Ht 5' 9" (1.753 m)   Wt 174 lb (78.9 kg)   BMI 25.70 kg/m       Objective:   Physical Exam  Constitutional: He is oriented to person, place, and time. He appears well-developed and well-nourished. No distress.  Cardiovascular: Normal rate, regular rhythm, normal heart sounds and intact distal pulses.  Exam reveals no gallop and no friction rub.   No murmur heard. Pulmonary/Chest: Effort normal and breath sounds normal. No respiratory distress. He has no wheezes. He has no rales. He exhibits no tenderness.  Musculoskeletal: Normal range of motion. He exhibits tenderness. He exhibits no edema or deformity.  No spinal tenderness  Has pain to right lower back with palpation along external oblique   Neurological: He is alert and oriented to person, place, and time.  Skin: Skin is warm and dry. No rash noted. He is not diaphoretic. No erythema. No pallor.  Psychiatric: He has a normal mood and affect. His behavior is normal. Judgment and thought content normal.  Nursing note and vitals reviewed.     Assessment & Plan:  1. Acute right-sided low back pain without sciatica - appears to be muscle strain. Will prescribe low dose Zanaflex to take at night.  - Can use heating pad.  - Stretching exercises encouraged  - tiZANidine (ZANAFLEX) 2 MG tablet; Take 1 tablet (2 mg total) by mouth at bedtime.  Dispense: 30 tablet; Refill: 0   , NP   

## 2017-05-29 NOTE — Telephone Encounter (Signed)
Pt needs a refill on losartan-hctz #100 [facility] [address]

## 2017-06-01 ENCOUNTER — Other Ambulatory Visit: Payer: Self-pay | Admitting: Adult Health

## 2017-06-01 DIAGNOSIS — I10 Essential (primary) hypertension: Secondary | ICD-10-CM

## 2017-06-04 NOTE — Telephone Encounter (Signed)
Sent to the pharmacy by e-scribe for 90 days.  Pt scheduled for yearly/cpx on 06/19/17 @ 7 AM.

## 2017-06-19 ENCOUNTER — Ambulatory Visit: Payer: Medicare Other | Admitting: Adult Health

## 2017-06-25 ENCOUNTER — Other Ambulatory Visit: Payer: Self-pay | Admitting: Family Medicine

## 2017-06-25 ENCOUNTER — Ambulatory Visit: Payer: Medicare Other | Admitting: Adult Health

## 2017-06-25 DIAGNOSIS — M545 Low back pain, unspecified: Secondary | ICD-10-CM

## 2017-06-26 MED ORDER — TIZANIDINE HCL 2 MG PO TABS: 2.0000 mg | ORAL_TABLET | Freq: Every day | ORAL | 0 refills | Status: DC

## 2017-06-26 NOTE — Telephone Encounter (Signed)
Sent to the pharmacy by e-scribe. 

## 2017-06-26 NOTE — Telephone Encounter (Signed)
Ok to refill for 90 days  

## 2017-07-03 DIAGNOSIS — H3582 Retinal ischemia: Secondary | ICD-10-CM | POA: Diagnosis not present

## 2017-07-03 DIAGNOSIS — E113392 Type 2 diabetes mellitus with moderate nonproliferative diabetic retinopathy without macular edema, left eye: Secondary | ICD-10-CM | POA: Diagnosis not present

## 2017-07-03 DIAGNOSIS — H31012 Macula scars of posterior pole (postinflammatory) (post-traumatic), left eye: Secondary | ICD-10-CM | POA: Diagnosis not present

## 2017-07-04 ENCOUNTER — Other Ambulatory Visit: Payer: Self-pay | Admitting: Adult Health

## 2017-07-04 NOTE — Telephone Encounter (Signed)
Left a message on home and cell for a return call. 

## 2017-07-05 ENCOUNTER — Other Ambulatory Visit: Payer: Self-pay | Admitting: Adult Health

## 2017-07-05 NOTE — Telephone Encounter (Signed)
Sent to the pharmacy by e-scribe. 

## 2017-07-10 ENCOUNTER — Encounter: Payer: Self-pay | Admitting: Adult Health

## 2017-07-10 ENCOUNTER — Ambulatory Visit (INDEPENDENT_AMBULATORY_CARE_PROVIDER_SITE_OTHER): Payer: Medicare Other | Admitting: Adult Health

## 2017-07-10 VITALS — BP 166/80 | Temp 97.6°F | Ht 69.0 in | Wt 181.0 lb

## 2017-07-10 DIAGNOSIS — Z794 Long term (current) use of insulin: Secondary | ICD-10-CM

## 2017-07-10 DIAGNOSIS — I10 Essential (primary) hypertension: Secondary | ICD-10-CM | POA: Diagnosis not present

## 2017-07-10 DIAGNOSIS — E1140 Type 2 diabetes mellitus with diabetic neuropathy, unspecified: Secondary | ICD-10-CM | POA: Diagnosis not present

## 2017-07-10 DIAGNOSIS — R600 Localized edema: Secondary | ICD-10-CM

## 2017-07-10 LAB — POCT GLYCOSYLATED HEMOGLOBIN (HGB A1C): Hemoglobin A1C: 7.5

## 2017-07-10 MED ORDER — FUROSEMIDE 20 MG PO TABS
20.0000 mg | ORAL_TABLET | ORAL | 1 refills | Status: DC
Start: 2017-07-10 — End: 2018-01-17

## 2017-07-10 MED ORDER — METOPROLOL TARTRATE 50 MG PO TABS: 50.0000 mg | ORAL_TABLET | Freq: Two times a day (BID) | ORAL | 0 refills | Status: DC

## 2017-07-10 MED ORDER — METFORMIN HCL 1000 MG PO TABS: 1000.0000 mg | ORAL_TABLET | Freq: Two times a day (BID) | ORAL | 0 refills | Status: DC

## 2017-07-10 NOTE — Progress Notes (Signed)
Subjective:    Patient ID: Russell Austin, male    DOB: 03/11/1951, 66 y.o.   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 He presents to the office today for follow up regarding diabetes mellitus. His last A1c in July 2018 was 7.8. His current medication regimen is that of Metformin 1000 mg BID and Lantus 23 units at bedtime.    He reports that his blood sugar at home have been in the low to mid 200's. His wife reports that he is not using his lantus all the time and his diet continued to be poor.   His blood pressure is elevated today - he reports that he did not take his blood pressure medication this morning. He needs refill of metoprolol and Lasix    Review of Systems See HPI   Past Medical History:  Diagnosis Date  . 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 Diabetes mellitus type II   . Diabetic retinopathy FOLLOWED BY DR Zadie Rhine  . ED (erectile dysfunction)   . Glaucoma of both eyes   . Hypertension   . Left hydrocele   . Stroke [facility])     Social History   Socioeconomic History  . Marital status: Married    Spouse name: Not on file  . Number of children: 1  . Years of education: 34  . Highest education level: Not on file  Social Needs  . Financial resource strain: Not on file  . Food insecurity - worry: Not on file  . Food insecurity - inability: Not on file  . Transportation needs - medical: Not on file  . Transportation needs - non-medical: Not on file  Occupational History  . Occupation: Disabled  Tobacco Use  . Smoking status: Never Smoker  . Smokeless tobacco: Never Used  Substance and Sexual Activity  . Alcohol use: No  . Drug use: No  . Sexual activity: Not on file  Other  Topics Concern  . Not on file  Social History Narrative   Lives at home with his wife and granddaughter.   Left-handed.   3 cups caffeine per day.    Past Surgical History:  Procedure Laterality Date  . APPENDECTOMY  AGE EARLY 20'S  . LEFT EYE LASER RETINA REPAIR  SEPT 2012  . RIGHT EYE VITRECTOMY/ INSERTION GLAUCOMA SETON/ LASER REPAIR  12-13-2008   RETINAL ARTERY OCCLUSION /NEOVASCULAR GLAUCOMA/ HEMORRHAGE  . RIGHT EYE VITRETOMY/ INSERTION GLAUCOMA SETON X2/ LASER  03-24-2009   RECURRENT HEMORRHAGE/ OCCLUSION INTERNAL SETON  . SHOULDER ARTHROSCOPY Right 2005  . undescended right testicle removed  1994    Family History  Problem Relation Age of Onset  . Glaucoma Mother   . Diabetes Mother   . Stomach cancer Maternal Grandfather   . Stroke Maternal Grandmother     Allergies  Allergen Reactions  . Lactose Intolerance (Gi) Diarrhea  . Apple Pectin [Pectin] Itching    ITCHY THROAT  . Peach [Prunus Persica] Itching    ITCHY THROAT  . Morphine And Related Anxiety    Jittery    Current Outpatient Medications on File Prior to Visit  Medication Sig Dispense Refill  . amLODipine (NORVASC) 10 MG tablet Take 1 tablet (10 mg total) by mouth daily. [place]  tablet 1  . aspirin 325 MG tablet Take 1 tablet (325 mg total) by mouth daily. 30 tablet 0  . atorvastatin (LIPITOR) 10 MG tablet TAKE 1 TABLET (10 MG TOTAL) BY MOUTH DAILY AT 6 PM. 90 tablet 1  . blood glucose meter kit and supplies Dispense based on Insurance. Test blood sugars once daily. Dx: E10.9 1 each 0  . gabapentin (NEURONTIN) 300 MG capsule TAKE 1 CAPSULE (300 MG TOTAL) BY MOUTH AT BEDTIME. 90 capsule 0  . glucose blood (FREESTYLE LITE) test strip Test once daily dx E10.9 100 each 12  . Insulin Glargine (LANTUS SOLOSTAR) 100 UNIT/ML Solostar Pen INJECT 23 UNITS SUBCUTANEOUSLY AT BEDTIME (Patient taking differently: 24 Units. INJECT 24 UNITS SUBCUTANEOUSLY AT BEDTIME) 15 pen 1  . Insulin Pen Needle (B-D ULTRAFINE III SHORT  PEN) 31G X 8 MM MISC USE WITH LANUTS PEN 100 each 3  . losartan-hydrochlorothiazide (HYZAAR) 100-12.5 MG tablet TAKE 1 TABLET BY MOUTH DAILY. 90 tablet 1  . metFORMIN (GLUCOPHAGE) 1000 MG tablet TAKE 1 TABLET (1,000 MG TOTAL) BY MOUTH 2 (TWO) TIMES DAILY WITH A MEAL. 180 tablet 0  . ONETOUCH DELICA LANCETS 70V MISC TEST BLOOD SUGAR ONCE DAILY 100 each 0  . tiZANidine (ZANAFLEX) 2 MG tablet Take 1 tablet (2 mg total) by mouth at bedtime. (Patient not taking: Reported on 07/10/2017) 90 tablet 0   No current facility-administered medications on file prior to visit.     BP (!) 166/80 (BP Location: Left Arm)   Temp 97.6 F (36.4 C) (Oral)   Ht _0  (1.753 m)   Wt 181 lb (82.1 kg)   BMI 26.73 kg/m       Objective:   Physical Exam  Constitutional: He is oriented to person, place, and time. He appears well-developed and well-nourished. No distress.  Cardiovascular: Normal rate, regular rhythm, normal heart sounds and intact distal pulses. Exam reveals no gallop and no friction rub.  No murmur heard. Pulmonary/Chest: Effort normal and breath sounds normal. No respiratory distress. He has no wheezes. He has no rales. He exhibits no tenderness.  Musculoskeletal: He exhibits edema (+1 pitting edmema in lower extemity ).  Neurological: He is alert and oriented to person, place, and time.  Skin: Skin is warm and dry. No rash noted. He is not diaphoretic. No erythema. No pallor.  Psychiatric: He has a normal mood and affect. His behavior is normal. Judgment and thought content normal.  Nursing note and vitals reviewed.     Assessment & Plan:  1. Type 2 diabetes mellitus with diabetic neuropathy, with long-term current use of insulin (HCC)  - POCT A1C- 7.5 - has improved actually. Encouraged nightly insulin, diabetic diet, and frequent exercise. His wife is going to sign him up for water aerobics. I am not going to change his insulin dose at this time.  - Follow up in three months for recheck and  CPE  - metFORMIN (GLUCOPHAGE) 1000 MG tablet; Take 1 tablet (1,000 mg total) 2 (two) times daily with a meal by mouth.  Dispense: 180 tablet; Refill: 0  2. Lower extremity edema  - furosemide (LASIX) 20 MG tablet; Take 1 tablet (20 mg total) every other day by mouth.  Dispense: 90 tablet; Refill: 1  3. Essential hypertension - Not at goal  - furosemide (LASIX) 20 MG tablet; Take 1 tablet (20 mg total) every other day by mouth.  Dispense: 90 tablet; Refill: 1 - metoprolol tartrate (LOPRESSOR) 50 MG tablet; Take 1 tablet (50  mg total) 2 (two) times daily by mouth.  Dispense: 180 tablet; Refill: 0   [name], NP

## 2017-08-19 DIAGNOSIS — E119 Type 2 diabetes mellitus without complications: Secondary | ICD-10-CM | POA: Diagnosis not present

## 2017-08-19 DIAGNOSIS — H4051X3 Glaucoma secondary to other eye disorders, right eye, severe stage: Secondary | ICD-10-CM | POA: Diagnosis not present

## 2017-08-19 DIAGNOSIS — E113212 Type 2 diabetes mellitus with mild nonproliferative diabetic retinopathy with macular edema, left eye: Secondary | ICD-10-CM | POA: Diagnosis not present

## 2017-08-19 DIAGNOSIS — H40012 Open angle with borderline findings, low risk, left eye: Secondary | ICD-10-CM | POA: Diagnosis not present

## 2017-08-19 LAB — HM DIABETES EYE EXAM

## 2017-08-21 ENCOUNTER — Other Ambulatory Visit: Payer: Self-pay | Admitting: Internal Medicine

## 2017-08-21 ENCOUNTER — Encounter: Payer: Self-pay | Admitting: Family Medicine

## 2017-09-27 ENCOUNTER — Other Ambulatory Visit: Payer: Self-pay | Admitting: Adult Health

## 2017-09-27 DIAGNOSIS — I10 Essential (primary) hypertension: Secondary | ICD-10-CM

## 2017-10-01 NOTE — Telephone Encounter (Signed)
He should be taking it. Ok to refill 90 +1

## 2017-10-01 NOTE — Telephone Encounter (Signed)
Sent to the pharmacy by e-scribe. 

## 2017-10-01 NOTE — Telephone Encounter (Signed)
Last filled 08/2016.  Should pt still be taking this medication?

## 2017-10-10 ENCOUNTER — Ambulatory Visit (INDEPENDENT_AMBULATORY_CARE_PROVIDER_SITE_OTHER): Payer: Medicare Other | Admitting: Adult Health

## 2017-10-10 ENCOUNTER — Encounter: Payer: Self-pay | Admitting: Adult Health

## 2017-10-10 VITALS — BP 156/80 | Temp 97.8°F | Ht 67.25 in | Wt 177.0 lb

## 2017-10-10 DIAGNOSIS — Z1159 Encounter for screening for other viral diseases: Secondary | ICD-10-CM | POA: Diagnosis not present

## 2017-10-10 DIAGNOSIS — F339 Major depressive disorder, recurrent, unspecified: Secondary | ICD-10-CM | POA: Diagnosis not present

## 2017-10-10 DIAGNOSIS — I10 Essential (primary) hypertension: Secondary | ICD-10-CM | POA: Diagnosis not present

## 2017-10-10 DIAGNOSIS — E1122 Type 2 diabetes mellitus with diabetic chronic kidney disease: Secondary | ICD-10-CM

## 2017-10-10 DIAGNOSIS — Z794 Long term (current) use of insulin: Secondary | ICD-10-CM | POA: Diagnosis not present

## 2017-10-10 DIAGNOSIS — Z23 Encounter for immunization: Secondary | ICD-10-CM

## 2017-10-10 DIAGNOSIS — I6302 Cerebral infarction due to thrombosis of basilar artery: Secondary | ICD-10-CM | POA: Diagnosis not present

## 2017-10-10 DIAGNOSIS — G6289 Other specified polyneuropathies: Secondary | ICD-10-CM

## 2017-10-10 DIAGNOSIS — E1140 Type 2 diabetes mellitus with diabetic neuropathy, unspecified: Secondary | ICD-10-CM | POA: Diagnosis not present

## 2017-10-10 DIAGNOSIS — N400 Enlarged prostate without lower urinary tract symptoms: Secondary | ICD-10-CM

## 2017-10-10 DIAGNOSIS — N182 Chronic kidney disease, stage 2 (mild): Secondary | ICD-10-CM | POA: Diagnosis not present

## 2017-10-10 DIAGNOSIS — M545 Low back pain, unspecified: Secondary | ICD-10-CM

## 2017-10-10 DIAGNOSIS — G8191 Hemiplegia, unspecified affecting right dominant side: Secondary | ICD-10-CM | POA: Diagnosis not present

## 2017-10-10 LAB — BASIC METABOLIC PANEL
BUN: 32 mg/dL — AB (ref 6–23)
CO2: 27 mEq/L (ref 19–32)
Calcium: 8.9 mg/dL (ref 8.4–10.5)
Chloride: 108 mEq/L (ref 96–112)
Creatinine, Ser: 1.54 mg/dL — ABNORMAL HIGH (ref 0.40–1.50)
GFR: 58.39 mL/min — AB (ref >60.00)
GLUCOSE: 151 mg/dL — AB (ref 70–99)
Potassium: 4.7 mEq/L (ref 3.5–5.1)
SODIUM: 142 meq/L (ref 135–145)

## 2017-10-10 LAB — CBC WITH DIFFERENTIAL/PLATELET
BASOS ABS: 0 10*3/uL (ref 0.0–0.1)
Basophils Relative: 0.7 % (ref 0.0–3.0)
Eosinophils Absolute: 0.2 10*3/uL (ref 0.0–0.7)
Eosinophils Relative: 4.4 % (ref 0.0–5.0)
HCT: 31.4 % — ABNORMAL LOW (ref 39.0–52.0)
Hemoglobin: 10.6 g/dL — ABNORMAL LOW (ref 13.0–17.0)
LYMPHS ABS: 1.4 10*3/uL (ref 0.7–4.0)
Lymphocytes Relative: 34 % (ref 12.0–46.0)
MCHC: 33.7 g/dL (ref 30.0–36.0)
MCV: 90.9 fl (ref 78.0–100.0)
MONO ABS: 0.4 10*3/uL (ref 0.1–1.0)
Monocytes Relative: 8.9 % (ref 3.0–12.0)
NEUTROS PCT: 52 % (ref 43.0–77.0)
Neutro Abs: 2.1 10*3/uL (ref 1.4–7.7)
Platelets: 284 10*3/uL (ref 150.0–400.0)
RBC: 3.45 Mil/uL — AB (ref 4.22–5.81)
RDW: 12.8 % (ref 11.5–15.5)
WBC: 4 10*3/uL (ref 4.0–10.5)

## 2017-10-10 LAB — HEPATIC FUNCTION PANEL
ALBUMIN: 3.6 g/dL (ref 3.5–5.2)
ALK PHOS: 83 U/L (ref 39–117)
ALT: 13 U/L (ref 0–53)
AST: 15 U/L (ref 0–37)
Bilirubin, Direct: 0.2 mg/dL (ref 0.0–0.3)
Total Bilirubin: 0.9 mg/dL (ref 0.2–1.2)
Total Protein: 6.4 g/dL (ref 6.0–8.3)

## 2017-10-10 LAB — LIPID PANEL
Cholesterol: 90 mg/dL (ref 0–200)
HDL: 30.5 mg/dL — AB (ref >39.00)
LDL Cholesterol: 38 mg/dL (ref 0–99)
NONHDL: 59.64
Total CHOL/HDL Ratio: 3
Triglycerides: 107 mg/dL (ref 0.0–149.0)
VLDL: 21.4 mg/dL (ref 0.0–40.0)

## 2017-10-10 LAB — HEMOGLOBIN A1C: Hgb A1c MFr Bld: 7.3 % — ABNORMAL HIGH (ref 4.6–6.5)

## 2017-10-10 LAB — TSH: TSH: 2.9 u[IU]/mL (ref 0.35–4.50)

## 2017-10-10 LAB — PSA: PSA: 0.81 ng/mL (ref 0.10–4.00)

## 2017-10-10 MED ORDER — METOPROLOL TARTRATE 50 MG PO TABS: 50.0000 mg | ORAL_TABLET | Freq: Two times a day (BID) | ORAL | 2 refills | Status: DC

## 2017-10-10 MED ORDER — AMITRIPTYLINE HCL 25 MG PO TABS: 25.0000 mg | ORAL_TABLET | Freq: Every day | ORAL | 0 refills | Status: DC

## 2017-10-10 MED ORDER — TIZANIDINE HCL 2 MG PO TABS: 2.0000 mg | ORAL_TABLET | Freq: Every day | ORAL | 1 refills | Status: DC

## 2017-10-10 NOTE — Patient Instructions (Signed)
It was great seeing you today    I will follow up with you regarding your blood work   I have sent in your medications   I am going to switch you from Gabapentin to Elavil - please follow up with me in one month

## 2017-10-10 NOTE — Progress Notes (Signed)
Subjective:    Patient ID: Russell Austin, male    DOB: 1951-06-22, 67 y.o.   MRN: [id number]  HPI  Patient presents for yearly preventative medicine examination. 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  Due to history of hypertension he takes Norvasc 10 mg, Hyzaar 100- 0.5 mg, and Lopressor 50 mg twice daily. He did not take his medication this morning  BP Readings from Last 3 Encounters:  10/10/17 (!) 156/80  07/10/17 (!) 166/80  05/29/17 (!) 146/76   He takes Lantus 24 units nightly and metformin 1000 mg twice daily for history of diabetes Lab Results  Component Value Date   HGBA1C 7.5 07/10/2017    Also, he takes a statin and aspirin 325 mg for history of hyperlipidemia, status post CVA  He is prescribed a muscle relaxer and gabapentin for spastic hemiparesis. He reports that he is no longer taking Gabapentin as he did not feel as though it was working well. He has been out of his muscle relaxer. His spasticity interferes with ADL's, mobility, and hygiene.   All immunizations and health maintenance protocols were reviewed with the patient and needed orders were placed. He is due for his first pneumonia shot   Appropriate screening laboratory values were ordered for the patient including screening of hyperlipidemia, renal function and hepatic function. If indicated by BPH, a PSA was ordered.  Medication reconciliation,  past medical history, social history, problem list and allergies were reviewed in detail with the patient  Goals were established with regard to weight loss, exercise, and  diet in compliance with medications  He participates in routine dental and vision screens.  He reports that his last colonoscopy was  in 2016 at [facility]( I do not have these records).   His wife, who is with him today reports that Russell Austin has been more depressed than usual. He often holds his head down and looks at the ground. He told his wife last week that he feels as though he is a failure as he can not provide for his family after his stroke in 2016. She also reports that he has told him multiple times that he " has given up on life". Russell Austin denies giving up on life but does feel as though he is a failure.    Review of Systems  HENT: Negative.   Eyes: Negative.   Respiratory: Negative.   Cardiovascular: Negative.   Gastrointestinal: Negative.   Endocrine: Negative.   Genitourinary: Negative.   Musculoskeletal: Positive for arthralgias and gait problem.  Skin: Negative.   Allergic/Immunologic: Negative.   Neurological: Positive for weakness.  Hematological: Negative.   Psychiatric/Behavioral: Positive for decreased concentration. Negative for behavioral problems, sleep disturbance and suicidal ideas. The patient is not nervous/anxious.   All other systems reviewed and are negative.  Past Medical History:  Diagnosis Date  . 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 Diabetes mellitus type II   . Diabetic retinopathy FOLLOWED BY DR Zadie Rhine  . ED (erectile dysfunction)   . Glaucoma of both eyes   . Hypertension   . Left hydrocele   . Stroke [facility])     Social History   Socioeconomic History  . Marital status: Married    Spouse name:  Not on file  . Number of children: 1  . Years of education: 53  . Highest education level: Not on file  Social Needs  . Financial resource strain: Not on file  . Food insecurity - worry: Not on file  . Food insecurity - inability: Not on file  . Transportation needs - medical: Not on file  . Transportation needs - non-medical: Not on file  Occupational History  . Occupation: Disabled  Tobacco Use  . Smoking status: Never Smoker  . Smokeless  tobacco: Never Used  Substance and Sexual Activity  . Alcohol use: No  . Drug use: No  . Sexual activity: Not on file  Other Topics Concern  . Not on file  Social History Narrative   Lives at home with his wife and granddaughter.   Left-handed.   3 cups caffeine per day.    Past Surgical History:  Procedure Laterality Date  . APPENDECTOMY  AGE EARLY 20'S  . HYDROCELE EXCISION  03/31/2012   Procedure: HYDROCELECTOMY ADULT;  Surgeon: Franchot Gallo, MD;  Location: [facility];  Service: Urology;  Laterality: Left;  45 MINS    . LEFT EYE LASER RETINA REPAIR  SEPT 2012  . RIGHT EYE VITRECTOMY/ INSERTION GLAUCOMA SETON/ LASER REPAIR  12-13-2008   RETINAL ARTERY OCCLUSION /NEOVASCULAR GLAUCOMA/ HEMORRHAGE  . RIGHT EYE VITRETOMY/ INSERTION GLAUCOMA SETON X2/ LASER  03-24-2009   RECURRENT HEMORRHAGE/ OCCLUSION INTERNAL SETON  . SHOULDER ARTHROSCOPY Right 2005  . undescended right testicle removed  1994    Family History  Problem Relation Age of Onset  . Glaucoma Mother   . Diabetes Mother   . Stomach cancer Maternal Grandfather   . Stroke Maternal Grandmother     Allergies  Allergen Reactions  . Lactose Intolerance (Gi) Diarrhea  . Apple Pectin [Pectin] Itching    ITCHY THROAT  . Peach [Prunus Persica] Itching    ITCHY THROAT  . Morphine And Related Anxiety    Jittery    Current Outpatient Medications on File Prior to Visit  Medication Sig Dispense Refill  . amLODipine (NORVASC) 10 MG tablet TAKE 1 TABLET EVERY DAY 90 tablet 1  . aspirin 325 MG tablet Take 1 tablet (325 mg total) by mouth daily. 30 tablet 0  . atorvastatin (LIPITOR) 10 MG tablet TAKE 1 TABLET (10 MG TOTAL) BY MOUTH DAILY AT 6 PM. 90 tablet 2  . blood glucose meter kit and supplies Dispense based on Insurance. Test blood sugars once daily. Dx: E10.9 1 each 0  . furosemide (LASIX) 20 MG tablet Take 1 tablet (20 mg total) every other day by mouth. 90 tablet 1  . glucose blood (FREESTYLE  LITE) test strip Test once daily dx E10.9 100 each 12  . Insulin Glargine (LANTUS SOLOSTAR) 100 UNIT/ML Solostar Pen INJECT 23 UNITS SUBCUTANEOUSLY AT BEDTIME (Patient taking differently: 24 Units. INJECT 24 UNITS SUBCUTANEOUSLY AT BEDTIME) 15 pen 1  . Insulin Pen Needle (B-D ULTRAFINE III SHORT PEN) 31G X 8 MM MISC USE WITH LANUTS PEN 100 each 3  . losartan-hydrochlorothiazide (HYZAAR) 100-12.5 MG tablet TAKE 1 TABLET BY MOUTH DAILY. 90 tablet 1  . metFORMIN (GLUCOPHAGE) 1000 MG tablet Take 1 tablet (1,000 mg total) 2 (two) times daily with a meal by mouth. 180 tablet 0  . ONETOUCH DELICA LANCETS 09X MISC TEST BLOOD SUGAR ONCE DAILY 100 each 0  . tiZANidine (ZANAFLEX) 2 MG tablet Take 1 tablet (2 mg total) by mouth at bedtime. (Patient not taking: Reported  on 07/10/2017) 90 tablet 0   No current facility-administered medications on file prior to visit.     BP (!) 156/80 (BP Location: Left Arm)   Temp 97.8 F (36.6 C) (Oral)   Ht 5' 7.25" (1.708 m)   Wt 177 lb (80.3 kg)   BMI 27.52 kg/m       Objective:   Physical Exam  Constitutional: He is oriented to person, place, and time. He appears well-developed and well-nourished. No distress.  HENT:  Head: Normocephalic and atraumatic.  Right Ear: External ear normal.  Left Ear: External ear normal.  Nose: Nose normal.  Mouth/Throat: Oropharynx is clear and moist. No oropharyngeal exudate.  Eyes: Conjunctivae and EOM are normal. Pupils are equal, round, and reactive to light. Right eye exhibits no discharge. Left eye exhibits no discharge. No scleral icterus.  Neck: Trachea normal and normal range of motion. Neck supple. No JVD present. Carotid bruit is not present. No tracheal deviation present. No thyroid mass and no thyromegaly present.  Cardiovascular: Normal rate, regular rhythm, normal heart sounds and intact distal pulses. Exam reveals no gallop and no friction rub.  No murmur heard. Pulmonary/Chest: Effort normal and breath sounds  normal. No stridor. No respiratory distress. He has no wheezes. He has no rales. He exhibits no tenderness.  Abdominal: Soft. Bowel sounds are normal. He exhibits no distension and no mass. There is no tenderness. There is no rebound and no guarding.  Genitourinary:  Genitourinary Comments: Deferred   Musculoskeletal: Normal range of motion. He exhibits edema (+ 1 pitting edema in lower extemities ) and tenderness.  Lymphadenopathy:    He has no cervical adenopathy.  Neurological: He is alert and oriented to person, place, and time. He has normal reflexes. He displays normal reflexes. No cranial nerve deficit or sensory deficit. He exhibits normal muscle tone. Coordination normal.  3/5 grip strength in right hand 5/5 grip strength in left hand  Rigidity noted throughout right side  Skin: Skin is warm and dry. No rash noted. He is not diaphoretic. No erythema. No pallor.  Psychiatric: He has a normal mood and affect. His behavior is normal. Judgment and thought content normal.  Nursing note and vitals reviewed.     Assessment & Plan:  1. Essential hypertension - Not controlled due to not taking BP medication - Asked to come in later in the day for next appointment so that we could see how his BP medications are working  - Basic metabolic panel - CBC with Differential/Platelet - Hemoglobin A1c - Hepatic function panel - Lipid panel - TSH - metoprolol tartrate (LOPRESSOR) 50 MG tablet; Take 1 tablet (50 mg total) by mouth 2 (two) times daily.  Dispense: 180 tablet; Refill: 2  2. Type 2 diabetes mellitus with diabetic neuropathy, with long-term current use of insulin (HCC) - consider increase in lantus  - Basic metabolic panel - CBC with Differential/Platelet - Hemoglobin A1c - Hepatic function panel - Lipid panel - TSH  3. CKD stage 2 due to type 2 diabetes mellitus (HCC)  - Basic metabolic panel - CBC with Differential/Platelet - Hemoglobin A1c - Hepatic function panel -  Lipid panel - TSH  4. 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 Benign prostatic hyperplasia without lower urinary tract symptoms  - PSA  6. Need for hepatitis C screening test  - Hep  C Antibody   7. Need for vaccination with 13-polyvalent pneumococcal conjugate vaccine  - Pneumococcal conjugate vaccine 13-valent  8. Other polyneuropathy - Will start on Elavil and d/c gabapentin. I am hopeful that the elavil will also help with his depression.  - amitriptyline (ELAVIL) 25 MG tablet; Take 1 tablet (25 mg total) by mouth at bedtime.  Dispense: 90 tablet; Refill: 0 - tiZANidine (ZANAFLEX) 2 MG tablet; Take 1 tablet (2 mg total) by mouth at bedtime.  Dispense: 90 tablet; Refill: 1  9. Right hemiparesis ([place]) - Will start on   - tiZANidine (ZANAFLEX) 2 MG tablet; Take 1 tablet (2 mg total) by mouth at bedtime.  Dispense: 90 tablet; Refill: 1  10. Depression, recurrent (HCC)  - amitriptyline (ELAVIL) 25 MG tablet; Take 1 tablet (25 mg total) by mouth at bedtime.  Dispense: 90 tablet; Refill: 0 - Follow up in one month   [name], NP

## 2017-10-11 LAB — HEPATITIS C ANTIBODY
HEP C AB: NONREACTIVE
SIGNAL TO CUT-OFF: 0.01 (ref <1.00)

## 2017-10-17 ENCOUNTER — Telehealth: Payer: Self-pay | Admitting: Adult Health

## 2017-10-17 NOTE — Telephone Encounter (Signed)
Copied from [place]. Topic: General - Other >> Oct 17, 2017  4:02 PM Darl Householder, RMA wrote: Reason for CRM: Pharmacy is requesting a prior authorization for amitriptyline (ELAVIL) 25 MG, pharmacy has faxed over form on 10/18/17

## 2017-10-21 NOTE — Telephone Encounter (Signed)
Prior auth sent to [email or web address].

## 2017-10-25 ENCOUNTER — Other Ambulatory Visit: Payer: Self-pay | Admitting: Adult Health

## 2017-10-25 DIAGNOSIS — Z23 Encounter for immunization: Secondary | ICD-10-CM

## 2017-10-25 DIAGNOSIS — G8191 Hemiplegia, unspecified affecting right dominant side: Secondary | ICD-10-CM

## 2017-10-25 DIAGNOSIS — F339 Major depressive disorder, recurrent, unspecified: Secondary | ICD-10-CM

## 2017-10-25 NOTE — Telephone Encounter (Signed)
DENIED. THIS PRESCRIPTION WAS SENT IN ON 10/10/2017 FOR 90 DAY SUPPLY.

## 2017-10-25 NOTE — Telephone Encounter (Signed)
Fax received from [place] stating the Rx was approved from 09/01/2017-09/02/2018.  I called [facility] and informed Caryl Pina of this.

## 2017-11-11 ENCOUNTER — Other Ambulatory Visit (INDEPENDENT_AMBULATORY_CARE_PROVIDER_SITE_OTHER): Payer: Medicare Other

## 2017-11-11 DIAGNOSIS — I10 Essential (primary) hypertension: Secondary | ICD-10-CM

## 2017-11-11 LAB — BASIC METABOLIC PANEL
BUN: 26 mg/dL — ABNORMAL HIGH (ref 6–23)
CALCIUM: 9.3 mg/dL (ref 8.4–10.5)
CO2: 27 mEq/L (ref 19–32)
Chloride: 106 mEq/L (ref 96–112)
Creatinine, Ser: 1.5 mg/dL (ref 0.40–1.50)
GFR: 60.18 mL/min (ref >60.00)
GLUCOSE: 140 mg/dL — AB (ref 70–99)
Potassium: 4.4 mEq/L (ref 3.5–5.1)
SODIUM: 142 meq/L (ref 135–145)

## 2017-12-13 ENCOUNTER — Ambulatory Visit (INDEPENDENT_AMBULATORY_CARE_PROVIDER_SITE_OTHER): Payer: Medicare Other | Admitting: Adult Health

## 2017-12-13 ENCOUNTER — Encounter: Payer: Self-pay | Admitting: Adult Health

## 2017-12-13 VITALS — BP 140/70 | Temp 97.8°F | Ht 67.0 in | Wt 177.0 lb

## 2017-12-13 DIAGNOSIS — M25512 Pain in left shoulder: Secondary | ICD-10-CM

## 2017-12-13 DIAGNOSIS — I6302 Cerebral infarction due to thrombosis of basilar artery: Secondary | ICD-10-CM | POA: Diagnosis not present

## 2017-12-13 MED ORDER — CYCLOBENZAPRINE HCL 10 MG PO TABS: 10.0000 mg | ORAL_TABLET | Freq: Every day | ORAL | 0 refills | Status: DC

## 2017-12-13 NOTE — Progress Notes (Signed)
Subjective:    Patient ID: Russell Austin, male    DOB: 1951/03/16, 67 y.o.   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  He presents to the office today for the complaint of left shoulder pain. He fell a few months ago. The pain has not been improving and instead getting worse as time goes on. He reports that he has noticed it is hard to hold a coffee cup as his grip strength is decreasing. Pain in left shoulder is worse with movement. He has  A hard time reaching for items in the cupboards and fridge due to pain. Has radiating pain down his tricep.   Denies any numbness or tingling   Review of Systems See HPI   Past Medical History:  Diagnosis Date  . 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 Diabetes mellitus type II   . Diabetic retinopathy FOLLOWED BY DR Zadie Rhine  . ED (erectile dysfunction)   . Glaucoma of both eyes   . Hypertension   . Left hydrocele   . Stroke [facility])     Social History   Socioeconomic History  . Marital status: Married    Spouse name: Not on file  . Number of children: 1  . Years of education: 36  . Highest education level: Not on file  Occupational History  . Occupation: Disabled  Social Needs  . Financial resource strain: Not on file  . Food insecurity:    Worry: Not on file    Inability: Not on file  . Transportation needs:    Medical: Not on file    Non-medical: Not on file  Tobacco Use  . Smoking status: Never Smoker  . Smokeless tobacco: Never Used  Substance and Sexual Activity  . Alcohol use: No  . Drug use: No  . Sexual activity: Not on file  Lifestyle  . Physical activity:    Days per week: Not on file    Minutes per session: Not on file  . Stress: Not  on file  Relationships  . Social connections:    Talks on phone: Not on file    Gets together: Not on file    Attends religious service: Not on file    Active member of club or organization: Not on file    Attends meetings of clubs or organizations: Not on file    Relationship status: Not on file  . Intimate partner violence:    Fear of current or ex partner: Not on file    Emotionally abused: Not on file    Physically abused: Not on file    Forced sexual activity: Not on file  Other Topics Concern  . Not on file  Social History Narrative   Lives at home with his wife and granddaughter.   Left-handed.   3 cups caffeine per day.    Past Surgical History:  Procedure Laterality Date  . APPENDECTOMY  AGE EARLY 20'S  . HYDROCELE EXCISION  03/31/2012   Procedure: HYDROCELECTOMY ADULT;  Surgeon: Franchot Gallo, MD;  Location: [facility];  Service: Urology;  Laterality: Left;  45 MINS    . LEFT EYE LASER RETINA REPAIR  SEPT 2012  . RIGHT EYE VITRECTOMY/ INSERTION GLAUCOMA SETON/ LASER REPAIR  12-13-2008   RETINAL ARTERY OCCLUSION /NEOVASCULAR GLAUCOMA/ HEMORRHAGE  . RIGHT EYE VITRETOMY/ INSERTION GLAUCOMA SETON X2/ LASER  03-24-2009   RECURRENT HEMORRHAGE/ OCCLUSION INTERNAL SETON  . SHOULDER ARTHROSCOPY Right 2005  . undescended right testicle removed  1994    Family History  Problem Relation Age of Onset  . Glaucoma Mother   . Diabetes Mother   . Stomach cancer Maternal Grandfather   . Stroke Maternal Grandmother     Allergies  Allergen Reactions  . Lactose Intolerance (Gi) Diarrhea  . Apple Pectin [Pectin] Itching    ITCHY THROAT  . Peach [Prunus Persica] Itching    ITCHY THROAT  . Morphine And Related Anxiety    Jittery    Current Outpatient Medications on File Prior to Visit  Medication Sig Dispense Refill  . amitriptyline (ELAVIL) 25 MG tablet Take 1 tablet (25 mg total) by mouth at bedtime. 90 tablet 0  . amLODipine (NORVASC) 10 MG tablet  TAKE 1 TABLET EVERY DAY 90 tablet 1  . aspirin 325 MG tablet Take 1 tablet (325 mg total) by mouth daily. 30 tablet 0  . atorvastatin (LIPITOR) 10 MG tablet TAKE 1 TABLET (10 MG TOTAL) BY MOUTH DAILY AT 6 PM. 90 tablet 2  . blood glucose meter kit and supplies Dispense based on Insurance. Test blood sugars once daily. Dx: E10.9 1 each 0  . furosemide (LASIX) 20 MG tablet Take 1 tablet (20 mg total) every other day by mouth. 90 tablet 1  . glucose blood (FREESTYLE LITE) test strip Test once daily dx E10.9 100 each 12  . Insulin Glargine (LANTUS SOLOSTAR) 100 UNIT/ML Solostar Pen INJECT 23 UNITS SUBCUTANEOUSLY AT BEDTIME (Patient taking differently: 24 Units. INJECT 24 UNITS SUBCUTANEOUSLY AT BEDTIME) 15 pen 1  . Insulin Pen Needle (B-D ULTRAFINE III SHORT PEN) 31G X 8 MM MISC USE WITH LANUTS PEN 100 each 3  . losartan-hydrochlorothiazide (HYZAAR) 100-12.5 MG tablet TAKE 1 TABLET BY MOUTH DAILY. 90 tablet 1  . metFORMIN (GLUCOPHAGE) 1000 MG tablet Take 1 tablet (1,000 mg total) 2 (two) times daily with a meal by mouth. 180 tablet 0  . metoprolol tartrate (LOPRESSOR) 50 MG tablet Take 1 tablet (50 mg total) by mouth 2 (two) times daily. 180 tablet 2  . ONETOUCH DELICA LANCETS 77C MISC TEST BLOOD SUGAR ONCE DAILY 100 each 0  . tiZANidine (ZANAFLEX) 2 MG tablet Take 1 tablet (2 mg total) by mouth at bedtime. 90 tablet 1   No current facility-administered medications on file prior to visit.     BP 140/70   Temp 97.8 F (36.6 C) (Oral)   Ht 5' 7"  (1.702 m)   Wt 177 lb (80.3 kg)   BMI 27.72 kg/m       Objective:   Physical Exam  Constitutional: He is oriented to person, place, and time. He appears well-developed and well-nourished. No distress.  Cardiovascular: Normal rate, regular rhythm, normal heart sounds and intact distal pulses. Exam reveals no gallop and no friction rub.  No murmur heard. Pulmonary/Chest: Effort normal and breath sounds normal. No respiratory distress. He has no  wheezes. He has no rales. He exhibits no tenderness.  Musculoskeletal: He exhibits tenderness.  Decreased ROM with left arm raise. Has pain in shoulder at halfway point when raising arm above head. Pain with palpation along  Glenohumeral joint and triceps.   Normal distal pulses.   Pain with empty can test  Neurological: He is alert and oriented to person, place, and time.  Skin: Skin is warm and dry. No rash noted. He is not diaphoretic. No erythema. No pallor.  Psychiatric: He has a normal mood and affect. His behavior is normal. Judgment and thought content normal.  Nursing note and vitals reviewed.     Assessment & Plan:  1. Acute pain of left shoulder - Concern for rotator cuff injury. Will send to sports medicine. Flexeril as needed for pain relief - Ambulatory referral to Sports Medicine - cyclobenzaprine (FLEXERIL) 10 MG tablet; Take 1 tablet (10 mg total) by mouth at bedtime.  Dispense: 30 tablet; Refill: 0   [name], NP

## 2017-12-19 ENCOUNTER — Ambulatory Visit: Payer: Self-pay

## 2017-12-19 ENCOUNTER — Encounter: Payer: Self-pay | Admitting: Sports Medicine

## 2017-12-19 ENCOUNTER — Ambulatory Visit (INDEPENDENT_AMBULATORY_CARE_PROVIDER_SITE_OTHER): Payer: Medicare Other | Admitting: Sports Medicine

## 2017-12-19 ENCOUNTER — Ambulatory Visit (INDEPENDENT_AMBULATORY_CARE_PROVIDER_SITE_OTHER): Payer: Medicare Other

## 2017-12-19 VITALS — BP 146/76 | HR 71 | Ht 67.0 in | Wt 178.8 lb

## 2017-12-19 DIAGNOSIS — M25512 Pain in left shoulder: Secondary | ICD-10-CM

## 2017-12-19 DIAGNOSIS — I6302 Cerebral infarction due to thrombosis of basilar artery: Secondary | ICD-10-CM

## 2017-12-19 NOTE — Progress Notes (Signed)
  [name]. Tony Granquist, [place] at [place]  KALEP FULL - 67 y.o. male MRN [id number]  Date of birth: 01-25-1951  Visit Date: 12/19/2017  PCP: [name], NP   Referred by: [name], NP  Scribe for today's visit: Josepha Pigg, CMA     SUBJECTIVE:  Russell Austin is here for No chief complaint on file.  His L shoulder pain symptoms INITIALLY: Began after a fall a few months ago.  Described as moderate sharp, radiating to tricep Worsened with movement, overhead reaching, reaching back and down. Pain is anterior.  Improved with rest but still has pain.  Additional associated symptoms include: denies n/t. Reports decreased ROM and grip strength.     At this time symptoms are worsening compared to onset.  He has been prescribed Flexeril to try for the pain but just picked it up yesterday.     ROS Reports night time disturbances. Denies fevers, chills, or night sweats. Denies unexplained weight loss. Denies personal history of cancer. Denies changes in bowel or bladder habits. Denies recent unreported falls. Denies new or worsening dyspnea or wheezing. Denies headaches or dizziness.  Reports weakness in L arm/hand  Denies dizziness or presyncopal episodes Denies lower extremity edema    HISTORY & PERTINENT PRIOR DATA:  Prior History reviewed and updated per electronic medical record.  Significant/pertinent history, findings, studies include:  reports that he has never smoked. He has never used smokeless tobacco. Recent Labs    03/27/17 0730 07/10/17 0722 10/10/17 0752  HGBA1C 7.8* 7.5 7.3*   [facility] No problems updated.  OBJECTIVE:  VS:  HT:5\' 7"  (170.2 cm)   WT:178 lb 12.8 oz (81.1 kg)  BMI:28    BP:(Abnormal) 146/76  HR:71bpm  TEMP: ( )  RESP:97 %   PHYSICAL EXAM: Constitutional: WDWN, Non-toxic appearing. Psychiatric: Alert & appropriately  interactive.  Not depressed or anxious appearing. Respiratory: No increased work of breathing.  Trachea Midline Eyes: Pupils are equal.  EOM intact without nystagmus.  No scleral icterus  Vascular Exam: warm to touch no edema  upper extremity neuro exam: unremarkable normal strength normal sensation normal reflexes  MSK Exam: Pain with axial load and circumduction.  He has somewhat limited overhead range of motion.  Intrinsic rotator cuff strength is intact.   ASSESSMENT & PLAN:   1. Acute pain of left shoulder     PLAN: Intra-articular injection performed today.  Follow-up in 3 months to consider repeat injection as needed.  If any persistent ongoing symptoms or worsening pain in spite of appropriate measures could consider further diagnostic evaluation given slight irregularity of the bony architecture  Follow-up: Return in about 3 months (around 03/13/2018).      Please see additional documentation for Objective, Assessment and Plan sections. Pertinent additional documentation may be included in corresponding procedure notes, imaging studies, problem based documentation and patient instructions. Please see these sections of the encounter for additional information regarding this visit.  CMA/ATC served as Education administrator during this visit. History, Physical, and Plan performed by medical provider. Documentation and orders reviewed and attested to.      Gerda Diss, [facility] Physician

## 2017-12-19 NOTE — Progress Notes (Signed)
PROCEDURE NOTE:  Ultrasound Guided: Injection: Left shoulder Images were obtained and interpreted by myself, Teresa Coombs, DO  Images have been saved and stored to PACS system. Images obtained on: GE S7 Ultrasound machine    ULTRASOUND FINDINGS:  Small joint effusion  DESCRIPTION OF PROCEDURE:  The patient's clinical condition is marked by substantial pain and/or significant functional disability. Other conservative therapy has not provided relief, is contraindicated, or not appropriate. There is a reasonable likelihood that injection will significantly improve the patient's pain and/or functional impairment.   After discussing the risks, benefits and expected outcomes of the injection and all questions were reviewed and answered, the patient wished to undergo the above named procedure.  Verbal consent was obtained.  The ultrasound was used to identify the target structure and adjacent neurovascular structures. The skin was then prepped in sterile fashion and the target structure was injected under direct visualization using sterile technique as below:  PREP: Alcohol and Ethel Chloride APPROACH: posterior, sterile needle exchange technique, 21g 2 in. INJECTATE: 3 cc 1% lidocaine, 2 cc 0.5% Marcaine and 1 cc 40mg /mL DepoMedrol ASPIRATE: None DRESSING: Band-Aid  Post procedural instructions including recommending icing and warning signs for infection were reviewed.    This procedure was well tolerated and there were no complications.   IMPRESSION: Succesful Ultrasound Guided: Injection

## 2017-12-19 NOTE — Progress Notes (Signed)
X-Rays obtained at [place] Interpreted by myself Gerda Diss, DO) during office visit.  Results were reviewed with the patient at the time of the visit.   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  Overall sub-acromial space is well-maintained he does have inferior and posterior osteophytic changes of both the glenoid and humeral aspects.  Type III acromion  IMPRESSION:  Glenohumeral and AC joint arthritis.  Slight irregular appearance of the bony architecture consistent with osteopenic changes.

## 2017-12-19 NOTE — Patient Instructions (Signed)

## 2017-12-23 ENCOUNTER — Telehealth: Payer: Self-pay | Admitting: Adult Health

## 2017-12-23 NOTE — Telephone Encounter (Signed)
Copied from [place] [phone]. Topic: General - Other >> Dec 23, 2017 12:39 PM Darl Householder, RMA wrote: Reason for CRM: Refill request for glucose blood (FREESTYLE LITE) test strip to be [facility] to [facility] [address]

## 2017-12-23 NOTE — Telephone Encounter (Signed)
Request for Freestyle Lite test strips, 100 count. Prescription expired on 12/04/17.  LOV  12/13/17 Provider  [name], NP  [facility] [phone]  [place]  Please review

## 2017-12-24 ENCOUNTER — Other Ambulatory Visit: Payer: Self-pay | Admitting: Adult Health

## 2017-12-24 ENCOUNTER — Telehealth: Payer: Self-pay | Admitting: Sports Medicine

## 2017-12-24 MED ORDER — GLUCOSE BLOOD VI STRP: ORAL_STRIP | 3 refills | Status: DC

## 2017-12-24 NOTE — Telephone Encounter (Signed)
He has Zanaflex on his medication list that I prescribed. Can he not use this?

## 2017-12-24 NOTE — Telephone Encounter (Signed)
Called and spoke to pt informing him to take the Zanaflex in place of the Flexeril.  I informed him that if he can't find that medication at home, then he can call his pharmacy to ask for a refill or call [facility] office.

## 2017-12-24 NOTE — Telephone Encounter (Signed)
Russell Austin, can you change the medication?  Would guess baclofen would be covered.

## 2017-12-24 NOTE — Telephone Encounter (Signed)
Left a message for a return call.

## 2017-12-24 NOTE — Telephone Encounter (Signed)
I spoke with Russell Austin and he stated the prior auth does not need to be done as the pt has Zanaflex to take instead.

## 2017-12-24 NOTE — Telephone Encounter (Signed)
Copied from [place] [phone]. Topic: Quick Communication - See Telephone Encounter >> Dec 24, 2017  8:54 AM Bea Graff, NT wrote: CRM for notification. See Telephone encounter for: 12/24/17. Pt calling and states when he saw Dr. Paulla Fore last week that a muscle relaxer would be called into his pharmacy and his pharmacy has not received that medication. Pt would like to see if this can be called in? [id number] [address]. [phone] (Phone) [phone] (Fax)

## 2017-12-24 NOTE — Telephone Encounter (Signed)
Sent to the pharmacy by e-scribe. 

## 2017-12-24 NOTE — Telephone Encounter (Signed)
See note

## 2017-12-30 ENCOUNTER — Telehealth: Payer: Self-pay | Admitting: Adult Health

## 2017-12-30 NOTE — Telephone Encounter (Signed)
Left VM to call back re: refill request. See telephone encounters from 12/24/17.

## 2017-12-30 NOTE — Telephone Encounter (Signed)
Patient called, left VM that [facility] and he should have plenty refills. Advised to call the office with questions.

## 2017-12-30 NOTE — Telephone Encounter (Signed)
Copied from [place] [phone]. Topic: Quick Communication - Rx Refill/Question >> Dec 30, 2017 10:56 AM Selinda Flavin B, NT wrote: Medication: tiZANidine (ZANAFLEX) 2 MG tablet Has the patient contacted their pharmacy? Yes.   (Agent: If no, request that the patient contact the pharmacy for the refill.) Preferred Pharmacy (with phone number or street name): [id number] [place], [place]. Agent: Please be advised that RX refills may take up to 3 business days. We ask that you follow-up with your pharmacy.

## 2018-01-01 DIAGNOSIS — E113312 Type 2 diabetes mellitus with moderate nonproliferative diabetic retinopathy with macular edema, left eye: Secondary | ICD-10-CM | POA: Diagnosis not present

## 2018-01-01 DIAGNOSIS — H3582 Retinal ischemia: Secondary | ICD-10-CM | POA: Diagnosis not present

## 2018-01-01 DIAGNOSIS — H31012 Macula scars of posterior pole (postinflammatory) (post-traumatic), left eye: Secondary | ICD-10-CM | POA: Diagnosis not present

## 2018-01-09 DIAGNOSIS — J4540 Moderate persistent asthma, uncomplicated: Secondary | ICD-10-CM | POA: Diagnosis not present

## 2018-01-09 DIAGNOSIS — J3089 Other allergic rhinitis: Secondary | ICD-10-CM | POA: Diagnosis not present

## 2018-01-10 ENCOUNTER — Encounter: Payer: Self-pay | Admitting: Sports Medicine

## 2018-01-12 ENCOUNTER — Other Ambulatory Visit: Payer: Self-pay | Admitting: Adult Health

## 2018-01-12 DIAGNOSIS — Z794 Long term (current) use of insulin: Principal | ICD-10-CM

## 2018-01-12 DIAGNOSIS — E1140 Type 2 diabetes mellitus with diabetic neuropathy, unspecified: Secondary | ICD-10-CM

## 2018-01-14 NOTE — Telephone Encounter (Signed)
Needs A1C check 

## 2018-01-15 NOTE — Telephone Encounter (Signed)
Sent to the pharmacy by e-scribe.  Pt has made an appt to see [name] on 01/24/18

## 2018-01-15 NOTE — Telephone Encounter (Signed)
Per pt, will call back later this afternoon once he has spoke to his wife.

## 2018-01-17 ENCOUNTER — Telehealth: Payer: Self-pay | Admitting: Adult Health

## 2018-01-17 ENCOUNTER — Other Ambulatory Visit: Payer: Self-pay | Admitting: Adult Health

## 2018-01-17 ENCOUNTER — Encounter: Payer: Self-pay | Admitting: Family Medicine

## 2018-01-17 DIAGNOSIS — R600 Localized edema: Secondary | ICD-10-CM

## 2018-01-17 DIAGNOSIS — I10 Essential (primary) hypertension: Secondary | ICD-10-CM

## 2018-01-17 MED ORDER — LISINOPRIL-HYDROCHLOROTHIAZIDE 20-12.5 MG PO TABS
2.0000 | ORAL_TABLET | Freq: Every day | ORAL | 0 refills | Status: DC
Start: 2018-01-17 — End: 2018-01-17

## 2018-01-17 NOTE — Telephone Encounter (Signed)
Sent to the pharmacy by e-scribe. 

## 2018-01-17 NOTE — Telephone Encounter (Signed)
Sent to the pharmacy.  Informed pt.  At that time the pt notified me that he had been in touch with his pharmacy and his lot # was not affected.  Called pharmacy to cancel new rx request.  No further action required.

## 2018-01-17 NOTE — Telephone Encounter (Signed)
Lisinopril-HCTZ 40-25mg 

## 2018-01-17 NOTE — Telephone Encounter (Signed)
Copied from [place] [phone]. Topic: Quick Communication - See Telephone Encounter >> Jan 17, 2018  9:14 AM Boyd Kerbs wrote: CRM for notification. See Telephone encounter for: 01/17/18.  [facility] regarding losartan-hydrochlorothiazide (HYZAAR) 100-12.5 MG tablet and the recall. Please advise pt.

## 2018-01-17 NOTE — Telephone Encounter (Signed)
Please advise new rx.  Pt is scheduled for follow up on 01/24/18 for A1C.

## 2018-01-24 ENCOUNTER — Encounter: Payer: Self-pay | Admitting: Adult Health

## 2018-01-24 ENCOUNTER — Ambulatory Visit (INDEPENDENT_AMBULATORY_CARE_PROVIDER_SITE_OTHER): Payer: Medicare Other | Admitting: Adult Health

## 2018-01-24 VITALS — BP 146/66 | Temp 97.9°F | Wt 175.0 lb

## 2018-01-24 DIAGNOSIS — I6302 Cerebral infarction due to thrombosis of basilar artery: Secondary | ICD-10-CM | POA: Diagnosis not present

## 2018-01-24 DIAGNOSIS — Z794 Long term (current) use of insulin: Secondary | ICD-10-CM | POA: Diagnosis not present

## 2018-01-24 DIAGNOSIS — E1140 Type 2 diabetes mellitus with diabetic neuropathy, unspecified: Secondary | ICD-10-CM

## 2018-01-24 LAB — POCT GLYCOSYLATED HEMOGLOBIN (HGB A1C): Hemoglobin A1C: 7.3 % — AB (ref 4.0–5.6)

## 2018-01-24 NOTE — Progress Notes (Signed)
Subjective:    Patient ID: Russell Austin, male    DOB: 24-Jul-1951, 67 y.o.   MRN: [id number]  Diabetes  He presents for his follow-up diabetic visit. He has type 2 diabetes mellitus. His disease course has been improving. There are no hypoglycemic associated symptoms. There are no diabetic associated symptoms. There are no hypoglycemic complications. Symptoms are stable. Diabetic complications include a CVA, peripheral neuropathy and PVD. Risk factors for coronary artery disease include diabetes mellitus, dyslipidemia, hypertension, male sex, sedentary lifestyle and family history. Current diabetic treatment includes diet, insulin injections and oral agent (monotherapy) (Lantus and Metfomrin ). He is compliant with treatment most of the time. He is currently taking insulin at bedtime. Insulin injections are given by adult caretaker. His weight is stable. He is following a generally unhealthy diet. He has not had a previous visit with a dietitian. He rarely participates in exercise. An ACE inhibitor/angiotensin II receptor blocker is being taken. He does not see a podiatrist.Eye exam is current.   He reports blood sugar readings around 130-150. He did have one episode of a blood sugar above 200.   He has not been taking Insulin every night as he thought he did not need to take it if his blood sugars were in the 130's. He reports taking it three times a week.   Lab Results  Component Value Date   HGBA1C 7.3 (H) 10/10/2017   He reports that he has been staying more active. Diet continues to be poor.   Review of Systems See HPI   Past Medical History:  Diagnosis Date  . 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 Diabetes mellitus type II   . Diabetic retinopathy FOLLOWED BY DR Zadie Rhine  . ED (erectile dysfunction)   . Glaucoma of both eyes   . Hypertension   . Left hydrocele   . Stroke [facility])     Social History   Socioeconomic History  . Marital status: Married   Spouse name: Not on file  . Number of children: 1  . Years of education: 44  . Highest education level: Not on file  Occupational History  . Occupation: Disabled  Social Needs  . Financial resource strain: Not on file  . Food insecurity:    Worry: Not on file    Inability: Not on file  . Transportation needs:    Medical: Not on file    Non-medical: Not on file  Tobacco Use  . Smoking status: Never Smoker  . Smokeless tobacco: Never Used  Substance and Sexual Activity  . Alcohol use: No  . Drug use: No  . Sexual activity: Not on file  Lifestyle  . Physical activity:    Days per week: Not on file    Minutes per session: Not on file  . Stress: Not on file  Relationships  . Social connections:    Talks on phone: Not on file    Gets together: Not on file    Attends religious service: Not on file    Active member of club or organization: Not on file    Attends meetings of clubs or organizations: Not on file    Relationship status: Not on file  . Intimate partner violence:    Fear of current or ex partner: Not on file    Emotionally abused: Not on file    Physically abused: Not on file    Forced sexual activity: Not on file  Other Topics Concern  .  Not on file  Social History Narrative   Lives at home with his wife and granddaughter.   Left-handed.   3 cups caffeine per day.    Past Surgical History:  Procedure Laterality Date  . APPENDECTOMY  AGE EARLY 20'S  . HYDROCELE EXCISION  03/31/2012   Procedure: HYDROCELECTOMY ADULT;  Surgeon: Franchot Gallo, MD;  Location: [facility];  Service: Urology;  Laterality: Left;  45 MINS    . LEFT EYE LASER RETINA REPAIR  SEPT 2012  . RIGHT EYE VITRECTOMY/ INSERTION GLAUCOMA SETON/ LASER REPAIR  12-13-2008   RETINAL ARTERY OCCLUSION /NEOVASCULAR GLAUCOMA/ HEMORRHAGE  . RIGHT EYE VITRETOMY/ INSERTION GLAUCOMA SETON X2/ LASER  03-24-2009   RECURRENT HEMORRHAGE/ OCCLUSION INTERNAL SETON  . SHOULDER ARTHROSCOPY  Right 2005  . undescended right testicle removed  1994    Family History  Problem Relation Age of Onset  . Glaucoma Mother   . Diabetes Mother   . Stomach cancer Maternal Grandfather   . Stroke Maternal Grandmother     Allergies  Allergen Reactions  . Lactose Intolerance (Gi) Diarrhea  . Apple Pectin [Pectin] Itching    ITCHY THROAT  . Peach [Prunus Persica] Itching    ITCHY THROAT  . Morphine And Related Anxiety    Jittery    Current Outpatient Medications on File Prior to Visit  Medication Sig Dispense Refill  . albuterol (PROVENTIL HFA;VENTOLIN HFA) 108 (90 Base) MCG/ACT inhaler 2 puffs as needed every 4-6 hours    . amLODipine (NORVASC) 10 MG tablet TAKE 1 TABLET EVERY DAY 90 tablet 1  . aspirin 325 MG tablet Take 1 tablet (325 mg total) by mouth daily. 30 tablet 0  . atorvastatin (LIPITOR) 10 MG tablet TAKE 1 TABLET (10 MG TOTAL) BY MOUTH DAILY AT 6 PM. 90 tablet 2  . blood glucose meter kit and supplies Dispense based on Insurance. Test blood sugars once daily. Dx: E10.9 1 each 0  . cyclobenzaprine (FLEXERIL) 10 MG tablet Take 1 tablet (10 mg total) by mouth at bedtime. 30 tablet 0  . furosemide (LASIX) 20 MG tablet TAKE 1 TABLET (20 MG TOTAL) EVERY OTHER DAY BY MOUTH. 90 tablet 1  . glucose blood (FREESTYLE LITE) test strip Test once daily dx E10.9 100 each 3  . Insulin Glargine (LANTUS SOLOSTAR) 100 UNIT/ML Solostar Pen INJECT 23 UNITS SUBCUTANEOUSLY AT BEDTIME (Patient taking differently: 24 Units. INJECT 24 UNITS SUBCUTANEOUSLY AT BEDTIME) 15 pen 1  . Insulin Pen Needle (B-D ULTRAFINE III SHORT PEN) 31G X 8 MM MISC USE WITH LANUTS PEN 100 each 3  . losartan-hydrochlorothiazide (HYZAAR) 100-12.5 MG tablet Take 1 tablet by mouth daily.    . metFORMIN (GLUCOPHAGE) 1000 MG tablet TAKE 1 TABLET (1,000 MG TOTAL) 2 (TWO) TIMES DAILY WITH A MEAL BY MOUTH. 180 tablet 0  . metoprolol tartrate (LOPRESSOR) 50 MG tablet Take 1 tablet (50 mg total) by mouth 2 (two) times daily. 180  tablet 2  . mometasone-formoterol (DULERA) 100-5 MCG/ACT AERO Inhale 2 puffs into the lungs 2 (two) times daily.    . montelukast (SINGULAIR) 10 MG tablet Take 10 mg by mouth at bedtime.    Glory Rosebush DELICA LANCETS 32R MISC TEST BLOOD SUGAR ONCE DAILY 100 each 0  . tiZANidine (ZANAFLEX) 2 MG tablet Take 1 tablet (2 mg total) by mouth at bedtime. 90 tablet 1   No current facility-administered medications on file prior to visit.     There were no vitals taken for this visit.  Objective:   Physical Exam  Constitutional: He is oriented to person, place, and time. He appears well-developed and well-nourished. No distress.  Cardiovascular: Normal rate, regular rhythm, normal heart sounds and intact distal pulses. Exam reveals no gallop and no friction rub.  No murmur heard. Pulmonary/Chest: Effort normal and breath sounds normal. No stridor. No respiratory distress. He has no wheezes. He has no rales. He exhibits no tenderness.  Neurological: He is alert and oriented to person, place, and time. He exhibits abnormal muscle tone. Coordination abnormal.  Skin: Skin is warm and dry. He is not diaphoretic.  Psychiatric: He has a normal mood and affect. His behavior is normal. Judgment and thought content normal.  Nursing note and vitals reviewed.     Assessment & Plan:  1. Type 2 diabetes mellitus with diabetic neuropathy, with long-term current use of insulin (HCC)  - POCT A1C - 7.3. No improvement  - Advised to take every night  - Will decrease dose slightly to 20 units since he was not taking it every night  - Follow up for signs of hypoglycemia  - Follow up in 3 months for recheck   [name], NP

## 2018-01-24 NOTE — Patient Instructions (Signed)
Since you have not been taking your insulin every night. Please drop dose to 20 units and take every night.   If you notice your having symptoms of low blood sugar, please let me know right away    Follow up in 3 months

## 2018-02-16 ENCOUNTER — Other Ambulatory Visit: Payer: Self-pay | Admitting: Adult Health

## 2018-02-16 DIAGNOSIS — F339 Major depressive disorder, recurrent, unspecified: Secondary | ICD-10-CM

## 2018-02-16 DIAGNOSIS — Z23 Encounter for immunization: Secondary | ICD-10-CM

## 2018-02-16 DIAGNOSIS — G8191 Hemiplegia, unspecified affecting right dominant side: Secondary | ICD-10-CM

## 2018-02-18 DIAGNOSIS — H40012 Open angle with borderline findings, low risk, left eye: Secondary | ICD-10-CM | POA: Diagnosis not present

## 2018-02-18 DIAGNOSIS — H4051X3 Glaucoma secondary to other eye disorders, right eye, severe stage: Secondary | ICD-10-CM | POA: Diagnosis not present

## 2018-02-18 NOTE — Telephone Encounter (Signed)
Sent to the pharmacy by e-scribe. 

## 2018-02-20 ENCOUNTER — Other Ambulatory Visit: Payer: Self-pay | Admitting: Internal Medicine

## 2018-02-20 NOTE — Telephone Encounter (Signed)
Cory Pt.

## 2018-02-20 NOTE — Telephone Encounter (Signed)
Sent to the pharmacy by e-scribe. 

## 2018-03-20 ENCOUNTER — Encounter: Payer: Self-pay | Admitting: Sports Medicine

## 2018-03-20 ENCOUNTER — Ambulatory Visit (INDEPENDENT_AMBULATORY_CARE_PROVIDER_SITE_OTHER): Payer: Medicare Other | Admitting: Sports Medicine

## 2018-03-20 VITALS — BP 158/80 | HR 63 | Ht 67.0 in | Wt 179.8 lb

## 2018-03-20 DIAGNOSIS — M47812 Spondylosis without myelopathy or radiculopathy, cervical region: Secondary | ICD-10-CM | POA: Diagnosis not present

## 2018-03-20 DIAGNOSIS — M25512 Pain in left shoulder: Secondary | ICD-10-CM | POA: Diagnosis not present

## 2018-03-20 DIAGNOSIS — I6302 Cerebral infarction due to thrombosis of basilar artery: Secondary | ICD-10-CM

## 2018-03-20 DIAGNOSIS — M19012 Primary osteoarthritis, left shoulder: Secondary | ICD-10-CM | POA: Insufficient documentation

## 2018-03-20 DIAGNOSIS — G8191 Hemiplegia, unspecified affecting right dominant side: Secondary | ICD-10-CM

## 2018-03-20 MED ORDER — TIZANIDINE HCL 2 MG PO TABS: 2.0000 mg | ORAL_TABLET | Freq: Every day | ORAL | 1 refills | Status: DC

## 2018-03-20 NOTE — Progress Notes (Signed)
Russell Austin. Russell Austin, [place] at [place]  Russell Austin - 67 y.o. male MRN [id number]  Date of birth: 04/24/1951  Visit Date: 03/20/2018  PCP: [name], NP   Referred by: [name], NP  Scribe(s) for today's visit: Josepha Pigg, CMA  SUBJECTIVE:  Russell Austin is here for Follow-up (L shoulder pain)   12/19/2017: His L shoulder pain symptoms INITIALLY: Began after a fall a few months ago.  Described as moderate sharp, radiating to tricep Worsened with movement, overhead reaching, reaching back and down. Pain is anterior.  Improved with rest but still has pain.  Additional associated symptoms include: denies n/t. Reports decreased ROM and grip strength.    At this time symptoms are worsening compared to onset.  He has been prescribed Flexeril to try for the pain but just picked it up yesterday.   03/20/2018: Compared to the last office visit, his previously described symptoms are improving, pain is not as severe and ROM has improved. He is still having trouble reaching over his head. He has noticed decreased grip strength. He denies n/t in his L hand. He does have pain at night when sleeping on his L side and wakes with pain in the morning. He denies clicking or popping in the L shoulder. He denies neck pain.  Current symptoms are mild & are radiating to the L arm. Pain is moderate-severe when reaching overhead.  He has tried taking Tizanidine with some relief but he is out.   He received steroid injection 12/19/17 with good relief.    REVIEW OF SYSTEMS: Reports night time disturbances. Denies fevers, chills, or night sweats. Denies unexplained weight loss. Denies personal history of cancer. Reports changes in bowel or bladder habits - constipation. Reports recent unreported falls - fell last Sunday on his buttock after loosing balance, no injuries. Denies new or worsening dyspnea or  wheezing. Denies headaches or dizziness.  Denies numbness, tingling or weakness  In the extremities.  Denies dizziness or presyncopal episodes Denies lower extremity edema    HISTORY & PERTINENT PRIOR DATA:  Prior History reviewed and updated per electronic medical record.  Significant/pertinent history, findings, studies include:  reports that he has never smoked. He has never used smokeless tobacco. Recent Labs    07/10/17 0722 10/10/17 0752 01/24/18 0705  HGBA1C 7.5 7.3* 7.3*   [facility]  Primary Osteoarthritis of Left Shoulder  Spondylosis of Cervical Region Without Myelopathy Or Radiculopathy    OBJECTIVE:  VS:  HT:5\' 7"  (170.2 cm)   WT:179 lb 12.8 oz (81.6 kg)  BMI:28.15    BP:(Abnormal) 158/80  HR:63bpm  TEMP: ( )  RESP:98 %   PHYSICAL EXAM: Constitutional: WDWN, Non-toxic appearing. Psychiatric: Alert & appropriately interactive.  Not depressed or anxious appearing. Respiratory: No increased work of breathing.  Trachea Midline  Vascular Exam: warm to touch no edema  upper extremity neuro exam: unremarkable  MSK Exam: Left shoulder is held in slight protraction with limited overhead range of motion by approximately 40 degrees compared to the right.  He has crepitation with axial load and circumduction.  Pain with external rotation.  His intrinsic rotator cuff strength however is intact.  Slight pain with palpation of the [facility] joint and anterior shoulder.   ASSESSMENT & PLAN:   1. Primary osteoarthritis of left shoulder   2. Acute pain of left shoulder   3. Right hemiparesis ([place])  4. Spondylosis of 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 myelopathy or radiculopathy     PLAN: He has fairly significant osteoarthritis based on x-ray and physical exam.  He responded well to the injection is only having intermittent symptoms at this time.  He has any worsening or consistent pain throughout the day consider repeating injection at times  recommended.  He will plan to follow-up only as needed.  Follow-up with his Zanaflex today.  If persistent ongoing issues or any new radicular symptoms can consider further work-up of the cervical spine that does show significant degenerative changes based on the swallow study that he had previously.  Follow-up: Return if symptoms worsen or fail to improve, for as needed for ongoing issues.     Please see additional documentation for Objective, Assessment and Plan sections. Pertinent additional documentation may be included in corresponding procedure notes, imaging studies, problem based documentation and patient instructions. Please see these sections of the encounter for additional information regarding this visit.  CMA/ATC served as Education administrator during this visit. History, Physical, and Plan performed by medical provider. Documentation and orders reviewed and attested to.      Gerda Diss, [place] [facility] Physician

## 2018-03-25 ENCOUNTER — Other Ambulatory Visit: Payer: Self-pay | Admitting: Adult Health

## 2018-03-25 DIAGNOSIS — I10 Essential (primary) hypertension: Secondary | ICD-10-CM

## 2018-03-25 NOTE — Telephone Encounter (Signed)
Sent to the pharmacy by e-scribe. 

## 2018-04-29 ENCOUNTER — Encounter: Payer: Self-pay | Admitting: Adult Health

## 2018-04-29 ENCOUNTER — Ambulatory Visit (INDEPENDENT_AMBULATORY_CARE_PROVIDER_SITE_OTHER): Payer: Medicare Other | Admitting: Adult Health

## 2018-04-29 VITALS — BP 140/70 | Temp 98.2°F | Wt 184.0 lb

## 2018-04-29 DIAGNOSIS — I6302 Cerebral infarction due to thrombosis of basilar artery: Secondary | ICD-10-CM

## 2018-04-29 DIAGNOSIS — E1140 Type 2 diabetes mellitus with diabetic neuropathy, unspecified: Secondary | ICD-10-CM | POA: Diagnosis not present

## 2018-04-29 DIAGNOSIS — Z794 Long term (current) use of insulin: Secondary | ICD-10-CM

## 2018-04-29 LAB — POCT GLYCOSYLATED HEMOGLOBIN (HGB A1C): HbA1c, POC (controlled diabetic range): 7.2 % — AB (ref 0.0–7.0)

## 2018-04-29 NOTE — Progress Notes (Signed)
Subjective:    Patient ID: Russell Austin, male    DOB: September 07, 1950, 67 y.o.   MRN: [id number]  HPI  67 year old male male who  has a past medical history of Blindness, legal (RIGHT EYE SECONDARY TO ACUTE GLAUCOMA), Diabetes mellitus type II, Diabetic retinopathy (FOLLOWED BY DR Zadie Rhine), ED (erectile dysfunction), Glaucoma of both eyes, Hypertension, Left hydrocele, and Stroke ([place]). He presents to the office today for three month follow up regarding diabetes.   He is currently prescribed metformin 1000 mg twice daily and insulin Lantus 20 units nightly.  During his last visit he reported that he was not taking Lantus every night.  His last A1c was 7.3 there was no change from previous A1c and he was advised to continue to work on lifestyle vacations and make sure he takes his medication as directed, daily.  Today in the office he reports that he is still not taking his insulin on a nightly basis. He may remember to take it 4 x/week.   His diet has been suffering and he has been eating a lot of carbs and sweets. Weight is up 9 pounds over the last three months   He denies any hypoglycemic events.   Wt Readings from Last 3 Encounters:  04/29/18 184 lb (83.5 kg)  03/20/18 179 lb 12.8 oz (81.6 kg)  01/24/18 175 lb (79.4 kg)    Review of Systems See HPI   Past Medical History:  Diagnosis Date  . 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 Diabetes mellitus type II   . Diabetic retinopathy FOLLOWED BY DR Zadie Rhine  . ED (erectile dysfunction)   . Glaucoma of both eyes   . Hypertension   . Left hydrocele   . Stroke [facility])     Social History   Socioeconomic History  . Marital status: Married    Spouse name: Not on file  . Number of children: 1  . Years of education: 8  . Highest education level: Not on file  Occupational History  . Occupation: Disabled  Social Needs  . Financial resource strain: Not on file  . Food insecurity:    Worry: Not on file   Inability: Not on file  . Transportation needs:    Medical: Not on file    Non-medical: Not on file  Tobacco Use  . Smoking status: Never Smoker  . Smokeless tobacco: Never Used  Substance and Sexual Activity  . Alcohol use: No  . Drug use: No  . Sexual activity: Not on file  Lifestyle  . Physical activity:    Days per week: Not on file    Minutes per session: Not on file  . Stress: Not on file  Relationships  . Social connections:    Talks on phone: Not on file    Gets together: Not on file    Attends religious service: Not on file    Active member of club or organization: Not on file    Attends meetings of clubs or organizations: Not on file    Relationship status: Not on file  . Intimate partner violence:    Fear of current or ex partner: Not on file    Emotionally abused: Not on file    Physically abused: Not on file    Forced sexual activity: Not on file  Other Topics Concern  . Not on file  Social History Narrative   Lives at home with his wife and granddaughter.   Left-handed.  3 cups caffeine per day.    Past Surgical History:  Procedure Laterality Date  . APPENDECTOMY  AGE EARLY 20'S  . HYDROCELE EXCISION  03/31/2012   Procedure: HYDROCELECTOMY ADULT;  Surgeon: Franchot Gallo, MD;  Location: [facility];  Service: Urology;  Laterality: Left;  45 MINS    . LEFT EYE LASER RETINA REPAIR  SEPT 2012  . RIGHT EYE VITRECTOMY/ INSERTION GLAUCOMA SETON/ LASER REPAIR  12-13-2008   RETINAL ARTERY OCCLUSION /NEOVASCULAR GLAUCOMA/ HEMORRHAGE  . RIGHT EYE VITRETOMY/ INSERTION GLAUCOMA SETON X2/ LASER  03-24-2009   RECURRENT HEMORRHAGE/ OCCLUSION INTERNAL SETON  . SHOULDER ARTHROSCOPY Right 2005  . undescended right testicle removed  1994    Family History  Problem Relation Age of Onset  . Glaucoma Mother   . Diabetes Mother   . Stomach cancer Maternal Grandfather   . Stroke Maternal Grandmother     Allergies  Allergen Reactions  . Lactose  Intolerance (Gi) Diarrhea  . Apple Pectin [Pectin] Itching    ITCHY THROAT  . Peach [Prunus Persica] Itching    ITCHY THROAT  . Morphine And Related Anxiety    Jittery    Current Outpatient Medications on File Prior to Visit  Medication Sig Dispense Refill  . albuterol (PROVENTIL HFA;VENTOLIN HFA) 108 (90 Base) MCG/ACT inhaler 2 puffs as needed every 4-6 hours    . amLODipine (NORVASC) 10 MG tablet TAKE 1 TABLET EVERY DAY 90 tablet 1  . aspirin 325 MG tablet Take 1 tablet (325 mg total) by mouth daily. 30 tablet 0  . atorvastatin (LIPITOR) 10 MG tablet TAKE 1 TABLET (10 MG TOTAL) BY MOUTH DAILY AT 6 PM. 90 tablet 2  . blood glucose meter kit and supplies Dispense based on Insurance. Test blood sugars once daily. Dx: E10.9 1 each 0  . furosemide (LASIX) 20 MG tablet TAKE 1 TABLET (20 MG TOTAL) EVERY OTHER DAY BY MOUTH. 90 tablet 1  . glucose blood (FREESTYLE LITE) test strip Test once daily dx E10.9 100 each 3  . Insulin Glargine (LANTUS SOLOSTAR) 100 UNIT/ML Solostar Pen INJECT 23 UNITS SUBCUTANEOUSLY AT BEDTIME (Patient taking differently: Inject 22 Units into the skin daily at 10 pm. INJECT 20 UNITS SUBCUTANEOUSLY AT BEDTIME) 15 pen 1  . Insulin Pen Needle (B-D ULTRAFINE III SHORT PEN) 31G X 8 MM MISC USE WITH LANUTS PEN 100 each 3  . losartan-hydrochlorothiazide (HYZAAR) 100-12.5 MG tablet TAKE 1 TABLET BY MOUTH EVERY DAY 90 tablet 2  . metFORMIN (GLUCOPHAGE) 1000 MG tablet TAKE 1 TABLET (1,000 MG TOTAL) 2 (TWO) TIMES DAILY WITH A MEAL BY MOUTH. 180 tablet 0  . metoprolol tartrate (LOPRESSOR) 50 MG tablet Take 1 tablet (50 mg total) by mouth 2 (two) times daily. 180 tablet 2  . mometasone-formoterol (DULERA) 100-5 MCG/ACT AERO Inhale 2 puffs into the lungs 2 (two) times daily.    . montelukast (SINGULAIR) 10 MG tablet Take 10 mg by mouth at bedtime.    Glory Rosebush DELICA LANCETS 17O MISC TEST BLOOD SUGAR ONCE DAILY 100 each 0  . tiZANidine (ZANAFLEX) 2 MG tablet Take 1 tablet (2 mg total)  by mouth at bedtime. 90 tablet 1   No current facility-administered medications on file prior to visit.     BP 140/70   Temp 98.2 F (36.8 C) (Oral)   Wt 184 lb (83.5 kg)   BMI 28.82 kg/m       Objective:   Physical Exam  Constitutional: He is oriented to person, place,  and time. He appears well-developed and well-nourished. No distress.  Cardiovascular: Normal rate, regular rhythm, normal heart sounds and intact distal pulses.  Pulmonary/Chest: Effort normal and breath sounds normal.  Musculoskeletal: He exhibits edema (bilateral lowewr extremity ).  Neurological: He is alert and oriented to person, place, and time.  Skin: Skin is warm and dry. He is not diaphoretic.  Psychiatric: He has a normal mood and affect. His behavior is normal. Judgment and thought content normal.  Nursing note and vitals reviewed.     Assessment & Plan:  1. Type 2 diabetes mellitus with diabetic neuropathy, with long-term current use of insulin (HCC)  - POCT A1C - 7.2 - has improved  - Encouraged life style modifications  - walk to stop sign ( about 1/4 mile) twice a day  - Drink more water  - Put note next to tooth brush to remind him to take Lantus  - Follow up in three months or sooner if needed  [name], NP

## 2018-05-07 DIAGNOSIS — H3582 Retinal ischemia: Secondary | ICD-10-CM | POA: Diagnosis not present

## 2018-05-07 DIAGNOSIS — H31012 Macula scars of posterior pole (postinflammatory) (post-traumatic), left eye: Secondary | ICD-10-CM | POA: Diagnosis not present

## 2018-05-07 DIAGNOSIS — E113312 Type 2 diabetes mellitus with moderate nonproliferative diabetic retinopathy with macular edema, left eye: Secondary | ICD-10-CM | POA: Diagnosis not present

## 2018-05-21 ENCOUNTER — Other Ambulatory Visit: Payer: Self-pay | Admitting: Internal Medicine

## 2018-05-21 DIAGNOSIS — E113312 Type 2 diabetes mellitus with moderate nonproliferative diabetic retinopathy with macular edema, left eye: Secondary | ICD-10-CM | POA: Diagnosis not present

## 2018-06-02 ENCOUNTER — Other Ambulatory Visit: Payer: Self-pay | Admitting: Adult Health

## 2018-06-02 DIAGNOSIS — E1140 Type 2 diabetes mellitus with diabetic neuropathy, unspecified: Secondary | ICD-10-CM

## 2018-06-02 DIAGNOSIS — Z794 Long term (current) use of insulin: Principal | ICD-10-CM

## 2018-06-03 NOTE — Telephone Encounter (Signed)
Sent to the pharmacy by e-scribe. 

## 2018-06-04 ENCOUNTER — Telehealth: Payer: Self-pay | Admitting: Adult Health

## 2018-06-04 MED ORDER — BASAGLAR KWIKPEN 100 UNIT/ML ~~LOC~~ SOPN: 20.0000 [IU] | PEN_INJECTOR | Freq: Every day | SUBCUTANEOUS | 0 refills | Status: DC

## 2018-06-04 NOTE — Telephone Encounter (Signed)
Ok to switch to basaglar 20 units QHS

## 2018-06-04 NOTE — Telephone Encounter (Signed)
Copied from [place] [id number]. Topic: Quick Communication - See Telephone Encounter >> Jun 04, 2018  9:54 AM Alfredia Ferguson R wrote: Holland Falling is calling in stating the Lantus Solostar Pen requires pre authorization and want to know if another option would like to be tried that doesn't require pre authorization    Tyler Aas , Engineer, agricultural , Levemir do not require pre authorization is agreed a [facility]  [id number] [address], [place].  [phone] (Phone) [phone] (Fax)  If Dr Carlisle Cater agrees to do pre authorization he can call [employer] [phone]

## 2018-06-04 NOTE — Telephone Encounter (Signed)
New Rx sent to the pharmacy.  Lantus was d/c.

## 2018-06-10 ENCOUNTER — Encounter: Payer: Self-pay | Admitting: Adult Health

## 2018-06-10 ENCOUNTER — Ambulatory Visit (INDEPENDENT_AMBULATORY_CARE_PROVIDER_SITE_OTHER): Payer: Medicare Other | Admitting: Adult Health

## 2018-06-10 VITALS — BP 142/62 | HR 73 | Temp 97.5°F | Wt 185.7 lb

## 2018-06-10 DIAGNOSIS — J069 Acute upper respiratory infection, unspecified: Secondary | ICD-10-CM

## 2018-06-10 DIAGNOSIS — I6302 Cerebral infarction due to thrombosis of basilar artery: Secondary | ICD-10-CM | POA: Diagnosis not present

## 2018-06-10 MED ORDER — DOXYCYCLINE HYCLATE 100 MG PO CAPS: 100.0000 mg | ORAL_CAPSULE | Freq: Two times a day (BID) | ORAL | 0 refills | Status: DC

## 2018-06-10 MED ORDER — BENZONATATE 100 MG PO CAPS: 100.0000 mg | ORAL_CAPSULE | Freq: Two times a day (BID) | ORAL | 0 refills | Status: DC | PRN

## 2018-06-10 NOTE — Progress Notes (Signed)
Subjective:    Patient ID: Russell Austin, male    DOB: 01/21/1951, 67 y.o.   MRN: [id number]  Cough  This is a new problem. The current episode started 1 to 4 weeks ago (10 days ). The cough is productive of sputum. Associated symptoms include headaches and shortness of breath. Pertinent negatives include no chills, fever, nasal congestion, postnasal drip, rhinorrhea or wheezing. The symptoms are aggravated by other. He has tried OTC cough suppressant for the symptoms. The treatment provided no relief. There is no history of asthma, bronchitis, COPD, emphysema or environmental allergies.    Review of Systems  Constitutional: Positive for activity change and fatigue. Negative for appetite change, chills and fever.  HENT: Negative for postnasal drip and rhinorrhea.   Respiratory: Positive for cough, chest tightness and shortness of breath. Negative for wheezing.   Cardiovascular: Negative.   Allergic/Immunologic: Negative for environmental allergies.  Neurological: Positive for headaches.  Hematological: Negative.    Past Medical History:  Diagnosis Date  . 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 Diabetes mellitus type II   . Diabetic retinopathy FOLLOWED BY DR Zadie Rhine  . ED (erectile dysfunction)   . Glaucoma of both eyes   . Hypertension   . Left hydrocele   . Stroke [facility])     Social History   Socioeconomic History  . Marital status: Married    Spouse name: Not on file  . Number of children: 1  . Years of education: 30  . Highest education level: Not on file  Occupational History  . Occupation: Disabled  Social Needs  . Financial resource strain: Not on file  . Food insecurity:    Worry: Not on file    Inability: Not on file  . Transportation needs:    Medical: Not on file    Non-medical: Not on file  Tobacco Use  . Smoking status: Never Smoker  . Smokeless tobacco: Never Used  Substance and Sexual Activity  . Alcohol use: No  . Drug use:  No  . Sexual activity: Not on file  Lifestyle  . Physical activity:    Days per week: Not on file    Minutes per session: Not on file  . Stress: Not on file  Relationships  . Social connections:    Talks on phone: Not on file    Gets together: Not on file    Attends religious service: Not on file    Active member of club or organization: Not on file    Attends meetings of clubs or organizations: Not on file    Relationship status: Not on file  . Intimate partner violence:    Fear of current or ex partner: Not on file    Emotionally abused: Not on file    Physically abused: Not on file    Forced sexual activity: Not on file  Other Topics Concern  . Not on file  Social History Narrative   Lives at home with his wife and granddaughter.   Left-handed.   3 cups caffeine per day.    Past Surgical History:  Procedure Laterality Date  . APPENDECTOMY  AGE EARLY 20'S  . HYDROCELE EXCISION  03/31/2012   Procedure: HYDROCELECTOMY ADULT;  Surgeon: Franchot Gallo, MD;  Location: [facility];  Service: Urology;  Laterality: Left;  45 MINS    . LEFT EYE LASER RETINA REPAIR  SEPT 2012  . RIGHT EYE VITRECTOMY/ INSERTION GLAUCOMA SETON/ LASER REPAIR  12-13-2008  RETINAL ARTERY OCCLUSION /NEOVASCULAR GLAUCOMA/ HEMORRHAGE  . RIGHT EYE VITRETOMY/ INSERTION GLAUCOMA SETON X2/ LASER  03-24-2009   RECURRENT HEMORRHAGE/ OCCLUSION INTERNAL SETON  . SHOULDER ARTHROSCOPY Right 2005  . undescended right testicle removed  1994    Family History  Problem Relation Age of Onset  . Glaucoma Mother   . Diabetes Mother   . Stomach cancer Maternal Grandfather   . Stroke Maternal Grandmother     Allergies  Allergen Reactions  . Lactose Intolerance (Gi) Diarrhea  . Apple Pectin [Pectin] Itching    ITCHY THROAT  . Peach [Prunus Persica] Itching    ITCHY THROAT  . Morphine And Related Anxiety    Jittery    Current Outpatient Medications on File Prior to Visit  Medication Sig  Dispense Refill  . albuterol (PROVENTIL HFA;VENTOLIN HFA) 108 (90 Base) MCG/ACT inhaler 2 puffs as needed every 4-6 hours    . amLODipine (NORVASC) 10 MG tablet TAKE 1 TABLET EVERY DAY 90 tablet 1  . aspirin 325 MG tablet Take 1 tablet (325 mg total) by mouth daily. 30 tablet 0  . blood glucose meter kit and supplies Dispense based on Insurance. Test blood sugars once daily. Dx: E10.9 1 each 0  . furosemide (LASIX) 20 MG tablet TAKE 1 TABLET (20 MG TOTAL) EVERY OTHER DAY BY MOUTH. 90 tablet 1  . glucose blood (FREESTYLE LITE) test strip Test once daily dx E10.9 100 each 3  . Insulin Glargine (BASAGLAR KWIKPEN) 100 UNIT/ML SOPN Inject 0.2 mLs (20 Units total) into the skin daily. PLEASE DISCONTINUE THE LANTUS - THANKS 15 mL 0  . Insulin Pen Needle (B-D ULTRAFINE III SHORT PEN) 31G X 8 MM MISC USE WITH LANUTS PEN 100 each 3  . losartan-hydrochlorothiazide (HYZAAR) 100-12.5 MG tablet TAKE 1 TABLET BY MOUTH EVERY DAY 90 tablet 2  . metFORMIN (GLUCOPHAGE) 1000 MG tablet TAKE 1 TABLET (1,000 MG TOTAL) 2 (TWO) TIMES DAILY WITH A MEAL BY MOUTH. 180 tablet 0  . metoprolol tartrate (LOPRESSOR) 50 MG tablet Take 1 tablet (50 mg total) by mouth 2 (two) times daily. 180 tablet 2  . mometasone-formoterol (DULERA) 100-5 MCG/ACT AERO Inhale 2 puffs into the lungs 2 (two) times daily.    . montelukast (SINGULAIR) 10 MG tablet Take 10 mg by mouth at bedtime.    Glory Rosebush DELICA LANCETS 59R MISC TEST BLOOD SUGAR ONCE DAILY 100 each 0  . tiZANidine (ZANAFLEX) 2 MG tablet Take 1 tablet (2 mg total) by mouth at bedtime. 90 tablet 1   No current facility-administered medications on file prior to visit.     BP (!) 142/62 (BP Location: Left Arm, Patient Position: Sitting, Cuff Size: Normal)   Pulse 73   Temp (!) 97.5 F (36.4 C) (Oral)   Wt 185 lb 11.2 oz (84.2 kg)   SpO2 100%   BMI 29.08 kg/m       Objective:   Physical Exam  Constitutional: He is oriented to person, place, and time. He appears  well-developed and well-nourished. No distress.  Looks tired    HENT:  Head: Normocephalic and atraumatic.  Right Ear: External ear normal.  Left Ear: External ear normal.  Nose: Nose normal.  Mouth/Throat: Oropharynx is clear and moist. No oropharyngeal exudate.  Eyes: Conjunctivae and EOM are normal. Right eye exhibits no discharge. Left eye exhibits no discharge. No scleral icterus.  Cardiovascular: Normal rate, regular rhythm, normal heart sounds and intact distal pulses.  Pulmonary/Chest: Effort normal and breath sounds normal.  Neurological: He is alert and oriented to person, place, and time.  Skin: Skin is warm and dry. He is not diaphoretic.  Nursing note and vitals reviewed.     Assessment & Plan:   1. Upper respiratory tract infection, unspecified type - Stay hydrated and rest.  - Will treat for URI due to time frame and symptoms.  - Follow up if no improvement in the next 2-3 days  - doxycycline (VIBRAMYCIN) 100 MG capsule; Take 1 capsule (100 mg total) by mouth 2 (two) times daily.  Dispense: 14 capsule; Refill: 0 - benzonatate (TESSALON) 100 MG capsule; Take 1 capsule (100 mg total) by mouth 2 (two) times daily as needed for cough.  Dispense: 20 capsule; Refill: 0  [name], NP

## 2018-06-18 ENCOUNTER — Encounter: Payer: Self-pay | Admitting: Adult Health

## 2018-06-18 DIAGNOSIS — H3582 Retinal ischemia: Secondary | ICD-10-CM | POA: Diagnosis not present

## 2018-06-18 DIAGNOSIS — E113312 Type 2 diabetes mellitus with moderate nonproliferative diabetic retinopathy with macular edema, left eye: Secondary | ICD-10-CM | POA: Diagnosis not present

## 2018-06-18 DIAGNOSIS — H31012 Macula scars of posterior pole (postinflammatory) (post-traumatic), left eye: Secondary | ICD-10-CM | POA: Diagnosis not present

## 2018-06-18 LAB — HM DIABETES EYE EXAM

## 2018-06-30 ENCOUNTER — Telehealth: Payer: Self-pay | Admitting: Adult Health

## 2018-06-30 DIAGNOSIS — Z23 Encounter for immunization: Secondary | ICD-10-CM | POA: Diagnosis not present

## 2018-06-30 NOTE — Telephone Encounter (Signed)
Copied from [place] [id number]. Topic: Quick Communication - See Telephone Encounter >> Jun 30, 2018  1:06 PM Blase Mess A wrote: CRM for notification. See Telephone encounter for: 06/30/18.  Patient is calling regarding losartan-hydrochlorothiazide (HYZAAR) 100-12.5 MG tablet [[id number]] Patient stated that the script is now Losartan-potassium 100mg  and he wants to know when cory changed his medicine. Please advise [phone]

## 2018-07-01 ENCOUNTER — Other Ambulatory Visit: Payer: Self-pay | Admitting: Adult Health

## 2018-07-01 NOTE — Telephone Encounter (Signed)
Pt's prescription has NOT been changed.  He should be taking losartan/hctz.  He should contact the pharmacy to report the error.  CRM created.  Agent may give information to the pt.

## 2018-07-02 NOTE — Telephone Encounter (Signed)
Patient returned call and informed of message below, patient voice understanding and will call pharmacy.

## 2018-07-03 NOTE — Telephone Encounter (Signed)
Sent to the pharmacy by e-scribe. 

## 2018-07-16 ENCOUNTER — Other Ambulatory Visit: Payer: Self-pay | Admitting: Adult Health

## 2018-07-16 MED ORDER — LOSARTAN POTASSIUM-HCTZ 100-12.5 MG PO TABS: 1.0000 | ORAL_TABLET | Freq: Every day | ORAL | 1 refills | Status: DC

## 2018-07-16 NOTE — Telephone Encounter (Signed)
Copied from [place] [id number]. Topic: Quick Communication - Rx Refill/Question >> Jul 16, 2018  1:12 PM Wynetta Emery, [place] C wrote: Medication: losartan-hydrochlorothiazide (HYZAAR) 100-12.5 MG tablet   Has the patient contacted their pharmacy? No  (Agent: If no, request that the patient contact the pharmacy for the refill.) (Agent: If yes, when and what did the pharmacy advise?)  Preferred Pharmacy (with phone number or street name): [id number] [address], [place]. [phone] (Phone) [phone] (Fax)    Agent: Please be advised that RX refills may take up to 3 business days. We ask that you follow-up with your pharmacy.

## 2018-07-23 ENCOUNTER — Ambulatory Visit (INDEPENDENT_AMBULATORY_CARE_PROVIDER_SITE_OTHER): Payer: Medicare Other | Admitting: Adult Health

## 2018-07-23 ENCOUNTER — Encounter: Payer: Self-pay | Admitting: Adult Health

## 2018-07-23 VITALS — BP 148/80 | Temp 98.6°F | Wt 189.5 lb

## 2018-07-23 DIAGNOSIS — I10 Essential (primary) hypertension: Secondary | ICD-10-CM | POA: Diagnosis not present

## 2018-07-23 DIAGNOSIS — Z794 Long term (current) use of insulin: Secondary | ICD-10-CM | POA: Diagnosis not present

## 2018-07-23 DIAGNOSIS — E1140 Type 2 diabetes mellitus with diabetic neuropathy, unspecified: Secondary | ICD-10-CM

## 2018-07-23 DIAGNOSIS — I6302 Cerebral infarction due to thrombosis of basilar artery: Secondary | ICD-10-CM | POA: Diagnosis not present

## 2018-07-23 LAB — POCT GLYCOSYLATED HEMOGLOBIN (HGB A1C): HbA1c, POC (controlled diabetic range): 6.6 % (ref 0.0–7.0)

## 2018-07-23 MED ORDER — LOSARTAN POTASSIUM-HCTZ 100-25 MG PO TABS: 1.0000 | ORAL_TABLET | Freq: Every day | ORAL | 3 refills | Status: DC

## 2018-07-23 NOTE — Progress Notes (Signed)
Subjective:    Patient ID: Russell Austin, male    DOB: 1951/08/23, 67 y.o.   MRN: [id number]  HPI 67 year old male who  has a past medical history of Blindness, legal (RIGHT EYE SECONDARY TO ACUTE GLAUCOMA), Diabetes mellitus type II, Diabetic retinopathy (FOLLOWED BY DR Zadie Rhine), ED (erectile dysfunction), Glaucoma of both eyes, Hypertension, Left hydrocele, and Stroke ([place]).   He presents to the office today for 56-monthdiabetic follow-up.  During his last visit in August his A1c was 7.2, this had improved slightly from 7.3.  Reported that he was not taking Lantus on a nightly basis, he remembered to take it may be 4 times a week.  His diet has also been suffering and his weight was up over 9 pounds.  There were no medication changes since he was not taking his insulin.  He was continued on metformin 1000 mg twice a day and Lantus 20 units nightly.  Was advised to work on lifestyle modifications and put in a note on his bed stand to remind him to take his nightly Lantus.  Lab Results  Component Value Date   HGBA1C 7.2 (A) 04/29/2018   Today in the office he reports that he is is taking his insulin every night. His blood sugars have been below 150. He denies any hypoglycemic episodes. His diet has been improving.   Wt Readings from Last 3 Encounters:  07/23/18 189 lb 8 oz (86 kg)  06/10/18 185 lb 11.2 oz (84.2 kg)  04/29/18 184 lb (83.5 kg)   BP Readings from Last 3 Encounters:  07/23/18 (!) 148/80  06/10/18 (!) 142/62  04/29/18 140/70    Review of Systems See HPI   Past Medical History:  Diagnosis Date  . 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 Diabetes mellitus type II   . Diabetic retinopathy FOLLOWED BY DR RZadie Rhine . ED (erectile dysfunction)   . Glaucoma of both eyes   . Hypertension   . Left hydrocele   . Stroke ([facility]     Social History   Socioeconomic History  . Marital status: Married    Spouse name: Not on file  . Number of children: 1    . Years of education: 131 . Highest education level: Not on file  Occupational History  . Occupation: Disabled  Social Needs  . Financial resource strain: Not on file  . Food insecurity:    Worry: Not on file    Inability: Not on file  . Transportation needs:    Medical: Not on file    Non-medical: Not on file  Tobacco Use  . Smoking status: Never Smoker  . Smokeless tobacco: Never Used  Substance and Sexual Activity  . Alcohol use: No  . Drug use: No  . Sexual activity: Not on file  Lifestyle  . Physical activity:    Days per week: Not on file    Minutes per session: Not on file  . Stress: Not on file  Relationships  . Social connections:    Talks on phone: Not on file    Gets together: Not on file    Attends religious service: Not on file    Active member of club or organization: Not on file    Attends meetings of clubs or organizations: Not on file    Relationship status: Not on file  . Intimate partner violence:    Fear of current or ex partner: Not on file  Emotionally abused: Not on file    Physically abused: Not on file    Forced sexual activity: Not on file  Other Topics Concern  . Not on file  Social History Narrative   Lives at home with his wife and granddaughter.   Left-handed.   3 cups caffeine per day.    Past Surgical History:  Procedure Laterality Date  . APPENDECTOMY  AGE EARLY 20'S  . HYDROCELE EXCISION  03/31/2012   Procedure: HYDROCELECTOMY ADULT;  Surgeon: Franchot Gallo, MD;  Location: [facility];  Service: Urology;  Laterality: Left;  45 MINS    . LEFT EYE LASER RETINA REPAIR  SEPT 2012  . RIGHT EYE VITRECTOMY/ INSERTION GLAUCOMA SETON/ LASER REPAIR  12-13-2008   RETINAL ARTERY OCCLUSION /NEOVASCULAR GLAUCOMA/ HEMORRHAGE  . RIGHT EYE VITRETOMY/ INSERTION GLAUCOMA SETON X2/ LASER  03-24-2009   RECURRENT HEMORRHAGE/ OCCLUSION INTERNAL SETON  . SHOULDER ARTHROSCOPY Right 2005  . undescended right testicle removed   1994    Family History  Problem Relation Age of Onset  . Glaucoma Mother   . Diabetes Mother   . Stomach cancer Maternal Grandfather   . Stroke Maternal Grandmother     Allergies  Allergen Reactions  . Lactose Intolerance (Gi) Diarrhea  . Apple Pectin [Pectin] Itching    ITCHY THROAT  . Peach [Prunus Persica] Itching    ITCHY THROAT  . Morphine And Related Anxiety    Jittery    Current Outpatient Medications on File Prior to Visit  Medication Sig Dispense Refill  . albuterol (PROVENTIL HFA;VENTOLIN HFA) 108 (90 Base) MCG/ACT inhaler 2 puffs as needed every 4-6 hours    . amLODipine (NORVASC) 10 MG tablet TAKE 1 TABLET EVERY DAY 90 tablet 1  . aspirin 325 MG tablet Take 1 tablet (325 mg total) by mouth daily. 30 tablet 0  . benzonatate (TESSALON) 100 MG capsule Take 1 capsule (100 mg total) by mouth 2 (two) times daily as needed for cough. 20 capsule 0  . blood glucose meter kit and supplies Dispense based on Insurance. Test blood sugars once daily. Dx: E10.9 1 each 0  . doxycycline (VIBRAMYCIN) 100 MG capsule Take 1 capsule (100 mg total) by mouth 2 (two) times daily. 14 capsule 0  . furosemide (LASIX) 20 MG tablet TAKE 1 TABLET (20 MG TOTAL) EVERY OTHER DAY BY MOUTH. 90 tablet 1  . gabapentin (NEURONTIN) 300 MG capsule TAKE 1 CAPSULE (300 MG TOTAL) BY MOUTH AT BEDTIME. 90 capsule 0  . glucose blood (FREESTYLE LITE) test strip Test once daily dx E10.9 100 each 3  . Insulin Glargine (BASAGLAR KWIKPEN) 100 UNIT/ML SOPN Inject 0.2 mLs (20 Units total) into the skin daily. PLEASE DISCONTINUE THE LANTUS - THANKS 15 mL 0  . Insulin Pen Needle (B-D ULTRAFINE III SHORT PEN) 31G X 8 MM MISC USE WITH LANUTS PEN 100 each 3  . losartan-hydrochlorothiazide (HYZAAR) 100-12.5 MG tablet Take 1 tablet by mouth daily. 90 tablet 1  . metFORMIN (GLUCOPHAGE) 1000 MG tablet TAKE 1 TABLET (1,000 MG TOTAL) 2 (TWO) TIMES DAILY WITH A MEAL BY MOUTH. 180 tablet 0  . metoprolol tartrate (LOPRESSOR) 50 MG  tablet Take 1 tablet (50 mg total) by mouth 2 (two) times daily. 180 tablet 2  . mometasone-formoterol (DULERA) 100-5 MCG/ACT AERO Inhale 2 puffs into the lungs 2 (two) times daily.    . montelukast (SINGULAIR) 10 MG tablet Take 10 mg by mouth at bedtime.    Glory Rosebush DELICA LANCETS 31V  MISC TEST BLOOD SUGAR ONCE DAILY 100 each 0  . tiZANidine (ZANAFLEX) 2 MG tablet Take 1 tablet (2 mg total) by mouth at bedtime. 90 tablet 1   No current facility-administered medications on file prior to visit.     BP (!) 148/80   Temp 98.6 F (37 C)   Wt 189 lb 8 oz (86 kg)   BMI 29.68 kg/m       Objective:   Physical Exam  Constitutional: He is oriented to person, place, and time. He appears well-developed and well-nourished. No distress.  Cardiovascular: Normal rate, regular rhythm, normal heart sounds and intact distal pulses.  Pulmonary/Chest: Effort normal and breath sounds normal.  Neurological: He is alert and oriented to person, place, and time.  Skin: Skin is warm and dry. He is not diaphoretic.  Psychiatric: He has a normal mood and affect. His behavior is normal. Judgment and thought content normal.  Nursing note and vitals reviewed.     Assessment & Plan:  1. Type 2 diabetes mellitus with diabetic neuropathy, with long-term current use of insulin (HCC) - POC HgB A1c -6.6  - This is the best it has been in about 5 years.  - Continue with current medication therapy - Follow up in 3 months for CPE or sooner if needed - Continue to work on diet   2. Essential hypertension - Will increase Hyzaar to 100/25 mg due to lower extremity edema and BP not being controlled.  - losartan-hydrochlorothiazide (HYZAAR) 100-25 MG tablet; Take 1 tablet by mouth daily.  Dispense: 90 tablet; Refill: 3   [name], NP

## 2018-08-04 ENCOUNTER — Other Ambulatory Visit: Payer: Self-pay | Admitting: Adult Health

## 2018-08-04 DIAGNOSIS — Z794 Long term (current) use of insulin: Principal | ICD-10-CM

## 2018-08-04 DIAGNOSIS — E1140 Type 2 diabetes mellitus with diabetic neuropathy, unspecified: Secondary | ICD-10-CM

## 2018-08-05 NOTE — Telephone Encounter (Signed)
Sent to the pharmacy by e-scribe. 

## 2018-08-08 DIAGNOSIS — H2512 Age-related nuclear cataract, left eye: Secondary | ICD-10-CM | POA: Diagnosis not present

## 2018-08-08 DIAGNOSIS — H31012 Macula scars of posterior pole (postinflammatory) (post-traumatic), left eye: Secondary | ICD-10-CM | POA: Diagnosis not present

## 2018-08-08 DIAGNOSIS — H3582 Retinal ischemia: Secondary | ICD-10-CM | POA: Diagnosis not present

## 2018-08-08 DIAGNOSIS — E113312 Type 2 diabetes mellitus with moderate nonproliferative diabetic retinopathy with macular edema, left eye: Secondary | ICD-10-CM | POA: Diagnosis not present

## 2018-08-21 DIAGNOSIS — H40012 Open angle with borderline findings, low risk, left eye: Secondary | ICD-10-CM | POA: Diagnosis not present

## 2018-08-21 DIAGNOSIS — E083312 Diabetes mellitus due to underlying condition with moderate nonproliferative diabetic retinopathy with macular edema, left eye: Secondary | ICD-10-CM | POA: Diagnosis not present

## 2018-08-21 DIAGNOSIS — E119 Type 2 diabetes mellitus without complications: Secondary | ICD-10-CM | POA: Diagnosis not present

## 2018-08-21 DIAGNOSIS — H4051X3 Glaucoma secondary to other eye disorders, right eye, severe stage: Secondary | ICD-10-CM | POA: Diagnosis not present

## 2018-08-21 LAB — HM DIABETES EYE EXAM

## 2018-09-01 DIAGNOSIS — R05 Cough: Secondary | ICD-10-CM | POA: Diagnosis not present

## 2018-09-01 DIAGNOSIS — J209 Acute bronchitis, unspecified: Secondary | ICD-10-CM | POA: Diagnosis not present

## 2018-09-01 DIAGNOSIS — R0989 Other specified symptoms and signs involving the circulatory and respiratory systems: Secondary | ICD-10-CM | POA: Diagnosis not present

## 2018-09-12 ENCOUNTER — Encounter: Payer: Self-pay | Admitting: Family Medicine

## 2018-09-17 ENCOUNTER — Other Ambulatory Visit: Payer: Self-pay | Admitting: Adult Health

## 2018-09-17 DIAGNOSIS — I10 Essential (primary) hypertension: Secondary | ICD-10-CM

## 2018-09-17 NOTE — Telephone Encounter (Signed)
Sent to the pharmacy by e-scribe.  Pt has upcoming appt on 10/15/2018

## 2018-09-30 ENCOUNTER — Ambulatory Visit (INDEPENDENT_AMBULATORY_CARE_PROVIDER_SITE_OTHER): Payer: Medicare Other | Admitting: Adult Health

## 2018-09-30 ENCOUNTER — Encounter: Payer: Self-pay | Admitting: Adult Health

## 2018-09-30 VITALS — BP 170/90 | HR 66 | Temp 97.9°F | Wt 184.0 lb

## 2018-09-30 DIAGNOSIS — J069 Acute upper respiratory infection, unspecified: Secondary | ICD-10-CM

## 2018-09-30 MED ORDER — PREDNISONE 10 MG PO TABS: 10.0000 mg | ORAL_TABLET | Freq: Every day | ORAL | 0 refills | Status: DC

## 2018-09-30 MED ORDER — DOXYCYCLINE HYCLATE 100 MG PO CAPS: 100.0000 mg | ORAL_CAPSULE | Freq: Two times a day (BID) | ORAL | 0 refills | Status: DC

## 2018-09-30 MED ORDER — HYDROCODONE-HOMATROPINE 5-1.5 MG/5ML PO SYRP: 5.0000 mL | ORAL_SOLUTION | Freq: Three times a day (TID) | ORAL | 0 refills | Status: DC | PRN

## 2018-09-30 NOTE — Progress Notes (Signed)
Subjective:    Patient ID: Russell Austin, male    DOB: 18-Oct-1950, 68 y.o.   MRN: [id number]  Cough   68 year old male who  has a past medical history of Blindness, legal (RIGHT EYE SECONDARY TO ACUTE GLAUCOMA), Diabetes mellitus type II, Diabetic retinopathy (FOLLOWED BY DR Zadie Rhine), ED (erectile dysfunction), Glaucoma of both eyes, Hypertension, Left hydrocele, and Stroke ([place]).  He presents to the office today for 5 weeks of chronic cough. He was seen at [facility] for this issue when it first started and was given a prescription for Codeine cough syrup and tessalon pearls. He reports that this did not help. Today he reports productive cough that is keeping him up at night, shortness of breath, and wheezing.   He denies fevers, chills, sinus pain, pressure, n/v/d     Review of Systems  See HPI   Past Medical History:  Diagnosis Date  . 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 Diabetes mellitus type II   . Diabetic retinopathy FOLLOWED BY DR Zadie Rhine  . ED (erectile dysfunction)   . Glaucoma of both eyes   . Hypertension   . Left hydrocele   . Stroke [facility])     Social History   Socioeconomic History  . Marital status: Married    Spouse name: Not on file  . Number of children: 1  . Years of education: 70  . Highest education level: Not on file  Occupational History  . Occupation: Disabled  Social Needs  . Financial resource strain: Not on file  . Food insecurity:    Worry: Not on file    Inability: Not on file  . Transportation needs:    Medical: Not on file    Non-medical: Not on file  Tobacco Use  . Smoking status: Never Smoker  . Smokeless tobacco: Never Used  Substance and Sexual Activity  . Alcohol use: No  . Drug use: No  . Sexual activity: Not on file  Lifestyle  . Physical activity:    Days per week: Not on file    Minutes per session: Not on file  . Stress: Not on file  Relationships  . Social connections:   Talks on phone: Not on file    Gets together: Not on file    Attends religious service: Not on file    Active member of club or organization: Not on file    Attends meetings of clubs or organizations: Not on file    Relationship status: Not on file  . Intimate partner violence:    Fear of current or ex partner: Not on file    Emotionally abused: Not on file    Physically abused: Not on file    Forced sexual activity: Not on file  Other Topics Concern  . Not on file  Social History Narrative   Lives at home with his wife and granddaughter.   Left-handed.   3 cups caffeine per day.    Past Surgical History:  Procedure Laterality Date  . APPENDECTOMY  AGE EARLY 20'S  . HYDROCELE EXCISION  03/31/2012   Procedure: HYDROCELECTOMY ADULT;  Surgeon: Franchot Gallo, MD;  Location: [facility];  Service: Urology;  Laterality: Left;  45 MINS    . LEFT EYE LASER RETINA REPAIR  SEPT 2012  . RIGHT EYE VITRECTOMY/ INSERTION GLAUCOMA SETON/ LASER REPAIR  12-13-2008   RETINAL ARTERY OCCLUSION /NEOVASCULAR GLAUCOMA/ HEMORRHAGE  . RIGHT EYE VITRETOMY/ INSERTION GLAUCOMA  SETON X2/ LASER  03-24-2009   RECURRENT HEMORRHAGE/ OCCLUSION INTERNAL SETON  . SHOULDER ARTHROSCOPY Right 2005  . undescended right testicle removed  1994    Family History  Problem Relation Age of Onset  . Glaucoma Mother   . Diabetes Mother   . Stomach cancer Maternal Grandfather   . Stroke Maternal Grandmother     Allergies  Allergen Reactions  . Lactose Intolerance (Gi) Diarrhea  . Apple Pectin [Pectin] Itching    ITCHY THROAT  . Peach [Prunus Persica] Itching    ITCHY THROAT  . Morphine And Related Anxiety    Jittery    Current Outpatient Medications on File Prior to Visit  Medication Sig Dispense Refill  . albuterol (PROVENTIL HFA;VENTOLIN HFA) 108 (90 Base) MCG/ACT inhaler 2 puffs as needed every 4-6 hours    . amLODipine (NORVASC) 10 MG tablet TAKE 1 TABLET EVERY DAY 90 tablet 0  .  aspirin 325 MG tablet Take 1 tablet (325 mg total) by mouth daily. 30 tablet 0  . benzonatate (TESSALON) 100 MG capsule Take 1 capsule (100 mg total) by mouth 2 (two) times daily as needed for cough. 20 capsule 0  . blood glucose meter kit and supplies Dispense based on Insurance. Test blood sugars once daily. Dx: E10.9 1 each 0  . furosemide (LASIX) 20 MG tablet TAKE 1 TABLET (20 MG TOTAL) EVERY OTHER DAY BY MOUTH. 90 tablet 1  . gabapentin (NEURONTIN) 300 MG capsule TAKE 1 CAPSULE (300 MG TOTAL) BY MOUTH AT BEDTIME. 90 capsule 0  . glucose blood (FREESTYLE LITE) test strip Test once daily dx E10.9 100 each 3  . guaiFENesin-codeine 100-10 MG/5ML syrup 10 MILLILITER EVERY FOUR HOURS, AS NEEDED    . Insulin Glargine (BASAGLAR KWIKPEN) 100 UNIT/ML SOPN Inject 0.2 mLs (20 Units total) into the skin daily. PLEASE DISCONTINUE THE LANTUS - THANKS 15 mL 0  . Insulin Pen Needle (B-D ULTRAFINE III SHORT PEN) 31G X 8 MM MISC USE WITH LANUTS PEN 100 each 3  . losartan-hydrochlorothiazide (HYZAAR) 100-12.5 MG tablet Take 1 tablet by mouth daily. 90 tablet 1  . losartan-hydrochlorothiazide (HYZAAR) 100-25 MG tablet Take 1 tablet by mouth daily. 90 tablet 3  . metFORMIN (GLUCOPHAGE) 1000 MG tablet TAKE 1 TABLET (1,000 MG TOTAL) 2 (TWO) TIMES DAILY WITH A MEAL BY MOUTH. 180 tablet 0  . metoprolol tartrate (LOPRESSOR) 50 MG tablet TAKE 1 TABLET BY MOUTH TWICE A DAY 180 tablet 0  . mometasone-formoterol (DULERA) 100-5 MCG/ACT AERO Inhale 2 puffs into the lungs 2 (two) times daily.    . montelukast (SINGULAIR) 10 MG tablet Take 10 mg by mouth at bedtime.    Glory Rosebush DELICA LANCETS 41O MISC TEST BLOOD SUGAR ONCE DAILY 100 each 0  . tiZANidine (ZANAFLEX) 2 MG tablet Take 1 tablet (2 mg total) by mouth at bedtime. 90 tablet 1   No current facility-administered medications on file prior to visit.     BP (!) 170/90   Pulse 66   Temp 97.9 F (36.6 C)   Wt 184 lb (83.5 kg)   SpO2 98%   BMI 28.82 kg/m          Objective:   Physical Exam Vitals signs and nursing note reviewed.  Constitutional:      Appearance: Normal appearance.  Cardiovascular:     Rate and Rhythm: Normal rate and regular rhythm.     Pulses: Normal pulses.     Heart sounds: Normal heart sounds.  Pulmonary:  Effort: Pulmonary effort is normal.     Breath sounds: Wheezing (trace expiratory wheezing in upper lung fields ) present.  Musculoskeletal: Normal range of motion.  Skin:    General: Skin is warm and dry.     Capillary Refill: Capillary refill takes less than 2 seconds.  Neurological:     General: No focal deficit present.     Mental Status: He is alert and oriented to person, place, and time. Mental status is at baseline.  Psychiatric:        Mood and Affect: Mood normal.        Behavior: Behavior normal.        Thought Content: Thought content normal.        Judgment: Judgment normal.       Assessment & Plan:  1. Upper respiratory tract infection, unspecified type - Follow up if no improvement in the next 2-3 days.  - Advised to increase water intake while taking prednisone to help keep blood sugars down.  - doxycycline (VIBRAMYCIN) 100 MG capsule; Take 1 capsule (100 mg total) by mouth 2 (two) times daily.  Dispense: 14 capsule; Refill: 0 - predniSONE (DELTASONE) 10 MG tablet; Take 1 tablet (10 mg total) by mouth daily with breakfast for 7 days.  Dispense: 7 tablet; Refill: 0 - HYDROcodone-homatropine (HYCODAN) 5-1.5 MG/5ML syrup; Take 5 mLs by mouth every 8 (eight) hours as needed for cough.  Dispense: 120 mL; Refill: 0   [name], NP

## 2018-10-01 ENCOUNTER — Ambulatory Visit: Payer: Self-pay

## 2018-10-01 DIAGNOSIS — J069 Acute upper respiratory infection, unspecified: Secondary | ICD-10-CM

## 2018-10-01 MED ORDER — PREDNISONE 10 MG PO TABS: 10.0000 mg | ORAL_TABLET | Freq: Every day | ORAL | 0 refills | Status: AC

## 2018-10-01 NOTE — Telephone Encounter (Signed)
Medication re-sent to the pharmacy. Pt made aware. Nothing further needed.

## 2018-10-01 NOTE — Telephone Encounter (Signed)
Patient states he just called [facility] and they never did receive his predniSONE (DELTASONE) 10 MG tablet [[id number]] that was called in yesterday when he was in the office. Could that be re-sent for him? Thanks

## 2018-10-10 DIAGNOSIS — H2512 Age-related nuclear cataract, left eye: Secondary | ICD-10-CM | POA: Diagnosis not present

## 2018-10-10 DIAGNOSIS — E113512 Type 2 diabetes mellitus with proliferative diabetic retinopathy with macular edema, left eye: Secondary | ICD-10-CM | POA: Diagnosis not present

## 2018-10-10 DIAGNOSIS — H31012 Macula scars of posterior pole (postinflammatory) (post-traumatic), left eye: Secondary | ICD-10-CM | POA: Diagnosis not present

## 2018-10-10 DIAGNOSIS — H3582 Retinal ischemia: Secondary | ICD-10-CM | POA: Diagnosis not present

## 2018-10-11 ENCOUNTER — Emergency Department (HOSPITAL_COMMUNITY)
Admission: EM | Admit: 2018-10-11 | Discharge: 2018-10-11 | Disposition: A | Discharge status: Home / Self Care | Payer: Medicare Other | Attending: Emergency Medicine | Admitting: Emergency Medicine

## 2018-10-11 ENCOUNTER — Encounter (HOSPITAL_COMMUNITY): Payer: Self-pay | Admitting: Emergency Medicine

## 2018-10-11 ENCOUNTER — Emergency Department (HOSPITAL_COMMUNITY): Payer: Medicare Other

## 2018-10-11 DIAGNOSIS — E1122 Type 2 diabetes mellitus with diabetic chronic kidney disease: Secondary | ICD-10-CM | POA: Diagnosis not present

## 2018-10-11 DIAGNOSIS — Y939 Activity, unspecified: Secondary | ICD-10-CM | POA: Diagnosis not present

## 2018-10-11 DIAGNOSIS — Y999 Unspecified external cause status: Secondary | ICD-10-CM | POA: Insufficient documentation

## 2018-10-11 DIAGNOSIS — Y929 Unspecified place or not applicable: Secondary | ICD-10-CM | POA: Insufficient documentation

## 2018-10-11 DIAGNOSIS — M545 Low back pain: Secondary | ICD-10-CM | POA: Diagnosis present

## 2018-10-11 DIAGNOSIS — I129 Hypertensive chronic kidney disease with stage 1 through stage 4 chronic kidney disease, or unspecified chronic kidney disease: Secondary | ICD-10-CM | POA: Insufficient documentation

## 2018-10-11 DIAGNOSIS — R109 Unspecified abdominal pain: Secondary | ICD-10-CM | POA: Diagnosis not present

## 2018-10-11 DIAGNOSIS — S39012A Strain of muscle, fascia and tendon of lower back, initial encounter: Secondary | ICD-10-CM | POA: Insufficient documentation

## 2018-10-11 DIAGNOSIS — N183 Chronic kidney disease, stage 3 unspecified: Secondary | ICD-10-CM

## 2018-10-11 DIAGNOSIS — E1165 Type 2 diabetes mellitus with hyperglycemia: Secondary | ICD-10-CM

## 2018-10-11 DIAGNOSIS — Z79899 Other long term (current) drug therapy: Secondary | ICD-10-CM | POA: Diagnosis not present

## 2018-10-11 DIAGNOSIS — Z794 Long term (current) use of insulin: Secondary | ICD-10-CM | POA: Diagnosis not present

## 2018-10-11 DIAGNOSIS — X58XXXA Exposure to other specified factors, initial encounter: Secondary | ICD-10-CM | POA: Diagnosis not present

## 2018-10-11 LAB — COMPREHENSIVE METABOLIC PANEL
ALT: 14 U/L (ref 0–44)
AST: 16 U/L (ref 15–41)
Albumin: 3.1 g/dL — ABNORMAL LOW (ref 3.5–5.0)
Alkaline Phosphatase: 108 U/L (ref 38–126)
Anion gap: 9 (ref 5–15)
BUN: 20 mg/dL (ref 8–23)
CO2: 24 mmol/L (ref 22–32)
CREATININE: 1.54 mg/dL — AB (ref 0.61–1.24)
Calcium: 9 mg/dL (ref 8.9–10.3)
Chloride: 104 mmol/L (ref 98–111)
GFR calc Af Amer: 53 mL/min — ABNORMAL LOW (ref >60)
GFR calc non Af Amer: 46 mL/min — ABNORMAL LOW (ref >60)
Glucose, Bld: 308 mg/dL — ABNORMAL HIGH (ref 70–99)
Potassium: 5 mmol/L (ref 3.5–5.1)
Sodium: 137 mmol/L (ref 135–145)
Total Bilirubin: 0.9 mg/dL (ref 0.3–1.2)
Total Protein: 6.7 g/dL (ref 6.5–8.1)

## 2018-10-11 LAB — URINALYSIS, ROUTINE W REFLEX MICROSCOPIC
Bacteria, UA: NONE SEEN
Bilirubin Urine: NEGATIVE
Glucose, UA: >=500 mg/dL — AB
Ketones, ur: NEGATIVE mg/dL
Leukocytes, UA: NEGATIVE
Nitrite: NEGATIVE
Protein, ur: 100 mg/dL — AB
SPECIFIC GRAVITY, URINE: 1.009 (ref 1.005–1.030)
pH: 7 (ref 5.0–8.0)

## 2018-10-11 LAB — CBC WITH DIFFERENTIAL/PLATELET
Abs Immature Granulocytes: 0.01 10*3/uL (ref 0.00–0.07)
Basophils Absolute: 0 10*3/uL (ref 0.0–0.1)
Basophils Relative: 0 %
Eosinophils Absolute: 0.1 10*3/uL (ref 0.0–0.5)
Eosinophils Relative: 1 %
HCT: 35.8 % — ABNORMAL LOW (ref 39.0–52.0)
Hemoglobin: 11.4 g/dL — ABNORMAL LOW (ref 13.0–17.0)
Immature Granulocytes: 0 %
Lymphocytes Relative: 16 %
Lymphs Abs: 1 10*3/uL (ref 0.7–4.0)
MCH: 28.5 pg (ref 26.0–34.0)
MCHC: 31.8 g/dL (ref 30.0–36.0)
MCV: 89.5 fL (ref 80.0–100.0)
MONOS PCT: 6 %
Monocytes Absolute: 0.4 10*3/uL (ref 0.1–1.0)
Neutro Abs: 4.9 10*3/uL (ref 1.7–7.7)
Neutrophils Relative %: 77 %
Platelets: 309 10*3/uL (ref 150–400)
RBC: 4 MIL/uL — ABNORMAL LOW (ref 4.22–5.81)
RDW: 12.5 % (ref 11.5–15.5)
WBC: 6.5 10*3/uL (ref 4.0–10.5)
nRBC: 0 % (ref 0.0–0.2)

## 2018-10-11 LAB — LIPASE, BLOOD: Lipase: 26 U/L (ref 11–51)

## 2018-10-11 LAB — TROPONIN I: Troponin I: <0.03 ng/mL (ref <0.03)

## 2018-10-11 MED ORDER — ONDANSETRON HCL 4 MG/2ML IJ SOLN
4.0000 mg | Freq: Once | INTRAMUSCULAR | Status: AC
  Administered 2018-10-11: 4 mg via INTRAVENOUS
  Filled 2018-10-11: qty 2

## 2018-10-11 MED ORDER — FENTANYL CITRATE (PF) 100 MCG/2ML IJ SOLN
50.0000 ug | Freq: Once | INTRAMUSCULAR | Status: AC
  Administered 2018-10-11: 50 ug via INTRAVENOUS
  Filled 2018-10-11: qty 2

## 2018-10-11 MED ORDER — HYDROCODONE-ACETAMINOPHEN 5-325 MG PO TABS: 1.0000 | ORAL_TABLET | ORAL | 0 refills | Status: DC | PRN

## 2018-10-11 MED ORDER — DIAZEPAM 5 MG PO TABS: 5.0000 mg | ORAL_TABLET | Freq: Two times a day (BID) | ORAL | 0 refills | Status: DC

## 2018-10-11 NOTE — ED Notes (Signed)
Patient verbalizes understanding of discharge instructions. Opportunity for questioning and answers were provided. Armband removed by staff, pt discharged from ED.  

## 2018-10-11 NOTE — ED Provider Notes (Signed)
[facility] EMERGENCY DEPARTMENT Provider Note   CSN: [id number] Arrival date & time: 10/11/18  1050     History   Chief Complaint Chief Complaint  Patient presents with  . Back Pain    HPI Russell Austin is a 68 y.o. male.  Pt presents to the ED today with back pain.  He said it woke him up from sleep early this morning.  It was still hurting when he finally got up for good, so he decided to come in.  It does radiate into his stomach.  No cp, no sob, no n/v/d.  ? Constipation.  He did not take anything for his sx pta.     Past Medical History:  Diagnosis Date  . 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 Diabetes mellitus type II   . Diabetic retinopathy FOLLOWED BY DR Zadie Rhine  . ED (erectile dysfunction)   . Glaucoma of both eyes   . Hypertension   . Left hydrocele   . Stroke [facility])     Patient Active Problem List   Diagnosis Date Noted  . Primary osteoarthritis of left shoulder 03/20/2018  . Spondylosis of 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 myelopathy or radiculopathy 03/20/2018  . Complex regional pain syndrome I of upper limb 06/27/2015  . Dysphagia following cerebral infarction 02/15/2015  . Cerebral infarction due to thrombosis of basilar artery ([place])   . CKD stage 2 due to type 2 diabetes mellitus ([place]) 02/12/2015  . Right hemiparesis ([employer])   . Type 2 diabetes mellitus with diabetic neuropathy ([place]) 02/10/2015  . Neck pain   . Numbness and tingling 02/09/2015  . Hematuria of undiagnosed cause 10/30/2012  . Proteinuria 10/30/2012  . Left shoulder pain 07/17/2012  . Plantar fasciitis, bilateral 07/17/2012  . Peripheral neuropathy 11/06/2007  . 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 Essential hypertension 07/08/2007  . EXTRINSIC ASTHMA, UNSPECIFIED 06/25/2007    Past Surgical History:  Procedure Laterality Date  . APPENDECTOMY  AGE EARLY 20'S  . HYDROCELE EXCISION  03/31/2012   Procedure: HYDROCELECTOMY ADULT;  Surgeon: Franchot Gallo, MD;  Location: [facility];  Service: Urology;  Laterality: Left;  45 MINS    . LEFT EYE LASER RETINA REPAIR  SEPT 2012  . RIGHT EYE VITRECTOMY/ INSERTION GLAUCOMA SETON/ LASER REPAIR  12-13-2008   RETINAL ARTERY OCCLUSION /NEOVASCULAR GLAUCOMA/ HEMORRHAGE  . RIGHT EYE VITRETOMY/ INSERTION GLAUCOMA SETON X2/ LASER  03-24-2009   RECURRENT HEMORRHAGE/ OCCLUSION INTERNAL SETON  . SHOULDER ARTHROSCOPY Right 2005  . undescended right testicle removed  1994        Home Medications    Prior to Admission medications   Medication Sig Start Date End Date Taking? Authorizing Provider  albuterol (PROVENTIL HFA;VENTOLIN HFA) 108 (90 Base) MCG/ACT inhaler 2 puffs as needed every 4-6 hours    [provider]  amLODipine (NORVASC) 10 MG tablet TAKE 1 TABLET EVERY DAY 09/17/18   Nafziger, [name], NP  aspirin 325 MG tablet Take 1 tablet (325 mg total) by mouth daily. 02/14/15   Kelvin Cellar, MD  benzonatate (TESSALON) 100 MG capsule Take 1 capsule (100 mg total) by mouth 2 (two) times daily as needed for cough. 06/10/18   Nafziger, [name], NP  blood glucose meter kit and supplies Dispense based on [employer]. Test blood sugars once daily. Dx: E10.9 08/16/16   [name], NP  diazepam (VALIUM) 5 MG tablet Take 1 tablet (5 mg total) by mouth 2 (two) times daily. 10/11/18  Isla Pence, MD  doxycycline (VIBRAMYCIN) 100 MG capsule Take 1 capsule (100 mg total) by mouth 2 (two) times daily. 09/30/18   Nafziger, [name], NP  furosemide (LASIX) 20 MG tablet TAKE 1 TABLET (20 MG TOTAL) EVERY OTHER DAY BY MOUTH. 01/17/18   Nafziger, [name], NP  gabapentin (NEURONTIN) 300 MG capsule TAKE 1 CAPSULE (300 MG TOTAL) BY MOUTH AT BEDTIME. 07/03/18   Nafziger, [name], NP  glucose blood (FREESTYLE LITE) test strip Test once daily dx E10.9 12/24/17   Nafziger, [name], NP  guaiFENesin-codeine 100-10 MG/5ML syrup 10 MILLILITER EVERY FOUR HOURS, AS NEEDED 09/01/18   [provider]    HYDROcodone-acetaminophen (NORCO/VICODIN) 5-325 MG tablet Take 1 tablet by mouth every 4 (four) hours as needed. 10/11/18   Isla Pence, MD  HYDROcodone-homatropine [facility]) 5-1.5 MG/5ML syrup Take 5 mLs by mouth every 8 (eight) hours as needed for cough. 09/30/18   Nafziger, [name], NP  Insulin Glargine (BASAGLAR KWIKPEN) 100 UNIT/ML SOPN Inject 0.2 mLs (20 Units total) into the skin daily. PLEASE DISCONTINUE THE LANTUS - THANKS 06/04/18   Nafziger, [name], NP  Insulin Pen Needle (B-D ULTRAFINE III SHORT PEN) 31G X 8 MM MISC USE WITH LANUTS PEN 12/04/16   Nafziger, [name], NP  losartan-hydrochlorothiazide (HYZAAR) 100-12.5 MG tablet Take 1 tablet by mouth daily. 07/16/18   Nafziger, [name], NP  losartan-hydrochlorothiazide (HYZAAR) 100-25 MG tablet Take 1 tablet by mouth daily. 07/23/18   Nafziger, [name], NP  metFORMIN (GLUCOPHAGE) 1000 MG tablet TAKE 1 TABLET (1,000 MG TOTAL) 2 (TWO) TIMES DAILY WITH A MEAL BY MOUTH. 08/05/18   Nafziger, [name], NP  metoprolol tartrate (LOPRESSOR) 50 MG tablet TAKE 1 TABLET BY MOUTH TWICE A DAY 09/17/18   Nafziger, [name], NP  mometasone-formoterol (DULERA) 100-5 MCG/ACT AERO Inhale 2 puffs into the lungs 2 (two) times daily.    [name], Darrick Grinder, MD  montelukast (SINGULAIR) 10 MG tablet Take 10 mg by mouth at bedtime.    [name], Darrick Grinder, MD  [facility] DELICA LANCETS 56C [place] TEST BLOOD SUGAR ONCE DAILY 07/05/17   Nafziger, [name], NP  tiZANidine (ZANAFLEX) 2 MG tablet Take 1 tablet (2 mg total) by mouth at bedtime. 03/20/18   Gerda Diss, DO    Family History Family History  Problem Relation Age of Onset  . Glaucoma Mother   . Diabetes Mother   . Stomach cancer Maternal Grandfather   . Stroke Maternal Grandmother     Social History Social History   Tobacco Use  . Smoking status: Never Smoker  . Smokeless tobacco: Never Used  Substance Use Topics  . Alcohol use: No  . Drug use: No     Allergies   Lactose intolerance (gi); Apple pectin [pectin]; Peach  [prunus persica]; and Morphine and related   Review of Systems Review of Systems  Gastrointestinal: Positive for abdominal pain.  Musculoskeletal: Positive for back pain.  All other systems reviewed and are negative.    Physical Exam Updated Vital Signs BP (!) 194/86 (BP Location: Right Arm)   Pulse 80   Temp 97.9 F (36.6 C) (Oral)   Resp 18   Ht '5\' 9"'$  (1.753 m)   Wt 81.6 kg   SpO2 100%   BMI 26.58 kg/m   Physical Exam Vitals signs and nursing note reviewed.  Constitutional:      Appearance: Normal appearance.  HENT:     Head: Normocephalic and atraumatic.     Right Ear: External ear normal.     Left Ear:  External ear normal.     Nose: Nose normal.     Mouth/Throat:     Mouth: Mucous membranes are moist.  Eyes:     Comments: Right eye blind  Neck:     Musculoskeletal: Normal range of motion and neck supple.  Cardiovascular:     Rate and Rhythm: Normal rate and regular rhythm.     Pulses: Normal pulses.     Heart sounds: Normal heart sounds.  Pulmonary:     Effort: Pulmonary effort is normal.     Breath sounds: Normal breath sounds.  Abdominal:     General: Abdomen is flat.     Palpations: Abdomen is soft.  Skin:    General: Skin is warm.     Capillary Refill: Capillary refill takes less than 2 seconds.  Neurological:     General: No focal deficit present.     Mental Status: He is alert and oriented to person, place, and time.  Psychiatric:        Mood and Affect: Mood normal.        Behavior: Behavior normal.        Thought Content: Thought content normal.        Judgment: Judgment normal.      ED Treatments / Results  Labs (all labs ordered are listed, but only abnormal results are displayed) Labs Reviewed  COMPREHENSIVE METABOLIC PANEL - Abnormal; Notable for the following components:      Result Value   Glucose, Bld 308 (*)    Creatinine, Ser 1.54 (*)    Albumin 3.1 (*)    GFR calc non Af Amer 46 (*)    GFR calc Af Amer 53 (*)    All  other components within normal limits  CBC WITH DIFFERENTIAL/PLATELET - Abnormal; Notable for the following components:   RBC 4.00 (*)    Hemoglobin 11.4 (*)    HCT 35.8 (*)    All other components within normal limits  URINALYSIS, ROUTINE W REFLEX MICROSCOPIC - Abnormal; Notable for the following components:   Color, Urine STRAW (*)    Glucose, UA >=500 (*)    Hgb urine dipstick SMALL (*)    Protein, ur 100 (*)    All other components within normal limits  TROPONIN I  LIPASE, BLOOD    EKG EKG Interpretation  Date/Time:  Saturday October 11 2018 11:44:03 EST Ventricular Rate:  84 PR Interval:  140 QRS Duration: 76 QT Interval:  342 QTC Calculation: 404 R Axis:   51 Text Interpretation:  Normal sinus rhythm Normal ECG Confirmed by Isla Pence [phone]) on 10/11/2018 11:50:17 AM   Radiology Dg Abdomen Acute W/chest  Result Date: 10/11/2018 CLINICAL DATA:  68 year old male with central lumbar back pain and tenderness EXAM: DG ABDOMEN ACUTE W/ 1V CHEST COMPARISON:  None. FINDINGS: There is no evidence of dilated bowel loops or free intraperitoneal air. No radiopaque calculi or other significant radiographic abnormality is seen. Heart size and mediastinal contours are within normal limits. Both lungs are clear. Soft tissue anchor visualized overlying the right humeral head consistent with prior rotator cuff repair or bicipital tenodesis. IMPRESSION: Negative abdominal radiographs.  No acute cardiopulmonary disease. Electronically Signed   By: Jacqulynn Cadet M.D.   On: 10/11/2018 11:36    Procedures Procedures (including critical care time)  Medications Ordered in ED Medications  ondansetron (ZOFRAN) injection 4 mg (4 mg Intravenous Given 10/11/18 1145)  fentaNYL (SUBLIMAZE) injection 50 mcg (50 mcg Intravenous Given 10/11/18 1145)  Initial Impression / Assessment and Plan / ED Course  I have reviewed the triage vital signs and the nursing notes.  Pertinent labs & imaging  results that were available during my care of the patient were reviewed by me and considered in my medical decision making (see chart for details).  Pt is feeling much better.  He is stable for d/c.  Return if worse.  Final Clinical Impressions(s) / ED Diagnoses   Final diagnoses:  Strain of lumbar region, initial encounter  Poorly controlled type 2 diabetes mellitus (HCC)  CKD (chronic kidney disease) stage 3, GFR 30-59 ml/min [facility])    ED Discharge Orders         Ordered    diazepam (VALIUM) 5 MG tablet  2 times daily     10/11/18 1329    HYDROcodone-acetaminophen (NORCO/VICODIN) 5-325 MG tablet  Every 4 hours PRN     10/11/18 1329           Isla Pence, MD 10/11/18 1333

## 2018-10-11 NOTE — ED Triage Notes (Addendum)
Pt states he woke up in the middle of the night with cental lumbar back pain, tender to palpation. Denies injury. No strenuous activity. No loss of bladder or bowel function. Hypertensive at triage, did not take his morning meds.

## 2018-10-14 DIAGNOSIS — H25042 Posterior subcapsular polar age-related cataract, left eye: Secondary | ICD-10-CM | POA: Diagnosis not present

## 2018-10-14 DIAGNOSIS — H02831 Dermatochalasis of right upper eyelid: Secondary | ICD-10-CM | POA: Diagnosis not present

## 2018-10-14 DIAGNOSIS — H25012 Cortical age-related cataract, left eye: Secondary | ICD-10-CM | POA: Diagnosis not present

## 2018-10-14 DIAGNOSIS — H2512 Age-related nuclear cataract, left eye: Secondary | ICD-10-CM | POA: Diagnosis not present

## 2018-10-14 DIAGNOSIS — H18412 Arcus senilis, left eye: Secondary | ICD-10-CM | POA: Diagnosis not present

## 2018-10-15 ENCOUNTER — Ambulatory Visit (INDEPENDENT_AMBULATORY_CARE_PROVIDER_SITE_OTHER): Payer: Medicare Other | Admitting: Adult Health

## 2018-10-15 ENCOUNTER — Encounter: Payer: Self-pay | Admitting: Adult Health

## 2018-10-15 VITALS — BP 128/70 | Temp 98.0°F | Ht 67.0 in | Wt 179.0 lb

## 2018-10-15 DIAGNOSIS — R05 Cough: Secondary | ICD-10-CM

## 2018-10-15 DIAGNOSIS — G8191 Hemiplegia, unspecified affecting right dominant side: Secondary | ICD-10-CM

## 2018-10-15 DIAGNOSIS — Z794 Long term (current) use of insulin: Secondary | ICD-10-CM

## 2018-10-15 DIAGNOSIS — N400 Enlarged prostate without lower urinary tract symptoms: Secondary | ICD-10-CM

## 2018-10-15 DIAGNOSIS — E782 Mixed hyperlipidemia: Secondary | ICD-10-CM

## 2018-10-15 DIAGNOSIS — I10 Essential (primary) hypertension: Secondary | ICD-10-CM

## 2018-10-15 DIAGNOSIS — E1140 Type 2 diabetes mellitus with diabetic neuropathy, unspecified: Secondary | ICD-10-CM | POA: Diagnosis not present

## 2018-10-15 DIAGNOSIS — R058 Other specified cough: Secondary | ICD-10-CM

## 2018-10-15 LAB — COMPREHENSIVE METABOLIC PANEL
ALT: 12 U/L (ref 0–53)
AST: 14 U/L (ref 0–37)
Albumin: 3.7 g/dL (ref 3.5–5.2)
Alkaline Phosphatase: 100 U/L (ref 39–117)
BUN: 45 mg/dL — AB (ref 6–23)
CO2: 24 mEq/L (ref 19–32)
Calcium: 9.1 mg/dL (ref 8.4–10.5)
Chloride: 100 mEq/L (ref 96–112)
Creatinine, Ser: 2.08 mg/dL — ABNORMAL HIGH (ref 0.40–1.50)
GFR: 38.72 mL/min — ABNORMAL LOW (ref >60.00)
Glucose, Bld: 252 mg/dL — ABNORMAL HIGH (ref 70–99)
Potassium: 5 mEq/L (ref 3.5–5.1)
SODIUM: 135 meq/L (ref 135–145)
Total Bilirubin: 1 mg/dL (ref 0.2–1.2)
Total Protein: 6.6 g/dL (ref 6.0–8.3)

## 2018-10-15 LAB — TSH: TSH: 1.85 u[IU]/mL (ref 0.35–4.50)

## 2018-10-15 LAB — CBC WITH DIFFERENTIAL/PLATELET
Basophils Absolute: 0 10*3/uL (ref 0.0–0.1)
Basophils Relative: 0.3 % (ref 0.0–3.0)
EOS ABS: 0 10*3/uL (ref 0.0–0.7)
Eosinophils Relative: 0.3 % (ref 0.0–5.0)
HCT: 32.6 % — ABNORMAL LOW (ref 39.0–52.0)
Hemoglobin: 11 g/dL — ABNORMAL LOW (ref 13.0–17.0)
Lymphocytes Relative: 15.1 % (ref 12.0–46.0)
Lymphs Abs: 1.3 10*3/uL (ref 0.7–4.0)
MCHC: 33.6 g/dL (ref 30.0–36.0)
MCV: 89.3 fl (ref 78.0–100.0)
Monocytes Absolute: 0.8 10*3/uL (ref 0.1–1.0)
Monocytes Relative: 8.8 % (ref 3.0–12.0)
Neutro Abs: 6.6 10*3/uL (ref 1.4–7.7)
Neutrophils Relative %: 75.5 % (ref 43.0–77.0)
Platelets: 303 10*3/uL (ref 150.0–400.0)
RBC: 3.65 Mil/uL — ABNORMAL LOW (ref 4.22–5.81)
RDW: 13.4 % (ref 11.5–15.5)
WBC: 8.8 10*3/uL (ref 4.0–10.5)

## 2018-10-15 LAB — LIPID PANEL
Cholesterol: 106 mg/dL (ref 0–200)
HDL: 34.7 mg/dL — ABNORMAL LOW (ref >39.00)
LDL CALC: 49 mg/dL (ref 0–99)
NonHDL: 70.95
Total CHOL/HDL Ratio: 3
Triglycerides: 110 mg/dL (ref 0.0–149.0)
VLDL: 22 mg/dL (ref 0.0–40.0)

## 2018-10-15 LAB — PSA: PSA: 0.7 ng/mL (ref 0.10–4.00)

## 2018-10-15 LAB — HEMOGLOBIN A1C: Hgb A1c MFr Bld: 8.7 % — ABNORMAL HIGH (ref 4.6–6.5)

## 2018-10-15 MED ORDER — BENZONATATE 100 MG PO CAPS: 100.0000 mg | ORAL_CAPSULE | Freq: Two times a day (BID) | ORAL | 0 refills | Status: DC | PRN

## 2018-10-15 MED ORDER — TIZANIDINE HCL 2 MG PO TABS: 2.0000 mg | ORAL_TABLET | Freq: Every day | ORAL | 1 refills | Status: DC

## 2018-10-15 MED ORDER — GLUCOSE BLOOD VI STRP: ORAL_STRIP | 3 refills | Status: DC

## 2018-10-15 MED ORDER — BASAGLAR KWIKPEN 100 UNIT/ML ~~LOC~~ SOPN: 20.0000 [IU] | PEN_INJECTOR | Freq: Every day | SUBCUTANEOUS | 0 refills | Status: DC

## 2018-10-15 MED ORDER — INSULIN PEN NEEDLE 31G X 8 MM MISC: 3 refills | Status: DC

## 2018-10-15 NOTE — Progress Notes (Signed)
Subjective:    Patient ID: Russell Austin, male    DOB: 1951-02-23, 68 y.o.   MRN: [id number]  HPI Patient presents for yearly preventative medicine examination. Pleasant 68 year old male who  has a past medical history of Blindness, legal (RIGHT EYE SECONDARY TO ACUTE GLAUCOMA), Diabetes mellitus type II, Diabetic retinopathy (FOLLOWED BY DR Zadie Rhine), ED (erectile dysfunction), Glaucoma of both eyes, Hypertension, Left hydrocele, and Stroke ([place]).    Essential hypertension-early prescribed Norvasc 10 mg, Hyzaar 100-25 mg, metoprolol 50 mg twice daily BP Readings from Last 3 Encounters:  10/15/18 128/70  10/11/18 (!) 194/86  09/30/18 (!) 170/90    DM Type II-Metformin 1000 mg twice daily and Basaglar 20 units daily Lab Results  Component Value Date   HGBA1C 6.6 07/23/2018   Lower Extremity Edema -controlled with Lasix 20 mg every day  Hyperlipidemia - takes statin and ASA 324 mg daily Lab Results  Component Value Date   CHOL 90 10/10/2017   HDL 30.50 (L) 10/10/2017   LDLCALC 38 10/10/2017   TRIG 107.0 10/10/2017   CHOLHDL 3 10/10/2017   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 Feels as though it is improving.   All immunizations and health maintenance protocols were reviewed with the patient and needed orders were placed. Up to date on vaccinations   Appropriate screening laboratory values were ordered for the patient including screening of hyperlipidemia, renal function and hepatic function. If indicated by BPH, a PSA was ordered.  Medication reconciliation,  past medical history, social history, problem list and allergies were reviewed in detail with the patient  Goals were established with regard to weight loss, exercise, and  diet in compliance with medications  Wt Readings from Last 3 Encounters:    10/15/18 179 lb (81.2 kg)  10/11/18 180 lb (81.6 kg)  09/30/18 184 lb (83.5 kg)   End of life planning was discussed.  He is up-to-date on routine screening colonoscopies   Review of Systems  Constitutional: Negative.   HENT: Negative.   Eyes: Negative.   Respiratory: Positive for cough.   Cardiovascular: Positive for leg swelling.  Gastrointestinal: Negative.   Endocrine: Negative.   Genitourinary: Negative.   Musculoskeletal: Positive for arthralgias, back pain and myalgias.  Skin: Negative.   Allergic/Immunologic: Negative.   Neurological: Negative.   Hematological: Negative.   Psychiatric/Behavioral: Negative.   All other systems reviewed and are negative.  Past Medical History:  Diagnosis Date  . 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 Diabetes mellitus type II   . Diabetic retinopathy FOLLOWED BY DR Zadie Rhine  . ED (erectile dysfunction)   . Glaucoma of both eyes   . Hypertension   . Left hydrocele   . Stroke [facility])     Social History   Socioeconomic History  . Marital status: Married    Spouse name: Not on file  . Number of children: 1  . Years of education: 33  . Highest education level: Not on file  Occupational History  . Occupation: Disabled  Social Needs  . Financial resource strain: Not on file  . Food insecurity:    Worry: Not on file    Inability: Not on file  . Transportation needs:    Medical: Not on file    Non-medical: Not on file  Tobacco Use  .  Smoking status: Never Smoker  . Smokeless tobacco: Never Used  Substance and Sexual Activity  . Alcohol use: No  . Drug use: No  . Sexual activity: Not on file  Lifestyle  . Physical activity:    Days per week: Not on file    Minutes per session: Not on file  . Stress: Not on file  Relationships  . Social connections:    Talks on phone: Not on file    Gets together: Not on file    Attends religious service: Not on file    Active member of club or organization: Not on  file    Attends meetings of clubs or organizations: Not on file    Relationship status: Not on file  . Intimate partner violence:    Fear of current or ex partner: Not on file    Emotionally abused: Not on file    Physically abused: Not on file    Forced sexual activity: Not on file  Other Topics Concern  . Not on file  Social History Narrative   Lives at home with his wife and granddaughter.   Left-handed.   3 cups caffeine per day.    Past Surgical History:  Procedure Laterality Date  . APPENDECTOMY  AGE EARLY 20'S  . HYDROCELE EXCISION  03/31/2012   Procedure: HYDROCELECTOMY ADULT;  Surgeon: Franchot Gallo, MD;  Location: [facility];  Service: Urology;  Laterality: Left;  45 MINS    . LEFT EYE LASER RETINA REPAIR  SEPT 2012  . RIGHT EYE VITRECTOMY/ INSERTION GLAUCOMA SETON/ LASER REPAIR  12-13-2008   RETINAL ARTERY OCCLUSION /NEOVASCULAR GLAUCOMA/ HEMORRHAGE  . RIGHT EYE VITRETOMY/ INSERTION GLAUCOMA SETON X2/ LASER  03-24-2009   RECURRENT HEMORRHAGE/ OCCLUSION INTERNAL SETON  . SHOULDER ARTHROSCOPY Right 2005  . undescended right testicle removed  1994    Family History  Problem Relation Age of Onset  . Glaucoma Mother   . Diabetes Mother   . Stomach cancer Maternal Grandfather   . Stroke Maternal Grandmother     Allergies  Allergen Reactions  . Lactose Intolerance (Gi) Diarrhea  . Apple Pectin [Pectin] Itching    ITCHY THROAT  . Peach [Prunus Persica] Itching    ITCHY THROAT  . Morphine And Related Anxiety    Jittery    Current Outpatient Medications on File Prior to Visit  Medication Sig Dispense Refill  . amLODipine (NORVASC) 10 MG tablet TAKE 1 TABLET EVERY DAY 90 tablet 0  . aspirin 325 MG tablet Take 1 tablet (325 mg total) by mouth daily. 30 tablet 0  . blood glucose meter kit and supplies Dispense based on Insurance. Test blood sugars once daily. Dx: E10.9 1 each 0  . furosemide (LASIX) 20 MG tablet TAKE 1 TABLET (20 MG TOTAL)  EVERY OTHER DAY BY MOUTH. 90 tablet 1  . gabapentin (NEURONTIN) 300 MG capsule TAKE 1 CAPSULE (300 MG TOTAL) BY MOUTH AT BEDTIME. 90 capsule 0  . glucose blood (FREESTYLE LITE) test strip Test once daily dx E10.9 100 each 3  . Insulin Glargine (BASAGLAR KWIKPEN) 100 UNIT/ML SOPN Inject 0.2 mLs (20 Units total) into the skin daily. PLEASE DISCONTINUE THE LANTUS - THANKS 15 mL 0  . Insulin Pen Needle (B-D ULTRAFINE III SHORT PEN) 31G X 8 MM MISC USE WITH LANUTS PEN 100 each 3  . losartan-hydrochlorothiazide (HYZAAR) 100-25 MG tablet Take 1 tablet by mouth daily. 90 tablet 3  . metFORMIN (GLUCOPHAGE) 1000 MG tablet TAKE 1 TABLET (1,000  MG TOTAL) 2 (TWO) TIMES DAILY WITH A MEAL BY MOUTH. 180 tablet 0  . metoprolol tartrate (LOPRESSOR) 50 MG tablet TAKE 1 TABLET BY MOUTH TWICE A DAY 180 tablet 0  . mometasone-formoterol (DULERA) 100-5 MCG/ACT AERO Inhale 2 puffs into the lungs 2 (two) times daily.    Glory Rosebush DELICA LANCETS 99J MISC TEST BLOOD SUGAR ONCE DAILY 100 each 0  . tiZANidine (ZANAFLEX) 2 MG tablet Take 1 tablet (2 mg total) by mouth at bedtime. 90 tablet 1   No current facility-administered medications on file prior to visit.     BP 128/70   Temp 98 F (36.7 C)   Ht _0  (1.702 m)   Wt 179 lb (81.2 kg)   BMI 28.04 kg/m       Objective:   Physical Exam Vitals signs and nursing note reviewed.  Constitutional:      General: He is not in acute distress.    Appearance: Normal appearance. He is well-developed. He is not diaphoretic.  HENT:     Head: Normocephalic and atraumatic.     Right Ear: Tympanic membrane, ear canal and external ear normal. There is no impacted cerumen.     Left Ear: Tympanic membrane, ear canal and external ear normal. There is no impacted cerumen.     Nose: Nose normal.     Mouth/Throat:     Mouth: Mucous membranes are moist.     Pharynx: Oropharynx is clear. No oropharyngeal exudate.  Eyes:     General:        Right eye: No discharge.        Left  eye: No discharge.     Conjunctiva/sclera: Conjunctivae normal.     Pupils: Pupils are equal, round, and reactive to light.  Neck:     Musculoskeletal: Normal range of motion and neck supple. No neck rigidity or muscular tenderness.     Thyroid: No thyromegaly.     Vascular: No carotid bruit.     Trachea: No tracheal deviation.  Cardiovascular:     Rate and Rhythm: Normal rate and regular rhythm.     Heart sounds: Normal heart sounds. No murmur. No friction rub. No gallop.   Pulmonary:     Effort: Pulmonary effort is normal. No respiratory distress.     Breath sounds: Normal breath sounds. No stridor. No wheezing, rhonchi or rales.  Chest:     Chest wall: No tenderness.  Abdominal:     General: Bowel sounds are normal. There is no distension.     Palpations: Abdomen is soft. There is no mass.     Tenderness: There is no abdominal tenderness. There is no right CVA tenderness, left CVA tenderness, guarding or rebound.     Hernia: No hernia is present.  Musculoskeletal: Normal range of motion.     Right lower leg: Edema (+ 1 pitting) present.     Left lower leg: Edema (+ 1 pitting ) present.  Lymphadenopathy:     Cervical: No cervical adenopathy.  Skin:    General: Skin is warm and dry.     Capillary Refill: Capillary refill takes less than 2 seconds.     Coloration: Skin is not jaundiced or pale.     Findings: No bruising, erythema, lesion or rash.  Neurological:     General: No focal deficit present.     Mental Status: He is alert and oriented to person, place, and time. Mental status is at baseline.     Cranial  Nerves: No cranial nerve deficit.     Sensory: Sensation is intact. No sensory deficit.     Motor: Weakness and abnormal muscle tone present.     Coordination: Coordination normal.     Gait: Gait is intact. Gait normal.     Deep Tendon Reflexes: Reflexes normal.     Comments: Spasisity noted throughout right side. 3/5 grip strength in right  5/5 grip strength in left    Psychiatric:        Mood and Affect: Mood normal.        Behavior: Behavior normal.        Thought Content: Thought content normal.        Judgment: Judgment normal.        Assessment & Plan:  1. Essential hypertension - Better controlled - No change in medications  - Encouraged to be more active  - CBC with Differential/Platelet - Comprehensive metabolic panel - Hemoglobin A1c - Lipid panel - TSH  2. Right hemiparesis (HCC)  - tiZANidine (ZANAFLEX) 2 MG tablet; Take 1 tablet (2 mg total) by mouth at bedtime.  Dispense: 90 tablet; Refill: 1  3. Type 2 diabetes mellitus with diabetic neuropathy, with long-term current use of insulin (HCC) - Consider increasing basaglar  - CBC with Differential/Platelet - Comprehensive metabolic panel - Hemoglobin A1c - Lipid panel - TSH - glucose blood (FREESTYLE LITE) test strip; Test once daily dx E10.9  Dispense: 100 each; Refill: 3 - Insulin Glargine (BASAGLAR KWIKPEN) 100 UNIT/ML SOPN; Inject 0.2 mLs (20 Units total) into the skin daily. PLEASE DISCONTINUE THE LANTUS - THANKS  Dispense: 15 mL; Refill: 0 - Insulin Pen Needle (B-D ULTRAFINE III SHORT PEN) 31G X 8 MM MISC; USE WITH LANUTS PEN  Dispense: 100 each; Refill: 3  4. Benign prostatic hyperplasia without lower urinary tract symptoms  - PSA  5. Mixed hyperlipidemia - Continue with statin  - CBC with Differential/Platelet - Comprehensive metabolic panel - Hemoglobin A1c - Lipid panel - TSH  6. Post-viral cough syndrome - benzonatate (TESSALON) 100 MG capsule; Take 1 capsule (100 mg total) by mouth 2 (two) times daily as needed for cough.  Dispense: 20 capsule; Refill: 0  [name], NP

## 2018-10-17 ENCOUNTER — Other Ambulatory Visit: Payer: Self-pay | Admitting: Family Medicine

## 2018-10-17 ENCOUNTER — Other Ambulatory Visit: Payer: Self-pay | Admitting: Adult Health

## 2018-10-17 MED ORDER — BUPROPION HCL ER (SR) 150 MG PO TB12: 150.0000 mg | ORAL_TABLET | Freq: Two times a day (BID) | ORAL | 1 refills | Status: DC

## 2018-10-27 DIAGNOSIS — H2512 Age-related nuclear cataract, left eye: Secondary | ICD-10-CM | POA: Diagnosis not present

## 2018-10-28 ENCOUNTER — Emergency Department (HOSPITAL_COMMUNITY)
Admission: EM | Admit: 2018-10-28 | Discharge: 2018-10-28 | Disposition: A | Discharge status: Home / Self Care | Payer: Medicare Other | Attending: Emergency Medicine | Admitting: Emergency Medicine

## 2018-10-28 ENCOUNTER — Ambulatory Visit: Payer: Medicare Other | Admitting: Adult Health

## 2018-10-28 ENCOUNTER — Emergency Department (HOSPITAL_COMMUNITY): Payer: Medicare Other

## 2018-10-28 DIAGNOSIS — Z79899 Other long term (current) drug therapy: Secondary | ICD-10-CM | POA: Insufficient documentation

## 2018-10-28 DIAGNOSIS — N182 Chronic kidney disease, stage 2 (mild): Secondary | ICD-10-CM | POA: Insufficient documentation

## 2018-10-28 DIAGNOSIS — Z7984 Long term (current) use of oral hypoglycemic drugs: Secondary | ICD-10-CM | POA: Diagnosis not present

## 2018-10-28 DIAGNOSIS — I129 Hypertensive chronic kidney disease with stage 1 through stage 4 chronic kidney disease, or unspecified chronic kidney disease: Secondary | ICD-10-CM | POA: Diagnosis not present

## 2018-10-28 DIAGNOSIS — K458 Other specified abdominal hernia without obstruction or gangrene: Secondary | ICD-10-CM | POA: Diagnosis not present

## 2018-10-28 DIAGNOSIS — M545 Low back pain, unspecified: Secondary | ICD-10-CM

## 2018-10-28 DIAGNOSIS — E119 Type 2 diabetes mellitus without complications: Secondary | ICD-10-CM | POA: Diagnosis not present

## 2018-10-28 LAB — BASIC METABOLIC PANEL
Anion gap: 7 (ref 5–15)
BUN: 19 mg/dL (ref 8–23)
CO2: 25 mmol/L (ref 22–32)
Calcium: 9 mg/dL (ref 8.9–10.3)
Chloride: 109 mmol/L (ref 98–111)
Creatinine, Ser: 1.71 mg/dL — ABNORMAL HIGH (ref 0.61–1.24)
GFR calc Af Amer: 47 mL/min — ABNORMAL LOW (ref >60)
GFR calc non Af Amer: 41 mL/min — ABNORMAL LOW (ref >60)
Glucose, Bld: 228 mg/dL — ABNORMAL HIGH (ref 70–99)
Potassium: 4.6 mmol/L (ref 3.5–5.1)
Sodium: 141 mmol/L (ref 135–145)

## 2018-10-28 LAB — CBC
HCT: 32 % — ABNORMAL LOW (ref 39.0–52.0)
Hemoglobin: 10 g/dL — ABNORMAL LOW (ref 13.0–17.0)
MCH: 29 pg (ref 26.0–34.0)
MCHC: 31.3 g/dL (ref 30.0–36.0)
MCV: 92.8 fL (ref 80.0–100.0)
Platelets: 318 10*3/uL (ref 150–400)
RBC: 3.45 MIL/uL — ABNORMAL LOW (ref 4.22–5.81)
RDW: 13.1 % (ref 11.5–15.5)
WBC: 6.7 10*3/uL (ref 4.0–10.5)
nRBC: 0 % (ref 0.0–0.2)

## 2018-10-28 LAB — CBG MONITORING, ED: GLUCOSE-CAPILLARY: 173 mg/dL — AB (ref 70–99)

## 2018-10-28 MED ORDER — HYDROCODONE-ACETAMINOPHEN 5-325 MG PO TABS: 1.0000 | ORAL_TABLET | Freq: Four times a day (QID) | ORAL | 0 refills | Status: DC | PRN

## 2018-10-28 MED ORDER — ONDANSETRON HCL 4 MG/2ML IJ SOLN
4.0000 mg | Freq: Once | INTRAMUSCULAR | Status: AC
  Administered 2018-10-28: 4 mg via INTRAVENOUS
  Filled 2018-10-28: qty 2

## 2018-10-28 MED ORDER — PREDNISONE 10 MG PO TABS: 40.0000 mg | ORAL_TABLET | Freq: Every day | ORAL | 0 refills | Status: DC

## 2018-10-28 MED ORDER — HYDROMORPHONE HCL 1 MG/ML IJ SOLN
0.5000 mg | Freq: Once | INTRAMUSCULAR | Status: AC
  Administered 2018-10-28: 0.5 mg via INTRAVENOUS
  Filled 2018-10-28: qty 1

## 2018-10-28 NOTE — ED Notes (Signed)
Patient transported to MRI 

## 2018-10-28 NOTE — Discharge Instructions (Addendum)
Rest is much as possible take the prednisone as directed.  Take the hydrocodone as needed to help with the back pain.  Make an appointment to follow-up with your regular doctor.  Also make an appointment to follow-up with neurosurgery contact information provided above.  Return for any new or worse symptoms.

## 2018-10-28 NOTE — ED Triage Notes (Signed)
Patient sent to ED by doctor for worsening back pain - seen and treated for same (musculoskeletal) a couple weeks ago, but patient reports prescribed medications, heat/ice, OTC medications have not helped and pain worse, radiating from L lower back down L leg, making position changes and ambulating difficult. Denies incontinence or new numbness/weakness. Patient has not had any of his morning medications d/t f/u appointment with doctor this morning.

## 2018-10-28 NOTE — ED Provider Notes (Signed)
[facility] EMERGENCY DEPARTMENT Provider Note   CSN: [id number] Arrival date & time: 10/28/18  3893    History   Chief Complaint Chief Complaint  Patient presents with  . Back Pain    HPI Russell Austin is a 68 y.o. male.     Patient has been having difficulty with some back pain since about February 1.  Was seen February 8.  Pain is left low lumbar back low part area no radiation into the leg no numbness or weakness to the left leg or foot.  About 4 months prior to this patient also had trouble that took a while to resolve.  Patient has not had an work-up has not had CT is not had x-rays has not had an MRI.  Patient not experiencing any incontinence.  No history of injury.     Past Medical History:  Diagnosis Date  . 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 Diabetes mellitus type II   . Diabetic retinopathy FOLLOWED BY DR Zadie Rhine  . ED (erectile dysfunction)   . Glaucoma of both eyes   . Hypertension   . Left hydrocele   . Stroke [facility])     Patient Active Problem List   Diagnosis Date Noted  . Primary osteoarthritis of left shoulder 03/20/2018  . Spondylosis of 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 myelopathy or radiculopathy 03/20/2018  . Complex regional pain syndrome I of upper limb 06/27/2015  . Dysphagia following cerebral infarction 02/15/2015  . Cerebral infarction due to thrombosis of basilar artery ([place])   . CKD stage 2 due to type 2 diabetes mellitus ([place]) 02/12/2015  . Right hemiparesis ([place])   . Type 2 diabetes mellitus with diabetic neuropathy ([place]) 02/10/2015  . Neck pain   . Numbness and tingling 02/09/2015  . Left shoulder pain 07/17/2012  . Plantar fasciitis, bilateral 07/17/2012  . Peripheral neuropathy 11/06/2007  . 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 Essential hypertension 07/08/2007  . EXTRINSIC ASTHMA, UNSPECIFIED 06/25/2007    Past Surgical History:  Procedure Laterality Date  . APPENDECTOMY  AGE EARLY 20'S  .  HYDROCELE EXCISION  03/31/2012   Procedure: HYDROCELECTOMY ADULT;  Surgeon: Franchot Gallo, MD;  Location: [facility];  Service: Urology;  Laterality: Left;  45 MINS    . LEFT EYE LASER RETINA REPAIR  SEPT 2012  . RIGHT EYE VITRECTOMY/ INSERTION GLAUCOMA SETON/ LASER REPAIR  12-13-2008   RETINAL ARTERY OCCLUSION /NEOVASCULAR GLAUCOMA/ HEMORRHAGE  . RIGHT EYE VITRETOMY/ INSERTION GLAUCOMA SETON X2/ LASER  03-24-2009   RECURRENT HEMORRHAGE/ OCCLUSION INTERNAL SETON  . SHOULDER ARTHROSCOPY Right 2005  . undescended right testicle removed  1994        Home Medications    Prior to Admission medications   Medication Sig Start Date End Date Taking? Authorizing Provider  amLODipine (NORVASC) 10 MG tablet TAKE 1 TABLET EVERY DAY Patient taking differently: Take 10 mg by mouth daily.  09/17/18  Yes Nafziger, [name], NP  aspirin 325 MG tablet Take 1 tablet (325 mg total) by mouth daily. 02/14/15  Yes Kelvin Cellar, MD  benzonatate (TESSALON) 100 MG capsule Take 1 capsule (100 mg total) by mouth 2 (two) times daily as needed for cough. 10/15/18  Yes Nafziger, [name], NP  Besifloxacin HCl (BESIVANCE) 0.6 % SUSP Place 1 drop into the left eye 3 (three) times daily. For 2 weeks.   Yes [provider]  buPROPion (WELLBUTRIN SR) 150 MG 12 hr tablet Take 1 tablet (150 mg total)  by mouth 2 (two) times daily. 10/17/18 01/15/19 Yes Nafziger, [name], NP  DUREZOL 0.05 % EMUL Place 1 drop into the left eye See admin instructions. Instill one drop in the left eye three times daily for 1 week, then twice daily for 1 week, then one time daily for 1 week then stop. 10/14/18  Yes [provider]  furosemide (LASIX) 20 MG tablet TAKE 1 TABLET (20 MG TOTAL) EVERY OTHER DAY BY MOUTH. Patient taking differently: Take 20 mg by mouth daily.  01/17/18  Yes Nafziger, [name], NP  gabapentin (NEURONTIN) 300 MG capsule TAKE 1 CAPSULE (300 MG TOTAL) BY MOUTH AT BEDTIME. 07/03/18  Yes Nafziger, [name], NP    Insulin Glargine (BASAGLAR KWIKPEN) 100 UNIT/ML SOPN Inject 0.2 mLs (20 Units total) into the skin daily. PLEASE DISCONTINUE THE LANTUS - THANKS Patient taking differently: Inject 20 Units into the skin daily.  10/15/18  Yes Nafziger, [name], NP  losartan-hydrochlorothiazide (HYZAAR) 100-25 MG tablet Take 1 tablet by mouth daily. 07/23/18  Yes Nafziger, [name], NP  metFORMIN (GLUCOPHAGE) 1000 MG tablet TAKE 1 TABLET (1,000 MG TOTAL) 2 (TWO) TIMES DAILY WITH A MEAL BY MOUTH. 08/05/18  Yes Nafziger, [name], NP  metoprolol tartrate (LOPRESSOR) 50 MG tablet TAKE 1 TABLET BY MOUTH TWICE A DAY Patient taking differently: Take 50 mg by mouth 2 (two) times daily.  09/17/18  Yes Nafziger, [name], NP  mometasone-formoterol (DULERA) 100-5 MCG/ACT AERO Inhale 2 puffs into the lungs 2 (two) times daily.   Yes [name], Darrick Grinder, MD  PROLENSA 0.07 % SOLN Place 1 drop into the left eye at bedtime. 10/14/18  Yes [provider]  tiZANidine (ZANAFLEX) 2 MG tablet Take 1 tablet (2 mg total) by mouth at bedtime. 10/15/18  Yes Nafziger, [name], NP  blood glucose meter kit and supplies Dispense based on [employer]. Test blood sugars once daily. Dx: E10.9 08/16/16   [name], NP  glucose blood (FREESTYLE LITE) test strip Test once daily dx E10.9 10/15/18   Nafziger, [name], NP  HYDROcodone-acetaminophen (NORCO/VICODIN) 5-325 MG tablet Take 1-2 tablets by mouth every 6 (six) hours as needed for moderate pain. 10/28/18   Fredia Sorrow, MD  Insulin Pen Needle (B-D ULTRAFINE III SHORT PEN) 31G X 8 MM MISC USE WITH LANUTS PEN 10/15/18   Nafziger, [name], NP  [facility] DELICA LANCETS 82N MISC TEST BLOOD SUGAR ONCE DAILY 07/05/17   Nafziger, [name], NP  predniSONE (DELTASONE) 10 MG tablet Take 4 tablets (40 mg total) by mouth daily. 10/28/18   Fredia Sorrow, MD    Family History Family History  Problem Relation Age of Onset  . Glaucoma Mother   . Diabetes Mother   . Stomach cancer Maternal Grandfather   . Stroke Maternal  Grandmother     Social History Social History   Tobacco Use  . Smoking status: Never Smoker  . Smokeless tobacco: Never Used  Substance Use Topics  . Alcohol use: No  . Drug use: No     Allergies   Lactose intolerance (gi); Apple pectin [pectin]; Peach [prunus persica]; and Morphine and related   Review of Systems Review of Systems  Constitutional: Negative for chills and fever.  HENT: Negative for congestion, rhinorrhea and sore throat.   Eyes: Negative for visual disturbance.  Respiratory: Negative for cough and shortness of breath.   Cardiovascular: Negative for chest pain and leg swelling.  Gastrointestinal: Negative for abdominal pain, diarrhea, nausea and vomiting.  Genitourinary: Negative for difficulty urinating and dysuria.  Musculoskeletal: Positive for  back pain. Negative for neck pain.  Skin: Negative for rash.  Neurological: Negative for dizziness, weakness, light-headedness, numbness and headaches.  Hematological: Does not bruise/bleed easily.  Psychiatric/Behavioral: Negative for confusion.     Physical Exam Updated Vital Signs BP (!) 144/73   Pulse 73   Temp 97.9 F (36.6 C) (Oral)   Resp 16   SpO2 100%   Physical Exam Vitals signs and nursing note reviewed.  Constitutional:      Appearance: He is well-developed.  HENT:     Head: Normocephalic and atraumatic.     Mouth/Throat:     Mouth: Mucous membranes are moist.  Eyes:     Extraocular Movements: Extraocular movements intact.     Conjunctiva/sclera: Conjunctivae normal.     Pupils: Pupils are equal, round, and reactive to light.  Neck:     Musculoskeletal: Neck supple.  Cardiovascular:     Rate and Rhythm: Normal rate and regular rhythm.     Heart sounds: Normal heart sounds. No murmur.  Pulmonary:     Effort: Pulmonary effort is normal. No respiratory distress.     Breath sounds: Normal breath sounds.  Abdominal:     General: Bowel sounds are normal.     Palpations: Abdomen is  soft.     Tenderness: There is no abdominal tenderness.  Musculoskeletal:        General: No swelling.  Skin:    General: Skin is warm and dry.     Capillary Refill: Capillary refill takes less than 2 seconds.  Neurological:     General: No focal deficit present.     Mental Status: He is alert and oriented to person, place, and time.      ED Treatments / Results  Labs (all labs ordered are listed, but only abnormal results are displayed) Labs Reviewed  CBC - Abnormal; Notable for the following components:      Result Value   RBC 3.45 (*)    Hemoglobin 10.0 (*)    HCT 32.0 (*)    All other components within normal limits  BASIC METABOLIC PANEL - Abnormal; Notable for the following components:   Glucose, Bld 228 (*)    Creatinine, Ser 1.71 (*)    GFR calc non Af Amer 41 (*)    GFR calc Af Amer 47 (*)    All other components within normal limits  CBG MONITORING, ED - Abnormal; Notable for the following components:   Glucose-Capillary 173 (*)    All other components within normal limits    EKG None  Radiology Mr Lumbar Spine Wo Contrast  Result Date: 10/28/2018 CLINICAL DATA:  Low back pain radiating to the left leg. EXAM: MRI LUMBAR SPINE WITHOUT CONTRAST TECHNIQUE: Multiplanar, multisequence MR imaging of the lumbar spine was performed. No intravenous contrast was administered. COMPARISON:  None. FINDINGS: Segmentation:  5 lumbar type vertebral bodies. Alignment:  Normal Vertebrae: No fracture or primary bone lesion. Mild discogenic edema at the anterior inferior corner of L5. Conus medullaris and cauda equina: Conus extends to the L1 level. Conus and cauda equina appear normal. Paraspinal and other soft tissues: Negative Disc levels: No significant finding at L2-3 or above. L3-4: Disc degeneration with annular fissures and annular bulging. Mild stenosis of both lateral recesses but no visible neural compression. L4-5: Circumferential bulging of the disc. Facet osteoarthritis  with mild hypertrophy. Mild narrowing of both lateral recesses. Mild left foraminal encroachment by osteophyte in disc material. Some potential the left L4 nerve could be  affected as it exits. Facet arthropathy could also contribute to low back pain or referred facet syndrome pain. L5-S1: Broad-based, left posterolateral predominant disc herniation that indents the thecal sac. Facet and ligamentous hypertrophy. Narrowing of the subarticular lateral recesses left more than right. Left S1 nerve could be affected in this location. IMPRESSION: In this patient with left radicular pain, the most suspicious abnormality is at L5-S1 where there is a broad-based, left posterolateral predominant disc herniation with potential to compress in particular the left S1 nerve in the lateral recess. L4-5 shows bulging of the disc with focal prominence in the left foraminal region which would have some potential to affect the left L4 nerve as it exits, though definite compression is not established. 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 L3-4 shows annular bulging with annular fissures. Mild narrowing of the lateral recesses but no distinct neural compression. Electronically Signed   By: Nelson Chimes M.D.   On: 10/28/2018 11:25    Procedures Procedures (including critical care time)  Medications Ordered in ED Medications  HYDROmorphone (DILAUDID) injection 0.5 mg (0.5 mg Intravenous Given 10/28/18 0913)  ondansetron (ZOFRAN) injection 4 mg (4 mg Intravenous Given 10/28/18 0912)     Initial Impression / Assessment and Plan / ED Course  I have reviewed the triage vital signs and the nursing notes.  Pertinent labs & imaging results that were available during my care of the patient were reviewed by me and considered in my medical decision making (see chart for details).        MRI showing evidence of herniated disc L4-L5 and L5 and S1.  May be responsible for symptoms.  Patient not  showing any evidence of sciatica however.  Will treat with prednisone and hydrocodone for pain.  Prednisone to see if and get the disc to heal from an inflammatory standpoint we will have him follow-up with his regular doctor as well as a referral to neurosurgery since has been struggling with this now for about 4 months.    Final Clinical Impressions(s) / ED Diagnoses   Final diagnoses:  Acute left-sided low back pain without sciatica    ED Discharge Orders         Ordered    predniSONE (DELTASONE) 10 MG tablet  Daily     10/28/18 1357    HYDROcodone-acetaminophen (NORCO/VICODIN) 5-325 MG tablet  Every 6 hours PRN,   Status:  Discontinued     10/28/18 1357    HYDROcodone-acetaminophen (NORCO/VICODIN) 5-325 MG tablet  Every 6 hours PRN     10/28/18 1359           Fredia Sorrow, MD 10/28/18 1404

## 2018-10-30 ENCOUNTER — Encounter: Payer: Self-pay | Admitting: Adult Health

## 2018-10-30 ENCOUNTER — Ambulatory Visit (INDEPENDENT_AMBULATORY_CARE_PROVIDER_SITE_OTHER): Payer: Medicare Other | Admitting: Adult Health

## 2018-10-30 VITALS — BP 150/80 | Temp 97.7°F | Wt 184.0 lb

## 2018-10-30 DIAGNOSIS — M545 Low back pain, unspecified: Secondary | ICD-10-CM

## 2018-10-30 DIAGNOSIS — N289 Disorder of kidney and ureter, unspecified: Secondary | ICD-10-CM | POA: Diagnosis not present

## 2018-10-30 LAB — BASIC METABOLIC PANEL
BUN: 38 mg/dL — ABNORMAL HIGH (ref 6–23)
CO2: 27 mEq/L (ref 19–32)
Calcium: 8.7 mg/dL (ref 8.4–10.5)
Chloride: 102 mEq/L (ref 96–112)
Creatinine, Ser: 1.99 mg/dL — ABNORMAL HIGH (ref 0.40–1.50)
GFR: 40.74 mL/min — ABNORMAL LOW (ref >60.00)
Glucose, Bld: 368 mg/dL — ABNORMAL HIGH (ref 70–99)
Potassium: 5.3 mEq/L — ABNORMAL HIGH (ref 3.5–5.1)
Sodium: 136 mEq/L (ref 135–145)

## 2018-10-30 LAB — POCT URINALYSIS DIPSTICK
Bilirubin, UA: NEGATIVE
GLUCOSE UA: POSITIVE — AB
Ketones, UA: NEGATIVE
Leukocytes, UA: NEGATIVE
Nitrite, UA: NEGATIVE
Protein, UA: POSITIVE — AB
Spec Grav, UA: 1.025 (ref 1.010–1.025)
Urobilinogen, UA: 0.2 E.U./dL
pH, UA: 5 (ref 5.0–8.0)

## 2018-10-30 NOTE — Progress Notes (Signed)
Subjective:    Patient ID: Russell Austin, male    DOB: 1951-03-05, 69 y.o.   MRN: [id number]  HPI 68 year old male who  has a past medical history of Blindness, legal (RIGHT EYE SECONDARY TO ACUTE GLAUCOMA), Diabetes mellitus type II, Diabetic retinopathy (FOLLOWED BY DR Zadie Rhine), ED (erectile dysfunction), Glaucoma of both eyes, Hypertension, Left hydrocele, and Stroke ([place]).  Presents to the office today for follow-up after ER visit 2 days ago.  Was seen for lower lumbar back pain no radiation to the leg, no numbness or weakness to the left leg or foot.  Orts pain woke him up from sleep and he was unable to walk.  He had an MRI of the lumbar spine done in the emergency room which showed:  In this patient with left radicular pain, the most suspicious abnormality is at L5-S1 where there is a broad-based, left posterolateral predominant disc herniation with potential to compress in particular the left S1 nerve in the lateral recess.  L4-5 shows bulging of the disc with focal prominence in the left foraminal region which would have some potential to affect the left L4 nerve as it exits, though definite compression is not established. 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  L3-4 shows annular bulging with annular fissures. Mild narrowing of the lateral recesses but no distinct neural compression.  Was discharged with a course of prednisone and hydrocodone for pain.  A prednisone to see if he gets the discs to heal from an inflammatory standpoint.  He will also need a referral to neurosurgery  Today in the office he reports that since being released from the hospital he has been taking his medications as directed. He is having significantly less discomfort in his lower back. He has an appointment scheduled next week with Dr. Zada Finders at [facility].   He denies any issues with sciatica or bowel/bladder.    Review of  Systems See HPI   Past Medical History:  Diagnosis Date  . 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 Diabetes mellitus type II   . Diabetic retinopathy FOLLOWED BY DR Zadie Rhine  . ED (erectile dysfunction)   . Glaucoma of both eyes   . Hypertension   . Left hydrocele   . Stroke [facility])     Social History   Socioeconomic History  . Marital status: Married    Spouse name: Not on file  . Number of children: 1  . Years of education: 29  . Highest education level: Not on file  Occupational History  . Occupation: Disabled  Social Needs  . Financial resource strain: Not on file  . Food insecurity:    Worry: Not on file    Inability: Not on file  . Transportation needs:    Medical: Not on file    Non-medical: Not on file  Tobacco Use  . Smoking status: Never Smoker  . Smokeless tobacco: Never Used  Substance and Sexual Activity  . Alcohol use: No  . Drug use: No  . Sexual activity: Not on file  Lifestyle  . Physical activity:    Days per week: Not on file    Minutes per session: Not on file  . Stress: Not on file  Relationships  . Social connections:    Talks on phone: Not on file    Gets together: Not on file    Attends religious service: Not on file    Active  member of club or organization: Not on file    Attends meetings of clubs or organizations: Not on file    Relationship status: Not on file  . Intimate partner violence:    Fear of current or ex partner: Not on file    Emotionally abused: Not on file    Physically abused: Not on file    Forced sexual activity: Not on file  Other Topics Concern  . Not on file  Social History Narrative   Lives at home with his wife and granddaughter.   Left-handed.   3 cups caffeine per day.    Past Surgical History:  Procedure Laterality Date  . APPENDECTOMY  AGE EARLY 20'S  . HYDROCELE EXCISION  03/31/2012   Procedure: HYDROCELECTOMY ADULT;  Surgeon: Franchot Gallo, MD;  [facility];  Service: Urology;  Laterality: Left;  45 MINS    . LEFT EYE LASER RETINA REPAIR  SEPT 2012  . RIGHT EYE VITRECTOMY/ INSERTION GLAUCOMA SETON/ LASER REPAIR  12-13-2008   RETINAL ARTERY OCCLUSION /NEOVASCULAR GLAUCOMA/ HEMORRHAGE  . RIGHT EYE VITRETOMY/ INSERTION GLAUCOMA SETON X2/ LASER  03-24-2009   RECURRENT HEMORRHAGE/ OCCLUSION INTERNAL SETON  . SHOULDER ARTHROSCOPY Right 2005  . undescended right testicle removed  1994    Family History  Problem Relation Age of Onset  . Glaucoma Mother   . Diabetes Mother   . Stomach cancer Maternal Grandfather   . Stroke Maternal Grandmother     Allergies  Allergen Reactions  . Lactose Intolerance (Gi) Diarrhea  . Apple Pectin [Pectin] Itching    ITCHY THROAT  . Peach [Prunus Persica] Itching    ITCHY THROAT  . Morphine And Related Anxiety    Jittery    Current Outpatient Medications on File Prior to Visit  Medication Sig Dispense Refill  . amLODipine (NORVASC) 10 MG tablet TAKE 1 TABLET EVERY DAY (Patient taking differently: Take 10 mg by mouth daily. ) 90 tablet 0  . aspirin 325 MG tablet Take 1 tablet (325 mg total) by mouth daily. 30 tablet 0  . benzonatate (TESSALON) 100 MG capsule Take 1 capsule (100 mg total) by mouth 2 (two) times daily as needed for cough. 20 capsule 0  . Besifloxacin HCl (BESIVANCE) 0.6 % SUSP Place 1 drop into the left eye 3 (three) times daily. For 2 weeks.    . blood glucose meter kit and supplies Dispense based on Insurance. Test blood sugars once daily. Dx: E10.9 1 each 0  . buPROPion (WELLBUTRIN SR) 150 MG 12 hr tablet Take 1 tablet (150 mg total) by mouth 2 (two) times daily. 180 tablet 1  . DUREZOL 0.05 % EMUL Place 1 drop into the left eye See admin instructions. Instill one drop in the left eye three times daily for 1 week, then twice daily for 1 week, then one time daily for 1 week then stop.    . furosemide (LASIX) 20 MG tablet TAKE 1 TABLET (20 MG TOTAL) EVERY OTHER DAY BY MOUTH. (Patient  taking differently: Take 20 mg by mouth daily. ) 90 tablet 1  . gabapentin (NEURONTIN) 300 MG capsule TAKE 1 CAPSULE (300 MG TOTAL) BY MOUTH AT BEDTIME. 90 capsule 0  . glucose blood (FREESTYLE LITE) test strip Test once daily dx E10.9 100 each 3  . HYDROcodone-acetaminophen (NORCO/VICODIN) 5-325 MG tablet Take 1-2 tablets by mouth every 6 (six) hours as needed for moderate pain. 10 tablet 0  . Insulin Glargine (BASAGLAR KWIKPEN) 100 UNIT/ML SOPN Inject  0.2 mLs (20 Units total) into the skin daily. PLEASE DISCONTINUE THE LANTUS - THANKS (Patient taking differently: Inject 20 Units into the skin daily. ) 15 mL 0  . Insulin Pen Needle (B-D ULTRAFINE III SHORT PEN) 31G X 8 MM MISC USE WITH LANUTS PEN 100 each 3  . losartan-hydrochlorothiazide (HYZAAR) 100-25 MG tablet Take 1 tablet by mouth daily. 90 tablet 3  . metFORMIN (GLUCOPHAGE) 1000 MG tablet TAKE 1 TABLET (1,000 MG TOTAL) 2 (TWO) TIMES DAILY WITH A MEAL BY MOUTH. 180 tablet 0  . metoprolol tartrate (LOPRESSOR) 50 MG tablet TAKE 1 TABLET BY MOUTH TWICE A DAY (Patient taking differently: Take 50 mg by mouth 2 (two) times daily. ) 180 tablet 0  . mometasone-formoterol (DULERA) 100-5 MCG/ACT AERO Inhale 2 puffs into the lungs 2 (two) times daily.    Glory Rosebush DELICA LANCETS 27N MISC TEST BLOOD SUGAR ONCE DAILY 100 each 0  . predniSONE (DELTASONE) 10 MG tablet Take 4 tablets (40 mg total) by mouth daily. 20 tablet 0  . PROLENSA 0.07 % SOLN Place 1 drop into the left eye at bedtime.    Marland Kitchen tiZANidine (ZANAFLEX) 2 MG tablet Take 1 tablet (2 mg total) by mouth at bedtime. 90 tablet 1   No current facility-administered medications on file prior to visit.     There were no vitals taken for this visit.      Objective:   Physical Exam Vitals signs and nursing note reviewed.  Constitutional:      Appearance: Normal appearance.  Cardiovascular:     Rate and Rhythm: Normal rate and regular rhythm.     Pulses: Normal pulses.     Heart sounds:  Normal heart sounds.  Pulmonary:     Effort: Pulmonary effort is normal.     Breath sounds: Normal breath sounds.  Musculoskeletal:        General: No tenderness.     Right lower leg: Edema present.     Left lower leg: Edema present.  Skin:    General: Skin is warm and dry.  Neurological:     General: No focal deficit present.     Mental Status: He is alert and oriented to person, place, and time.  Psychiatric:        Mood and Affect: Mood normal.        Behavior: Behavior normal.        Thought Content: Thought content normal.        Judgment: Judgment normal.        Assessment & Plan:  1. Acute right-sided low back pain without sciatica - Finish prednisone therapy  - Encouraged to increase water to keep blood sugars low  - Follow up as needed  2. Function kidney decreased - POC Urinalysis Dipstick - Basic Metabolic Panel   [name], NP

## 2018-10-31 ENCOUNTER — Other Ambulatory Visit: Payer: Medicare Other

## 2018-10-31 ENCOUNTER — Other Ambulatory Visit: Payer: Self-pay | Admitting: Adult Health

## 2018-10-31 ENCOUNTER — Telehealth: Payer: Self-pay | Admitting: Adult Health

## 2018-10-31 MED ORDER — HYDROCODONE-ACETAMINOPHEN 5-325 MG PO TABS: 1.0000 | ORAL_TABLET | Freq: Four times a day (QID) | ORAL | 0 refills | Status: DC | PRN

## 2018-10-31 NOTE — Telephone Encounter (Signed)
Pt's wife returned call for lab results. NT unavailable. Please advise.  Copied from [place] [phone]. Topic: Quick Communication - Lab Results (Clinic Use ONLY) >> Oct 31, 2018  3:57 PM Miles Costain T, [place] wrote: Called patient to inform them of all lab results 10/30/2018. When patient returns call, triage nurse may disclose results.

## 2018-10-31 NOTE — Telephone Encounter (Signed)
Copied from [place] [phone]. Topic: Quick Communication - Rx Refill/Question >> Oct 31, 2018  8:01 AM Burchel, Abbi R wrote: Medication: HYDROcodone-acetaminophen (NORCO/VICODIN) 5-325 MG tablet  Pt states he was instructed by [name] at last OV to call if he needs pain pills. Pt states he does need them.   Preferred Pharmacy: [id number] - [place], [place] [address] Phone: [phone] Fax: [phone] Not a 24 hour pharmacy; exact hours not known.

## 2018-11-04 DIAGNOSIS — E113512 Type 2 diabetes mellitus with proliferative diabetic retinopathy with macular edema, left eye: Secondary | ICD-10-CM | POA: Diagnosis not present

## 2018-11-04 DIAGNOSIS — H3582 Retinal ischemia: Secondary | ICD-10-CM | POA: Diagnosis not present

## 2018-11-04 DIAGNOSIS — H31012 Macula scars of posterior pole (postinflammatory) (post-traumatic), left eye: Secondary | ICD-10-CM | POA: Diagnosis not present

## 2018-11-04 NOTE — Telephone Encounter (Signed)
Pt. Given lab results and verbalizes understanding. States he has finished his steroids "and my sugars are coming down."

## 2018-11-04 NOTE — Telephone Encounter (Signed)
Pt wife is aware she will received a callback

## 2018-11-06 DIAGNOSIS — M5416 Radiculopathy, lumbar region: Secondary | ICD-10-CM | POA: Diagnosis not present

## 2018-11-07 ENCOUNTER — Other Ambulatory Visit: Payer: Self-pay | Admitting: Neurological Surgery

## 2018-11-07 DIAGNOSIS — M5416 Radiculopathy, lumbar region: Secondary | ICD-10-CM

## 2018-11-12 ENCOUNTER — Other Ambulatory Visit: Payer: Self-pay

## 2018-11-12 ENCOUNTER — Ambulatory Visit
Admission: RE | Admit: 2018-11-12 | Discharge: 2018-11-12 | Disposition: A | Discharge status: Home / Self Care | Payer: Medicare Other | Source: Ambulatory Visit | Attending: Neurological Surgery | Admitting: Neurological Surgery

## 2018-11-12 DIAGNOSIS — M5416 Radiculopathy, lumbar region: Secondary | ICD-10-CM

## 2018-11-12 DIAGNOSIS — M5127 Other intervertebral disc displacement, lumbosacral region: Secondary | ICD-10-CM | POA: Diagnosis not present

## 2018-11-12 MED ORDER — IOPAMIDOL (ISOVUE-M 200) INJECTION 41%
1.0000 mL | Freq: Once | INTRAMUSCULAR | Status: AC
  Administered 2018-11-12: 1 mL via EPIDURAL

## 2018-11-12 MED ORDER — METHYLPREDNISOLONE ACETATE 40 MG/ML INJ SUSP (RADIOLOG
120.0000 mg | Freq: Once | INTRAMUSCULAR | Status: AC
  Administered 2018-11-12: 120 mg via EPIDURAL

## 2018-11-12 NOTE — Discharge Instructions (Signed)

## 2018-11-27 ENCOUNTER — Telehealth: Payer: Self-pay | Admitting: Adult Health

## 2018-11-27 DIAGNOSIS — M5416 Radiculopathy, lumbar region: Secondary | ICD-10-CM | POA: Diagnosis not present

## 2018-11-27 NOTE — Telephone Encounter (Signed)
Copied from [place] [id number]. Topic: Quick Communication - Rx Refill/Question >> Nov 27, 2018  4:20 PM Richardo Priest, [place] wrote: Medication:  amLODipine (NORVASC) 10 MG tablet  Has the patient contacted their pharmacy? Yes, Patient stated they are running low, and no longer have any refills.  Preferred Pharmacy (with phone number or street name):    Agent: Please be advised that RX refills may take up to 3 business days. We ask that you follow-up with your pharmacy.

## 2018-11-28 ENCOUNTER — Other Ambulatory Visit: Payer: Self-pay | Admitting: Adult Health

## 2018-11-28 DIAGNOSIS — I10 Essential (primary) hypertension: Secondary | ICD-10-CM

## 2018-11-28 MED ORDER — AMLODIPINE BESYLATE 10 MG PO TABS: 10.0000 mg | ORAL_TABLET | Freq: Every day | ORAL | 1 refills | Status: DC

## 2018-12-17 DIAGNOSIS — E113512 Type 2 diabetes mellitus with proliferative diabetic retinopathy with macular edema, left eye: Secondary | ICD-10-CM | POA: Diagnosis not present

## 2019-01-01 DIAGNOSIS — I87303 Chronic venous hypertension (idiopathic) without complications of bilateral lower extremity: Secondary | ICD-10-CM | POA: Diagnosis not present

## 2019-01-09 DIAGNOSIS — J3089 Other allergic rhinitis: Secondary | ICD-10-CM | POA: Diagnosis not present

## 2019-01-09 DIAGNOSIS — J4540 Moderate persistent asthma, uncomplicated: Secondary | ICD-10-CM | POA: Diagnosis not present

## 2019-01-22 DIAGNOSIS — I87303 Chronic venous hypertension (idiopathic) without complications of bilateral lower extremity: Secondary | ICD-10-CM | POA: Diagnosis not present

## 2019-01-22 DIAGNOSIS — L905 Scar conditions and fibrosis of skin: Secondary | ICD-10-CM | POA: Diagnosis not present

## 2019-01-22 DIAGNOSIS — R238 Other skin changes: Secondary | ICD-10-CM | POA: Diagnosis not present

## 2019-01-22 DIAGNOSIS — M7989 Other specified soft tissue disorders: Secondary | ICD-10-CM | POA: Diagnosis not present

## 2019-01-22 DIAGNOSIS — I878 Other specified disorders of veins: Secondary | ICD-10-CM | POA: Diagnosis not present

## 2019-02-03 ENCOUNTER — Telehealth: Payer: Self-pay | Admitting: Adult Health

## 2019-02-03 NOTE — Telephone Encounter (Signed)
Copied from [place] [phone]. Topic: Quick Communication - Rx Refill/Question >> Feb 03, 2019 12:36 PM Rayann Heman wrote: Medication:metFORMIN (GLUCOPHAGE) 1000 MG tablet [[id number]] pt called and stated that this medication causes cancer and would like another mediation sent over. Please advise

## 2019-02-04 NOTE — Telephone Encounter (Signed)
Tried to reach the pt by telephone.  Received a busy signal.  Will try again at a later time.

## 2019-02-04 NOTE — Telephone Encounter (Signed)
It was the extended release from a certain manufacturer that the FDA is investigating. His is not extended release.

## 2019-02-10 ENCOUNTER — Other Ambulatory Visit: Payer: Self-pay | Admitting: Family Medicine

## 2019-02-10 DIAGNOSIS — E1140 Type 2 diabetes mellitus with diabetic neuropathy, unspecified: Secondary | ICD-10-CM

## 2019-02-10 DIAGNOSIS — Z794 Long term (current) use of insulin: Secondary | ICD-10-CM

## 2019-02-10 DIAGNOSIS — H4089 Other specified glaucoma: Secondary | ICD-10-CM | POA: Diagnosis not present

## 2019-02-10 DIAGNOSIS — H40012 Open angle with borderline findings, low risk, left eye: Secondary | ICD-10-CM | POA: Diagnosis not present

## 2019-02-10 NOTE — Telephone Encounter (Signed)
Spoke to the pt and informed him of below message.  Informed pt that he is now due for A1C and follow up.  Pt says he does not come every 3 months.  Judging by schedule you do not see him every 6 months but every 3 months.  Ok to wait?

## 2019-02-10 NOTE — Telephone Encounter (Signed)
Pt now scheduled for follow up and lab work.  Nothing further needed.

## 2019-02-10 NOTE — Telephone Encounter (Signed)
He has been coming every three months and where his A1c stands currently, I would like to check

## 2019-02-19 ENCOUNTER — Other Ambulatory Visit: Payer: Self-pay

## 2019-02-19 ENCOUNTER — Other Ambulatory Visit (INDEPENDENT_AMBULATORY_CARE_PROVIDER_SITE_OTHER): Payer: Medicare Other

## 2019-02-19 DIAGNOSIS — Z794 Long term (current) use of insulin: Secondary | ICD-10-CM | POA: Diagnosis not present

## 2019-02-19 DIAGNOSIS — E1140 Type 2 diabetes mellitus with diabetic neuropathy, unspecified: Secondary | ICD-10-CM

## 2019-02-19 LAB — HEMOGLOBIN A1C: Hgb A1c MFr Bld: 8.4 % — ABNORMAL HIGH (ref 4.6–6.5)

## 2019-02-20 ENCOUNTER — Ambulatory Visit: Payer: Medicare Other | Admitting: Adult Health

## 2019-02-24 ENCOUNTER — Other Ambulatory Visit: Payer: Self-pay

## 2019-02-24 ENCOUNTER — Encounter: Payer: Self-pay | Admitting: Adult Health

## 2019-02-24 ENCOUNTER — Ambulatory Visit (INDEPENDENT_AMBULATORY_CARE_PROVIDER_SITE_OTHER): Payer: Medicare Other | Admitting: Adult Health

## 2019-02-24 DIAGNOSIS — Z794 Long term (current) use of insulin: Secondary | ICD-10-CM | POA: Diagnosis not present

## 2019-02-24 DIAGNOSIS — E1140 Type 2 diabetes mellitus with diabetic neuropathy, unspecified: Secondary | ICD-10-CM | POA: Diagnosis not present

## 2019-02-24 MED ORDER — FREESTYLE LITE TEST VI STRP: ORAL_STRIP | 3 refills | Status: DC

## 2019-02-24 MED ORDER — FREESTYLE LANCETS MISC: 3 refills | Status: DC

## 2019-02-24 MED ORDER — FREESTYLE LITE DEVI: 0 refills | Status: DC

## 2019-02-24 MED ORDER — METFORMIN HCL 1000 MG PO TABS: 1000.0000 mg | ORAL_TABLET | Freq: Two times a day (BID) | ORAL | 0 refills | Status: DC

## 2019-02-24 NOTE — Progress Notes (Signed)
Virtual Visit via Video Note  I connected with Russell Austin 02/24/19 at  8:00 AM EDT by a video enabled telemedicine application and verified that I am speaking with the correct person using two identifiers.  Location patient: home Location provider:work or home office Persons participating in the virtual visit: patient, provider  I discussed the limitations of evaluation and management by telemedicine and the availability of in person appointments. The patient expressed understanding and agreed to proceed.   HPI: 68 year old male who is being evaluated today for follow-up regarding diabetes.  Is currently maintained on Basaglar 20 units daily, and metformin thousand milligrams twice daily.  His last A1c 3 months ago was 8.7.  He does report infrequent episodes of hypoglycemia, last week he had one episode.  He is eating a lot of carbs and unfortunately he does not get a lot of exercise due to history of stroke and peripheral neuropathy.  He has not been checking his blood sugars at home as he misplaced his meter and has not been able to find it.   ROS: See pertinent positives and negatives per HPI.  Past Medical History:  Diagnosis Date  . 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 Diabetes mellitus type II   . Diabetic retinopathy FOLLOWED BY DR Zadie Rhine  . ED (erectile dysfunction)   . Glaucoma of both eyes   . Hypertension   . Left hydrocele   . Stroke [facility])     Past Surgical History:  Procedure Laterality Date  . APPENDECTOMY  AGE EARLY 20'S  . HYDROCELE EXCISION  03/31/2012   Procedure: HYDROCELECTOMY ADULT;  Surgeon: Franchot Gallo, MD;  Location: [facility];  Service: Urology;  Laterality: Left;  45 MINS    . LEFT EYE LASER RETINA REPAIR  SEPT 2012  . RIGHT EYE VITRECTOMY/ INSERTION GLAUCOMA SETON/ LASER REPAIR  12-13-2008   RETINAL ARTERY OCCLUSION /NEOVASCULAR GLAUCOMA/ HEMORRHAGE  . RIGHT EYE VITRETOMY/ INSERTION GLAUCOMA SETON  X2/ LASER  03-24-2009   RECURRENT HEMORRHAGE/ OCCLUSION INTERNAL SETON  . SHOULDER ARTHROSCOPY Right 2005  . undescended right testicle removed  1994    Family History  Problem Relation Age of Onset  . Glaucoma Mother   . Diabetes Mother   . Stomach cancer Maternal Grandfather   . Stroke Maternal Grandmother      Current Outpatient Medications:  .  amLODipine (NORVASC) 10 MG tablet, Take 1 tablet (10 mg total) by mouth daily., Disp: 90 tablet, Rfl: 1 .  aspirin EC 81 MG tablet, Take 81 mg by mouth daily., Disp: , Rfl:  .  atorvastatin (LIPITOR) 10 MG tablet, Take 10 mg by mouth daily., Disp: , Rfl:  .  benzonatate (TESSALON) 100 MG capsule, Take 1 capsule (100 mg total) by mouth 2 (two) times daily as needed for cough., Disp: 20 capsule, Rfl: 0 .  Besifloxacin HCl (BESIVANCE) 0.6 % SUSP, Place 1 drop into the left eye 3 (three) times daily. For 2 weeks., Disp: , Rfl:  .  blood glucose meter kit and supplies, Dispense based on Insurance. Test blood sugars once daily. Dx: E10.9, Disp: 1 each, Rfl: 0 .  buPROPion (WELLBUTRIN SR) 150 MG 12 hr tablet, Take 1 tablet (150 mg total) by mouth 2 (two) times daily., Disp: 180 tablet, Rfl: 1 .  DUREZOL 0.05 % EMUL, Place 1 drop into the left eye See admin instructions. Instill one drop in the left eye three times daily for 1 week, then twice daily for 1  week, then one time daily for 1 week then stop., Disp: , Rfl:  .  gabapentin (NEURONTIN) 300 MG capsule, TAKE 1 CAPSULE (300 MG TOTAL) BY MOUTH AT BEDTIME., Disp: 90 capsule, Rfl: 0 .  glucose blood (FREESTYLE LITE) test strip, Test once daily dx E10.9, Disp: 100 each, Rfl: 3 .  HYDROcodone-acetaminophen (NORCO/VICODIN) 5-325 MG tablet, Take 1-2 tablets by mouth every 6 (six) hours as needed for moderate pain., Disp: 10 tablet, Rfl: 0 .  Insulin Glargine (BASAGLAR KWIKPEN) 100 UNIT/ML SOPN, Inject 0.2 mLs (20 Units total) into the skin daily. PLEASE DISCONTINUE THE LANTUS - THANKS (Patient taking  differently: Inject 20 Units into the skin daily. ), Disp: 15 mL, Rfl: 0 .  Insulin Pen Needle (B-D ULTRAFINE III SHORT PEN) 31G X 8 MM MISC, USE WITH LANUTS PEN, Disp: 100 each, Rfl: 3 .  losartan-hydrochlorothiazide (HYZAAR) 100-25 MG tablet, Take 1 tablet by mouth daily., Disp: 90 tablet, Rfl: 3 .  metFORMIN (GLUCOPHAGE) 1000 MG tablet, TAKE 1 TABLET (1,000 MG TOTAL) 2 (TWO) TIMES DAILY WITH A MEAL BY MOUTH., Disp: 180 tablet, Rfl: 0 .  metoprolol tartrate (LOPRESSOR) 50 MG tablet, TAKE 1 TABLET BY MOUTH TWICE A DAY (Patient taking differently: Take 50 mg by mouth 2 (two) times daily. ), Disp: 180 tablet, Rfl: 0 .  ONETOUCH DELICA LANCETS 69G MISC, TEST BLOOD SUGAR ONCE DAILY, Disp: 100 each, Rfl: 0 .  predniSONE (DELTASONE) 10 MG tablet, Take 4 tablets (40 mg total) by mouth daily., Disp: 20 tablet, Rfl: 0 .  PROLENSA 0.07 % SOLN, Place 1 drop into the left eye at bedtime., Disp: , Rfl:  .  tiZANidine (ZANAFLEX) 2 MG tablet, Take 1 tablet (2 mg total) by mouth at bedtime., Disp: 90 tablet, Rfl: 1  EXAM:  VITALS per patient if applicable:  GENERAL: alert, oriented, appears well and in no acute distress  HEENT: atraumatic, conjunttiva clear, no obvious abnormalities on inspection of external nose and ears  NECK: normal movements of the head and neck  LUNGS: on inspection no signs of respiratory distress, breathing rate appears normal, no obvious gross SOB, gasping or wheezing  CV: no obvious cyanosis  MS: moves all visible extremities without noticeable abnormality  PSYCH/NEURO: pleasant and cooperative, no obvious depression or anxiety, speech and thought processing grossly intact  ASSESSMENT AND PLAN:  Discussed the following assessment and plan:  1. Type 2 diabetes mellitus with diabetic neuropathy, with long-term current use of insulin (HCC) -I will send in a new meter and testing supplies.  He was advised to cut back on carbohydrates.  Would also like him to drink more water.   No change in medication therapy at this time we will follow-up in 90 days. - Blood Glucose Monitoring Suppl ([place]) DEVI; Please dispense one Freestyle Lite Glucometer  Dispense: 1 kit; Refill: 0 - Lancets (FREESTYLE) lancets; Use as instructed  Dispense: 100 each; Refill: 3 - glucose blood (FREESTYLE LITE) test strip; Test once daily dx E10.9  Dispense: 100 each; Refill: 3 - metFORMIN (GLUCOPHAGE) 1000 MG tablet; Take 1 tablet (1,000 mg total) by mouth 2 (two) times daily with a meal.  Dispense: 180 tablet; Refill: 0      I discussed the assessment and treatment plan with the patient. The patient was provided an opportunity to ask questions and all were answered. The patient agreed with the plan and demonstrated an understanding of the instructions.   The patient was advised to call back or seek an in-person  evaluation if the symptoms worsen or if the condition fails to improve as anticipated.   [name], NP

## 2019-03-11 DIAGNOSIS — E113512 Type 2 diabetes mellitus with proliferative diabetic retinopathy with macular edema, left eye: Secondary | ICD-10-CM | POA: Diagnosis not present

## 2019-03-11 DIAGNOSIS — H31012 Macula scars of posterior pole (postinflammatory) (post-traumatic), left eye: Secondary | ICD-10-CM | POA: Diagnosis not present

## 2019-03-11 DIAGNOSIS — H3582 Retinal ischemia: Secondary | ICD-10-CM | POA: Diagnosis not present

## 2019-03-18 ENCOUNTER — Telehealth: Payer: Self-pay | Admitting: Adult Health

## 2019-03-18 ENCOUNTER — Other Ambulatory Visit: Payer: Self-pay | Admitting: Adult Health

## 2019-03-18 DIAGNOSIS — I10 Essential (primary) hypertension: Secondary | ICD-10-CM

## 2019-03-18 DIAGNOSIS — Z794 Long term (current) use of insulin: Secondary | ICD-10-CM

## 2019-03-18 DIAGNOSIS — E1140 Type 2 diabetes mellitus with diabetic neuropathy, unspecified: Secondary | ICD-10-CM

## 2019-03-18 NOTE — Telephone Encounter (Signed)
See refill request.

## 2019-03-18 NOTE — Telephone Encounter (Signed)
Medication Refill - Medication: Insulin Glargine (BASAGLAR KWIKPEN) 100 UNIT/ML SOPN    Has the patient contacted their pharmacy? Yes.   (Agent: If no, request that the patient contact the pharmacy for the refill.) (Agent: If yes, when and what did the pharmacy advise?)  Preferred Pharmacy (with phone number or street name): [facility] [address]    Agent: Please be advised that RX refills may take up to 3 business days. We ask that you follow-up with your pharmacy.

## 2019-03-19 MED ORDER — BASAGLAR KWIKPEN 100 UNIT/ML ~~LOC~~ SOPN: 20.0000 [IU] | PEN_INJECTOR | Freq: Every day | SUBCUTANEOUS | 0 refills | Status: DC

## 2019-03-19 NOTE — Telephone Encounter (Signed)
Sent to the pharmacy by e-scribe. 

## 2019-03-24 ENCOUNTER — Other Ambulatory Visit: Payer: Self-pay | Admitting: Adult Health

## 2019-03-25 NOTE — Telephone Encounter (Signed)
Sent to the pharmacy by e-scribe. 

## 2019-04-06 ENCOUNTER — Other Ambulatory Visit: Payer: Self-pay

## 2019-04-21 DIAGNOSIS — M2042 Other hammer toe(s) (acquired), left foot: Secondary | ICD-10-CM | POA: Diagnosis not present

## 2019-04-21 DIAGNOSIS — E1351 Other specified diabetes mellitus with diabetic peripheral angiopathy without gangrene: Secondary | ICD-10-CM | POA: Diagnosis not present

## 2019-04-21 DIAGNOSIS — B351 Tinea unguium: Secondary | ICD-10-CM | POA: Diagnosis not present

## 2019-04-21 DIAGNOSIS — M2041 Other hammer toe(s) (acquired), right foot: Secondary | ICD-10-CM | POA: Diagnosis not present

## 2019-04-21 DIAGNOSIS — M722 Plantar fascial fibromatosis: Secondary | ICD-10-CM | POA: Diagnosis not present

## 2019-04-27 DIAGNOSIS — B351 Tinea unguium: Secondary | ICD-10-CM | POA: Diagnosis not present

## 2019-05-12 DIAGNOSIS — E1351 Other specified diabetes mellitus with diabetic peripheral angiopathy without gangrene: Secondary | ICD-10-CM | POA: Diagnosis not present

## 2019-05-12 DIAGNOSIS — B351 Tinea unguium: Secondary | ICD-10-CM | POA: Diagnosis not present

## 2019-05-12 DIAGNOSIS — M21612 Bunion of left foot: Secondary | ICD-10-CM | POA: Diagnosis not present

## 2019-05-12 DIAGNOSIS — L84 Corns and callosities: Secondary | ICD-10-CM | POA: Diagnosis not present

## 2019-05-12 DIAGNOSIS — M21611 Bunion of right foot: Secondary | ICD-10-CM | POA: Diagnosis not present

## 2019-05-12 DIAGNOSIS — L602 Onychogryphosis: Secondary | ICD-10-CM | POA: Diagnosis not present

## 2019-05-14 ENCOUNTER — Other Ambulatory Visit: Payer: Self-pay

## 2019-05-14 ENCOUNTER — Ambulatory Visit (INDEPENDENT_AMBULATORY_CARE_PROVIDER_SITE_OTHER): Payer: Medicare Other

## 2019-05-14 DIAGNOSIS — Z23 Encounter for immunization: Secondary | ICD-10-CM

## 2019-05-14 NOTE — Patient Instructions (Signed)
Health Maintenance Due  Topic Date Due  . INFLUENZA VACCINE  04/04/2019    Depression screen [facility] 2/9 10/10/2017 06/19/2016 10/07/2015  Decreased Interest 3 0 0  Down, Depressed, Hopeless 2 0 0  PHQ - 2 Score 5 0 0  Altered sleeping 0 - -  Tired, decreased energy 3 - -  Change in appetite 2 - -  Feeling bad or failure about yourself  3 - -  Trouble concentrating 3 - -  Moving slowly or fidgety/restless 0 - -  Suicidal thoughts 3 - -  PHQ-9 Score 19 - -  Difficult doing work/chores Very difficult - -

## 2019-05-24 ENCOUNTER — Other Ambulatory Visit: Payer: Self-pay | Admitting: Adult Health

## 2019-05-24 DIAGNOSIS — E1140 Type 2 diabetes mellitus with diabetic neuropathy, unspecified: Secondary | ICD-10-CM

## 2019-05-24 DIAGNOSIS — Z794 Long term (current) use of insulin: Secondary | ICD-10-CM

## 2019-05-26 NOTE — Telephone Encounter (Signed)
I scheduled the pt for his A1C lab visit and follow up with [facility].  Sent to the pharmacy by e-scribe for 90 days.

## 2019-05-27 ENCOUNTER — Other Ambulatory Visit: Payer: Self-pay | Admitting: Adult Health

## 2019-05-27 DIAGNOSIS — E1140 Type 2 diabetes mellitus with diabetic neuropathy, unspecified: Secondary | ICD-10-CM

## 2019-05-27 NOTE — Telephone Encounter (Signed)
Sent to the pharmacy by e-scribe. 

## 2019-05-29 ENCOUNTER — Other Ambulatory Visit: Payer: Self-pay

## 2019-05-29 ENCOUNTER — Other Ambulatory Visit: Payer: Medicare Other

## 2019-05-29 ENCOUNTER — Other Ambulatory Visit (INDEPENDENT_AMBULATORY_CARE_PROVIDER_SITE_OTHER): Payer: Medicare Other

## 2019-05-29 DIAGNOSIS — E1140 Type 2 diabetes mellitus with diabetic neuropathy, unspecified: Secondary | ICD-10-CM

## 2019-05-29 DIAGNOSIS — Z794 Long term (current) use of insulin: Secondary | ICD-10-CM | POA: Diagnosis not present

## 2019-05-29 LAB — HEMOGLOBIN A1C: Hgb A1c MFr Bld: 7.7 % — ABNORMAL HIGH (ref 4.6–6.5)

## 2019-06-02 ENCOUNTER — Other Ambulatory Visit: Payer: Self-pay

## 2019-06-02 ENCOUNTER — Telehealth (INDEPENDENT_AMBULATORY_CARE_PROVIDER_SITE_OTHER): Payer: Medicare Other | Admitting: Adult Health

## 2019-06-02 DIAGNOSIS — Z794 Long term (current) use of insulin: Secondary | ICD-10-CM | POA: Diagnosis not present

## 2019-06-02 DIAGNOSIS — E1140 Type 2 diabetes mellitus with diabetic neuropathy, unspecified: Secondary | ICD-10-CM | POA: Diagnosis not present

## 2019-06-02 NOTE — Progress Notes (Signed)
Virtual Visit via Video Note  I connected with Russell Austin on 06/02/19 at  1:00 PM EDT by a video enabled telemedicine application and verified that I am speaking with the correct person using two identifiers.  Location patient: home Location provider:work or home office Persons participating in the virtual visit: patient, provider  I discussed the limitations of evaluation and management by telemedicine and the availability of in person appointments. The patient expressed understanding and agreed to proceed.   HPI: 68 year old male who is being evaluated today for 90-monthfollow-up regarding diabetes.  He is currently prescribed Basaglar 20 units daily and metformin 1000 mg twice daily.  His last A1c in June was 8.4, this had decreased from 8.7-4 months prior.  He was asked to work on lifestyle modifications, specifically diet as he was eating a lot of carbs and sugars.  He is unable to exercise frequently due to neuropathy and weakness from CVA in the past.  Today he reports that he has been checking his blood sugars here and there, this morning his blood sugar was 112.  His diet continues to consist of carbs and sugars.  He denies hypoglycemic events  ROS: See pertinent positives and negatives per HPI.  Past Medical History:  Diagnosis Date  . 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 Diabetes mellitus type II   . Diabetic retinopathy FOLLOWED BY DR RZadie Rhine . ED (erectile dysfunction)   . Glaucoma of both eyes   . Hypertension   . Left hydrocele   . Stroke [facility]     Past Surgical History:  Procedure Laterality Date  . APPENDECTOMY  AGE EARLY 20'S  . HYDROCELE EXCISION  03/31/2012   Procedure: HYDROCELECTOMY ADULT;  Surgeon: SFranchot Gallo MD;  Location: [facility]  Service: Urology;  Laterality: Left;  45 MINS    . LEFT EYE LASER RETINA REPAIR  SEPT 2012  . RIGHT EYE VITRECTOMY/ INSERTION GLAUCOMA SETON/ LASER REPAIR  12-13-2008   RETINAL ARTERY OCCLUSION /NEOVASCULAR GLAUCOMA/ HEMORRHAGE  . RIGHT EYE VITRETOMY/ INSERTION GLAUCOMA SETON X2/ LASER  03-24-2009   RECURRENT HEMORRHAGE/ OCCLUSION INTERNAL SETON  . SHOULDER ARTHROSCOPY Right 2005  . undescended right testicle removed  1994    Family History  Problem Relation Age of Onset  . Glaucoma Mother   . Diabetes Mother   . Stomach cancer Maternal Grandfather   . Stroke Maternal Grandmother        Current Outpatient Medications:  .  amLODipine (NORVASC) 10 MG tablet, TAKE 1 TABLET BY MOUTH EVERY DAY, Disp: 90 tablet, Rfl: 0 .  aspirin EC 81 MG tablet, Take 81 mg by mouth daily., Disp: , Rfl:  .  atorvastatin (LIPITOR) 10 MG tablet, Take 10 mg by mouth daily., Disp: , Rfl:  .  benzonatate (TESSALON) 100 MG capsule, Take 1 capsule (100 mg total) by mouth 2 (two) times daily as needed for cough., Disp: 20 capsule, Rfl: 0 .  Besifloxacin HCl (BESIVANCE) 0.6 % SUSP, Place 1 drop into the left eye 3 (three) times daily. For 2 weeks., Disp: , Rfl:  .  blood glucose meter kit and supplies, Dispense based on Insurance. Test blood sugars once daily. Dx: E10.9, Disp: 1 each, Rfl: 0 .  Blood Glucose Monitoring Suppl (FREESTYLE LITE) DEVI, Please dispense one Freestyle Lite Glucometer, Disp: 1 kit, Rfl: 0 .  buPROPion (WELLBUTRIN SR) 150 MG 12 hr tablet, Take 1 tablet (150 mg total) by mouth 2 (two) times daily., Disp: 180  tablet, Rfl: 1 .  DUREZOL 0.05 % EMUL, Place 1 drop into the left eye See admin instructions. Instill one drop in the left eye three times daily for 1 week, then twice daily for 1 week, then one time daily for 1 week then stop., Disp: , Rfl:  .  gabapentin (NEURONTIN) 300 MG capsule, TAKE 1 CAPSULE (300 MG TOTAL) BY MOUTH AT BEDTIME., Disp: 90 capsule, Rfl: 1 .  glucose blood (FREESTYLE LITE) test strip, Test once daily dx E10.9, Disp: 100 each, Rfl: 3 .  HYDROcodone-acetaminophen (NORCO/VICODIN) 5-325 MG tablet, Take 1-2 tablets by mouth every 6 (six)  hours as needed for moderate pain., Disp: 10 tablet, Rfl: 0 .  Insulin Glargine (BASAGLAR KWIKPEN) 100 UNIT/ML SOPN, INJECT 20 UNITS INTO THE SKIN DAILY -THIS REPLACES LANTUS-, Disp: 15 mL, Rfl: 0 .  Insulin Pen Needle (B-D ULTRAFINE III SHORT PEN) 31G X 8 MM MISC, USE WITH LANUTS PEN, Disp: 100 each, Rfl: 3 .  Lancets (FREESTYLE) lancets, Use as instructed, Disp: 100 each, Rfl: 3 .  losartan-hydrochlorothiazide (HYZAAR) 100-25 MG tablet, Take 1 tablet by mouth daily., Disp: 90 tablet, Rfl: 3 .  metFORMIN (GLUCOPHAGE) 1000 MG tablet, TAKE 1 TABLET (1,000 MG TOTAL) BY MOUTH 2 (TWO) TIMES DAILY WITH A MEAL., Disp: 180 tablet, Rfl: 0 .  metoprolol tartrate (LOPRESSOR) 50 MG tablet, TAKE 1 TABLET BY MOUTH TWICE A DAY (Patient taking differently: Take 50 mg by mouth 2 (two) times daily. ), Disp: 180 tablet, Rfl: 0 .  predniSONE (DELTASONE) 10 MG tablet, Take 4 tablets (40 mg total) by mouth daily., Disp: 20 tablet, Rfl: 0 .  PROLENSA 0.07 % SOLN, Place 1 drop into the left eye at bedtime., Disp: , Rfl:  .  tiZANidine (ZANAFLEX) 2 MG tablet, Take 1 tablet (2 mg total) by mouth at bedtime., Disp: 90 tablet, Rfl: 1  EXAM:  VITALS per patient if applicable:  GENERAL: alert, oriented, appears well and in no acute distress  HEENT: atraumatic, conjunttiva clear, no obvious abnormalities on inspection of external nose and ears  NECK: normal movements of the head and neck  LUNGS: on inspection no signs of respiratory distress, breathing rate appears normal, no obvious gross SOB, gasping or wheezing  CV: no obvious cyanosis  MS: moves all visible extremities without noticeable abnormality  PSYCH/NEURO: pleasant and cooperative, no obvious depression or anxiety, speech and thought processing grossly intact  ASSESSMENT AND PLAN:  Discussed the following assessment and plan: 1. Type 2 diabetes mellitus with diabetic neuropathy, with long-term current use of insulin (HCC) -A1c has improved to 7.7.  We  had a long discussion about foods that he can and cannot eat.  He was advised for example when he goes out to eat and gets a hamburger that he should eat the meat not the bun stay away from [place] fries.  Substituting soda with water. - Continue with current medication therapy -Have him follow-up in 3 months for recheck  I discussed the assessment and treatment plan with the patient. The patient was provided an opportunity to ask questions and all were answered. The patient agreed with the plan and demonstrated an understanding of the instructions.   The patient was advised to call back or seek an in-person evaluation if the symptoms worsen or if the condition fails to improve as anticipated.   [name], NP

## 2019-06-08 ENCOUNTER — Other Ambulatory Visit: Payer: Self-pay | Admitting: Adult Health

## 2019-06-08 MED ORDER — GABAPENTIN 300 MG PO CAPS: 300.0000 mg | ORAL_CAPSULE | Freq: Every day | ORAL | 1 refills | Status: DC

## 2019-06-08 NOTE — Telephone Encounter (Signed)
gabapentin (NEURONTIN) 300 MG capsule  [id number] [place], [place] - [place]. [phone] (Phone) [phone] (Fax)   pls refill

## 2019-06-11 ENCOUNTER — Other Ambulatory Visit: Payer: Self-pay | Admitting: Adult Health

## 2019-06-11 DIAGNOSIS — I10 Essential (primary) hypertension: Secondary | ICD-10-CM

## 2019-06-12 NOTE — Telephone Encounter (Signed)
Sent to the pharmacy by e-scribe. 

## 2019-06-19 ENCOUNTER — Other Ambulatory Visit: Payer: Self-pay | Admitting: Family Medicine

## 2019-06-19 MED ORDER — ATORVASTATIN CALCIUM 10 MG PO TABS: 10.0000 mg | ORAL_TABLET | Freq: Every day | ORAL | 1 refills | Status: DC

## 2019-07-01 DIAGNOSIS — H3582 Retinal ischemia: Secondary | ICD-10-CM | POA: Diagnosis not present

## 2019-07-01 DIAGNOSIS — H31012 Macula scars of posterior pole (postinflammatory) (post-traumatic), left eye: Secondary | ICD-10-CM | POA: Diagnosis not present

## 2019-07-01 DIAGNOSIS — E113512 Type 2 diabetes mellitus with proliferative diabetic retinopathy with macular edema, left eye: Secondary | ICD-10-CM | POA: Diagnosis not present

## 2019-07-07 ENCOUNTER — Telehealth: Payer: Self-pay

## 2019-07-07 NOTE — Telephone Encounter (Signed)
Copied from [place] [id number]. Topic: General - Other >> Jul 07, 2019  1:41 PM Carolyn Stare wrote: Pt has questions about his insulin Insulin Glargine (BASAGLAR KWIKPEN) 100 UNIT/ML SOPN

## 2019-07-07 NOTE — Telephone Encounter (Signed)
Spoke to the pt.  He will have a new insurance to take effect in Jan.  Lantus will be covered at that time.  Currently taking Basaglar.  Will need to send in a new rx when new insurance becomes effective but nothing to do at this time.  Nothing further needed.

## 2019-07-21 DIAGNOSIS — L84 Corns and callosities: Secondary | ICD-10-CM | POA: Diagnosis not present

## 2019-07-21 DIAGNOSIS — L602 Onychogryphosis: Secondary | ICD-10-CM | POA: Diagnosis not present

## 2019-07-21 DIAGNOSIS — E1351 Other specified diabetes mellitus with diabetic peripheral angiopathy without gangrene: Secondary | ICD-10-CM | POA: Diagnosis not present

## 2019-09-02 ENCOUNTER — Other Ambulatory Visit: Payer: Self-pay | Admitting: Adult Health

## 2019-09-03 NOTE — Telephone Encounter (Signed)
Tried to reach the pt by telephone.  No answer or machine.  Will try again at a later time.

## 2019-09-03 NOTE — Telephone Encounter (Signed)
He should be back on combo pill but please make sure

## 2019-09-23 ENCOUNTER — Telehealth: Payer: Self-pay | Admitting: *Deleted

## 2019-09-23 ENCOUNTER — Other Ambulatory Visit: Payer: Self-pay | Admitting: Adult Health

## 2019-09-23 DIAGNOSIS — E1140 Type 2 diabetes mellitus with diabetic neuropathy, unspecified: Secondary | ICD-10-CM

## 2019-09-23 NOTE — Telephone Encounter (Signed)
Medication Refill - Medication: Insulin Glargine (BASAGLAR KWIKPEN) 100 UNIT/ML SOPN  Has the patient contacted their pharmacy? No - states that insurance will not cover [place] and will only cover Lantus (Agent: If no, request that the patient contact the pharmacy for the refill.) (Agent: If yes, when and what did the pharmacy advise?)  Preferred Pharmacy (with phone number or street name):  [id number] [place], [place]. Phone:  [phone]  Fax:  [phone]     Agent: Please be advised that RX refills may take up to 3 business days. We ask that you follow-up with your pharmacy.

## 2019-09-23 NOTE — Telephone Encounter (Signed)
[place] for vaccination

## 2019-09-23 NOTE — Telephone Encounter (Signed)
Ok for vaccine 

## 2019-09-23 NOTE — Telephone Encounter (Signed)
Copied from [place] [phone]. Topic: General - Inquiry >> Sep 23, 2019  9:14 AM Russell Austin wrote: Reason for CRM:   Pt wants to know if PCP would recommend that he get COVID vaccination

## 2019-09-24 ENCOUNTER — Other Ambulatory Visit: Payer: Self-pay

## 2019-09-24 MED ORDER — INSULIN GLARGINE 100 UNIT/ML ~~LOC~~ SOLN: 20.0000 [IU] | Freq: Every day | SUBCUTANEOUS | 11 refills | Status: DC

## 2019-09-24 NOTE — Telephone Encounter (Signed)
Ok to switch to Lantus, same dose as Engineer, [employer]

## 2019-09-24 NOTE — Telephone Encounter (Signed)
Spoke to pt and advise of update.   Pt stated that his insurance no longer covers the [place] but stated they covers Lantus. Pt wants to know if its okay to change insulin.

## 2019-09-24 NOTE — Telephone Encounter (Signed)
Noted  

## 2019-09-29 ENCOUNTER — Other Ambulatory Visit: Payer: Self-pay | Admitting: *Deleted

## 2019-09-29 ENCOUNTER — Telehealth: Payer: Self-pay | Admitting: Adult Health

## 2019-09-29 DIAGNOSIS — I10 Essential (primary) hypertension: Secondary | ICD-10-CM

## 2019-09-29 DIAGNOSIS — Z794 Long term (current) use of insulin: Secondary | ICD-10-CM

## 2019-09-29 DIAGNOSIS — E1140 Type 2 diabetes mellitus with diabetic neuropathy, unspecified: Secondary | ICD-10-CM

## 2019-09-29 MED ORDER — BD PEN NEEDLE SHORT U/F 31G X 8 MM MISC: 3 refills | Status: DC

## 2019-09-29 MED ORDER — LOSARTAN POTASSIUM-HCTZ 100-25 MG PO TABS: 1.0000 | ORAL_TABLET | Freq: Every day | ORAL | 3 refills | Status: DC

## 2019-09-29 NOTE — Telephone Encounter (Signed)
Pt [facility] syringes [facility]     [id number] [place], [place]. Phone:  [phone]  Fax:  [phone]

## 2019-09-29 NOTE — Telephone Encounter (Signed)
Rx sent to the pharmacy for needles.

## 2019-09-29 NOTE — Telephone Encounter (Signed)
Rx sent to the pharmacy as requested for pen needles

## 2019-09-29 NOTE — Telephone Encounter (Signed)
Pt need syringes sent   [id number] [place], [place]. Phone:  [phone]  Fax:  [phone]

## 2019-09-30 NOTE — Telephone Encounter (Signed)
Noted  

## 2019-10-05 ENCOUNTER — Other Ambulatory Visit: Payer: Self-pay | Admitting: Adult Health

## 2019-10-05 NOTE — Telephone Encounter (Signed)
Patient called in needing syringes for the Lantuss insulin that is in a valve.  We are giving him samples for the needles.  The patient is also wanting to know if Lantuss comes in a pen because he prefers pen instead of valve.  Patient would also like to know why he was changed from pen to valve.  Please Advise

## 2019-10-06 DIAGNOSIS — E113512 Type 2 diabetes mellitus with proliferative diabetic retinopathy with macular edema, left eye: Secondary | ICD-10-CM | POA: Diagnosis not present

## 2019-10-15 DIAGNOSIS — H40012 Open angle with borderline findings, low risk, left eye: Secondary | ICD-10-CM | POA: Diagnosis not present

## 2019-10-15 DIAGNOSIS — Z961 Presence of intraocular lens: Secondary | ICD-10-CM | POA: Diagnosis not present

## 2019-10-15 DIAGNOSIS — E113312 Type 2 diabetes mellitus with moderate nonproliferative diabetic retinopathy with macular edema, left eye: Secondary | ICD-10-CM | POA: Diagnosis not present

## 2019-10-15 LAB — HM DIABETES EYE EXAM

## 2019-11-03 DIAGNOSIS — E113512 Type 2 diabetes mellitus with proliferative diabetic retinopathy with macular edema, left eye: Secondary | ICD-10-CM | POA: Diagnosis not present

## 2019-11-10 ENCOUNTER — Other Ambulatory Visit: Payer: Self-pay

## 2019-11-11 ENCOUNTER — Other Ambulatory Visit: Payer: Self-pay | Admitting: Family Medicine

## 2019-11-11 ENCOUNTER — Ambulatory Visit (INDEPENDENT_AMBULATORY_CARE_PROVIDER_SITE_OTHER): Payer: HMO | Admitting: Adult Health

## 2019-11-11 ENCOUNTER — Encounter: Payer: Self-pay | Admitting: Adult Health

## 2019-11-11 VITALS — BP 150/88 | Temp 97.7°F | Ht 67.5 in | Wt 184.0 lb

## 2019-11-11 DIAGNOSIS — E1140 Type 2 diabetes mellitus with diabetic neuropathy, unspecified: Secondary | ICD-10-CM | POA: Diagnosis not present

## 2019-11-11 DIAGNOSIS — G8191 Hemiplegia, unspecified affecting right dominant side: Secondary | ICD-10-CM

## 2019-11-11 DIAGNOSIS — I10 Essential (primary) hypertension: Secondary | ICD-10-CM

## 2019-11-11 DIAGNOSIS — Z794 Long term (current) use of insulin: Secondary | ICD-10-CM

## 2019-11-11 DIAGNOSIS — E782 Mixed hyperlipidemia: Secondary | ICD-10-CM

## 2019-11-11 DIAGNOSIS — Z23 Encounter for immunization: Secondary | ICD-10-CM | POA: Diagnosis not present

## 2019-11-11 DIAGNOSIS — N400 Enlarged prostate without lower urinary tract symptoms: Secondary | ICD-10-CM

## 2019-11-11 DIAGNOSIS — N289 Disorder of kidney and ureter, unspecified: Secondary | ICD-10-CM

## 2019-11-11 LAB — CBC WITH DIFFERENTIAL/PLATELET
Basophils Absolute: 0 10*3/uL (ref 0.0–0.1)
Basophils Relative: 0.8 % (ref 0.0–3.0)
Eosinophils Absolute: 0.1 10*3/uL (ref 0.0–0.7)
Eosinophils Relative: 2.4 % (ref 0.0–5.0)
HCT: 34.9 % — ABNORMAL LOW (ref 39.0–52.0)
Hemoglobin: 11.7 g/dL — ABNORMAL LOW (ref 13.0–17.0)
Lymphocytes Relative: 29.8 % (ref 12.0–46.0)
Lymphs Abs: 1.2 10*3/uL (ref 0.7–4.0)
MCHC: 33.4 g/dL (ref 30.0–36.0)
MCV: 91.4 fl (ref 78.0–100.0)
Monocytes Absolute: 0.3 10*3/uL (ref 0.1–1.0)
Monocytes Relative: 8.1 % (ref 3.0–12.0)
Neutro Abs: 2.5 10*3/uL (ref 1.4–7.7)
Neutrophils Relative %: 58.9 % (ref 43.0–77.0)
Platelets: 270 10*3/uL (ref 150.0–400.0)
RBC: 3.82 Mil/uL — ABNORMAL LOW (ref 4.22–5.81)
RDW: 13.1 % (ref 11.5–15.5)
WBC: 4.2 10*3/uL (ref 4.0–10.5)

## 2019-11-11 LAB — LIPID PANEL
Cholesterol: 115 mg/dL (ref 0–200)
HDL: 35 mg/dL — ABNORMAL LOW (ref >39.00)
LDL Cholesterol: 60 mg/dL (ref 0–99)
NonHDL: 79.83
Total CHOL/HDL Ratio: 3
Triglycerides: 100 mg/dL (ref 0.0–149.0)
VLDL: 20 mg/dL (ref 0.0–40.0)

## 2019-11-11 LAB — COMPREHENSIVE METABOLIC PANEL
ALT: 17 U/L (ref 0–53)
AST: 16 U/L (ref 0–37)
Albumin: 3.4 g/dL — ABNORMAL LOW (ref 3.5–5.2)
Alkaline Phosphatase: 110 U/L (ref 39–117)
BUN: 31 mg/dL — ABNORMAL HIGH (ref 6–23)
CO2: 28 mEq/L (ref 19–32)
Calcium: 8.7 mg/dL (ref 8.4–10.5)
Chloride: 107 mEq/L (ref 96–112)
Creatinine, Ser: 2.07 mg/dL — ABNORMAL HIGH (ref 0.40–1.50)
GFR: 38.81 mL/min — ABNORMAL LOW (ref >60.00)
Glucose, Bld: 196 mg/dL — ABNORMAL HIGH (ref 70–99)
Potassium: 4.7 mEq/L (ref 3.5–5.1)
Sodium: 139 mEq/L (ref 135–145)
Total Bilirubin: 1 mg/dL (ref 0.2–1.2)
Total Protein: 5.8 g/dL — ABNORMAL LOW (ref 6.0–8.3)

## 2019-11-11 LAB — HEMOGLOBIN A1C: Hgb A1c MFr Bld: 8.1 % — ABNORMAL HIGH (ref 4.6–6.5)

## 2019-11-11 LAB — TSH: TSH: 3.47 u[IU]/mL (ref 0.35–4.50)

## 2019-11-11 LAB — PSA: PSA: 0.75 ng/mL (ref 0.10–4.00)

## 2019-11-11 MED ORDER — LANTUS SOLOSTAR 100 UNIT/ML ~~LOC~~ SOPN: 35.0000 [IU] | PEN_INJECTOR | Freq: Every day | SUBCUTANEOUS | 0 refills | Status: DC

## 2019-11-11 MED ORDER — INSULIN GLARGINE 100 UNITS/ML SOLOSTAR PEN: 35.0000 [IU] | PEN_INJECTOR | Freq: Every day | SUBCUTANEOUS | 0 refills | Status: DC

## 2019-11-11 NOTE — Progress Notes (Signed)
 Subjective:    Patient ID: Russell Austin, male    DOB: 11/18/1950, 68 y.o.   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  DM -he is currently prescribed lantus 20 units daily and Metformin 1000 mg twice daily.  He is unable to exercise frequently due to neuropathy and weakness from CVA in the past.  Unfortunately his diet continues to consist of carbs and sugars.  He denies hypoglycemic events.  When he was last seen in September 2020 his A1c had improved from 8.4-7.7. Lab Results  Component Value Date   HGBA1C 7.7 (H) 05/29/2019   Essential Hypertension -currently prescribed Norvasc 10 mg, Hyzaar 100-25 mg, and metoprolol 50 mg twice daily.  He denies dizziness, lightheadedness, chest pain, or shortness of breath. He did not take any of his medications this morning. He does not monitor his BP at home but has a cuff.  BP Readings from Last 3 Encounters:  11/12/18 (!) 158/70  10/30/18 (!) 150/80  10/28/18 (!) 144/73   Hyperlipidemia -takes statin and aspirin 324 mg daily.  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  He is prescribed a muscle relaxer and gabapentin for spastic hemiparesis.  His spasticity interferes with ADLs, mobility, and hygiene.  Depression- was prescribed Wellbutrin in the past but stopped taking this because " I didn't want to take it anymore."  He denies depressive symptoms today   All immunizations and health maintenance protocols were reviewed with the patient and needed orders were placed. He is due for PPSV 23.   Appropriate screening laboratory values were ordered for the patient including screening of hyperlipidemia, renal function and hepatic function. If indicated by BPH, a PSA was ordered.  Medication reconciliation,  past medical history, social history, problem list and allergies were reviewed in detail with the patient  Goals were established with regard to weight loss, exercise, and  diet in compliance with medications Wt Readings from Last 3 Encounters:  11/11/19 184 lb (83.5 kg)  10/30/18 184 lb (83.5 kg)  10/15/18 179 lb (81.2 kg)   He is up-to-date on routine screening colonoscopies.   Review of Systems  Constitutional: Negative.   HENT: Negative.   Eyes: Negative.   Respiratory: Negative.   Cardiovascular: Negative.   Gastrointestinal: Negative.   Endocrine: Negative.   Genitourinary: Negative.   Musculoskeletal: Positive for gait problem.  Skin: Negative.   Allergic/Immunologic: Negative.   Hematological: Negative.   Psychiatric/Behavioral: Negative.   All other systems reviewed and are negative.  Past Medical History:  Diagnosis Date  . 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 Diabetes mellitus type II   . Diabetic retinopathy FOLLOWED BY DR RANKIN  . ED (erectile dysfunction)   . Glaucoma of both eyes   . Hypertension   . Left hydrocele   . Stroke (HCC)     Social History   Socioeconomic   History  . Marital status: Married    Spouse name: Not on file  . Number of children: 1  . Years of education: 88  . Highest education level: Not on file  Occupational History  . Occupation: Disabled  Tobacco Use  . Smoking status: Never Smoker  . Smokeless tobacco: Never Used  Substance and Sexual Activity  . Alcohol use: No  . Drug use: No  . Sexual activity: Not on file    Other Topics Concern  . Not on file  Social History Narrative   Lives at home with his wife and granddaughter.   Left-handed.   3 cups caffeine per day.   Social Determinants of Health   Financial Resource Strain:   . Difficulty of Paying Living Expenses: Not on file  Food Insecurity:   . Worried About Charity fundraiser in the Last Year: Not on file  . Ran Out of Food in the Last Year: Not on file  Transportation Needs:   . Lack of Transportation (Medical): Not on file  . Lack of Transportation (Non-Medical): Not on file  Physical Activity:   . Days of Exercise per Week: Not on file  . Minutes of Exercise per Session: Not on file  Stress:   . Feeling of Stress : Not on file  Social Connections:   . Frequency of Communication with Friends and Family: Not on file  . Frequency of Social Gatherings with Friends and Family: Not on file  . Attends Religious Services: Not on file  . Active Member of Clubs or Organizations: Not on file  . Attends Archivist Meetings: Not on file  . Marital Status: Not on file  Intimate Partner Violence:   . Fear of Current or Ex-Partner: Not on file  . Emotionally Abused: Not on file  . Physically Abused: Not on file  . Sexually Abused: Not on file    Past Surgical History:  Procedure Laterality Date  . APPENDECTOMY  AGE EARLY 20'S  . HYDROCELE EXCISION  03/31/2012   Procedure: HYDROCELECTOMY ADULT;  Surgeon: Franchot Gallo, MD;  Location: [facility];  Service: Urology;  Laterality: Left;  45 MINS    . LEFT EYE LASER RETINA REPAIR  SEPT 2012  . RIGHT EYE VITRECTOMY/ INSERTION GLAUCOMA SETON/ LASER REPAIR  12-13-2008   RETINAL ARTERY OCCLUSION /NEOVASCULAR GLAUCOMA/ HEMORRHAGE  . RIGHT EYE VITRETOMY/ INSERTION GLAUCOMA SETON X2/ LASER  03-24-2009   RECURRENT HEMORRHAGE/ OCCLUSION INTERNAL SETON  . SHOULDER ARTHROSCOPY Right 2005  . undescended right testicle removed  1994    Family History  Problem Relation  Age of Onset  . Glaucoma Mother   . Diabetes Mother   . Stomach cancer Maternal Grandfather   . Stroke Maternal Grandmother     Allergies  Allergen Reactions  . Lactose Intolerance (Gi) Diarrhea  . Apple Pectin [Pectin] Itching    ITCHY THROAT  . Peach [Prunus Persica] Itching    ITCHY THROAT  . Morphine And Related Anxiety    Jittery    Current Outpatient Medications on File Prior to Visit  Medication Sig Dispense Refill  . amLODipine (NORVASC) 10 MG tablet TAKE 1 TABLET BY MOUTH EVERY DAY 90 tablet 1  . aspirin EC 81 MG tablet Take 81 mg by mouth daily.    Marland Kitchen atorvastatin (LIPITOR) 10 MG tablet Take 1 tablet (10 mg total) by mouth daily. 90 tablet 1  . blood glucose meter kit and supplies Dispense based on Insurance. Test blood  sugars once daily. Dx: E10.9 1 each 0  . Blood Glucose Monitoring Suppl (FREESTYLE LITE) DEVI Please dispense one Freestyle Lite Glucometer 1 kit 0  . gabapentin (NEURONTIN) 300 MG capsule Take 1 capsule (300 mg total) by mouth at bedtime. 90 capsule 1  . glucose blood (FREESTYLE LITE) test strip Test once daily dx E10.9 100 each 3  . hydrochlorothiazide (HYDRODIURIL) 25 MG tablet TAKE 1 TABLET BY MOUTH DAILY WITH LOSARTAN POTASSIUM 100MG (COMBINATION TAB NOT AVAILABLE) 90 tablet 3  . insulin glargine (LANTUS) 100 UNIT/ML injection Inject 0.2 mLs (20 Units total) into the skin daily. 10 mL 11  . Insulin Pen Needle (B-D ULTRAFINE III SHORT PEN) 31G X 8 MM MISC USE WITH LANUTS PEN 100 each 3  . Lancets (FREESTYLE) lancets Use as instructed 100 each 3  . losartan (COZAAR) 100 MG tablet TAKE 1 TABLET BY MOUTH DAILY WITH HCTZ (COMBINATION TABLET NOT AVAILABLE) 90 tablet 3  . metoprolol tartrate (LOPRESSOR) 50 MG tablet TAKE 1 TABLET BY MOUTH TWICE A DAY (Patient taking differently: Take 50 mg by mouth 2 (two) times daily. ) 180 tablet 0  . tiZANidine (ZANAFLEX) 2 MG tablet Take 1 tablet (2 mg total) by mouth at bedtime. 90 tablet 1  . buPROPion (WELLBUTRIN SR)  150 MG 12 hr tablet Take 1 tablet (150 mg total) by mouth 2 (two) times daily. 180 tablet 1  . metFORMIN (GLUCOPHAGE) 1000 MG tablet TAKE 1 TABLET (1,000 MG TOTAL) BY MOUTH 2 (TWO) TIMES DAILY WITH A MEAL. 180 tablet 0   No current facility-administered medications on file prior to visit.    BP (!) 150/88   Temp 97.7 F (36.5 C) (Temporal)   Ht 5' 7.5" (1.715 m)   Wt 184 lb (83.5 kg)   BMI 28.39 kg/m       Objective:   Physical Exam Vitals and nursing note reviewed.  Constitutional:      General: He is not in acute distress.    Appearance: Normal appearance. He is well-developed and normal weight.  HENT:     Head: Normocephalic and atraumatic.     Right Ear: Tympanic membrane, ear canal and external ear normal. There is no impacted cerumen.     Left Ear: Tympanic membrane, ear canal and external ear normal. There is no impacted cerumen.     Nose: Nose normal. No congestion or rhinorrhea.     Mouth/Throat:     Mouth: Mucous membranes are moist.     Pharynx: Oropharynx is clear. No oropharyngeal exudate or posterior oropharyngeal erythema.  Eyes:     General:        Right eye: No discharge.        Left eye: No discharge.     Extraocular Movements: Extraocular movements intact.     Conjunctiva/sclera: Conjunctivae normal.     Pupils: Pupils are equal, round, and reactive to light.  Neck:     Vascular: No carotid bruit.     Trachea: No tracheal deviation.  Cardiovascular:     Rate and Rhythm: Normal rate and regular rhythm.     Pulses: Normal pulses.     Heart sounds: Normal heart sounds. No murmur. No friction rub. No gallop.   Pulmonary:     Effort: Pulmonary effort is normal. No respiratory distress.     Breath sounds: Normal breath sounds. No stridor. No wheezing, rhonchi or rales.  Chest:     Chest wall: No tenderness.  Abdominal:     General:  Bowel sounds are normal. There is no distension.     Palpations: Abdomen is soft. There is no mass.     Tenderness: There  is no abdominal tenderness. There is no right CVA tenderness, left CVA tenderness, guarding or rebound.     Hernia: No hernia is present.  Musculoskeletal:        General: No swelling, tenderness, deformity or signs of injury. Normal range of motion.     Right lower leg: No edema.     Left lower leg: No edema.  Lymphadenopathy:     Cervical: No cervical adenopathy.  Skin:    General: Skin is warm and dry.     Capillary Refill: Capillary refill takes less than 2 seconds.     Coloration: Skin is not jaundiced or pale.     Findings: No bruising, erythema, lesion or rash.  Neurological:     General: No focal deficit present.     Mental Status: He is alert and oriented to person, place, and time.     Cranial Nerves: No cranial nerve deficit.     Sensory: No sensory deficit.     Motor: Weakness and abnormal muscle tone present.     Coordination: Coordination normal.     Gait: Gait normal.     Deep Tendon Reflexes: Reflexes normal.     Comments: Spasticity noted throughout right side. 5/5 grip strength in right upper extremity 5/5 grip strength in left upper extremity    Psychiatric:        Mood and Affect: Mood normal.        Behavior: Behavior normal.        Thought Content: Thought content normal.        Judgment: Judgment normal.       Assessment & Plan:  1. Type 2 diabetes mellitus with diabetic neuropathy, with long-term current use of insulin (HCC) - Consider increase in insulin.  - Follow up in three months  - CBC with Differential/Platelet - Comprehensive metabolic panel - Hemoglobin A1c - Lipid panel - TSH  2. Essential hypertension - Will have his wife check blood pressure BID and follow up if elevated - CBC with Differential/Platelet - Comprehensive metabolic panel - Hemoglobin A1c - Lipid panel - TSH  3. Right hemiparesis ([place]) - Encouraged home PT exercises  - Continue with Gabapentin and muscle relaxer - CBC with Differential/Platelet - Comprehensive  metabolic panel - Hemoglobin A1c - Lipid panel - TSH  4. Benign prostatic hyperplasia without lower urinary tract symptoms  - PSA  5. Mixed hyperlipidemia - Continue with statin, consider increase - CBC with Differential/Platelet - Comprehensive metabolic panel - Hemoglobin A1c - Lipid panel - TSH  [name], NP

## 2019-11-11 NOTE — Patient Instructions (Signed)
It was great seeing you today!   I would like you to really concentrate on a heart healthy diet.   We will follow up with you regarding your blood work and make any changes to medications as that time.   Please follow up in three months

## 2019-11-11 NOTE — Addendum Note (Signed)
Addended by: Miles Costain T on: 11/11/2019 04:16 PM   Modules accepted: Orders

## 2019-11-19 ENCOUNTER — Telehealth: Payer: Self-pay | Admitting: Adult Health

## 2019-11-19 ENCOUNTER — Other Ambulatory Visit: Payer: Self-pay | Admitting: Adult Health

## 2019-11-19 NOTE — Telephone Encounter (Signed)
Medication Refill: hydroCHLOROthiazide 25 MG   Pharmacy:[id number] - [place],  - [address]  PEJYL:[phone]

## 2019-11-19 NOTE — Telephone Encounter (Signed)
Russell Austin,  Your last note says the pt is taking furosemide.  That medication has not been filled in quite some time.  HCTZ has been filled most recently.  Should the pt be on furosemide?

## 2019-11-19 NOTE — Telephone Encounter (Signed)
Sent to the pharmacy as directed by [name] (see telephone encounter).  Nothing further needed.

## 2019-11-19 NOTE — Telephone Encounter (Signed)
I thought he was taking Lasix but you are right, it looks like it has not been filled in some time. He likely does not need it. Ok to send in HCTZ x 1 year

## 2019-11-19 NOTE — Telephone Encounter (Signed)
Sent to the pharmacy by e-scribe.  See refill encounter.

## 2019-11-24 DIAGNOSIS — E113512 Type 2 diabetes mellitus with proliferative diabetic retinopathy with macular edema, left eye: Secondary | ICD-10-CM | POA: Diagnosis not present

## 2019-11-30 ENCOUNTER — Other Ambulatory Visit: Payer: Self-pay | Admitting: General Practice

## 2019-11-30 NOTE — Patient Outreach (Signed)
Client is newly enrolled in the Special Needs Plan program with Type II Diabetes, Hgb A1C is elevated at 8.1 % this month, up from 7.7 % six months ago. Individualized Care Plan (ICP) completed with information from the  Health Risk Assessment on file. Client also has a history of Stroke, Hypertension and Hyperlipidemia. Will send an introductory letter with ICP to the primary provider and client, along with educational materials. No ED or acute inpatient visits in 2020.  Assigned RN Care Coordinator will follow up within 3 months.

## 2019-12-07 ENCOUNTER — Other Ambulatory Visit: Payer: Self-pay | Admitting: Adult Health

## 2019-12-09 NOTE — Telephone Encounter (Signed)
PT IS NO LONGER USING THIS MEDICATION.

## 2019-12-20 ENCOUNTER — Other Ambulatory Visit: Payer: Self-pay | Admitting: Adult Health

## 2019-12-29 ENCOUNTER — Other Ambulatory Visit: Payer: Self-pay | Admitting: *Deleted

## 2019-12-29 NOTE — Patient Outreach (Signed)
  [place] [facility]) Care Management Chronic Special Needs Program    12/29/2019  Name: Russell Austin, DOB: 08-19-1951  MRN: [id number]   [name] is enrolled in a chronic special needs plan for Diabetes. Outreach call placed to client for initial telephone assessment, no answer to telephone, left voicemail requesting return phone call.  PLAN Outreach client in 4-5 weeks  Russell Austin [facility], [place] Coordinator [phone]

## 2019-12-30 ENCOUNTER — Other Ambulatory Visit: Payer: Self-pay | Admitting: *Deleted

## 2019-12-30 ENCOUNTER — Encounter: Payer: Self-pay | Admitting: *Deleted

## 2019-12-30 NOTE — Patient Outreach (Signed)
[place] [facility]) Care Management Chronic Special Needs Program  12/30/2019  Name: Russell Austin DOB: 05-03-1951  MRN: [id number]  Russell Austin is enrolled in a chronic special needs plan for Diabetes. Chronic Care Management Coordinator telephoned client to review health risk assessment and to develop individualized care plan.  Introduced the chronic care management program, importance of client participation, and taking their care plan to all provider appointments and inpatient facilities.  Reviewed the transition of care process and possible referral to community care management.  Subjective: Client reports he lives with wife Russell Austin and is independent in all aspects of his care except driving, reports spouse transports him to all appointments, etc.  Client reports he checks CBG usually once daily (sometimes twice) with ranges fasting 106-150, reports he exercises at gym 3 x per week and "trying to good with my food".  Client states he has been considering getting a continuous glucose monitor Freestyle Libre and intends to speak with his doctor about this.  Client is still interested in information being mailed to his home for Advanced directives.  Client verbalizes understanding of actions to take if CBG drops below 70, client states " this may happen once every few weeks and I eat or drink something, then it's alright"  Client reports he is in process of being referred to nephrologist.  Client states he did receive HTA calendar and 24 hour nurse line magnet and has HTA conciegre number.  Goals Addressed            This Visit's Progress   .  Acknowledge receipt of Building surveyor mailed client Advanced Directives packet. Please call RN care manager if you have any questions     . Client understands the importance of follow-up with providers by attending scheduled visits   On track    Review of medical record indicates client has  completed 3 office visits with primary care provider in 2020 and one office visit in 2021 Call and schedule a yearly physical and follow up visit as recommended by your health care provider.     . Client will not report change from baseline and no repeated symptoms of stroke with in the next 12 months        Call the [place] (HTA) Nurse Advice Line [phone] for any health related concerns or call 911 if having emergency type symptoms.  Continue to take your medications as ordered.     . Client will verbalize knowledge of self management of Hypertension as evidences by BP reading of 140/90 or less; or as defined by provider       Take blood pressure medications as prescribed. Plan to check blood pressure regularly and take results to your health care provider appointments. Plan to follow a low salt diet. Increase activity as tolerated. Follow up with your health care provider as recommended. Please ask your health care provider " what is my target blood pressure range". Blood pressure 150/88 on 11/11/19 RN care manager mailed EMMI education article " High Blood pressure (hypertension): What you can do"    . HEMOGLOBIN A1C < 7.0       Per medical record review Hgb AIC completed on 11/11/19   8.1 Diabetes self management actions as follows- Continue to keep your follow up appointments with your provider and have lab work completed as recommended. Monitor glucose (blood sugar) per health care provider recommendation. Check feet daily Visit  health care provider every 3-6 months as directed. It is important to have your Hgb AIC checked every 3-6 months (every 6 months if you are at goal and every 3 months if you are not at goal). Eye exam yearly Carbohydrate controlled meal planning Take diabetes medications as prescribed by health care provider Physical activity- keep up the good work with going to the gym RN care manager mailed EMMI education article " Diabetes and  Diet" Please follow up with your doctor about continuous glucose monitor Freestyle Libre Call HTA conciegre for any benefit questions at [phone]      . Maintain timely refills of diabetic medication as prescribed within the year .       Take medications as prescribed. Follow up with your health care provider if you have any questions. Please contact your assigned RN care manager if you have difficulty obtaining medications. Review of electronic medical record medication dispense report indicates client maintains timely refills of diabetic medications.     . Obtain annual  Lipid Profile, LDL-C   On track    Per medical record review, Lipid profile completed on 11/11/19 LDL= 60 The goal for LDL is less than 70mg /dl as you are at high risk for complications. Try to avoid saturated fats, trans-fats and eat more fiber. Continue to follow up with your health care provider for any needed lab work.     . Obtain Annual Eye (retinal)  Exam    On track    Per medical record review, client had eye exam on 10/15/19 Plan to have dilated eye exam every year Plan to schedule your eye exam yearly     . Obtain Annual Foot Exam   On track    Client states provider checks feet 2 times per year. Per medical record review, foot exam completed by podiatrist on 05/12/19 Your doctor should check your feet at least once a year. Plan to schedule a foot exam with your health care provider once every year. Check your skin and feet daily for cuts, bruises, redness, blisters or sore.     . Obtain annual screen for micro albuminuria (urine) , nephropathy (kidney problems)   On track    Per client report, microalbuminuria completed in March 2021 Continue to obtain yearly physicals and lab checks as recommended by your health care provider. It is important for your health care provider to check your urine for protein at least once every year.     . Obtain Hemoglobin A1C at least 2 times per year   On track     Client reports [facility] checked twice yearly Continue to follow up with primary care provider for any needed/ recommended lab work    . Visit Primary Care Provider or Endocrinologist at least 2 times per year        Client saw primary care provider 3 times in 2020 and 1 time in 2021 Continue to follow up with primary care provider as needed       Plan:  RN care manager faxed today's note with individualized care plan to primary care provider, included in note that client is requesting [place] and plans to discuss with primary care provider.  RN care manager mailed individualized care plan with EMMI education articles to client along with consent form and advanced directives packet.  Chronic care management coordination will outreach in:  10-12 months  Jacqlyn Larsen [facility], [place] Coordinator [phone]       Kassie Mends  Nursing/RN Coord [facility] Case Manager, C-SNP

## 2020-01-07 ENCOUNTER — Telehealth: Payer: Self-pay | Admitting: Adult Health

## 2020-01-07 NOTE — Chronic Care Management (AMB) (Signed)
°  Chronic Care Management   Note  01/07/2020 Name: Russell Austin MRN: [id number] DOB: March 28, 1951  Russell Austin is a 69 y.o. year old male who is a primary care patient of [name], NP. I reached out to [employer] by phone today in response to a referral sent by Russell Austin's PCP, [name], NP.   [name] was given information about Chronic Care Management services today including:  1. CCM service includes personalized support from designated clinical staff supervised by his physician, including individualized plan of care and coordination with other care providers 2. 24/7 contact phone numbers for assistance for urgent and routine care needs. 3. Service will only be billed when office clinical staff spend 20 minutes or more in a month to coordinate care. 4. Only one practitioner may furnish and bill the service in a calendar month. 5. The patient may stop CCM services at any time (effective at the end of the month) by phone call to the office staff.   Patient agreed to services and verbal consent obtained.   Follow up plan:   [place]

## 2020-01-27 DIAGNOSIS — E113512 Type 2 diabetes mellitus with proliferative diabetic retinopathy with macular edema, left eye: Secondary | ICD-10-CM | POA: Diagnosis not present

## 2020-01-29 ENCOUNTER — Ambulatory Visit: Payer: HMO | Admitting: *Deleted

## 2020-02-08 NOTE — Chronic Care Management (AMB) (Signed)
Chronic Care Management Pharmacy  Name: Russell Austin  MRN: [id number] DOB: Mar 24, 1951  Initial Questions: 1. Have you seen any other providers since your last visit? NA 2. Any changes in your medicines or health? No   Chief Complaint/ HPI  Russell Austin,  69 y.o. , male presents for their Initial CCM visit with the clinical pharmacist via telephone due to COVID-19 Pandemic.  PCP : Russell Peng, NP  Their chronic conditions include: HTN, DM, HLD, History of cerebral infarction, CKD stage 2, Right hemiparesis (spastic)    Patient reports he would like to come off medications. He reports focusing on exercise- he goes to the gym three days per week. Patient stated he would like to address primary issues with Russell Austin, but is opened to follow up after his visit this week to help address goals.   Office Visits: 11/11/2019- Russell Peng, NP- Patient presented for office visit for follow up. Consider increase in insulin. Spouse to check BP BID and follow up if elevated.  Labs ordered: CBC, CMP, A1c, lipid panel, and TSH.   Medications: Outpatient Encounter Medications as of 02/09/2020  Medication Sig Note  . aspirin EC 81 MG tablet Take 81 mg by mouth daily.   Marland Kitchen atorvastatin (LIPITOR) 10 MG tablet TAKE 1 TABLET BY MOUTH EVERY DAY   . blood glucose meter kit and supplies Dispense based on Insurance. Test blood sugars once daily. Dx: E10.9   . Blood Glucose Monitoring Suppl (FREESTYLE LITE) DEVI Please dispense one Freestyle Lite Glucometer   . buPROPion (WELLBUTRIN SR) 150 MG 12 hr tablet Take 1 tablet (150 mg total) by mouth 2 (two) times daily.   Marland Kitchen gabapentin (NEURONTIN) 300 MG capsule Take 1 capsule (300 mg total) by mouth at bedtime.   Marland Kitchen glucose blood (FREESTYLE LITE) test strip Test once daily dx E10.9   . hydrochlorothiazide (HYDRODIURIL) 25 MG tablet TAKE 1 TABLET BY MOUTH DAILY WITH LOSARTAN POTASSIUM 100MG (COMBINATION TAB NOT AVAILABLE)   . insulin glargine (LANTUS  SOLOSTAR) 100 UNIT/ML Solostar Pen Inject 35 Units into the skin daily. (Patient taking differently: Inject 20 Units into the skin daily. )   . Insulin Pen Needle (B-D ULTRAFINE III SHORT PEN) 31G X 8 MM MISC USE WITH LANUTS PEN   . Lancets (FREESTYLE) lancets Use as instructed   . losartan (COZAAR) 100 MG tablet TAKE 1 TABLET BY MOUTH DAILY WITH HCTZ (COMBINATION TABLET NOT AVAILABLE)   . metFORMIN (GLUCOPHAGE) 1000 MG tablet TAKE 1 TABLET (1,000 MG TOTAL) BY MOUTH 2 (TWO) TIMES DAILY WITH A MEAL.   . metoprolol tartrate (LOPRESSOR) 50 MG tablet TAKE 1 TABLET BY MOUTH TWICE A DAY   . tiZANidine (ZANAFLEX) 2 MG tablet Take 1 tablet (2 mg total) by mouth at bedtime.   Marland Kitchen amLODipine (NORVASC) 10 MG tablet TAKE 1 TABLET BY MOUTH EVERY DAY (Patient not taking: Reported on 02/09/2020) 02/09/2020: Patient did  not have current supply.    No facility-administered encounter medications on file as of 02/09/2020.     Current Diagnosis/Assessment:  Goals Addressed            This Visit's Progress   . Pharmacy Care Plan       CARE PLAN ENTRY  Current Barriers:  . Chronic Disease Management support, education, and care coordination needs related to Hypertension, Hyperlipidemia, Diabetes, Chronic Kidney Disease, and Right hemiparesis (spastic)   Hypertension (high blood pressure) . Pharmacist Clinical Goal(s): o Over the next 30 days, patient will work with  PharmD and providers to maintain BP goal <140/90 . Current regimen:   Amlodipine 62m, 1 tablet once daily (unclear if taking)   Hydrochlorothiazide 2104m 1 tablet once daily with Losartan  Losartan 10072m1 tablet once daily with HCTZ  Metoprolol tartrate 1m65m tablet twice daily  . Interventions: o Discussed importance of adherence.  . Patient self care activities - Over the next 30 days, patient will: o Check BP daily, document, and provide at future appointments o Ensure daily salt intake < 2300 mg/day o Double check current supply of  amlodipine.   Hyperlipidemia (high cholesterol) . Pharmacist Clinical Goal(s): o Over the next 90 days, patient will work with PharmD and providers to maintain LDL goal < 70 . Current regimen:  o Atorvastatin 10mg74mtablet once daily  . Interventions: o We discussed how a diet high in plant sterols (fruits/vegetables/nuts/whole grains/legumes) may reduce your cholesterol.  Encouraged increasing fiber to a daily intake of 10-25g/day  . Patient self care activities - Over the next 90 days, patient will: o Continue current medications as directed.  Diabetes . Pharmacist Clinical Goal(s): o Over the next 30 days, patient will work with PharmD and providers to achieve A1c goal <7% . Current regimen:   Insulin glargine (Lantus Solostar), inject 20 units into skin daily  Metformin 1000mg,49mablet twice daily with a meal . Interventions: o Recommend dose decrease of metformin due to kidney function.  . Patient self care activities - Over the next 30 days, patient will: o Check blood sugar daily, document, and provide at future appointments o Contact provider with any episodes of hypoglycemia  Right hemiparesis (spastic) . Pharmacist Clinical Goal(s) o Over the next 90 days, patient will work with PharmD and providers to minimize symptoms.  . Current regimen:   Gabapentin 300mg, 71mpsule at bedtime   Tizanidine 2mg, 1 67mlet at bedtime  . Patient self care activities - Over the next 90 days, patient will: o Continue current medications.   Chronic kidney disease  . Pharmacist Clinical Goal(s) o Over the next 30 days, patient will follow up with nephrologist (kidney doctor).  . Patient self care activities o Patient will wait for call to schedule visit from:  - [facility] - [address] [address]: [phone] management . Pharmacist Clinical Goal(s): o Over the next 30 days, patient will work with  PharmD and providers to achieve optimal medication adherence . [facility] . Interventions o Comprehensive medication review performed. o Continue current medication management strategy . Patient self care activities - Over the next 30 days, patient will: o Focus on medication adherence by double checking current supply of amlodipine and requesting refill.  o Take medications as prescribed o Report any questions or concerns to PharmD and/or provider(s)  Initial goal documentation       SDOH Interventions     Most Recent Value  SDOH Interventions  Financial Strain Interventions  Intervention Not Indicated  Transportation Interventions  Intervention Not Indicated [Patient states spouse helps with transportation.]      Diabetes   Recent Relevant Labs: Lab Results  Component Value Date/Time   HGBA1C 8.1 (H) 11/11/2019 07:44 AM   HGBA1C 7.7 (H) 05/29/2019 02:50 PM   HGBA1C 6.6 07/23/2018 09:00 AM   HGBA1C 7.2 (A) 04/29/2018 07:15 AM   MICROALBUR 50.8 (H) 12/13/2014 08:01 AM   MICROALBUR 27.7 (H) 12/16/2013 09:38 AM  Checking BG: Daily  Recent FBG Readings:patient reported <200. Has not had tested this AM yet.   Patient has failed these meds in past: glipizide   Patient is currently uncontrolled on the following medications:   Insulin glargine (Lantus Solostar), inject 20 units into skin daily  Metformin 1014m, 1 tablet twice daily with a meal  Last diabetic Eye exam:  Lab Results  Component Value Date/Time   HMDIABEYEEXA Retinopathy (A) 08/21/2018 12:00 AM    Last diabetic Foot exam:  Lab Results  Component Value Date/Time   HMDIABFOOTEX done 02/02/2010 12:00 AM     We discussed: diet and exercise extensively and how to recognize and treat signs of hypoglycemia   Plan Recommend decrease of metformin to 5011mtwice daily due to kidney function.   [facility] 1 month [facility]    Hypertension   Office blood pressures  are  BP Readings from Last 3 Encounters:  11/11/19 (!) 150/88  11/12/18 (!) 158/70  10/30/18 (!) 150/80    Patient has failed these meds in the past: lisinopril   Patient checks BP at home daily   Patient home BP readings are ranging: patient unable to report recent readings. States within normal levels. Spouse helps him check his BP.   Patient is currently on: (unable to assess control)   Amlodipine 1079m1 tablet once daily (has not been taking)   Hydrochlorothiazide 1m68m tablet once daily with Losartan  Losartan 100mg41mtablet once daily with HCTZ  Metoprolol tartrate 50mg,63mablet twice daily   We discussed: - adherence: patient did not mention amlodipine and was not able to find current supply. Patient mentioned he is supposed to take it.   Plan Patient to double check supply and [facility].  Continue current medications   Hyperlipidemia/ hx of CVA   Lipid Panel     Component Value Date/Time   CHOL 115 11/11/2019 0744   TRIG 100.0 11/11/2019 0744   HDL 35.00 (L) 11/11/2019 0744   LDLCALC 60 11/11/2019 0744     The ASCVD Risk score (Goff DC Jr., et al., 2013) failed to calculate for the following reasons:   The valid total cholesterol range is 130 to 320 mg/dL   Patient has failed these meds in past:  Patient is currently controlled on the following medications:  . Atorvastatin 10mg, 43mblet once daily  . Aspirin 81mg, 151mlet once daily  We discussed:  diet and exercise extensively . We discussed how a diet high in plant sterols (fruits/vegetables/nuts/whole grains/legumes) may reduce your cholesterol.  Encouraged increasing fiber to a daily intake of 10-25g/day  Plan Continue current medications  Right hemiparesis (spastic)    Patient is currently controlled on the following medications:   Gabapentin 300mg, 1 40mule at bedtime   Tizanidine 2mg, 1 ta76mt at bedtime   Plan Continue current  medications  Depression (query)  Patient stated he is still taking bupropion,but will stop taking when he finishes his supply.  Denies depression symptoms. He stated he is not sure why this was added, but does not see any need for it.   Patient is currently on the following medications:  . Bupropion (Wellbutrin SR) 150mg, 1 ta71m twice times daily  We discussed:  - recommended against abrupt discontinuation; patient prefers to [facility] in [facility] days.   Plan Continue current medications   CKD, Stage 3  Patient referred  to nephrologist.   Referral note:   referral [facility]  [facility] Address: [address] [place], [place] [address] Phone: [phone] Fax [phone] Their office will contact pt to schedule directly - 3-6 months for review / will rate  The patient on a numeric scale.   Kidney Function Lab Results  Component Value Date/Time   CREATININE 2.07 (H) 11/11/2019 07:44 AM   CREATININE 1.99 (H) 10/30/2018 07:56 AM   GFR 38.81 (L) 11/11/2019 07:44 AM   GFRNONAA 41 (L) 10/28/2018 09:12 AM   GFRAA 47 (L) 10/28/2018 09:12 AM    Plan Continue to monitor and adjust medications as needed.     Medication Management  Patient organizes medications: patient states he has them in a pill pack.  [facility]  Adherence: no gaps in refills history for other meds.  --- amlodipine (no recent fill  (per medication dispense history from 08/12/2019 to 01/07/2020). Patient to check supply and request refill as appropriate.    Follow-up Follow up visit with PharmD in 1 month.   Anson Crofts, PharmD Clinical Pharmacist [facility] [phone]

## 2020-02-09 ENCOUNTER — Telehealth: Payer: HMO

## 2020-02-09 ENCOUNTER — Ambulatory Visit: Payer: HMO

## 2020-02-09 ENCOUNTER — Other Ambulatory Visit: Payer: Self-pay

## 2020-02-09 ENCOUNTER — Other Ambulatory Visit: Payer: Self-pay | Admitting: Adult Health

## 2020-02-09 DIAGNOSIS — N182 Chronic kidney disease, stage 2 (mild): Secondary | ICD-10-CM

## 2020-02-09 DIAGNOSIS — Z794 Long term (current) use of insulin: Secondary | ICD-10-CM

## 2020-02-09 DIAGNOSIS — I10 Essential (primary) hypertension: Secondary | ICD-10-CM

## 2020-02-09 DIAGNOSIS — E1140 Type 2 diabetes mellitus with diabetic neuropathy, unspecified: Secondary | ICD-10-CM

## 2020-02-09 DIAGNOSIS — E782 Mixed hyperlipidemia: Secondary | ICD-10-CM

## 2020-02-09 DIAGNOSIS — G8191 Hemiplegia, unspecified affecting right dominant side: Secondary | ICD-10-CM

## 2020-02-09 NOTE — Patient Instructions (Addendum)
Visit Information  Goals Addressed            This Visit's Progress   . Pharmacy Care Plan       CARE PLAN ENTRY  Current Barriers:  . Chronic Disease Management support, education, and care coordination needs related to Hypertension, Hyperlipidemia, Diabetes, Chronic Kidney Disease, and Right hemiparesis (spastic)   Hypertension (high blood pressure) . Pharmacist Clinical Goal(s): o Over the next 30 days, patient will work with PharmD and providers to maintain BP goal <140/90 . Current regimen:   Amlodipine 10mg , 1 tablet once daily (unclear if taking)   Hydrochlorothiazide 25mg , 1 tablet once daily with Losartan  Losartan 100mg , 1 tablet once daily with HCTZ  Metoprolol tartrate 50mg , 1 tablet twice daily  . Interventions: o Discussed importance of adherence.  . Patient self care activities - Over the next 30 days, patient will: o Check BP daily, document, and provide at future appointments o Ensure daily salt intake < 2300 mg/day o Double check current supply of amlodipine.   Hyperlipidemia (high cholesterol) . Pharmacist Clinical Goal(s): o Over the next 90 days, patient will work with PharmD and providers to maintain LDL goal < 70 . Current regimen:  o Atorvastatin 10mg , 1 tablet once daily  . Interventions: o We discussed how a diet high in plant sterols (fruits/vegetables/nuts/whole grains/legumes) may reduce your cholesterol.  Encouraged increasing fiber to a daily intake of 10-25g/day  . Patient self care activities - Over the next 90 days, patient will: o Continue current medications as directed.  Diabetes . Pharmacist Clinical Goal(s): o Over the next 30 days, patient will work with PharmD and providers to achieve A1c goal <7% . Current regimen:   Insulin glargine (Lantus Solostar), inject 20 units into skin daily  Metformin 1000mg , 1 tablet twice daily with a meal . Interventions: o Recommend dose decrease of metformin due to kidney function.   . Patient self care activities - Over the next 30 days, patient will: o Check blood sugar daily, document, and provide at future appointments o Contact provider with any episodes of hypoglycemia  Right hemiparesis (spastic) . Pharmacist Clinical Goal(s) o Over the next 90 days, patient will work with PharmD and providers to minimize symptoms.  . Current regimen:   Gabapentin 300mg , 1 capsule at bedtime   Tizanidine 2mg , 1 tablet at bedtime  . Patient self care activities - Over the next 90 days, patient will: o Continue current medications.   Chronic kidney disease  . Pharmacist Clinical Goal(s) o Over the next 30 days, patient will follow up with nephrologist (kidney doctor).  . Patient self care activities o Patient will wait for call to schedule visit from:  - [facility] - Address: [address] [place], [place] [address] - Phone: [phone] - Fax [phone]  Medication management . Pharmacist Clinical Goal(s): o Over the next 30 days, patient will work with PharmD and providers to achieve optimal medication adherence . [facility] . Interventions o Comprehensive medication review performed. o Continue current medication management strategy . Patient self care activities - Over the next 30 days, patient will: o Focus on medication adherence by double checking current supply of amlodipine and requesting refill.  o Take medications as prescribed o Report any questions or concerns to PharmD and/or provider(s)  Initial goal documentation        Mr. Koch was given information about Chronic Care Management services today including:  1. CCM service includes personalized support from  designated clinical staff supervised by his physician, including individualized plan of care and coordination with other care providers 2. 24/7 contact phone numbers for assistance for urgent and routine care needs. 3. Standard insurance, coinsurance,  copays and deductibles apply for chronic care management only during months in which we provide at least 20 minutes of these services. Most insurances cover these services at 100%, however patients may be responsible for any copay, coinsurance and/or deductible if applicable. This service may help you avoid the need for more expensive face-to-face services. 4. Only one practitioner may furnish and bill the service in a calendar month. 5. The patient may stop CCM services at any time (effective at the end of the month) by phone call to the office staff.  Patient agreed to services and verbal consent obtained.   The patient verbalized understanding of instructions provided today and agreed to receive a mailed copy of patient instruction and/or educational materials. Telephone follow up appointment with pharmacy team member scheduled for: 03/11/2020  Anson Crofts, PharmD Clinical Pharmacist [facility] [phone]     Managing Your Hypertension Hypertension is commonly called high blood pressure. This is when the force of your blood pressing against the walls of your arteries is too strong. Arteries are blood vessels that carry blood from your heart throughout your body. Hypertension forces the heart to work harder to pump blood, and may cause the arteries to become narrow or stiff. Having untreated or uncontrolled hypertension can cause heart attack, stroke, kidney disease, and other problems. What are blood pressure readings? A blood pressure reading consists of a higher number over a lower number. Ideally, your blood pressure should be below 120/80. The first ("top") number is called the systolic pressure. It is a measure of the pressure in your arteries as your heart beats. The second ("bottom") number is called the diastolic pressure. It is a measure of the pressure in your arteries as the heart relaxes. What does my blood pressure reading mean? Blood pressure  is classified into four stages. Based on your blood pressure reading, your health care provider may use the following stages to determine what type of treatment you need, if any. Systolic pressure and diastolic pressure are measured in a unit called mm Hg. Normal  Systolic pressure: below 425.  Diastolic pressure: below 80. Elevated  Systolic pressure: 956-387.  Diastolic pressure: below 80. Hypertension stage 1  Systolic pressure: 564-332.  Diastolic pressure: 95-18. Hypertension stage 2  Systolic pressure: 841 or above.  Diastolic pressure: 90 or above. What health risks are associated with hypertension? Managing your hypertension is an important responsibility. Uncontrolled hypertension can lead to:  A heart attack.  A stroke.  A weakened blood vessel (aneurysm).  Heart failure.  Kidney damage.  Eye damage.  Metabolic syndrome.  Memory and concentration problems. What changes can I make to manage my hypertension? Hypertension can be managed by making lifestyle changes and possibly by taking medicines. Your health care provider will help you make a plan to bring your blood pressure within a normal range. Eating and drinking   Eat a diet that is high in fiber and potassium, and low in salt (sodium), added sugar, and fat. An example eating plan is called the DASH (Dietary Approaches to Stop Hypertension) diet. To eat this way: ? Eat plenty of fresh fruits and vegetables. Try to fill half of your plate at each meal with fruits and vegetables. ? Eat whole grains, such as whole wheat  pasta, brown rice, or whole grain bread. Fill about one quarter of your plate with whole grains. ? Eat low-fat diary products. ? Avoid fatty cuts of meat, processed or cured meats, and poultry with skin. Fill about one quarter of your plate with lean proteins such as fish, chicken without skin, beans, eggs, and tofu. ? Avoid premade and processed foods. These tend to be higher in sodium,  added sugar, and fat.  Reduce your daily sodium intake. Most people with hypertension should eat less than 1,500 mg of sodium a day.  Limit alcohol intake to no more than 1 drink a day for nonpregnant women and 2 drinks a day for men. One drink equals 12 oz of beer, 5 oz of wine, or 1 oz of hard liquor. Lifestyle  Work with your health care provider to maintain a healthy body weight, or to lose weight. Ask what an ideal weight is for you.  Get at least 30 minutes of exercise that causes your heart to beat faster (aerobic exercise) most days of the week. Activities may include walking, swimming, or biking.  Include exercise to strengthen your muscles (resistance exercise), such as weight lifting, as part of your weekly exercise routine. Try to do these types of exercises for 30 minutes at least 3 days a week.  Do not use any products that contain nicotine or tobacco, such as cigarettes and e-cigarettes. If you need help quitting, ask your health care provider.  Control any long-term (chronic) conditions you have, such as high cholesterol or diabetes. Monitoring  Monitor your blood pressure at home as told by your health care provider. Your personal target blood pressure may vary depending on your medical conditions, your age, and other factors.  Have your blood pressure checked regularly, as often as told by your health care provider. Working with your health care provider  Review all the medicines you take with your health care provider because there may be side effects or interactions.  Talk with your health care provider about your diet, exercise habits, and other lifestyle factors that may be contributing to hypertension.  Visit your health care provider regularly. Your health care provider can help you create and adjust your plan for managing hypertension. Will I need medicine to control my blood pressure? Your health care provider may prescribe medicine if lifestyle changes are not  enough to get your blood pressure under control, and if:  Your systolic blood pressure is 130 or higher.  Your diastolic blood pressure is 80 or higher. Take medicines only as told by your health care provider. Follow the directions carefully. Blood pressure medicines must be taken as prescribed. The medicine does not work as well when you skip doses. Skipping doses also puts you at risk for problems. Contact a health care provider if:  You think you are having a reaction to medicines you have taken.  You have repeated (recurrent) headaches.  You feel dizzy.  You have swelling in your ankles.  You have trouble with your vision. Get help right away if:  You develop a severe headache or confusion.  You have unusual weakness or numbness, or you feel faint.  You have severe pain in your chest or abdomen.  You vomit repeatedly.  You have trouble breathing. Summary  Hypertension is when the force of blood pumping through your arteries is too strong. If this condition is not controlled, it may put you at risk for serious complications.  Your personal target blood pressure may vary depending  on your medical conditions, your age, and other factors. For most people, a normal blood pressure is less than 120/80.  Hypertension is managed by lifestyle changes, medicines, or both. Lifestyle changes include weight loss, eating a healthy, low-sodium diet, exercising more, and limiting alcohol. This information is not intended to replace advice given to you by your health care provider. Make sure you discuss any questions you have with your health care provider. Document Revised: 12/12/2018 Document Reviewed: 07/18/2016 Elsevier Patient Education  [place].

## 2020-02-10 ENCOUNTER — Other Ambulatory Visit: Payer: Self-pay

## 2020-02-11 ENCOUNTER — Encounter: Payer: Self-pay | Admitting: Adult Health

## 2020-02-11 ENCOUNTER — Ambulatory Visit (INDEPENDENT_AMBULATORY_CARE_PROVIDER_SITE_OTHER): Payer: HMO | Admitting: Adult Health

## 2020-02-11 VITALS — BP 166/92 | Temp 97.6°F | Wt 181.0 lb

## 2020-02-11 DIAGNOSIS — Z794 Long term (current) use of insulin: Secondary | ICD-10-CM | POA: Diagnosis not present

## 2020-02-11 DIAGNOSIS — N289 Disorder of kidney and ureter, unspecified: Secondary | ICD-10-CM

## 2020-02-11 DIAGNOSIS — I10 Essential (primary) hypertension: Secondary | ICD-10-CM

## 2020-02-11 DIAGNOSIS — E1140 Type 2 diabetes mellitus with diabetic neuropathy, unspecified: Secondary | ICD-10-CM | POA: Diagnosis not present

## 2020-02-11 LAB — POCT GLYCOSYLATED HEMOGLOBIN (HGB A1C): HbA1c, POC (controlled diabetic range): 7.3 % — AB (ref 0.0–7.0)

## 2020-02-11 MED ORDER — FREESTYLE LIBRE 14 DAY READER DEVI: 0 refills | Status: DC

## 2020-02-11 MED ORDER — AMLODIPINE BESYLATE 10 MG PO TABS: 10.0000 mg | ORAL_TABLET | Freq: Every day | ORAL | 1 refills | Status: DC

## 2020-02-11 MED ORDER — FREESTYLE LIBRE 14 DAY SENSOR MISC: 2 refills | Status: DC

## 2020-02-11 MED ORDER — GLIPIZIDE ER 2.5 MG PO TB24: 2.5000 mg | ORAL_TABLET | Freq: Two times a day (BID) | ORAL | 0 refills | Status: DC

## 2020-02-11 MED ORDER — LANTUS SOLOSTAR 100 UNIT/ML ~~LOC~~ SOPN: 26.0000 [IU] | PEN_INJECTOR | Freq: Every day | SUBCUTANEOUS | 0 refills | Status: DC

## 2020-02-11 MED ORDER — METOPROLOL TARTRATE 50 MG PO TABS: 50.0000 mg | ORAL_TABLET | Freq: Two times a day (BID) | ORAL | 3 refills | Status: DC

## 2020-02-11 NOTE — Patient Instructions (Signed)
I am going to discontinue metformin and start you on glipizide 2.5 mg twice a day   Your A1c has improved to 7.3   Follow up in 3 months   I have sent in the [facility] system - let me know if insurance does not cover this

## 2020-02-11 NOTE — Progress Notes (Signed)
Subjective:    Patient ID: Russell Austin, male    DOB: 1951-03-02, 69 y.o.   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  He presents to the office today for follow-up regarding diabetes mellitus.  He is currently prescribed Metformin 1000 mg twice daily and Lantus 35 units nightly.  He does not check his blood sugars at home on a routine basis.  He is unable to exercise frequently due to neuropathy and weakness from a CVA in the past.  During his last visit in March his A1c had increased from 7.7-8.1.  At this time his Lantus had increased from 20 units to  26 units.  He denies episodes of hypoglycemia.  His diet continues to be an issue with him eating sweets and foods high in carbohydrates.  He is interested in using the freestyle libre system to monitor his blood sugars  Lab Results  Component Value Date   HGBA1C 7.3 (A) 02/11/2020   He was also referred to nephrology at this time for decreased kidney function.  He has not had his appointment yet but reports that they are supposed to call him this month to schedule something for early fall.  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   Diabetes mellitus type II    Diabetic retinopathy FOLLOWED BY DR Zadie Rhine   ED (erectile dysfunction)    Glaucoma of both eyes    Hypertension    Left hydrocele    Stroke [facility])     Social History   Socioeconomic History   Marital status: Married    Spouse name: Not on file   Number of children: 1   Years of education: 12   Highest education level: Not on file  Occupational History   Occupation: Disabled  Tobacco Use   Smoking status:  Never Smoker   Smokeless tobacco: Never Used  Substance and Sexual Activity   Alcohol use: No   Drug use: No   Sexual activity: Not on file  Other Topics Concern   Not on file  Social History Narrative   Lives at home with his wife and granddaughter.   Left-handed.   3 cups caffeine per day.   Social Determinants of Health   Financial Resource Strain: Low Risk    Difficulty of Paying Living Expenses: Not hard at all  Food Insecurity: No Food Insecurity   Worried About Charity fundraiser in the Last Year: Never true   [place] in the Last Year: Never true  Transportation Needs: No Transportation Needs   Lack of Transportation (Medical): No   Lack of Transportation (Non-Medical): No  Physical Activity:    Days of Exercise per Week:    Minutes of Exercise per Session:   Stress:    Feeling of Stress :   Social Connections:    Frequency of Communication with Friends and Family:    Frequency of Social Gatherings with Friends and Family:    Attends Religious Services:    Active Member of Clubs or Organizations:    Attends Archivist Meetings:    Marital Status:   Intimate Partner Violence:    Fear  of Current or Ex-Partner:    Emotionally Abused:    Physically Abused:    Sexually Abused:     Past Surgical History:  Procedure Laterality Date   APPENDECTOMY  AGE EARLY 20'S   HYDROCELE EXCISION  03/31/2012   Procedure: HYDROCELECTOMY ADULT;  Surgeon: Franchot Gallo, MD;  [facility];  Service: Urology;  Laterality: Left;  82 MINS     LEFT EYE LASER RETINA REPAIR  SEPT 2012   RIGHT EYE VITRECTOMY/ INSERTION GLAUCOMA SETON/ LASER REPAIR  12-13-2008   RETINAL ARTERY OCCLUSION /NEOVASCULAR GLAUCOMA/ HEMORRHAGE   RIGHT EYE VITRETOMY/ INSERTION GLAUCOMA SETON X2/ LASER  03-24-2009   RECURRENT HEMORRHAGE/ OCCLUSION INTERNAL SETON   SHOULDER ARTHROSCOPY Right 2005   undescended right testicle removed  1994      Family History  Problem Relation Age of Onset   Glaucoma Mother    Diabetes Mother    Stomach cancer Maternal Grandfather    Stroke Maternal Grandmother     Allergies  Allergen Reactions   Lactose Intolerance (Gi) Diarrhea   Apple Pectin [Pectin] Itching    ITCHY THROAT   Peach [Prunus Persica] Itching    ITCHY THROAT   Morphine And Related Anxiety    Jittery    Current Outpatient Medications on File Prior to Visit  Medication Sig Dispense Refill   aspirin EC 81 MG tablet Take 81 mg by mouth daily.     atorvastatin (LIPITOR) 10 MG tablet TAKE 1 TABLET BY MOUTH EVERY DAY 90 tablet 3   blood glucose meter kit and supplies Dispense based on Insurance. Test blood sugars once daily. Dx: E10.9 1 each 0   Blood Glucose Monitoring Suppl (FREESTYLE LITE) DEVI Please dispense one Freestyle Lite Glucometer 1 kit 0   gabapentin (NEURONTIN) 300 MG capsule Take 1 capsule (300 mg total) by mouth at bedtime. 90 capsule 1   glucose blood (FREESTYLE LITE) test strip Test once daily dx E10.9 100 each 3   hydrochlorothiazide (HYDRODIURIL) 25 MG tablet TAKE 1 TABLET BY MOUTH DAILY WITH LOSARTAN POTASSIUM 100MG (COMBINATION TAB NOT AVAILABLE) 90 tablet 3   Insulin Pen Needle (B-D ULTRAFINE III SHORT PEN) 31G X 8 MM MISC USE WITH LANUTS PEN 100 each 3   Lancets (FREESTYLE) lancets Use as instructed 100 each 3   losartan (COZAAR) 100 MG tablet TAKE 1 TABLET BY MOUTH DAILY WITH HCTZ (COMBINATION TABLET NOT AVAILABLE) 90 tablet 3   tiZANidine (ZANAFLEX) 2 MG tablet Take 1 tablet (2 mg total) by mouth at bedtime. 90 tablet 1   buPROPion (WELLBUTRIN SR) 150 MG 12 hr tablet Take 1 tablet (150 mg total) by mouth 2 (two) times daily. 180 tablet 1   No current facility-administered medications on file prior to visit.    BP (!) 166/92 Comment: NO MEDS   Temp 97.6 F (36.4 C)    Wt 181 lb (82.1 kg)    BMI 27.93 kg/m       Objective:   Physical Exam Vitals and nursing note  reviewed.  Constitutional:      Appearance: Normal appearance.  Musculoskeletal:        General: Normal range of motion.  Skin:    General: Skin is warm and dry.     Capillary Refill: Capillary refill takes less than 2 seconds.  Neurological:     General: No focal deficit present.     Mental Status: He is alert and oriented to person, place, and time.  Psychiatric:  Mood and Affect: Mood normal.        Behavior: Behavior normal.        Thought Content: Thought content normal.        Judgment: Judgment normal.       Assessment & Plan:  1. Type 2 diabetes mellitus with diabetic neuropathy, with long-term current use of insulin (HCC)  - POCT A1C- 7.3 - has improved but not at goal yet.   -I am going to DC Metformin completely due to decreased kidney function start him on glipizide 2.5 mg extended release twice daily.  Continue with Lantus 26 units.  Needs to work on diet and exercise. - Follow up in three months  - glipiZIDE (GLUCOTROL XL) 2.5 MG 24 hr tablet; Take 1 tablet (2.5 mg total) by mouth in the morning and at bedtime.  Dispense: 180 tablet; Refill: 0 - insulin glargine (LANTUS SOLOSTAR) 100 UNIT/ML Solostar Pen; Inject 26 Units into the skin daily.  Dispense: 10 pen; Refill: 0 - Continuous Blood Gluc Receiver (FREESTYLE LIBRE 14 DAY READER) DEVI; Use with freestyle libre system  Dispense: 1 each; Refill: 0 - Continuous Blood Gluc Sensor (FREESTYLE LIBRE 14 DAY SENSOR) MISC; Change sensor every 2 weeks  Dispense: 2 each; Refill: 2  2. Essential hypertension - med refills. He did not take his medications this morning  - amLODipine (NORVASC) 10 MG tablet; Take 1 tablet (10 mg total) by mouth daily.  Dispense: 90 tablet; Refill: 1 - metoprolol tartrate (LOPRESSOR) 50 MG tablet; Take 1 tablet (50 mg total) by mouth 2 (two) times daily.  Dispense: 180 tablet; Refill: 3  3. Function kidney decreased - D/c Metformin  - Follow up in 3 months   [name], NP

## 2020-03-10 DIAGNOSIS — J3089 Other allergic rhinitis: Secondary | ICD-10-CM | POA: Diagnosis not present

## 2020-03-10 DIAGNOSIS — J4540 Moderate persistent asthma, uncomplicated: Secondary | ICD-10-CM | POA: Diagnosis not present

## 2020-03-11 ENCOUNTER — Ambulatory Visit: Payer: HMO

## 2020-03-11 ENCOUNTER — Other Ambulatory Visit: Payer: Self-pay

## 2020-03-11 ENCOUNTER — Telehealth: Payer: Self-pay

## 2020-03-11 DIAGNOSIS — G8191 Hemiplegia, unspecified affecting right dominant side: Secondary | ICD-10-CM

## 2020-03-11 DIAGNOSIS — I10 Essential (primary) hypertension: Secondary | ICD-10-CM

## 2020-03-11 DIAGNOSIS — Z794 Long term (current) use of insulin: Secondary | ICD-10-CM

## 2020-03-11 DIAGNOSIS — N182 Chronic kidney disease, stage 2 (mild): Secondary | ICD-10-CM

## 2020-03-11 DIAGNOSIS — E782 Mixed hyperlipidemia: Secondary | ICD-10-CM

## 2020-03-11 NOTE — Chronic Care Management (AMB) (Signed)
Chronic Care Management Pharmacy  Name: Russell Austin  MRN: [id number] DOB: 28-Jul-1951  Initial Questions: 1. Have you seen any other providers since your last visit? NA 2. Any changes in your medicines or health? No   Chief Complaint/ HPI  Russell Austin,  69 y.o. , male presents for their Follow-Up CCM visit with the clinical pharmacist via telephone due to COVID-19 Pandemic.  Patient reported doing well overall. He questioned why he was on so many medications for blood pressure. Patient reports currently finishing supply for test strips and then will plan on using Freestyle Libre.   02/09/2019 Patient reports he would like to come off medications. He reports focusing on exercise- he goes to the gym three days per week. Patient stated he would like to address primary issues with Russell Austin, but is opened to follow up after his visit this week to help address goals.   PCP : [name], NP  Their chronic conditions include: HTN, DM, HLD, History of cerebral infarction, CKD stage 2, Right hemiparesis (spastic)    Office Visits: 02/11/2020- [name], NP- Patient presented for office visit for DM follow up. Metformin discontinued due to decreased kidney function. Started on glipizide 2.98m ER twice daily. Continue Lantus 26 units. Patient prescribed Freestyle Libre. Amlodipine and metoprolol were refilled.   11/11/2019- CDorothyann Peng NP- Patient presented for office visit for follow up. Consider increase in insulin. Spouse to check BP BID and follow up if elevated.  Labs ordered: CBC, CMP, A1c, lipid panel, and TSH.   Medications: Outpatient Encounter Medications as of 03/11/2020  Medication Sig  . amLODipine (NORVASC) 10 MG tablet Take 1 tablet (10 mg total) by mouth daily.  .Marland Kitchenaspirin EC 81 MG tablet Take 81 mg by mouth daily.  .Marland Kitchenatorvastatin (LIPITOR) 10 MG tablet TAKE 1 TABLET BY MOUTH EVERY DAY  . blood glucose meter kit and supplies Dispense based on Insurance. Test  blood sugars once daily. Dx: E10.9  . Blood Glucose Monitoring Suppl (FREESTYLE LITE) DEVI Please dispense one Freestyle Lite Glucometer  . Continuous Blood Gluc Receiver (FREESTYLE LIBRE 14 DAY READER) DEVI Use with freestyle libre system  . Continuous Blood Gluc Sensor (FREESTYLE LIBRE 14 DAY SENSOR) MISC Change sensor every 2 weeks  . gabapentin (NEURONTIN) 300 MG capsule Take 1 capsule (300 mg total) by mouth at bedtime.  .Marland KitchenglipiZIDE (GLUCOTROL XL) 2.5 MG 24 hr tablet Take 1 tablet (2.5 mg total) by mouth in the morning and at bedtime.  .Marland Kitchenglucose blood (FREESTYLE LITE) test strip Test once daily dx E10.9  . hydrochlorothiazide (HYDRODIURIL) 25 MG tablet TAKE 1 TABLET BY MOUTH DAILY WITH LOSARTAN POTASSIUM 100MG (COMBINATION TAB NOT AVAILABLE)  . insulin glargine (LANTUS SOLOSTAR) 100 UNIT/ML Solostar Pen Inject 26 Units into the skin daily.  . Insulin Pen Needle (B-D ULTRAFINE III SHORT PEN) 31G X 8 MM MISC USE WITH LANUTS PEN  . Lancets (FREESTYLE) lancets Use as instructed  . losartan (COZAAR) 100 MG tablet TAKE 1 TABLET BY MOUTH DAILY WITH HCTZ (COMBINATION TABLET NOT AVAILABLE)  . metoprolol tartrate (LOPRESSOR) 50 MG tablet Take 1 tablet (50 mg total) by mouth 2 (two) times daily.  .Marland KitchentiZANidine (ZANAFLEX) 2 MG tablet Take 1 tablet (2 mg total) by mouth at bedtime.  .Marland KitchenbuPROPion (WELLBUTRIN SR) 150 MG 12 hr tablet Take 1 tablet (150 mg total) by mouth 2 (two) times daily.   No facility-administered encounter medications on file as of 03/11/2020.     Current Diagnosis/Assessment:  Goals Addressed            This Visit's Progress   . Pharmacy Care Plan       CARE PLAN ENTRY  Current Barriers:  . Chronic Disease Management support, education, and care coordination needs related to Hypertension, Hyperlipidemia, Diabetes, Chronic Kidney Disease, and Right hemiparesis (spastic)   Hypertension (high blood pressure) . Pharmacist Clinical Goal(s): o Over the next 30 days, patient will  work with PharmD and providers to maintain BP goal <140/90 . Current regimen:   Amlodipine 43m, 1 tablet once daily   Hydrochlorothiazide 217m 1 tablet once daily with Losartan  Losartan 10070m1 tablet once daily with HCTZ  Metoprolol tartrate 16m31m tablet twice daily  . Interventions: o Discussed importance of adherence.  . Patient self care activities - Over the next 30 days, patient will: o Check BP daily, document, and provide at future appointments o Ensure daily salt intake < 2300 mg/day  Hyperlipidemia (high cholesterol) . Pharmacist Clinical Goal(s): o Over the next 90 days, patient will work with PharmD and providers to maintain LDL goal < 70 . Current regimen:  o Atorvastatin 10mg86mtablet once daily  . Interventions: o We discussed how a diet high in plant sterols (fruits/vegetables/nuts/whole grains/legumes) may reduce your cholesterol.  Encouraged increasing fiber to a daily intake of 10-25g/day  . Patient self care activities - Over the next 90 days, patient will: o Continue current medications as directed.  Diabetes . Pharmacist Clinical Goal(s): o Over the next 30 days, patient will work with PharmD and providers to achieve A1c goal <7% . Current regimen:   Insulin glargine (Lantus Solostar), inject 20 units into skin daily  Glipizide ER 2.5mg ,62mtablet in the morning and at bedtime . Interventions: o Recommend dose decrease of metformin due to kidney function.  . Patient self care activities - Over the next 30 days, patient will: o Check blood sugar daily, document, and provide at future appointments o Contact provider with any episodes of hypoglycemia  Right hemiparesis (spastic) . Pharmacist Clinical Goal(s) o Over the next 90 days, patient will work with PharmD and providers to minimize symptoms.  . Current regimen:   Gabapentin 300mg, 75mpsule at bedtime   Tizanidine 2mg, 1 74mlet at bedtime  . Patient self care activities - Over the next  90 days, patient will: o Continue current medications.   Chronic kidney disease  . Pharmacist Clinical Goal(s) o Over the next 30 days, patient will follow up with nephrologist (kidney doctor).  . Patient self care activities o Patient to attend visit on 03/31/2020.   Medication management . Pharmacist Clinical Goal(s): o Over the next 30 days, patient will work with PharmD and providers to maintain optimal medication adherence . [facility] . Interventions o Comprehensive medication review performed. o Continue current medication management strategy . Patient self care activities - Over the next 30 days, patient will: o Take medications as prescribed o Report any questions or concerns to PharmD and/or provider(s)  Please see past updates related to this goal by clicking on the "Past Updates" button in the selected goal        SDOH Interventions     Most Recent Value  SDOH Interventions  Transportation Interventions Intervention Not Indicated       Diabetes   Recent Relevant Labs: Lab Results  Component Value Date/Time   HGBA1C 7.3 (A) 02/11/2020 07:09 AM   HGBA1C 8.1 (H) 11/11/2019 07:44 AM  HGBA1C 7.7 (H) 05/29/2019 02:50 PM   HGBA1C 6.6 07/23/2018 09:00 AM   MICROALBUR 50.8 (H) 12/13/2014 08:01 AM   MICROALBUR 27.7 (H) 12/16/2013 09:38 AM    Checking BG: Daily  Recent FBG Readings:FBG: 90-150   Patient has failed these meds in past: glipizide   Patient is currently uncontrolled on the following medications:   Insulin glargine (Lantus Solostar), inject 20 units into skin daily  Glipizide ER 2.88m , 1 tablet in the morning and at bedtime (takes first thing)   Last diabetic Eye exam:  Lab Results  Component Value Date/Time   HMDIABEYEEXA Retinopathy (A) 08/21/2018 12:00 AM    Last diabetic Foot exam:  Lab Results  Component Value Date/Time   HMDIABFOOTEX done 02/02/2010 12:00 AM     We discussed: diet and exercise extensively and  how to recognize and treat signs of hypoglycemia  Plan Follow up in 1 month for DM reassessment.    Hypertension   Office blood pressures are  BP Readings from Last 3 Encounters:  02/11/20 (!) 166/92  11/11/19 (!) 150/88  11/12/18 (!) 158/70   Patient has failed these meds in the past: lisinopril   Patient checks BP at home daily   Patient home BP readings are ranging: 140/60 mmHg (went yesterday to doctor's office)  Patient is currently uncontrolled on:   Amlodipine 160m 1 tablet once daily   Hydrochlorothiazide 2565m1 tablet once daily with Losartan  Losartan 100m35m tablet once daily with HCTZ  Metoprolol tartrate 50mg31mtablet twice daily   We discussed: . Discussed need to continue checking blood pressure at home.  . Discussed diet modifications. DASH diet:  following a diet emphasizing fruits and vegetables and low-fat dairy products along with whole grains, fish, poultry, and nuts. Reducing red meats and sugars.  . Exercising  . Reducing the amount of salt intake to <1500mg/53mday.   Plan Continue current medications   Hyperlipidemia/ hx of CVA   Lipid Panel     Component Value Date/Time   CHOL 115 11/11/2019 0744   TRIG 100.0 11/11/2019 0744   HDL 35.00 (L) 11/11/2019 0744   LDLCALC 60 11/11/2019 0744     The ASCVD Risk score (Goff DC Jr., et al., 2013) failed to calculate for the following reasons:   The valid total cholesterol range is 130 to 320 mg/dL   Patient has failed these meds in past:  Patient is currently controlled on the following medications:  . Atorvastatin 10mg, 4mblet once daily  . Aspirin 81mg, 132mlet once daily  We discussed:  diet and exercise extensively . We discussed how a diet high in plant sterols (fruits/vegetables/nuts/whole grains/legumes) may reduce your cholesterol.  Encouraged increasing fiber to a daily intake of 10-25g/day  Plan Continue current medications  Right hemiparesis (spastic)    Patient is  currently controlled on the following medications:   Gabapentin 300mg, 1 76mule at bedtime   Tizanidine 2mg, 1 ta33mt at bedtime   Plan Continue current medications  Depression (query)  Patient stated he is still taking bupropion,but will stop taking when he finishes his supply.  Denies depression symptoms. He stated he is not sure why this was added, but does not see any need for it.   Patient is currently on the following medications:  . Bupropion (Wellbutrin SR) 150mg, 1 ta41m twice times daily  We discussed:  - recommended against abrupt discontinuation; patient prefers to discuss with Cory at [facility] in 2 days.  Plan Continue current medications   CKD, Stage 3  Patient referred to nephrologist. Patient reports he was contacted by nephrology offie and has appointment scheduled 03/31/20.   Kidney Function Lab Results  Component Value Date/Time   CREATININE 2.07 (H) 11/11/2019 07:44 AM   CREATININE 1.99 (H) 10/30/2018 07:56 AM   GFR 38.81 (L) 11/11/2019 07:44 AM   GFRNONAA 41 (L) 10/28/2018 09:12 AM   GFRAA 47 (L) 10/28/2018 09:12 AM   K 4.7 11/11/2019 07:44 AM   K 5.3 (H) 10/30/2018 07:56 AM   Plan Continue to monitor and adjust medications as needed.     Medication Management  Patient organizes medications: patient states he has them in a pill pack.  [facility]  Adherence: no gaps in refills history (per medication dispense history (09/17/19 to 03/11/20) - since last visit, patient has refilled amlodipine and metoprolol     Follow-up Follow up visit with PharmD in 1 month.    Anson Crofts, PharmD Clinical Pharmacist [facility] [place] [phone]

## 2020-03-11 NOTE — Telephone Encounter (Signed)
  Chronic Care Management   Outreach Note  03/11/2020 Name: Russell Austin MRN: [id number] DOB: [birth year]  Referred by: [name], NP Reason for referral : Chronic Care Management   An unsuccessful telephone outreach was attempted today. The patient was referred to the pharmacist for assistance with care management and care coordination.   Follow Up Plan:  Left message on VM. Re-attempt call within 1 week pending on return call from patient.   Anson Crofts, PharmD Clinical Pharmacist [facility] [place] [phone]

## 2020-03-15 ENCOUNTER — Encounter: Payer: Self-pay | Admitting: Family Medicine

## 2020-03-15 NOTE — Patient Instructions (Signed)
Visit Information  Goals Addressed            This Visit's Progress   . Pharmacy Care Plan       CARE PLAN ENTRY  Current Barriers:  . Chronic Disease Management support, education, and care coordination needs related to Hypertension, Hyperlipidemia, Diabetes, Chronic Kidney Disease, and Right hemiparesis (spastic)   Hypertension (high blood pressure) . Pharmacist Clinical Goal(s): o Over the next 30 days, patient will work with PharmD and providers to maintain BP goal <140/90 . Current regimen:   Amlodipine 10mg , 1 tablet once daily   Hydrochlorothiazide 25mg , 1 tablet once daily with Losartan  Losartan 100mg , 1 tablet once daily with HCTZ  Metoprolol tartrate 50mg , 1 tablet twice daily  . Interventions: o Discussed importance of adherence.  . Patient self care activities - Over the next 30 days, patient will: o Check BP daily, document, and provide at future appointments o Ensure daily salt intake < 2300 mg/day  Hyperlipidemia (high cholesterol) . Pharmacist Clinical Goal(s): o Over the next 90 days, patient will work with PharmD and providers to maintain LDL goal < 70 . Current regimen:  o Atorvastatin 10mg , 1 tablet once daily  . Interventions: o We discussed how a diet high in plant sterols (fruits/vegetables/nuts/whole grains/legumes) may reduce your cholesterol.  Encouraged increasing fiber to a daily intake of 10-25g/day  . Patient self care activities - Over the next 90 days, patient will: o Continue current medications as directed.  Diabetes . Pharmacist Clinical Goal(s): o Over the next 30 days, patient will work with PharmD and providers to achieve A1c goal <7% . Current regimen:   Insulin glargine (Lantus Solostar), inject 20 units into skin daily  Glipizide ER 2.5mg  , 1 tablet in the morning and at bedtime . Interventions: o Recommend dose decrease of metformin due to kidney function.  . Patient self care activities - Over the next 30 days, patient  will: o Check blood sugar daily, document, and provide at future appointments o Contact provider with any episodes of hypoglycemia  Right hemiparesis (spastic) . Pharmacist Clinical Goal(s) o Over the next 90 days, patient will work with PharmD and providers to minimize symptoms.  . Current regimen:   Gabapentin 300mg , 1 capsule at bedtime   Tizanidine 2mg , 1 tablet at bedtime  . Patient self care activities - Over the next 90 days, patient will: o Continue current medications.   Chronic kidney disease  . Pharmacist Clinical Goal(s) o Over the next 30 days, patient will follow up with nephrologist (kidney doctor).  . Patient self care activities o Patient to attend visit on 03/31/2020.   Medication management . Pharmacist Clinical Goal(s): o Over the next 30 days, patient will work with PharmD and providers to maintain optimal medication adherence . [facility] . Interventions o Comprehensive medication review performed. o Continue current medication management strategy . Patient self care activities - Over the next 30 days, patient will: o Take medications as prescribed o Report any questions or concerns to PharmD and/or provider(s)  Please see past updates related to this goal by clicking on the "Past Updates" button in the selected goal         Patient verbalizes understanding of instructions provided today.   Telephone follow up appointment with pharmacy team member scheduled for:  Anson Crofts, PharmD Clinical Pharmacist [facility] [place] [phone]

## 2020-03-21 ENCOUNTER — Encounter: Payer: Self-pay | Admitting: Adult Health

## 2020-03-21 DIAGNOSIS — N1832 Chronic kidney disease, stage 3b: Secondary | ICD-10-CM | POA: Diagnosis not present

## 2020-03-21 DIAGNOSIS — I129 Hypertensive chronic kidney disease with stage 1 through stage 4 chronic kidney disease, or unspecified chronic kidney disease: Secondary | ICD-10-CM | POA: Diagnosis not present

## 2020-03-21 DIAGNOSIS — I639 Cerebral infarction, unspecified: Secondary | ICD-10-CM | POA: Diagnosis not present

## 2020-03-21 DIAGNOSIS — E782 Mixed hyperlipidemia: Secondary | ICD-10-CM | POA: Diagnosis not present

## 2020-03-22 ENCOUNTER — Other Ambulatory Visit: Payer: Self-pay | Admitting: Internal Medicine

## 2020-03-22 DIAGNOSIS — N1832 Chronic kidney disease, stage 3b: Secondary | ICD-10-CM

## 2020-03-23 ENCOUNTER — Other Ambulatory Visit: Payer: Self-pay | Admitting: Adult Health

## 2020-03-23 DIAGNOSIS — Z794 Long term (current) use of insulin: Secondary | ICD-10-CM

## 2020-03-25 NOTE — Telephone Encounter (Signed)
DENIED.  SENT IN ON 09/29/2019 FOR 1 YEAR.

## 2020-03-31 ENCOUNTER — Ambulatory Visit
Admission: RE | Admit: 2020-03-31 | Discharge: 2020-03-31 | Disposition: A | Discharge status: Home / Self Care | Payer: HMO | Source: Ambulatory Visit | Attending: Internal Medicine | Admitting: Internal Medicine

## 2020-03-31 DIAGNOSIS — N1832 Chronic kidney disease, stage 3b: Secondary | ICD-10-CM

## 2020-04-13 ENCOUNTER — Telehealth: Payer: HMO

## 2020-04-15 DIAGNOSIS — H40012 Open angle with borderline findings, low risk, left eye: Secondary | ICD-10-CM | POA: Diagnosis not present

## 2020-04-15 DIAGNOSIS — H4089 Other specified glaucoma: Secondary | ICD-10-CM | POA: Diagnosis not present

## 2020-04-27 DIAGNOSIS — E113592 Type 2 diabetes mellitus with proliferative diabetic retinopathy without macular edema, left eye: Secondary | ICD-10-CM | POA: Diagnosis not present

## 2020-05-10 ENCOUNTER — Other Ambulatory Visit: Payer: Self-pay | Admitting: Adult Health

## 2020-05-10 DIAGNOSIS — Z794 Long term (current) use of insulin: Secondary | ICD-10-CM

## 2020-05-11 ENCOUNTER — Telehealth: Payer: HMO

## 2020-05-12 ENCOUNTER — Other Ambulatory Visit: Payer: Self-pay | Admitting: Adult Health

## 2020-05-12 DIAGNOSIS — G8191 Hemiplegia, unspecified affecting right dominant side: Secondary | ICD-10-CM

## 2020-05-24 ENCOUNTER — Other Ambulatory Visit: Payer: Self-pay | Admitting: Adult Health

## 2020-05-24 DIAGNOSIS — Z794 Long term (current) use of insulin: Secondary | ICD-10-CM

## 2020-05-26 ENCOUNTER — Encounter: Payer: Self-pay | Admitting: Adult Health

## 2020-05-26 ENCOUNTER — Other Ambulatory Visit: Payer: Self-pay

## 2020-05-26 ENCOUNTER — Ambulatory Visit (INDEPENDENT_AMBULATORY_CARE_PROVIDER_SITE_OTHER): Payer: HMO | Admitting: Adult Health

## 2020-05-26 VITALS — BP 150/80 | HR 77 | Temp 98.1°F | Wt 187.6 lb

## 2020-05-26 DIAGNOSIS — Z23 Encounter for immunization: Secondary | ICD-10-CM | POA: Diagnosis not present

## 2020-05-26 DIAGNOSIS — Z794 Long term (current) use of insulin: Secondary | ICD-10-CM

## 2020-05-26 DIAGNOSIS — E1140 Type 2 diabetes mellitus with diabetic neuropathy, unspecified: Secondary | ICD-10-CM | POA: Diagnosis not present

## 2020-05-26 LAB — POCT GLYCOSYLATED HEMOGLOBIN (HGB A1C): Hemoglobin A1C: 6.8 % — AB (ref 4.0–5.6)

## 2020-05-26 NOTE — Progress Notes (Signed)
Subjective:    Patient ID: Russell Austin, male    DOB: [birth year], 69 y.o.   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  He presents to the office today for 56-monthfollow-up regarding diabetes mellitus.  He is currently prescribed glipizide extended release 2.5 mg twice daily, and Lantus 26 units daily.   He has been monitoring his blood sugars at home periodically and reports readings of 90-150.  During his last visit in June 2021 his A1c had improved from 8.1-7.3  He has been using the LWorthvillesystem and is enjoying this. He reports lately that his blood sugars have been below 100. The lowest he had was 62 this morning. He has not had symptoms of hypoglycemia. His wife reports that he continues with high carb diet but has been working hard. 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   Diabetes mellitus type II    Diabetic retinopathy FOLLOWED BY DR RZadie Rhine  ED (erectile dysfunction)    Glaucoma of both eyes    Hypertension    Left hydrocele    Stroke ([facility]     Social History   Socioeconomic History   Marital status: Married    Spouse name: Not on file   Number of children: 1   Years of education: 12   Highest education level: Not on file  Occupational History   Occupation: Disabled  Tobacco Use   Smoking status: Never Smoker   Smokeless tobacco: Never Used  Substance and Sexual Activity   Alcohol use: No   Drug use: No   Sexual activity: Not on file  Other Topics Concern   Not on file  Social History Narrative   Lives at home with his wife and granddaughter.     Left-handed.   3 cups caffeine per day.   Social Determinants of Health   Financial Resource Strain: Low Risk    Difficulty of Paying Living Expenses: Not hard at all  Food Insecurity: No Food Insecurity   Worried About RCharity fundraiserin the Last Year: Never true   RFlippinin the Last Year: Never true  Transportation Needs: No Transportation Needs   Lack of Transportation (Medical): No   Lack of Transportation (Non-Medical): No  Physical Activity:    Days of Exercise per Week: Not on file   Minutes of Exercise per Session: Not on file  Stress:    Feeling of Stress : Not on file  Social Connections:    Frequency of Communication with Friends and Family: Not on file   Frequency of Social Gatherings with Friends and Family: Not on file   Attends Religious Services: Not on file   Active Member of Clubs or Organizations: Not on file   Attends CArchivistMeetings: Not on file   Marital Status: Not on file  Intimate Partner Violence:    Fear of Current or Ex-Partner: Not on file   Emotionally Abused: Not on file   Physically Abused: Not on file   Sexually Abused: Not on file    Past Surgical History:  Procedure Laterality Date   APPENDECTOMY  AGE EARLY 20'S   HYDROCELE EXCISION  03/31/2012   Procedure: HYDROCELECTOMY ADULT;  Surgeon: Franchot Gallo, MD;  Location: [facility];  Service: Urology;  Laterality: Left;  21 MINS     LEFT EYE LASER RETINA REPAIR  SEPT 2012   RIGHT EYE VITRECTOMY/ INSERTION GLAUCOMA SETON/ LASER REPAIR  12-13-2008   RETINAL ARTERY OCCLUSION /NEOVASCULAR GLAUCOMA/ HEMORRHAGE   RIGHT EYE VITRETOMY/ INSERTION GLAUCOMA SETON X2/ LASER  03-24-2009   RECURRENT HEMORRHAGE/ OCCLUSION INTERNAL SETON   SHOULDER ARTHROSCOPY Right 2005   undescended right testicle removed  1994    Family History  Problem Relation Age of Onset   Glaucoma Mother    Diabetes Mother    Stomach cancer Maternal  Grandfather    Stroke Maternal Grandmother     Allergies  Allergen Reactions   Lactose Intolerance (Gi) Diarrhea   Apple Pectin [Pectin] Itching    ITCHY THROAT   Metformin And Related     D/t decreased kidney function    Peach [Prunus Persica] Itching    ITCHY THROAT   Morphine And Related Anxiety    Jittery    Current Outpatient Medications on File Prior to Visit  Medication Sig Dispense Refill   amLODipine (NORVASC) 10 MG tablet Take 1 tablet (10 mg total) by mouth daily. 90 tablet 1   aspirin EC 81 MG tablet Take 81 mg by mouth daily.     atorvastatin (LIPITOR) 10 MG tablet TAKE 1 TABLET BY MOUTH EVERY DAY 90 tablet 3   blood glucose meter kit and supplies Dispense based on Insurance. Test blood sugars once daily. Dx: E10.9 1 each 0   Blood Glucose Monitoring Suppl (FREESTYLE LITE) DEVI Please dispense one Freestyle Lite Glucometer 1 kit 0   buPROPion (WELLBUTRIN SR) 150 MG 12 hr tablet Take 1 tablet (150 mg total) by mouth 2 (two) times daily. 180 tablet 1   Continuous Blood Gluc Receiver (FREESTYLE LIBRE 14 DAY READER) DEVI Use with freestyle libre system 1 each 0   Continuous Blood Gluc Sensor (FREESTYLE LIBRE 14 DAY SENSOR) MISC Change sensor every 2 weeks 2 each 2   gabapentin (NEURONTIN) 300 MG capsule Take 1 capsule (300 mg total) by mouth at bedtime. 90 capsule 1   glipiZIDE (GLUCOTROL XL) 2.5 MG 24 hr tablet TAKE 1 TABLET (2.5 MG TOTAL) BY MOUTH IN THE MORNING AND AT BEDTIME. 180 tablet 0   glucose blood (FREESTYLE LITE) test strip Test once daily dx E10.9 100 each 3   hydrochlorothiazide (HYDRODIURIL) 25 MG tablet TAKE 1 TABLET BY MOUTH DAILY WITH LOSARTAN POTASSIUM 100MG (COMBINATION TAB NOT AVAILABLE) 90 tablet 3   Insulin Pen Needle (B-D ULTRAFINE III SHORT PEN) 31G X 8 MM MISC USE WITH LANUTS PEN 100 each 3   Lancets (FREESTYLE) lancets Use as instructed 100 each 3   LANTUS SOLOSTAR 100 UNIT/ML Solostar Pen INJECT 35 UNITS INTO THE SKIN DAILY.  15 mL 1   losartan (COZAAR) 100 MG tablet TAKE 1 TABLET BY MOUTH DAILY WITH HCTZ (COMBINATION TABLET NOT AVAILABLE) 90 tablet 3   metoprolol tartrate (LOPRESSOR) 50 MG tablet Take 1 tablet (50 mg total) by mouth 2 (two) times daily. 180 tablet 3   mometasone-formoterol (DULERA) 100-5 MCG/ACT AERO Inhale 2 puffs into the lungs 2 (two) times daily.     montelukast (SINGULAIR) 10 MG tablet Take 10 mg by mouth at bedtime.     Spacer/Aero-Holding Chambers (AEROCHAMBER MAX W/FLOW-VU) MISC by  Does not apply route. Use as directed     tiZANidine (ZANAFLEX) 2 MG tablet TAKE 1 TABLET (2 MG TOTAL) BY MOUTH AT BEDTIME. 90 tablet 0   No current facility-administered medications on file prior to visit.    There were no vitals taken for this visit.      Objective:   Physical Exam Vitals and nursing note reviewed.  Constitutional:      Appearance: Normal appearance.  Cardiovascular:     Rate and Rhythm: Normal rate and regular rhythm.     Pulses: Normal pulses.     Heart sounds: Normal heart sounds.  Pulmonary:     Effort: Pulmonary effort is normal.  Skin:    General: Skin is warm and dry.  Neurological:     General: No focal deficit present.     Mental Status: He is alert and oriented to person, place, and time.  Psychiatric:        Mood and Affect: Mood normal.        Behavior: Behavior normal.        Thought Content: Thought content normal.        Judgment: Judgment normal.       Assessment & Plan:  1. Type 2 diabetes mellitus with diabetic neuropathy, with long-term current use of insulin (HCC)  - POC HgB A1c- 6.8 - has improved. No change in medications at this time. If he continues to have blood sugars in the 60's then will have him drop insulin to 24 units. Follow up in 3 months   2. Flu vaccine need  - Flu Vaccine QUAD High Dose(Fluad)   [name], NP

## 2020-06-07 ENCOUNTER — Ambulatory Visit: Payer: HMO | Attending: Internal Medicine

## 2020-06-07 DIAGNOSIS — Z23 Encounter for immunization: Secondary | ICD-10-CM

## 2020-06-07 NOTE — Progress Notes (Signed)
   Covid-19 Vaccination Clinic  Name:  KIRKE BREACH    MRN: [id number] DOB: 04/10/1951  06/07/2020  Mr. Ide was observed post Covid-19 immunization for 15 minutes without incident. He was provided with Vaccine Information Sheet and instruction to access the V-Safe system.   Mr. Kunin was instructed to call 911 with any severe reactions post vaccine: Marland Kitchen Difficulty breathing  . Swelling of face and throat  . A fast heartbeat  . A bad rash all over body  . Dizziness and weakness

## 2020-06-13 DIAGNOSIS — N1832 Chronic kidney disease, stage 3b: Secondary | ICD-10-CM | POA: Diagnosis not present

## 2020-06-16 ENCOUNTER — Other Ambulatory Visit: Payer: Self-pay | Admitting: Adult Health

## 2020-06-16 DIAGNOSIS — N1832 Chronic kidney disease, stage 3b: Secondary | ICD-10-CM | POA: Diagnosis not present

## 2020-06-22 DIAGNOSIS — I639 Cerebral infarction, unspecified: Secondary | ICD-10-CM | POA: Diagnosis not present

## 2020-06-22 DIAGNOSIS — E782 Mixed hyperlipidemia: Secondary | ICD-10-CM | POA: Diagnosis not present

## 2020-06-22 DIAGNOSIS — I129 Hypertensive chronic kidney disease with stage 1 through stage 4 chronic kidney disease, or unspecified chronic kidney disease: Secondary | ICD-10-CM | POA: Diagnosis not present

## 2020-06-22 DIAGNOSIS — N1832 Chronic kidney disease, stage 3b: Secondary | ICD-10-CM | POA: Diagnosis not present

## 2020-07-06 DIAGNOSIS — H3582 Retinal ischemia: Secondary | ICD-10-CM | POA: Diagnosis not present

## 2020-07-06 DIAGNOSIS — H5711 Ocular pain, right eye: Secondary | ICD-10-CM | POA: Diagnosis not present

## 2020-07-06 DIAGNOSIS — E113592 Type 2 diabetes mellitus with proliferative diabetic retinopathy without macular edema, left eye: Secondary | ICD-10-CM | POA: Diagnosis not present

## 2020-07-08 ENCOUNTER — Telehealth: Payer: Self-pay

## 2020-07-08 NOTE — Telephone Encounter (Signed)
Patient called in stating that he is needing surgery to get his eye removed states he has an appt 07/14/20 at 8:30am but the office is stating the patients needs a refill from PCP     Please call and advise    [place] [address] Dr Baird Cancer  [phone]

## 2020-07-12 ENCOUNTER — Other Ambulatory Visit: Payer: Self-pay | Admitting: Adult Health

## 2020-07-12 DIAGNOSIS — H5440 Blindness, one eye, unspecified eye: Secondary | ICD-10-CM

## 2020-07-12 DIAGNOSIS — E1140 Type 2 diabetes mellitus with diabetic neuropathy, unspecified: Secondary | ICD-10-CM

## 2020-07-12 DIAGNOSIS — Z794 Long term (current) use of insulin: Secondary | ICD-10-CM

## 2020-07-12 NOTE — Telephone Encounter (Signed)
A refill of what?

## 2020-07-12 NOTE — Telephone Encounter (Signed)
Patient needs a referral  opthalmology   [place]  [address]  07/14/20

## 2020-07-14 ENCOUNTER — Other Ambulatory Visit: Payer: Self-pay | Admitting: Adult Health

## 2020-07-14 DIAGNOSIS — E785 Hyperlipidemia, unspecified: Secondary | ICD-10-CM | POA: Insufficient documentation

## 2020-07-14 DIAGNOSIS — H57813 Brow ptosis, bilateral: Secondary | ICD-10-CM | POA: Diagnosis not present

## 2020-07-14 DIAGNOSIS — H02831 Dermatochalasis of right upper eyelid: Secondary | ICD-10-CM | POA: Diagnosis not present

## 2020-07-14 DIAGNOSIS — H5711 Ocular pain, right eye: Secondary | ICD-10-CM | POA: Diagnosis not present

## 2020-07-14 DIAGNOSIS — H5440 Blindness, one eye, unspecified eye: Secondary | ICD-10-CM

## 2020-07-14 DIAGNOSIS — H0551 Retained (old) foreign body following penetrating wound of right orbit: Secondary | ICD-10-CM | POA: Diagnosis not present

## 2020-07-14 DIAGNOSIS — H02834 Dermatochalasis of left upper eyelid: Secondary | ICD-10-CM | POA: Diagnosis not present

## 2020-07-14 DIAGNOSIS — M1990 Unspecified osteoarthritis, unspecified site: Secondary | ICD-10-CM | POA: Insufficient documentation

## 2020-07-14 DIAGNOSIS — E11618 Type 2 diabetes mellitus with other diabetic arthropathy: Secondary | ICD-10-CM | POA: Insufficient documentation

## 2020-07-14 DIAGNOSIS — H44521 Atrophy of globe, right eye: Secondary | ICD-10-CM | POA: Diagnosis not present

## 2020-07-14 DIAGNOSIS — M722 Plantar fascial fibromatosis: Secondary | ICD-10-CM | POA: Insufficient documentation

## 2020-07-14 DIAGNOSIS — E119 Type 2 diabetes mellitus without complications: Secondary | ICD-10-CM | POA: Insufficient documentation

## 2020-07-14 NOTE — Telephone Encounter (Signed)
I have resent the referral in since I was told he was seeing Dr. Baird Cancer for whom I sent the referral to first.

## 2020-07-14 NOTE — Telephone Encounter (Signed)
Patient informed of the message below.

## 2020-07-14 NOTE — Telephone Encounter (Signed)
Patient called back in this morning, stating that he is on his way to his appt and needs the referral sent in ASAP  He is going to see   Dr. Lorina Rabon [facility] [phone]

## 2020-07-19 DIAGNOSIS — H5711 Ocular pain, right eye: Secondary | ICD-10-CM | POA: Diagnosis not present

## 2020-07-19 DIAGNOSIS — H44521 Atrophy of globe, right eye: Secondary | ICD-10-CM | POA: Diagnosis not present

## 2020-07-19 DIAGNOSIS — H0551 Retained (old) foreign body following penetrating wound of right orbit: Secondary | ICD-10-CM | POA: Diagnosis not present

## 2020-07-20 ENCOUNTER — Ambulatory Visit (INDEPENDENT_AMBULATORY_CARE_PROVIDER_SITE_OTHER): Payer: HMO | Admitting: Adult Health

## 2020-07-20 ENCOUNTER — Other Ambulatory Visit: Payer: Self-pay

## 2020-07-20 ENCOUNTER — Encounter: Payer: Self-pay | Admitting: Adult Health

## 2020-07-20 VITALS — BP 140/80 | HR 71 | Temp 98.0°F | Wt 189.2 lb

## 2020-07-20 DIAGNOSIS — M545 Low back pain, unspecified: Secondary | ICD-10-CM | POA: Diagnosis not present

## 2020-07-20 MED ORDER — CYCLOBENZAPRINE HCL 10 MG PO TABS: 10.0000 mg | ORAL_TABLET | Freq: Three times a day (TID) | ORAL | 0 refills | Status: DC | PRN

## 2020-07-20 NOTE — Progress Notes (Signed)
Subjective:    Patient ID: Russell Austin, male    DOB: 13-Jun-1951, 68 y.o.   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  He presents to the office today for acute onset of low back pain x 4 days. Reports that he thinks he pulled a muscle when he was picking up a heater. Has an " aching" pain when he changes positions and when he bends and twists.   He denies chest pain, SOB, abdominal pain, dysuria, hematuria, of difficulty urinating. Has not had any pain between his shoulder Austin  He has not been using anything OTC   Review of Systems See HPI   Past Medical History:  Diagnosis Date  . 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 Diabetes mellitus type II   . Diabetic retinopathy FOLLOWED BY DR Zadie Rhine  . ED (erectile dysfunction)   . Glaucoma of both eyes   . Hypertension   . Left hydrocele   . Stroke [facility])     Social History   Socioeconomic History  . Marital status: Married    Spouse name: Not on file  . Number of children: 1  . Years of education: 24  . Highest education level: Not on file  Occupational History  . Occupation: Disabled  Tobacco Use  . Smoking status: Never Smoker  . Smokeless tobacco: Never Used  Substance and Sexual Activity  . Alcohol use: No  . Drug use: No  . Sexual activity: Not on file  Other Topics Concern  . Not on file  Social History Narrative   Lives at home with his wife and granddaughter.   Left-handed.   3 cups caffeine per day.   Social Determinants of Health   Financial Resource Strain: Low Risk   . Difficulty of Paying Living Expenses: Not hard at all  Food Insecurity: No Food Insecurity  . Worried About Charity fundraiser in the Last Year:  Never true  . Ran Out of Food in the Last Year: Never true  Transportation Needs: No Transportation Needs  . Lack of Transportation (Medical): No  . Lack of Transportation (Non-Medical): No  Physical Activity:   . Days of Exercise per Week: Not on file  . Minutes of Exercise per Session: Not on file  Stress:   . Feeling of Stress : Not on file  Social Connections:   . Frequency of Communication with Friends and Family: Not on file  . Frequency of Social Gatherings with Friends and Family: Not on file  . Attends Religious Services: Not on file  . Active Member of Clubs or Organizations: Not on file  . Attends Archivist Meetings: Not on file  . Marital Status: Not on file  Intimate Partner Violence:   . Fear of Current or Ex-Partner: Not on file  . Emotionally Abused: Not on file  . Physically Abused: Not on file  . Sexually Abused: Not on file    Past Surgical History:  Procedure Laterality Date  . APPENDECTOMY  AGE EARLY 20'S  . HYDROCELE EXCISION  03/31/2012   Procedure: HYDROCELECTOMY ADULT;  Surgeon: Franchot Gallo, MD;  Location: [facility];  Service: Urology;  Laterality: Left;  45 MINS    . LEFT EYE  LASER RETINA REPAIR  SEPT 2012  . RIGHT EYE VITRECTOMY/ INSERTION GLAUCOMA SETON/ LASER REPAIR  12-13-2008   RETINAL ARTERY OCCLUSION /NEOVASCULAR GLAUCOMA/ HEMORRHAGE  . RIGHT EYE VITRETOMY/ INSERTION GLAUCOMA SETON X2/ LASER  03-24-2009   RECURRENT HEMORRHAGE/ OCCLUSION INTERNAL SETON  . SHOULDER ARTHROSCOPY Right 2005  . undescended right testicle removed  1994    Family History  Problem Relation Age of Onset  . Glaucoma Mother   . Diabetes Mother   . Stomach cancer Maternal Grandfather   . Stroke Maternal Grandmother     Allergies  Allergen Reactions  . Lactose Intolerance (Gi) Diarrhea  . Apple Pectin [Pectin] Itching    ITCHY THROAT  . Metformin And Related     D/t decreased kidney function   . Peach [Prunus Persica] Itching      ITCHY THROAT  . Morphine And Related Anxiety    Jittery    Current Outpatient Medications on File Prior to Visit  Medication Sig Dispense Refill  . amLODipine (NORVASC) 10 MG tablet Take 1 tablet (10 mg total) by mouth daily. 90 tablet 1  . aspirin EC 81 MG tablet Take 81 mg by mouth daily.    Marland Kitchen atorvastatin (LIPITOR) 10 MG tablet TAKE 1 TABLET BY MOUTH EVERY DAY 90 tablet 3  . blood glucose meter kit and supplies Dispense based on Insurance. Test blood sugars once daily. Dx: E10.9 1 each 0  . Blood Glucose Monitoring Suppl (FREESTYLE LITE) DEVI Please dispense one Freestyle Lite Glucometer 1 kit 0  . buPROPion (WELLBUTRIN SR) 150 MG 12 hr tablet TAKE 1 TABLET BY MOUTH TWICE A DAY 180 tablet 0  . Continuous Blood Gluc Receiver (FREESTYLE LIBRE 14 DAY READER) DEVI Use with freestyle libre system 1 each 0  . Continuous Blood Gluc Sensor (FREESTYLE LIBRE 14 DAY SENSOR) MISC CHANGE SENSOR EVERY 2 WEEKS 2 each 2  . gabapentin (NEURONTIN) 300 MG capsule Take 1 capsule (300 mg total) by mouth at bedtime. 90 capsule 1  . glipiZIDE (GLUCOTROL XL) 2.5 MG 24 hr tablet TAKE 1 TABLET (2.5 MG TOTAL) BY MOUTH IN THE MORNING AND AT BEDTIME. 180 tablet 0  . glucose blood (FREESTYLE LITE) test strip Test once daily dx E10.9 100 each 3  . hydrochlorothiazide (HYDRODIURIL) 25 MG tablet TAKE 1 TABLET BY MOUTH DAILY WITH LOSARTAN POTASSIUM 100MG (COMBINATION TAB NOT AVAILABLE) 90 tablet 3  . Insulin Pen Needle (B-D ULTRAFINE III SHORT PEN) 31G X 8 MM MISC USE WITH LANUTS PEN 100 each 3  . Lancets (FREESTYLE) lancets Use as instructed 100 each 3  . LANTUS SOLOSTAR 100 UNIT/ML Solostar Pen INJECT 35 UNITS INTO THE SKIN DAILY. 15 mL 1  . losartan (COZAAR) 100 MG tablet TAKE 1 TABLET BY MOUTH DAILY WITH HCTZ (COMBINATION TABLET NOT AVAILABLE) 90 tablet 3  . metoprolol tartrate (LOPRESSOR) 50 MG tablet Take 1 tablet (50 mg total) by mouth 2 (two) times daily. 180 tablet 3  . mometasone-formoterol (DULERA) 100-5  MCG/ACT AERO Inhale 2 puffs into the lungs 2 (two) times daily.    . montelukast (SINGULAIR) 10 MG tablet Take 10 mg by mouth at bedtime.     Marland Kitchen Spacer/Aero-Holding Chambers (AEROCHAMBER MAX W/FLOW-VU) MISC by Does not apply route. Use as directed    . tiZANidine (ZANAFLEX) 2 MG tablet TAKE 1 TABLET (2 MG TOTAL) BY MOUTH AT BEDTIME. 90 tablet 0   No current facility-administered medications on file prior to visit.    BP 140/80 (BP Location: Left Arm,  Patient Position: Sitting, Cuff Size: Large)   Pulse 71   Temp 98 F (36.7 C) (Oral)   Wt 189 lb 3.2 oz (85.8 kg)   SpO2 97%   BMI 29.20 kg/m       Objective:   Physical Exam Vitals and nursing note reviewed.  Cardiovascular:     Rate and Rhythm: Normal rate and regular rhythm.  Musculoskeletal:        General: Tenderness present.     Lumbar back: Spasms and tenderness present. No bony tenderness. Decreased range of motion.       Back:  Skin:    General: Skin is warm and dry.     Capillary Refill: Capillary refill takes less than 2 seconds.  Neurological:     General: No focal deficit present.     Mental Status: He is alert and oriented to person, place, and time.  Psychiatric:        Mood and Affect: Mood normal.        Behavior: Behavior normal.        Thought Content: Thought content normal.        Judgment: Judgment normal.       Assessment & Plan:  1. Acute midline low back pain without sciatica - appears as muscle strain. Advised motrin PRN heating pad and will send in Flexeril he can use PRN  - cyclobenzaprine (FLEXERIL) 10 MG tablet; Take 1 tablet (10 mg total) by mouth 3 (three) times daily as needed for muscle spasms.  Dispense: 30 tablet; Refill: 0

## 2020-08-03 ENCOUNTER — Other Ambulatory Visit: Payer: Self-pay | Admitting: Adult Health

## 2020-08-03 DIAGNOSIS — M545 Low back pain, unspecified: Secondary | ICD-10-CM

## 2020-08-03 MED ORDER — CYCLOBENZAPRINE HCL 10 MG PO TABS: 10.0000 mg | ORAL_TABLET | Freq: Three times a day (TID) | ORAL | 0 refills | Status: DC | PRN

## 2020-08-03 NOTE — Addendum Note (Signed)
Addended by: Westley Hummer B on: 08/03/2020 10:17 AM   Modules accepted: Orders

## 2020-08-03 NOTE — Telephone Encounter (Signed)
cyclobenzaprine (FLEXERIL) 10 MG tablet  [id number] - [place], [place] - [address] Phone:  [phone]  Fax:  [phone]

## 2020-08-09 ENCOUNTER — Encounter: Payer: Self-pay | Admitting: Family Medicine

## 2020-08-09 ENCOUNTER — Other Ambulatory Visit: Payer: Self-pay

## 2020-08-09 ENCOUNTER — Ambulatory Visit (INDEPENDENT_AMBULATORY_CARE_PROVIDER_SITE_OTHER): Payer: HMO | Admitting: Family Medicine

## 2020-08-09 VITALS — BP 124/80 | HR 91 | Resp 16 | Ht 67.5 in | Wt 186.0 lb

## 2020-08-09 DIAGNOSIS — M545 Low back pain, unspecified: Secondary | ICD-10-CM

## 2020-08-09 MED ORDER — DICLOFENAC SODIUM 1 % EX GEL: 4.0000 g | Freq: Four times a day (QID) | CUTANEOUS | 0 refills | Status: DC

## 2020-08-09 NOTE — Progress Notes (Addendum)
ACUTE VISIT  Chief Complaint  Patient presents with  . Back Pain    x 2 weeks   HPI: [name] is a 69 y.o. male with history of DM 2, hypertension, CVA, and CKD 2  here today with his male friend complaining of right-sided back pain.  He denies any recent injury or unusual level of activity but he states that about 3 weeks ago he picked up a heavy heater, he does not remember having pain at this time.  Pain is constant,not radiated to lower extremities but sometimes it spreads to the left side. Pain ease achy like, yesterday he had an episode of sharp pain radiated to abdomen that lasted for a few seconds. Pain ease now 8/10 in intensity, with no associated LE numbness, tingling, urinary incontinence or retention, stool incontinence, or saddle anesthesia.  Exacerbated by movement and by lying down in bed. He is sleeping on a recliner. He has no identified alleviating factors. No rash or edema on area, fever, chills, or abnormal wt loss.  He is reporting problem as new but reviewing records it seems like he had similar problem last year. He is also reporting having an epidural injection. He is not longer following with neurosurgeon.   OTC medications: None. He has been taking Zanaflex, which has helped with muscle spasms but has not helped with pain. Lumbar MRI done on 10/28/2018: In this patient with left radicular pain, the most suspicious abnormality is at L5-S1 where there is a broad-based, left posterolateral predominant disc herniation with potential to compress in particular the left S1 nerve in the lateral recess.  L4-5 shows bulging of the disc with focal prominence in the left foraminal region which would have some potential to affect the left L4 nerve as it exits, though definite compression is not established. 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 L3-4 shows annular bulging with annular fissures. Mild narrowing of the  lateral recesses but no distinct neural compression.  Lab Results  Component Value Date   CREATININE 2.07 (H) 11/11/2019   BUN 31 (H) 11/11/2019   NA 139 11/11/2019   K 4.7 11/11/2019   CL 107 11/11/2019   CO2 28 11/11/2019   Review of Systems  Constitutional: Positive for fatigue. Negative for activity change and appetite change.  Respiratory: Negative for cough, shortness of breath and wheezing.   Cardiovascular: Negative for leg swelling.  Gastrointestinal: Negative for abdominal pain, nausea and vomiting.  Genitourinary: Negative for decreased urine volume, dysuria and hematuria.  Musculoskeletal: Positive for gait problem.  Psychiatric/Behavioral: Positive for sleep disturbance. Negative for confusion.  Rest see pertinent positives and negatives per HPI.  Current Outpatient Medications on File Prior to Visit  Medication Sig Dispense Refill  . amLODipine (NORVASC) 10 MG tablet Take 1 tablet (10 mg total) by mouth daily. 90 tablet 1  . aspirin EC 81 MG tablet Take 81 mg by mouth daily.    Marland Kitchen atorvastatin (LIPITOR) 10 MG tablet TAKE 1 TABLET BY MOUTH EVERY DAY 90 tablet 3  . blood glucose meter kit and supplies Dispense based on Insurance. Test blood sugars once daily. Dx: E10.9 1 each 0  . Blood Glucose Monitoring Suppl (FREESTYLE LITE) DEVI Please dispense one Freestyle Lite Glucometer 1 kit 0  . buPROPion (WELLBUTRIN SR) 150 MG 12 hr tablet TAKE 1 TABLET BY MOUTH TWICE A DAY 180 tablet 0  . Continuous Blood Gluc Receiver (FREESTYLE LIBRE 14 DAY READER) DEVI Use with  freestyle libre system 1 each 0  . Continuous Blood Gluc Sensor (FREESTYLE LIBRE 14 DAY SENSOR) MISC CHANGE SENSOR EVERY 2 WEEKS 2 each 2  . cyclobenzaprine (FLEXERIL) 10 MG tablet Take 1 tablet (10 mg total) by mouth 3 (three) times daily as needed for muscle spasms. 30 tablet 0  . gabapentin (NEURONTIN) 300 MG capsule Take 1 capsule (300 mg total) by mouth at bedtime. 90 capsule 1  . glucose blood (FREESTYLE LITE)  test strip Test once daily dx E10.9 100 each 3  . hydrochlorothiazide (HYDRODIURIL) 25 MG tablet TAKE 1 TABLET BY MOUTH DAILY WITH LOSARTAN POTASSIUM 100MG (COMBINATION TAB NOT AVAILABLE) 90 tablet 3  . Insulin Pen Needle (B-D ULTRAFINE III SHORT PEN) 31G X 8 MM MISC USE WITH LANUTS PEN 100 each 3  . Lancets (FREESTYLE) lancets Use as instructed 100 each 3  . LANTUS SOLOSTAR 100 UNIT/ML Solostar Pen INJECT 35 UNITS INTO THE SKIN DAILY. 15 mL 1  . losartan (COZAAR) 100 MG tablet TAKE 1 TABLET BY MOUTH DAILY WITH HCTZ (COMBINATION TABLET NOT AVAILABLE) 90 tablet 3  . metoprolol tartrate (LOPRESSOR) 50 MG tablet Take 1 tablet (50 mg total) by mouth 2 (two) times daily. 180 tablet 3  . mometasone-formoterol (DULERA) 100-5 MCG/ACT AERO Inhale 2 puffs into the lungs 2 (two) times daily.    . montelukast (SINGULAIR) 10 MG tablet Take 10 mg by mouth at bedtime.     Marland Kitchen Spacer/Aero-Holding Chambers (AEROCHAMBER MAX W/FLOW-VU) MISC by Does not apply route. Use as directed    . tiZANidine (ZANAFLEX) 2 MG tablet TAKE 1 TABLET (2 MG TOTAL) BY MOUTH AT BEDTIME. 90 tablet 0  . glipiZIDE (GLUCOTROL XL) 2.5 MG 24 hr tablet TAKE 1 TABLET (2.5 MG TOTAL) BY MOUTH IN THE MORNING AND AT BEDTIME. 180 tablet 0   No current facility-administered medications on file prior to visit.    Past Medical History:  Diagnosis Date  . 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 Diabetes mellitus type II   . Diabetic retinopathy FOLLOWED BY DR Russell Austin  . ED (erectile dysfunction)   . Glaucoma of both eyes   . Hypertension   . Left hydrocele   . Stroke [facility])    Allergies  Allergen Reactions  . Lactose Intolerance (Gi) Diarrhea  . Apple Pectin [Pectin] Itching    ITCHY THROAT  . Metformin And Related     D/t decreased kidney function   . Peach [Prunus Persica] Itching    ITCHY THROAT  . Morphine And Related Anxiety    Jittery    Social History   Socioeconomic History  . Marital status: Married     Spouse name: Not on file  . Number of children: 1  . Years of education: 36  . Highest education level: Not on file  Occupational History  . Occupation: Disabled  Tobacco Use  . Smoking status: Never Smoker  . Smokeless tobacco: Never Used  Substance and Sexual Activity  . Alcohol use: No  . Drug use: No  . Sexual activity: Not on file  Other Topics Concern  . Not on file  Social History Narrative   Lives at home with his wife and granddaughter.   Left-handed.   3 cups caffeine per day.   Social Determinants of Health   Financial Resource Strain: Low Risk   . Difficulty of Paying Living Expenses: Not hard at all  Food Insecurity: No Food Insecurity  . Worried About Charity fundraiser  in the Last Year: Never true  . Ran Out of Food in the Last Year: Never true  Transportation Needs: No Transportation Needs  . Lack of Transportation (Medical): No  . Lack of Transportation (Non-Medical): No  Physical Activity:   . Days of Exercise per Week: Not on file  . Minutes of Exercise per Session: Not on file  Stress:   . Feeling of Stress : Not on file  Social Connections:   . Frequency of Communication with Friends and Family: Not on file  . Frequency of Social Gatherings with Friends and Family: Not on file  . Attends Religious Services: Not on file  . Active Member of Clubs or Organizations: Not on file  . Attends Archivist Meetings: Not on file  . Marital Status: Not on file   Vitals:   08/09/20 0726  BP: 124/80  Pulse: 91  Resp: 16  SpO2: 99%   Body mass index is 28.7 kg/m.  Physical Exam Vitals and nursing note reviewed.  Constitutional:      General: He is not in acute distress.    Appearance: He is well-developed.  HENT:     Head: Normocephalic and atraumatic.  Eyes:     Conjunctiva/sclera: Conjunctivae normal.  Cardiovascular:     Rate and Rhythm: Normal rate and regular rhythm.     Pulses:          Dorsalis pedis pulses are 2+ on the right  side and 2+ on the left side.  Pulmonary:     Effort: Pulmonary effort is normal. No respiratory distress.     Breath sounds: Normal breath sounds.  Abdominal:     Palpations: Abdomen is soft. There is no mass.     Tenderness: There is no abdominal tenderness.  Musculoskeletal:     Thoracic back: Spasms present. No bony tenderness.     Lumbar back: Spasms and tenderness present. No bony tenderness. Negative right straight leg raise test and negative left straight leg raise test.       Back:     Comments: Mild scoliosis. There is tenderness upon palpation of paraspinal muscles from lower thoracic and lumbar paraspinal muscles.. Pain elicited with movement on exam table during examination. No local edema or erythema appreciated, no suspicious lesions.   Skin:    General: Skin is warm.     Findings: No erythema.  Neurological:     Mental Status: He is alert and oriented to person, place, and time.     Deep Tendon Reflexes:     Reflex Scores:      Patellar reflexes are 2+ on the right side and 1+ on the left side.    Comments: Right-sided weakness, s/p CVA. Antalgic gait, not assisted.   ASSESSMENT AND PLAN:  [name] was seen today for back pain.  Diagnoses and all orders for this visit:  Right-sided low back pain without sciatica, unspecified chronicity  Other orders -     diclofenac Sodium (VOLTAREN) 1 % GEL; Apply 4 g topically 4 (four) times daily.  This seems to be a chronic problem with acute exacerbation. Because medical history, we do not have a lot of options for pain management. Recommend Tylenol 500 mg 3-4 times per day and Tylenol PM at bedtime x1. Topical Voltaren is also a good option. OTC IcyHot patch on affected area may also help. Continue Zanaflex 2-4 mg tid prn,we discussed side effects. He prefers to hold on PT for now. I do not think imaging is  needed at this time. Instructed about warning signs. Follow-up with PCP in 2 weeks, before if  needed.  Spent 32 minutes with pt.  During this time history was obtained and documented, examination was performed, prior labs/imaging reviewed with pt, and assessment/plan discussed.  Return in about 2 weeks (around 08/23/2020) for Back pain with pcp.   Gualberto Wahlen G. [place], MD  [facility]. [place] office.   A few things to remember from today's visit:  Right-sided low back pain without sciatica, unspecified chronicity  If you need refills please call your pharmacy. Do not use My Chart to request refills or for acute issues that need immediate attention.   Because kidney disease we do not have many options in regard to pain management. Icy hot patch or similar products may help. Continue with Zanaflex, fall precautions. Topical Voltaren may also help.  Tylenol 500 mg 1 tab 3-4 times and a Tylenol pm at bedtime.  Please be sure medication list is accurate. If a new problem present, please set up appointment sooner than planned today.

## 2020-08-09 NOTE — Patient Instructions (Addendum)
A few things to remember from today's visit:  Right-sided low back pain without sciatica, unspecified chronicity  If you need refills please call your pharmacy. Do not use My Chart to request refills or for acute issues that need immediate attention.   Because kidney disease we do not have many options in regard to pain management. Icy hot patch or similar products may help. Continue with Zanaflex, fall precautions. Topical Voltaren may also help.  Tylenol 500 mg 1 tab 3-4 times and a Tylenol pm at bedtime.  Please be sure medication list is accurate. If a new problem present, please set up appointment sooner than planned today.

## 2020-08-19 ENCOUNTER — Other Ambulatory Visit: Payer: Self-pay | Admitting: *Deleted

## 2020-08-19 NOTE — Patient Outreach (Signed)
  [facility]) Care Management Chronic Special Needs Program    08/19/2020  Name: Russell Austin, DOB: August 28, 1951  MRN: [id number]   [name] is enrolled in a chronic special needs plan for Diabetes.  [place] care management will continue to provide services for this member through 09/02/20.  The [facility] [employer] care management team will assume care 09/03/20.  Jacqlyn Larsen [facility], BSN [place], [place]

## 2020-08-25 ENCOUNTER — Other Ambulatory Visit: Payer: Self-pay

## 2020-08-25 ENCOUNTER — Encounter: Payer: Self-pay | Admitting: Adult Health

## 2020-08-25 ENCOUNTER — Ambulatory Visit (INDEPENDENT_AMBULATORY_CARE_PROVIDER_SITE_OTHER): Payer: HMO | Admitting: Adult Health

## 2020-08-25 VITALS — BP 142/80 | Temp 98.6°F | Wt 189.0 lb

## 2020-08-25 DIAGNOSIS — R2681 Unsteadiness on feet: Secondary | ICD-10-CM

## 2020-08-25 DIAGNOSIS — Z794 Long term (current) use of insulin: Secondary | ICD-10-CM | POA: Diagnosis not present

## 2020-08-25 DIAGNOSIS — M545 Low back pain, unspecified: Secondary | ICD-10-CM | POA: Diagnosis not present

## 2020-08-25 DIAGNOSIS — E1140 Type 2 diabetes mellitus with diabetic neuropathy, unspecified: Secondary | ICD-10-CM

## 2020-08-25 LAB — POCT GLYCOSYLATED HEMOGLOBIN (HGB A1C): Hemoglobin A1C: 7.4 % — AB (ref 4.0–5.6)

## 2020-08-25 MED ORDER — GLIPIZIDE ER 2.5 MG PO TB24: 2.5000 mg | ORAL_TABLET | Freq: Two times a day (BID) | ORAL | 0 refills | Status: DC

## 2020-08-25 NOTE — Progress Notes (Signed)
Subjective:    Patient ID: Russell Austin, male    DOB: [birth year], 69 y.o.   MRN: [id number]  HPI 69 year old male who  has a past medical history of Blindness, legal (RIGHT EYE SECONDARY TO ACUTE GLAUCOMA), Diabetes mellitus type II, Diabetic retinopathy (FOLLOWED BY DR Zadie Rhine), ED (erectile dysfunction), Glaucoma of both eyes, Hypertension, Left hydrocele, and Stroke ([place]).  He presents to the office today for 35-monthfollow-up regarding diabetes mellitus.  He is currently prescribed glipizide ER 2.5 mg twice daily and Lantus 22 units daily  Lab Results  Component Value Date   HGBA1C 6.8 (A) 05/26/2020   He continues to use the [employer] to check his blood sugars, he did not bring his reader today.  He reports readings have been between 120-160's. He has had an outlier in the 200's this morning, but did eat a lot of cookies last night.   Additionally he reports that he continues to have acute midline low back pain as well as right-sided low back pain that he has been seen twice for over the last 2 months.  His pain is exacerbated by movement and laying down in bed as well as sitting for long periods of time.  He has used Flexeril in the past which seems to give him some improvement in his pain quality.  Last MRI in 2.2020 showed  Lumbar MRI done on 10/28/2018: In this patient with left radicular pain, the most suspicious abnormality is at L5-S1 where there is a broad-based, left posterolateral predominant disc herniation with potential to compress in particular the left S1 nerve in the lateral recess.  L4-5 shows bulging of the disc with focal prominence in the left foraminal region which would have some potential to affect the left L4 nerve as it exits, though definite compression is not established. 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 L3-4 shows annular bulging with annular fissures. Mild narrowing of the lateral recesses but no distinct  neural compression.  He is starting to feel off balance, but fortunately has not had any falls.  Feels more off balance when changing positions and starting to walk.  Review of Systems See HPI   Past Medical History:  Diagnosis Date  . 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 Diabetes mellitus type II   . Diabetic retinopathy FOLLOWED BY DR RZadie Rhine . ED (erectile dysfunction)   . Glaucoma of both eyes   . Hypertension   . Left hydrocele   . Stroke ([facility]     Social History   Socioeconomic History  . Marital status: Married    Spouse name: Not on file  . Number of children: 1  . Years of education: 141 . Highest education level: Not on file  Occupational History  . Occupation: Disabled  Tobacco Use  . Smoking status: Never Smoker  . Smokeless tobacco: Never Used  Substance and Sexual Activity  . Alcohol use: No  . Drug use: No  . Sexual activity: Not on file  Other Topics Concern  . Not on file  Social History Narrative   Lives at home with his wife and granddaughter.   Left-handed.   3 cups caffeine per day.   Social Determinants of Health   Financial Resource Strain: Low Risk   . Difficulty of Paying Living Expenses: Not hard at all  Food Insecurity: No Food Insecurity  . Worried About RCharity fundraiserin the Last  Year: Never true  . Ran Out of Food in the Last Year: Never true  Transportation Needs: No Transportation Needs  . Lack of Transportation (Medical): No  . Lack of Transportation (Non-Medical): No  Physical Activity: Not on file  Stress: Not on file  Social Connections: Not on file  Intimate Partner Violence: Not on file    Past Surgical History:  Procedure Laterality Date  . APPENDECTOMY  AGE EARLY 20'S  . HYDROCELE EXCISION  03/31/2012   Procedure: HYDROCELECTOMY ADULT;  Surgeon: Franchot Gallo, MD;  Location: [facility];  Service: Urology;  Laterality: Left;  45 MINS    . LEFT EYE LASER RETINA REPAIR   SEPT 2012  . RIGHT EYE VITRECTOMY/ INSERTION GLAUCOMA SETON/ LASER REPAIR  12-13-2008   RETINAL ARTERY OCCLUSION /NEOVASCULAR GLAUCOMA/ HEMORRHAGE  . RIGHT EYE VITRETOMY/ INSERTION GLAUCOMA SETON X2/ LASER  03-24-2009   RECURRENT HEMORRHAGE/ OCCLUSION INTERNAL SETON  . SHOULDER ARTHROSCOPY Right 2005  . undescended right testicle removed  1994    Family History  Problem Relation Age of Onset  . Glaucoma Mother   . Diabetes Mother   . Stomach cancer Maternal Grandfather   . Stroke Maternal Grandmother     Allergies  Allergen Reactions  . Lactose Intolerance (Gi) Diarrhea  . Apple Pectin [Pectin] Itching    ITCHY THROAT  . Metformin And Related     D/t decreased kidney function   . Peach [Prunus Persica] Itching    ITCHY THROAT  . Morphine And Related Anxiety    Jittery    Current Outpatient Medications on File Prior to Visit  Medication Sig Dispense Refill  . amLODipine (NORVASC) 10 MG tablet Take 1 tablet (10 mg total) by mouth daily. 90 tablet 1  . aspirin EC 81 MG tablet Take 81 mg by mouth daily.    Marland Kitchen atorvastatin (LIPITOR) 10 MG tablet TAKE 1 TABLET BY MOUTH EVERY DAY 90 tablet 3  . blood glucose meter kit and supplies Dispense based on Insurance. Test blood sugars once daily. Dx: E10.9 1 each 0  . Blood Glucose Monitoring Suppl (FREESTYLE LITE) DEVI Please dispense one Freestyle Lite Glucometer 1 kit 0  . buPROPion (WELLBUTRIN SR) 150 MG 12 hr tablet TAKE 1 TABLET BY MOUTH TWICE A DAY 180 tablet 0  . Continuous Blood Gluc Receiver (FREESTYLE LIBRE 14 DAY READER) DEVI Use with freestyle libre system 1 each 0  . Continuous Blood Gluc Sensor (FREESTYLE LIBRE 14 DAY SENSOR) MISC CHANGE SENSOR EVERY 2 WEEKS 2 each 2  . cyclobenzaprine (FLEXERIL) 10 MG tablet Take 1 tablet (10 mg total) by mouth 3 (three) times daily as needed for muscle spasms. 30 tablet 0  . diclofenac Sodium (VOLTAREN) 1 % GEL Apply 4 g topically 4 (four) times daily. 350 g 0  . gabapentin (NEURONTIN) 300  MG capsule Take 1 capsule (300 mg total) by mouth at bedtime. 90 capsule 1  . glipiZIDE (GLUCOTROL XL) 2.5 MG 24 hr tablet TAKE 1 TABLET (2.5 MG TOTAL) BY MOUTH IN THE MORNING AND AT BEDTIME. 180 tablet 0  . glucose blood (FREESTYLE LITE) test strip Test once daily dx E10.9 100 each 3  . hydrochlorothiazide (HYDRODIURIL) 25 MG tablet TAKE 1 TABLET BY MOUTH DAILY WITH LOSARTAN POTASSIUM 100MG (COMBINATION TAB NOT AVAILABLE) 90 tablet 3  . Insulin Pen Needle (B-D ULTRAFINE III SHORT PEN) 31G X 8 MM MISC USE WITH LANUTS PEN 100 each 3  . Lancets (FREESTYLE) lancets Use as instructed 100 each  3  . LANTUS SOLOSTAR 100 UNIT/ML Solostar Pen INJECT 35 UNITS INTO THE SKIN DAILY. 15 mL 1  . losartan (COZAAR) 100 MG tablet TAKE 1 TABLET BY MOUTH DAILY WITH HCTZ (COMBINATION TABLET NOT AVAILABLE) 90 tablet 3  . metoprolol tartrate (LOPRESSOR) 50 MG tablet Take 1 tablet (50 mg total) by mouth 2 (two) times daily. 180 tablet 3  . mometasone-formoterol (DULERA) 100-5 MCG/ACT AERO Inhale 2 puffs into the lungs 2 (two) times daily.    . montelukast (SINGULAIR) 10 MG tablet Take 10 mg by mouth at bedtime.     Marland Kitchen Spacer/Aero-Holding Chambers (AEROCHAMBER MAX W/FLOW-VU) MISC by Does not apply route. Use as directed    . tiZANidine (ZANAFLEX) 2 MG tablet TAKE 1 TABLET (2 MG TOTAL) BY MOUTH AT BEDTIME. 90 tablet 0   No current facility-administered medications on file prior to visit.    BP (!) 142/80   Temp 98.6 F (37 C)   Wt 189 lb (85.7 kg)   BMI 29.16 kg/m       Objective:   Physical Exam Vitals and nursing note reviewed.  Constitutional:      Appearance: Normal appearance.  Skin:    General: Skin is warm and dry.  Neurological:     General: No focal deficit present.     Mental Status: He is oriented to person, place, and time.     Gait: Gait abnormal (Did stumble when changing positions from a sitting to a standing position).  Psychiatric:        Mood and Affect: Mood normal.        Behavior:  Behavior normal.        Thought Content: Thought content normal.       Assessment & Plan:  1. Type 2 diabetes mellitus with diabetic neuropathy, with long-term current use of insulin (HCC) Lab Results  Component Value Date   HGBA1C 7.4 (A) 08/25/2020    - POC HgB A1c- 7.4 - not at goal and has increased. Continue with current medications. No changes at this time. Will have him work on diet.  - Follow up in 3 months  - glipiZIDE (GLUCOTROL XL) 2.5 MG 24 hr tablet; Take 1 tablet (2.5 mg total) by mouth in the morning and at bedtime.  Dispense: 180 tablet; Refill: 0  2. Acute midline low back pain without sciatica  - Ambulatory referral to Physical Therapy  3. Gait instability  - Ambulatory referral to Physical Therapy   [name], NP

## 2020-08-25 NOTE — Patient Instructions (Addendum)
It was great seeing you today   I am going to increase your insulin to 24 units  A1c was 7.4   Follow up in 3 months   Someone will call you to schedule your physical therapy appointments

## 2020-08-29 ENCOUNTER — Telehealth: Payer: Self-pay | Admitting: Adult Health

## 2020-08-29 NOTE — Telephone Encounter (Signed)
Patient pharmacy want  to know which medication the patient is supposed to have cream or something else ? Please advise pharmacy has sent over something to our office to advise on Triamcinolone Acetonide 0.1 % 1 application Topical 2 times daily  [place], [place] - [address]  Phone:  [phone] Fax:  [phone]   Dr fry prescribe

## 2020-08-30 ENCOUNTER — Other Ambulatory Visit: Payer: Self-pay | Admitting: Adult Health

## 2020-08-30 NOTE — Telephone Encounter (Signed)
This is a patient of Cory's  

## 2020-08-31 DIAGNOSIS — H3091 Unspecified chorioretinal inflammation, right eye: Secondary | ICD-10-CM | POA: Diagnosis not present

## 2020-08-31 DIAGNOSIS — H0551 Retained (old) foreign body following penetrating wound of right orbit: Secondary | ICD-10-CM | POA: Diagnosis not present

## 2020-08-31 DIAGNOSIS — H54415A Blindness right eye category 5, normal vision left eye: Secondary | ICD-10-CM | POA: Diagnosis not present

## 2020-08-31 DIAGNOSIS — H5711 Ocular pain, right eye: Secondary | ICD-10-CM | POA: Diagnosis not present

## 2020-08-31 DIAGNOSIS — H44521 Atrophy of globe, right eye: Secondary | ICD-10-CM | POA: Diagnosis not present

## 2020-09-08 ENCOUNTER — Other Ambulatory Visit: Payer: Self-pay | Admitting: *Deleted

## 2020-09-12 ENCOUNTER — Other Ambulatory Visit: Payer: Self-pay | Admitting: Adult Health

## 2020-09-12 DIAGNOSIS — I10 Essential (primary) hypertension: Secondary | ICD-10-CM

## 2020-09-13 DIAGNOSIS — I129 Hypertensive chronic kidney disease with stage 1 through stage 4 chronic kidney disease, or unspecified chronic kidney disease: Secondary | ICD-10-CM | POA: Diagnosis not present

## 2020-09-13 DIAGNOSIS — E1122 Type 2 diabetes mellitus with diabetic chronic kidney disease: Secondary | ICD-10-CM | POA: Diagnosis not present

## 2020-09-13 DIAGNOSIS — N1832 Chronic kidney disease, stage 3b: Secondary | ICD-10-CM | POA: Diagnosis not present

## 2020-09-13 DIAGNOSIS — E785 Hyperlipidemia, unspecified: Secondary | ICD-10-CM | POA: Diagnosis not present

## 2020-09-13 DIAGNOSIS — I639 Cerebral infarction, unspecified: Secondary | ICD-10-CM | POA: Diagnosis not present

## 2020-09-14 ENCOUNTER — Other Ambulatory Visit: Payer: Self-pay | Admitting: Adult Health

## 2020-09-14 NOTE — Telephone Encounter (Signed)
SENT TO THE PHARMACY BY E-SCRIBE. 

## 2020-09-14 NOTE — Telephone Encounter (Signed)
Sent to the pharmacy by e-scribe. 

## 2020-09-15 ENCOUNTER — Telehealth: Payer: Self-pay | Admitting: Adult Health

## 2020-09-15 NOTE — Telephone Encounter (Signed)
Noted.  Will forward to [facility] as [place].

## 2020-09-15 NOTE — Telephone Encounter (Signed)
Patient is calling and stated that he was suppose to have physical therapy on 1/24 but due to his recent eye surgery and him not being able to get the prosthetic eye until 2/3, pt wanted to push the therapy back and will call back when he wants to start the therapy again. CB is [phone]

## 2020-09-16 ENCOUNTER — Encounter: Payer: Self-pay | Admitting: Family Medicine

## 2020-09-26 ENCOUNTER — Ambulatory Visit: Payer: HMO | Attending: Adult Health | Admitting: Physical Therapy

## 2020-10-05 ENCOUNTER — Other Ambulatory Visit: Payer: Self-pay | Admitting: Adult Health

## 2020-10-05 DIAGNOSIS — I10 Essential (primary) hypertension: Secondary | ICD-10-CM

## 2020-10-06 NOTE — Telephone Encounter (Signed)
DENIED.  PT IS NO LONGER TAKING THIS MEDICATION. 

## 2020-10-26 ENCOUNTER — Other Ambulatory Visit: Payer: Self-pay | Admitting: Adult Health

## 2020-10-26 DIAGNOSIS — E1140 Type 2 diabetes mellitus with diabetic neuropathy, unspecified: Secondary | ICD-10-CM

## 2020-10-26 NOTE — Telephone Encounter (Signed)
Sent to the pharmacy by e-scribe. 

## 2020-10-26 NOTE — Telephone Encounter (Signed)
Pt is calling in needing a #90 for Continuous blood Gluc Sensor (Freestyle Libre 14 day Sensor) Misc  Pharm:  [facility] on [address]

## 2020-11-03 DIAGNOSIS — Z4420 Encounter for fitting and adjustment of artificial eye, unspecified: Secondary | ICD-10-CM | POA: Diagnosis not present

## 2020-11-09 ENCOUNTER — Other Ambulatory Visit: Payer: Self-pay

## 2020-11-09 ENCOUNTER — Telehealth: Payer: Self-pay | Admitting: Adult Health

## 2020-11-09 DIAGNOSIS — Z1211 Encounter for screening for malignant neoplasm of colon: Secondary | ICD-10-CM

## 2020-11-09 NOTE — Telephone Encounter (Signed)
Referral placed to [facility] in [place].

## 2020-11-09 NOTE — Telephone Encounter (Signed)
Pt is calling in needing a referral for a Colonoscopy at [facility] in [facility] in which he had his last one done at.  Pt would like to have a call once it has been placed.

## 2020-11-17 ENCOUNTER — Telehealth: Payer: Self-pay | Admitting: Adult Health

## 2020-11-17 DIAGNOSIS — E1140 Type 2 diabetes mellitus with diabetic neuropathy, unspecified: Secondary | ICD-10-CM

## 2020-11-17 DIAGNOSIS — Z794 Long term (current) use of insulin: Secondary | ICD-10-CM

## 2020-11-17 MED ORDER — FREESTYLE LIBRE 14 DAY READER DEVI: 0 refills | Status: DC

## 2020-11-17 NOTE — Telephone Encounter (Signed)
Rx done. 

## 2020-11-17 NOTE — Telephone Encounter (Signed)
The Patient is needing Continuous Blood Gluc Receiver (FREESTYLE LIBRE 32 DAY READER) DEVI   His reader is not getting a signal anymore and he needs a new one [facility] to  [id number] [place] [address] Phone:  [phone] Fax:  [phone]

## 2020-11-17 NOTE — Addendum Note (Signed)
Addended by: Agnes Lawrence on: 11/17/2020 04:41 PM   Modules accepted: Orders

## 2020-11-22 ENCOUNTER — Ambulatory Visit: Payer: HMO | Admitting: *Deleted

## 2020-11-22 ENCOUNTER — Other Ambulatory Visit: Payer: Self-pay | Admitting: Adult Health

## 2020-11-22 DIAGNOSIS — Z794 Long term (current) use of insulin: Secondary | ICD-10-CM

## 2020-11-23 ENCOUNTER — Encounter: Payer: HMO | Admitting: Adult Health

## 2020-11-29 DIAGNOSIS — Z79899 Other long term (current) drug therapy: Secondary | ICD-10-CM | POA: Diagnosis not present

## 2020-11-29 DIAGNOSIS — Z1211 Encounter for screening for malignant neoplasm of colon: Secondary | ICD-10-CM | POA: Diagnosis not present

## 2020-12-01 DIAGNOSIS — Q111 Other anophthalmos: Secondary | ICD-10-CM | POA: Diagnosis not present

## 2020-12-01 DIAGNOSIS — Z09 Encounter for follow-up examination after completed treatment for conditions other than malignant neoplasm: Secondary | ICD-10-CM | POA: Diagnosis not present

## 2020-12-01 DIAGNOSIS — H02421 Myogenic ptosis of right eyelid: Secondary | ICD-10-CM | POA: Diagnosis not present

## 2020-12-02 ENCOUNTER — Telehealth: Payer: Self-pay | Admitting: Pharmacist

## 2020-12-02 NOTE — Chronic Care Management (AMB) (Addendum)
Chronic Care Management Pharmacy Assistant   Name: Russell Austin  MRN: [id number] DOB: 04/20/1951   Reason for Encounter: Diabetes Adherence Call      Recent office visits:  08/25/20-Nafziger, [name], NP (OV).  Recent consult visits:  None  Hospital visits:  None in previous 6 months  Medications: Outpatient Encounter Medications as of 12/02/2020  Medication Sig   amLODipine (NORVASC) 10 MG tablet TAKE 1 TABLET BY MOUTH EVERY DAY   aspirin EC 81 MG tablet Take 81 mg by mouth daily.   atorvastatin (LIPITOR) 10 MG tablet TAKE 1 TABLET BY MOUTH EVERY DAY   blood glucose meter kit and supplies Dispense based on Insurance. Test blood sugars once daily. Dx: E10.9   Blood Glucose Monitoring Suppl (FREESTYLE LITE) DEVI Please dispense one Freestyle Lite Glucometer   buPROPion (WELLBUTRIN SR) 150 MG 12 hr tablet TAKE 1 TABLET BY MOUTH TWICE A DAY   Continuous Blood Gluc Receiver (FREESTYLE LIBRE 14 DAY READER) DEVI Use with freestyle libre system   Continuous Blood Gluc Sensor (FREESTYLE LIBRE 14 DAY SENSOR) MISC CHANGE SENSOR EVERY 2 WEEKS   cyclobenzaprine (FLEXERIL) 10 MG tablet Take 1 tablet (10 mg total) by mouth 3 (three) times daily as needed for muscle spasms.   diclofenac Sodium (VOLTAREN) 1 % GEL Apply 4 g topically 4 (four) times daily.   gabapentin (NEURONTIN) 300 MG capsule TAKE 1 CAPSULE BY MOUTH EVERYDAY AT BEDTIME   glipiZIDE (GLUCOTROL XL) 2.5 MG 24 hr tablet TAKE 1 TABLET (2.5 MG TOTAL) BY MOUTH IN THE MORNING AND AT BEDTIME.   glucose blood (FREESTYLE LITE) test strip Test once daily dx E10.9   Insulin Pen Needle (B-D ULTRAFINE III SHORT PEN) 31G X 8 MM MISC USE WITH LANUTS PEN   JARDIANCE 10 MG TABS tablet Take 10 mg by mouth daily.   Lancets (FREESTYLE) lancets Use as instructed   LANTUS SOLOSTAR 100 UNIT/ML Solostar Pen INJECT 35 UNITS INTO THE SKIN DAILY. (Patient taking differently: 18 units nightly and titrate up as needed after starting Jardiance)    losartan (COZAAR) 100 MG tablet TAKE 1 TABLET BY MOUTH DAILY WITH HCTZ (COMBINATION TABLET NOT AVAILABLE)   metoprolol tartrate (LOPRESSOR) 50 MG tablet Take 1 tablet (50 mg total) by mouth 2 (two) times daily.   mometasone-formoterol (DULERA) 100-5 MCG/ACT AERO Inhale 2 puffs into the lungs 2 (two) times daily.   montelukast (SINGULAIR) 10 MG tablet Take 10 mg by mouth at bedtime.    Spacer/Aero-Holding Chambers (AEROCHAMBER MAX W/FLOW-VU) MISC by Does not apply route. Use as directed   tiZANidine (ZANAFLEX) 2 MG tablet TAKE 1 TABLET (2 MG TOTAL) BY MOUTH AT BEDTIME.   No facility-administered encounter medications on file as of 12/02/2020.    Recent Relevant Labs: Lab Results  Component Value Date/Time   HGBA1C 7.4 (A) 08/25/2020 08:00 AM   HGBA1C 6.8 (A) 05/26/2020 07:12 AM   HGBA1C 7.3 (A) 02/11/2020 07:09 AM   HGBA1C 8.1 (H) 11/11/2019 07:44 AM   HGBA1C 7.7 (H) 05/29/2019 02:50 PM   HGBA1C 6.6 07/23/2018 09:00 AM   MICROALBUR 50.8 (H) 12/13/2014 08:01 AM   MICROALBUR 27.7 (H) 12/16/2013 09:38 AM    Kidney Function Lab Results  Component Value Date/Time   CREATININE 2.07 (H) 11/11/2019 07:44 AM   CREATININE 1.99 (H) 10/30/2018 07:56 AM   GFR 38.81 (L) 11/11/2019 07:44 AM   GFRNONAA 41 (L) 10/28/2018 09:12 AM   GFRAA 47 (L) 10/28/2018 09:12 AM    Current antihyperglycemic  regimen:  Insulin glargine (Lantus Solostar), inject 20 units into skin daily Glipizide ER 2.46m , 1 tablet in the morning and at bedtime  What recent interventions/DTPs have been made to improve glycemic control:  Patient reports he is taking medications as directed.  Have there been any recent hospitalizations or ED visits since last visit with CPP? No   Patient reports hypoglycemic symptoms, including  Jittery feeling, stomach was queasy, and clammy skin.    Patient denies hyperglycemic symptoms, including blurry vision, excessive thirst, fatigue, polyuria and weakness   How often are you checking  your blood sugar? twice daily and in the morning before eating or drinking, and one hour or two before or after eating.   What are your blood sugars ranging? Patient reports he has not attempted his second check as of yet. Fasting: 109 Before meals: None After meals: None Bedtime: None  During the week, how often does your blood glucose drop below 70?  Patient reports his blood sugars dropped at 60 about 3 weeks ago.    Are you checking your feet daily/regularly? Patient reports he is checking his feet daily. Patient stated he has some swelling that come and goes on right side but it has gotten better today.Patient stated he was told by PCP he has good circulation in both feet.  Adherence Review: Is the patient currently on a STATIN medication? Yes   Is the patient currently on ACE/ARB medication? Yes   Does the patient have >5 day gap between last estimated fill dates? Yes   Star Rating Drugs: Atorvastatin 10 MG tablet: 90 DS, [facility] Glipizide 2.5 MG 24 hr tablet: [facility] Jardiance 10 MG tablet: 30 DS, [facility] Losartan 100 MG tablet: 90 DS, [facility] Patient denied recent refill 10/05/20 no longer taking this medication.   Note: Patient reports he just filled up his pill pack for 2 weeks and he may be needing refills. Patient stated he will need refills of Jardiance 10 MG.   MJeni Salles CPP Notified.  CRaynelle Highland CWabashaPharmacist Assistant [phone] Total Time: 475Minutes

## 2020-12-05 ENCOUNTER — Other Ambulatory Visit: Payer: Self-pay

## 2020-12-05 ENCOUNTER — Telehealth: Payer: Self-pay

## 2020-12-05 ENCOUNTER — Other Ambulatory Visit: Payer: Self-pay | Admitting: Adult Health

## 2020-12-05 DIAGNOSIS — E1140 Type 2 diabetes mellitus with diabetic neuropathy, unspecified: Secondary | ICD-10-CM

## 2020-12-05 DIAGNOSIS — Z794 Long term (current) use of insulin: Secondary | ICD-10-CM

## 2020-12-05 NOTE — Telephone Encounter (Signed)
-----   Message from Viona Gilmore, [facility] sent at 12/02/2020  4:59 PM EDT ----- Regarding: Jardiance refill Hi,  Can you please send in a refill for Jardiance 10 mg for [name]? He is requesting a 90 day supply instead of 30.  Thank you, Maddie

## 2020-12-06 ENCOUNTER — Other Ambulatory Visit: Payer: Self-pay

## 2020-12-06 ENCOUNTER — Other Ambulatory Visit: Payer: Self-pay | Admitting: Adult Health

## 2020-12-06 DIAGNOSIS — I10 Essential (primary) hypertension: Secondary | ICD-10-CM

## 2020-12-06 MED ORDER — JARDIANCE 10 MG PO TABS: 10.0000 mg | ORAL_TABLET | Freq: Every day | ORAL | 0 refills | Status: DC

## 2020-12-06 MED ORDER — AMLODIPINE BESYLATE 10 MG PO TABS: 10.0000 mg | ORAL_TABLET | Freq: Every day | ORAL | 0 refills | Status: DC

## 2020-12-06 NOTE — Telephone Encounter (Signed)
[facility] requesting 90 day supply for Jardiance

## 2020-12-08 ENCOUNTER — Telehealth: Payer: Self-pay | Admitting: *Deleted

## 2020-12-08 MED ORDER — JARDIANCE 10 MG PO TABS: 10.0000 mg | ORAL_TABLET | Freq: Every day | ORAL | 0 refills | Status: DC

## 2020-12-08 NOTE — Telephone Encounter (Signed)
-----   Message from Viona Gilmore, [facility] sent at 12/08/2020 11:48 AM EDT ----- Regarding: FW: Jardiance refill Hi,  I sent this request a few days ago and it looks like it was accidentally sent to [facility]? [facility] goes [facility] and doesn't fill with [facility], sorry if there was any confusion on that one.  Thanks, Maddie ----- Message ----- From: Viona Gilmore, [facility] Sent: 12/02/2020   5:01 PM EDT To: [facility] Subject: Jardiance refill                               Hi,  Can you please send in a refill for Jardiance 10 mg for Mr. Grundman? He is requesting a 90 day supply instead of 30.  Thank you, Maddie

## 2020-12-08 NOTE — Telephone Encounter (Signed)
Rx done. 

## 2020-12-09 ENCOUNTER — Other Ambulatory Visit: Payer: Self-pay

## 2020-12-09 ENCOUNTER — Encounter: Payer: Self-pay | Admitting: Adult Health

## 2020-12-09 ENCOUNTER — Telehealth: Payer: Self-pay | Admitting: Adult Health

## 2020-12-09 ENCOUNTER — Ambulatory Visit (INDEPENDENT_AMBULATORY_CARE_PROVIDER_SITE_OTHER): Payer: HMO | Admitting: Adult Health

## 2020-12-09 VITALS — BP 120/70 | HR 58 | Temp 97.7°F | Ht 67.0 in | Wt 180.6 lb

## 2020-12-09 DIAGNOSIS — Z0000 Encounter for general adult medical examination without abnormal findings: Secondary | ICD-10-CM | POA: Diagnosis not present

## 2020-12-09 DIAGNOSIS — G8191 Hemiplegia, unspecified affecting right dominant side: Secondary | ICD-10-CM | POA: Diagnosis not present

## 2020-12-09 DIAGNOSIS — N400 Enlarged prostate without lower urinary tract symptoms: Secondary | ICD-10-CM

## 2020-12-09 DIAGNOSIS — N182 Chronic kidney disease, stage 2 (mild): Secondary | ICD-10-CM | POA: Diagnosis not present

## 2020-12-09 DIAGNOSIS — E1122 Type 2 diabetes mellitus with diabetic chronic kidney disease: Secondary | ICD-10-CM

## 2020-12-09 DIAGNOSIS — Z794 Long term (current) use of insulin: Secondary | ICD-10-CM

## 2020-12-09 DIAGNOSIS — I10 Essential (primary) hypertension: Secondary | ICD-10-CM

## 2020-12-09 DIAGNOSIS — E1140 Type 2 diabetes mellitus with diabetic neuropathy, unspecified: Secondary | ICD-10-CM | POA: Diagnosis not present

## 2020-12-09 DIAGNOSIS — I6302 Cerebral infarction due to thrombosis of basilar artery: Secondary | ICD-10-CM

## 2020-12-09 DIAGNOSIS — G6289 Other specified polyneuropathies: Secondary | ICD-10-CM

## 2020-12-09 DIAGNOSIS — E782 Mixed hyperlipidemia: Secondary | ICD-10-CM | POA: Diagnosis not present

## 2020-12-09 LAB — COMPREHENSIVE METABOLIC PANEL
ALT: 16 U/L (ref 0–53)
AST: 17 U/L (ref 0–37)
Albumin: 3.7 g/dL (ref 3.5–5.2)
Alkaline Phosphatase: 135 U/L — ABNORMAL HIGH (ref 39–117)
BUN: 53 mg/dL — ABNORMAL HIGH (ref 6–23)
CO2: 26 mEq/L (ref 19–32)
Calcium: 8.6 mg/dL (ref 8.4–10.5)
Chloride: 103 mEq/L (ref 96–112)
Creatinine, Ser: 3.82 mg/dL — ABNORMAL HIGH (ref 0.40–1.50)
GFR: 15.4 mL/min — ABNORMAL LOW (ref >60.00)
Glucose, Bld: 95 mg/dL (ref 70–99)
Potassium: 4.2 mEq/L (ref 3.5–5.1)
Sodium: 136 mEq/L (ref 135–145)
Total Bilirubin: 1 mg/dL (ref 0.2–1.2)
Total Protein: 6.3 g/dL (ref 6.0–8.3)

## 2020-12-09 LAB — CBC WITH DIFFERENTIAL/PLATELET
Basophils Absolute: 0 10*3/uL (ref 0.0–0.1)
Basophils Relative: 0.9 % (ref 0.0–3.0)
Eosinophils Absolute: 0.1 10*3/uL (ref 0.0–0.7)
Eosinophils Relative: 2.5 % (ref 0.0–5.0)
HCT: 37 % — ABNORMAL LOW (ref 39.0–52.0)
Hemoglobin: 12.2 g/dL — ABNORMAL LOW (ref 13.0–17.0)
Lymphocytes Relative: 30.3 % (ref 12.0–46.0)
Lymphs Abs: 1.1 10*3/uL (ref 0.7–4.0)
MCHC: 32.9 g/dL (ref 30.0–36.0)
MCV: 89.5 fl (ref 78.0–100.0)
Monocytes Absolute: 0.4 10*3/uL (ref 0.1–1.0)
Monocytes Relative: 10.2 % (ref 3.0–12.0)
Neutro Abs: 2 10*3/uL (ref 1.4–7.7)
Neutrophils Relative %: 56.1 % (ref 43.0–77.0)
Platelets: 258 10*3/uL (ref 150.0–400.0)
RBC: 4.13 Mil/uL — ABNORMAL LOW (ref 4.22–5.81)
RDW: 13.5 % (ref 11.5–15.5)
WBC: 3.7 10*3/uL — ABNORMAL LOW (ref 4.0–10.5)

## 2020-12-09 LAB — LIPID PANEL
Cholesterol: 98 mg/dL (ref 0–200)
HDL: 30.2 mg/dL — ABNORMAL LOW (ref >39.00)
LDL Cholesterol: 48 mg/dL (ref 0–99)
NonHDL: 67.97
Total CHOL/HDL Ratio: 3
Triglycerides: 101 mg/dL (ref 0.0–149.0)
VLDL: 20.2 mg/dL (ref 0.0–40.0)

## 2020-12-09 LAB — PSA: PSA: 0.93 ng/mL (ref 0.10–4.00)

## 2020-12-09 LAB — TSH: TSH: 2.03 u[IU]/mL (ref 0.35–4.50)

## 2020-12-09 LAB — HEMOGLOBIN A1C: Hgb A1c MFr Bld: 7.1 % — ABNORMAL HIGH (ref 4.6–6.5)

## 2020-12-09 MED ORDER — LANTUS SOLOSTAR 100 UNIT/ML ~~LOC~~ SOPN: PEN_INJECTOR | SUBCUTANEOUS | 1 refills | Status: DC

## 2020-12-09 MED ORDER — ATORVASTATIN CALCIUM 10 MG PO TABS: 1.0000 | ORAL_TABLET | Freq: Every day | ORAL | 3 refills | Status: DC

## 2020-12-09 MED ORDER — TIZANIDINE HCL 2 MG PO TABS: 2.0000 mg | ORAL_TABLET | Freq: Every day | ORAL | 1 refills | Status: DC

## 2020-12-09 NOTE — Telephone Encounter (Signed)
Spoke with the patients wife and she stated that she wanted more information regarding the signs of early dementia concerning the patient. Patient's wife stated that she has noticed bursts of anger, forgetfulness and trouble processing from the patient. Please advise.

## 2020-12-09 NOTE — Telephone Encounter (Signed)
Poke to patient's wife and informed her of the labs.  His A1c has dropped to 7.1 from 7.4 but since starting Jardiance his GFR has decreased from 30-15 and Cr increased to 3.85  This is likely due to not drinking enough water while being on Jardiance.  We will have him come off Jardiance, increase his fluids.  Advise staying away from nephrotoxic agents.  He does have a follow-up appointment with his nephrologist early next week.

## 2020-12-09 NOTE — Patient Instructions (Signed)
It was great seeing you today   Please start walking.   I will follow up with you regarding your lab work   See you back in 3 months or sooner if needed  Have a great time in [facility]

## 2020-12-09 NOTE — Progress Notes (Signed)
Subjective:    Patient ID: Russell Austin, male    DOB: 28-Dec-1950, 70 y.o.   MRN: [id number]  HPI Patient presents for yearly preventative medicine examination. 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  DM -he is currently prescribed Lantus 18 units daily as well as glipizide ER 2.5 mg twice daily. He was most recently started on Jardiance 10 mg daily in January 2022.   He uses the Marvin system to monitor his blood sugars, per his meter, over the last 90 days his average glucose has been 159, he is in goal 25% of the time and 52% of the time he is above goal.  He is unable to exercise frequently due to associated diabetic neuropathy and weakness from CVA in the past.  His diet continues to be an issue as he does foods high in carbs and sugars.  Lab Results  Component Value Date   HGBA1C 7.4 (A) 08/25/2020   Essential hypertension-early prescribed Norvasc 10 mg, Hyzaar 100 mg - 25 mg, and metoprolol 50 mg twice daily.  He denies dizziness, lightheadedness, chest pain, shortness of breath.  He does not monitor his blood pressure at home despite having a blood pressure cuff. BP Readings from Last 3 Encounters:  12/09/20 120/70  08/25/20 (!) 142/80  08/09/20 124/80   Hyperlipidemia -prescribed Lipitor 10 mg daily. He denies myalgia or fatigue  Lab Results  Component Value Date   CHOL 115 11/11/2019   HDL 35.00 (L) 11/11/2019   LDLCALC 60 11/11/2019   TRIG 100.0 11/11/2019   CHOLHDL 3 11/11/2019   Lower Extremity Edema - controlled with Lasix 20 mg daily.  Has also noticed improvement since starting Jardiance.  History of CVA -in June 2016.  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  Currently prescribed  gabapentin 300 mg at bedtime and Tizanidine 2 mg QHS  BPH- Symptoms absent  All immunizations and health maintenance protocols were reviewed with the patient and needed orders were placed. Will get shingles and Tdap at pharmacy   Appropriate screening laboratory values were ordered for the patient including screening of hyperlipidemia, renal function and hepatic function. If indicated by BPH, a PSA was ordered.  Medication reconciliation,  past medical history, social history, problem list and allergies were reviewed in detail with the patient  Goals were established with regard to weight loss, exercise, and  diet in compliance with medications  Wt Readings from Last 3 Encounters:  12/09/20 180 lb 9.6 oz (81.9 kg)  08/25/20 189 lb (85.7 kg)  08/09/20 186 lb (84.4 kg)    Review of Systems  Constitutional: Negative.   HENT: Negative.   Eyes: Positive for visual disturbance (Blind in right eye ).  Respiratory: Negative.   Cardiovascular: Positive for leg swelling.  Gastrointestinal: Negative.   Endocrine: Negative.   Genitourinary: Negative.   Musculoskeletal: Positive for arthralgias, gait problem and myalgias.  Skin: Negative.   Allergic/Immunologic: Negative.   Hematological: Negative.   Psychiatric/Behavioral: Negative.   All other systems reviewed and are negative.  Past Medical History:  Diagnosis Date  . 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 Diabetes mellitus type II   . Diabetic retinopathy FOLLOWED BY DR Zadie Rhine  . ED (erectile dysfunction)   .  Glaucoma of both eyes   . Hypertension   . Left hydrocele   . Stroke [facility])     Social History   Socioeconomic History  . Marital status: Married    Spouse name: Not on file  . Number of children: 1  . Years of education: 17  . Highest education level: Not on file  Occupational History  . Occupation: Disabled  Tobacco Use  . Smoking status: Never Smoker  . Smokeless tobacco: Never Used   Substance and Sexual Activity  . Alcohol use: No  . Drug use: No  . Sexual activity: Not on file  Other Topics Concern  . Not on file  Social History Narrative   Lives at home with his wife and granddaughter.   Left-handed.   3 cups caffeine per day.   Social Determinants of Health   Financial Resource Strain: Low Risk   . Difficulty of Paying Living Expenses: Not hard at all  Food Insecurity: No Food Insecurity  . Worried About Charity fundraiser in the Last Year: Never true  . Ran Out of Food in the Last Year: Never true  Transportation Needs: No Transportation Needs  . Lack of Transportation (Medical): No  . Lack of Transportation (Non-Medical): No  Physical Activity: Not on file  Stress: Not on file  Social Connections: Not on file  Intimate Partner Violence: Not on file    Past Surgical History:  Procedure Laterality Date  . APPENDECTOMY  AGE EARLY 20'S  . HYDROCELE EXCISION  03/31/2012   Procedure: HYDROCELECTOMY ADULT;  Surgeon: Franchot Gallo, MD;  Location: [facility];  Service: Urology;  Laterality: Left;  45 MINS    . LEFT EYE LASER RETINA REPAIR  SEPT 2012  . RIGHT EYE VITRECTOMY/ INSERTION GLAUCOMA SETON/ LASER REPAIR  12-13-2008   RETINAL ARTERY OCCLUSION /NEOVASCULAR GLAUCOMA/ HEMORRHAGE  . RIGHT EYE VITRETOMY/ INSERTION GLAUCOMA SETON X2/ LASER  03-24-2009   RECURRENT HEMORRHAGE/ OCCLUSION INTERNAL SETON  . SHOULDER ARTHROSCOPY Right 2005  . undescended right testicle removed  1994    Family History  Problem Relation Age of Onset  . Glaucoma Mother   . Diabetes Mother   . Stomach cancer Maternal Grandfather   . Stroke Maternal Grandmother     Allergies  Allergen Reactions  . Lactose Intolerance (Gi) Diarrhea  . Apple Pectin [Pectin] Itching    ITCHY THROAT  . Metformin And Related     D/t decreased kidney function   . Peach [Prunus Persica] Itching    ITCHY THROAT  . Morphine And Related Anxiety    Jittery    Current  Outpatient Medications on File Prior to Visit  Medication Sig Dispense Refill  . amLODipine (NORVASC) 10 MG tablet Take 1 tablet (10 mg total) by mouth daily. 90 tablet 0  . aspirin EC 81 MG tablet Take 81 mg by mouth daily.    Marland Kitchen atorvastatin (LIPITOR) 10 MG tablet TAKE 1 TABLET BY MOUTH EVERY DAY 90 tablet 3  . blood glucose meter kit and supplies Dispense based on Insurance. Test blood sugars once daily. Dx: E10.9 1 each 0  . Blood Glucose Monitoring Suppl (FREESTYLE LITE) DEVI Please dispense one Freestyle Lite Glucometer 1 kit 0  . buPROPion (WELLBUTRIN SR) 150 MG 12 hr tablet TAKE 1 TABLET BY MOUTH TWICE A DAY 180 tablet 0  . Continuous Blood Gluc Receiver (FREESTYLE LIBRE 14 DAY READER) DEVI Use with freestyle libre system 1 each 0  . Continuous Blood Gluc  Sensor (FREESTYLE LIBRE 14 DAY SENSOR) MISC CHANGE SENSOR EVERY 2 WEEKS 6 each 3  . diclofenac Sodium (VOLTAREN) 1 % GEL Apply 4 g topically 4 (four) times daily. 350 g 0  . gabapentin (NEURONTIN) 300 MG capsule TAKE 1 CAPSULE BY MOUTH EVERYDAY AT BEDTIME 90 capsule 0  . glipiZIDE (GLUCOTROL XL) 2.5 MG 24 hr tablet TAKE 1 TABLET (2.5 MG TOTAL) BY MOUTH IN THE MORNING AND AT BEDTIME. 180 tablet 0  . glucose blood (FREESTYLE LITE) test strip Test once daily dx E10.9 100 each 3  . Insulin Pen Needle (B-D ULTRAFINE III SHORT PEN) 31G X 8 MM MISC USE WITH LANUTS PEN 100 each 3  . JARDIANCE 10 MG TABS tablet Take 1 tablet (10 mg total) by mouth daily. 90 tablet 0  . Lancets (FREESTYLE) lancets Use as instructed 100 each 3  . LANTUS SOLOSTAR 100 UNIT/ML Solostar Pen INJECT 35 UNITS INTO THE SKIN DAILY. (Patient taking differently: 18 units nightly and titrate up as needed after starting Jardiance) 15 mL 1  . losartan (COZAAR) 100 MG tablet TAKE 1 TABLET BY MOUTH DAILY WITH HCTZ (COMBINATION TABLET NOT AVAILABLE) 90 tablet 3  . metoprolol tartrate (LOPRESSOR) 50 MG tablet Take 1 tablet (50 mg total) by mouth 2 (two) times daily. 180 tablet 3  .  mometasone-formoterol (DULERA) 100-5 MCG/ACT AERO Inhale 2 puffs into the lungs 2 (two) times daily.    . montelukast (SINGULAIR) 10 MG tablet Take 10 mg by mouth at bedtime.     Marland Kitchen Spacer/Aero-Holding Chambers (AEROCHAMBER MAX W/FLOW-VU) MISC by Does not apply route. Use as directed    . tiZANidine (ZANAFLEX) 2 MG tablet TAKE 1 TABLET (2 MG TOTAL) BY MOUTH AT BEDTIME. 90 tablet 0   No current facility-administered medications on file prior to visit.    BP 120/70 (BP Location: Left Arm, Patient Position: Sitting, Cuff Size: Normal)   Pulse (!) 58   Temp 97.7 F (36.5 C) (Oral)   Ht 5' 7"  (1.702 m)   Wt 180 lb 9.6 oz (81.9 kg)   SpO2 99%   BMI 28.29 kg/m       Objective:   Physical Exam Vitals and nursing note reviewed.  Constitutional:      General: He is not in acute distress.    Appearance: Normal appearance. He is well-developed and normal weight.  HENT:     Head: Normocephalic and atraumatic.     Right Ear: Tympanic membrane, ear canal and external ear normal. There is no impacted cerumen.     Left Ear: Tympanic membrane, ear canal and external ear normal. There is no impacted cerumen.     Nose: Nose normal. No congestion or rhinorrhea.     Mouth/Throat:     Mouth: Mucous membranes are moist.     Pharynx: Oropharynx is clear. No oropharyngeal exudate or posterior oropharyngeal erythema.  Eyes:     General:        Right eye: No discharge.        Left eye: No discharge.     Extraocular Movements: Extraocular movements intact.     Conjunctiva/sclera: Conjunctivae normal.     Pupils: Pupils are equal, round, and reactive to light.  Neck:     Vascular: No carotid bruit.     Trachea: No tracheal deviation.  Cardiovascular:     Rate and Rhythm: Normal rate and regular rhythm.     Pulses: Normal pulses.     Heart sounds: Normal heart sounds. No  murmur heard. No friction rub. No gallop.   Pulmonary:     Effort: Pulmonary effort is normal. No respiratory distress.      Breath sounds: Normal breath sounds. No stridor. No wheezing, rhonchi or rales.  Chest:     Chest wall: No tenderness.  Abdominal:     General: Bowel sounds are normal. There is no distension.     Palpations: Abdomen is soft. There is no mass.     Tenderness: There is no abdominal tenderness. There is no right CVA tenderness, left CVA tenderness, guarding or rebound.     Hernia: No hernia is present.  Musculoskeletal:        General: No swelling, tenderness, deformity or signs of injury. Normal range of motion.     Right lower leg: No edema.     Left lower leg: No edema.  Lymphadenopathy:     Cervical: No cervical adenopathy.  Skin:    General: Skin is warm and dry.     Capillary Refill: Capillary refill takes less than 2 seconds.     Coloration: Skin is not jaundiced or pale.     Findings: No bruising, erythema, lesion or rash.  Neurological:     General: No focal deficit present.     Mental Status: He is alert and oriented to person, place, and time.     Cranial Nerves: No cranial nerve deficit.     Sensory: No sensory deficit.     Motor: Weakness and abnormal muscle tone present.     Coordination: Coordination normal.     Gait: Gait abnormal.     Deep Tendon Reflexes: Reflexes normal.     Comments: Right sided spastic hemipalagia   Psychiatric:        Mood and Affect: Mood normal.        Behavior: Behavior normal.        Thought Content: Thought content normal.        Judgment: Judgment normal.       Assessment & Plan:  1. Routine general medical examination at a health care facility - Follow up in one year or sooner if needed - Needs to work on exercising more and eating less cabs and sugars  - CBC with Differential/Platelet; Future - Comprehensive metabolic panel; Future - Hemoglobin A1c; Future - Lipid panel; Future - TSH; Future - TSH - Lipid panel - Hemoglobin A1c - Comprehensive metabolic panel - CBC with Differential/Platelet  2. Type 2 diabetes mellitus  with diabetic neuropathy, with long-term current use of insulin (HCC) - Consider increase in Jardiance  - Follow up in three months  - CBC with Differential/Platelet; Future - Comprehensive metabolic panel; Future - Hemoglobin A1c; Future - Lipid panel; Future - TSH; Future - TSH - Lipid panel - Hemoglobin A1c - Comprehensive metabolic panel - CBC with Differential/Platelet  3. Essential hypertension - Well controlled. No change in medications  - CBC with Differential/Platelet; Future - Comprehensive metabolic panel; Future - Hemoglobin A1c; Future - Lipid panel; Future - TSH; Future - TSH - Lipid panel - Hemoglobin A1c - Comprehensive metabolic panel - CBC with Differential/Platelet  4. Mixed hyperlipidemia - Consider increase in statin  - CBC with Differential/Platelet; Future - Comprehensive metabolic panel; Future - Hemoglobin A1c; Future - Lipid panel; Future - TSH; Future - TSH - Lipid panel - Hemoglobin A1c - Comprehensive metabolic panel - CBC with Differential/Platelet  5. CKD stage 2 due to type 2 diabetes mellitus ([place]) - Continue to monitor  -  CBC with Differential/Platelet; Future - Comprehensive metabolic panel; Future - Hemoglobin A1c; Future - Lipid panel; Future - TSH; Future - TSH - Lipid panel - Hemoglobin A1c - Comprehensive metabolic panel - CBC with Differential/Platelet  6. Right hemiparesis ([place]) - Continue with Zanaflex and Gabapentin QHS - CBC with Differential/Platelet; Future - Comprehensive metabolic panel; Future - Hemoglobin A1c; Future - Lipid panel; Future - TSH; Future - tiZANidine (ZANAFLEX) 2 MG tablet; Take 1 tablet (2 mg total) by mouth at bedtime.  Dispense: 90 tablet; Refill: 1 - TSH - Lipid panel - Hemoglobin A1c - Comprehensive metabolic panel - CBC with Differential/Platelet  7. Benign prostatic hyperplasia without lower urinary tract symptoms  - PSA; Future - PSA  8. Other polyneuropathy - Continue  with gabapentin  - CBC with Differential/Platelet; Future - Comprehensive metabolic panel; Future - Hemoglobin A1c; Future - Lipid panel; Future - TSH; Future - TSH - Lipid panel - Hemoglobin A1c - Comprehensive metabolic panel - CBC with Differential/Platelet  9. 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

## 2020-12-09 NOTE — Telephone Encounter (Signed)
Patient wife and stated that she was calling back to speak tot he provider regarding patient's recent visit, please advise. CB is [phone]

## 2020-12-19 DIAGNOSIS — E785 Hyperlipidemia, unspecified: Secondary | ICD-10-CM | POA: Diagnosis not present

## 2020-12-19 DIAGNOSIS — N401 Enlarged prostate with lower urinary tract symptoms: Secondary | ICD-10-CM | POA: Diagnosis not present

## 2020-12-19 DIAGNOSIS — N184 Chronic kidney disease, stage 4 (severe): Secondary | ICD-10-CM | POA: Diagnosis not present

## 2020-12-19 DIAGNOSIS — E1122 Type 2 diabetes mellitus with diabetic chronic kidney disease: Secondary | ICD-10-CM | POA: Diagnosis not present

## 2020-12-19 DIAGNOSIS — I129 Hypertensive chronic kidney disease with stage 1 through stage 4 chronic kidney disease, or unspecified chronic kidney disease: Secondary | ICD-10-CM | POA: Diagnosis not present

## 2020-12-19 DIAGNOSIS — I639 Cerebral infarction, unspecified: Secondary | ICD-10-CM | POA: Diagnosis not present

## 2020-12-23 DIAGNOSIS — K635 Polyp of colon: Secondary | ICD-10-CM | POA: Diagnosis not present

## 2020-12-23 DIAGNOSIS — Z1211 Encounter for screening for malignant neoplasm of colon: Secondary | ICD-10-CM | POA: Diagnosis not present

## 2020-12-23 DIAGNOSIS — Z01818 Encounter for other preprocedural examination: Secondary | ICD-10-CM | POA: Diagnosis not present

## 2020-12-23 DIAGNOSIS — E119 Type 2 diabetes mellitus without complications: Secondary | ICD-10-CM | POA: Diagnosis not present

## 2020-12-27 DIAGNOSIS — K635 Polyp of colon: Secondary | ICD-10-CM | POA: Diagnosis not present

## 2020-12-28 ENCOUNTER — Telehealth: Payer: Self-pay | Admitting: Adult Health

## 2020-12-28 NOTE — Telephone Encounter (Signed)
Patient's wife brought in paperwork for a parking placard. She is requesting a call at [phone] when completed.  Paperwork will be in folder.  Please advise.

## 2020-12-28 NOTE — Telephone Encounter (Signed)
Placed in Cory's folder

## 2020-12-30 HISTORY — PX: COLONOSCOPY: SHX5424

## 2021-01-08 ENCOUNTER — Other Ambulatory Visit: Payer: Self-pay | Admitting: Adult Health

## 2021-01-11 ENCOUNTER — Other Ambulatory Visit: Payer: Self-pay

## 2021-01-11 ENCOUNTER — Ambulatory Visit (INDEPENDENT_AMBULATORY_CARE_PROVIDER_SITE_OTHER): Payer: HMO

## 2021-01-11 DIAGNOSIS — Z0000 Encounter for general adult medical examination without abnormal findings: Secondary | ICD-10-CM | POA: Diagnosis not present

## 2021-01-11 NOTE — Progress Notes (Addendum)
Virtual Visit via Telephone Note  I connected with  Russell Austin on 01/11/21 at  8:45 AM EDT by telephone and verified that I am speaking with the correct person using two identifiers.  Medicare Annual Wellness visit completed telephonically due to Covid-19 pandemic.   Persons participating in this call: This Health Coach and this patient.   Location: Patient: Home Provider: Office   I discussed the limitations, risks, security and privacy concerns of performing an evaluation and management service by telephone and the availability of in person appointments. The patient expressed understanding and agreed to proceed.  Unable to perform video visit due to video visit attempted and failed and/or patient does not have video capability.   Some vital signs may be absent or patient reported.   [name], LPN    Subjective:   Russell Austin is a 70 y.o. male who presents for an Initial Medicare Annual Wellness Visit.  Review of Systems     Cardiac Risk Factors include: advanced age (>65mn, >>73women);diabetes mellitus;male gender;hypertension     Objective:    There were no vitals filed for this visit. There is no height or weight on file to calculate BMI.  Advanced Directives 01/11/2021 12/30/2019 10/28/2018 04/24/2016 03/20/2016 12/30/2015 10/07/2015  Does Patient Have a Medical Advance Directive? Yes No No No No No No  Type of Advance Directive H[place]ba Does patient want to make changes to medical advance directive? - - - - - - -  Copy of [place] Chart? No - copy requested - - - - - -  Would patient like information on creating a medical advance directive? - Yes (MAU/Ambulatory/Procedural Areas - Information given) Yes (ED - Information included in AVS) - - Yes - Educational materials given Yes - Educational materials given    Current Medications (verified) Outpatient Encounter Medications as of 01/11/2021   Medication Sig  . amLODipine (NORVASC) 10 MG tablet TAKE 1 TABLET BY MOUTH EVERY DAY  . aspirin EC 81 MG tablet Take 81 mg by mouth daily.  .Marland Kitchenatorvastatin (LIPITOR) 10 MG tablet Take 1 tablet (10 mg total) by mouth daily.  .Marland KitchenbuPROPion (WELLBUTRIN SR) 150 MG 12 hr tablet TAKE 1 TABLET BY MOUTH TWICE A DAY  . Continuous Blood Gluc Receiver (FREESTYLE LIBRE 14 DAY READER) DEVI Use with freestyle libre system  . Continuous Blood Gluc Sensor (FREESTYLE LIBRE 14 DAY SENSOR) MISC CHANGE SENSOR EVERY 2 WEEKS  . gabapentin (NEURONTIN) 300 MG capsule TAKE 1 CAPSULE BY MOUTH EVERYDAY AT BEDTIME  . glipiZIDE (GLUCOTROL XL) 2.5 MG 24 hr tablet TAKE 1 TABLET (2.5 MG TOTAL) BY MOUTH IN THE MORNING AND AT BEDTIME.  . insulin glargine (LANTUS SOLOSTAR) 100 UNIT/ML Solostar Pen INJECT 35 UNITS INTO THE SKIN DAILY. (Patient taking differently: 20 Units. INJECT 35 UNITS INTO THE SKIN DAILY.)  . Insulin Pen Needle (B-D ULTRAFINE III SHORT PEN) 31G X 8 MM MISC USE WITH LANUTS PEN  . losartan (COZAAR) 100 MG tablet TAKE 1 TABLET BY MOUTH DAILY WITH HCTZ (COMBINATION TABLET NOT AVAILABLE)  . metoprolol tartrate (LOPRESSOR) 50 MG tablet Take 1 tablet (50 mg total) by mouth 2 (two) times daily.  .Marland KitchenSpacer/Aero-Holding Chambers (AEROCHAMBER MAX W/FLOW-VU) MISC by Does not apply route. Use as directed  . tiZANidine (ZANAFLEX) 2 MG tablet Take 1 tablet (2 mg total) by mouth at bedtime.  . diclofenac Sodium (VOLTAREN) 1 % GEL Apply  4 g topically 4 (four) times daily. (Patient not taking: Reported on 01/11/2021)  . mometasone-formoterol (DULERA) 100-5 MCG/ACT AERO Inhale 2 puffs into the lungs 2 (two) times daily. (Patient not taking: Reported on 01/11/2021)  . montelukast (SINGULAIR) 10 MG tablet Take 10 mg by mouth at bedtime.  (Patient not taking: Reported on 01/11/2021)   No facility-administered encounter medications on file as of 01/11/2021.    Allergies (verified) Lactose intolerance (gi), Apple pectin [pectin],  Metformin and related, Peach [prunus persica], and Morphine and related   History: Past Medical History:  Diagnosis Date  . 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 Diabetes mellitus type II   . Diabetic retinopathy FOLLOWED BY DR Zadie Rhine  . ED (erectile dysfunction)   . Glaucoma of both eyes   . Hypertension   . Left hydrocele   . Stroke [facility])    Past Surgical History:  Procedure Laterality Date  . APPENDECTOMY  AGE EARLY 20'S  . COLONOSCOPY  12/30/2020   every 5 years  . HYDROCELE EXCISION  03/31/2012   Procedure: HYDROCELECTOMY ADULT;  Surgeon: Franchot Gallo, MD;  Location: [facility];  Service: Urology;  Laterality: Left;  45 MINS    . LEFT EYE LASER RETINA REPAIR  SEPT 2012  . RIGHT EYE VITRECTOMY/ INSERTION GLAUCOMA SETON/ LASER REPAIR  12-13-2008   RETINAL ARTERY OCCLUSION /NEOVASCULAR GLAUCOMA/ HEMORRHAGE  . RIGHT EYE VITRETOMY/ INSERTION GLAUCOMA SETON X2/ LASER  03-24-2009   RECURRENT HEMORRHAGE/ OCCLUSION INTERNAL SETON  . SHOULDER ARTHROSCOPY Right 2005  . undescended right testicle removed  1994   Family History  Problem Relation Age of Onset  . Glaucoma Mother   . Diabetes Mother   . Stomach cancer Maternal Grandfather   . Stroke Maternal Grandmother    Social History   Socioeconomic History  . Marital status: Married    Spouse name: Not on file  . Number of children: 1  . Years of education: 28  . Highest education level: Not on file  Occupational History  . Occupation: Disabled  Tobacco Use  . Smoking status: Never Smoker  . Smokeless tobacco: Never Used  Substance and Sexual Activity  . Alcohol use: No  . Drug use: No  . Sexual activity: Not on file  Other Topics Concern  . Not on file  Social History Narrative   Lives at home with his wife and granddaughter.   Left-handed.   3 cups caffeine per day.   Social Determinants of Health   Financial Resource Strain: Low Risk   . Difficulty of Paying Living  Expenses: Not hard at all  Food Insecurity: No Food Insecurity  . Worried About Charity fundraiser in the Last Year: Never true  . Ran Out of Food in the Last Year: Never true  Transportation Needs: No Transportation Needs  . Lack of Transportation (Medical): No  . Lack of Transportation (Non-Medical): No  Physical Activity: Inactive  . Days of Exercise per Week: 0 days  . Minutes of Exercise per Session: 0 min  Stress: No Stress Concern Present  . Feeling of Stress : Not at all  Social Connections: Socially Integrated  . Frequency of Communication with Friends and Family: More than three times a week  . Frequency of Social Gatherings with Friends and Family: Twice a week  . Attends Religious Services: More than 4 times per year  . Active Member of Clubs or Organizations: Yes  . Attends Archivist Meetings: 1  to 4 times per year  . Marital Status: Married    Tobacco Counseling Counseling given: Not Answered   Clinical Intake:  Pre-visit preparation completed: Yes  Pain : No/denies pain     BMI - recorded: 28.29 Nutritional Status: BMI 25 -29 Overweight Nutritional Risks: None Diabetes: Yes CBG done?: Yes (53 at the start of the morning and then ate to increase) CBG resulted in Enter/ Edit results?: No Did pt. bring in CBG monitor from home?: No  How often do you need to have someone help you when you read instructions, pamphlets, or other written materials from your doctor or pharmacy?: 1 - Never  Diabetic?Nutrition Risk Assessment:  Has the patient had any N/V/D within the last 2 months?  No  Does the patient have any non-healing wounds?  No  Has the patient had any unintentional weight loss or weight gain?  No   Diabetes:  Is the patient diabetic?  Yes  If diabetic, was a CBG obtained today?  Yes  Did the patient bring in their glucometer from home?  No  How often do you monitor your CBG's? Daily.   Financial Strains and Diabetes Management:  Are  you having any financial strains with the device, your supplies or your medication? No .  Does the patient want to be seen by Chronic Care Management for management of their diabetes?  No  Would the patient like to be referred to a Nutritionist or for Diabetic Management?  No   Diabetic Exams:   Diabetic Eye Exam: Overdue for diabetic eye exam. Pt has been advised about the importance in completing this exam. Patient advised to call and schedule an eye exam. Diabetic Foot Exam: Overdue, Pt has been advised about the importance in completing this exam. Pt is scheduled for diabetic foot exam on next appt .   Interpreter Needed?: No  Information entered by :: Charlott Rakes, LPN   Activities of Daily Living In your present state of health, do you have any difficulty performing the following activities: 01/11/2021  Hearing? Y  Vision? N  Difficulty concentrating or making decisions? N  Walking or climbing stairs? N  Dressing or bathing? N  Doing errands, shopping? N  Preparing Food and eating ? N  Using the Toilet? N  In the past six months, have you accidently leaked urine? N  Do you have problems with loss of bowel control? N  Managing your Medications? N  Managing your Finances? N  Housekeeping or managing your Housekeeping? N  Some recent data might be hidden    Patient Care Team: [name], NP as PCP - General (Family Medicine) [name], Darrick Grinder, MD as Consulting Physician (Allergy and Immunology) [facility] (Ophthalmology) Marshall Cork, MD (Ophthalmology) Zada Finders Joyice Faster, MD as Consulting Physician (Neurosurgery) Earnie Larsson, [facility] as Pharmacist (Pharmacist) [name], MD as Consulting Physician (Nephrology) [facility] (Plastic Surgery)  Indicate any recent Medical Services you may have received from other than [facility] providers in the past year (date may be approximate).     Assessment:   This  is a routine wellness examination for [place].  Hearing/Vision screen  Hearing Screening   '125Hz'$  '250Hz'$  '500Hz'$  '1000Hz'$  '2000Hz'$  '3000Hz'$  '4000Hz'$  '6000Hz'$  '8000Hz'$   Right ear:           Left ear:           Comments: Pt mild loss  Vision Screening Comments: Pt follows up with Dr Baird Cancer for annual eye eye  exams   Dietary issues and exercise activities discussed: Current Exercise Habits: The patient does not participate in regular exercise at present  Goals Addressed            This Visit's Progress   . Patient Stated       Lose weight       Depression Screen PHQ 2/9 Scores 01/11/2021 12/09/2020 12/30/2019 11/11/2019 10/15/2018 10/10/2017 06/19/2016  PHQ - 2 Score 0 0 0 0 - 5 0  PHQ- 9 Score - 1 - - - 19 -  Exception Documentation - - - - Patient refusal - -    Fall Risk Fall Risk  01/11/2021 12/09/2020 11/11/2019 04/06/2019 10/10/2017  Falls in the past year? 0 1 0 (No Data) Yes  Comment - - - Emmi Telephone Survey: data to providers prior to load -  Number falls in past yr: 0 0 - (No Data) 1  Comment - - - Emmi Telephone Survey Actual Response =  -  Injury with Fall? 0 0 - - -  Risk for fall due to : Impaired vision;Impaired mobility;Impaired balance/gait - - - Impaired balance/gait  Follow up Falls prevention discussed - - - Falls evaluation completed;Falls prevention discussed;Education provided    FALL RISK PREVENTION PERTAINING TO THE HOME:  Any stairs in or around the home? Yes  If so, are there any without handrails? No  Home free of loose throw rugs in walkways, pet beds, electrical cords, etc? Yes  Adequate lighting in your home to reduce risk of falls? Yes   ASSISTIVE DEVICES UTILIZED TO PREVENT FALLS:  Life alert? No  Use of a cane, walker or w/c? No  Grab bars in the bathroom? No  Shower chair or bench in shower? No  Elevated toilet seat or a handicapped toilet? No   TIMED UP AND GO:  Was the test performed? No .     Cognitive Function:     6CIT Screen 01/11/2021  What  Year? 0 points  What month? 0 points  Count back from 20 0 points  Months in reverse 4 points  Repeat phrase 4 points    Immunizations Immunization History  Administered Date(s) Administered  . Fluad Quad(high Dose 65+) 05/14/2019, 05/26/2020  . Influenza Split 07/02/2011, 07/17/2012  . Influenza, High Dose Seasonal PF 05/29/2017  . Influenza,inj,Quad PF,6+ Mos 05/13/2013, 06/15/2015, 06/19/2016, 06/30/2018  . Influenza-Unspecified 06/30/2018  . PFIZER(Purple Top)SARS-COV-2 Vaccination 09/25/2019, 10/16/2019, 06/07/2020  . Pneumococcal Conjugate-13 10/10/2017  . Pneumococcal Polysaccharide-23 12/20/2014, 11/11/2019  . Td 02/02/2010     Flu Vaccine status: Up to date  Pneumococcal vaccine status: Up to date  Covid-19 vaccine status: Completed vaccines  Qualifies for Shingles Vaccine? Yes   Zostavax completed No   Shingrix Completed?: No.    Education has been provided regarding the importance of this vaccine. Patient has been advised to call insurance company to determine out of pocket expense if they have not yet received this vaccine. Advised may also receive vaccine at local pharmacy or Health Dept. Verbalized acceptance and understanding.  Screening Tests Health Maintenance  Topic Date Due  . FOOT EXAM  10/16/2019  . OPHTHALMOLOGY EXAM  10/14/2020  . INFLUENZA VACCINE  04/03/2021  . HEMOGLOBIN A1C  06/10/2021  . COLONOSCOPY (Pts 45-1yr Insurance coverage will need to be confirmed)  12/31/2030  . COVID-19 Vaccine  Completed  . Hepatitis C Screening  Completed  . PNA vac Low Risk Adult  Completed  . HPV VACCINES  Aged Out  Health Maintenance  Health Maintenance Due  Topic Date Due  . FOOT EXAM  10/16/2019  . OPHTHALMOLOGY EXAM  10/14/2020    Colorectal cancer screening: Type of screening: Colonoscopy. Completed 09/03/14. Repeat every 10 years   Additional Screening:  Hepatitis C Screening:  Completed 10/10/17  Vision Screening: Recommended annual  ophthalmology exams for early detection of glaucoma and other disorders of the eye. Is the patient up to date with their annual eye exam?  Yes  Who is the provider or what is the name of the office in which the patient attends annual eye exams? Dr Baird Cancer  If pt is not established with a provider, would they like to be referred to a provider to establish care? No .   Dental Screening: Recommended annual dental exams for proper oral hygiene  Community Resource Referral / Chronic Care Management: CRR required this visit?  No   CCM required this visit?  No      Plan:     I have personally reviewed and noted the following in the patient's chart:   . Medical and social history . Use of alcohol, tobacco or illicit drugs  . Current medications and supplements including opioid prescriptions. Patient is not currently taking opioid prescriptions. . Functional ability and status . Nutritional status . Physical activity . Advanced directives . List of other physicians . Hospitalizations, surgeries, and ER visits in previous 12 months . Vitals . Screenings to include cognitive, depression, and falls . Referrals and appointments  In addition, I have reviewed and discussed with patient certain preventive protocols, quality metrics, and best practice recommendations. A written personalized care plan for preventive services as well as general preventive health recommendations were provided to patient.     [name], LPN   [id number]   Nurse Notes: None

## 2021-01-11 NOTE — Patient Instructions (Addendum)
Russell Austin , Thank you for taking time to come for your Medicare Wellness Visit. I appreciate your ongoing commitment to your health goals. Please review the following plan we discussed and let me know if I can assist you in the future.   Screening recommendations/referrals: Colonoscopy: Done 09/03/14 Recommended yearly ophthalmology/optometry visit for glaucoma screening and checkup Recommended yearly dental visit for hygiene and checkup  Vaccinations: Influenza vaccine: Up to date Pneumococcal vaccine: Up to date Tdap vaccine: Not a canadidate Shingles vaccine: Shingrix discussed. Please contact your pharmacy for coverage information.    Covid-19: Completed 1/22, 2/12, 06/07/20  Advanced directives: Please bring a copy of your health care power of attorney and living will to the office at your convenience.  Conditions/risks identified: Lose weight  Next appointment: Follow up in one year for your annual wellness visit.   Preventive Care 70 Years and Older, Male Preventive care refers to lifestyle choices and visits with your health care provider that can promote health and wellness. What does preventive care include?  A yearly physical exam. This is also called an annual well check.  Dental exams once or twice a year.  Routine eye exams. Ask your health care provider how often you should have your eyes checked.  Personal lifestyle choices, including:  Daily care of your teeth and gums.  Regular physical activity.  Eating a healthy diet.  Avoiding tobacco and drug use.  Limiting alcohol use.  Practicing safe sex.  Taking low doses of aspirin every day.  Taking vitamin and mineral supplements as recommended by your health care provider. What happens during an annual well check? The services and screenings done by your health care provider during your annual well check will depend on your age, overall health, lifestyle risk factors, and family history of  disease. Counseling  Your health care provider may ask you questions about your:  Alcohol use.  Tobacco use.  Drug use.  Emotional well-being.  Home and relationship well-being.  Sexual activity.  Eating habits.  History of falls.  Memory and ability to understand (cognition).  Work and work Statistician. Screening  You may have the following tests or measurements:  Height, weight, and BMI.  Blood pressure.  Lipid and cholesterol levels. These may be checked every 5 years, or more frequently if you are over 86 years old.  Skin check.  Lung cancer screening. You may have this screening every year starting at age 55 if you have a 30-pack-year history of smoking and currently smoke or have quit within the past 15 years.  Fecal occult blood test (FOBT) of the stool. You may have this test every year starting at age 9.  Flexible sigmoidoscopy or colonoscopy. You may have a sigmoidoscopy every 5 years or a colonoscopy every 10 years starting at age 38.  Prostate cancer screening. Recommendations will vary depending on your family history and other risks.  Hepatitis C blood test.  Hepatitis B blood test.  Sexually transmitted disease (STD) testing.  Diabetes screening. This is done by checking your blood sugar (glucose) after you have not eaten for a while (fasting). You may have this done every 1-3 years.  Abdominal aortic aneurysm (AAA) screening. You may need this if you are a current or former smoker.  Osteoporosis. You may be screened starting at age 60 if you are at high risk. Talk with your health care provider about your test results, treatment options, and if necessary, the need for more tests. Vaccines  Your health care  provider may recommend certain vaccines, such as:  Influenza vaccine. This is recommended every year.  Tetanus, diphtheria, and acellular pertussis (Tdap, Td) vaccine. You may need a Td booster every 10 years.  Zoster vaccine. You may  need this after age 60.  Pneumococcal 13-valent conjugate (PCV13) vaccine. One dose is recommended after age 1.  Pneumococcal polysaccharide (PPSV23) vaccine. One dose is recommended after age 37. Talk to your health care provider about which screenings and vaccines you need and how often you need them. This information is not intended to replace advice given to you by your health care provider. Make sure you discuss any questions you have with your health care provider. Document Released: 09/16/2015 Document Revised: 05/09/2016 Document Reviewed: 06/21/2015 Elsevier Interactive Patient Education  2017 [place] Prevention in the Home Falls can cause injuries. They can happen to people of all ages. There are many things you can do to make your home safe and to help prevent falls. What can I do on the outside of my home?  Regularly fix the edges of walkways and driveways and fix any cracks.  Remove anything that might make you trip as you walk through a door, such as a raised step or threshold.  Trim any bushes or trees on the path to your home.  Use bright outdoor lighting.  Clear any walking paths of anything that might make someone trip, such as rocks or tools.  Regularly check to see if handrails are loose or broken. Make sure that both sides of any steps have handrails.  Any raised decks and porches should have guardrails on the edges.  Have any leaves, snow, or ice cleared regularly.  Use sand or salt on walking paths during winter.  Clean up any spills in your garage right away. This includes oil or grease spills. What can I do in the bathroom?  Use night lights.  Install grab bars by the toilet and in the tub and shower. Do not use towel bars as grab bars.  Use non-skid mats or decals in the tub or shower.  If you need to sit down in the shower, use a plastic, non-slip stool.  Keep the floor dry. Clean up any water that spills on the floor as soon as it  happens.  Remove soap buildup in the tub or shower regularly.  Attach bath mats securely with double-sided non-slip rug tape.  Do not have throw rugs and other things on the floor that can make you trip. What can I do in the bedroom?  Use night lights.  Make sure that you have a light by your bed that is easy to reach.  Do not use any sheets or blankets that are too big for your bed. They should not hang down onto the floor.  Have a firm chair that has side arms. You can use this for support while you get dressed.  Do not have throw rugs and other things on the floor that can make you trip. What can I do in the kitchen?  Clean up any spills right away.  Avoid walking on wet floors.  Keep items that you use a lot in easy-to-reach places.  If you need to reach something above you, use a strong step stool that has a grab bar.  Keep electrical cords out of the way.  Do not use floor polish or wax that makes floors slippery. If you must use wax, use non-skid floor wax.  Do not have throw  rugs and other things on the floor that can make you trip. What can I do with my stairs?  Do not leave any items on the stairs.  Make sure that there are handrails on both sides of the stairs and use them. Fix handrails that are broken or loose. Make sure that handrails are as long as the stairways.  Check any carpeting to make sure that it is firmly attached to the stairs. Fix any carpet that is loose or worn.  Avoid having throw rugs at the top or bottom of the stairs. If you do have throw rugs, attach them to the floor with carpet tape.  Make sure that you have a light switch at the top of the stairs and the bottom of the stairs. If you do not have them, ask someone to add them for you. What else can I do to help prevent falls?  Wear shoes that:  Do not have high heels.  Have rubber bottoms.  Are comfortable and fit you well.  Are closed at the toe. Do not wear sandals.  If you  use a stepladder:  Make sure that it is fully opened. Do not climb a closed stepladder.  Make sure that both sides of the stepladder are locked into place.  Ask someone to hold it for you, if possible.  Clearly mark and make sure that you can see:  Any grab bars or handrails.  First and last steps.  Where the edge of each step is.  Use tools that help you move around (mobility aids) if they are needed. These include:  Canes.  Walkers.  Scooters.  Crutches.  Turn on the lights when you go into a dark area. Replace any light bulbs as soon as they burn out.  Set up your furniture so you have a clear path. Avoid moving your furniture around.  If any of your floors are uneven, fix them.  If there are any pets around you, be aware of where they are.  Review your medicines with your doctor. Some medicines can make you feel dizzy. This can increase your chance of falling. Ask your doctor what other things that you can do to help prevent falls. This information is not intended to replace advice given to you by your health care provider. Make sure you discuss any questions you have with your health care provider. Document Released: 06/16/2009 Document Revised: 01/26/2016 Document Reviewed: 09/24/2014 Elsevier Interactive Patient Education  2017 [employer].

## 2021-01-21 ENCOUNTER — Other Ambulatory Visit: Payer: Self-pay | Admitting: Adult Health

## 2021-01-25 DIAGNOSIS — E113592 Type 2 diabetes mellitus with proliferative diabetic retinopathy without macular edema, left eye: Secondary | ICD-10-CM | POA: Diagnosis not present

## 2021-01-25 DIAGNOSIS — H43822 Vitreomacular adhesion, left eye: Secondary | ICD-10-CM | POA: Diagnosis not present

## 2021-02-06 ENCOUNTER — Telehealth: Payer: Self-pay | Admitting: Adult Health

## 2021-02-06 ENCOUNTER — Other Ambulatory Visit: Payer: Self-pay

## 2021-02-06 DIAGNOSIS — G8191 Hemiplegia, unspecified affecting right dominant side: Secondary | ICD-10-CM

## 2021-02-06 MED ORDER — TIZANIDINE HCL 2 MG PO TABS: 2.0000 mg | ORAL_TABLET | Freq: Every day | ORAL | 1 refills | Status: DC

## 2021-02-06 NOTE — Telephone Encounter (Signed)
Patient calling requesting refill for tiZANidine (ZANAFLEX) 2 MG tablet.  [id number] [id number] Phone:  [phone]:  [phone]

## 2021-02-06 NOTE — Telephone Encounter (Signed)
Rx refilled.

## 2021-02-20 ENCOUNTER — Encounter: Payer: Self-pay | Admitting: Family Medicine

## 2021-02-20 ENCOUNTER — Other Ambulatory Visit: Payer: Self-pay

## 2021-02-20 ENCOUNTER — Ambulatory Visit (INDEPENDENT_AMBULATORY_CARE_PROVIDER_SITE_OTHER): Payer: HMO | Admitting: Family Medicine

## 2021-02-20 VITALS — BP 150/70 | HR 93 | Temp 99.4°F | Wt 184.2 lb

## 2021-02-20 DIAGNOSIS — S90422A Blister (nonthermal), left great toe, initial encounter: Secondary | ICD-10-CM | POA: Diagnosis not present

## 2021-02-20 DIAGNOSIS — S90425A Blister (nonthermal), left lesser toe(s), initial encounter: Secondary | ICD-10-CM | POA: Diagnosis not present

## 2021-02-20 NOTE — Patient Instructions (Signed)
Follow up immediately for any increased redness, swelling, warmth, or pus-like drainage  Clean daily with soap and water  May apply small amount of vaseline to 1st and 3rd toes.    Avoid tight fitting shoes and keep pressure off toes as much as possible.

## 2021-02-20 NOTE — Progress Notes (Signed)
Blister   Established Patient Office Visit  Subjective:  Patient ID: Russell Austin, male    DOB: 19-Apr-1951  Age: 70 y.o. MRN: [id number]  CC:  Chief Complaint  Patient presents with   Blister    3 blisters on l foot, first 3 toes, x 4 days    HPI Russell Austin presents for blisters left foot.  He and his wife just got back from [facility] and they are out there do a lot of walking recently.  He had gotten a new pair of tennis shoes.  On Thursday he noticed some slight soreness and we took his shoe off and noticed blister medial aspect left great toe and also blister involving the third toe on the side of the toe.  He and his wife got some type of occlusive dressing.  They have not noted any signs of secondary infection.  He is type II diabetic.  Recent A1c 7.1%.  Non-smoker.  No known peripheral vascular disease.  No recent claudication symptoms.  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   Diabetes mellitus type II    Diabetic retinopathy FOLLOWED BY DR Zadie Rhine   ED (erectile dysfunction)    Glaucoma of both eyes    Hypertension    Left hydrocele    Stroke [facility])     Past Surgical History:  Procedure Laterality Date   APPENDECTOMY  AGE EARLY 20'S   COLONOSCOPY  12/30/2020   every 5 years   HYDROCELE EXCISION  03/31/2012   Procedure: HYDROCELECTOMY ADULT;  Surgeon: Franchot Gallo, MD;  Location: [facility];  Service: Urology;  Laterality: Left;  51 MINS     LEFT EYE LASER RETINA REPAIR  SEPT 2012   RIGHT EYE VITRECTOMY/ INSERTION GLAUCOMA SETON/ LASER REPAIR  12-13-2008   RETINAL ARTERY OCCLUSION /NEOVASCULAR GLAUCOMA/ HEMORRHAGE   RIGHT EYE VITRETOMY/ INSERTION GLAUCOMA SETON X2/ LASER  03-24-2009   RECURRENT HEMORRHAGE/ OCCLUSION INTERNAL SETON   SHOULDER ARTHROSCOPY Right 2005   undescended right testicle removed  1994    Family  History  Problem Relation Age of Onset   Glaucoma Mother    Diabetes Mother    Stomach cancer Maternal Grandfather    Stroke Maternal Grandmother     Social History   Socioeconomic History   Marital status: Married    Spouse name: Not on file   Number of children: 1   Years of education: 12   Highest education level: Not on file  Occupational History   Occupation: Disabled  Tobacco Use   Smoking status: Never   Smokeless tobacco: Never  Substance and Sexual Activity   Alcohol use: No   Drug use: No   Sexual activity: Not on file  Other Topics Concern   Not on file  Social History Narrative   Lives at home with his wife and granddaughter.   Left-handed.   3 cups caffeine per day.   Social Determinants of Health   Financial Resource Strain: Low Risk    Difficulty of Paying Living Expenses: Not hard at all  Food Insecurity: No Food Insecurity   Worried About Charity fundraiser in the Last Year: Never true   [place] in the Last Year: Never true  Transportation Needs: No Transportation Needs   Lack of Transportation (Medical): No   Lack of Transportation (Non-Medical): No  Physical  Activity: Inactive   Days of Exercise per Week: 0 days   Minutes of Exercise per Session: 0 min  Stress: No Stress Concern Present   Feeling of Stress : Not at all  Social Connections: Socially Integrated   Frequency of Communication with Friends and Family: More than three times a week   Frequency of Social Gatherings with Friends and Family: Twice a week   Attends Religious Services: More than 4 times per year   Active Member of [employer] or Organizations: Yes   Attends Archivist Meetings: 1 to 4 times per year   Marital Status: Married  Human resources officer Violence: Not At Risk   Fear of Current or Ex-Partner: No   Emotionally Abused: No   Physically Abused: No   Sexually Abused: No    Outpatient Medications Prior to Visit  Medication Sig Dispense Refill    amLODipine (NORVASC) 10 MG tablet TAKE 1 TABLET BY MOUTH EVERY DAY 90 tablet 2   aspirin EC 81 MG tablet Take 81 mg by mouth daily.     atorvastatin (LIPITOR) 10 MG tablet Take 1 tablet (10 mg total) by mouth daily. 90 tablet 3   buPROPion (WELLBUTRIN SR) 150 MG 12 hr tablet TAKE 1 TABLET BY MOUTH TWICE A DAY 180 tablet 0   Continuous Blood Gluc Receiver (FREESTYLE LIBRE 14 DAY READER) DEVI Use with freestyle libre system 1 each 0   Continuous Blood Gluc Sensor (FREESTYLE LIBRE 14 DAY SENSOR) MISC CHANGE SENSOR EVERY 2 WEEKS 6 each 3   diclofenac Sodium (VOLTAREN) 1 % GEL Apply 4 g topically 4 (four) times daily. 350 g 0   gabapentin (NEURONTIN) 300 MG capsule TAKE 1 CAPSULE BY MOUTH EVERYDAY AT BEDTIME 90 capsule 0   glipiZIDE (GLUCOTROL XL) 2.5 MG 24 hr tablet TAKE 1 TABLET (2.5 MG TOTAL) BY MOUTH IN THE MORNING AND AT BEDTIME. 180 tablet 0   hydrochlorothiazide (HYDRODIURIL) 25 MG tablet TAKE 1 TABLET BY MOUTH DAILY WITH LOSARTAN POTASSIUM '100MG'$  (COMBINATION TAB NOT AVAILABLE) 90 tablet 3   insulin glargine (LANTUS SOLOSTAR) 100 UNIT/ML Solostar Pen INJECT 35 UNITS INTO THE SKIN DAILY. (Patient taking differently: 20 Units. INJECT 35 UNITS INTO THE SKIN DAILY.) 15 mL 1   Insulin Pen Needle (B-D ULTRAFINE III SHORT PEN) 31G X 8 MM MISC USE WITH LANUTS PEN 100 each 3   losartan (COZAAR) 100 MG tablet TAKE 1 TABLET BY MOUTH DAILY WITH HCTZ (COMBINATION TABLET NOT AVAILABLE) 90 tablet 3   metoprolol tartrate (LOPRESSOR) 50 MG tablet Take 1 tablet (50 mg total) by mouth 2 (two) times daily. 180 tablet 3   mometasone-formoterol (DULERA) 100-5 MCG/ACT AERO Inhale 2 puffs into the lungs 2 (two) times daily.     montelukast (SINGULAIR) 10 MG tablet Take 10 mg by mouth at bedtime.     Spacer/Aero-Holding Chambers (AEROCHAMBER MAX W/FLOW-VU) MISC by Does not apply route. Use as directed     tiZANidine (ZANAFLEX) 2 MG tablet Take 1 tablet (2 mg total) by mouth at bedtime. 90 tablet 1   No  facility-administered medications prior to visit.    Allergies  Allergen Reactions   Lactose Intolerance (Gi) Diarrhea   Apple Pectin [Pectin] Itching    ITCHY THROAT   Metformin And Related     D/t decreased kidney function    Peach [Prunus Persica] Itching    ITCHY THROAT   Morphine And Related Anxiety    Jittery    ROS Review of Systems  Constitutional:  Negative for chills and fever.     Objective:    Physical Exam Vitals reviewed.  Constitutional:      Appearance: Normal appearance.  Cardiovascular:     Rate and Rhythm: Normal rate and regular rhythm.  Skin:    Comments: Left great toe reveals ruptured vesicle on the medial aspect.  No warmth.  No erythema.  No purulent drainage.  No foul odor.  Slightly bloody serosanguineous drainage.  He has a similar small blister left third toe medially again with no signs of secondary infection.  Feet are warm to touch with excellent dorsalis pedis pulse.  Good capillary refill throughout.  Normal sensory function with monofilament testing throughout  Neurological:     Mental Status: He is alert.    BP (!) 150/70 (BP Location: Left Arm, Patient Position: Sitting, Cuff Size: Normal)   Pulse 93   Temp 99.4 F (37.4 C) (Oral)   Wt 184 lb 3.2 oz (83.6 kg)   SpO2 97%   BMI 28.85 kg/m  Wt Readings from Last 3 Encounters:  02/20/21 184 lb 3.2 oz (83.6 kg)  12/09/20 180 lb 9.6 oz (81.9 kg)  08/25/20 189 lb (85.7 kg)     Health Maintenance Due  Topic Date Due   Zoster Vaccines- Shingrix (1 of 2) Never done   FOOT EXAM  10/16/2019   COVID-19 Vaccine (4 - Booster for Pfizer series) 10/08/2020   OPHTHALMOLOGY EXAM  10/14/2020    There are no preventive care reminders to display for this patient.  Lab Results  Component Value Date   TSH 2.03 12/09/2020   Lab Results  Component Value Date   WBC 3.7 (L) 12/09/2020   HGB 12.2 (L) 12/09/2020   HCT 37.0 (L) 12/09/2020   MCV 89.5 12/09/2020   PLT 258.0 12/09/2020    Lab Results  Component Value Date   NA 136 12/09/2020   K 4.2 12/09/2020   CO2 26 12/09/2020   GLUCOSE 95 12/09/2020   BUN 53 (H) 12/09/2020   CREATININE 3.82 (H) 12/09/2020   BILITOT 1.0 12/09/2020   ALKPHOS 135 (H) 12/09/2020   AST 17 12/09/2020   ALT 16 12/09/2020   PROT 6.3 12/09/2020   ALBUMIN 3.7 12/09/2020   CALCIUM 8.6 12/09/2020   ANIONGAP 7 10/28/2018   GFR 15.40 (L) 12/09/2020   Lab Results  Component Value Date   CHOL 98 12/09/2020   Lab Results  Component Value Date   HDL 30.20 (L) 12/09/2020   Lab Results  Component Value Date   LDLCALC 48 12/09/2020   Lab Results  Component Value Date   TRIG 101.0 12/09/2020   Lab Results  Component Value Date   CHOLHDL 3 12/09/2020   Lab Results  Component Value Date   HGBA1C 7.1 (H) 12/09/2020      Assessment & Plan:   Friction blisters involving the left first and third toes.  No signs of secondary infection.  Patient has had type 2 diabetes but no major neuropathy and evidence for excellent blood flow in both feet.  Diabetes reasonably well controlled.  -Discussed good wound care with keeping feet clean with soap and water daily -Avoid any tight fitting shoes. -May apply small amount of Vaseline to skin daily -Follow-up immediately for any signs of infection such as redness, warmth, puslike drainage, swelling  No orders of the defined types were placed in this encounter.   Follow-up: No follow-ups on file.    Carolann Littler, MD

## 2021-02-21 ENCOUNTER — Telehealth: Payer: Self-pay | Admitting: Pharmacist

## 2021-02-21 NOTE — Chronic Care Management (AMB) (Deleted)
Chronic Care Management Pharmacy Assistant   Name: Russell Austin  MRN: [id number] DOB: Sep 13, 1950  Reason for Encounter: Disease State   Conditions to be addressed/monitored: DMII  Recent office visits:  06.20.2022 [name], MD patient seen for an acute issue, blister on left feet. 05.11.2022 [name], LPN Medicare Wellness Exam 04.08.2022 [name], NP patient seen for annual exam: labs ordered. Medication discontinued cyclobenzaprine (FLEXERIL) 10 MG tablet.  Recent consult visits:  None  Hospital visits:  None in previous 6 months  Medications: Outpatient Encounter Medications as of 02/21/2021  Medication Sig   amLODipine (NORVASC) 10 MG tablet TAKE 1 TABLET BY MOUTH EVERY DAY   aspirin EC 81 MG tablet Take 81 mg by mouth daily.   atorvastatin (LIPITOR) 10 MG tablet Take 1 tablet (10 mg total) by mouth daily.   buPROPion (WELLBUTRIN SR) 150 MG 12 hr tablet TAKE 1 TABLET BY MOUTH TWICE A DAY   Continuous Blood Gluc Receiver (FREESTYLE LIBRE 14 DAY READER) DEVI Use with freestyle libre system   Continuous Blood Gluc Sensor (FREESTYLE LIBRE 14 DAY SENSOR) MISC CHANGE SENSOR EVERY 2 WEEKS   diclofenac Sodium (VOLTAREN) 1 % GEL Apply 4 g topically 4 (four) times daily.   gabapentin (NEURONTIN) 300 MG capsule TAKE 1 CAPSULE BY MOUTH EVERYDAY AT BEDTIME   glipiZIDE (GLUCOTROL XL) 2.5 MG 24 hr tablet TAKE 1 TABLET (2.5 MG TOTAL) BY MOUTH IN THE MORNING AND AT BEDTIME.   hydrochlorothiazide (HYDRODIURIL) 25 MG tablet TAKE 1 TABLET BY MOUTH DAILY WITH LOSARTAN POTASSIUM '100MG'$  (COMBINATION TAB NOT AVAILABLE)   insulin glargine (LANTUS SOLOSTAR) 100 UNIT/ML Solostar Pen INJECT 35 UNITS INTO THE SKIN DAILY. (Patient taking differently: 20 Units. INJECT 35 UNITS INTO THE SKIN DAILY.)   Insulin Pen Needle (B-D ULTRAFINE III SHORT PEN) 31G X 8 MM MISC USE WITH LANUTS PEN   losartan (COZAAR) 100 MG tablet TAKE 1 TABLET BY MOUTH DAILY WITH HCTZ (COMBINATION  TABLET NOT AVAILABLE)   metoprolol tartrate (LOPRESSOR) 50 MG tablet Take 1 tablet (50 mg total) by mouth 2 (two) times daily.   mometasone-formoterol (DULERA) 100-5 MCG/ACT AERO Inhale 2 puffs into the lungs 2 (two) times daily.   montelukast (SINGULAIR) 10 MG tablet Take 10 mg by mouth at bedtime.   Spacer/Aero-Holding Chambers (AEROCHAMBER MAX W/FLOW-VU) MISC by Does not apply route. Use as directed   tiZANidine (ZANAFLEX) 2 MG tablet Take 1 tablet (2 mg total) by mouth at bedtime.   No facility-administered encounter medications on file as of 02/21/2021.    Recent Relevant Labs: Lab Results  Component Value Date/Time   HGBA1C 7.1 (H) 12/09/2020 08:04 AM   HGBA1C 7.4 (A) 08/25/2020 08:00 AM   HGBA1C 6.8 (A) 05/26/2020 07:12 AM   HGBA1C 7.3 (A) 02/11/2020 07:09 AM   HGBA1C 8.1 (H) 11/11/2019 07:44 AM   HGBA1C 6.6 07/23/2018 09:00 AM   MICROALBUR 50.8 (H) 12/13/2014 08:01 AM   MICROALBUR 27.7 (H) 12/16/2013 09:38 AM    Kidney Function Lab Results  Component Value Date/Time   CREATININE 3.82 (H) 12/09/2020 08:04 AM   CREATININE 2.07 (H) 11/11/2019 07:44 AM   GFR 15.40 (L) 12/09/2020 08:04 AM   GFRNONAA 41 (L) 10/28/2018 09:12 AM   GFRAA 47 (L) 10/28/2018 09:12 AM   Current antihyperglycemic regimen:  Insulin glargine (LANTUS SOLOSTAR) 100 UNIT/ML Solostar Pen Glipizide (GLUCOTROL XL) 2.5 MG 24 hr tablet What recent interventions/DTPs have been made to improve glycemic control:  None Have there been  any recent hospitalizations or ED visits since last visit with CPP? No Patient denies hypoglycemic symptoms, including Pale, Sweaty, Shaky, Hungry, Nervous/irritable, and Vision changes Patient reports hyperglycemic symptoms, including none How often are you checking your blood sugar? once daily What are your blood sugars ranging?  Fasting:  06.17 86 06.18 110 06.19 100 06.20 84 06.21 120 Before meals:  After meals:  Bedtime:  During the week, how often does your blood  glucose drop below 70? Infrequently Are you checking your feet daily/regularly? Yes  Adherence Review: Is the patient currently on a STATIN medication? Yes Is the patient currently on ACE/ARB medication? No Does the patient have >5 day gap between last estimated fill dates? No  I spoke with the patient about medication adherence. She states that she has been doing well. She does not have any current issues with her medications. She is not experiencing any side effects. The patient was originally scheduled for June 22nd for a CCM appointment, and it was moved to August 17th, 2022, at 9:00 AM via telephone. She states that she has not started any new medications. She continues to take her medications as prescribed. There have been no emergency department or urgent care visits since her last CPP or PCP visit. She does not have any issues with her pharmacy. The patient also wanted to schedule follow-up appointments with her primary care doctor. Lab work for her annual physical was scheduled for November the 2nd 2022 at 8:00 AM and a physical with her PCP was scheduled for November the 9th at 11 AM.  Star Rating Drugs: Medication Dispensed  Quantity Pharmacy  Atorvastatin 10 [facility]22 [facility]  Glipizide 2.5 mg [facility].22.2022 [facility]   Amilia (Mimi) Mare Ferrari, [place] Pharmacist Assistant [phone]

## 2021-02-22 DIAGNOSIS — Q111 Other anophthalmos: Secondary | ICD-10-CM | POA: Diagnosis not present

## 2021-02-22 DIAGNOSIS — H00023 Hordeolum internum right eye, unspecified eyelid: Secondary | ICD-10-CM | POA: Diagnosis not present

## 2021-02-23 ENCOUNTER — Telehealth (INDEPENDENT_AMBULATORY_CARE_PROVIDER_SITE_OTHER): Payer: HMO | Admitting: Family Medicine

## 2021-02-23 DIAGNOSIS — U071 COVID-19: Secondary | ICD-10-CM

## 2021-02-23 NOTE — Patient Instructions (Signed)
  HOME CARE TIPS:  -can use nasal saline a few times per day if you have nasal congestion; sometimes  a short course of Afrin nasal spray for 3 days can help with symptoms as well  -stay hydrated, drink plenty of fluids and eat small healthy meals - avoid dairy  -stay home while sick, except to seek medical care. If you have COVID19, ideally it would be best to stay home for a full 10 days since the onset of symptoms PLUS one day of no fever and feeling better. Wear a good mask that fits snugly (such as N95 or KN95) if around others to reduce the risk of transmission.  It was nice to meet you today, and I really hope you are feeling better soon. I help [facility] out with telemedicine visits on Tuesdays and Thursdays and am available for visits on those days. If you have any concerns or questions following this visit please schedule a follow up visit with your Primary Care doctor or seek care at a local urgent care clinic to avoid delays in care.    Seek in person care or schedule a follow up video visit promptly if your symptoms worsen, new concerns arise or you are not improving with treatment. Call 911 and/or seek emergency care if your symptoms are severe or life threatening.

## 2021-02-23 NOTE — Progress Notes (Signed)
Virtual Visit via Telephone Note  I connected with Russell Austin on 02/23/21 at 10:20 AM EDT by telephone and verified that I am speaking with the correct person using two identifiers.   I discussed the limitations, risks, security and privacy concerns of performing an evaluation and management service by telephone and the availability of in person appointments. I also discussed with the patient that there may be a patient responsible charge related to this service. The patient expressed understanding and agreed to proceed.  Location patient: home, [place] Location provider: work or home office Participants present for the call: patient, provider, patient's wife Patient did not have a visit with me in the prior 7 days to address this/these issue(s).   History of Present Illness:  Acute telemedicine visit for Covid19: -Onset: 4 days ago -Symptoms include: nasal congestion, cough -now starting to feel much better the last 1-2 days and feels is almost better -Denies:fevers, CP, SOB, NVD, inability to eat/drink/get out of bed -Pertinent past medical history: see below -Pertinent medication allergies:  Allergies  Allergen Reactions   Lactose Intolerance (Gi) Diarrhea   Apple Pectin [Pectin] Itching    ITCHY THROAT   Metformin And Related     D/t decreased kidney function    Peach [Prunus Persica] Itching    ITCHY THROAT   Morphine And Related Anxiety    Jittery  -COVID-19 vaccine status: had 2 doses + booster -most recent GFR < 30  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 I do not appreciate any SOB. Speech and thought processing are grossly intact. Patient reported  vitals:  Assessment and Plan:  COVID-19   Discussed all treatment options, ideal treatment window, potential complications, isolation and precautions for COVID-19.  After lengthy discussion, the patient declined referral/prescription for Covid outpatient treatment at this time. He prefers since feeling much better and he wishes to avoid medications, to stick with nasal saline and cough drops. Advised against nephrotoxic otc medications. Other symptomatic care measures summarized in patient instructions.  Advised low threshold to seek prompt in person care if worsening, new symptoms arise, or if is not improving with treatment. Advised of options for inperson care in case PCP office not available. Did let the patient know that I only do telemedicine shifts for [place] on Tuesdays and Thursdays and advised a follow up visit with PCP or at an [facility] if has further questions or concerns.   Follow Up Instructions:  I did not refer this patient for an OV with me in the next 24 hours for this/these issue(s).  I discussed the assessment and treatment plan with the patient. The patient was provided an opportunity to ask questions and all were answered. The patient agreed with the plan and demonstrated an understanding of the instructions.   I spent 11 minutes on the date of this visit in the care of this patient. See summary of tasks completed to properly care for this patient in the detailed notes above which also included counseling of above, review of PMH, medications, allergies, evaluation of the patient and ordering and/or  instructing patient on testing and care options.  [name], DO

## 2021-02-28 ENCOUNTER — Ambulatory Visit: Payer: HMO | Admitting: Podiatry

## 2021-02-28 ENCOUNTER — Encounter: Payer: Self-pay | Admitting: Family Medicine

## 2021-02-28 ENCOUNTER — Other Ambulatory Visit (INDEPENDENT_AMBULATORY_CARE_PROVIDER_SITE_OTHER): Payer: HMO | Admitting: Family Medicine

## 2021-02-28 ENCOUNTER — Telehealth: Payer: HMO | Admitting: Family Medicine

## 2021-02-28 DIAGNOSIS — U071 COVID-19: Secondary | ICD-10-CM | POA: Diagnosis not present

## 2021-02-28 DIAGNOSIS — R059 Cough, unspecified: Secondary | ICD-10-CM | POA: Diagnosis not present

## 2021-02-28 MED ORDER — BENZONATATE 100 MG PO CAPS: 100.0000 mg | ORAL_CAPSULE | Freq: Three times a day (TID) | ORAL | 0 refills | Status: DC | PRN

## 2021-02-28 NOTE — Progress Notes (Signed)
error 

## 2021-02-28 NOTE — Progress Notes (Signed)
Virtual Visit via Telephone Note  I connected with Russell Austin on 02/28/21 at  by telephone and verified that I am speaking with the correct person using two identifiers. After hours phone visit and I was not able to arrive patient for documentation   I discussed the limitations, risks, security and privacy concerns of performing an evaluation and management service by telephone and the availability of in person appointments. I also discussed with the patient that there may be a patient responsible charge related to this service. The patient expressed understanding and agreed to proceed.  Location patient: home, [place] Location provider: work or home office Participants present for the call: patient, provider, patient's wife Patient did not have a visit with me in the prior 7 days to address this/these issue(s).   History of Present Illness:  Acute telemedicine visit for Cough: -Onset: about 9 days ago -Symptoms include: had covid recently with cough and nasal congestion, he opted against treatment and now reports is doing much better but still has a cough and is requesting a cough medication -Denies:CP, SOB, NVD, malaise, fevers, inability to eat/drink/get out of bed -Pertinent past medical history:see below -Pertinent medication allergies: Allergies  Allergen Reactions   Lactose Intolerance (Gi) Diarrhea   Apple Pectin [Pectin] Itching    ITCHY THROAT   Metformin And Related     D/t decreased kidney function    Peach [Prunus Persica] Itching    ITCHY THROAT   Morphine And Related Anxiety    Jittery   -COVID-19 vaccine status:vaccinatedx2 + booster  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 I do not appreciate any SOB. Speech and thought processing are grossly intact. Patient reported vitals:  Assessment and Plan:  Cough Covid19  -we discussed possible serious and likely etiologies, options for evaluation and workup, limitations of telemedicine visit vs in person visit, treatment, treatment risks and precautions. Pt prefers to treat via telemedicine empirically rather than in person at this moment. He opted for tessalon rx for cough.  Advised to seek prompt in person care if worsening, new symptoms arise, or if is not improving with treatment. Advised of options for inperson care in case PCP office not available. Did let the patient know that I only do telemedicine shifts for [place] on Tuesdays and Thursdays and advised a follow up visit with PCP or at an [facility] if has further questions or concerns.   Follow Up Instructions:  I did not refer this patient for an OV with me in the next 24 hours for this/these issue(s).  I discussed the assessment and treatment plan with the patient. The patient was provided an opportunity to ask questions and all were answered. The patient agreed with the plan and demonstrated an understanding of the instructions.   I spent 12 minutes on the date of this visit in the care of this patient. See summary of tasks completed to properly care for this patient in the detailed notes above which also included counseling of above, review of PMH, medications, allergies, evaluation of the patient and ordering and/or  instructing patient on testing and care options.  Russell Kern, DO

## 2021-03-01 ENCOUNTER — Other Ambulatory Visit: Payer: Self-pay

## 2021-03-02 NOTE — Chronic Care Management (AMB) (Addendum)
Chronic Care Management Pharmacy Assistant   Name: Russell Austin  MRN: [id number] DOB: [birth year]  Reason for Encounter: Disease State   Conditions to be addressed/monitored: DMII   Recent office visits:  06.20.2022 [name], MD patient seen for an acute issue, blister on left feet. 05.11.2022 [name], LPN Medicare Wellness Exam 04.08.2022 [name], NP patient seen for annual exam: labs ordered. Medication discontinued cyclobenzaprine (FLEXERIL) 10 MG tablet.   Recent consult visits:  None  Hospital visits:  None in previous 6 months  Medications: Outpatient Encounter Medications as of 02/21/2021  Medication Sig   amLODipine (NORVASC) 10 MG tablet TAKE 1 TABLET BY MOUTH EVERY DAY   aspirin EC 81 MG tablet Take 81 mg by mouth daily.   atorvastatin (LIPITOR) 10 MG tablet Take 1 tablet (10 mg total) by mouth daily.   buPROPion (WELLBUTRIN SR) 150 MG 12 hr tablet TAKE 1 TABLET BY MOUTH TWICE A DAY   Continuous Blood Gluc Receiver (FREESTYLE LIBRE 14 DAY READER) DEVI Use with freestyle libre system   Continuous Blood Gluc Sensor (FREESTYLE LIBRE 14 DAY SENSOR) MISC CHANGE SENSOR EVERY 2 WEEKS   diclofenac Sodium (VOLTAREN) 1 % GEL Apply 4 g topically 4 (four) times daily.   gabapentin (NEURONTIN) 300 MG capsule TAKE 1 CAPSULE BY MOUTH EVERYDAY AT BEDTIME   glipiZIDE (GLUCOTROL XL) 2.5 MG 24 hr tablet TAKE 1 TABLET (2.5 MG TOTAL) BY MOUTH IN THE MORNING AND AT BEDTIME.   hydrochlorothiazide (HYDRODIURIL) 25 MG tablet TAKE 1 TABLET BY MOUTH DAILY WITH LOSARTAN POTASSIUM '100MG'$  (COMBINATION TAB NOT AVAILABLE)   insulin glargine (LANTUS SOLOSTAR) 100 UNIT/ML Solostar Pen INJECT 35 UNITS INTO THE SKIN DAILY. (Patient taking differently: 20 Units. INJECT 35 UNITS INTO THE SKIN DAILY.)   Insulin Pen Needle (B-D ULTRAFINE III SHORT PEN) 31G X 8 MM MISC USE WITH LANUTS PEN   losartan (COZAAR) 100 MG tablet TAKE 1 TABLET BY MOUTH DAILY WITH HCTZ (COMBINATION  TABLET NOT AVAILABLE)   metoprolol tartrate (LOPRESSOR) 50 MG tablet Take 1 tablet (50 mg total) by mouth 2 (two) times daily.   mometasone-formoterol (DULERA) 100-5 MCG/ACT AERO Inhale 2 puffs into the lungs 2 (two) times daily.   montelukast (SINGULAIR) 10 MG tablet Take 10 mg by mouth at bedtime.   Spacer/Aero-Holding Chambers (AEROCHAMBER MAX W/FLOW-VU) MISC by Does not apply route. Use as directed   tiZANidine (ZANAFLEX) 2 MG tablet Take 1 tablet (2 mg total) by mouth at bedtime.   No facility-administered encounter medications on file as of 02/21/2021.   Recent Relevant Labs: Lab Results  Component Value Date/Time   HGBA1C 7.1 (H) 12/09/2020 08:04 AM   HGBA1C 7.4 (A) 08/25/2020 08:00 AM   HGBA1C 6.8 (A) 05/26/2020 07:12 AM   HGBA1C 7.3 (A) 02/11/2020 07:09 AM   HGBA1C 8.1 (H) 11/11/2019 07:44 AM   HGBA1C 6.6 07/23/2018 09:00 AM   MICROALBUR 50.8 (H) 12/13/2014 08:01 AM   MICROALBUR 27.7 (H) 12/16/2013 09:38 AM    Kidney Function Lab Results  Component Value Date/Time   CREATININE 3.82 (H) 12/09/2020 08:04 AM   CREATININE 2.07 (H) 11/11/2019 07:44 AM   GFR 15.40 (L) 12/09/2020 08:04 AM   GFRNONAA 41 (L) 10/28/2018 09:12 AM   GFRAA 47 (L) 10/28/2018 09:12 AM    Current antihyperglycemic regimen:  Insulin glargine (LANTUS SOLOSTAR) 100 UNIT/ML Solostar Pen Glipizide (GLUCOTROL XL) 2.5 MG 24 hr tablet What recent interventions/DTPs have been made to improve glycemic control:  None Have  there been any recent hospitalizations or ED visits since last visit with CPP? No Patient denies hypoglycemic symptoms, including Pale, Sweaty, Shaky, Hungry, Nervous/irritable, and Vision changes Patient reports hyperglycemic symptoms, including none How often are you checking your blood sugar? once daily What are your blood sugars ranging?  Fasting:  06.17 86 06.18 110 06.19 100 06.20 84 06.21 120 Before meals:  After meals:  Bedtime:  During the week, how often does your blood  glucose drop below 70?  Infrequenlty Are you checking your feet daily/regularly? Yes  Adherence Review: Is the patient currently on a STATIN medication? Yes Is the patient currently on ACE/ARB medication? No Does the patient have >5 day gap between last estimated fill dates? No  I spoke with the patient about medication adherence. He stated that he has been doing well. He and his wife have just got back from a vacation in [place]. He had some issues with his feet and saw his primary care doctor. He continues to take his medications as prescribed. There have been no recent changes to his medications. He states he does not have any side effects from his current medications. he continues to monitor his blood sugars daily. There have been no hospital, emergency department, or urgent care visits since his last CPP or PCP visit. his next PCP visit is for July 2022 and his next [facility] appointment is scheduled for August 2022. He does not have any issues with his current pharmacy. [facility] [address]  Star Rating Drugs: Medication Dispensed  Quantity Pharmacy  Atorvastatin 10 mg [facility]22[facility]2022 [facility]  Glipizide 2.5 mg 03.22.2022 [facility]   Amilia Revonda Standard, [place] Pharmacist Assistant [phone]

## 2021-03-09 ENCOUNTER — Other Ambulatory Visit: Payer: Self-pay

## 2021-03-10 ENCOUNTER — Ambulatory Visit (INDEPENDENT_AMBULATORY_CARE_PROVIDER_SITE_OTHER): Payer: HMO | Admitting: Adult Health

## 2021-03-10 ENCOUNTER — Encounter: Payer: Self-pay | Admitting: Adult Health

## 2021-03-10 VITALS — BP 150/80 | HR 87 | Temp 97.0°F | Ht 67.0 in | Wt 180.0 lb

## 2021-03-10 DIAGNOSIS — I10 Essential (primary) hypertension: Secondary | ICD-10-CM | POA: Diagnosis not present

## 2021-03-10 DIAGNOSIS — R059 Cough, unspecified: Secondary | ICD-10-CM | POA: Diagnosis not present

## 2021-03-10 DIAGNOSIS — E1140 Type 2 diabetes mellitus with diabetic neuropathy, unspecified: Secondary | ICD-10-CM

## 2021-03-10 DIAGNOSIS — Z794 Long term (current) use of insulin: Secondary | ICD-10-CM | POA: Diagnosis not present

## 2021-03-10 LAB — POCT GLYCOSYLATED HEMOGLOBIN (HGB A1C): Hemoglobin A1C: 7.5 % — AB (ref 4.0–5.6)

## 2021-03-10 MED ORDER — FREESTYLE LIBRE 14 DAY SENSOR MISC: 3 refills | Status: DC

## 2021-03-10 NOTE — Progress Notes (Signed)
Subjective:    Patient ID: Russell Austin, male    DOB: November 25, 1950, 70 y.o.   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  DM -currently prescribed Lantus 22 units daily as well as glipizide ER 2.5 mg BID.  He continues to use the [place] system to monitor his blood sugars - he forgot his meter today. Reports readings mostly in the 150's or below. Has had a few in the upper 200's - 300's. He went on vacation to [place] and then got covid. Has been using a lot of OTC cough medications and drinking Ensure d/t decreased appetite.   His last A1c 3 months ago was 7.1    HTN -prescribed Norvasc 10 mg daily,losartan 100 mg/HCTZ 25 mg, metoprolol 50 mg twice daily.  He denies dizziness, lightheadedness, chest pain, shortness of breath.  He did not take his medication this morning.  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   Diabetes mellitus type II    Diabetic retinopathy FOLLOWED BY DR Zadie Rhine   ED (erectile dysfunction)    Glaucoma of both eyes    Hypertension    Left hydrocele    Stroke [facility])     Social History   Socioeconomic History   Marital status: Married    Spouse name: Not on file   Number of children: 1   Years of education: 12   Highest education level: Not on file  Occupational History   Occupation: Disabled  Tobacco Use   Smoking status: Never   Smokeless tobacco: Never  Substance and Sexual Activity   Alcohol use: No   Drug use: No   Sexual activity: Not on file  Other Topics Concern   Not on file  Social History Narrative    Lives at home with his wife and granddaughter.   Left-handed.   3 cups caffeine per day.   Social Determinants of Health   Financial Resource Strain: Low Risk    Difficulty of Paying Living Expenses: Not hard at all  Food Insecurity: No Food Insecurity   Worried About Charity fundraiser in the Last Year: Never true   [place] in the Last Year: Never true  Transportation Needs: No Transportation Needs   Lack of Transportation (Medical): No   Lack of Transportation (Non-Medical): No  Physical Activity: Inactive   Days of Exercise per Week: 0 days   Minutes of Exercise per Session: 0 min  Stress: No Stress Concern Present   Feeling of Stress : Not at all  Social Connections: Socially Integrated   Frequency of Communication with Friends and Family: More than three times a week   Frequency of Social Gatherings with Friends and Family: Twice a week   Attends Religious Services: More than 4 times per year   Active Member of [employer] or Organizations: Yes   Attends Archivist Meetings: 1 to 4 times per year   Marital Status: Married  Human resources officer Violence: Not At Risk   Fear of Current or Ex-Partner: No   Emotionally Abused: No  Physically Abused: No   Sexually Abused: No    Past Surgical History:  Procedure Laterality Date   APPENDECTOMY  AGE EARLY 20'S   COLONOSCOPY  12/30/2020   every 5 years   HYDROCELE EXCISION  03/31/2012   Procedure: HYDROCELECTOMY ADULT;  Surgeon: Franchot Gallo, MD;  Location: [facility];  Service: Urology;  Laterality: Left;  16 MINS     LEFT EYE LASER RETINA REPAIR  SEPT 2012   RIGHT EYE VITRECTOMY/ INSERTION GLAUCOMA SETON/ LASER REPAIR  12-13-2008   RETINAL ARTERY OCCLUSION /NEOVASCULAR GLAUCOMA/ HEMORRHAGE   RIGHT EYE VITRETOMY/ INSERTION GLAUCOMA SETON X2/ LASER  03-24-2009   RECURRENT HEMORRHAGE/ OCCLUSION INTERNAL SETON   SHOULDER ARTHROSCOPY Right 2005   undescended right testicle removed  1994     Family History  Problem Relation Age of Onset   Glaucoma Mother    Diabetes Mother    Stomach cancer Maternal Grandfather    Stroke Maternal Grandmother     Allergies  Allergen Reactions   Lactose Intolerance (Gi) Diarrhea   Apple Pectin [Pectin] Itching    ITCHY THROAT   Metformin And Related     D/t decreased kidney function    Peach [Prunus Persica] Itching    ITCHY THROAT   Morphine And Related Anxiety    Jittery    Current Outpatient Medications on File Prior to Visit  Medication Sig Dispense Refill   amLODipine (NORVASC) 10 MG tablet TAKE 1 TABLET BY MOUTH EVERY DAY 90 tablet 2   aspirin EC 81 MG tablet Take 81 mg by mouth daily.     atorvastatin (LIPITOR) 10 MG tablet Take 1 tablet (10 mg total) by mouth daily. 90 tablet 3   benzonatate (TESSALON PERLES) 100 MG capsule Take 1 capsule (100 mg total) by mouth 3 (three) times daily as needed. 20 capsule 0   buPROPion (WELLBUTRIN SR) 150 MG 12 hr tablet TAKE 1 TABLET BY MOUTH TWICE A DAY 180 tablet 0   Continuous Blood Gluc Receiver (FREESTYLE LIBRE 14 DAY READER) DEVI Use with freestyle libre system 1 each 0   diclofenac Sodium (VOLTAREN) 1 % GEL Apply 4 g topically 4 (four) times daily. 350 g 0   gabapentin (NEURONTIN) 300 MG capsule TAKE 1 CAPSULE BY MOUTH EVERYDAY AT BEDTIME 90 capsule 0   hydrochlorothiazide (HYDRODIURIL) 25 MG tablet TAKE 1 TABLET BY MOUTH DAILY WITH LOSARTAN POTASSIUM '100MG'$  (COMBINATION TAB NOT AVAILABLE) 90 tablet 3   insulin glargine (LANTUS SOLOSTAR) 100 UNIT/ML Solostar Pen INJECT 35 UNITS INTO THE SKIN DAILY. (Patient taking differently: 22 Units. INJECT 35 UNITS INTO THE SKIN DAILY.) 15 mL 1   Insulin Pen Needle (B-D ULTRAFINE III SHORT PEN) 31G X 8 MM MISC USE WITH LANUTS PEN 100 each 3   losartan (COZAAR) 100 MG tablet TAKE 1 TABLET BY MOUTH DAILY WITH HCTZ (COMBINATION TABLET NOT AVAILABLE) 90 tablet 3   metoprolol tartrate (LOPRESSOR) 50 MG tablet Take 1 tablet (50 mg total) by mouth 2  (two) times daily. 180 tablet 3   mometasone-formoterol (DULERA) 100-5 MCG/ACT AERO Inhale 2 puffs into the lungs 2 (two) times daily.     montelukast (SINGULAIR) 10 MG tablet Take 10 mg by mouth at bedtime.     Spacer/Aero-Holding Chambers (AEROCHAMBER MAX W/FLOW-VU) MISC by Does not apply route. Use as directed     tiZANidine (ZANAFLEX) 2 MG tablet Take 1 tablet (2 mg total) by mouth at bedtime. 90 tablet 1   glipiZIDE (GLUCOTROL XL) 2.5 MG 24  hr tablet TAKE 1 TABLET (2.5 MG TOTAL) BY MOUTH IN THE MORNING AND AT BEDTIME. 180 tablet 0   No current facility-administered medications on file prior to visit.    BP (!) 150/80   Pulse 87   Temp (!) 97 F (36.1 C) (Temporal)   Ht '5\' 7"'$  (1.702 m)   Wt 180 lb (81.6 kg)   SpO2 99%   BMI 28.19 kg/m       Objective:   Physical Exam Vitals and nursing note reviewed.  Constitutional:      Appearance: Normal appearance.  Cardiovascular:     Rate and Rhythm: Normal rate and regular rhythm.     Pulses: Normal pulses.     Heart sounds: Normal heart sounds.  Pulmonary:     Effort: Pulmonary effort is normal.     Breath sounds: Normal breath sounds. No wheezing, rhonchi or rales.  Musculoskeletal:        General: Normal range of motion.  Skin:    General: Skin is warm and dry.  Neurological:     General: No focal deficit present.     Mental Status: He is alert and oriented to person, place, and time.  Psychiatric:        Mood and Affect: Mood normal.        Behavior: Behavior normal.        Thought Content: Thought content normal.        Judgment: Judgment normal.       Assessment & Plan:  1. Type 2 diabetes mellitus with diabetic neuropathy, with long-term current use of insulin (HCC)  - POC HgB A1c- 7.5 - has increased over the last three months. We will hold off on medication adjustments today. Encouraged Glucerna instead of Ensure.   - Continuous Blood Gluc Sensor (FREESTYLE LIBRE 14 DAY SENSOR) MISC; Use with libre system   Dispense: 6 each; Refill: 3  2. Essential hypertension - Monitor BP at home  - Take medications as directed  3. Cough - Can switch to Mucinex instead of cough syrups which are often high in sugar  - Lungs clear on exam. No signs of secondary PNA  [name], NP

## 2021-03-14 ENCOUNTER — Ambulatory Visit: Payer: HMO | Admitting: Podiatry

## 2021-03-14 ENCOUNTER — Other Ambulatory Visit: Payer: Self-pay

## 2021-03-14 ENCOUNTER — Encounter: Payer: Self-pay | Admitting: Podiatry

## 2021-03-14 DIAGNOSIS — E1140 Type 2 diabetes mellitus with diabetic neuropathy, unspecified: Secondary | ICD-10-CM

## 2021-03-14 DIAGNOSIS — M79674 Pain in right toe(s): Secondary | ICD-10-CM | POA: Diagnosis not present

## 2021-03-14 DIAGNOSIS — Z794 Long term (current) use of insulin: Secondary | ICD-10-CM | POA: Diagnosis not present

## 2021-03-14 DIAGNOSIS — M2142 Flat foot [pes planus] (acquired), left foot: Secondary | ICD-10-CM

## 2021-03-14 DIAGNOSIS — T148XXA Other injury of unspecified body region, initial encounter: Secondary | ICD-10-CM | POA: Diagnosis not present

## 2021-03-14 DIAGNOSIS — M79675 Pain in left toe(s): Secondary | ICD-10-CM | POA: Diagnosis not present

## 2021-03-14 DIAGNOSIS — M2141 Flat foot [pes planus] (acquired), right foot: Secondary | ICD-10-CM

## 2021-03-14 DIAGNOSIS — B351 Tinea unguium: Secondary | ICD-10-CM | POA: Diagnosis not present

## 2021-03-14 NOTE — Progress Notes (Addendum)
  Subjective:  Patient ID: Russell Austin, male    DOB: 01/12/1951,  MRN: [id number]  Chief Complaint  Patient presents with   Diabetic Ulcer    np-left foot great toe-3rd toe has blister/side-diabetic    70 y.o. male presents with the above complaint. History confirmed with patient.  Developed bleeding blisters when he wore new shoes and he was on a recent trip to [facility].  Also has thickened elongated painful toenails.  He is type II diabetic.  His A1c is 7.5%.  Objective:  Physical Exam: warm, good capillary refill, no trophic changes or ulcerative lesions, and normal DP and PT pulses. Bilateral pes planus Left Foot: dystrophic yellowed discolored nail plates with subungual debris multiple dried small blood blisters without open ulceration plantar medial hallux and third toe Right Foot: dystrophic yellowed discolored nail plates with subungual debris   Assessment:   1. Blood blister   2. Type 2 diabetes mellitus with diabetic neuropathy, with long-term current use of insulin (HCC)   3. Pain due to onychomycosis of toenails of both feet   4. Pes planus of both feet      Plan:  Patient was evaluated and treated and all questions answered.  Patient educated on diabetes. Discussed proper diabetic foot care and discussed risks and complications of disease. Educated patient in depth on reasons to return to the office immediately should he/she discover anything concerning or new on the feet. All questions answered. Discussed proper shoes as well.   Discussed the etiology and treatment options for the condition in detail with the patient. Educated patient on the topical and oral treatment options for mycotic nails. Recommended debridement of the nails today. Sharp and mechanical debridement performed of all painful and mycotic nails today. Nails debrided in length and thickness using a nail nipper to level of comfort. Discussed treatment options including appropriate shoe gear.  Follow up as needed for painful nails.  Blood blister should heal on their own he can apply a Vaseline or lotion to them.  No open ulceration.  No need for antibiotics.  Return in about 3 months (around 06/14/2021) for diabetic nail and callus trim with Dr Elisha Ponder.

## 2021-03-20 DIAGNOSIS — N184 Chronic kidney disease, stage 4 (severe): Secondary | ICD-10-CM | POA: Diagnosis not present

## 2021-03-22 DIAGNOSIS — N184 Chronic kidney disease, stage 4 (severe): Secondary | ICD-10-CM | POA: Diagnosis not present

## 2021-03-29 DIAGNOSIS — N401 Enlarged prostate with lower urinary tract symptoms: Secondary | ICD-10-CM | POA: Diagnosis not present

## 2021-03-29 DIAGNOSIS — E785 Hyperlipidemia, unspecified: Secondary | ICD-10-CM | POA: Diagnosis not present

## 2021-03-29 DIAGNOSIS — I129 Hypertensive chronic kidney disease with stage 1 through stage 4 chronic kidney disease, or unspecified chronic kidney disease: Secondary | ICD-10-CM | POA: Diagnosis not present

## 2021-03-29 DIAGNOSIS — E11319 Type 2 diabetes mellitus with unspecified diabetic retinopathy without macular edema: Secondary | ICD-10-CM | POA: Diagnosis not present

## 2021-03-29 DIAGNOSIS — G8190 Hemiplegia, unspecified affecting unspecified side: Secondary | ICD-10-CM | POA: Diagnosis not present

## 2021-03-29 DIAGNOSIS — E1122 Type 2 diabetes mellitus with diabetic chronic kidney disease: Secondary | ICD-10-CM | POA: Diagnosis not present

## 2021-03-29 DIAGNOSIS — N184 Chronic kidney disease, stage 4 (severe): Secondary | ICD-10-CM | POA: Diagnosis not present

## 2021-04-07 DIAGNOSIS — Z4420 Encounter for fitting and adjustment of artificial eye, unspecified: Secondary | ICD-10-CM | POA: Diagnosis not present

## 2021-04-17 ENCOUNTER — Encounter: Payer: Self-pay | Admitting: Adult Health

## 2021-04-17 DIAGNOSIS — H40012 Open angle with borderline findings, low risk, left eye: Secondary | ICD-10-CM | POA: Diagnosis not present

## 2021-04-17 DIAGNOSIS — E113312 Type 2 diabetes mellitus with moderate nonproliferative diabetic retinopathy with macular edema, left eye: Secondary | ICD-10-CM | POA: Diagnosis not present

## 2021-04-17 DIAGNOSIS — H4089 Other specified glaucoma: Secondary | ICD-10-CM | POA: Diagnosis not present

## 2021-04-17 LAB — HM DIABETES EYE EXAM

## 2021-04-18 ENCOUNTER — Encounter: Payer: Self-pay | Admitting: Adult Health

## 2021-04-18 ENCOUNTER — Telehealth: Payer: Self-pay | Admitting: Pharmacist

## 2021-04-18 NOTE — Chronic Care Management (AMB) (Signed)
Chronic Care Management Pharmacy Assistant   Name: RAJAE BLASKOWSKI  MRN: [id number] DOB: 01-01-1951    04-18-2021- Patient called to remind of appointment with [name] Clinical Pharmacist on 04-19-2021 at 11 am via phone call.  Patient aware of appointment date, time, and type of appointment  Patient aware to have all medications, supplements, blood pressure and/or blood sugar logs available for call.  Care Gaps: Zoster Vaccine - Overdue Foot Exam - Overdue COVID Booster #4 Therapist, music) - Overdue Flu Vaccine - Overdue  Star Rating Drug: Losartan (Cozaar) 100 mg - [facility]20 [facility] Atorvastatin (Lipitor) 10 mg - Last filled 01-22-2021 [facility] Glipizide (Glucotrol xl) 2.5 mg - Last filled 11-22-2020 [facility]  Any gaps in medications fill history?  GLIPIZIDE ER 2.5 MG TABLET 11/22/2020 90   BUDESONIDE-FORMOTEROL 80-4.5 03/06/2021 30  LANTUS SOLOSTAR 100 UNIT/ML 07/10/2020 42   METOPROLOL TARTRATE '50MG'$  TABLET 05/14/2020 90  Notes: [facility] to [facility] dates above spoke to alex whom verified the dates above are accurate and he states no refills on any they are expired.  Medications: Outpatient Encounter Medications as of 04/18/2021  Medication Sig   amLODipine (NORVASC) 10 MG tablet TAKE 1 TABLET BY MOUTH EVERY DAY   aspirin EC 81 MG tablet Take 81 mg by mouth daily.   atorvastatin (LIPITOR) 10 MG tablet Take 1 tablet (10 mg total) by mouth daily.   benzonatate (TESSALON PERLES) 100 MG capsule Take 1 capsule (100 mg total) by mouth 3 (three) times daily as needed.   budesonide-formoterol (SYMBICORT) 80-4.5 MCG/ACT inhaler Inhale into the lungs.   buPROPion (WELLBUTRIN SR) 150 MG 12 hr tablet TAKE 1 TABLET BY MOUTH TWICE A DAY   Continuous Blood Gluc Receiver (FREESTYLE LIBRE 14 DAY READER) DEVI Use with freestyle libre system   Continuous Blood Gluc Sensor (FREESTYLE LIBRE 14 DAY SENSOR) MISC Use with libre system   diclofenac Sodium  (VOLTAREN) 1 % GEL Apply 4 g topically 4 (four) times daily.   gabapentin (NEURONTIN) 300 MG capsule TAKE 1 CAPSULE BY MOUTH EVERYDAY AT BEDTIME   glipiZIDE (GLUCOTROL XL) 2.5 MG 24 hr tablet TAKE 1 TABLET (2.5 MG TOTAL) BY MOUTH IN THE MORNING AND AT BEDTIME.   hydrochlorothiazide (HYDRODIURIL) 25 MG tablet TAKE 1 TABLET BY MOUTH DAILY WITH LOSARTAN POTASSIUM '100MG'$  (COMBINATION TAB NOT AVAILABLE)   insulin glargine (LANTUS SOLOSTAR) 100 UNIT/ML Solostar Pen INJECT 35 UNITS INTO THE SKIN DAILY. (Patient taking differently: 22 Units. INJECT 35 UNITS INTO THE SKIN DAILY.)   Insulin Pen Needle (B-D ULTRAFINE III SHORT PEN) 31G X 8 MM MISC USE WITH LANUTS PEN   losartan (COZAAR) 100 MG tablet TAKE 1 TABLET BY MOUTH DAILY WITH HCTZ (COMBINATION TABLET NOT AVAILABLE)   metoprolol tartrate (LOPRESSOR) 50 MG tablet Take 1 tablet (50 mg total) by mouth 2 (two) times daily.   mometasone-formoterol (DULERA) 100-5 MCG/ACT AERO Inhale 2 puffs into the lungs 2 (two) times daily.   montelukast (SINGULAIR) 10 MG tablet Take 10 mg by mouth at bedtime.   neomycin-polymyxin b-dexamethasone (MAXITROL) 3.5-10000-0.1 SUSP Place 1 drop into the right eye 4 (four) times daily.   polyethylene glycol-electrolytes (NULYTELY) 420 g solution peg-electrolyte solution 420 gram oral solution  TAKE 4000 MILLILITER AS DIRECTED PER PREP SHEET   Spacer/Aero-Holding Chambers (AEROCHAMBER MAX W/FLOW-VU) MISC by Does not apply route. Use as directed   tiZANidine (ZANAFLEX) 2 MG tablet Take 1 tablet (2 mg total)  by mouth at bedtime.   No facility-administered encounter medications on file as of 04/18/2021.     [place] Clinical Pharmacist Assistant [phone]

## 2021-04-19 ENCOUNTER — Ambulatory Visit (INDEPENDENT_AMBULATORY_CARE_PROVIDER_SITE_OTHER): Payer: HMO | Admitting: Pharmacist

## 2021-04-19 ENCOUNTER — Other Ambulatory Visit: Payer: Self-pay | Admitting: Adult Health

## 2021-04-19 DIAGNOSIS — Z794 Long term (current) use of insulin: Secondary | ICD-10-CM

## 2021-04-19 DIAGNOSIS — I10 Essential (primary) hypertension: Secondary | ICD-10-CM | POA: Diagnosis not present

## 2021-04-19 DIAGNOSIS — E1140 Type 2 diabetes mellitus with diabetic neuropathy, unspecified: Secondary | ICD-10-CM

## 2021-04-19 MED ORDER — LOSARTAN POTASSIUM 100 MG PO TABS: 100.0000 mg | ORAL_TABLET | Freq: Every day | ORAL | 0 refills | Status: DC

## 2021-04-19 NOTE — Progress Notes (Signed)
Chronic Care Management Pharmacy Note  04/19/2021 Name:  Russell Austin MRN:  [id number] DOB:  Nov 07, 1950  Summary: BP not at goal < 130/80 per home readings Pt is taking 50 mg of HCTZ and chlorthalidone daily Pt does not think bupropion is helping and is taking once daily  Recommendations/Changes made from today's visit: -Recommended stopping all HCTZ products and requested new rx to be sent in for losartan alone -Recommended routine BP monitoring at home  Plan: BP follow up in 2-3 weeks  Subjective: Russell Austin is an 70 y.o. year old male who is a primary patient of Russell Peng, NP.  The CCM team was consulted for assistance with disease management and care coordination needs.    Engaged with patient by telephone for follow up visit in response to provider referral for pharmacy case management and/or care coordination services.   Consent to Services:  The patient was given information about Chronic Care Management services, agreed to services, and gave verbal consent prior to initiation of services.  Please see initial visit note for detailed documentation.   Patient Care Team: Russell Peng, NP as PCP - General (Family Medicine) Russell Austin, Russell Grinder, MD as Consulting Physician (Allergy and Immunology) [facility] (Ophthalmology) Russell Cork, MD (Ophthalmology) Russell Finders Joyice Faster, MD as Consulting Physician (Neurosurgery) Russell Grapes Shaune Pollack, MD as Consulting Physician (Nephrology) [facility] (Plastic Surgery) Russell Austin, [facility] as Pharmacist (Pharmacist)  Recent office visits: 03/10/21 Russell Peng, NP: Patient presented for DM follow up. BP elevated in office. A1c increased to 7.5%.  02/28/21 Colin Benton, DO: Patient presented for televisit for cough. Prescribed benzonatate.   02/28/21 Colin Benton, DO: Patient presented for televisit for COVID 19 infection. Patient preferred to hold off on medication.    02/20/21 Burchette, Alinda Sierras, MD: Patient presented for blister on foot.  01/11/21 [name], LPN: Patient presented for AWV.   12/09/20 Russell Peng, NP: Patient presented for annaul exam. Discontinued cyclobenzaprine (FLEXERIL) 10 MG tablet.  Recent consult visits: 03/17/21 Lanae Crumbly, DPM (podiatry): Patient presented for diabetic ulcer.  Hospital visits: None in previous 6 months   Objective:  Lab Results  Component Value Date   CREATININE 3.82 (H) 12/09/2020   BUN 53 (H) 12/09/2020   GFR 15.40 (L) 12/09/2020   GFRNONAA 41 (L) 10/28/2018   GFRAA 47 (L) 10/28/2018   NA 136 12/09/2020   K 4.2 12/09/2020   CALCIUM 8.6 12/09/2020   CO2 26 12/09/2020   GLUCOSE 95 12/09/2020    Lab Results  Component Value Date/Time   HGBA1C 7.5 (A) 03/10/2021 07:17 AM   HGBA1C 7.1 (H) 12/09/2020 08:04 AM   HGBA1C 7.4 (A) 08/25/2020 08:00 AM   HGBA1C 7.3 (A) 02/11/2020 07:09 AM   HGBA1C 8.1 (H) 11/11/2019 07:44 AM   HGBA1C 6.6 07/23/2018 09:00 AM   GFR 15.40 (L) 12/09/2020 08:04 AM   GFR 38.81 (L) 11/11/2019 07:44 AM   MICROALBUR 50.8 (H) 12/13/2014 08:01 AM   MICROALBUR 27.7 (H) 12/16/2013 09:38 AM    Last diabetic Eye exam:  Lab Results  Component Value Date/Time   HMDIABEYEEXA Retinopathy (A) 04/17/2021 12:00 AM    Last diabetic Foot exam:  Lab Results  Component Value Date/Time   HMDIABFOOTEX done 02/02/2010 12:00 AM     Lab Results  Component Value Date   CHOL 98 12/09/2020   HDL 30.20 (L) 12/09/2020   LDLCALC 48 12/09/2020   TRIG 101.0 12/09/2020   CHOLHDL 3 12/09/2020  Hepatic Function Latest Ref Rng & Units 12/09/2020 11/11/2019 10/15/2018  Total Protein 6.0 - 8.3 g/dL 6.3 5.8(L) 6.6  Albumin 3.5 - 5.2 g/dL 3.7 3.4(L) 3.7  AST 0 - 37 U/L 17 16 14   ALT 0 - 53 U/L 16 17 12   Alk Phosphatase 39 - 117 U/L 135(H) 110 100  Total Bilirubin 0.2 - 1.2 mg/dL 1.0 1.0 1.0  Bilirubin, Direct 0.0 - 0.3 mg/dL - - -    Lab Results  Component Value Date/Time    TSH 2.03 12/09/2020 08:04 AM   TSH 3.47 11/11/2019 07:44 AM    CBC Latest Ref Rng & Units 12/09/2020 11/11/2019 10/28/2018  WBC 4.0 - 10.5 K/uL 3.7(L) 4.2 6.7  Hemoglobin 13.0 - 17.0 g/dL 12.2(L) 11.7(L) 10.0(L)  Hematocrit 39.0 - 52.0 % 37.0(L) 34.9(L) 32.0(L)  Platelets 150.0 - 400.0 K/uL 258.0 270.0 318    No results found for: VD25OH  Clinical ASCVD: Yes  The ASCVD Risk score Russell Austin DC Jr., et al., 2013) failed to calculate for the following reasons:   The valid total cholesterol range is 130 to 320 mg/dL    Depression screen [facility] 2/9 01/11/2021 12/09/2020 12/30/2019  Decreased Interest 0 0 0  Down, Depressed, Hopeless 0 0 0  PHQ - 2 Score 0 0 0  Altered sleeping - 0 -  Tired, decreased energy - 0 -  Change in appetite - 0 -  Feeling bad or failure about yourself  - 0 -  Trouble concentrating - 1 -  Moving slowly or fidgety/restless - 0 -  Suicidal thoughts - 0 -  PHQ-9 Score - 1 -  Difficult doing work/chores - Not difficult at all -  Some recent data might be hidden      Social History   Tobacco Use  Smoking Status Never  Smokeless Tobacco Never   BP Readings from Last 3 Encounters:  03/10/21 (!) 150/80  02/20/21 (!) 150/70  12/09/20 120/70   Pulse Readings from Last 3 Encounters:  03/10/21 87  02/20/21 93  12/09/20 (!) 58   Wt Readings from Last 3 Encounters:  03/10/21 180 lb (81.6 kg)  02/20/21 184 lb 3.2 oz (83.6 kg)  12/09/20 180 lb 9.6 oz (81.9 kg)   BMI Readings from Last 3 Encounters:  03/10/21 28.19 kg/m  02/20/21 28.85 kg/m  12/09/20 28.29 kg/m    Assessment/Interventions: Review of patient past medical history, allergies, medications, health status, including review of consultants reports, laboratory and other test data, was performed as part of comprehensive evaluation and provision of chronic care management services.   SDOH:  (Social Determinants of Health) assessments and interventions performed: No  SDOH Screenings   Alcohol Screen: Not  on file  Depression (PHQ2-9): Low Risk    PHQ-2 Score: 0  Financial Resource Strain: Low Risk    Difficulty of Paying Living Expenses: Not hard at all  Food Insecurity: No Food Insecurity   Worried About Charity fundraiser in the Last Year: Never true   Ran Out of Food in the Last Year: Never true  Housing: Low Risk    Last Housing Risk Score: 0  Physical Activity: Inactive   Days of Exercise per Week: 0 days   Minutes of Exercise per Session: 0 min  Social Connections: Engineer, building services of Communication with Friends and Family: More than three times a week   Frequency of Social Gatherings with Friends and Family: Twice a week   Attends Religious Services: More than 4  times per year   Active Member of Clubs or Organizations: Yes   Attends Archivist Meetings: 1 to 4 times per year   Marital Status: Married  Stress: No Stress Concern Present   Feeling of Stress : Not at all  Tobacco Use: Low Risk    Smoking Tobacco Use: Never   Smokeless Tobacco Use: Never  Transportation Needs: No Transportation Needs   Lack of Transportation (Medical): No   Lack of Transportation (Non-Medical): No    CCM Care Plan  Allergies  Allergen Reactions   Lactose Intolerance (Gi) Diarrhea   Apple Pectin [Pectin] Itching    ITCHY THROAT   Metformin And Related     D/t decreased kidney function    Peach [Prunus Persica] Itching    ITCHY THROAT   Morphine And Related Anxiety    Jittery    Medications Reviewed Today     Reviewed by Criselda Peaches, DPM (Physician) on 03/14/21 at 2112  Med List Status: <None>   Medication Order Taking? Sig Documenting Provider Last Dose Status Informant  amLODipine (NORVASC) 10 MG tablet [id number]  TAKE 1 TABLET BY MOUTH EVERY DAY Nafziger, Cory, NP  Active   aspirin EC 81 MG tablet [id number]  Take 81 mg by mouth daily. [provider]  Active   atorvastatin (LIPITOR) 10 MG tablet [id number]  Take 1 tablet (10 mg total) by  mouth daily. Nafziger, [name], NP  Active   benzonatate (TESSALON PERLES) 100 MG capsule [id number]  Take 1 capsule (100 mg total) by mouth 3 (three) times daily as needed. [name], DO  Active   budesonide-formoterol [employer]) 80-4.5 MCG/ACT inhaler [id number]  Inhale into the lungs. [provider]  Active   buPROPion [facility] SR) 150 MG 12 hr tablet [id number]  TAKE 1 TABLET BY MOUTH TWICE A DAY Nafziger, [name], NP  Active   Continuous Blood Gluc Receiver (FREESTYLE LIBRE 14 DAY READER) [place] [id number]  Use with freestyle libre system Nafziger, [place], NP  Active   Continuous Blood Gluc Sensor (FREESTYLE LIBRE 14 DAY SENSOR) [place] [id number]  Use with libre system Nafziger, [place], NP  Active   diclofenac Sodium (VOLTAREN) 1 % GEL [id number]  Apply 4 g topically 4 (four) times daily. [place], Betty G, MD  Active   gabapentin (NEURONTIN) 300 MG capsule [id number]  TAKE 1 CAPSULE BY MOUTH EVERYDAY AT BEDTIME Nafziger, [name], NP  Active   glipiZIDE (GLUCOTROL XL) 2.5 MG 24 hr tablet [id number]  TAKE 1 TABLET (2.5 MG TOTAL) BY MOUTH IN THE MORNING AND AT BEDTIME. Russell Peng, NP  Expired 02/20/21 2359   hydrochlorothiazide (HYDRODIURIL) 25 MG tablet [id number]  TAKE 1 TABLET BY MOUTH DAILY WITH LOSARTAN POTASSIUM 100MG (COMBINATION TAB NOT AVAILABLE) Nafziger, [name], NP  Active   insulin glargine (LANTUS SOLOSTAR) 100 UNIT/ML Solostar Pen [id number]  INJECT 35 UNITS INTO THE SKIN DAILY.  Patient taking differently: 22 Units. INJECT 35 UNITS INTO THE SKIN DAILY.   Nafziger, [name], NP  Active   Insulin Pen Needle (B-D ULTRAFINE III SHORT PEN) 31G X 8 MM MISC [id number]  USE WITH LANUTS PEN Nafziger, [name], NP  Active   losartan (COZAAR) 100 MG tablet [id number]  TAKE 1 TABLET BY MOUTH DAILY WITH HCTZ (COMBINATION TABLET NOT AVAILABLE) Nafziger, [name], NP  Active   metoprolol tartrate (LOPRESSOR) 50 MG tablet [id number]  Take 1 tablet (50 mg total) by mouth 2 (two) times daily. Nafziger, [name], NP  Active    mometasone-formoterol (DULERA) 100-5 MCG/ACT  AERO [id number]  Inhale 2 puffs into the lungs 2 (two) times daily. Russell Austin, Russell Grinder, MD  Active   montelukast (SINGULAIR) 10 MG tablet [id number]  Take 10 mg by mouth at bedtime. Russell Austin, Russell Grinder, MD  Active   neomycin-polymyxin b-dexamethasone (MAXITROL) 3.5-10000-0.1 SUSP [id number]  Place 1 drop into the right eye 4 (four) times daily. [provider]  Active   polyethylene glycol-electrolytes (NULYTELY) 420 g solution [id number]  peg-electrolyte solution 420 gram oral solution  TAKE 4000 MILLILITER AS DIRECTED PER PREP SHEET [provider]  Active   Spacer/Aero-Holding Chambers (AEROCHAMBER MAX W/FLOW-VU) Jal [id number]  by Does not apply route. Use as directed Russell Austin, Russell Grinder, MD  Active   tiZANidine (ZANAFLEX) 2 MG tablet [id number]  Take 1 tablet (2 mg total) by mouth at bedtime. 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)    CKD stage 2 due to type 2 diabetes mellitus ([place]) 02/12/2015   Right hemiparesis ([place])    Type 2 diabetes mellitus with diabetic neuropathy ([place]) 02/10/2015   Neck pain    Numbness and tingling 02/09/2015   Left shoulder pain 07/17/2012   Plantar fasciitis, bilateral 07/17/2012   Peripheral neuropathy 11/06/2007   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   Essential hypertension 07/08/2007   EXTRINSIC ASTHMA, UNSPECIFIED 06/25/2007    Immunization History  Administered Date(s) Administered   Fluad Quad(high Dose 65+) 05/14/2019, 05/26/2020   Influenza Split 07/02/2011, 07/17/2012   Influenza, High Dose Seasonal PF 05/29/2017    Influenza,inj,Quad PF,6+ Mos 05/13/2013, 06/15/2015, 06/19/2016, 06/30/2018   Influenza-Unspecified 06/30/2018   PFIZER(Purple Top)SARS-COV-2 Vaccination 09/25/2019, 10/16/2019, 06/07/2020   Pneumococcal Conjugate-13 10/10/2017   Pneumococcal Polysaccharide-23 12/20/2014, 11/11/2019   Td 02/02/2010    Conditions to be addressed/monitored:  Hypertension, Hyperlipidemia, Diabetes, Chronic Kidney Disease, and Irritability and Pain  Conditions addressed this visit: Hypertension, Irritability/depression, CKD  Care Plan : [place]  Updates made by Russell Austin, [place] since 04/19/2021 12:00 AM     Problem: Problem: Hypertension, Hyperlipidemia, Diabetes, Chronic Kidney Disease, and Irritability and Pain      Long-Range Goal: Patient-Specific Goal   Start Date: 04/19/2021  Expected End Date: 04/19/2022  This Visit's Progress: On track  Priority: High  Note:   Current Barriers:  Unable to independently monitor therapeutic efficacy Unable to achieve control of blood pressure  Unable to self administer medications as prescribed  Pharmacist Clinical Goal(s):  Patient will achieve adherence to monitoring guidelines and medication adherence to achieve therapeutic efficacy achieve control of blood pressure as evidenced by home readings  through collaboration with PharmD and provider.   Interventions: 1:1 collaboration with Russell Peng, NP regarding development and update of comprehensive plan of care as evidenced by provider attestation and co-signature Inter-disciplinary care team collaboration (see longitudinal plan of care) Comprehensive medication review performed; medication list updated in electronic medical record  Hypertension (BP goal <130/80) -Not ideally controlled -Current treatment: Amlodipine 60m, 1 tablet once daily  Hydrochlorothiazide 226m 1 tablet once daily Losartan -HCTZ 100 mg-25 mg 1 tablet once daily  Metoprolol tartrate 5016m1 tablet twice  daily  Chlorthalidone 25 mg 1/2 tablet daily -Medications previously tried: unknown -Current home readings: 143/67; around 140s (not checking once a week) -Current dietary habits: tries to limit salt some; does enjoy eating; [place] sea salt  - doesn't use canned and frozen vegetables -Current exercise habits: did not discuss -Denies hypotensive/hypertensive symptoms -Educated on Daily salt intake goal < 2300 mg; Exercise goal of 150 minutes per week; Importance of home blood pressure monitoring; Proper BP monitoring technique; -Counseled to monitor BP at home at least once weekly, document, and provide log at future appointments -Counseled on diet and exercise extensively Counseled on stopping HCTZ products and only taking chlorthalidone. Requested a new prescription for losartan 100 mg tablets only.  Hyperlipidemia: (LDL goal < 70) -Controlled -Current treatment: Atorvastatin 75m, 1 tablet once daily  -Medications previously tried: none  -Current dietary patterns: did not discuss -Current exercise habits: did not discuss -Educated on Cholesterol goals;  Exercise goal of 150 minutes per week; -Counseled on diet and exercise extensively Recommended to continue current medication  Diabetes (A1c goal <7%) -Uncontrolled -Current medications: Insulin glargine (Lantus Solostar), inject 20 units into skin daily Glipizide ER 2.569m, 1 tablet in the morning and at bedtime (takes first thing) -Medications previously tried: metformin (kidney function), Jardiance (kidney function) -Current home glucose readings fasting glucose: 60 (in the morning one time); 90-120 (sometimes 200s)  post prandial glucose: did not discuss -Reports hypoglycemic/hyperglycemic symptoms -Current meal patterns:  breakfast: did not discuss  lunch: did not discuss   dinner: did not discuss  snacks: did not discuss  drinks: did not discuss  -Current exercise: did not discuss -Educated on A1c and blood sugar  goals; Exercise goal of 150 minutes per week; -Counseled to check feet daily and get yearly eye exams -Counseled on diet and exercise extensively Recommended to continue current medication Plan to connect to [facility] possible to assess diabetes control.   Depression/Anxiety/Irritability (Goal: minimize symptoms) -Not ideally controlled -Current treatment: Bupropion XL 150 mg twice daily - taking once daily and not even consistently -Medications previously tried/failed: none -PHQ9: 0 -Educated on Benefits of medication for symptom control -Recommended trial of SSRI for better management of irritability. Patient does not feel depressed.  Right hemiparesis  (Goal: minimize symptoms) -Controlled -Current treatment  Gabapentin 30066m1 capsule at bedtime  Tizanidine 2mg70m tablet at bedtime  -Medications previously tried: cyclobenzaprine  -Recommended to continue current medication  History of stroke (Goal: prevent future strokes) -Controlled -Current treatment  Atorvastatin 10mg26mtablet once daily  Aspirin 81mg,72mablet once daily -Medications previously tried: none  -Recommended to continue current medication   Health Maintenance -Vaccine gaps: shingrix, COVID booster, influenza -Current therapy:  none -Educated on Cost vs benefit of each product must be carefully weighed by individual consumer -Patient is satisfied with current therapy and denies issues -Recommended to continue as is  Patient Goals/Self-Care Activities Patient will:  - take medications as prescribed check glucose with Freestyle libre, document, and provide at future appointments check blood pressure weekly, document, and provide at future appointments target a minimum of 150 minutes of moderate intensity exercise weekly  Follow Up Plan: Telephone follow up appointment with care management team member scheduled for: 3 months         Medication Assistance: None required.  Patient affirms  current coverage meets needs.  Compliance/Adherence/Medication fill history: Care Gaps: Shingrix, foot exam, COVID booster, influenza vaccine  Star-Rating Drugs: Losartan (Cozaar) 100 mg - [facility]-20 [facility]  Atorvastatin (Lipitor) 10 mg - Last filled [facility]-22-2021 [facility] Glipizide (Glucotrol xl) 2.5 mg - Last filled 11-22-2020 [facility]  [facility]  [id number] [address] [address]Phone: [phone]: [phone] Uses pill box? Yes Pt endorses 95% compliance  We discussed: Benefits of medication synchronization, packaging and delivery as well as enhanced pharmacist oversight with [facility]. Patient decided to: Utilize [facility] for medication synchronization, packaging and delivery  Care Plan and Follow Up Patient Decision:  Patient agrees to Care Plan and Follow-up.  Plan: Telephone follow up appointment with care management team member scheduled for:  3 months  MJeni Salles PharmD, BMission CanyonPharmacist [phone]

## 2021-04-20 ENCOUNTER — Telehealth: Payer: Self-pay | Admitting: Pharmacist

## 2021-04-20 ENCOUNTER — Other Ambulatory Visit: Payer: Self-pay | Admitting: Adult Health

## 2021-04-20 MED ORDER — BUPROPION HCL ER (SR) 150 MG PO TB12: 150.0000 mg | ORAL_TABLET | Freq: Every day | ORAL | 0 refills | Status: DC

## 2021-04-20 NOTE — Addendum Note (Signed)
Addended by: Viona Gilmore on: 04/20/2021 12:39 PM   Modules accepted: Orders

## 2021-04-20 NOTE — Progress Notes (Addendum)
     Chronic Care Management    Outreach Note   04-20-2021   Name: Russell Austin  MRN: [id number]       DOB:[birth year]   Referred by: Russell Peng, NP  Reason for referral: Chronic care management  Reviewed chart for medication changes ahead of medication coordination call.  BP Readings from Last 3 Encounters:  03/10/21 (!) 150/80  02/20/21 (!) 150/70  12/09/20 120/70    Lab Results  Component Value Date   HGBA1C 7.5 (A) 03/10/2021     Verbal consent obtained for [facility] enhanced pharmacy services (medication synchronization, adherence packaging, delivery coordination). A medication sync plan was created to allow patient to get all medications delivered once every 30 to 90 days per patient preference. Patient understands they have freedom to choose pharmacy and clinical pharmacist will coordinate care between all prescribers and [facility].  Patient requested to obtain medications through Adherence Packaging  30 Days   Med sync plan:  Medication Name CPP Transfer (put Dr.'s name) Timing Last Fill Date & Day Supply Format: MM/DD/YY - DS (If last fill/DS unavailable, list pt.'s quantity on hand) Anticipated next due date  Format: MM/DD/YY      BB  B  L  EM  BT    Losartan 100 mg X   1    04/20/21 05/19/2021  Chlorthalidone 25 mg    Russell Chew MD  .5    #40 Tabs on hand as of 04/19/2021 05/29/2021   Gabapentin 300 mg X      1 #35 Tabs on hand as of 04/19/2021 05/22/2021  Bupropion SR 150 mg X   1    #11 Tabs on hand as of 04/19/2021 04/28/2021  Glipizide 2.5 mg X   1  1  #60 Tabs on hand as of 04/19/2021 06/16/2021  Amlodipine 10 mg X   1    #45 Tabs on hand as of 04/19/2021 9/302022  Tizanidine 2 mg X      1 #30 Tabs on hand as of 04/19/2021 05/19/2021  Aspirin 81 mg    1    #150 Tabs on hand as of 04/19/2021 09/14/2021  Atorvastatin 10 mg X   1    #60 Tabs on hand as of 04/19/2021 06/16/2021  Metoprolol 50 mg X   1  1  #100 Tabs on hand as of  04/19/2021 07/08/2021  Mometasone-Formoterol (Dulera) 100-5 MCG/ACT  Russell Southward MD P R N    Cannot fill until provider is seen  Insulin Glargine 100 Units/ML X        Will call when needed    Prescriptions requested from PCP   Call to Dr Russell Austin @ [phone] to request prescription be sent to [facility] for Chlorthalidone 25 mg Left Message with Russell Austin CMA  Call to Dr Russell Austin to request prescription be sent to [facility] for [facility] spoke to Russell Austin who stated he had not been seen in over a year they are unable to send over script until he makes an appointment. [place] Clinical Pharmacist Assistant [phone]

## 2021-04-21 ENCOUNTER — Other Ambulatory Visit: Payer: Self-pay

## 2021-04-21 ENCOUNTER — Other Ambulatory Visit: Payer: HMO

## 2021-04-24 ENCOUNTER — Other Ambulatory Visit: Payer: Self-pay

## 2021-04-24 DIAGNOSIS — I10 Essential (primary) hypertension: Secondary | ICD-10-CM

## 2021-04-24 DIAGNOSIS — Z794 Long term (current) use of insulin: Secondary | ICD-10-CM

## 2021-04-24 DIAGNOSIS — G8191 Hemiplegia, unspecified affecting right dominant side: Secondary | ICD-10-CM

## 2021-04-24 MED ORDER — BUPROPION HCL ER (SR) 150 MG PO TB12: 150.0000 mg | ORAL_TABLET | Freq: Every day | ORAL | 0 refills | Status: DC

## 2021-04-24 MED ORDER — TIZANIDINE HCL 2 MG PO TABS
2.0000 mg | ORAL_TABLET | Freq: Every day | ORAL | 1 refills | Status: DC
Start: 2021-04-24 — End: 2021-10-03

## 2021-04-24 MED ORDER — METOPROLOL TARTRATE 50 MG PO TABS: 50.0000 mg | ORAL_TABLET | Freq: Two times a day (BID) | ORAL | 3 refills | Status: DC

## 2021-04-24 MED ORDER — AMLODIPINE BESYLATE 10 MG PO TABS: 10.0000 mg | ORAL_TABLET | Freq: Every day | ORAL | 2 refills | Status: DC

## 2021-04-24 MED ORDER — GLIPIZIDE ER 2.5 MG PO TB24: 2.5000 mg | ORAL_TABLET | Freq: Two times a day (BID) | ORAL | 0 refills | Status: DC

## 2021-04-24 MED ORDER — FREESTYLE LIBRE 14 DAY SENSOR MISC: 3 refills | Status: DC

## 2021-04-24 MED ORDER — ATORVASTATIN CALCIUM 10 MG PO TABS: 10.0000 mg | ORAL_TABLET | Freq: Every day | ORAL | 3 refills | Status: DC

## 2021-04-24 MED ORDER — GABAPENTIN 300 MG PO CAPS: ORAL_CAPSULE | ORAL | 0 refills | Status: DC

## 2021-04-24 MED ORDER — BD PEN NEEDLE SHORT U/F 31G X 8 MM MISC: 3 refills | Status: DC

## 2021-04-24 MED ORDER — FREESTYLE LIBRE 14 DAY READER DEVI: 0 refills | Status: DC

## 2021-04-24 MED ORDER — LANTUS SOLOSTAR 100 UNIT/ML ~~LOC~~ SOPN: PEN_INJECTOR | SUBCUTANEOUS | 1 refills | Status: DC

## 2021-04-24 MED ORDER — LOSARTAN POTASSIUM 100 MG PO TABS: 100.0000 mg | ORAL_TABLET | Freq: Every day | ORAL | 0 refills | Status: DC

## 2021-05-11 ENCOUNTER — Telehealth: Payer: Self-pay

## 2021-05-11 NOTE — Telephone Encounter (Signed)
Noted  

## 2021-05-11 NOTE — Telephone Encounter (Signed)
Received PPW from [facility] for pt to get fitted for Dm foot wear. Pt has been scheduled with Dr.Burchette Sept.13 th at 11:15 am for 30 min.

## 2021-05-15 ENCOUNTER — Other Ambulatory Visit: Payer: Self-pay

## 2021-05-16 ENCOUNTER — Ambulatory Visit: Payer: HMO | Admitting: Family Medicine

## 2021-05-16 ENCOUNTER — Ambulatory Visit (INDEPENDENT_AMBULATORY_CARE_PROVIDER_SITE_OTHER): Payer: HMO | Admitting: Family Medicine

## 2021-05-16 VITALS — BP 180/90 | HR 82 | Temp 98.0°F | Wt 182.3 lb

## 2021-05-16 DIAGNOSIS — L84 Corns and callosities: Secondary | ICD-10-CM | POA: Diagnosis not present

## 2021-05-16 DIAGNOSIS — Z794 Long term (current) use of insulin: Secondary | ICD-10-CM

## 2021-05-16 DIAGNOSIS — E1140 Type 2 diabetes mellitus with diabetic neuropathy, unspecified: Secondary | ICD-10-CM | POA: Diagnosis not present

## 2021-05-16 NOTE — Progress Notes (Signed)
Established Patient Office Visit  Subjective:  Patient ID: Russell Austin, male    DOB: 1951-05-25  Age: 70 y.o. MRN: [id number]  CC:  Chief Complaint  Patient presents with   forms    Complete form =s for DM shoe    HPI REDDICK IREY presents requiring form completion for diabetic shoes.  He is followed by his primary provider regularly for diabetes and also sees podiatrist.  History of diabetic peripheral neuropathy as well as callus formation of both heels.  Patient states he has had diabetes for over 30 years.  He has history of glaucoma and also diabetic retinopathy.  He is currently treated with Lantus 35 units daily and also takes glipizide 2.5 mg twice daily.  No recent hypoglycemic symptoms.  He is followed by ophthalmology regularly.  He sees podiatry regularly.  Last A1c was 7.5% and this was drawn on 03-10-2021.  A1c 3 months prior to that was 7.1%.  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   Diabetes mellitus type II    Diabetic retinopathy FOLLOWED BY DR Zadie Rhine   ED (erectile dysfunction)    Glaucoma of both eyes    Hypertension    Left hydrocele    Stroke [facility])     Past Surgical History:  Procedure Laterality Date   APPENDECTOMY  AGE EARLY 20'S   COLONOSCOPY  12/30/2020   every 5 years   HYDROCELE EXCISION  03/31/2012   Procedure: HYDROCELECTOMY ADULT;  Surgeon: Franchot Gallo, MD;  Location: [facility];  Service: Urology;  Laterality: Left;  5 MINS     LEFT EYE LASER RETINA REPAIR  SEPT 2012   RIGHT EYE VITRECTOMY/ INSERTION GLAUCOMA SETON/ LASER REPAIR  12-13-2008   RETINAL ARTERY OCCLUSION /NEOVASCULAR GLAUCOMA/ HEMORRHAGE   RIGHT EYE VITRETOMY/ INSERTION GLAUCOMA SETON X2/ LASER  03-24-2009   RECURRENT HEMORRHAGE/ OCCLUSION INTERNAL SETON   SHOULDER ARTHROSCOPY Right 2005   undescended right testicle removed  1994    Family History  Problem Relation Age of Onset    Glaucoma Mother    Diabetes Mother    Stomach cancer Maternal Grandfather    Stroke Maternal Grandmother     Social History   Socioeconomic History   Marital status: Married    Spouse name: Not on file   Number of children: 1   Years of education: 12   Highest education level: Not on file  Occupational History   Occupation: Disabled  Tobacco Use   Smoking status: Never   Smokeless tobacco: Never  Substance and Sexual Activity   Alcohol use: No   Drug use: No   Sexual activity: Not on file  Other Topics Concern   Not on file  Social History Narrative   Lives at home with his wife and granddaughter.   Left-handed.   3 cups caffeine per day.   Social Determinants of Health   Financial Resource Strain: Low Risk    Difficulty of Paying Living Expenses: Not hard at all  Food Insecurity: No Food Insecurity   Worried About Charity fundraiser in the Last Year: Never true   [place] in the Last Year: Never true  Transportation Needs: No Transportation Needs   Lack of Transportation (Medical): No   Lack of Transportation (Non-Medical): No  Physical Activity: Inactive   Days of Exercise per Week: 0 days   Minutes of Exercise per Session: 0 min  Stress:  No Stress Concern Present   Feeling of Stress : Not at all  Social Connections: Socially Integrated   Frequency of Communication with Friends and Family: More than three times a week   Frequency of Social Gatherings with Friends and Family: Twice a week   Attends Religious Services: More than 4 times per year   Active Member of [employer] or Organizations: Yes   Attends Archivist Meetings: 1 to 4 times per year   Marital Status: Married  Human resources officer Violence: Not At Risk   Fear of Current or Ex-Partner: No   Emotionally Abused: No   Physically Abused: No   Sexually Abused: No    Outpatient Medications Prior to Visit  Medication Sig Dispense Refill   amLODipine (NORVASC) 10 MG tablet Take 1 tablet (10  mg total) by mouth daily. 90 tablet 2   aspirin EC 81 MG tablet Take 81 mg by mouth daily.     atorvastatin (LIPITOR) 10 MG tablet Take 1 tablet (10 mg total) by mouth daily. 90 tablet 3   buPROPion (WELLBUTRIN SR) 150 MG 12 hr tablet Take 1 tablet (150 mg total) by mouth daily. 90 tablet 0   chlorthalidone (HYGROTON) 25 MG tablet Take 12.5 mg by mouth daily.     Continuous Blood Gluc Receiver (FREESTYLE LIBRE 14 DAY READER) DEVI Use with freestyle libre system 1 each 0   Continuous Blood Gluc Sensor (FREESTYLE LIBRE 14 DAY SENSOR) MISC Use with libre system 6 each 3   diclofenac Sodium (VOLTAREN) 1 % GEL Apply 4 g topically 4 (four) times daily. 350 g 0   gabapentin (NEURONTIN) 300 MG capsule TAKE 1 CAPSULE BY MOUTH EVERYDAY AT BEDTIME 90 capsule 0   glipiZIDE (GLUCOTROL XL) 2.5 MG 24 hr tablet Take 1 tablet (2.5 mg total) by mouth in the morning and at bedtime. 180 tablet 0   insulin glargine (LANTUS SOLOSTAR) 100 UNIT/ML Solostar Pen INJECT 35 UNITS INTO THE SKIN DAILY. 15 mL 1   Insulin Pen Needle (B-D ULTRAFINE III SHORT PEN) 31G X 8 MM MISC USE WITH LANUTS PEN 100 each 3   losartan (COZAAR) 100 MG tablet Take 1 tablet (100 mg total) by mouth daily. 30 tablet 0   metoprolol tartrate (LOPRESSOR) 50 MG tablet Take 1 tablet (50 mg total) by mouth 2 (two) times daily. 180 tablet 3   mometasone-formoterol (DULERA) 100-5 MCG/ACT AERO Inhale 2 puffs into the lungs 2 (two) times daily.     Spacer/Aero-Holding Chambers (AEROCHAMBER MAX W/FLOW-VU) MISC by Does not apply route. Use as directed     tiZANidine (ZANAFLEX) 2 MG tablet Take 1 tablet (2 mg total) by mouth at bedtime. 90 tablet 1   No facility-administered medications prior to visit.    Allergies  Allergen Reactions   Lactose Intolerance (Gi) Diarrhea   Apple Pectin [Pectin] Itching    ITCHY THROAT   Metformin And Related     D/t decreased kidney function    Peach [Prunus Persica] Itching    ITCHY THROAT   Morphine And Related  Anxiety    Jittery    ROS Review of Systems  Constitutional:  Negative for chills and fever.  Respiratory:  Negative for shortness of breath.   Cardiovascular:  Negative for chest pain.     Objective:    Physical Exam Vitals reviewed.  Constitutional:      Appearance: Normal appearance.  Cardiovascular:     Rate and Rhythm: Normal rate.  Pulmonary:  Effort: Pulmonary effort is normal.     Breath sounds: Normal breath sounds.  Skin:    Comments: Feet are warm to touch.  Good capillary refill throughout.  He does have thickened callus left heel greater than right.  No ulceration.  He does have some mild impairment with monofilament testing in both feet but not total impairment.  Neurological:     Mental Status: He is alert.    BP (!) 180/90 (BP Location: Left Arm, Patient Position: Sitting, Cuff Size: Normal)   Pulse 82   Temp 98 F (36.7 C) (Oral)   Wt 182 lb 4.8 oz (82.7 kg)   SpO2 99%   BMI 28.55 kg/m  Wt Readings from Last 3 Encounters:  05/16/21 182 lb 4.8 oz (82.7 kg)  03/10/21 180 lb (81.6 kg)  02/20/21 184 lb 3.2 oz (83.6 kg)     Health Maintenance Due  Topic Date Due   Zoster Vaccines- Shingrix (1 of 2) Never done   FOOT EXAM  10/16/2019   COVID-19 Vaccine (4 - Booster for Pfizer series) 10/08/2020   INFLUENZA VACCINE  04/03/2021    There are no preventive care reminders to display for this patient.  Lab Results  Component Value Date   TSH 2.03 12/09/2020   Lab Results  Component Value Date   WBC 3.7 (L) 12/09/2020   HGB 12.2 (L) 12/09/2020   HCT 37.0 (L) 12/09/2020   MCV 89.5 12/09/2020   PLT 258.0 12/09/2020   Lab Results  Component Value Date   NA 136 12/09/2020   K 4.2 12/09/2020   CO2 26 12/09/2020   GLUCOSE 95 12/09/2020   BUN 53 (H) 12/09/2020   CREATININE 3.82 (H) 12/09/2020   BILITOT 1.0 12/09/2020   ALKPHOS 135 (H) 12/09/2020   AST 17 12/09/2020   ALT 16 12/09/2020   PROT 6.3 12/09/2020   ALBUMIN 3.7 12/09/2020    CALCIUM 8.6 12/09/2020   ANIONGAP 7 10/28/2018   GFR 15.40 (L) 12/09/2020   Lab Results  Component Value Date   CHOL 98 12/09/2020   Lab Results  Component Value Date   HDL 30.20 (L) 12/09/2020   Lab Results  Component Value Date   LDLCALC 48 12/09/2020   Lab Results  Component Value Date   TRIG 101.0 12/09/2020   Lab Results  Component Value Date   CHOLHDL 3 12/09/2020   Lab Results  Component Value Date   HGBA1C 7.5 (A) 03/10/2021      Assessment & Plan:   Type 2 diabetes with history of diabetic retinopathy and peripheral neuropathy with also complication of recurrent callus on both feet.  Patient is followed regularly by primary care and under comprehensive treatment plan for his diabetes.  We do agree with him getting diabetic shoes for additional protection given his neuropathy history with callus formation.  Forms completed for him to get diabetic shoe wear and we have also strongly encourage regular podiatry follow-up   No orders of the defined types were placed in this encounter.   Follow-up: No follow-ups on file.    Carolann Littler, MD

## 2021-05-17 ENCOUNTER — Other Ambulatory Visit: Payer: Self-pay | Admitting: Adult Health

## 2021-05-22 ENCOUNTER — Telehealth: Payer: Self-pay | Admitting: Pharmacist

## 2021-05-22 NOTE — Chronic Care Management (AMB) (Signed)
Chronic Care Management Pharmacy Assistant   Name: Russell Austin  MRN: [id number] DOB: Jul 17, 1951   Reason for Encounter: Disease State Diabetes Assessment Call   Conditions to be addressed/monitored: DMII  Recent office visits:  05-16-2021 [name], MD - Patient presented for Type 2 diabetes mellitus with diabetic neuropathy, with long-term current use of insulin and other concerns. No medication changes.  Recent consult visits:  None  Hospital visits:  None in previous 6 months  Medications: Outpatient Encounter Medications as of 05/22/2021  Medication Sig   amLODipine (NORVASC) 10 MG tablet Take 1 tablet (10 mg total) by mouth daily.   aspirin EC 81 MG tablet Take 81 mg by mouth daily.   atorvastatin (LIPITOR) 10 MG tablet Take 1 tablet (10 mg total) by mouth daily.   buPROPion (WELLBUTRIN SR) 150 MG 12 hr tablet Take 1 tablet (150 mg total) by mouth daily.   chlorthalidone (HYGROTON) 25 MG tablet Take 12.5 mg by mouth daily.   Continuous Blood Gluc Receiver (FREESTYLE LIBRE 14 DAY READER) DEVI Use with freestyle libre system   Continuous Blood Gluc Sensor (FREESTYLE LIBRE 14 DAY SENSOR) MISC Use with libre system   diclofenac Sodium (VOLTAREN) 1 % GEL Apply 4 g topically 4 (four) times daily.   gabapentin (NEURONTIN) 300 MG capsule TAKE 1 CAPSULE BY MOUTH EVERYDAY AT BEDTIME   glipiZIDE (GLUCOTROL XL) 2.5 MG 24 hr tablet Take 1 tablet (2.5 mg total) by mouth in the morning and at bedtime.   insulin glargine (LANTUS SOLOSTAR) 100 UNIT/ML Solostar Pen INJECT 35 UNITS INTO THE SKIN DAILY.   Insulin Pen Needle (B-D ULTRAFINE III SHORT PEN) 31G X 8 MM MISC USE WITH LANUTS PEN   losartan (COZAAR) 100 MG tablet TAKE ONE TABLET BY MOUTH EVERY MORNING   metoprolol tartrate (LOPRESSOR) 50 MG tablet Take 1 tablet (50 mg total) by mouth 2 (two) times daily.   mometasone-formoterol (DULERA) 100-5 MCG/ACT AERO Inhale 2 puffs into the lungs 2 (two) times daily.    Spacer/Aero-Holding Chambers (AEROCHAMBER MAX W/FLOW-VU) MISC by Does not apply route. Use as directed   tiZANidine (ZANAFLEX) 2 MG tablet Take 1 tablet (2 mg total) by mouth at bedtime.   No facility-administered encounter medications on file as of 05/22/2021.  Recent Relevant Labs: Lab Results  Component Value Date/Time   HGBA1C 7.5 (A) 03/10/2021 07:17 AM   HGBA1C 7.1 (H) 12/09/2020 08:04 AM   HGBA1C 7.4 (A) 08/25/2020 08:00 AM   HGBA1C 7.3 (A) 02/11/2020 07:09 AM   HGBA1C 8.1 (H) 11/11/2019 07:44 AM   HGBA1C 6.6 07/23/2018 09:00 AM   MICROALBUR 50.8 (H) 12/13/2014 08:01 AM   MICROALBUR 27.7 (H) 12/16/2013 09:38 AM    Kidney Function Lab Results  Component Value Date/Time   CREATININE 3.82 (H) 12/09/2020 08:04 AM   CREATININE 2.07 (H) 11/11/2019 07:44 AM   GFR 15.40 (L) 12/09/2020 08:04 AM   GFRNONAA 41 (L) 10/28/2018 09:12 AM   GFRAA 47 (L) 10/28/2018 09:12 AM    Current antihyperglycemic regimen:  Insulin glargine (Lantus Solostar), inject 20 units into skin daily -Using PRN  Glipizide ER 2.'5mg'$  , 1 tablet in the morning and at bedtime (takes first thing) What recent interventions/DTPs have been made to improve glycemic control:  Patient reports no changes. Have there been any recent hospitalizations or ED visits since last visit with CPP? None Patient denies hypoglycemic symptoms, including Pale, Sweaty, Shaky, Hungry, Nervous/irritable, and Vision changes Patient denies hyperglycemic symptoms, including blurry vision,  excessive thirst, fatigue, polyuria, and weakness How often are you checking your blood sugar? once daily What are your blood sugars ranging?  Fasting: This morning was 96 patient reports he is usually averaging 130 for fasting  During the week, how often does your blood glucose drop below 70? Patient reports this is not a usual occurrence but about 3 weeks ago he did have it drop below 70, has been fine since. Are you checking your feet daily/regularly?  Patient reports yes he just saw a podiatrist last week.   Adherence Review: Is the patient currently on a STATIN medication? Yes Is the patient currently on ACE/ARB medication? Yes Does the patient have >5 day gap between last estimated fill dates? No   Care Gaps: Zoster Vaccine - Overdue Foot Exam - Done [facility] 05-2021 COVID Booster #4 [employer]) - Overdue Flu Vaccine - Overdue AWV - Done 01-11-2021 [facility]CM - 07-18-21  Star Rating Drugs: Losartan (Cozaar) 100 mg - Last filled 9-15-222 [facility] Atorvastatin (Lipitor) 10 mg - Last filled 04-24-2021 [facility] Glipizide (Glucotrol xl) 2.5 mg - Last filled 05-17-2021 [facility] at [facility]  [place] Assistant [phone]                                                                                                           23 min spent

## 2021-05-22 NOTE — Progress Notes (Signed)
This encounter was created in error - please disregard.

## 2021-05-30 ENCOUNTER — Telehealth: Payer: Self-pay | Admitting: Podiatry

## 2021-05-30 NOTE — Telephone Encounter (Signed)
Received HTA auth # [id number] for diabetic shoes/inserts [phone] X6) valid 9.30.2022 thru 12.29.2022.Marland Kitchen

## 2021-06-07 ENCOUNTER — Other Ambulatory Visit: Payer: Self-pay

## 2021-06-08 ENCOUNTER — Encounter: Payer: Self-pay | Admitting: Adult Health

## 2021-06-08 ENCOUNTER — Ambulatory Visit (INDEPENDENT_AMBULATORY_CARE_PROVIDER_SITE_OTHER): Payer: HMO | Admitting: Adult Health

## 2021-06-08 VITALS — BP 170/90 | HR 80 | Temp 98.1°F | Ht 67.0 in | Wt 185.0 lb

## 2021-06-08 DIAGNOSIS — Z794 Long term (current) use of insulin: Secondary | ICD-10-CM

## 2021-06-08 DIAGNOSIS — I10 Essential (primary) hypertension: Secondary | ICD-10-CM | POA: Diagnosis not present

## 2021-06-08 DIAGNOSIS — E1140 Type 2 diabetes mellitus with diabetic neuropathy, unspecified: Secondary | ICD-10-CM | POA: Diagnosis not present

## 2021-06-08 DIAGNOSIS — Z23 Encounter for immunization: Secondary | ICD-10-CM | POA: Diagnosis not present

## 2021-06-08 LAB — POCT GLYCOSYLATED HEMOGLOBIN (HGB A1C): Hemoglobin A1C: 6.4 % — AB (ref 4.0–5.6)

## 2021-06-08 NOTE — Progress Notes (Signed)
Subjective:    Patient ID: [name], male    DOB: 01-07-1951, 70 y.o.   MRN: [id number]  HPI 70 year old male who  has a past medical history of Blindness, legal (RIGHT EYE SECONDARY TO ACUTE GLAUCOMA), Diabetes mellitus type II, Diabetic retinopathy (FOLLOWED BY DR Zadie Rhine), ED (erectile dysfunction), Glaucoma of both eyes, Hypertension, Left hydrocele, and Stroke ([place]).  He presents to the office today for 77-monthfollow-up regarding diabetes and hypertension.  Diabetes mellitus-currently prescribed Lantus 22 units daily as well as glipizide 2.5 mg twice daily.  He continues to use the l[place] to monitor his blood sugars.  Reports readings mostly in the 150s or below.  His last A1c had increased from 7.1-7.5.  During this time he was drinking Ensure and using a lot of over-the-counter cough medication that was likely high in sugar due to getting over [place]  No medication changes were done at this time.   Today he reports that he has cut out ice cream. His blood sugars have improved. He has had some episodes of hypoglycemia but has noticed that since he has been having a snack of almonds in the evening that he is having less hypoglycemic events. His blood sugar this morning was 113.   Lab Results  Component Value Date   HGBA1C 6.4 (A) 06/08/2021   Hypertension -prescribed Norvasc 10 mg daily, losartan 100 mg/HCTZ 25 mg daily, and metoprolol 50 mg twice daily.  He denies dizziness, lightheadedness, chest pain, shortness of breath.he does monitor his BP at home and has readings in the 130-140/70-80's. He did not take his blood pressure medication this morning.   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   Diabetes mellitus type II    Diabetic retinopathy FOLLOWED BY DR RZadie Rhine  ED  (erectile dysfunction)    Glaucoma of both eyes    Hypertension    Left hydrocele    Stroke ([facility]     Social History   Socioeconomic History   Marital status: Married    Spouse name: Not on file   Number of children: 1   Years of education: 12   Highest education level: Not on file  Occupational History   Occupation: Disabled  Tobacco Use   Smoking status: Never   Smokeless tobacco: Never  Substance and Sexual Activity   Alcohol use: No   Drug use: No   Sexual activity: Not on file  Other Topics Concern   Not on file  Social History Narrative   Lives at home with his wife and granddaughter.   Left-handed.   3 cups caffeine per day.   Social Determinants of Health   Financial Resource Strain: Low Risk    Difficulty of Paying Living Expenses: Not hard at all  Food Insecurity: No Food Insecurity   Worried About RCharity fundraiserin the Last Year: Never true   [place] the Last Year: Never true  Transportation Needs: No Transportation Needs   Lack of Transportation (Medical): No   Lack of Transportation (Non-Medical): No  Physical Activity: Inactive   Days of Exercise per Week: 0 days   Minutes of Exercise per Session: 0 min  Stress: No Stress Concern Present   Feeling of Stress :  Not at all  Social Connections: Socially Integrated   Frequency of Communication with Friends and Family: More than three times a week   Frequency of Social Gatherings with Friends and Family: Twice a week   Attends Religious Services: More than 4 times per year   Active Member of Clubs or Organizations: Yes   Attends Archivist Meetings: 1 to 4 times per year   Marital Status: Married  Human resources officer Violence: Not At Risk   Fear of Current or Ex-Partner: No   Emotionally Abused: No   Physically Abused: No   Sexually Abused: No    Past Surgical History:  Procedure Laterality Date   APPENDECTOMY  AGE EARLY 20'S   COLONOSCOPY  12/30/2020   every 5 years    HYDROCELE EXCISION  03/31/2012   Procedure: HYDROCELECTOMY ADULT;  Surgeon: Franchot Gallo, MD;  Location[facility];  Service: Urology;  Laterality: Left;  43 MINS     LEFT EYE LASER RETINA REPAIR  SEPT 2012   RIGHT EYE VITRECTOMY/ INSERTION GLAUCOMA SETON/ LASER REPAIR  12-13-2008   RETINAL ARTERY OCCLUSION /NEOVASCULAR GLAUCOMA/ HEMORRHAGE   RIGHT EYE VITRETOMY/ INSERTION GLAUCOMA SETON X2/ LASER  03-24-2009   RECURRENT HEMORRHAGE/ OCCLUSION INTERNAL SETON   SHOULDER ARTHROSCOPY Right 2005   undescended right testicle removed  1994    Family History  Problem Relation Age of Onset   Glaucoma Mother    Diabetes Mother    Stomach cancer Maternal Grandfather    Stroke Maternal Grandmother     Allergies  Allergen Reactions   Lactose Intolerance (Gi) Diarrhea   Apple Pectin [Pectin] Itching    ITCHY THROAT   Metformin And Related     D/t decreased kidney function    Peach [Prunus Persica] Itching    ITCHY THROAT   Morphine And Related Anxiety    Jittery    Current Outpatient Medications on File Prior to Visit  Medication Sig Dispense Refill   amLODipine (NORVASC) 10 MG tablet Take 1 tablet (10 mg total) by mouth daily. 90 tablet 2   aspirin EC 81 MG tablet Take 81 mg by mouth daily.     atorvastatin (LIPITOR) 10 MG tablet Take 1 tablet (10 mg total) by mouth daily. 90 tablet 3   buPROPion (WELLBUTRIN SR) 150 MG 12 hr tablet Take 1 tablet (150 mg total) by mouth daily. 90 tablet 0   gabapentin (NEURONTIN) 300 MG capsule TAKE 1 CAPSULE BY MOUTH EVERYDAY AT BEDTIME 90 capsule 0   glipiZIDE (GLUCOTROL XL) 2.5 MG 24 hr tablet Take 1 tablet (2.5 mg total) by mouth in the morning and at bedtime. 180 tablet 0   insulin glargine (LANTUS SOLOSTAR) 100 UNIT/ML Solostar Pen INJECT 35 UNITS INTO THE SKIN DAILY. 15 mL 1   tiZANidine (ZANAFLEX) 2 MG tablet Take 1 tablet (2 mg total) by mouth at bedtime. 90 tablet 1   chlorthalidone (HYGROTON) 25 MG tablet Take 12.5 mg by  mouth daily.     Continuous Blood Gluc Receiver (FREESTYLE LIBRE 14 DAY READER) DEVI Use with freestyle libre system 1 each 0   Continuous Blood Gluc Sensor (FREESTYLE LIBRE 14 DAY SENSOR) MISC Use with libre system 6 each 3   diclofenac Sodium (VOLTAREN) 1 % GEL Apply 4 g topically 4 (four) times daily. 350 g 0   Insulin Pen Needle (B-D ULTRAFINE III SHORT PEN) 31G X 8 MM MISC USE WITH LANUTS PEN 100 each 3   losartan (COZAAR) 100 MG tablet  TAKE ONE TABLET BY MOUTH EVERY MORNING 30 tablet 0   metoprolol tartrate (LOPRESSOR) 50 MG tablet Take 1 tablet (50 mg total) by mouth 2 (two) times daily. 180 tablet 3   mometasone-formoterol (DULERA) 100-5 MCG/ACT AERO Inhale 2 puffs into the lungs 2 (two) times daily.     Spacer/Aero-Holding Chambers (AEROCHAMBER MAX W/FLOW-VU) MISC by Does not apply route. Use as directed     No current facility-administered medications on file prior to visit.    BP (!) 170/90   Pulse 80   Temp 98.1 F (36.7 C) (Oral)   Ht '5\' 7"'$  (1.702 m)   Wt 185 lb (83.9 kg)   SpO2 98%   BMI 28.98 kg/m       Objective:   Physical Exam Vitals and nursing note reviewed.  Constitutional:      Appearance: Normal appearance.  Cardiovascular:     Rate and Rhythm: Normal rate and regular rhythm.     Pulses: Normal pulses.     Heart sounds: Normal heart sounds.  Pulmonary:     Effort: Pulmonary effort is normal.     Breath sounds: Normal breath sounds.  Musculoskeletal:        General: Normal range of motion.  Skin:    General: Skin is warm and dry.     Capillary Refill: Capillary refill takes less than 2 seconds.  Neurological:     General: No focal deficit present.     Mental Status: He is alert and oriented to person, place, and time.  Psychiatric:        Mood and Affect: Mood normal.        Behavior: Behavior normal.        Thought Content: Thought content normal.      Assessment & Plan:   1. Type 2 diabetes mellitus with diabetic neuropathy, with long-term  current use of insulin (HCC)  - POC HgB A1c- 6.4 - at goal  - Continue with current medication therapy - Follow up in three months or sooner if needed  2. Essential hypertension - Better controlled at home.  - Continue current medications   3. Need for immunization against influenza  - Flu Vaccine QUAD High Dose(Fluad)

## 2021-06-08 NOTE — Patient Instructions (Signed)
It was great seeing you today   Your A1c was 6.4! This has dropped from 7.5.   Continue to have a snack in the evening to keep your blood sugars from dropping throughout the night.   I will see you back in 3 months

## 2021-06-19 ENCOUNTER — Encounter: Payer: Self-pay | Admitting: Podiatry

## 2021-06-19 ENCOUNTER — Other Ambulatory Visit: Payer: Self-pay

## 2021-06-19 ENCOUNTER — Ambulatory Visit: Payer: HMO | Admitting: Podiatry

## 2021-06-19 DIAGNOSIS — M79674 Pain in right toe(s): Secondary | ICD-10-CM

## 2021-06-19 DIAGNOSIS — L84 Corns and callosities: Secondary | ICD-10-CM | POA: Diagnosis not present

## 2021-06-19 DIAGNOSIS — N184 Chronic kidney disease, stage 4 (severe): Secondary | ICD-10-CM | POA: Diagnosis not present

## 2021-06-19 DIAGNOSIS — M2011 Hallux valgus (acquired), right foot: Secondary | ICD-10-CM | POA: Diagnosis not present

## 2021-06-19 DIAGNOSIS — E119 Type 2 diabetes mellitus without complications: Secondary | ICD-10-CM

## 2021-06-19 DIAGNOSIS — M79675 Pain in left toe(s): Secondary | ICD-10-CM | POA: Diagnosis not present

## 2021-06-19 DIAGNOSIS — E1140 Type 2 diabetes mellitus with diabetic neuropathy, unspecified: Secondary | ICD-10-CM | POA: Diagnosis not present

## 2021-06-19 DIAGNOSIS — Z794 Long term (current) use of insulin: Secondary | ICD-10-CM | POA: Diagnosis not present

## 2021-06-19 DIAGNOSIS — B351 Tinea unguium: Secondary | ICD-10-CM

## 2021-06-19 DIAGNOSIS — M2012 Hallux valgus (acquired), left foot: Secondary | ICD-10-CM

## 2021-06-19 NOTE — Progress Notes (Signed)
ANNUAL DIABETIC FOOT EXAM  Subjective: Russell Austin presents today for for annual diabetic foot examination.  He is accompanied by his wife on today's visit.  Patient relates 10 year h/o diabetes.  Patient denies any h/o foot wounds, but was treated for blood blister secondary to ill fitting shoe gear earlier this year. This healed uneventfully.  Patient denies any symptoms of foot numbness.  Patient denies any symptoms of foot tingling.  Patient does endorse symptoms of burning of heels on occasion.  Patient denies any symptoms of pins/needles in feet.  Patient's blood sugar was 108 mg/dl today.   Russell Peng, NP is patient's PCP. Last visit was 06/08/2021.  Patient states he was measured for diabetic shoes earlier this month.  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   Diabetes mellitus type II    Diabetic retinopathy FOLLOWED BY DR Zadie Rhine   ED (erectile dysfunction)    Glaucoma of both eyes    Hypertension    Left hydrocele    Stroke [facility])    Patient Active Problem List   Diagnosis Date Noted   Diabetes mellitus ([place]) 07/14/2020   Hyperlipidemia 07/14/2020   Osteoarthritis 07/14/2020   Plantar fasciitis 07/14/2020   Stasis edema of both lower extremities 01/01/2019   Primary osteoarthritis of left shoulder 03/20/2018   Spondylosis of 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 myelopathy or radiculopathy 03/20/2018   Complex regional pain syndrome I of upper limb 06/27/2015   Dysphagia following cerebral infarction 02/15/2015   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)    CKD stage 2 due to type 2 diabetes mellitus ([place]) 02/12/2015   Right hemiparesis ([place])    Type 2 diabetes mellitus with diabetic neuropathy ([place]) 02/10/2015   Neck pain    Numbness and tingling 02/09/2015   Left shoulder pain 07/17/2012   Plantar fasciitis, bilateral 07/17/2012   Peripheral neuropathy 11/06/2007   Blind right  eye 10/09/2007   Essential hypertension 07/08/2007   EXTRINSIC ASTHMA, UNSPECIFIED 06/25/2007   Past Surgical History:  Procedure Laterality Date   APPENDECTOMY  AGE EARLY 20'S   COLONOSCOPY  12/30/2020   every 5 years   HYDROCELE EXCISION  03/31/2012   Procedure: HYDROCELECTOMY ADULT;  Surgeon: Franchot Gallo, MD;  Location: [facility];  Service: Urology;  Laterality: Left;  35 MINS     LEFT EYE LASER RETINA REPAIR  SEPT 2012   RIGHT EYE VITRECTOMY/ INSERTION GLAUCOMA SETON/ LASER REPAIR  12-13-2008   RETINAL ARTERY OCCLUSION /NEOVASCULAR GLAUCOMA/ HEMORRHAGE   RIGHT EYE VITRETOMY/ INSERTION GLAUCOMA SETON X2/ LASER  03-24-2009   RECURRENT HEMORRHAGE/ OCCLUSION INTERNAL SETON   SHOULDER ARTHROSCOPY Right 2005   undescended right testicle removed  1994   Current Outpatient Medications on File Prior to Visit  Medication Sig Dispense Refill   amLODipine (NORVASC) 10 MG tablet Take 1 tablet (10 mg total) by mouth daily. 90 tablet 2   aspirin EC 81 MG tablet Take 81 mg by mouth daily.     atorvastatin (LIPITOR) 10 MG tablet Take 1 tablet (10 mg total) by mouth daily. 90 tablet 3   buPROPion (WELLBUTRIN SR) 150 MG 12 hr tablet Take 1 tablet (150 mg total) by mouth daily. 90 tablet 0   chlorthalidone (HYGROTON) 25 MG tablet Take 12.5 mg by mouth daily.     Continuous Blood Gluc Receiver (FREESTYLE LIBRE 14 DAY READER) DEVI Use with freestyle libre system 1 each 0   Continuous  Blood Gluc Sensor (FREESTYLE LIBRE 14 DAY SENSOR) MISC Use with libre system 6 each 3   diclofenac Sodium (VOLTAREN) 1 % GEL Apply 4 g topically 4 (four) times daily. 350 g 0   gabapentin (NEURONTIN) 300 MG capsule TAKE 1 CAPSULE BY MOUTH EVERYDAY AT BEDTIME 90 capsule 0   glipiZIDE (GLUCOTROL XL) 2.5 MG 24 hr tablet Take 1 tablet (2.5 mg total) by mouth in the morning and at bedtime. 180 tablet 0   insulin glargine (LANTUS SOLOSTAR) 100 UNIT/ML Solostar Pen INJECT 35 UNITS INTO THE SKIN DAILY. 15 mL  1   Insulin Pen Needle (B-D ULTRAFINE III SHORT PEN) 31G X 8 MM MISC USE WITH LANUTS PEN 100 each 3   losartan (COZAAR) 100 MG tablet TAKE ONE TABLET BY MOUTH EVERY MORNING 30 tablet 0   metoprolol tartrate (LOPRESSOR) 50 MG tablet Take 1 tablet (50 mg total) by mouth 2 (two) times daily. 180 tablet 3   mometasone-formoterol (DULERA) 100-5 MCG/ACT AERO Inhale 2 puffs into the lungs 2 (two) times daily.     Spacer/Aero-Holding Chambers (AEROCHAMBER MAX W/FLOW-VU) MISC by Does not apply route. Use as directed     tiZANidine (ZANAFLEX) 2 MG tablet Take 1 tablet (2 mg total) by mouth at bedtime. 90 tablet 1   No current facility-administered medications on file prior to visit.    Allergies  Allergen Reactions   Lactose Intolerance (Gi) Diarrhea   Apple Pectin [Pectin] Itching    ITCHY THROAT   Metformin And Related     D/t decreased kidney function    Peach [Prunus Persica] Itching    ITCHY THROAT   Morphine And Related Anxiety    Jittery   Social History   Occupational History   Occupation: Disabled  Tobacco Use   Smoking status: Never   Smokeless tobacco: Never  Substance and Sexual Activity   Alcohol use: No   Drug use: No   Sexual activity: Not on file   Family History  Problem Relation Age of Onset   Glaucoma Mother    Diabetes Mother    Stomach cancer Maternal Grandfather    Stroke Maternal Grandmother    Immunization History  Administered Date(s) Administered   Fluad Quad(high Dose 65+) 05/14/2019, 05/26/2020, 06/08/2021   Influenza Split 07/02/2011, 07/17/2012   Influenza, High Dose Seasonal PF 05/29/2017   Influenza,inj,Quad PF,6+ Mos 05/13/2013, 06/15/2015, 06/19/2016, 06/30/2018   Influenza-Unspecified 06/30/2018   PFIZER(Purple Top)SARS-COV-2 Vaccination 09/25/2019, 10/16/2019, 06/07/2020   Pneumococcal Conjugate-13 10/10/2017   Pneumococcal Polysaccharide-23 12/20/2014, 11/11/2019   Td 02/02/2010     Review of Systems: Negative except as noted in the  HPI.   Objective: There were no vitals filed for this visit.  Russell Austin is a pleasant 70 y.o. male in NAD. AAO X 3.  Vascular Examination: Capillary refill time to digits immediate b/l lower extremities. Palpable DP pulse(s) b/l lower extremities Faintly palpable PT pulse(s) b/l lower extremities. Pedal hair present. Lower extremity skin temperature gradient within normal limits. No pain with calf compression b/l. +1 pitting edema b/l lower extremities.  Dermatological Examination: Pedal skin with normal turgor, texture and tone b/l lower extremities. Toenails 1-5 right, L hallux, L 2nd toe, L 3rd toe, and L 4th toe elongated, discolored, dystrophic, thickened, and crumbly with subungual debris and tenderness to dorsal palpation. Anonychia noted L 5th toe. Nailbed(s) epithelialized.  Hyperkeratotic lesion(s) submet head 5 left foot and plantar aspect of heel left foot.  No erythema, no edema, no drainage, no fluctuance.  Musculoskeletal Examination: Normal muscle strength 5/5 to all lower extremity muscle groups bilaterally. Hallux valgus with bunion deformity noted b/l lower extremities. Pes planus deformity noted right lower extremity.  Footwear Assessment: Does the patient wear appropriate shoes? Yes. Does the patient need inserts/orthotics? Yes.  Neurological Examination: Pt has subjective symptoms of neuropathy. Protective sensation intact 5/5 intact bilaterally with 10g monofilament b/l. Vibratory sensation intact b/l. Clonus negative b/l.  Hemoglobin A1C Latest Ref Rng & Units 06/08/2021 03/10/2021 12/09/2020 08/25/2020  HGBA1C 4.0 - 5.6 % 6.4(A) 7.5(A) 7.1(H) 7.4(A)  Some recent data might be hidden   Assessment: 1. Pain due to onychomycosis of toenails of both feet   2. Callus   3. Hallux valgus, acquired, bilateral   4. Type 2 diabetes mellitus with diabetic neuropathy, with long-term current use of insulin ([place])   5. Encounter for diabetic foot exam ([place])     ADA  Risk Categorization: Low Risk :  Patient has all of the following: Intact protective sensation No prior foot ulcer  No severe deformity Pedal pulses present  Plan: -Examined patient. -Diabetic foot examination performed today. We did discuss avoiding leather dress shoes which can rub bunion area as he had had a blood blister form from ill fitting shoe gear in the past. He and his wife related understanding. -Patient to continue soft, supportive shoe gear daily. He has already been measured for diabetic shoes and is awaiting their arrival. They are on back order. -Toenails 1-5 right, L hallux, L 2nd toe, L 3rd toe, and L 4th toe debrided in length and girth without iatrogenic bleeding with sterile nail nipper and dremel.  -Callus(es) submet head 5 left foot and plantar aspect of heel left foot pared utilizing sterile scalpel blade without complication or incident. Total number debrided =2. -Patient to report any pedal injuries to medical professional immediately. -Patient/POA to call should there be question/concern in the interim.  Return in about 3 months (around 09/19/2021) for diabetic foot care.  Marzetta Board, DPM

## 2021-06-19 NOTE — Patient Instructions (Signed)

## 2021-06-23 ENCOUNTER — Other Ambulatory Visit: Payer: Self-pay | Admitting: Adult Health

## 2021-06-23 DIAGNOSIS — Z794 Long term (current) use of insulin: Secondary | ICD-10-CM

## 2021-06-23 DIAGNOSIS — E1140 Type 2 diabetes mellitus with diabetic neuropathy, unspecified: Secondary | ICD-10-CM

## 2021-06-26 ENCOUNTER — Telehealth: Payer: Self-pay | Admitting: Pharmacist

## 2021-06-26 NOTE — Chronic Care Management (AMB) (Signed)
Chronic Care Management Pharmacy Assistant   Name: Russell Austin  MRN: [id number] DOB: Oct 10, 1950  Reason for Encounter: Medication Review   Conditions to be addressed/monitored: HLD, HTN, CKD, DMII  Recent office visits:  06/08/2021 [name] NP (PCP) - Patient was seen for Type 2 diabetes mellitus with diabetic neuropathy and additional issues. No medication changes. Follow up in 3 months.  Recent consult visits:  None  Hospital visits:  None  Medications: Outpatient Encounter Medications as of 06/26/2021  Medication Sig   amLODipine (NORVASC) 10 MG tablet Take 1 tablet (10 mg total) by mouth daily.   aspirin EC 81 MG tablet Take 81 mg by mouth daily.   atorvastatin (LIPITOR) 10 MG tablet Take 1 tablet (10 mg total) by mouth daily.   buPROPion (WELLBUTRIN SR) 150 MG 12 hr tablet TAKE ONE TABLET BY MOUTH EVERY MORNING   chlorthalidone (HYGROTON) 25 MG tablet Take 12.5 mg by mouth daily.   Continuous Blood Gluc Receiver (FREESTYLE LIBRE 14 DAY READER) DEVI Use with freestyle libre system   Continuous Blood Gluc Sensor (FREESTYLE LIBRE 14 DAY SENSOR) MISC Use with libre system   diclofenac Sodium (VOLTAREN) 1 % GEL Apply 4 g topically 4 (four) times daily.   gabapentin (NEURONTIN) 300 MG capsule TAKE ONE CAPSULE BY MOUTH EVERYDAY AT BEDTIME   glipiZIDE (GLUCOTROL XL) 2.5 MG 24 hr tablet TAKE ONE TABLET BY MOUTH EVERY MORNING and TAKE ONE TABLET BY MOUTH EVERY EVENING   insulin glargine (LANTUS SOLOSTAR) 100 UNIT/ML Solostar Pen INJECT 35 UNITS INTO THE SKIN DAILY.   Insulin Pen Needle (B-D ULTRAFINE III SHORT PEN) 31G X 8 MM MISC USE WITH LANUTS PEN   losartan (COZAAR) 100 MG tablet TAKE ONE TABLET BY MOUTH EVERY MORNING   metoprolol tartrate (LOPRESSOR) 50 MG tablet Take 1 tablet (50 mg total) by mouth 2 (two) times daily.   mometasone-formoterol (DULERA) 100-5 MCG/ACT AERO Inhale 2 puffs into the lungs 2 (two) times daily.   Spacer/Aero-Holding Chambers  (AEROCHAMBER MAX W/FLOW-VU) MISC by Does not apply route. Use as directed   tiZANidine (ZANAFLEX) 2 MG tablet Take 1 tablet (2 mg total) by mouth at bedtime.   No facility-administered encounter medications on file as of 06/26/2021.  Reviewed chart for medication changes ahead of medication coordination call.  No OVs, Consults, or hospital visits since last care coordination call/Pharmacist visit. (If appropriate, list visit date, provider name)  No medication changes indicated OR if recent visit, treatment plan here.  BP Readings from Last 3 Encounters:  06/08/21 (!) 170/90  05/16/21 (!) 180/90  03/10/21 (!) 150/80    Lab Results  Component Value Date   HGBA1C 6.4 (A) 06/08/2021     Patient obtains medications through Adherence Packaging  90 Days   Last adherence synchronized delivery included: Tizanidine 2 mg - take one tablet at bedtime Glipizide ER 2.5 mg - take one tablet every morning and one tablet every evening Bupropion SR 150 mg - take one tablet every morning Gabapentin 300 mg -  take one capsule at bedtime Metoprolol Tartrate 50 mg - take one tablet every morning and one tablet every evening Losartan 100 mg - take one tablet every morning  Patient is due for next adherence delivery on: 07/04/2021 . Called patient and reviewed medications and coordinated delivery. Patient is wanting to start all his meds listed on refill form to packaging every 90 days starting with delivery on 07/04/2021  This delivery to include: Losartan 100 mg -  take one tablet every morning Chlorthalidone 25 mg - 1/2 tablet at breakfast (pt takes 1 qod) Gabapentin 300 mg -  take one capsule at bedtime Bupropion SR 150 mg - take one tablet every morning Glipizide ER 2.5 mg - take one tablet every morning and one tablet every evening Amlodipine 10 mg - take one tablet at breakfast Tizanidine 2 mg - take one tablet at bedtime Aspirin 81 mg - take one tablet at breakfast Atorvastatin 10 mg -  take one tablet at breakfast Metoprolol Tartrate 50 mg - take one tablet every morning and one tablet every evening  Patient will need a short fill:None  Coordinated acute fill: None  Patient declined the following medications: None  Confirmed delivery date of 07/04/2021, advised patient that pharmacy will contact them the morning of delivery.   Care Gaps: Zoster Vaccine - Overdue COVID Booster #4 Therapist, music) - Overdue AWV - Done 01-11-2021 Last BP - 170/90 on 06/08/2021 Last A1C - 6.4 on 06/08/2021  Star Rating Drugs: Losartan 100 mg - Last filled 9-15-222 30 DS at [facility] Atorvastatin 10 mg - Last filled 04-24-2021 90 DS at [facility] Glipizide 2.5 mg - Last filled 05-17-2021 51 DS at [facility] [phone]

## 2021-06-28 DIAGNOSIS — E785 Hyperlipidemia, unspecified: Secondary | ICD-10-CM | POA: Diagnosis not present

## 2021-06-28 DIAGNOSIS — G8190 Hemiplegia, unspecified affecting unspecified side: Secondary | ICD-10-CM | POA: Diagnosis not present

## 2021-06-28 DIAGNOSIS — E11319 Type 2 diabetes mellitus with unspecified diabetic retinopathy without macular edema: Secondary | ICD-10-CM | POA: Diagnosis not present

## 2021-06-28 DIAGNOSIS — I129 Hypertensive chronic kidney disease with stage 1 through stage 4 chronic kidney disease, or unspecified chronic kidney disease: Secondary | ICD-10-CM | POA: Diagnosis not present

## 2021-06-28 DIAGNOSIS — N184 Chronic kidney disease, stage 4 (severe): Secondary | ICD-10-CM | POA: Diagnosis not present

## 2021-06-28 DIAGNOSIS — E1122 Type 2 diabetes mellitus with diabetic chronic kidney disease: Secondary | ICD-10-CM | POA: Diagnosis not present

## 2021-06-28 DIAGNOSIS — I639 Cerebral infarction, unspecified: Secondary | ICD-10-CM | POA: Diagnosis not present

## 2021-06-29 ENCOUNTER — Other Ambulatory Visit: Payer: Self-pay | Admitting: Adult Health

## 2021-06-30 ENCOUNTER — Other Ambulatory Visit: Payer: Self-pay

## 2021-06-30 MED ORDER — LOSARTAN POTASSIUM 100 MG PO TABS: 100.0000 mg | ORAL_TABLET | Freq: Every morning | ORAL | 1 refills | Status: DC

## 2021-07-05 ENCOUNTER — Telehealth: Payer: Self-pay | Admitting: Podiatry

## 2021-07-05 NOTE — Telephone Encounter (Signed)
Diabetic shoes/inserts in. Lvm for pt to call to schedule an appt to pick them up.Marland KitchenMarland Kitchen

## 2021-07-12 ENCOUNTER — Ambulatory Visit (INDEPENDENT_AMBULATORY_CARE_PROVIDER_SITE_OTHER): Payer: HMO | Admitting: Podiatrist

## 2021-07-12 ENCOUNTER — Telehealth: Payer: Self-pay | Admitting: *Deleted

## 2021-07-12 ENCOUNTER — Other Ambulatory Visit: Payer: Self-pay

## 2021-07-12 DIAGNOSIS — M2142 Flat foot [pes planus] (acquired), left foot: Secondary | ICD-10-CM

## 2021-07-12 DIAGNOSIS — E1140 Type 2 diabetes mellitus with diabetic neuropathy, unspecified: Secondary | ICD-10-CM | POA: Diagnosis not present

## 2021-07-12 DIAGNOSIS — M2141 Flat foot [pes planus] (acquired), right foot: Secondary | ICD-10-CM | POA: Diagnosis not present

## 2021-07-12 DIAGNOSIS — M2012 Hallux valgus (acquired), left foot: Secondary | ICD-10-CM | POA: Diagnosis not present

## 2021-07-12 DIAGNOSIS — Z794 Long term (current) use of insulin: Secondary | ICD-10-CM

## 2021-07-12 DIAGNOSIS — M2011 Hallux valgus (acquired), right foot: Secondary | ICD-10-CM | POA: Diagnosis not present

## 2021-07-12 NOTE — Progress Notes (Signed)
The patient presented to the office to day to pick up diabetic shoes and 3 pr diabetic custom inserts.  1 pr of  inserts were put in the shoes and the shoes were fitted to the patient.  The patient states they are comfortable and free of defect.  The patient was satisfied with the fit of the shoe.  Instructions for break in and wear were dispensed.  The  delivery documentation and break in instruction forms were signed and a copy of the paperwork was given to the patient.    We added a felt heel lift to the right shoe which helped with his limb length discrepancy.  We discussed for the next pair of diabetic shoes that he may want to get them somewhere they can do a buildup of the shoe on the outside.  I spoke with Dawn who advised me of a place in [place] that could do this called  [facility]- [phone] [address]  Rondall Allegra  [phone]  The other option is for [place] to do the diabetic shoes and inserts and a rx can be written for him to see the folks at [facility] for a lift added to the shoe itself.     He has an appointment with Dr. Elisha Ponder in January and may want to start thinking about his next pair at that time.

## 2021-07-12 NOTE — Chronic Care Management (AMB) (Signed)
  Care Management   Note  07/12/2021 Name: Russell Austin MRN: [id number] DOB: 05/26/1951  Russell Austin is a 70 y.o. year old male who is a primary care patient of Nafziger, [name], NP and is actively engaged with the care management team. I reached out to [employer] by phone today to assist with scheduling an initial visit with the RN Case Manager  Follow up plan: Telephone appointment with care management team member scheduled for:07/17/21  [place] Management  Direct Dial: [phone]

## 2021-07-17 ENCOUNTER — Ambulatory Visit (INDEPENDENT_AMBULATORY_CARE_PROVIDER_SITE_OTHER): Payer: HMO

## 2021-07-17 ENCOUNTER — Telehealth: Payer: Self-pay | Admitting: Pharmacist

## 2021-07-17 DIAGNOSIS — E785 Hyperlipidemia, unspecified: Secondary | ICD-10-CM

## 2021-07-17 DIAGNOSIS — E1140 Type 2 diabetes mellitus with diabetic neuropathy, unspecified: Secondary | ICD-10-CM

## 2021-07-17 DIAGNOSIS — I10 Essential (primary) hypertension: Secondary | ICD-10-CM

## 2021-07-17 NOTE — Patient Instructions (Signed)
Visit Information   PATIENT GOALS/PLAN OF CARE:  Care Plan : RN Care Manager Plan of Care  Updates made by [name], RN since 07/17/2021 12:00 AM     Problem: Chronic Disease Management and Care Coordination Needs (DM2,HTN and HLD   Priority: High     Long-Range Goal: Establish Plan of Care for Chronic Disease Management Needs (DM2,HTN and HLD)   This Visit's Progress: On track  Priority: High  Note:   Current Barriers:  Knowledge Deficits related to plan of care for management of HTN, HLD, and DMII Chronic Disease Management support and education needs related to HTN, HLD, and DMII States his blood sugars have been good and he is scanning his Freestyle Libre 3 times a day ranges from 69-200.  States he feels low around 60.  States he has not needed to take his insulin as he is keeping his CBG below 200 most of the time.  States his wife is preparing healthy meals and he is eating a snack at bedtime to avoid having a low during the night. States he has not been exercising but he has new diabetic shoes so he is thinking about going back to the gym.  States he has not been checking his B/P at home recently.  RNCM Clinical Goal(s):  Patient will verbalize understanding of plan for management of HTN, HLD, and DMII verbalize basic understanding of  HTN, HLD, and DMII disease process and self health management plan . take all medications exactly as prescribed and will call provider for medication related questions attend all scheduled medical appointments: CCM Pharm D 07/18/21, [name] NP 09/08/21 demonstrate Improved adherence to prescribed treatment plan for HTN, HLD, and DMII as evidenced by readings within limits, adherence to plan of care continue to work with RN Care Manager to address care management and care coordination needs related to  HTN, HLD, and DMII through collaboration with RN Care manager, provider, and care team.   Interventions: 1:1 collaboration with primary  care provider regarding development and update of comprehensive plan of care as evidenced by provider attestation and co-signature Inter-disciplinary care team collaboration (see longitudinal plan of care) Evaluation of current treatment plan related to  self management and patient's adherence to plan as established by provider   Hypertension Interventions: New goal. Last practice recorded BP readings:  BP Readings from Last 3 Encounters:  06/08/21 (!) 170/90  05/16/21 (!) 180/90  03/10/21 (!) 150/80  Most recent eGFR/CrCl: No results found for: EGFR  No components found for: CRCL  Evaluation of current treatment plan related to hypertension self management and patient's adherence to plan as established by provider; Provided education to patient re: stroke prevention, s/s of heart attack and stroke; Reviewed medications with patient and discussed importance of compliance; Counseled on the importance of exercise goals with target of 150 minutes per week Advised patient, providing education and rationale, to monitor blood pressure daily and record, calling PCP for findings outside established parameters;  Provided education on prescribed diet low sodium low CHO;  Hyperlipidemia Interventions: New goal. Medication review performed; medication list updated in electronic medical record.  Provider established cholesterol goals reviewed; Counseled on importance of regular laboratory monitoring as prescribed; Reviewed importance of limiting foods high in cholesterol;  Diabetes Interventions: New goal. Assessed patient's understanding of A1c goal: <7% Provided education to patient about basic DM disease process; Reviewed medications with patient and discussed importance of medication adherence; Counseled on importance of regular laboratory monitoring as prescribed; Provided  patient with written educational materials related to hypo and hyperglycemia and importance of correct  treatment; Reviewed scheduled/upcoming provider appointments including: CCM PharmD 07/18/21, [name] NP 09/08/21; Advised patient, providing education and rationale, to check cbg scan 3 times a day and record, calling provider for findings outside established parameters; Screening for signs and symptoms of depression related to chronic disease state;  Assessed social determinant of health barriers;  Lab Results  Component Value Date   HGBA1C 6.4 (A) 06/08/2021   Patient Goals/Self-Care Activities: Patient will self administer medications as prescribed Patient will attend all scheduled provider appointments Patient will call pharmacy for medication refills Patient will continue to perform ADL's independently Patient will continue to perform IADL's independently Patient will call provider office for new concerns or questions keep appointment with eye doctor check blood sugar at prescribed times: three times daily and when you have symptoms of low or high blood sugar check feet daily for cuts, sores or redness take the blood sugar meter to all doctor visits drink 6 to 8 glasses of water each day fill half of plate with vegetables manage portion size read food labels for fat, fiber, carbohydrates and portion size switch to low-fat or skim milk wash and dry feet carefully every day wear comfortable, cotton socks wear comfortable, well-fitting shoes - check blood pressure 3 times per week - choose a place to take my blood pressure (home, clinic or office, retail store) - keep a blood pressure log - keep all doctor appointments - call for medicine refill 2 or 3 days before it runs out - take all medications exactly as prescribed - call doctor when you experience any new symptoms - go to all doctor appointments as scheduled  Follow Up Plan:  Telephone follow up appointment with care management team member scheduled for:  09/11/21 The patient has been provided with contact information  for the care management team and has been advised to call with any health related questions or concerns.       Consent to CCM Services: Mr. Attig was given information about Chronic Care Management services including:  CCM service includes personalized support from designated clinical staff supervised by his physician, including individualized plan of care and coordination with other care providers 24/7 contact phone numbers for assistance for urgent and routine care needs. Service will only be billed when office clinical staff spend 20 minutes or more in a month to coordinate care. Only one practitioner may furnish and bill the service in a calendar month. The patient may stop CCM services at any time (effective at the end of the month) by phone call to the office staff. The patient will be responsible for cost sharing (co-pay) of up to 20% of the service fee (after annual deductible is met).  Patient agreed to services and verbal consent obtained.   The patient verbalized understanding of instructions, educational materials, and care plan provided today and agreed to receive a mailed copy of patient instructions, educational materials, and care plan.   Telephone follow up appointment with care management team member scheduled for: 09/11/21 9 AM Peter Garter RN, [facility], CDE Care Management Coordinator [facility] [phone], Mobile [phone]

## 2021-07-17 NOTE — Chronic Care Management (AMB) (Signed)
Chronic Care Management   CCM RN Visit Note  07/17/2021 Name: Russell Austin MRN: [id number] DOB: 11-Feb-1951  Subjective: Russell Austin is a 70 y.o. year old male who is a primary care patient of [name], NP. The care management team was consulted for assistance with disease management and care coordination needs.    Engaged with patient by telephone for initial visit in response to provider referral for case management and/or care coordination services.   Consent to Services:  The patient was given the following information about Chronic Care Management services today, agreed to services, and gave verbal consent: 1. CCM service includes personalized support from designated clinical staff supervised by the primary care provider, including individualized plan of care and coordination with other care providers 2. 24/7 contact phone numbers for assistance for urgent and routine care needs. 3. Service will only be billed when office clinical staff spend 20 minutes or more in a month to coordinate care. 4. Only one practitioner may furnish and bill the service in a calendar month. 5.The patient may stop CCM services at any time (effective at the end of the month) by phone call to the office staff. 6. The patient will be responsible for cost sharing (co-pay) of up to 20% of the service fee (after annual deductible is met). Patient agreed to services and consent obtained.  Patient agreed to services and verbal consent obtained.   Assessment: Review of patient past medical history, allergies, medications, health status, including review of consultants reports, laboratory and other test data, was performed as part of comprehensive evaluation and provision of chronic care management services.   SDOH (Social Determinants of Health) assessments and interventions performed:  SDOH Interventions    Flowsheet Row Most Recent Value  SDOH Interventions   Food Insecurity Interventions  Intervention Not Indicated  Financial Strain Interventions Intervention Not Indicated  Housing Interventions Intervention Not Indicated  Physical Activity Interventions Other (Comments)  [reviewed insurance fitness benefit]  Stress Interventions Intervention Not Indicated  Transportation Interventions Intervention Not Indicated        CCM Care Plan  Allergies  Allergen Reactions   Lactose Intolerance (Gi) Diarrhea   Apple Pectin [Pectin] Itching    ITCHY THROAT   Metformin And Related     D/t decreased kidney function    Peach [Prunus Persica] Itching    ITCHY THROAT   Morphine And Related Anxiety    Jittery    Outpatient Encounter Medications as of 07/17/2021  Medication Sig   amLODipine (NORVASC) 10 MG tablet Take 1 tablet (10 mg total) by mouth daily.   aspirin EC 81 MG tablet Take 81 mg by mouth daily.   atorvastatin (LIPITOR) 10 MG tablet Take 1 tablet (10 mg total) by mouth daily.   buPROPion (WELLBUTRIN SR) 150 MG 12 hr tablet TAKE ONE TABLET BY MOUTH EVERY MORNING   chlorthalidone (HYGROTON) 25 MG tablet Take 12.5 mg by mouth daily.   Continuous Blood Gluc Receiver (FREESTYLE LIBRE 14 DAY READER) DEVI Use with freestyle libre system   Continuous Blood Gluc Sensor (FREESTYLE LIBRE 14 DAY SENSOR) MISC Use with libre system   diclofenac Sodium (VOLTAREN) 1 % GEL Apply 4 g topically 4 (four) times daily.   gabapentin (NEURONTIN) 300 MG capsule TAKE ONE CAPSULE BY MOUTH EVERYDAY AT BEDTIME   glipiZIDE (GLUCOTROL XL) 2.5 MG 24 hr tablet TAKE ONE TABLET BY MOUTH EVERY MORNING and TAKE ONE TABLET BY MOUTH EVERY EVENING   insulin glargine (LANTUS SOLOSTAR) 100 UNIT/ML  Solostar Pen INJECT 35 UNITS INTO THE SKIN DAILY.   Insulin Pen Needle (B-D ULTRAFINE III SHORT PEN) 31G X 8 MM MISC USE WITH LANUTS PEN   losartan (COZAAR) 100 MG tablet Take 1 tablet (100 mg total) by mouth every morning.   metoprolol tartrate (LOPRESSOR) 50 MG tablet Take 1 tablet (50 mg total) by mouth 2 (two)  times daily.   mometasone-formoterol (DULERA) 100-5 MCG/ACT AERO Inhale 2 puffs into the lungs 2 (two) times daily.   Spacer/Aero-Holding Chambers (AEROCHAMBER MAX W/FLOW-VU) MISC by Does not apply route. Use as directed   tiZANidine (ZANAFLEX) 2 MG tablet Take 1 tablet (2 mg total) by mouth at bedtime.   No facility-administered encounter medications on file as of 07/17/2021.    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)    CKD stage 2 due to type 2 diabetes mellitus ([place]) 02/12/2015   Right hemiparesis ([place])    Type 2 diabetes mellitus with diabetic neuropathy ([place]) 02/10/2015   Neck pain    Numbness and tingling 02/09/2015   Left shoulder pain 07/17/2012   Plantar fasciitis, bilateral 07/17/2012   Peripheral neuropathy 11/06/2007   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   Essential hypertension 07/08/2007   EXTRINSIC ASTHMA, UNSPECIFIED 06/25/2007    Conditions to be addressed/monitored:HTN, HLD, and DMII  Care Plan : RN Care Manager Plan of Care  Updates made by [name], RN since 07/17/2021 12:00 AM     Problem: Chronic Disease Management and Care Coordination Needs (DM2,HTN and HLD   Priority: High     Long-Range Goal: Establish Plan of Care for Chronic Disease Management Needs (DM2,HTN and HLD)   This Visit's Progress: On track  Priority: High  Note:   Current Barriers:  Knowledge Deficits related to plan of care for management of HTN, HLD, and DMII Chronic Disease Management  support and education needs related to HTN, HLD, and DMII States his blood sugars have been good and he is scanning his Freestyle Libre 3 times a day ranges from 69-200.  States he feels low around 60.  States he has not needed to take his insulin as he is keeping his CBG below 200 most of the time.  States his wife is preparing healthy meals and he is eating a snack at bedtime to avoid having a low during the night. States he has not been exercising but he has new diabetic shoes so he is thinking about going back to the gym.  States he has not been checking his B/P at home recently.  RNCM Clinical Goal(s):  Patient will verbalize understanding of plan for management of HTN, HLD, and DMII verbalize basic understanding of  HTN, HLD, and DMII disease process and self health management plan . take all medications exactly as prescribed and will call provider for medication related questions attend all scheduled medical appointments: CCM Pharm D 07/18/21, [name] NP 09/08/21 demonstrate Improved adherence to prescribed treatment plan for HTN, HLD, and DMII as evidenced by readings within limits, adherence to plan of care continue to  work with Consulting civil engineer to address care management and care coordination needs related to  HTN, HLD, and DMII through collaboration with Consulting civil engineer, provider, and care team.   Interventions: 1:1 collaboration with primary care provider regarding development and update of comprehensive plan of care as evidenced by provider attestation and co-signature Inter-disciplinary care team collaboration (see longitudinal plan of care) Evaluation of current treatment plan related to  self management and patient's adherence to plan as established by provider   Hypertension Interventions: New goal. Last practice recorded BP readings:  BP Readings from Last 3 Encounters:  06/08/21 (!) 170/90  05/16/21 (!) 180/90  03/10/21 (!) 150/80  Most recent eGFR/CrCl: No results found  for: EGFR  No components found for: CRCL  Evaluation of current treatment plan related to hypertension self management and patient's adherence to plan as established by provider; Provided education to patient re: stroke prevention, s/s of heart attack and stroke; Reviewed medications with patient and discussed importance of compliance; Counseled on the importance of exercise goals with target of 150 minutes per week Advised patient, providing education and rationale, to monitor blood pressure daily and record, calling PCP for findings outside established parameters;  Provided education on prescribed diet low sodium low CHO;  Hyperlipidemia Interventions: New goal. Medication review performed; medication list updated in electronic medical record.  Provider established cholesterol goals reviewed; Counseled on importance of regular laboratory monitoring as prescribed; Reviewed importance of limiting foods high in cholesterol;  Diabetes Interventions: New goal. Assessed patient's understanding of A1c goal: <7% Provided education to patient about basic DM disease process; Reviewed medications with patient and discussed importance of medication adherence; Counseled on importance of regular laboratory monitoring as prescribed; Provided patient with written educational materials related to hypo and hyperglycemia and importance of correct treatment; Reviewed scheduled/upcoming provider appointments including: CCM PharmD 07/18/21, [name] NP 09/08/21; Advised patient, providing education and rationale, to check cbg scan 3 times a day and record, calling provider for findings outside established parameters; Screening for signs and symptoms of depression related to chronic disease state;  Assessed social determinant of health barriers;  Lab Results  Component Value Date   HGBA1C 6.4 (A) 06/08/2021   Patient Goals/Self-Care Activities: Patient will self administer medications as  prescribed Patient will attend all scheduled provider appointments Patient will call pharmacy for medication refills Patient will continue to perform ADL's independently Patient will continue to perform IADL's independently Patient will call provider office for new concerns or questions keep appointment with eye doctor check blood sugar at prescribed times: three times daily and when you have symptoms of low or high blood sugar check feet daily for cuts, sores or redness take the blood sugar meter to all doctor visits drink 6 to 8 glasses of water each day fill half of plate with vegetables manage portion size read food labels for fat, fiber, carbohydrates and portion size switch to low-fat or skim milk wash and dry feet carefully every day wear comfortable, cotton socks wear comfortable, well-fitting shoes - check blood pressure 3 times per week - choose a place to take my blood pressure (home, clinic or office, retail store) - keep a blood pressure log - keep all doctor appointments - call for medicine refill 2 or 3 days before it runs out - take all medications exactly as prescribed - call doctor when you experience any new symptoms - go to all doctor appointments as scheduled  Follow Up Plan:  Telephone follow up appointment with care  management team member scheduled for:  09/11/21 The patient has been provided with contact information for the care management team and has been advised to call with any health related questions or concerns.       Plan:Telephone follow up appointment with care management team member scheduled for:  09/11/21 The patient has been provided with contact information for the care management team and has been advised to call with any health related questions or concerns.  Peter Garter RN, Jackquline Denmark, CDE Care Management Coordinator [facility] [phone], Mobile [phone]

## 2021-07-17 NOTE — Chronic Care Management (AMB) (Signed)
    Chronic Care Management Pharmacy Assistant   Name: Russell Austin  MRN: [id number] DOB: February 03, 1951  07/17/21 APPOINTMENT REMINDER   Called patient No answer, left message of appointment on 11/15 at 12 via telephone visit with [name], Pharm D.   Notified to have all medications, supplements, blood pressure and/or blood sugar logs available during appointment and to return call if need to reschedule.    Care Gaps: Zoster Vaccine - Overdue COVID Booster #4 Therapist, music) - Overdue AWV - Done 01-11-2021 Last BP - 170/90 on 06/08/2021 Last A1C - 6.4 on 06/08/2021  Star Rating Drug: Losartan 100 mg - Last filled 06/30/21 90 DS at [facility] Atorvastatin 10 mg - Last filled 07/01/2021 90 DS at [facility] Glipizide 2.5 mg - Last filled 06/29/2021 90 DS at [facility]  Any gaps in medications fill history? None  Medications: Outpatient Encounter Medications as of 07/17/2021  Medication Sig   amLODipine (NORVASC) 10 MG tablet Take 1 tablet (10 mg total) by mouth daily.   aspirin EC 81 MG tablet Take 81 mg by mouth daily.   atorvastatin (LIPITOR) 10 MG tablet Take 1 tablet (10 mg total) by mouth daily.   buPROPion (WELLBUTRIN SR) 150 MG 12 hr tablet TAKE ONE TABLET BY MOUTH EVERY MORNING   chlorthalidone (HYGROTON) 25 MG tablet Take 12.5 mg by mouth daily.   Continuous Blood Gluc Receiver (FREESTYLE LIBRE 14 DAY READER) DEVI Use with freestyle libre system   Continuous Blood Gluc Sensor (FREESTYLE LIBRE 14 DAY SENSOR) MISC Use with libre system   diclofenac Sodium (VOLTAREN) 1 % GEL Apply 4 g topically 4 (four) times daily.   gabapentin (NEURONTIN) 300 MG capsule TAKE ONE CAPSULE BY MOUTH EVERYDAY AT BEDTIME   glipiZIDE (GLUCOTROL XL) 2.5 MG 24 hr tablet TAKE ONE TABLET BY MOUTH EVERY MORNING and TAKE ONE TABLET BY MOUTH EVERY EVENING   insulin glargine (LANTUS SOLOSTAR) 100 UNIT/ML Solostar Pen INJECT 35 UNITS INTO THE SKIN DAILY.   Insulin Pen Needle (B-D ULTRAFINE III SHORT PEN)  31G X 8 MM MISC USE WITH LANUTS PEN   losartan (COZAAR) 100 MG tablet Take 1 tablet (100 mg total) by mouth every morning.   metoprolol tartrate (LOPRESSOR) 50 MG tablet Take 1 tablet (50 mg total) by mouth 2 (two) times daily.   mometasone-formoterol (DULERA) 100-5 MCG/ACT AERO Inhale 2 puffs into the lungs 2 (two) times daily.   Spacer/Aero-Holding Chambers (AEROCHAMBER MAX W/FLOW-VU) MISC by Does not apply route. Use as directed   tiZANidine (ZANAFLEX) 2 MG tablet Take 1 tablet (2 mg total) by mouth at bedtime.   No facility-administered encounter medications on file as of 07/17/2021.    [place] Clinical Pharmacist Assistant [phone]

## 2021-07-18 ENCOUNTER — Ambulatory Visit: Payer: HMO | Admitting: Pharmacist

## 2021-07-18 DIAGNOSIS — I10 Essential (primary) hypertension: Secondary | ICD-10-CM

## 2021-07-18 DIAGNOSIS — E1140 Type 2 diabetes mellitus with diabetic neuropathy, unspecified: Secondary | ICD-10-CM

## 2021-07-18 DIAGNOSIS — Z794 Long term (current) use of insulin: Secondary | ICD-10-CM

## 2021-07-18 NOTE — Progress Notes (Signed)
Chronic Care Management Pharmacy Note  07/18/2021 Name:  Russell Austin MRN:  [id number] DOB:  [birth year]  Summary: BP not at goal < 130/80 per home readings A1c at goal < 7%  Recommendations/Changes made from today's visit: -Recommended bringing BP cuff to next office visit to ensure accuracy -Recommended daily or every other day BP monitoring at home -Recommended switching to phone for libreview to connect to blood sugars easier/virtually  Plan: BP follow up in 1-2 weeks  Subjective: Russell Austin is an 70 y.o. year old male who is a primary patient of [name], NP.  The CCM team was consulted for assistance with disease management and care coordination needs.    Engaged with patient by telephone for follow up visit in response to provider referral for pharmacy case management and/or care coordination services.   Consent to Services:  The patient was given information about Chronic Care Management services, agreed to services, and gave verbal consent prior to initiation of services.  Please see initial visit note for detailed documentation.   Patient Care Team: [name], NP as PCP - General (Family Medicine) [name], Darrick Grinder, MD as Consulting Physician (Allergy and Immunology) [facility] (Ophthalmology) Marshall Cork, MD (Ophthalmology) Zada Finders Joyice Faster, MD as Consulting Physician (Neurosurgery) [name] Shaune Pollack, MD as Consulting Physician (Nephrology) Pa, [facility] (Plastic Surgery) Viona Gilmore, [facility] as Pharmacist (Pharmacist) [name], RN as Case Manager  Recent office visits: 06/08/2021 [name] NP (PCP) - Patient was seen for Type 2 diabetes mellitus with diabetic neuropathy and additional issues. Influenza vaccine administered. A1c decreased to 6.4%. No medication changes. Follow up in 3 months.  05-16-2021 Burchette, Alinda Sierras, MD - Patient presented for Type 2 diabetes  mellitus with diabetic neuropathy, with long-term current use of insulin and other concerns. No medication changes.  03/10/21 [name], NP: Patient presented for DM follow up. BP elevated in office. A1c increased to 7.5%.  02/28/21 Colin Benton, DO: Patient presented for televisit for cough. Prescribed benzonatate.   02/28/21 Colin Benton, DO: Patient presented for televisit for COVID 19 infection. Patient preferred to hold off on medication.   02/20/21 Burchette, Alinda Sierras, MD: Patient presented for blister on foot.  01/11/21 [name], LPN: Patient presented for AWV.   Recent consult visits: 07/12/21 Trudie Buckler, DPM (podiatry): Patient presented to obtain diabetic shoes.  06/19/21 Acquanetta Sit, DPM (podiatry): Patient presented for nail trim.  03/17/21 Lanae Crumbly, DPM (podiatry): Patient presented for diabetic ulcer.  Hospital visits: None in previous 6 months   Objective:  Lab Results  Component Value Date   CREATININE 3.82 (H) 12/09/2020   BUN 53 (H) 12/09/2020   GFR 15.40 (L) 12/09/2020   GFRNONAA 41 (L) 10/28/2018   GFRAA 47 (L) 10/28/2018   NA 136 12/09/2020   K 4.2 12/09/2020   CALCIUM 8.6 12/09/2020   CO2 26 12/09/2020   GLUCOSE 95 12/09/2020    Lab Results  Component Value Date/Time   HGBA1C 6.4 (A) 06/08/2021 07:14 AM   HGBA1C 7.5 (A) 03/10/2021 07:17 AM   HGBA1C 7.1 (H) 12/09/2020 08:04 AM   HGBA1C 7.3 (A) 02/11/2020 07:09 AM   HGBA1C 8.1 (H) 11/11/2019 07:44 AM   HGBA1C 6.6 07/23/2018 09:00 AM   GFR 15.40 (L) 12/09/2020 08:04 AM   GFR 38.81 (L) 11/11/2019 07:44 AM   MICROALBUR 50.8 (H) 12/13/2014 08:01 AM   MICROALBUR 27.7 (H) 12/16/2013 09:38 AM    Last diabetic Eye  exam:  Lab Results  Component Value Date/Time   HMDIABEYEEXA Retinopathy (A) 04/17/2021 12:00 AM    Last diabetic Foot exam:  Lab Results  Component Value Date/Time   HMDIABFOOTEX done 02/02/2010 12:00 AM     Lab Results  Component Value Date   CHOL 98 12/09/2020    HDL 30.20 (L) 12/09/2020   LDLCALC 48 12/09/2020   TRIG 101.0 12/09/2020   CHOLHDL 3 12/09/2020    Hepatic Function Latest Ref Rng & Units 12/09/2020 11/11/2019 10/15/2018  Total Protein 6.0 - 8.3 g/dL 6.3 5.8(L) 6.6  Albumin 3.5 - 5.2 g/dL 3.7 3.4(L) 3.7  AST 0 - 37 U/L 17 16 14   ALT 0 - 53 U/L 16 17 12   Alk Phosphatase 39 - 117 U/L 135(H) 110 100  Total Bilirubin 0.2 - 1.2 mg/dL 1.0 1.0 1.0  Bilirubin, Direct 0.0 - 0.3 mg/dL - - -    Lab Results  Component Value Date/Time   TSH 2.03 12/09/2020 08:04 AM   TSH 3.47 11/11/2019 07:44 AM    CBC Latest Ref Rng & Units 12/09/2020 11/11/2019 10/28/2018  WBC 4.0 - 10.5 K/uL 3.7(L) 4.2 6.7  Hemoglobin 13.0 - 17.0 g/dL 12.2(L) 11.7(L) 10.0(L)  Hematocrit 39.0 - 52.0 % 37.0(L) 34.9(L) 32.0(L)  Platelets 150.0 - 400.0 K/uL 258.0 270.0 318    No results found for: VD25OH  Clinical ASCVD: Yes  The ASCVD Risk score (Arnett DK, et al., 2019) failed to calculate for the following reasons:   The valid total cholesterol range is 130 to 320 mg/dL    Depression screen [facility] 2/9 07/17/2021 01/11/2021 12/09/2020  Decreased Interest 0 0 0  Down, Depressed, Hopeless 0 0 0  PHQ - 2 Score 0 0 0  Altered sleeping - - 0  Tired, decreased energy - - 0  Change in appetite - - 0  Feeling bad or failure about yourself  - - 0  Trouble concentrating - - 1  Moving slowly or fidgety/restless - - 0  Suicidal thoughts - - 0  PHQ-9 Score - - 1  Difficult doing work/chores - - Not difficult at all  Some recent data might be hidden      Social History   Tobacco Use  Smoking Status Never  Smokeless Tobacco Never   BP Readings from Last 3 Encounters:  06/08/21 (!) 170/90  05/16/21 (!) 180/90  03/10/21 (!) 150/80   Pulse Readings from Last 3 Encounters:  06/08/21 80  05/16/21 82  03/10/21 87   Wt Readings from Last 3 Encounters:  06/08/21 185 lb (83.9 kg)  05/16/21 182 lb 4.8 oz (82.7 kg)  03/10/21 180 lb (81.6 kg)   BMI Readings from Last 3  Encounters:  06/08/21 28.98 kg/m  05/16/21 28.55 kg/m  03/10/21 28.19 kg/m    Assessment/Interventions: Review of patient past medical history, allergies, medications, health status, including review of consultants reports, laboratory and other test data, was performed as part of comprehensive evaluation and provision of chronic care management services.   SDOH:  (Social Determinants of Health) assessments and interventions performed: No  SDOH Screenings   Alcohol Screen: Not on file  Depression (PHQ2-9): Low Risk    PHQ-2 Score: 0  Financial Resource Strain: Low Risk    Difficulty of Paying Living Expenses: Not hard at all  Food Insecurity: No Food Insecurity   Worried About Charity fundraiser in the Last Year: Never true   Ran Out of Food in the Last Year: Never true  Housing:  Low Risk    Last Housing Risk Score: 0  Physical Activity: Inactive   Days of Exercise per Week: 0 days   Minutes of Exercise per Session: 0 min  Social Connections: Socially Integrated   Frequency of Communication with Friends and Family: More than three times a week   Frequency of Social Gatherings with Friends and Family: Twice a week   Attends Religious Services: More than 4 times per year   Active Member of [employer] or Organizations: Yes   Attends Archivist Meetings: 1 to 4 times per year   Marital Status: Married  Stress: No Stress Concern Present   Feeling of Stress : Not at all  Tobacco Use: Low Risk    Smoking Tobacco Use: Never   Smokeless Tobacco Use: Never   Passive Exposure: Not on file  Transportation Needs: No Transportation Needs   Lack of Transportation (Medical): No   Lack of Transportation (Non-Medical): No    CCM Care Plan  Allergies  Allergen Reactions   Lactose Intolerance (Gi) Diarrhea   Apple Pectin [Pectin] Itching    ITCHY THROAT   Metformin And Related     D/t decreased kidney function    Peach [Prunus Persica] Itching    ITCHY THROAT   Morphine And  Related Anxiety    Jittery    Medications Reviewed Today     Reviewed by [name], RN (Registered Nurse) on 07/17/21 at 0910  Med List Status: <None>   Medication Order Taking? Sig Documenting Provider Last Dose Status Informant  amLODipine (NORVASC) 10 MG tablet [id number] No Take 1 tablet (10 mg total) by mouth daily. Nafziger, [name], NP Taking Active   aspirin EC 81 MG tablet [id number] No Take 81 mg by mouth daily. [provider] Taking Active   atorvastatin (LIPITOR) 10 MG tablet [id number] No Take 1 tablet (10 mg total) by mouth daily. Nafziger, [name], NP Taking Active   buPROPion [facility] SR) 150 MG 12 hr tablet [id number]  TAKE ONE TABLET BY MOUTH EVERY MORNING Nafziger, [name], NP  Active   chlorthalidone (HYGROTON) 25 MG tablet [id number] No Take 12.5 mg by mouth daily. [provider] Taking Active   Continuous Blood Gluc Receiver (FREESTYLE LIBRE 14 DAY READER) DEVI [id number] No Use with freestyle libre system [place], [place], NP Taking Active   Continuous Blood Gluc Sensor (FREESTYLE LIBRE 14 DAY SENSOR) [place] [id number] No Use with libre system Nafziger, [place], NP Taking Active   diclofenac Sodium (VOLTAREN) 1 % GEL [id number] No Apply 4 g topically 4 (four) times daily. [place], Betty G, MD Taking Active   gabapentin (NEURONTIN) 300 MG capsule [id number]  TAKE ONE CAPSULE BY MOUTH EVERYDAY AT BEDTIME Nafziger, [name], NP  Active   glipiZIDE (GLUCOTROL XL) 2.5 MG 24 hr tablet [id number]  TAKE ONE TABLET BY MOUTH EVERY MORNING and TAKE ONE TABLET BY MOUTH EVERY EVENING Nafziger, [name], NP  Active   insulin glargine (LANTUS SOLOSTAR) 100 UNIT/ML Solostar Pen [id number] No INJECT 35 UNITS INTO THE SKIN DAILY. Nafziger, [name], NP Taking Active   Insulin Pen Needle (B-D ULTRAFINE III SHORT PEN) 31G X 8 MM MISC [id number] No USE WITH LANUTS PEN Nafziger, [name], NP Taking Active   losartan (COZAAR) 100 MG tablet [id number]  Take 1 tablet (100 mg total) by mouth every morning.  Nafziger, [name], NP  Active   metoprolol tartrate (LOPRESSOR) 50 MG tablet [id number] No Take 1 tablet (50 mg total) by mouth 2 (two) times daily. [name], NP  Taking Active   mometasone-formoterol (DULERA) 100-5 MCG/ACT AERO [id number] No Inhale 2 puffs into the lungs 2 (two) times daily. [name], Darrick Grinder, MD Taking Active   Spacer/Aero-Holding Chambers (AEROCHAMBER MAX W/FLOW-VU) MISC [id number] No by Does not apply route. Use as directed [name], Darrick Grinder, MD Taking Active   tiZANidine (ZANAFLEX) 2 MG tablet [id number] No Take 1 tablet (2 mg total) by mouth at bedtime. 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[place])    CKD stage 2 due to type 2 diabetes mellitus ([place]) 02/12/2015   Right hemiparesis ([place])    Type 2 diabetes mellitus with diabetic neuropathy ([place]) 02/10/2015   Neck pain    Numbness and tingling 02/09/2015   Left shoulder pain 07/17/2012   Plantar fasciitis, bilateral 07/17/2012   Peripheral neuropathy 11/06/2007   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   Essential hypertension 07/08/2007   EXTRINSIC ASTHMA, UNSPECIFIED 06/25/2007    Immunization History  Administered Date(s) Administered   Fluad Quad(high Dose 65+) 05/14/2019, 05/26/2020, 06/08/2021   Influenza Split 07/02/2011, 07/17/2012   Influenza, High Dose Seasonal PF 05/29/2017   Influenza,inj,Quad PF,6+ Mos 05/13/2013, 06/15/2015, 06/19/2016, 06/30/2018   Influenza-Unspecified  06/30/2018   PFIZER(Purple Top)SARS-COV-2 Vaccination 09/25/2019, 10/16/2019, 06/07/2020   Pneumococcal Conjugate-13 10/10/2017   Pneumococcal Polysaccharide-23 12/20/2014, 11/11/2019   Td 02/02/2010   Patient is liking the packages with medications because it's a lot easier for him.  Patient [place] Clamp - not filled/started?  Haven't taken BP medications this morning  BP Readings from Last 3 Encounters:  06/08/21 (!) 170/90  05/16/21 (!) 180/90  03/10/21 (!) 150/80     Conditions to be addressed/monitored:  Hypertension, Hyperlipidemia, Diabetes, Chronic Kidney Disease, and Irritability and Pain  Conditions addressed this visit: Hypertension, CKD  Care Plan : [place]  Updates made by Viona Gilmore, [place] since 07/18/2021 12:00 AM     Problem: Problem: Hypertension, Hyperlipidemia, Diabetes, Chronic Kidney Disease, and Irritability and Pain      Long-Range Goal: Patient-Specific Goal   Start Date: 04/19/2021  Expected End Date: 04/19/2022  Recent Progress: On track  Priority: High  Note:   Current Barriers:  Unable to independently monitor therapeutic efficacy Unable to achieve control of blood pressure  Unable to self administer medications as prescribed  Pharmacist Clinical Goal(s):  Patient will achieve adherence to monitoring guidelines and medication adherence to achieve therapeutic efficacy achieve control of blood pressure as evidenced by home readings  through collaboration with PharmD and provider.   Interventions: 1:1 collaboration with [name], NP regarding development and update of comprehensive plan of care as evidenced by provider attestation and co-signature Inter-disciplinary care team collaboration (see longitudinal plan of care) Comprehensive medication review performed; medication list updated in electronic medical record  Hypertension (BP goal <130/80) -Uncontrolled -Current treatment: Amlodipine 61m, 1 tablet once daily   Losartan 100 mg 1 tablet once daily  Metoprolol tartrate 546m 1 tablet twice daily  Chlorthalidone 25 mg  1/2 tablet daily -Medications previously tried: HCTZ (switched to chlorthalidone) -Current home readings: has been lazy with checking; 195/91 (this morning - arm cuff) -Current dietary habits: tries to limit salt some; does enjoy eating; [employer] sea salt  - doesn't use canned and frozen vegetables - not eating out more often -Current exercise habits: sitting down more than usual - can do sit ups, could go for a -Denies hypotensive/hypertensive symptoms -Educated on Daily salt intake goal < 2300 mg; Exercise goal of 150 minutes per week; Importance of home blood pressure monitoring; Proper BP monitoring technique; -Counseled to monitor BP at home at least once weekly, document, and provide log at future appointments -Counseled on diet and exercise extensively Recommended checking BP daily or every other day.  Hyperlipidemia: (LDL goal < 70) -Controlled -Current treatment: Atorvastatin 77m, 1 tablet once daily  -Medications previously tried: none  -Current dietary patterns: did not discuss -Current exercise habits: did not discuss -Educated on Cholesterol goals;  Exercise goal of 150 minutes per week; -Counseled on diet and exercise extensively Recommended to continue current medication  Diabetes (A1c goal <7%) -Controlled -Current medications: Insulin glargine (Lantus Solostar), inject 20 units into skin daily - not using it every day (not injecting for anything over 200) Glipizide ER 2.5 mg, 1 tablet in the morning and at bedtime (takes first thing) -Medications previously tried: metformin (kidney function), Jardiance (kidney function) -Current home glucose readings fasting glucose: 118, 87, 156, 63, 69, 94, 145, 114 (not been > 150 in a while); lowest 60 - feels shaky post prandial glucose: 240 (after ice cream) -Reports hypoglycemic/hyperglycemic symptoms -Current  meal patterns:  breakfast: did not discuss  lunch: did not discuss   dinner: did not discuss  snacks: did not discuss  drinks: did not discuss  -Current exercise: did not discuss -Educated on A1c and blood sugar goals; Exercise goal of 150 minutes per week; -Counseled to check feet daily and get yearly eye exams -Counseled on diet and exercise extensively Recommended to continue current medication  Depression/Anxiety/Irritability (Goal: minimize symptoms) -Not ideally controlled -Current treatment: Bupropion 150 mg once daily -Medications previously tried/failed: none -PHQ9: 0 -Educated on Benefits of medication for symptom control -Recommended to continue current medication  Right hemiparesis  (Goal: minimize symptoms) -Controlled -Current treatment  Gabapentin 3051m 1 capsule at bedtime  Tizanidine 28m26m1 tablet at bedtime  -Medications previously tried: cyclobenzaprine  -Recommended to continue current medication  History of stroke (Goal: prevent future strokes) -Controlled -Current treatment  Atorvastatin 71m84m tablet once daily  Aspirin 81mg56mtablet once daily -Medications previously tried: none  -Recommended to continue current medication  Health Maintenance -Vaccine gaps: shingrix, COVID booster -Current therapy:  none -Educated on Cost vs benefit of each product must be carefully weighed by individual consumer -Patient is satisfied with current therapy and denies issues -Recommended to continue as is  Patient Goals/Self-Care Activities Patient will:  - take medications as prescribed check glucose with Freestyle libre, document, and provide at future appointments check blood pressure weekly, document, and provide at future appointments target a minimum of 150 minutes of moderate intensity exercise weekly  Follow Up Plan: The care management team will reach out to the patient again over the next 14 days.            Medication Assistance: None  required.  Patient affirms current coverage meets needs.  Compliance/Adherence/Medication fill history: Care Gaps: COVID booster, shingrix Last BP - 170/90 on 06/08/2021 Last A1C - 6.4 on 06/08/2021  Star-Rating Drugs: Losartan 100 mg - Last filled 06/30/21 90 DS at [facility] Atorvastatin 10 mg - Last filled 07/01/2021 90 DS at [facility] Glipizide 2.5 mg - Last filled 06/29/2021 90 DS at [facility]  Patient's preferred pharmacy is:  [facility] - [place], [place] - [address] [place] [address] Phone: [phone] Fax: [phone]  Uses pill box? Yes Pt endorses 95% compliance  We discussed: Benefits of medication synchronization, packaging and delivery as well as enhanced pharmacist oversight with [facility]. Patient decided to: Utilize [facility] pharmacy for medication synchronization, packaging and delivery  Care Plan and Follow Up Patient Decision:  Patient agrees to Care Plan and Follow-up.  Plan: Telephone follow up appointment with care management team member scheduled for:  3 months  [name], PharmD, [place] Pharmacist [place] at [place] [phone]

## 2021-07-31 ENCOUNTER — Telehealth: Payer: Self-pay | Admitting: Pharmacist

## 2021-07-31 NOTE — Chronic Care Management (AMB) (Signed)
Chronic Care Management Pharmacy Assistant   Name: Russell Austin  MRN: [id number] DOB: 15-Jan-1951  Reason for Encounter: Disease State Hypertension Assessment Call     Conditions to be addressed/monitored: HTN  Recent office visits:  None  Recent consult visits:  None  Hospital visits:  None in previous 6 months  Medications: Outpatient Encounter Medications as of 07/31/2021  Medication Sig   amLODipine (NORVASC) 10 MG tablet Take 1 tablet (10 mg total) by mouth daily.   aspirin EC 81 MG tablet Take 81 mg by mouth daily.   atorvastatin (LIPITOR) 10 MG tablet Take 1 tablet (10 mg total) by mouth daily.   buPROPion (WELLBUTRIN SR) 150 MG 12 hr tablet TAKE ONE TABLET BY MOUTH EVERY MORNING   chlorthalidone (HYGROTON) 25 MG tablet Take 12.5 mg by mouth daily.   Continuous Blood Gluc Receiver (FREESTYLE LIBRE 14 DAY READER) DEVI Use with freestyle libre system   Continuous Blood Gluc Sensor (FREESTYLE LIBRE 14 DAY SENSOR) MISC Use with libre system   diclofenac Sodium (VOLTAREN) 1 % GEL Apply 4 g topically 4 (four) times daily.   gabapentin (NEURONTIN) 300 MG capsule TAKE ONE CAPSULE BY MOUTH EVERYDAY AT BEDTIME   glipiZIDE (GLUCOTROL XL) 2.5 MG 24 hr tablet TAKE ONE TABLET BY MOUTH EVERY MORNING and TAKE ONE TABLET BY MOUTH EVERY EVENING   insulin glargine (LANTUS SOLOSTAR) 100 UNIT/ML Solostar Pen INJECT 35 UNITS INTO THE SKIN DAILY.   Insulin Pen Needle (B-D ULTRAFINE III SHORT PEN) 31G X 8 MM MISC USE WITH LANUTS PEN   losartan (COZAAR) 100 MG tablet Take 1 tablet (100 mg total) by mouth every morning.   metoprolol tartrate (LOPRESSOR) 50 MG tablet Take 1 tablet (50 mg total) by mouth 2 (two) times daily.   mometasone-formoterol (DULERA) 100-5 MCG/ACT AERO Inhale 2 puffs into the lungs 2 (two) times daily.   Spacer/Aero-Holding Chambers (AEROCHAMBER MAX W/FLOW-VU) MISC by Does not apply route. Use as directed   tiZANidine (ZANAFLEX) 2 MG tablet Take 1 tablet (2 mg  total) by mouth at bedtime.   No facility-administered encounter medications on file as of 07/31/2021.  Reviewed chart prior to disease state call. Spoke with patient regarding BP  Recent Office Vitals: BP Readings from Last 3 Encounters:  06/08/21 (!) 170/90  05/16/21 (!) 180/90  03/10/21 (!) 150/80   Pulse Readings from Last 3 Encounters:  06/08/21 80  05/16/21 82  03/10/21 87    Wt Readings from Last 3 Encounters:  06/08/21 185 lb (83.9 kg)  05/16/21 182 lb 4.8 oz (82.7 kg)  03/10/21 180 lb (81.6 kg)     Kidney Function Lab Results  Component Value Date/Time   CREATININE 3.82 (H) 12/09/2020 08:04 AM   CREATININE 2.07 (H) 11/11/2019 07:44 AM   GFR 15.40 (L) 12/09/2020 08:04 AM   GFRNONAA 41 (L) 10/28/2018 09:12 AM   GFRAA 47 (L) 10/28/2018 09:12 AM    BMP Latest Ref Rng & Units 12/09/2020 11/11/2019 10/30/2018  Glucose 70 - 99 mg/dL 95 196(H) 368(H)  BUN 6 - 23 mg/dL 53(H) 31(H) 38(H)  Creatinine 0.40 - 1.50 mg/dL 3.82(H) 2.07(H) 1.99(H)  Sodium 135 - 145 mEq/L 136 139 136  Potassium 3.5 - 5.1 mEq/L 4.2 4.7 5.3(H)  Chloride 96 - 112 mEq/L 103 107 102  CO2 19 - 32 mEq/L 26 28 27   Calcium 8.4 - 10.5 mg/dL 8.6 8.7 8.7    Current antihypertensive regimen:  Amlodipine 10mg , 1 tablet once daily  Losartan 100  mg 1 tablet once daily  Metoprolol tartrate 50mg , 1 tablet twice daily  Chlorthalidone 25 mg 1/2 tablet daily How often are you checking your Blood Pressure? 1-2x per week Patient reports he has been doing but not as often as recommended (every other day) he reports he is doing more but more like once or twice a week Current home BP readings: 140/80 on today patient denies headaches, dizziness, he states he hasn't gotten any readings higher than that. He reports he takes his medication some in the morning and some at night did not miss any doses. Advised him I would check back with him in a few weeks for pressures he was in agreement. What recent interventions/DTPs have  been made by any provider to improve Blood Pressure control since last CPP Visit: No changes Any recent hospitalizations or ED visits since last visit with CPP? No What diet changes have been made to improve Blood Pressure Control?  Patient reports he is watching his salt a little What exercise is being done to improve your Blood Pressure Control?  Patient reports he is getting around pretty well.  Adherence Review: Is the patient currently on ACE/ARB medication? NO  Does the patient have >5 day gap between last estimated fill dates? No    Care Gaps: Zoster Vaccine - Overdue COVID Booster #4 [employer]) - Overdue AWV - Done 01-11-2021 CCM - 10/27/21 Last BP - 170/90 on 06/08/2021 Lab Results  Component Value Date   HGBA1C 6.4 (A) 06/08/2021     Star Rating Drugs: Losartan 100 mg - Last filled 06/30/21 90 [facility] Atorvastatin 10 mg - Last filled 07/01/2021 90 [facility] at [facility] Glipizide 2.5 mg - Last filled 06/29/2021 90 DS at [facility]   Patient Assistance: None per patient  [place] Pharmacist Assistant [phone]

## 2021-08-02 DIAGNOSIS — Z7984 Long term (current) use of oral hypoglycemic drugs: Secondary | ICD-10-CM | POA: Diagnosis not present

## 2021-08-02 DIAGNOSIS — E785 Hyperlipidemia, unspecified: Secondary | ICD-10-CM | POA: Diagnosis not present

## 2021-08-02 DIAGNOSIS — E1140 Type 2 diabetes mellitus with diabetic neuropathy, unspecified: Secondary | ICD-10-CM

## 2021-08-02 DIAGNOSIS — I10 Essential (primary) hypertension: Secondary | ICD-10-CM

## 2021-08-02 DIAGNOSIS — Z794 Long term (current) use of insulin: Secondary | ICD-10-CM

## 2021-08-15 ENCOUNTER — Telehealth: Payer: Self-pay | Admitting: Pharmacist

## 2021-08-15 NOTE — Chronic Care Management (AMB) (Signed)
Chronic Care Management Pharmacy Assistant   Name: Russell Austin  MRN: [id number] DOB: [birth year]  Reason for Encounter: Disease State / Hypertension Assessment   Conditions to be addressed/monitored: HTN  Recent office visits:  None  Recent consult visits:  None  Hospital visits:  None in previous 6 months  Medications: Outpatient Encounter Medications as of 08/15/2021  Medication Sig   amLODipine (NORVASC) 10 MG tablet Take 1 tablet (10 mg total) by mouth daily.   aspirin EC 81 MG tablet Take 81 mg by mouth daily.   atorvastatin (LIPITOR) 10 MG tablet Take 1 tablet (10 mg total) by mouth daily.   buPROPion (WELLBUTRIN SR) 150 MG 12 hr tablet TAKE ONE TABLET BY MOUTH EVERY MORNING   chlorthalidone (HYGROTON) 25 MG tablet Take 12.5 mg by mouth daily.   Continuous Blood Gluc Receiver (FREESTYLE LIBRE 14 DAY READER) DEVI Use with freestyle libre system   Continuous Blood Gluc Sensor (FREESTYLE LIBRE 14 DAY SENSOR) MISC Use with libre system   diclofenac Sodium (VOLTAREN) 1 % GEL Apply 4 g topically 4 (four) times daily.   gabapentin (NEURONTIN) 300 MG capsule TAKE ONE CAPSULE BY MOUTH EVERYDAY AT BEDTIME   glipiZIDE (GLUCOTROL XL) 2.5 MG 24 hr tablet TAKE ONE TABLET BY MOUTH EVERY MORNING and TAKE ONE TABLET BY MOUTH EVERY EVENING   insulin glargine (LANTUS SOLOSTAR) 100 UNIT/ML Solostar Pen INJECT 35 UNITS INTO THE SKIN DAILY.   Insulin Pen Needle (B-D ULTRAFINE III SHORT PEN) 31G X 8 MM MISC USE WITH LANUTS PEN   losartan (COZAAR) 100 MG tablet Take 1 tablet (100 mg total) by mouth every morning.   metoprolol tartrate (LOPRESSOR) 50 MG tablet Take 1 tablet (50 mg total) by mouth 2 (two) times daily.   mometasone-formoterol (DULERA) 100-5 MCG/ACT AERO Inhale 2 puffs into the lungs 2 (two) times daily.   Spacer/Aero-Holding Chambers (AEROCHAMBER MAX W/FLOW-VU) MISC by Does not apply route. Use as directed   tiZANidine (ZANAFLEX) 2 MG tablet Take 1 tablet (2 mg total) by  mouth at bedtime.   No facility-administered encounter medications on file as of 08/15/2021.  Reviewed chart prior to disease state call. Spoke with patient regarding BP  Recent Office Vitals: BP Readings from Last 3 Encounters:  06/08/21 (!) 170/90  05/16/21 (!) 180/90  03/10/21 (!) 150/80   Pulse Readings from Last 3 Encounters:  06/08/21 80  05/16/21 82  03/10/21 87    Wt Readings from Last 3 Encounters:  06/08/21 185 lb (83.9 kg)  05/16/21 182 lb 4.8 oz (82.7 kg)  03/10/21 180 lb (81.6 kg)     Kidney Function Lab Results  Component Value Date/Time   CREATININE 3.82 (H) 12/09/2020 08:04 AM   CREATININE 2.07 (H) 11/11/2019 07:44 AM   GFR 15.40 (L) 12/09/2020 08:04 AM   GFRNONAA 41 (L) 10/28/2018 09:12 AM   GFRAA 47 (L) 10/28/2018 09:12 AM    BMP Latest Ref Rng & Units 12/09/2020 11/11/2019 10/30/2018  Glucose 70 - 99 mg/dL 95 196(H) 368(H)  BUN 6 - 23 mg/dL 53(H) 31(H) 38(H)  Creatinine 0.40 - 1.50 mg/dL 3.82(H) 2.07(H) 1.99(H)  Sodium 135 - 145 mEq/L 136 139 136  Potassium 3.5 - 5.1 mEq/L 4.2 4.7 5.3(H)  Chloride 96 - 112 mEq/L 103 107 102  CO2 19 - 32 mEq/L 26 28 27   Calcium 8.4 - 10.5 mg/dL 8.6 8.7 8.7    Current antihypertensive regimen:  Amlodipine 10mg , 1 tablet once daily  Losartan 100 mg 1  tablet once daily  Metoprolol tartrate 50mg , 1 tablet twice daily  Chlorthalidone 25 mg 1/2 tablet daily How often are you checking your Blood Pressure? Patient reports he is checking about every other day Current home BP readings: Patient reports on yesterday was 140/80 and that is his average. Asked that he check on the phone was 168/78 patient reported at 11:30 am he had not taken his morning pills yet as he did not have anything to eat yet. Advised he would take them when we hung up. Advised him I would speak with him again in a few weeks before his next delivery of medications are due for more readings and to try and have a little something even if he isn't hungry so he  does not delay the timing of his medications. He was in agreement. What recent interventions/DTPs have been made by any provider to improve Blood Pressure control since last CPP Visit: Patient reports none Any recent hospitalizations or ED visits since last visit with CPP? No  Adherence Review: Is the patient currently on ACE/ARB medication? Yes Does the patient have >5 day gap between last estimated fill dates? No    Care Gaps: Zoster Vaccine - Overdue COVID Booster #4 [employer]) - Overdue AWV - Done 01-11-2021 CCM - 10/27/21 Last BP - 168/78 on 08/15/21 home PCP follow up in Jan and med call due in [place] for recheck Lab Results  Component Value Date   HGBA1C 6.4 (A) 06/08/2021    Star Rating Drugs: Losartan 100 mg - Last filled 06/30/21 90 DS at [facility] Atorvastatin 10 mg - Last filled 07/01/2021 90 DS at [facility] Glipizide 2.5 mg - Last filled 06/29/2021 90 DS at [facility]   Patient Assistance: None  [place] Clinical Pharmacist Assistant [phone]

## 2021-08-30 ENCOUNTER — Telehealth: Payer: Self-pay

## 2021-08-30 DIAGNOSIS — E113599 Type 2 diabetes mellitus with proliferative diabetic retinopathy without macular edema, unspecified eye: Secondary | ICD-10-CM | POA: Diagnosis not present

## 2021-08-30 DIAGNOSIS — H02421 Myogenic ptosis of right eyelid: Secondary | ICD-10-CM | POA: Diagnosis not present

## 2021-08-30 DIAGNOSIS — Q111 Other anophthalmos: Secondary | ICD-10-CM | POA: Diagnosis not present

## 2021-08-30 NOTE — Telephone Encounter (Signed)
Pt would like referral sent to [facility] Dr. Ernestina Columbia Md Tax ID number has changed. He Just needs a new referral sent the number is [phone]

## 2021-08-31 ENCOUNTER — Other Ambulatory Visit: Payer: Self-pay | Admitting: Adult Health

## 2021-08-31 DIAGNOSIS — H5440 Blindness, one eye, unspecified eye: Secondary | ICD-10-CM

## 2021-09-08 ENCOUNTER — Encounter: Payer: Self-pay | Admitting: Adult Health

## 2021-09-08 ENCOUNTER — Ambulatory Visit (INDEPENDENT_AMBULATORY_CARE_PROVIDER_SITE_OTHER): Payer: HMO | Admitting: Adult Health

## 2021-09-08 VITALS — BP 120/80 | HR 59 | Temp 98.0°F | Ht 67.0 in | Wt 184.0 lb

## 2021-09-08 DIAGNOSIS — I10 Essential (primary) hypertension: Secondary | ICD-10-CM

## 2021-09-08 DIAGNOSIS — E1140 Type 2 diabetes mellitus with diabetic neuropathy, unspecified: Secondary | ICD-10-CM | POA: Diagnosis not present

## 2021-09-08 DIAGNOSIS — H43822 Vitreomacular adhesion, left eye: Secondary | ICD-10-CM | POA: Diagnosis not present

## 2021-09-08 DIAGNOSIS — E113592 Type 2 diabetes mellitus with proliferative diabetic retinopathy without macular edema, left eye: Secondary | ICD-10-CM | POA: Diagnosis not present

## 2021-09-08 DIAGNOSIS — Z794 Long term (current) use of insulin: Secondary | ICD-10-CM | POA: Diagnosis not present

## 2021-09-08 LAB — POCT GLYCOSYLATED HEMOGLOBIN (HGB A1C): Hemoglobin A1C: 7.1 % — AB (ref 4.0–5.6)

## 2021-09-08 NOTE — Progress Notes (Signed)
Subjective:    Patient ID: Russell Austin, male    DOB: 17-Sep-1950, 71 y.o.   MRN: [id number]  HPI He presents to the office today for 28-month follow-up regarding diabetes and hypertension  Diabetes mellitus type 2-is currently prescribed Lantus 22 units daily and glipizide 2.5 mg twice daily. He reports that he has not been using his Insulin daily and uses it " just when needed" Maybe uses it twice a week.    He is using the [place] system to monitor his blood sugars.  Reports readings mostly in the 150s or below.His last A1c was 6.4 in October 2022.   Wt Readings from Last 3 Encounters:  09/08/21 184 lb (83.5 kg)  06/08/21 185 lb (83.9 kg)  05/16/21 182 lb 4.8 oz (82.7 kg)    HTN -prescribed Norvasc 10 mg daily, losartan 100 mg/HCTZ 25 mg daily, and metoprolol 50 mg twice daily.  He denies dizziness, lightheadedness, chest pain, or shortness of breath.  He does monitor his blood pressures at home with readings in the 130s to 140s over 70s to 80s.  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   Diabetes mellitus type II    Diabetic retinopathy FOLLOWED BY DR Zadie Rhine   ED (erectile dysfunction)    Glaucoma of both eyes    Hypertension    Left hydrocele    Stroke [facility])     Social History   Socioeconomic History   Marital status: Married    Spouse name: Not on file   Number of children: 1   Years of education: 12   Highest education level: Not on file  Occupational History   Occupation: Disabled  Tobacco Use   Smoking status: Never   Smokeless tobacco: Never  Substance and Sexual Activity   Alcohol use: No   Drug use: No   Sexual activity: Not on file  Other Topics Concern   Not on file  Social History Narrative   Lives at  home with his wife and granddaughter.   Left-handed.   3 cups caffeine per day.   Social Determinants of Health   Financial Resource Strain: Low Risk    Difficulty of Paying Living Expenses: Not hard at all  Food Insecurity: No Food Insecurity   Worried About Charity fundraiser in the Last Year: Never true   [place] in the Last Year: Never true  Transportation Needs: No Transportation Needs   Lack of Transportation (Medical): No   Lack of Transportation (Non-Medical): No  Physical Activity: Inactive   Days of Exercise per Week: 0 days   Minutes of Exercise per Session: 0 min  Stress: No Stress Concern Present   Feeling of Stress : Not at all  Social Connections: Socially Integrated   Frequency of Communication with Friends and Family: More than three times a week   Frequency of Social Gatherings with Friends and Family: Twice a week   Attends Religious Services: More than 4 times per year   Active Member of [employer] or Organizations: Yes   Attends Archivist Meetings: 1 to 4 times per year   Marital Status: Married  Human resources officer Violence: Not At Risk   Fear of Current  or Ex-Partner: No   Emotionally Abused: No   Physically Abused: No   Sexually Abused: No    Past Surgical History:  Procedure Laterality Date   APPENDECTOMY  AGE EARLY 20'S   COLONOSCOPY  12/30/2020   every 5 years   HYDROCELE EXCISION  03/31/2012   Procedure: HYDROCELECTOMY ADULT;  Surgeon: Franchot Gallo, MD;  Location: [facility];  Service: Urology;  Laterality: Left;  73 MINS     LEFT EYE LASER RETINA REPAIR  SEPT 2012   RIGHT EYE VITRECTOMY/ INSERTION GLAUCOMA SETON/ LASER REPAIR  12-13-2008   RETINAL ARTERY OCCLUSION /NEOVASCULAR GLAUCOMA/ HEMORRHAGE   RIGHT EYE VITRETOMY/ INSERTION GLAUCOMA SETON X2/ LASER  03-24-2009   RECURRENT HEMORRHAGE/ OCCLUSION INTERNAL SETON   SHOULDER ARTHROSCOPY Right 2005   undescended right testicle removed  1994    Family  History  Problem Relation Age of Onset   Glaucoma Mother    Diabetes Mother    Stomach cancer Maternal Grandfather    Stroke Maternal Grandmother     Allergies  Allergen Reactions   Lactose Intolerance (Gi) Diarrhea   Apple Pectin [Pectin] Itching    ITCHY THROAT   Metformin And Related     D/t decreased kidney function    Peach [Prunus Persica] Itching    ITCHY THROAT   Morphine And Related Anxiety    Jittery    Current Outpatient Medications on File Prior to Visit  Medication Sig Dispense Refill   amLODipine (NORVASC) 10 MG tablet Take 1 tablet (10 mg total) by mouth daily. 90 tablet 2   aspirin EC 81 MG tablet Take 81 mg by mouth daily.     atorvastatin (LIPITOR) 10 MG tablet Take 1 tablet (10 mg total) by mouth daily. 90 tablet 3   buPROPion (WELLBUTRIN SR) 150 MG 12 hr tablet TAKE ONE TABLET BY MOUTH EVERY MORNING 90 tablet 0   chlorthalidone (HYGROTON) 25 MG tablet Take 12.5 mg by mouth daily.     Continuous Blood Gluc Receiver (FREESTYLE LIBRE 14 DAY READER) DEVI Use with freestyle libre system 1 each 0   Continuous Blood Gluc Sensor (FREESTYLE LIBRE 14 DAY SENSOR) MISC Use with libre system 6 each 3   diclofenac Sodium (VOLTAREN) 1 % GEL Apply 4 g topically 4 (four) times daily. 350 g 0   gabapentin (NEURONTIN) 300 MG capsule TAKE ONE CAPSULE BY MOUTH EVERYDAY AT BEDTIME 90 capsule 0   glipiZIDE (GLUCOTROL XL) 2.5 MG 24 hr tablet TAKE ONE TABLET BY MOUTH EVERY MORNING and TAKE ONE TABLET BY MOUTH EVERY EVENING 180 tablet 0   insulin glargine (LANTUS SOLOSTAR) 100 UNIT/ML Solostar Pen INJECT 35 UNITS INTO THE SKIN DAILY. 15 mL 1   Insulin Pen Needle (B-D ULTRAFINE III SHORT PEN) 31G X 8 MM MISC USE WITH LANUTS PEN 100 each 3   losartan (COZAAR) 100 MG tablet Take 1 tablet (100 mg total) by mouth every morning. 90 tablet 1   metoprolol tartrate (LOPRESSOR) 50 MG tablet Take 1 tablet (50 mg total) by mouth 2 (two) times daily. 180 tablet 3   mometasone-formoterol (DULERA)  100-5 MCG/ACT AERO Inhale 2 puffs into the lungs 2 (two) times daily.     Spacer/Aero-Holding Chambers (AEROCHAMBER MAX W/FLOW-VU) MISC by Does not apply route. Use as directed     tiZANidine (ZANAFLEX) 2 MG tablet Take 1 tablet (2 mg total) by mouth at bedtime. 90 tablet 1   No current facility-administered medications on file prior to visit.  BP 120/80    Pulse (!) 59    Temp 98 F (36.7 C) (Oral)    Ht 5\' 7"  (1.702 m)    Wt 184 lb (83.5 kg)    SpO2 100%    BMI 28.82 kg/m       Objective:   Physical Exam Vitals and nursing note reviewed.  Constitutional:      Appearance: Normal appearance.  Cardiovascular:     Rate and Rhythm: Normal rate and regular rhythm.     Pulses: Normal pulses.     Heart sounds: Normal heart sounds.  Pulmonary:     Effort: Pulmonary effort is normal.     Breath sounds: Normal breath sounds.  Skin:    General: Skin is warm and dry.     Capillary Refill: Capillary refill takes less than 2 seconds.  Neurological:     General: No focal deficit present.     Mental Status: He is alert and oriented to person, place, and time.  Psychiatric:        Mood and Affect: Mood normal.        Behavior: Behavior normal.        Thought Content: Thought content normal.        Judgment: Judgment normal.      Assessment & Plan:  1. Type 2 diabetes mellitus with diabetic neuropathy, with long-term current use of insulin (HCC)  - POC HgB A1c- 7.1  - Will decrease his Lantus to 10 units daily since he has not been taking it daily  - needs exercise   2. Essential hypertension - Well controlled.  - No change in medication.   [name], NP

## 2021-09-08 NOTE — Patient Instructions (Signed)
Take your insulin every day - 10 units   I will see you back anytime after April 8th for your physical

## 2021-09-11 ENCOUNTER — Ambulatory Visit (INDEPENDENT_AMBULATORY_CARE_PROVIDER_SITE_OTHER): Payer: HMO

## 2021-09-11 DIAGNOSIS — E785 Hyperlipidemia, unspecified: Secondary | ICD-10-CM

## 2021-09-11 DIAGNOSIS — G8191 Hemiplegia, unspecified affecting right dominant side: Secondary | ICD-10-CM

## 2021-09-11 DIAGNOSIS — I10 Essential (primary) hypertension: Secondary | ICD-10-CM

## 2021-09-11 DIAGNOSIS — Z794 Long term (current) use of insulin: Secondary | ICD-10-CM

## 2021-09-11 DIAGNOSIS — E1140 Type 2 diabetes mellitus with diabetic neuropathy, unspecified: Secondary | ICD-10-CM

## 2021-09-11 NOTE — Patient Instructions (Signed)
Visit Information  Thank you for taking time to visit with me today. Please don't hesitate to contact me if I can be of assistance to you before our next scheduled telephone appointment.  Following are the goals we discussed today:  Take all medications as prescribed Attend all scheduled provider appointments Call pharmacy for medication refills 3-7 days in advance of running out of medications Call provider office for new concerns or questions  keep appointment with eye doctor check blood sugar at prescribed times: three times daily and when you have symptoms of low or high blood sugar check feet daily for cuts, sores or redness trim toenails straight across drink 6 to 8 glasses of water each day fill half of plate with vegetables limit fast food meals to no more than 1 per week manage portion size switch to sugar-free drinks check blood pressure 3 times per week choose a place to take my blood pressure (home, clinic or office, retail store) call doctor for signs and symptoms of high blood pressure take medications for blood pressure exactly as prescribed eat more whole grains, fruits and vegetables, lean meats and healthy fats limit salt intake to 2300mg /day call for medicine refill 2 or 3 days before it runs out take all medications exactly as prescribed call doctor with any symptoms you believe are related to your medicine  Our next appointment is by telephone on 11/09/21 at 10 AM  Please call the care guide team at [phone] if you need to cancel or reschedule your appointment.   If you are experiencing a Mental Health or [place] or need someone to talk to, please call the Suicide and Crisis Lifeline: 988 call 1-800-273-TALK (toll free, 24 hour hotline) go to [facility] [address] [place] [phone]) call 911   The patient verbalized understanding of instructions, educational materials, and care plan provided  today and declined offer to receive copy of patient instructions, educational materials, and care plan.   Peter Garter RN, Jackquline Denmark, CDE Care Management Coordinator [facility] [phone], Mobile [phone]

## 2021-09-11 NOTE — Chronic Care Management (AMB) (Signed)
Care Management    RN Visit Note  09/11/2021 Name: Russell Austin MRN: [id number] DOB: 01-17-1951  Subjective: Russell Austin is a 71 y.o. year old male who is a primary care patient of [name], NP. The care management team was consulted for assistance with disease management and care coordination needs.    Engaged with patient by telephone for follow up visit in response to provider referral for case management and/or care coordination services.   Consent to Services:   Mr. Carias was given information about Care Management services today including:  Care Management services includes personalized support from designated clinical staff supervised by his physician, including individualized plan of care and coordination with other care providers 24/7 contact phone numbers for assistance for urgent and routine care needs. The patient may stop case management services at any time by phone call to the office staff.  Patient agreed to services and consent obtained.   Assessment: Review of patient past medical history, allergies, medications, health status, including review of consultants reports, laboratory and other test data, was performed as part of comprehensive evaluation and provision of chronic care management services.   SDOH (Social Determinants of Health) assessments and interventions performed:    Care Plan  Allergies  Allergen Reactions   Lactose Intolerance (Gi) Diarrhea   Apple Pectin [Pectin] Itching    ITCHY THROAT   Metformin And Related     D/t decreased kidney function    Peach [Prunus Persica] Itching    ITCHY THROAT   Morphine And Related Anxiety    Jittery    Outpatient Encounter Medications as of 09/11/2021  Medication Sig   amLODipine (NORVASC) 10 MG tablet Take 1 tablet (10 mg total) by mouth daily.   aspirin EC 81 MG tablet Take 81 mg by mouth daily.   atorvastatin (LIPITOR) 10 MG tablet Take 1 tablet (10 mg total) by mouth daily.    buPROPion (WELLBUTRIN SR) 150 MG 12 hr tablet TAKE ONE TABLET BY MOUTH EVERY MORNING   chlorthalidone (HYGROTON) 25 MG tablet Take 12.5 mg by mouth daily.   Continuous Blood Gluc Receiver (FREESTYLE LIBRE 14 DAY READER) DEVI Use with freestyle libre system   Continuous Blood Gluc Sensor (FREESTYLE LIBRE 14 DAY SENSOR) MISC Use with libre system   diclofenac Sodium (VOLTAREN) 1 % GEL Apply 4 g topically 4 (four) times daily.   gabapentin (NEURONTIN) 300 MG capsule TAKE ONE CAPSULE BY MOUTH EVERYDAY AT BEDTIME   glipiZIDE (GLUCOTROL XL) 2.5 MG 24 hr tablet TAKE ONE TABLET BY MOUTH EVERY MORNING and TAKE ONE TABLET BY MOUTH EVERY EVENING   insulin glargine (LANTUS SOLOSTAR) 100 UNIT/ML Solostar Pen INJECT 35 UNITS INTO THE SKIN DAILY. (Patient taking differently: Inject 10 Units into the skin daily.)   Insulin Pen Needle (B-D ULTRAFINE III SHORT PEN) 31G X 8 MM MISC USE WITH LANUTS PEN   losartan (COZAAR) 100 MG tablet Take 1 tablet (100 mg total) by mouth every morning.   metoprolol tartrate (LOPRESSOR) 50 MG tablet Take 1 tablet (50 mg total) by mouth 2 (two) times daily.   mometasone-formoterol (DULERA) 100-5 MCG/ACT AERO Inhale 2 puffs into the lungs 2 (two) times daily.   Spacer/Aero-Holding Chambers (AEROCHAMBER MAX W/FLOW-VU) MISC by Does not apply route. Use as directed   tiZANidine (ZANAFLEX) 2 MG tablet Take 1 tablet (2 mg total) by mouth at bedtime.   No facility-administered encounter medications on file as of 09/11/2021.    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)    CKD stage 2 due to type 2 diabetes mellitus ([place]) 02/12/2015   Right hemiparesis ([place])    Type 2 diabetes mellitus with diabetic neuropathy ([place]) 02/10/2015   Neck pain    Numbness and tingling 02/09/2015   Left shoulder pain 07/17/2012   Plantar fasciitis, bilateral 07/17/2012   Peripheral neuropathy 11/06/2007   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   Essential hypertension 07/08/2007   EXTRINSIC ASTHMA, UNSPECIFIED 06/25/2007    Conditions to be addressed/monitored: HTN, HLD, and DMII  Care Plan : RN Care Manager Plan of Care  Updates made by [name], RN since 09/11/2021 12:00 AM     Problem: Chronic Disease Management and Care Coordination Needs (DM2,HTN and HLD   Priority: High     Long-Range Goal: Establish Plan of Care for Chronic Disease Management Needs (DM2,HTN and HLD)   Start Date: 07/17/2021  Expected End Date: 03/10/2022  Recent Progress: On track  Priority: High  Note:   Current Barriers:  Knowledge Deficits related to plan of care for management of HTN, HLD, and DMII Chronic Disease Management support and education needs related to HTN, HLD, and DMII States his blood sugars have been good and he is scanning his Freestyle Libre 3 times a day ranges from 60-200 with his average of 150-159.  States he feels low around 60 in the early morning if he forgets to eat a bedtime snack.  States he is taking the lower dose of 10 units of Lantus daily now as his provider ordered  States his wife is preparing healthy meals and he is eating a snack at bedtime to avoid having a low during the night. States he has been walking about 3 days a week for about 20 minutes weather permitting States his B/P was 120/80 the last time he checked a few days ago and he checks his B/P 1-2 ties a week. States his chronic pain in his hand is unchanged   RNCM Clinical Goal(s):  Patient will verbalize understanding of plan  for management of HTN, HLD, and DMII verbalize basic understanding of  HTN, HLD, and DMII disease process and self health management plan . take all medications exactly as prescribed and will call provider for medication related questions attend all scheduled medical appointments: CCM Pharm D 2/24, [name] NP 12/19/21 demonstrate Improved adherence to prescribed treatment plan for HTN, HLD, and DMII as evidenced by readings within limits, adherence to plan of care continue to work with RN Care Manager to address care management and care coordination needs related to  HTN, HLD, and DMII through collaboration with RN Care manager, provider, and care team.   Interventions: 1:1 collaboration with primary care provider regarding development and update of comprehensive plan of care as evidenced by provider attestation and co-signature Inter-disciplinary care team collaboration (see longitudinal plan of care) Evaluation of current treatment plan related to  self management and patient's adherence to plan as established by provider     Hypertension Interventions:(Status:  Goal on track:  Yes.)  Long  Term Goal  Last practice recorded BP readings:  BP Readings from Last 3 Encounters:  06/08/21 (!) 170/90  05/16/21 (!) 180/90  03/10/21 (!) 150/80  Most recent eGFR/CrCl: No results found for: EGFR  No components found for: CRCL  Evaluation of current treatment plan related to hypertension self management and patient's adherence to plan as established by provider Provided education to patient re: stroke prevention, s/s of heart attack and stroke Reviewed medications with patient and discussed importance of compliance Counseled on the importance of exercise goals with target of 150 minutes per week Advised patient, providing education and rationale, to monitor blood pressure daily and record, calling PCP for findings outside established parameters Provided education on prescribed diet reinforced to  follow low sodium low CHO Reviewed to try to check B/P more regularly  Hyperlipidemia Interventions:(Status:  Goal on track:  Yes.)  Long Term Goal  Medication review performed; medication list updated in electronic medical record.  Provider established cholesterol goals reviewed Counseled on importance of regular laboratory monitoring as prescribed Reviewed importance of limiting foods high in cholesterol Reviewed exercise goals and target of 150 minutes per week  Diabetes Interventions:(Status:  Goal on track:  Yes.)  Long Term Goal Assessed patient's understanding of A1c goal: <7% Provided education to patient about basic DM disease process Reviewed medications with patient and discussed importance of medication adherence Counseled on importance of regular laboratory monitoring as prescribed Reviewed scheduled/upcoming provider appointments including: CCM PharmD 10/27/21, [name] NP 12/19/21 Advised patient, providing education and rationale, to check cbg scan 3 times a day and record, calling provider for findings outside established parameters Reviewed goals for time in range and how that effects his A1C value,  Reinforced s/sx of hypoglycemia and how to treat and importance to having bedtime snack Lab Results  Component Value Date   HGBA1C 6.4 (A) 06/08/2021  Pain Interventions:  (Status:  New goal.) Long Term Goal Pain assessment performed Medications reviewed Reviewed provider established plan for pain management Discussed importance of adherence to all scheduled medical appointments Counseled on the importance of reporting any/all new or changed pain symptoms or management strategies to pain management provider Discussed use of relaxation techniques and/or diversional activities to assist with pain reduction (distraction, imagery, relaxation, massage, acupressure, TENS, heat, and cold application Reviewed with patient prescribed pharmacological and nonpharmacological pain  relief strategies   Patient Goals/Self-Care Activities: Take all medications as prescribed Attend all scheduled provider appointments Call pharmacy for medication refills 3-7 days in advance of running out of medications Call provider office for new concerns or questions  keep appointment with eye doctor check blood sugar at prescribed times: three times daily and when you have symptoms of low or high blood sugar check feet daily for cuts, sores or redness trim toenails straight across drink 6 to 8 glasses of water each day fill half of plate with vegetables limit fast food meals to no more than 1 per week manage portion size switch to sugar-free drinks check blood pressure 3 times per week choose a place to take my blood pressure (home, clinic or office, retail store) call doctor for signs and symptoms of high blood pressure take medications for blood pressure exactly as prescribed eat more whole grains, fruits and vegetables, lean meats and healthy fats limit salt intake to 2354m/day call for medicine refill 2 or 3 days before it runs out take all medications exactly as prescribed call doctor with any symptoms you believe are related to your medicine Follow Up  Plan:  Telephone follow up appointment with care management team member scheduled for:  11/09/21 The patient has been provided with contact information for the care management team and has been advised to call with any health related questions or concerns.       Plan: Telephone follow up appointment with care management team member scheduled for:  11/09/21 The patient has been provided with contact information for the care management team and has been advised to call with any health related questions or concerns.  Peter Garter RN, Jackquline Denmark, CDE Care Management Coordinator [facility] [phone], Mobile [phone]

## 2021-09-18 DIAGNOSIS — N184 Chronic kidney disease, stage 4 (severe): Secondary | ICD-10-CM | POA: Diagnosis not present

## 2021-09-19 DIAGNOSIS — N184 Chronic kidney disease, stage 4 (severe): Secondary | ICD-10-CM | POA: Diagnosis not present

## 2021-09-25 ENCOUNTER — Telehealth: Payer: Self-pay | Admitting: Pharmacist

## 2021-09-25 DIAGNOSIS — E785 Hyperlipidemia, unspecified: Secondary | ICD-10-CM | POA: Diagnosis not present

## 2021-09-25 DIAGNOSIS — N184 Chronic kidney disease, stage 4 (severe): Secondary | ICD-10-CM | POA: Diagnosis not present

## 2021-09-25 DIAGNOSIS — E1122 Type 2 diabetes mellitus with diabetic chronic kidney disease: Secondary | ICD-10-CM | POA: Diagnosis not present

## 2021-09-25 DIAGNOSIS — I129 Hypertensive chronic kidney disease with stage 1 through stage 4 chronic kidney disease, or unspecified chronic kidney disease: Secondary | ICD-10-CM | POA: Diagnosis not present

## 2021-09-25 NOTE — Chronic Care Management (AMB) (Signed)
Chronic Care Management Pharmacy Assistant   Name: Russell Austin  MRN: [id number] DOB: 1951/03/22  Reason for Encounter: Medication Review  /Medication Coordination   Conditions to be addressed/monitored: DMII  Recent office visits:  09/11/21 [name] RN -  Patient presented for CCM Visit with Nurse  09/08/21 [name], NP - Patient presented for Type 2 diabetes wit diabetic neuropathy and current long term use of insulin.  Recent consult visits:  08/30/21 Sarajane Marek (Ophthalmology) - Patient presented for Anophthalmos of right eye and other concerns. No medication changes noted.  Hospital visits:  None in previous 6 months  Medications: Outpatient Encounter Medications as of 09/25/2021  Medication Sig   amLODipine (NORVASC) 10 MG tablet Take 1 tablet (10 mg total) by mouth daily.   aspirin EC 81 MG tablet Take 81 mg by mouth daily.   atorvastatin (LIPITOR) 10 MG tablet Take 1 tablet (10 mg total) by mouth daily.   buPROPion (WELLBUTRIN SR) 150 MG 12 hr tablet TAKE ONE TABLET BY MOUTH EVERY MORNING   chlorthalidone (HYGROTON) 25 MG tablet Take 12.5 mg by mouth daily.   Continuous Blood Gluc Receiver (FREESTYLE LIBRE 14 DAY READER) DEVI Use with freestyle libre system   Continuous Blood Gluc Sensor (FREESTYLE LIBRE 14 DAY SENSOR) MISC Use with libre system   diclofenac Sodium (VOLTAREN) 1 % GEL Apply 4 g topically 4 (four) times daily.   gabapentin (NEURONTIN) 300 MG capsule TAKE ONE CAPSULE BY MOUTH EVERYDAY AT BEDTIME   glipiZIDE (GLUCOTROL XL) 2.5 MG 24 hr tablet TAKE ONE TABLET BY MOUTH EVERY MORNING and TAKE ONE TABLET BY MOUTH EVERY EVENING   insulin glargine (LANTUS SOLOSTAR) 100 UNIT/ML Solostar Pen INJECT 35 UNITS INTO THE SKIN DAILY. (Patient taking differently: Inject 10 Units into the skin daily.)   Insulin Pen Needle (B-D ULTRAFINE III SHORT PEN) 31G X 8 MM MISC USE WITH LANUTS PEN   losartan (COZAAR) 100 MG tablet Take 1 tablet (100 mg total)  by mouth every morning.   metoprolol tartrate (LOPRESSOR) 50 MG tablet Take 1 tablet (50 mg total) by mouth 2 (two) times daily.   mometasone-formoterol (DULERA) 100-5 MCG/ACT AERO Inhale 2 puffs into the lungs 2 (two) times daily.   Spacer/Aero-Holding Chambers (AEROCHAMBER MAX W/FLOW-VU) MISC by Does not apply route. Use as directed   tiZANidine (ZANAFLEX) 2 MG tablet Take 1 tablet (2 mg total) by mouth at bedtime.   No facility-administered encounter medications on file as of 09/25/2021.  Recent Relevant Labs: Lab Results  Component Value Date/Time   HGBA1C 7.1 (A) 09/08/2021 07:12 AM   HGBA1C 6.4 (A) 06/08/2021 07:14 AM   HGBA1C 7.1 (H) 12/09/2020 08:04 AM   HGBA1C 7.3 (A) 02/11/2020 07:09 AM   HGBA1C 8.1 (H) 11/11/2019 07:44 AM   HGBA1C 6.6 07/23/2018 09:00 AM   MICROALBUR 50.8 (H) 12/13/2014 08:01 AM   MICROALBUR 27.7 (H) 12/16/2013 09:38 AM    Kidney Function Lab Results  Component Value Date/Time   CREATININE 3.82 (H) 12/09/2020 08:04 AM   CREATININE 2.07 (H) 11/11/2019 07:44 AM   GFR 15.40 (L) 12/09/2020 08:04 AM   GFRNONAA 41 (L) 10/28/2018 09:12 AM   GFRAA 47 (L) 10/28/2018 09:12 AM    Current antihyperglycemic regimen:  Insulin glargine (Lantus Solostar), inject 20 units into skin daily - not using it every day (not injecting for anything over 200) Glipizide ER 2.5 mg, 1 tablet in the morning and at bedtime (takes first thing) What recent interventions/DTPs have  been made to improve glycemic control:  Patient reports none Have there been any recent hospitalizations or ED visits since last visit with CPP? No Patient denies hypoglycemic symptoms, including None Patient denies hyperglycemic symptoms, including none How often are you checking your blood sugar? 3-4 times daily What are your blood sugars ranging?  Fasting: 150 159  During the week, how often does your blood glucose drop below 70?  Patient reports it is low around 60 of he does not have a snack the night  before  Adherence Review: Is the patient currently on a STATIN medication? Yes Is the patient currently on ACE/ARB medication? No Does the patient have >5 day gap between last estimated fill dates? No  Reviewed chart for medication changes ahead of medication coordination call.  No OVs, Consults, or hospital visits since last care coordination call/Pharmacist visit. (If appropriate, list visit date, provider name)  No medication changes indicated OR if recent visit, treatment plan here.  BP Readings from Last 3 Encounters:  09/08/21 120/80  06/08/21 (!) 170/90  05/16/21 (!) 180/90    Lab Results  Component Value Date   HGBA1C 7.1 (A) 09/08/2021     Patient obtains medications through Adherence Packaging  90 Days   Last adherence delivery included:  Losartan 100 mg - take one tablet every morning Chlorthalidone 25 mg - 1/2 tablet at breakfast (pt takes 1 qod) Gabapentin 300 mg -  take one capsule at bedtime Bupropion SR 150 mg - take one tablet every morning Glipizide ER 2.5 mg - take one tablet every morning and one tablet every evening Amlodipine 10 mg - take one tablet at breakfast Tizanidine 2 mg - take one tablet at bedtime Aspirin 81 mg - take one tablet at breakfast Atorvastatin 10 mg - take one tablet at breakfast Metoprolol Tartrate 50 mg - take one tablet every morning and one tablet every evening   Patient is due for next adherence delivery on: 10/05/21. Called patient and reviewed medications and coordinated delivery. Confirmed packaging for 90 DS  This delivery to include: Bupropion SR 150 mg - take one tablet every morning Metoprolol Tartrate 50 mg - take one tablet every morning and one tablet every evening Atorvastatin 10 mg - take one tablet at breakfast Amlodipine 10 mg - take one tablet at breakfast Gabapentin 300 mg -  take one capsule at bedtime Glipizide ER 2.5 mg - take one tablet every morning and one tablet every evening Tizanidine 2 mg - take one  tablet at bedtime Losartan 100 mg - take one tablet every morning Aspirin 81 mg - take one tablet at breakfast Chlorthalidone 25 mg - 1/2 tablet at breakfast   Patient requested to add: [employer]  B-D Ultrafine short Pen needles  Confirmed delivery date of 10/05/21, advised patient that pharmacy will contact them the morning of delivery.   Care Gaps: Zoster Vaccine - Overdue COVID Booster #4 [employer]) - Overdue AWV - Done 01-11-2021 CCM - 10/27/21 BP - 120/80 ( 09/08/21) Lab Results  Component Value Date   HGBA1C 7.1 (A) 09/08/2021    Star Rating Drugs: Losartan 100 mg - Last filled 06/30/21 90 DS at [facility] Atorvastatin 10 mg - Last filled 07/01/2021 90 DS at [facility] Glipizide 2.5 mg - Last filled 06/29/2021 90 DS at [facility]  Patient Assistance: None   [place] Clinical Pharmacist Assistant [phone]

## 2021-09-26 ENCOUNTER — Ambulatory Visit (INDEPENDENT_AMBULATORY_CARE_PROVIDER_SITE_OTHER): Payer: HMO | Admitting: Podiatry

## 2021-09-26 ENCOUNTER — Other Ambulatory Visit: Payer: Self-pay

## 2021-09-26 ENCOUNTER — Encounter: Payer: Self-pay | Admitting: Podiatry

## 2021-09-26 DIAGNOSIS — L84 Corns and callosities: Secondary | ICD-10-CM | POA: Diagnosis not present

## 2021-09-26 DIAGNOSIS — E1140 Type 2 diabetes mellitus with diabetic neuropathy, unspecified: Secondary | ICD-10-CM | POA: Diagnosis not present

## 2021-09-26 DIAGNOSIS — M79675 Pain in left toe(s): Secondary | ICD-10-CM

## 2021-09-26 DIAGNOSIS — M79674 Pain in right toe(s): Secondary | ICD-10-CM | POA: Diagnosis not present

## 2021-09-26 DIAGNOSIS — Z794 Long term (current) use of insulin: Secondary | ICD-10-CM

## 2021-09-26 DIAGNOSIS — B351 Tinea unguium: Secondary | ICD-10-CM | POA: Diagnosis not present

## 2021-10-02 ENCOUNTER — Other Ambulatory Visit: Payer: Self-pay | Admitting: Adult Health

## 2021-10-02 DIAGNOSIS — E1140 Type 2 diabetes mellitus with diabetic neuropathy, unspecified: Secondary | ICD-10-CM

## 2021-10-02 DIAGNOSIS — G8191 Hemiplegia, unspecified affecting right dominant side: Secondary | ICD-10-CM

## 2021-10-03 DIAGNOSIS — E1140 Type 2 diabetes mellitus with diabetic neuropathy, unspecified: Secondary | ICD-10-CM

## 2021-10-03 DIAGNOSIS — I10 Essential (primary) hypertension: Secondary | ICD-10-CM

## 2021-10-03 DIAGNOSIS — Z794 Long term (current) use of insulin: Secondary | ICD-10-CM

## 2021-10-03 DIAGNOSIS — E785 Hyperlipidemia, unspecified: Secondary | ICD-10-CM | POA: Diagnosis not present

## 2021-10-03 NOTE — Progress Notes (Signed)
°  Subjective:  Patient ID: Russell Austin, male    DOB: 01-15-1951,  MRN: [id number]  Russell Austin presents to clinic today for at risk foot care with history of diabetic neuropathy and callus(es) left foot and painful thick toenails that are difficult to trim. Painful toenails interfere with ambulation. Aggravating factors include wearing enclosed shoe gear. Pain is relieved with periodic professional debridement. Painful calluses are aggravated when weightbearing with and without shoegear. Pain is relieved with periodic professional debridement.  Patient states blood glucose was 77 mg/dl today.    New problem(s): None.   PCP is [name], NP , and last visit was 01/06/20223.  Allergies  Allergen Reactions   Lactose Intolerance (Gi) Diarrhea   Apple Pectin [Pectin] Itching    ITCHY THROAT   Metformin And Related     D/t decreased kidney function    Peach [Prunus Persica] Itching    ITCHY THROAT   Morphine And Related Anxiety    Jittery    Review of Systems: Negative except as noted in the HPI. Objective:   Constitutional Russell Austin is a pleasant 71 y.o. African American male, in NAD. AAO x 3.   Vascular Capillary refill time to digits immediate b/l. Palpable DP pulse(s) b/l LE. Faintly palpable PT pulse(s) b/l LE. Pedal hair present. Lower extremity skin temperature gradient within normal limits. +1 pitting edema noted BLE. No cyanosis or clubbing noted b/l LE.  Neurologic Normal speech. Oriented to person, place, and time. Pt has subjective symptoms of neuropathy. Protective sensation intact 5/5 intact bilaterally with 10g monofilament b/l.  Dermatologic No open wounds b/l LE. No interdigital macerations noted b/l LE. Toenails 1-5 right and 1-4 left elongated, discolored, dystrophic, thickened, and crumbly with subungual debris and tenderness to dorsal palpation. Anonychia noted L 5th toe. Nailbed(s) epithelialized.  Hyperkeratotic lesion(s) plantar heel pad  of left foot and submet head 5 left foot.  No erythema, no edema, no drainage, no fluctuance.  Orthopedic: Muscle strength 5/5 to all lower extremity muscle groups bilaterally. HAV with bunion deformity noted b/l LE. Pes planus deformity noted bilateral LE.   Radiographs: None  Last A1c:  Hemoglobin A1C Latest Ref Rng & Units 09/08/2021 06/08/2021 03/10/2021 12/09/2020  HGBA1C 4.0 - 5.6 % 7.1(A) 6.4(A) 7.5(A) 7.1(H)  Some recent data might be hidden   Assessment:   1. Pain due to onychomycosis of toenails of both feet   2. Callus   3. Type 2 diabetes mellitus with diabetic neuropathy, with long-term current use of insulin ([place])    Plan:  Patient was evaluated and treated and all questions answered. Consent given for treatment as described below: -Examined patient. -No new findings. No new orders. -Continue foot and shoe inspections daily. Monitor blood glucose per PCP/Endocrinologist's recommendations. -Mycotic toenails 1-5 right and 1-4 left were debrided in length and girth with sterile nail nippers and dremel without iatrogenic bleeding. -Callus(es) plantar heel pad of left foot and submet head 5 left foot pared utilizing sterile scalpel blade without complication or incident. Total number debrided =2. -Patient/POA to call should there be question/concern in the interim.  Return in about 3 months (around 12/25/2021).  Marzetta Board, DPM

## 2021-10-06 DIAGNOSIS — Z4420 Encounter for fitting and adjustment of artificial eye, unspecified: Secondary | ICD-10-CM | POA: Diagnosis not present

## 2021-10-25 ENCOUNTER — Encounter: Payer: Self-pay | Admitting: Family Medicine

## 2021-10-25 ENCOUNTER — Ambulatory Visit (INDEPENDENT_AMBULATORY_CARE_PROVIDER_SITE_OTHER): Payer: HMO | Admitting: Family Medicine

## 2021-10-25 ENCOUNTER — Telehealth: Payer: Self-pay | Admitting: Pharmacist

## 2021-10-25 VITALS — BP 150/78 | HR 68 | Temp 97.7°F | Ht 69.0 in | Wt 187.9 lb

## 2021-10-25 DIAGNOSIS — M546 Pain in thoracic spine: Secondary | ICD-10-CM | POA: Diagnosis not present

## 2021-10-25 NOTE — Progress Notes (Signed)
Established Patient Office Visit  Subjective:  Patient ID: Russell Austin, male    DOB: April 15, 1951  Age: 71 y.o. MRN: [id number]  CC:  Chief Complaint  Patient presents with   Acute Visit    Rt side pain X 2 weeks    HPI Russell Austin presents for pain somewhat poorly localized right back mostly thoracic region.  This is actually almost upper lumbar area radiating toward the scapula.  He states this is particularly worse after he sits for prolonged periods and first goes to get up.  He feels like the muscles stiffen up.  Denies any fever, cough, dyspnea, skin rash.  No thoracic spinal pain.  He takes Zanaflex 2 mg nightly at baseline.  He has had previous stroke with right hemiparesis.  He has some spasticity related to that.  Denies any recent fall or injury.  No urinary symptoms.  No abdominal pain.  He states his home blood pressures have generally been well controlled.  Up slightly today.  He did eat some chips about an hour before he got here.  He does have home cuff to monitor this.  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   Diabetes mellitus type II    Diabetic retinopathy FOLLOWED BY DR Zadie Rhine   ED (erectile dysfunction)    Glaucoma of both eyes    Hypertension    Left hydrocele    Stroke [facility])     Past Surgical History:  Procedure Laterality Date   APPENDECTOMY  AGE EARLY 20'S   COLONOSCOPY  12/30/2020   every 5 years   HYDROCELE EXCISION  03/31/2012   Procedure: HYDROCELECTOMY ADULT;  Surgeon: Franchot Gallo, MD;  Location: [facility];  Service: Urology;  Laterality: Left;  92 MINS     LEFT EYE LASER RETINA REPAIR  SEPT 2012   RIGHT EYE VITRECTOMY/ INSERTION GLAUCOMA SETON/ LASER REPAIR  12-13-2008   RETINAL ARTERY OCCLUSION /NEOVASCULAR GLAUCOMA/ HEMORRHAGE   RIGHT EYE VITRETOMY/ INSERTION GLAUCOMA SETON X2/ LASER  03-24-2009   RECURRENT HEMORRHAGE/ OCCLUSION INTERNAL SETON    SHOULDER ARTHROSCOPY Right 2005   undescended right testicle removed  1994    Family History  Problem Relation Age of Onset   Glaucoma Mother    Diabetes Mother    Stomach cancer Maternal Grandfather    Stroke Maternal Grandmother     Social History   Socioeconomic History   Marital status: Married    Spouse name: Not on file   Number of children: 1   Years of education: 12   Highest education level: Not on file  Occupational History   Occupation: Disabled  Tobacco Use   Smoking status: Never   Smokeless tobacco: Never  Substance and Sexual Activity   Alcohol use: No   Drug use: No   Sexual activity: Not on file  Other Topics Concern   Not on file  Social History Narrative   Lives at home with his wife and granddaughter.   Left-handed.   3 cups caffeine per day.   Social Determinants of Health   Financial Resource Strain: Low Risk    Difficulty of Paying Living Expenses: Not hard at all  Food Insecurity: No Food Insecurity   Worried About Charity fundraiser in the Last Year: Never true   [place] in the Last Year: Never true  Transportation Needs: No Transportation Needs   Lack of Transportation (Medical): No  Lack of Transportation (Non-Medical): No  Physical Activity: Inactive   Days of Exercise per Week: 0 days   Minutes of Exercise per Session: 0 min  Stress: No Stress Concern Present   Feeling of Stress : Not at all  Social Connections: Socially Integrated   Frequency of Communication with Friends and Family: More than three times a week   Frequency of Social Gatherings with Friends and Family: Twice a week   Attends Religious Services: More than 4 times per year   Active Member of [employer] or Organizations: Yes   Attends Archivist Meetings: 1 to 4 times per year   Marital Status: Married  Human resources officer Violence: Not At Risk   Fear of Current or Ex-Partner: No   Emotionally Abused: No   Physically Abused: No   Sexually Abused: No     Outpatient Medications Prior to Visit  Medication Sig Dispense Refill   amLODipine (NORVASC) 10 MG tablet Take 1 tablet (10 mg total) by mouth daily. 90 tablet 2   aspirin EC 81 MG tablet Take 81 mg by mouth daily.     atorvastatin (LIPITOR) 10 MG tablet Take 1 tablet (10 mg total) by mouth daily. 90 tablet 3   buPROPion (WELLBUTRIN SR) 150 MG 12 hr tablet TAKE ONE TABLET BY MOUTH EVERY MORNING 90 tablet 0   chlorthalidone (HYGROTON) 25 MG tablet Take 12.5 mg by mouth daily.     Continuous Blood Gluc Receiver (FREESTYLE LIBRE 14 DAY READER) DEVI Use with freestyle libre system 1 each 0   Continuous Blood Gluc Sensor (FREESTYLE LIBRE 14 DAY SENSOR) MISC Use with libre system 6 each 3   diclofenac Sodium (VOLTAREN) 1 % GEL Apply 4 g topically 4 (four) times daily. 350 g 0   gabapentin (NEURONTIN) 300 MG capsule TAKE ONE CAPSULE BY MOUTH EVERYDAY AT BEDTIME 90 capsule 0   glipiZIDE (GLUCOTROL XL) 2.5 MG 24 hr tablet TAKE ONE TABLET BY MOUTH EVERY MORNING and TAKE ONE TABLET BY MOUTH EVERY EVENING 180 tablet 0   insulin glargine (LANTUS SOLOSTAR) 100 UNIT/ML Solostar Pen INJECT 35 UNITS INTO THE SKIN DAILY. (Patient taking differently: Inject 10 Units into the skin daily.) 15 mL 1   Insulin Pen Needle (B-D ULTRAFINE III SHORT PEN) 31G X 8 MM MISC USE WITH LANUTS PEN 100 each 3   losartan (COZAAR) 100 MG tablet Take 1 tablet (100 mg total) by mouth every morning. 90 tablet 1   metoprolol tartrate (LOPRESSOR) 50 MG tablet Take 1 tablet (50 mg total) by mouth 2 (two) times daily. 180 tablet 3   mometasone-formoterol (DULERA) 100-5 MCG/ACT AERO Inhale 2 puffs into the lungs 2 (two) times daily.     Spacer/Aero-Holding Chambers (AEROCHAMBER MAX W/FLOW-VU) MISC by Does not apply route. Use as directed     tiZANidine (ZANAFLEX) 2 MG tablet TAKE ONE TABLET BY MOUTH EVERYDAY AT BEDTIME 90 tablet 1   No facility-administered medications prior to visit.    Allergies  Allergen Reactions   Lactose  Intolerance (Gi) Diarrhea   Apple Pectin [Pectin] Itching    ITCHY THROAT   Metformin And Related     D/t decreased kidney function    Peach [Prunus Persica] Itching    ITCHY THROAT   Morphine And Related Anxiety    Jittery    ROS Review of Systems  Constitutional:  Negative for chills, fever and unexpected weight change.  Gastrointestinal:  Negative for abdominal pain.  Genitourinary:  Negative for dysuria.  Musculoskeletal:  Positive for back pain.  Skin:  Negative for rash.     Objective:    Physical Exam Vitals reviewed.  Cardiovascular:     Rate and Rhythm: Normal rate and regular rhythm.  Pulmonary:     Effort: Pulmonary effort is normal.     Breath sounds: Normal breath sounds.  Abdominal:     Palpations: Abdomen is soft. There is no mass.     Tenderness: There is no abdominal tenderness. There is no guarding.  Musculoskeletal:     Comments: No spinal tenderness   he has some mild muscular tenderness just inferior to the scapular region.  No visible redness.  No swelling.  No ecchymosis.  Skin:    Findings: No rash.     Comments: No visible rash.  Neurological:     Mental Status: He is alert.    BP (!) 150/78 (BP Location: Left Arm, Patient Position: Sitting, Cuff Size: Normal)    Pulse 68    Temp 97.7 F (36.5 C) (Oral)    Ht 5\' 9"  (1.753 m)    Wt 187 lb 14.4 oz (85.2 kg)    SpO2 99%    BMI 27.75 kg/m  Wt Readings from Last 3 Encounters:  10/25/21 187 lb 14.4 oz (85.2 kg)  09/08/21 184 lb (83.5 kg)  06/08/21 185 lb (83.9 kg)     Health Maintenance Due  Topic Date Due   Zoster Vaccines- Shingrix (1 of 2) Never done   COVID-19 Vaccine (5 - Booster for Pfizer series) 08/09/2021    There are no preventive care reminders to display for this patient.  Lab Results  Component Value Date   TSH 2.03 12/09/2020   Lab Results  Component Value Date   WBC 3.7 (L) 12/09/2020   HGB 12.2 (L) 12/09/2020   HCT 37.0 (L) 12/09/2020   MCV 89.5 12/09/2020   PLT  258.0 12/09/2020   Lab Results  Component Value Date   NA 136 12/09/2020   K 4.2 12/09/2020   CO2 26 12/09/2020   GLUCOSE 95 12/09/2020   BUN 53 (H) 12/09/2020   CREATININE 3.82 (H) 12/09/2020   BILITOT 1.0 12/09/2020   ALKPHOS 135 (H) 12/09/2020   AST 17 12/09/2020   ALT 16 12/09/2020   PROT 6.3 12/09/2020   ALBUMIN 3.7 12/09/2020   CALCIUM 8.6 12/09/2020   ANIONGAP 7 10/28/2018   GFR 15.40 (L) 12/09/2020   Lab Results  Component Value Date   CHOL 98 12/09/2020   Lab Results  Component Value Date   HDL 30.20 (L) 12/09/2020   Lab Results  Component Value Date   LDLCALC 48 12/09/2020   Lab Results  Component Value Date   TRIG 101.0 12/09/2020   Lab Results  Component Value Date   CHOLHDL 3 12/09/2020   Lab Results  Component Value Date   HGBA1C 7.1 (A) 09/08/2021      Assessment & Plan:   2-week history of somewhat poorly localized right thoracic and upper lumbar pain.  Suspect musculoskeletal.  He has sensation of spasticity of muscles at times and states he has had worse symptoms since his stroke.  -We discussed conservative measures such as heat and topical sports creams.  He will get his wife to massage this into the muscles in this region. -Continue Zanaflex at night.  Could double up to 4 mg nightly but use caution with his age -If symptoms not improving over the next week or 2 consider physical therapy  Blood pressure was  also slightly up today but he had eaten some salt earlier today.  Continue close home monitoring.  Goal less than 130/80 with prior stroke.  He has follow-up with primary early April  No orders of the defined types were placed in this encounter.   Follow-up: No follow-ups on file.    Carolann Littler, MD

## 2021-10-25 NOTE — Chronic Care Management (AMB) (Signed)
° ° °  Chronic Care Management Pharmacy Assistant   Name: Russell Austin  MRN: [id number] DOB: 29-Apr-1951  10/25/21 APPOINTMENT REMINDER   Called Patient No answer, left message of appointment on 10/25/21 at 3:45 via telephone visit with [name], Pharm D.   Notified to have all medications, supplements, blood pressure and/or blood sugar logs available during appointment and to return call if need to reschedule.     Care Gaps: Zoster Vaccines - Overdue COVID Booster - Overdue BP- 120/80 (09/08/21) AWV- 5/22 Lab Results  Component Value Date   HGBA1C 7.1 (A) 09/08/2021    Star Rating Drug: Losartan 100 mg - Last filled 06/30/21 90 DS at [facility] Atorvastatin 10 mg - Last filled 07/01/2021 90 DS at [facility] Glipizide 2.5 mg - Last filled 06/29/2021 90 DS at [facility]  Any gaps in medications fill history?      Medications: Outpatient Encounter Medications as of 10/25/2021  Medication Sig   amLODipine (NORVASC) 10 MG tablet Take 1 tablet (10 mg total) by mouth daily.   aspirin EC 81 MG tablet Take 81 mg by mouth daily.   atorvastatin (LIPITOR) 10 MG tablet Take 1 tablet (10 mg total) by mouth daily.   buPROPion (WELLBUTRIN SR) 150 MG 12 hr tablet TAKE ONE TABLET BY MOUTH EVERY MORNING   chlorthalidone (HYGROTON) 25 MG tablet Take 12.5 mg by mouth daily.   Continuous Blood Gluc Receiver (FREESTYLE LIBRE 14 DAY READER) DEVI Use with freestyle libre system   Continuous Blood Gluc Sensor (FREESTYLE LIBRE 14 DAY SENSOR) MISC Use with libre system   diclofenac Sodium (VOLTAREN) 1 % GEL Apply 4 g topically 4 (four) times daily.   gabapentin (NEURONTIN) 300 MG capsule TAKE ONE CAPSULE BY MOUTH EVERYDAY AT BEDTIME   glipiZIDE (GLUCOTROL XL) 2.5 MG 24 hr tablet TAKE ONE TABLET BY MOUTH EVERY MORNING and TAKE ONE TABLET BY MOUTH EVERY EVENING   insulin glargine (LANTUS SOLOSTAR) 100 UNIT/ML Solostar Pen INJECT 35 UNITS INTO THE SKIN DAILY. (Patient taking differently: Inject 10  Units into the skin daily.)   Insulin Pen Needle (B-D ULTRAFINE III SHORT PEN) 31G X 8 MM MISC USE WITH LANUTS PEN   losartan (COZAAR) 100 MG tablet Take 1 tablet (100 mg total) by mouth every morning.   metoprolol tartrate (LOPRESSOR) 50 MG tablet Take 1 tablet (50 mg total) by mouth 2 (two) times daily.   mometasone-formoterol (DULERA) 100-5 MCG/ACT AERO Inhale 2 puffs into the lungs 2 (two) times daily.   Spacer/Aero-Holding Chambers (AEROCHAMBER MAX W/FLOW-VU) MISC by Does not apply route. Use as directed   tiZANidine (ZANAFLEX) 2 MG tablet TAKE ONE TABLET BY MOUTH EVERYDAY AT BEDTIME   No facility-administered encounter medications on file as of 10/25/2021.     [place] Clinical Pharmacist Assistant [phone]

## 2021-10-25 NOTE — Patient Instructions (Signed)
May increase the Zanaflex to two at night if needed-   but watch for sedation  Heat may also help  Also consider topical sports cream such as Biofreeze.

## 2021-10-26 ENCOUNTER — Ambulatory Visit: Payer: HMO | Admitting: Adult Health

## 2021-10-27 ENCOUNTER — Ambulatory Visit (INDEPENDENT_AMBULATORY_CARE_PROVIDER_SITE_OTHER): Payer: HMO | Admitting: Pharmacist

## 2021-10-27 DIAGNOSIS — Z794 Long term (current) use of insulin: Secondary | ICD-10-CM

## 2021-10-27 DIAGNOSIS — I10 Essential (primary) hypertension: Secondary | ICD-10-CM

## 2021-10-27 DIAGNOSIS — E1140 Type 2 diabetes mellitus with diabetic neuropathy, unspecified: Secondary | ICD-10-CM

## 2021-10-27 NOTE — Progress Notes (Signed)
Chronic Care Management Pharmacy Note  10/30/2021 Name:  Russell Austin MRN:  [id number] DOB:  March 27, 1951  Summary: BP not at goal < 130/80 per home readings A1c not at goal < 7%  Recommendations/Changes made from today's visit: -Recommended bringing BP cuff to next office visit to ensure accuracy -Recommended daily or every other day BP monitoring at home -Recommended switching to [employer] 3  to manage blood sugars easier/virtually  Plan: BP follow up in 2-3 weeks Follow up in 4 months  Subjective: Russell Austin is an 71 y.o. year old male who is a primary patient of Russell Peng, NP.  The CCM team was consulted for assistance with disease management and care coordination needs.    Engaged with patient by telephone for follow up visit in response to provider referral for pharmacy case management and/or care coordination services.   Consent to Services:  The patient was given information about Chronic Care Management services, agreed to services, and gave verbal consent prior to initiation of services.  Please see initial visit note for detailed documentation.   Patient Care Team: Russell Peng, NP as PCP - General (Family Medicine) Russell Austin, Russell Grinder, MD as Consulting Physician (Allergy and Immunology) [facility] (Ophthalmology) Russell Cork, MD (Ophthalmology) Russell Austin Russell Faster, MD as Consulting Physician (Neurosurgery) Russell Austin Russell Pollack, MD as Consulting Physician (Nephrology) [facility] (Plastic Surgery) Russell Austin, [facility] as Pharmacist (Pharmacist) Russell Ped, RN as Case Manager  Recent office visits: 10/25/21 Russell Post, MD: Patient presented for back pain. Consider doubling up on Zanaflex but use with caution. Consider PT if symptoms do not improve.  09/11/21 Russell Ped RN -  Patient presented for CCM Visit with Nurse   09/08/21 Russell Peng, NP - Patient  presented for Type 2 diabetes wit diabetic neuropathy and current long term use of insulin.  06/08/2021 Russell Peng NP (PCP) - Patient was seen for Type 2 diabetes mellitus with diabetic neuropathy and additional issues. Influenza vaccine administered. A1c decreased to 6.4%. No medication changes. Follow up in 3 months.  05-16-2021 Burchette, Russell Sierras, MD - Patient presented for Type 2 diabetes mellitus with diabetic neuropathy, with long-term current use of insulin and other concerns. No medication changes.  Recent consult visits: 09/26/21 Russell Austin, DPM (podiatry): Patient presented for nail trim.  08/30/21 Russell Austin (Ophthalmology) - Patient presented for Anophthalmos of right eye and other concerns. No medication changes noted.  07/12/21 Russell Austin, DPM (podiatry): Patient presented to obtain diabetic shoes.  06/19/21 Russell Austin, DPM (podiatry): Patient presented for nail trim.  Hospital visits: None in previous 6 months   Objective:  Lab Results  Component Value Date   CREATININE 3.82 (H) 12/09/2020   BUN 53 (H) 12/09/2020   GFR 15.40 (L) 12/09/2020   GFRNONAA 41 (L) 10/28/2018   GFRAA 47 (L) 10/28/2018   NA 136 12/09/2020   K 4.2 12/09/2020   CALCIUM 8.6 12/09/2020   CO2 26 12/09/2020   GLUCOSE 95 12/09/2020    Lab Results  Component Value Date/Time   HGBA1C 7.1 (A) 09/08/2021 07:12 AM   HGBA1C 6.4 (A) 06/08/2021 07:14 AM   HGBA1C 7.1 (H) 12/09/2020 08:04 AM   HGBA1C 7.3 (A) 02/11/2020 07:09 AM   HGBA1C 8.1 (H) 11/11/2019 07:44 AM   HGBA1C 6.6 07/23/2018 09:00 AM   GFR 15.40 (L) 12/09/2020 08:04 AM   GFR 38.81 (L) 11/11/2019 07:44 AM   MICROALBUR 50.8 (H) 12/13/2014 08:01 AM  MICROALBUR 27.7 (H) 12/16/2013 09:38 AM    Last diabetic Eye exam:  Lab Results  Component Value Date/Time   HMDIABEYEEXA Retinopathy (A) 04/17/2021 12:00 AM    Last diabetic Foot exam:  Lab Results  Component Value Date/Time   HMDIABFOOTEX done 02/02/2010  12:00 AM     Lab Results  Component Value Date   CHOL 98 12/09/2020   HDL 30.20 (L) 12/09/2020   LDLCALC 48 12/09/2020   TRIG 101.0 12/09/2020   CHOLHDL 3 12/09/2020    Hepatic Function Latest Ref Rng & Units 12/09/2020 11/11/2019 10/15/2018  Total Protein 6.0 - 8.3 g/dL 6.3 5.8(L) 6.6  Albumin 3.5 - 5.2 g/dL 3.7 3.4(L) 3.7  AST 0 - 37 U/L 17 16 14   ALT 0 - 53 U/L 16 17 12   Alk Phosphatase 39 - 117 U/L 135(H) 110 100  Total Bilirubin 0.2 - 1.2 mg/dL 1.0 1.0 1.0  Bilirubin, Direct 0.0 - 0.3 mg/dL - - -    Lab Results  Component Value Date/Time   TSH 2.03 12/09/2020 08:04 AM   TSH 3.47 11/11/2019 07:44 AM    CBC Latest Ref Rng & Units 12/09/2020 11/11/2019 10/28/2018  WBC 4.0 - 10.5 K/uL 3.7(L) 4.2 6.7  Hemoglobin 13.0 - 17.0 g/dL 12.2(L) 11.7(L) 10.0(L)  Hematocrit 39.0 - 52.0 % 37.0(L) 34.9(L) 32.0(L)  Platelets 150.0 - 400.0 K/uL 258.0 270.0 318    No results found for: VD25OH  Clinical ASCVD: Yes  The ASCVD Risk score (Arnett DK, et al., 2019) failed to calculate for the following reasons:   The valid total cholesterol range is 130 to 320 mg/dL    Depression screen [facility] 2/9 09/08/2021 07/17/2021 01/11/2021  Decreased Interest 0 0 0  Down, Depressed, Hopeless 0 0 0  PHQ - 2 Score 0 0 0  Altered sleeping - - -  Tired, decreased energy - - -  Change in appetite - - -  Feeling bad or failure about yourself  - - -  Trouble concentrating - - -  Moving slowly or fidgety/restless - - -  Suicidal thoughts - - -  PHQ-9 Score - - -  Difficult doing work/chores - - -  Some recent data might be hidden      Social History   Tobacco Use  Smoking Status Never  Smokeless Tobacco Never   BP Readings from Last 3 Encounters:  10/25/21 (!) 150/78  09/08/21 120/80  06/08/21 (!) 170/90   Pulse Readings from Last 3 Encounters:  10/25/21 68  09/08/21 (!) 59  06/08/21 80   Wt Readings from Last 3 Encounters:  10/25/21 187 lb 14.4 oz (85.2 kg)  09/08/21 184 lb (83.5 kg)   06/08/21 185 lb (83.9 kg)   BMI Readings from Last 3 Encounters:  10/25/21 27.75 kg/m  09/08/21 28.82 kg/m  06/08/21 28.98 kg/m    Assessment/Interventions: Review of patient past medical history, allergies, medications, health status, including review of consultants reports, laboratory and other test data, was performed as part of comprehensive evaluation and provision of chronic care management services.   SDOH:  (Social Determinants of Health) assessments and interventions performed: No  SDOH Screenings   Alcohol Screen: Not on file  Depression (PHQ2-9): Low Risk    PHQ-2 Score: 0  Financial Resource Strain: Low Risk    Difficulty of Paying Living Expenses: Not hard at all  Food Insecurity: No Food Insecurity   Worried About Charity fundraiser in the Last Year: Never true   Ran Out of  Food in the Last Year: Never true  Housing: Low Risk    Last Housing Risk Score: 0  Physical Activity: Inactive   Days of Exercise per Week: 0 days   Minutes of Exercise per Session: 0 min  Social Connections: Socially Integrated   Frequency of Communication with Friends and Family: More than three times a week   Frequency of Social Gatherings with Friends and Family: Twice a week   Attends Religious Services: More than 4 times per year   Active Member of [employer] or Organizations: Yes   Attends Archivist Meetings: 1 to 4 times per year   Marital Status: Married  Stress: No Stress Concern Present   Feeling of Stress : Not at all  Tobacco Use: Low Risk    Smoking Tobacco Use: Never   Smokeless Tobacco Use: Never   Passive Exposure: Not on file  Transportation Needs: No Transportation Needs   Lack of Transportation (Medical): No   Lack of Transportation (Non-Medical): No    CCM Care Plan  Allergies  Allergen Reactions   Lactose Intolerance (Gi) Diarrhea   Apple Pectin [Pectin] Itching    ITCHY THROAT   Metformin And Related     D/t decreased kidney function    Peach  [Prunus Persica] Itching    ITCHY THROAT   Morphine And Related Anxiety    Jittery    Medications Reviewed Today     Reviewed by Russell Austin, RPH (Pharmacist) on 10/27/21 at 1224  Med List Status: <None>   Medication Order Taking? Sig Documenting Provider Last Dose Status Informant  amLODipine (NORVASC) 10 MG tablet [id number] No Take 1 tablet (10 mg total) by mouth daily. Nafziger, [name], NP Taking Active   aspirin EC 81 MG tablet [id number] No Take 81 mg by mouth daily. [provider] Taking Active   atorvastatin (LIPITOR) 10 MG tablet [id number] No Take 1 tablet (10 mg total) by mouth daily. Nafziger, [name], NP Taking Active   buPROPion [facility] SR) 150 MG 12 hr tablet [id number] No TAKE ONE TABLET BY MOUTH EVERY MORNING Nafziger, [name], NP Taking Active   chlorthalidone (HYGROTON) 25 MG tablet [id number] No Take 12.5 mg by mouth daily. [provider] Taking Active   Continuous Blood Gluc Receiver (FREESTYLE LIBRE 14 DAY READER) [place] [id number] No Use with freestyle libre system [place], [place], NP Taking Active   Continuous Blood Gluc Sensor (FREESTYLE LIBRE 14 DAY SENSOR) [place] [id number] No Use with libre system Nafziger, [place], NP Taking Active   diclofenac Sodium (VOLTAREN) 1 % GEL [id number] No Apply 4 g topically 4 (four) times daily. [place], Betty G, MD Taking Active   gabapentin (NEURONTIN) 300 MG capsule [id number] No TAKE ONE CAPSULE BY MOUTH EVERYDAY AT BEDTIME Nafziger, [name], NP Taking Active   glipiZIDE (GLUCOTROL XL) 2.5 MG 24 hr tablet [id number] No TAKE ONE TABLET BY MOUTH EVERY MORNING and TAKE ONE TABLET BY MOUTH EVERY EVENING Nafziger, [name], NP Taking Active   insulin glargine (LANTUS SOLOSTAR) 100 UNIT/ML Solostar Pen [id number] No INJECT 35 UNITS INTO THE SKIN DAILY.  Patient taking differently: Inject 10 Units into the skin daily.   Nafziger, [name], NP Taking Active   Insulin Pen Needle (B-D ULTRAFINE III SHORT PEN) 31G X 8 MM MISC [id number] No USE WITH  LANUTS PEN Nafziger, [name], NP Taking Active   losartan (COZAAR) 100 MG tablet [id number] No Take 1 tablet (100 mg total) by mouth every morning. Russell Peng, NP Taking Active   metoprolol tartrate (  LOPRESSOR) 50 MG tablet [id number] No Take 1 tablet (50 mg total) by mouth 2 (two) times daily. Nafziger, [name], NP Taking Active   mometasone-formoterol [facility]) 100-5 MCG/ACT Hollie Salk [id number] No Inhale 2 puffs into the lungs 2 (two) times daily. Russell Austin, Russell Grinder, MD Taking Active   Spacer/Aero-Holding Chambers (AEROCHAMBER MAX W/FLOW-VU) MISC [id number] No by Does not apply route. Use as directed Russell Austin, Russell Grinder, MD Taking Active   tiZANidine (ZANAFLEX) 2 MG tablet [id number] No TAKE ONE TABLET BY MOUTH EVERYDAY AT BEDTIME Russell Peng, NP Taking Active             Patient Active Problem List   Diagnosis Date Noted   Diabetes mellitus ([place]) 07/14/2020   Hyperlipidemia 07/14/2020   Osteoarthritis 07/14/2020   Plantar fasciitis 07/14/2020   Stasis edema of both lower extremities 01/01/2019   Primary osteoarthritis of left shoulder 03/20/2018   Spondylosis of 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 myelopathy or radiculopathy 03/20/2018   Complex regional pain syndrome I of upper limb 06/27/2015   Dysphagia following cerebral infarction 02/15/2015   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)    CKD stage 2 due to type 2 diabetes mellitus ([place]) 02/12/2015   Right hemiparesis ([place])    Type 2 diabetes mellitus with diabetic neuropathy ([place]) 02/10/2015   Neck pain    Numbness and tingling 02/09/2015   Left shoulder pain 07/17/2012   Plantar fasciitis, bilateral 07/17/2012   Peripheral neuropathy 11/06/2007   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   Essential hypertension 07/08/2007   EXTRINSIC ASTHMA, UNSPECIFIED 06/25/2007    Immunization History  Administered Date(s) Administered   Fluad Quad(high Dose 65+) 05/14/2019, 05/26/2020, 06/08/2021   Influenza Split 07/02/2011, 07/17/2012    Influenza, High Dose Seasonal PF 05/29/2017   Influenza,inj,Quad PF,6+ Mos 05/13/2013, 06/15/2015, 06/19/2016, 06/30/2018   Influenza-Unspecified 06/30/2018   PFIZER Comirnaty(Gray Top)Covid-19 Tri-Sucrose Vaccine 06/14/2021   PFIZER(Purple Top)SARS-COV-2 Vaccination 09/25/2019, 10/16/2019, 06/07/2020   Pneumococcal Conjugate-13 10/10/2017   Pneumococcal Polysaccharide-23 12/20/2014, 11/11/2019   Td 02/02/2010   Zoster Recombinat (Shingrix) 06/14/2021, 08/14/2021    Conditions to be addressed/monitored:  Hypertension, Hyperlipidemia, Diabetes, Chronic Kidney Disease, and Irritability and Pain  Conditions addressed this visit: Hypertension, CKD, diabetes  Care Plan : [place]  Updates made by Russell Austin, [place] since 10/30/2021 12:00 AM     Problem: Problem: Hypertension, Hyperlipidemia, Diabetes, Chronic Kidney Disease, and Irritability and Pain      Long-Range Goal: Patient-Specific Goal   Start Date: 04/19/2021  Expected End Date: 04/19/2022  Recent Progress: On track  Priority: High  Note:   Current Barriers:  Unable to independently monitor therapeutic efficacy Unable to achieve control of blood pressure  Unable to self administer medications as prescribed  Pharmacist Clinical Goal(s):  Patient will achieve adherence to monitoring guidelines and medication adherence to achieve therapeutic efficacy achieve control of blood pressure as evidenced by home readings  through collaboration with PharmD and provider.   Interventions: 1:1 collaboration with Russell Peng, NP regarding development and update of comprehensive plan of care as evidenced by provider attestation and co-signature Inter-disciplinary care team collaboration (see longitudinal plan of care) Comprehensive medication review performed; medication list updated in electronic medical record  Hypertension (BP goal <130/80) -Uncontrolled -Current treatment: Amlodipine 64m, 1 tablet once daily -  Appropriate, Query effective, Safe, Accessible Losartan 100 mg 1 tablet once daily - Appropriate, Effective, Query Safe, Accessible Metoprolol tartrate 533m 1 tablet twice daily - Appropriate, Query effective, Safe, Accessible Chlorthalidone 25  mg 1/2 tablet daily - Appropriate, Query effective, Safe, Accessible -Medications previously tried: HCTZ (switched to chlorthalidone) -Current home readings: has been lazy with checking (this morning - arm cuff) -Current dietary habits: tries to limit salt some; does enjoy eating; [place] sea salt  - doesn't use canned and frozen vegetables - not eating out more often -Current exercise habits: sitting down more than usual - can do Austin ups, could go for a -Denies hypotensive/hypertensive symptoms -Educated on Daily salt intake goal < 2300 mg; Exercise goal of 150 minutes per week; Importance of home blood pressure monitoring; Proper BP monitoring technique; -Counseled to monitor BP at home at least once weekly, document, and provide log at future appointments -Counseled on diet and exercise extensively Recommended checking BP daily or every other day.  Hyperlipidemia: (LDL goal < 70) -Controlled -Current treatment: Atorvastatin 34m, 1 tablet once daily - Appropriate, Effective, Safe, Accessible -Medications previously tried: none  -Current dietary patterns: did not discuss -Current exercise habits: did not discuss -Educated on Cholesterol goals;  Exercise goal of 150 minutes per week; -Counseled on diet and exercise extensively Recommended to continue current medication  Diabetes (A1c goal <7%) -Controlled -Current medications: Insulin glargine (Lantus Solostar), inject 10 units into skin daily - Appropriate, Effective, Query Safe, Accessible Glipizide ER 2.5 mg, 1 tablet in the morning and at bedtime (takes first thing) - Appropriate, Effective, Query Safe, Accessible -Medications previously tried: metformin (kidney function), Jardiance  (kidney function) -Current home glucose readings fasting glucose: 71, 80-90s (not been > 150 in a while); lowest 60 - feels shaky Austin prandial glucose: 240 (after ice cream) -Reports hypoglycemic/hyperglycemic symptoms -Current meal patterns:  breakfast: did not discuss  lunch: did not discuss   dinner: did not discuss  snacks: did not discuss  drinks: did not discuss  -Current exercise: did not discuss -Educated on A1c and blood sugar goals; Exercise goal of 150 minutes per week; -Counseled to check feet daily and get yearly eye exams -Counseled on diet and exercise extensively Recommended to continue current medication  Depression/Anxiety/Irritability (Goal: minimize symptoms) -Not ideally controlled -Current treatment: Bupropion 150 mg once daily - Appropriate, Effective, Safe, Accessible -Medications previously tried/failed: none -PHQ9: 0 -Educated on Benefits of medication for symptom control -Recommended to continue current medication  Right hemiparesis  (Goal: minimize symptoms) -Controlled -Current treatment  Gabapentin 308m 1 capsule at bedtime - Appropriate, Effective, Safe, Accessible Tizanidine 50m67m1-2 tablets at bedtime - Appropriate, Effective, Safe, Accessible -Medications previously tried: cyclobenzaprine  -Recommended to continue current medication  History of stroke (Goal: prevent future strokes) -Controlled -Current treatment  Atorvastatin 76m50m tablet once daily  - Appropriate, Effective, Safe, Accessible Aspirin 81mg47mtablet once daily - Appropriate, Effective, Safe, Accessible -Medications previously tried: none  -Recommended to continue current medication  Health Maintenance -Vaccine gaps: shingrix, COVID booster -Current therapy:  none -Educated on Cost vs benefit of each product must be carefully weighed by individual consumer -Patient is satisfied with current therapy and denies issues -Recommended to continue as is  Patient  Goals/Self-Care Activities Patient will:  - take medications as prescribed check glucose with Freestyle libre, document, and provide at future appointments check blood pressure weekly, document, and provide at future appointments target a minimum of 150 minutes of moderate intensity exercise weekly  Follow Up Plan: The care management team will reach out to the patient again over the next 14 days.        Medication Assistance: None required.  Patient affirms current coverage meets  needs.  Compliance/Adherence/Medication fill history: Care Gaps: BP - 120/80 ( 09/08/21) Last A1C - 7.1 on 09/08/2021  Star-Rating Drugs: Losartan 100 mg - Last filled 09/29/21 90 [facility] at [facility] Atorvastatin 10 mg - Last filled 09/29/2021 90 [facility] at [facility] Glipizide 2.5 mg - Last filled 09/29/2021 90 DS at [facility]  Patient's preferred pharmacy is:  [facility] - [place], [place] - [address] [place] [address] Phone: [phone] Fax: [phone]   Uses pill box? Yes Pt endorses 95% compliance  We discussed: Benefits of medication synchronization, packaging and delivery as well as enhanced pharmacist oversight with [facility]. Patient decided to: Utilize [facility] pharmacy for medication synchronization, packaging and delivery  Care Plan and Follow Up Patient Decision:  Patient agrees to Care Plan and Follow-up.  Plan: Telephone follow up appointment with care management team member scheduled for:  4 months  [name], PharmD, [place] Pharmacist [place] [facility] [phone]

## 2021-10-31 DIAGNOSIS — E1140 Type 2 diabetes mellitus with diabetic neuropathy, unspecified: Secondary | ICD-10-CM | POA: Diagnosis not present

## 2021-10-31 DIAGNOSIS — Z794 Long term (current) use of insulin: Secondary | ICD-10-CM | POA: Diagnosis not present

## 2021-10-31 DIAGNOSIS — I10 Essential (primary) hypertension: Secondary | ICD-10-CM

## 2021-11-09 ENCOUNTER — Ambulatory Visit (INDEPENDENT_AMBULATORY_CARE_PROVIDER_SITE_OTHER): Payer: HMO

## 2021-11-09 DIAGNOSIS — E1140 Type 2 diabetes mellitus with diabetic neuropathy, unspecified: Secondary | ICD-10-CM

## 2021-11-09 DIAGNOSIS — G8191 Hemiplegia, unspecified affecting right dominant side: Secondary | ICD-10-CM

## 2021-11-09 DIAGNOSIS — I10 Essential (primary) hypertension: Secondary | ICD-10-CM

## 2021-11-09 DIAGNOSIS — M546 Pain in thoracic spine: Secondary | ICD-10-CM

## 2021-11-09 DIAGNOSIS — E785 Hyperlipidemia, unspecified: Secondary | ICD-10-CM

## 2021-11-09 NOTE — Patient Instructions (Signed)
Visit Information ? ?Thank you for taking time to visit with me today. Please don't hesitate to contact me if I can be of assistance to you before our next scheduled telephone appointment. ? ?Following are the goals we discussed today:  ?Take all medications as prescribed ?Attend all scheduled provider appointments ?Call pharmacy for medication refills 3-7 days in advance of running out of medications ?Call provider office for new concerns or questions  ?keep appointment with eye doctor ?check blood sugar at prescribed times: three times daily and when you have symptoms of low or high blood sugar ?check feet daily for cuts, sores or redness ?trim toenails straight across ?drink 6 to 8 glasses of water each day ?fill half of plate with vegetables ?limit fast food meals to no more than 1 per week ?manage portion size ?switch to sugar-free drinks ?keep feet up while sitting ?check blood pressure daily ?choose a place to take my blood pressure (home, clinic or office, retail store) ?call doctor for signs and symptoms of high blood pressure ?keep all doctor appointments ?take medications for blood pressure exactly as prescribed ?eat more whole grains, fruits and vegetables, lean meats and healthy fats ?limit salt intake to 2300mg /day ?call for medicine refill 2 or 3 days before it runs out ?take all medications exactly as prescribed ?call doctor with any symptoms you believe are related to your medicine ? ?Our next appointment is by telephone on 12/21/21 at 10:45 AM ? ?Please call the care guide team at [phone] if you need to cancel or reschedule your appointment.  ? ?If you are experiencing a Mental Health or [place] or need someone to talk to, please call the Suicide and Crisis Lifeline: 988 ?call the [place] National Suicide Prevention Lifeline: 337-638-3424 or TTY: 701-461-2942 TTY 385-488-3601) to talk to a trained counselor ?call 1-800-273-TALK (toll free, 24 hour hotline) ?go to [facility] [address], [place] [phone]) ?call 911  ? ?The patient verbalized understanding of instructions, educational materials, and care plan provided today and agreed to receive a mailed copy of patient instructions, educational materials, and care plan.  ? ?Peter Garter RN, BSN,CCM, CDE ?Care Management Coordinator ? [facility] ?[phone]   ?

## 2021-11-09 NOTE — Chronic Care Management (AMB) (Signed)
Chronic Care Management   CCM RN Visit Note  11/09/2021 Name: Russell Austin MRN: [id number] DOB: 02/16/1951  Subjective: Russell Austin is a 71 y.o. year old male who is a primary care patient of [name], NP. The care management team was consulted for assistance with disease management and care coordination needs.    Engaged with patient by telephone for follow up visit in response to provider referral for case management and/or care coordination services.   Consent to Services:  The patient was given information about Chronic Care Management services, agreed to services, and gave verbal consent prior to initiation of services.  Please see initial visit note for detailed documentation.   Patient agreed to services and verbal consent obtained.   Assessment: Review of patient past medical history, allergies, medications, health status, including review of consultants reports, laboratory and other test data, was performed as part of comprehensive evaluation and provision of chronic care management services.   SDOH (Social Determinants of Health) assessments and interventions performed:    CCM Care Plan  Allergies  Allergen Reactions   Lactose Intolerance (Gi) Diarrhea   Apple Pectin [Pectin] Itching    ITCHY THROAT   Metformin And Related     D/t decreased kidney function    Peach [Prunus Persica] Itching    ITCHY THROAT   Morphine And Related Anxiety    Jittery    Outpatient Encounter Medications as of 11/09/2021  Medication Sig   amLODipine (NORVASC) 10 MG tablet Take 1 tablet (10 mg total) by mouth daily.   aspirin EC 81 MG tablet Take 81 mg by mouth daily.   atorvastatin (LIPITOR) 10 MG tablet Take 1 tablet (10 mg total) by mouth daily.   buPROPion (WELLBUTRIN SR) 150 MG 12 hr tablet TAKE ONE TABLET BY MOUTH EVERY MORNING   chlorthalidone (HYGROTON) 25 MG tablet Take 12.5 mg by mouth daily.   Continuous Blood Gluc Receiver (FREESTYLE LIBRE 14 DAY READER)  DEVI Use with freestyle libre system   Continuous Blood Gluc Sensor (FREESTYLE LIBRE 14 DAY SENSOR) MISC Use with libre system   diclofenac Sodium (VOLTAREN) 1 % GEL Apply 4 g topically 4 (four) times daily.   gabapentin (NEURONTIN) 300 MG capsule TAKE ONE CAPSULE BY MOUTH EVERYDAY AT BEDTIME   glipiZIDE (GLUCOTROL XL) 2.5 MG 24 hr tablet TAKE ONE TABLET BY MOUTH EVERY MORNING and TAKE ONE TABLET BY MOUTH EVERY EVENING   insulin glargine (LANTUS SOLOSTAR) 100 UNIT/ML Solostar Pen INJECT 35 UNITS INTO THE SKIN DAILY. (Patient taking differently: Inject 10 Units into the skin daily.)   Insulin Pen Needle (B-D ULTRAFINE III SHORT PEN) 31G X 8 MM MISC USE WITH LANUTS PEN   losartan (COZAAR) 100 MG tablet Take 1 tablet (100 mg total) by mouth every morning.   metoprolol tartrate (LOPRESSOR) 50 MG tablet Take 1 tablet (50 mg total) by mouth 2 (two) times daily.   mometasone-formoterol (DULERA) 100-5 MCG/ACT AERO Inhale 2 puffs into the lungs 2 (two) times daily.   Spacer/Aero-Holding Chambers (AEROCHAMBER MAX W/FLOW-VU) MISC by Does not apply route. Use as directed   tiZANidine (ZANAFLEX) 2 MG tablet TAKE ONE TABLET BY MOUTH EVERYDAY AT BEDTIME   No facility-administered encounter medications on file as of 11/09/2021.    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)    CKD stage 2 due to type 2 diabetes mellitus ([place]) 02/12/2015   Right hemiparesis (HCC)    Type 2 diabetes mellitus with diabetic neuropathy ([place])  02/10/2015   Neck pain    Numbness and tingling 02/09/2015   Left shoulder pain 07/17/2012   Plantar fasciitis, bilateral 07/17/2012   Peripheral neuropathy 11/06/2007   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   Essential hypertension 07/08/2007   EXTRINSIC ASTHMA, UNSPECIFIED 06/25/2007    Conditions to be addressed/monitored:HTN, HLD, DMII, and Osteoarthritis  Care Plan : RN Care Manager Plan of Care  Updates made by [name], RN since 11/09/2021 12:00 AM     Problem: Chronic Disease Management and Care Coordination Needs (DM2,HTN and HLD   Priority: High     Long-Range Goal: Establish Plan of Care for Chronic Disease Management Needs (DM2,HTN and HLD)   Start Date: 07/17/2021  Expected End Date: 03/10/2022  Recent Progress: On track  Priority: High  Note:   Current Barriers:  Knowledge Deficits related to plan of care for management of HTN, HLD, and DMII Chronic Disease Management support and education needs related to HTN, HLD, and DMII States his blood sugars have been good and he is scanning his Freestyle Libre 3 times a day ranges from 77-160. States he feels low around 60 in the early morning if he forgets to eat a bedtime snack.  States he is taking 10 units of Lantus daily. States his wife is preparing healthy meals and he is eating a snack at bedtime to avoid having a low during the night. States he has been walking about 3 days a week for about 20 minutes weather permitting States his B/P was 180/77 this morning when he first got up but his reading before that was 152/80.  States he has been having pain in his rt side when he gets up in the morning which goes away as the day goes by.  States he sleeps on his lt side or back only.   States his chronic pain in his hand is unchanged   RNCM Clinical Goal(s):  Patient will verbalize understanding of plan for management of HTN, HLD, and DMII as evidenced by voiced adherence to plan of care verbalize basic understanding of  HTN, HLD,  and DMII disease process and self health management plan as evidenced by voiced understanding and teach back take all medications exactly as prescribed and will call provider for medication related questions as evidenced by dispense report and pt verbalization  attend all scheduled medical appointments: CCM Pharm D 02/23/22, [name] NP 12/19/21 as evidenced by medical records demonstrate Improved adherence to prescribed treatment plan for HTN, HLD, and DMII as evidenced by readings within limits, adherence to plan of care continue to work with RN Care Manager to address care management and care coordination needs related to  HTN, HLD, and DMII as evidenced by adherence to CM Team Scheduled appointments through collaboration with RN Care manager, provider, and care team.   Interventions: 1:1 collaboration with primary care provider regarding development and update of comprehensive plan of care as evidenced by provider attestation and co-signature Inter-disciplinary care team collaboration (see longitudinal plan of care) Evaluation of current treatment plan related to  self  management and patient's adherence to plan as established by provider   Hyperlipidemia Interventions:  (Status:  Goal on track:  Yes.) Long Term Goal Medication review performed; medication list updated in electronic medical record.  Provider established cholesterol goals reviewed Counseled on importance of regular laboratory monitoring as prescribed Reviewed role and benefits of statin for ASCVD risk reduction Discussed strategies to manage statin-induced myalgias Reviewed importance of limiting foods high in cholesterol  Hypertension Interventions:  (Status:  Goal on track:  NO.) Long Term Goal Last practice recorded BP readings:  BP Readings from Last 3 Encounters:  10/25/21 (!) 150/78  09/08/21 120/80  06/08/21 (!) 170/90  Most recent eGFR/CrCl: No results found for: EGFR  No components found for: CRCL  Evaluation  of current treatment plan related to hypertension self management and patient's adherence to plan as established by provider Provided education to patient re: stroke prevention, s/s of heart attack and stroke Reviewed medications with patient and discussed importance of compliance Discussed plans with patient for ongoing care management follow up and provided patient with direct contact information for care management team Advised patient, providing education and rationale, to monitor blood pressure daily and record, calling PCP for findings outside established parameters Provided education on prescribed diet low sodium low CHO Discussed complications of poorly controlled blood pressure such as heart disease, stroke, circulatory complications, vision complications, kidney impairment, sexual dysfunction Reviewed to try taking his B/P after he has taken his B/P medications and to contact his provider if continues to be elevated.  Reviewed how pain can make his B/P elevated    Mailed [place]  calendar Diabetes Interventions:(Status:  Goal on track:  Yes.)  Long Term Goal Assessed patient's understanding of A1c goal: <7% Provided education to patient about basic DM disease process Reviewed medications with patient and discussed importance of medication adherence Counseled on importance of regular laboratory monitoring as prescribed Reviewed scheduled/upcoming provider appointments including: CCM PharmD 6/23, [name] NP 12/19/21 Advised patient, providing education and rationale, to check cbg scan 3 times a day and record, calling provider for findings outside established parameters Reinforced goals for time in range and how that effects his A1C value,  Reinforced s/sx of hypoglycemia and how to treat and importance to having bedtime snack Lab Results  Component Value Date   HGBA1C 7.1 (A) 09/08/2021  Pain Interventions:  (Status:  Goal on track:  Yes.) Long Term Goal Pain  assessment performed Medications reviewed Reviewed provider established plan for pain management Discussed importance of adherence to all scheduled medical appointments Counseled on the importance of reporting any/all new or changed pain symptoms or management strategies to pain management provider Discussed use of relaxation techniques and/or diversional activities to assist with pain reduction (distraction, imagery, relaxation, massage, acupressure, TENS, heat, and cold application Reviewed with patient prescribed pharmacological and nonpharmacological pain relief strategies Reviewed to try to change position at night when sleeping and to try to use pillows to help him stay in position    Patient Goals/Self-Care Activities: Take all medications as prescribed Attend all scheduled provider appointments Call pharmacy for medication refills 3-7 days in advance of running out of medications Call provider office for new concerns or questions  keep appointment with eye doctor check blood sugar at prescribed times: three times daily and when you have symptoms of low or high blood sugar check feet daily for cuts, sores or redness trim toenails straight across drink 6 to 8 glasses of water each day fill half of  plate with vegetables limit fast food meals to no more than 1 per week manage portion size switch to sugar-free drinks keep feet up while sitting check blood pressure daily choose a place to take my blood pressure (home, clinic or office, retail store) call doctor for signs and symptoms of high blood pressure keep all doctor appointments take medications for blood pressure exactly as prescribed eat more whole grains, fruits and vegetables, lean meats and healthy fats limit salt intake to 2382m/day call for medicine refill 2 or 3 days before it runs out take all medications exactly as prescribed call doctor with any symptoms you believe are related to your medicine Follow Up Plan:   Telephone follow up appointment with care management team member scheduled for:  12/21/21 The patient has been provided with contact information for the care management team and has been advised to call with any health related questions or concerns.       Plan:Telephone follow up appointment with care management team member scheduled for:  12/21/21 The patient has been provided with contact information for the care management team and has been advised to call with any health related questions or concerns.  MPeter GarterRN, BJackquline Denmark CDE Care Management Coordinator [facility] ([phone]

## 2021-11-13 ENCOUNTER — Telehealth: Payer: Self-pay | Admitting: Pharmacist

## 2021-11-13 NOTE — Chronic Care Management (AMB) (Unsigned)
Chronic Care Management Pharmacy Assistant   Name: Russell Austin  MRN: [id number] DOB: 18-Sep-1950  Reason for Encounter: Disease State Hypertension Assessment   Conditions to be addressed/monitored: HTN  Recent office visits:  11/09/21 Patient presented for Nurse CCM Visit. No medication changes noted.  Recent consult visits:  None  Hospital visits:  None in previous 6 months  Medications: Outpatient Encounter Medications as of 11/13/2021  Medication Sig   amLODipine (NORVASC) 10 MG tablet Take 1 tablet (10 mg total) by mouth daily.   aspirin EC 81 MG tablet Take 81 mg by mouth daily.   atorvastatin (LIPITOR) 10 MG tablet Take 1 tablet (10 mg total) by mouth daily.   buPROPion (WELLBUTRIN SR) 150 MG 12 hr tablet TAKE ONE TABLET BY MOUTH EVERY MORNING   chlorthalidone (HYGROTON) 25 MG tablet Take 12.5 mg by mouth daily.   Continuous Blood Gluc Receiver (FREESTYLE LIBRE 14 DAY READER) DEVI Use with freestyle libre system   Continuous Blood Gluc Sensor (FREESTYLE LIBRE 14 DAY SENSOR) MISC Use with libre system   diclofenac Sodium (VOLTAREN) 1 % GEL Apply 4 g topically 4 (four) times daily.   gabapentin (NEURONTIN) 300 MG capsule TAKE ONE CAPSULE BY MOUTH EVERYDAY AT BEDTIME   glipiZIDE (GLUCOTROL XL) 2.5 MG 24 hr tablet TAKE ONE TABLET BY MOUTH EVERY MORNING and TAKE ONE TABLET BY MOUTH EVERY EVENING   insulin glargine (LANTUS SOLOSTAR) 100 UNIT/ML Solostar Pen INJECT 35 UNITS INTO THE SKIN DAILY. (Patient taking differently: Inject 10 Units into the skin daily.)   Insulin Pen Needle (B-D ULTRAFINE III SHORT PEN) 31G X 8 MM MISC USE WITH LANUTS PEN   losartan (COZAAR) 100 MG tablet Take 1 tablet (100 mg total) by mouth every morning.   metoprolol tartrate (LOPRESSOR) 50 MG tablet Take 1 tablet (50 mg total) by mouth 2 (two) times daily.   mometasone-formoterol (DULERA) 100-5 MCG/ACT AERO Inhale 2 puffs into the lungs 2 (two) times daily.   Spacer/Aero-Holding Chambers  (AEROCHAMBER MAX W/FLOW-VU) MISC by Does not apply route. Use as directed   tiZANidine (ZANAFLEX) 2 MG tablet TAKE ONE TABLET BY MOUTH EVERYDAY AT BEDTIME   No facility-administered encounter medications on file as of 11/13/2021.   Reviewed chart prior to disease state call. Spoke with patient regarding BP  Recent Office Vitals: BP Readings from Last 3 Encounters:  10/25/21 (!) 150/78  09/08/21 120/80  06/08/21 (!) 170/90   Pulse Readings from Last 3 Encounters:  10/25/21 68  09/08/21 (!) 59  06/08/21 80    Wt Readings from Last 3 Encounters:  10/25/21 187 lb 14.4 oz (85.2 kg)  09/08/21 184 lb (83.5 kg)  06/08/21 185 lb (83.9 kg)     Kidney Function Lab Results  Component Value Date/Time   CREATININE 3.82 (H) 12/09/2020 08:04 AM   CREATININE 2.07 (H) 11/11/2019 07:44 AM   GFR 15.40 (L) 12/09/2020 08:04 AM   GFRNONAA 41 (L) 10/28/2018 09:12 AM   GFRAA 47 (L) 10/28/2018 09:12 AM    BMP Latest Ref Rng & Units 12/09/2020 11/11/2019 10/30/2018  Glucose 70 - 99 mg/dL 95 196(H) 368(H)  BUN 6 - 23 mg/dL 53(H) 31(H) 38(H)  Creatinine 0.40 - 1.50 mg/dL 3.82(H) 2.07(H) 1.99(H)  Sodium 135 - 145 mEq/L 136 139 136  Potassium 3.5 - 5.1 mEq/L 4.2 4.7 5.3(H)  Chloride 96 - 112 mEq/L 103 107 102  CO2 19 - 32 mEq/L 26 28 27   Calcium 8.4 - 10.5 mg/dL 8.6 8.7 8.7  Current antihypertensive regimen:  Amlodipine 10mg , 1 tablet once daily - Appropriate, Query effective, Safe, Accessible Losartan 100 mg 1 tablet once daily - Appropriate, Effective, Query Safe, Accessible Metoprolol tartrate 50mg , 1 tablet twice daily - Appropriate, Query effective, Safe, Accessible Chlorthalidone 25 mg 1/2 tablet daily - Appropriate, Query effective, Safe, Accessible How often are you checking your Blood Pressure? {CHL HP BP Monitoring Frequency:[phone]} Current home BP readings: *** What recent interventions/DTPs have been made by any provider to improve Blood Pressure control since last CPP Visit:  *** Any recent hospitalizations or ED visits since last visit with CPP? {yes/no:20286} What diet changes have been made to improve Blood Pressure Control?  *** What exercise is being done to improve your Blood Pressure Control?  ***  Need follow  up with maddie June/July  Adherence Review: Is the patient currently on ACE/ARB medication? Yes Does the patient have >5 day gap between last estimated fill dates? No    Care Gaps: COVID Booster - Overdue BP- 120/80 (09/08/21) AWV- 5/22 CCM -  Lab Results  Component Value Date   HGBA1C 7.1 (A) 09/08/2021    Star Rating Drugs: Losartan 100 mg - Last filled 06/30/21 90 DS at [facility] Atorvastatin 10 mg - Last filled 07/01/2021 90 DS at [facility] Glipizide 2.5 mg - Last filled 06/29/2021 90 DS at [facility] Pharmacist Assistant [phone]

## 2021-12-01 DIAGNOSIS — I10 Essential (primary) hypertension: Secondary | ICD-10-CM | POA: Diagnosis not present

## 2021-12-01 DIAGNOSIS — E785 Hyperlipidemia, unspecified: Secondary | ICD-10-CM | POA: Diagnosis not present

## 2021-12-01 DIAGNOSIS — Z794 Long term (current) use of insulin: Secondary | ICD-10-CM | POA: Diagnosis not present

## 2021-12-01 DIAGNOSIS — E1140 Type 2 diabetes mellitus with diabetic neuropathy, unspecified: Secondary | ICD-10-CM

## 2021-12-19 ENCOUNTER — Encounter: Payer: Self-pay | Admitting: Adult Health

## 2021-12-19 ENCOUNTER — Ambulatory Visit (INDEPENDENT_AMBULATORY_CARE_PROVIDER_SITE_OTHER): Payer: HMO | Admitting: Adult Health

## 2021-12-19 VITALS — BP 118/80 | HR 65 | Temp 97.9°F | Ht 69.0 in | Wt 185.0 lb

## 2021-12-19 DIAGNOSIS — Z0000 Encounter for general adult medical examination without abnormal findings: Secondary | ICD-10-CM | POA: Diagnosis not present

## 2021-12-19 DIAGNOSIS — Z794 Long term (current) use of insulin: Secondary | ICD-10-CM

## 2021-12-19 DIAGNOSIS — N185 Chronic kidney disease, stage 5: Secondary | ICD-10-CM | POA: Diagnosis not present

## 2021-12-19 DIAGNOSIS — I10 Essential (primary) hypertension: Secondary | ICD-10-CM | POA: Diagnosis not present

## 2021-12-19 DIAGNOSIS — R2681 Unsteadiness on feet: Secondary | ICD-10-CM

## 2021-12-19 DIAGNOSIS — N400 Enlarged prostate without lower urinary tract symptoms: Secondary | ICD-10-CM

## 2021-12-19 DIAGNOSIS — E1140 Type 2 diabetes mellitus with diabetic neuropathy, unspecified: Secondary | ICD-10-CM

## 2021-12-19 DIAGNOSIS — I6302 Cerebral infarction due to thrombosis of basilar artery: Secondary | ICD-10-CM

## 2021-12-19 DIAGNOSIS — G8191 Hemiplegia, unspecified affecting right dominant side: Secondary | ICD-10-CM

## 2021-12-19 DIAGNOSIS — G6289 Other specified polyneuropathies: Secondary | ICD-10-CM | POA: Diagnosis not present

## 2021-12-19 DIAGNOSIS — E785 Hyperlipidemia, unspecified: Secondary | ICD-10-CM | POA: Diagnosis not present

## 2021-12-19 LAB — COMPREHENSIVE METABOLIC PANEL
ALT: 19 U/L (ref 0–53)
AST: 20 U/L (ref 0–37)
Albumin: 3.7 g/dL (ref 3.5–5.2)
Alkaline Phosphatase: 145 U/L — ABNORMAL HIGH (ref 39–117)
BUN: 44 mg/dL — ABNORMAL HIGH (ref 6–23)
CO2: 25 mEq/L (ref 19–32)
Calcium: 9 mg/dL (ref 8.4–10.5)
Chloride: 108 mEq/L (ref 96–112)
Creatinine, Ser: 3.52 mg/dL — ABNORMAL HIGH (ref 0.40–1.50)
GFR: 16.87 mL/min — ABNORMAL LOW (ref >60.00)
Glucose, Bld: 170 mg/dL — ABNORMAL HIGH (ref 70–99)
Potassium: 4.9 mEq/L (ref 3.5–5.1)
Sodium: 140 mEq/L (ref 135–145)
Total Bilirubin: 0.9 mg/dL (ref 0.2–1.2)
Total Protein: 6.5 g/dL (ref 6.0–8.3)

## 2021-12-19 LAB — HEMOGLOBIN A1C: Hgb A1c MFr Bld: 7.7 % — ABNORMAL HIGH (ref 4.6–6.5)

## 2021-12-19 LAB — LIPID PANEL
Cholesterol: 118 mg/dL (ref 0–200)
HDL: 31.6 mg/dL — ABNORMAL LOW (ref >39.00)
LDL Cholesterol: 62 mg/dL (ref 0–99)
NonHDL: 86.83
Total CHOL/HDL Ratio: 4
Triglycerides: 123 mg/dL (ref 0.0–149.0)
VLDL: 24.6 mg/dL (ref 0.0–40.0)

## 2021-12-19 LAB — CBC WITH DIFFERENTIAL/PLATELET
Basophils Absolute: 0 10*3/uL (ref 0.0–0.1)
Basophils Relative: 0.3 % (ref 0.0–3.0)
Eosinophils Absolute: 0.1 10*3/uL (ref 0.0–0.7)
Eosinophils Relative: 3.6 % (ref 0.0–5.0)
HCT: 35.4 % — ABNORMAL LOW (ref 39.0–52.0)
Hemoglobin: 11.9 g/dL — ABNORMAL LOW (ref 13.0–17.0)
Lymphocytes Relative: 26.4 % (ref 12.0–46.0)
Lymphs Abs: 1.1 10*3/uL (ref 0.7–4.0)
MCHC: 33.5 g/dL (ref 30.0–36.0)
MCV: 91.7 fl (ref 78.0–100.0)
Monocytes Absolute: 0.4 10*3/uL (ref 0.1–1.0)
Monocytes Relative: 8.9 % (ref 3.0–12.0)
Neutro Abs: 2.4 10*3/uL (ref 1.4–7.7)
Neutrophils Relative %: 60.8 % (ref 43.0–77.0)
Platelets: 248 10*3/uL (ref 150.0–400.0)
RBC: 3.86 Mil/uL — ABNORMAL LOW (ref 4.22–5.81)
RDW: 13 % (ref 11.5–15.5)
WBC: 4 10*3/uL (ref 4.0–10.5)

## 2021-12-19 LAB — TSH: TSH: 4.86 u[IU]/mL (ref 0.35–5.50)

## 2021-12-19 LAB — PSA: PSA: 0.73 ng/mL (ref 0.10–4.00)

## 2021-12-19 MED ORDER — TIZANIDINE HCL 2 MG PO TABS: ORAL_TABLET | ORAL | 1 refills | Status: DC

## 2021-12-19 MED ORDER — LANTUS SOLOSTAR 100 UNIT/ML ~~LOC~~ SOPN
10.0000 [IU] | PEN_INJECTOR | Freq: Every day | SUBCUTANEOUS | 0 refills | Status: DC
Start: 2021-12-19 — End: 2022-03-20

## 2021-12-19 MED ORDER — GLIPIZIDE ER 2.5 MG PO TB24: ORAL_TABLET | ORAL | 0 refills | Status: DC

## 2021-12-19 MED ORDER — BD PEN NEEDLE SHORT U/F 31G X 8 MM MISC: 3 refills | Status: DC

## 2021-12-19 MED ORDER — FREESTYLE LIBRE 14 DAY SENSOR MISC: 3 refills | Status: DC

## 2021-12-19 MED ORDER — MOMETASONE FURO-FORMOTEROL FUM 100-5 MCG/ACT IN AERO: 2.0000 | INHALATION_SPRAY | Freq: Two times a day (BID) | RESPIRATORY_TRACT | 11 refills | Status: DC

## 2021-12-19 MED ORDER — GABAPENTIN 300 MG PO CAPS: ORAL_CAPSULE | ORAL | 1 refills | Status: DC

## 2021-12-19 NOTE — Progress Notes (Signed)
? ?Subjective:  ? ? Patient ID: Russell Austin, male    DOB: December 17, 1950, 71 y.o.   MRN: [id number] ? ?HPI ?Patient presents for yearly preventative medicine examination. 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 ? ?DM Type 2 -currently managed with Lantus 10 units daily and glipizide 2.5 mg twice daily.  In the past we have had issues with him taking his insulin daily.  When he was last seen in January he reports that he was maybe taking it twice a week.  He does use the [facility] system to monitor his blood sugars.  He reports that he can continue to forego insulin most days as he is scared that it can cause hypoglycemic events.  He does try and eat healthy but does not lead an active lifestyle, most of the day he spends sitting at a desk playing on the computer. ? ?Lab Results  ?Component Value Date  ? HGBA1C 7.1 (A) 09/08/2021  ? ?HTN -managed with Norvasc 10 mg daily, losartan 100 mg, chlorthalidone 12.5 mg daily,  and metoprolol 50 mg twice daily.  He denies dizziness, lightheadedness, chest pain, or shortness of breath.  He does monitor his blood pressures at home with readings in the 130s to 140s over 70s to 80s. ?BP Readings from Last 3 Encounters:  ?12/19/21 118/80  ?10/25/21 (!) 150/78  ?09/08/21 120/80  ? ?Hyperlipidemia-managed with Lipitor 10 mg daily.  He denies myalgia or fatigue ?Lab Results  ?Component Value Date  ? CHOL 98 12/09/2020  ? HDL 30.20 (L) 12/09/2020  ? [place] 48 12/09/2020  ? TRIG 101.0 12/09/2020  ? CHOLHDL 3 12/09/2020  ? ?Lower extremity edema-has used Lasix in the past, currently out of the medication.  Legs are edematous ? 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  Has right-sided hemiplegia.  Been prescribed gabapentin 300 mg at bedtime  and tizanidine 2 mg nightly ? ?BPH-symptoms absent without medication ? ?Gait instability -for the last couple months he has felt more unsteady with walking.  He has had a single fall roughly 3 weeks ago thankfully with no injury.  As mentioned above he does lead a relatively sedentary lifestyle is likely the cause of his gait instability.  He is interested in going to physical therapy to help with strengthening exercises ? 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 Looking at dialysis.  ? ?All immunizations and health maintenance protocols were reviewed with the patient and needed orders were placed. ? ?Appropriate screening laboratory values were ordered for the patient including screening of hyperlipidemia, renal function and hepatic function. ?If indicated by BPH, a PSA was ordered. ? ?Medication reconciliation,  past medical history, social history, problem list and allergies were reviewed in detail with the patient ? ?Goals were established with regard to weight loss, exercise, and  diet in compliance with medications ?Wt Readings from Last 3 Encounters:  ?12/19/21 185 lb (83.9 kg)  ?10/25/21 187 lb 14.4 oz (85.2 kg)  ?09/08/21 184 lb (83.5 kg)  ? ? ?Review of Systems  ?Constitutional: Negative.   ?HENT: Negative.    ?Eyes: Negative.   ?Respiratory: Negative.    ?Cardiovascular:  Positive for leg swelling.  ?Gastrointestinal: Negative.   ?Endocrine: Negative.   ?Genitourinary: Negative.   ?Musculoskeletal:  Positive for gait problem.  ?  Skin: Negative.   ?Allergic/Immunologic: Negative.   ?Neurological:  Positive for weakness and numbness.  ?Hematological: Negative.   ?Psychiatric/Behavioral: Negative.    ?All other systems reviewed and are negative. ? ?Past Medical History:  ?Diagnosis Date  ? 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 Diabetes mellitus type II   ? Diabetic retinopathy FOLLOWED BY DR Zadie Rhine  ? ED (erectile dysfunction)   ? Glaucoma of both eyes   ? Hypertension   ? Left hydrocele    ? Stroke [facility])   ? ? ?Social History  ? ?Socioeconomic History  ? Marital status: Married  ?  Spouse name: Not on file  ? Number of children: 1  ? Years of education: 27  ? Highest education level: Not on file  ?Occupational History  ? Occupation: Disabled  ?Tobacco Use  ? Smoking status: Never  ? Smokeless tobacco: Never  ?Substance and Sexual Activity  ? Alcohol use: No  ? Drug use: No  ? Sexual activity: Not on file  ?Other Topics Concern  ? Not on file  ?Social History Narrative  ? Lives at home with his wife and granddaughter.  ? Left-handed.  ? 3 cups caffeine per day.  ? ?Social Determinants of Health  ? ?Financial Resource Strain: Low Risk   ? Difficulty of Paying Living Expenses: Not hard at all  ?Food Insecurity: No Food Insecurity  ? Worried About Charity fundraiser in the Last Year: Never true  ? Ran Out of Food in the Last Year: Never true  ?Transportation Needs: No Transportation Needs  ? Lack of Transportation (Medical): No  ? Lack of Transportation (Non-Medical): No  ?Physical Activity: Inactive  ? Days of Exercise per Week: 0 days  ? Minutes of Exercise per Session: 0 min  ?Stress: No Stress Concern Present  ? Feeling of Stress : Not at all  ?Social Connections: Socially Integrated  ? Frequency of Communication with Friends and Family: More than three times a week  ? Frequency of Social Gatherings with Friends and Family: Twice a week  ? Attends Religious Services: More than 4 times per year  ? Active Member of Clubs or Organizations: Yes  ? Attends Archivist Meetings: 1 to 4 times per year  ? Marital Status: Married  ?Intimate Partner Violence: Not At Risk  ? Fear of Current or Ex-Partner: No  ? Emotionally Abused: No  ? Physically Abused: No  ? Sexually Abused: No  ? ? ?Past Surgical History:  ?Procedure Laterality Date  ? APPENDECTOMY  AGE EARLY 20'S  ? COLONOSCOPY  12/30/2020  ? every 5 years  ? HYDROCELE EXCISION  03/31/2012  ? Procedure: HYDROCELECTOMY ADULT;  Surgeon: Franchot Gallo, MD;  Location: [facility];  Service: Urology;  Laterality: Left;  45 MINS ? ?  ? LEFT EYE LASER RETINA REPAIR  SEPT 2012  ? RIGHT EYE VITRECTOMY/ INSERTION GLAUCOMA SETON/ LASER REPAIR  12-13-2008  ? RETINAL ARTERY OCCLUSION /NEOVASCULAR GLAUCOMA/ HEMORRHAGE  ? RIGHT EYE VITRETOMY/ INSERTION GLAUCOMA SETON X2/ LASER  03-24-2009  ? RECURRENT HEMORRHAGE/ OCCLUSION INTERNAL SETON  ? SHOULDER ARTHROSCOPY Right 2005  ? undescended right testicle removed  1994  ? ? ?Family History  ?Problem Relation Age of Onset  ? Glaucoma Mother   ? Diabetes Mother   ? Stomach cancer Maternal Grandfather   ? Stroke Maternal Grandmother   ? ? ?Allergies  ?Allergen Reactions  ? Lactose Intolerance (Gi) Diarrhea  ? Apple Pectin [Pectin]  Itching  ?  ITCHY THROAT  ? Metformin And Related   ?  D/t decreased kidney function   ? Peach [Prunus Persica] Itching  ?  ITCHY THROAT  ? Morphine And Related Anxiety  ?  Jittery  ? ? ?Current Outpatient Medications on File Prior to Visit  ?Medication Sig Dispense Refill  ? amLODipine (NORVASC) 10 MG tablet Take 1 tablet (10 mg total) by mouth daily. 90 tablet 2  ? aspirin EC 81 MG tablet Take 81 mg by mouth daily.    ? atorvastatin (LIPITOR) 10 MG tablet Take 1 tablet (10 mg total) by mouth daily. 90 tablet 3  ? buPROPion (WELLBUTRIN SR) 150 MG 12 hr tablet TAKE ONE TABLET BY MOUTH EVERY MORNING 90 tablet 0  ? chlorthalidone (HYGROTON) 25 MG tablet Take 12.5 mg by mouth daily.    ? Continuous Blood Gluc Receiver (FREESTYLE LIBRE 14 DAY READER) DEVI Use with freestyle libre system 1 each 0  ? Continuous Blood Gluc Sensor (FREESTYLE LIBRE 14 DAY SENSOR) MISC Use with libre system 6 each 3  ? gabapentin (NEURONTIN) 300 MG capsule TAKE ONE CAPSULE BY MOUTH EVERYDAY AT BEDTIME 90 capsule 0  ? glipiZIDE (GLUCOTROL XL) 2.5 MG 24 hr tablet TAKE ONE TABLET BY MOUTH EVERY MORNING and TAKE ONE TABLET BY MOUTH EVERY EVENING 180 tablet 0  ? insulin glargine (LANTUS SOLOSTAR) 100 UNIT/ML  Solostar Pen INJECT 35 UNITS INTO THE SKIN DAILY. (Patient taking differently: Inject 10 Units into the skin daily.) 15 mL 1  ? Insulin Pen Needle (B-D ULTRAFINE III SHORT PEN) 31G X 8 MM MISC USE WITH LANUTS P

## 2021-12-20 ENCOUNTER — Ambulatory Visit: Payer: HMO

## 2021-12-21 ENCOUNTER — Ambulatory Visit (INDEPENDENT_AMBULATORY_CARE_PROVIDER_SITE_OTHER): Payer: HMO

## 2021-12-21 DIAGNOSIS — I10 Essential (primary) hypertension: Secondary | ICD-10-CM

## 2021-12-21 DIAGNOSIS — M545 Low back pain, unspecified: Secondary | ICD-10-CM

## 2021-12-21 DIAGNOSIS — E785 Hyperlipidemia, unspecified: Secondary | ICD-10-CM

## 2021-12-21 DIAGNOSIS — E1140 Type 2 diabetes mellitus with diabetic neuropathy, unspecified: Secondary | ICD-10-CM

## 2021-12-21 NOTE — Chronic Care Management (AMB) (Signed)
?Chronic Care Management  ? ?CCM RN Visit Note ? ?12/21/2021 ?Name: Russell Austin MRN: [id number] DOB: 03-Sep-1951 ? ?Subjective: ?Russell Austin is a 71 y.o. year old male who is a primary care patient of [name], NP. The care management team was consulted for assistance with disease management and care coordination needs.   ? ?Engaged with patient by telephone for follow up visit in response to provider referral for case management and/or care coordination services.  ? ?Consent to Services:  ?The patient was given information about Chronic Care Management services, agreed to services, and gave verbal consent prior to initiation of services.  Please see initial visit note for detailed documentation.  ? ?Patient agreed to services and verbal consent obtained.  ? ?Assessment: Review of patient past medical history, allergies, medications, health status, including review of consultants reports, laboratory and other test data, was performed as part of comprehensive evaluation and provision of chronic care management services.  ? ?SDOH (Social Determinants of Health) assessments and interventions performed:   ? ?CCM Care Plan ? ?Allergies  ?Allergen Reactions  ? Lactose Intolerance (Gi) Diarrhea  ? Apple Pectin [Pectin] Itching  ?  ITCHY THROAT  ? Metformin And Related   ?  D/t decreased kidney function   ? Peach [Prunus Persica] Itching  ?  ITCHY THROAT  ? Morphine And Related Anxiety  ?  Jittery  ? ? ?Outpatient Encounter Medications as of 12/21/2021  ?Medication Sig  ? amLODipine (NORVASC) 10 MG tablet Take 1 tablet (10 mg total) by mouth daily.  ? aspirin EC 81 MG tablet Take 81 mg by mouth daily.  ? atorvastatin (LIPITOR) 10 MG tablet Take 1 tablet (10 mg total) by mouth daily.  ? buPROPion (WELLBUTRIN SR) 150 MG 12 hr tablet TAKE ONE TABLET BY MOUTH EVERY MORNING  ? chlorthalidone (HYGROTON) 25 MG tablet Take 12.5 mg by mouth daily.  ? Continuous Blood Gluc Receiver (FREESTYLE LIBRE 14 DAY READER)  DEVI Use with freestyle libre system  ? Continuous Blood Gluc Sensor (FREESTYLE LIBRE 14 DAY SENSOR) MISC Use with libre system  ? gabapentin (NEURONTIN) 300 MG capsule TAKE ONE CAPSULE BY MOUTH EVERYDAY AT BEDTIME  ? glipiZIDE (GLUCOTROL XL) 2.5 MG 24 hr tablet TAKE ONE TABLET BY MOUTH EVERY MORNING and TAKE ONE TABLET BY MOUTH EVERY EVENING  ? insulin glargine (LANTUS SOLOSTAR) 100 UNIT/ML Solostar Pen Inject 10 Units into the skin daily.  ? Insulin Pen Needle (B-D ULTRAFINE III SHORT PEN) 31G X 8 MM MISC USE WITH LANUTS PEN  ? losartan (COZAAR) 100 MG tablet Take 1 tablet (100 mg total) by mouth every morning.  ? metoprolol tartrate (LOPRESSOR) 50 MG tablet Take 1 tablet (50 mg total) by mouth 2 (two) times daily.  ? mometasone-formoterol (DULERA) 100-5 MCG/ACT AERO Inhale 2 puffs into the lungs 2 (two) times daily.  ? tiZANidine (ZANAFLEX) 2 MG tablet TAKE ONE TABLET BY MOUTH EVERYDAY AT BEDTIME  ? ?No facility-administered encounter medications on file as of 12/21/2021.  ? ? ?Patient Active Problem List  ? Diagnosis Date Noted  ? Diabetes mellitus ([place]) 07/14/2020  ? Hyperlipidemia 07/14/2020  ? Osteoarthritis 07/14/2020  ? Plantar fasciitis 07/14/2020  ? Stasis edema of both lower extremities 01/01/2019  ? Primary osteoarthritis of left shoulder 03/20/2018  ? 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 Complex regional pain syndrome I of upper limb 06/27/2015  ? Dysphagia following cerebral infarction 02/15/2015  ? 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 CKD stage 2 due to type 2 diabetes mellitus ([place]) 02/12/2015  ? Right hemiparesis ([place])   ? Type 2 diabetes mellitus with diabetic neuropathy ([place]) 02/10/2015  ? Neck pain   ? Numbness and tingling 02/09/2015  ? Left shoulder pain 07/17/2012  ? Plantar fasciitis, bilateral 07/17/2012  ? Peripheral neuropathy 11/06/2007  ? 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  ? Essential hypertension 07/08/2007  ? EXTRINSIC  ASTHMA, UNSPECIFIED 06/25/2007  ? ? ?Conditions to be addressed/monitored:HTN, HLD, and DMII ? ?Care Plan : RN Care Manager Plan of Care  ?Updates made by [name], RN since 12/21/2021 12:00 AM  ?  ? ?Problem: Chronic Disease Management and Care Coordination Needs (DM2,HTN and HLD   ?Priority: High  ?  ? ?Long-Range Goal: Establish Plan of Care for Chronic Disease Management Needs (DM2,HTN and HLD)   ?Start Date: 07/17/2021  ?Expected End Date: 03/10/2022  ?Recent Progress: On track  ?Priority: High  ?Note:   ?Current Barriers:  ?Knowledge Deficits related to plan of care for management of HTN, HLD, and DMII ?Chronic Disease Management support and education needs related to HTN, HLD, and DMII ?States he saw [name] a few days ago and his A1C had gone up.  States he is back to taking his Lantus insulin every day now. States he is scanning his [employer] 3 times a day ranges from 80-170. Denies any recent low readings.  States his wife is preparing healthy meals and he is eating a snack at bedtime to avoid having a low during the night. States he has been walking about 3 days a week for about 20 minutes weather permitting States his B/Ps have been good at home  States he continues having pain in his rt side when he gets up in the morning which goes away as the day goes by.  States he sleeps on his lt side or back only.   States his chronic pain in his hand is unchanged. States that he had a fall a few weeks ago when he tripped.  States he is going to start PT to help with his walking ? ?RNCM Clinical Goal(s):  ?Patient will verbalize understanding of plan for management of HTN, HLD, and DMII as evidenced by voiced adherence to plan of care ?verbalize basic understanding of  HTN, HLD, and DMII disease process and self health management plan as evidenced by voiced understanding and teach back ?take all medications exactly as prescribed and will call provider for medication related questions as evidenced by  dispense report and pt verbalization  ?attend all scheduled medical appointments: CCM Pharm D 02/23/22, [name] NP 03/20/22, Annual Wellness Visit 01/17/22 as evidenced by medical records ?demonstrate Improved adherence to prescribed treatment plan for HTN, HLD, and DMII as evidenced by readings within limits, adherence to plan of care ?continue to work with RN Care Manager to address care management and care coordination needs related to  HTN, HLD, and DMII as evidenced by adherence to CM Team Scheduled appointments through collaboration with RN Care manager, provider, and care team.  ? ?Interventions: ?1:1 collaboration with primary care provider regarding development and update of comprehensive plan of care as evidenced by provider attestation and co-signature ?Inter-disciplinary care team collaboration (see longitudinal plan of care) ?Evaluation of current treatment plan related to  self management and patient's adherence to plan as established by provider ?  ?Hyperlipidemia Interventions:  (Status:  Goal on track:  Yes.) Long Term Goal ?Medication review performed;  medication list updated in electronic medical record.  ?Provider established cholesterol goals reviewed ?Counseled on importance of regular laboratory monitoring as prescribed ?Reviewed role and benefits of statin for ASCVD risk reduction ?Discussed strategies to manage statin-induced myalgias ?Reviewed importance of limiting foods high in cholesterol ?Reviewed exercise goals and target of 150 minutes per week ? ?Hypertension Interventions:  (Status:  Goal on track:  Yes.) Long Term Goal ?Last practice recorded BP readings:  ?BP Readings from Last 3 Encounters:  ?12/19/21 118/80  ?10/25/21 (!) 150/78  ?09/08/21 120/80  ?Most recent eGFR/CrCl: No results found for: EGFR  No components found for: CRCL ? ?Evaluation of current treatment plan related to hypertension self management and patient's adherence to plan as established by provider ?Provided  education to patient re: stroke prevention, s/s of heart attack and stroke ?Reviewed medications with patient and discussed importance of compliance ?Discussed plans with patient for ongoing care management

## 2021-12-21 NOTE — Patient Instructions (Signed)
Visit Information ? ?Thank you for taking time to visit with me today. Please don't hesitate to contact me if I can be of assistance to you before our next scheduled telephone appointment. ? ?Following are the goals we discussed today:  ?Take all medications as prescribed ?Attend all scheduled provider appointments ?Call pharmacy for medication refills 3-7 days in advance of running out of medications ?Call provider office for new concerns or questions  ?keep appointment with eye doctor ?check blood sugar at prescribed times: three times daily and when you have symptoms of low or high blood sugar ?check feet daily for cuts, sores or redness ?trim toenails straight across ?drink 6 to 8 glasses of water each day ?fill half of plate with vegetables ?limit fast food meals to no more than 1 per week ?manage portion size ?switch to sugar-free drinks ?keep feet up while sitting ?check blood pressure daily ?choose a place to take my blood pressure (home, clinic or office, retail store) ?call doctor for signs and symptoms of high blood pressure ?keep all doctor appointments ?take medications for blood pressure exactly as prescribed ?eat more whole grains, fruits and vegetables, lean meats and healthy fats ?limit salt intake to 2300mg /day ?call for medicine refill 2 or 3 days before it runs out ?take all medications exactly as prescribed ?call doctor with any symptoms you believe are related to your medicine ? ?Our next appointment is by telephone on 02/15/22 at 10:45 AM ? ?Please call the care guide team at [phone] if you need to cancel or reschedule your appointment.  ? ?If you are experiencing a Mental Health or [place] or need someone to talk to, please call the Suicide and Crisis Lifeline: 988 ?call the [place] National Suicide Prevention Lifeline: (609)506-5577 or TTY: 289-484-1017 TTY (917) 643-3418) to talk to a trained counselor ?call 1-800-273-TALK (toll free, 24 hour hotline) ?go to [facility] [address] [place] [phone]) ?call 911  ? ?The patient verbalized understanding of instructions, educational materials, and care plan provided today and agreed to receive a mailed copy of patient instructions, educational materials, and care plan.  ? ?Peter Garter RN, BSN,CCM, CDE ?Care Management Coordinator ?[facility] ?[phone]   ?

## 2021-12-22 ENCOUNTER — Other Ambulatory Visit: Payer: Self-pay | Admitting: Adult Health

## 2021-12-22 ENCOUNTER — Telehealth: Payer: Self-pay | Admitting: Pharmacist

## 2021-12-22 NOTE — Progress Notes (Signed)
? ? ?Chronic Care Management ?Pharmacy Assistant  ? ?Name: Russell Austin  MRN: [id number] DOB: 12/27/1950 ? ?Reason for Encounter: Medication Review/ Medication Coordination ?  ?Recent office visits:  ?12/21/21 Russell Austin - Patient presented for [facility] nurse visit. No medication changes. ? ?12/19/21 Russell Peng, NP - Patient presented for Routine general medical exam and other concerns. Decreased Insulin Glargine. Changed Mometasone. Stopped Diclofenac  ? ?Recent consult visits:  ?None ? ?Hospital visits:  ?None in previous 6 months ? ?Medications: ?Outpatient Encounter Medications as of 12/22/2021  ?Medication Sig  ? amLODipine (NORVASC) 10 MG tablet Take 1 tablet (10 mg total) by mouth daily.  ? aspirin EC 81 MG tablet Take 81 mg by mouth daily.  ? atorvastatin (LIPITOR) 10 MG tablet Take 1 tablet (10 mg total) by mouth daily.  ? buPROPion (WELLBUTRIN SR) 150 MG 12 hr tablet TAKE ONE TABLET BY MOUTH EVERY MORNING  ? chlorthalidone (HYGROTON) 25 MG tablet Take 12.5 mg by mouth daily.  ? Continuous Blood Gluc Receiver (FREESTYLE LIBRE 14 DAY READER) DEVI Use with freestyle libre system  ? Continuous Blood Gluc Sensor (FREESTYLE LIBRE 14 DAY SENSOR) MISC Use with libre system  ? gabapentin (NEURONTIN) 300 MG capsule TAKE ONE CAPSULE BY MOUTH EVERYDAY AT BEDTIME  ? glipiZIDE (GLUCOTROL XL) 2.5 MG 24 hr tablet TAKE ONE TABLET BY MOUTH EVERY MORNING and TAKE ONE TABLET BY MOUTH EVERY EVENING  ? insulin glargine (LANTUS SOLOSTAR) 100 UNIT/ML Solostar Pen Inject 10 Units into the skin daily.  ? Insulin Pen Needle (B-D ULTRAFINE III SHORT PEN) 31G X 8 MM MISC USE WITH LANUTS PEN  ? losartan (COZAAR) 100 MG tablet Take 1 tablet (100 mg total) by mouth every morning.  ? metoprolol tartrate (LOPRESSOR) 50 MG tablet Take 1 tablet (50 mg total) by mouth 2 (two) times daily.  ? mometasone-formoterol (DULERA) 100-5 MCG/ACT AERO Inhale 2 puffs into the lungs 2 (two) times daily.  ? tiZANidine (ZANAFLEX) 2 MG tablet TAKE  ONE TABLET BY MOUTH EVERYDAY AT BEDTIME  ? ?No facility-administered encounter medications on file as of 12/22/2021.  ?Reviewed chart for medication changes ahead of medication coordination call. ? ?No OVs, Consults, or hospital visits since last care coordination call/Pharmacist visit.  ? ?No medication changes indicated  ?BP Readings from Last 3 Encounters:  ?12/19/21 118/80  ?10/25/21 (!) 150/78  ?09/08/21 120/80  ?  ?Lab Results  ?Component Value Date  ? HGBA1C 7.7 (H) 12/19/2021  ?  ? ?Patient obtains medications through Adherence Packaging  90 Days  ? ?Last adherence delivery included:  ?Bupropion SR 150 mg - take one tablet every morning ?Metoprolol Tartrate 50 mg - take one tablet every morning and one tablet every evening ?Atorvastatin 10 mg - take one tablet at breakfast ?Amlodipine 10 mg - take one tablet at breakfast ?Gabapentin 300 mg -  take one capsule at bedtime ?Glipizide ER 2.5 mg - take one tablet every morning and one tablet every evening ?Tizanidine 2 mg - take one tablet at bedtime ?Losartan 100 mg - take one tablet every morning ?Aspirin 81 mg - take one tablet at breakfast ?Chlorthalidone 25 mg - 1/2 tablet at breakfast  ? ?Patient requested to add: ?Freestyle [employer]  ?B-D Ultrafine short Pen needles ? ?Patient is due for next adherence delivery on: 01/03/22 . ?Called patient and reviewed medications and coordinated delivery. ?Packs for 90 DS ? ?This delivery to include: ?Freestyle [employer]  ?B-D Ultrafine short Pen needles ?Tizanidine 2  mg - take one tablet at bedtime ?Gabapentin 300 mg -  take one capsule at bedtime ?Glipizide ER 2.5 mg - take one tablet every morning and one tablet every evening ?Bupropion SR 150 mg - take one tablet every morning ?Aspirin 81 mg - take one tablet at breakfast ?Chlorthalidone 25 mg - 1/2 tablet at breakfast  ?Atorvastatin 10 mg - take one tablet at breakfast ?Amlodipine 10 mg - take one tablet at breakfast ?Metoprolol Tartrate 50 mg - take one  tablet every morning and one tablet every evening ?Losartan 100 mg - take one tablet every morning ? ?Lantus filled 12/20/21 90DS ?PA in process for [facility] ? ? ?Confirmed delivery date of 01/03/22, advised patient that pharmacy will contact them the morning of delivery.  ? ?Care Gaps: ?COVID Booster - Overdue ?BP- 118/80 (12/19/21) ?AWV- 5/22 ?CCM- 6/23 ? ?Star Rating Drugs: ?Losartan 100 mg - Last filled 10/02/21 90 DS at [facility] ?Atorvastatin 10 mg - Last filled 10/02/21 90 DS at [facility] ?Glipizide 2.5 mg - Last filled 10/02/21 90 DS at [facility] ? ? ?Ned Clines CMA ?Clinical Pharmacist Assistant ?[phone] ? ?

## 2021-12-25 DIAGNOSIS — I129 Hypertensive chronic kidney disease with stage 1 through stage 4 chronic kidney disease, or unspecified chronic kidney disease: Secondary | ICD-10-CM | POA: Diagnosis not present

## 2021-12-25 DIAGNOSIS — E785 Hyperlipidemia, unspecified: Secondary | ICD-10-CM | POA: Diagnosis not present

## 2021-12-25 DIAGNOSIS — E1122 Type 2 diabetes mellitus with diabetic chronic kidney disease: Secondary | ICD-10-CM | POA: Diagnosis not present

## 2021-12-25 DIAGNOSIS — N184 Chronic kidney disease, stage 4 (severe): Secondary | ICD-10-CM | POA: Diagnosis not present

## 2021-12-27 ENCOUNTER — Encounter: Payer: Self-pay | Admitting: Podiatry

## 2021-12-27 ENCOUNTER — Ambulatory Visit (INDEPENDENT_AMBULATORY_CARE_PROVIDER_SITE_OTHER): Payer: HMO | Admitting: Podiatry

## 2021-12-27 DIAGNOSIS — L84 Corns and callosities: Secondary | ICD-10-CM

## 2021-12-27 DIAGNOSIS — E1140 Type 2 diabetes mellitus with diabetic neuropathy, unspecified: Secondary | ICD-10-CM | POA: Diagnosis not present

## 2021-12-27 DIAGNOSIS — R600 Localized edema: Secondary | ICD-10-CM | POA: Diagnosis not present

## 2021-12-27 DIAGNOSIS — M2170 Unequal limb length (acquired), unspecified site: Secondary | ICD-10-CM

## 2021-12-27 DIAGNOSIS — Z794 Long term (current) use of insulin: Secondary | ICD-10-CM

## 2021-12-27 DIAGNOSIS — M79674 Pain in right toe(s): Secondary | ICD-10-CM | POA: Diagnosis not present

## 2021-12-27 DIAGNOSIS — M79675 Pain in left toe(s): Secondary | ICD-10-CM

## 2021-12-27 DIAGNOSIS — B351 Tinea unguium: Secondary | ICD-10-CM

## 2021-12-28 ENCOUNTER — Other Ambulatory Visit: Payer: Self-pay | Admitting: Adult Health

## 2021-12-28 MED ORDER — BUDESONIDE-FORMOTEROL FUMARATE 160-4.5 MCG/ACT IN AERO: 2.0000 | INHALATION_SPRAY | Freq: Two times a day (BID) | RESPIRATORY_TRACT | 3 refills | Status: DC

## 2021-12-28 NOTE — Therapy (Incomplete)
?OUTPATIENT PHYSICAL THERAPY NEURO EVALUATION ? ? ?Patient Name: Russell Austin ?MRN: [id number] ?DOB:[birth year], 71 y.o., male ?Today's Date: 12/28/2021 ? ?PCP: [name], NP ?REFERRING PROVIDER: [name], NP ? ? ? ?Past Medical History:  ?Diagnosis Date  ? 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 Diabetes mellitus type II   ? Diabetic retinopathy FOLLOWED BY DR Zadie Rhine  ? ED (erectile dysfunction)   ? Glaucoma of both eyes   ? Hypertension   ? Left hydrocele   ? Stroke [facility])   ? ?Past Surgical History:  ?Procedure Laterality Date  ? APPENDECTOMY  AGE EARLY 20'S  ? COLONOSCOPY  12/30/2020  ? every 5 years  ? HYDROCELE EXCISION  03/31/2012  ? Procedure: HYDROCELECTOMY ADULT;  Surgeon: Franchot Gallo, MD;  Location: [facility];  Service: Urology;  Laterality: Left;  45 MINS ? ?  ? LEFT EYE LASER RETINA REPAIR  SEPT 2012  ? RIGHT EYE VITRECTOMY/ INSERTION GLAUCOMA SETON/ LASER REPAIR  12-13-2008  ? RETINAL ARTERY OCCLUSION /NEOVASCULAR GLAUCOMA/ HEMORRHAGE  ? RIGHT EYE VITRETOMY/ INSERTION GLAUCOMA SETON X2/ LASER  03-24-2009  ? RECURRENT HEMORRHAGE/ OCCLUSION INTERNAL SETON  ? SHOULDER ARTHROSCOPY Right 2005  ? undescended right testicle removed  1994  ? ?Patient Active Problem List  ? Diagnosis Date Noted  ? Diabetes mellitus ([place]) 07/14/2020  ? Hyperlipidemia 07/14/2020  ? Osteoarthritis 07/14/2020  ? Plantar fasciitis 07/14/2020  ? Stasis edema of both lower extremities 01/01/2019  ? Primary osteoarthritis of left shoulder 03/20/2018  ? 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 Complex regional pain syndrome I of upper limb 06/27/2015  ? Dysphagia following cerebral infarction 02/15/2015  ? 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 CKD stage 2 due to type 2 diabetes mellitus ([place]) 02/12/2015  ? Right hemiparesis ([place])   ? Type 2 diabetes mellitus with diabetic neuropathy ([place]) 02/10/2015  ? Neck pain   ?  Numbness and tingling 02/09/2015  ? Left shoulder pain 07/17/2012  ? Plantar fasciitis, bilateral 07/17/2012  ? Peripheral neuropathy 11/06/2007  ? 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  ? Essential hypertension 07/08/2007  ? EXTRINSIC ASTHMA, UNSPECIFIED 06/25/2007  ? ? ?ONSET DATE: *** ? ?REFERRING DIAG: Gait instability ? ?THERAPY DIAG:  ?No diagnosis found. ? ?SUBJECTIVE:  ?                                                                                                                                                                                           ? ?SUBJECTIVE STATEMENT: ?*** ?Pt accompanied by: {accompnied:27141} ? ?PERTINENT HISTORY: *** ? ?PAIN:  ?  Are you having pain? {OPRCPAIN:27236} ? ?PRECAUTIONS: {Therapy precautions:24002} ? ?WEIGHT BEARING RESTRICTIONS {Yes ***/No:24003} ? ?FALLS: Has patient fallen in last 6 months? {fallsyesno:27318} ? ?LIVING ENVIRONMENT: ?Lives with: {[facility] lives with:25569::"lives with their family"} ?Lives in: {Lives in:25570} ?Stairs: {opstairs:27293} ?Has following equipment at home: {Assistive devices:23999} ? ?PLOF: {PLOF:24004} ? ?PATIENT GOALS *** ? ?OBJECTIVE:  ? ?DIAGNOSTIC FINDINGS: none recent ? ?COGNITION: ?Overall cognitive status: {cognition:24006} ?  ?SENSATION: ?{sensation:27233} ? ?COORDINATION: ?*** ? ?EDEMA:  ?{edema:24020} ? ?MUSCLE TONE: {LE tone:25568} ? ? ?MUSCLE LENGTH: ?Hamstrings: Right *** deg; Left *** deg ?Thomas test: Right *** deg; Left *** deg ? ?DTRs:  ?{DTR SITE:24025} ? ?POSTURE: {posture:25561} ? ?LE ROM:    ? ?Active  Right ?01/01/22 Left ?01/01/22  ?Hip flexion    ?Hip extension    ?Hip abduction    ?Hip adduction    ?Hip internal rotation    ?Hip external rotation    ?Knee flexion    ?Knee extension    ?Ankle dorsiflexion    ?Ankle plantarflexion    ?Ankle inversion    ?Ankle eversion    ? (Blank rows = not tested) ? ?MMT:   ? ?MMT Right ?01/01/22 Left ?01/01/22  ?Hip flexion    ?Hip extension    ?Hip abduction    ?Hip adduction    ?Hip  internal rotation    ?Hip external rotation    ?Knee flexion    ?Knee extension    ?Ankle dorsiflexion    ?Ankle plantarflexion    ?Ankle inversion    ?Ankle eversion    ?(Blank rows = not tested) ? ?BED MOBILITY:  ?{Bed mobility:24027} ? ?TRANSFERS: ?Assistive device utilized: {Assistive devices:23999}  ?Sit to stand: {Levels of assistance:24026} ?Stand to sit: {Levels of assistance:24026} ?Chair to chair: {Levels of assistance:24026} ?Floor: {Levels of assistance:24026} ? ?RAMP:  ?Level of Assistance: {Levels of assistance:24026} ?Assistive device utilized: {Assistive devices:23999} ?Ramp Comments: *** ? ?CURB:  ?Level of Assistance: {Levels of assistance:24026} ?Assistive device utilized: {Assistive devices:23999} ?Curb Comments: *** ? ?STAIRS: ? Level of Assistance: {Levels of assistance:24026} ? Stair Negotiation Technique: {Stair Technique:27161} with {Rail Assistance:27162} ? Number of Stairs: ***  ? Height of Stairs: ***  ?Comments: *** ? ?GAIT: ?Gait pattern: {gait characteristics:25376} ?Distance walked: *** ?Assistive device utilized: {Assistive devices:23999} ?Level of assistance: {Levels of assistance:24026} ?Comments: *** ? ?FUNCTIONAL TESTs:  ?5 times sit to stand: *** ?Timed up and go (TUG): *** ?Berg Balance Scale: *** ? ?PATIENT SURVEYS:  ?{rehab surveys:24030} ? ?TODAY'S TREATMENT:  ? ? ? ?PATIENT EDUCATION: ?Education details: prognosis, POC, HEP ?Person educated: Patient ?Education method: Explanation ?Education comprehension: verbalized understanding ? ? ?HOME EXERCISE PROGRAM: ?*** ? ? ?GOALS: ?Goals reviewed with patient? Yes ? ?SHORT TERM GOALS: Target date: {follow up:25551} ? ?Patient to be independent with initial HEP. ?Baseline: HEP initiated ?Goal status: INITIAL ? ? ? ?LONG TERM GOALS: Target date: {follow up:25551} ? ?Patient to be independent with advanced HEP. ?Baseline: Not yet initiated  ?Goal status: INITIAL ? ?Patient to demonstrate B LE strength >/=4+/5.  ?Baseline: See  above ?Goal status: INITIAL ? ?Patient to demonstrate *** ROM WFL and without pain limiting.  ?Baseline: *** ?Goal status: INITIAL ? ?Patient to report and demonstrate improved head, neck, and shoulder posture at rest and with activity.  ?Baseline: *** ?Goal status: INITIAL ? ?Patient to demonstrate alternating reciprocal pattern when ascending and descending stairs with good stability and 1 handrail as needed.   ?Baseline: Unable ?Goal status: INITIAL ? ?Patient to score at least 20/24  on DGI in order to decrease risk of falls.  ?Baseline: *** ?Goal status: INITIAL ? ?Patient to complete TUG in <14 sec with LRAD in order to decrease risk of falls.   ?Baseline: *** ?Goal status: INITIAL ? ?Patient to demonstrate 5xSTS test in <15 sec in order to decrease risk of falls.  ?Baseline: *** ?Goal status: INITIAL ? ?Patient to score at least ***/56 on Berg in order to decrease risk of falls.  ?Baseline: *** ?Goal status: INITIAL ? ? ?ASSESSMENT: ? ?CLINICAL IMPRESSION: ? ?Patient is a 71 y/o M presenting to OPPT with c/o *** for the past *** ? ?Patient today presenting with ***.  ? ? ?Patient was educated on gentle *** HEP and reported understanding. Prior to current episode, patient was independent. Would benefit from skilled PT services *** x/week for *** weeks to address aforementioned impairments in order to optimize level of function.  ? ? ? ?OBJECTIVE IMPAIRMENTS {opptimpairments:25111}.  ? ?ACTIVITY LIMITATIONS {activity limitations:25113}.  ? ?PERSONAL FACTORS {Personal factors:25162} are also affecting patient's functional outcome.  ? ? ?REHAB POTENTIAL: {rehabpotential:25112} ? ?CLINICAL DECISION MAKING: {clinical decision making:25114} ? ?EVALUATION COMPLEXITY: {Evaluation complexity:25115} ? ?PLAN: ?PT FREQUENCY: {rehab frequency:25116} ? ?PT DURATION: {rehab duration:25117} ? ?PLANNED INTERVENTIONS: {rehab planned interventions:25118::"Therapeutic exercises","Therapeutic activity","Neuromuscular  re-education","Balance training","Gait training","Patient/Family education","Joint mobilization"} ? ?PLAN FOR NEXT SESSION: *** ? ? ?Manuela Neptune, PT ?12/28/2021, 3:22 PM ? ? ? ? ? ? ? ?

## 2021-12-28 NOTE — Progress Notes (Signed)
Call to patient per MP to see if he was ok with waiting to start his increased dose of Chlorthalidone ( 25 mg) in his package delivery on next week. Patient reports he was fine with that. ? ? ? ?Ned Clines CMA ?Clinical Pharmacist Assistant ?[phone] ? ? ? ?

## 2021-12-29 ENCOUNTER — Telehealth: Payer: Self-pay

## 2021-12-29 MED ORDER — LOSARTAN POTASSIUM 100 MG PO TABS: 100.0000 mg | ORAL_TABLET | Freq: Every morning | ORAL | 1 refills | Status: DC

## 2021-12-29 NOTE — Telephone Encounter (Signed)
-----   Message from Viona Gilmore, [facility] sent at 12/28/2021  4:34 PM EDT ----- ?Regarding: Losartan refill ?Hi, ? ?It looks like the losartan was just sent in for a 30 days supply to the pharmacy but he usually gets 90 ds of his medications - can you please resend for a 90 days supply to [facility]? ? ?Thanks! ?Maddie ? ?

## 2021-12-31 DIAGNOSIS — E1140 Type 2 diabetes mellitus with diabetic neuropathy, unspecified: Secondary | ICD-10-CM

## 2021-12-31 DIAGNOSIS — I10 Essential (primary) hypertension: Secondary | ICD-10-CM

## 2021-12-31 DIAGNOSIS — Z794 Long term (current) use of insulin: Secondary | ICD-10-CM

## 2021-12-31 DIAGNOSIS — E785 Hyperlipidemia, unspecified: Secondary | ICD-10-CM

## 2022-01-01 ENCOUNTER — Encounter: Payer: Self-pay | Admitting: Physical Therapy

## 2022-01-01 ENCOUNTER — Telehealth: Payer: Self-pay | Admitting: Physical Therapy

## 2022-01-01 ENCOUNTER — Ambulatory Visit: Payer: HMO | Attending: Adult Health | Admitting: Physical Therapy

## 2022-01-01 DIAGNOSIS — M6281 Muscle weakness (generalized): Secondary | ICD-10-CM | POA: Insufficient documentation

## 2022-01-01 DIAGNOSIS — R2689 Other abnormalities of gait and mobility: Secondary | ICD-10-CM | POA: Insufficient documentation

## 2022-01-01 DIAGNOSIS — R29818 Other symptoms and signs involving the nervous system: Secondary | ICD-10-CM | POA: Insufficient documentation

## 2022-01-01 DIAGNOSIS — R2681 Unsteadiness on feet: Secondary | ICD-10-CM | POA: Diagnosis not present

## 2022-01-01 NOTE — Telephone Encounter (Signed)
Hello, ? ?Mr. Brisk was evaluated today in OPPT for gait instability per your referral.  The patient would benefit from OT evaluation for R hand weakness.   ?If you agree, please place an order in [facility]que in [facility] or fax the order to [phone]. ? ?Thank you, ?Janene Harvey, PT, DPT ?01/01/22 2:18 PM ? ? ?[facility] ?[address] ?[place], [place]  [address] ?Phone:  [phone] ?Fax:  [phone]  ?

## 2022-01-01 NOTE — Therapy (Signed)
?OUTPATIENT PHYSICAL THERAPY NEURO EVALUATION ? ? ?Patient Name: Russell Austin ?MRN: [id number] ?DOB:[birth year], 71 y.o., male ?Today's Date: 01/01/2022 ? ?PCP: [name], NP ?REFERRING PROVIDER: [name], NP ? ? PT End of Session - 01/01/22 0856   ? ? Visit Number 1   ? Number of Visits 17   ? Date for PT Re-Evaluation 02/26/22   ? Authorization Type [employer]   ? PT Start Time [phone]   ? PT Stop Time 0845   ? PT Time Calculation (min) 38 min   ? Equipment Utilized During Treatment Gait belt   ? Activity Tolerance Patient tolerated treatment well   ? Behavior During Therapy [facility] for tasks assessed/performed   ? ?  ?  ? ?  ? ? ?Past Medical History:  ?Diagnosis Date  ? 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 Diabetes mellitus type II   ? Diabetic retinopathy FOLLOWED BY DR Zadie Rhine  ? ED (erectile dysfunction)   ? Glaucoma of both eyes   ? Hypertension   ? Left hydrocele   ? Stroke [facility])   ? ?Past Surgical History:  ?Procedure Laterality Date  ? APPENDECTOMY  AGE EARLY 20'S  ? COLONOSCOPY  12/30/2020  ? every 5 years  ? HYDROCELE EXCISION  03/31/2012  ? Procedure: HYDROCELECTOMY ADULT;  Surgeon: Franchot Gallo, MD;  Location: [facility];  Service: Urology;  Laterality: Left;  45 MINS ? ?  ? LEFT EYE LASER RETINA REPAIR  SEPT 2012  ? RIGHT EYE VITRECTOMY/ INSERTION GLAUCOMA SETON/ LASER REPAIR  12-13-2008  ? RETINAL ARTERY OCCLUSION /NEOVASCULAR GLAUCOMA/ HEMORRHAGE  ? RIGHT EYE VITRETOMY/ INSERTION GLAUCOMA SETON X2/ LASER  03-24-2009  ? RECURRENT HEMORRHAGE/ OCCLUSION INTERNAL SETON  ? SHOULDER ARTHROSCOPY Right 2005  ? undescended right testicle removed  1994  ? ?Patient Active Problem List  ? Diagnosis Date Noted  ? Diabetes mellitus ([place]) 07/14/2020  ? Hyperlipidemia 07/14/2020  ? Osteoarthritis 07/14/2020  ? Plantar fasciitis 07/14/2020  ? Stasis edema of both lower extremities 01/01/2019  ? Primary osteoarthritis of left shoulder 03/20/2018  ? Spondylosis of  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 myelopathy or radiculopathy 03/20/2018  ? Complex regional pain syndrome I of upper limb 06/27/2015  ? Dysphagia following cerebral infarction 02/15/2015  ? 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 CKD stage 2 due to type 2 diabetes mellitus ([place]) 02/12/2015  ? Right hemiparesis ([place])   ? Type 2 diabetes mellitus with diabetic neuropathy ([place]) 02/10/2015  ? Neck pain   ? Numbness and tingling 02/09/2015  ? Left shoulder pain 07/17/2012  ? Plantar fasciitis, bilateral 07/17/2012  ? Peripheral neuropathy 11/06/2007  ? 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  ? Essential hypertension 07/08/2007  ? EXTRINSIC ASTHMA, UNSPECIFIED 06/25/2007  ? ? ?ONSET DATE: stroke in 2016, but having more issues in the past couple of months ? ?REFERRING DIAG: Gait instability ? ?THERAPY DIAG:  ?Unsteadiness on feet ? ?Other abnormalities of gait and mobility ? ?Muscle weakness (generalized) ? ?Other symptoms and signs involving the nervous system ? ?SUBJECTIVE:  ?                                                                                                                                                                                           ? ?  SUBJECTIVE STATEMENT: ?Patient reports that for the past couple of months he feels that his muscles have tightened back up since his stroke in 2016. Affected his whole R side and reports that now his R arm and leg are shorter. Reports trouble gripping things and dropping things. Had inpatient rehab and OPPT at [facility] [place]. after his stroke. Does not use anything to walk- wife cites "vanity." Does not have AFO. Reports 2 falls in the past 6 months- was able to get up using a chair.  ?Pt accompanied by:  wife ? ?PERTINENT HISTORY: R eye blindness, DM II, diabetic retinopathy, HTN, stroke 2016, R shoulder scope 2005 ? ?PAIN:  ?Are you having pain? Yes: NPRS scale: 8/10 ?Pain location: R shoulder and hand ?Pain description: dull,  constant ?Aggravating factors: constant ?Relieving factors: nothing ? ?PRECAUTIONS: Fall ? ?WEIGHT BEARING RESTRICTIONS No ? ?FALLS: Has patient fallen in last 6 months? Yes. Number of falls 2 ? ?LIVING ENVIRONMENT: ?Lives with: lives with their spouse ?Lives in: House/apartment ?Stairs: Yes: Internal: 14 steps; on left going up and External: 2 steps; on right going up and on left going up ?Has following equipment at home: None ? ?PLOF: Independent (wife now assists with buttons) ? ?PATIENT GOALS "be able to balance myself and pick up my foot" ? ?OBJECTIVE:  ? ?DIAGNOSTIC FINDINGS: none recent ? ?COGNITION: ?Overall cognitive status: Within functional limits for tasks assessed ?  ?SENSATION: ?WFL ? ?COORDINATION: ?NT ? ? ? ?MUSCLE TONE: RLE: mild-moderate increased tone in R quads ? ? ? ?POSTURE: rounded shoulders, forward head, and increased thoracic kyphosis ? ?LE ROM:    ? ?Active  Right ?01/01/22 Left ?01/01/22  ?Hip flexion    ?Hip extension    ?Hip abduction    ?Hip adduction    ?Hip internal rotation    ?Hip external rotation    ?Knee flexion    ?Knee extension    ?Ankle dorsiflexion -3 13  ?Ankle plantarflexion    ?Ankle inversion    ?Ankle eversion    ? (Blank rows = not tested) ? ?MMT:   ? ?MMT Right ?01/01/22 Left ?01/01/22  ?Hip flexion 3+ 4+  ?Hip extension    ?Hip abduction 3+ 4+  ?Hip adduction 3+ 4+  ?Hip internal rotation    ?Hip external rotation    ?Knee flexion 3+ 5  ?Knee extension 4 5  ?Ankle dorsiflexion 3 4  ?Ankle plantarflexion 2 4  ?Ankle inversion    ?Ankle eversion    ?(Blank rows = not tested) ? ? ? ?GAIT: ?Gait pattern: step to pattern, decreased step length- Left, decreased stance time- Right, decreased hip/knee flexion- Right, decreased ankle dorsiflexion- Right, Right hip hike, Right foot flat, trunk flexed, and poor foot clearance- Right ?Off balance and requiring wife to assist in stabilizing  ? ?Assistive device utilized: None ? ? ?FUNCTIONAL TESTs:  ?5 times sit to stand: 25.12  sec pushing off B from chair without armrests; 1 episode of LOB upon standing from STS requiring PT and wife's support to recover  ?Timed up and go (TUG): 21.89 sec with heavy CGA ?Berg Balance Scale:   ? ? ? ?TODAY'S TREATMENT:  ? ? ? ?PATIENT EDUCATION: ?Education details: prognosis, POC, HEP, edu on benefits of AFO and cane to improve gait stability and efficiency, edu on scope of OT and agreed on requesting OPOT referral ?Person educated: Patient, wife ?Education method: Explanation, handout ?Education comprehension: verbalized understanding ? ? ?HOME EXERCISE PROGRAM: ?Access Code: [email or web address] ?  URL: [email or web address] ?Date: 01/01/2022 ?Prepared by: [facility] ? ?Exercises ?- Sit to Stand with Counter Support  - 1 x daily - 5 x weekly - 2 sets - 5 reps ?- Seated Long Arc Quad  - 1 x daily - 5 x weekly - 2 sets - 10 reps ?- Standing March with Counter Support  - 1 x daily - 5 x weekly - 2 sets - 10 reps ?- Gastroc Stretch on Wall  - 1 x daily - 5 x weekly - 2 sets - 30 sec hold ? ? ?GOALS: ?Goals reviewed with patient? Yes ? ?SHORT TERM GOALS: Target date: 01/29/2022 ? ?Patient to be independent with initial HEP. ?Baseline: HEP initiated ?Goal status: INITIAL ? ? ? ?LONG TERM GOALS: Target date: 02/26/2022 ? ?Patient to be independent with advanced HEP. ?Baseline: Not yet initiated  ?Goal status: INITIAL ? ? ?Patient to demonstrate neutral R ankle ROM to improve comfort wearing AFO during gait.  ?Baseline: -3 deg ?Goal status: INITIAL ? ? ?Patient to complete TUG in <16 sec with LRAD in order to decrease risk of falls.   ?Baseline: 21.89 sec ?Goal status: INITIAL ? ?Patient to demonstrate 5xSTS test in <15 sec in order to decrease risk of falls.  ?Baseline: 25.12 sec ?Goal status: INITIAL ? ?Patient to score at least 40/56 on Berg in order to decrease risk of falls.  ?Baseline: NT ?Goal status: INITIAL ? ?Patient to obtain AFO for improved safety and ease with  ambulation. ?Baseline: N/A ?Goal status: INITIAL ? ? ?ASSESSMENT: ? ?CLINICAL IMPRESSION: ? ?Patient is a 72 y/o M presenting to OPPT with c/o imbalance and difficulty with gait for the past couple of months. Of note, pat

## 2022-01-02 ENCOUNTER — Other Ambulatory Visit: Payer: Self-pay | Admitting: Adult Health

## 2022-01-02 DIAGNOSIS — G8191 Hemiplegia, unspecified affecting right dominant side: Secondary | ICD-10-CM

## 2022-01-02 NOTE — Therapy (Addendum)
OUTPATIENT PHYSICAL THERAPY TREATMENT NOTE   Patient Name: Russell Austin MRN: [id number] DOB:1950-10-28, 71 y.o., male 38 Date: 01/03/2022  PCP: [name], NP  REFERRING PROVIDER: [name], NP    Progress Note Reporting Period 01/01/22 to 01/03/22  See note below for Objective Data and Assessment of Progress/Goals.      END OF SESSION:   PT End of Session - 01/03/22 0758     Visit Number 2    Number of Visits 17    Date for PT Re-Evaluation 02/26/22    Authorization Type [employer]    PT Start Time 0800    PT Stop Time 0843    PT Time Calculation (min) 43 min    Equipment Utilized During Treatment Gait belt    Activity Tolerance Patient tolerated treatment well    Behavior During Therapy WFL for tasks assessed/performed             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   Diabetes mellitus type II    Diabetic retinopathy FOLLOWED BY DR Zadie Rhine   ED (erectile dysfunction)    Glaucoma of both eyes    Hypertension    Left hydrocele    Stroke [facility])    Past Surgical History:  Procedure Laterality Date   APPENDECTOMY  AGE EARLY 20'S   COLONOSCOPY  12/30/2020   every 5 years   HYDROCELE EXCISION  03/31/2012   Procedure: HYDROCELECTOMY ADULT;  Surgeon: Franchot Gallo, MD;  Location: [facility];  Service: Urology;  Laterality: Left;  57 MINS     LEFT EYE LASER RETINA REPAIR  SEPT 2012   RIGHT EYE VITRECTOMY/ INSERTION GLAUCOMA SETON/ LASER REPAIR  12-13-2008   RETINAL ARTERY OCCLUSION /NEOVASCULAR GLAUCOMA/ HEMORRHAGE   RIGHT EYE VITRETOMY/ INSERTION GLAUCOMA SETON X2/ LASER  03-24-2009   RECURRENT HEMORRHAGE/ OCCLUSION INTERNAL SETON   SHOULDER ARTHROSCOPY Right 2005   undescended right testicle removed  1994   Patient Active Problem List   Diagnosis Date Noted   Diabetes mellitus ([place]) 07/14/2020   Hyperlipidemia 07/14/2020   Osteoarthritis 07/14/2020    Plantar fasciitis 07/14/2020   Stasis edema of both lower extremities 01/01/2019   Primary osteoarthritis of left shoulder 03/20/2018   Spondylosis of 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 myelopathy or radiculopathy 03/20/2018   Complex regional pain syndrome I of upper limb 06/27/2015   Dysphagia following cerebral infarction 02/15/2015   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)    CKD stage 2 due to type 2 diabetes mellitus ([place]) 02/12/2015   Right hemiparesis ([place])    Type 2 diabetes mellitus with diabetic neuropathy (HCC) 02/10/2015   Neck pain    Numbness and tingling 02/09/2015   Left shoulder pain 07/17/2012   Plantar fasciitis, bilateral 07/17/2012   Peripheral neuropathy 11/06/2007   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   Essential hypertension 07/08/2007   EXTRINSIC ASTHMA, UNSPECIFIED 06/25/2007    REFERRING DIAG: Gait instability   THERAPY DIAG:  Unsteadiness on feet  Other abnormalities of gait and mobility  Muscle weakness (generalized)  Other symptoms and signs involving the nervous system  PERTINENT HISTORY: R eye blindness, DM II, diabetic retinopathy, HTN, stroke 2016, R shoulder scope 2005   PRECAUTIONS: Fall   SUBJECTIVE: Feeling a lot better than he was yesterday- had a stomachache. Denies questions on HEP.   PAIN:  Are you having pain? Yes: NPRS scale: 7/10 Pain location: R hand radiating to shoulder and  in R LE Pain description: dull Aggravating factors: spontaneous Relieving factors: nothing   TODAY'S TREATMENT:  Berg Balance Scale:  Sitting to Standing (4): able to stand w/o using hands and stabilize I (3): able to stand I using hands (2): able to stand using hands after several tries (1): needs Min aid to stand or stabilize (0): needs ModA to MaxA to stand SCORE: 4  Standing Unsupported (4): able to stand safely for 2 mins w/o holding on (3): able to stand 2 mins w/supervision (2): able to stand 30 secs. Unsupported (1): needs several  tries to stand 30 secs unsupported (0): unable to stand 30 secs unsupported SCORE: 4  Sitting Unsupported (4): able to sit safely and securely for 2 mins (3): able to sit 2 mins w/supervision (2): able to sit 30 secs (1): able to sit 10 secs (0): unable to sit unsupported SCORE: 4  Standing to Sitting (4): sits safely w/min use of hands (3): controls descent w/hands (2): uses backs of legs against chair to control descent (1): sits I but has uncontrolled descent (0): needs assistance to sit SCORE: 3  Transfers (4): able to transfer safely w/minor use of hands (3): able to transfer safely definite need of hands (2): able to transfer w/verbal cues and/or supervision (1): needs 1 person to assist (0): needs 2 people to assist or supervise to be safe SCORE: 4  Standing Unsupported w/ Eyes Closed (4): able to stand 10 secs safely (3): able to stand 10 secs w/supervision (2): able to stand 3 secs (1): unable to keep eyes closed 3 secs but stays safely (0): needs help to keep from falling SCORE: 4  Standing Unsupported w/Feet Together (4): able to place feet together I and stand 1 min safely (3): able to place feet together I and stand 1 min w/supervision (2): able to place feet together I but unable to hold 30 secs (1): needs help to attain position but able to stand 15 secs feet together (0): needs help to attain position and unable to hold for 15 secs SCORE: 4  Reaching Fwd while Standing (4): reach fwd 25cm/10in (3): reach fwd 12cm/5in (2): reach fwd 5cm/2in (1): reach fwd but needs supervision (0): loses balance while trying/requires external support SCORE: 3  Pick Up Object from Floor while Standing (4): able to pick up safely and easily (3): able to pick up but needs supervision (2): unable to pick up but reaches 1-2 in from object and keeps balance I (1): unable to pick up and needs supervision while trying (0): needs assist to keep from losing balance or falling SCORE: 3   Turning to Look Behind L and R Shoulders Standing (4): looks behind from both sides and weight shifts well (3): looks behind one side only other side shows less weight shift (2): turns sideways only but maintains balance (1): needs supervision while turning (0): needs assistance to keep from losing balance or falling SCORE: 2  Turn 360 Degrees (4): able to turn 360 degrees safely in <4 secs (3): able to turn 360 degrees safely on one side only 4 secs or less (2): able to turn 360 degrees safely but slowly (1): needs close supervision or verbal cues (0): needs assistance while turning SCORE: 2  Stair Taps (4): able to stand I and safely and complete 8 steps in 20 secs (3): able to stand I and complete 8 steps in >20 seconds (2): able to complete 4 steps w/o aid w/supervision  (1): able to  complete >2 steps needs MinA (0): needs assistance to keep from falling/unable to try SCORE: 0  Standing Unsupported One Foot in Front (tandem) (4): able to place foot tandem I and hold 30 seconds (3): able to place foot ahead I and hold 30 seconds (2): able to take small step I and hold 30 seconds (1): needs help to step but can hold 15 seconds (0): loses balance while stepping or standing SCORE: 4  Standing on One Leg (4): able to lift leg I and hold>10 seconds (3): able to lift leg I and hold 5-10 seconds (2): able to lift leg I and hold >3 seconds (1): tries to left leg unable to hold 3 secs but remains standing I (0): unable to try/needs assistance to prevent fall SCORE: 1   Score: 39/56     Activity Comments  Sit to Stand without Ues 5x  Cues for set up  Gait training with SPC  and SPC with quad tip 83f, 842fGood carryover of sequencing and much more smooth and steady gait pattern however still with R knee stuck in extension and with L foot in eversion and scissoring   Prayer stretch with green pball 5x3" forward and L diagonal Report of good relief of back pain     PATIENT  EDUCATION: [facility]: [facility]RL: [email or web address] Date: 01/03/2022 [facility]  Exercises - Sit to Stand Without Arm Support  - 1 x daily - 5 x weekly - 2 sets - 5 reps - Seated Long Arc Quad  - 1 x daily - 5 x weekly - 2 sets - 10 reps - Standing March with Counter Support  - 1 x daily - 5 x weekly - 2 sets - 10 reps - Gastroc Stretch on Wall  - 1 x daily - 5 x weekly - 2 sets - 30 sec hold - Seated Flexion Stretch with Swiss Ball  - 1 x daily - 5 x weekly - 2 sets - 5 reps - 3 sec hold - Seated Thoracic Flexion and Rotation with Swiss Ball  - 1 x daily - 5 x weekly - 2 sets - 5 reps - 3 sec hold  Education details: HEP update Person educated: Patient Education method: Explanation, Demonstration, and Handouts Education comprehension: verbalized understanding    OBJECTIVE: (objective measures completed at initial evaluation unless otherwise dated)   LE ROM:      Active  Right 01/01/22 Left 01/01/22  Hip flexion      Hip extension      Hip abduction      Hip adduction      Hip internal rotation      Hip external rotation      Knee flexion      Knee extension      Ankle dorsiflexion -3 13  Ankle plantarflexion      Ankle inversion      Ankle eversion       (Blank rows = not tested)   MMT:     MMT Right 01/01/22 Left 01/01/22  Hip flexion 3+ 4+  Hip extension      Hip abduction 3+ 4+  Hip adduction 3+ 4+  Hip internal rotation      Hip external rotation      Knee flexion 3+ 5  Knee extension 4 5  Ankle dorsiflexion 3 4  Ankle plantarflexion 2 4  Ankle inversion      Ankle eversion      (  Blank rows = not tested)       GAIT: Gait pattern: step to pattern, decreased step length- Left, decreased stance time- Right, decreased hip/knee flexion- Right, decreased ankle dorsiflexion- Right, Right hip hike, Right foot flat, trunk flexed, and poor foot clearance- Right Off balance and requiring  wife to assist in stabilizing    Assistive device utilized: None     FUNCTIONAL TESTs:  5 times sit to stand: 25.12 sec pushing off B from chair without armrests; 1 episode of LOB upon standing from STS requiring PT and wife's support to recover  Timed up and go (TUG): 21.89 sec with heavy CGA Berg Balance Scale:       PATIENT EDUCATION: Education details: prognosis, POC, HEP, edu on benefits of AFO and cane to improve gait stability and efficiency, edu on scope of OT and agreed on requesting OPOT referral Person educated: Patient, wife Education method: Explanation, handout Education comprehension: verbalized understanding     HOME EXERCISE PROGRAM: Access Code: [facility] URL: [email or web address] Date: 01/01/2022 Prepared by: [facility]   Exercises - Sit to Stand with Counter Support  - 1 x daily - 5 x weekly - 2 sets - 5 reps - Seated Long Arc Quad  - 1 x daily - 5 x weekly - 2 sets - 10 reps - Standing March with Counter Support  - 1 x daily - 5 x weekly - 2 sets - 10 reps - Gastroc Stretch on Wall  - 1 x daily - 5 x weekly - 2 sets - 30 sec hold     GOALS: Goals reviewed with patient? Yes   SHORT TERM GOALS: Target date: 01/29/2022   Patient to be independent with initial HEP. Baseline: HEP initiated Goal status: ongoing       LONG TERM GOALS: Target date: 02/26/2022   Patient to be independent with advanced HEP. Baseline: Not yet initiated  Goal status: ongoing     Patient to demonstrate neutral R ankle ROM to improve comfort wearing AFO during gait.  Baseline: -3 deg Goal status: ongoing     Patient to complete TUG in <16 sec with LRAD in order to decrease risk of falls.   Baseline: 21.89 sec Goal status: ongoing   Patient to demonstrate 5xSTS test in <15 sec in order to decrease risk of falls.  Baseline: 25.12 sec Goal status: ongoing   Patient to score at least 40/56 on Berg in order to decrease  risk of falls.  Baseline: NT Goal status: ongoing   Patient to obtain AFO for improved safety and ease with ambulation. Baseline: N/A Goal status: ongoing     ASSESSMENT:   CLINICAL IMPRESSION:   Patient arrived to session with wife with report of compliance with HEP and denied questions. Patient scored 39/56 on Berg, indicating an increased risk of falls.  Demonstrated good static balance, but more difficulty with dynamic balance. Patient reported some LBP today which was addressed with gentle stretching- patient reported good relief. Initiated gait training with cane, patient noted preference for quad tip cane and able to demonstrate smooth continuous gait patient with good AD sequencing and improved stability. Still with some remaining gait deviations d/t extensor tone in R LE. Patient reported understanding of HEP update and without complaints at end of session.        OBJECTIVE IMPAIRMENTS Abnormal gait, decreased activity tolerance, decreased balance, decreased coordination, decreased endurance, decreased knowledge of use of DME, difficulty walking, decreased ROM,  decreased strength, decreased safety awareness, increased muscle spasms, impaired flexibility, impaired tone, impaired vision/preception, improper body mechanics, and postural dysfunction.    ACTIVITY LIMITATIONS cleaning, community activity, meal prep, laundry, shopping, yard work, and church.    PERSONAL FACTORS Age, Behavior pattern, Fitness, Past/current experiences, Time since onset of injury/illness/exacerbation, Transportation, and 3+ comorbidities: R eye blindness, DM II, diabetic retinopathy, HTN, stroke 2016, R shoulder scope 2005  are also affecting patient's functional outcome.      REHAB POTENTIAL: Good   CLINICAL DECISION MAKING: Evolving/moderate complexity   EVALUATION COMPLEXITY: Moderate   PLAN: PT FREQUENCY: 2x/week   PT DURATION: 8 weeks   PLANNED INTERVENTIONS: Therapeutic exercises, Therapeutic  activity, Neuromuscular re-education, Balance training, Gait training, Patient/Family education, Joint mobilization, Stair training, Vestibular training, Canalith repositioning, Visual/preceptual remediation/compensation, Orthotic/Fit training, DME instructions, Aquatic Therapy, Dry Needling, Electrical stimulation, Cryotherapy, Moist heat, Taping, Ultrasound, and Manual therapy   PLAN FOR NEXT SESSION: reassess HEP. Progress dynamic balance, continue gait with cane      Janene Harvey, PT, DPT 01/03/22 9:31 AM     PHYSICAL THERAPY DISCHARGE SUMMARY  Visits from Start of Care: 2  Current functional level related to goals / functional outcomes: Unable to assess patient requested d/t sustaining a fall with injuries    Remaining deficits: Unable to assess   Education / Equipment: HEP  Plan: Patient agrees to discharge.  Patient goals were not met. Patient is being discharged due to his request.     Janene Harvey, PT, DPT 02/19/22 3:58 PM [facility] at [facility], [place], [place] [address] Phone # [phone] Fax # [phone]

## 2022-01-03 ENCOUNTER — Encounter: Payer: Self-pay | Admitting: Physical Therapy

## 2022-01-03 ENCOUNTER — Ambulatory Visit: Payer: HMO | Admitting: Physical Therapy

## 2022-01-03 DIAGNOSIS — M6281 Muscle weakness (generalized): Secondary | ICD-10-CM

## 2022-01-03 DIAGNOSIS — R2681 Unsteadiness on feet: Secondary | ICD-10-CM

## 2022-01-03 DIAGNOSIS — R29818 Other symptoms and signs involving the nervous system: Secondary | ICD-10-CM

## 2022-01-03 DIAGNOSIS — R2689 Other abnormalities of gait and mobility: Secondary | ICD-10-CM

## 2022-01-03 NOTE — Progress Notes (Signed)
?Subjective:  ?Patient ID: Russell Austin, male    DOB: 30-Apr-1951,  MRN: [id number] ? ?Russell Austin presents to clinic today for at risk foot care with history of diabetic neuropathy and callus(es) left lower extremity and painful thick toenails that are difficult to trim. Painful toenails interfere with ambulation. Aggravating factors include wearing enclosed shoe gear. Pain is relieved with periodic professional debridement. Painful calluses are aggravated when weightbearing with and without shoegear. Pain is relieved with periodic professional debridement. ? ?Last known HgA1c was 7.3%. Patient did not check blood glucose today. ? ?New problem(s): None. Patient is accompanied by his wife on today's visit. They are requesting assistance with obtaining a sole buildup on his shoe due to limb length discrepancy of the right lower extremity. ? ?PCP is [name], NP , and last visit was December 19, 2021. ? ?Allergies  ?Allergen Reactions  ? Lactose Intolerance (Gi) Diarrhea  ? Apple Pectin [Pectin] Itching  ?  ITCHY THROAT  ? Metformin And Related   ?  D/t decreased kidney function   ? Peach [Prunus Persica] Itching  ?  ITCHY THROAT  ? Morphine And Related Anxiety  ?  Jittery  ? ? ?Review of Systems: Negative except as noted in the HPI. ? ?Objective: No changes noted in today's physical examination. ? ?Constitutional Russell Austin is a pleasant 71 y.o. African American male, in NAD. AAO x 3.   ?Vascular Capillary refill time to digits immediate b/l. Palpable DP pulse(s) b/l LE. Faintly palpable PT pulse(s) b/l LE. Pedal hair present. Lower extremity skin temperature gradient within normal limits. +1 pitting edema noted BLE. No cyanosis or clubbing noted b/l LE.  ?Neurologic Normal speech. Oriented to person, place, and time. Pt has subjective symptoms of neuropathy. Protective sensation intact 5/5 intact bilaterally with 10g monofilament b/l.  ?Dermatologic No open wounds b/l LE. No interdigital  macerations noted b/l LE. Toenails 1-5 right and 1-4 left elongated, discolored, dystrophic, thickened, and crumbly with subungual debris and tenderness to dorsal palpation. Anonychia noted L 5th toe. Nailbed(s) epithelialized.  Hyperkeratotic lesion(s) plantar heel pad of left foot and submet head 5 left foot.  No erythema, no edema, no drainage, no fluctuance.  ?Orthopedic: Muscle strength 5/5 to all lower extremity muscle groups bilaterally. HAV with bunion deformity noted b/l LE. Pes planus deformity noted bilateral LE. LLD with short RLE.  ? ?Radiographs: None ? ?  Latest Ref Rng & Units 12/19/2021  ?  7:41 AM 09/08/2021  ?  7:12 AM 06/08/2021  ?  7:14 AM 03/10/2021  ?  7:17 AM  ?Hemoglobin A1C  ?Hemoglobin-A1c 4.6 - 6.5 % 7.7   7.1   6.4   7.5    ? ?Assessment/Plan: ?1. Pain due to onychomycosis of toenails of both feet   ?2. Callus   ?3. Lower limb length difference   ?4. Bilateral lower extremity edema   ?5. Type 2 diabetes mellitus with diabetic neuropathy, with long-term current use of insulin ([place])   ?  ? ?-Patient was evaluated and treated. All patient's and/or POA's questions/concerns answered on today's visit. ?-Patient to continue soft, supportive shoe gear daily. ?-Toenails 1-5 right foot and 1-4 left foot debrided in length and girth without iatrogenic bleeding with sterile nail nipper and dremel.  ?-Callus(es) plantar heel pad of left foot and submet head 5 left foot pared utilizing sterile scalpel blade without complication or incident. Total number debrided =2. ?-Rx written to [facility] for shoe buildup of right lower extremity  with diagnosis of limb length discrepancy. ?-Rx written for compression hose 15-20 mmHg, level knee high. ?-Patient/POA to call should there be question/concern in the interim.  ? ?Return in about 3 months (around 03/28/2022). ? ?Marzetta Board, DPM  ?

## 2022-01-05 NOTE — Therapy (Incomplete)
?OUTPATIENT PHYSICAL THERAPY TREATMENT NOTE ? ? ?Patient Name: Russell Austin ?MRN: [id number] ?DOB:03-Jul-1951, 71 y.o., male ?Today's Date: 01/05/2022 ? ?PCP: [name], NP  ?REFERRING PROVIDER: [name], NP  ? ?END OF SESSION:  ? ? ? ?Past Medical History:  ?Diagnosis Date  ? 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 Diabetes mellitus type II   ? Diabetic retinopathy FOLLOWED BY DR Zadie Rhine  ? ED (erectile dysfunction)   ? Glaucoma of both eyes   ? Hypertension   ? Left hydrocele   ? Stroke [facility])   ? ?Past Surgical History:  ?Procedure Laterality Date  ? APPENDECTOMY  AGE EARLY 20'S  ? COLONOSCOPY  12/30/2020  ? every 5 years  ? HYDROCELE EXCISION  03/31/2012  ? Procedure: HYDROCELECTOMY ADULT;  Surgeon: Franchot Gallo, MD;  [facility];  Service: Urology;  Laterality: Left;  45 MINS ? ?  ? LEFT EYE LASER RETINA REPAIR  SEPT 2012  ? RIGHT EYE VITRECTOMY/ INSERTION GLAUCOMA SETON/ LASER REPAIR  12-13-2008  ? RETINAL ARTERY OCCLUSION /NEOVASCULAR GLAUCOMA/ HEMORRHAGE  ? RIGHT EYE VITRETOMY/ INSERTION GLAUCOMA SETON X2/ LASER  03-24-2009  ? RECURRENT HEMORRHAGE/ OCCLUSION INTERNAL SETON  ? SHOULDER ARTHROSCOPY Right 2005  ? undescended right testicle removed  1994  ? ?Patient Active Problem List  ? Diagnosis Date Noted  ? Diabetes mellitus ([place]) 07/14/2020  ? Hyperlipidemia 07/14/2020  ? Osteoarthritis 07/14/2020  ? Plantar fasciitis 07/14/2020  ? Stasis edema of both lower extremities 01/01/2019  ? Primary osteoarthritis of left shoulder 03/20/2018  ? 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 Complex regional pain syndrome I of upper limb 06/27/2015  ? Dysphagia following cerebral infarction 02/15/2015  ? 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 CKD stage 2 due to type 2 diabetes mellitus ([place]) 02/12/2015  ? Right hemiparesis ([place])   ? Type 2 diabetes mellitus with diabetic neuropathy ([place])  02/10/2015  ? Neck pain   ? Numbness and tingling 02/09/2015  ? Left shoulder pain 07/17/2012  ? Plantar fasciitis, bilateral 07/17/2012  ? Peripheral neuropathy 11/06/2007  ? 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  ? Essential hypertension 07/08/2007  ? EXTRINSIC ASTHMA, UNSPECIFIED 06/25/2007  ? ? ?REFERRING DIAG: Gait instability  ? ?THERAPY DIAG:  ?No diagnosis found. ? ?PERTINENT HISTORY: R eye blindness, DM II, diabetic retinopathy, HTN, stroke 2016, R shoulder scope 2005  ? ?PRECAUTIONS: Fall  ? ?SUBJECTIVE: Feeling a lot better than he was yesterday- had a stomachache. Denies questions on HEP.  ? ?PAIN:  ?Are you having pain? Yes: NPRS scale: 7/10 ?Pain location: R hand radiating to shoulder and in R LE ?Pain description: dull ?Aggravating factors: spontaneous ?Relieving factors: nothing ? ? ? ?TODAY'S TREATMENT: 01/08/22 ?Activity Comments  ?nustep   ?STS R foot back (elevated?)   ?sitting LAQ   ?standing march   ?gastroc stretch   ?gait training with quad tip cane; work on turns ?trial AFO? ?   ? ? ? ? ? ?TREATMENT 01/03/22: ? ?Activity Comments  ?Sit to Stand without Ues 5x  Cues for set up  ?Gait training with SPC  and SPC with quad tip 32ft, 106ft Good carryover of sequencing and much more smooth and steady gait pattern however still with R knee stuck in extension and with L foot in eversion and scissoring   ?Prayer stretch with green pball 5x3" forward and L diagonal Report of good relief of back pain  ?  ? ?  PATIENT EDUCATION: ?Access Code: [email or web address] ?URL: [email or web address] ?Date: 01/03/2022 ?Prepared by: [facility] ? ?Exercises ?- Sit to Stand Without Arm Support  - 1 x daily - 5 x weekly - 2 sets - 5 reps ?- Seated Long Arc Quad  - 1 x daily - 5 x weekly - 2 sets - 10 reps ?- Standing March with Counter Support  - 1 x daily - 5 x weekly - 2 sets - 10 reps ?- Gastroc Stretch on Wall  - 1 x daily - 5 x weekly - 2 sets - 30 sec hold ?- Seated Flexion Stretch  with Swiss Ball  - 1 x daily - 5 x weekly - 2 sets - 5 reps - 3 sec hold ?- Seated Thoracic Flexion and Rotation with Swiss Ball  - 1 x daily - 5 x weekly - 2 sets - 5 reps - 3 sec hold ? ?Education details: HEP update ?Person educated: Patient ?Education method: Explanation, Demonstration, and Handouts ?Education comprehension: verbalized understanding  ? ? ?OBJECTIVE: (objective measures completed at initial evaluation unless otherwise dated) ?  ?LE ROM:    ?  ?Active  Right ?01/01/22 Left ?01/01/22  ?Hip flexion      ?Hip extension      ?Hip abduction      ?Hip adduction      ?Hip internal rotation      ?Hip external rotation      ?Knee flexion      ?Knee extension      ?Ankle dorsiflexion -3 13  ?Ankle plantarflexion      ?Ankle inversion      ?Ankle eversion      ? (Blank rows = not tested) ?  ?MMT:   ?  ?MMT Right ?01/01/22 Left ?01/01/22  ?Hip flexion 3+ 4+  ?Hip extension      ?Hip abduction 3+ 4+  ?Hip adduction 3+ 4+  ?Hip internal rotation      ?Hip external rotation      ?Knee flexion 3+ 5  ?Knee extension 4 5  ?Ankle dorsiflexion 3 4  ?Ankle plantarflexion 2 4  ?Ankle inversion      ?Ankle eversion      ?(Blank rows = not tested) ?  ?  ?  ?GAIT: ?Gait pattern: step to pattern, decreased step length- Left, decreased stance time- Right, decreased hip/knee flexion- Right, decreased ankle dorsiflexion- Right, Right hip hike, Right foot flat, trunk flexed, and poor foot clearance- Right ?Off balance and requiring wife to assist in stabilizing  ?  ?Assistive device utilized: None ?  ?  ?FUNCTIONAL TESTs:  ?5 times sit to stand: 25.12 sec pushing off B from chair without armrests; 1 episode of LOB upon standing from STS requiring PT and wife's support to recover  ?Timed up and go (TUG): 21.89 sec with heavy CGA ? ?Berg Balance Scale: 01/03/22 ? ?Sitting to Standing (4): able to stand w/o using hands and stabilize I ?(3): able to stand I using hands ?(2): able to stand using hands after several tries ?(1):  needs Min aid to stand or stabilize ?(0): needs ModA to MaxA to stand ?SCORE: 4  ?Standing Unsupported (4): able to stand safely for 2 mins w/o holding on ?(3): able to stand 2 mins w/supervision ?(2): able to stand 30 secs. Unsupported ?(1): needs several tries to stand 30 secs unsupported ?(0): unable to stand 30 secs unsupported ?SCORE: 4  ?Sitting Unsupported (4): able to sit safely and  securely for 2 mins ?(3): able to sit 2 mins w/supervision ?(2): able to sit 30 secs ?(1): able to sit 10 secs ?(0): unable to sit unsupported ?SCORE: 4  ?Standing to Sitting (4): sits safely w/min use of hands ?(3): controls descent w/hands ?(2): uses backs of legs against chair to control descent ?(1): sits I but has uncontrolled descent ?(0): needs assistance to sit ?SCORE: 3  ?Transfers (4): able to transfer safely w/minor use of hands ?(3): able to transfer safely definite need of hands ?(2): able to transfer w/verbal cues and/or supervision ?(1): needs 1 person to assist ?(0): needs 2 people to assist or supervise to be safe ?SCORE: 4  ?Standing Unsupported w/ Eyes Closed (4): able to stand 10 secs safely ?(3): able to stand 10 secs w/supervision ?(2): able to stand 3 secs ?(1): unable to keep eyes closed 3 secs but stays safely ?(0): needs help to keep from falling ?SCORE: 4  ?Standing Unsupported w/Feet Together (4): able to place feet together I and stand 1 min safely ?(3): able to place feet together I and stand 1 min w/supervision ?(2): able to place feet together I but unable to hold 30 secs ?(1): needs help to attain position but able to stand 15 secs feet together ?(0): needs help to attain position and unable to hold for 15 secs ?SCORE: 4  ?Reaching Fwd while Standing (4): reach fwd 25cm/10in ?(3): reach fwd 12cm/5in ?(2): reach fwd 5cm/2in ?(1): reach fwd but needs supervision ?(0): loses balance while trying/requires external support ?SCORE: 3  ?Pick Up Object from Floor while Standing (4): able to pick up safely  and easily ?(3): able to pick up but needs supervision ?(2): unable to pick up but reaches 1-2 in from object and keeps balance I ?(1): unable to pick up and needs supervision while trying ?(0): needs assist to kee

## 2022-01-08 ENCOUNTER — Ambulatory Visit (INDEPENDENT_AMBULATORY_CARE_PROVIDER_SITE_OTHER): Payer: HMO

## 2022-01-08 ENCOUNTER — Ambulatory Visit (INDEPENDENT_AMBULATORY_CARE_PROVIDER_SITE_OTHER): Payer: HMO | Admitting: Internal Medicine

## 2022-01-08 ENCOUNTER — Ambulatory Visit: Payer: HMO | Admitting: Physical Therapy

## 2022-01-08 ENCOUNTER — Encounter: Payer: Self-pay | Admitting: Internal Medicine

## 2022-01-08 VITALS — BP 162/84 | HR 83 | Temp 98.5°F | Wt 185.0 lb

## 2022-01-08 DIAGNOSIS — R079 Chest pain, unspecified: Secondary | ICD-10-CM

## 2022-01-08 DIAGNOSIS — I10 Essential (primary) hypertension: Secondary | ICD-10-CM

## 2022-01-08 DIAGNOSIS — N185 Chronic kidney disease, stage 5: Secondary | ICD-10-CM | POA: Diagnosis not present

## 2022-01-08 DIAGNOSIS — G8191 Hemiplegia, unspecified affecting right dominant side: Secondary | ICD-10-CM | POA: Diagnosis not present

## 2022-01-08 DIAGNOSIS — R2681 Unsteadiness on feet: Secondary | ICD-10-CM

## 2022-01-08 DIAGNOSIS — Z9181 History of falling: Secondary | ICD-10-CM

## 2022-01-08 DIAGNOSIS — R0781 Pleurodynia: Secondary | ICD-10-CM | POA: Diagnosis not present

## 2022-01-08 NOTE — Assessment & Plan Note (Addendum)
Reports insidious progressive weakness right lower extremity more than right upper.  Recently.  No obvious event.  Has just begun physical therapy ?

## 2022-01-08 NOTE — Progress Notes (Signed)
? ?Chief Complaint  ?Patient presents with  ? Flank Pain  ?  Started weeks ago, ? Pt 2x week  ? ? ?HPI: ?Russell Austin 71 y.o. come in with spouse for sda   PCP NA      ?Has a history of right hemiparesis from stroke number of years ago and most recently over the last couple weeks has had right sided chest wall like pain without a specific known injury.  He states that his right leg is getting weaker where it he cannot get up the stairs by himself anymore was seen by Georgina Snell last week and neuro PT was advised just started using the 4 pronged cane. ?However over the weekend he had ?Fall this Saturday  , lost his balance and fell into the bushes in the ground was hard to get up and needed n neighbor to get up because he fell on the right side no loss of consciousness chest pain shortness of breath the cause this  ?Denies any new specific illness as to cause low blood sugar etc. ?Blood pressure had been in a reasonable range ? ?He describes the pain as right  side lateral pain when certain moves  and sharp.  Like knife in back  .  And goes through him but not associated with other chest pain shortness of breath or diaphoresis.  Sometimes worse when he lifts his right shoulder if possible ?Feels like a knot    ? ?No excess bruising ?ROS: See pertinent positives and negatives per HPI. ? ?Past Medical History:  ?Diagnosis Date  ? 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 Diabetes mellitus type II   ? Diabetic retinopathy FOLLOWED BY DR Zadie Rhine  ? ED (erectile dysfunction)   ? Glaucoma of both eyes   ? Hypertension   ? Left hydrocele   ? Stroke [facility])   ? ? ?Family History  ?Problem Relation Age of Onset  ? Glaucoma Mother   ? Diabetes Mother   ? Stomach cancer Maternal Grandfather   ? Stroke Maternal Grandmother   ? ? ?Social History  ? ?Socioeconomic History  ? Marital status: Married  ?  Spouse name: Not on file  ? Number of children: 1  ? Years of education: 22  ? Highest education level: Not on file   ?Occupational History  ? Occupation: Disabled  ?Tobacco Use  ? Smoking status: Never  ? Smokeless tobacco: Never  ?Substance and Sexual Activity  ? Alcohol use: No  ? Drug use: No  ? Sexual activity: Not on file  ?Other Topics Concern  ? Not on file  ?Social History Narrative  ? Lives at home with his wife and granddaughter.  ? Left-handed.  ? 3 cups caffeine per day.  ? ?Social Determinants of Health  ? ?Financial Resource Strain: Low Risk   ? Difficulty of Paying Living Expenses: Not hard at all  ?Food Insecurity: No Food Insecurity  ? Worried About Charity fundraiser in the Last Year: Never true  ? Ran Out of Food in the Last Year: Never true  ?Transportation Needs: No Transportation Needs  ? Lack of Transportation (Medical): No  ? Lack of Transportation (Non-Medical): No  ?Physical Activity: Inactive  ? Days of Exercise per Week: 0 days  ? Minutes of Exercise per Session: 0 min  ?Stress: No Stress Concern Present  ? Feeling of Stress : Not at all  ?Social Connections: Socially Integrated  ? Frequency of Communication with Friends and  Family: More than three times a week  ? Frequency of Social Gatherings with Friends and Family: Twice a week  ? Attends Religious Services: More than 4 times per year  ? Active Member of Clubs or Organizations: Yes  ? Attends Archivist Meetings: 1 to 4 times per year  ? Marital Status: Married  ? ? ?Outpatient Medications Prior to Visit  ?Medication Sig Dispense Refill  ? amLODipine (NORVASC) 10 MG tablet Take 1 tablet (10 mg total) by mouth daily. 90 tablet 2  ? aspirin EC 81 MG tablet Take 81 mg by mouth daily.    ? atorvastatin (LIPITOR) 10 MG tablet Take 1 tablet (10 mg total) by mouth daily. 90 tablet 3  ? budesonide-formoterol (SYMBICORT) 160-4.5 MCG/ACT inhaler Inhale 2 puffs into the lungs in the morning and at bedtime. 3 each 3  ? buPROPion (WELLBUTRIN SR) 150 MG 12 hr tablet TAKE ONE TABLET BY MOUTH EVERY MORNING 90 tablet 0  ? chlorthalidone (HYGROTON) 25 MG  tablet Take 12.5 mg by mouth daily.    ? Continuous Blood Gluc Receiver (FREESTYLE LIBRE 14 DAY READER) DEVI Use with freestyle libre system 1 each 0  ? Continuous Blood Gluc Sensor (FREESTYLE LIBRE 14 DAY SENSOR) MISC Use with libre system 6 each 3  ? gabapentin (NEURONTIN) 300 MG capsule TAKE ONE CAPSULE BY MOUTH EVERYDAY AT BEDTIME 90 capsule 1  ? glipiZIDE (GLUCOTROL XL) 2.5 MG 24 hr tablet TAKE ONE TABLET BY MOUTH EVERY MORNING and TAKE ONE TABLET BY MOUTH EVERY EVENING 180 tablet 0  ? insulin glargine (LANTUS SOLOSTAR) 100 UNIT/ML Solostar Pen Inject 10 Units into the skin daily. 9 mL 0  ? Insulin Pen Needle (B-D ULTRAFINE III SHORT PEN) 31G X 8 MM MISC USE WITH LANUTS PEN 100 each 3  ? losartan (COZAAR) 100 MG tablet Take 1 tablet (100 mg total) by mouth every morning. 90 tablet 1  ? metoprolol tartrate (LOPRESSOR) 50 MG tablet Take 1 tablet (50 mg total) by mouth 2 (two) times daily. 180 tablet 3  ? tiZANidine (ZANAFLEX) 2 MG tablet TAKE ONE TABLET BY MOUTH EVERYDAY AT BEDTIME 90 tablet 1  ? ?No facility-administered medications prior to visit.  ? ? ? ?EXAM: ? ?BP (!) 162/84 (BP Location: Left Arm)   Pulse 83   Temp 98.5 ?F (36.9 ?C) (Oral)   Wt 185 lb (83.9 kg)   SpO2 100%   BMI 27.32 kg/m?  ? ?Body mass index is 27.32 kg/m?. ? ?GENERAL: vitals reviewed and listed above, alert, oriented, appears well hydrated and in no acute distress he is in wheelchair normal verbalization ?HEENT: atraumatic, no bruising noted no obvious abnormalities on inspection of external nose and ears  ?NECK: no obvious masses on inspection palpation  ?LUNGS: clear to auscultation bilaterally, no wheezes, rales or rhonchi, good air movement he is quite tender in the infrascapular and right lateral rib cage with no step-off or bruising T9-10 area and anterior. ?CV: HRRR, no clubbing cyanosis or obvious peripheral edema nl cap refill  ?MS: m moves all extremities but difficult to move right leg elevates with the left hand right  hemiparesis. ?PSYCH: pleasant and cooperative, no obvious depression or anxiety ?Lab Results  ?Component Value Date  ? WBC 4.0 12/19/2021  ? HGB 11.9 (L) 12/19/2021  ? HCT 35.4 (L) 12/19/2021  ? PLT 248.0 12/19/2021  ? GLUCOSE 170 (H) 12/19/2021  ? CHOL 118 12/19/2021  ? TRIG 123.0 12/19/2021  ? HDL 31.60 (L) 12/19/2021  ?  [place] 62 12/19/2021  ? ALT 19 12/19/2021  ? AST 20 12/19/2021  ? NA 140 12/19/2021  ? K 4.9 12/19/2021  ? CL 108 12/19/2021  ? CREATININE 3.52 (H) 12/19/2021  ? BUN 44 (H) 12/19/2021  ? CO2 25 12/19/2021  ? TSH 4.86 12/19/2021  ? PSA 0.73 12/19/2021  ? INR 1.06 02/10/2015  ? HGBA1C 7.7 (H) 12/19/2021  ? MICROALBUR 50.8 (H) 12/13/2014  ? ?BP Readings from Last 3 Encounters:  ?01/08/22 (!) 162/84  ?12/19/21 118/80  ?10/25/21 (!) 150/78  ? ? ?ASSESSMENT AND PLAN: ? ?Discussed the following assessment and plan: ? ?Right-sided chest pain - Plan: DG Chest 2 View, DG Ribs Unilateral Right ? ?Right hemiparesis ([place]) - worsening  RLE  - Plan: DG Chest 2 View, DG Ribs Unilateral Right ? ?CKD (chronic kidney disease) stage 5, GFR less than 15 ml/min (HCC) ? ?Gait instability ? ?Essential hypertension ? ?History of falling ?Pain appears to be chest wall rule out fracture predated this recent fall ?More concerning is his recent increasing weakness right lower extremity based on his history. ?X-ray today basically rule out surprises check for fracture ?I will sure blood pressure is reasonable range at home. ?We will leave it up to PCP if more evaluation needed for increasing weakness change in mobility status over the last weeks and months. ?Record review discussion evaluation and plan 30 minutes. ?-Patient advised to return or notify health care team  if  new concerns arise. ? ?Patient Instructions  ?X ray today  ? ?Seems like chest wall area  ? If previous injury?  ? ?Lungs and heart sound ok today . ?Make sure BP is coming down at home .  ?Will discuss with [name] about the progressive   increase in  weakness.  ? ?Make sure blood sugar etc is in control .  ? ? ?Standley Brooking. Inaara Tye M.D. ?

## 2022-01-08 NOTE — Patient Instructions (Signed)
X ray today  ? ?Seems like chest wall area  ? If previous injury?  ? ?Lungs and heart sound ok today . ?Make sure BP is coming down at home .  ?Will discuss with Russell Austin about the progressive   increase in weakness.  ? ?Make sure blood sugar etc is in control .  ?

## 2022-01-09 ENCOUNTER — Telehealth: Payer: Self-pay | Admitting: Adult Health

## 2022-01-09 NOTE — Progress Notes (Signed)
Chest x-ray is normal no fractures are seen forwarding information to Russell Austin PCP

## 2022-01-09 NOTE — Telephone Encounter (Signed)
Xray results have not been released patient will be contacted when results are available  ?

## 2022-01-09 NOTE — Telephone Encounter (Signed)
Pt seen dr Regis Bill yesterday and would like xray result ?

## 2022-01-10 ENCOUNTER — Ambulatory Visit: Payer: HMO | Admitting: Physical Therapy

## 2022-01-10 NOTE — Telephone Encounter (Signed)
Patient has been notified of results and will follow up with PCP appt has been scheduled  ?

## 2022-01-10 NOTE — Progress Notes (Signed)
So I just got the read on the rib  x-ray result back and there is an old fracture on the right 10th rib.  Indeterminate.  this was not seen on the chest x-ray.  This is the reason for your side pain and should get better with time, but pain can last up to 6 to 8 weeks on a rib fracture.

## 2022-01-11 ENCOUNTER — Ambulatory Visit (INDEPENDENT_AMBULATORY_CARE_PROVIDER_SITE_OTHER): Payer: HMO | Admitting: Adult Health

## 2022-01-11 ENCOUNTER — Encounter: Payer: Self-pay | Admitting: Adult Health

## 2022-01-11 VITALS — BP 140/80 | HR 66 | Temp 97.9°F

## 2022-01-11 DIAGNOSIS — S2231XA Fracture of one rib, right side, initial encounter for closed fracture: Secondary | ICD-10-CM

## 2022-01-11 MED ORDER — TRAMADOL HCL 50 MG PO TABS: 50.0000 mg | ORAL_TABLET | Freq: Two times a day (BID) | ORAL | 0 refills | Status: AC

## 2022-01-11 NOTE — Progress Notes (Signed)
? ?Subjective:  ? ? Patient ID: Russell Austin, male    DOB: Jul 05, 1951, 71 y.o.   MRN: [id number] ? 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 ? ?He presents to the office today for follow-up regarding acute right flank pain.  He was seen by another provider in the office 2 days ago for couple weeks of right-sided chest wall pain without a specific known injury.  He does report that 5 days ago he had a fall after he lost his balance and fell into a bush, it is hard for him to get up and needed the neighbor to help him because he fell on his right side.  He has a history of right hemiparesis from a stroke multiple years ago.  Currently going through neuro rehab due to gait instability as he feels as though his right leg is getting weaker and he cannot get up his stairs by himself any longer. ? ?The pain on this right flank is present with certain movements and is described as sharp.  He has not associated this pain with any shortness of breath or other chest pain. ? ?Chest x-ray did not show any acute fracture but x-ray at the right ribs showed an age indeterminate fracture of the 10th rib. ? ?Today on follow up he has not been told about the fracture area.  He continues to have severe right flank pain.  Pain is worse with movements, walking, and deep breathing.  He has not been using anything over-the-counter. ? ?Due to the pain he has had to postpone his PT/OT, which is unfortunate because he was noticing some increase strength and they were starting to work on him using a cane going up and down the stairs.  He plans to continue with this once he is feeling better. ? ? ?Review of Systems ?See HPI  ? ?Past Medical History:  ?Diagnosis Date  ? Blindness, legal RIGHT EYE  SECONDARY TO ACUTE GLAUCOMA  ? Diabetes mellitus type II   ? Diabetic retinopathy FOLLOWED BY DR Zadie Rhine  ? ED (erectile dysfunction)   ? Glaucoma of both eyes   ? Hypertension   ? Left hydrocele   ? Stroke [facility])   ? ? ?Social History  ? ?Socioeconomic History  ? Marital status: Married  ?  Spouse name: Not on file  ? Number of children: 1  ? Years of education: 57  ? Highest education level: Not on file  ?Occupational History  ? Occupation: Disabled  ?Tobacco Use  ? Smoking status: Never  ? Smokeless tobacco: Never  ?Substance and Sexual Activity  ? Alcohol use: No  ? Drug use: No  ? Sexual activity: Not on file  ?Other Topics Concern  ? Not on file  ?Social History Narrative  ? Lives at home with his wife and granddaughter.  ? Left-handed.  ? 3 cups caffeine per day.  ? ?Social Determinants of Health  ? ?Financial Resource Strain: Low Risk   ? Difficulty of Paying Living Expenses: Not hard at all  ?Food Insecurity: No Food Insecurity  ? Worried About Charity fundraiser in the Last Year: Never true  ? Ran Out of Food in the Last Year: Never true  ?Transportation Needs: No Transportation Needs  ? Lack of Transportation (Medical):  No  ? Lack of Transportation (Non-Medical): No  ?Physical Activity: Inactive  ? Days of Exercise per Week: 0 days  ? Minutes of Exercise per Session: 0 min  ?Stress: No Stress Concern Present  ? Feeling of Stress : Not at all  ?Social Connections: Socially Integrated  ? Frequency of Communication with Friends and Family: More than three times a week  ? Frequency of Social Gatherings with Friends and Family: Twice a week  ? Attends Religious Services: More than 4 times per year  ? Active Member of Clubs or Organizations: Yes  ? Attends Archivist Meetings: 1 to 4 times per year  ? Marital Status: Married  ?Intimate Partner Violence: Not At Risk  ? Fear of Current or Ex-Partner: No  ? Emotionally Abused: No  ? Physically Abused: No  ? Sexually Abused: No  ? ? ?Past Surgical History:   ?Procedure Laterality Date  ? APPENDECTOMY  AGE EARLY 20'S  ? COLONOSCOPY  12/30/2020  ? every 5 years  ? HYDROCELE EXCISION  03/31/2012  ? Procedure: HYDROCELECTOMY ADULT;  Surgeon: Franchot Gallo, MD;  Location: [facility];  Service: Urology;  Laterality: Left;  45 MINS ? ?  ? LEFT EYE LASER RETINA REPAIR  SEPT 2012  ? RIGHT EYE VITRECTOMY/ INSERTION GLAUCOMA SETON/ LASER REPAIR  12-13-2008  ? RETINAL ARTERY OCCLUSION /NEOVASCULAR GLAUCOMA/ HEMORRHAGE  ? RIGHT EYE VITRETOMY/ INSERTION GLAUCOMA SETON X2/ LASER  03-24-2009  ? RECURRENT HEMORRHAGE/ OCCLUSION INTERNAL SETON  ? SHOULDER ARTHROSCOPY Right 2005  ? undescended right testicle removed  1994  ? ? ?Family History  ?Problem Relation Age of Onset  ? Glaucoma Mother   ? Diabetes Mother   ? Stomach cancer Maternal Grandfather   ? Stroke Maternal Grandmother   ? ? ?Allergies  ?Allergen Reactions  ? Lactose Intolerance (Gi) Diarrhea  ? Apple Pectin [Pectin] Itching  ?  ITCHY THROAT  ? Metformin And Related   ?  D/t decreased kidney function   ? Peach [Prunus Persica] Itching  ?  ITCHY THROAT  ? Morphine And Related Anxiety  ?  Jittery  ? ? ?Current Outpatient Medications on File Prior to Visit  ?Medication Sig Dispense Refill  ? amLODipine (NORVASC) 10 MG tablet Take 1 tablet (10 mg total) by mouth daily. 90 tablet 2  ? aspirin EC 81 MG tablet Take 81 mg by mouth daily.    ? atorvastatin (LIPITOR) 10 MG tablet Take 1 tablet (10 mg total) by mouth daily. 90 tablet 3  ? budesonide-formoterol (SYMBICORT) 160-4.5 MCG/ACT inhaler Inhale 2 puffs into the lungs in the morning and at bedtime. 3 each 3  ? buPROPion (WELLBUTRIN SR) 150 MG 12 hr tablet TAKE ONE TABLET BY MOUTH EVERY MORNING 90 tablet 0  ? chlorthalidone (HYGROTON) 25 MG tablet Take 12.5 mg by mouth daily.    ? Continuous Blood Gluc Receiver (FREESTYLE LIBRE 14 DAY READER) DEVI Use with freestyle libre system 1 each 0  ? Continuous Blood Gluc Sensor (FREESTYLE LIBRE 14 DAY SENSOR) MISC Use  with libre system 6 each 3  ? gabapentin (NEURONTIN) 300 MG capsule TAKE ONE CAPSULE BY MOUTH EVERYDAY AT BEDTIME 90 capsule 1  ? glipiZIDE (GLUCOTROL XL) 2.5 MG 24 hr tablet TAKE ONE TABLET BY MOUTH EVERY MORNING and TAKE ONE TABLET BY MOUTH EVERY EVENING 180 tablet 0  ? insulin glargine (LANTUS SOLOSTAR) 100 UNIT/ML Solostar Pen Inject 10 Units into the skin daily. 9 mL 0  ? Insulin Pen Needle (  B-D ULTRAFINE III SHORT PEN) 31G X 8 MM MISC USE WITH LANUTS PEN 100 each 3  ? losartan (COZAAR) 100 MG tablet Take 1 tablet (100 mg total) by mouth every morning. 90 tablet 1  ? metoprolol tartrate (LOPRESSOR) 50 MG tablet Take 1 tablet (50 mg total) by mouth 2 (two) times daily. 180 tablet 3  ? tiZANidine (ZANAFLEX) 2 MG tablet TAKE ONE TABLET BY MOUTH EVERYDAY AT BEDTIME 90 tablet 1  ? ?No current facility-administered medications on file prior to visit.  ? ? ?There were no vitals taken for this visit. ? ? ?   ?Objective:  ? Physical Exam ?Vitals and nursing note reviewed.  ?Constitutional:   ?   Appearance: Normal appearance.  ?Cardiovascular:  ?   Rate and Rhythm: Normal rate and regular rhythm.  ?   Pulses: Normal pulses.  ?   Heart sounds: Normal heart sounds.  ?Pulmonary:  ?   Effort: Pulmonary effort is normal.  ?   Breath sounds: Normal breath sounds.  ?Chest:  ?   Chest wall: Tenderness present. No crepitus.  ?   Comments: Reproducible pain to right 10th rib.  ?Musculoskeletal:     ?   General: Normal range of motion.  ?Skin: ?   General: Skin is warm and dry.  ?Neurological:  ?   General: No focal deficit present.  ?   Mental Status: He is alert and oriented to person, place, and time.  ?Psychiatric:     ?   Mood and Affect: Mood normal.     ?   Behavior: Behavior normal.     ?   Thought Content: Thought content normal.     ?   Judgment: Judgment normal.  ? ?   ?Assessment & Plan:  ?1. Closed fracture of one rib of right side, initial encounter ?-Provide a short course of tramadol that he can take as needed for  pain.  Did advise to try and use Tylenol and if pain continues to be severe he can take tramadol as needed.  Discussed deep breathing exercises to prevent pneumonia.  I would like him up walking around

## 2022-01-11 NOTE — Therapy (Incomplete)
?OUTPATIENT PHYSICAL THERAPY TREATMENT NOTE ? ? ?Patient Name: Russell Austin ?MRN: [id number] ?DOB:30-Oct-1950, 71 y.o., male ?Today's Date: 01/11/2022 ? ?PCP: [name], NP  ?REFERRING PROVIDER: [name], NP  ? ?END OF SESSION:  ? ? ? ?Past Medical History:  ?Diagnosis Date  ? 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 Diabetes mellitus type II   ? Diabetic retinopathy FOLLOWED BY DR Zadie Rhine  ? ED (erectile dysfunction)   ? Glaucoma of both eyes   ? Hypertension   ? Left hydrocele   ? Stroke [facility])   ? ?Past Surgical History:  ?Procedure Laterality Date  ? APPENDECTOMY  AGE EARLY 20'S  ? COLONOSCOPY  12/30/2020  ? every 5 years  ? HYDROCELE EXCISION  03/31/2012  ? Procedure: HYDROCELECTOMY ADULT;  Surgeon: Franchot Gallo, MD;  Location: [facility];  Service: Urology;  Laterality: Left;  45 MINS ? ?  ? LEFT EYE LASER RETINA REPAIR  SEPT 2012  ? RIGHT EYE VITRECTOMY/ INSERTION GLAUCOMA SETON/ LASER REPAIR  12-13-2008  ? RETINAL ARTERY OCCLUSION /NEOVASCULAR GLAUCOMA/ HEMORRHAGE  ? RIGHT EYE VITRETOMY/ INSERTION GLAUCOMA SETON X2/ LASER  03-24-2009  ? RECURRENT HEMORRHAGE/ OCCLUSION INTERNAL SETON  ? SHOULDER ARTHROSCOPY Right 2005  ? undescended right testicle removed  1994  ? ?Patient Active Problem List  ? Diagnosis Date Noted  ? Diabetes mellitus ([place]) 07/14/2020  ? Hyperlipidemia 07/14/2020  ? Osteoarthritis 07/14/2020  ? Plantar fasciitis 07/14/2020  ? Stasis edema of both lower extremities 01/01/2019  ? Primary osteoarthritis of left shoulder 03/20/2018  ? 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 Complex regional pain syndrome I of upper limb 06/27/2015  ? Dysphagia following cerebral infarction 02/15/2015  ? 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 CKD stage 2 due to type 2 diabetes mellitus ([place]) 02/12/2015  ? Right hemiparesis ([place])   ? CKD (chronic kidney disease) stage 5, GFR less than 15 ml/min  (HCC) 02/10/2015  ? Type 2 diabetes mellitus with diabetic neuropathy ([place]) 02/10/2015  ? Neck pain   ? Numbness and tingling 02/09/2015  ? Left shoulder pain 07/17/2012  ? Plantar fasciitis, bilateral 07/17/2012  ? Peripheral neuropathy 11/06/2007  ? 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  ? Essential hypertension 07/08/2007  ? EXTRINSIC ASTHMA, UNSPECIFIED 06/25/2007  ? ? ?REFERRING DIAG: Gait instability  ? ?THERAPY DIAG:  ?No diagnosis found. ? ?PERTINENT HISTORY: R eye blindness, DM II, diabetic retinopathy, HTN, stroke 2016, R shoulder scope 2005  ? ?PRECAUTIONS: Fall  ? ?SUBJECTIVE: Feeling a lot better than he was yesterday- had a stomachache. Denies questions on HEP.  ? ?PAIN:  ?Are you having pain? Yes: NPRS scale: 7/10 ?Pain location: R hand radiating to shoulder and in R LE ?Pain description: dull ?Aggravating factors: spontaneous ?Relieving factors: nothing ? ? ? ?TODAY'S TREATMENT: 01/15/22 ?Activity Comments  ?nustep   ?STS R foot back (elevated?)   ?sitting LAQ   ?standing march   ?gastroc stretch   ?gait training with quad tip cane; work on turns ?trial AFO? ?   ? ? ? ? ? ?TREATMENT 01/03/22: ? ?Activity Comments  ?Sit to Stand without Ues 5x  Cues for set up  ?Gait training with SPC  and SPC with quad tip 26ft, 49ft Good carryover of sequencing and much more smooth and steady gait pattern however still with R knee stuck in extension and with L foot in eversion and scissoring   ?Prayer stretch with green  pball 5x3" forward and L diagonal Report of good relief of back pain  ?  ? ?PATIENT EDUCATION: ?Access Code: [email or web address] ?URL: [email or web address] ?Date: 01/03/2022 ?Prepared by: [facility] ? ?Exercises ?- Sit to Stand Without Arm Support  - 1 x daily - 5 x weekly - 2 sets - 5 reps ?- Seated Long Arc Quad  - 1 x daily - 5 x weekly - 2 sets - 10 reps ?- Standing March with Counter Support  - 1 x daily - 5 x weekly - 2 sets - 10 reps ?- Gastroc Stretch on Wall  -  1 x daily - 5 x weekly - 2 sets - 30 sec hold ?- Seated Flexion Stretch with Swiss Ball  - 1 x daily - 5 x weekly - 2 sets - 5 reps - 3 sec hold ?- Seated Thoracic Flexion and Rotation with Swiss Ball  - 1 x daily - 5 x weekly - 2 sets - 5 reps - 3 sec hold ? ?Education details: HEP update ?Person educated: Patient ?Education method: Explanation, Demonstration, and Handouts ?Education comprehension: verbalized understanding  ? ? ?OBJECTIVE: (objective measures completed at initial evaluation unless otherwise dated) ?  ?LE ROM:    ?  ?Active  Right ?01/01/22 Left ?01/01/22  ?Hip flexion      ?Hip extension      ?Hip abduction      ?Hip adduction      ?Hip internal rotation      ?Hip external rotation      ?Knee flexion      ?Knee extension      ?Ankle dorsiflexion -3 13  ?Ankle plantarflexion      ?Ankle inversion      ?Ankle eversion      ? (Blank rows = not tested) ?  ?MMT:   ?  ?MMT Right ?01/01/22 Left ?01/01/22  ?Hip flexion 3+ 4+  ?Hip extension      ?Hip abduction 3+ 4+  ?Hip adduction 3+ 4+  ?Hip internal rotation      ?Hip external rotation      ?Knee flexion 3+ 5  ?Knee extension 4 5  ?Ankle dorsiflexion 3 4  ?Ankle plantarflexion 2 4  ?Ankle inversion      ?Ankle eversion      ?(Blank rows = not tested) ?  ?  ?  ?GAIT: ?Gait pattern: step to pattern, decreased step length- Left, decreased stance time- Right, decreased hip/knee flexion- Right, decreased ankle dorsiflexion- Right, Right hip hike, Right foot flat, trunk flexed, and poor foot clearance- Right ?Off balance and requiring wife to assist in stabilizing  ?  ?Assistive device utilized: None ?  ?  ?FUNCTIONAL TESTs:  ?5 times sit to stand: 25.12 sec pushing off B from chair without armrests; 1 episode of LOB upon standing from STS requiring PT and wife's support to recover  ?Timed up and go (TUG): 21.89 sec with heavy CGA ? ?Berg Balance Scale: 01/03/22 ? ?Sitting to Standing (4): able to stand w/o using hands and stabilize I ?(3): able to stand I  using hands ?(2): able to stand using hands after several tries ?(1): needs Min aid to stand or stabilize ?(0): needs ModA to MaxA to stand ?SCORE: 4  ?Standing Unsupported (4): able to stand safely for 2 mins w/o holding on ?(3): able to stand 2 mins w/supervision ?(2): able to stand 30 secs. Unsupported ?(1): needs several tries to stand 30 secs unsupported ?(0):  unable to stand 30 secs unsupported ?SCORE: 4  ?Sitting Unsupported (4): able to sit safely and securely for 2 mins ?(3): able to sit 2 mins w/supervision ?(2): able to sit 30 secs ?(1): able to sit 10 secs ?(0): unable to sit unsupported ?SCORE: 4  ?Standing to Sitting (4): sits safely w/min use of hands ?(3): controls descent w/hands ?(2): uses backs of legs against chair to control descent ?(1): sits I but has uncontrolled descent ?(0): needs assistance to sit ?SCORE: 3  ?Transfers (4): able to transfer safely w/minor use of hands ?(3): able to transfer safely definite need of hands ?(2): able to transfer w/verbal cues and/or supervision ?(1): needs 1 person to assist ?(0): needs 2 people to assist or supervise to be safe ?SCORE: 4  ?Standing Unsupported w/ Eyes Closed (4): able to stand 10 secs safely ?(3): able to stand 10 secs w/supervision ?(2): able to stand 3 secs ?(1): unable to keep eyes closed 3 secs but stays safely ?(0): needs help to keep from falling ?SCORE: 4  ?Standing Unsupported w/Feet Together (4): able to place feet together I and stand 1 min safely ?(3): able to place feet together I and stand 1 min w/supervision ?(2): able to place feet together I but unable to hold 30 secs ?(1): needs help to attain position but able to stand 15 secs feet together ?(0): needs help to attain position and unable to hold for 15 secs ?SCORE: 4  ?Reaching Fwd while Standing (4): reach fwd 25cm/10in ?(3): reach fwd 12cm/5in ?(2): reach fwd 5cm/2in ?(1): reach fwd but needs supervision ?(0): loses balance while trying/requires external support ?SCORE: 3   ?Pick Up Object from Floor while Standing (4): able to pick up safely and easily ?(3): able to pick up but needs supervision ?(2): unable to pick up but reaches 1-2 in from object and keeps balance I ?

## 2022-01-12 DIAGNOSIS — N184 Chronic kidney disease, stage 4 (severe): Secondary | ICD-10-CM | POA: Diagnosis not present

## 2022-01-15 ENCOUNTER — Ambulatory Visit: Payer: HMO | Admitting: Physical Therapy

## 2022-01-17 ENCOUNTER — Ambulatory Visit: Payer: HMO | Admitting: Occupational Therapy

## 2022-01-17 ENCOUNTER — Ambulatory Visit: Payer: HMO | Admitting: Physical Therapy

## 2022-01-17 ENCOUNTER — Ambulatory Visit (INDEPENDENT_AMBULATORY_CARE_PROVIDER_SITE_OTHER): Payer: HMO

## 2022-01-17 ENCOUNTER — Ambulatory Visit: Payer: HMO

## 2022-01-17 VITALS — Ht 69.0 in | Wt 185.0 lb

## 2022-01-17 DIAGNOSIS — Z0000 Encounter for general adult medical examination without abnormal findings: Secondary | ICD-10-CM | POA: Diagnosis not present

## 2022-01-17 NOTE — Progress Notes (Signed)
? ?Subjective:  ? Russell Austin is a 71 y.o. male who presents for Medicare Annual/Subsequent preventive examination. ? ?Review of Systems    ?Virtual Visit via Telephone Note ? ?I connected with  Russell Austin on 01/17/22 at 12:30 PM EDT by telephone and verified that I am speaking with the correct person using two identifiers. ? ?Location: ?Patient: Home ?Provider: Office ?Persons participating in the virtual visit: patient/Nurse Health Advisor ?  ?I discussed the limitations, risks, security and privacy concerns of performing an evaluation and management service by telephone and the availability of in person appointments. The patient expressed understanding and agreed to proceed. ? ?Interactive audio and video telecommunications were attempted between this nurse and patient, however failed, due to patient having technical difficulties OR patient did not have access to video capability.  We continued and completed visit with audio only. ? ?Some vital signs may be absent or patient reported.  ? ?Russell Peaches, LPN  ?Cardiac Risk Factors include: advanced age (>65men, >54 women);diabetes mellitus;male gender;hypertension ? ?   ?Objective:  ?  ?Today's Vitals  ? 01/17/22 1233  ?Weight: 185 lb (83.9 kg)  ?Height: 5\' 9"  (1.753 m)  ? ?Body mass index is 27.32 kg/m?. ? ? ?  01/17/2022  ? 12:53 PM 01/01/2022  ?  8:09 AM 07/17/2021  ?  9:14 AM 01/11/2021  ?  9:00 AM 12/30/2019  ? 12:52 PM 10/28/2018  ?  8:08 AM 04/24/2016  ?  3:01 PM  ?Advanced Directives  ?Does Patient Have a Medical Advance Directive? Yes Yes Yes Yes No No No  ?Type of Paramedic of [place];Living will  [place];Living will Healthcare Power of Attorney     ?Does patient want to make changes to medical advance directive? No - Patient declined No - Patient declined No - Patient declined      ?Copy of [place] in Chart? No - copy requested  No - copy requested No - copy requested      ?Would patient like information on creating a medical advance directive?     Yes (MAU/Ambulatory/Procedural Areas - Information given) Yes (ED - Information included in AVS)   ? ? ?Current Medications (verified) ?Outpatient Encounter Medications as of 01/17/2022  ?Medication Sig  ? amLODipine (NORVASC) 10 MG tablet Take 1 tablet (10 mg total) by mouth daily.  ? aspirin EC 81 MG tablet Take 81 mg by mouth daily.  ? atorvastatin (LIPITOR) 10 MG tablet Take 1 tablet (10 mg total) by mouth daily.  ? budesonide-formoterol (SYMBICORT) 160-4.5 MCG/ACT inhaler Inhale 2 puffs into the lungs in the morning and at bedtime.  ? buPROPion (WELLBUTRIN SR) 150 MG 12 hr tablet TAKE ONE TABLET BY MOUTH EVERY MORNING  ? chlorthalidone (HYGROTON) 25 MG tablet Take 12.5 mg by mouth daily.  ? Continuous Blood Gluc Receiver (FREESTYLE LIBRE 14 DAY READER) DEVI Use with freestyle libre system  ? Continuous Blood Gluc Sensor (FREESTYLE LIBRE 14 DAY SENSOR) MISC Use with libre system  ? gabapentin (NEURONTIN) 300 MG capsule TAKE ONE CAPSULE BY MOUTH EVERYDAY AT BEDTIME  ? glipiZIDE (GLUCOTROL XL) 2.5 MG 24 hr tablet TAKE ONE TABLET BY MOUTH EVERY MORNING and TAKE ONE TABLET BY MOUTH EVERY EVENING  ? insulin glargine (LANTUS SOLOSTAR) 100 UNIT/ML Solostar Pen Inject 10 Units into the skin daily.  ? Insulin Pen Needle (B-D ULTRAFINE III SHORT PEN) 31G X 8 MM MISC USE WITH LANUTS PEN  ? losartan (COZAAR)  100 MG tablet Take 1 tablet (100 mg total) by mouth every morning.  ? metoprolol tartrate (LOPRESSOR) 50 MG tablet Take 1 tablet (50 mg total) by mouth 2 (two) times daily.  ? tiZANidine (ZANAFLEX) 2 MG tablet TAKE ONE TABLET BY MOUTH EVERYDAY AT BEDTIME  ? traMADol (ULTRAM) 50 MG tablet Take 1 tablet (50 mg total) by mouth 2 (two) times daily for 7 days.  ? ?No facility-administered encounter medications on file as of 01/17/2022.  ? ? ?Allergies (verified) ?Lactose intolerance (gi), Apple pectin [pectin], Metformin and related, Peach [prunus  persica], and Morphine and related  ? ?History: ?Past Medical History:  ?Diagnosis Date  ? 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 Diabetes mellitus type II   ? Diabetic retinopathy FOLLOWED BY DR Zadie Rhine  ? ED (erectile dysfunction)   ? Glaucoma of both eyes   ? Hypertension   ? Left hydrocele   ? Stroke [facility])   ? ?Past Surgical History:  ?Procedure Laterality Date  ? APPENDECTOMY  AGE EARLY 20'S  ? COLONOSCOPY  12/30/2020  ? every 5 years  ? HYDROCELE EXCISION  03/31/2012  ? Procedure: HYDROCELECTOMY ADULT;  Surgeon: Russell Gallo, MD;  Location: [facility];  Service: Urology;  Laterality: Left;  45 MINS ? ?  ? LEFT EYE LASER RETINA REPAIR  SEPT 2012  ? RIGHT EYE VITRECTOMY/ INSERTION GLAUCOMA SETON/ LASER REPAIR  12-13-2008  ? RETINAL ARTERY OCCLUSION /NEOVASCULAR GLAUCOMA/ HEMORRHAGE  ? RIGHT EYE VITRETOMY/ INSERTION GLAUCOMA SETON X2/ LASER  03-24-2009  ? RECURRENT HEMORRHAGE/ OCCLUSION INTERNAL SETON  ? SHOULDER ARTHROSCOPY Right 2005  ? undescended right testicle removed  1994  ? ?Family History  ?Problem Relation Age of Onset  ? Glaucoma Mother   ? Diabetes Mother   ? Stomach cancer Maternal Grandfather   ? Stroke Maternal Grandmother   ? ?Social History  ? ?Socioeconomic History  ? Marital status: Married  ?  Spouse name: Not on file  ? Number of children: 1  ? Years of education: 46  ? Highest education level: Not on file  ?Occupational History  ? Occupation: Disabled  ?Tobacco Use  ? Smoking status: Never  ? Smokeless tobacco: Never  ?Substance and Sexual Activity  ? Alcohol use: No  ? Drug use: No  ? Sexual activity: Not on file  ?Other Topics Concern  ? Not on file  ?Social History Narrative  ? Lives at home with his wife and granddaughter.  ? Left-handed.  ? 3 cups caffeine per day.  ? ?Social Determinants of Health  ? ?Financial Resource Strain: Low Risk   ? Difficulty of Paying Living Expenses: Not hard at all  ?Food Insecurity: No Food Insecurity  ? Worried About  Charity fundraiser in the Last Year: Never true  ? Ran Out of Food in the Last Year: Never true  ?Transportation Needs: No Transportation Needs  ? Lack of Transportation (Medical): No  ? Lack of Transportation (Non-Medical): No  ?Physical Activity: Inactive  ? Days of Exercise per Week: 0 days  ? Minutes of Exercise per Session: 0 min  ?Stress: No Stress Concern Present  ? Feeling of Stress : Not at all  ?Social Connections: Socially Integrated  ? Frequency of Communication with Friends and Family: More than three times a week  ? Frequency of Social Gatherings with Friends and Family: More than three times a week  ? Attends Religious Services: More than 4 times per year  ? Active  Member of Clubs or Organizations: Yes  ? Attends Archivist Meetings: More than 4 times per year  ? Marital Status: Married  ? ? ?Tobacco Counseling ?Counseling given: Not Answered ? ? ?Clinical Intake: ?Nutrition Risk Assessment: ? ?Has the patient had any N/V/D within the last 2 months?  No  ?Does the patient have any non-healing wounds?  No  ?Has the patient had any unintentional weight loss or weight gain?  No  ? ?Diabetes: ? ?Is the patient diabetic?  Yes  ?If diabetic, was a CBG obtained today?  No  ?Did the patient bring in their glucometer from home?  No  ?How often do you monitor your CBG's? Daily.  ? ?Financial Strains and Diabetes Management: ? ?Are you having any financial strains with the device, your supplies or your medication? No .  ?Does the patient want to be seen by Chronic Care Management for management of their diabetes?  No  ?Would the patient like to be referred to a Nutritionist or for Diabetic Management?  No  ? ?Diabetic Exams: ? ?Diabetic Eye Exam: Completed Yes. Overdue for diabetic eye exam. Pt has been advised about the importance in completing this exam. A referral has been placed today. Message sent to referral coordinator for scheduling purposes. Advised pt to expect a call from office referred to  regarding appt. ? ?Diabetic Foot Exam: Completed Yes. Pt has been advised about the importance in completing this exam. Pt is scheduled for diabetic foot exam on Followed by PCP.   ?Pre-visit preparatio

## 2022-01-17 NOTE — Patient Instructions (Addendum)
?Mr. Austin , ?Thank you for taking time to come for your Medicare Wellness Visit. I appreciate your ongoing commitment to your health goals. Please review the following plan we discussed and let me know if I can assist you in the future.  ? ?These are the goals we discussed: ? Goals   ? ?   Patient Stated   ?   Lose weight  ?  ?   Patient stated (pt-stated)   ?   I want to get healthy ?  ? ?  ?  ?This is a list of the screening recommended for you and due dates:  ?Health Maintenance  ?Topic Date Due  ? Flu Shot  04/03/2022  ? Eye exam for diabetics  04/17/2022  ? Hemoglobin A1C  06/20/2022  ? Complete foot exam   12/20/2022  ? Colon Cancer Screening  12/31/2030  ? Pneumonia Vaccine  Completed  ? Hepatitis C Screening: USPSTF Recommendation to screen - Ages 20-79 yo.  Completed  ? Zoster (Shingles) Vaccine  Completed  ? HPV Vaccine  Aged Out  ? COVID-19 Vaccine  Discontinued  ? ?Opioid Pain Medicine Management ?Opioids are powerful medicines that are used to treat moderate to severe pain. When used for short periods of time, they can help you to: ?Sleep better. ?Do better in physical or occupational therapy. ?Feel better in the first few days after an injury. ?Recover from surgery. ?Opioids should be taken with the supervision of a trained health care provider. They should be taken for the shortest period of time possible. This is because opioids can be addictive, and the longer you take opioids, the greater your risk of addiction. This addiction can also be called opioid use disorder. ?What are the risks? ?Using opioid pain medicines for longer than 3 days increases your risk of side effects. Side effects include: ?Constipation. ?Nausea and vomiting. ?Breathing difficulties (respiratory depression). ?Drowsiness. ?Confusion. ?Opioid use disorder. ?Itching. ?Taking opioid pain medicine for a long period of time can affect your ability to do daily tasks. It also puts you at risk for: ?Motor vehicle  crashes. ?Depression. ?Suicide. ?Heart attack. ?Overdose, which can be life-threatening. ?What is a pain treatment plan? ?A pain treatment plan is an agreement between you and your health care provider. Pain is unique to each person, and treatments vary depending on your condition. To manage your pain, you and your health care provider need to work together. To help you do this: ?Discuss the goals of your treatment, including how much pain you might expect to have and how you will manage the pain. ?Review the risks and benefits of taking opioid medicines. ?Remember that a good treatment plan uses more than one approach and minimizes the chance of side effects. ?Be honest about the amount of medicines you take and about any drug or alcohol use. ?Get pain medicine prescriptions from only one health care provider. ?Pain can be managed with many types of alternative treatments. Ask your health care provider to refer you to one or more specialists who can help you manage pain through: ?Physical or occupational therapy. ?Counseling (cognitive behavioral therapy). ?Good nutrition. ?Biofeedback. ?Massage. ?Meditation. ?Non-opioid medicine. ?Following a gentle exercise program. ?How to use opioid pain medicine ?Taking medicine ?Take your pain medicine exactly as told by your health care provider. Take it only when you need it. ?If your pain gets less severe, you may take less than your prescribed dose if your health care provider approves. ?If you are not having pain, do  nottake pain medicine unless your health care provider tells you to take it. ?If your pain is severe, do nottry to treat it yourself by taking more pills than instructed on your prescription. Contact your health care provider for help. ?Write down the times when you take your pain medicine. It is easy to become confused while on pain medicine. Writing the time can help you avoid overdose. ?Take other over-the-counter or prescription medicines only as told by  your health care provider. ?Keeping yourself and others safe ? ?While you are taking opioid pain medicine: ?Do not drive, use machinery, or power tools. ?Do not sign legal documents. ?Do not drink alcohol. ?Do not take sleeping pills. ?Do not supervise children by yourself. ?Do not do activities that require climbing or being in high places. ?Do not go to a lake, river, ocean, spa, or swimming pool. ?Do not share your pain medicine with anyone. ?Keep pain medicine in a locked cabinet or in a secure area where pets and children cannot reach it. ?Stopping your use of opioids ?If you have been taking opioid medicine for more than a few weeks, you may need to slowly decrease (taper) how much you take until you stop completely. Tapering your use of opioids can decrease your risk of symptoms of withdrawal, such as: ?Pain and cramping in the abdomen. ?Nausea. ?Sweating. ?Sleepiness. ?Restlessness. ?Uncontrollable shaking (tremors). ?Cravings for the medicine. ?Do not attempt to taper your use of opioids on your own. Talk with your health care provider about how to do this. Your health care provider may prescribe a step-down schedule based on how much medicine you are taking and how long you have been taking it. ?Getting rid of leftover pills ?Do not save any leftover pills. Get rid of leftover pills safely by: ?Taking the medicine to a prescription take-back program. This is usually offered by the county or law enforcement. ?Bringing them to a pharmacy that has a drug disposal container. ?Flushing them down the toilet. Check the label or package insert of your medicine to see whether this is safe to do. ?Throwing them out in the trash. Check the label or package insert of your medicine to see whether this is safe to do. ?If it is safe to throw it out, remove the medicine from the original container, put it into a sealable bag or container, and mix it with used coffee grounds, food scraps, dirt, or cat litter before putting  it in the trash. ?Follow these instructions at home: ?Activity ?Do exercises as told by your health care provider. ?Avoid activities that make your pain worse. ?Return to your normal activities as told by your health care provider. Ask your health care provider what activities are safe for you. ?General instructions ?You may need to take these actions to prevent or treat constipation: ?Drink enough fluid to keep your urine pale yellow. ?Take over-the-counter or prescription medicines. ?Eat foods that are high in fiber, such as beans, whole grains, and fresh fruits and vegetables. ?Limit foods that are high in fat and processed sugars, such as fried or sweet foods. ?Keep all follow-up visits. This is important. ?Where to find support ?If you have been taking opioids for a long time, you may benefit from receiving support for quitting from a local support group or counselor. Ask your health care provider for a referral to these resources in your area. ?Where to find more information ?Centers for Disease Control and Prevention (CDC): http://www.wolf.info/ ?[place] Food and Drug Administration (FDA): [place].ch ?Get  help right away if: ?You may have taken too much of an opioid (overdosed). Common symptoms of an overdose: ?Your breathing is slower or more shallow than normal. ?You have a very slow heartbeat (pulse). ?You have slurred speech. ?You have nausea and vomiting. ?Your pupils become very small. ?You have other potential symptoms: ?You are very confused. ?You faint or feel like you will faint. ?You have cold, clammy skin. ?You have blue lips or fingernails. ?You have thoughts of harming yourself or harming others. ?These symptoms may represent a serious problem that is an emergency. Do not wait to see if the symptoms will go away. Get medical help right away. Call your local emergency services (911 in the [place] Do not drive yourself to the hospital.  ?If you ever feel like you may hurt yourself or others, or have thoughts  about taking your own life, get help right away. Go to your nearest emergency department or: ?Call your local emergency services (911 in the [place] ?Call the [facility] [phone] in the

## 2022-01-22 ENCOUNTER — Ambulatory Visit: Payer: HMO | Admitting: Physical Therapy

## 2022-01-24 ENCOUNTER — Encounter: Payer: HMO | Admitting: Occupational Therapy

## 2022-01-24 ENCOUNTER — Ambulatory Visit: Payer: HMO | Admitting: Physical Therapy

## 2022-01-30 ENCOUNTER — Encounter: Payer: HMO | Admitting: Occupational Therapy

## 2022-01-30 ENCOUNTER — Ambulatory Visit: Payer: HMO | Admitting: Physical Therapy

## 2022-02-01 ENCOUNTER — Ambulatory Visit: Payer: HMO

## 2022-02-01 ENCOUNTER — Encounter: Payer: HMO | Admitting: Occupational Therapy

## 2022-02-05 ENCOUNTER — Encounter: Payer: HMO | Admitting: Occupational Therapy

## 2022-02-05 ENCOUNTER — Ambulatory Visit: Payer: HMO | Admitting: Physical Therapy

## 2022-02-07 ENCOUNTER — Ambulatory Visit: Payer: HMO | Admitting: Physical Therapy

## 2022-02-07 ENCOUNTER — Encounter: Payer: HMO | Admitting: Occupational Therapy

## 2022-02-12 ENCOUNTER — Ambulatory Visit: Payer: HMO | Admitting: Physical Therapy

## 2022-02-12 ENCOUNTER — Encounter: Payer: HMO | Admitting: Occupational Therapy

## 2022-02-13 ENCOUNTER — Encounter: Payer: Self-pay | Admitting: Adult Health

## 2022-02-13 ENCOUNTER — Ambulatory Visit (INDEPENDENT_AMBULATORY_CARE_PROVIDER_SITE_OTHER): Payer: HMO | Admitting: Adult Health

## 2022-02-13 ENCOUNTER — Ambulatory Visit (INDEPENDENT_AMBULATORY_CARE_PROVIDER_SITE_OTHER): Payer: HMO

## 2022-02-13 VITALS — BP 134/78 | HR 70 | Temp 98.0°F | Ht 69.0 in | Wt 174.6 lb

## 2022-02-13 DIAGNOSIS — M545 Low back pain, unspecified: Secondary | ICD-10-CM | POA: Diagnosis not present

## 2022-02-13 MED ORDER — TRAMADOL HCL 50 MG PO TABS: 50.0000 mg | ORAL_TABLET | Freq: Two times a day (BID) | ORAL | 0 refills | Status: AC

## 2022-02-13 NOTE — Progress Notes (Signed)
Subjective:    Patient ID: Russell Austin, male    DOB: Feb 14, 1951, 72 y.o.   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  He presents to the office today for worsening low back pain.  Roughly 6 to 8 weeks ago he was seen after a fall which resulted in right-sided rib fracture.  Since that fall he reports that his back pain has been worse.  Back pain is all lower back and is described as a sharp pain that is worse with sitting and movement.  He does report having 2 falls over the last month where he lost his balance.  First fall was into a bush and the second fall was to his bathroom floor.   No issues with bowel or bladder.  At home he has been using prescribed tramadol which helps alleviate some of his discomfort.  He does need a refill of this medication.  His last MRI in 10/2018 showed  IMPRESSION: In this patient with left radicular pain, the most suspicious abnormality is at L5-S1 where there is a broad-based, left posterolateral predominant disc herniation with potential to compress in particular the left S1 nerve in the lateral recess.   L4-5 shows bulging of the disc with focal prominence in the left foraminal region which would have some potential to affect the left L4 nerve as it exits, though definite compression is not established. 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   L3-4 shows annular bulging with annular fissures. Mild narrowing of the lateral recesses but no distinct neural compression.    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   Diabetes mellitus type II    Diabetic retinopathy  FOLLOWED BY DR Zadie Rhine   ED (erectile dysfunction)    Glaucoma of both eyes    Hypertension    Left hydrocele    Stroke [facility])     Social History   Socioeconomic History   Marital status: Married    Spouse name: Not on file   Number of children: 1   Years of education: 12   Highest education level: Not on file  Occupational History   Occupation: Disabled  Tobacco Use   Smoking status: Never   Smokeless tobacco: Never  Substance and Sexual Activity   Alcohol use: No   Drug use: No   Sexual activity: Not on file  Other Topics Concern   Not on file  Social History Narrative   Lives at home with his wife and granddaughter.   Left-handed.   3 cups caffeine per day.   Social Determinants of Health   Financial Resource Strain: Low Risk  (01/17/2022)   Overall Financial Resource Strain (CARDIA)    Difficulty of Paying Living Expenses: Not hard at all  Food Insecurity: No Food Insecurity (01/17/2022)   Hunger Vital Sign    Worried About Running Out of Food in the Last Year: Never true    Ran Out of Food in the Last Year: Never true  Transportation Needs: No Transportation Needs (01/17/2022)   PRAPARE - Hydrologist (Medical): No    Lack of Transportation (Non-Medical): No  Physical Activity: Inactive (01/17/2022)   Exercise Vital Sign    Days of Exercise per Week: 0 days    Minutes of Exercise per Session: 0 min  Stress: No Stress Concern Present (01/17/2022)   [place]    Feeling of Stress : Not at all  Social Connections: [place] (01/17/2022)   Social Connection and Isolation Panel [NHANES]    Frequency of Communication with Friends and Family: More than three times a week    Frequency of Social Gatherings with Friends and Family: More than three times a week    Attends Religious Services: More than 4 times per year    Active Member of Clubs or Organizations: Yes     Attends Archivist Meetings: More than 4 times per year    Marital Status: Married  Human resources officer Violence: Not At Risk (01/17/2022)   Humiliation, Afraid, Rape, and Kick questionnaire    Fear of Current or Ex-Partner: No    Emotionally Abused: No    Physically Abused: No    Sexually Abused: No    Past Surgical History:  Procedure Laterality Date   APPENDECTOMY  AGE EARLY 20'S   COLONOSCOPY  12/30/2020   every 5 years   HYDROCELE EXCISION  03/31/2012   Procedure: HYDROCELECTOMY ADULT;  Surgeon: Franchot Gallo, MD;  Location: [facility];  Service: Urology;  Laterality: Left;  72 MINS     LEFT EYE LASER RETINA REPAIR  SEPT 2012   RIGHT EYE VITRECTOMY/ INSERTION GLAUCOMA SETON/ LASER REPAIR  12-13-2008   RETINAL ARTERY OCCLUSION /NEOVASCULAR GLAUCOMA/ HEMORRHAGE   RIGHT EYE VITRETOMY/ INSERTION GLAUCOMA SETON X2/ LASER  03-24-2009   RECURRENT HEMORRHAGE/ OCCLUSION INTERNAL SETON   SHOULDER ARTHROSCOPY Right 2005   undescended right testicle removed  1994    Family History  Problem Relation Age of Onset   Glaucoma Mother    Diabetes Mother    Stomach cancer Maternal Grandfather    Stroke Maternal Grandmother     Allergies  Allergen Reactions   Lactose Intolerance (Gi) Diarrhea   Apple Pectin [Pectin] Itching    ITCHY THROAT   Metformin And Related     D/t decreased kidney function    Peach [Prunus Persica] Itching    ITCHY THROAT   Morphine And Related Anxiety    Jittery    Current Outpatient Medications on File Prior to Visit  Medication Sig Dispense Refill   amLODipine (NORVASC) 10 MG tablet Take 1 tablet (10 mg total) by mouth daily. 90 tablet 2   aspirin EC 81 MG tablet Take 81 mg by mouth daily.     atorvastatin (LIPITOR) 10 MG tablet Take 1 tablet (10 mg total) by mouth daily. 90 tablet 3   budesonide-formoterol (SYMBICORT) 160-4.5 MCG/ACT inhaler Inhale 2 puffs into the lungs in the morning and at bedtime. 3 each 3   buPROPion  (WELLBUTRIN SR) 150 MG 12 hr tablet TAKE ONE TABLET BY MOUTH EVERY MORNING 90 tablet 0   chlorthalidone (HYGROTON) 25 MG tablet Take 12.5 mg by mouth daily.     Continuous Blood Gluc Receiver (FREESTYLE LIBRE 14 DAY READER) DEVI Use with freestyle libre system 1 each 0   Continuous Blood Gluc Sensor (FREESTYLE LIBRE 14 DAY SENSOR) MISC Use with libre system 6 each 3   gabapentin (NEURONTIN) 300 MG capsule TAKE ONE CAPSULE BY MOUTH EVERYDAY AT BEDTIME 90 capsule 1   glipiZIDE (GLUCOTROL XL) 2.5 MG 24 hr tablet  TAKE ONE TABLET BY MOUTH EVERY MORNING and TAKE ONE TABLET BY MOUTH EVERY EVENING 180 tablet 0   insulin glargine (LANTUS SOLOSTAR) 100 UNIT/ML Solostar Pen Inject 10 Units into the skin daily. 9 mL 0   Insulin Pen Needle (B-D ULTRAFINE III SHORT PEN) 31G X 8 MM MISC USE WITH LANUTS PEN 100 each 3   losartan (COZAAR) 100 MG tablet Take 1 tablet (100 mg total) by mouth every morning. 90 tablet 1   metoprolol tartrate (LOPRESSOR) 50 MG tablet Take 1 tablet (50 mg total) by mouth 2 (two) times daily. 180 tablet 3   tiZANidine (ZANAFLEX) 2 MG tablet TAKE ONE TABLET BY MOUTH EVERYDAY AT BEDTIME 90 tablet 1   No current facility-administered medications on file prior to visit.    BP 134/78 (BP Location: Right Arm, Patient Position: Sitting, Cuff Size: Normal)   Pulse 70   Temp 98 F (36.7 C) (Oral)   Ht 5\' 9"  (1.753 m)   Wt 174 lb 9.6 oz (79.2 kg)   SpO2 98%   BMI 25.78 kg/m       Objective:   Physical Exam Vitals and nursing note reviewed.  Constitutional:      Appearance: Normal appearance.  Cardiovascular:     Rate and Rhythm: Normal rate and regular rhythm.     Pulses: Normal pulses.     Heart sounds: Normal heart sounds.  Pulmonary:     Effort: Pulmonary effort is normal.     Breath sounds: Normal breath sounds.  Musculoskeletal:        General: Tenderness present.     Lumbar back: Tenderness and bony tenderness present.     Comments: Spinal tenderness L2-L5   Skin:     General: Skin is warm and dry.  Neurological:     Mental Status: He is alert.        Assessment & Plan:  1. Acute midline low back pain without sciatica -He is quite tender with palpation along his lumbar spine.  No paraspinal muscle tenderness.  No radiculopathy noted.  We will start with lumbar spine to rule out compression fracture and advised that we may need to repeat his MRI. - DG Lumbar Spine Complete; Future - traMADol (ULTRAM) 50 MG tablet; Take 1 tablet (50 mg total) by mouth 2 (two) times daily for 5 days.  Dispense: 30 tablet; Refill: 0  [name], NP

## 2022-02-14 ENCOUNTER — Encounter: Payer: HMO | Admitting: Occupational Therapy

## 2022-02-14 ENCOUNTER — Ambulatory Visit: Payer: HMO | Admitting: Physical Therapy

## 2022-02-14 ENCOUNTER — Other Ambulatory Visit: Payer: Self-pay | Admitting: Adult Health

## 2022-02-14 DIAGNOSIS — M545 Low back pain, unspecified: Secondary | ICD-10-CM

## 2022-02-15 ENCOUNTER — Telehealth: Payer: HMO

## 2022-02-16 ENCOUNTER — Other Ambulatory Visit: Payer: Self-pay | Admitting: Adult Health

## 2022-02-16 ENCOUNTER — Ambulatory Visit (INDEPENDENT_AMBULATORY_CARE_PROVIDER_SITE_OTHER): Payer: HMO

## 2022-02-16 DIAGNOSIS — G8191 Hemiplegia, unspecified affecting right dominant side: Secondary | ICD-10-CM

## 2022-02-16 DIAGNOSIS — I10 Essential (primary) hypertension: Secondary | ICD-10-CM

## 2022-02-16 DIAGNOSIS — E1140 Type 2 diabetes mellitus with diabetic neuropathy, unspecified: Secondary | ICD-10-CM

## 2022-02-16 DIAGNOSIS — R2681 Unsteadiness on feet: Secondary | ICD-10-CM

## 2022-02-16 DIAGNOSIS — Z8673 Personal history of transient ischemic attack (TIA), and cerebral infarction without residual deficits: Secondary | ICD-10-CM

## 2022-02-16 NOTE — Chronic Care Management (AMB) (Signed)
Chronic Care Management   CCM RN Visit Note  02/16/2022 Name: Russell Austin MRN: [id number] DOB: Nov 25, 1950  Subjective: HOA Russell Austin is a 71 y.o. year old male who is a primary care patient of [name], NP. The care management team was consulted for assistance with disease management and care coordination needs.    Engaged with patient by telephone for follow up visit in response to provider referral for case management and/or care coordination services.   Consent to Services:  The patient was given information about Chronic Care Management services, agreed to services, and gave verbal consent prior to initiation of services.  Please see initial visit note for detailed documentation.   Patient agreed to services and verbal consent obtained.   Assessment: Review of patient past medical history, allergies, medications, health status, including review of consultants reports, laboratory and other test data, was performed as part of comprehensive evaluation and provision of chronic care management services.   SDOH (Social Determinants of Health) assessments and interventions performed:    CCM Care Plan  Allergies  Allergen Reactions   Lactose Intolerance (Gi) Diarrhea   Apple Pectin [Pectin] Itching    ITCHY THROAT   Metformin And Related     D/t decreased kidney function    Peach [Prunus Persica] Itching    ITCHY THROAT   Morphine And Related Anxiety    Jittery    Outpatient Encounter Medications as of 02/16/2022  Medication Sig   amLODipine (NORVASC) 10 MG tablet Take 1 tablet (10 mg total) by mouth daily.   aspirin EC 81 MG tablet Take 81 mg by mouth daily.   atorvastatin (LIPITOR) 10 MG tablet Take 1 tablet (10 mg total) by mouth daily.   budesonide-formoterol (SYMBICORT) 160-4.5 MCG/ACT inhaler Inhale 2 puffs into the lungs in the morning and at bedtime.   buPROPion (WELLBUTRIN SR) 150 MG 12 hr tablet TAKE ONE TABLET BY MOUTH EVERY MORNING    chlorthalidone (HYGROTON) 25 MG tablet Take 12.5 mg by mouth daily.   Continuous Blood Gluc Receiver (FREESTYLE LIBRE 14 DAY READER) DEVI Use with freestyle libre system   Continuous Blood Gluc Sensor (FREESTYLE LIBRE 14 DAY SENSOR) MISC Use with libre system   gabapentin (NEURONTIN) 300 MG capsule TAKE ONE CAPSULE BY MOUTH EVERYDAY AT BEDTIME   glipiZIDE (GLUCOTROL XL) 2.5 MG 24 hr tablet TAKE ONE TABLET BY MOUTH EVERY MORNING and TAKE ONE TABLET BY MOUTH EVERY EVENING   insulin glargine (LANTUS SOLOSTAR) 100 UNIT/ML Solostar Pen Inject 10 Units into the skin daily.   Insulin Pen Needle (B-D ULTRAFINE III SHORT PEN) 31G X 8 MM MISC USE WITH LANUTS PEN   losartan (COZAAR) 100 MG tablet Take 1 tablet (100 mg total) by mouth every morning.   metoprolol tartrate (LOPRESSOR) 50 MG tablet Take 1 tablet (50 mg total) by mouth 2 (two) times daily.   tiZANidine (ZANAFLEX) 2 MG tablet TAKE ONE TABLET BY MOUTH EVERYDAY AT BEDTIME   traMADol (ULTRAM) 50 MG tablet Take 1 tablet (50 mg total) by mouth 2 (two) times daily for 5 days.   No facility-administered encounter medications on file as of 02/16/2022.    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)    CKD stage 2 due to type 2 diabetes mellitus ([place]) 02/12/2015   Right hemiparesis ([place])    CKD (chronic kidney disease) stage 5, GFR less than 15 ml/min ([place]) 02/10/2015   Type 2 diabetes mellitus with diabetic neuropathy ([place]) 02/10/2015   Neck pain    Numbness  and tingling 02/09/2015   Left shoulder pain 07/17/2012   Plantar fasciitis, bilateral 07/17/2012   Peripheral neuropathy 11/06/2007   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   Essential hypertension 07/08/2007   EXTRINSIC ASTHMA, UNSPECIFIED 06/25/2007    Conditions to be addressed/monitored:HTN, HLD, DMII, Osteoarthritis, and falls  Care Plan : RN Care Manager Plan of Care  Updates made by [name], RN since 02/16/2022 12:00 AM     Problem: Chronic Disease Management and Care Coordination Needs (DM2,HTN and HLD   Priority: High     Long-Range Goal: Establish Plan of Care for Chronic Disease Management Needs (DM2,HTN and HLD)   Start Date: 07/17/2021  Expected End Date: 08/15/2022  Recent Progress: On track  Priority: High  Note:   Current Barriers:  Knowledge Deficits related to plan of care for management of HTN, HLD, and DMII Chronic Disease Management support and education needs related to HTN, HLD, and DMII States he has had several falls and he fx a rt rib.  States he lost his balance in the bathroom.  States sometimes his feet do not pick up as they should.  States he is trying to use his cane. States he had to stop going to PT due to his pain but he plans to go back when his is feeling better. States he is  taking his Lantus insulin every day now. States he is scanning his Freestyle [place] once a day ranges from 68-150. States he did not feel low when his reading was 68. States he has not been scanning his sugars later in the day.  States his wife is preparing healthy meals and he is eating a snack at bedtime to avoid having a low during the night. States he has been walking about 3 days a week for about 20 minutes weather permitting States his B/Ps have been good when he goes to the doctor but he has not been checking at home much.  States he continues having pain in his rt side when he gets up in the morning which goes away as the day goes by.  States he sleeps on his lt side or  back only.   States his chronic pain in his hand is unchanged.   RNCM Clinical Goal(s):  Patient will verbalize understanding of plan for management of HTN, HLD, and DMII as evidenced by voiced adherence to plan of care verbalize basic understanding of  HTN, HLD, and DMII disease process and self health management plan as evidenced by voiced understanding and teach back take all medications exactly as prescribed and will call provider for medication related questions as evidenced by dispense report and pt verbalization  attend all scheduled medical appointments: CCM Pharm D 02/23/22, [name] NP 03/20/22, Annual Wellness Visit 01/21/23, Podiatry 04/09/22 as evidenced by medical records demonstrate Improved adherence to prescribed treatment plan for HTN, HLD, and DMII as evidenced by readings within limits, adherence to plan of care continue to work with RN Care Manager to address care management and care coordination needs  related to  HTN, HLD, and DMII as evidenced by adherence to CM Team Scheduled appointments through collaboration with RN Care manager, provider, and care team.   Interventions: 1:1 collaboration with primary care provider regarding development and update of comprehensive plan of care as evidenced by provider attestation and co-signature Inter-disciplinary care team collaboration (see longitudinal plan of care) Evaluation of current treatment plan related to  self management and patient's adherence to plan as established by provider    Falls Interventions:  (Status:  New goal.) Long Term Goal Provided written and verbal education re: potential causes of falls and Fall prevention strategies Advised patient of importance of notifying provider of falls Assessed for signs and symptoms of orthostatic hypotension Advised patient to discuss need for walker with provider Communicated to provider to consider ordering a walker for pt.  Reviewed to use cane at all times and to get up  slowly    Hyperlipidemia Interventions:  (Status:  Condition stable.  Not addressed this visit.) Long Term Goal Medication review performed; medication list updated in electronic medical record.  Provider established cholesterol goals reviewed Counseled on importance of regular laboratory monitoring as prescribed Reviewed role and benefits of statin for ASCVD risk reduction Discussed strategies to manage statin-induced myalgias Reviewed importance of limiting foods high in cholesterol Reviewed exercise goals and target of 150 minutes per week  Hypertension Interventions:  (Status:  Goal on track:  Yes.) Long Term Goal Last practice recorded BP readings:  BP Readings from Last 3 Encounters:  02/13/22 134/78  01/11/22 140/80  01/08/22 (!) 162/84  Most recent eGFR/CrCl: No results found for: EGFR  No components found for: CRCL  Evaluation of current treatment plan related to hypertension self management and patient's adherence to plan as established by provider Provided education to patient re: stroke prevention, s/s of heart attack and stroke Reviewed medications with patient and discussed importance of compliance Discussed plans with patient for ongoing care management follow up and provided patient with direct contact information for care management team Advised patient, providing education and rationale, to monitor blood pressure daily and record, calling PCP for findings outside established parameters Provided education on prescribed diet low sodium low CHO Discussed complications of poorly controlled blood pressure such as heart disease, stroke, circulatory complications, vision complications, kidney impairment, sexual dysfunction Reviewed to start taking his B/P at home again. Reinforced to try taking his B/P after he has taken his B/P medications and to contact his provider if elevated.      Diabetes Interventions:(Status:  Goal on track:  Yes.)  Long Term Goal Assessed patient's  understanding of A1c goal: <7% Provided education to patient about basic DM disease process Reviewed medications with patient and discussed importance of medication adherence Counseled on importance of regular laboratory monitoring as prescribed Reviewed scheduled/upcoming provider appointments including: CCM Pharm D 02/23/22, [name] NP 03/20/22, Annual Wellness Visit 01/21/23, Podiatry 04/09/22  Advised patient, providing education and rationale, to check cbg scan 3 times a day and record, calling provider for findings outside established parameters  Reviewed to take his Lantus insulin daily around the same time. Reinforced goals for time in range and how that effects his A1C value,  Reinforced s/sx of hypoglycemia and how to treat and importance to having bedtime snack Lab Results  Component Value Date   HGBA1C 7.7 (H) 12/19/2021  Pain Interventions:  (Status:  Goal on track:  Yes.) Long Term Goal Pain assessment performed Medications reviewed Reviewed provider established plan for pain management Discussed  importance of adherence to all scheduled medical appointments Counseled on the importance of reporting any/all new or changed pain symptoms or management strategies to pain management provider Discussed use of relaxation techniques and/or diversional activities to assist with pain reduction (distraction, imagery, relaxation, massage, acupressure, TENS, heat, and cold application Reviewed with patient prescribed pharmacological and nonpharmacological pain relief strategies  Reviewed to resume PT when he is able. Reinforced to try to change position at night when sleeping and to try to use pillows to help him stay in position    Patient Goals/Self-Care Activities: Take all medications as prescribed Attend all scheduled provider appointments Call pharmacy for medication refills 3-7 days in advance of running out of medications Call provider office for new concerns or questions  keep  appointment with eye doctor check blood sugar at prescribed times: three times daily and when you have symptoms of low or high blood sugar check feet daily for cuts, sores or redness trim toenails straight across drink 6 to 8 glasses of water each day fill half of plate with vegetables limit fast food meals to no more than 1 per week manage portion size switch to sugar-free drinks keep feet up while sitting check blood pressure daily choose a place to take my blood pressure (home, clinic or office, retail store) call doctor for signs and symptoms of high blood pressure keep all doctor appointments take medications for blood pressure exactly as prescribed eat more whole grains, fruits and vegetables, lean meats and healthy fats limit salt intake to 2324m/day call for medicine refill 2 or 3 days before it runs out take all medications exactly as prescribed call doctor with any symptoms you believe are related to your medicine Follow Up Plan:  Telephone follow up appointment with care management team member scheduled for:  03/23/22 The patient has been provided with contact information for the care management team and has been advised to call with any health related questions or concerns.       Plan:Telephone follow up appointment with care management team member scheduled for:  03/23/22 The patient has been provided with contact information for the care management team and has been advised to call with any health related questions or concerns.  MPeter GarterRN, BJackquline Denmark CDE Care Management Coordinator [facility] ([phone]

## 2022-02-16 NOTE — Patient Instructions (Signed)
Visit Information  Thank you for taking time to visit with me today. Please don't hesitate to contact me if I can be of assistance to you before our next scheduled telephone appointment.  Following are the goals we discussed today:  Take all medications as prescribed Attend all scheduled provider appointments Call pharmacy for medication refills 3-7 days in advance of running out of medications Call provider office for new concerns or questions  keep appointment with eye doctor check blood sugar at prescribed times: three times daily and when you have symptoms of low or high blood sugar check feet daily for cuts, sores or redness trim toenails straight across drink 6 to 8 glasses of water each day fill half of plate with vegetables limit fast food meals to no more than 1 per week manage portion size switch to sugar-free drinks keep feet up while sitting check blood pressure daily choose a place to take my blood pressure (home, clinic or office, retail store) call doctor for signs and symptoms of high blood pressure keep all doctor appointments take medications for blood pressure exactly as prescribed eat more whole grains, fruits and vegetables, lean meats and healthy fats limit salt intake to 2300mg /day call for medicine refill 2 or 3 days before it runs out take all medications exactly as prescribed call doctor with any symptoms you believe are related to your medicine  Our next appointment is by telephone on 03/23/22 at 9 AM  Please call the care guide team at [phone] if you need to cancel or reschedule your appointment.   If you are experiencing a Mental Health or [place] or need someone to talk to, please call the Suicide and Crisis Lifeline: 988 call the [place] National Suicide Prevention Lifeline: 367 232 6679 or TTY: 905-382-4246 TTY 4013317291) to talk to a trained counselor call 1-800-273-TALK (toll free, 24 hour hotline) go to [facility] [address], [place] [phone]) call 911   Patient verbalizes understanding of instructions and care plan provided today and agrees to view in [place]. Active MyChart status and patient understanding of how to access instructions and care plan via MyChart confirmed with patient.     Peter Garter RN, Jackquline Denmark, CDE Care Management Coordinator [facility] [phone]

## 2022-02-20 ENCOUNTER — Telehealth: Payer: Self-pay | Admitting: Adult Health

## 2022-02-20 NOTE — Telephone Encounter (Signed)
Info for DME provider for patient's walker should be sent to [facility] [phone]

## 2022-02-20 NOTE — Telephone Encounter (Signed)
Noted! Information sent to place listed below.

## 2022-02-22 ENCOUNTER — Ambulatory Visit: Payer: HMO | Admitting: Physical Therapy

## 2022-02-22 ENCOUNTER — Telehealth: Payer: Self-pay | Admitting: Pharmacist

## 2022-02-22 NOTE — Progress Notes (Signed)
Chronic Care Management Pharmacy Note  02/23/2022 Name:  Russell Austin MRN:  [id number] DOB:  Feb 07, 1951  Summary: BP not at goal < 130/80 per office readings A1c not at goal < 7%  Recommendations/Changes made from today's visit: -Recommended bringing BP cuff to next office visit to ensure accuracy -Recommended setting an alarm on his phone for remembering to check BP at home (at least twice a week to start) -Recommended switching to [employer] to manage blood sugars easier/virtually  Plan: BP follow up in 1 month Follow up in 2 months to get connected with [employer]   Subjective: Russell Austin is an 71 y.o. year old male who is a primary patient of Shirline Frees, NP.  The CCM team was consulted for assistance with disease management and care coordination needs.    Engaged with patient by telephone for follow up visit in response to provider referral for pharmacy case management and/or care coordination services.   Consent to Services:  The patient was given information about Chronic Care Management services, agreed to services, and gave verbal consent prior to initiation of services.  Please see initial visit note for detailed documentation.   Patient Care Team: Shirline Frees, NP as PCP - General (Family Medicine) Irena Cords, Enzo Montgomery, MD as Consulting Physician (Allergy and Immunology) [facility] (Ophthalmology) Wynell Balloon, MD (Ophthalmology) Maurice Small Clovis Pu, MD as Consulting Physician (Neurosurgery) Valentino Nose Bernestine Amass, MD as Consulting Physician (Nephrology) [facility] (Plastic Surgery) Verner Chol, [facility] as Pharmacist (Pharmacist) Yetta Glassman, RN as Case Manager  Recent office visits: 02/13/22 Shirline Frees, NP: Patient presented for back pain. Refill tramadol. Plan for lumbar spine to rule out compression fracture.  01/17/22 Theresa Mulligan, LPN: Patient presented for  AWV.  01/11/22 Shirline Frees, NP: Patient presented for rib fracture. Prescribed tramadol PRN for pain.  01/08/22 Berniece Andreas, MD: Patient presented for flank pain. Plan for x-ray and PCP follow up.  12/21/21 Yetta Glassman - Patient presented for [facility] nurse visit. No medication changes.   12/19/21 Shirline Frees, NP - Patient presented for Routine general medical exam and other concerns. Decreased Insulin Glargine. Changed Mometasone. Stopped Diclofenac.  10/25/21 Burchette, Elberta Fortis, MD: Patient presented for back pain. Consider doubling up on Zanaflex but use with caution. Consider PT if symptoms do not improve.  09/11/21 Yetta Glassman RN -  Patient presented for CCM Visit with Nurse.  Recent consult visits: 01/01/22 Anette Guarneri, PT [facility]): Patient presented for PT session for unsteadiness on feet.  12/27/21 Geralynn Rile, DPM (podiatry): Patient presented for nail trim.  09/26/21 Geralynn Rile, DPM (podiatry): Patient presented for nail trim.  08/30/21 Steele Sizer (Ophthalmology) - Patient presented for Anophthalmos of right eye and other concerns. No medication changes noted.  07/12/21 Marlowe Aschoff, DPM (podiatry): Patient presented to obtain diabetic shoes.  06/19/21 Geralynn Rile, DPM (podiatry): Patient presented for nail trim.  Hospital visits: None in previous 6 months   Objective:  Lab Results  Component Value Date   CREATININE 3.52 (H) 12/19/2021   BUN 44 (H) 12/19/2021   GFR 16.87 (L) 12/19/2021   GFRNONAA 41 (L) 10/28/2018   GFRAA 47 (L) 10/28/2018   NA 140 12/19/2021   K 4.9 12/19/2021   CALCIUM 9.0 12/19/2021   CO2 25 12/19/2021   GLUCOSE 170 (H) 12/19/2021    Lab Results  Component Value Date/Time   HGBA1C 7.7 (H) 12/19/2021 07:41 AM   HGBA1C 7.1 (A)  09/08/2021 07:12 AM   HGBA1C 6.4 (A) 06/08/2021 07:14 AM   HGBA1C 7.1 (H) 12/09/2020 08:04 AM   HGBA1C 7.3 (A) 02/11/2020 07:09 AM   HGBA1C 6.6 07/23/2018 09:00  AM   GFR 16.87 (L) 12/19/2021 07:41 AM   GFR 15.40 (L) 12/09/2020 08:04 AM   MICROALBUR 50.8 (H) 12/13/2014 08:01 AM   MICROALBUR 27.7 (H) 12/16/2013 09:38 AM    Last diabetic Eye exam:  Lab Results  Component Value Date/Time   HMDIABEYEEXA Retinopathy (A) 04/17/2021 12:00 AM    Last diabetic Foot exam:  Lab Results  Component Value Date/Time   HMDIABFOOTEX done 02/02/2010 12:00 AM     Lab Results  Component Value Date   CHOL 118 12/19/2021   HDL 31.60 (L) 12/19/2021   LDLCALC 62 12/19/2021   TRIG 123.0 12/19/2021   CHOLHDL 4 12/19/2021       Latest Ref Rng & Units 12/19/2021    7:41 AM 12/09/2020    8:04 AM 11/11/2019    7:44 AM  Hepatic Function  Total Protein 6.0 - 8.3 g/dL 6.5  6.3  5.8   Albumin 3.5 - 5.2 g/dL 3.7  3.7  3.4   AST 0 - 37 U/L 20  17  16    ALT 0 - 53 U/L 19  16  17    Alk Phosphatase 39 - 117 U/L 145  135  110   Total Bilirubin 0.2 - 1.2 mg/dL 0.9  1.0  1.0     Lab Results  Component Value Date/Time   TSH 4.86 12/19/2021 07:41 AM   TSH 2.03 12/09/2020 08:04 AM       Latest Ref Rng & Units 12/19/2021    7:41 AM 12/09/2020    8:04 AM 11/11/2019    7:44 AM  CBC  WBC 4.0 - 10.5 K/uL 4.0  3.7  4.2   Hemoglobin 13.0 - 17.0 g/dL 64.4  03.4  74.2   Hematocrit 39.0 - 52.0 % 35.4  37.0  34.9   Platelets 150.0 - 400.0 K/uL 248.0  258.0  270.0     No results found for: "VD25OH"  Clinical ASCVD: Yes  The ASCVD Risk score (Arnett DK, et al., 2019) failed to calculate for the following reasons:   The valid total cholesterol range is 130 to 320 mg/dL       5/95/6387   56:43 PM 01/08/2022    1:44 PM 09/08/2021    7:13 AM  Depression screen PHQ 2/9  Decreased Interest 0 0 0  Down, Depressed, Hopeless 0 0 0  PHQ - 2 Score 0 0 0  Altered sleeping 0 0   Tired, decreased energy 0 0   Change in appetite 0 0   Feeling bad or failure about yourself  0 0   Trouble concentrating 0 1   Moving slowly or fidgety/restless 0 0   Suicidal thoughts 0 0   PHQ-9  Score 0 1   Difficult doing work/chores Not difficult at all Not difficult at all       Social History   Tobacco Use  Smoking Status Never  Smokeless Tobacco Never   BP Readings from Last 3 Encounters:  02/13/22 134/78  01/11/22 140/80  01/08/22 (!) 162/84   Pulse Readings from Last 3 Encounters:  02/13/22 70  01/11/22 66  01/08/22 83   Wt Readings from Last 3 Encounters:  02/13/22 174 lb 9.6 oz (79.2 kg)  01/17/22 185 lb (83.9 kg)  01/08/22 185 lb (83.9 kg)  BMI Readings from Last 3 Encounters:  02/13/22 25.78 kg/m  01/17/22 27.32 kg/m  01/08/22 27.32 kg/m    Assessment/Interventions: Review of patient past medical history, allergies, medications, health status, including review of consultants reports, laboratory and other test data, was performed as part of comprehensive evaluation and provision of chronic care management services.   SDOH:  (Social Determinants of Health) assessments and interventions performed: No  SDOH Screenings   Alcohol Screen: Low Risk  (01/17/2022)   Alcohol Screen    Last Alcohol Screening Score (AUDIT): 0  Depression (PHQ2-9): Low Risk  (01/17/2022)   Depression (PHQ2-9)    PHQ-2 Score: 0  Financial Resource Strain: Low Risk  (01/17/2022)   Overall Financial Resource Strain (CARDIA)    Difficulty of Paying Living Expenses: Not hard at all  Food Insecurity: No Food Insecurity (01/17/2022)   Hunger Vital Sign    Worried About Running Out of Food in the Last Year: Never true    Ran Out of Food in the Last Year: Never true  Housing: Low Risk  (01/17/2022)   Housing    Last Housing Risk Score: 0  Physical Activity: Inactive (01/17/2022)   Exercise Vital Sign    Days of Exercise per Week: 0 days    Minutes of Exercise per Session: 0 min  Social Connections: Socially Integrated (01/17/2022)   Social Connection and Isolation Panel [NHANES]    Frequency of Communication with Friends and Family: More than three times a week    Frequency of  Social Gatherings with Friends and Family: More than three times a week    Attends Religious Services: More than 4 times per year    Active Member of [employer] or Organizations: Yes    Attends Banker Meetings: More than 4 times per year    Marital Status: Married  Stress: No Stress Concern Present (01/17/2022)   [employer] of Occupational Health - Occupational Stress Questionnaire    Feeling of Stress : Not at all  Tobacco Use: Low Risk  (02/16/2022)   Patient History    Smoking Tobacco Use: Never    Smokeless Tobacco Use: Never    Passive Exposure: Not on file  Transportation Needs: No Transportation Needs (01/17/2022)   PRAPARE - Transportation    Lack of Transportation (Medical): No    Lack of Transportation (Non-Medical): No    CCM Care Plan  Allergies  Allergen Reactions   Lactose Intolerance (Gi) Diarrhea   Apple Pectin [Pectin] Itching    ITCHY THROAT   Metformin And Related     D/t decreased kidney function    Peach [Prunus Persica] Itching    ITCHY THROAT   Morphine And Related Anxiety    Jittery    Medications Reviewed Today     Reviewed by Verner Chol, RPH (Pharmacist) on 02/23/22 at 1147  Med List Status: <None>   Medication Order Taking? Sig Documenting Provider Last Dose Status Informant  amLODipine (NORVASC) 10 MG tablet [id number] No Take 1 tablet (10 mg total) by mouth daily. Nafziger, Kandee Keen, NP Taking Active   aspirin EC 81 MG tablet [id number] No Take 81 mg by mouth daily. [provider] Taking Active   atorvastatin (LIPITOR) 10 MG tablet [id number] No Take 1 tablet (10 mg total) by mouth daily. Nafziger, Kandee Keen, NP Taking Active   budesonide-formoterol [facility]) 160-4.5 MCG/ACT inhaler [id number] No Inhale 2 puffs into the lungs in the morning and at bedtime. Nafziger, Kandee Keen, NP Taking Active   buPROPion [facility] SR)  150 MG 12 hr tablet [id number] No TAKE ONE TABLET BY MOUTH EVERY MORNING Nafziger, Kandee Keen, NP Taking Active    chlorthalidone (HYGROTON) 25 MG tablet [id number] No Take 12.5 mg by mouth daily. [provider] Taking Active   Continuous Blood Gluc Receiver (FREESTYLE LIBRE 14 DAY READER) DEVI [id number] No Use with freestyle libre system Nafziger, Show Low, NP Taking Active   Continuous Blood Gluc Sensor (FREESTYLE LIBRE 14 DAY SENSOR) [place] [id number] No Use with libre system Nafziger, Rosalia, NP Taking Active   gabapentin (NEURONTIN) 300 MG capsule [id number] No TAKE ONE CAPSULE BY MOUTH EVERYDAY AT BEDTIME Nafziger, Kandee Keen, NP Taking Active   glipiZIDE (GLUCOTROL XL) 2.5 MG 24 hr tablet [id number] No TAKE ONE TABLET BY MOUTH EVERY MORNING and TAKE ONE TABLET BY MOUTH EVERY EVENING Nafziger, Kandee Keen, NP Taking Active   insulin glargine (LANTUS SOLOSTAR) 100 UNIT/ML Solostar Pen [id number] No Inject 10 Units into the skin daily. Nafziger, Kandee Keen, NP Taking Active   Insulin Pen Needle (B-D ULTRAFINE III SHORT PEN) 31G X 8 MM MISC [id number] No USE WITH LANUTS PEN Nafziger, Kandee Keen, NP Taking Active   losartan (COZAAR) 100 MG tablet [id number] No Take 1 tablet (100 mg total) by mouth every morning. Nafziger, Kandee Keen, NP Taking Active   metoprolol tartrate (LOPRESSOR) 50 MG tablet [id number] No Take 1 tablet (50 mg total) by mouth 2 (two) times daily. Nafziger, Kandee Keen, NP Taking Active   tiZANidine (ZANAFLEX) 2 MG tablet [id number] No TAKE ONE TABLET BY MOUTH EVERYDAY AT BEDTIME Shirline Frees, NP Taking Active             Patient Active Problem List   Diagnosis Date Noted   Diabetes mellitus (HCC) 07/14/2020   Hyperlipidemia 07/14/2020   Osteoarthritis 07/14/2020   Plantar fasciitis 07/14/2020   Stasis edema of both lower extremities 01/01/2019   Primary osteoarthritis of left shoulder 03/20/2018   Spondylosis of 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 myelopathy or radiculopathy 03/20/2018   Complex regional pain syndrome I of upper limb 06/27/2015   Dysphagia following cerebral infarction 02/15/2015   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)    CKD stage 2 due to type 2 diabetes mellitus (HCC) 02/12/2015   Right hemiparesis (HCC)    CKD (chronic kidney disease) stage 5, GFR less than 15 ml/min (HCC) 02/10/2015   Type 2 diabetes mellitus with diabetic neuropathy (HCC) 02/10/2015   Neck pain    Numbness and tingling 02/09/2015   Left shoulder pain 07/17/2012   Plantar fasciitis, bilateral 07/17/2012   Peripheral neuropathy 11/06/2007   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   Essential hypertension 07/08/2007   EXTRINSIC ASTHMA, UNSPECIFIED 06/25/2007    Immunization History  Administered Date(s) Administered   Fluad Quad(high Dose 65+) 05/14/2019, 05/26/2020, 06/08/2021   Influenza Split 07/02/2011, 07/17/2012   Influenza, High Dose Seasonal PF 05/29/2017   Influenza,inj,Quad PF,6+ Mos 05/13/2013, 06/15/2015, 06/19/2016, 06/30/2018   Influenza-Unspecified 06/30/2018   PFIZER Comirnaty(Gray Top)Covid-19 Tri-Sucrose Vaccine 06/14/2021   PFIZER(Purple Top)SARS-COV-2 Vaccination 09/25/2019, 10/16/2019, 06/07/2020   Pneumococcal Conjugate-13 10/10/2017   Pneumococcal Polysaccharide-23 12/20/2014, 11/11/2019   Td 02/02/2010   Zoster Recombinat (Shingrix) 06/14/2021, 08/14/2021   Patient lost about 10 lbs intentionally because he was watching what he is eating recently. He has not been checking his BP at home regularly because he is lazy and forgets. Recommended setting an alarm on his phone.   Conditions to be addressed/monitored:  Hypertension, Hyperlipidemia, Diabetes, Chronic Kidney Disease, and Irritability and Pain  Conditions addressed this visit: Hypertension, CKD,  diabetes  Care Plan : CCM Pharmacy Care Plan  Updates made by Verner Chol, RPH since 02/23/2022 12:00 AM     Problem: Problem: Hypertension, Hyperlipidemia, Diabetes, Chronic Kidney Disease, and Irritability and Pain      Long-Range Goal: Patient-Specific Goal   Start Date: 04/19/2021  Expected End Date: 04/19/2022   Recent Progress: On track  Priority: High  Note:   Current Barriers:  Unable to independently monitor therapeutic efficacy Unable to achieve control of blood pressure  Unable to self administer medications as prescribed  Pharmacist Clinical Goal(s):  Patient will achieve adherence to monitoring guidelines and medication adherence to achieve therapeutic efficacy achieve control of blood pressure as evidenced by home readings  through collaboration with PharmD and provider.   Interventions: 1:1 collaboration with Shirline Frees, NP regarding development and update of comprehensive plan of care as evidenced by provider attestation and co-signature Inter-disciplinary care team collaboration (see longitudinal plan of care) Comprehensive medication review performed; medication list updated in electronic medical record  Hypertension (BP goal <130/80) -Uncontrolled -Current treatment: Amlodipine 10mg , 1 tablet once daily - Appropriate, Query effective, Safe, Accessible Losartan 100 mg 1 tablet once daily - Appropriate, Effective, Query Safe, Accessible Metoprolol tartrate 50mg , 1 tablet twice daily - Appropriate, Query effective, Safe, Accessible Chlorthalidone 25 mg 1/2 tablet daily - Appropriate, Query effective, Safe, Accessible -Medications previously tried: HCTZ (switched to chlorthalidone) -Current home readings: has been lazy with checking (this morning - arm cuff) -Current dietary habits: tries to limit salt some; does enjoy eating; Himalayan sea salt  - doesn't use canned and frozen vegetables - not eating out more often -Current exercise habits: sitting down more than usual - can do sit ups, could go for a -Denies hypotensive/hypertensive symptoms -Educated on Daily salt intake goal < 2300 mg; Exercise goal of 150 minutes per week; Importance of home blood pressure monitoring; Proper BP monitoring technique; -Counseled to monitor BP at home at least once weekly, document, and  provide log at future appointments -Counseled on diet and exercise extensively Recommended checking BP daily or every other day.  Hyperlipidemia: (LDL goal < 70) -Controlled -Current treatment: Atorvastatin 10mg , 1 tablet once daily - Appropriate, Effective, Safe, Accessible -Medications previously tried: none  -Current dietary patterns: did not discuss -Current exercise habits: did not discuss -Educated on Cholesterol goals;  Exercise goal of 150 minutes per week; -Counseled on diet and exercise extensively Recommended to continue current medication  Diabetes (A1c goal <7%) -Controlled -Current medications: Insulin glargine (Lantus Solostar), inject 10 units into skin daily - Appropriate, Effective, Query Safe, Accessible Glipizide ER 2.5 mg, 1 tablet in the morning and at bedtime (takes first thing) - Appropriate, Effective, Query Safe, Accessible -Medications previously tried: metformin (kidney function), Jardiance (kidney function) -Current home glucose readings fasting glucose: 78, 91 (not been > 150 in a while); lowest 60 - feels shaky post prandial glucose: not sure -Reports hypoglycemic/hyperglycemic symptoms -Current meal patterns:  breakfast: did not discuss  lunch: did not discuss   dinner: did not discuss  snacks: did not discuss  drinks: did not discuss  -Current exercise: did not discuss -Educated on A1c and blood sugar goals; Exercise goal of 150 minutes per week; -Counseled to check feet daily and get yearly eye exams -Counseled on diet and exercise extensively Recommended to continue current medication  Depression/Anxiety/Irritability (Goal: minimize symptoms) -Not ideally controlled -Current treatment: Bupropion 150 mg once daily - Appropriate, Effective, Safe, Accessible -Medications previously tried/failed: none -PHQ9: 0 -Educated on Benefits of  medication for symptom control -Recommended to continue current medication  Right hemiparesis  (Goal:  minimize symptoms) -Controlled -Current treatment  Gabapentin 300mg , 1 capsule at bedtime - Appropriate, Effective, Safe, Accessible Tizanidine 2mg , 1-2 tablets at bedtime - Appropriate, Effective, Safe, Accessible -Medications previously tried: cyclobenzaprine  -Recommended to continue current medication  History of stroke (Goal: prevent future strokes) -Controlled -Current treatment  Atorvastatin 10mg , 1 tablet once daily  - Appropriate, Effective, Safe, Accessible Aspirin 81mg , 1 tablet once daily - Appropriate, Effective, Safe, Accessible -Medications previously tried: none  -Recommended to continue current medication  Health Maintenance -Vaccine gaps: shingrix, COVID booster -Current therapy:  none -Educated on Cost vs benefit of each product must be carefully weighed by individual consumer -Patient is satisfied with current therapy and denies issues -Recommended to continue as is  Patient Goals/Self-Care Activities Patient will:  - take medications as prescribed check glucose with Freestyle libre, document, and provide at future appointments check blood pressure weekly, document, and provide at future appointments target a minimum of 150 minutes of moderate intensity exercise weekly  Follow Up Plan: The care management team will reach out to the patient again over the next 60 days.        Medication Assistance: None required.  Patient affirms current coverage meets needs.  Compliance/Adherence/Medication fill history: Care Gaps: BP - 118/80 (12/19/21) Last A1C - 7.7 on 4/18/223  Star-Rating Drugs: Losartan 100 mg - Last filled 01/06/22 90 DS at [facility] Atorvastatin 10 mg - Last filled 01/06/22 90 DS at [facility] Glipizide 2.5 mg - Last filled 01/06/22 90 DS at [facility]  Patient's preferred pharmacy is:  [facility] - [place], [place] - [address] [place] [address] Phone: [phone] Fax:  [phone]   Uses pill box? Yes Pt endorses 95% compliance  We discussed: Benefits of medication synchronization, packaging and delivery as well as enhanced pharmacist oversight with [facility]. Patient decided to: Utilize [facility] pharmacy for medication synchronization, packaging and delivery  Care Plan and Follow Up Patient Decision:  Patient agrees to Care Plan and Follow-up.  Plan: The care management team will reach out to the patient again over the next 60 days.  Gaylord Shih, PharmD, [facility] Clinical Pharmacist [facility] [phone]

## 2022-02-22 NOTE — Chronic Care Management (AMB) (Signed)
    Chronic Care Management Pharmacy Assistant   Name: Russell Austin  MRN: [id number] DOB: [birth year]  02/22/22 APPOINTMENT REMINDER   Patient was reminded to have all medications, supplements and any blood glucose and blood pressure readings available for review with [name], Pharm. D, for telephone visit on 6/23 at 11:15.    Care Gaps: BP- 118/80 (12/19/21) AWV- 5/22   Star Rating Drug: Losartan 100 mg - Last filled 10/02/21 90 DS at [facility] Atorvastatin 10 mg - Last filled 10/02/21 90 DS at [facility] Glipizide 2.5 mg - Last filled 10/02/21 90 DS at [facility]    Medications: Outpatient Encounter Medications as of 02/22/2022  Medication Sig   amLODipine (NORVASC) 10 MG tablet Take 1 tablet (10 mg total) by mouth daily.   aspirin EC 81 MG tablet Take 81 mg by mouth daily.   atorvastatin (LIPITOR) 10 MG tablet Take 1 tablet (10 mg total) by mouth daily.   budesonide-formoterol (SYMBICORT) 160-4.5 MCG/ACT inhaler Inhale 2 puffs into the lungs in the morning and at bedtime.   buPROPion (WELLBUTRIN SR) 150 MG 12 hr tablet TAKE ONE TABLET BY MOUTH EVERY MORNING   chlorthalidone (HYGROTON) 25 MG tablet Take 12.5 mg by mouth daily.   Continuous Blood Gluc Receiver (FREESTYLE LIBRE 14 DAY READER) DEVI Use with freestyle libre system   Continuous Blood Gluc Sensor (FREESTYLE LIBRE 14 DAY SENSOR) MISC Use with libre system   gabapentin (NEURONTIN) 300 MG capsule TAKE ONE CAPSULE BY MOUTH EVERYDAY AT BEDTIME   glipiZIDE (GLUCOTROL XL) 2.5 MG 24 hr tablet TAKE ONE TABLET BY MOUTH EVERY MORNING and TAKE ONE TABLET BY MOUTH EVERY EVENING   insulin glargine (LANTUS SOLOSTAR) 100 UNIT/ML Solostar Pen Inject 10 Units into the skin daily.   Insulin Pen Needle (B-D ULTRAFINE III SHORT PEN) 31G X 8 MM MISC USE WITH LANUTS PEN   losartan (COZAAR) 100 MG tablet Take 1 tablet (100 mg total) by mouth every morning.   metoprolol tartrate (LOPRESSOR) 50 MG tablet Take 1 tablet (50 mg total) by  mouth 2 (two) times daily.   tiZANidine (ZANAFLEX) 2 MG tablet TAKE ONE TABLET BY MOUTH EVERYDAY AT BEDTIME   No facility-administered encounter medications on file as of 02/22/2022.       [place] Clinical Pharmacist Assistant [phone]

## 2022-02-23 ENCOUNTER — Ambulatory Visit: Payer: HMO | Admitting: Physical Therapy

## 2022-02-23 ENCOUNTER — Other Ambulatory Visit: Payer: Self-pay | Admitting: Adult Health

## 2022-02-23 ENCOUNTER — Ambulatory Visit: Payer: HMO | Admitting: Pharmacist

## 2022-02-23 DIAGNOSIS — Z794 Long term (current) use of insulin: Secondary | ICD-10-CM

## 2022-02-23 DIAGNOSIS — I10 Essential (primary) hypertension: Secondary | ICD-10-CM

## 2022-02-23 MED ORDER — FREESTYLE LIBRE 3 SENSOR MISC: 1.0000 | 3 refills | Status: AC

## 2022-02-27 ENCOUNTER — Ambulatory Visit
Admission: RE | Admit: 2022-02-27 | Discharge: 2022-02-27 | Disposition: A | Discharge status: Home / Self Care | Payer: HMO | Source: Ambulatory Visit | Attending: Adult Health | Admitting: Adult Health

## 2022-02-27 DIAGNOSIS — M48061 Spinal stenosis, lumbar region without neurogenic claudication: Secondary | ICD-10-CM | POA: Diagnosis not present

## 2022-02-27 DIAGNOSIS — M545 Low back pain, unspecified: Secondary | ICD-10-CM

## 2022-02-28 ENCOUNTER — Other Ambulatory Visit: Payer: HMO

## 2022-03-02 ENCOUNTER — Other Ambulatory Visit: Payer: Self-pay | Admitting: Adult Health

## 2022-03-02 DIAGNOSIS — F32A Depression, unspecified: Secondary | ICD-10-CM | POA: Diagnosis not present

## 2022-03-02 DIAGNOSIS — E1159 Type 2 diabetes mellitus with other circulatory complications: Secondary | ICD-10-CM | POA: Diagnosis not present

## 2022-03-02 DIAGNOSIS — I10 Essential (primary) hypertension: Secondary | ICD-10-CM

## 2022-03-02 DIAGNOSIS — Z794 Long term (current) use of insulin: Secondary | ICD-10-CM | POA: Diagnosis not present

## 2022-03-02 DIAGNOSIS — E785 Hyperlipidemia, unspecified: Secondary | ICD-10-CM

## 2022-03-02 DIAGNOSIS — M545 Low back pain, unspecified: Secondary | ICD-10-CM

## 2022-03-05 DIAGNOSIS — N184 Chronic kidney disease, stage 4 (severe): Secondary | ICD-10-CM | POA: Diagnosis not present

## 2022-03-07 DIAGNOSIS — N184 Chronic kidney disease, stage 4 (severe): Secondary | ICD-10-CM | POA: Diagnosis not present

## 2022-03-09 ENCOUNTER — Telehealth: Payer: Self-pay | Admitting: Adult Health

## 2022-03-09 NOTE — Telephone Encounter (Signed)
Patient called because he would like an update on his walker orders to see when it is coming. Patient states it has been about three weeks.   Good callback number is [phone]       Please advise

## 2022-03-12 ENCOUNTER — Telehealth: Payer: Self-pay | Admitting: Adult Health

## 2022-03-12 NOTE — Telephone Encounter (Signed)
[facility] / [employer] [phone]  Called on 02/20/22 because Pt wanted a regular walker.   But,  walker is on back order  Pt had a fall yesterday, is still using cane, but needs Rollator.  Can we change Rx to Rollator?

## 2022-03-12 NOTE — Telephone Encounter (Signed)
Pt is calling regarding referral cortisone shots in his back for pain relief. States these shots were discussed at a previous appointment.

## 2022-03-13 NOTE — Telephone Encounter (Signed)
Spoke to [employer] and she stated pt is now requesting a Corporate investment banker. Pt does not care which company this goes to. Reached out to [employer] and they stated that the regular walker and the Rolator was on back order. Nou stated that she has someone trying to help the pt sign up to rent a walker until he is able to receive one. Tried to advised pt of update but he is confused about the update. Pt claimed he never canceled any orders and want it switched. Pt was also confused as to why he could not get this at our office. Explained the situation to pt but still not understaning. Pt stated he do not care where the order go he just wants a Rolator. Advised pt that I will call him when I receive an update.

## 2022-03-13 NOTE — Telephone Encounter (Signed)
Please advise 

## 2022-03-13 NOTE — Telephone Encounter (Signed)
Noted  

## 2022-03-14 DIAGNOSIS — E113512 Type 2 diabetes mellitus with proliferative diabetic retinopathy with macular edema, left eye: Secondary | ICD-10-CM | POA: Diagnosis not present

## 2022-03-14 DIAGNOSIS — H43822 Vitreomacular adhesion, left eye: Secondary | ICD-10-CM | POA: Diagnosis not present

## 2022-03-14 DIAGNOSIS — H3582 Retinal ischemia: Secondary | ICD-10-CM | POA: Diagnosis not present

## 2022-03-14 NOTE — Telephone Encounter (Signed)
Russell Austin, [place]; [name], Mike Craze Let me take a look :)   Previous Messages  ----- Message -----  From: Franco Collet, CMA  Sent: 03/13/2022   9:53 AM EDT  To: Darlina Guys; Miquel Dunn; Stephannie Peters; *  Subject: rolator                                         Hello,   I reached out regarding a pt walker and received a note from somewhere stating that the pt wanted it to go to anther place. However, the requested place did not have the walker in stock. Pt is now requesting a Rolator which is not in stock either. Pt does not have a preference which company it goes to since having multiple falls in the past 3 weeks. Will you all be able to help?

## 2022-03-15 DIAGNOSIS — R269 Unspecified abnormalities of gait and mobility: Secondary | ICD-10-CM | POA: Diagnosis not present

## 2022-03-15 DIAGNOSIS — M6281 Muscle weakness (generalized): Secondary | ICD-10-CM | POA: Diagnosis not present

## 2022-03-16 DIAGNOSIS — E1122 Type 2 diabetes mellitus with diabetic chronic kidney disease: Secondary | ICD-10-CM | POA: Diagnosis not present

## 2022-03-16 DIAGNOSIS — I129 Hypertensive chronic kidney disease with stage 1 through stage 4 chronic kidney disease, or unspecified chronic kidney disease: Secondary | ICD-10-CM | POA: Diagnosis not present

## 2022-03-16 DIAGNOSIS — N2581 Secondary hyperparathyroidism of renal origin: Secondary | ICD-10-CM | POA: Diagnosis not present

## 2022-03-16 DIAGNOSIS — N184 Chronic kidney disease, stage 4 (severe): Secondary | ICD-10-CM | POA: Diagnosis not present

## 2022-03-19 ENCOUNTER — Other Ambulatory Visit: Payer: Self-pay | Admitting: Adult Health

## 2022-03-19 DIAGNOSIS — E1140 Type 2 diabetes mellitus with diabetic neuropathy, unspecified: Secondary | ICD-10-CM

## 2022-03-20 ENCOUNTER — Ambulatory Visit: Payer: HMO | Admitting: Adult Health

## 2022-03-21 ENCOUNTER — Telehealth: Payer: Self-pay | Admitting: Pharmacist

## 2022-03-21 ENCOUNTER — Other Ambulatory Visit: Payer: Self-pay | Admitting: Adult Health

## 2022-03-21 DIAGNOSIS — E1140 Type 2 diabetes mellitus with diabetic neuropathy, unspecified: Secondary | ICD-10-CM

## 2022-03-21 DIAGNOSIS — M47816 Spondylosis without myelopathy or radiculopathy, lumbar region: Secondary | ICD-10-CM | POA: Diagnosis not present

## 2022-03-21 DIAGNOSIS — Z6825 Body mass index (BMI) 25.0-25.9, adult: Secondary | ICD-10-CM | POA: Diagnosis not present

## 2022-03-21 DIAGNOSIS — I10 Essential (primary) hypertension: Secondary | ICD-10-CM

## 2022-03-21 NOTE — Telephone Encounter (Signed)
Russell Austin, Russell Austin; Russell Austin I've shipped it, he should be getting it soon.   Previous Messages  ----- Message -----  From: Russell Austin, CMA  Sent: 03/20/2022   2:33 PM EDT  To: Russell Austin  Subject: Rolator                                         Hello,   Have you heard anything regarding this order? Please advise.

## 2022-03-21 NOTE — Telephone Encounter (Signed)
Tried to call pt to update but no answer. Will try again shortly.

## 2022-03-21 NOTE — Telephone Encounter (Signed)
Left message for return call.

## 2022-03-21 NOTE — Telephone Encounter (Signed)
Left message to return phone call.

## 2022-03-21 NOTE — Chronic Care Management (AMB) (Addendum)
Chronic Care Management Pharmacy Assistant   Name: Russell Austin  MRN: [id number] DOB: 01/16/1951  Reason for Encounter: Medication Review Medication Coordination    Recent office visits:  None   Recent consult visits:  None   Hospital visits:  None in previous 6 months  Medications: Outpatient Encounter Medications as of 03/21/2022  Medication Sig   amLODipine (NORVASC) 10 MG tablet Take 1 tablet (10 mg total) by mouth daily.   aspirin EC 81 MG tablet Take 81 mg by mouth daily.   atorvastatin (LIPITOR) 10 MG tablet Take 1 tablet (10 mg total) by mouth daily.   budesonide-formoterol (SYMBICORT) 160-4.5 MCG/ACT inhaler Inhale 2 puffs into the lungs in the morning and at bedtime.   buPROPion (WELLBUTRIN SR) 150 MG 12 hr tablet TAKE ONE TABLET BY MOUTH EVERY MORNING   chlorthalidone (HYGROTON) 25 MG tablet Take 12.5 mg by mouth daily.   Continuous Blood Gluc Receiver (FREESTYLE LIBRE 14 DAY READER) DEVI Use with freestyle libre system   Continuous Blood Gluc Sensor (FREESTYLE LIBRE 3 SENSOR) MISC 1 Device by Does not apply route every 14 (fourteen) days. Place 1 sensor on the skin every 14 days. Use to check glucose continuously   gabapentin (NEURONTIN) 300 MG capsule TAKE ONE CAPSULE BY MOUTH EVERYDAY AT BEDTIME   glipiZIDE (GLUCOTROL XL) 2.5 MG 24 hr tablet TAKE ONE TABLET BY MOUTH EVERY MORNING and TAKE ONE TABLET BY MOUTH EVERY EVENING   Insulin Pen Needle (B-D ULTRAFINE III SHORT PEN) 31G X 8 MM MISC USE WITH LANUTS PEN   LANTUS SOLOSTAR 100 UNIT/ML Solostar Pen inject 10 units into THE SKIN daily   losartan (COZAAR) 100 MG tablet Take 1 tablet (100 mg total) by mouth every morning.   metoprolol tartrate (LOPRESSOR) 50 MG tablet TAKE ONE TABLET BY MOUTH EVERY MORNING and TAKE ONE TABLET BY MOUTH EVERYDAY AT BEDTIME   tiZANidine (ZANAFLEX) 2 MG tablet TAKE ONE TABLET BY MOUTH EVERYDAY AT BEDTIME   No facility-administered encounter medications on file as of 03/21/2022.    Reviewed chart prior to disease state call. Spoke with patient regarding BP  Recent Office Vitals: BP Readings from Last 3 Encounters:  02/13/22 134/78  01/11/22 140/80  01/08/22 (!) 162/84   Pulse Readings from Last 3 Encounters:  02/13/22 70  01/11/22 66  01/08/22 83    Wt Readings from Last 3 Encounters:  02/13/22 174 lb 9.6 oz (79.2 kg)  01/17/22 185 lb (83.9 kg)  01/08/22 185 lb (83.9 kg)     Kidney Function Lab Results  Component Value Date/Time   CREATININE 3.52 (H) 12/19/2021 07:41 AM   CREATININE 3.82 (H) 12/09/2020 08:04 AM   GFR 16.87 (L) 12/19/2021 07:41 AM   GFRNONAA 41 (L) 10/28/2018 09:12 AM   GFRAA 47 (L) 10/28/2018 09:12 AM       Latest Ref Rng & Units 12/19/2021    7:41 AM 12/09/2020    8:04 AM 11/11/2019    7:44 AM  BMP  Glucose 70 - 99 mg/dL 170  95  196   BUN 6 - 23 mg/dL 44  53  31   Creatinine 0.40 - 1.50 mg/dL 3.52  3.82  2.07   Sodium 135 - 145 mEq/L 140  136  139   Potassium 3.5 - 5.1 mEq/L 4.9  4.2  4.7   Chloride 96 - 112 mEq/L 108  103  107   CO2 19 - 32 mEq/L 25  26  28    Calcium  8.4 - 10.5 mg/dL 9.0  8.6  8.7     Current antihypertensive regimen:  Amlodipine 10mg , 1 tablet once daily - Appropriate, Query effective, Safe, Accessible Losartan 100 mg 1 tablet once daily - Appropriate, Effective, Query Safe, Accessible Metoprolol tartrate 50mg , 1 tablet twice daily - Appropriate, Query effective, Safe, Accessible Chlorthalidone 25 mg 1/2 tablet daily - Appropriate, Query effective, Safe, Accessible How often are you checking your Blood Pressure? 1-2x per week Current home BP readings: 149/72 148/84 138/74  What recent interventions/DTPs have been made by any provider to improve Blood Pressure control since last CPP Visit: Patient reports that the alarms he has set on his phone have been helping him to remember to check his pressures. He denies any hyper/hypotensive symptoms. He reports that his top number has not been over 150 at all for  this month.  Adherence Review: Is the patient currently on ACE/ARB medication? Yes Does the patient have >5 day gap between last estimated fill dates? No   BP Readings from Last 3 Encounters:  02/13/22 134/78  01/11/22 140/80  01/08/22 (!) 162/84    Lab Results  Component Value Date   HGBA1C 7.7 (H) 12/19/2021     Patient obtains medications through Adherence Packaging  90 Days   Last adherence delivery included:  Freestyle Libre Sensors  B-D Ultrafine short Pen needles Tizanidine 2 mg - take one tablet at bedtime Gabapentin 300 mg -  take one capsule at bedtime Glipizide ER 2.5 mg - take one tablet every morning and one tablet every evening Bupropion SR 150 mg - take one tablet every morning Aspirin 81 mg - take one tablet at breakfast Chlorthalidone 25 mg - 1/2 tablet at breakfast  Atorvastatin 10 mg - take one tablet at breakfast Amlodipine 10 mg - take one tablet at breakfast Metoprolol Tartrate 50 mg - take one tablet every morning and one tablet every evening Losartan 100 mg - take one tablet every morning   Lantus filled 12/20/21 90DS PA in process for [facility]    Patient is due for next adherence delivery on: 04/03/22. Called patient and reviewed medications and coordinated delivery. Packs 90 DS  This delivery to include: B-D Ultrafine short Pen needles Freestyle Libre Sensors  Tizanidine 2 mg - take one tablet at bedtime Gabapentin 300 mg -  take one capsule at bedtime Glipizide ER 2.5 mg - take one tablet every morning and one tablet every evening Bupropion SR 150 mg - take one tablet every morning Aspirin 81 mg - take one tablet at breakfast Chlorthalidone 25 mg - 1/2 tablet at breakfast  Atorvastatin 10 mg - take one tablet at breakfast Amlodipine 5 mg - take one tablet at breakfast Metoprolol Tartrate 50 mg - take one tablet every morning and one tablet every evening Losartan 100 mg - take one tablet every morning   Lantus delivered on 03/21/22 pt  confirms receipt and is on auto fill for next month he is aware    Confirmed delivery date of 04/03/22, advised patient that pharmacy will contact them the morning of delivery.   Care Gaps: BP- 138/74 03/22/22 AWV- 5/23  Lab Results  Component Value Date   HGBA1C 7.7 (H) 12/19/2021    Star Rating Drugs: Losartan 100 mg - Last filled 12/29/21 90 DS at [facility] Atorvastatin 10 mg - Last filled 12/28/21 90 DS at [facility] Glipizide 2.5 mg - Last filled 12/28/21 90 DS at [facility] Pharmacist Assistant [phone]

## 2022-03-21 NOTE — Telephone Encounter (Signed)
Pt is returning kendra call 

## 2022-03-23 ENCOUNTER — Ambulatory Visit (INDEPENDENT_AMBULATORY_CARE_PROVIDER_SITE_OTHER): Payer: HMO

## 2022-03-23 DIAGNOSIS — E1140 Type 2 diabetes mellitus with diabetic neuropathy, unspecified: Secondary | ICD-10-CM

## 2022-03-23 DIAGNOSIS — Z8673 Personal history of transient ischemic attack (TIA), and cerebral infarction without residual deficits: Secondary | ICD-10-CM

## 2022-03-23 DIAGNOSIS — I10 Essential (primary) hypertension: Secondary | ICD-10-CM

## 2022-03-23 NOTE — Telephone Encounter (Signed)
Spoke to pt and stated that he received his walker last week. No further action needed!

## 2022-03-23 NOTE — Chronic Care Management (AMB) (Signed)
Chronic Care Management   CCM RN Visit Note  03/23/2022 Name: Russell Austin MRN: [id number] DOB: 05-24-1951  Subjective: Russell Austin is a 71 y.o. year old male who is a primary care patient of [name], NP. The care management team was consulted for assistance with disease management and care coordination needs.    Engaged with patient by telephone for follow up visit in response to provider referral for case management and/or care coordination services.   Consent to Services:  The patient was given information about Chronic Care Management services, agreed to services, and gave verbal consent prior to initiation of services.  Please see initial visit note for detailed documentation.   Patient agreed to services and verbal consent obtained.   Assessment: Review of patient past medical history, allergies, medications, health status, including review of consultants reports, laboratory and other test data, was performed as part of comprehensive evaluation and provision of chronic care management services.   SDOH (Social Determinants of Health) assessments and interventions performed:    CCM Care Plan  Allergies  Allergen Reactions   Lactose Intolerance (Gi) Diarrhea   Apple Pectin [Pectin] Itching    ITCHY THROAT   Metformin And Related     D/t decreased kidney function    Peach [Prunus Persica] Itching    ITCHY THROAT   Morphine And Related Anxiety    Jittery    Outpatient Encounter Medications as of 03/23/2022  Medication Sig   amLODipine (NORVASC) 10 MG tablet Take 1 tablet (10 mg total) by mouth daily.   aspirin EC 81 MG tablet Take 81 mg by mouth daily.   atorvastatin (LIPITOR) 10 MG tablet Take 1 tablet (10 mg total) by mouth daily.   budesonide-formoterol (SYMBICORT) 160-4.5 MCG/ACT inhaler Inhale 2 puffs into the lungs in the morning and at bedtime.   buPROPion (WELLBUTRIN SR) 150 MG 12 hr tablet TAKE ONE TABLET BY MOUTH EVERY MORNING    chlorthalidone (HYGROTON) 25 MG tablet Take 12.5 mg by mouth daily.   Continuous Blood Gluc Receiver (FREESTYLE LIBRE 14 DAY READER) DEVI Use with freestyle libre system   Continuous Blood Gluc Sensor (FREESTYLE LIBRE 3 SENSOR) MISC 1 Device by Does not apply route every 14 (fourteen) days. Place 1 sensor on the skin every 14 days. Use to check glucose continuously   gabapentin (NEURONTIN) 300 MG capsule TAKE ONE CAPSULE BY MOUTH EVERYDAY AT BEDTIME   glipiZIDE (GLUCOTROL XL) 2.5 MG 24 hr tablet TAKE ONE TABLET BY MOUTH EVERY MORNING and TAKE ONE TABLET BY MOUTH EVERY EVENING   Insulin Pen Needle (B-D ULTRAFINE III SHORT PEN) 31G X 8 MM MISC USE WITH LANUTS PEN   LANTUS SOLOSTAR 100 UNIT/ML Solostar Pen inject 10 units into THE SKIN daily   losartan (COZAAR) 100 MG tablet Take 1 tablet (100 mg total) by mouth every morning.   metoprolol tartrate (LOPRESSOR) 50 MG tablet TAKE ONE TABLET BY MOUTH EVERY MORNING and TAKE ONE TABLET BY MOUTH EVERYDAY AT BEDTIME   tiZANidine (ZANAFLEX) 2 MG tablet TAKE ONE TABLET BY MOUTH EVERYDAY AT BEDTIME   No facility-administered encounter medications on file as of 03/23/2022.    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)    CKD stage 2 due to type 2 diabetes mellitus ([place]) 02/12/2015   Right hemiparesis ([place])    CKD (chronic kidney disease) stage 5, GFR less than 15 ml/min ([place]) 02/10/2015   Type 2 diabetes mellitus with diabetic neuropathy ([place]) 02/10/2015   Neck pain     Numbness and tingling 02/09/2015   Left shoulder pain 07/17/2012   Plantar fasciitis, bilateral 07/17/2012   Peripheral neuropathy 11/06/2007   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   Essential hypertension 07/08/2007   EXTRINSIC ASTHMA, UNSPECIFIED 06/25/2007    Conditions to be addressed/monitored:HTN, HLD, and DMII  Care Plan : RN Care Manager Plan of Care  Updates made by [name], RN since 03/23/2022 12:00 AM  Completed 03/23/2022   Problem: Chronic Disease Management and Care Coordination Needs (DM2,HTN and HLD Resolved 03/23/2022  Priority: High     Long-Range Goal: Establish Plan of Care for Chronic Disease Management Needs (DM2,HTN and HLD) Completed 03/23/2022  Start Date: 07/17/2021  Expected End Date: 08/15/2022  Recent Progress: On track  Priority: High  Note:   RNCM case closed goals met  Current Barriers:  Knowledge Deficits related to plan of care for management of HTN, HLD, and DMII Chronic Disease Management support and education needs related to HTN, HLD, and DMII States he now has a rollator and he feels much more steady.  Denies any falls since he got his walker.  States he is  taking his Lantus insulin every day. States he is scanning his [employer] once a day ranges from 70-155.   States his wife is preparing healthy meals and he is eating a snack at bedtime to avoid having a low during the night. States he has been walking about 3 days a week for about 20 minutes weather permitting States his B/Ps have been good when he goes to the doctor and he has been checking it at home about weekly.  States his last reading at home was 138/73. States he saw the back doctor and he is to have a shot in his back next week. States his chronic pain in his hand is unchanged.   RNCM Clinical Goal(s):  Patient will verbalize understanding of plan for management of HTN, HLD, and DMII as evidenced by voiced adherence to plan of care verbalize basic understanding of  HTN, HLD,  and DMII disease process and self health management plan as evidenced by voiced understanding and teach back take all medications exactly as prescribed and will call provider for medication related questions as evidenced by dispense report and pt verbalization  attend all scheduled medical appointments:  Annual Wellness Visit 01/21/23, Podiatry 04/09/22 as evidenced by medical records demonstrate Improved adherence to prescribed treatment plan for HTN, HLD, and DMII as evidenced by readings within limits, adherence to plan of care continue to work with RN Care Manager to address care management and care coordination needs related to  HTN, HLD, and DMII as evidenced by adherence to CM Team Scheduled appointments through collaboration with RN Care manager, provider, and care team.   Interventions: 1:1 collaboration with primary care provider regarding development and update of comprehensive plan of care as evidenced by provider attestation and co-signature Inter-disciplinary care team collaboration (see longitudinal plan of care) Evaluation of current treatment plan related to  self management and patient's adherence to plan as  established by provider    Falls Interventions:  (Status:  Goal Met.) Long Term Goal Provided written and verbal education re: potential causes of falls and Fall prevention strategies Advised patient of importance of notifying provider of falls Assessed for signs and symptoms of orthostatic hypotension Advised patient to discuss need for walker with provider  Reviewed to use his walker at all times. Reinforced to get up slowly    Hyperlipidemia Interventions:  (Status:  Goal Met.) Long Term Goal Medication review performed; medication list updated in electronic medical record.  Provider established cholesterol goals reviewed Counseled on importance of regular laboratory monitoring as prescribed Reviewed role and benefits of statin for ASCVD risk reduction Discussed  strategies to manage statin-induced myalgias Reviewed importance of limiting foods high in cholesterol Reviewed exercise goals and target of 150 minutes per week  Hypertension Interventions:  (Status:  Goal Met.) Long Term Goal Last practice recorded BP readings:  BP Readings from Last 3 Encounters:  02/13/22 134/78  01/11/22 140/80  01/08/22 (!) 162/84  Most recent eGFR/CrCl: No results found for: EGFR  No components found for: CRCL  Evaluation of current treatment plan related to hypertension self management and patient's adherence to plan as established by provider Provided education to patient re: stroke prevention, s/s of heart attack and stroke Reviewed medications with patient and discussed importance of compliance Discussed plans with patient for ongoing care management follow up and provided patient with direct contact information for care management team Advised patient, providing education and rationale, to monitor blood pressure daily and record, calling PCP for findings outside established parameters Provided education on prescribed diet low sodium low CHO Discussed complications of poorly controlled blood pressure such as heart disease, stroke, circulatory complications, vision complications, kidney impairment, sexual dysfunction Reinforced to check his B/P at least weekly. Reinforced to try taking his B/P after he has taken his B/P medications and to contact his provider if elevated.      Diabetes Interventions:(Status:  Goal Met.)  Long Term Goal Assessed patient's understanding of A1c goal: <7% Provided education to patient about basic DM disease process Reviewed medications with patient and discussed importance of medication adherence Counseled on importance of regular laboratory monitoring as prescribed Reviewed scheduled/upcoming provider appointments including: CCM Pharm D 02/23/22, [name] NP 03/20/22, Annual Wellness Visit 01/21/23, Podiatry 04/09/22  Advised  patient, providing education and rationale, to check cbg scan 3 times a day and record, calling provider for findings outside established parameters  Reinforced to take his Lantus insulin daily around the same time. Reinforced goals for time in range and how that effects his A1C value,  Reinforced s/sx of hypoglycemia and how to treat and importance to having bedtime snack Lab Results  Component Value Date   HGBA1C 7.7 (H) 12/19/2021  Pain Interventions:  (Status:  Goal Met.) Long Term Goal Pain assessment performed Medications reviewed Reviewed provider established plan for pain management Discussed importance of adherence to all scheduled medical appointments Counseled on the importance of reporting any/all new or changed pain symptoms or management strategies to pain management provider Discussed use of relaxation techniques and/or diversional activities to assist with pain reduction (distraction, imagery, relaxation, massage, acupressure, TENS, heat, and cold application Reviewed with patient prescribed pharmacological and nonpharmacological pain relief strategies  Reviewed to keep appointment for back injections Reinforced to try to change position at night when sleeping and to try to use pillows to help him stay in position    Patient Goals/Self-Care Activities: Take all medications as  prescribed Attend all scheduled provider appointments Call pharmacy for medication refills 3-7 days in advance of running out of medications Call provider office for new concerns or questions  keep appointment with eye doctor check blood sugar at prescribed times: three times daily and when you have symptoms of low or high blood sugar check feet daily for cuts, sores or redness trim toenails straight across drink 6 to 8 glasses of water each day fill half of plate with vegetables limit fast food meals to no more than 1 per week manage portion size switch to sugar-free drinks keep feet up while  sitting check blood pressure daily choose a place to take my blood pressure (home, clinic or office, retail store) call doctor for signs and symptoms of high blood pressure keep all doctor appointments take medications for blood pressure exactly as prescribed eat more whole grains, fruits and vegetables, lean meats and healthy fats limit salt intake to 239m/day call for medicine refill 2 or 3 days before it runs out take all medications exactly as prescribed call doctor with any symptoms you believe are related to your medicine Follow Up Plan:  The patient has been provided with contact information for the care management team and has been advised to call with any health related questions or concerns.  No further follow up required: RNCM case closed goals met        Plan:The patient has been provided with contact information for the care management team and has been advised to call with any health related questions or concerns.  No further follow up required: RNCM case closed goals met  MPeter GarterRN, BGlen [facility] CDE Care Management Coordinator [facility] ([phone]

## 2022-03-23 NOTE — Progress Notes (Signed)
Call to patient per [facility], they have received a dose decrease on patients Amlodipine to 5 mg, asked patient if he would like to receive a partial fill of this medication until his delivery on the 1st or cut in half the pills he already has, he reports he would like a partial, pharmacy advised, to be sent to patient on today he is aware.    [place] Clinical Pharmacist Assistant [phone]

## 2022-03-23 NOTE — Patient Instructions (Signed)
Visit Information RNCM case closed goals met Thank you for allowing me to share the care management and care coordination services that are available to you as part of your health plan and services through your primary care provider and medical home. Please reach out to me at [phone] if the care management/care coordination team may be of assistance to you in the future.   Cora Stetson RN, BSN,CCM, CDE Care Management Coordinator [facility] [phone]   

## 2022-04-02 DIAGNOSIS — I10 Essential (primary) hypertension: Secondary | ICD-10-CM

## 2022-04-02 DIAGNOSIS — E1140 Type 2 diabetes mellitus with diabetic neuropathy, unspecified: Secondary | ICD-10-CM

## 2022-04-02 DIAGNOSIS — Z794 Long term (current) use of insulin: Secondary | ICD-10-CM | POA: Diagnosis not present

## 2022-04-09 ENCOUNTER — Ambulatory Visit: Payer: HMO | Admitting: Podiatry

## 2022-04-09 DIAGNOSIS — Z794 Long term (current) use of insulin: Secondary | ICD-10-CM

## 2022-04-09 DIAGNOSIS — B351 Tinea unguium: Secondary | ICD-10-CM | POA: Diagnosis not present

## 2022-04-09 DIAGNOSIS — E1140 Type 2 diabetes mellitus with diabetic neuropathy, unspecified: Secondary | ICD-10-CM | POA: Diagnosis not present

## 2022-04-09 DIAGNOSIS — M79675 Pain in left toe(s): Secondary | ICD-10-CM | POA: Diagnosis not present

## 2022-04-09 DIAGNOSIS — M79674 Pain in right toe(s): Secondary | ICD-10-CM | POA: Diagnosis not present

## 2022-04-09 DIAGNOSIS — R600 Localized edema: Secondary | ICD-10-CM

## 2022-04-09 NOTE — Patient Instructions (Signed)
Edema  Edema is an abnormal buildup of fluids in the body tissues and under the skin. Swelling of the legs, feet, and ankles is a common symptom that becomes more likely as you get older. Swelling is also common in looser tissues, such as around the eyes. Pressing on the area may make a temporary dent in your skin (pitting edema). This fluid may also accumulate in your lungs (pulmonary edema). There are many possible causes of edema. Eating too much salt (sodium) and being on your feet or sitting for a long time can cause edema in your legs, feet, and ankles. Common causes of edema include: Certain medical conditions, such as heart failure, liver or kidney disease, and cancer. Weak leg blood vessels. An injury. Pregnancy. Medicines. Being obese. Low protein levels in the blood. Hot weather may make edema worse. Edema is usually painless. Your skin may look swollen or shiny. Follow these instructions at home: Medicines Take over-the-counter and prescription medicines only as told by your health care provider. Your health care provider may prescribe a medicine to help your body get rid of extra water (diuretic). Take this medicine if you are told to take it. Eating and drinking Eat a low-salt (low-sodium) diet to reduce fluid as told by your health care provider. Sometimes, eating less salt may reduce swelling. Depending on the cause of your swelling, you may need to limit how much fluid you drink (fluid restriction). General instructions Raise (elevate) the injured area above the level of your heart while you are sitting or lying down. Do not sit still or stand for long periods of time. Do not wear tight clothing. Do not wear garters on your upper legs. Exercise your legs to get your circulation going. This helps to move the fluid back into your blood vessels, and it may help the swelling go down. Wear compression stockings as told by your health care provider. These stockings help to prevent  blood clots and reduce swelling in your legs. It is important that these are the correct size. These stockings should be prescribed by your health care provider to prevent possible injuries. If elastic bandages or wraps are recommended, use them as told by your health care provider. Contact a health care provider if: Your edema does not get better with treatment. You have heart, liver, or kidney disease and have symptoms of edema. You have sudden and unexplained weight gain. Get help right away if: You develop shortness of breath or chest pain. You cannot breathe when you lie down. You develop pain, redness, or warmth in the swollen areas. You have heart, liver, or kidney disease and suddenly get edema. You have a fever and your symptoms suddenly get worse. These symptoms may be an emergency. Get help right away. Call 911. Do not wait to see if the symptoms will go away. Do not drive yourself to the hospital. Summary Edema is an abnormal buildup of fluids in the body tissues and under the skin. Eating too much salt (sodium)and being on your feet or sitting for a long time can cause edema in your legs, feet, and ankles. Raise (elevate) the injured area above the level of your heart while you are sitting or lying down. Follow your health care provider's instructions about diet and how much fluid you can drink. This information is not intended to replace advice given to you by your health care provider. Make sure you discuss any questions you have with your health care provider. Document Revised: 04/24/2021 Document   Reviewed: 04/24/2021 Elsevier Patient Education  2023 Elsevier Inc.  

## 2022-04-12 ENCOUNTER — Ambulatory Visit (INDEPENDENT_AMBULATORY_CARE_PROVIDER_SITE_OTHER): Payer: HMO | Admitting: Adult Health

## 2022-04-12 ENCOUNTER — Encounter: Payer: Self-pay | Admitting: Adult Health

## 2022-04-12 VITALS — BP 150/80 | HR 69 | Temp 97.9°F | Ht 69.0 in | Wt 175.0 lb

## 2022-04-12 DIAGNOSIS — R600 Localized edema: Secondary | ICD-10-CM

## 2022-04-12 DIAGNOSIS — F339 Major depressive disorder, recurrent, unspecified: Secondary | ICD-10-CM | POA: Diagnosis not present

## 2022-04-12 MED ORDER — BUPROPION HCL ER (XL) 300 MG PO TB24: 300.0000 mg | ORAL_TABLET | Freq: Every day | ORAL | 1 refills | Status: DC

## 2022-04-12 NOTE — Progress Notes (Signed)
Subjective:    Patient ID: Russell Austin, male    DOB: 20-Apr-1951, 71 y.o.   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  He presents to the office today for lower extremity edema and increased depression.   He reports that over the last week he has had increased swelling in his lower extremities, worse in the right leg. He was seen by his nephrologist and reports that yesterday they sent in lasix but he has not received it yet from the pharmacy   His wife, who is with him today reports worsening depression. This is likely coming from low back pain ( he is being seen by Neurosurgery in a few weeks for an epidural injection), he does not leave the house much and lives a sedentary lifestyle. 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   Diabetes mellitus type II    Diabetic retinopathy FOLLOWED BY DR Zadie Rhine   ED (erectile dysfunction)    Glaucoma of both eyes    Hypertension    Left hydrocele    Stroke [facility])     Social History   Socioeconomic History   Marital status: Married    Spouse name: Not on file   Number of children: 1   Years of education: 12   Highest education level: Not on file  Occupational History   Occupation: Disabled  Tobacco Use   Smoking status: Never   Smokeless tobacco: Never  Substance and Sexual Activity   Alcohol use: No   Drug use: No   Sexual activity: Not on file  Other Topics Concern   Not on file  Social History Narrative   Lives at home with his wife and granddaughter.   Left-handed.   3 cups caffeine per day.   Social Determinants of Health    Financial Resource Strain: Low Risk  (01/17/2022)   Overall Financial Resource Strain (CARDIA)    Difficulty of Paying Living Expenses: Not hard at all  Food Insecurity: No Food Insecurity (01/17/2022)   Hunger Vital Sign    Worried About Running Out of Food in the Last Year: Never true    Ran Out of Food in the Last Year: Never true  Transportation Needs: No Transportation Needs (01/17/2022)   PRAPARE - Hydrologist (Medical): No    Lack of Transportation (Non-Medical): No  Physical Activity: Inactive (01/17/2022)   Exercise Vital Sign    Days of Exercise per Week: 0 days    Minutes of Exercise per Session: 0 min  Stress: No Stress Concern Present (01/17/2022)   [place]    Feeling of Stress : Not at all  Social Connections: [place] (01/17/2022)   Social Connection and Isolation Panel [NHANES]    Frequency of Communication with Friends and Family: More than three times a week    Frequency of Social Gatherings with Friends and Family: More than three times a week    Attends Religious Services: More than 4 times per year    Active Member of [employer] or Organizations: Yes  Attends Archivist Meetings: More than 4 times per year    Marital Status: Married  Human resources officer Violence: Not At Risk (01/17/2022)   Humiliation, Afraid, Rape, and Kick questionnaire    Fear of Current or Ex-Partner: No    Emotionally Abused: No    Physically Abused: No    Sexually Abused: No    Past Surgical History:  Procedure Laterality Date   APPENDECTOMY  AGE EARLY 20'S   COLONOSCOPY  12/30/2020   every 5 years   HYDROCELE EXCISION  03/31/2012   Procedure: HYDROCELECTOMY ADULT;  Surgeon: Franchot Gallo, MD;  Location: [facility];  Service: Urology;  Laterality: Left;  23 MINS     LEFT EYE LASER RETINA REPAIR  SEPT 2012   RIGHT EYE VITRECTOMY/ INSERTION GLAUCOMA  SETON/ LASER REPAIR  12-13-2008   RETINAL ARTERY OCCLUSION /NEOVASCULAR GLAUCOMA/ HEMORRHAGE   RIGHT EYE VITRETOMY/ INSERTION GLAUCOMA SETON X2/ LASER  03-24-2009   RECURRENT HEMORRHAGE/ OCCLUSION INTERNAL SETON   SHOULDER ARTHROSCOPY Right 2005   undescended right testicle removed  1994    Family History  Problem Relation Age of Onset   Glaucoma Mother    Diabetes Mother    Stomach cancer Maternal Grandfather    Stroke Maternal Grandmother     Allergies  Allergen Reactions   Lactose Intolerance (Gi) Diarrhea   Apple Pectin [Pectin] Itching    ITCHY THROAT   Metformin And Related     D/t decreased kidney function    Peach [Prunus Persica] Itching    ITCHY THROAT   Morphine And Related Anxiety    Jittery    Current Outpatient Medications on File Prior to Visit  Medication Sig Dispense Refill   amLODipine (NORVASC) 10 MG tablet Take 1 tablet (10 mg total) by mouth daily. 90 tablet 2   aspirin EC 81 MG tablet Take 81 mg by mouth daily.     atorvastatin (LIPITOR) 10 MG tablet Take 1 tablet (10 mg total) by mouth daily. 90 tablet 3   budesonide-formoterol (SYMBICORT) 160-4.5 MCG/ACT inhaler Inhale 2 puffs into the lungs in the morning and at bedtime. 3 each 3   buPROPion (WELLBUTRIN SR) 150 MG 12 hr tablet TAKE ONE TABLET BY MOUTH EVERY MORNING 90 tablet 0   chlorthalidone (HYGROTON) 25 MG tablet Take 12.5 mg by mouth daily.     Continuous Blood Gluc Receiver (FREESTYLE LIBRE 14 DAY READER) DEVI Use with freestyle libre system 1 each 0   Continuous Blood Gluc Sensor (FREESTYLE LIBRE 3 SENSOR) MISC 1 Device by Does not apply route every 14 (fourteen) days. Place 1 sensor on the skin every 14 days. Use to check glucose continuously 6 each 3   gabapentin (NEURONTIN) 300 MG capsule TAKE ONE CAPSULE BY MOUTH EVERYDAY AT BEDTIME 90 capsule 1   glipiZIDE (GLUCOTROL XL) 2.5 MG 24 hr tablet TAKE ONE TABLET BY MOUTH EVERY MORNING and TAKE ONE TABLET BY MOUTH EVERY EVENING 180 tablet 0    Insulin Pen Needle (B-D ULTRAFINE III SHORT PEN) 31G X 8 MM MISC USE WITH LANUTS PEN 100 each 3   LANTUS SOLOSTAR 100 UNIT/ML Solostar Pen inject 10 units into THE SKIN daily 9 mL 0   losartan (COZAAR) 100 MG tablet Take 1 tablet (100 mg total) by mouth every morning. 90 tablet 1   metoprolol tartrate (LOPRESSOR) 50 MG tablet TAKE ONE TABLET BY MOUTH EVERY MORNING and TAKE ONE TABLET BY MOUTH EVERYDAY AT BEDTIME 180 tablet 3   tiZANidine (ZANAFLEX)  2 MG tablet TAKE ONE TABLET BY MOUTH EVERYDAY AT BEDTIME 90 tablet 1   No current facility-administered medications on file prior to visit.    BP (!) 150/80   Pulse 69   Temp 97.9 F (36.6 C) (Oral)   Ht 5\' 9"  (1.753 m)   Wt 175 lb (79.4 kg)   SpO2 99%   BMI 25.84 kg/m       Objective:   Physical Exam Vitals and nursing note reviewed.  Constitutional:      Appearance: Normal appearance.  Cardiovascular:     Rate and Rhythm: Normal rate and regular rhythm.     Pulses: Normal pulses.     Heart sounds: Normal heart sounds.  Pulmonary:     Effort: Pulmonary effort is normal.     Breath sounds: Normal breath sounds.  Musculoskeletal:     Right lower leg: 3+ Pitting Edema present.     Left lower leg: 2+ Pitting Edema present.  Skin:    General: Skin is warm and dry.  Neurological:     General: No focal deficit present.     Mental Status: He is alert and oriented to person, place, and time.  Psychiatric:        Mood and Affect: Mood normal.        Behavior: Behavior normal.        Thought Content: Thought content normal.        Judgment: Judgment normal.       Assessment & Plan:  1. Lower extremity edema - Take lasix when he gets it from the pharmacy  - Elevate legs at rest  2. Depression, recurrent ([place]) - Will increase wellbutrin to 300 mg XR [place] Office Visit from 04/12/2022 in [place] [facility] [place]  PHQ-9 Total Score 16      - buPROPion (WELLBUTRIN XL) 300 MG 24 hr tablet; Take 1 tablet (300 mg  total) by mouth daily.  Dispense: 90 tablet; Refill: 1  [name], NP

## 2022-04-13 ENCOUNTER — Telehealth: Payer: Self-pay | Admitting: Pharmacist

## 2022-04-13 NOTE — Chronic Care Management (AMB) (Cosign Needed)
[facility] to advise  new script for torsemide and  updated prescription for Bupropion has been received, patient will need to remove 150 mg tabs of Bupropion from packaging and confirm that he  would like new dose sent for 300 mg to be added to packaging. Patient reports the price for medication of 26.00 for 55 DS is too expensive as he pays 40.00 for 90 DS of all his medications. Escalated to MP to see if there are any other cost effective alternatives.  Per Pharmacy and MP Charge is one time for partial fill Torsemide is free of charge, patient aware, he reports he would not like to fill the bupropion and just send his Torsemide, MP Coco Clinical Pharmacist Assistant [phone]

## 2022-04-15 ENCOUNTER — Encounter: Payer: Self-pay | Admitting: Podiatry

## 2022-04-15 NOTE — Progress Notes (Signed)
  Subjective:  Patient ID: Russell Austin, male    DOB: 03/25/1951,  MRN: [id number]  Russell Austin presents to clinic today for at risk foot care with history of diabetic neuropathy and callus(es) left lower extremity and painful thick toenails that are difficult to trim. Painful toenails interfere with ambulation. Aggravating factors include wearing enclosed shoe gear. Pain is relieved with periodic professional debridement. Painful calluses are aggravated when weightbearing with and without shoegear. Pain is relieved with periodic professional debridement.  Last A1c was 7.7%. Patient did not check blood glucose today.  Patient is accompanied by his wife on today's visit. Wife states he has not been elevating his feet at home.  New problem(s): None.   PCP is Russell Peng, NP , and last visit was  February 13, 2022  Allergies  Allergen Reactions   Lactose Intolerance (Gi) Diarrhea   Apple Pectin [Pectin] Itching    ITCHY THROAT   Metformin And Related     D/t decreased kidney function    Peach [Prunus Persica] Itching    ITCHY THROAT   Morphine And Related Anxiety    Jittery    Review of Systems: Negative except as noted in the HPI.  Objective: No changes noted in today's physical examination. Constitutional Russell Austin is a pleasant 71 y.o. African American male, in NAD. AAO x 3.   Vascular Capillary refill time to digits immediate b/l. Palpable DP pulse(s) b/l LE. Faintly palpable PT pulse(s) b/l LE. Pedal hair present. Lower extremity skin temperature gradient within normal limits. +2 pitting edema noted BLE. No cyanosis or clubbing noted b/l LE.  Neurologic Normal speech. Oriented to person, place, and time. Pt has subjective symptoms of neuropathy. Protective sensation intact 5/5 intact bilaterally with 10g monofilament b/l.  Dermatologic No open wounds b/l LE. No interdigital macerations noted b/l LE. Toenails 1-5 right and 1-4 left elongated, discolored,  dystrophic, thickened, and crumbly with subungual debris and tenderness to dorsal palpation. Anonychia noted L 5th toe. Nailbed(s) epithelialized.  No hyperkeratotic nor porokeratotic lesions present on today's visit.  Orthopedic: Muscle strength 5/5 to all lower extremity muscle groups bilaterally. HAV with bunion deformity noted b/l LE. Pes planus deformity noted bilateral LE. LLD with short RLE.   Radiographs: None    Latest Ref Rng & Units 12/19/2021    7:41 AM 09/08/2021    7:12 AM 06/08/2021    7:14 AM  Hemoglobin A1C  Hemoglobin-A1c 4.6 - 6.5 % 7.7  7.1  6.4    Assessment/Plan: 1. Pain due to onychomycosis of toenails of both feet   2. Bilateral lower extremity edema   3. Type 2 diabetes mellitus with diabetic neuropathy, with long-term current use of insulin (HCC)      -Examined patient. -For edema, enouraged patient to wear his compression stockings and elevate feet when at rest. Advised him to donn stockings first thing in the morning; remove at dinnertime. -Mycotic toenails 1-4 bilaterally and R 5th toe were debrided in length and girth with sterile nail nippers and dremel without iatrogenic bleeding. -Patient/POA to call should there be question/concern in the interim.   Return in about 3 months (around 07/10/2022).  Russell Austin, DPM

## 2022-04-19 DIAGNOSIS — N184 Chronic kidney disease, stage 4 (severe): Secondary | ICD-10-CM | POA: Diagnosis not present

## 2022-04-24 DIAGNOSIS — M47816 Spondylosis without myelopathy or radiculopathy, lumbar region: Secondary | ICD-10-CM | POA: Diagnosis not present

## 2022-04-27 DIAGNOSIS — N184 Chronic kidney disease, stage 4 (severe): Secondary | ICD-10-CM | POA: Diagnosis not present

## 2022-04-27 DIAGNOSIS — I129 Hypertensive chronic kidney disease with stage 1 through stage 4 chronic kidney disease, or unspecified chronic kidney disease: Secondary | ICD-10-CM | POA: Diagnosis not present

## 2022-04-27 DIAGNOSIS — N2581 Secondary hyperparathyroidism of renal origin: Secondary | ICD-10-CM | POA: Diagnosis not present

## 2022-04-27 DIAGNOSIS — E1122 Type 2 diabetes mellitus with diabetic chronic kidney disease: Secondary | ICD-10-CM | POA: Diagnosis not present

## 2022-05-28 DIAGNOSIS — N184 Chronic kidney disease, stage 4 (severe): Secondary | ICD-10-CM | POA: Diagnosis not present

## 2022-05-28 DIAGNOSIS — M47816 Spondylosis without myelopathy or radiculopathy, lumbar region: Secondary | ICD-10-CM | POA: Diagnosis not present

## 2022-06-12 ENCOUNTER — Ambulatory Visit (INDEPENDENT_AMBULATORY_CARE_PROVIDER_SITE_OTHER): Payer: HMO

## 2022-06-12 ENCOUNTER — Encounter: Payer: Self-pay | Admitting: Adult Health

## 2022-06-12 ENCOUNTER — Ambulatory Visit (INDEPENDENT_AMBULATORY_CARE_PROVIDER_SITE_OTHER): Payer: HMO | Admitting: Adult Health

## 2022-06-12 VITALS — BP 142/68 | HR 69

## 2022-06-12 DIAGNOSIS — M25521 Pain in right elbow: Secondary | ICD-10-CM

## 2022-06-12 DIAGNOSIS — R0781 Pleurodynia: Secondary | ICD-10-CM

## 2022-06-12 DIAGNOSIS — M954 Acquired deformity of chest and rib: Secondary | ICD-10-CM | POA: Diagnosis not present

## 2022-06-12 NOTE — Progress Notes (Signed)
Subjective:    Patient ID: Russell Austin, male    DOB: 03-10-1951, 71 y.o.   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  He presents to the office today for an acute issue. He reports that he fell at home while folding towels. He lost his balance and fell onto a hard wood floor. He landed on his right side. This incident happened about 5 days ago. He does have improvement in his pain.   Pain in flank is worse with twisting and turning and the in his right elbow is worse with palpation.  He did not experience LOC.    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   Diabetes mellitus type II    Diabetic retinopathy FOLLOWED BY DR Zadie Rhine   ED (erectile dysfunction)    Glaucoma of both eyes    Hypertension    Left hydrocele    Stroke [facility])     Social History   Socioeconomic History   Marital status: Married    Spouse name: Not on file   Number of children: 1   Years of education: 12   Highest education level: 12th grade  Occupational History   Occupation: Disabled  Tobacco Use   Smoking status: Never   Smokeless tobacco: Never  Substance and Sexual Activity   Alcohol use: No   Drug use: No   Sexual activity: Not on file  Other Topics Concern   Not on file  Social History Narrative   Lives at home with his wife and granddaughter.   Left-handed.   3 cups caffeine per day.   Social Determinants of Health   Financial Resource Strain: Low Risk  (06/11/2022)   Overall Financial Resource Strain (CARDIA)    Difficulty of Paying Living Expenses: Not hard at all  Food Insecurity: No Food Insecurity (06/11/2022)   Hunger Vital Sign     Worried About Running Out of Food in the Last Year: Never true    Ran Out of Food in the Last Year: Never true  Transportation Needs: No Transportation Needs (06/11/2022)   PRAPARE - Hydrologist (Medical): No    Lack of Transportation (Non-Medical): No  Physical Activity: Inactive (06/11/2022)   Exercise Vital Sign    Days of Exercise per Week: 0 days    Minutes of Exercise per Session: 0 min  Stress: Stress Concern Present (06/11/2022)   [place]    Feeling of Stress : To some extent  Social Connections: Socially Integrated (06/11/2022)   Social Connection and Isolation Panel [NHANES]    Frequency of Communication with Friends and Family: More than three times a week    Frequency of Social Gatherings with Friends and Family: Once a week    Attends Religious Services: 1 to 4 times per year    Active Member of [employer] or Organizations: Yes    Attends Archivist Meetings: 1 to 4 times per year    Marital Status: Married  Human resources officer Violence: Not At Risk (01/17/2022)   Humiliation, Afraid, Rape, and Kick questionnaire    Fear of Current or Ex-Partner:  No    Emotionally Abused: No    Physically Abused: No    Sexually Abused: No    Past Surgical History:  Procedure Laterality Date   APPENDECTOMY  AGE EARLY 20'S   COLONOSCOPY  12/30/2020   every 5 years   HYDROCELE EXCISION  03/31/2012   Procedure: HYDROCELECTOMY ADULT;  Surgeon: Franchot Gallo, MD;  Location: [facility];  Service: Urology;  Laterality: Left;  53 MINS     LEFT EYE LASER RETINA REPAIR  SEPT 2012   RIGHT EYE VITRECTOMY/ INSERTION GLAUCOMA SETON/ LASER REPAIR  12-13-2008   RETINAL ARTERY OCCLUSION /NEOVASCULAR GLAUCOMA/ HEMORRHAGE   RIGHT EYE VITRETOMY/ INSERTION GLAUCOMA SETON X2/ LASER  03-24-2009   RECURRENT HEMORRHAGE/ OCCLUSION INTERNAL SETON   SHOULDER ARTHROSCOPY Right 2005    undescended right testicle removed  1994    Family History  Problem Relation Age of Onset   Glaucoma Mother    Diabetes Mother    Stomach cancer Maternal Grandfather    Stroke Maternal Grandmother     Allergies  Allergen Reactions   Lactose Intolerance (Gi) Diarrhea   Apple Pectin [Pectin] Itching    ITCHY THROAT   Metformin And Related     D/t decreased kidney function    Peach [Prunus Persica] Itching    ITCHY THROAT   Morphine And Related Anxiety    Jittery    Current Outpatient Medications on File Prior to Visit  Medication Sig Dispense Refill   amLODipine (NORVASC) 10 MG tablet Take 1 tablet (10 mg total) by mouth daily. (Patient taking differently: Take 5 mg by mouth daily.) 90 tablet 2   aspirin EC 81 MG tablet Take 81 mg by mouth daily.     atorvastatin (LIPITOR) 10 MG tablet Take 1 tablet (10 mg total) by mouth daily. 90 tablet 3   budesonide-formoterol (SYMBICORT) 160-4.5 MCG/ACT inhaler Inhale 2 puffs into the lungs in the morning and at bedtime. 3 each 3   buPROPion (WELLBUTRIN XL) 300 MG 24 hr tablet Take 1 tablet (300 mg total) by mouth daily. 90 tablet 1   chlorthalidone (HYGROTON) 25 MG tablet Take 25 mg by mouth daily.     Continuous Blood Gluc Receiver (FREESTYLE LIBRE 14 DAY READER) DEVI Use with freestyle libre system 1 each 0   gabapentin (NEURONTIN) 300 MG capsule TAKE ONE CAPSULE BY MOUTH EVERYDAY AT BEDTIME 90 capsule 1   glipiZIDE (GLUCOTROL XL) 2.5 MG 24 hr tablet TAKE ONE TABLET BY MOUTH EVERY MORNING and TAKE ONE TABLET BY MOUTH EVERY EVENING 180 tablet 0   Insulin Pen Needle (B-D ULTRAFINE III SHORT PEN) 31G X 8 MM MISC USE WITH LANUTS PEN 100 each 3   LANTUS SOLOSTAR 100 UNIT/ML Solostar Pen inject 10 units into THE SKIN daily 9 mL 0   losartan (COZAAR) 100 MG tablet Take 1 tablet (100 mg total) by mouth every morning. 90 tablet 1   metoprolol tartrate (LOPRESSOR) 50 MG tablet TAKE ONE TABLET BY MOUTH EVERY MORNING and TAKE ONE TABLET BY MOUTH  EVERYDAY AT BEDTIME 180 tablet 3   tiZANidine (ZANAFLEX) 2 MG tablet TAKE ONE TABLET BY MOUTH EVERYDAY AT BEDTIME 90 tablet 1   No current facility-administered medications on file prior to visit.    BP (!) 142/68 (BP Location: Left Arm, Patient Position: Sitting, Cuff Size: Normal)   Pulse 69   SpO2 99%       Objective:   Physical Exam Vitals and nursing note reviewed.  Constitutional:  Appearance: Normal appearance.  Cardiovascular:     Rate and Rhythm: Normal rate and regular rhythm.     Pulses: Normal pulses.     Heart sounds: Normal heart sounds.  Pulmonary:     Effort: Pulmonary effort is normal.     Breath sounds: Normal breath sounds.  Musculoskeletal:        General: Tenderness (Along right flank and to right elbow.  He has no loss of range of motion, bruising, or deformities noted) present.  Skin:    General: Skin is warm.  Neurological:     General: No focal deficit present.     Mental Status: He is alert and oriented to person, place, and time.  Psychiatric:        Mood and Affect: Mood normal.        Behavior: Behavior normal.        Thought Content: Thought content normal.       Assessment & Plan:  1. Rib pain on right side  - DG Ribs Unilateral Right; Future  2. Right elbow pain  - DG Elbow Complete Right; Future  [name], NP

## 2022-06-14 DIAGNOSIS — M47816 Spondylosis without myelopathy or radiculopathy, lumbar region: Secondary | ICD-10-CM | POA: Diagnosis not present

## 2022-06-18 ENCOUNTER — Other Ambulatory Visit: Payer: Self-pay | Admitting: Adult Health

## 2022-06-18 DIAGNOSIS — E1140 Type 2 diabetes mellitus with diabetic neuropathy, unspecified: Secondary | ICD-10-CM

## 2022-06-18 DIAGNOSIS — Z794 Long term (current) use of insulin: Secondary | ICD-10-CM

## 2022-06-19 ENCOUNTER — Other Ambulatory Visit: Payer: Self-pay | Admitting: Adult Health

## 2022-06-19 DIAGNOSIS — Z794 Long term (current) use of insulin: Secondary | ICD-10-CM

## 2022-06-19 DIAGNOSIS — G8191 Hemiplegia, unspecified affecting right dominant side: Secondary | ICD-10-CM

## 2022-06-20 ENCOUNTER — Telehealth: Payer: Self-pay | Admitting: Pharmacist

## 2022-06-20 NOTE — Progress Notes (Signed)
Chronic Care Management Pharmacy Assistant   Name: Russell Austin  MRN: [id number] DOB: 11/23/1950  Reason for Encounter: Medication Review Medication Coordination    Recent office visits:  06/12/22 Russell Peng, NP - Patient presented for rib pain on right side and other concerns. No medication changes.  04/12/22 Russell Peng, NP - Patient presented for Lower extremity edema and other concerns. Increased Bupropion to 300 mg.  Recent consult visits:  04/27/22 Russell Austin - Claims encounter for Chronic Kidney disease stage 4 and other concerns. No other visit details available.  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 No other visit details available.  Hospital visits:  None in previous 6 months  Medications: Outpatient Encounter Medications as of 06/20/2022  Medication Sig   amLODipine (NORVASC) 10 MG tablet Take 1 tablet (10 mg total) by mouth daily. (Patient taking differently: Take 5 mg by mouth daily.)   aspirin EC 81 MG tablet Take 81 mg by mouth daily.   atorvastatin (LIPITOR) 10 MG tablet TAKE ONE TABLET BY MOUTH EVERY MORNING   budesonide-formoterol (SYMBICORT) 160-4.5 MCG/ACT inhaler Inhale 2 puffs into the lungs in the morning and at bedtime.   buPROPion (WELLBUTRIN XL) 300 MG 24 hr tablet Take 1 tablet (300 mg total) by mouth daily.   chlorthalidone (HYGROTON) 25 MG tablet Take 25 mg by mouth daily.   Continuous Blood Gluc Receiver (FREESTYLE LIBRE 14 DAY READER) DEVI Use with freestyle libre system   gabapentin (NEURONTIN) 300 MG capsule TAKE ONE CAPSULE BY MOUTH EVERYDAY AT BEDTIME   glipiZIDE (GLUCOTROL XL) 2.5 MG 24 hr tablet TAKE ONE TABLET BY MOUTH EVERY MORNING and TAKE ONE TABLET BY MOUTH EVERY EVENING   Insulin Pen Needle (B-D ULTRAFINE III SHORT PEN) 31G X 8 MM MISC USE WITH LANUTS PEN   LANTUS SOLOSTAR 100 UNIT/ML Solostar Pen Inject 10 units into THE SKIN daily   losartan  (COZAAR) 100 MG tablet TAKE ONE TABLET BY MOUTH EVERY MORNING   metoprolol tartrate (LOPRESSOR) 50 MG tablet TAKE ONE TABLET BY MOUTH EVERY MORNING and TAKE ONE TABLET BY MOUTH EVERYDAY AT BEDTIME   tiZANidine (ZANAFLEX) 2 MG tablet TAKE ONE TABLET BY MOUTH EVERYDAY AT BEDTIME   No facility-administered encounter medications on file as of 06/20/2022.  Reviewed chart for medication changes ahead of medication coordination call.  BP Readings from Last 3 Encounters:  06/12/22 (!) 142/68  04/12/22 (!) 150/80  02/13/22 134/78    Lab Results  Component Value Date   HGBA1C 7.7 (H) 12/19/2021     Patient obtains medications through Adherence Packaging  90 Days   Last adherence delivery included:  B-D Ultrafine short Pen needles Freestyle Libre Sensors  Tizanidine 2 mg - take one tablet at bedtime Gabapentin 300 mg -  take one capsule at bedtime Glipizide ER 2.5 mg - take one tablet every morning and one tablet every evening Bupropion SR 150 mg - take one tablet every morning Aspirin 81 mg - take one tablet at breakfast Chlorthalidone 25 mg - 1/2 tablet at breakfast  Atorvastatin 10 mg - take one tablet at breakfast Amlodipine 5 mg - take one tablet at breakfast Metoprolol Tartrate 50 mg - take one tablet every morning and one tablet every evening Losartan 100 mg - take one tablet every morning   Lantus delivered on 03/21/22 pt confirms receipt and is on auto fill for next month he is aware   Confirmed delivery date  of 04/03/22, advised patient that pharmacy will contact them the morning of delivery.      Patient is due for next adherence delivery on: 07/02/22. Called patient and reviewed medications and coordinated delivery. Packs 90 DS  This delivery to include: B-D Ultrafine short Pen needles Freestyle Libre Sensors  Tizanidine 2 mg - take one tablet at bedtime Gabapentin 300 mg -  take one capsule at bedtime Glipizide ER 2.5 mg - take one tablet every morning and one tablet  every evening Bupropion HCL 300 mg - take one tablet every morning Aspirin 81 mg - take one tablet at breakfast Chlorthalidone 25 mg - 1/2 tablet at breakfast  Atorvastatin 10 mg - take one tablet at breakfast Amlodipine 5 mg - take one tablet at breakfast Metoprolol Tartrate 50 mg - take one tablet every morning and one tablet every evening Losartan 100 mg - take one tablet every morning Torsemide 20 mg - Take one tab daily at breakfast  Lantus on Auto fill to be sent to pt 06/20/22  Call to [facility] requested fills for Torsemide  Confirmed delivery date of 07/02/22, advised patient that pharmacy will contact them the morning of delivery.    Notes: Inquired with patient if he has switched over to the [place] [facility] and received the e-mail to set up connection he reports he did receive the email and has responded to it as far as he recalls.   Care Gaps: Flu Vaccine - Overdue Eye Exam - Overdue HGB A1C - Overdue AWV- 5/23 CCM- 11/23 BP- 150/80 04/12/22 Lab Results  Component Value Date   HGBA1C 7.7 (H) 12/19/2021    Star Rating Drugs: Losartan 100 mg - Last filled 03/28/22 90 DS at [facility] Atorvastatin 10 mg - Last filled 03/29/22 90 DS at [facility] Glipizide 2.5 mg - Last filled 03/29/22 90 DS at [facility] Pharmacist Assistant [phone]

## 2022-06-26 ENCOUNTER — Telehealth: Payer: Self-pay | Admitting: Adult Health

## 2022-06-26 DIAGNOSIS — E1140 Type 2 diabetes mellitus with diabetic neuropathy, unspecified: Secondary | ICD-10-CM

## 2022-06-26 MED ORDER — FREESTYLE LIBRE 14 DAY READER DEVI: 0 refills | Status: DC

## 2022-06-26 NOTE — Telephone Encounter (Signed)
Requesting refill Continuous Blood Gluc Receiver (FREESTYLE LIBRE 14 DAY READER) DEVI   [facility] - [place], [place] - [address] Phone:  [phone]  Fax:  [phone]

## 2022-06-26 NOTE — Telephone Encounter (Signed)
Rx refilled.

## 2022-07-17 DIAGNOSIS — E1140 Type 2 diabetes mellitus with diabetic neuropathy, unspecified: Secondary | ICD-10-CM

## 2022-07-17 LAB — HM DIABETES EYE EXAM

## 2022-07-18 ENCOUNTER — Encounter: Payer: Self-pay | Admitting: Adult Health

## 2022-07-19 ENCOUNTER — Telehealth: Payer: Self-pay | Admitting: Pharmacist

## 2022-07-19 NOTE — Progress Notes (Signed)
Chronic Care Management Pharmacy Assistant   Name: Russell Austin  MRN: [id number] DOB: 1951/06/21  Reason for Encounter: Medication Review Medication Coordination   Recent office visits:  None  Recent consult visits:  None   Hospital visits:  None in previous 6 months  Medications: Outpatient Encounter Medications as of 07/19/2022  Medication Sig   amLODipine (NORVASC) 10 MG tablet Take 1 tablet (10 mg total) by mouth daily. (Patient taking differently: Take 5 mg by mouth daily.)   aspirin EC 81 MG tablet Take 81 mg by mouth daily.   atorvastatin (LIPITOR) 10 MG tablet TAKE ONE TABLET BY MOUTH EVERY MORNING   budesonide-formoterol (SYMBICORT) 160-4.5 MCG/ACT inhaler Inhale 2 puffs into the lungs in the morning and at bedtime.   buPROPion (WELLBUTRIN XL) 300 MG 24 hr tablet Take 1 tablet (300 mg total) by mouth daily.   chlorthalidone (HYGROTON) 25 MG tablet Take 25 mg by mouth daily.   Continuous Blood Gluc Receiver (FREESTYLE LIBRE 14 DAY READER) DEVI Use with freestyle libre system   gabapentin (NEURONTIN) 300 MG capsule TAKE ONE CAPSULE BY MOUTH EVERYDAY AT BEDTIME   glipiZIDE (GLUCOTROL XL) 2.5 MG 24 hr tablet TAKE ONE TABLET BY MOUTH EVERY MORNING and TAKE ONE TABLET BY MOUTH EVERY EVENING   Insulin Pen Needle (B-D ULTRAFINE III SHORT PEN) 31G X 8 MM MISC USE WITH LANUTS PEN   LANTUS SOLOSTAR 100 UNIT/ML Solostar Pen Inject 10 units into THE SKIN daily   losartan (COZAAR) 100 MG tablet TAKE ONE TABLET BY MOUTH EVERY MORNING   metoprolol tartrate (LOPRESSOR) 50 MG tablet TAKE ONE TABLET BY MOUTH EVERY MORNING and TAKE ONE TABLET BY MOUTH EVERYDAY AT BEDTIME   tiZANidine (ZANAFLEX) 2 MG tablet TAKE ONE TABLET BY MOUTH EVERYDAY AT BEDTIME   torsemide (DEMADEX) 20 MG tablet Take 20 mg by mouth daily.   No facility-administered encounter medications on file as of 07/19/2022.   Reviewed chart for medication changes ahead of medication coordination call.   BP Readings  from Last 3 Encounters:  06/12/22 (!) 142/68  04/12/22 (!) 150/80  02/13/22 134/78    Lab Results  Component Value Date   HGBA1C 7.7 (H) 12/19/2021     Patient obtains medications through Adherence Packaging     Last adherence delivery included: B-D Ultrafine short Pen needles Freestyle Libre Sensors  Tizanidine 2 mg - take one tablet at bedtime Gabapentin 300 mg -  take one capsule at bedtime Glipizide ER 2.5 mg - take one tablet every morning and one tablet every evening Bupropion HCL 300 mg - take one tablet every morning Aspirin 81 mg - take one tablet at breakfast Chlorthalidone 25 mg - 1/2 tablet at breakfast  Atorvastatin 10 mg - take one tablet at breakfast Amlodipine 5 mg - take one tablet at breakfast Metoprolol Tartrate 50 mg - take one tablet every morning and one tablet every evening Losartan 100 mg - take one tablet every morning Torsemide 20 mg - Take one tab daily at breakfast   Lantus on Auto fill to be sent to pt 06/20/22   Call to [facility] requested fills for Torsemide   Patient Denied delivery of medications once delivery was attempted stating he had an on hand supply of medications.    Notes: Inquired with patient if he has switched over to the [place] 3 and received the e-mail to set up connection he reports he did receive the email and has responded to it as far as  he recalls   Patient is due for next adherence delivery on: 08/01/22. Called patient and reviewed medications and coordinated delivery. Packs 90 DS  This delivery to include:  Tizanidine 2 mg - take one tablet at bedtime  Gabapentin 300 mg -  take one capsule at bedtime Glipizide ER 2.5 mg - take one tablet every morning and one tablet every evening Bupropion HCL 300 mg - take one tablet every morning Aspirin 81 mg - take one tablet at breakfast Chlorthalidone 25 mg - 1/2 tablet at breakfast  Atorvastatin 10 mg - take one tablet at breakfast Amlodipine 5 mg - take one tablet at  breakfast Metoprolol Tartrate 50 mg - take one tablet every morning and one tablet every evening Losartan 100 mg - take one tablet every morning Torsemide 20 mg - Take one tab daily at breakfast   FreeStyle Libre 3 Sensor device 06/26/2022 84 6 each    Patient declined due to abundance: B-D Ultrafine short Pen needles   Confirmed delivery date of 08/02/22, advised patient that pharmacy will contact them the morning of delivery.  Notes:  Patient reports he is on day 15 of his packs and agreed to delivery date of the 53 th not 75 th, adjusted for him, he further reports he is not liking the [place] 3 sensor Alarms, and would like to use up what he has and possibly go back to the 2's advised he has a call with the pharmacist scheduled at the end of the month and id mention it to her so they may address he was in agreement.    Care Gaps: HGB A1C- Overdue BP- 142/68 06/12/22 AWV- 01/17/22 CCM- 08/01/22 Lab Results  Component Value Date   HGBA1C 7.7 (H) 12/19/2021    Star Rating Drugs: Losartan 100 mg - Last filled 03/28/22 90 DS at [facility] Atorvastatin 10 mg - Last filled 03/29/22 90 DS at [facility] Glipizide 2.5 mg - Last filled 03/29/22 90 DS at [facility] Pharmacist Assistant [phone]

## 2022-07-23 ENCOUNTER — Ambulatory Visit: Payer: HMO | Admitting: Podiatry

## 2022-07-31 ENCOUNTER — Telehealth: Payer: Self-pay | Admitting: Pharmacist

## 2022-07-31 NOTE — Chronic Care Management (AMB) (Signed)
    Chronic Care Management Pharmacy Assistant   Name: LAUREL SMELTZ  MRN: [id number] DOB: 08-17-1951  07/31/22 APPOINTMENT REMINDER   Called Patient No answer, left message of appointment on 08/01/22 at 9:30 via telephone visit with [name], Pharm D.   Notified to have all medications, supplements, blood pressure and/or blood sugar logs available during appointment and to return call if need to reschedule.   Care Gaps: HGB A1C- Overdue BP- 142/68 06/12/22 AWV- 01/17/22 CCM- 08/01/22 Lab Results  Component Value Date   HGBA1C 7.7 (H) 12/19/2021    Star Rating Drug: Losartan 100 mg - Last filled 03/28/22 90 DS at [facility] Atorvastatin 10 mg - Last filled 03/29/22 90 DS at [facility] Glipizide 2.5 mg - Last filled 03/29/22 90 DS at [facility]      Medications: Outpatient Encounter Medications as of 07/31/2022  Medication Sig   amLODipine (NORVASC) 10 MG tablet Take 1 tablet (10 mg total) by mouth daily. (Patient taking differently: Take 5 mg by mouth daily.)   aspirin EC 81 MG tablet Take 81 mg by mouth daily.   atorvastatin (LIPITOR) 10 MG tablet TAKE ONE TABLET BY MOUTH EVERY MORNING   budesonide-formoterol (SYMBICORT) 160-4.5 MCG/ACT inhaler Inhale 2 puffs into the lungs in the morning and at bedtime.   buPROPion (WELLBUTRIN XL) 300 MG 24 hr tablet Take 1 tablet (300 mg total) by mouth daily.   chlorthalidone (HYGROTON) 25 MG tablet Take 25 mg by mouth daily.   Continuous Blood Gluc Receiver (FREESTYLE LIBRE 14 DAY READER) DEVI Use with freestyle libre system   gabapentin (NEURONTIN) 300 MG capsule TAKE ONE CAPSULE BY MOUTH EVERYDAY AT BEDTIME   glipiZIDE (GLUCOTROL XL) 2.5 MG 24 hr tablet TAKE ONE TABLET BY MOUTH EVERY MORNING and TAKE ONE TABLET BY MOUTH EVERY EVENING   Insulin Pen Needle (B-D ULTRAFINE III SHORT PEN) 31G X 8 MM MISC USE WITH LANUTS PEN   LANTUS SOLOSTAR 100 UNIT/ML Solostar Pen Inject 10 units into THE SKIN daily   losartan (COZAAR) 100 MG  tablet TAKE ONE TABLET BY MOUTH EVERY MORNING   metoprolol tartrate (LOPRESSOR) 50 MG tablet TAKE ONE TABLET BY MOUTH EVERY MORNING and TAKE ONE TABLET BY MOUTH EVERYDAY AT BEDTIME   tiZANidine (ZANAFLEX) 2 MG tablet TAKE ONE TABLET BY MOUTH EVERYDAY AT BEDTIME   torsemide (DEMADEX) 20 MG tablet Take 20 mg by mouth daily.   No facility-administered encounter medications on file as of 07/31/2022.      [place] Clinical Pharmacist Assistant [phone]

## 2022-08-01 ENCOUNTER — Telehealth: Payer: Self-pay | Admitting: Pharmacist

## 2022-08-01 ENCOUNTER — Telehealth: Payer: HMO

## 2022-08-01 NOTE — Telephone Encounter (Signed)
  Chronic Care Management   Outreach Note  08/01/2022 Name: GUMARO BRIGHTBILL MRN: [id number] DOB: [birth year]  Referred by: [name], NP  Patient had a phone appointment scheduled with clinical pharmacist today.  An unsuccessful telephone outreach was attempted today. The patient was referred to the pharmacist for assistance with care management and care coordination.   If possible, a message was left to return call to: [phone] or to [facility] at [facility]: Wilson, PharmD, [place] Pharmacist [place] [facility] [place]

## 2022-08-01 NOTE — Progress Notes (Deleted)
Chronic Care Management Pharmacy Note  08/01/2022 Name:  Russell Austin MRN:  [id number] DOB:  1951-08-20  Summary: BP not at goal < 130/80 per office readings A1c not at goal < 7%  Recommendations/Changes made from today's visit: -Recommended bringing BP cuff to next office visit to ensure accuracy -Recommended setting an alarm on his phone for remembering to check BP at home (at least twice a week to start) -Recommended switching to [employer] 3 to manage blood sugars easier/virtually  Plan: BP follow up in 1 month Follow up in 2 months to get connected with [facility]   Subjective: Russell Austin is an 71 y.o. year old male who is a primary patient of [name], NP.  The CCM team was consulted for assistance with disease management and care coordination needs.    Engaged with patient by telephone for follow up visit in response to provider referral for pharmacy case management and/or care coordination services.   Consent to Services:  The patient was given information about Chronic Care Management services, agreed to services, and gave verbal consent prior to initiation of services.  Please see initial visit note for detailed documentation.   Patient Care Team: [name], NP as PCP - General (Family Medicine) [name], Darrick Grinder, MD as Consulting Physician (Allergy and Immunology) [facility] (Ophthalmology) Marshall Cork, MD (Ophthalmology) Zada Finders Joyice Faster, MD as Consulting Physician (Neurosurgery) [name] Shaune Pollack, MD as Consulting Physician (Nephrology) [facility] (Plastic Surgery) Viona Gilmore, [facility] as Pharmacist (Pharmacist)  Recent office visits: 06/12/22 [name], NP - Patient presented for rib pain on right side and other concerns. No medication changes.   04/12/22 [name], NP - Patient presented for Lower extremity edema and other concerns. Increased Bupropion to  300 mg.  02/13/22 [name], NP: Patient presented for back pain. Refill tramadol. Plan for lumbar spine to rule out compression fracture.  Recent consult visits: 04/27/22 [name] - Claims encounter for Chronic Kidney disease stage 4 and other concerns. No other visit details available.   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 No other visit details available.  Hospital visits: None in previous 6 months   Objective:  Lab Results  Component Value Date   CREATININE 3.52 (H) 12/19/2021   BUN 44 (H) 12/19/2021   GFR 16.87 (L) 12/19/2021   GFRNONAA 41 (L) 10/28/2018   GFRAA 47 (L) 10/28/2018   NA 140 12/19/2021   K 4.9 12/19/2021   CALCIUM 9.0 12/19/2021   CO2 25 12/19/2021   GLUCOSE 170 (H) 12/19/2021    Lab Results  Component Value Date/Time   HGBA1C 7.7 (H) 12/19/2021 07:41 AM   HGBA1C 7.1 (A) 09/08/2021 07:12 AM   HGBA1C 6.4 (A) 06/08/2021 07:14 AM   HGBA1C 7.1 (H) 12/09/2020 08:04 AM   HGBA1C 7.3 (A) 02/11/2020 07:09 AM   HGBA1C 6.6 07/23/2018 09:00 AM   GFR 16.87 (L) 12/19/2021 07:41 AM   GFR 15.40 (L) 12/09/2020 08:04 AM   MICROALBUR 50.8 (H) 12/13/2014 08:01 AM   MICROALBUR 27.7 (H) 12/16/2013 09:38 AM    Last diabetic Eye exam:  Lab Results  Component Value Date/Time   HMDIABEYEEXA Retinopathy (A) 07/17/2022 12:00 AM    Last diabetic Foot exam:  Lab Results  Component Value Date/Time   HMDIABFOOTEX done 02/02/2010 12:00 AM     Lab Results  Component Value Date   CHOL 118 12/19/2021  HDL 31.60 (L) 12/19/2021   LDLCALC 62 12/19/2021   TRIG 123.0 12/19/2021   CHOLHDL 4 12/19/2021       Latest Ref Rng & Units 12/19/2021    7:41 AM 12/09/2020    8:04 AM 11/11/2019    7:44 AM  Hepatic Function  Total Protein 6.0 - 8.3 g/dL 6.5  6.3  5.8   Albumin 3.5 - 5.2 g/dL 3.7  3.7  3.4   AST 0 - 37 U/L _0 ALT 0 - 53 U/L _1 Alk Phosphatase 39 - 117 U/L 145  135   110   Total Bilirubin 0.2 - 1.2 mg/dL 0.9  1.0  1.0     Lab Results  Component Value Date/Time   TSH 4.86 12/19/2021 07:41 AM   TSH 2.03 12/09/2020 08:04 AM       Latest Ref Rng & Units 12/19/2021    7:41 AM 12/09/2020    8:04 AM 11/11/2019    7:44 AM  CBC  WBC 4.0 - 10.5 K/uL 4.0  3.7  4.2   Hemoglobin 13.0 - 17.0 g/dL 11.9  12.2  11.7   Hematocrit 39.0 - 52.0 % 35.4  37.0  34.9   Platelets 150.0 - 400.0 K/uL 248.0  258.0  270.0     No results found for: "VD25OH"  Clinical ASCVD: Yes  The ASCVD Risk score (Arnett DK, et al., 2019) failed to calculate for the following reasons:   The valid total cholesterol range is 130 to 320 mg/dL       04/12/2022   10:09 AM 01/17/2022   12:44 PM 01/08/2022    1:44 PM  Depression screen PHQ 2/9  Decreased Interest 2 0 0  Down, Depressed, Hopeless 2 0 0  PHQ - 2 Score 4 0 0  Altered sleeping 3 0 0  Tired, decreased energy 3 0 0  Change in appetite 3 0 0  Feeling bad or failure about yourself  0 0 0  Trouble concentrating 0 0 1  Moving slowly or fidgety/restless 3 0 0  Suicidal thoughts 0 0 0  PHQ-9 Score 16 0 1  Difficult doing work/chores Not difficult at all Not difficult at all Not difficult at all      Social History   Tobacco Use  Smoking Status Never  Smokeless Tobacco Never   BP Readings from Last 3 Encounters:  06/12/22 (!) 142/68  04/12/22 (!) 150/80  02/13/22 134/78   Pulse Readings from Last 3 Encounters:  06/12/22 69  04/12/22 69  02/13/22 70   Wt Readings from Last 3 Encounters:  04/12/22 175 lb (79.4 kg)  02/13/22 174 lb 9.6 oz (79.2 kg)  01/17/22 185 lb (83.9 kg)   BMI Readings from Last 3 Encounters:  04/12/22 25.84 kg/m  02/13/22 25.78 kg/m  01/17/22 27.32 kg/m    Assessment/Interventions: Review of patient past medical history, allergies, medications, health status, including review of consultants reports, laboratory and other test data, was performed as part of comprehensive evaluation and  provision of chronic care management services.   SDOH:  (Social Determinants of Health) assessments and interventions performed: Yes *** SDOH Interventions    Flowsheet Row Office Visit from 01/17/2022 in [place] at [place] [facility] from 07/17/2021 in [place] at [place] Management from 03/11/2020 in [place] at [place] Management from 02/09/2020 in [place] at [place] from 10/10/2017 in [place]  at [place] Interventions Intervention Not Indicated Intervention Not Indicated -- -- --  Housing Interventions Intervention Not Indicated Intervention Not Indicated -- -- --  Transportation Interventions Intervention Not Indicated Intervention Not Indicated Intervention Not Indicated Intervention Not Indicated  [Patient states spouse helps with transportation.] --  Depression Interventions/Treatment  -- -- -- -- Medication  Financial Strain Interventions Intervention Not Indicated Intervention Not Indicated -- Intervention Not Indicated --  Physical Activity Interventions [place] Other (Comments)  [reviewed insurance fitness benefit] -- -- --  Stress Interventions Intervention Not Indicated Intervention Not Indicated -- -- --  Social Connections Interventions Intervention Not Indicated -- -- -- --      SDOH Screenings   Food Insecurity: No Food Insecurity (06/11/2022)  Housing: Low Risk  (06/11/2022)  Transportation Needs: No Transportation Needs (06/11/2022)  Alcohol Screen: Low Risk  (06/11/2022)  Depression (PHQ2-9): High Risk (04/12/2022)  Financial Resource Strain: Low Risk  (06/11/2022)  Physical Activity: Inactive (06/11/2022)  Social Connections: Socially Integrated (06/11/2022)  Stress: Stress Concern Present (06/11/2022)  Tobacco Use: Low Risk  (06/12/2022)    CCM Care Plan  Allergies  Allergen Reactions    Lactose Intolerance (Gi) Diarrhea   Apple Pectin [Pectin] Itching    ITCHY THROAT   Metformin And Related     D/t decreased kidney function    Peach [Prunus Persica] Itching    ITCHY THROAT   Morphine And Related Anxiety    Jittery    Medications Reviewed Today     Reviewed by Viona Gilmore, [facility] (Pharmacist) on 06/21/22 at 1502  Med List Status: <None>   Medication Order Taking? Sig Documenting Provider Last Dose Status Informant  amLODipine (NORVASC) 10 MG tablet [id number]  Take 1 tablet (10 mg total) by mouth daily.  Patient taking differently: Take 5 mg by mouth daily.   Nafziger, [name], NP  Active   aspirin EC 81 MG tablet [id number]  Take 81 mg by mouth daily. [provider]  Active   atorvastatin (LIPITOR) 10 MG tablet [id number]  TAKE ONE TABLET BY MOUTH EVERY MORNING Nafziger, [name], NP  Active   budesonide-formoterol [facility]) 160-4.5 MCG/ACT inhaler [id number]  Inhale 2 puffs into the lungs in the morning and at bedtime. Nafziger, [name], NP  Active   buPROPion (WELLBUTRIN XL) 300 MG 24 hr tablet [id number]  Take 1 tablet (300 mg total) by mouth daily. Nafziger, [name], NP  Active   chlorthalidone (HYGROTON) 25 MG tablet [id number]  Take 25 mg by mouth daily. [provider]  Active   Continuous Blood Gluc Receiver (FREESTYLE LIBRE 14 DAY READER) DEVI [id number]  Use with freestyle libre system Nafziger, [place], NP  Active   gabapentin (NEURONTIN) 300 MG capsule [id number]  TAKE ONE CAPSULE BY MOUTH EVERYDAY AT BEDTIME Nafziger, [name], NP  Active   glipiZIDE (GLUCOTROL XL) 2.5 MG 24 hr tablet [id number]  TAKE ONE TABLET BY MOUTH EVERY MORNING and TAKE ONE TABLET BY MOUTH EVERY EVENING Nafziger, [name], NP  Active   Insulin Pen Needle (B-D ULTRAFINE III SHORT PEN) 31G X 8 MM MISC [id number]  USE WITH LANUTS PEN Nafziger, Cory, NP  Active   LANTUS SOLOSTAR 100 UNIT/ML Solostar Pen [id number]  Inject 10 units into THE SKIN daily Nafziger, [name], NP  Active   losartan (COZAAR)  100 MG tablet [id number]  TAKE ONE TABLET BY MOUTH EVERY MORNING Nafziger, [name], NP  Active   metoprolol tartrate (LOPRESSOR) 50 MG  tablet [id number]  TAKE ONE TABLET BY MOUTH EVERY MORNING and TAKE ONE TABLET BY MOUTH EVERYDAY AT BEDTIME Nafziger, [name], NP  Active   tiZANidine (ZANAFLEX) 2 MG tablet [id number]  TAKE ONE TABLET BY MOUTH EVERYDAY AT BEDTIME Nafziger, [name], NP  Active   torsemide (DEMADEX) 20 MG tablet [id number] Yes Take 20 mg by mouth daily. 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)    CKD stage 2 due to type 2 diabetes mellitus ([place]) 02/12/2015   Right hemiparesis ([place])    CKD (chronic kidney disease) stage 5, GFR less than 15 ml/min ([place]) 02/10/2015   Type 2 diabetes mellitus with diabetic neuropathy ([place]) 02/10/2015   Neck pain    Numbness and tingling 02/09/2015   Left shoulder pain 07/17/2012   Plantar fasciitis, bilateral 07/17/2012   Peripheral neuropathy 11/06/2007   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   Essential hypertension 07/08/2007   EXTRINSIC ASTHMA, UNSPECIFIED 06/25/2007    Immunization History  Administered Date(s) Administered   COVID-19, mRNA, vaccine(Comirnaty)12 years and older 07/16/2022   Fluad Quad(high Dose 65+) 05/14/2019, 05/26/2020, 06/08/2021, 07/16/2022   Influenza Split 07/02/2011, 07/17/2012   Influenza, High Dose Seasonal PF 05/29/2017   Influenza,inj,Quad PF,6+ Mos 05/13/2013, 06/15/2015, 06/19/2016,  06/30/2018   Influenza-Unspecified 06/30/2018   PFIZER Comirnaty(Gray Top)Covid-19 Tri-Sucrose Vaccine 06/14/2021   PFIZER(Purple Top)SARS-COV-2 Vaccination 09/25/2019, 10/16/2019, 06/07/2020   Pneumococcal Conjugate-13 10/10/2017   Pneumococcal Polysaccharide-23 12/20/2014, 11/11/2019   Td 02/02/2010   Zoster Recombinat (Shingrix) 06/14/2021, 08/14/2021   Patient lost about 10 lbs intentionally because he was watching what he is eating recently. He has not been checking his BP at home regularly because he is lazy and forgets. Recommended setting an alarm on his phone.   -needs PCP visit for repeat A1c  -alarms on libre 3?  -still not connected  -checking BP?  Conditions to be addressed/monitored:  Hypertension, Hyperlipidemia, Diabetes, Chronic Kidney Disease, and Irritability and Pain  Conditions addressed this visit: Hypertension, CKD, diabetes  There are no care plans that you recently modified to display for this patient.    Medication Assistance: None required.  Patient affirms current coverage meets needs.  Compliance/Adherence/Medication fill history: Care Gaps: A1c BP - 142/68 (06/12/22) Last A1C - 7.7 on 12/19/21  Star-Rating Drugs: Losartan 100 mg - Last filled 07/29/22 90 DS at [facility] Atorvastatin 10 mg - Last filled 07/29/22 90 DS at [facility] Glipizide 2.5 mg - Last filled 07/29/22 90 DS at [facility]  Patient's preferred pharmacy is:  [facility] - [place], [place] - [address] [place] [address] Phone: [phone] Fax: [phone]   Uses pill box? Yes Pt endorses 95% compliance  We discussed: Benefits of medication synchronization, packaging and delivery as well as enhanced pharmacist oversight with [facility]. Patient decided to: Utilize [facility] pharmacy for medication synchronization, packaging and delivery  Care Plan and Follow Up Patient Decision:  Patient agrees to Care Plan and  Follow-up.  Plan: The care management team will reach out to the patient again over the next 60 days.  [name], PharmD, [employer] Pharmacist [place] at [place]

## 2022-08-13 ENCOUNTER — Telehealth: Payer: Self-pay | Admitting: Adult Health

## 2022-08-13 NOTE — Telephone Encounter (Signed)
Pt called to say he received a letter in the mail stating he was a 'No Show' for his appointment on 08/01/22 with the pharmacist.  Pt states he and his wife were home all day, and the phone never rang.

## 2022-08-20 ENCOUNTER — Telehealth: Payer: HMO

## 2022-08-20 ENCOUNTER — Telehealth: Payer: Self-pay | Admitting: Pharmacist

## 2022-08-20 NOTE — Chronic Care Management (AMB) (Signed)
    Chronic Care Management Pharmacy Assistant   Name: Russell Austin  MRN: [id number] DOB: 19-Jun-1951  08/20/22 APPOINTMENT REMINDER    Patient was reminded to have all medications, supplements and any blood glucose and blood pressure readings available for review with [name], Pharm. D, for telephone visit on 08/21/22 at 9:15.    Care Gaps: Diabetic Urine  - Overdue TDAP - Overdue BP- 142/68 06/12/22 AWV- 01/17/22  Lab Results  Component Value Date   HGBA1C 7.7 (H) 12/19/2021    Star Rating Drug: Losartan 100 mg - Last filled 07/30/22 90 DS at [facility] Atorvastatin 10 mg - Last filled 07/30/22 90 [facility] at [facility] Glipizide 2.5 mg - Last filled 07/30/22 90 [facility] at [facility]       Medications: Outpatient Encounter Medications as of 08/20/2022  Medication Sig   amLODipine (NORVASC) 10 MG tablet Take 1 tablet (10 mg total) by mouth daily. (Patient taking differently: Take 5 mg by mouth daily.)   aspirin EC 81 MG tablet Take 81 mg by mouth daily.   atorvastatin (LIPITOR) 10 MG tablet TAKE ONE TABLET BY MOUTH EVERY MORNING   budesonide-formoterol (SYMBICORT) 160-4.5 MCG/ACT inhaler Inhale 2 puffs into the lungs in the morning and at bedtime.   buPROPion (WELLBUTRIN XL) 300 MG 24 hr tablet Take 1 tablet (300 mg total) by mouth daily.   chlorthalidone (HYGROTON) 25 MG tablet Take 25 mg by mouth daily.   Continuous Blood Gluc Receiver (FREESTYLE LIBRE 14 DAY READER) DEVI Use with freestyle libre system   gabapentin (NEURONTIN) 300 MG capsule TAKE ONE CAPSULE BY MOUTH EVERYDAY AT BEDTIME   glipiZIDE (GLUCOTROL XL) 2.5 MG 24 hr tablet TAKE ONE TABLET BY MOUTH EVERY MORNING and TAKE ONE TABLET BY MOUTH EVERY EVENING   Insulin Pen Needle (B-D ULTRAFINE III SHORT PEN) 31G X 8 MM MISC USE WITH LANUTS PEN   LANTUS SOLOSTAR 100 UNIT/ML Solostar Pen Inject 10 units into THE SKIN daily   losartan (COZAAR) 100 MG tablet TAKE ONE TABLET BY MOUTH EVERY MORNING   metoprolol tartrate  (LOPRESSOR) 50 MG tablet TAKE ONE TABLET BY MOUTH EVERY MORNING and TAKE ONE TABLET BY MOUTH EVERYDAY AT BEDTIME   tiZANidine (ZANAFLEX) 2 MG tablet TAKE ONE TABLET BY MOUTH EVERYDAY AT BEDTIME   torsemide (DEMADEX) 20 MG tablet Take 20 mg by mouth daily.   No facility-administered encounter medications on file as of 08/20/2022.      [place] Clinical Pharmacist Assistant [phone]

## 2022-08-20 NOTE — Progress Notes (Unsigned)
Chronic Care Management Pharmacy Note  08/21/2022 Name:  Russell Austin MRN:  [id number] DOB:  November 27, 1950  Summary: BP not at goal < 130/80 per office readings A1c not at goal < 7% Pt is having overnight lows per [place]  Recommendations/Changes made from today's visit: -Recommended bringing BP cuff to next office visit to ensure accuracy -Recommended repeat A1c -Consider decreasing glipizide to once daily in the morning depending on A1c and CGM report -Recommended checking BG prior to bedtime and ensuring > 100 -Requested glucometer and testing supplies  Plan: Scheduled DM follow up with PCP BP follow up in 1 month Follow up in 4 months  Subjective: [name] is an 71 y.o. year old male who is a primary patient of [name], NP.  The CCM team was consulted for assistance with disease management and care coordination needs.    Engaged with patient by telephone for follow up visit in response to provider referral for pharmacy case management and/or care coordination services.   Consent to Services:  The patient was given information about Chronic Care Management services, agreed to services, and gave verbal consent prior to initiation of services.  Please see initial visit note for detailed documentation.   Patient Care Team: [name], NP as PCP - General (Family Medicine) [name], Darrick Grinder, MD as Consulting Physician (Allergy and Immunology) [facility] (Ophthalmology) Marshall Cork, MD (Ophthalmology) Zada Finders Joyice Faster, MD as Consulting Physician (Neurosurgery) [name] Shaune Pollack, MD as Consulting Physician (Nephrology) [facility] (Plastic Surgery) Viona Gilmore, [facility] as Pharmacist (Pharmacist)  Recent office visits: 06/12/22 [name], NP - Patient presented for rib pain on right side and other concerns. No medication changes.   04/12/22 [name], NP - Patient presented for  Lower extremity edema and other concerns. Increased Bupropion to 300 mg.  02/13/22 [name], NP: Patient presented for back pain. Refill tramadol. Plan for lumbar spine to rule out compression fracture.  Recent consult visits: 04/27/22 [name] - Claims encounter for Chronic Kidney disease stage 4 and other concerns. No other visit details available.   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 No other visit details available.  Hospital visits: None in previous 6 months   Objective:  Lab Results  Component Value Date   CREATININE 3.52 (H) 12/19/2021   BUN 44 (H) 12/19/2021   GFR 16.87 (L) 12/19/2021   GFRNONAA 41 (L) 10/28/2018   GFRAA 47 (L) 10/28/2018   NA 140 12/19/2021   K 4.9 12/19/2021   CALCIUM 9.0 12/19/2021   CO2 25 12/19/2021   GLUCOSE 170 (H) 12/19/2021    Lab Results  Component Value Date/Time   HGBA1C 7.7 (H) 12/19/2021 07:41 AM   HGBA1C 7.1 (A) 09/08/2021 07:12 AM   HGBA1C 6.4 (A) 06/08/2021 07:14 AM   HGBA1C 7.1 (H) 12/09/2020 08:04 AM   HGBA1C 7.3 (A) 02/11/2020 07:09 AM   HGBA1C 6.6 07/23/2018 09:00 AM   GFR 16.87 (L) 12/19/2021 07:41 AM   GFR 15.40 (L) 12/09/2020 08:04 AM   MICROALBUR 50.8 (H) 12/13/2014 08:01 AM   MICROALBUR 27.7 (H) 12/16/2013 09:38 AM    Last diabetic Eye exam:  Lab Results  Component Value Date/Time   HMDIABEYEEXA Retinopathy (A) 07/17/2022 12:00 AM    Last diabetic Foot exam:  Lab Results  Component Value Date/Time   HMDIABFOOTEX done 02/02/2010 12:00 AM     Lab Results  Component Value  Date   CHOL 118 12/19/2021   HDL 31.60 (L) 12/19/2021   LDLCALC 62 12/19/2021   TRIG 123.0 12/19/2021   CHOLHDL 4 12/19/2021       Latest Ref Rng & Units 12/19/2021    7:41 AM 12/09/2020    8:04 AM 11/11/2019    7:44 AM  Hepatic Function  Total Protein 6.0 - 8.3 g/dL 6.5  6.3  5.8   Albumin 3.5 - 5.2 g/dL 3.7  3.7  3.4   AST 0 - 37 U/L _0 ALT 0 - 53 U/L _1 Alk Phosphatase 39 - 117 U/L 145  135  110   Total Bilirubin 0.2 - 1.2 mg/dL 0.9  1.0  1.0     Lab Results  Component Value Date/Time   TSH 4.86 12/19/2021 07:41 AM   TSH 2.03 12/09/2020 08:04 AM       Latest Ref Rng & Units 12/19/2021    7:41 AM 12/09/2020    8:04 AM 11/11/2019    7:44 AM  CBC  WBC 4.0 - 10.5 K/uL 4.0  3.7  4.2   Hemoglobin 13.0 - 17.0 g/dL 11.9  12.2  11.7   Hematocrit 39.0 - 52.0 % 35.4  37.0  34.9   Platelets 150.0 - 400.0 K/uL 248.0  258.0  270.0     No results found for: "VD25OH"  Clinical ASCVD: Yes  The ASCVD Risk score (Arnett DK, et al., 2019) failed to calculate for the following reasons:   The valid total cholesterol range is 130 to 320 mg/dL       04/12/2022   10:09 AM 01/17/2022   12:44 PM 01/08/2022    1:44 PM  Depression screen PHQ 2/9  Decreased Interest 2 0 0  Down, Depressed, Hopeless 2 0 0  PHQ - 2 Score 4 0 0  Altered sleeping 3 0 0  Tired, decreased energy 3 0 0  Change in appetite 3 0 0  Feeling bad or failure about yourself  0 0 0  Trouble concentrating 0 0 1  Moving slowly or fidgety/restless 3 0 0  Suicidal thoughts 0 0 0  PHQ-9 Score 16 0 1  Difficult doing work/chores Not difficult at all Not difficult at all Not difficult at all      Social History   Tobacco Use  Smoking Status Never  Smokeless Tobacco Never   BP Readings from Last 3 Encounters:  06/12/22 (!) 142/68  04/12/22 (!) 150/80  02/13/22 134/78   Pulse Readings from Last 3 Encounters:  06/12/22 69  04/12/22 69  02/13/22 70   Wt Readings from Last 3 Encounters:  04/12/22 175 lb (79.4 kg)  02/13/22 174 lb 9.6 oz (79.2 kg)  01/17/22 185 lb (83.9 kg)   BMI Readings from Last 3 Encounters:  04/12/22 25.84 kg/m  02/13/22 25.78 kg/m  01/17/22 27.32 kg/m    Assessment/Interventions: Review of patient past medical history, allergies, medications, health status, including review of consultants reports, laboratory and  other test data, was performed as part of comprehensive evaluation and provision of chronic care management services.   SDOH:  (Social Determinants of Health) assessments and interventions performed: Yes  SDOH Interventions    Flowsheet Row Chronic Care Management from 08/21/2022 in [place] at [place] from 01/17/2022 in [place] at [place] Management from 07/17/2021 in [place] at [facility] Management from 03/11/2020 in [place] at  [place] [facility] Management from 02/09/2020 in [place] at [facility] from 10/10/2017 in [place] at [place] Interventions -- Intervention Not Indicated Intervention Not Indicated -- -- --  Housing Interventions -- Intervention Not Indicated Intervention Not Indicated -- -- --  Transportation Interventions -- Intervention Not Indicated Intervention Not Indicated Intervention Not Indicated Intervention Not Indicated  [Patient states spouse helps with transportation.] --  Depression Interventions/Treatment  -- -- -- -- -- Medication  Financial Strain Interventions Intervention Not Indicated Intervention Not Indicated Intervention Not Indicated -- Intervention Not Indicated --  Physical Activity Interventions -- [place] Other (Comments)  [reviewed insurance fitness benefit] -- -- --  Stress Interventions -- Intervention Not Indicated Intervention Not Indicated -- -- --  Social Connections Interventions -- Intervention Not Indicated -- -- -- --      SDOH Screenings   Food Insecurity: No Food Insecurity (06/11/2022)  Housing: Low Risk  (06/11/2022)  Transportation Needs: No Transportation Needs (06/11/2022)  Alcohol Screen: Low Risk  (06/11/2022)  Depression (PHQ2-9): High Risk (04/12/2022)  Financial Resource Strain: Low Risk  (08/21/2022)  Physical Activity: Inactive (06/11/2022)   Social Connections: Socially Integrated (06/11/2022)  Stress: Stress Concern Present (06/11/2022)  Tobacco Use: Low Risk  (06/12/2022)    CCM Care Plan  Allergies  Allergen Reactions   Lactose Intolerance (Gi) Diarrhea   Apple Pectin [Pectin] Itching    ITCHY THROAT   Metformin And Related     D/t decreased kidney function    Peach [Prunus Persica] Itching    ITCHY THROAT   Morphine And Related Anxiety    Jittery    Medications Reviewed Today     Reviewed by Viona Gilmore, [facility] (Pharmacist) on 08/21/22 at [place] List Status: <None>   Medication Order Taking? Sig Documenting Provider Last Dose Status Informant  amLODipine (NORVASC) 10 MG tablet [id number] No Take 1 tablet (10 mg total) by mouth daily.  Patient taking differently: Take 5 mg by mouth daily.   Nafziger, [name], NP Taking Active   aspirin EC 81 MG tablet [id number] No Take 81 mg by mouth daily. [provider] Taking Active   atorvastatin (LIPITOR) 10 MG tablet [id number]  TAKE ONE TABLET BY MOUTH EVERY MORNING Nafziger, [name], NP  Active   budesonide-formoterol [facility]) 160-4.5 MCG/ACT inhaler [id number] No Inhale 2 puffs into the lungs in the morning and at bedtime. Nafziger, [name], NP Taking Active   buPROPion (WELLBUTRIN XL) 300 MG 24 hr tablet [id number] No Take 1 tablet (300 mg total) by mouth daily. Nafziger, [name], NP Taking Active   chlorthalidone (HYGROTON) 25 MG tablet [id number] No Take 25 mg by mouth daily. [provider] Taking Active   Continuous Blood Gluc Receiver (FREESTYLE LIBRE 14 DAY READER) DEVI [id number]  Use with freestyle libre system Nafziger, [place], NP  Active   gabapentin (NEURONTIN) 300 MG capsule [id number]  TAKE ONE CAPSULE BY MOUTH EVERYDAY AT BEDTIME Nafziger, [name], NP  Active   glipiZIDE (GLUCOTROL XL) 2.5 MG 24 hr tablet [id number]  TAKE ONE TABLET BY MOUTH EVERY MORNING and TAKE ONE TABLET BY MOUTH EVERY EVENING Nafziger, [name], NP  Active   Insulin Pen Needle (B-D  ULTRAFINE III SHORT PEN) 31G X 8 MM MISC [id number] No USE WITH LANUTS PEN Nafziger, Cory, NP Taking Active   LANTUS SOLOSTAR 100 UNIT/ML Solostar Pen [id number]  Inject 10 units into THE SKIN daily [name], NP  Active   losartan (COZAAR) 100 MG tablet [id number]  TAKE ONE TABLET BY MOUTH EVERY MORNING Nafziger, [name], NP  Active   metoprolol tartrate (LOPRESSOR) 50 MG tablet [id number] No TAKE ONE TABLET BY MOUTH EVERY MORNING and TAKE ONE TABLET BY MOUTH EVERYDAY AT BEDTIME Nafziger, [name], NP Taking Active   tiZANidine (ZANAFLEX) 2 MG tablet [id number]  TAKE ONE TABLET BY MOUTH EVERYDAY AT BEDTIME Nafziger, [name], NP  Active   torsemide (DEMADEX) 20 MG tablet [id number]  Take 20 mg by mouth daily. 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)    CKD stage 2 due to type 2 diabetes mellitus ([place]) 02/12/2015   Right hemiparesis ([place])    CKD (chronic kidney disease) stage 5, GFR less than 15 ml/min ([place]) 02/10/2015   Type 2 diabetes mellitus with diabetic neuropathy ([place]) 02/10/2015   Neck pain    Numbness and tingling 02/09/2015   Left shoulder pain 07/17/2012   Plantar fasciitis, bilateral 07/17/2012   Peripheral neuropathy 11/06/2007   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   Essential hypertension 07/08/2007   EXTRINSIC ASTHMA, UNSPECIFIED 06/25/2007    Immunization History  Administered Date(s) Administered   COVID-19, mRNA, vaccine(Comirnaty)12 years and  older 07/16/2022   Fluad Quad(high Dose 65+) 05/14/2019, 05/26/2020, 06/08/2021, 07/16/2022   Influenza Split 07/02/2011, 07/17/2012   Influenza, High Dose Seasonal PF 05/29/2017   Influenza,inj,Quad PF,6+ Mos 05/13/2013, 06/15/2015, 06/19/2016, 06/30/2018   Influenza-Unspecified 06/30/2018   PFIZER Comirnaty(Gray Top)Covid-19 Tri-Sucrose Vaccine 06/14/2021   PFIZER(Purple Top)SARS-COV-2 Vaccination 09/25/2019, 10/16/2019, 06/07/2020   Pneumococcal Conjugate-13 10/10/2017   Pneumococcal Polysaccharide-23 12/20/2014, 11/11/2019   Td 02/02/2010   Zoster Recombinat (Shingrix) 06/14/2021, 08/14/2021   Patient reports he is feeling good overall without many complaints.  Patient reports the libre beeps at him in the middle of the night. He reports when he goes to bed the readings are around the 70s and maybe in the 80s. He feels nervous with his numbers in 101s. He no longer has a glucometer or testing supplies so he cannot verify this. Recommended making sure readings are > 100 at night when going to sleep to avoid overnight lows.  Patient reports sometimes he checks his BP before his medications in the morning. He also drinks coffee in the morning and will try to separate this from when he is checking his BP.  Conditions to be addressed/monitored:  Hypertension, Hyperlipidemia, Diabetes, Chronic Kidney Disease, and Irritability and Pain  Conditions addressed this visit: Hypertension, CKD, diabetes  Care Plan : [place]  Updates made by Viona Gilmore, [place] since 08/21/2022 12:00 AM     Problem: Problem: Hypertension, Hyperlipidemia, Diabetes, Chronic Kidney Disease, and Irritability and Pain      Long-Range Goal: Patient-Specific Goal   Start Date: 04/19/2021  Expected End Date: 04/19/2022  Recent Progress: On track  Priority: High  Note:   Current Barriers:  Unable to independently monitor therapeutic efficacy Unable to achieve control of blood  pressure  Unable to  self administer medications as prescribed  Pharmacist Clinical Goal(s):  Patient will achieve adherence to monitoring guidelines and medication adherence to achieve therapeutic efficacy achieve control of blood pressure as evidenced by home readings  through collaboration with PharmD and provider.   Interventions: 1:1 collaboration with [name], NP regarding development and update of comprehensive plan of care as evidenced by provider attestation and co-signature Inter-disciplinary care team collaboration (see longitudinal plan of care) Comprehensive medication review performed; medication list updated in electronic medical record  Hypertension (BP goal <130/80) -Uncontrolled -Current treatment: Amlodipine 57m, 1 tablet once daily - Appropriate, Query effective, Safe, Accessible Losartan 100 mg 1 tablet once daily - Appropriate, Effective, Query Safe, Accessible Metoprolol tartrate 562m 1 tablet twice daily - Appropriate, Query effective, Safe, Accessible Chlorthalidone 25 mg 1/2 tablet daily - Appropriate, Query effective, Safe, Accessible -Medications previously tried: HCTZ (switched to chlorthalidone) -Current home readings: 143/77, 151/71, 120/80, 158/80 (every few days - arm cuff) -Current dietary habits: tries to limit salt some; does enjoy eating; [place] salt  - doesn't use canned and frozen vegetables - not eating out more often -Current exercise habits: sitting down more than usual - can do sit ups, could go for a -Denies hypotensive/hypertensive symptoms -Educated on Daily salt intake goal < 2300 mg; Exercise goal of 150 minutes per week; Importance of home blood pressure monitoring; Proper BP monitoring technique; -Counseled to monitor BP at home at least once weekly, document, and provide log at future appointments -Counseled on diet and exercise extensively Recommended checking BP daily or every other day.  Hyperlipidemia: (LDL goal <  70) -Controlled -Current treatment: Atorvastatin 1082m1 tablet once daily - Appropriate, Effective, Safe, Accessible -Medications previously tried: none  -Current dietary patterns: did not discuss -Current exercise habits: did not discuss -Educated on Cholesterol goals;  Exercise goal of 150 minutes per week; -Counseled on diet and exercise extensively Recommended to continue current medication  Diabetes (A1c goal <7%) -Controlled -Current medications: Insulin glargine (Lantus Solostar), inject 10 units into skin daily - Appropriate, Effective, Query Safe, Accessible Glipizide ER 2.5 mg, 1 tablet in the morning and at bedtime (takes first thing) - Appropriate, Effective, Query Safe, Accessible -Medications previously tried: metformin (kidney function), Jardiance (kidney function) -Current home glucose readings fasting glucose: could not provide (not been > 150 in a while); lowest 60 - feels shaky post prandial glucose: not sure Before bedtime: 70s-80s -Reports hypoglycemic/hyperglycemic symptoms -Current meal patterns:  breakfast: did not discuss  lunch: did not discuss   dinner: did not discuss  snacks: did not discuss  drinks: did not discuss  -Current exercise: did not discuss -Educated on A1c and blood sugar goals; Exercise goal of 150 minutes per week; -Counseled to check feet daily and get yearly eye exams -Counseled on diet and exercise extensively Recommended to continue current medication Collaborated with PCP to get testing supplies to double check libre overnight lows.  Depression/Anxiety/Irritability (Goal: minimize symptoms) -Not ideally controlled -Current treatment: Bupropion 150 mg once daily - Appropriate, Effective, Safe, Accessible -Medications previously tried/failed: none -PHQ9: 0 -Educated on Benefits of medication for symptom control -Recommended to continue current medication  Right hemiparesis  (Goal: minimize symptoms) -Controlled -Current  treatment  Gabapentin 300m11m capsule at bedtime - Appropriate, Effective, Safe, Accessible Tizanidine 2mg,18m2 tablets at bedtime - Appropriate, Effective, Safe, Accessible -Medications previously tried: cyclobenzaprine  -Recommended to continue current medication  History of stroke (Goal: prevent future strokes) -Controlled -Current treatment  Atorvastatin  82m, 1 tablet once daily  - Appropriate, Effective, Safe, Accessible Aspirin 848m 1 tablet once daily - Appropriate, Effective, Safe, Accessible -Medications previously tried: none  -Recommended to continue current medication  Health Maintenance -Vaccine gaps: tetanus -Current therapy:  none -Educated on Cost vs benefit of each product must be carefully weighed by individual consumer -Patient is satisfied with current therapy and denies issues -Recommended to continue as is  Patient Goals/Self-Care Activities Patient will:  - take medications as prescribed check glucose with Freestyle libre, document, and provide at future appointments check blood pressure weekly, document, and provide at future appointments target a minimum of 150 minutes of moderate intensity exercise weekly  Follow Up Plan: Telephone follow up appointment with care management team member scheduled for:4 months       Medication Assistance: None required.  Patient affirms current coverage meets needs.  Compliance/Adherence/Medication fill history: Care Gaps: A1c, urine microalbumin, tetanus BP - 142/68 (06/12/22) Last A1C - 7.7 on 12/19/21  Star-Rating Drugs: Losartan 100 mg - Last filled 07/30/22 90 DS at [facility] Atorvastatin 10 mg - Last filled 07/30/22 90 DS at [facility] Glipizide 2.5 mg - Last filled 07/30/22 90 DS at [facility]  Patient's preferred pharmacy is:  [facility] [place] [address] [place]: [phone]: [phone] Uses pill  box? Yes Pt endorses 95% compliance  We discussed: Benefits of medication synchronization, packaging and delivery as well as enhanced pharmacist oversight with [facility]. Patient decided to: Utilize [facility] pharmacy for medication synchronization, packaging and delivery  Care Plan and Follow Up Patient Decision:  Patient agrees to Care Plan and Follow-up.  Plan: The care management team will reach out to the patient again over the next 60 days.  MaJeni SallesPharmD, B[facility] [place]

## 2022-08-21 ENCOUNTER — Other Ambulatory Visit: Payer: Self-pay | Admitting: Adult Health

## 2022-08-21 ENCOUNTER — Ambulatory Visit (INDEPENDENT_AMBULATORY_CARE_PROVIDER_SITE_OTHER): Payer: HMO | Admitting: Pharmacist

## 2022-08-21 DIAGNOSIS — E1140 Type 2 diabetes mellitus with diabetic neuropathy, unspecified: Secondary | ICD-10-CM

## 2022-08-21 DIAGNOSIS — I10 Essential (primary) hypertension: Secondary | ICD-10-CM

## 2022-08-21 MED ORDER — BLOOD GLUCOSE MONITOR KIT: PACK | 0 refills | Status: DC

## 2022-08-21 NOTE — Patient Instructions (Signed)
Hi Russell Austin,  It was great to catch up again! Don't forget to bring your blood pressure cuff and log to your appointment with [name] next week.  Please reach out to me if you have any questions or need anything before our follow up!  Best, Maddie  [name], PharmD, [place] Pharmacist [facility] [place]   Visit Information   Goals Addressed   None    Patient Care Plan: CCM Pharmacy Care Plan     Problem Identified: Problem: Hypertension, Hyperlipidemia, Diabetes, Chronic Kidney Disease, and Irritability and Pain      Long-Range Goal: Patient-Specific Goal   Start Date: 04/19/2021  Expected End Date: 04/19/2022  Recent Progress: On track  Priority: High  Note:   Current Barriers:  Unable to independently monitor therapeutic efficacy Unable to achieve control of blood pressure  Unable to self administer medications as prescribed  Pharmacist Clinical Goal(s):  Patient will achieve adherence to monitoring guidelines and medication adherence to achieve therapeutic efficacy achieve control of blood pressure as evidenced by home readings  through collaboration with PharmD and provider.   Interventions: 1:1 collaboration with [name], NP regarding development and update of comprehensive plan of care as evidenced by provider attestation and co-signature Inter-disciplinary care team collaboration (see longitudinal plan of care) Comprehensive medication review performed; medication list updated in electronic medical record  Hypertension (BP goal <130/80) -Uncontrolled -Current treatment: Amlodipine 40m, 1 tablet once daily - Appropriate, Query effective, Safe, Accessible Losartan 100 mg 1 tablet once daily - Appropriate, Effective, Query Safe, Accessible Metoprolol tartrate 511m 1 tablet twice daily - Appropriate, Query effective, Safe, Accessible Chlorthalidone 25 mg 1/2 tablet daily - Appropriate, Query effective, Safe,  Accessible -Medications previously tried: HCTZ (switched to chlorthalidone) -Current home readings: 143/77, 151/71, 120/80, 158/80 (every few days - arm cuff) -Current dietary habits: tries to limit salt some; does enjoy eating; HiCooterea salt  - doesn't use canned and frozen vegetables - not eating out more often -Current exercise habits: sitting down more than usual - can do sit ups, could go for a -Denies hypotensive/hypertensive symptoms -Educated on Daily salt intake goal < 2300 mg; Exercise goal of 150 minutes per week; Importance of home blood pressure monitoring; Proper BP monitoring technique; -Counseled to monitor BP at home at least once weekly, document, and provide log at future appointments -Counseled on diet and exercise extensively Recommended checking BP daily or every other day.  Hyperlipidemia: (LDL goal < 70) -Controlled -Current treatment: Atorvastatin 1036m1 tablet once daily - Appropriate, Effective, Safe, Accessible -Medications previously tried: none  -Current dietary patterns: did not discuss -Current exercise habits: did not discuss -Educated on Cholesterol goals;  Exercise goal of 150 minutes per week; -Counseled on diet and exercise extensively Recommended to continue current medication  Diabetes (A1c goal <7%) -Controlled -Current medications: Insulin glargine (Lantus Solostar), inject 10 units into skin daily - Appropriate, Effective, Query Safe, Accessible Glipizide ER 2.5 mg, 1 tablet in the morning and at bedtime (takes first thing) - Appropriate, Effective, Query Safe, Accessible -Medications previously tried: metformin (kidney function), Jardiance (kidney function) -Current home glucose readings fasting glucose: could not provide (not been > 150 in a while); lowest 60 - feels shaky post prandial glucose: not sure Before bedtime: 70s-80s -Reports hypoglycemic/hyperglycemic symptoms -Current meal patterns:  breakfast: did not discuss   lunch: did not discuss   dinner: did not discuss  snacks: did not discuss  drinks: did not discuss  -Current exercise: did not discuss -  Educated on A1c and blood sugar goals; Exercise goal of 150 minutes per week; -Counseled to check feet daily and get yearly eye exams -Counseled on diet and exercise extensively Recommended to continue current medication Collaborated with PCP to get testing supplies to double check libre overnight lows.  Depression/Anxiety/Irritability (Goal: minimize symptoms) -Not ideally controlled -Current treatment: Bupropion 150 mg once daily - Appropriate, Effective, Safe, Accessible -Medications previously tried/failed: none -PHQ9: 0 -Educated on Benefits of medication for symptom control -Recommended to continue current medication  Right hemiparesis  (Goal: minimize symptoms) -Controlled -Current treatment  Gabapentin 366m, 1 capsule at bedtime - Appropriate, Effective, Safe, Accessible Tizanidine 263m 1-2 tablets at bedtime - Appropriate, Effective, Safe, Accessible -Medications previously tried: cyclobenzaprine  -Recommended to continue current medication  History of stroke (Goal: prevent future strokes) -Controlled -Current treatment  Atorvastatin 1027m1 tablet once daily  - Appropriate, Effective, Safe, Accessible Aspirin 57m43m tablet once daily - Appropriate, Effective, Safe, Accessible -Medications previously tried: none  -Recommended to continue current medication  Health Maintenance -Vaccine gaps: tetanus -Current therapy:  none -Educated on Cost vs benefit of each product must be carefully weighed by individual consumer -Patient is satisfied with current therapy and denies issues -Recommended to continue as is  Patient Goals/Self-Care Activities Patient will:  - take medications as prescribed check glucose with Freestyle libre, document, and provide at future appointments check blood pressure weekly, document, and provide at  future appointments target a minimum of 150 minutes of moderate intensity exercise weekly  Follow Up Plan: Telephone follow up appointment with care management team member scheduled for:4 months      Patient Care Plan: RN Care Manager Plan of Care  Completed 03/23/2022   Problem Identified: Chronic Disease Management and Care Coordination Needs (DM2,HTN and HLD Resolved 03/23/2022  Priority: High     Long-Range Goal: Establish Plan of Care for Chronic Disease Management Needs (DM2,HTN and HLD) Completed 03/23/2022  Start Date: 07/17/2021  Expected End Date: 08/15/2022  Recent Progress: On track  Priority: High  Note:   RNCM case closed goals met  Current Barriers:  Knowledge Deficits related to plan of care for management of HTN, HLD, and DMII Chronic Disease Management support and education needs related to HTN, HLD, and DMII States he now has a rollator and he feels much more steady.  Denies any falls since he got his walker.  States he is  taking his Lantus insulin every day. States he is scanning his [employer] a day ranges from 70-155.   States his wife is preparing healthy meals and he is eating a snack at bedtime to avoid having a low during the night. States he has been walking about 3 days a week for about 20 minutes weather permitting States his B/Ps have been good when he goes to the doctor and he has been checking it at home about weekly.  States his last reading at home was 138/73. States he saw the back doctor and he is to have a shot in his back next week. States his chronic pain in his hand is unchanged.   RNCM Clinical Goal(s):  Patient will verbalize understanding of plan for management of HTN, HLD, and DMII as evidenced by voiced adherence to plan of care verbalize basic understanding of  HTN, HLD, and DMII disease process and self health management plan as evidenced by voiced understanding and teach back take all medications exactly as prescribed and will call  provider for medication related questions as  evidenced by dispense report and pt verbalization  attend all scheduled medical appointments:  Annual Wellness Visit 01/21/23, Podiatry 04/09/22 as evidenced by medical records demonstrate Improved adherence to prescribed treatment plan for HTN, HLD, and DMII as evidenced by readings within limits, adherence to plan of care continue to work with RN Care Manager to address care management and care coordination needs related to  HTN, HLD, and DMII as evidenced by adherence to CM Team Scheduled appointments through collaboration with RN Care manager, provider, and care team.   Interventions: 1:1 collaboration with primary care provider regarding development and update of comprehensive plan of care as evidenced by provider attestation and co-signature Inter-disciplinary care team collaboration (see longitudinal plan of care) Evaluation of current treatment plan related to  self management and patient's adherence to plan as established by provider    Falls Interventions:  (Status:  Goal Met.) Long Term Goal Provided written and verbal education re: potential causes of falls and Fall prevention strategies Advised patient of importance of notifying provider of falls Assessed for signs and symptoms of orthostatic hypotension Advised patient to discuss need for walker with provider  Reviewed to use his walker at all times. Reinforced to get up slowly    Hyperlipidemia Interventions:  (Status:  Goal Met.) Long Term Goal Medication review performed; medication list updated in electronic medical record.  Provider established cholesterol goals reviewed Counseled on importance of regular laboratory monitoring as prescribed Reviewed role and benefits of statin for ASCVD risk reduction Discussed strategies to manage statin-induced myalgias Reviewed importance of limiting foods high in cholesterol Reviewed exercise goals and target of 150 minutes per  week  Hypertension Interventions:  (Status:  Goal Met.) Long Term Goal Last practice recorded BP readings:  BP Readings from Last 3 Encounters:  02/13/22 134/78  01/11/22 140/80  01/08/22 (!) 162/84  Most recent eGFR/CrCl: No results found for: EGFR  No components found for: CRCL  Evaluation of current treatment plan related to hypertension self management and patient's adherence to plan as established by provider Provided education to patient re: stroke prevention, s/s of heart attack and stroke Reviewed medications with patient and discussed importance of compliance Discussed plans with patient for ongoing care management follow up and provided patient with direct contact information for care management team Advised patient, providing education and rationale, to monitor blood pressure daily and record, calling PCP for findings outside established parameters Provided education on prescribed diet low sodium low CHO Discussed complications of poorly controlled blood pressure such as heart disease, stroke, circulatory complications, vision complications, kidney impairment, sexual dysfunction Reinforced to check his B/P at least weekly. Reinforced to try taking his B/P after he has taken his B/P medications and to contact his provider if elevated.      Diabetes Interventions:(Status:  Goal Met.)  Long Term Goal Assessed patient's understanding of A1c goal: <7% Provided education to patient about basic DM disease process Reviewed medications with patient and discussed importance of medication adherence Counseled on importance of regular laboratory monitoring as prescribed Reviewed scheduled/upcoming provider appointments including: CCM Pharm D 02/23/22, [name] NP 03/20/22, Annual Wellness Visit 01/21/23, Podiatry 04/09/22  Advised patient, providing education and rationale, to check cbg scan 3 times a day and record, calling provider for findings outside established parameters  Reinforced to  take his Lantus insulin daily around the same time. Reinforced goals for time in range and how that effects his A1C value,  Reinforced s/sx of hypoglycemia and how to treat and importance to  having bedtime snack Lab Results  Component Value Date   HGBA1C 7.7 (H) 12/19/2021  Pain Interventions:  (Status:  Goal Met.) Long Term Goal Pain assessment performed Medications reviewed Reviewed provider established plan for pain management Discussed importance of adherence to all scheduled medical appointments Counseled on the importance of reporting any/all new or changed pain symptoms or management strategies to pain management provider Discussed use of relaxation techniques and/or diversional activities to assist with pain reduction (distraction, imagery, relaxation, massage, acupressure, TENS, heat, and cold application Reviewed with patient prescribed pharmacological and nonpharmacological pain relief strategies  Reviewed to keep appointment for back injections Reinforced to try to change position at night when sleeping and to try to use pillows to help him stay in position    Patient Goals/Self-Care Activities: Take all medications as prescribed Attend all scheduled provider appointments Call pharmacy for medication refills 3-7 days in advance of running out of medications Call provider office for new concerns or questions  keep appointment with eye doctor check blood sugar at prescribed times: three times daily and when you have symptoms of low or high blood sugar check feet daily for cuts, sores or redness trim toenails straight across drink 6 to 8 glasses of water each day fill half of plate with vegetables limit fast food meals to no more than 1 per week manage portion size switch to sugar-free drinks keep feet up while sitting check blood pressure daily choose a place to take my blood pressure (home, clinic or office, retail store) call doctor for signs and symptoms of high blood  pressure keep all doctor appointments take medications for blood pressure exactly as prescribed eat more whole grains, fruits and vegetables, lean meats and healthy fats limit salt intake to 23101m/day call for medicine refill 2 or 3 days before it runs out take all medications exactly as prescribed call doctor with any symptoms you believe are related to your medicine Follow Up Plan:  The patient has been provided with contact information for the care management team and has been advised to call with any health related questions or concerns.  No further follow up required: RNCM case closed goals met         Patient verbalizes understanding of instructions and care plan provided today and agrees to view in [facility] status and patient understanding of how to access instructions and care plan via MyChart confirmed with patient.    Telephone follow up appointment with pharmacy team member scheduled for: 4 months  MViona Gilmore [facility]

## 2022-08-22 ENCOUNTER — Telehealth: Payer: HMO

## 2022-08-30 ENCOUNTER — Encounter: Payer: Self-pay | Admitting: Adult Health

## 2022-08-30 ENCOUNTER — Ambulatory Visit (INDEPENDENT_AMBULATORY_CARE_PROVIDER_SITE_OTHER): Payer: HMO | Admitting: Adult Health

## 2022-08-30 VITALS — BP 130/74 | HR 65 | Temp 98.0°F

## 2022-08-30 DIAGNOSIS — E1140 Type 2 diabetes mellitus with diabetic neuropathy, unspecified: Secondary | ICD-10-CM | POA: Diagnosis not present

## 2022-08-30 DIAGNOSIS — I10 Essential (primary) hypertension: Secondary | ICD-10-CM | POA: Diagnosis not present

## 2022-08-30 DIAGNOSIS — Z794 Long term (current) use of insulin: Secondary | ICD-10-CM

## 2022-08-30 LAB — POCT GLYCOSYLATED HEMOGLOBIN (HGB A1C): Hemoglobin A1C: 7.2 % — AB (ref 4.0–5.6)

## 2022-08-30 LAB — MICROALBUMIN / CREATININE URINE RATIO
Creatinine,U: 63.2 mg/dL
Microalb Creat Ratio: 94.1 mg/g — ABNORMAL HIGH (ref 0.0–30.0)
Microalb, Ur: 59.4 mg/dL — ABNORMAL HIGH (ref 0.0–1.9)

## 2022-08-30 NOTE — Progress Notes (Signed)
Subjective:    Patient ID: Russell Austin, male    DOB: 02-16-1951, 71 y.o.   MRN: [id number]  HPI He presents to the office today for follow-up regarding DM II and HTN   DM II -he is currently managed with Lantus 10 units daily and glipizide 2.5 mg twice daily.  He does use the libre sensor to manage his blood sugars with readings.  Does lead a pretty sedentary lifestyle, most of the day he has been sitting at this point. He is using his Elenor Legato and reports that he has had some low blood sugars in the 60's, over night he does not take this insulin when his blood sugars are low. He may be taking his insulin 3 days a week.  Lab Results  Component Value Date   HGBA1C 7.7 (H) 12/19/2021   Hypertension-managed with Norvasc 10 mg daily, losartan 100 mg daily, chlorthalidone 12.5 mg daily, and metoprolol 50 mg twice daily.  He denies dizziness, lightheadedness, chest pain, shortness of breath, or syncopal episodes.  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   Diabetes mellitus type II    Diabetic retinopathy FOLLOWED BY DR Zadie Rhine   ED (erectile dysfunction)    Glaucoma of both eyes    Hypertension    Left hydrocele    Stroke [facility])     Social History   Socioeconomic History   Marital status: Married    Spouse name: Not on file   Number of children: 1   Years of education: 12   Highest education level: 12th grade  Occupational History   Occupation: Disabled  Tobacco Use   Smoking status: Never   Smokeless tobacco: Never  Substance and Sexual Activity   Alcohol use: No   Drug use: No   Sexual activity: Not on file  Other Topics Concern   Not on file  Social History Narrative   Lives at home with his  wife and granddaughter.   Left-handed.   3 cups caffeine per day.   Social Determinants of Health   Financial Resource Strain: Low Risk  (08/21/2022)   Overall Financial Resource Strain (CARDIA)    Difficulty of Paying Living Expenses: Not very hard  Food Insecurity: No Food Insecurity (06/11/2022)   Hunger Vital Sign    Worried About Running Out of Food in the Last Year: Never true    Ran Out of Food in the Last Year: Never true  Transportation Needs: No Transportation Needs (06/11/2022)   PRAPARE - Hydrologist (Medical): No    Lack of Transportation (Non-Medical): No  Physical Activity: Inactive (06/11/2022)   Exercise Vital Sign    Days of Exercise per Week: 0 days    Minutes of Exercise per Session: 0 min  Stress: Stress Concern Present (06/11/2022)   Andrew    Feeling of Stress : To some extent  Social Connections: Socially Integrated (06/11/2022)   Social Connection and Isolation Panel [NHANES]    Frequency of Communication with Friends and Family: More than three times a week    Frequency of Social Gatherings with Friends and Family: Once a  week    Attends Religious Services: 1 to 4 times per year    Active Member of Clubs or Organizations: Yes    Attends Archivist Meetings: 1 to 4 times per year    Marital Status: Married  Human resources officer Violence: Not At Risk (01/17/2022)   Humiliation, Afraid, Rape, and Kick questionnaire    Fear of Current or Ex-Partner: No    Emotionally Abused: No    Physically Abused: No    Sexually Abused: No    Past Surgical History:  Procedure Laterality Date   APPENDECTOMY  AGE EARLY 20'S   COLONOSCOPY  12/30/2020   every 5 years   HYDROCELE EXCISION  03/31/2012   Procedure: HYDROCELECTOMY ADULT;  Surgeon: Franchot Gallo, MD;  Location: [facility];  Service: Urology;  Laterality: Left;  63 MINS     LEFT EYE LASER  RETINA REPAIR  SEPT 2012   RIGHT EYE VITRECTOMY/ INSERTION GLAUCOMA SETON/ LASER REPAIR  12-13-2008   RETINAL ARTERY OCCLUSION /NEOVASCULAR GLAUCOMA/ HEMORRHAGE   RIGHT EYE VITRETOMY/ INSERTION GLAUCOMA SETON X2/ LASER  03-24-2009   RECURRENT HEMORRHAGE/ OCCLUSION INTERNAL SETON   SHOULDER ARTHROSCOPY Right 2005   undescended right testicle removed  1994    Family History  Problem Relation Age of Onset   Glaucoma Mother    Diabetes Mother    Stomach cancer Maternal Grandfather    Stroke Maternal Grandmother     Allergies  Allergen Reactions   Lactose Intolerance (Gi) Diarrhea   Apple Pectin [Pectin] Itching    ITCHY THROAT   Lactose Other (See Comments)   Metformin And Related     D/t decreased kidney function    Peach [Prunus Persica] Itching    ITCHY THROAT   Morphine And Related Anxiety    Jittery    Current Outpatient Medications on File Prior to Visit  Medication Sig Dispense Refill   amLODipine (NORVASC) 10 MG tablet Take 1 tablet (10 mg total) by mouth daily. (Patient taking differently: Take 5 mg by mouth daily.) 90 tablet 2   aspirin EC 81 MG tablet Take 81 mg by mouth daily.     atorvastatin (LIPITOR) 10 MG tablet TAKE ONE TABLET BY MOUTH EVERY MORNING 90 tablet 3   blood glucose meter kit and supplies KIT Dispense based on patient and insurance preference. Use up to four times daily as directed. 1 each 0   buPROPion (WELLBUTRIN XL) 300 MG 24 hr tablet Take 1 tablet (300 mg total) by mouth daily. 90 tablet 1   chlorthalidone (HYGROTON) 25 MG tablet Take 25 mg by mouth daily.     Continuous Blood Gluc Receiver (FREESTYLE LIBRE 14 DAY READER) DEVI Use with freestyle libre system 1 each 0   Continuous Blood Gluc Sensor (FREESTYLE LIBRE 3 SENSOR) MISC USE TO check blood glucose AS DIRECTED AND CHANGE sensor every 14 DAYS     gabapentin (NEURONTIN) 300 MG capsule TAKE ONE CAPSULE BY MOUTH EVERYDAY AT BEDTIME 90 capsule 3   glipiZIDE (GLUCOTROL XL) 2.5 MG 24 hr tablet  TAKE ONE TABLET BY MOUTH EVERY MORNING and TAKE ONE TABLET BY MOUTH EVERY EVENING 180 tablet 3   Insulin Pen Needle (B-D ULTRAFINE III SHORT PEN) 31G X 8 MM MISC USE WITH LANUTS PEN 100 each 3   Lancets (ONETOUCH DELICA PLUS [id number]) MISC Apply topically.     LANTUS SOLOSTAR 100 UNIT/ML Solostar Pen Inject 10 units into THE SKIN daily 9 mL 0   losartan (COZAAR) 100 MG  tablet TAKE ONE TABLET BY MOUTH EVERY MORNING 90 tablet 1   metoprolol tartrate (LOPRESSOR) 50 MG tablet TAKE ONE TABLET BY MOUTH EVERY MORNING and TAKE ONE TABLET BY MOUTH EVERYDAY AT BEDTIME 180 tablet 3   ONETOUCH ULTRA test strip USE TO check blood glucose UP TO four times daily AS DIRECTED     tiZANidine (ZANAFLEX) 2 MG tablet TAKE ONE TABLET BY MOUTH EVERYDAY AT BEDTIME 90 tablet 3   torsemide (DEMADEX) 20 MG tablet Take 20 mg by mouth daily.     No current facility-administered medications on file prior to visit.    BP 130/74   Pulse 65   Temp 98 F (36.7 C) (Oral)   SpO2 99%       Objective:   Physical Exam Vitals and nursing note reviewed.  Constitutional:      Appearance: Normal appearance.  Cardiovascular:     Rate and Rhythm: Normal rate and regular rhythm.     Pulses: Normal pulses.     Heart sounds: Normal heart sounds.  Pulmonary:     Effort: Pulmonary effort is normal.     Breath sounds: Normal breath sounds.  Musculoskeletal:        General: Normal range of motion.  Skin:    General: Skin is warm and dry.  Neurological:     General: No focal deficit present.     Mental Status: He is alert and oriented to person, place, and time.  Psychiatric:        Mood and Affect: Mood normal.        Behavior: Behavior normal.        Thought Content: Thought content normal.        Judgment: Judgment normal.        Assessment & Plan:  1. Type 2 diabetes mellitus with diabetic neuropathy, with long-term current use of insulin ([place]) - Will d/c evening glipizide  - Let me know if he continues to have  overnight lows  - Continue with Lantus 10 units QHS - POC HgB A1c- 7.2 .  - Microalbumin/Creatinine Ratio, Urine; Future  2. Essential hypertension - Controlled. No change in medication.   [name], NP

## 2022-08-30 NOTE — Patient Instructions (Addendum)
-   Stop glipizide at night  - Continue glipizide in the morning and continue lantus 10 units in the evening   Follow up in 3 months

## 2022-09-02 DIAGNOSIS — I10 Essential (primary) hypertension: Secondary | ICD-10-CM

## 2022-09-02 DIAGNOSIS — Z794 Long term (current) use of insulin: Secondary | ICD-10-CM

## 2022-09-02 DIAGNOSIS — E1140 Type 2 diabetes mellitus with diabetic neuropathy, unspecified: Secondary | ICD-10-CM

## 2022-09-10 ENCOUNTER — Other Ambulatory Visit: Payer: PPO

## 2022-09-18 DIAGNOSIS — N184 Chronic kidney disease, stage 4 (severe): Secondary | ICD-10-CM | POA: Diagnosis not present

## 2022-09-18 NOTE — Progress Notes (Deleted)
Patient presents to the office today for diabetic shoe and insole measuring.  Patient was measured with brannock device to determine size and width for 1 pair of extra depth shoes and foam casted for 3 pair of insoles.   ABN signed.   Documentation of medical necessity will be sent to patient's treating diabetic doctor to verify and sign.   Patient's diabetic provider: ***  Shoes and insoles will be ordered at that time and patient will be notified for an appointment for fitting when they arrive.   Brannock measurement: ***  Patient shoe selection-   1st   Shoe choice:   ***  2nd  Shoe choice:   ***  Shoe size ordered: ***

## 2022-09-20 ENCOUNTER — Other Ambulatory Visit: Payer: HMO

## 2022-09-20 DIAGNOSIS — M2141 Flat foot [pes planus] (acquired), right foot: Secondary | ICD-10-CM

## 2022-09-20 DIAGNOSIS — E1140 Type 2 diabetes mellitus with diabetic neuropathy, unspecified: Secondary | ICD-10-CM

## 2022-09-20 DIAGNOSIS — M2170 Unequal limb length (acquired), unspecified site: Secondary | ICD-10-CM

## 2022-09-20 DIAGNOSIS — M2012 Hallux valgus (acquired), left foot: Secondary | ICD-10-CM

## 2022-09-25 DIAGNOSIS — E785 Hyperlipidemia, unspecified: Secondary | ICD-10-CM | POA: Diagnosis not present

## 2022-09-25 DIAGNOSIS — R600 Localized edema: Secondary | ICD-10-CM | POA: Diagnosis not present

## 2022-09-25 DIAGNOSIS — N184 Chronic kidney disease, stage 4 (severe): Secondary | ICD-10-CM | POA: Diagnosis not present

## 2022-09-25 DIAGNOSIS — I129 Hypertensive chronic kidney disease with stage 1 through stage 4 chronic kidney disease, or unspecified chronic kidney disease: Secondary | ICD-10-CM | POA: Diagnosis not present

## 2022-09-25 DIAGNOSIS — E1122 Type 2 diabetes mellitus with diabetic chronic kidney disease: Secondary | ICD-10-CM | POA: Diagnosis not present

## 2022-09-25 DIAGNOSIS — N2581 Secondary hyperparathyroidism of renal origin: Secondary | ICD-10-CM | POA: Diagnosis not present

## 2022-09-25 DIAGNOSIS — D631 Anemia in chronic kidney disease: Secondary | ICD-10-CM | POA: Diagnosis not present

## 2022-10-29 ENCOUNTER — Telehealth: Payer: Self-pay | Admitting: Adult Health

## 2022-10-29 NOTE — Telephone Encounter (Signed)
Patient requesting referral for physical therapy after falling and cracking his rib.

## 2022-10-31 NOTE — Telephone Encounter (Signed)
Tried to call pt for the past 2 days no answering going to vm. Lm advising pt to return call.

## 2022-11-01 NOTE — Telephone Encounter (Signed)
Left message to return phone call. Closing note.

## 2022-11-05 DIAGNOSIS — N184 Chronic kidney disease, stage 4 (severe): Secondary | ICD-10-CM | POA: Diagnosis not present

## 2022-11-05 DIAGNOSIS — N189 Chronic kidney disease, unspecified: Secondary | ICD-10-CM | POA: Diagnosis not present

## 2022-11-09 DIAGNOSIS — I639 Cerebral infarction, unspecified: Secondary | ICD-10-CM | POA: Diagnosis not present

## 2022-11-09 DIAGNOSIS — N2581 Secondary hyperparathyroidism of renal origin: Secondary | ICD-10-CM | POA: Diagnosis not present

## 2022-11-09 DIAGNOSIS — N184 Chronic kidney disease, stage 4 (severe): Secondary | ICD-10-CM | POA: Diagnosis not present

## 2022-11-09 DIAGNOSIS — E1122 Type 2 diabetes mellitus with diabetic chronic kidney disease: Secondary | ICD-10-CM | POA: Diagnosis not present

## 2022-11-09 DIAGNOSIS — I129 Hypertensive chronic kidney disease with stage 1 through stage 4 chronic kidney disease, or unspecified chronic kidney disease: Secondary | ICD-10-CM | POA: Diagnosis not present

## 2022-11-09 DIAGNOSIS — D631 Anemia in chronic kidney disease: Secondary | ICD-10-CM | POA: Diagnosis not present

## 2022-11-16 DIAGNOSIS — E113312 Type 2 diabetes mellitus with moderate nonproliferative diabetic retinopathy with macular edema, left eye: Secondary | ICD-10-CM | POA: Diagnosis not present

## 2022-11-16 DIAGNOSIS — H40012 Open angle with borderline findings, low risk, left eye: Secondary | ICD-10-CM | POA: Diagnosis not present

## 2022-11-16 LAB — HM DIABETES EYE EXAM

## 2022-11-29 ENCOUNTER — Ambulatory Visit (INDEPENDENT_AMBULATORY_CARE_PROVIDER_SITE_OTHER): Payer: PPO | Admitting: Adult Health

## 2022-11-29 ENCOUNTER — Encounter: Payer: Self-pay | Admitting: Adult Health

## 2022-11-29 VITALS — BP 170/100 | HR 67 | Temp 97.7°F | Ht 69.0 in | Wt 174.0 lb

## 2022-11-29 DIAGNOSIS — E1140 Type 2 diabetes mellitus with diabetic neuropathy, unspecified: Secondary | ICD-10-CM

## 2022-11-29 DIAGNOSIS — I10 Essential (primary) hypertension: Secondary | ICD-10-CM

## 2022-11-29 DIAGNOSIS — Z794 Long term (current) use of insulin: Secondary | ICD-10-CM

## 2022-11-29 LAB — POCT GLYCOSYLATED HEMOGLOBIN (HGB A1C): Hemoglobin A1C: 7.5 % — AB (ref 4.0–5.6)

## 2022-11-29 MED ORDER — LANTUS SOLOSTAR 100 UNIT/ML ~~LOC~~ SOPN: 10.0000 [IU] | PEN_INJECTOR | Freq: Every day | SUBCUTANEOUS | 0 refills | Status: DC

## 2022-11-29 NOTE — Progress Notes (Signed)
Subjective:    Patient ID: Russell Austin, male    DOB: [birth year], 72 y.o.   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  DM II -he is currently managed with Lantus 10 units daily and glipizide 2.5 mg daily.  He does use the libre sensor to manage his blood sugars with readings.  Does lead a pretty sedentary lifestyle.  During his last visit he was reporting blood sugar readings in the 60s overnight.  He was not taking his insulin when his blood sugars were this low.  We decided to use his glipizide from twice daily dosing to daily dosing in the morning.   He reports that he is still having some infrequent lows but also blood sugars as high as 250. He is not taking his insulin every day, he only takes it when his blood sugars are " low" and then it goes high he will sometimes take an extra dose.  Lab Results  Component Value Date   HGBA1C 7.2 (A) 08/30/2022    Hypertension-managed with Norvasc 10 mg daily, losartan 100 mg daily, chlorthalidone 12.5 mg daily, and metoprolol 50 mg twice daily.  He denies dizziness, lightheadedness, chest pain, shortness of breath, or syncopal episodes.  He does monitor his blood pressures at home with readings in the 130s to 140s over 70s to 80s. 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   Diabetes mellitus type II    Diabetic retinopathy FOLLOWED BY DR Zadie Rhine   ED (erectile dysfunction)    Glaucoma of both eyes    Hypertension    Left hydrocele    Stroke [facility])      Social History   Socioeconomic History   Marital status: Married    Spouse name: Not on file   Number of children: 1   Years of education: 12   Highest education level: 12th grade  Occupational History   Occupation: Disabled  Tobacco Use   Smoking status: Never   Smokeless tobacco: Never  Substance and Sexual Activity   Alcohol use: No   Drug use: No   Sexual activity: Not on file  Other Topics Concern   Not on file  Social History Narrative   Lives at home with his wife and granddaughter.   Left-handed.   3 cups caffeine per day.   Social Determinants of Health   Financial Resource Strain: Low Risk  (08/21/2022)   Overall Financial Resource Strain (CARDIA)    Difficulty of Paying Living Expenses: Not very hard  Food Insecurity: No Food Insecurity (06/11/2022)   Hunger Vital Sign    Worried About Running Out of Food in the Last Year: Never true    Ran Out of Food in the Last Year: Never true  Transportation Needs: No Transportation Needs (06/11/2022)   PRAPARE - Hydrologist (Medical): No    Lack of Transportation (Non-Medical): No  Physical Activity: Inactive (06/11/2022)   Exercise Vital Sign    Days of Exercise per Week:  0 days    Minutes of Exercise per Session: 0 min  Stress: Stress Concern Present (06/11/2022)   [place]    Feeling of Stress : To some extent  Social Connections: Socially Integrated (06/11/2022)   Social Connection and Isolation Panel [NHANES]    Frequency of Communication with Friends and Family: More than three times a week    Frequency of Social Gatherings with Friends and Family: Once a week    Attends Religious Services: 1 to 4 times per year    Active Member of [employer] or Organizations: Yes    Attends Archivist Meetings: 1 to 4 times per year    Marital Status: Married  Human resources officer Violence: Not At Risk (01/17/2022)    Humiliation, Afraid, Rape, and Kick questionnaire    Fear of Current or Ex-Partner: No    Emotionally Abused: No    Physically Abused: No    Sexually Abused: No    Past Surgical History:  Procedure Laterality Date   APPENDECTOMY  AGE EARLY 20'S   COLONOSCOPY  12/30/2020   every 5 years   HYDROCELE EXCISION  03/31/2012   Procedure: HYDROCELECTOMY ADULT;  Surgeon: Franchot Gallo, MD;  Location: [facility];  Service: Urology;  Laterality: Left;  52 MINS     LEFT EYE LASER RETINA REPAIR  SEPT 2012   RIGHT EYE VITRECTOMY/ INSERTION GLAUCOMA SETON/ LASER REPAIR  12-13-2008   RETINAL ARTERY OCCLUSION /NEOVASCULAR GLAUCOMA/ HEMORRHAGE   RIGHT EYE VITRETOMY/ INSERTION GLAUCOMA SETON X2/ LASER  03-24-2009   RECURRENT HEMORRHAGE/ OCCLUSION INTERNAL SETON   SHOULDER ARTHROSCOPY Right 2005   undescended right testicle removed  1994    Family History  Problem Relation Age of Onset   Glaucoma Mother    Diabetes Mother    Stomach cancer Maternal Grandfather    Stroke Maternal Grandmother     Allergies  Allergen Reactions   Lactose Intolerance (Gi) Diarrhea   Apple Pectin [Pectin] Itching    ITCHY THROAT   Lactose Other (See Comments)   Metformin And Related     D/t decreased kidney function    Peach [Prunus Persica] Itching    ITCHY THROAT   Morphine And Related Anxiety    Jittery    Current Outpatient Medications on File Prior to Visit  Medication Sig Dispense Refill   amLODipine (NORVASC) 10 MG tablet Take 1 tablet (10 mg total) by mouth daily. (Patient taking differently: Take 5 mg by mouth daily.) 90 tablet 2   aspirin EC 81 MG tablet Take 81 mg by mouth daily.     atorvastatin (LIPITOR) 10 MG tablet TAKE ONE TABLET BY MOUTH EVERY MORNING 90 tablet 3   blood glucose meter kit and supplies KIT Dispense based on patient and insurance preference. Use up to four times daily as directed. 1 each 0   buPROPion (WELLBUTRIN XL) 300 MG 24 hr tablet Take 1 tablet (300  mg total) by mouth daily. 90 tablet 1   chlorthalidone (HYGROTON) 25 MG tablet Take 25 mg by mouth daily.     Continuous Blood Gluc Receiver (FREESTYLE LIBRE 14 DAY READER) DEVI Use with freestyle libre system 1 each 0   Continuous Blood Gluc Sensor (FREESTYLE LIBRE 3 SENSOR) MISC USE TO check blood glucose AS DIRECTED AND CHANGE sensor every 14 DAYS     gabapentin (NEURONTIN) 300 MG capsule TAKE ONE CAPSULE BY MOUTH EVERYDAY AT BEDTIME 90 capsule 3   glipiZIDE (GLUCOTROL  XL) 2.5 MG 24 hr tablet TAKE ONE TABLET BY MOUTH EVERY MORNING and TAKE ONE TABLET BY MOUTH EVERY EVENING (Patient taking differently: Take 2.5 mg by mouth daily with breakfast.) 180 tablet 3   Insulin Pen Needle (B-D ULTRAFINE III SHORT PEN) 31G X 8 MM MISC USE WITH LANUTS PEN 100 each 3   Lancets (ONETOUCH DELICA PLUS 123XX123) MISC Apply topically.     losartan (COZAAR) 100 MG tablet TAKE ONE TABLET BY MOUTH EVERY MORNING 90 tablet 1   metoprolol tartrate (LOPRESSOR) 50 MG tablet TAKE ONE TABLET BY MOUTH EVERY MORNING and TAKE ONE TABLET BY MOUTH EVERYDAY AT BEDTIME 180 tablet 3   ONETOUCH ULTRA test strip USE TO check blood glucose UP TO four times daily AS DIRECTED     tiZANidine (ZANAFLEX) 2 MG tablet TAKE ONE TABLET BY MOUTH EVERYDAY AT BEDTIME 90 tablet 3   torsemide (DEMADEX) 20 MG tablet Take 20 mg by mouth daily.     LANTUS SOLOSTAR 100 UNIT/ML Solostar Pen Inject 10 units into THE SKIN daily 9 mL 0   No current facility-administered medications on file prior to visit.    BP (!) 170/100   Pulse 67   Temp 97.7 F (36.5 C) (Oral)   Ht 5\' 9"  (1.753 m)   Wt 174 lb (78.9 kg)   SpO2 99%   BMI 25.70 kg/m       Objective:   Physical Exam Vitals and nursing note reviewed.  Constitutional:      Appearance: Normal appearance.  Cardiovascular:     Rate and Rhythm: Normal rate and regular rhythm.     Pulses: Normal pulses.     Heart sounds: Normal heart sounds.  Pulmonary:     Effort: Pulmonary effort is  normal.     Breath sounds: Normal breath sounds.  Skin:    General: Skin is warm and dry.  Neurological:     General: No focal deficit present.     Mental Status: He is alert and oriented to person, place, and time.  Psychiatric:        Mood and Affect: Mood normal.        Behavior: Behavior normal.        Thought Content: Thought content normal.        Judgment: Judgment normal.        Assessment & Plan:  1. Type 2 diabetes mellitus with diabetic neuropathy, with long-term current use of insulin (HCC)  - POC HgB A1c- 7.5  - educated patient on how is insulin is long acting. I want him taking his insulin one time a day in the morning and not to take any extra doses. I think the extra doses are causing him to go low. Continue with glipizide daily  - Follow up in three months  - insulin glargine (LANTUS SOLOSTAR) 100 UNIT/ML Solostar Pen; Inject 10 Units into the skin daily.  Dispense: 9 mL; Refill: 0  2. Essential hypertension - Not at goal today due to not taking his medication   [name], NP

## 2022-11-29 NOTE — Patient Instructions (Signed)
Your A1c was 7.5  Please take your insuline every day   Follow up in 3 months

## 2022-12-03 ENCOUNTER — Ambulatory Visit (INDEPENDENT_AMBULATORY_CARE_PROVIDER_SITE_OTHER): Payer: HMO

## 2022-12-03 DIAGNOSIS — E1140 Type 2 diabetes mellitus with diabetic neuropathy, unspecified: Secondary | ICD-10-CM

## 2022-12-03 DIAGNOSIS — M2011 Hallux valgus (acquired), right foot: Secondary | ICD-10-CM

## 2022-12-03 DIAGNOSIS — Z794 Long term (current) use of insulin: Secondary | ICD-10-CM

## 2022-12-03 DIAGNOSIS — M2141 Flat foot [pes planus] (acquired), right foot: Secondary | ICD-10-CM

## 2022-12-03 DIAGNOSIS — M2012 Hallux valgus (acquired), left foot: Secondary | ICD-10-CM

## 2022-12-03 DIAGNOSIS — M2142 Flat foot [pes planus] (acquired), left foot: Secondary | ICD-10-CM

## 2022-12-03 NOTE — Progress Notes (Signed)
Patient presents to the office today for diabetic shoe and insole measuring.  Patient was measured with brannock device to determine size and width for 1 pair of extra depth shoes and foam casted for 3 pair of insoles.   ABN signed.   Documentation of medical necessity will be sent to patient's treating diabetic doctor to verify and sign.   Patient's diabetic provider: [name], NP   Shoes and insoles will be ordered at that time and patient will be notified for an appointment for fitting when they arrive.   Brannock measurement: 9 W  Patient shoe selection-   1st   Shoe choice:   [id number]  Shoe size ordered: 9 W

## 2022-12-09 ENCOUNTER — Other Ambulatory Visit (INDEPENDENT_AMBULATORY_CARE_PROVIDER_SITE_OTHER): Payer: HMO | Admitting: Podiatry

## 2022-12-09 DIAGNOSIS — M2141 Flat foot [pes planus] (acquired), right foot: Secondary | ICD-10-CM

## 2022-12-09 DIAGNOSIS — M2012 Hallux valgus (acquired), left foot: Secondary | ICD-10-CM

## 2022-12-09 DIAGNOSIS — Z794 Long term (current) use of insulin: Secondary | ICD-10-CM

## 2022-12-09 DIAGNOSIS — E1140 Type 2 diabetes mellitus with diabetic neuropathy, unspecified: Secondary | ICD-10-CM

## 2022-12-09 DIAGNOSIS — M2142 Flat foot [pes planus] (acquired), left foot: Secondary | ICD-10-CM

## 2022-12-09 DIAGNOSIS — M2011 Hallux valgus (acquired), right foot: Secondary | ICD-10-CM

## 2022-12-09 NOTE — Progress Notes (Signed)
1. Type 2 diabetes mellitus with diabetic neuropathy, with long-term current use of insulin   2. Hallux valgus, acquired, bilateral   3. Pes planus of both feet    Orders Placed This Encounter  Procedures   For Home Use Only DME Diabetic Shoe    Dispense one pair extra depth shoes with 3 pair total contact insoles. Foot deformity HAV with bunion b/l and pes planus b/l.

## 2022-12-17 ENCOUNTER — Ambulatory Visit (INDEPENDENT_AMBULATORY_CARE_PROVIDER_SITE_OTHER): Payer: PPO | Admitting: Podiatry

## 2022-12-17 ENCOUNTER — Encounter: Payer: Self-pay | Admitting: Podiatry

## 2022-12-17 DIAGNOSIS — B351 Tinea unguium: Secondary | ICD-10-CM

## 2022-12-17 DIAGNOSIS — M21612 Bunion of left foot: Secondary | ICD-10-CM

## 2022-12-17 DIAGNOSIS — M2042 Other hammer toe(s) (acquired), left foot: Secondary | ICD-10-CM

## 2022-12-17 DIAGNOSIS — M79674 Pain in right toe(s): Secondary | ICD-10-CM | POA: Diagnosis not present

## 2022-12-17 DIAGNOSIS — L84 Corns and callosities: Secondary | ICD-10-CM

## 2022-12-17 DIAGNOSIS — Z794 Long term (current) use of insulin: Secondary | ICD-10-CM | POA: Diagnosis not present

## 2022-12-17 DIAGNOSIS — M2041 Other hammer toe(s) (acquired), right foot: Secondary | ICD-10-CM

## 2022-12-17 DIAGNOSIS — E1140 Type 2 diabetes mellitus with diabetic neuropathy, unspecified: Secondary | ICD-10-CM

## 2022-12-17 DIAGNOSIS — M2141 Flat foot [pes planus] (acquired), right foot: Secondary | ICD-10-CM

## 2022-12-17 DIAGNOSIS — M79675 Pain in left toe(s): Secondary | ICD-10-CM | POA: Diagnosis not present

## 2022-12-17 DIAGNOSIS — M21611 Bunion of right foot: Secondary | ICD-10-CM

## 2022-12-17 DIAGNOSIS — M2142 Flat foot [pes planus] (acquired), left foot: Secondary | ICD-10-CM

## 2022-12-17 NOTE — Progress Notes (Unsigned)
Care Management & Coordination Services Pharmacy Note  12/17/2022 Name:  Russell Austin MRN:  [id number] DOB:  July 26, 1951  Summary: BP not at goal <140/90, not checking at home A1C not at goal <7  Recommendations/Changes made from today's visit: -Check BP at home 2-3x/week and keep a log. Bring machine into next OV to assess accuracy -Counseled to be mindful of salt content in food and to not add salt to food -Counseled on carbohydrate intake and importance of regular meal structure to avoid major sugar swings -Reminded of 02/2023 appt with PCP, if A1C still elevated, would like to consider addition of Ozempic 0.25mg  once weekly  Follow up plan: BP call in 2 weeks DM call in July Pharmacist visit in 4 months   Subjective: Russell Austin is an 72 y.o. year old male who is a primary patient of Shirline Frees, NP.  The care coordination team was consulted for assistance with disease management and care coordination needs.    Engaged with patient by telephone for follow up visit.  Recent office visits: 11/29/22 Shirline Frees, NP - For T2DM - Increase Torsemide to BID  08/30/22 Shirline Frees, NP - For T2DM - Stop Symbicort, stop evening dose of glipizide  Recent consult visits: 12/17/22 Louann Sjogren, DPM - For pain due ot onychomycosis of toenails - diabetic shoes ordered  10/17/22 Louie Bun, MD (Transplant) - No other visit details available  Hospital visits: None in previous 6 months   Objective:  Lab Results  Component Value Date   CREATININE 3.52 (H) 12/19/2021   BUN 44 (H) 12/19/2021   GFR 16.87 (L) 12/19/2021   GFRNONAA 41 (L) 10/28/2018   GFRAA 47 (L) 10/28/2018   NA 140 12/19/2021   K 4.9 12/19/2021   CALCIUM 9.0 12/19/2021   CO2 25 12/19/2021   GLUCOSE 170 (H) 12/19/2021    Lab Results  Component Value Date/Time   HGBA1C 7.5 (A) 11/29/2022 07:16 AM   HGBA1C 7.2 (A) 08/30/2022 07:20 AM   HGBA1C 7.7 (H) 12/19/2021 07:41 AM   HGBA1C 7.1 (H)  12/09/2020 08:04 AM   HGBA1C 7.3 (A) 02/11/2020 07:09 AM   HGBA1C 6.6 07/23/2018 09:00 AM   GFR 16.87 (L) 12/19/2021 07:41 AM   GFR 15.40 (L) 12/09/2020 08:04 AM   MICROALBUR 59.4 (H) 08/30/2022 07:42 AM   MICROALBUR 50.8 (H) 12/13/2014 08:01 AM    Last diabetic Eye exam:  Lab Results  Component Value Date/Time   HMDIABEYEEXA Retinopathy (A) 11/16/2022 08:52 AM    Last diabetic Foot exam:  Lab Results  Component Value Date/Time   HMDIABFOOTEX done 02/02/2010 12:00 AM     Lab Results  Component Value Date   CHOL 118 12/19/2021   HDL 31.60 (L) 12/19/2021   LDLCALC 62 12/19/2021   TRIG 123.0 12/19/2021   CHOLHDL 4 12/19/2021       Latest Ref Rng & Units 12/19/2021    7:41 AM 12/09/2020    8:04 AM 11/11/2019    7:44 AM  Hepatic Function  Total Protein 6.0 - 8.3 g/dL 6.5  6.3  5.8   Albumin 3.5 - 5.2 g/dL 3.7  3.7  3.4   AST 0 - 37 U/L ALT 0 - 53 U/L Alk Phosphatase 39 - 117 U/L 145  135  110   Total Bilirubin 0.2 - 1.2 mg/dL 0.9  1.0  1.0     Lab Results  Component Value Date/Time  TSH 4.86 12/19/2021 07:41 AM   TSH 2.03 12/09/2020 08:04 AM       Latest Ref Rng & Units 12/19/2021    7:41 AM 12/09/2020    8:04 AM 11/11/2019    7:44 AM  CBC  WBC 4.0 - 10.5 K/uL 4.0  3.7  4.2   Hemoglobin 13.0 - 17.0 g/dL 16.1  09.6  04.5   Hematocrit 39.0 - 52.0 % 35.4  37.0  34.9   Platelets 150.0 - 400.0 K/uL 248.0  258.0  270.0     Lab Results  Component Value Date/Time   VITAMINB12 495 03/27/2017 07:30 AM    Clinical ASCVD: Yes  The ASCVD Risk score (Arnett DK, et al., 2019) failed to calculate for the following reasons:   The patient has a prior MI or stroke diagnosis       11/29/2022    7:12 AM 08/30/2022    7:52 AM 04/12/2022   10:09 AM  Depression screen PHQ 2/9  Decreased Interest 0 3 2  Down, Depressed, Hopeless 0 3 2  PHQ - 2 Score 0 6 4  Altered sleeping Tired, decreased energy Change in appetite 0 2 3  Feeling bad  or failure about yourself  0 0 0  Trouble concentrating 1 0 0  Moving slowly or fidgety/restless Suicidal thoughts 1 0 0  PHQ-9 Score Difficult doing work/chores Not difficult at all Very difficult Not difficult at all     Social History   Tobacco Use  Smoking Status Never  Smokeless Tobacco Never   BP Readings from Last 3 Encounters:  11/29/22 (!) 170/100  08/30/22 130/74  06/12/22 (!) 142/68   Pulse Readings from Last 3 Encounters:  11/29/22 67  08/30/22 65  06/12/22 69   Wt Readings from Last 3 Encounters:  11/29/22 174 lb (78.9 kg)  04/12/22 175 lb (79.4 kg)  02/13/22 174 lb 9.6 oz (79.2 kg)   BMI Readings from Last 3 Encounters:  11/29/22 25.70 kg/m  04/12/22 25.84 kg/m  02/13/22 25.78 kg/m    Allergies  Allergen Reactions   Lactose Intolerance (Gi) Diarrhea   Apple Pectin [Pectin] Itching    ITCHY THROAT   Lactose Other (See Comments)   Metformin And Related     D/t decreased kidney function    Peach [Prunus Persica] Itching    ITCHY THROAT   Morphine And Related Anxiety    Jittery    Medications Reviewed Today     Reviewed by Louann Sjogren, DPM (Physician) on 12/17/22 at [phone]  Med List Status: <None>   Medication Order Taking? Sig Documenting Provider Last Dose Status Informant  amLODipine (NORVASC) 10 MG tablet [id number] No Take 1 tablet (10 mg total) by mouth daily.  Patient taking differently: Take 5 mg by mouth daily.   Nafziger, Kandee Keen, NP Taking Active   aspirin EC 81 MG tablet [id number] No Take 81 mg by mouth daily. [provider] Taking Active   atorvastatin (LIPITOR) 10 MG tablet [id number] No TAKE ONE TABLET BY MOUTH EVERY MORNING Nafziger, Kandee Keen, NP Taking Active   blood glucose meter kit and supplies KIT [id number] No Dispense based on patient and insurance preference. Use up to four times daily as directed. Nafziger, Kandee Keen, NP Taking Active   buPROPion (WELLBUTRIN XL) 300 MG 24 hr tablet [id number] No Take 1  tablet (300 mg total) by mouth daily. Shirline Frees, NP Taking Active  chlorthalidone (HYGROTON) 25 MG tablet [id number] No Take 25 mg by mouth daily. [provider] Taking Active   Continuous Blood Gluc Receiver (FREESTYLE LIBRE 14 DAY READER) DEVI [id number] No Use with freestyle libre system Nafziger, [place], NP Taking Active   Continuous Blood Gluc Sensor (FREESTYLE LIBRE 3 SENSOR) [place] [id number] No USE TO check blood glucose AS DIRECTED AND CHANGE sensor every 14 DAYS [provider] Taking Active   gabapentin (NEURONTIN) 300 MG capsule [id number] No TAKE ONE CAPSULE BY MOUTH EVERYDAY AT BEDTIME Nafziger, Kandee Keen, NP Taking Active   glipiZIDE (GLUCOTROL XL) 2.5 MG 24 hr tablet [id number] No TAKE ONE TABLET BY MOUTH EVERY MORNING and TAKE ONE TABLET BY MOUTH EVERY EVENING  Patient taking differently: Take 2.5 mg by mouth daily with breakfast.   Shirline Frees, NP Taking Active   insulin glargine (LANTUS SOLOSTAR) 100 UNIT/ML Solostar Pen [id number]  Inject 10 Units into the skin daily. Nafziger, Kandee Keen, NP  Active   Insulin Pen Needle (B-D ULTRAFINE III SHORT PEN) 31G X 8 MM MISC [id number] No USE WITH LANUTS PEN Nafziger, Kandee Keen, NP Taking Active   Lancets [facility] DELICA PLUS [place]) MISC [id number] No Apply topically. [provider] Taking Active   losartan (COZAAR) 100 MG tablet [id number] No TAKE ONE TABLET BY MOUTH EVERY MORNING Nafziger, Kandee Keen, NP Taking Active   metoprolol tartrate (LOPRESSOR) 50 MG tablet [id number] No TAKE ONE TABLET BY MOUTH EVERY MORNING and TAKE ONE TABLET BY MOUTH EVERYDAY AT BEDTIME Nafziger, Kandee Keen, NP Taking Active   ONETOUCH ULTRA test strip [id number] No USE TO check blood glucose UP TO four times daily AS DIRECTED [provider] Taking Active   tiZANidine (ZANAFLEX) 2 MG tablet [id number] No TAKE ONE TABLET BY MOUTH EVERYDAY AT BEDTIME Nafziger, Kandee Keen, NP Taking Active   torsemide (DEMADEX) 20 MG tablet [id number]  Take 20 mg by mouth 2  (two) times daily. [provider]  Active             SDOH:  (Social Determinants of Health) assessments and interventions performed: Yes SDOH Interventions    Flowsheet Row Chronic Care Management from 08/21/2022 in [facility] at [place] Office Visit from 01/17/2022 in [facility] at [place] Chronic Care Management from 07/17/2021 in [facility] [place] Chronic Care Management from 03/11/2020 in [facility] at [place] Chronic Care Management from 02/09/2020 in [facility] at [place] Office Visit from 10/10/2017 in [facility] [place] [facility] at [place]  SDOH Interventions        Food Insecurity Interventions -- Intervention Not Indicated Intervention Not Indicated -- -- --  Housing Interventions -- Intervention Not Indicated Intervention Not Indicated -- -- --  Transportation Interventions -- Intervention Not Indicated Intervention Not Indicated Intervention Not Indicated Intervention Not Indicated  [Patient states spouse helps with transportation.] --  Depression Interventions/Treatment  -- -- -- -- -- Medication  Financial Strain Interventions Intervention Not Indicated Intervention Not Indicated Intervention Not Indicated -- Intervention Not Indicated --  Physical Activity Interventions -- [facility] Other (Comments)  [reviewed insurance fitness benefit] -- -- --  Stress Interventions -- Intervention Not Indicated Intervention Not Indicated -- -- --  Social Connections Interventions -- Intervention Not Indicated -- -- -- --       Medication Assistance: None required.  Patient affirms current coverage meets needs.  Medication Access: Within the past 30 days, how often has patient missed a dose of medication? None Is a  pillbox or other method used to improve adherence? Yes  Factors that may affect medication adherence? no barriers  identified Are meds synced by current pharmacy? Yes  Are meds delivered by current pharmacy? Yes  Does patient experience delays in picking up medications due to transportation concerns? No   [facility] Reviewed: Is patient disadvantaged to use [facility]?: No  Current Rx insurance plan: HTA Name and location of Current pharmacy:  [facility] - [place], [place] [address] [place] [address] Phone: [phone] Fax: [phone]  [facility] services reviewed with patient today?: No  Patient requests to transfer care to [facility]?: No  Reason patient declined to change pharmacies: Patient is already actively enrolled with [facility]  Compliance/Adherence/Medication fill history: Care Gaps: Diabetic Urine - Due 01/2022 AWV - Overdue - Due 01/2022  Star-Rating Drugs: Losartan 100 mg - Last filled 11/02/22 90 DS at [facility] Atorvastatin 10 mg - Last filled 11/02/22 90 DS at [facility] Glipizide 2.5 mg - Last filled 11/02/22 90 DS at [facility]   Assessment/Plan   Hypertension (BP goal <140/90) -Uncontrolled -Current treatment: Amlodipine 10mg  1 qd Appropriate, Effective, Safe, Accessible Chlorthalidone 25mg  1 qd Appropriate, Effective, Safe, Accessible Losartan 100mg  1 qd Appropriate, Effective, Safe, Accessible Metoprolol tartrate 50mg  1 BID Appropriate, Effective, Safe, Accessible Torsemide 20mg  1 BID Appropriate, Effective, Safe, Accessible -Medications previously tried: hctz, lasix, lisinopril  -Current home readings: machine dead -Current dietary habits: mindful of salt intake -Current exercise habits: up and down stairs, feet/leg exercises when he sits -Most recent elevated BP in office was taken prior to patient taking his medications that day, unable to assess control at home as patient has not been checking. -Denies hypotensive/hypertensive symptoms -Educated on BP goals and  benefits of medications for prevention of heart attack, stroke and kidney damage; Daily salt intake goal < 2300 mg; Exercise goal of 150 minutes per week; Importance of home blood pressure monitoring; Proper BP monitoring technique; Symptoms of hypotension and importance of maintaining adequate hydration; -Counseled to monitor BP at home 2-3x/week, document, and provide log at future appointments -Recommended to continue current medication  Diabetes (A1c goal <7%) -Uncontrolled -Current medications: Glipizide XL 2.5mg  1 qam with breakfast and 1 with lunch Appropriate, Query Effective Lantus 10 units into skin daily Appropriate, Effective, Safe, Accessible -Medications previously tried: Technical sales engineer, Jardiance, Metformin  -Current home glucose readings fasting glucose: 113 this morning, ranges anywhere from 70-130 usually, but admits to 180 a couple of days ago after eating something sweet the night before -Denies hypoglycemic/hyperglycemic symptoms -Current meal patterns:  drinks: water -Current exercise: see above -Educated on A1c and blood sugar goals; Complications of diabetes including kidney damage, retinal damage, and cardiovascular disease; Exercise goal of 150 minutes per week; Proper insulin injection technique; Prevention and management of hypoglycemic episodes; Continuous glucose monitoring; -Counseled to check feet daily and get yearly eye exams - eye exam 3 weeks -Counseled on diet and exercise extensively Recommended to continue current medication, future consideration if A1C still elevated at next check in June, start ozempic  Chronic Kidney Disease Stage 4  -All medications assessed for renal dosing and appropriateness in chronic kidney disease.   Sherrill Raring Clinical Pharmacist [phone]

## 2022-12-17 NOTE — Progress Notes (Signed)
  Subjective:  Patient ID: Margy Clarks, male    DOB: [birth year],   MRN: [id number]  Chief Complaint  Patient presents with   Diabetes    Nail care     72 y.o. male presents for concern of thickened elongated and painful nails that are difficult to trim. Requesting to have them trimmed today. Relates burning and tingling in their feet. Patient is diabetic and last A1c was  Lab Results  Component Value Date   HGBA1C 7.5 (A) 11/29/2022   .   PCP:  Shirline Frees, NP    . Denies any other pedal complaints. Denies n/v/f/c.   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 CFT <3 seconds. Absent hair growth on digits. Edema noted to bilateral lower extremities. Xerosis noted bilaterally.  Skin. No lacerations or abrasions bilateral feet. Nails 1-5 bilateral  are thickened discolored and elongated with subungual debris. Hyperkeratotic lesion noted plantar left heel and posterior right heel.  Musculoskeletal: MMT 5/5 bilateral lower extremities in DF, PF, Inversion and Eversion. Deceased ROM in DF of ankle joint. Mild HAV deformity bilateral and hammered digits 2-5 bilateral.  Neurological: Sensation intact to light touch. Protective sensation diminished bilateral.    Assessment:   1. Pain due to onychomycosis of toenails of both feet   2. Type 2 diabetes mellitus with diabetic neuropathy, with long-term current use of insulin   3. Callus   4. Hammertoe, bilateral   5. Bilateral bunions   6. Pes planus of both feet      Plan:  Patient was evaluated and treated and all questions answered. -Discussed and educated patient on diabetic foot care, especially with  regards to the  vascular, neurological and musculoskeletal systems.  -Stressed the importance of good glycemic control and the detriment of not  controlling glucose levels in relation to the foot. -Discussed supportive shoes at all times and checking feet regularly.  -Mechanically debrided all nails 1-5 bilateral using sterile nail nipper and filed with dremel without incident  -Hyperkeratotc area debrided with chisel without incident.  -Placed order for DM shoes.  -Answered all patient questions -Patient to return  in 3 months for at risk foot care -Patient advised to call the office if any problems or questions arise in the meantime.   Louann Sjogren, DPM

## 2022-12-18 ENCOUNTER — Telehealth: Payer: Self-pay

## 2022-12-18 NOTE — Progress Notes (Signed)
Patient ID: Russell Austin, male   DOB: 1951/06/28, 72 y.o.   MRN: [id number]  Care Management & Coordination Services Pharmacy Team  Reason for Encounter: Appointment Reminder  Contacted patient to confirm telephone appointment with Milas Kocher, PharmD on 12/19/22 at 9. Spoke with patient on 12/18/2022   Star Rating Drugs:  Losartan 100 mg - Last filled 11/02/22 90 DS at [facility] Atorvastatin 10 mg - Last filled 11/02/22 90 DS at [facility] Glipizide 2.5 mg - Last filled 11/02/22 90 DS at [facility]   Care Gaps: Diabetic Urine - Overdue AWV - Overdue    Pamala Duffel CMA Clinical Pharmacist Assistant [phone]

## 2022-12-19 ENCOUNTER — Ambulatory Visit: Payer: HMO

## 2022-12-24 DIAGNOSIS — N184 Chronic kidney disease, stage 4 (severe): Secondary | ICD-10-CM | POA: Diagnosis not present

## 2022-12-28 ENCOUNTER — Telehealth: Payer: Self-pay

## 2022-12-28 NOTE — Telephone Encounter (Signed)
Pt PPW came in from [facility]. Pt has been scheduled with Dr.Fry for PPW to be filled out.

## 2022-12-31 DIAGNOSIS — E1122 Type 2 diabetes mellitus with diabetic chronic kidney disease: Secondary | ICD-10-CM | POA: Diagnosis not present

## 2022-12-31 DIAGNOSIS — I129 Hypertensive chronic kidney disease with stage 1 through stage 4 chronic kidney disease, or unspecified chronic kidney disease: Secondary | ICD-10-CM | POA: Diagnosis not present

## 2022-12-31 DIAGNOSIS — N184 Chronic kidney disease, stage 4 (severe): Secondary | ICD-10-CM | POA: Diagnosis not present

## 2022-12-31 DIAGNOSIS — D631 Anemia in chronic kidney disease: Secondary | ICD-10-CM | POA: Diagnosis not present

## 2022-12-31 DIAGNOSIS — N2581 Secondary hyperparathyroidism of renal origin: Secondary | ICD-10-CM | POA: Diagnosis not present

## 2023-01-02 ENCOUNTER — Telehealth: Payer: Self-pay

## 2023-01-02 NOTE — Progress Notes (Signed)
Patient ID: Russell Austin, male   DOB: [birth year], 72 y.o.   MRN: [id number]  Care Management & Coordination Services Pharmacy Team  Reason for Encounter: Hypertension & Medication Coordination   Spoke with patient on 01/16/2023  Cycle dispensing form sent to [facility] for review.   Last adherence delivery date:10/26/22      Patient is due for next adherence delivery on: 01/29/23  This delivery to include: Adherence Packaging  90 Days  Calcitrol 0.25 Take one cap at  at breakfast Tizanidine 2 mg - take one tablet at bedtime Gabapentin 300 mg -  take one capsule at bedtime Glipizide ER 2.5 mg - take one tablet every morning and one tablet every evening Bupropion HCL 300 mg - take one tablet every morning Aspirin 81 mg - take one tablet at breakfast Chlorthalidone 25 mg - 1/2 tablet at breakfast  Atorvastatin 10 mg - take one tablet at breakfast Amlodipine 5 mg - take one tablet at breakfast Metoprolol Tartrate 50 mg - take one tablet every morning and one tablet every evening Losartan 100 mg - take one tablet every morning  Freestyle Libre Sensors  to go 01/21/23 patient aware and in agreement Torsemide 20 to go 03/24/23 patient aware and in agreement   Refills requested from providers include: Russell Level, MD [phone] chlorthalidone 25 mg tablet   Confirmed delivery date of 01/29/23, advised patient that pharmacy will contact them the morning of delivery.   Any concerns about your medications? No  How often do you forget or accidentally miss a dose? Never  Do you use a pillbox? No  Is patient in packaging Yes   Current antihypertensive regimen:  Amlodipine 10mg  1 qd  Chlorthalidone 25mg  1 qd  Losartan 100mg  1 qd  Metoprolol tartrate 50mg  1 BID  Torsemide 20mg  1 BID  Patient verbally confirms he is taking the above medications as directed. Yes  How often are you checking your Blood Pressure? several times per month  he checks his blood pressure in the  afternoon after taking his medication.  Current home BP readings: 130/74   Any readings above 180/100? No If yes any symptoms of hypertensive emergency? patient denies any symptoms of high blood pressure  What recent interventions/DTPs have been made by any provider to improve Blood Pressure control since last CPP Visit: Patient reports none  Any recent hospitalizations or ED visits since last visit with CPP? No   Adherence Review: Is the patient currently on ACE/ARB medication? Yes Does the patient have >5 day gap between last estimated fill dates? No  Star Rating Drugs:  Losartan 100 mg - Last filled 11/02/22 90 DS at [facility] Atorvastatin 10 mg - Last filled 11/02/22 90 DS at [facility] Glipizide 2.5 mg - Last filled 11/02/22 90 DS at [facility]   Chart Updates: Recent office visits:  01/08/23 Russell Salisbury, MD - Patient presented for Type 2 diabetes mellitus with diabetic neuropathy with log term current use of insulin and other concerns.   Recent consult visits:  None  Hospital visits:  None in previous 6 months  Medications: Outpatient Encounter Medications as of 01/02/2023  Medication Sig   amLODipine (NORVASC) 10 MG tablet Take 1 tablet (10 mg total) by mouth daily. (Patient taking differently: Take 5 mg by mouth daily.)   aspirin EC 81 MG tablet Take 81 mg by mouth daily.   atorvastatin (LIPITOR) 10 MG tablet TAKE ONE TABLET BY MOUTH EVERY MORNING   blood glucose meter kit and  supplies KIT Dispense based on patient and insurance preference. Use up to four times daily as directed.   buPROPion (WELLBUTRIN XL) 300 MG 24 hr tablet Take 1 tablet (300 mg total) by mouth daily.   chlorthalidone (HYGROTON) 25 MG tablet Take 25 mg by mouth daily.   Continuous Blood Gluc Receiver (FREESTYLE LIBRE 14 DAY READER) DEVI Use with freestyle libre system   Continuous Blood Gluc Sensor (FREESTYLE LIBRE 3 SENSOR) MISC USE TO check blood glucose AS DIRECTED AND CHANGE sensor every 14 DAYS    gabapentin (NEURONTIN) 300 MG capsule TAKE ONE CAPSULE BY MOUTH EVERYDAY AT BEDTIME   glipiZIDE (GLUCOTROL XL) 2.5 MG 24 hr tablet TAKE ONE TABLET BY MOUTH EVERY MORNING and TAKE ONE TABLET BY MOUTH EVERY EVENING (Patient taking differently: Take 2.5 mg by mouth daily with breakfast.)   insulin glargine (LANTUS SOLOSTAR) 100 UNIT/ML Solostar Pen Inject 10 Units into the skin daily.   Insulin Pen Needle (B-D ULTRAFINE III SHORT PEN) 31G X 8 MM MISC USE WITH LANUTS PEN   Lancets (ONETOUCH DELICA PLUS LANCET33G) MISC Apply topically.   losartan (COZAAR) 100 MG tablet TAKE ONE TABLET BY MOUTH EVERY MORNING   metoprolol tartrate (LOPRESSOR) 50 MG tablet TAKE ONE TABLET BY MOUTH EVERY MORNING and TAKE ONE TABLET BY MOUTH EVERYDAY AT BEDTIME   ONETOUCH ULTRA test strip USE TO check blood glucose UP TO four times daily AS DIRECTED   tiZANidine (ZANAFLEX) 2 MG tablet TAKE ONE TABLET BY MOUTH EVERYDAY AT BEDTIME   torsemide (DEMADEX) 20 MG tablet Take 20 mg by mouth 2 (two) times daily.   No facility-administered encounter medications on file as of 01/02/2023.    Recent Office Vitals: BP Readings from Last 3 Encounters:  11/29/22 (!) 170/100  08/30/22 130/74  06/12/22 (!) 142/68   Pulse Readings from Last 3 Encounters:  11/29/22 67  08/30/22 65  06/12/22 69    Wt Readings from Last 3 Encounters:  11/29/22 174 lb (78.9 kg)  04/12/22 175 lb (79.4 kg)  02/13/22 174 lb 9.6 oz (79.2 kg)     Kidney Function Lab Results  Component Value Date/Time   CREATININE 3.52 (H) 12/19/2021 07:41 AM   CREATININE 3.82 (H) 12/09/2020 08:04 AM   GFR 16.87 (L) 12/19/2021 07:41 AM   GFRNONAA 41 (L) 10/28/2018 09:12 AM   GFRAA 47 (L) 10/28/2018 09:12 AM       Latest Ref Rng & Units 12/19/2021    7:41 AM 12/09/2020    8:04 AM 11/11/2019    7:44 AM  BMP  Glucose 70 - 99 mg/dL 161  95  096   BUN 6 - 23 mg/dL 44  53  31   Creatinine 0.40 - 1.50 mg/dL 0.45  4.09  8.11   Sodium 135 - 145 mEq/L 140  136  139    Potassium 3.5 - 5.1 mEq/L 4.9  4.2  4.7   Chloride 96 - 112 mEq/L 108  103  107   CO2 19 - 32 mEq/L 25  26  28    Calcium 8.4 - 10.5 mg/dL 9.0  8.6  8.7       Russell Austin CMA Clinical Pharmacist Assistant [phone]

## 2023-01-08 ENCOUNTER — Ambulatory Visit (INDEPENDENT_AMBULATORY_CARE_PROVIDER_SITE_OTHER): Payer: PPO | Admitting: Family Medicine

## 2023-01-08 ENCOUNTER — Encounter: Payer: Self-pay | Admitting: Family Medicine

## 2023-01-08 VITALS — BP 128/78 | HR 72 | Temp 97.7°F | Wt 174.0 lb

## 2023-01-08 DIAGNOSIS — I10 Essential (primary) hypertension: Secondary | ICD-10-CM

## 2023-01-08 DIAGNOSIS — Z794 Long term (current) use of insulin: Secondary | ICD-10-CM

## 2023-01-08 DIAGNOSIS — E1140 Type 2 diabetes mellitus with diabetic neuropathy, unspecified: Secondary | ICD-10-CM

## 2023-01-08 DIAGNOSIS — I6302 Cerebral infarction due to thrombosis of basilar artery: Secondary | ICD-10-CM

## 2023-01-08 NOTE — Progress Notes (Signed)
   Subjective:    Patient ID: Russell Austin, male    DOB: [birth year], 72 y.o.   MRN: [id number]  HPI Here with his wife for a diabetic foot exam. He sees Shirline Frees NP for primary care and for care of his type 2 diabetes. He takes Lantus 10 units daily and Glipizide 2.5 mg BID. His last A1c on 11-29-22 was 7.5%. he has a hx of peripheral neuropathy, and according to his chart he has numbness in both feet. He sees Geralynn Rile DPM for podiatric care, and she is in the process of obtaining diabetic footwear for him. He is also S/P a stroke and he has chronic lower extremity edema.    Review of Systems  Constitutional: Negative.   Respiratory: Negative.    Cardiovascular:  Positive for leg swelling. Negative for chest pain and palpitations.  Neurological:  Positive for weakness and numbness.       Objective:   Physical Exam Constitutional:      Comments: In a wheelchair   Cardiovascular:     Rate and Rhythm: Normal rate and regular rhythm.     Pulses: Normal pulses.     Heart sounds: Normal heart sounds.  Pulmonary:     Effort: Pulmonary effort is normal.     Breath sounds: Normal breath sounds.  Musculoskeletal:     Comments: Both lower legs and feet have 2+ edema. Both feet are pink and warm. No visible areas of skin breakdown. PT pulses are 2/4 on both sides, and DP pulses are 3/4 on both sides. Sensation to light touch is slightly decreased on both ankles and feet, and this is symmetrical.   Skin:    General: Skin is warm and dry.  Neurological:     Mental Status: He is alert.           Assessment & Plan:  He has mild diabetic neuropathy in both feet due to type 2 diabetes. He would benefit from wearing diabetic footwear.  Gershon Crane, MD

## 2023-01-15 ENCOUNTER — Other Ambulatory Visit: Payer: Self-pay | Admitting: Adult Health

## 2023-01-15 DIAGNOSIS — F339 Major depressive disorder, recurrent, unspecified: Secondary | ICD-10-CM

## 2023-01-21 ENCOUNTER — Ambulatory Visit (INDEPENDENT_AMBULATORY_CARE_PROVIDER_SITE_OTHER): Payer: PPO

## 2023-01-21 VITALS — Ht 69.0 in | Wt 174.0 lb

## 2023-01-21 DIAGNOSIS — Z0000 Encounter for general adult medical examination without abnormal findings: Secondary | ICD-10-CM | POA: Diagnosis not present

## 2023-01-21 NOTE — Progress Notes (Signed)
Subjective:   Russell Austin is a 72 y.o. male who presents for Medicare Annual/Subsequent preventive examination.  Review of Systems    Virtual Visit via Telephone Note  I connected with  Russell Austin on 01/23/23 at 12:30 PM EDT by telephone and verified that I am speaking with the correct person using two identifiers.  Location: Patient: Home Provider: Office Persons participating in the virtual visit: patient/Nurse Health Advisor   I discussed the limitations, risks, security and privacy concerns of performing an evaluation and management service by telephone and the availability of in person appointments. The patient expressed understanding and agreed to proceed.  Interactive audio and video telecommunications were attempted between this nurse and patient, however failed, due to patient having technical difficulties OR patient did not have access to video capability.  We continued and completed visit with audio only.  Some vital signs may be absent or patient reported.   Tillie Rung, LPN  Cardiac Risk Factors include: advanced age (>1men, >53 women);diabetes mellitus;male gender;hypertension     Objective:    Today's Vitals   01/21/23 1234  Weight: 174 lb (78.9 kg)  Height: 5\' 9"  (1.753 m)   Body mass index is 25.7 kg/m.     01/21/2023   12:43 PM 01/17/2022   12:53 PM 01/01/2022    8:09 AM 07/17/2021    9:14 AM 01/11/2021    9:00 AM 12/30/2019   12:52 PM 10/28/2018    8:08 AM  Advanced Directives  Does Patient Have a Medical Advance Directive? Yes Yes Yes Yes Yes No No  Type of Estate agent of [place];Living will Healthcare Power of [place];Living will  Healthcare Power of [place];Living will Healthcare Power of Attorney    Does patient want to make changes to medical advance directive?  No - Patient declined No - Patient declined No - Patient declined     Copy of Healthcare Power of Attorney in Chart? No - copy requested No -  copy requested  No - copy requested No - copy requested    Would patient like information on creating a medical advance directive?      Yes (MAU/Ambulatory/Procedural Areas - Information given) Yes (ED - Information included in AVS)    Current Medications (verified) Outpatient Encounter Medications as of 01/21/2023  Medication Sig   amLODipine (NORVASC) 10 MG tablet Take 1 tablet (10 mg total) by mouth daily. (Patient taking differently: Take 5 mg by mouth daily.)   aspirin EC 81 MG tablet Take 81 mg by mouth daily.   atorvastatin (LIPITOR) 10 MG tablet TAKE ONE TABLET BY MOUTH EVERY MORNING   blood glucose meter kit and supplies KIT Dispense based on patient and insurance preference. Use up to four times daily as directed.   buPROPion (WELLBUTRIN XL) 300 MG 24 hr tablet TAKE ONE TABLET BY MOUTH EVERY MORNING   chlorthalidone (HYGROTON) 25 MG tablet Take 25 mg by mouth daily.   Continuous Blood Gluc Receiver (FREESTYLE LIBRE 14 DAY READER) DEVI Use with freestyle libre system   Continuous Blood Gluc Sensor (FREESTYLE LIBRE 3 SENSOR) MISC USE TO check blood glucose AS DIRECTED AND CHANGE sensor every 14 DAYS   gabapentin (NEURONTIN) 300 MG capsule TAKE ONE CAPSULE BY MOUTH EVERYDAY AT BEDTIME   glipiZIDE (GLUCOTROL XL) 2.5 MG 24 hr tablet TAKE ONE TABLET BY MOUTH EVERY MORNING and TAKE ONE TABLET BY MOUTH EVERY EVENING (Patient taking differently: Take 2.5 mg by mouth daily with breakfast.)   insulin  glargine (LANTUS SOLOSTAR) 100 UNIT/ML Solostar Pen Inject 10 Units into the skin daily.   Insulin Pen Needle (B-D ULTRAFINE III SHORT PEN) 31G X 8 MM MISC USE WITH LANUTS PEN   Lancets (ONETOUCH DELICA PLUS LANCET33G) MISC Apply topically.   losartan (COZAAR) 100 MG tablet TAKE ONE TABLET BY MOUTH EVERY MORNING   metoprolol tartrate (LOPRESSOR) 50 MG tablet TAKE ONE TABLET BY MOUTH EVERY MORNING and TAKE ONE TABLET BY MOUTH EVERYDAY AT BEDTIME   ONETOUCH ULTRA test strip USE TO check blood glucose  UP TO four times daily AS DIRECTED   tiZANidine (ZANAFLEX) 2 MG tablet TAKE ONE TABLET BY MOUTH EVERYDAY AT BEDTIME   torsemide (DEMADEX) 20 MG tablet Take 20 mg by mouth 2 (two) times daily.   No facility-administered encounter medications on file as of 01/21/2023.    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   Diabetes mellitus type II    Diabetic retinopathy FOLLOWED BY DR Luciana Axe   ED (erectile dysfunction)    Glaucoma of both eyes    Hypertension    Left hydrocele    Stroke [facility])    Past Surgical History:  Procedure Laterality Date   APPENDECTOMY  AGE EARLY 20'S   COLONOSCOPY  12/30/2020   every 5 years   HYDROCELE EXCISION  03/31/2012   Procedure: HYDROCELECTOMY ADULT;  Surgeon: Marcine Matar, MD;  [facility];  Service: Urology;  Laterality: Left;  45 MINS     LEFT EYE LASER RETINA REPAIR  SEPT 2012   RIGHT EYE VITRECTOMY/ INSERTION GLAUCOMA SETON/ LASER REPAIR  12-13-2008   RETINAL ARTERY OCCLUSION /NEOVASCULAR GLAUCOMA/ HEMORRHAGE   RIGHT EYE VITRETOMY/ INSERTION GLAUCOMA SETON X2/ LASER  03-24-2009   RECURRENT HEMORRHAGE/ OCCLUSION INTERNAL SETON   SHOULDER ARTHROSCOPY Right 2005   undescended right testicle removed  1994   Family History  Problem Relation Age of Onset   Glaucoma Mother    Diabetes Mother    Stomach cancer Maternal Grandfather    Stroke Maternal Grandmother    Social History   Socioeconomic History   Marital status: Married    Spouse name: Not on file   Number of children: 1   Years of education: 12   Highest education level: 12th grade  Occupational History   Occupation: Disabled  Tobacco Use   Smoking status: Never   Smokeless tobacco: Never  Substance and Sexual Activity   Alcohol use: No    Drug use: No   Sexual activity: Not on file  Other Topics Concern   Not on file  Social History Narrative   Lives at home with his wife and granddaughter.   Left-handed.   3 cups caffeine per day.   Social Determinants of Health   Financial Resource Strain: Low Risk  (01/21/2023)   Overall Financial Resource Strain (CARDIA)    Difficulty of Paying Living Expenses: Not hard at all  Food Insecurity: No Food Insecurity (01/21/2023)   Hunger Vital Sign    Worried About Running Out of Food in the Last Year: Never true    Ran Out of Food in the Last Year: Never true  Transportation Needs: No Transportation Needs (01/21/2023)   PRAPARE - Administrator, Civil Service (Medical): No    Lack of Transportation (Non-Medical): No  Physical  Activity: Inactive (01/21/2023)   Exercise Vital Sign    Days of Exercise per Week: 0 days    Minutes of Exercise per Session: 0 min  Stress: No Stress Concern Present (01/21/2023)   [employer] of Occupational Health - Occupational Stress Questionnaire    Feeling of Stress : Not at all  Social Connections: Socially Integrated (01/21/2023)   Social Connection and Isolation Panel [NHANES]    Frequency of Communication with Friends and Family: More than three times a week    Frequency of Social Gatherings with Friends and Family: More than three times a week    Attends Religious Services: More than 4 times per year    Active Member of [employer] or Organizations: Yes    Attends Engineer, structural: More than 4 times per year    Marital Status: Married    Tobacco Counseling Counseling given: Not Answered   Clinical Intake:  Pre-visit preparation completed: No  Pain : No/denies pain Nutrition Risk Assessment:  Has the patient had any N/V/D within the last 2 months?  No  Does the patient have any non-healing wounds?  No  Has the patient had any unintentional weight loss or weight gain?  No   Diabetes:  Is the patient diabetic?   Yes  If diabetic, was a CBG obtained today?  Yes CBG 107 Taken by patient Did the patient bring in their glucometer from home?  No  How often do you monitor your CBG's? .  2 x Daily Financial Strains and Diabetes Management:  Are you having any financial strains with the device, your supplies or your medication? No .  Does the patient want to be seen by Chronic Care Management for management of their diabetes?  No  Would the patient like to be referred to a Nutritionist or for Diabetic Management?  No   Diabetic Exams:  Diabetic Eye Exam: Completed . Overdue for diabetic eye exam. Pt has been advised about the importance in completing this exam. A referral has been placed today. Message sent to referral coordinator for scheduling purposes. Advised pt to expect a call from office referred to regarding appt.  Diabetic Foot Exam: Completed . Pt has been advised about the importance in completing this exam. Pt is scheduled for diabetic foot exam on Followed by PCP.      BMI - recorded: 25.7 Nutritional Status: BMI 25 -29 Overweight Nutritional Risks: None Diabetes: No  How often do you need to have someone help you when you read instructions, pamphlets, or other written materials from your doctor or pharmacy?: 1 - Never  Diabetic?  Yes  Interpreter Needed?: No  Information entered by :: Theresa Mulligan LPN   Activities of Daily Living    01/21/2023   12:41 PM  In your present state of health, do you have any difficulty performing the following activities:  Hearing? 0  Vision? 0  Difficulty concentrating or making decisions? 0  Walking or climbing stairs? 0  Dressing or bathing? 0  Doing errands, shopping? 0  Preparing Food and eating ? N  Using the Toilet? N  In the past six months, have you accidently leaked urine? N  Do you have problems with loss of bowel control? N  Managing your Medications? N  Managing your Finances? N  Housekeeping or managing your Housekeeping? N     Patient Care Team: Shirline Frees, NP as PCP - General (Family Medicine) Irena Cords, Enzo Montgomery, MD as Consulting Physician (Allergy and Immunology) [facility] (Ophthalmology) Wynell Balloon, MD (Ophthalmology) Maurice Small Clovis Pu, MD as Consulting Physician (Neurosurgery) Valentino Nose Bernestine Amass, MD as Consulting Physician (Nephrology) [facility] (Plastic Surgery) Sherrill Raring, [facility] (Pharmacist)  Indicate any recent Medical Services you may have received from other than [facility] providers in the past year (date may be approximate).     Assessment:   This is a routine wellness examination for Russell Austin.  Hearing/Vision screen Hearing Screening - Comments:: Denies hearing difficulties   Vision Screening - Comments:: Wears reading glasses - up to date with routine eye exams with  Dr Allyne Gee  Dietary issues and exercise activities discussed: Current Exercise Habits: The patient does not participate in regular exercise at present, Exercise limited by: None identified   Goals Addressed               This Visit's Progress     Increase physical activity (pt-stated)         Depression Screen    01/21/2023   12:40 PM 01/08/2023    8:36 AM 11/29/2022    7:12 AM 08/30/2022    7:52 AM 04/12/2022   10:09 AM 01/17/2022   12:44 PM 01/08/2022    1:44 PM  PHQ 2/9 Scores  PHQ - 2 Score 0 1 0 6 4 0 0  PHQ- 9 Score 0 14 7 16 16  0 1    Fall Risk    01/21/2023   12:41 PM 01/08/2023    8:35 AM 06/11/2022    6:22 PM 02/16/2022    9:42 AM 01/17/2022   12:50 PM  Fall Risk   Falls in the past year? 1 Exclusion - non ambulatory 1 1 1   Number falls in past yr: 1 1 1 1  0  Injury with Fall? 0 1 1 1 1   Comment    fx rib Fx Rib Followed by PCP  Risk for fall due to : No Fall Risks;Impaired balance/gait Orthopedic patient  History of fall(s);Impaired balance/gait;Impaired mobility Other (Comment)  Risk for fall due to: Comment     Lost balance  Follow up  Falls prevention discussed   Education provided;Falls prevention discussed Falls prevention discussed    FALL RISK PREVENTION PERTAINING TO THE HOME:  Any stairs in or around the home? Yes  If so, are there any without handrails? No  Home free of loose throw rugs in walkways, pet beds, electrical cords, etc? Yes  Adequate lighting in your home to reduce risk of falls? Yes   ASSISTIVE DEVICES UTILIZED TO PREVENT FALLS:  Life alert? No  Use of a cane, walker or w/c? Yes  Grab bars in the bathroom? No  Shower chair or bench in shower? No  Elevated toilet seat or a handicapped toilet? No   TIMED UP AND GO:  Was the test performed? No .Audio Visit    Cognitive Function:        01/21/2023   12:43 PM 01/17/2022   12:53 PM 01/11/2021    9:04 AM  6CIT Screen  What Year? 0 points 0 points 0 points  What month? 0 points 0 points 0 points  What time? 0 points 0 points   Count back from 20 0 points 0 points 0 points  Months in reverse 0 points 0 points 4 points  Repeat phrase 0 points 0 points 4 points  Total Score 0 points 0 points     Immunizations Immunization History  Administered Date(s) Administered   COVID-19, mRNA, vaccine(Comirnaty)12 years and older  07/16/2022   Fluad Quad(high Dose 65+) 05/14/2019, 05/26/2020, 06/08/2021, 07/16/2022   Influenza Split 07/02/2011, 07/17/2012   Influenza, High Dose Seasonal PF 05/29/2017   Influenza,inj,Quad PF,6+ Mos 05/13/2013, 06/15/2015, 06/19/2016, 06/30/2018   Influenza-Unspecified 06/30/2018   PFIZER Comirnaty(Gray Top)Covid-19 Tri-Sucrose Vaccine 06/14/2021   PFIZER(Purple Top)SARS-COV-2 Vaccination 09/25/2019, 10/16/2019, 06/07/2020   Pneumococcal Conjugate-13 10/10/2017   Pneumococcal Polysaccharide-23 12/20/2014, 11/11/2019   Td 02/02/2010, 09/28/2022   Zoster Recombinat (Shingrix) 06/14/2021, 08/14/2021    TDAP status: Up to date  Flu Vaccine status: Up to date  Pneumococcal vaccine status: Up to date  Covid-19  vaccine status: Completed vaccines  Qualifies for Shingles Vaccine? Yes   Zostavax completed Yes   Shingrix Completed?: Yes  Screening Tests Health Maintenance  Topic Date Due   FOOT EXAM  12/20/2022   Diabetic kidney evaluation - eGFR measurement  04/23/2023 (Originally 12/20/2022)   INFLUENZA VACCINE  04/04/2023   HEMOGLOBIN A1C  06/01/2023   Diabetic kidney evaluation - Urine ACR  08/31/2023   OPHTHALMOLOGY EXAM  11/16/2023   Medicare Annual Wellness (AWV)  01/21/2024   COLONOSCOPY (Pts 45-66yrs Insurance coverage will need to be confirmed)  12/31/2030   DTaP/Tdap/Td (3 - Tdap) 09/28/2032   Pneumonia Vaccine 50+ Years old  Completed   Hepatitis C Screening  Completed   Zoster Vaccines- Shingrix  Completed   HPV VACCINES  Aged Out   COVID-19 Vaccine  Discontinued    Health Maintenance  Health Maintenance Due  Topic Date Due   FOOT EXAM  12/20/2022    Colorectal cancer screening: Type of screening: Colonoscopy. Completed 12/30/20. Repeat every 10 years  Lung Cancer Screening: (Low Dose CT Chest recommended if Age 36-80 years, 30 pack-year currently smoking OR have quit w/in 15years.) does not qualify.     Additional Screening:  Hepatitis C Screening: does qualify; Completed 10/10/17  Vision Screening: Recommended annual ophthalmology exams for early detection of glaucoma and other disorders of the eye. Is the patient up to date with their annual eye exam?  Yes  Who is the provider or what is the name of the office in which the patient attends annual eye exams? Dr Allyne Gee If pt is not established with a provider, would they like to be referred to a provider to establish care? No .   Dental Screening: Recommended annual dental exams for proper oral hygiene  Community Resource Referral / Chronic Care Management: CRR required this visit?  No   CCM required this visit?  No      Plan:     I have personally reviewed and noted the following in the patient's chart:    Medical and social history Use of alcohol, tobacco or illicit drugs  Current medications and supplements including opioid prescriptions. Patient is not currently taking opioid prescriptions. Functional ability and status Nutritional status Physical activity Advanced directives List of other physicians Hospitalizations, surgeries, and ER visits in previous 12 months Vitals Screenings to include cognitive, depression, and falls Referrals and appointments  In addition, I have reviewed and discussed with patient certain preventive protocols, quality metrics, and best practice recommendations. A written personalized care plan for preventive services as well as general preventive health recommendations were provided to patient.     Tillie Rung, LPN   12/10/8117   Nurse Notes:  Patient due Diabetic kidney evaluation-eGFR measurement

## 2023-01-21 NOTE — Patient Instructions (Addendum)
[name] , Thank you for taking time to come for your Medicare Wellness Visit. I appreciate your ongoing commitment to your health goals. Please review the following plan we discussed and let me know if I can assist you in the future.   These are the goals we discussed:  Goals       Increase physical activity (pt-stated)      Patient Stated      Lose weight       Patient stated (pt-stated)      I want to get healthy        This is a list of the screening recommended for you and due dates:  Health Maintenance  Topic Date Due   Complete foot exam   12/20/2022   Yearly kidney function blood test for diabetes  04/23/2023*   Flu Shot  04/04/2023   Hemoglobin A1C  06/01/2023   Yearly kidney health urinalysis for diabetes  08/31/2023   Eye exam for diabetics  11/16/2023   Medicare Annual Wellness Visit  01/21/2024   Colon Cancer Screening  12/31/2030   DTaP/Tdap/Td vaccine (3 - Tdap) 09/28/2032   Pneumonia Vaccine  Completed   Hepatitis C Screening: USPSTF Recommendation to screen - Ages 30-79 yo.  Completed   Zoster (Shingles) Vaccine  Completed   HPV Vaccine  Aged Out   COVID-19 Vaccine  Discontinued  *Topic was postponed. The date shown is not the original due date.    Advanced directives: Please bring a copy of your health care power of attorney and living will to the office to be added to your chart at your convenience.   Conditions/risks identified: None  Next appointment: Follow up in one year for your annual wellness visit.   Preventive Care 37 Years and Older, Male  Preventive care refers to lifestyle choices and visits with your health care provider that can promote health and wellness. What does preventive care include? A yearly physical exam. This is also called an annual well check. Dental exams once or twice a year. Routine eye exams. Ask your health care provider how often you should have your eyes checked. Personal lifestyle choices, including: Daily  care of your teeth and gums. Regular physical activity. Eating a healthy diet. Avoiding tobacco and drug use. Limiting alcohol use. Practicing safe sex. Taking low doses of aspirin every day. Taking vitamin and mineral supplements as recommended by your health care provider. What happens during an annual well check? The services and screenings done by your health care provider during your annual well check will depend on your age, overall health, lifestyle risk factors, and family history of disease. Counseling  Your health care provider may ask you questions about your: Alcohol use. Tobacco use. Drug use. Emotional well-being. Home and relationship well-being. Sexual activity. Eating habits. History of falls. Memory and ability to understand (cognition). Work and work Astronomer. Screening  You may have the following tests or measurements: Height, weight, and BMI. Blood pressure. Lipid and cholesterol levels. These may be checked every 5 years, or more frequently if you are over 44 years old. Skin check. Lung cancer screening. You may have this screening every year starting at age 48 if you have a 30-pack-year history of smoking and currently smoke or have quit within the past 15 years. Fecal occult blood test (FOBT) of the stool. You may have this test every year starting at age 53. Flexible sigmoidoscopy or colonoscopy. You may have a sigmoidoscopy every 5 years or a colonoscopy  every 10 years starting at age 41. Prostate cancer screening. Recommendations will vary depending on your family history and other risks. Hepatitis C blood test. Hepatitis B blood test. Sexually transmitted disease (STD) testing. Diabetes screening. This is done by checking your blood sugar (glucose) after you have not eaten for a while (fasting). You may have this done every 1-3 years. Abdominal aortic aneurysm (AAA) screening. You may need this if you are a current or former smoker. Osteoporosis.  You may be screened starting at age 12 if you are at high risk. Talk with your health care provider about your test results, treatment options, and if necessary, the need for more tests. Vaccines  Your health care provider may recommend certain vaccines, such as: Influenza vaccine. This is recommended every year. Tetanus, diphtheria, and acellular pertussis (Tdap, Td) vaccine. You may need a Td booster every 10 years. Zoster vaccine. You may need this after age 13. Pneumococcal 13-valent conjugate (PCV13) vaccine. One dose is recommended after age 71. Pneumococcal polysaccharide (PPSV23) vaccine. One dose is recommended after age 19. Talk to your health care provider about which screenings and vaccines you need and how often you need them. This information is not intended to replace advice given to you by your health care provider. Make sure you discuss any questions you have with your health care provider. Document Released: 09/16/2015 Document Revised: 05/09/2016 Document Reviewed: 06/21/2015 Elsevier Interactive Patient Education  2017 [employer].  Fall Prevention in the Home Falls can cause injuries. They can happen to people of all ages. There are many things you can do to make your home safe and to help prevent falls. What can I do on the outside of my home? Regularly fix the edges of walkways and driveways and fix any cracks. Remove anything that might make you trip as you walk through a door, such as a raised step or threshold. Trim any bushes or trees on the path to your home. Use bright outdoor lighting. Clear any walking paths of anything that might make someone trip, such as rocks or tools. Regularly check to see if handrails are loose or broken. Make sure that both sides of any steps have handrails. Any raised decks and porches should have guardrails on the edges. Have any leaves, snow, or ice cleared regularly. Use sand or salt on walking paths during winter. Clean up any  spills in your garage right away. This includes oil or grease spills. What can I do in the bathroom? Use night lights. Install grab bars by the toilet and in the tub and shower. Do not use towel bars as grab bars. Use non-skid mats or decals in the tub or shower. If you need to sit down in the shower, use a plastic, non-slip stool. Keep the floor dry. Clean up any water that spills on the floor as soon as it happens. Remove soap buildup in the tub or shower regularly. Attach bath mats securely with double-sided non-slip rug tape. Do not have throw rugs and other things on the floor that can make you trip. What can I do in the bedroom? Use night lights. Make sure that you have a light by your bed that is easy to reach. Do not use any sheets or blankets that are too big for your bed. They should not hang down onto the floor. Have a firm chair that has side arms. You can use this for support while you get dressed. Do not have throw rugs and other things on  the floor that can make you trip. What can I do in the kitchen? Clean up any spills right away. Avoid walking on wet floors. Keep items that you use a lot in easy-to-reach places. If you need to reach something above you, use a strong step stool that has a grab bar. Keep electrical cords out of the way. Do not use floor polish or wax that makes floors slippery. If you must use wax, use non-skid floor wax. Do not have throw rugs and other things on the floor that can make you trip. What can I do with my stairs? Do not leave any items on the stairs. Make sure that there are handrails on both sides of the stairs and use them. Fix handrails that are broken or loose. Make sure that handrails are as long as the stairways. Check any carpeting to make sure that it is firmly attached to the stairs. Fix any carpet that is loose or worn. Avoid having throw rugs at the top or bottom of the stairs. If you do have throw rugs, attach them to the floor  with carpet tape. Make sure that you have a light switch at the top of the stairs and the bottom of the stairs. If you do not have them, ask someone to add them for you. What else can I do to help prevent falls? Wear shoes that: Do not have high heels. Have rubber bottoms. Are comfortable and fit you well. Are closed at the toe. Do not wear sandals. If you use a stepladder: Make sure that it is fully opened. Do not climb a closed stepladder. Make sure that both sides of the stepladder are locked into place. Ask someone to hold it for you, if possible. Clearly mark and make sure that you can see: Any grab bars or handrails. First and last steps. Where the edge of each step is. Use tools that help you move around (mobility aids) if they are needed. These include: Canes. Walkers. Scooters. Crutches. Turn on the lights when you go into a dark area. Replace any light bulbs as soon as they burn out. Set up your furniture so you have a clear path. Avoid moving your furniture around. If any of your floors are uneven, fix them. If there are any pets around you, be aware of where they are. Review your medicines with your doctor. Some medicines can make you feel dizzy. This can increase your chance of falling. Ask your doctor what other things that you can do to help prevent falls. This information is not intended to replace advice given to you by your health care provider. Make sure you discuss any questions you have with your health care provider. Document Released: 06/16/2009 Document Revised: 01/26/2016 Document Reviewed: 09/24/2014 Elsevier Interactive Patient Education  2017 [employer].

## 2023-01-29 DIAGNOSIS — N184 Chronic kidney disease, stage 4 (severe): Secondary | ICD-10-CM | POA: Diagnosis not present

## 2023-02-04 ENCOUNTER — Telehealth: Payer: Self-pay

## 2023-02-04 NOTE — Progress Notes (Deleted)
Patient ID: Russell Austin, male   DOB: Feb 02, 1951, 72 y.o.   MRN: [id number] Care Management & Coordination Services Pharmacy Team  Reason for Encounter: Hypertension  Contacted patient to discuss hypertension disease state. {US HC Outreach:28874}    Current antihypertensive regimen:  Amlodipine 10mg  1 qd  Chlorthalidone 25mg  1 qd  Losartan 100mg  1 qd Metoprolol tartrate 50mg  1 BID Torsemide 20mg  1 BID  Patient verbally confirms he is taking the above medications as directed. {yes/no:20286}  How often are you checking your Blood Pressure? {CHL HP BP Monitoring [phone]}  he checks his blood pressure {timing:25218} {before/after:25217} taking his medication.  Current home BP readings: ***  DATE:             BP               PULSE   Wrist or arm cuff: Caffeine intake: Salt intake: OTC medications including pseudoephedrine or NSAIDs?  Any readings above 180/100? {yes/no:20286} If yes any symptoms of hypertensive emergency? {hypertensive emergency symptoms:25354}  What recent interventions/DTPs have been made by any provider to improve Blood Pressure control since last CPP Visit: ***  Any recent hospitalizations or ED visits since last visit with CPP? {yes/no:20286}  What diet changes have been made to improve Blood Pressure Control?  ***  What exercise is being done to improve your Blood Pressure Control?  ***  Adherence Review: Is the patient currently on ACE/ARB medication? Yes Does the patient have >5 day gap between last estimated fill dates? No  Star Rating Drugs:  Losartan 100 mg - Last filled 01/29/23 90 DS at [facility] Atorvastatin 10 mg - Last filled 01/29/23 90 DS at [facility] Glipizide 2.5 mg - Last filled 5/28//24 90 DS at [facility]   Chart Updates: Recent office visits:  01/21/23 Tillie Rung, LPN - Patient presented for Medicare annual Wellness exam. Patient voiced goal of increased physical activity. No medication changers.    01/08/23 Nelwyn Salisbury, MD - Patient presented for Type 2 diabetes mellitus with diabetic neuropathy with long term current use of insulin and other concerns. No medication changes.   Recent consult visits:  None  Hospital visits:  None in previous 6 months  Medications: Outpatient Encounter Medications as of 02/04/2023  Medication Sig   amLODipine (NORVASC) 10 MG tablet Take 1 tablet (10 mg total) by mouth daily. (Patient taking differently: Take 5 mg by mouth daily.)   aspirin EC 81 MG tablet Take 81 mg by mouth daily.   atorvastatin (LIPITOR) 10 MG tablet TAKE ONE TABLET BY MOUTH EVERY MORNING   blood glucose meter kit and supplies KIT Dispense based on patient and insurance preference. Use up to four times daily as directed.   buPROPion (WELLBUTRIN XL) 300 MG 24 hr tablet TAKE ONE TABLET BY MOUTH EVERY MORNING   chlorthalidone (HYGROTON) 25 MG tablet Take 25 mg by mouth daily.   Continuous Blood Gluc Receiver (FREESTYLE LIBRE 14 DAY READER) DEVI Use with freestyle libre system   Continuous Blood Gluc Sensor (FREESTYLE LIBRE 3 SENSOR) MISC USE TO check blood glucose AS DIRECTED AND CHANGE sensor every 14 DAYS   gabapentin (NEURONTIN) 300 MG capsule TAKE ONE CAPSULE BY MOUTH EVERYDAY AT BEDTIME   glipiZIDE (GLUCOTROL XL) 2.5 MG 24 hr tablet TAKE ONE TABLET BY MOUTH EVERY MORNING and TAKE ONE TABLET BY MOUTH EVERY EVENING (Patient taking differently: Take 2.5 mg by mouth daily with breakfast.)   insulin glargine (LANTUS SOLOSTAR) 100 UNIT/ML Solostar Pen Inject 10 Units into  the skin daily.   Insulin Pen Needle (B-D ULTRAFINE III SHORT PEN) 31G X 8 MM MISC USE WITH LANUTS PEN   Lancets (ONETOUCH DELICA PLUS LANCET33G) MISC Apply topically.   losartan (COZAAR) 100 MG tablet TAKE ONE TABLET BY MOUTH EVERY MORNING   metoprolol tartrate (LOPRESSOR) 50 MG tablet TAKE ONE TABLET BY MOUTH EVERY MORNING and TAKE ONE TABLET BY MOUTH EVERYDAY AT BEDTIME   ONETOUCH ULTRA test strip USE TO check  blood glucose UP TO four times daily AS DIRECTED   tiZANidine (ZANAFLEX) 2 MG tablet TAKE ONE TABLET BY MOUTH EVERYDAY AT BEDTIME   torsemide (DEMADEX) 20 MG tablet Take 20 mg by mouth 2 (two) times daily.   No facility-administered encounter medications on file as of 02/04/2023.    Recent Office Vitals: BP Readings from Last 3 Encounters:  01/08/23 128/78  11/29/22 (!) 170/100  08/30/22 130/74   Pulse Readings from Last 3 Encounters:  01/08/23 72  11/29/22 67  08/30/22 65    Wt Readings from Last 3 Encounters:  01/21/23 174 lb (78.9 kg)  01/08/23 174 lb (78.9 kg)  11/29/22 174 lb (78.9 kg)     Kidney Function Lab Results  Component Value Date/Time   CREATININE 3.52 (H) 12/19/2021 07:41 AM   CREATININE 3.82 (H) 12/09/2020 08:04 AM   GFR 16.87 (L) 12/19/2021 07:41 AM   GFRNONAA 41 (L) 10/28/2018 09:12 AM   GFRAA 47 (L) 10/28/2018 09:12 AM       Latest Ref Rng & Units 12/19/2021    7:41 AM 12/09/2020    8:04 AM 11/11/2019    7:44 AM  BMP  Glucose 70 - 99 mg/dL 161  95  096   BUN 6 - 23 mg/dL 44  53  31   Creatinine 0.40 - 1.50 mg/dL 0.45  4.09  8.11   Sodium 135 - 145 mEq/L 140  136  139   Potassium 3.5 - 5.1 mEq/L 4.9  4.2  4.7   Chloride 96 - 112 mEq/L 108  103  107   CO2 19 - 32 mEq/L 25  26  28    Calcium 8.4 - 10.5 mg/dL 9.0  8.6  8.7        Pamala Duffel [facility] Clinical Pharmacist Assistant [phone]

## 2023-02-08 DIAGNOSIS — E785 Hyperlipidemia, unspecified: Secondary | ICD-10-CM | POA: Diagnosis not present

## 2023-02-08 DIAGNOSIS — I129 Hypertensive chronic kidney disease with stage 1 through stage 4 chronic kidney disease, or unspecified chronic kidney disease: Secondary | ICD-10-CM | POA: Diagnosis not present

## 2023-02-08 DIAGNOSIS — E1122 Type 2 diabetes mellitus with diabetic chronic kidney disease: Secondary | ICD-10-CM | POA: Diagnosis not present

## 2023-02-08 DIAGNOSIS — I639 Cerebral infarction, unspecified: Secondary | ICD-10-CM | POA: Diagnosis not present

## 2023-02-08 DIAGNOSIS — N184 Chronic kidney disease, stage 4 (severe): Secondary | ICD-10-CM | POA: Diagnosis not present

## 2023-02-08 DIAGNOSIS — D631 Anemia in chronic kidney disease: Secondary | ICD-10-CM | POA: Diagnosis not present

## 2023-02-08 DIAGNOSIS — N2581 Secondary hyperparathyroidism of renal origin: Secondary | ICD-10-CM | POA: Diagnosis not present

## 2023-02-11 ENCOUNTER — Telehealth: Payer: Self-pay | Admitting: Adult Health

## 2023-02-11 NOTE — Telephone Encounter (Signed)
Prescription Request  02/11/2023  LOV: 11/29/2022  What is the name of the medication or equipment?  Continuous Blood Gluc Sensor (FREESTYLE LIBRE 3 SENSOR) MISC   Have you contacted your pharmacy to request a refill? No   Which pharmacy would you like this sent to?  [facility] - [place], [place] - [address] [place] [address] Phone: [phone] Fax: [phone]    Patient notified that their request is being sent to the clinical staff for review and that they should receive a response within 2 business days.   Please advise at Mobile [phone] (mobile)

## 2023-02-12 MED ORDER — FREESTYLE LIBRE 3 SENSOR MISC: 1 refills | Status: DC

## 2023-02-12 NOTE — Addendum Note (Signed)
Addended by: Waymon Amato R on: 02/12/2023 08:39 AM   Modules accepted: Orders

## 2023-02-12 NOTE — Telephone Encounter (Signed)
Rx refilled.

## 2023-02-13 ENCOUNTER — Ambulatory Visit: Payer: PPO | Admitting: Podiatry

## 2023-02-13 ENCOUNTER — Ambulatory Visit (INDEPENDENT_AMBULATORY_CARE_PROVIDER_SITE_OTHER): Payer: PPO | Admitting: Podiatry

## 2023-02-13 ENCOUNTER — Encounter: Payer: Self-pay | Admitting: Podiatry

## 2023-02-13 DIAGNOSIS — G6289 Other specified polyneuropathies: Secondary | ICD-10-CM

## 2023-02-13 DIAGNOSIS — E1140 Type 2 diabetes mellitus with diabetic neuropathy, unspecified: Secondary | ICD-10-CM

## 2023-02-13 DIAGNOSIS — Z794 Long term (current) use of insulin: Secondary | ICD-10-CM | POA: Diagnosis not present

## 2023-02-13 NOTE — Progress Notes (Signed)
  Subjective:  Patient ID: Russell Austin, male    DOB: 07/15/1951,   MRN: [id number]  Chief Complaint  Patient presents with   Foot Pain    Bilateral heel pain is associated with neuropathy ; patient states pain is not constant     72 y.o. male presents for new concern of bilateral heel pain that has been worsening over the past month or so. Relates it comes and goes usually at night. Relates burning and tingling and shooting pains to his heel and up his leg. He is diabetic and on gabapentin. He also relates a history of back surgery and recently his back has been more painful . Denies any other pedal complaints. Denies n/v/f/c.   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 CFT <3 seconds. Normal hair growth on digits. No edema.  Skin. No lacerations or abrasions bilateral feet. Nails 1-5 bilateral are thickened and discolored with subungual debris.  Musculoskeletal: MMT 5/5 bilateral lower extremities in DF, PF, Inversion and Eversion. Deceased ROM in DF of ankle joint. No pain to palpation about the foot or ankle  Neurological: Sensation intact to light touch. Protective sensation diminished.   Assessment:   1. Type 2 diabetes mellitus with diabetic neuropathy, with long-term current use of insulin (HCC)   2. Other polyneuropathy      Plan:  Patient was evaluated and treated and all questions answered. Discussed neuropathy and etiology as well as treatment with patient.  Radiographs reviewed and discussed with patient.  -Discussed and educated patient on diabetic foot care, especially with  regards to the vascular, neurological and musculoskeletal systems.  -Stressed the  importance of good glycemic control and the detriment of not  controlling glucose levels in relation to the foot. -Discussed supportive shoes at all times and checking feet regularly.  -Follow-up with PCP for prescription management of gabapentin. Discussed bumping up the dosage -Discussed this pain could potentially coming from the back. Advised to follow-up with this to rule this out as a possibility.  -Patient to return as scheduled for rfc.    Louann Sjogren, DPM

## 2023-02-18 ENCOUNTER — Ambulatory Visit (INDEPENDENT_AMBULATORY_CARE_PROVIDER_SITE_OTHER): Payer: PPO | Admitting: Podiatry

## 2023-02-18 ENCOUNTER — Telehealth: Payer: HMO

## 2023-02-18 DIAGNOSIS — E1140 Type 2 diabetes mellitus with diabetic neuropathy, unspecified: Secondary | ICD-10-CM | POA: Diagnosis not present

## 2023-02-18 DIAGNOSIS — M21611 Bunion of right foot: Secondary | ICD-10-CM

## 2023-02-18 DIAGNOSIS — M2042 Other hammer toe(s) (acquired), left foot: Secondary | ICD-10-CM

## 2023-02-18 DIAGNOSIS — M2041 Other hammer toe(s) (acquired), right foot: Secondary | ICD-10-CM | POA: Diagnosis not present

## 2023-02-18 DIAGNOSIS — M21612 Bunion of left foot: Secondary | ICD-10-CM

## 2023-02-18 DIAGNOSIS — Z794 Long term (current) use of insulin: Secondary | ICD-10-CM

## 2023-02-18 NOTE — Progress Notes (Unsigned)
Patient presents today to pick up diabetic shoes and insoles.  Patient was dispensed 1 pair of diabetic shoes and 3 pairs of foam casted diabetic insoles. Fit was satisfactory. Instructions for break-in and wear was reviewed and a copy was given to the patient.   Re-appointment for regularly scheduled diabetic foot care visits or if they should experience any trouble with the shoes or insoles.  

## 2023-02-27 NOTE — Progress Notes (Unsigned)
Subjective:    Patient ID: Russell Austin, male    DOB: 01-26-1951, 72 y.o.   MRN: [id number]  HPI Patient presents for yearly preventative medicine examination. 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  DM II -he is currently managed with Lantus 10 units daily and glipizide 2.5 mg daily.  He does use the libre sensor to manage his blood sugars with readings.  Does lead a pretty sedentary lifestyle.  During his last visit he was reporting blood sugar readings in the 60s overnight.  He was not taking his insulin when his blood sugars were this low.  We decided to use his glipizide from twice daily dosing to daily dosing in the morning.   Lab Results  Component Value Date   HGBA1C 7.5 (A) 11/29/2022   Hypertension-managed with Norvasc 5 mg daily, losartan 100 mg daily, chlorthalidone 12.5 mg daily, Torsemide 20 mg BID, and metoprolol 50 mg twice daily.  He denies dizziness, lightheadedness, chest pain, shortness of breath, or syncopal episodes.  He does monitor his blood pressures at home with readings in the 130s to 140s over 70s to 80s. He did not take his medication this morning  BP Readings from Last 3 Encounters:  02/28/23 138/70  01/08/23 128/78  11/29/22 (!) 170/100   Hyperlipidemia-managed with Lipitor 10 mg daily.  He denies myalgia or fatigue Lab Results  Component Value Date   CHOL 118 12/19/2021   HDL 31.60 (L) 12/19/2021   LDLCALC 62 12/19/2021   TRIG 123.0 12/19/2021   CHOLHDL 4 12/19/2021    Lower extremity edema-Currently managed with Torsemide 20 mg BID. His legs continues to be edematous. He is not elevating legs at rest. He has developed multiple small " blisters" on his lower extremities that have popped. His  wife is dressing with neosporin and bandaid.    History of CVA-in June 2016.  Has right-sided hemiplegia.  Been prescribed gabapentin 300 mg at bedtime and tizanidine 2 mg nightly. He does take statin and ASA daily.   BPH-asymptomatic  CKD Stage IV - managed by nephrology. He is in the process of getting set up for PD.   Anemia of chronic renal failure- hemoglobin level usually around 10. He is on daily iron supplement.   All immunizations and health maintenance protocols were reviewed with the patient and needed orders were placed.  Appropriate screening laboratory values were ordered for the patient including screening of hyperlipidemia, renal function and hepatic function. If indicated by BPH, a PSA was ordered.  Medication reconciliation,  past medical history, social history, problem list and allergies were reviewed in detail with the patient  Goals were established with regard to weight loss, exercise, and  diet in compliance with medications. He does not exercise and leads a sedentary lifestyle  Review of Systems  Constitutional: Negative.   HENT: Negative.    Eyes: Negative.   Respiratory: Negative.    Cardiovascular:  Positive for leg swelling.  Gastrointestinal: Negative.   Endocrine: Negative.   Genitourinary: Negative.   Musculoskeletal:  Positive for arthralgias and gait problem.  Skin:  Positive for wound.  Allergic/Immunologic: Negative.   Neurological:  Positive for weakness.  Hematological: Negative.   Psychiatric/Behavioral: Negative.    All other systems reviewed and are negative.  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   Diabetes mellitus type II    Diabetic retinopathy FOLLOWED BY DR Luciana Axe   ED (erectile dysfunction)    Glaucoma of both eyes    Hypertension    Left hydrocele    Stroke [facility])     Social History   Socioeconomic History   Marital status: Married    Spouse name: Not on  file   Number of children: 1   Years of education: 12   Highest education level: 12th grade  Occupational History   Occupation: Disabled  Tobacco Use   Smoking status: Never   Smokeless tobacco: Never  Substance and Sexual Activity   Alcohol use: No   Drug use: No   Sexual activity: Not on file  Other Topics Concern   Not on file  Social History Narrative   Lives at home with his wife and granddaughter.   Left-handed.   3 cups caffeine per day.   Social Determinants of Health   Financial Resource Strain: Low Risk  (01/21/2023)   Overall Financial Resource Strain (CARDIA)    Difficulty of Paying Living Expenses: Not hard at all  Food Insecurity: No Food Insecurity (01/21/2023)   Hunger Vital Sign    Worried About Running Out of Food in the Last Year: Never true    Ran Out of Food in the Last Year: Never true  Transportation Needs: No Transportation Needs (01/21/2023)   PRAPARE - Administrator, Civil Service (Medical): No    Lack of Transportation (Non-Medical): No  Physical Activity: Inactive (01/21/2023)   Exercise Vital Sign    Days of Exercise per Week: 0 days    Minutes of Exercise per Session: 0 min  Stress: No Stress Concern Present (01/21/2023)   [employer] of Occupational Health - Occupational Stress Questionnaire    Feeling of Stress : Not at all  Social Connections: Socially Integrated (01/21/2023)   Social Connection and Isolation Panel [NHANES]    Frequency of Communication with Friends and Family: More than three times a week    Frequency of Social Gatherings with Friends and Family: More than three times a week    Attends Religious Services: More than 4 times per year    Active Member of Clubs or Organizations: Yes    Attends Banker Meetings: More than 4 times per year    Marital Status: Married  Catering manager Violence: Not At Risk (01/21/2023)   Humiliation, Afraid, Rape, and Kick questionnaire    Fear of Current or  Ex-Partner: No    Emotionally Abused: No    Physically Abused: No    Sexually Abused: No    Past Surgical History:  Procedure Laterality Date   APPENDECTOMY  AGE EARLY 20'S   COLONOSCOPY  12/30/2020   every 5 years   HYDROCELE EXCISION  03/31/2012   Procedure: HYDROCELECTOMY ADULT;  Surgeon: Marcine Matar, MD;  Location: [facility];  Service: Urology;  Laterality: Left;  45 MINS     LEFT EYE LASER RETINA REPAIR  SEPT 2012   RIGHT EYE VITRECTOMY/ INSERTION GLAUCOMA SETON/ LASER REPAIR  12-13-2008   RETINAL ARTERY OCCLUSION /NEOVASCULAR GLAUCOMA/ HEMORRHAGE   RIGHT EYE VITRETOMY/ INSERTION GLAUCOMA SETON X2/ LASER  03-24-2009   RECURRENT HEMORRHAGE/ OCCLUSION INTERNAL SETON   SHOULDER ARTHROSCOPY Right 2005   undescended right testicle removed  1994    Family History  Problem Relation Age  of Onset   Glaucoma Mother    Diabetes Mother    Stomach cancer Maternal Grandfather    Stroke Maternal Grandmother     Allergies  Allergen Reactions   Lactose Intolerance (Gi) Diarrhea   Apple Pectin [Pectin] Itching    ITCHY THROAT   Lactose Other (See Comments)   Metformin And Related     D/t decreased kidney function    Peach [Prunus Persica] Itching    ITCHY THROAT   Morphine And Codeine Anxiety    Jittery    Current Outpatient Medications on File Prior to Visit  Medication Sig Dispense Refill   amLODipine (NORVASC) 10 MG tablet Take 1 tablet (10 mg total) by mouth daily. (Patient taking differently: Take 5 mg by mouth daily.) 90 tablet 2   aspirin EC 81 MG tablet Take 81 mg by mouth daily.     atorvastatin (LIPITOR) 10 MG tablet TAKE ONE TABLET BY MOUTH EVERY MORNING 90 tablet 3   blood glucose meter kit and supplies KIT Dispense based on patient and insurance preference. Use up to four times daily as directed. 1 each 0   buPROPion (WELLBUTRIN XL) 300 MG 24 hr tablet TAKE ONE TABLET BY MOUTH EVERY MORNING 90 tablet 1   chlorthalidone (HYGROTON) 25 MG tablet  Take 25 mg by mouth daily.     Continuous Blood Gluc Receiver (FREESTYLE LIBRE 14 DAY READER) DEVI Use with freestyle libre system 1 each 0   Continuous Glucose Sensor (FREESTYLE LIBRE 3 SENSOR) MISC USE TO check blood glucose AS DIRECTED AND CHANGE sensor every 14 DAYS 1 each 1   gabapentin (NEURONTIN) 300 MG capsule TAKE ONE CAPSULE BY MOUTH EVERYDAY AT BEDTIME 90 capsule 3   glipiZIDE (GLUCOTROL XL) 2.5 MG 24 hr tablet TAKE ONE TABLET BY MOUTH EVERY MORNING and TAKE ONE TABLET BY MOUTH EVERY EVENING (Patient taking differently: Take 2.5 mg by mouth daily with breakfast.) 180 tablet 3   Insulin Pen Needle (B-D ULTRAFINE III SHORT PEN) 31G X 8 MM MISC USE WITH LANUTS PEN 100 each 3   Lancets (ONETOUCH DELICA PLUS LANCET33G) MISC Apply topically.     losartan (COZAAR) 100 MG tablet TAKE ONE TABLET BY MOUTH EVERY MORNING 90 tablet 1   metoprolol tartrate (LOPRESSOR) 50 MG tablet TAKE ONE TABLET BY MOUTH EVERY MORNING and TAKE ONE TABLET BY MOUTH EVERYDAY AT BEDTIME 180 tablet 3   ONETOUCH ULTRA test strip USE TO check blood glucose UP TO four times daily AS DIRECTED     tiZANidine (ZANAFLEX) 2 MG tablet TAKE ONE TABLET BY MOUTH EVERYDAY AT BEDTIME 90 tablet 3   torsemide (DEMADEX) 20 MG tablet Take 20 mg by mouth 2 (two) times daily.     insulin glargine (LANTUS SOLOSTAR) 100 UNIT/ML Solostar Pen Inject 10 Units into the skin daily. 9 mL 0   No current facility-administered medications on file prior to visit.    BP 138/70   Pulse 75   Temp 98.1 F (36.7 C) (Oral)   Ht 5\' 9"  (1.753 m)   Wt 173 lb (78.5 kg)   SpO2 94%   BMI 25.55 kg/m       Objective:   Physical Exam Vitals and nursing note reviewed.  Constitutional:      Appearance: Normal appearance.  HENT:     Right Ear: Tympanic membrane, ear canal and external ear normal.     Left Ear: Tympanic membrane, ear canal and external ear normal.     Nose: Nose normal.  Mouth/Throat:     Mouth: Mucous membranes are moist.      Pharynx: Oropharynx is clear.  Cardiovascular:     Rate and Rhythm: Normal rate and regular rhythm.     Pulses: Normal pulses.     Heart sounds: Normal heart sounds.  Pulmonary:     Effort: Pulmonary effort is normal.     Breath sounds: Normal breath sounds.  Abdominal:     General: Abdomen is flat. Bowel sounds are normal.     Palpations: Abdomen is soft.  Musculoskeletal:        General: Normal range of motion.  Skin:    General: Skin is warm and dry.     Comments: Large open blister noted on right lower extremity and a small open blister noted on left lower extremity. The wound on the right leg has some localized erythema and scant discolored drainage. No signs of infection with wound on left lower leg  Neurological:     General: No focal deficit present.     Mental Status: He is alert and oriented to person, place, and time.     Motor: Weakness present.     Gait: Gait abnormal.  Psychiatric:        Mood and Affect: Mood normal.        Behavior: Behavior normal.        Thought Content: Thought content normal.        Judgment: Judgment normal.           Assessment & Plan:  1. Routine general medical examination at a health care facility Today patient counseled on age appropriate routine health concerns for screening and prevention, each reviewed and up to date or declined. Immunizations reviewed and up to date or declined. Labs ordered and reviewed. Risk factors for depression reviewed and negative. Hearing function and visual acuity are intact. ADLs screened and addressed as needed. Functional ability and level of safety reviewed and appropriate. Education, counseling and referrals performed based on assessed risks today. Patient provided with a copy of personalized plan for preventive services.   2. Type 2 diabetes mellitus with diabetic neuropathy, with long-term current use of insulin (HCC) - Consider adding Farxiga  - Follow up in 3 months  - Lipid panel; Future -  TSH; Future - CBC; Future - Comprehensive metabolic panel; Future - Comprehensive metabolic panel - CBC - TSH - Lipid panel - Hemoglobin A1c; Future 3. Essential hypertension - At goal - No change in medication  - Lipid panel; Future - TSH; Future - CBC; Future - Comprehensive metabolic panel; Future - Comprehensive metabolic panel - CBC - TSH - Lipid panel - amLODipine (NORVASC) 5 MG tablet; Take 1 tablet (5 mg total) by mouth daily.  Dispense: 90 tablet; Refill: 3  4. Prostate cancer screening  - PSA; Future - PSA  5. Hyperlipidemia, unspecified hyperlipidemia type - Consider increase ins statin  - Lipid panel; Future - TSH; Future - CBC; Future - Comprehensive metabolic panel; Future - Comprehensive metabolic panel - CBC - TSH - Lipid panel  6. Stasis edema of both lower extremities =- Continue Torsemide 20 mg BID  - Elevated legs at rest - Lipid panel; Future - TSH; Future - CBC; Future - Comprehensive metabolic panel; Future - Comprehensive metabolic panel - CBC - TSH - Lipid panel  7. History of CVA (cerebrovascular accident) - Continue ASA and statin  - Lipid panel; Future - TSH; Future - CBC; Future - Comprehensive metabolic panel; Future -  Comprehensive metabolic panel - CBC - TSH - Lipid panel  8. CKD (chronic kidney disease) stage 5, GFR less than 15 ml/min (HCC) - Per nephrology  - Lipid panel; Future - TSH; Future - CBC; Future - Comprehensive metabolic panel; Future - Comprehensive metabolic panel - CBC - TSH - Lipid panel  9. Anemia of chronic renal failure, stage 5 (HCC)  - Lipid panel; Future - TSH; Future - CBC; Future - Comprehensive metabolic panel; Future - Comprehensive metabolic panel - CBC - TSH - Lipid panel  10. Open wound - Will start on Doxycycline for preventative measures.  - Keep wound covered with clean dressings - Follow up with signs of infection  - CBC; Future - doxycycline (VIBRAMYCIN) 100 MG  capsule; Take 1 capsule (100 mg total) by mouth 2 (two) times daily.  Dispense: 14 capsule; Refill: 0  Shirline Frees, NP

## 2023-02-28 ENCOUNTER — Ambulatory Visit (INDEPENDENT_AMBULATORY_CARE_PROVIDER_SITE_OTHER): Payer: PPO | Admitting: Adult Health

## 2023-02-28 ENCOUNTER — Telehealth: Payer: Self-pay | Admitting: *Deleted

## 2023-02-28 ENCOUNTER — Ambulatory Visit (INDEPENDENT_AMBULATORY_CARE_PROVIDER_SITE_OTHER): Payer: PPO

## 2023-02-28 ENCOUNTER — Encounter: Payer: Self-pay | Admitting: Adult Health

## 2023-02-28 VITALS — BP 138/70 | HR 75 | Temp 98.1°F | Ht 69.0 in | Wt 173.0 lb

## 2023-02-28 DIAGNOSIS — E1140 Type 2 diabetes mellitus with diabetic neuropathy, unspecified: Secondary | ICD-10-CM

## 2023-02-28 DIAGNOSIS — I10 Essential (primary) hypertension: Secondary | ICD-10-CM | POA: Diagnosis not present

## 2023-02-28 DIAGNOSIS — Z794 Long term (current) use of insulin: Secondary | ICD-10-CM | POA: Diagnosis not present

## 2023-02-28 DIAGNOSIS — Z0000 Encounter for general adult medical examination without abnormal findings: Secondary | ICD-10-CM | POA: Diagnosis not present

## 2023-02-28 DIAGNOSIS — E785 Hyperlipidemia, unspecified: Secondary | ICD-10-CM | POA: Diagnosis not present

## 2023-02-28 DIAGNOSIS — I87303 Chronic venous hypertension (idiopathic) without complications of bilateral lower extremity: Secondary | ICD-10-CM

## 2023-02-28 DIAGNOSIS — Z8673 Personal history of transient ischemic attack (TIA), and cerebral infarction without residual deficits: Secondary | ICD-10-CM | POA: Diagnosis not present

## 2023-02-28 DIAGNOSIS — N185 Chronic kidney disease, stage 5: Secondary | ICD-10-CM

## 2023-02-28 DIAGNOSIS — D631 Anemia in chronic kidney disease: Secondary | ICD-10-CM

## 2023-02-28 DIAGNOSIS — T148XXA Other injury of unspecified body region, initial encounter: Secondary | ICD-10-CM

## 2023-02-28 DIAGNOSIS — Z125 Encounter for screening for malignant neoplasm of prostate: Secondary | ICD-10-CM

## 2023-02-28 LAB — CBC
HCT: 31 % — ABNORMAL LOW (ref 39.0–52.0)
Hemoglobin: 10 g/dL — ABNORMAL LOW (ref 13.0–17.0)
MCHC: 32.4 g/dL (ref 30.0–36.0)
MCV: 93.1 fl (ref 78.0–100.0)
Platelets: 283 10*3/uL (ref 150.0–400.0)
RBC: 3.33 Mil/uL — ABNORMAL LOW (ref 4.22–5.81)
RDW: 14.1 % (ref 11.5–15.5)
WBC: 5.3 10*3/uL (ref 4.0–10.5)

## 2023-02-28 LAB — COMPREHENSIVE METABOLIC PANEL
ALT: 41 U/L (ref 0–53)
AST: 27 U/L (ref 0–37)
Albumin: 3.7 g/dL (ref 3.5–5.2)
Alkaline Phosphatase: 90 U/L (ref 39–117)
BUN: 107 mg/dL (ref 6–23)
CO2: 26 mEq/L (ref 19–32)
Calcium: 8.9 mg/dL (ref 8.4–10.5)
Chloride: 102 mEq/L (ref 96–112)
Creatinine, Ser: 5.18 mg/dL (ref 0.40–1.50)
GFR: 10.52 mL/min — CL (ref >60.00)
Glucose, Bld: 186 mg/dL — ABNORMAL HIGH (ref 70–99)
Potassium: 4.4 mEq/L (ref 3.5–5.1)
Sodium: 139 mEq/L (ref 135–145)
Total Bilirubin: 0.9 mg/dL (ref 0.2–1.2)
Total Protein: 6.9 g/dL (ref 6.0–8.3)

## 2023-02-28 LAB — LIPID PANEL
Cholesterol: 90 mg/dL (ref 0–200)
HDL: 26.3 mg/dL — ABNORMAL LOW (ref >39.00)
LDL Cholesterol: 46 mg/dL (ref 0–99)
NonHDL: 63.44
Total CHOL/HDL Ratio: 3
Triglycerides: 88 mg/dL (ref 0.0–149.0)
VLDL: 17.6 mg/dL (ref 0.0–40.0)

## 2023-02-28 LAB — HEMOGLOBIN A1C: Hgb A1c MFr Bld: 8.1 % — ABNORMAL HIGH (ref 4.6–6.5)

## 2023-02-28 LAB — TSH: TSH: 2.4 u[IU]/mL (ref 0.35–5.50)

## 2023-02-28 LAB — PSA: PSA: 2.51 ng/mL (ref 0.10–4.00)

## 2023-02-28 MED ORDER — AMLODIPINE BESYLATE 5 MG PO TABS
5.0000 mg | ORAL_TABLET | Freq: Every day | ORAL | 3 refills | Status: DC
Start: 2023-02-28 — End: 2023-06-11

## 2023-02-28 MED ORDER — DOXYCYCLINE HYCLATE 100 MG PO CAPS
100.0000 mg | ORAL_CAPSULE | Freq: Two times a day (BID) | ORAL | 0 refills | Status: DC
Start: 2023-02-28 — End: 2023-04-05

## 2023-02-28 NOTE — Patient Instructions (Signed)
Health Maintenance Due  Topic Date Due   FOOT EXAM  12/20/2022      Row Labels 01/21/2023   12:40 PM 01/08/2023    8:36 AM 11/29/2022    7:12 AM  Depression screen PHQ 2/9   Section Header. No data exists in this row.     Decreased Interest   0 1 0  Down, Depressed, Hopeless   0 0 0  PHQ - 2 Score   0 1 0  Altered sleeping   0 3 1  Tired, decreased energy   0 3 2  Change in appetite   0 2 0  Feeling bad or failure about yourself    0 1 0  Trouble concentrating   0 0 1  Moving slowly or fidgety/restless   0 3 2  Suicidal thoughts   0 1 1  PHQ-9 Score   0 14 7  Difficult doing work/chores   Not difficult at all Somewhat difficult Not difficult at all

## 2023-02-28 NOTE — Telephone Encounter (Signed)
Noted  

## 2023-02-28 NOTE — Telephone Encounter (Signed)
CRITICAL VALUE STICKER  CRITICAL VALUE: BUN-107; creatinine 5.18 and GFR 10.52  RECEIVER (on-site recipient of call): Ronnald Collum  DATE & TIME NOTIFIED: 02/28/2023 at 11:35am  MESSENGER (representative from lab): [facility]  MD NOTIFIED:Cory Nafziger, NP  TIME OF NOTIFICATION:11:35am  RESPONSE:

## 2023-03-18 ENCOUNTER — Encounter: Payer: Self-pay | Admitting: Podiatry

## 2023-03-18 ENCOUNTER — Ambulatory Visit (INDEPENDENT_AMBULATORY_CARE_PROVIDER_SITE_OTHER): Payer: PPO | Admitting: Podiatry

## 2023-03-18 DIAGNOSIS — M79675 Pain in left toe(s): Secondary | ICD-10-CM | POA: Diagnosis not present

## 2023-03-18 DIAGNOSIS — R600 Localized edema: Secondary | ICD-10-CM | POA: Diagnosis not present

## 2023-03-18 DIAGNOSIS — E1140 Type 2 diabetes mellitus with diabetic neuropathy, unspecified: Secondary | ICD-10-CM

## 2023-03-18 DIAGNOSIS — B351 Tinea unguium: Secondary | ICD-10-CM

## 2023-03-18 DIAGNOSIS — N184 Chronic kidney disease, stage 4 (severe): Secondary | ICD-10-CM | POA: Diagnosis not present

## 2023-03-18 DIAGNOSIS — L84 Corns and callosities: Secondary | ICD-10-CM

## 2023-03-18 DIAGNOSIS — D631 Anemia in chronic kidney disease: Secondary | ICD-10-CM | POA: Diagnosis not present

## 2023-03-18 DIAGNOSIS — Z794 Long term (current) use of insulin: Secondary | ICD-10-CM

## 2023-03-18 DIAGNOSIS — E1122 Type 2 diabetes mellitus with diabetic chronic kidney disease: Secondary | ICD-10-CM | POA: Diagnosis not present

## 2023-03-18 DIAGNOSIS — M79674 Pain in right toe(s): Secondary | ICD-10-CM | POA: Diagnosis not present

## 2023-03-18 DIAGNOSIS — I129 Hypertensive chronic kidney disease with stage 1 through stage 4 chronic kidney disease, or unspecified chronic kidney disease: Secondary | ICD-10-CM | POA: Diagnosis not present

## 2023-03-18 DIAGNOSIS — N2581 Secondary hyperparathyroidism of renal origin: Secondary | ICD-10-CM | POA: Diagnosis not present

## 2023-03-18 NOTE — Progress Notes (Signed)
  Subjective:  Patient ID: Russell Austin, male    DOB: Oct 09, 1950,   MRN: [id number]  Chief Complaint  Patient presents with   Nail Problem    DFC    72 y.o. male presents for concern of thickened elongated and painful nails that are difficult to trim. Requesting to have them trimmed today. Relates burning and tingling in their feet. Patient is diabetic and last A1c was  Lab Results  Component Value Date   HGBA1C 8.1 (H) 02/28/2023   .   PCP:  Shirline Frees, NP    . Denies any other pedal complaints. Denies n/v/f/c.   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)     Objective:  Physical Exam: Vascular: DP/PT pulses 1/4 bilateral. CFT <3 seconds. Absent hair growth on digits. Edema noted to bilateral lower extremities. Xerosis noted bilaterally.  Skin. No lacerations or abrasions bilateral feet. Nails 1-5 bilateral  are thickened discolored and elongated with subungual debris. Hyperkeratotic lesion noted plantar left heel and posterior right heel.  Musculoskeletal: MMT 5/5 bilateral lower extremities in DF, PF, Inversion and Eversion. Deceased ROM in DF of ankle joint. Mild HAV deformity bilateral and hammered digits 2-5 bilateral.  Neurological: Sensation intact to light touch. Protective sensation diminished bilateral.    Assessment:   1. Pain due to onychomycosis of toenails of both feet   2. Type 2 diabetes mellitus with diabetic neuropathy, with long-term current use of insulin (HCC)   3. Callus       Plan:  Patient was evaluated and treated and all questions answered. -Discussed and educated patient on diabetic foot care, especially with  regards to the vascular, neurological and musculoskeletal systems.  -Stressed the importance  of good glycemic control and the detriment of not  controlling glucose levels in relation to the foot. -Discussed supportive shoes at all times and checking feet regularly.  -Mechanically debrided all nails 1-5 bilateral using sterile nail nipper and filed with dremel without incident  -Hyperkeratotc area debrided with chisel without incident.  -Awaiting DM shoes.  -Answered all patient questions -Patient to return  in 3 months for at risk foot care -Patient advised to call the office if any problems or questions arise in the meantime.   Louann Sjogren, DPM

## 2023-03-27 ENCOUNTER — Other Ambulatory Visit: Payer: Self-pay | Admitting: Adult Health

## 2023-03-27 DIAGNOSIS — E1140 Type 2 diabetes mellitus with diabetic neuropathy, unspecified: Secondary | ICD-10-CM

## 2023-04-01 NOTE — H&P (View-Only) (Signed)
[facility] [place]  ASSESSMENT / PLAN: Russell Austin is a 72 y.o. left handed male in need of permanent dialysis access. I reviewed options for dialysis in detail with the patient, including hemodialysis and peritoneal dialysis. I counseled the patient to ask their nephrologist about their candidacy for renal transplant. I counseled the patient that dialysis access requires surveillance and periodic maintenance. Plan to proceed with laparoscopic peritoneal dialysis catheter placement.  CHIEF COMPLAINT: CKD  HISTORY OF PRESENT ILLNESS: Russell Austin is a 72 y.o. male referred to clinic for evaluation of laparoscopic peritoneal dialysis catheter placement.  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  Patient is left-handed.  He has never had dialysis access before.  He has had an appendectomy before.  No other abdominal surgery.  We reviewed the benefits and drawbacks of peritoneal dialysis.  We reviewed the failure mechanisms of this modality.   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   Diabetes mellitus type II    Diabetic retinopathy FOLLOWED BY DR Luciana Axe   ED (erectile dysfunction)    Glaucoma of both eyes    Hypertension    Left hydrocele    Stroke [facility])     Past Surgical History:  Procedure Laterality Date   APPENDECTOMY  AGE EARLY 20'S   COLONOSCOPY  12/30/2020   every 5 years   HYDROCELE EXCISION  03/31/2012   Procedure: HYDROCELECTOMY ADULT;  Surgeon: Marcine Matar, MD;  Location: [facility];  Service: Urology;  Laterality: Left;  45 MINS     LEFT EYE LASER RETINA REPAIR  SEPT 2012   RIGHT EYE VITRECTOMY/ INSERTION GLAUCOMA SETON/ LASER REPAIR  12-13-2008   RETINAL ARTERY OCCLUSION /NEOVASCULAR GLAUCOMA/ HEMORRHAGE   RIGHT EYE VITRETOMY/ INSERTION GLAUCOMA SETON X2/ LASER  03-24-2009   RECURRENT HEMORRHAGE/ OCCLUSION INTERNAL SETON    SHOULDER ARTHROSCOPY Right 2005   undescended right testicle removed  1994    Family History  Problem Relation Age of Onset   Glaucoma Mother    Diabetes Mother    Stomach cancer Maternal Grandfather    Stroke Maternal Grandmother     Social History   Socioeconomic History   Marital status: Married    Spouse name: Not on file   Number of children: 1   Years of education: 12   Highest education level: 12th grade  Occupational History   Occupation: Disabled  Tobacco Use   Smoking status: Never   Smokeless tobacco: Never  Substance and Sexual Activity   Alcohol use: No   Drug use: No   Sexual activity: Not on file  Other Topics Concern   Not on file  Social History Narrative   Lives at home with his wife and granddaughter.   Left-handed.   3 cups caffeine per day.   Social Determinants of Health   Financial Resource Strain: Low Risk  (01/21/2023)   Overall Financial Resource Strain (CARDIA)    Difficulty of Paying Living Expenses: Not hard at all  Food Insecurity: No Food Insecurity (01/21/2023)   Hunger Vital Sign    Worried About Running Out of Food in the Last Year: Never true    Ran Out of Food in the Last Year: Never true  Transportation Needs: No Transportation Needs (01/21/2023)   PRAPARE - Administrator, Civil Service (Medical): No    Lack of Transportation (Non-Medical): No  Physical Activity:  Inactive (01/21/2023)   Exercise Vital Sign    Days of Exercise per Week: 0 days    Minutes of Exercise per Session: 0 min  Stress: No Stress Concern Present (01/21/2023)   [employer] of Occupational Health - Occupational Stress Questionnaire    Feeling of Stress : Not at all  Social Connections: Socially Integrated (01/21/2023)   Social Connection and Isolation Panel [NHANES]    Frequency of Communication with Friends and Family: More than three times a week    Frequency of Social Gatherings with Friends and Family: More than three times a week     Attends Religious Services: More than 4 times per year    Active Member of [employer] or Organizations: Yes    Attends Engineer, structural: More than 4 times per year    Marital Status: Married  Catering manager Violence: Not At Risk (01/21/2023)   Humiliation, Afraid, Rape, and Kick questionnaire    Fear of Current or Ex-Partner: No    Emotionally Abused: No    Physically Abused: No    Sexually Abused: No    Allergies  Allergen Reactions   Lactose Intolerance (Gi) Diarrhea   Apple Pectin [Pectin] Itching    ITCHY THROAT   Lactose Other (See Comments)   Metformin And Related     D/t decreased kidney function    Peach [Prunus Persica] Itching    ITCHY THROAT   Morphine And Codeine Anxiety    Jittery    Current Outpatient Medications  Medication Sig Dispense Refill   amLODipine (NORVASC) 5 MG tablet Take 1 tablet (5 mg total) by mouth daily. 90 tablet 3   aspirin EC 81 MG tablet Take 81 mg by mouth daily.     atorvastatin (LIPITOR) 10 MG tablet TAKE ONE TABLET BY MOUTH EVERY MORNING 90 tablet 3   blood glucose meter kit and supplies KIT Dispense based on patient and insurance preference. Use up to four times daily as directed. 1 each 0   buPROPion (WELLBUTRIN XL) 300 MG 24 hr tablet TAKE ONE TABLET BY MOUTH EVERY MORNING 90 tablet 1   chlorthalidone (HYGROTON) 25 MG tablet Take 25 mg by mouth daily.     Continuous Blood Gluc Receiver (FREESTYLE LIBRE 14 DAY READER) DEVI Use with freestyle libre system 1 each 0   Continuous Glucose Sensor (FREESTYLE LIBRE 3 SENSOR) MISC USE TO check blood glucose AS DIRECTED AND CHANGE sensor every 14 DAYS 1 each 1   doxycycline (VIBRAMYCIN) 100 MG capsule Take 1 capsule (100 mg total) by mouth 2 (two) times daily. 14 capsule 0   gabapentin (NEURONTIN) 300 MG capsule TAKE ONE CAPSULE BY MOUTH EVERYDAY AT BEDTIME 90 capsule 3   glipiZIDE (GLUCOTROL XL) 2.5 MG 24 hr tablet TAKE ONE TABLET BY MOUTH EVERY MORNING and TAKE ONE TABLET BY MOUTH  EVERY EVENING (Patient taking differently: Take 2.5 mg by mouth daily with breakfast.) 180 tablet 3   Insulin Pen Needle (B-D ULTRAFINE III SHORT PEN) 31G X 8 MM MISC USE WITH LANUTS PEN 100 each 3   Lancets (ONETOUCH DELICA PLUS LANCET33G) MISC Apply topically.     LANTUS SOLOSTAR 100 UNIT/ML Solostar Pen INJECT 10 UNITS into THE SKIN DAILY 9 mL 0   losartan (COZAAR) 100 MG tablet TAKE ONE TABLET BY MOUTH EVERY MORNING 90 tablet 1   metoprolol tartrate (LOPRESSOR) 50 MG tablet TAKE ONE TABLET BY MOUTH EVERY MORNING and TAKE ONE TABLET BY MOUTH EVERYDAY AT BEDTIME 180 tablet 3  ONETOUCH ULTRA test strip USE TO check blood glucose UP TO four times daily AS DIRECTED     tiZANidine (ZANAFLEX) 2 MG tablet TAKE ONE TABLET BY MOUTH EVERYDAY AT BEDTIME 90 tablet 3   torsemide (DEMADEX) 20 MG tablet Take 20 mg by mouth 2 (two) times daily.     No current facility-administered medications for this visit.    PHYSICAL EXAM There were no vitals filed for this visit.  Elderly man in no distress Regular rate and rhythm Unlabored breathing In a wheelchair Right side is weak from prior stroke Nondistended abdomen  PERTINENT LABORATORY AND RADIOLOGIC DATA  Most recent CBC    Latest Ref Rng & Units 02/28/2023    7:37 AM 12/19/2021    7:41 AM 12/09/2020    8:04 AM  CBC  WBC 4.0 - 10.5 K/uL 5.3  4.0  3.7   Hemoglobin 13.0 - 17.0 g/dL 09.6  04.5  40.9   Hematocrit 39.0 - 52.0 % 31.0  35.4  37.0   Platelets 150.0 - 400.0 K/uL 283.0  248.0  258.0      Most recent CMP    Latest Ref Rng & Units 02/28/2023    7:37 AM 12/19/2021    7:41 AM 12/09/2020    8:04 AM  CMP  Glucose 70 - 99 mg/dL 811  914  95   BUN 6 - 23 mg/dL 782  44  53   Creatinine 0.40 - 1.50 mg/dL 9.56  2.13  0.86   Sodium 135 - 145 mEq/L 139  140  136   Potassium 3.5 - 5.1 mEq/L 4.4  4.9  4.2   Chloride 96 - 112 mEq/L 102  108  103   CO2 19 - 32 mEq/L 26  25  26    Calcium 8.4 - 10.5 mg/dL 8.9  9.0  8.6   Total Protein 6.0 - 8.3  g/dL 6.9  6.5  6.3   Total Bilirubin 0.2 - 1.2 mg/dL 0.9  0.9  1.0   Alkaline Phos 39 - 117 U/L 90  145  135   AST 0 - 37 U/L 27  20  17    ALT 0 - 53 U/L 41  19  16     Renal function CrCl cannot be calculated (Patient's most recent lab result is older than the maximum 21 days allowed.).  HbA1c, POC (controlled diabetic range) (%)  Date Value  02/11/2020 7.3 (A)   Hgb A1c MFr Bld (%)  Date Value  02/28/2023 8.1 (H)    LDL Cholesterol  Date Value Ref Range Status  02/28/2023 46 0 - 99 mg/dL Final     Rande Brunt. Lenell Antu, MD FACS [facility] Phone Number: [phone] 04/01/2023 4:47 PM   Total time spent on preparing this encounter including chart review, data review, collecting history, examining the patient, coordinating care for this new patient, 45 minutes.  Portions of this report may have been transcribed using voice recognition software.  Every effort has been made to ensure accuracy; however, inadvertent computerized transcription errors may still be present.

## 2023-04-01 NOTE — Progress Notes (Unsigned)
[facility] [place]  ASSESSMENT / PLAN: Russell Austin is a 72 y.o. left handed male in need of permanent dialysis access. I reviewed options for dialysis in detail with the patient, including hemodialysis and peritoneal dialysis. I counseled the patient to ask their nephrologist about their candidacy for renal transplant. I counseled the patient that dialysis access requires surveillance and periodic maintenance. Plan to proceed with laparoscopic peritoneal dialysis catheter placement.  CHIEF COMPLAINT: CKD  HISTORY OF PRESENT ILLNESS: Russell Austin is a 72 y.o. male referred to clinic for evaluation of laparoscopic peritoneal dialysis catheter placement.  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  Patient is left-handed.  He has never had dialysis access before.  He has had an appendectomy before.  No other abdominal surgery.  We reviewed the benefits and drawbacks of peritoneal dialysis.  We reviewed the failure mechanisms of this modality.   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   Diabetes mellitus type II    Diabetic retinopathy FOLLOWED BY DR Luciana Axe   ED (erectile dysfunction)    Glaucoma of both eyes    Hypertension    Left hydrocele    Stroke [facility])     Past Surgical History:  Procedure Laterality Date   APPENDECTOMY  AGE EARLY 20'S   COLONOSCOPY  12/30/2020   every 5 years   HYDROCELE EXCISION  03/31/2012   Procedure: HYDROCELECTOMY ADULT;  Surgeon: Marcine Matar, MD;  Location: [facility];  Service: Urology;  Laterality: Left;  45 MINS     LEFT EYE LASER RETINA REPAIR  SEPT 2012   RIGHT EYE VITRECTOMY/ INSERTION GLAUCOMA SETON/ LASER REPAIR  12-13-2008   RETINAL ARTERY OCCLUSION /NEOVASCULAR GLAUCOMA/ HEMORRHAGE   RIGHT EYE VITRETOMY/ INSERTION GLAUCOMA SETON X2/ LASER  03-24-2009   RECURRENT HEMORRHAGE/ OCCLUSION INTERNAL SETON    SHOULDER ARTHROSCOPY Right 2005   undescended right testicle removed  1994    Family History  Problem Relation Age of Onset   Glaucoma Mother    Diabetes Mother    Stomach cancer Maternal Grandfather    Stroke Maternal Grandmother     Social History   Socioeconomic History   Marital status: Married    Spouse name: Not on file   Number of children: 1   Years of education: 12   Highest education level: 12th grade  Occupational History   Occupation: Disabled  Tobacco Use   Smoking status: Never   Smokeless tobacco: Never  Substance and Sexual Activity   Alcohol use: No   Drug use: No   Sexual activity: Not on file  Other Topics Concern   Not on file  Social History Narrative   Lives at home with his wife and granddaughter.   Left-handed.   3 cups caffeine per day.   Social Determinants of Health   Financial Resource Strain: Low Risk  (01/21/2023)   Overall Financial Resource Strain (CARDIA)    Difficulty of Paying Living Expenses: Not hard at all  Food Insecurity: No Food Insecurity (01/21/2023)   Hunger Vital Sign    Worried About Running Out of Food in the Last Year: Never true    Ran Out of Food in the Last Year: Never true  Transportation Needs: No Transportation Needs (01/21/2023)   PRAPARE - Administrator, Civil Service (Medical): No    Lack of Transportation (Non-Medical): No  Physical Activity:  Inactive (01/21/2023)   Exercise Vital Sign    Days of Exercise per Week: 0 days    Minutes of Exercise per Session: 0 min  Stress: No Stress Concern Present (01/21/2023)   [employer] of Occupational Health - Occupational Stress Questionnaire    Feeling of Stress : Not at all  Social Connections: Socially Integrated (01/21/2023)   Social Connection and Isolation Panel [NHANES]    Frequency of Communication with Friends and Family: More than three times a week    Frequency of Social Gatherings with Friends and Family: More than three times a week     Attends Religious Services: More than 4 times per year    Active Member of [employer] or Organizations: Yes    Attends Engineer, structural: More than 4 times per year    Marital Status: Married  Catering manager Violence: Not At Risk (01/21/2023)   Humiliation, Afraid, Rape, and Kick questionnaire    Fear of Current or Ex-Partner: No    Emotionally Abused: No    Physically Abused: No    Sexually Abused: No    Allergies  Allergen Reactions   Lactose Intolerance (Gi) Diarrhea   Apple Pectin [Pectin] Itching    ITCHY THROAT   Lactose Other (See Comments)   Metformin And Related     D/t decreased kidney function    Peach [Prunus Persica] Itching    ITCHY THROAT   Morphine And Codeine Anxiety    Jittery    Current Outpatient Medications  Medication Sig Dispense Refill   amLODipine (NORVASC) 5 MG tablet Take 1 tablet (5 mg total) by mouth daily. 90 tablet 3   aspirin EC 81 MG tablet Take 81 mg by mouth daily.     atorvastatin (LIPITOR) 10 MG tablet TAKE ONE TABLET BY MOUTH EVERY MORNING 90 tablet 3   blood glucose meter kit and supplies KIT Dispense based on patient and insurance preference. Use up to four times daily as directed. 1 each 0   buPROPion (WELLBUTRIN XL) 300 MG 24 hr tablet TAKE ONE TABLET BY MOUTH EVERY MORNING 90 tablet 1   chlorthalidone (HYGROTON) 25 MG tablet Take 25 mg by mouth daily.     Continuous Blood Gluc Receiver (FREESTYLE LIBRE 14 DAY READER) DEVI Use with freestyle libre system 1 each 0   Continuous Glucose Sensor (FREESTYLE LIBRE 3 SENSOR) MISC USE TO check blood glucose AS DIRECTED AND CHANGE sensor every 14 DAYS 1 each 1   doxycycline (VIBRAMYCIN) 100 MG capsule Take 1 capsule (100 mg total) by mouth 2 (two) times daily. 14 capsule 0   gabapentin (NEURONTIN) 300 MG capsule TAKE ONE CAPSULE BY MOUTH EVERYDAY AT BEDTIME 90 capsule 3   glipiZIDE (GLUCOTROL XL) 2.5 MG 24 hr tablet TAKE ONE TABLET BY MOUTH EVERY MORNING and TAKE ONE TABLET BY MOUTH  EVERY EVENING (Patient taking differently: Take 2.5 mg by mouth daily with breakfast.) 180 tablet 3   Insulin Pen Needle (B-D ULTRAFINE III SHORT PEN) 31G X 8 MM MISC USE WITH LANUTS PEN 100 each 3   Lancets (ONETOUCH DELICA PLUS LANCET33G) MISC Apply topically.     LANTUS SOLOSTAR 100 UNIT/ML Solostar Pen INJECT 10 UNITS into THE SKIN DAILY 9 mL 0   losartan (COZAAR) 100 MG tablet TAKE ONE TABLET BY MOUTH EVERY MORNING 90 tablet 1   metoprolol tartrate (LOPRESSOR) 50 MG tablet TAKE ONE TABLET BY MOUTH EVERY MORNING and TAKE ONE TABLET BY MOUTH EVERYDAY AT BEDTIME 180 tablet 3  ONETOUCH ULTRA test strip USE TO check blood glucose UP TO four times daily AS DIRECTED     tiZANidine (ZANAFLEX) 2 MG tablet TAKE ONE TABLET BY MOUTH EVERYDAY AT BEDTIME 90 tablet 3   torsemide (DEMADEX) 20 MG tablet Take 20 mg by mouth 2 (two) times daily.     No current facility-administered medications for this visit.    PHYSICAL EXAM There were no vitals filed for this visit.  Elderly man in no distress Regular rate and rhythm Unlabored breathing In a wheelchair Right side is weak from prior stroke Nondistended abdomen  PERTINENT LABORATORY AND RADIOLOGIC DATA  Most recent CBC    Latest Ref Rng & Units 02/28/2023    7:37 AM 12/19/2021    7:41 AM 12/09/2020    8:04 AM  CBC  WBC 4.0 - 10.5 K/uL 5.3  4.0  3.7   Hemoglobin 13.0 - 17.0 g/dL 16.1  09.6  04.5   Hematocrit 39.0 - 52.0 % 31.0  35.4  37.0   Platelets 150.0 - 400.0 K/uL 283.0  248.0  258.0      Most recent CMP    Latest Ref Rng & Units 02/28/2023    7:37 AM 12/19/2021    7:41 AM 12/09/2020    8:04 AM  CMP  Glucose 70 - 99 mg/dL 409  811  95   BUN 6 - 23 mg/dL 914  44  53   Creatinine 0.40 - 1.50 mg/dL 7.82  9.56  2.13   Sodium 135 - 145 mEq/L 139  140  136   Potassium 3.5 - 5.1 mEq/L 4.4  4.9  4.2   Chloride 96 - 112 mEq/L 102  108  103   CO2 19 - 32 mEq/L 26  25  26    Calcium 8.4 - 10.5 mg/dL 8.9  9.0  8.6   Total Protein 6.0 - 8.3  g/dL 6.9  6.5  6.3   Total Bilirubin 0.2 - 1.2 mg/dL 0.9  0.9  1.0   Alkaline Phos 39 - 117 U/L 90  145  135   AST 0 - 37 U/L 27  20  17    ALT 0 - 53 U/L 41  19  16     Renal function CrCl cannot be calculated (Patient's most recent lab result is older than the maximum 21 days allowed.).  HbA1c, POC (controlled diabetic range) (%)  Date Value  02/11/2020 7.3 (A)   Hgb A1c MFr Bld (%)  Date Value  02/28/2023 8.1 (H)    LDL Cholesterol  Date Value Ref Range Status  02/28/2023 46 0 - 99 mg/dL Final     Rande Brunt. Lenell Antu, MD FACS [facility] Phone Number: [phone] 04/01/2023 4:47 PM   Total time spent on preparing this encounter including chart review, data review, collecting history, examining the patient, coordinating care for this new patient, 45 minutes.  Portions of this report may have been transcribed using voice recognition software.  Every effort has been made to ensure accuracy; however, inadvertent computerized transcription errors may still be present.

## 2023-04-02 ENCOUNTER — Encounter: Payer: Self-pay | Admitting: Vascular Surgery

## 2023-04-02 ENCOUNTER — Ambulatory Visit: Payer: PPO | Admitting: Vascular Surgery

## 2023-04-02 VITALS — BP 139/75 | HR 55 | Temp 98.2°F | Resp 20 | Ht 69.0 in | Wt 178.0 lb

## 2023-04-02 DIAGNOSIS — N185 Chronic kidney disease, stage 5: Secondary | ICD-10-CM

## 2023-04-05 ENCOUNTER — Other Ambulatory Visit: Payer: Self-pay | Admitting: Adult Health

## 2023-04-05 ENCOUNTER — Encounter: Payer: Self-pay | Admitting: *Deleted

## 2023-04-05 ENCOUNTER — Other Ambulatory Visit: Payer: Self-pay | Admitting: *Deleted

## 2023-04-05 DIAGNOSIS — N185 Chronic kidney disease, stage 5: Secondary | ICD-10-CM

## 2023-04-10 ENCOUNTER — Other Ambulatory Visit: Payer: Self-pay

## 2023-04-10 ENCOUNTER — Encounter (HOSPITAL_COMMUNITY): Payer: Self-pay | Admitting: Vascular Surgery

## 2023-04-10 NOTE — Progress Notes (Signed)
Patient was called to be informed that he must be at the hospital tomorrow morning at 09:10 o'clock. Patient verbalized understanding.

## 2023-04-11 ENCOUNTER — Encounter (HOSPITAL_COMMUNITY)
Admission: RE | Disposition: A | Discharge status: Home / Self Care | Payer: Self-pay | Source: Home / Self Care | Attending: Vascular Surgery

## 2023-04-11 ENCOUNTER — Other Ambulatory Visit: Payer: Self-pay

## 2023-04-11 ENCOUNTER — Encounter (HOSPITAL_COMMUNITY): Payer: Self-pay | Admitting: Vascular Surgery

## 2023-04-11 ENCOUNTER — Ambulatory Visit (HOSPITAL_COMMUNITY)
Admission: RE | Admit: 2023-04-11 | Discharge: 2023-04-11 | Disposition: A | Discharge status: Home / Self Care | Payer: PPO | Attending: Vascular Surgery | Admitting: Vascular Surgery

## 2023-04-11 ENCOUNTER — Ambulatory Visit (HOSPITAL_BASED_OUTPATIENT_CLINIC_OR_DEPARTMENT_OTHER): Payer: PPO | Admitting: Anesthesiology

## 2023-04-11 ENCOUNTER — Ambulatory Visit (HOSPITAL_COMMUNITY): Payer: PPO | Admitting: Anesthesiology

## 2023-04-11 DIAGNOSIS — Z794 Long term (current) use of insulin: Secondary | ICD-10-CM | POA: Diagnosis not present

## 2023-04-11 DIAGNOSIS — Z7984 Long term (current) use of oral hypoglycemic drugs: Secondary | ICD-10-CM | POA: Diagnosis not present

## 2023-04-11 DIAGNOSIS — Z7985 Long-term (current) use of injectable non-insulin antidiabetic drugs: Secondary | ICD-10-CM | POA: Diagnosis not present

## 2023-04-11 DIAGNOSIS — E1122 Type 2 diabetes mellitus with diabetic chronic kidney disease: Secondary | ICD-10-CM | POA: Insufficient documentation

## 2023-04-11 DIAGNOSIS — H5461 Unqualified visual loss, right eye, normal vision left eye: Secondary | ICD-10-CM | POA: Insufficient documentation

## 2023-04-11 DIAGNOSIS — N186 End stage renal disease: Secondary | ICD-10-CM | POA: Diagnosis not present

## 2023-04-11 DIAGNOSIS — Z8673 Personal history of transient ischemic attack (TIA), and cerebral infarction without residual deficits: Secondary | ICD-10-CM | POA: Diagnosis not present

## 2023-04-11 DIAGNOSIS — N185 Chronic kidney disease, stage 5: Secondary | ICD-10-CM | POA: Diagnosis not present

## 2023-04-11 DIAGNOSIS — Z992 Dependence on renal dialysis: Secondary | ICD-10-CM | POA: Diagnosis not present

## 2023-04-11 DIAGNOSIS — Z79899 Other long term (current) drug therapy: Secondary | ICD-10-CM | POA: Insufficient documentation

## 2023-04-11 DIAGNOSIS — I120 Hypertensive chronic kidney disease with stage 5 chronic kidney disease or end stage renal disease: Secondary | ICD-10-CM | POA: Insufficient documentation

## 2023-04-11 DIAGNOSIS — E11319 Type 2 diabetes mellitus with unspecified diabetic retinopathy without macular edema: Secondary | ICD-10-CM | POA: Insufficient documentation

## 2023-04-11 HISTORY — DX: Unspecified osteoarthritis, unspecified site: M19.90

## 2023-04-11 HISTORY — PX: CAPD INSERTION: SHX5233

## 2023-04-11 LAB — POCT I-STAT, CHEM 8
BUN: 116 mg/dL — ABNORMAL HIGH (ref 8–23)
Calcium, Ion: 1.07 mmol/L — ABNORMAL LOW (ref 1.15–1.40)
Chloride: 107 mmol/L (ref 98–111)
Creatinine, Ser: 5.2 mg/dL — ABNORMAL HIGH (ref 0.61–1.24)
Glucose, Bld: 178 mg/dL — ABNORMAL HIGH (ref 70–99)
HCT: 32 % — ABNORMAL LOW (ref 39.0–52.0)
Hemoglobin: 10.9 g/dL — ABNORMAL LOW (ref 13.0–17.0)
Potassium: 4.8 mmol/L (ref 3.5–5.1)
Sodium: 142 mmol/L (ref 135–145)
TCO2: 22 mmol/L (ref 22–32)

## 2023-04-11 LAB — GLUCOSE, CAPILLARY
Glucose-Capillary: 159 mg/dL — ABNORMAL HIGH (ref 70–99)
Glucose-Capillary: 153 mg/dL — ABNORMAL HIGH (ref 70–99)
Glucose-Capillary: 154 mg/dL — ABNORMAL HIGH (ref 70–99)

## 2023-04-11 SURGERY — LAPAROSCOPIC INSERTION CONTINUOUS AMBULATORY PERITONEAL DIALYSIS  (CAPD) CATHETER
Anesthesia: General | Site: Abdomen

## 2023-04-11 MED ORDER — ONDANSETRON HCL 4 MG/2ML IJ SOLN
INTRAMUSCULAR | Status: AC
  Filled 2023-04-11: qty 2

## 2023-04-11 MED ORDER — CEFAZOLIN SODIUM-DEXTROSE 2-4 GM/100ML-% IV SOLN
INTRAVENOUS | Status: AC
  Filled 2023-04-11: qty 100

## 2023-04-11 MED ORDER — ROCURONIUM BROMIDE 10 MG/ML (PF) SYRINGE
PREFILLED_SYRINGE | INTRAVENOUS | Status: DC | PRN
  Administered 2023-04-11: 40 mg via INTRAVENOUS
  Administered 2023-04-11: 20 mg via INTRAVENOUS

## 2023-04-11 MED ORDER — 0.9 % SODIUM CHLORIDE (POUR BTL) OPTIME
TOPICAL | Status: DC | PRN
  Administered 2023-04-11: 1000 mL

## 2023-04-11 MED ORDER — HEPARIN 6000 UNIT IRRIGATION SOLUTION
Status: DC | PRN
  Administered 2023-04-11: 1

## 2023-04-11 MED ORDER — LIDOCAINE 2% (20 MG/ML) 5 ML SYRINGE
INTRAMUSCULAR | Status: AC
  Filled 2023-04-11: qty 5

## 2023-04-11 MED ORDER — MIDAZOLAM HCL 2 MG/2ML IJ SOLN
INTRAMUSCULAR | Status: AC
  Filled 2023-04-11: qty 2

## 2023-04-11 MED ORDER — CEFAZOLIN SODIUM-DEXTROSE 2-4 GM/100ML-% IV SOLN
2.0000 g | INTRAVENOUS | Status: AC
  Administered 2023-04-11: 2 g via INTRAVENOUS

## 2023-04-11 MED ORDER — CHLORHEXIDINE GLUCONATE 0.12 % MT SOLN
OROMUCOSAL | Status: AC
  Administered 2023-04-11: 15 mL
  Filled 2023-04-11: qty 15

## 2023-04-11 MED ORDER — INSULIN ASPART 100 UNIT/ML IJ SOLN: 0.0000 [IU] | INTRAMUSCULAR | Status: DC | PRN

## 2023-04-11 MED ORDER — CHLORHEXIDINE GLUCONATE 4 % EX SOLN: 60.0000 mL | Freq: Once | CUTANEOUS | Status: DC

## 2023-04-11 MED ORDER — HEPARIN 6000 UNIT IRRIGATION SOLUTION
Status: AC
  Filled 2023-04-11: qty 500

## 2023-04-11 MED ORDER — OXYCODONE-ACETAMINOPHEN 5-325 MG PO TABS
1.0000 | ORAL_TABLET | Freq: Four times a day (QID) | ORAL | 0 refills | Status: DC | PRN
Start: 2023-04-11 — End: 2023-10-29

## 2023-04-11 MED ORDER — ACETAMINOPHEN 500 MG PO TABS: 1000.0000 mg | ORAL_TABLET | Freq: Once | ORAL | Status: AC

## 2023-04-11 MED ORDER — ROCURONIUM BROMIDE 10 MG/ML (PF) SYRINGE
PREFILLED_SYRINGE | INTRAVENOUS | Status: AC
  Filled 2023-04-11: qty 10

## 2023-04-11 MED ORDER — SODIUM CHLORIDE 0.9 % IR SOLN
Status: DC | PRN
  Administered 2023-04-11: 1

## 2023-04-11 MED ORDER — ACETAMINOPHEN 500 MG PO TABS
ORAL_TABLET | ORAL | Status: AC
  Administered 2023-04-11: 1000 mg via ORAL
  Filled 2023-04-11: qty 2

## 2023-04-11 MED ORDER — PROPOFOL 10 MG/ML IV BOLUS
INTRAVENOUS | Status: AC
  Filled 2023-04-11: qty 20

## 2023-04-11 MED ORDER — DEXAMETHASONE SODIUM PHOSPHATE 10 MG/ML IJ SOLN
INTRAMUSCULAR | Status: DC | PRN
  Administered 2023-04-11: 5 mg via INTRAVENOUS

## 2023-04-11 MED ORDER — LIDOCAINE 2% (20 MG/ML) 5 ML SYRINGE
INTRAMUSCULAR | Status: DC | PRN
  Administered 2023-04-11: 80 mg via INTRAVENOUS

## 2023-04-11 MED ORDER — ONDANSETRON HCL 4 MG/2ML IJ SOLN
INTRAMUSCULAR | Status: DC | PRN
  Administered 2023-04-11: 4 mg via INTRAVENOUS

## 2023-04-11 MED ORDER — PROPOFOL 10 MG/ML IV BOLUS
INTRAVENOUS | Status: DC | PRN
Start: 2023-04-11 — End: 2023-04-11
  Administered 2023-04-11: 100 mg via INTRAVENOUS

## 2023-04-11 MED ORDER — FENTANYL CITRATE (PF) 250 MCG/5ML IJ SOLN
INTRAMUSCULAR | Status: DC | PRN
  Administered 2023-04-11 (×2): 25 ug via INTRAVENOUS

## 2023-04-11 MED ORDER — PHENYLEPHRINE 80 MCG/ML (10ML) SYRINGE FOR IV PUSH (FOR BLOOD PRESSURE SUPPORT)
PREFILLED_SYRINGE | INTRAVENOUS | Status: DC | PRN
  Administered 2023-04-11 (×2): 80 ug via INTRAVENOUS

## 2023-04-11 MED ORDER — FENTANYL CITRATE (PF) 250 MCG/5ML IJ SOLN
INTRAMUSCULAR | Status: AC
  Filled 2023-04-11: qty 5

## 2023-04-11 MED ORDER — SODIUM CHLORIDE 0.9 % IV SOLN: INTRAVENOUS | Status: DC

## 2023-04-11 MED ORDER — DEXAMETHASONE SODIUM PHOSPHATE 10 MG/ML IJ SOLN
INTRAMUSCULAR | Status: AC
  Filled 2023-04-11: qty 1

## 2023-04-11 MED ORDER — SUGAMMADEX SODIUM 200 MG/2ML IV SOLN
INTRAVENOUS | Status: DC | PRN
  Administered 2023-04-11: 200 mg via INTRAVENOUS

## 2023-04-11 SURGICAL SUPPLY — 61 items
ADAPTER TITANIUM MEDIONICS (MISCELLANEOUS) ×1 IMPLANT
ADH SKN CLS APL DERMABOND .7 (GAUZE/BANDAGES/DRESSINGS) ×1
ADPR DLYS CATH STRL LF DISP (MISCELLANEOUS) ×1
APL PRP STRL LF DISP 70% ISPRP (MISCELLANEOUS) ×1
APL SKNCLS STERI-STRIP NONHPOA (GAUZE/BANDAGES/DRESSINGS) ×1
APPLIER CLIP 5 13 M/L LIGAMAX5 (MISCELLANEOUS)
APR CLP MED LRG 5 ANG JAW (MISCELLANEOUS)
BAG DECANTER FOR FLEXI CONT (MISCELLANEOUS) ×1 IMPLANT
BENZOIN TINCTURE PRP APPL 2/3 (GAUZE/BANDAGES/DRESSINGS) IMPLANT
BIOPATCH RED 1 DISK 7.0 (GAUZE/BANDAGES/DRESSINGS) ×1 IMPLANT
BLADE CLIPPER SURG (BLADE) IMPLANT
BLADE SURG 11 STRL SS (BLADE) ×1 IMPLANT
CATH EXTENDED DIALYSIS (CATHETERS) IMPLANT
CHLORAPREP W/TINT 26 (MISCELLANEOUS) ×1 IMPLANT
CLIP APPLIE 5 13 M/L LIGAMAX5 (MISCELLANEOUS) IMPLANT
COVER SURGICAL LIGHT HANDLE (MISCELLANEOUS) ×1 IMPLANT
DERMABOND ADVANCED .7 DNX12 (GAUZE/BANDAGES/DRESSINGS) ×1 IMPLANT
DEVICE TROCAR PUNCTURE CLOSURE (ENDOMECHANICALS) ×1 IMPLANT
DRSG TEGADERM 4X4.75 (GAUZE/BANDAGES/DRESSINGS) ×3 IMPLANT
ELECT REM PT RETURN 9FT ADLT (ELECTROSURGICAL) ×1
ELECTRODE REM PT RTRN 9FT ADLT (ELECTROSURGICAL) ×1 IMPLANT
GAUZE SPONGE 4X4 12PLY STRL (GAUZE/BANDAGES/DRESSINGS) ×1 IMPLANT
GLOVE INDICATOR 6.5 STRL GRN (GLOVE) ×1 IMPLANT
GLOVE SURG UNDER LTX SZ7.5 (GLOVE) ×1 IMPLANT
GOWN STRL REUS W/ TWL LRG LVL3 (GOWN DISPOSABLE) ×2 IMPLANT
GOWN STRL REUS W/ TWL XL LVL3 (GOWN DISPOSABLE) ×1 IMPLANT
GOWN STRL REUS W/TWL LRG LVL3 (GOWN DISPOSABLE) ×2
GOWN STRL REUS W/TWL XL LVL3 (GOWN DISPOSABLE) ×1
GRASPER SUT TROCAR 14GX15 (MISCELLANEOUS) ×1 IMPLANT
IRRIG SUCT STRYKERFLOW 2 WTIP (MISCELLANEOUS)
IRRIGATION SUCT STRKRFLW 2 WTP (MISCELLANEOUS) IMPLANT
IV NS 1000ML (IV SOLUTION) ×1
IV NS 1000ML BAXH (IV SOLUTION) ×1 IMPLANT
KIT BASIN OR (CUSTOM PROCEDURE TRAY) ×1 IMPLANT
KIT TURNOVER KIT B (KITS) ×1 IMPLANT
NDL INSUFFLATION 14GA 120MM (NEEDLE) ×1 IMPLANT
NEEDLE INSUFFLATION 14GA 120MM (NEEDLE) ×1 IMPLANT
NS IRRIG 1000ML POUR BTL (IV SOLUTION) ×1 IMPLANT
PAD ARMBOARD 7.5X6 YLW CONV (MISCELLANEOUS) ×2 IMPLANT
POWDER SURGICEL 3.0 GRAM (HEMOSTASIS) IMPLANT
SCISSORS LAP 5X35 DISP (ENDOMECHANICALS) IMPLANT
SET CYSTO W/LG BORE CLAMP LF (SET/KITS/TRAYS/PACK) ×1 IMPLANT
SET EXT 12IN DIALYSIS STAY-SAF (MISCELLANEOUS) ×1 IMPLANT
SET TUBE SMOKE EVAC HIGH FLOW (TUBING) ×1 IMPLANT
SLEEVE Z-THREAD 5X100MM (TROCAR) ×2 IMPLANT
SPIKE FLUID TRANSFER (MISCELLANEOUS) ×1 IMPLANT
STYLET FALLER (MISCELLANEOUS) ×1 IMPLANT
STYLET FALLER MEDIONICS (MISCELLANEOUS) ×1 IMPLANT
SUT MNCRL AB 4-0 PS2 18 (SUTURE) ×1 IMPLANT
SUT PROLENE 0 SH 30 (SUTURE) ×2 IMPLANT
SUT SILK 0 TIES 10X30 (SUTURE) ×1 IMPLANT
SUT VICRYL 3 0 (SUTURE) IMPLANT
TAPE CLOTH SURG 4X10 WHT LF (GAUZE/BANDAGES/DRESSINGS) IMPLANT
TOWEL GREEN STERILE (TOWEL DISPOSABLE) ×1 IMPLANT
TOWEL GREEN STERILE FF (TOWEL DISPOSABLE) ×1 IMPLANT
TRAY LAPAROSCOPIC MC (CUSTOM PROCEDURE TRAY) ×1 IMPLANT
TROCAR 11X100 Z THREAD (TROCAR) IMPLANT
TROCAR 5MMX150MM (TROCAR) ×1 IMPLANT
TROCAR XCEL NON-BLD 5MMX100MML (ENDOMECHANICALS) ×1 IMPLANT
TROCAR Z-THREAD OPTICAL 5X100M (TROCAR) ×1 IMPLANT
WATER STERILE IRR 1000ML POUR (IV SOLUTION) ×1 IMPLANT

## 2023-04-11 NOTE — Anesthesia Preprocedure Evaluation (Addendum)
Anesthesia Evaluation  Patient identified by MRN, date of birth, ID band Patient awake    Reviewed: Allergy & Precautions, NPO status , Patient's Chart, lab work & pertinent test results, reviewed documented beta blocker date and time   Airway Mallampati: I  TM Distance: >3 FB Neck ROM: Full    Dental  (+) Teeth Intact, Dental Advisory Given   Pulmonary asthma    Pulmonary exam normal breath sounds clear to auscultation       Cardiovascular hypertension, Pt. on medications and Pt. on home beta blockers (-) angina (-) Past MI Normal cardiovascular exam Rhythm:Regular Rate:Normal     Neuro/Psych R eye blindness   Neuromuscular disease CVA, Residual Symptoms  negative psych ROS   GI/Hepatic negative GI ROS, Neg liver ROS,,,  Endo/Other  diabetes, Type 2, Oral Hypoglycemic Agents, Insulin Dependent    Renal/GU ESRFRenal disease     Musculoskeletal  (+) Arthritis ,    Abdominal   Peds  Hematology negative hematology ROS (+)   Anesthesia Other Findings Day of surgery medications reviewed with the patient.  Reproductive/Obstetrics                             Anesthesia Physical Anesthesia Plan  ASA: 3  Anesthesia Plan: General   Post-op Pain Management: Tylenol PO (pre-op)*   Induction: Intravenous  PONV Risk Score and Plan: 2 and Dexamethasone and Ondansetron  Airway Management Planned: Oral ETT  Additional Equipment:   Intra-op Plan:   Post-operative Plan: Extubation in OR  Informed Consent: I have reviewed the patients History and Physical, chart, labs and discussed the procedure including the risks, benefits and alternatives for the proposed anesthesia with the patient or authorized representative who has indicated his/her understanding and acceptance.     Dental advisory given  Plan Discussed with: CRNA  Anesthesia Plan Comments:         Anesthesia Quick  Evaluation

## 2023-04-11 NOTE — Interval H&P Note (Signed)
History and Physical Interval Note:  04/11/2023 11:41 AM  Russell Austin  has presented today for surgery, with the diagnosis of ESRD.  The various methods of treatment have been discussed with the patient and family. After consideration of risks, benefits and other options for treatment, the patient has consented to  Procedure(s): LAPAROSCOPIC INSERTION CONTINUOUS AMBULATORY PERITONEAL DIALYSIS  (CAPD) CATHETER WITH POSSIBLE OMENTOPEXY (N/A) as a surgical intervention.  The patient's history has been reviewed, patient examined, no change in status, stable for surgery.  I have reviewed the patient's chart and labs.  Questions were answered to the patient's satisfaction.     Leonie Douglas

## 2023-04-11 NOTE — Transfer of Care (Signed)
Immediate Anesthesia Transfer of Care Note  Patient: Margy Clarks  Procedure(s) Performed: LAPAROSCOPIC INSERTION CONTINUOUS AMBULATORY PERITONEAL DIALYSIS  (CAPD) CATHETER WITH POSSIBLE OMENTOPEXY  Patient Location: PACU  Anesthesia Type:General  Level of Consciousness: drowsy and patient cooperative  Airway & Oxygen Therapy: Patient Spontanous Breathing  Post-op Assessment: Report given to RN and Post -op Vital signs reviewed and stable  Post vital signs: Reviewed and stable  Last Vitals:  Vitals Value Taken Time  BP    Temp    Pulse 62 04/11/23 1342  Resp    SpO2 100 % 04/11/23 1342  Vitals shown include unfiled device data.  Last Pain:  Vitals:   04/11/23 0958  TempSrc:   PainSc: 0-No pain         Complications: No notable events documented.

## 2023-04-11 NOTE — Op Note (Signed)
DATE OF SERVICE: 04/11/2023  PATIENT:  Russell Austin  72 y.o. male  PRE-OPERATIVE DIAGNOSIS:  CKD V  POST-OPERATIVE DIAGNOSIS:  Same  PROCEDURE:   1) laparoscopic omentopexy 2) laparoscopic placement of peritoneal dialysis catheter  SURGEON:  Surgeons and Role:    * Leonie Douglas, MD - Primary  ASSISTANT: Nathanial Rancher, PA-C  An experienced assistant was required given the complexity of this procedure and the standard of surgical care. My assistant helped with exposure through counter tension, suctioning, ligation and retraction to better visualize the surgical field.  My assistant expedited sewing during the case by following my sutures. Wherever I use the term "we" in the report, my assistant actively helped me with that portion of the procedure.  ANESTHESIA:   general  EBL: minimal  BLOOD ADMINISTERED:none  DRAINS: none   LOCAL MEDICATIONS USED:  NONE  SPECIMEN:  none  COUNTS: confirmed correct.  TOURNIQUET:  none  PATIENT DISPOSITION:  PACU - hemodynamically stable.   Delay start of Pharmacological VTE agent (>24hrs) due to surgical blood loss or risk of bleeding: no  INDICATION FOR PROCEDURE: Russell Austin is a 72 y.o. male with CKD V. He is in need of permanent dialysis access. After careful discussion of risks, benefits, and alternatives the patient was offered laparoscopic peritoneal dialysis catheter. The patient understood and wished to proceed.  OPERATIVE FINDINGS: some adhesions in right lower quadrant. Able to place peritoneal dialysis catheter without difficulty. Catheter working well at end of case.   DESCRIPTION OF PROCEDURE: After identification of the patient in the pre-operative holding area, the patient was transferred to the operating room. The patient was positioned supine on the operating room table. Anesthesia was induced. The abdomen was prepped and draped in standard fashion. A surgical pause was performed confirming correct patient,  procedure, and operative location.  A Veress needle was introduced into the abdomen at Palmer's point, immediately below the left costal margin.  A saline drop test was used to confirm intra-abdominal position.  The Veress needle was connected to insufflation tubing and insufflation initiated.  A low opening pressure and good flow rate were noted.  A 5 mm trocar was introduced into the right upper quadrant using Visi-View technique.  The obturator was removed and the abdomen inspected with a 30 degree angled laparoscope.  No evidence of Veress needle injury was noted.  An additional 5 mm trocar was inserted a handsbreadth away to facilitate the case.  The omentum was grasped with an atraumatic grasper and elevated to the upper abdomen.  A laparoscopic suture passer was used to deliver a 0 Prolene suture through the omentum.  The suture was secured down to the anterior abdominal wall with a knot.  The omentum was pexied in place.  The abdomen was desufflated.  The peritoneal catheter was measured across the abdominal wall surface.  A mark was made by the cuff in the periumbilical abdomen.  The abdomen was reinsufflated.  An advantage 5 mm trocar was then used to create a skiving path through the anterior rectus fascia, rectus muscle, and peritoneum.  The laparoscopic trocar entered in the suprapubic abdomen directing towards the pelvis.  The working end of the catheter was delivered through the trocar.  The cuff was brought into the abdomen and then placed back into the rectus muscle.  The abdomen was desufflated again.  A "swan-neck" extension tubing for the dialysis catheter was brought onto the field.  The catheter was laid across the desufflated abdomen  to lay across the upper quadrant and exit the left abdomen.  The catheter was cut.  The 2 pieces of catheter were then connected using a Christmas tree adapter.  This was secured in place with 2 interrupted sutures of 0 Prolene.  The connected catheter was  then tunneled to a counterincision in the upper quadrant and then out the lateral abdomen in a downward deflection.  Great care was taken to avoid twisting or kinking the catheter.  The abdomen was reinsufflated.  The peritoneum was inspected to ensure the catheter did not enter the cavity.  Satisfied we desufflated the abdomen.  The catheter was connected to cystoscopy tubing.  The catheter flushed and drained without any difficulty.  The trochars were removed.  All incisions were closed with interrupted 4-0 Monocryl sutures.  Dermabond was applied.  A sterile bandage was applied to the peritoneal dialysis catheter.   Upon completion of the case instrument and sharps counts were confirmed correct. The patient was transferred to the PACU in good condition. I was present for all portions of the procedure.  FOLLOW UP PLAN: Assuming a normal postoperative course, the patient can follow up on an as needed basis.   Russell Brunt. Lenell Antu, MD [facility] Phone Number: [phone] 04/11/2023 1:32 PM

## 2023-04-11 NOTE — Anesthesia Procedure Notes (Signed)
Procedure Name: Intubation Date/Time: 04/11/2023 12:18 PM  Performed by: Gus Puma, CRNAPre-anesthesia Checklist: Patient identified, Emergency Drugs available, Suction available and Patient being monitored Patient Re-evaluated:Patient Re-evaluated prior to induction Oxygen Delivery Method: Circle System Utilized Preoxygenation: Pre-oxygenation with 100% oxygen Induction Type: IV induction Ventilation: Mask ventilation without difficulty Laryngoscope Size: Mac and 4 Tube type: Oral Tube size: 7.5 mm Number of attempts: 1 Placement Confirmation: ETT inserted through vocal cords under direct vision, positive ETCO2 and breath sounds checked- equal and bilateral Secured at: 22 cm Tube secured with: Tape Dental Injury: Teeth and Oropharynx as per pre-operative assessment

## 2023-04-12 ENCOUNTER — Encounter (HOSPITAL_COMMUNITY): Payer: Self-pay | Admitting: Vascular Surgery

## 2023-04-12 NOTE — Anesthesia Postprocedure Evaluation (Signed)
Anesthesia Post Note  Patient: Russell Austin  Procedure(s) Performed: LAPAROSCOPIC INSERTION CONTINUOUS AMBULATORY PERITONEAL DIALYSIS  (CAPD) CATHETER WITH  OMENTOPEXY (Abdomen)     Patient location during evaluation: PACU Anesthesia Type: General Level of consciousness: awake and alert Pain management: pain level controlled Vital Signs Assessment: post-procedure vital signs reviewed and stable Respiratory status: spontaneous breathing, nonlabored ventilation, respiratory function stable and patient connected to nasal cannula oxygen Cardiovascular status: blood pressure returned to baseline and stable Postop Assessment: no apparent nausea or vomiting Anesthetic complications: no   No notable events documented.  Last Vitals:  Vitals:   04/11/23 1443 04/11/23 1444  BP: (!) 156/79   Pulse: 68 67  Resp: 12 12  Temp:  36.5 C  SpO2: 99% 98%    Last Pain:  Vitals:   04/11/23 1444  TempSrc:   PainSc: 0-No pain                 Collene Schlichter

## 2023-04-15 ENCOUNTER — Other Ambulatory Visit: Payer: Self-pay | Admitting: Adult Health

## 2023-04-15 DIAGNOSIS — I10 Essential (primary) hypertension: Secondary | ICD-10-CM

## 2023-04-18 ENCOUNTER — Encounter (INDEPENDENT_AMBULATORY_CARE_PROVIDER_SITE_OTHER): Payer: Self-pay

## 2023-04-18 DIAGNOSIS — D631 Anemia in chronic kidney disease: Secondary | ICD-10-CM | POA: Diagnosis not present

## 2023-04-18 DIAGNOSIS — N184 Chronic kidney disease, stage 4 (severe): Secondary | ICD-10-CM | POA: Diagnosis not present

## 2023-04-18 DIAGNOSIS — E1122 Type 2 diabetes mellitus with diabetic chronic kidney disease: Secondary | ICD-10-CM | POA: Diagnosis not present

## 2023-04-18 DIAGNOSIS — N189 Chronic kidney disease, unspecified: Secondary | ICD-10-CM | POA: Diagnosis not present

## 2023-04-18 DIAGNOSIS — I129 Hypertensive chronic kidney disease with stage 1 through stage 4 chronic kidney disease, or unspecified chronic kidney disease: Secondary | ICD-10-CM | POA: Diagnosis not present

## 2023-04-18 DIAGNOSIS — N2581 Secondary hyperparathyroidism of renal origin: Secondary | ICD-10-CM | POA: Diagnosis not present

## 2023-04-19 LAB — LAB REPORT - SCANNED
EGFR: 15
HM Hepatitis Screen: NEGATIVE

## 2023-05-08 DIAGNOSIS — Z992 Dependence on renal dialysis: Secondary | ICD-10-CM | POA: Diagnosis not present

## 2023-05-08 DIAGNOSIS — I129 Hypertensive chronic kidney disease with stage 1 through stage 4 chronic kidney disease, or unspecified chronic kidney disease: Secondary | ICD-10-CM | POA: Diagnosis not present

## 2023-05-08 DIAGNOSIS — N186 End stage renal disease: Secondary | ICD-10-CM | POA: Diagnosis not present

## 2023-05-27 ENCOUNTER — Telehealth: Payer: Self-pay | Admitting: Adult Health

## 2023-05-27 NOTE — Telephone Encounter (Signed)
Prescription Request  05/27/2023  LOV: 02/28/2023  What is the name of the medication or equipment? LANTUS SOLOSTAR 100 UNIT/ML Solostar Pen  Have you contacted your pharmacy to request a refill? No   Which pharmacy would you like this sent to?  [id number] [place], [place] - [address] [place] [address] Phone: [phone] Fax: [phone]    Patient notified that their request is being sent to the clinical staff for review and that they should receive a response within 2 business days.   Please advise at Mobile [phone] (mobile)

## 2023-05-27 NOTE — Telephone Encounter (Signed)
Pt also needs Insulin Pen Needle (B-D ULTRAFINE III SHORT PEN) 31G X 8 MM MIS

## 2023-05-29 NOTE — Telephone Encounter (Signed)
Tried to call pt to advised if he had enough for tonight. We will wait until pt comes in to send in Rx. Pt is due for A1c.

## 2023-05-30 ENCOUNTER — Encounter: Payer: Self-pay | Admitting: Adult Health

## 2023-05-30 ENCOUNTER — Ambulatory Visit: Payer: PPO | Admitting: Adult Health

## 2023-05-30 VITALS — BP 200/80 | HR 107 | Temp 98.1°F | Ht 69.0 in | Wt 158.0 lb

## 2023-05-30 DIAGNOSIS — G8191 Hemiplegia, unspecified affecting right dominant side: Secondary | ICD-10-CM

## 2023-05-30 DIAGNOSIS — Z794 Long term (current) use of insulin: Secondary | ICD-10-CM

## 2023-05-30 DIAGNOSIS — I10 Essential (primary) hypertension: Secondary | ICD-10-CM

## 2023-05-30 DIAGNOSIS — R066 Hiccough: Secondary | ICD-10-CM | POA: Diagnosis not present

## 2023-05-30 DIAGNOSIS — E785 Hyperlipidemia, unspecified: Secondary | ICD-10-CM

## 2023-05-30 DIAGNOSIS — E119 Type 2 diabetes mellitus without complications: Secondary | ICD-10-CM

## 2023-05-30 DIAGNOSIS — Z7984 Long term (current) use of oral hypoglycemic drugs: Secondary | ICD-10-CM

## 2023-05-30 DIAGNOSIS — Z23 Encounter for immunization: Secondary | ICD-10-CM | POA: Diagnosis not present

## 2023-05-30 DIAGNOSIS — R112 Nausea with vomiting, unspecified: Secondary | ICD-10-CM | POA: Diagnosis not present

## 2023-05-30 LAB — GLUCOSE, POCT (MANUAL RESULT ENTRY): POC Glucose: 534 mg/dl — AB (ref 70–99)

## 2023-05-30 LAB — POCT GLYCOSYLATED HEMOGLOBIN (HGB A1C): Hemoglobin A1C: 10.2 % — AB (ref 4.0–5.6)

## 2023-05-30 MED ORDER — TIZANIDINE HCL 2 MG PO TABS
ORAL_TABLET | ORAL | 3 refills | Status: DC
Start: 2023-05-30 — End: 2024-01-02

## 2023-05-30 MED ORDER — METOCLOPRAMIDE HCL 10 MG PO TABS: 10.0000 mg | ORAL_TABLET | Freq: Four times a day (QID) | ORAL | 0 refills | Status: DC

## 2023-05-30 MED ORDER — LANTUS SOLOSTAR 100 UNIT/ML ~~LOC~~ SOPN
20.0000 [IU] | PEN_INJECTOR | Freq: Every day | SUBCUTANEOUS | 0 refills | Status: DC
Start: 2023-05-30 — End: 2023-06-26

## 2023-05-30 MED ORDER — GLIPIZIDE ER 5 MG PO TB24
5.0000 mg | ORAL_TABLET | Freq: Two times a day (BID) | ORAL | 1 refills | Status: DC
Start: 2023-05-30 — End: 2023-11-19

## 2023-05-30 MED ORDER — ATORVASTATIN CALCIUM 10 MG PO TABS: 10.0000 mg | ORAL_TABLET | Freq: Every morning | ORAL | 3 refills | Status: AC

## 2023-05-30 MED ORDER — GABAPENTIN 300 MG PO CAPS: ORAL_CAPSULE | ORAL | 3 refills | Status: AC

## 2023-05-30 MED ORDER — FREESTYLE LIBRE 3 SENSOR MISC
6 refills | Status: DC
Start: 2023-05-30 — End: 2023-07-09

## 2023-05-30 NOTE — Progress Notes (Signed)
Subjective:    Patient ID: Russell Austin, male    DOB: 1950/12/23, 72 y.o.   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  He presents to the office today for follow-up regarding diabetes and hypertension.  Since the last time I saw him he had laparoscopic insertion of continuous ambulatory peritoneal dialysis with her placed in early August 2024. This is his second week of PD. His wife reports that for the last 24 hours he has had nausea and vomiting and for the last week he has had constant hiccups.He went to the [facility]ialsus center yesterday and they took a sample of the fluid to look for infection. His wife also reports that they gave him some "antibiotics in his fluid" He has not had any fevers, chills, or diarrhea.   DM II -he is currently managed with Lantus 14 units daily( nephrology increased this to 14 units at their last appointment on 04/24/2023)and glipizide 2.5 mg BID.  He has not been checking his blood sugars on a routine basis.   Does lead a pretty sedentary lifestyle.   Lab Results  Component Value Date   HGBA1C 8.1 (H) 02/28/2023   Hypertension-managed with Norvasc 5 mg daily, losartan 100 mg daily, chlorthalidone 12.5 mg daily, Torsemide 20 mg BID, and metoprolol 50 mg twice daily.  He denies dizziness, lightheadedness, chest pain, shortness of breath, or syncopal episodes.  He does monitor his blood pressures at home with readings in the 130s to 140s over 70s to 80s. 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   Chronic kidney disease    Diabetes mellitus type II    Diabetic retinopathy FOLLOWED BY DR Luciana Axe   ED (erectile dysfunction)    Glaucoma of both eyes    Hypertension    Left hydrocele    Stroke [facility])     Social History   Socioeconomic History   Marital status: Married    Spouse name: Not on file   Number of children: 1   Years of education: 12   Highest education level: 12th grade  Occupational History   Occupation: Disabled  Tobacco Use   Smoking status: Never   Smokeless tobacco: Never  Vaping Use   Vaping status: Never Used  Substance and Sexual Activity   Alcohol use: No   Drug use: No   Sexual activity: Not on file  Other Topics Concern   Not on file  Social History Narrative   Lives at home with his wife and granddaughter.   Left-handed.   3 cups caffeine per day.   Social Determinants of Health   Financial Resource Strain: Low Risk  (01/21/2023)   Overall Financial Resource Strain (CARDIA)    Difficulty of Paying Living Expenses: Not hard at all  Food Insecurity: No Food Insecurity (01/21/2023)   Hunger Vital Sign    Worried About Running Out of Food in the Last Year: Never true    Ran Out  of Food in the Last Year: Never true  Transportation Needs: No Transportation Needs (01/21/2023)   PRAPARE - Administrator, Civil Service (Medical): No    Lack of Transportation (Non-Medical): No  Physical Activity: Inactive (01/21/2023)   Exercise Vital Sign    Days of Exercise per Week: 0 days    Minutes of Exercise per Session: 0 min  Stress: No Stress Concern Present (01/21/2023)   [employer] of Occupational Health - Occupational Stress Questionnaire    Feeling of Stress : Not at all  Social Connections: Socially Integrated (01/21/2023)   Social Connection and Isolation Panel [NHANES]    Frequency of Communication with Friends and Family: More than three times a week     Frequency of Social Gatherings with Friends and Family: More than three times a week    Attends Religious Services: More than 4 times per year    Active Member of Clubs or Organizations: Yes    Attends Banker Meetings: More than 4 times per year    Marital Status: Married  Catering manager Violence: Not At Risk (01/21/2023)   Humiliation, Afraid, Rape, and Kick questionnaire    Fear of Current or Ex-Partner: No    Emotionally Abused: No    Physically Abused: No    Sexually Abused: No    Past Surgical History:  Procedure Laterality Date   APPENDECTOMY  AGE EARLY 20'S   CAPD INSERTION N/A 04/11/2023   Procedure: LAPAROSCOPIC INSERTION CONTINUOUS AMBULATORY PERITONEAL DIALYSIS  (CAPD) CATHETER WITH  OMENTOPEXY;  Surgeon: Leonie Douglas, MD;  Location: [facility] OR;  Service: Vascular;  Laterality: N/A;   COLONOSCOPY  12/30/2020   every 5 years   HYDROCELE EXCISION  03/31/2012   Procedure: HYDROCELECTOMY ADULT;  Surgeon: Marcine Matar, MD;  Location: [facility];  Service: Urology;  Laterality: Left;  45 MINS     LEFT EYE LASER RETINA REPAIR  SEPT 2012   RIGHT EYE VITRECTOMY/ INSERTION GLAUCOMA SETON/ LASER REPAIR  12-13-2008   RETINAL ARTERY OCCLUSION /NEOVASCULAR GLAUCOMA/ HEMORRHAGE   RIGHT EYE VITRETOMY/ INSERTION GLAUCOMA SETON X2/ LASER  03-24-2009   RECURRENT HEMORRHAGE/ OCCLUSION INTERNAL SETON   SHOULDER ARTHROSCOPY Right 2005   undescended right testicle removed  1994    Family History  Problem Relation Age of Onset   Glaucoma Mother    Diabetes Mother    Stomach cancer Maternal Grandfather    Stroke Maternal Grandmother     Allergies  Allergen Reactions   Lactose Intolerance (Gi) Diarrhea   Apple Pectin [Pectin] Itching    ITCHY THROAT   Lactose Other (See Comments)   Metformin And Related     D/t decreased kidney function    Peach [Prunus Persica] Itching    ITCHY THROAT   Morphine And Codeine Anxiety    Jittery    Current  Outpatient Medications on File Prior to Visit  Medication Sig Dispense Refill   amLODipine (NORVASC) 5 MG tablet Take 1 tablet (5 mg total) by mouth daily. 90 tablet 3   aspirin EC 81 MG tablet Take 81 mg by mouth daily.     atorvastatin (LIPITOR) 10 MG tablet TAKE ONE TABLET BY MOUTH EVERY MORNING 90 tablet 3   blood glucose meter kit and supplies KIT Dispense based on patient and insurance preference. Use up to four times daily as directed. 1 each 0   buPROPion (WELLBUTRIN XL) 300 MG 24 hr tablet TAKE ONE TABLET BY MOUTH EVERY MORNING 90 tablet  1   calcitRIOL (ROCALTROL) 0.25 MCG capsule Take 0.25 mcg by mouth daily.     chlorthalidone (HYGROTON) 25 MG tablet Take 25 mg by mouth daily.     Continuous Blood Gluc Receiver (FREESTYLE LIBRE 14 DAY READER) DEVI Use with freestyle libre system 1 each 0   Continuous Glucose Sensor (FREESTYLE LIBRE 3 SENSOR) MISC Apply new sensor every 14 days to monitor blood glucose. Remove old sensor before applying new one. 2 each 1   Finerenone (KERENDIA) 10 MG TABS Take 10 mg by mouth daily.     gabapentin (NEURONTIN) 300 MG capsule TAKE ONE CAPSULE BY MOUTH EVERYDAY AT BEDTIME 90 capsule 3   gentamicin cream (GARAMYCIN) 0.1 % Apply topically daily.     glipiZIDE (GLUCOTROL XL) 2.5 MG 24 hr tablet TAKE ONE TABLET BY MOUTH EVERY MORNING and TAKE ONE TABLET BY MOUTH EVERY EVENING (Patient taking differently: Take 2.5 mg by mouth daily with breakfast.) 180 tablet 3   Insulin Pen Needle (B-D ULTRAFINE III SHORT PEN) 31G X 8 MM MISC USE WITH LANUTS PEN 100 each 3   lactulose (CHRONULAC) 10 GM/15ML solution Take by mouth.     Lancets (ONETOUCH DELICA PLUS LANCET33G) MISC Apply topically.     LANTUS SOLOSTAR 100 UNIT/ML Solostar Pen INJECT 10 UNITS into THE SKIN DAILY 9 mL 0   losartan (COZAAR) 100 MG tablet TAKE ONE TABLET BY MOUTH EVERY MORNING 90 tablet 1   metoprolol tartrate (LOPRESSOR) 50 MG tablet TAKE ONE TABLET BY MOUTH EVERY MORNING and TAKE ONE TABLET BY  MOUTH EVERYDAY AT BEDTIME 180 tablet 3   ONETOUCH ULTRA test strip USE TO check blood glucose UP TO four times daily AS DIRECTED     oxyCODONE-acetaminophen (PERCOCET) 5-325 MG tablet Take 1 tablet by mouth every 6 (six) hours as needed for severe pain. 20 tablet 0   tiZANidine (ZANAFLEX) 2 MG tablet TAKE ONE TABLET BY MOUTH EVERYDAY AT BEDTIME 90 tablet 3   torsemide (DEMADEX) 20 MG tablet Take 20 mg by mouth 2 (two) times daily.     No current facility-administered medications on file prior to visit.    BP (!) 200/80   Pulse (!) 107   Temp 98.1 F (36.7 C) (Oral)   Ht 5\' 9"  (1.753 m)   Wt 158 lb (71.7 kg)   SpO2 98%   BMI 23.33 kg/m       Objective:   Physical Exam Vitals and nursing note reviewed.  Constitutional:      Appearance: Normal appearance. He is ill-appearing (chronically).  Cardiovascular:     Rate and Rhythm: Normal rate and regular rhythm.     Pulses: Normal pulses.     Heart sounds: Normal heart sounds.  Pulmonary:     Effort: Pulmonary effort is normal.     Breath sounds: Normal breath sounds.  Musculoskeletal:        General: Normal range of motion.  Skin:    General: Skin is warm and dry.  Neurological:     General: No focal deficit present.     Mental Status: He is alert and oriented to person, place, and time.  Psychiatric:        Mood and Affect: Mood normal.        Behavior: Behavior normal.        Thought Content: Thought content normal.        Judgment: Judgment normal.        Assessment & Plan:  1. Diabetes mellitus treated  with oral medication (HCC) - Will increase Lantus to 20 units daily and increase Glipizide to 5 mg ER BID.  - POC HgB A1c- 10.2  - glipiZIDE (GLUCOTROL XL) 5 MG 24 hr tablet; Take 1 tablet (5 mg total) by mouth 2 (two) times daily.  Dispense: 180 tablet; Refill: 1 - Continuous Glucose Sensor (FREESTYLE LIBRE 3 SENSOR) MISC; Apply new sensor every 14 days to monitor blood glucose. Remove old sensor before applying new  one.  Dispense: 2 each; Refill: 6 - POC Glucose (CBG)- 534 - Informed wife to let me know if BS not dropping below 200 with change otherwise follow up in 3 months   2. Current use of insulin (HCC)  - insulin glargine (LANTUS SOLOSTAR) 100 UNIT/ML Solostar Pen; Inject 20 Units into the skin daily.  Dispense: 20 mL; Refill: 0 - Continuous Glucose Sensor (FREESTYLE LIBRE 3 SENSOR) MISC; Apply new sensor every 14 days to monitor blood glucose. Remove old sensor before applying new one.  Dispense: 2 each; Refill: 6 - POC Glucose (CBG)  3. Essential hypertension - Elevated today due to not taking medication   4. Need for influenza vaccination  - Flu Vaccine Trivalent High Dose (Fluad)   5. Right hemiparesis (HCC) - needs refill  - gabapentin (NEURONTIN) 300 MG capsule; TAKE ONE CAPSULE BY MOUTH EVERYDAY AT BEDTIME  Dispense: 90 capsule; Refill: 3 - tiZANidine (ZANAFLEX) 2 MG tablet; TAKE ONE TABLET BY MOUTH EVERYDAY AT BEDTIME  Dispense: 90 tablet; Refill: 3  6. Nausea and vomiting, unspecified vomiting type - metoCLOPramide (REGLAN) 10 MG tablet; Take 1 tablet (10 mg total) by mouth 4 (four) times daily.  Dispense: 120 tablet; Refill: 0  7. Hiccups - Will trial him on Reglan - advised to stop medication when hiccups stop  - metoCLOPramide (REGLAN) 10 MG tablet; Take 1 tablet (10 mg total) by mouth 4 (four) times daily.  Dispense: 120 tablet; Refill: 0  8. Hyperlipidemia, unspecified hyperlipidemia type - Needs refill  - atorvastatin (LIPITOR) 10 MG tablet; Take 1 tablet (10 mg total) by mouth every morning.  Dispense: 90 tablet; Refill: 3  Shirline Frees, NP

## 2023-05-30 NOTE — Patient Instructions (Signed)
Health Maintenance Due  Topic Date Due   FOOT EXAM  12/20/2022   INFLUENZA VACCINE  04/04/2023       02/28/2023    7:12 AM 01/21/2023   12:40 PM 01/08/2023    8:36 AM  Depression screen PHQ 2/9  Decreased Interest 0 0 1  Down, Depressed, Hopeless 0 0 0  PHQ - 2 Score 0 0 1  Altered sleeping 2 0 3  Tired, decreased energy 0 0 3  Change in appetite 1 0 2  Feeling bad or failure about yourself  0 0 1  Trouble concentrating 0 0 0  Moving slowly or fidgety/restless 1 0 3  Suicidal thoughts 0 0 1  PHQ-9 Score 4 0 14  Difficult doing work/chores Not difficult at all Not difficult at all Somewhat difficult

## 2023-06-01 ENCOUNTER — Emergency Department (HOSPITAL_COMMUNITY): Payer: PPO

## 2023-06-01 ENCOUNTER — Emergency Department (HOSPITAL_COMMUNITY)
Admission: EM | Admit: 2023-06-01 | Discharge: 2023-06-01 | Disposition: A | Discharge status: Home / Self Care | Payer: PPO | Attending: Emergency Medicine | Admitting: Emergency Medicine

## 2023-06-01 ENCOUNTER — Other Ambulatory Visit: Payer: Self-pay

## 2023-06-01 ENCOUNTER — Encounter (HOSPITAL_COMMUNITY): Payer: Self-pay

## 2023-06-01 DIAGNOSIS — R066 Hiccough: Secondary | ICD-10-CM | POA: Diagnosis not present

## 2023-06-01 DIAGNOSIS — R079 Chest pain, unspecified: Secondary | ICD-10-CM | POA: Diagnosis not present

## 2023-06-01 DIAGNOSIS — E878 Other disorders of electrolyte and fluid balance, not elsewhere classified: Secondary | ICD-10-CM | POA: Diagnosis not present

## 2023-06-01 DIAGNOSIS — E871 Hypo-osmolality and hyponatremia: Secondary | ICD-10-CM | POA: Diagnosis not present

## 2023-06-01 DIAGNOSIS — E1122 Type 2 diabetes mellitus with diabetic chronic kidney disease: Secondary | ICD-10-CM | POA: Diagnosis not present

## 2023-06-01 DIAGNOSIS — I129 Hypertensive chronic kidney disease with stage 1 through stage 4 chronic kidney disease, or unspecified chronic kidney disease: Secondary | ICD-10-CM | POA: Insufficient documentation

## 2023-06-01 DIAGNOSIS — Z7982 Long term (current) use of aspirin: Secondary | ICD-10-CM | POA: Insufficient documentation

## 2023-06-01 DIAGNOSIS — Z79899 Other long term (current) drug therapy: Secondary | ICD-10-CM | POA: Diagnosis not present

## 2023-06-01 DIAGNOSIS — Z794 Long term (current) use of insulin: Secondary | ICD-10-CM | POA: Insufficient documentation

## 2023-06-01 DIAGNOSIS — N189 Chronic kidney disease, unspecified: Secondary | ICD-10-CM | POA: Insufficient documentation

## 2023-06-01 DIAGNOSIS — R2243 Localized swelling, mass and lump, lower limb, bilateral: Secondary | ICD-10-CM | POA: Insufficient documentation

## 2023-06-01 DIAGNOSIS — E876 Hypokalemia: Secondary | ICD-10-CM | POA: Insufficient documentation

## 2023-06-01 DIAGNOSIS — R188 Other ascites: Secondary | ICD-10-CM | POA: Diagnosis not present

## 2023-06-01 DIAGNOSIS — Z992 Dependence on renal dialysis: Secondary | ICD-10-CM | POA: Diagnosis not present

## 2023-06-01 DIAGNOSIS — J479 Bronchiectasis, uncomplicated: Secondary | ICD-10-CM | POA: Diagnosis not present

## 2023-06-01 DIAGNOSIS — R0789 Other chest pain: Secondary | ICD-10-CM | POA: Diagnosis not present

## 2023-06-01 LAB — BASIC METABOLIC PANEL
Anion gap: 9 (ref 5–15)
BUN: 61 mg/dL — ABNORMAL HIGH (ref 8–23)
CO2: 31 mmol/L (ref 22–32)
Calcium: 8 mg/dL — ABNORMAL LOW (ref 8.9–10.3)
Chloride: 91 mmol/L — ABNORMAL LOW (ref 98–111)
Creatinine, Ser: 3.06 mg/dL — ABNORMAL HIGH (ref 0.61–1.24)
GFR, Estimated: 21 mL/min — ABNORMAL LOW (ref >60)
Glucose, Bld: 401 mg/dL — ABNORMAL HIGH (ref 70–99)
Potassium: 2.8 mmol/L — ABNORMAL LOW (ref 3.5–5.1)
Sodium: 131 mmol/L — ABNORMAL LOW (ref 135–145)

## 2023-06-01 LAB — CBC
HCT: 36.3 % — ABNORMAL LOW (ref 39.0–52.0)
Hemoglobin: 12 g/dL — ABNORMAL LOW (ref 13.0–17.0)
MCH: 30 pg (ref 26.0–34.0)
MCHC: 33.1 g/dL (ref 30.0–36.0)
MCV: 90.8 fL (ref 80.0–100.0)
Platelets: 247 10*3/uL (ref 150–400)
RBC: 4 MIL/uL — ABNORMAL LOW (ref 4.22–5.81)
RDW: 12 % (ref 11.5–15.5)
WBC: 5.6 10*3/uL (ref 4.0–10.5)
nRBC: 0 % (ref 0.0–0.2)

## 2023-06-01 LAB — HEPATIC FUNCTION PANEL
ALT: 23 U/L (ref 0–44)
AST: 29 U/L (ref 15–41)
Albumin: 2.5 g/dL — ABNORMAL LOW (ref 3.5–5.0)
Alkaline Phosphatase: 70 U/L (ref 38–126)
Bilirubin, Direct: 0.3 mg/dL — ABNORMAL HIGH (ref 0.0–0.2)
Indirect Bilirubin: 0.8 mg/dL (ref 0.3–0.9)
Total Bilirubin: 1.1 mg/dL (ref 0.3–1.2)
Total Protein: 5.3 g/dL — ABNORMAL LOW (ref 6.5–8.1)

## 2023-06-01 LAB — D-DIMER, QUANTITATIVE: D-Dimer, Quant: 2.34 ug{FEU}/mL — ABNORMAL HIGH (ref 0.00–0.50)

## 2023-06-01 LAB — MAGNESIUM: Magnesium: 1.8 mg/dL (ref 1.7–2.4)

## 2023-06-01 LAB — TROPONIN I (HIGH SENSITIVITY)
Troponin I (High Sensitivity): 29 ng/L — ABNORMAL HIGH (ref <18)
Troponin I (High Sensitivity): 24 ng/L — ABNORMAL HIGH (ref <18)

## 2023-06-01 LAB — CBG MONITORING, ED: Glucose-Capillary: 222 mg/dL — ABNORMAL HIGH (ref 70–99)

## 2023-06-01 MED ORDER — FENTANYL CITRATE PF 50 MCG/ML IJ SOSY
50.0000 ug | PREFILLED_SYRINGE | INTRAMUSCULAR | Status: DC | PRN
  Administered 2023-06-01: 50 ug via INTRAVENOUS
  Filled 2023-06-01: qty 1

## 2023-06-01 MED ORDER — DIPHENHYDRAMINE HCL 50 MG/ML IJ SOLN
12.5000 mg | Freq: Once | INTRAMUSCULAR | Status: AC
  Administered 2023-06-01: 12.5 mg via INTRAVENOUS
  Filled 2023-06-01: qty 1

## 2023-06-01 MED ORDER — SODIUM CHLORIDE 0.9 % IV BOLUS
500.0000 mL | Freq: Once | INTRAVENOUS | Status: AC
  Administered 2023-06-01: 500 mL via INTRAVENOUS

## 2023-06-01 MED ORDER — INSULIN ASPART 100 UNIT/ML IJ SOLN
6.0000 [IU] | Freq: Once | INTRAMUSCULAR | Status: DC
  Administered 2023-06-01: 6 [IU] via SUBCUTANEOUS

## 2023-06-01 MED ORDER — POTASSIUM CHLORIDE 10 MEQ/100ML IV SOLN: 10.0000 meq | INTRAVENOUS | Status: DC

## 2023-06-01 MED ORDER — TECHNETIUM TO 99M ALBUMIN AGGREGATED
4.3000 | Freq: Once | INTRAVENOUS | Status: AC | PRN
  Administered 2023-06-01: 4.3 via INTRAVENOUS

## 2023-06-01 MED ORDER — ALUM & MAG HYDROXIDE-SIMETH 200-200-20 MG/5ML PO SUSP
30.0000 mL | Freq: Once | ORAL | Status: AC
  Administered 2023-06-01: 30 mL via ORAL
  Filled 2023-06-01: qty 30

## 2023-06-01 MED ORDER — SODIUM CHLORIDE 0.9 % IV SOLN
25.0000 mg | INTRAVENOUS | Status: AC
  Administered 2023-06-01: 25 mg via INTRAVENOUS
  Filled 2023-06-01: qty 1

## 2023-06-01 MED ORDER — MAALOX MAX 400-400-40 MG/5ML PO SUSP: 10.0000 mL | Freq: Four times a day (QID) | ORAL | 0 refills | Status: AC | PRN

## 2023-06-01 MED ORDER — CHLORPROMAZINE HCL 25 MG PO TABS: 25.0000 mg | ORAL_TABLET | Freq: Three times a day (TID) | ORAL | 0 refills | Status: DC | PRN

## 2023-06-01 MED ORDER — METOCLOPRAMIDE HCL 5 MG/ML IJ SOLN
10.0000 mg | Freq: Once | INTRAMUSCULAR | Status: AC
  Administered 2023-06-01: 10 mg via INTRAVENOUS
  Filled 2023-06-01: qty 2

## 2023-06-01 NOTE — ED Provider Notes (Signed)
  Physical Exam  BP 130/70   Pulse 70   Temp 99 F (37.2 C) (Oral)   Resp 18   SpO2 99%   Physical Exam  Procedures  Procedures  ED Course / MDM   Clinical Course as of 06/01/23 1616  Sat Jun 01, 2023  1046 3 DIALYSIS  Monday sx began.  Surgery was completed 8/8 [WF]  1338 Discussed with Marya Amsler NM tech -- evening or tomorrow study (VQ) [WF]    Clinical Course User Index [WF] Gailen Shelter, PA   Medical Decision Making Amount and/or Complexity of Data Reviewed Labs: ordered. Radiology: ordered.  Risk OTC drugs. Prescription drug management.   Patient care taken over from previous PA-C at shift change. V/Q scan pending at the time of shift change.  Results came back negative.  Patient's symptoms have subsided and his chest pain feels better on evaluation.  Discussed results with patient. He is hemodynamically stable and safe for discharge home.  Prescriptions for Maalox and Thorazine sent to pharmacy.  Return precautions provided.   Maxwell Marion, PA-C 06/01/23 1648    Linwood Dibbles, MD 06/02/23 [phone]

## 2023-06-01 NOTE — ED Triage Notes (Signed)
Pt came in via POV d/t hiccups he has had past 5 days & the pain in his Chest is so bad now he is unable to sleep. Denies and pain radiation & only hurts when he hiccups. A/Ox4, rates pain 10/10 while in triage.

## 2023-06-01 NOTE — ED Provider Notes (Signed)
[facility] EMERGENCY DEPARTMENT AT [facility] Provider Note   CSN: [id number] Arrival date & time: 06/01/23  4098     History  Chief Complaint  Patient presents with   Chest Pain    DHILAN BRAUER is a 72 y.o. male.   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  Denies any fevers or chills nausea or vomiting. He endorses bilateral leg swelling but states that this is actually improved since he began dialysis.     Home Medications Prior to Admission medications   Medication Sig Start Date End Date Taking? Authorizing Provider  amLODipine (NORVASC) 5 MG tablet Take 1 tablet (5 mg total) by mouth daily. 02/28/23   Nafziger, Kandee Keen, NP  aspirin EC 81 MG tablet Take 81 mg by mouth daily.    [provider]  atorvastatin (LIPITOR) 10 MG tablet Take 1 tablet (10 mg total) by mouth every morning. 05/30/23   Nafziger, Kandee Keen, NP  blood glucose meter kit and supplies KIT Dispense based on patient and insurance preference. Use up to four times daily as directed. 08/21/22   Nafziger, Kandee Keen, NP  buPROPion (WELLBUTRIN XL) 300 MG 24 hr tablet TAKE ONE TABLET BY MOUTH EVERY MORNING 01/15/23   Nafziger, Kandee Keen, NP  calcitRIOL (ROCALTROL) 0.25 MCG capsule Take 0.25 mcg by mouth daily. 01/25/23   [provider]  chlorthalidone (HYGROTON) 25 MG tablet Take 25 mg by mouth daily.    [provider]  Continuous Blood Gluc Receiver (FREESTYLE LIBRE 14 DAY READER) DEVI Use with freestyle libre system 06/26/22   Nafziger, Kandee Keen, NP  Continuous Glucose Sensor (FREESTYLE  LIBRE 3 SENSOR) MISC Apply new sensor every 14 days to monitor blood glucose. Remove old sensor before applying new one. 05/30/23   Nafziger, Kandee Keen, NP  Finerenone (KERENDIA) 10 MG TABS Take 10 mg by mouth daily.    [provider]  gabapentin (NEURONTIN) 300 MG capsule TAKE ONE CAPSULE BY MOUTH EVERYDAY AT BEDTIME 05/30/23   Nafziger, Kandee Keen, NP  gentamicin cream (GARAMYCIN) 0.1 % Apply topically daily. 04/23/23   [provider]  glipiZIDE (GLUCOTROL XL) 5 MG 24 hr tablet Take 1 tablet (5 mg total) by mouth 2 (two) times daily. 05/30/23 08/28/23  Nafziger, Kandee Keen, NP  insulin glargine (LANTUS SOLOSTAR) 100 UNIT/ML Solostar Pen Inject 20 Units into the skin daily. 05/30/23 08/28/23  Nafziger, Kandee Keen, NP  Insulin Pen Needle (B-D ULTRAFINE III SHORT PEN) 31G X 8 MM MISC USE WITH LANUTS PEN 12/19/21   Nafziger, Kandee Keen, NP  lactulose (CHRONULAC) 10 GM/15ML solution Take by mouth. 05/15/23   [provider]  Lancets [facility] DELICA PLUS [place]) MISC Apply topically. 08/23/22   [provider]  losartan (COZAAR) 100 MG tablet TAKE ONE TABLET BY MOUTH EVERY MORNING 01/15/23   Nafziger, Kandee Keen, NP  metoCLOPramide (REGLAN) 10 MG tablet Take 1 tablet (10 mg total) by mouth 4 (four) times daily. 05/30/23 06/29/23  Nafziger, Kandee Keen, NP  metoprolol tartrate (LOPRESSOR) 50 MG tablet TAKE ONE TABLET BY MOUTH EVERY MORNING and TAKE ONE TABLET BY MOUTH EVERYDAY AT BEDTIME 04/16/23   Nafziger,  Kandee Keen, NP  [facility] ULTRA test strip USE TO check blood glucose UP TO four times daily AS DIRECTED 08/23/22   [provider]  oxyCODONE-acetaminophen (PERCOCET) 5-325 MG tablet Take 1 tablet by mouth every 6 (six) hours as needed for severe pain. 04/11/23 04/10/24  Baglia, Corrina, PA-C  tiZANidine (ZANAFLEX) 2 MG tablet TAKE ONE TABLET BY MOUTH EVERYDAY AT BEDTIME 05/30/23   Nafziger, Kandee Keen, NP  torsemide (DEMADEX) 20 MG tablet Take 20 mg by mouth 2 (two) times daily.    [provider]       Allergies    Lactose intolerance (gi), Apple pectin [pectin], Lactose, Metformin and related, Peach [prunus persica], and Morphine and codeine    Review of Systems   Review of Systems  Cardiovascular:  Positive for chest pain.    Physical Exam Updated Vital Signs BP 130/70   Pulse 70   Temp 99 F (37.2 C) (Oral)   Resp 18   SpO2 99%  Physical Exam Vitals and nursing note reviewed.  Constitutional:      General: He is not in acute distress.    Comments: Chronically ill-appearing 72 year old male  HENT:     Head: Normocephalic and atraumatic.     Nose: Nose normal.     Mouth/Throat:     Mouth: Mucous membranes are moist.  Eyes:     General: No scleral icterus. Cardiovascular:     Rate and Rhythm: Normal rate and regular rhythm.     Pulses: Normal pulses.     Heart sounds: Normal heart sounds.  Pulmonary:     Effort: Pulmonary effort is normal. No respiratory distress.     Breath sounds: Rales present. No wheezing.     Comments: Faint crackles bilaterally, speaking full sentences, does not appear dyspneic Abdominal:     Palpations: Abdomen is soft.     Tenderness: There is no abdominal tenderness.  Musculoskeletal:     Cervical back: Normal range of motion.     Right lower leg: No edema.     Left lower leg: No edema.  Skin:    General: Skin is warm and dry.     Capillary Refill: Capillary refill takes less than 2 seconds.  Neurological:     Mental Status: He is alert. Mental status is at baseline.     Comments: Right leg able to move toes but otherwise immobile  Left lower extremity normal strength  Bilateral upper extremities diffusely weak  Psychiatric:        Mood and Affect: Mood normal.        Behavior: Behavior normal.     ED Results / Procedures / Treatments   Labs (all labs ordered are listed, but only abnormal results are displayed) Labs Reviewed  BASIC METABOLIC PANEL - Abnormal; Notable for the following components:      Result Value    Sodium 131 (*)    Potassium 2.8 (*)    Chloride 91 (*)    Glucose, Bld 401 (*)    BUN 61 (*)    Creatinine, Ser 3.06 (*)    Calcium 8.0 (*)    GFR, Estimated 21 (*)    All other components within normal limits  CBC - Abnormal; Notable for the following components:   RBC 4.00 (*)    Hemoglobin 12.0 (*)    HCT 36.3 (*)    All other components within normal limits  HEPATIC FUNCTION PANEL - Abnormal; Notable for the following components:   Total Protein 5.3 (*)  Albumin 2.5 (*)    Bilirubin, Direct 0.3 (*)    All other components within normal limits  D-DIMER, QUANTITATIVE - Abnormal; Notable for the following components:   D-Dimer, Quant 2.34 (*)    All other components within normal limits  CBG MONITORING, ED - Abnormal; Notable for the following components:   Glucose-Capillary 222 (*)    All other components within normal limits  TROPONIN I (HIGH SENSITIVITY) - Abnormal; Notable for the following components:   Troponin I (High Sensitivity) 29 (*)    All other components within normal limits  TROPONIN I (HIGH SENSITIVITY) - Abnormal; Notable for the following components:   Troponin I (High Sensitivity) 24 (*)    All other components within normal limits  MAGNESIUM  CBG MONITORING, ED    EKG EKG Interpretation Date/Time:  Saturday June 01 2023 09:26:41 EDT Ventricular Rate:  81 PR Interval:  136 QRS Duration:  82 QT Interval:  390 QTC Calculation: 453 R Axis:   51  Text Interpretation: Normal sinus rhythm Nonspecific T wave abnormality Abnormal ECG When compared with ECG of 11-Oct-2018 11:44, PREVIOUS ECG IS PRESENT No significant change since last tracing Confirmed by Jacalyn Lefevre [phone]) on 06/01/2023 9:59:21 AM  Radiology CT CHEST ABDOMEN PELVIS WO CONTRAST  Result Date: 06/01/2023 CLINICAL DATA:  Epigastric pain and chest pain. Free air under the hemidiaphragm (recent peritoneal dialysis port placement) EXAM: CT CHEST, ABDOMEN AND PELVIS WITHOUT CONTRAST  TECHNIQUE: Multidetector CT imaging of the chest, abdomen and pelvis was performed following the standard protocol without IV contrast. RADIATION DOSE REDUCTION: This exam was performed according to the departmental dose-optimization program which includes automated exposure control, adjustment of the mA and/or kV according to patient size and/or use of iterative reconstruction technique. COMPARISON:  None Available. FINDINGS: CT CHEST FINDINGS Cardiovascular: The heart size is normal. No substantial pericardial effusion. Mild atherosclerotic calcification is noted in the wall of the thoracic aorta. Mediastinum/Nodes: No mediastinal lymphadenopathy. No evidence for gross hilar lymphadenopathy although assessment is limited by the lack of intravenous contrast on the current study. Fluid in the esophagus may be related to dysmotility or reflux. There is no axillary lymphadenopathy. Lungs/Pleura: No focal airspace consolidation or evidence of pulmonary edema. No suspicious pulmonary nodule or mass. Mild cylindrical bronchiectasis noted posterior left lung base. There is dependent atelectasis in the lower lobes, right greater than left. No pleural effusion. Musculoskeletal: No worrisome lytic or sclerotic osseous abnormality. CT ABDOMEN PELVIS FINDINGS Hepatobiliary: No suspicious focal abnormality in the liver on this study without intravenous contrast. There is no evidence for gallstones, gallbladder wall thickening, or pericholecystic fluid. No intrahepatic or extrahepatic biliary dilation. Pancreas: No focal mass lesion. No dilatation of the main duct. No intraparenchymal cyst. No peripancreatic edema. Spleen: No splenomegaly. No suspicious focal mass lesion. Adrenals/Urinary Tract: No adrenal nodule or mass. Kidneys unremarkable. No evidence for hydroureter. The urinary bladder appears normal for the degree of distention. Stomach/Bowel: Stomach is unremarkable. No gastric wall thickening. No evidence of outlet  obstruction. Duodenum is normally positioned as is the ligament of Treitz. No small bowel wall thickening. No small bowel dilatation. The terminal ileum is normal. The appendix is not well visualized, but there is no edema or inflammation in the region of the cecal tip to suggest appendicitis. No gross colonic mass. No colonic wall thickening. Vascular/Lymphatic: There is mild atherosclerotic calcification of the abdominal aorta without aneurysm. There is no gastrohepatic or hepatoduodenal ligament lymphadenopathy. No retroperitoneal or mesenteric lymphadenopathy. No pelvic sidewall lymphadenopathy.  Reproductive: The prostate gland and seminal vesicles are unremarkable. Other: Small to moderate volume intraperitoneal free gas. There is some trace fluid around the spleen but otherwise no substantial intraperitoneal free fluid. Peritoneal dialysis catheter identified entering through the rectus musculature of the right lower anterior abdominal wall and coiled in the right abdomen at about the level of the iliac crest. Musculoskeletal: No worrisome lytic or sclerotic osseous abnormality. IMPRESSION: 1. Small to moderate volume intraperitoneal free gas. Imaging features could be compatible with the presence of an intraperitoneal dialysis catheter if gas has been introduced through the catheter. No gastric, small bowel or colonic wall thickening. There is only a trace amount of free fluid in the abdomen, seen up around the liver. Close clinical correlation will be required in this case to exclude occult hollow viscus perforation. 2. Fluid in the esophagus may be related to dysmotility or reflux. 3. Dependent atelectasis in the lower lobes, right greater than left. 4.  Aortic Atherosclerosis (ICD10-I70.0). Electronically Signed   By: Kennith Center M.D.   On: 06/01/2023 12:55   DG Chest 2 View  Result Date: 06/01/2023 CLINICAL DATA:  Chest pain, hiccups EXAM: CHEST - 2 VIEW COMPARISON:  06/12/2022 FINDINGS: Free  intraperitoneal gas.  Lungs clear. Heart size and mediastinal contours are within normal limits. No effusion. Right shoulder DJD. Vertebral endplate spurring at multiple levels in the mid and lower thoracic spine. IMPRESSION: 1. Free intraperitoneal gas, suggesting perforated viscus in the absence of recent abdominal surgery. Critical Value/emergent results were called by telephone at the time of interpretation on 06/01/2023 at 10:06 am to provider [facility] , who verbally acknowledged these results. 2. No acute cardiopulmonary disease. Electronically Signed   By: Corlis Leak M.D.   On: 06/01/2023 10:06    Procedures Procedures    Medications Ordered in ED Medications  fentaNYL (SUBLIMAZE) injection 50 mcg (50 mcg Intravenous Given 06/01/23 1131)  metoCLOPramide (REGLAN) injection 10 mg (10 mg Intravenous Given 06/01/23 1130)  diphenhydrAMINE (BENADRYL) injection 12.5 mg (12.5 mg Intravenous Given 06/01/23 1129)  sodium chloride 0.9 % bolus 500 mL (0 mLs Intravenous Stopped 06/01/23 1218)  alum & mag hydroxide-simeth (MAALOX/MYLANTA) 200-200-20 MG/5ML suspension 30 mL (30 mLs Oral Given 06/01/23 1430)  chlorproMAZINE (THORAZINE) 25 mg in sodium chloride 0.9 % 25 mL IVPB (0 mg Intravenous Stopped 06/01/23 1519)  technetium albumin aggregated (MAA) injection solution 4.3 millicurie (4.3 millicuries Intravenous Contrast Given 06/01/23 1529)    ED Course/ Medical Decision Making/ A&P Clinical Course as of 06/01/23 1546  Sat Jun 01, 2023  1046 3 DIALYSIS  Monday sx began.  Surgery was completed 8/8 [WF]  1338 Discussed with Marya Amsler NM tech -- evening or tomorrow study (VQ) [WF]    Clinical Course User Index [WF] Gailen Shelter, PA                                 Medical Decision Making Amount and/or Complexity of Data Reviewed Labs: ordered. Radiology: ordered.  Risk OTC drugs. Prescription drug management.   This patient presents to the ED for concern of CP, this involves a  number of treatment options, and is a complaint that carries with it a moderate to high risk of complications and morbidity. A differential diagnosis was considered for the patient's symptoms which is discussed below:   The emergent causes of chest pain include: Acute coronary syndrome, tamponade, pericarditis/myocarditis, aortic dissection, pulmonary embolism, tension pneumothorax,  pneumonia, and esophageal rupture.    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  Denies any fevers or chills nausea or vomiting. He endorses bilateral leg swelling but states that this is actually improved since he began dialysis.     EMR reviewed including pt PMHx, past surgical history and past visits to ER.   See HPI for more details   Lab Tests:  Abnormal labs include mild hyponatremia and hypokalemia and hypochloremia blood sugar is elevated at 401   Imaging Studies:    NM VQ scan pending.  CT chest abdomen pelvis without contrast without any acute abnormal findings.    Cardiac Monitoring:  The patient was maintained on a cardiac monitor.  I personally viewed and interpreted the cardiac monitored which showed an underlying rhythm of: NSR EKG non-ischemic   Medicines ordered:  I ordered medication including normal saline 500 mL, Reglan, Benadryl, fentanyl, insulin, GI cocktail for chest pain with minimal relief Reevaluation of the patient after these medicines showed that the patient stayed the same I have reviewed the patients home  medicines and have made adjustments as needed   Critical Interventions:     Consults/Attending Physician  I discussed this case with my attending physician who cosigned this note including patient's presenting symptoms, physical exam, and planned diagnostics and interventions. Attending physician stated agreement with plan or made changes to plan which were implemented.   Attending physician assessed patient at bedside.   Reevaluation:  After the interventions noted above I re-evaluated patient and found that they have :stayed the same   Social Determinants of Health:      Problem List / ED Course:  Hiccups in the setting of some degree of chest discomfort.  Likely reflux, free air under the diaphragm most likely due to recently placed peritoneal dialysis port.  Patient is not peritonitic on my exam doubt hollow viscus injury or perforation.  VQ scan to rule stratify for PE I have a low pretest suspicion for PE.  If negative anticipate discharge home with Reglan that they are already prescribed.   Patient care handed off to evening team 3:46 PM   Final Clinical Impression(s) / ED Diagnoses Final diagnoses:  Nonspecific chest pain  Hiccup    Rx / DC Orders ED Discharge Orders     None         Gailen Shelter, [place] 06/01/23 1546    Jacalyn Lefevre, MD 06/02/23 1552

## 2023-06-01 NOTE — Discharge Instructions (Signed)
Please follow-up with your primary care provider.  Return to emergency room for any new or concerning or concerning symptoms

## 2023-06-04 ENCOUNTER — Ambulatory Visit (INDEPENDENT_AMBULATORY_CARE_PROVIDER_SITE_OTHER): Payer: PPO | Admitting: Adult Health

## 2023-06-04 ENCOUNTER — Encounter: Payer: Self-pay | Admitting: Adult Health

## 2023-06-04 VITALS — BP 120/60 | HR 73 | Temp 98.1°F | Ht 69.0 in

## 2023-06-04 DIAGNOSIS — R066 Hiccough: Secondary | ICD-10-CM

## 2023-06-04 DIAGNOSIS — Z992 Dependence on renal dialysis: Secondary | ICD-10-CM | POA: Diagnosis not present

## 2023-06-04 DIAGNOSIS — E1122 Type 2 diabetes mellitus with diabetic chronic kidney disease: Secondary | ICD-10-CM | POA: Diagnosis not present

## 2023-06-04 DIAGNOSIS — Z4932 Encounter for adequacy testing for peritoneal dialysis: Secondary | ICD-10-CM | POA: Diagnosis not present

## 2023-06-04 DIAGNOSIS — R82998 Other abnormal findings in urine: Secondary | ICD-10-CM | POA: Diagnosis not present

## 2023-06-04 DIAGNOSIS — N2589 Other disorders resulting from impaired renal tubular function: Secondary | ICD-10-CM | POA: Diagnosis not present

## 2023-06-04 DIAGNOSIS — K769 Liver disease, unspecified: Secondary | ICD-10-CM | POA: Diagnosis not present

## 2023-06-04 DIAGNOSIS — Z79899 Other long term (current) drug therapy: Secondary | ICD-10-CM | POA: Diagnosis not present

## 2023-06-04 DIAGNOSIS — R0789 Other chest pain: Secondary | ICD-10-CM

## 2023-06-04 DIAGNOSIS — D631 Anemia in chronic kidney disease: Secondary | ICD-10-CM | POA: Diagnosis not present

## 2023-06-04 DIAGNOSIS — N2581 Secondary hyperparathyroidism of renal origin: Secondary | ICD-10-CM | POA: Diagnosis not present

## 2023-06-04 DIAGNOSIS — D509 Iron deficiency anemia, unspecified: Secondary | ICD-10-CM | POA: Diagnosis not present

## 2023-06-04 DIAGNOSIS — E440 Moderate protein-calorie malnutrition: Secondary | ICD-10-CM | POA: Diagnosis not present

## 2023-06-04 DIAGNOSIS — N186 End stage renal disease: Secondary | ICD-10-CM | POA: Diagnosis not present

## 2023-06-04 NOTE — Progress Notes (Signed)
Subjective:    Patient ID: Russell Austin, male    DOB: 06-09-1951, 72 y.o.   MRN: [id number]  HPI  Russell Austin to the office today for follow-up after being seen in the emergency room days ago.  He presented to the emergency room with complaints of sharp pleuritic chest pain that was located in the sternal area but have been ongoing for 5 days worse with hiccups.   In the ER his lab test showed mild hyponatremia and hypokalemia and hypochloremia his blood sugar was elevated at 401.  CT chest abdomen pelvis without contrast did not show any acute abnormal findings.  Q scan without acute pulmonary embolism.  EKG showed no concern for ischemia.  Dems had subsided and his chest pain felt better on reevaluation.  He was discharged home with prescriptions for Maalox and Thorazine.  Today he is with his wife. He reports he is feeling a lot better. His hiccups are gone and his chest pain is improving from the hiccups. After the dose of Thorazine in the hospital he has not had to take any additional.   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   Chronic kidney disease    Diabetes mellitus type II    Diabetic retinopathy FOLLOWED BY DR Luciana Axe   ED (erectile dysfunction)    Glaucoma of both eyes    Hypertension    Left hydrocele    Stroke [facility])     Social History   Socioeconomic History   Marital status: Married    Spouse name: Not on file   Number of children: 1   Years of education: 12   Highest education level: 12th grade  Occupational History   Occupation: Disabled  Tobacco Use   Smoking status: Never   Smokeless tobacco: Never  Vaping Use   Vaping status: Never Used  Substance and Sexual Activity   Alcohol use: No   Drug use: No   Sexual activity: Not on file  Other Topics Concern   Not on file  Social History Narrative   Lives at home with his wife and granddaughter.    Left-handed.   3 cups caffeine per day.   Social Determinants of Health   Financial Resource Strain: Low Risk  (01/21/2023)   Overall Financial Resource Strain (CARDIA)    Difficulty of Paying Living Expenses: Not hard at all  Food Insecurity: No Food Insecurity (01/21/2023)   Hunger Vital Sign    Worried About Running Out of Food in the Last Year: Never true    Ran Out of Food in the Last Year: Never true  Transportation Needs: No Transportation Needs (01/21/2023)   PRAPARE - Administrator, Civil Service (Medical): No    Lack of Transportation (Non-Medical): No  Physical Activity: Inactive (01/21/2023)   Exercise Vital Sign    Days of Exercise per Week: 0 days    Minutes of Exercise per Session: 0 min  Stress: No Stress Concern Present (01/21/2023)   [employer] of Occupational Health - Occupational Stress Questionnaire    Feeling of Stress : Not at all  Social Connections: Socially Integrated (01/21/2023)   Social Connection and Isolation Panel [NHANES]    Frequency of Communication with Friends and Family: More than three times a week    Frequency of Social Gatherings with Friends and Family: More than three times a week  Attends Religious Services: More than 4 times per year    Active Member of Clubs or Organizations: Yes    Attends Banker Meetings: More than 4 times per year    Marital Status: Married  Catering manager Violence: Not At Risk (01/21/2023)   Humiliation, Afraid, Rape, and Kick questionnaire    Fear of Current or Ex-Partner: No    Emotionally Abused: No    Physically Abused: No    Sexually Abused: No    Past Surgical History:  Procedure Laterality Date   APPENDECTOMY  AGE EARLY 20'S   CAPD INSERTION N/A 04/11/2023   Procedure: LAPAROSCOPIC INSERTION CONTINUOUS AMBULATORY PERITONEAL DIALYSIS  (CAPD) CATHETER WITH  OMENTOPEXY;  Surgeon: Leonie Douglas, MD;  Location: [facility];  Service: Vascular;  Laterality: N/A;   COLONOSCOPY   12/30/2020   every 5 years   HYDROCELE EXCISION  03/31/2012   Procedure: HYDROCELECTOMY ADULT;  Surgeon: Marcine Matar, MD;  Location[facility];  Service: Urology;  Laterality: Left;  45 MINS     LEFT EYE LASER RETINA REPAIR  SEPT 2012   RIGHT EYE VITRECTOMY/ INSERTION GLAUCOMA SETON/ LASER REPAIR  12-13-2008   RETINAL ARTERY OCCLUSION /NEOVASCULAR GLAUCOMA/ HEMORRHAGE   RIGHT EYE VITRETOMY/ INSERTION GLAUCOMA SETON X2/ LASER  03-24-2009   RECURRENT HEMORRHAGE/ OCCLUSION INTERNAL SETON   SHOULDER ARTHROSCOPY Right 2005   undescended right testicle removed  1994    Family History  Problem Relation Age of Onset   Glaucoma Mother    Diabetes Mother    Stomach cancer Maternal Grandfather    Stroke Maternal Grandmother     Allergies  Allergen Reactions   Lactose Intolerance (Gi) Diarrhea   Apple Pectin [Pectin] Itching    ITCHY THROAT   Lactose Other (See Comments)   Metformin And Related     D/t decreased kidney function    Peach [Prunus Persica] Itching    ITCHY THROAT   Morphine And Codeine Anxiety    Jittery    Current Outpatient Medications on File Prior to Visit  Medication Sig Dispense Refill   alum & mag hydroxide-simeth (MAALOX MAX) 400-400-40 MG/5ML suspension Take 10 mLs by mouth every 6 (six) hours as needed for up to 7 days for indigestion. 280 mL 0   amLODipine (NORVASC) 5 MG tablet Take 1 tablet (5 mg total) by mouth daily. 90 tablet 3   aspirin EC 81 MG tablet Take 81 mg by mouth daily.     atorvastatin (LIPITOR) 10 MG tablet Take 1 tablet (10 mg total) by mouth every morning. 90 tablet 3   blood glucose meter kit and supplies KIT Dispense based on patient and insurance preference. Use up to four times daily as directed. 1 each 0   buPROPion (WELLBUTRIN XL) 300 MG 24 hr tablet TAKE ONE TABLET BY MOUTH EVERY MORNING 90 tablet 1   calcitRIOL (ROCALTROL) 0.25 MCG capsule Take 0.25 mcg by mouth daily.     chlorproMAZINE (THORAZINE) 25 MG  tablet Take 1 tablet (25 mg total) by mouth 3 (three) times daily as needed for up to 3 days for hiccoughs. 9 tablet 0   chlorthalidone (HYGROTON) 25 MG tablet Take 25 mg by mouth daily.     Continuous Blood Gluc Receiver (FREESTYLE LIBRE 14 DAY READER) DEVI Use with freestyle libre system 1 each 0   Continuous Glucose Sensor (FREESTYLE LIBRE 3 SENSOR) MISC Apply new sensor every 14 days to monitor blood glucose. Remove old sensor before applying new one.  2 each 6   Finerenone (KERENDIA) 10 MG TABS Take 10 mg by mouth daily.     gabapentin (NEURONTIN) 300 MG capsule TAKE ONE CAPSULE BY MOUTH EVERYDAY AT BEDTIME 90 capsule 3   gentamicin cream (GARAMYCIN) 0.1 % Apply topically daily.     glipiZIDE (GLUCOTROL XL) 5 MG 24 hr tablet Take 1 tablet (5 mg total) by mouth 2 (two) times daily. 180 tablet 1   insulin glargine (LANTUS SOLOSTAR) 100 UNIT/ML Solostar Pen Inject 20 Units into the skin daily. 20 mL 0   Insulin Pen Needle (B-D ULTRAFINE III SHORT PEN) 31G X 8 MM MISC USE WITH LANUTS PEN 100 each 3   lactulose (CHRONULAC) 10 GM/15ML solution Take by mouth.     Lancets (ONETOUCH DELICA PLUS LANCET33G) MISC Apply topically.     losartan (COZAAR) 100 MG tablet TAKE ONE TABLET BY MOUTH EVERY MORNING 90 tablet 1   metoprolol tartrate (LOPRESSOR) 50 MG tablet TAKE ONE TABLET BY MOUTH EVERY MORNING and TAKE ONE TABLET BY MOUTH EVERYDAY AT BEDTIME 180 tablet 3   ONETOUCH ULTRA test strip USE TO check blood glucose UP TO four times daily AS DIRECTED     oxyCODONE-acetaminophen (PERCOCET) 5-325 MG tablet Take 1 tablet by mouth every 6 (six) hours as needed for severe pain. 20 tablet 0   tiZANidine (ZANAFLEX) 2 MG tablet TAKE ONE TABLET BY MOUTH EVERYDAY AT BEDTIME 90 tablet 3   torsemide (DEMADEX) 20 MG tablet Take 20 mg by mouth 2 (two) times daily.     No current facility-administered medications on file prior to visit.    BP 120/60   Pulse 73   Temp 98.1 F (36.7 C) (Oral)   Ht 5\' 9"  (1.753 m)    SpO2 99%   BMI 23.33 kg/m       Objective:   Physical Exam Vitals and nursing note reviewed.  Constitutional:      Appearance: Normal appearance.  Cardiovascular:     Rate and Rhythm: Normal rate and regular rhythm.     Pulses: Normal pulses.     Heart sounds: Normal heart sounds.  Pulmonary:     Effort: Pulmonary effort is normal.     Breath sounds: Normal breath sounds.  Neurological:     Mental Status: He is alert.  Psychiatric:        Mood and Affect: Mood normal.        Behavior: Behavior normal.        Judgment: Judgment normal.       Assessment & Plan:  1. Hiccups - Have  resolved  - Has thorazine at home to take if hiccups return   2. Other chest pain - Improving since hiccups have stopped.  - Follow up as needed  Shirline Frees, NP  Time spent with patient today was 31 minutes which consisted of chart review, discussing hiccups, peritoneal dialysis and chest pain and work up, treatment answering questions and documentation.

## 2023-06-10 ENCOUNTER — Other Ambulatory Visit: Payer: Self-pay | Admitting: Adult Health

## 2023-06-10 DIAGNOSIS — F339 Major depressive disorder, recurrent, unspecified: Secondary | ICD-10-CM

## 2023-06-10 DIAGNOSIS — I10 Essential (primary) hypertension: Secondary | ICD-10-CM

## 2023-06-10 NOTE — Telephone Encounter (Signed)
Prescription Request  06/10/2023  LOV: 06/04/2023  What is the name of the medication or equipment?     amLODipine (NORVASC) 5 MG tablet,    buPROPion (WELLBUTRIN XL) 300 MG 24 hr tablet,    chlorthalidone (HYGROTON) 25 MG tablet,    losartan (COZAAR) 100 MG tablet,      metoprolol tartrate (LOPRESSOR) 50 MG tablet,tiZANidine (ZANAFLEX) 2 MG tablet ,  Have you contacted your pharmacy to request a refill? No   Which pharmacy would you like this sent to?  [id number] [place], [place] - [address] [place] [address] Phone: [phone] Fax: [phone]    Patient notified that their request is being sent to the clinical staff for review and that they should receive a response within 2 business days.   Please advise at Mobile [phone] (mobile)

## 2023-06-11 MED ORDER — BUPROPION HCL ER (XL) 300 MG PO TB24: 300.0000 mg | ORAL_TABLET | Freq: Every morning | ORAL | 1 refills | Status: DC

## 2023-06-11 MED ORDER — METOPROLOL TARTRATE 50 MG PO TABS: 50.0000 mg | ORAL_TABLET | Freq: Two times a day (BID) | ORAL | 3 refills | Status: DC

## 2023-06-11 MED ORDER — AMLODIPINE BESYLATE 5 MG PO TABS: 5.0000 mg | ORAL_TABLET | Freq: Every day | ORAL | 3 refills | Status: DC

## 2023-06-11 MED ORDER — LOSARTAN POTASSIUM 100 MG PO TABS: 100.0000 mg | ORAL_TABLET | Freq: Every morning | ORAL | 1 refills | Status: DC

## 2023-06-12 NOTE — Telephone Encounter (Signed)
Spoke to pt and he stated that he never had the hygroton and did not ask for it. No further actions needed.

## 2023-06-18 ENCOUNTER — Ambulatory Visit: Payer: PPO | Admitting: Podiatry

## 2023-06-18 ENCOUNTER — Encounter: Payer: Self-pay | Admitting: Podiatry

## 2023-06-18 DIAGNOSIS — M79675 Pain in left toe(s): Secondary | ICD-10-CM

## 2023-06-18 DIAGNOSIS — B351 Tinea unguium: Secondary | ICD-10-CM | POA: Diagnosis not present

## 2023-06-18 DIAGNOSIS — Z794 Long term (current) use of insulin: Secondary | ICD-10-CM | POA: Diagnosis not present

## 2023-06-18 DIAGNOSIS — M79674 Pain in right toe(s): Secondary | ICD-10-CM

## 2023-06-18 DIAGNOSIS — E1140 Type 2 diabetes mellitus with diabetic neuropathy, unspecified: Secondary | ICD-10-CM

## 2023-06-18 DIAGNOSIS — E119 Type 2 diabetes mellitus without complications: Secondary | ICD-10-CM

## 2023-06-18 DIAGNOSIS — L84 Corns and callosities: Secondary | ICD-10-CM

## 2023-06-18 NOTE — Progress Notes (Signed)
Subjective:  Patient ID: Russell Austin, male    DOB: 05/11/1951,   MRN: [id number]  Chief Complaint  Patient presents with   Nail Problem    Rfc, pt states that his heels are very sore.    72 y.o. male presents for concern of thickened elongated and painful nails that are difficult to trim. Requesting to have them trimmed today. Relates burning and tingling in their feet. Patient is diabetic and last A1c was  Lab Results  Component Value Date   HGBA1C 10.2 (A) 05/30/2023   .   PCP:  Shirline Frees, NP    . Denies any other pedal complaints. Denies n/v/f/c.   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 CFT <3 seconds. Absent hair growth on digits. Edema noted to bilateral lower extremities. Xerosis noted bilaterally.  Skin. No lacerations or abrasions bilateral feet. Nails 1-5 bilateral  are thickened discolored and elongated with subungual debris. Hyperkeratotic lesion noted plantar left heel and posterior right heel.  Musculoskeletal: MMT 5/5 bilateral lower extremities in DF, PF, Inversion and Eversion. Deceased ROM in DF of ankle joint. Mild HAV deformity bilateral and hammered digits 2-5 bilateral.  Neurological: Sensation intact to light touch. Protective sensation diminished bilateral.    Assessment:   1. Pain due to onychomycosis of toenails of both feet   2. Type 2 diabetes mellitus with diabetic neuropathy, with long-term current use of insulin (HCC)       Plan:  Patient was evaluated and treated and all questions answered. -Discussed and educated patient on diabetic foot care, especially with  regards to the vascular,  neurological and musculoskeletal systems.  -Stressed the importance of good glycemic control and the detriment of not  controlling glucose levels in relation to the foot. -Discussed supportive shoes at all times and checking feet regularly.  -Mechanically debrided all nails 1-5 bilateral using sterile nail nipper and filed with dremel without incident  -Hyperkeratotc area debrided with chisel without incident.  -Answered all patient questions -Patient to return  in 3 months for at risk foot care -Patient advised to call the office if any problems or questions arise in the meantime.   Louann Sjogren, DPM

## 2023-06-18 NOTE — Addendum Note (Signed)
Addended by: Louann Sjogren R on: 06/18/2023 08:31 AM   Modules accepted: Level of Service

## 2023-06-21 DIAGNOSIS — E113392 Type 2 diabetes mellitus with moderate nonproliferative diabetic retinopathy without macular edema, left eye: Secondary | ICD-10-CM | POA: Diagnosis not present

## 2023-06-21 DIAGNOSIS — H40012 Open angle with borderline findings, low risk, left eye: Secondary | ICD-10-CM | POA: Diagnosis not present

## 2023-06-21 DIAGNOSIS — H524 Presbyopia: Secondary | ICD-10-CM | POA: Diagnosis not present

## 2023-06-21 DIAGNOSIS — H43822 Vitreomacular adhesion, left eye: Secondary | ICD-10-CM | POA: Diagnosis not present

## 2023-06-21 DIAGNOSIS — Z794 Long term (current) use of insulin: Secondary | ICD-10-CM | POA: Diagnosis not present

## 2023-06-26 ENCOUNTER — Encounter: Payer: Self-pay | Admitting: Adult Health

## 2023-06-26 ENCOUNTER — Ambulatory Visit: Payer: PPO | Admitting: Adult Health

## 2023-06-26 VITALS — BP 100/50 | HR 71 | Temp 98.0°F | Ht 69.0 in | Wt 161.0 lb

## 2023-06-26 DIAGNOSIS — Z794 Long term (current) use of insulin: Secondary | ICD-10-CM

## 2023-06-26 DIAGNOSIS — G8929 Other chronic pain: Secondary | ICD-10-CM | POA: Diagnosis not present

## 2023-06-26 DIAGNOSIS — M545 Low back pain, unspecified: Secondary | ICD-10-CM | POA: Diagnosis not present

## 2023-06-26 MED ORDER — LANTUS SOLOSTAR 100 UNIT/ML ~~LOC~~ SOPN
20.0000 [IU] | PEN_INJECTOR | Freq: Two times a day (BID) | SUBCUTANEOUS | 0 refills | Status: DC
Start: 2023-06-26 — End: 2023-12-05

## 2023-06-26 MED ORDER — NOVOLOG FLEXPEN 100 UNIT/ML ~~LOC~~ SOPN: 5.0000 [IU] | PEN_INJECTOR | Freq: Three times a day (TID) | SUBCUTANEOUS | 1 refills | Status: DC

## 2023-06-26 NOTE — Progress Notes (Signed)
Subjective:    Patient ID: Russell Austin, male    DOB: [birth year], 72 y.o.   MRN: [id number]  Diabetes  He presents to the office today for follow-up regarding diabetes mellitus.  He was last seen roughly a month ago after his he started peritoneal dialysis.  Since starting peritoneal dialysis his blood sugars have been elevated.  When he was last seen his Lantus was increased from 14 units daily to 20 units daily and glipizide was increased from 2.5 mg twice daily to 5 mg twice daily.  His wife has been checking his blood sugars on a routine basis and they continue to be elevated his 7-day average being 365 and his 14-day average being 292.  He also has chronic back pain that seems to be getting worse.  Back in 2020 he was seen at [facility] and had ESI done.  He did have some benefit from [facility] but continued to have significant pain.  We discussed having a microdiscectomy but this was during the pandemic and [facility] was not sure that they can do the surgery at that time.  He was advised to follow-up after COVID got better but he never did.  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   Chronic kidney disease    Diabetes mellitus type II    Diabetic retinopathy FOLLOWED BY DR Luciana Axe   ED (erectile dysfunction)    Glaucoma of both eyes    Hypertension    Left hydrocele    Stroke [facility])     Social History   Socioeconomic History   Marital status: Married    Spouse name: Not on file   Number of children: 1   Years of education: 12   Highest education level: 12th grade  Occupational History   Occupation: Disabled  Tobacco Use   Smoking status: Never   Smokeless tobacco: Never  Vaping Use   Vaping  status: Never Used  Substance and Sexual Activity   Alcohol use: No   Drug use: No   Sexual activity: Not on file  Other Topics Concern   Not on file  Social History Narrative   Lives at home with his wife and granddaughter.   Left-handed.   3 cups caffeine per day.   Social Determinants of Health   Financial Resource Strain: Low Risk  (01/21/2023)   Overall Financial Resource Strain (CARDIA)    Difficulty of Paying Living Expenses: Not hard at all  Food Insecurity: No Food Insecurity (01/21/2023)   Hunger Vital Sign    Worried About Running Out of Food in the Last Year: Never true    Ran Out of Food in the Last Year: Never true  Transportation Needs: No Transportation Needs (01/21/2023)   PRAPARE - Administrator, Civil Service (Medical): No    Lack of Transportation (Non-Medical): No  Physical Activity: Inactive (01/21/2023)   Exercise Vital Sign    Days of Exercise per Week: 0 days    Minutes of Exercise per Session: 0 min  Stress: No Stress Concern Present (01/21/2023)   [employer] of Occupational Health - Occupational Stress Questionnaire    Feeling of Stress : Not at  all  Social Connections: Socially Integrated (01/21/2023)   Social Connection and Isolation Panel [NHANES]    Frequency of Communication with Friends and Family: More than three times a week    Frequency of Social Gatherings with Friends and Family: More than three times a week    Attends Religious Services: More than 4 times per year    Active Member of Clubs or Organizations: Yes    Attends Banker Meetings: More than 4 times per year    Marital Status: Married  Catering manager Violence: Not At Risk (01/21/2023)   Humiliation, Afraid, Rape, and Kick questionnaire    Fear of Current or Ex-Partner: No    Emotionally Abused: No    Physically Abused: No    Sexually Abused: No    Past Surgical History:  Procedure Laterality Date   APPENDECTOMY  AGE EARLY 20'S   CAPD INSERTION  N/A 04/11/2023   Procedure: LAPAROSCOPIC INSERTION CONTINUOUS AMBULATORY PERITONEAL DIALYSIS  (CAPD) CATHETER WITH  OMENTOPEXY;  Surgeon: Leonie Douglas, MD;  Location: [facility] OR;  Service: Vascular;  Laterality: N/A;   COLONOSCOPY  12/30/2020   every 5 years   HYDROCELE EXCISION  03/31/2012   Procedure: HYDROCELECTOMY ADULT;  Surgeon: Marcine Matar, MD;  Location: [facility];  Service: Urology;  Laterality: Left;  45 MINS     LEFT EYE LASER RETINA REPAIR  SEPT 2012   RIGHT EYE VITRECTOMY/ INSERTION GLAUCOMA SETON/ LASER REPAIR  12-13-2008   RETINAL ARTERY OCCLUSION /NEOVASCULAR GLAUCOMA/ HEMORRHAGE   RIGHT EYE VITRETOMY/ INSERTION GLAUCOMA SETON X2/ LASER  03-24-2009   RECURRENT HEMORRHAGE/ OCCLUSION INTERNAL SETON   SHOULDER ARTHROSCOPY Right 2005   undescended right testicle removed  1994    Family History  Problem Relation Age of Onset   Glaucoma Mother    Diabetes Mother    Stomach cancer Maternal Grandfather    Stroke Maternal Grandmother     Allergies  Allergen Reactions   Lactose Intolerance (Gi) Diarrhea   Apple Pectin [Pectin] Itching    ITCHY THROAT   Lactose Other (See Comments)   Metformin And Related     D/t decreased kidney function    Peach [Prunus Persica] Itching    ITCHY THROAT   Morphine And Codeine Anxiety    Jittery    Current Outpatient Medications on File Prior to Visit  Medication Sig Dispense Refill   amLODipine (NORVASC) 5 MG tablet Take 1 tablet (5 mg total) by mouth daily. 90 tablet 3   aspirin EC 81 MG tablet Take 81 mg by mouth daily.     atorvastatin (LIPITOR) 10 MG tablet Take 1 tablet (10 mg total) by mouth every morning. 90 tablet 3   blood glucose meter kit and supplies KIT Dispense based on patient and insurance preference. Use up to four times daily as directed. 1 each 0   buPROPion (WELLBUTRIN XL) 300 MG 24 hr tablet Take 1 tablet (300 mg total) by mouth every morning. 90 tablet 1   calcitRIOL (ROCALTROL) 0.25 MCG  capsule Take 0.25 mcg by mouth daily.     chlorthalidone (HYGROTON) 25 MG tablet Take 25 mg by mouth daily.     Continuous Blood Gluc Receiver (FREESTYLE LIBRE 14 DAY READER) DEVI Use with freestyle libre system 1 each 0   Continuous Glucose Sensor (FREESTYLE LIBRE 3 SENSOR) MISC Apply new sensor every 14 days to monitor blood glucose. Remove old sensor before applying new one. 2 each 6   gabapentin (NEURONTIN) 300 MG  capsule TAKE ONE CAPSULE BY MOUTH EVERYDAY AT BEDTIME 90 capsule 3   gentamicin cream (GARAMYCIN) 0.1 % Apply topically daily.     glipiZIDE (GLUCOTROL XL) 5 MG 24 hr tablet Take 1 tablet (5 mg total) by mouth 2 (two) times daily. 180 tablet 1   insulin glargine (LANTUS SOLOSTAR) 100 UNIT/ML Solostar Pen Inject 20 Units into the skin daily. 20 mL 0   Insulin Pen Needle (B-D ULTRAFINE III SHORT PEN) 31G X 8 MM MISC USE WITH LANUTS PEN 100 each 3   lactulose (CHRONULAC) 10 GM/15ML solution Take by mouth.     Lancets (ONETOUCH DELICA PLUS LANCET33G) MISC Apply topically.     losartan (COZAAR) 100 MG tablet Take 1 tablet (100 mg total) by mouth every morning. 90 tablet 1   metoprolol tartrate (LOPRESSOR) 50 MG tablet Take 1 tablet (50 mg total) by mouth 2 (two) times daily. 180 tablet 3   ONETOUCH ULTRA test strip USE TO check blood glucose UP TO four times daily AS DIRECTED     oxyCODONE-acetaminophen (PERCOCET) 5-325 MG tablet Take 1 tablet by mouth every 6 (six) hours as needed for severe pain. 20 tablet 0   tiZANidine (ZANAFLEX) 2 MG tablet TAKE ONE TABLET BY MOUTH EVERYDAY AT BEDTIME 90 tablet 3   torsemide (DEMADEX) 20 MG tablet Take 20 mg by mouth 2 (two) times daily.     chlorproMAZINE (THORAZINE) 25 MG tablet Take 1 tablet (25 mg total) by mouth 3 (three) times daily as needed for up to 3 days for hiccoughs. (Patient not taking: Reported on 06/26/2023) 9 tablet 0   Finerenone (KERENDIA) 10 MG TABS Take 10 mg by mouth daily. (Patient not taking: Reported on 06/26/2023)     No  current facility-administered medications on file prior to visit.    BP (!) 100/50   Pulse 71   Temp 98 F (36.7 C) (Oral)   Ht 5\' 9"  (1.753 m)   Wt 161 lb (73 kg)   SpO2 97%   BMI 23.78 kg/m       Objective:   Physical Exam Vitals and nursing note reviewed.  Constitutional:      Appearance: Normal appearance. He is ill-appearing (chronically).  Cardiovascular:     Rate and Rhythm: Normal rate and regular rhythm.     Pulses: Normal pulses.     Heart sounds: Normal heart sounds.  Pulmonary:     Effort: Pulmonary effort is normal.     Breath sounds: Normal breath sounds.  Musculoskeletal:        General: Normal range of motion.  Skin:    General: Skin is warm and dry.     Capillary Refill: Capillary refill takes less than 2 seconds.  Neurological:     General: No focal deficit present.     Mental Status: He is alert and oriented to person, place, and time.  Psychiatric:        Mood and Affect: Mood normal.        Behavior: Behavior normal.        Thought Content: Thought content normal.        Judgment: Judgment normal.       Assessment & Plan:  1. Current use of insulin (HCC) -Will increase Lantus to 20 units twice daily and add NovoLog 5 units 3 times daily before meals.  His wife will send me a MyChart message over the weekend to let me know how his blood sugars are responding. - insulin glargine (LANTUS  SOLOSTAR) 100 UNIT/ML Solostar Pen; Inject 20 Units into the skin 2 (two) times daily.  Dispense: 36 mL; Refill: 0 - insulin aspart (NOVOLOG FLEXPEN) 100 UNIT/ML FlexPen; Inject 5 Units into the skin 3 (three) times daily with meals.  Dispense: 15 mL; Refill: 1  2. Chronic midline low back pain without sciatica  - Ambulatory referral to Neurosurgery   Shirline Frees, NP

## 2023-07-01 ENCOUNTER — Telehealth: Payer: Self-pay | Admitting: Adult Health

## 2023-07-01 NOTE — Telephone Encounter (Signed)
Prescription Request  07/01/2023  LOV: 06/26/2023  What is the name of the medication or equipment? FreeStyle Libre 3 Sensor Continuous Glucose Sensor (FREESTYLE LIBRE 3 SENSOR) MISC  Pt informed NP is OOO on Mondays.  Have you contacted your pharmacy to request a refill? No   Which pharmacy would you like this sent to?  [id number] [place], [place] - [address]  [address] Phone: [phone] Fax: [phone]    Patient notified that their request is being sent to the clinical staff for review and that they should receive a response within 2 business days.   Please advise at Mobile [phone] (mobile)

## 2023-07-03 ENCOUNTER — Other Ambulatory Visit: Payer: Self-pay | Admitting: Adult Health

## 2023-07-03 DIAGNOSIS — Z794 Long term (current) use of insulin: Secondary | ICD-10-CM

## 2023-07-03 NOTE — Telephone Encounter (Signed)
Pt notified that he has refills at the pharmacy

## 2023-07-04 DIAGNOSIS — Z992 Dependence on renal dialysis: Secondary | ICD-10-CM | POA: Diagnosis not present

## 2023-07-04 DIAGNOSIS — N186 End stage renal disease: Secondary | ICD-10-CM | POA: Diagnosis not present

## 2023-07-04 DIAGNOSIS — E1122 Type 2 diabetes mellitus with diabetic chronic kidney disease: Secondary | ICD-10-CM | POA: Diagnosis not present

## 2023-07-05 ENCOUNTER — Telehealth: Payer: Self-pay

## 2023-07-05 DIAGNOSIS — K769 Liver disease, unspecified: Secondary | ICD-10-CM | POA: Diagnosis not present

## 2023-07-05 DIAGNOSIS — N186 End stage renal disease: Secondary | ICD-10-CM | POA: Diagnosis not present

## 2023-07-05 DIAGNOSIS — R82998 Other abnormal findings in urine: Secondary | ICD-10-CM | POA: Diagnosis not present

## 2023-07-05 DIAGNOSIS — D509 Iron deficiency anemia, unspecified: Secondary | ICD-10-CM | POA: Diagnosis not present

## 2023-07-05 DIAGNOSIS — D631 Anemia in chronic kidney disease: Secondary | ICD-10-CM | POA: Diagnosis not present

## 2023-07-05 DIAGNOSIS — N2581 Secondary hyperparathyroidism of renal origin: Secondary | ICD-10-CM | POA: Diagnosis not present

## 2023-07-05 DIAGNOSIS — E440 Moderate protein-calorie malnutrition: Secondary | ICD-10-CM | POA: Diagnosis not present

## 2023-07-05 DIAGNOSIS — Z4932 Encounter for adequacy testing for peritoneal dialysis: Secondary | ICD-10-CM | POA: Diagnosis not present

## 2023-07-05 DIAGNOSIS — N2589 Other disorders resulting from impaired renal tubular function: Secondary | ICD-10-CM | POA: Diagnosis not present

## 2023-07-05 DIAGNOSIS — Z992 Dependence on renal dialysis: Secondary | ICD-10-CM | POA: Diagnosis not present

## 2023-07-05 DIAGNOSIS — Z79899 Other long term (current) drug therapy: Secondary | ICD-10-CM | POA: Diagnosis not present

## 2023-07-05 NOTE — Telephone Encounter (Signed)
Transition Care Management Follow-up Telephone Call Date of discharge and from where: 06/01/2023 [facility] How have you been since you were released from the hospital? Patient stated he is feeling much better. Any questions or concerns? No  Items Reviewed: Did the pt receive and understand the discharge instructions provided? Yes  Medications obtained and verified? Yes  Other? No  Any new allergies since your discharge? No  Dietary orders reviewed? Yes Do you have support at home? Yes   Follow up appointments reviewed:  PCP Hospital f/u appt confirmed? Yes  Scheduled to see Shirline Frees, NP on 06/04/2023 @ [facility] at [place]. Specialist Hospital f/u appt confirmed? No  Scheduled to see  on  @ . Are transportation arrangements needed? No  If their condition worsens, is the pt aware to call PCP or go to the Emergency Dept.? Yes Was the patient provided with contact information for the PCP's office or ED? Yes Was to pt encouraged to call back with questions or concerns? Yes   Kabe Mckoy [facility]  [facility] [place] [facility] Guide Direct Dial: [phone]  Website: [email or web address]

## 2023-07-08 ENCOUNTER — Encounter: Payer: Self-pay | Admitting: Adult Health

## 2023-07-08 ENCOUNTER — Encounter: Payer: Self-pay | Admitting: Podiatry

## 2023-07-08 ENCOUNTER — Ambulatory Visit: Payer: PPO | Admitting: Podiatry

## 2023-07-08 ENCOUNTER — Ambulatory Visit (INDEPENDENT_AMBULATORY_CARE_PROVIDER_SITE_OTHER): Payer: PPO

## 2023-07-08 DIAGNOSIS — E1140 Type 2 diabetes mellitus with diabetic neuropathy, unspecified: Secondary | ICD-10-CM | POA: Diagnosis not present

## 2023-07-08 DIAGNOSIS — M722 Plantar fascial fibromatosis: Secondary | ICD-10-CM

## 2023-07-08 DIAGNOSIS — L909 Atrophic disorder of skin, unspecified: Secondary | ICD-10-CM | POA: Diagnosis not present

## 2023-07-08 DIAGNOSIS — L84 Corns and callosities: Secondary | ICD-10-CM

## 2023-07-08 DIAGNOSIS — Z794 Long term (current) use of insulin: Secondary | ICD-10-CM | POA: Diagnosis not present

## 2023-07-08 NOTE — Progress Notes (Signed)
  Subjective:  Patient ID: Russell [place], male    DOB: February 24, 1951,   MRN: [id number]  Chief Complaint  Patient presents with   Foot Pain    BILAT HEEL PAIN FOR A COUPLE WKS, WHEN HE WAS HERE A COUPLE WEEKS AGO AND SHE SANDED DOWN THE HEELS AND THAT HELPED FOR 3 DAYS AND THE PAIN CAME BACK     72 y.o. male presents for concern as above. Relates pain when he has his feet elevated.  Patient is diabetic and last A1c was  Lab Results  Component Value Date   HGBA1C 10.2 (A) 05/30/2023   .   PCP:  Shirline Frees, NP    . Denies any other pedal complaints. Denies n/v/f/c.   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   Chronic kidney disease    Diabetes mellitus type II    Diabetic retinopathy FOLLOWED BY DR Luciana Axe   ED (erectile dysfunction)    Glaucoma of both eyes    Hypertension    Left hydrocele    Stroke [facility])     Objective:  Physical Exam: Vascular: DP/PT pulses 1/4 bilateral. CFT <3 seconds. Absent hair growth on digits. Edema noted to bilateral lower extremities. Xerosis noted bilaterally.  Skin. No lacerations or abrasions bilateral feet. Nails 1-5 bilateral  are thickened discolored and elongated with subungual debris. Hyperkeratotic lesion noted plantar left heel and posterior right heel.  Musculoskeletal: MMT 5/5 bilateral lower extremities in DF, PF, Inversion and Eversion. Deceased ROM in DF of ankle joint. Mild HAV deformity bilateral and hammered digits 2-5 bilateral.  Neurological: Sensation intact to light touch. Protective sensation diminished bilateral.    Assessment:   1. Fat pad atrophy of foot   2. Callus   3. Type 2 diabetes mellitus with diabetic neuropathy, with long-term current use of insulin (HCC)       Plan:  Patient was evaluated and treated and all questions answered. X-rays reviewed and discussed with patient. No acute fractures or dislocations noted. Some mild spurr ing  noted to inferior calcaneus bilateral.  -Discussed and educated patient on diabetic foot care, especially with  regards to the vascular, neurological and musculoskeletal systems.  -Stressed the importance of good glycemic control and the detriment of not  controlling glucose levels in relation to the foot. -Discussed supportive shoes at all times and checking feet regularly.  -Discussed fat pad atrophy and using gel heel cup to cushion.  -Also discussed going ahead and checking circulation with ABIs.  -Hyperkeratotc area debrided with chisel without incident as courtesy.  -Answered all patient questions -Patient to return  in 3 months for at risk foot care -Patient advised to call the office if any problems or questions arise in the meantime.   Louann Sjogren, DPM

## 2023-07-09 ENCOUNTER — Other Ambulatory Visit: Payer: Self-pay | Admitting: Adult Health

## 2023-07-09 MED ORDER — FREESTYLE LIBRE 3 PLUS SENSOR MISC: 3 refills | Status: DC

## 2023-07-09 NOTE — Telephone Encounter (Signed)
Ok to send in a regular meter?

## 2023-07-10 ENCOUNTER — Other Ambulatory Visit (INDEPENDENT_AMBULATORY_CARE_PROVIDER_SITE_OTHER): Payer: PPO

## 2023-07-10 DIAGNOSIS — E1140 Type 2 diabetes mellitus with diabetic neuropathy, unspecified: Secondary | ICD-10-CM

## 2023-07-10 NOTE — Progress Notes (Signed)
07/10/2023 Name: Russell [place] MRN: [id number] DOB: 02/08/1951  Chief Complaint  Patient presents with   Diabetes   Medication Management    Russell Austin is a 72 y.o. year old male who presented for a telephone visit.   They were referred to the pharmacist by their PCP for assistance in managing diabetes and complex medication management.    Subjective:  Care Team: Primary Care Provider: Shirline Frees, NP ; Next Scheduled Visit: 08/29/23  Medication Access/Adherence  Current Pharmacy:  [id number] - [place], [place] - [address] [place] [address] Phone: [phone] Fax: [phone]   Patient reports affordability concerns with their medications: No  Patient reports access/transportation concerns to their pharmacy: No  Patient reports adherence concerns with their medications:  No     Diabetes:  Current Medications: Lantus 20 units BID, Novolog 5 units TID with meals, Glipizide 5mg  BID  Has been without sensors due to supply issue, pt's wife plans to pick up today from pharmacy.   Objective:  Lab Results  Component Value Date   HGBA1C 10.2 (A) 05/30/2023    Lab Results  Component Value Date   CREATININE 3.06 (H) 06/01/2023   BUN 61 (H) 06/01/2023   NA 131 (L) 06/01/2023   K 2.8 (L) 06/01/2023   CL 91 (L) 06/01/2023   CO2 31 06/01/2023    Lab Results  Component Value Date   CHOL 90 02/28/2023   HDL 26.30 (L) 02/28/2023   LDLCALC 46 02/28/2023   TRIG 88.0 02/28/2023   CHOLHDL 3 02/28/2023    Medications Reviewed Today     Reviewed by Sherrill Raring, RPH (Pharmacist) on 07/10/23 at 1449  Med List Status: <None>   Medication Order Taking? Sig Documenting Provider Last Dose Status Informant  amLODipine (NORVASC) 5 MG tablet [id number] Yes Take 1 tablet (5 mg total) by mouth daily. Nafziger, Kandee Keen, NP Taking Active   aspirin EC 81 MG tablet [id number] Yes Take 81 mg by mouth daily. [provider] Taking Active Self  atorvastatin (LIPITOR) 10 MG tablet [id number] Yes Take 1 tablet (10 mg total) by mouth every morning. Nafziger, Kandee Keen, NP Taking Active   blood glucose meter kit and supplies KIT [id number] Yes Dispense based on patient and insurance preference. Use up to four times daily as directed. Nafziger, Kandee Keen, NP Taking Active Self  buPROPion (WELLBUTRIN XL) 300 MG 24 hr tablet [id number] Yes Take 1 tablet (300 mg total) by mouth every morning. Nafziger, Kandee Keen, NP Taking Active   calcitRIOL (ROCALTROL) 0.25 MCG capsule [id number] Yes Take 0.25 mcg by mouth daily. [provider] Taking Active Self  chlorproMAZINE (THORAZINE) 25 MG tablet [id number]  Take 1 tablet (25 mg total) by mouth 3 (three) times daily as needed for up to 3 days for hiccoughs.  Patient not taking: Reported on 06/26/2023   Maxwell Marion, PA-C  Expired 06/04/23 2359   chlorthalidone (HYGROTON) 25 MG tablet [id number] No Take 25 mg by mouth daily.  Patient not taking: Reported on 07/10/2023   [provider] Not Taking Active Self  Continuous Blood Gluc Receiver (FREESTYLE LIBRE [address] READER) DEVI [id number]  Use with freestyle libre system Nafziger, Kandee Keen, NP  Active Self  Continuous Glucose Sensor (FREESTYLE LIBRE 3 PLUS SENSOR) MISC [id number]  Change sensor every 15 days. Nafziger, Kandee Keen, NP  Active   Finerenone (KERENDIA) 10 MG TABS [id number] No Take 10 mg by mouth daily.  Patient not taking: Reported on 06/26/2023  [provider] Not Taking Active Self  gabapentin (NEURONTIN) 300 MG capsule [id number] Yes TAKE ONE CAPSULE BY MOUTH EVERYDAY AT BEDTIME Nafziger, Kandee Keen, NP Taking Active   gentamicin cream (GARAMYCIN) 0.1 % [id number]  Apply topically daily. [provider]  Active   glipiZIDE (GLUCOTROL XL) 5 MG 24 hr tablet [id number] Yes Take 1 tablet (5 mg total) by mouth 2 (two) times daily. Nafziger, Kandee Keen, NP Taking Active   insulin aspart (NOVOLOG FLEXPEN) 100 UNIT/ML  FlexPen [id number] Yes Inject 5 Units into the skin 3 (three) times daily with meals. Nafziger, Kandee Keen, NP Taking Active   insulin glargine (LANTUS SOLOSTAR) 100 UNIT/ML Solostar Pen [id number] Yes Inject 20 Units into the skin 2 (two) times daily. Nafziger, Kandee Keen, NP Taking Active   Insulin Pen Needle (B-D ULTRAFINE III SHORT PEN) 31G X 8 MM MISC [id number]  USE WITH LANUTS PEN Nafziger, [place], NP  Active Self  lactulose (CHRONULAC) 10 GM/15ML solution [id number] Yes Take by mouth. [provider] Taking Active   Lancets Letta Pate [place] PLUS [place]) MISC [id number]  Apply topically. [provider]  Active Self  losartan (COZAAR) 100 MG tablet [id number] Yes Take 1 tablet (100 mg total) by mouth every morning. Nafziger, Kandee Keen, NP Taking Active   metoprolol tartrate (LOPRESSOR) 50 MG tablet [id number] Yes Take 1 tablet (50 mg total) by mouth 2 (two) times daily. Nafziger, Kandee Keen, NP Taking Active   ONETOUCH ULTRA test strip [id number]  USE TO check blood glucose UP TO four times daily AS DIRECTED [provider]  Active Self  oxyCODONE-acetaminophen (PERCOCET) 5-325 MG tablet [id number] No Take 1 tablet by mouth every 6 (six) hours as needed for severe pain.  Patient not taking: Reported on 07/10/2023   Graceann Congress, PA-C Not Taking Active   tiZANidine (ZANAFLEX) 2 MG tablet [id number] Yes TAKE ONE TABLET BY MOUTH EVERYDAY AT BEDTIME Nafziger, Kandee Keen, NP Taking Active   torsemide (DEMADEX) 20 MG tablet [id number] Yes Take 20 mg by mouth 2 (two) times daily. [provider] Taking Active Self              Assessment/Plan:   Diabetes: - Currently uncontrolled - Reviewed long term cardiovascular and renal outcomes of uncontrolled blood sugar - Reviewed goal A1c, goal fasting, and goal 2 hour post prandial glucose -Recommend to continue current medication regimen - Recommend to pick up sensors today and reapply so we can adequately assess sugar control with new insulin  regimen     Follow Up Plan: 07/12/23 to re-establish libreview connection and assess sugar control  Sherrill Raring, PharmD Clinical Pharmacist [phone]

## 2023-07-11 ENCOUNTER — Ambulatory Visit (HOSPITAL_COMMUNITY): Admission: RE | Admit: 2023-07-11 | Payer: PPO | Source: Ambulatory Visit | Attending: Podiatry | Admitting: Podiatry

## 2023-07-12 ENCOUNTER — Other Ambulatory Visit: Payer: PPO

## 2023-07-15 ENCOUNTER — Other Ambulatory Visit: Payer: PPO

## 2023-07-19 ENCOUNTER — Other Ambulatory Visit (HOSPITAL_COMMUNITY): Payer: Self-pay | Admitting: Neurosurgery

## 2023-07-19 DIAGNOSIS — M5416 Radiculopathy, lumbar region: Secondary | ICD-10-CM | POA: Diagnosis not present

## 2023-07-19 DIAGNOSIS — M47816 Spondylosis without myelopathy or radiculopathy, lumbar region: Secondary | ICD-10-CM | POA: Diagnosis not present

## 2023-07-19 DIAGNOSIS — M48062 Spinal stenosis, lumbar region with neurogenic claudication: Secondary | ICD-10-CM

## 2023-07-23 DIAGNOSIS — E113592 Type 2 diabetes mellitus with proliferative diabetic retinopathy without macular edema, left eye: Secondary | ICD-10-CM | POA: Diagnosis not present

## 2023-07-23 DIAGNOSIS — Z970 Presence of artificial eye: Secondary | ICD-10-CM | POA: Diagnosis not present

## 2023-07-23 DIAGNOSIS — H43822 Vitreomacular adhesion, left eye: Secondary | ICD-10-CM | POA: Diagnosis not present

## 2023-07-24 ENCOUNTER — Other Ambulatory Visit: Payer: PPO

## 2023-07-24 DIAGNOSIS — E1140 Type 2 diabetes mellitus with diabetic neuropathy, unspecified: Secondary | ICD-10-CM

## 2023-07-24 NOTE — Progress Notes (Signed)
07/24/2023 Name: Russell [place] MRN: [id number] DOB: [birth year]  Chief Complaint  Patient presents with   Diabetes    Russell [place] is a 72 y.o. year old male who presented for a telephone visit.   They were referred to the pharmacist by their PCP for assistance in managing diabetes and complex medication management.    Subjective:  Care Team: Primary Care Provider: Shirline Frees, NP ; Next Scheduled Visit: 08/29/23  Medication Access/Adherence  Current Pharmacy:  [id number] - [place], [place] - [address] [place] [address] Phone: [phone] Fax: [phone]   Patient reports affordability concerns with their medications: No  Patient reports access/transportation concerns to their pharmacy: No  Patient reports adherence concerns with their medications:  No     Diabetes:  Current Medications: Lantus 20 units BID, Novolog 5 units TID with meals, Glipizide 5mg  BID - NOT FOLLOWING, HAS BEEN DOING Lantus 20 units once daily, Novolog 5 units once daily with a meal, patient states he did recall this dose increase (was informed on 10/23 pcp visit)  Picked up new sensors on 07/10/23, Summary Below:    Objective:  Lab Results  Component Value Date   HGBA1C 10.2 (A) 05/30/2023    Lab Results  Component Value Date   CREATININE 3.06 (H) 06/01/2023   BUN 61 (H) 06/01/2023   NA 131 (L) 06/01/2023   K 2.8 (L) 06/01/2023   CL 91 (L) 06/01/2023   CO2 31 06/01/2023    Lab Results  Component Value Date   CHOL 90 02/28/2023   HDL 26.30 (L) 02/28/2023   LDLCALC 46 02/28/2023   TRIG 88.0 02/28/2023   CHOLHDL 3 02/28/2023    Medications Reviewed Today     Reviewed by Sherrill Raring, RPH (Pharmacist) on 07/24/23 at 1519  Med List Status: <None>   Medication Order Taking? Sig Documenting Provider Last Dose Status Informant  amLODipine (NORVASC) 5 MG tablet [id number] No Take 1 tablet (5 mg total) by mouth daily.  Nafziger, Kandee Keen, NP Taking Active   aspirin EC 81 MG tablet [id number] No Take 81 mg by mouth daily. [provider] Taking Active Self  atorvastatin (LIPITOR) 10 MG tablet [id number] No Take 1 tablet (10 mg total) by mouth every morning. Nafziger, Kandee Keen, NP Taking Active   blood glucose meter kit and supplies KIT [id number] No Dispense based on patient and insurance preference. Use up to four times daily as directed. Nafziger, Kandee Keen, NP Taking Active Self  buPROPion (WELLBUTRIN XL) 300 MG 24 hr tablet [id number] No Take 1 tablet (300 mg total) by mouth every morning. Nafziger, Kandee Keen, NP Taking Active   calcitRIOL (ROCALTROL) 0.25 MCG capsule [id number] No Take 0.25 mcg by mouth daily. [provider] Taking Active Self  chlorproMAZINE (THORAZINE) 25 MG tablet [id number] No Take 1 tablet (25 mg total) by mouth 3 (three) times daily as needed for up to 3 days for hiccoughs.  Patient not taking: Reported on 06/26/2023   Russell Marion, PA-C Not Taking Expired 06/04/23 2359   chlorthalidone (HYGROTON) 25 MG tablet [id number] No Take 25 mg by mouth daily.  Patient not taking: Reported on 07/10/2023   [provider] Not Taking Active Self  Continuous Blood Gluc Receiver (FREESTYLE LIBRE 14 DAY READER) DEVI [id number] No Use with freestyle libre system Nafziger, [place], NP Taking Active Self  Continuous Glucose Sensor (FREESTYLE LIBRE 3 PLUS SENSOR) MISC [id number]  Change sensor every 15 days. Shirline Frees, NP  Active  Finerenone (KERENDIA) 10 MG TABS [id number] No Take 10 mg by mouth daily.  Patient not taking: Reported on 06/26/2023   [provider] Not Taking Active Self  gabapentin (NEURONTIN) 300 MG capsule [id number] No TAKE ONE CAPSULE BY MOUTH EVERYDAY AT BEDTIME Nafziger, Kandee Keen, NP Taking Active   gentamicin cream (GARAMYCIN) 0.1 % [id number] No Apply topically daily. [provider] Taking Active   glipiZIDE (GLUCOTROL XL) 5 MG 24 hr tablet [id number] No Take 1  tablet (5 mg total) by mouth 2 (two) times daily. Nafziger, Kandee Keen, NP Taking Active   insulin aspart (NOVOLOG FLEXPEN) 100 UNIT/ML FlexPen [id number] No Inject 5 Units into the skin 3 (three) times daily with meals. Nafziger, Kandee Keen, NP Taking Active   insulin glargine (LANTUS SOLOSTAR) 100 UNIT/ML Solostar Pen [id number] No Inject 20 Units into the skin 2 (two) times daily. Nafziger, Kandee Keen, NP Taking Active   Insulin Pen Needle (B-D ULTRAFINE III SHORT PEN) 31G X 8 MM MISC [id number] No USE WITH LANUTS PEN Nafziger, Cory, NP Taking Active Self  lactulose (CHRONULAC) 10 GM/15ML solution [id number] No Take by mouth. [provider] Taking Active   Lancets Letta Pate [place] PLUS [place]) MISC [id number] No Apply topically. [provider] Taking Active Self  losartan (COZAAR) 100 MG tablet [id number] No Take 1 tablet (100 mg total) by mouth every morning. Nafziger, Kandee Keen, NP Taking Active   metoprolol tartrate (LOPRESSOR) 50 MG tablet [id number] No Take 1 tablet (50 mg total) by mouth 2 (two) times daily. Nafziger, Kandee Keen, NP Taking Active   ONETOUCH ULTRA test strip [id number] No USE TO check blood glucose UP TO four times daily AS DIRECTED [provider] Taking Active Self  oxyCODONE-acetaminophen (PERCOCET) 5-325 MG tablet [id number] No Take 1 tablet by mouth every 6 (six) hours as needed for severe pain.  Patient not taking: Reported on 07/10/2023   Russell Congress, PA-C Not Taking Active   tiZANidine (ZANAFLEX) 2 MG tablet [id number] No TAKE ONE TABLET BY MOUTH EVERYDAY AT BEDTIME Nafziger, Kandee Keen, NP Taking Active   torsemide (DEMADEX) 20 MG tablet [id number] No Take 20 mg by mouth 2 (two) times daily. [provider] Taking Active Self              Assessment/Plan:   Diabetes: - Currently uncontrolled - Reviewed long term cardiovascular and renal outcomes of uncontrolled blood sugar - Reviewed goal A1c, goal fasting, and goal 2 hour post prandial glucose -INCREASE  Lantus to 20 units BID as previously instructed. Patient will start today. Also recommended increase in Novolog to 5 units TID with meals, patient declines for now, wants to just increase lantus today.     Follow Up Plan: 1 week  Sherrill Raring, PharmD Clinical Pharmacist [phone]

## 2023-07-25 ENCOUNTER — Ambulatory Visit (HOSPITAL_COMMUNITY)
Admission: RE | Admit: 2023-07-25 | Discharge: 2023-07-25 | Disposition: A | Discharge status: Home / Self Care | Payer: PPO | Source: Ambulatory Visit | Attending: Neurosurgery | Admitting: Neurosurgery

## 2023-07-25 ENCOUNTER — Ambulatory Visit (HOSPITAL_COMMUNITY)
Admission: RE | Admit: 2023-07-25 | Discharge: 2023-07-25 | Disposition: A | Discharge status: Home / Self Care | Payer: PPO | Source: Ambulatory Visit | Attending: Internal Medicine | Admitting: Internal Medicine

## 2023-07-25 DIAGNOSIS — L84 Corns and callosities: Secondary | ICD-10-CM | POA: Diagnosis not present

## 2023-07-25 DIAGNOSIS — M48062 Spinal stenosis, lumbar region with neurogenic claudication: Secondary | ICD-10-CM

## 2023-07-25 DIAGNOSIS — R531 Weakness: Secondary | ICD-10-CM | POA: Diagnosis not present

## 2023-07-25 DIAGNOSIS — L909 Atrophic disorder of skin, unspecified: Secondary | ICD-10-CM | POA: Insufficient documentation

## 2023-07-25 DIAGNOSIS — M5136 Other intervertebral disc degeneration, lumbar region with discogenic back pain only: Secondary | ICD-10-CM | POA: Diagnosis not present

## 2023-07-25 DIAGNOSIS — E1140 Type 2 diabetes mellitus with diabetic neuropathy, unspecified: Secondary | ICD-10-CM | POA: Insufficient documentation

## 2023-07-25 DIAGNOSIS — Z794 Long term (current) use of insulin: Secondary | ICD-10-CM | POA: Insufficient documentation

## 2023-07-25 DIAGNOSIS — M48061 Spinal stenosis, lumbar region without neurogenic claudication: Secondary | ICD-10-CM | POA: Diagnosis not present

## 2023-07-25 DIAGNOSIS — M4807 Spinal stenosis, lumbosacral region: Secondary | ICD-10-CM | POA: Diagnosis not present

## 2023-07-30 ENCOUNTER — Other Ambulatory Visit: Payer: PPO

## 2023-07-30 NOTE — Progress Notes (Signed)
07/30/2023 Name: [place] MRN: [id number] DOB: 1951-06-15  Chief Complaint  Patient presents with   Diabetes   Medication Management    RAFAT POAGUE is a 72 y.o. year old male who presented for a telephone visit.   They were referred to the pharmacist by their PCP for assistance in managing diabetes and complex medication management.    Subjective:  Care Team: Primary Care Provider: Shirline Frees, NP ; Next Scheduled Visit: 08/29/23  Medication Access/Adherence  Current Pharmacy:  [id number] - [place], [place] - [address]  [address] Phone: [phone] Fax: [phone]   Patient reports affordability concerns with their medications: No  Patient reports access/transportation concerns to their pharmacy: No  Patient reports adherence concerns with their medications:  No     Diabetes:  Current Medications: Lantus 20 units BID, Novolog 5 units with dinner, Glipizide 5mg  BID  Picked up new sensors on 07/10/23, Summary Below:    Objective:  Lab Results  Component Value Date   HGBA1C 10.2 (A) 05/30/2023    Lab Results  Component Value Date   CREATININE 3.06 (H) 06/01/2023   BUN 61 (H) 06/01/2023   NA 131 (L) 06/01/2023   K 2.8 (L) 06/01/2023   CL 91 (L) 06/01/2023   CO2 31 06/01/2023    Lab Results  Component Value Date   CHOL 90 02/28/2023   HDL 26.30 (L) 02/28/2023   LDLCALC 46 02/28/2023   TRIG 88.0 02/28/2023   CHOLHDL 3 02/28/2023    Medications Reviewed Today     Reviewed by Sherrill Raring, RPH (Pharmacist) on 07/30/23 at 1026  Med List Status: <None>   Medication Order Taking? Sig Documenting Provider Last Dose Status Informant  amLODipine (NORVASC) 5 MG tablet [id number] No Take 1 tablet (5 mg total) by mouth daily. Nafziger, Kandee Keen, NP Taking Active   aspirin EC 81 MG tablet [id number] No Take 81 mg by mouth daily. [provider] Taking Active Self  atorvastatin  (LIPITOR) 10 MG tablet [id number] No Take 1 tablet (10 mg total) by mouth every morning. Nafziger, Kandee Keen, NP Taking Active   blood glucose meter kit and supplies KIT [id number] No Dispense based on patient and insurance preference. Use up to four times daily as directed. Nafziger, Kandee Keen, NP Taking Active Self  buPROPion (WELLBUTRIN XL) 300 MG 24 hr tablet [id number] No Take 1 tablet (300 mg total) by mouth every morning. Nafziger, Kandee Keen, NP Taking Active   calcitRIOL (ROCALTROL) 0.25 MCG capsule [id number] No Take 0.25 mcg by mouth daily. [provider] Taking Active Self  chlorproMAZINE (THORAZINE) 25 MG tablet [id number] No Take 1 tablet (25 mg total) by mouth 3 (three) times daily as needed for up to 3 days for hiccoughs.  Patient not taking: Reported on 06/26/2023   Maxwell Marion, PA-C Not Taking Expired 06/04/23 2359   chlorthalidone (HYGROTON) 25 MG tablet [id number] No Take 25 mg by mouth daily.  Patient not taking: Reported on 07/10/2023   [provider] Not Taking Active Self  Continuous Blood Gluc Receiver (FREESTYLE LIBRE 14 DAY READER) DEVI [id number] No Use with freestyle libre system Nafziger, [place], NP Taking Active Self  Continuous Glucose Sensor (FREESTYLE LIBRE 3 PLUS SENSOR) MISC [id number]  Change sensor every 15 days. Nafziger, Kandee Keen, NP  Active   Finerenone (KERENDIA) 10 MG TABS [id number] No Take 10 mg by mouth daily.  Patient not taking: Reported on 06/26/2023   [provider] Not Taking Active Self  gabapentin (NEURONTIN) 300 MG capsule [id number] No TAKE ONE CAPSULE BY MOUTH EVERYDAY AT BEDTIME Nafziger, Kandee Keen, NP Taking Active   gentamicin cream (GARAMYCIN) 0.1 % [id number] No Apply topically daily. [provider] Taking Active   glipiZIDE (GLUCOTROL XL) 5 MG 24 hr tablet [id number] No Take 1 tablet (5 mg total) by mouth 2 (two) times daily. Nafziger, Kandee Keen, NP Taking Active   insulin aspart (NOVOLOG FLEXPEN) 100 UNIT/ML FlexPen [id number] No Inject 5  Units into the skin 3 (three) times daily with meals. Nafziger, Kandee Keen, NP Taking Active   insulin glargine (LANTUS SOLOSTAR) 100 UNIT/ML Solostar Pen [id number] No Inject 20 Units into the skin 2 (two) times daily. Nafziger, Kandee Keen, NP Taking Active   Insulin Pen Needle (B-D ULTRAFINE III SHORT PEN) 31G X 8 MM MISC [id number] No USE WITH LANUTS PEN Nafziger, [place], NP Taking Active Self  lactulose (CHRONULAC) 10 GM/15ML solution [id number] No Take by mouth. [provider] Taking Active   Lancets Letta Pate [place] PLUS [place]) MISC [id number] No Apply topically. [provider] Taking Active Self  losartan (COZAAR) 100 MG tablet [id number] No Take 1 tablet (100 mg total) by mouth every morning. Nafziger, Kandee Keen, NP Taking Active   metoprolol tartrate (LOPRESSOR) 50 MG tablet [id number] No Take 1 tablet (50 mg total) by mouth 2 (two) times daily. Nafziger, Kandee Keen, NP Taking Active   ONETOUCH ULTRA test strip [id number] No USE TO check blood glucose UP TO four times daily AS DIRECTED [provider] Taking Active Self  oxyCODONE-acetaminophen (PERCOCET) 5-325 MG tablet [id number] No Take 1 tablet by mouth every 6 (six) hours as needed for severe pain.  Patient not taking: Reported on 07/10/2023   Graceann Congress, PA-C Not Taking Active   tiZANidine (ZANAFLEX) 2 MG tablet [id number] No TAKE ONE TABLET BY MOUTH EVERYDAY AT BEDTIME Nafziger, Kandee Keen, NP Taking Active   torsemide (DEMADEX) 20 MG tablet [id number] No Take 20 mg by mouth 2 (two) times daily. [provider] Taking Active Self              Assessment/Plan:   Diabetes: - Currently uncontrolled - Reviewed long term cardiovascular and renal outcomes of uncontrolled blood sugar - Reviewed goal A1c, goal fasting, and goal 2 hour post prandial glucose -INCREASE Lantus to 25 units BID, Novolog 8 units with dinner. Continue glipizide. Patient refuses to inject Novolog more than once daily. Consider addition of ozempic if  sugars still elevated at with this regimen.     Follow Up Plan: 1 week  Sherrill Raring, PharmD Clinical Pharmacist [phone]

## 2023-08-03 DIAGNOSIS — N186 End stage renal disease: Secondary | ICD-10-CM | POA: Diagnosis not present

## 2023-08-03 DIAGNOSIS — Z992 Dependence on renal dialysis: Secondary | ICD-10-CM | POA: Diagnosis not present

## 2023-08-03 DIAGNOSIS — E1122 Type 2 diabetes mellitus with diabetic chronic kidney disease: Secondary | ICD-10-CM | POA: Diagnosis not present

## 2023-08-04 DIAGNOSIS — K769 Liver disease, unspecified: Secondary | ICD-10-CM | POA: Diagnosis not present

## 2023-08-04 DIAGNOSIS — D509 Iron deficiency anemia, unspecified: Secondary | ICD-10-CM | POA: Diagnosis not present

## 2023-08-04 DIAGNOSIS — R82998 Other abnormal findings in urine: Secondary | ICD-10-CM | POA: Diagnosis not present

## 2023-08-04 DIAGNOSIS — Z79899 Other long term (current) drug therapy: Secondary | ICD-10-CM | POA: Diagnosis not present

## 2023-08-04 DIAGNOSIS — N186 End stage renal disease: Secondary | ICD-10-CM | POA: Diagnosis not present

## 2023-08-04 DIAGNOSIS — N2589 Other disorders resulting from impaired renal tubular function: Secondary | ICD-10-CM | POA: Diagnosis not present

## 2023-08-04 DIAGNOSIS — D631 Anemia in chronic kidney disease: Secondary | ICD-10-CM | POA: Diagnosis not present

## 2023-08-04 DIAGNOSIS — Z4932 Encounter for adequacy testing for peritoneal dialysis: Secondary | ICD-10-CM | POA: Diagnosis not present

## 2023-08-04 DIAGNOSIS — Z992 Dependence on renal dialysis: Secondary | ICD-10-CM | POA: Diagnosis not present

## 2023-08-04 DIAGNOSIS — E440 Moderate protein-calorie malnutrition: Secondary | ICD-10-CM | POA: Diagnosis not present

## 2023-08-04 DIAGNOSIS — N2581 Secondary hyperparathyroidism of renal origin: Secondary | ICD-10-CM | POA: Diagnosis not present

## 2023-08-06 ENCOUNTER — Other Ambulatory Visit: Payer: PPO

## 2023-08-06 NOTE — Progress Notes (Signed)
   08/06/2023 Name: Russell [place] MRN: [id number] DOB: [birth year]  No chief complaint on file.   Russell [place] is a 72 y.o. year old male who presented for a telephone visit.   They were referred to the pharmacist by their PCP for assistance in managing diabetes and complex medication management.    Subjective:  Care Team: Primary Care Provider: Shirline Frees, NP ; Next Scheduled Visit: 08/29/23  Medication Access/Adherence  Current Pharmacy:  [id number] - [place], [place] - [address] [place] [address] Phone: [phone] Fax: [phone]   Patient reports affordability concerns with their medications: No  Patient reports access/transportation concerns to their pharmacy: No  Patient reports adherence concerns with their medications:  No     Diabetes:  Current Medications: Lantus 20 units BID, Novolog 5 units with dinner, Glipizide 5mg  BID    A1C improved to 9.5 but still markedly elevated. Two low sugar events but patient states he likely went too long without eating and they corrected with a meal.  Did NOT increase to lantus 25 units BID and Novolog 8 units as instructed last week. Fearful of lows.  Objective:  Lab Results  Component Value Date   HGBA1C 10.2 (A) 05/30/2023    Lab Results  Component Value Date   CREATININE 3.06 (H) 06/01/2023   BUN 61 (H) 06/01/2023   NA 131 (L) 06/01/2023   K 2.8 (L) 06/01/2023   CL 91 (L) 06/01/2023   CO2 31 06/01/2023    Lab Results  Component Value Date   CHOL 90 02/28/2023   HDL 26.30 (L) 02/28/2023   LDLCALC 46 02/28/2023   TRIG 88.0 02/28/2023   CHOLHDL 3 02/28/2023    Medications Reviewed Today   Medications were not reviewed in this encounter       Assessment/Plan:   Diabetes: - Currently uncontrolled - Reviewed long term cardiovascular and renal outcomes of uncontrolled blood sugar - Reviewed goal A1c, goal fasting, and goal 2 hour post prandial  glucose -INCREASE Lantus to 25 units qam, 20 units pam, Novolog 8 units with dinner. Continue glipizide. Patient refuses to inject Novolog more than once daily. Declines to start any new med such as ozempic at this time    Follow Up Plan: 1 week  Sherrill Raring, PharmD Clinical Pharmacist [phone]

## 2023-08-12 ENCOUNTER — Telehealth: Payer: Self-pay | Admitting: Adult Health

## 2023-08-12 NOTE — Telephone Encounter (Addendum)
Spouse called to say Pt has been vomiting for over a week. She added that Pt has missed several dialysis appts. Offered to be transferred to Triage nurse for advice. [place] that they want an appt with NP.  Aware NP is OOO on Mondays. Office Manager advised this message be sent back to NP.  Please call Pt to discuss.

## 2023-08-13 ENCOUNTER — Other Ambulatory Visit (INDEPENDENT_AMBULATORY_CARE_PROVIDER_SITE_OTHER): Payer: PPO

## 2023-08-13 DIAGNOSIS — E1140 Type 2 diabetes mellitus with diabetic neuropathy, unspecified: Secondary | ICD-10-CM

## 2023-08-13 NOTE — Telephone Encounter (Signed)
Please advise we have virtual today and appts. Tomorrow if appropriate to wait.

## 2023-08-13 NOTE — Progress Notes (Signed)
08/13/2023 Name: Russell Austin MRN: [id number] DOB: 1951/06/30  Chief Complaint  Patient presents with   Diabetes    Russell Austin is a 72 y.o. year old male who presented for a telephone visit.   They were referred to the pharmacist by their PCP for assistance in managing diabetes and complex medication management.    Subjective:  Care Team: Primary Care Provider: Shirline Frees, NP ; Next Scheduled Visit: 08/29/23  Medication Access/Adherence  Current Pharmacy:  [id number] - [place], [place] - [address] [place] [address] Phone: [phone] Fax: [phone]   Patient reports affordability concerns with their medications: No  Patient reports access/transportation concerns to their pharmacy: No  Patient reports adherence concerns with their medications:  No     Diabetes:  Current Medications: Lantus 25 units BID, Novolog 8 units with dinner, Glipizide 5mg  BID      Increased to lantus 25 units BID and Novolog 8 units as instructed last week. Resulted in mostly controlled sugars. Patient reports he did get very sick for the last 2 to 3 days and held all of his insulin and sugar meds, which results in very high sugar readings. Patient states he is feeling better today and will be resuming his regimen.  Objective:  Lab Results  Component Value Date   HGBA1C 10.2 (A) 05/30/2023    Lab Results  Component Value Date   CREATININE 3.06 (H) 06/01/2023   BUN 61 (H) 06/01/2023   NA 131 (L) 06/01/2023   K 2.8 (L) 06/01/2023   CL 91 (L) 06/01/2023   CO2 31 06/01/2023    Lab Results  Component Value Date   CHOL 90 02/28/2023   HDL 26.30 (L) 02/28/2023   LDLCALC 46 02/28/2023   TRIG 88.0 02/28/2023   CHOLHDL 3 02/28/2023    Medications Reviewed Today     Reviewed by Sherrill Raring, RPH (Pharmacist) on 08/13/23 at [phone]  Med List Status: <None>   Medication Order Taking? Sig Documenting Provider Last Dose  Status Informant  amLODipine (NORVASC) 5 MG tablet [id number] No Take 1 tablet (5 mg total) by mouth daily. Nafziger, Kandee Keen, NP Taking Active   aspirin EC 81 MG tablet [id number] No Take 81 mg by mouth daily. [provider] Taking Active Self  atorvastatin (LIPITOR) 10 MG tablet [id number] No Take 1 tablet (10 mg total) by mouth every morning. Nafziger, Kandee Keen, NP Taking Active   blood glucose meter kit and supplies KIT [id number] No Dispense based on patient and insurance preference. Use up to four times daily as directed. Nafziger, Kandee Keen, NP Taking Active Self  buPROPion (WELLBUTRIN XL) 300 MG 24 hr tablet [id number] No Take 1 tablet (300 mg total) by mouth every morning. Nafziger, Kandee Keen, NP Taking Active   calcitRIOL (ROCALTROL) 0.25 MCG capsule [id number] No Take 0.25 mcg by mouth daily. [provider] Taking Active Self  chlorproMAZINE (THORAZINE) 25 MG tablet [id number] No Take 1 tablet (25 mg total) by mouth 3 (three) times daily as needed for up to 3 days for hiccoughs.  Patient not taking: Reported on 06/26/2023   Maxwell Marion, PA-C Not Taking Expired 06/04/23 2359   chlorthalidone (HYGROTON) 25 MG tablet [id number] No Take 25 mg by mouth daily.  Patient not taking: Reported on 07/10/2023   [provider] Not Taking Active Self  Continuous Blood Gluc Receiver (FREESTYLE LIBRE 14 DAY READER) DEVI [id number] No Use with freestyle libre system Nafziger, Kandee Keen, NP Taking Active Self  Continuous  Glucose Sensor (FREESTYLE LIBRE 3 PLUS SENSOR) MISC [id number]  Change sensor every 15 days. Nafziger, Kandee Keen, NP  Active   Finerenone (KERENDIA) 10 MG TABS [id number] No Take 10 mg by mouth daily.  Patient not taking: Reported on 06/26/2023   [provider] Not Taking Active Self  gabapentin (NEURONTIN) 300 MG capsule [id number] No TAKE ONE CAPSULE BY MOUTH EVERYDAY AT BEDTIME Nafziger, Kandee Keen, NP Taking Active   gentamicin cream (GARAMYCIN) 0.1 % [id number] No Apply topically daily.  [provider] Taking Active   glipiZIDE (GLUCOTROL XL) 5 MG 24 hr tablet [id number] No Take 1 tablet (5 mg total) by mouth 2 (two) times daily. Nafziger, Kandee Keen, NP Taking Active   insulin aspart (NOVOLOG FLEXPEN) 100 UNIT/ML FlexPen [id number] No Inject 5 Units into the skin 3 (three) times daily with meals. Nafziger, Kandee Keen, NP Taking Active   insulin glargine (LANTUS SOLOSTAR) 100 UNIT/ML Solostar Pen [id number] No Inject 20 Units into the skin 2 (two) times daily. Nafziger, Kandee Keen, NP Taking Active   Insulin Pen Needle (B-D ULTRAFINE III SHORT PEN) 31G X 8 MM MISC [id number] No USE WITH LANUTS PEN Nafziger, Cory, NP Taking Active Self  lactulose (CHRONULAC) 10 GM/15ML solution [id number] No Take by mouth. [provider] Taking Active   Lancets Letta Pate Freedom PLUS [place]) MISC [id number] No Apply topically. [provider] Taking Active Self  losartan (COZAAR) 100 MG tablet [id number] No Take 1 tablet (100 mg total) by mouth every morning. Nafziger, Kandee Keen, NP Taking Active   metoprolol tartrate (LOPRESSOR) 50 MG tablet [id number] No Take 1 tablet (50 mg total) by mouth 2 (two) times daily. Nafziger, Kandee Keen, NP Taking Active   ONETOUCH ULTRA test strip [id number] No USE TO check blood glucose UP TO four times daily AS DIRECTED [provider] Taking Active Self  oxyCODONE-acetaminophen (PERCOCET) 5-325 MG tablet [id number] No Take 1 tablet by mouth every 6 (six) hours as needed for severe pain.  Patient not taking: Reported on 07/10/2023   Graceann Congress, PA-C Not Taking Active   tiZANidine (ZANAFLEX) 2 MG tablet [id number] No TAKE ONE TABLET BY MOUTH EVERYDAY AT BEDTIME Nafziger, Kandee Keen, NP Taking Active   torsemide (DEMADEX) 20 MG tablet [id number] No Take 20 mg by mouth 2 (two) times daily. [provider] Taking Active Self              Assessment/Plan:   Diabetes: - Currently uncontrolled - Reviewed long term cardiovascular and renal outcomes of  uncontrolled blood sugar - Reviewed goal A1c, goal fasting, and goal 2 hour post prandial glucose - Resume Lantus to 25 units qam, 25 units Novolog 8 units with dinner. Continue glipizide. Patient refuses to inject Novolog more than once daily. Declines to start any new med such as ozempic at this time    Follow Up Plan: 1 month, reminded of PCP appt on 08/29/23  Sherrill Raring, PharmD Clinical Pharmacist [phone]

## 2023-08-14 ENCOUNTER — Other Ambulatory Visit: Payer: Self-pay | Admitting: Adult Health

## 2023-08-14 MED ORDER — CHLORPROMAZINE HCL 25 MG PO TABS: 25.0000 mg | ORAL_TABLET | Freq: Three times a day (TID) | ORAL | 0 refills | Status: DC | PRN

## 2023-08-14 NOTE — Telephone Encounter (Signed)
Hi, I do not see that this message was routed to NP.  Have you gotten any feedback from NP? Please advise.

## 2023-08-14 NOTE — Telephone Encounter (Signed)
Pt stated that he is feeling better. Pt is not vomiting anymore and went to Dialysis this morning. Pt stated he does not need to go to the ED. Pt also wants Kandee Keen to give him a phone call so he can discuss his health.

## 2023-08-14 NOTE — Telephone Encounter (Signed)
Thank you for catching that! It does look like I didn't route to [facility]. Will route and call pt for update.

## 2023-08-21 ENCOUNTER — Other Ambulatory Visit: Payer: Self-pay | Admitting: Adult Health

## 2023-08-21 DIAGNOSIS — E1140 Type 2 diabetes mellitus with diabetic neuropathy, unspecified: Secondary | ICD-10-CM

## 2023-08-21 MED ORDER — BD PEN NEEDLE SHORT U/F 31G X 8 MM MISC: 3 refills | Status: DC

## 2023-08-21 NOTE — Telephone Encounter (Signed)
Copied from CRM [id number]. Topic: Clinical - Medication Refill >> Aug 21, 2023 12:43 PM Denese Killings wrote: Most Recent Primary Care Visit:  Provider: Delano Metz H  Department: [facility]  Visit Type: PATIENT OUTREACH 30  Date: 08/13/2023  Medication: Insulin Pen Needle (B-D ULTRAFINE III SHORT PEN) 31G X 8 MM MISC  Has the patient contacted their pharmacy? Yes Pharmacy couldn't find prescription number  (Agent: If no, request that the patient contact the pharmacy for the refill. If patient does not wish to contact the pharmacy document the reason why and proceed with request.) (Agent: If yes, when and what did the pharmacy advise?)  Is this the correct pharmacy for this prescription? Yes If no, delete pharmacy and type the correct one.  This is the patient's preferred pharmacy:  [facility] [address] [place] - [address] [place] [address] Phone: [phone] Fax: [phone]   Has the prescription been filled recently? No  Is the patient out of the medication? No Patient has a few more and then he will be out   Has the patient been seen for an appointment in the last year OR does the patient have an upcoming appointment?   Can we respond through MyChart?   Agent: Please be advised that Rx refills may take up to 3 business days. We ask that you follow-up with your pharmacy.

## 2023-08-29 ENCOUNTER — Ambulatory Visit (INDEPENDENT_AMBULATORY_CARE_PROVIDER_SITE_OTHER): Payer: PPO | Admitting: Adult Health

## 2023-08-29 ENCOUNTER — Encounter: Payer: Self-pay | Admitting: Adult Health

## 2023-08-29 VITALS — BP 120/64 | Temp 97.9°F | Ht 69.0 in | Wt 175.0 lb

## 2023-08-29 DIAGNOSIS — Z992 Dependence on renal dialysis: Secondary | ICD-10-CM | POA: Diagnosis not present

## 2023-08-29 DIAGNOSIS — E1140 Type 2 diabetes mellitus with diabetic neuropathy, unspecified: Secondary | ICD-10-CM

## 2023-08-29 DIAGNOSIS — N186 End stage renal disease: Secondary | ICD-10-CM | POA: Diagnosis not present

## 2023-08-29 DIAGNOSIS — I120 Hypertensive chronic kidney disease with stage 5 chronic kidney disease or end stage renal disease: Secondary | ICD-10-CM

## 2023-08-29 DIAGNOSIS — Z794 Long term (current) use of insulin: Secondary | ICD-10-CM

## 2023-08-29 LAB — POCT GLYCOSYLATED HEMOGLOBIN (HGB A1C): Hemoglobin A1C: 9.5 % — AB (ref 4.0–5.6)

## 2023-08-29 NOTE — Progress Notes (Signed)
Subjective:    Patient ID: Russell Austin, male    DOB: [birth year], 72 y.o.   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  DM II -he is currently managed with Lantus 25 units twice daily, Novolog 8 units with dinner ( he refuses to inject Novolog more than once )and glipizide 5 mg BID. He has also not been taking his medication at night.   He has not been checking his blood sugars on a routine basis.   Does lead a pretty sedentary lifestyle.  Lab Results  Component Value Date   HGBA1C 9.5 (A) 08/29/2023   HGBA1C 10.2 (A) 05/30/2023   HGBA1C 8.1 (H) 02/28/2023   CONTINUOUS GLUCOSE MONITORING RECORD INTERPRETATION                 Dates of Recording:    Sensor description: freestyle libre    Results statistics:   CGM use % of time   Average and SD 292  Time in range 7%  % Time Above 180 19%  % Time above 250 74%  % Time Below target 0%     Hypertension-managed with Norvasc 5 mg daily, losartan 100 mg daily, chlorthalidone 12.5 mg daily, Torsemide 20 mg BID, and metoprolol 50 mg twice daily.  He denies dizziness, lightheadedness, chest pain, shortness of breath, or syncopal episodes. 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   Chronic kidney disease    Diabetes mellitus type II    Diabetic retinopathy FOLLOWED BY DR Luciana Axe   ED (erectile dysfunction)    Glaucoma of both eyes    Hypertension    Left  hydrocele    Stroke [facility])     Social History   Socioeconomic History   Marital status: Married    Spouse name: Not on file   Number of children: 1   Years of education: 12   Highest education level: 12th grade  Occupational History   Occupation: Disabled  Tobacco Use   Smoking status: Never   Smokeless tobacco: Never  Vaping Use   Vaping status: Never Used  Substance and Sexual Activity   Alcohol use: No   Drug use: No   Sexual activity: Not on file  Other Topics Concern   Not on file  Social History Narrative   Lives at home with his wife and granddaughter.   Left-handed.   3 cups caffeine per day.   Social Drivers of Corporate investment banker Strain: Low Risk  (01/21/2023)   Overall Financial Resource Strain (CARDIA)    Difficulty of Paying Living Expenses: Not hard at all  Food Insecurity: No Food Insecurity (01/21/2023)   Hunger Vital Sign    Worried About Running Out of Food in the Last Year: Never true    Ran Out of Food in the Last Year: Never true  Transportation Needs: No Transportation Needs (01/21/2023)   PRAPARE - Transportation    Lack  of Transportation (Medical): No    Lack of Transportation (Non-Medical): No  Physical Activity: Inactive (01/21/2023)   Exercise Vital Sign    Days of Exercise per Week: 0 days    Minutes of Exercise per Session: 0 min  Stress: No Stress Concern Present (01/21/2023)   [employer] of Occupational Health - Occupational Stress Questionnaire    Feeling of Stress : Not at all  Social Connections: Socially Integrated (01/21/2023)   Social Connection and Isolation Panel [NHANES]    Frequency of Communication with Friends and Family: More than three times a week    Frequency of Social Gatherings with Friends and Family: More than three times a week    Attends Religious Services: More than 4 times per year    Active Member of Clubs or Organizations: Yes    Attends Banker Meetings: More than 4 times per year     Marital Status: Married  Catering manager Violence: Not At Risk (01/21/2023)   Humiliation, Afraid, Rape, and Kick questionnaire    Fear of Current or Ex-Partner: No    Emotionally Abused: No    Physically Abused: No    Sexually Abused: No    Past Surgical History:  Procedure Laterality Date   APPENDECTOMY  AGE EARLY 20'S   CAPD INSERTION N/A 04/11/2023   Procedure: LAPAROSCOPIC INSERTION CONTINUOUS AMBULATORY PERITONEAL DIALYSIS  (CAPD) CATHETER WITH  OMENTOPEXY;  Surgeon: Leonie Douglas, MD;  Location: [facility] OR;  Service: Vascular;  Laterality: N/A;   COLONOSCOPY  12/30/2020   every 5 years   HYDROCELE EXCISION  03/31/2012   Procedure: HYDROCELECTOMY ADULT;  Surgeon: Marcine Matar, MD;  Location: [facility];  Service: Urology;  Laterality: Left;  45 MINS     LEFT EYE LASER RETINA REPAIR  SEPT 2012   RIGHT EYE VITRECTOMY/ INSERTION GLAUCOMA SETON/ LASER REPAIR  12-13-2008   RETINAL ARTERY OCCLUSION /NEOVASCULAR GLAUCOMA/ HEMORRHAGE   RIGHT EYE VITRETOMY/ INSERTION GLAUCOMA SETON X2/ LASER  03-24-2009   RECURRENT HEMORRHAGE/ OCCLUSION INTERNAL SETON   SHOULDER ARTHROSCOPY Right 2005   undescended right testicle removed  1994    Family History  Problem Relation Age of Onset   Glaucoma Mother    Diabetes Mother    Stomach cancer Maternal Grandfather    Stroke Maternal Grandmother     Allergies  Allergen Reactions   Lactose Intolerance (Gi) Diarrhea   Apple Pectin [Pectin] Itching    ITCHY THROAT   Lactose Other (See Comments)   Metformin And Related     D/t decreased kidney function    Peach [Prunus Persica] Itching    ITCHY THROAT   Morphine And Codeine Anxiety    Jittery    Current Outpatient Medications on File Prior to Visit  Medication Sig Dispense Refill   amLODipine (NORVASC) 5 MG tablet Take 1 tablet (5 mg total) by mouth daily. 90 tablet 3   aspirin EC 81 MG tablet Take 81 mg by mouth daily.     atorvastatin (LIPITOR) 10 MG tablet Take 1  tablet (10 mg total) by mouth every morning. 90 tablet 3   blood glucose meter kit and supplies KIT Dispense based on patient and insurance preference. Use up to four times daily as directed. 1 each 0   buPROPion (WELLBUTRIN XL) 300 MG 24 hr tablet Take 1 tablet (300 mg total) by mouth every morning. 90 tablet 1   calcitRIOL (ROCALTROL) 0.25 MCG capsule Take 0.25 mcg by mouth daily.  chlorthalidone (HYGROTON) 25 MG tablet Take 25 mg by mouth daily.     Continuous Blood Gluc Receiver (FREESTYLE LIBRE 14 DAY READER) DEVI Use with freestyle libre system 1 each 0   Continuous Glucose Sensor (FREESTYLE LIBRE 3 PLUS SENSOR) MISC Change sensor every 15 days. 6 each 3   Finerenone (KERENDIA) 10 MG TABS Take 10 mg by mouth daily.     gabapentin (NEURONTIN) 300 MG capsule TAKE ONE CAPSULE BY MOUTH EVERYDAY AT BEDTIME 90 capsule 3   gentamicin cream (GARAMYCIN) 0.1 % Apply topically daily.     insulin aspart (NOVOLOG FLEXPEN) 100 UNIT/ML FlexPen Inject 5 Units into the skin 3 (three) times daily with meals. 15 mL 1   insulin glargine (LANTUS SOLOSTAR) 100 UNIT/ML Solostar Pen Inject 20 Units into the skin 2 (two) times daily. 36 mL 0   Insulin Pen Needle (B-D ULTRAFINE III SHORT PEN) 31G X 8 MM MISC USE WITH LANUTS PEN 100 each 3   lactulose (CHRONULAC) 10 GM/15ML solution Take by mouth.     Lancets (ONETOUCH DELICA PLUS LANCET33G) MISC Apply topically.     losartan (COZAAR) 100 MG tablet Take 1 tablet (100 mg total) by mouth every morning. 90 tablet 1   Methoxy PEG-Epoetin Beta (MIRCERA IJ) Inject into the skin.     metoprolol tartrate (LOPRESSOR) 50 MG tablet Take 1 tablet (50 mg total) by mouth 2 (two) times daily. 180 tablet 3   ONETOUCH ULTRA test strip USE TO check blood glucose UP TO four times daily AS DIRECTED     oxyCODONE-acetaminophen (PERCOCET) 5-325 MG tablet Take 1 tablet by mouth every 6 (six) hours as needed for severe pain. 20 tablet 0   tiZANidine (ZANAFLEX) 2 MG tablet TAKE ONE TABLET  BY MOUTH EVERYDAY AT BEDTIME 90 tablet 3   torsemide (DEMADEX) 20 MG tablet Take 20 mg by mouth 2 (two) times daily.     chlorproMAZINE (THORAZINE) 25 MG tablet Take 1 tablet (25 mg total) by mouth 3 (three) times daily as needed for up to 5 days for hiccoughs. 15 tablet 0   glipiZIDE (GLUCOTROL XL) 5 MG 24 hr tablet Take 1 tablet (5 mg total) by mouth 2 (two) times daily. 180 tablet 1   No current facility-administered medications on file prior to visit.    BP 120/64   Temp 97.9 F (36.6 C) (Oral)   Ht 5\' 9"  (1.753 m)   Wt 175 lb (79.4 kg)   BMI 25.84 kg/m       Objective:   Physical Exam Vitals and nursing note reviewed.  Constitutional:      Appearance: Normal appearance.  Cardiovascular:     Rate and Rhythm: Normal rate and regular rhythm.     Pulses: Normal pulses.     Heart sounds: Normal heart sounds.  Pulmonary:     Effort: Pulmonary effort is normal.     Breath sounds: Normal breath sounds.  Musculoskeletal:     Right lower leg: No edema.     Left lower leg: No edema.  Skin:    General: Skin is warm and dry.  Neurological:     General: No focal deficit present.     Mental Status: He is alert and oriented to person, place, and time.  Psychiatric:        Mood and Affect: Mood normal.        Behavior: Behavior normal.        Thought Content: Thought content normal.  Judgment: Judgment normal.        Assessment & Plan:  1. Type 2 diabetes mellitus with diabetic neuropathy, with long-term current use of insulin (HCC) (Primary)  - POC HgB A1c- 9.5  - Has improved but BS not at goal  - I am going to have him start taking Norvasc 8 units BID with Lantus 25 units BID. He needs to take his insulin and glipizide twice a day.  - Follow up in 3 months   2. Current use of insulin (HCC)   3. 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 No change in medication    Shirline Frees, NP

## 2023-08-29 NOTE — Patient Instructions (Signed)
There are no preventive care reminders to display for this patient.     02/28/2023    7:12 AM 01/21/2023   12:40 PM 01/08/2023    8:36 AM  Depression screen PHQ 2/9  Decreased Interest 0 0 1  Down, Depressed, Hopeless 0 0 0  PHQ - 2 Score 0 0 1  Altered sleeping 2 0 3  Tired, decreased energy 0 0 3  Change in appetite 1 0 2  Feeling bad or failure about yourself  0 0 1  Trouble concentrating 0 0 0  Moving slowly or fidgety/restless 1 0 3  Suicidal thoughts 0 0 1  PHQ-9 Score 4 0 14  Difficult doing work/chores Not difficult at all Not difficult at all Somewhat difficult

## 2023-09-03 DIAGNOSIS — Z992 Dependence on renal dialysis: Secondary | ICD-10-CM | POA: Diagnosis not present

## 2023-09-03 DIAGNOSIS — N186 End stage renal disease: Secondary | ICD-10-CM | POA: Diagnosis not present

## 2023-09-03 DIAGNOSIS — E1122 Type 2 diabetes mellitus with diabetic chronic kidney disease: Secondary | ICD-10-CM | POA: Diagnosis not present

## 2023-09-04 DIAGNOSIS — N2581 Secondary hyperparathyroidism of renal origin: Secondary | ICD-10-CM | POA: Diagnosis not present

## 2023-09-04 DIAGNOSIS — D513 Other dietary vitamin B12 deficiency anemia: Secondary | ICD-10-CM | POA: Diagnosis not present

## 2023-09-04 DIAGNOSIS — R82998 Other abnormal findings in urine: Secondary | ICD-10-CM | POA: Diagnosis not present

## 2023-09-04 DIAGNOSIS — N2589 Other disorders resulting from impaired renal tubular function: Secondary | ICD-10-CM | POA: Diagnosis not present

## 2023-09-04 DIAGNOSIS — Z992 Dependence on renal dialysis: Secondary | ICD-10-CM | POA: Diagnosis not present

## 2023-09-04 DIAGNOSIS — Z79899 Other long term (current) drug therapy: Secondary | ICD-10-CM | POA: Diagnosis not present

## 2023-09-04 DIAGNOSIS — D631 Anemia in chronic kidney disease: Secondary | ICD-10-CM | POA: Diagnosis not present

## 2023-09-04 DIAGNOSIS — N186 End stage renal disease: Secondary | ICD-10-CM | POA: Diagnosis not present

## 2023-09-04 DIAGNOSIS — E1122 Type 2 diabetes mellitus with diabetic chronic kidney disease: Secondary | ICD-10-CM | POA: Diagnosis not present

## 2023-09-04 DIAGNOSIS — Z4932 Encounter for adequacy testing for peritoneal dialysis: Secondary | ICD-10-CM | POA: Diagnosis not present

## 2023-09-04 DIAGNOSIS — D509 Iron deficiency anemia, unspecified: Secondary | ICD-10-CM | POA: Diagnosis not present

## 2023-09-04 DIAGNOSIS — K769 Liver disease, unspecified: Secondary | ICD-10-CM | POA: Diagnosis not present

## 2023-09-04 DIAGNOSIS — E440 Moderate protein-calorie malnutrition: Secondary | ICD-10-CM | POA: Diagnosis not present

## 2023-09-09 DIAGNOSIS — M47816 Spondylosis without myelopathy or radiculopathy, lumbar region: Secondary | ICD-10-CM | POA: Diagnosis not present

## 2023-09-09 DIAGNOSIS — I639 Cerebral infarction, unspecified: Secondary | ICD-10-CM | POA: Diagnosis not present

## 2023-09-16 ENCOUNTER — Encounter (HOSPITAL_COMMUNITY): Payer: Self-pay | Admitting: Emergency Medicine

## 2023-09-16 ENCOUNTER — Emergency Department (HOSPITAL_COMMUNITY)
Admission: EM | Admit: 2023-09-16 | Discharge: 2023-09-16 | Disposition: A | Discharge status: Home / Self Care | Payer: PPO

## 2023-09-16 ENCOUNTER — Emergency Department (HOSPITAL_COMMUNITY): Payer: PPO

## 2023-09-16 ENCOUNTER — Ambulatory Visit: Payer: Self-pay | Admitting: Adult Health

## 2023-09-16 ENCOUNTER — Other Ambulatory Visit: Payer: Self-pay

## 2023-09-16 DIAGNOSIS — N390 Urinary tract infection, site not specified: Secondary | ICD-10-CM | POA: Diagnosis not present

## 2023-09-16 DIAGNOSIS — Z794 Long term (current) use of insulin: Secondary | ICD-10-CM | POA: Diagnosis not present

## 2023-09-16 DIAGNOSIS — E1122 Type 2 diabetes mellitus with diabetic chronic kidney disease: Secondary | ICD-10-CM | POA: Diagnosis not present

## 2023-09-16 DIAGNOSIS — I120 Hypertensive chronic kidney disease with stage 5 chronic kidney disease or end stage renal disease: Secondary | ICD-10-CM | POA: Diagnosis not present

## 2023-09-16 DIAGNOSIS — N200 Calculus of kidney: Secondary | ICD-10-CM | POA: Diagnosis not present

## 2023-09-16 DIAGNOSIS — Z992 Dependence on renal dialysis: Secondary | ICD-10-CM | POA: Diagnosis not present

## 2023-09-16 DIAGNOSIS — Z7984 Long term (current) use of oral hypoglycemic drugs: Secondary | ICD-10-CM | POA: Insufficient documentation

## 2023-09-16 DIAGNOSIS — Z79899 Other long term (current) drug therapy: Secondary | ICD-10-CM | POA: Insufficient documentation

## 2023-09-16 DIAGNOSIS — R109 Unspecified abdominal pain: Secondary | ICD-10-CM

## 2023-09-16 DIAGNOSIS — N186 End stage renal disease: Secondary | ICD-10-CM | POA: Insufficient documentation

## 2023-09-16 DIAGNOSIS — R1030 Lower abdominal pain, unspecified: Secondary | ICD-10-CM | POA: Diagnosis present

## 2023-09-16 LAB — COMPREHENSIVE METABOLIC PANEL
ALT: 14 U/L (ref 0–44)
AST: 17 U/L (ref 15–41)
Albumin: 2.4 g/dL — ABNORMAL LOW (ref 3.5–5.0)
Alkaline Phosphatase: 91 U/L (ref 38–126)
Anion gap: 10 (ref 5–15)
BUN: 39 mg/dL — ABNORMAL HIGH (ref 8–23)
CO2: 27 mmol/L (ref 22–32)
Calcium: 7.9 mg/dL — ABNORMAL LOW (ref 8.9–10.3)
Chloride: 97 mmol/L — ABNORMAL LOW (ref 98–111)
Creatinine, Ser: 3.09 mg/dL — ABNORMAL HIGH (ref 0.61–1.24)
GFR, Estimated: 21 mL/min — ABNORMAL LOW (ref >60)
Glucose, Bld: 366 mg/dL — ABNORMAL HIGH (ref 70–99)
Potassium: 3.6 mmol/L (ref 3.5–5.1)
Sodium: 134 mmol/L — ABNORMAL LOW (ref 135–145)
Total Bilirubin: 0.6 mg/dL (ref 0.0–1.2)
Total Protein: 6.1 g/dL — ABNORMAL LOW (ref 6.5–8.1)

## 2023-09-16 LAB — URINALYSIS, ROUTINE W REFLEX MICROSCOPIC
Bilirubin Urine: NEGATIVE
Glucose, UA: >=500 mg/dL — AB
Hgb urine dipstick: NEGATIVE
Ketones, ur: NEGATIVE mg/dL
Nitrite: NEGATIVE
Protein, ur: 100 mg/dL — AB
Specific Gravity, Urine: 1.01 (ref 1.005–1.030)
WBC, UA: >50 WBC/hpf (ref 0–5)
pH: 6 (ref 5.0–8.0)

## 2023-09-16 LAB — CBC
HCT: 28.8 % — ABNORMAL LOW (ref 39.0–52.0)
Hemoglobin: 9.5 g/dL — ABNORMAL LOW (ref 13.0–17.0)
MCH: 31.7 pg (ref 26.0–34.0)
MCHC: 33 g/dL (ref 30.0–36.0)
MCV: 96 fL (ref 80.0–100.0)
Platelets: 424 10*3/uL — ABNORMAL HIGH (ref 150–400)
RBC: 3 MIL/uL — ABNORMAL LOW (ref 4.22–5.81)
RDW: 13.1 % (ref 11.5–15.5)
WBC: 5.3 10*3/uL (ref 4.0–10.5)
nRBC: 0 % (ref 0.0–0.2)

## 2023-09-16 LAB — LIPASE, BLOOD: Lipase: 27 U/L (ref 11–51)

## 2023-09-16 MED ORDER — HYDROMORPHONE HCL 1 MG/ML IJ SOLN
0.5000 mg | Freq: Once | INTRAMUSCULAR | Status: AC
  Administered 2023-09-16: 0.5 mg via INTRAVENOUS
  Filled 2023-09-16: qty 1

## 2023-09-16 MED ORDER — OXYCODONE-ACETAMINOPHEN 5-325 MG PO TABS: 1.0000 | ORAL_TABLET | Freq: Four times a day (QID) | ORAL | 0 refills | Status: DC | PRN

## 2023-09-16 MED ORDER — ONDANSETRON HCL 4 MG/2ML IJ SOLN
4.0000 mg | Freq: Once | INTRAMUSCULAR | Status: AC
Start: 2023-09-16 — End: 2023-09-16
  Administered 2023-09-16: 4 mg via INTRAVENOUS
  Filled 2023-09-16: qty 2

## 2023-09-16 MED ORDER — ONDANSETRON 4 MG PO TBDP: 4.0000 mg | ORAL_TABLET | Freq: Three times a day (TID) | ORAL | 0 refills | Status: DC | PRN

## 2023-09-16 MED ORDER — CEPHALEXIN 500 MG PO CAPS: 500.0000 mg | ORAL_CAPSULE | Freq: Four times a day (QID) | ORAL | 0 refills | Status: DC

## 2023-09-16 MED ORDER — SODIUM CHLORIDE 0.9 % IV SOLN
1.0000 g | Freq: Once | INTRAVENOUS | Status: AC
  Administered 2023-09-16: 1 g via INTRAVENOUS
  Filled 2023-09-16: qty 10

## 2023-09-16 NOTE — Telephone Encounter (Signed)
  Chief Complaint: vomiting Symptoms: vomiting, increased urination, increased thirst, vomiting, abdominal pain, weakness Frequency: vomiting since Friday at 0200 was every 10 minutes, wife reports only 2 episodes in last 24 hours Pertinent Negatives: Patient's wife denies fevers, chest pain, SOB. diarrhea Disposition: [x] ED /[] Urgent Care (no appt availability in office) / [] Appointment(In office/virtual)/ []  [place] [facility]/ [] Home Care/ [] Refused Recommended Disposition /[] [facility]/ []  Follow-up with PCP Additional Notes: Wife reports patient is a dialysis patient and diabetic. Requested she check patient's blood glucose monitor and she states it is 382. She states his readings over the past couple of days have trended over 200. She states she is unsure if patient will be agreeable to go to ED but she will try to take him.  Copied from CRM [id number]. Topic: Clinical - Pink Word Triage >> Sep 16, 2023  8:55 AM Burnard DEL wrote: Reason for Triage: vomiting ,not able to eat since Friday 09/13/2023 Reason for Disposition  [1] Vomiting AND [2] signs of dehydration (e.g., very dry mouth, lightheaded, dark urine)  High-risk adult (e.g., diabetes mellitus, brain tumor, V-P shunt, hernia)  Answer Assessment - Initial Assessment Questions 1. VOMITING SEVERITY: How many times have you vomited in the past 24 hours?     - MILD:  1 - 2 times/day    - MODERATE: 3 - 5 times/day, decreased oral intake without significant weight loss or symptoms of dehydration    - SEVERE: 6 or more times/day, vomits everything or nearly everything, with significant weight loss, symptoms of dehydration      Patient's wife reports about 2 episodes over the past 24 hours.  2. ONSET: When did the vomiting begin?      0200 am Friday morning, about every 10 minutes vomiting.  3. FLUIDS: What fluids or food have you vomited up today? Have you been able to keep any fluids down?     Wife states she has  been giving him protein drinks and yesterday he was unable to keep them down. She states this morning he was able to eat a half bowl of cereal without vomiting it. She states he is drinking a lot of water  and Gatorade.  4. ABDOMEN PAIN: Are your having any abdomen pain? If Yes : How bad is it and what does it feel like? (e.g., crampy, dull, intermittent, constant)      Yes; she states he has been complaining of constant abdominal pain.  5. DIARRHEA: Is there any diarrhea? If Yes, ask: How many times today?      Denies.  6. CONTACTS: Is there anyone else in the family with the same symptoms?      Denies.  7. CAUSE: What do you think is causing your vomiting?     Wife states he was placed on a stronger solution for his dialysis on Thursday and they think it is related to that as he felt sick that night.  8. HYDRATION STATUS: Any signs of dehydration? (e.g., dry mouth [not only dry lips], too weak to stand) When did you last urinate?     Wife reports increased urination, denies signs of dehydration but states he has felt weak from throwing up.  9. OTHER SYMPTOMS: Do you have any other symptoms? (e.g., fever, headache, vertigo, vomiting blood or coffee grounds, recent head injury)     Increased thirst.  Protocols used: Vomiting-A-AH, Diabetes - High Blood Sugar-A-AH

## 2023-09-16 NOTE — ED Triage Notes (Signed)
 Pt reports abdominal pain, hiccups, and vomiting that started Thursday. Pt does do home peritoneal dialysis. Denies fevers.

## 2023-09-16 NOTE — ED Provider Notes (Signed)
 [place] EMERGENCY DEPARTMENT AT [facility] Provider Note   CSN: [id number] Arrival date & time: 09/16/23  1126     History  Chief Complaint  Patient presents with   Abdominal Pain    Russell Austin is a 73 y.o. male.  73 year old male with past medical history of diabetes, hypertension, and end-stage renal disease on peritoneal dialysis presented to the emergency department today with lower abdominal pain.  The patient states this began on Thursday.  He states this occurred after using the red bag for his peritoneal dialysis.  He states that he woke up overnight with nausea and vomiting.  He reports he has had multiple episodes of nonbloody, nonbloody bilious emesis.  He denies any blood in his stool or dark stools.  He denies any fevers.  He states the pain is in the center of his abdomen does not radiate.  He reports decreased appetite with this.  He denies any associated urinary symptoms.  He came to the emergency department today for further evaluation due to these ongoing symptoms.  He denies any diarrhea.   Abdominal Pain      Home Medications Prior to Admission medications   Medication Sig Start Date End Date Taking? Authorizing Provider  cephALEXin  (KEFLEX ) 500 MG capsule Take 1 capsule (500 mg total) by mouth 4 (four) times daily. 09/16/23  Yes Ula Prentice SAUNDERS, MD  ondansetron  (ZOFRAN -ODT) 4 MG disintegrating tablet Take 1 tablet (4 mg total) by mouth every 8 (eight) hours as needed for nausea or vomiting. 09/16/23  Yes Ula Prentice SAUNDERS, MD  oxyCODONE -acetaminophen  (PERCOCET/ROXICET) 5-325 MG tablet Take 1 tablet by mouth every 6 (six) hours as needed for severe pain (pain score 7-10). 09/16/23  Yes Ula Prentice SAUNDERS, MD  amLODipine  (NORVASC ) 5 MG tablet Take 1 tablet (5 mg total) by mouth daily. 06/11/23   Nafziger, Darleene, NP  aspirin  EC 81 MG tablet Take 81 mg by mouth daily.    [provider]  atorvastatin  (LIPITOR) 10 MG tablet Take 1 tablet (10 mg  total) by mouth every morning. 05/30/23   Nafziger, Darleene, NP  blood glucose meter kit and supplies KIT Dispense based on patient and insurance preference. Use up to four times daily as directed. 08/21/22   Nafziger, Darleene, NP  buPROPion  (WELLBUTRIN  XL) 300 MG 24 hr tablet Take 1 tablet (300 mg total) by mouth every morning. 06/11/23   Nafziger, Darleene, NP  calcitRIOL  (ROCALTROL ) 0.25 MCG capsule Take 0.25 mcg by mouth daily. 01/25/23   [provider]  chlorproMAZINE  (THORAZINE ) 25 MG tablet Take 1 tablet (25 mg total) by mouth 3 (three) times daily as needed for up to 5 days for hiccoughs. 08/14/23 08/19/23  Nafziger, Darleene, NP  chlorthalidone (HYGROTON) 25 MG tablet Take 25 mg by mouth daily.    [provider]  Continuous Blood Gluc Receiver (FREESTYLE LIBRE 14 DAY READER) DEVI Use with freestyle libre system 06/26/22   Nafziger, Darleene, NP  Continuous Glucose Sensor (FREESTYLE LIBRE 3 PLUS SENSOR) MISC Change sensor every 15 days. 07/09/23   Nafziger, Darleene, NP  Finerenone (KERENDIA) 10 MG TABS Take 10 mg by mouth daily.    [provider]  gabapentin  (NEURONTIN ) 300 MG capsule TAKE ONE CAPSULE BY MOUTH EVERYDAY AT BEDTIME 05/30/23   Nafziger, Darleene, NP  gentamicin  cream (GARAMYCIN ) 0.1 % Apply topically daily. 04/23/23   [provider]  glipiZIDE  (GLUCOTROL  XL) 5 MG 24 hr tablet Take 1 tablet (5 mg total) by mouth 2 (  two) times daily. 05/30/23 08/28/23  Nafziger, Darleene, NP  insulin  aspart (NOVOLOG  FLEXPEN) 100 UNIT/ML FlexPen Inject 5 Units into the skin 3 (three) times daily with meals. Patient taking differently: Inject 8 Units into the skin 2 (two) times daily before lunch and supper. 06/26/23   Nafziger, Darleene, NP  insulin  glargine (LANTUS  SOLOSTAR) 100 UNIT/ML Solostar Pen Inject 20 Units into the skin 2 (two) times daily. Patient taking differently: Inject 25 Units into the skin 2 (two) times daily. 06/26/23 09/24/23  Nafziger, Darleene, NP  Insulin  Pen Needle (B-D ULTRAFINE  III SHORT PEN) 31G X 8 MM MISC USE WITH LANUTS PEN 08/21/23   Nafziger, Darleene, NP  lactulose (CHRONULAC) 10 GM/15ML solution Take by mouth. 05/15/23   [provider]  Lancets [facility] DELICA PLUS [place]) MISC Apply topically. 08/23/22   [provider]  losartan  (COZAAR ) 100 MG tablet Take 1 tablet (100 mg total) by mouth every morning. 06/11/23   Nafziger, Darleene, NP  Methoxy PEG-Epoetin  Beta (MIRCERA IJ) Inject into the skin. 08/22/23   [provider]  AISHA FLING test strip USE TO check blood glucose UP TO four times daily AS DIRECTED 08/23/22   [provider]  oxyCODONE -acetaminophen  (PERCOCET) 5-325 MG tablet Take 1 tablet by mouth every 6 (six) hours as needed for severe pain. 04/11/23 04/10/24  Baglia, Corrina, PA-C  tiZANidine  (ZANAFLEX ) 2 MG tablet TAKE ONE TABLET BY MOUTH EVERYDAY AT BEDTIME 05/30/23   Nafziger, Darleene, NP      Allergies    Lactose intolerance (gi), Apple pectin [pectin], Lactose, Metformin  and related, Peach [prunus persica], and Morphine  and codeine    Review of Systems   Review of Systems  Gastrointestinal:  Positive for abdominal pain.    Physical Exam Updated Vital Signs BP (!) 143/84 (BP Location: Left Arm)   Pulse 99   Temp 98.2 F (36.8 C) (Oral)   Resp 18   SpO2 99%  Physical Exam Vitals and nursing note reviewed.   Gen: NAD Eyes: PERRL, EOMI HEENT: no oropharyngeal swelling Neck: trachea midline Resp: clear to auscultation bilaterally Card: RRR, no murmurs, rubs, or gallops Abd: Patient has suprapubic tenderness with no guarding or rebound, no CVA tenderness Extremities: no calf tenderness, no edema Vascular: 2+ radial pulses bilaterally, 2+ DP pulses bilaterally Skin: no rashes Psyc: acting appropriately   ED Results / Procedures / Treatments   Labs (all labs ordered are listed, but only abnormal results are displayed) Labs Reviewed  COMPREHENSIVE METABOLIC PANEL - Abnormal; Notable for the following  components:      Result Value   Sodium 134 (*)    Chloride 97 (*)    Glucose, Bld 366 (*)    BUN 39 (*)    Creatinine, Ser 3.09 (*)    Calcium  7.9 (*)    Total Protein 6.1 (*)    Albumin  2.4 (*)    GFR, Estimated 21 (*)    All other components within normal limits  CBC - Abnormal; Notable for the following components:   RBC 3.00 (*)    Hemoglobin 9.5 (*)    HCT 28.8 (*)    Platelets 424 (*)    All other components within normal limits  URINALYSIS, ROUTINE W REFLEX MICROSCOPIC - Abnormal; Notable for the following components:   Color, Urine AMBER (*)    APPearance CLOUDY (*)    Glucose, UA >=500 (*)    Protein, ur 100 (*)    Leukocytes,Ua LARGE (*)    Bacteria, UA MANY (*)  All other components within normal limits  LIPASE, BLOOD    EKG None  Radiology CT ABDOMEN PELVIS WO CONTRAST Result Date: 09/16/2023 CLINICAL DATA:  Abdominal pain and vomiting. Home peritoneal dialysis. EXAM: CT ABDOMEN AND PELVIS WITHOUT CONTRAST TECHNIQUE: Multidetector CT imaging of the abdomen and pelvis was performed following the standard protocol without IV contrast. RADIATION DOSE REDUCTION: This exam was performed according to the departmental dose-optimization program which includes automated exposure control, adjustment of the mA and/or kV according to patient size and/or use of iterative reconstruction technique. COMPARISON:  CT dated 06/01/2023. FINDINGS: Evaluation of this exam is limited in the absence of intravenous contrast. Lower chest: Minimal bibasilar subpleural atelectasis. The visualized lung bases are otherwise clear. Moderate pneumoperitoneum as seen on the prior CT and likely related to peritoneal dialysis catheter. No free fluid. Hepatobiliary: The liver is unremarkable. No biliary dilatation. The gallbladder is unremarkable. Pancreas: Unremarkable. No pancreatic ductal dilatation or surrounding inflammatory changes. Spleen: Normal in size without focal abnormality. Adrenals/Urinary  Tract: The adrenal glands are unremarkable. Punctate nonobstructing left renal inferior pole calculus. No hydronephrosis. The right kidney is unremarkable. The visualized ureters and urinary bladder appear unremarkable. Stomach/Bowel: There is moderate stool throughout the colon. There is no bowel obstruction or active inflammation. Appendectomy. Vascular/Lymphatic: Minimal aortoiliac atherosclerotic disease. The IVC is unremarkable. No portal venous gas. There is no adenopathy. Reproductive: The prostate and seminal vesicles are grossly unremarkable. No pelvic mass Other: The peritoneal dialysis catheter in the pelvis. No fluid collection. Musculoskeletal: Degenerative changes of the spine and multilevel osteophyte. No acute osseous pathology. IMPRESSION: 1. No acute intra-abdominal or pelvic pathology. 2. Moderate pneumoperitoneum as seen on the prior CT and likely related to peritoneal dialysis catheter. 3. Punctate nonobstructing left renal inferior pole calculus. No hydronephrosis. 4.  Aortic Atherosclerosis (ICD10-I70.0). Electronically Signed   By: Vanetta Chou M.D.   On: 09/16/2023 17:28    Procedures Procedures    Medications Ordered in ED Medications  HYDROmorphone  (DILAUDID ) injection 0.5 mg (0.5 mg Intravenous Given 09/16/23 1703)  ondansetron  (ZOFRAN ) injection 4 mg (4 mg Intravenous Given 09/16/23 1703)  cefTRIAXone  (ROCEPHIN ) 1 g in sodium chloride  0.9 % 100 mL IVPB (0 g Intravenous Stopped 09/16/23 1818)    ED Course/ Medical Decision Making/ A&P                                 Medical Decision Making 73 year old male with past medical history of diabetes, hypertension, and end-stage renal disease on peritoneal dialysis presenting to the emergency department today with lower abdominal pain.  I will further evaluate the patient here with basic labs including LFTs and a lipase to evaluate for hepatobiliary pathology or pancreatitis.  Will obtain a urinalysis about for urinary tract  infection.  I will also obtain a CT scan to further evaluate for diverticulitis, colitis, perforated viscus, bowel obstruction, or other intra-abdominal pathology.  I will give the patient Dilaudid  and Zofran  for his symptoms that she does not tolerate morphine .  I will reevaluate for ultimate disposition.  He does not have any peritoneal signs here on exam.  If an alternative etiology is not found I will discuss case with his nephrologist for peritonitis although he again does not have any guarding or rebound here.  The patient CT scan is unremarkable.  There is a little bit of pneumoperitoneum as seen on previous CT scans secondary to his dialysis catheter.  I did call  discuss his case with Dr. Marlee from nephrology.  The patient is having some increased urinary frequency and his urinalysis does show findings concerning for infection.  I felt that it would be reasonable to discharge the patient if symptoms were controlled and treated for urinary tract infection in light of this but that if the patient was having severe symptoms or recurrent symptoms that admission and further evaluation for peritonitis would be warranted.  The patient is able to eat and was able to keep this down without difficulty.  He is comfortable on reassessment.  Ultimately he is discharged.  It was recommended that he hold on his home dialysis tonight and to call his nephrologist in the morning so I did relay this to the patient.  He is discharged with return precautions.  Amount and/or Complexity of Data Reviewed Labs: ordered.  Risk Prescription drug management.           Final Clinical Impression(s) / ED Diagnoses Final diagnoses:  Urinary tract infection without hematuria, site unspecified    Rx / DC Orders ED Discharge Orders          Ordered    cephALEXin  (KEFLEX ) 500 MG capsule  4 times daily        09/16/23 1901    oxyCODONE -acetaminophen  (PERCOCET/ROXICET) 5-325 MG tablet  Every 6 hours PRN         09/16/23 1901    ondansetron  (ZOFRAN -ODT) 4 MG disintegrating tablet  Every 8 hours PRN        09/16/23 1901              Ula Prentice SAUNDERS, MD 09/16/23 1902

## 2023-09-16 NOTE — Discharge Instructions (Addendum)
 Please take the antibiotics for the urine infection.  Take the Percocet as needed for pain.  Do not drive or drink alcohol while taking this as it may make you drowsy.  Take the Zofran  as needed for nausea.  Please hold off on your dialysis tonight.  Call your nephrologist first thing in the morning and let them know you were here for further instructions.  Return to the emergency department for worsening symptoms.

## 2023-09-16 NOTE — ED Provider Triage Note (Signed)
 Emergency Medicine Provider Triage Evaluation Note  Russell Austin , a 73 y.o. male  was evaluated in triage.  Pt complains of abdominal pain.  States same began on Thursday persistent since then.  Pain is in lower abdomen and does not radiate.  Peritoneal dialysis patient, no complications with this since pain started.  Notes nausea and vomiting as well.  No diarrhea.  Is having regular bowel movements.  Still makes urine.  Review of Systems  Positive:  Negative:   Physical Exam  BP 128/82   Pulse (!) 105   Temp 97.9 F (36.6 C) (Oral)   Resp 17   SpO2 100%  Gen:   Awake, no distress   Resp:  Normal effort  MSK:   Moves extremities without difficulty  Other:  Generalized abdominal TTP without rebound or guarding  Medical Decision Making  Medically screening exam initiated at 12:35 PM.  Appropriate orders placed.  Russell Austin was informed that the remainder of the evaluation will be completed by another provider, this initial triage assessment does not replace that evaluation, and the importance of remaining in the ED until their evaluation is complete.  Workup initiated   Takeila Thayne A, PA-C 09/16/23 1236

## 2023-09-17 ENCOUNTER — Telehealth: Payer: Self-pay

## 2023-09-17 ENCOUNTER — Other Ambulatory Visit: Payer: PPO

## 2023-09-17 NOTE — Progress Notes (Deleted)
   09/17/2023  Patient ID: Russell Austin, male   DOB: 1951-04-29, 73 y.o.   MRN: [id number]  Attempted to contact patient for scheduled appointment for medication management. Left HIPAA compliant message for patient to return my call at their convenience.

## 2023-09-17 NOTE — Transitions of Care (Post Inpatient/ED Visit) (Signed)
   09/17/2023  Name: EVRETT HAKIM MRN: [id number] DOB: 02/06/1951  Today's TOC FU Call Status: Today's TOC FU Call Status:: Unsuccessful Call (1st Attempt) Unsuccessful Call (1st Attempt) Date: 09/17/23  Attempted to reach the patient regarding the most recent Inpatient/ED visit.  Follow Up Plan: Additional outreach attempts will be made to reach the patient to complete the Transitions of Care (Post Inpatient/ED visit) call.   Signature Julian Lemmings, LPN [facility] Nurse Health Advisor Direct Dial  [phone]

## 2023-09-17 NOTE — Telephone Encounter (Signed)
 Pt did go to to the ED.

## 2023-09-17 NOTE — Transitions of Care (Post Inpatient/ED Visit) (Signed)
 09/17/2023  Name: Russell Austin MRN: [id number] DOB: 1951/06/18  Today's TOC FU Call Status: Today's TOC FU Call Status:: Successful TOC FU Call Completed Unsuccessful Call (1st Attempt) Date: 09/17/23 [facility] FU Call Complete Date: 09/17/23 Patient's Name and Date of Birth confirmed.  Transition Care Management Follow-up Telephone Call Date of Discharge: 09/16/23 Discharge Facility: [facility]) Type of Discharge: Emergency Department Reason for ED Visit: Other: (UTI) How have you been since you were released from the hospital?: Better Any questions or concerns?: No  Items Reviewed: Did you receive and understand the discharge instructions provided?: Yes Medications obtained,verified, and reconciled?: Yes (Medications Reviewed) Any new allergies since your discharge?: No Dietary orders reviewed?: Yes Do you have support at home?: Yes People in Home: spouse  Medications Reviewed Today: Medications Reviewed Today     Reviewed by Emmitt Pan, LPN (Licensed Practical Nurse) on 09/17/23 at 1440  Med List Status: <None>   Medication Order Taking? Sig Documenting Provider Last Dose Status Informant  amLODipine  (NORVASC ) 5 MG tablet [id number] No Take 1 tablet (5 mg total) by mouth daily. Nafziger, Darleene, NP Taking Active   aspirin  EC 81 MG tablet [id number] No Take 81 mg by mouth daily. [provider] Taking Active Self  atorvastatin  (LIPITOR) 10 MG tablet [id number] No Take 1 tablet (10 mg total) by mouth every morning. Nafziger, Darleene, NP Taking Active   blood glucose meter kit and supplies KIT [id number] No Dispense based on patient and insurance preference. Use up to four times daily as directed. Nafziger, Darleene, NP Taking Active Self  buPROPion  (WELLBUTRIN  XL) 300 MG 24 hr tablet [id number] No Take 1 tablet (300 mg total) by mouth every morning. Nafziger, Darleene, NP Taking Active   calcitRIOL  (ROCALTROL ) 0.25 MCG capsule [id number] No Take 0.25 mcg by mouth daily.  [provider] Taking Active Self  cephALEXin  (KEFLEX ) 500 MG capsule [id number]  Take 1 capsule (500 mg total) by mouth 4 (four) times daily. Ula Prentice SAUNDERS, MD  Active   chlorproMAZINE  (THORAZINE ) 25 MG tablet [id number]  Take 1 tablet (25 mg total) by mouth 3 (three) times daily as needed for up to 5 days for hiccoughs. Merna Darleene, NP  Expired 08/19/23 2359   chlorthalidone (HYGROTON) 25 MG tablet [id number] No Take 25 mg by mouth daily. [provider] Taking Active Self  Continuous Blood Gluc Receiver (FREESTYLE LIBRE 14 DAY READER) DEVI [id number] No Use with freestyle libre system Nafziger, [place], NP Taking Active Self  Continuous Glucose Sensor (FREESTYLE LIBRE 3 PLUS SENSOR) MISC [id number] No Change sensor every 15 days. Nafziger, Darleene, NP Taking Active   Finerenone (KERENDIA) 10 MG TABS [id number] No Take 10 mg by mouth daily. [provider] Taking Active Self  gabapentin  (NEURONTIN ) 300 MG capsule [id number] No TAKE ONE CAPSULE BY MOUTH EVERYDAY AT BEDTIME Nafziger, Darleene, NP Taking Active   gentamicin  cream (GARAMYCIN ) 0.1 % [id number] No Apply topically daily. [provider] Taking Active   glipiZIDE  (GLUCOTROL  XL) 5 MG 24 hr tablet [id number] No Take 1 tablet (5 mg total) by mouth 2 (two) times daily. Merna Darleene, NP Taking Expired 08/28/23 2359   insulin  aspart (NOVOLOG  FLEXPEN) 100 UNIT/ML FlexPen [id number] No Inject 5 Units into the skin 3 (three) times daily with meals.  Patient taking differently: Inject 8 Units into the skin 2 (two) times daily before lunch and supper.   Nafziger, Darleene, NP Taking Active   insulin  glargine (LANTUS  SOLOSTAR) 100 UNIT/ML Solostar Pen [id number] No Inject 20  Units into the skin 2 (two) times daily.  Patient taking differently: Inject 25 Units into the skin 2 (two) times daily.   Nafziger, Darleene, NP Taking Active   Insulin  Pen Needle (B-D ULTRAFINE III SHORT PEN) 31G X 8 MM MISC [id number] No USE WITH LANUTS PEN  Nafziger, Cory, NP Taking Active   lactulose (CHRONULAC) 10 GM/15ML solution [id number] No Take by mouth. [provider] Taking Active   Lancets JANETT [place] PLUS [place]) MISC [id number] No Apply topically. [provider] Taking Active Self  losartan  (COZAAR ) 100 MG tablet [id number] No Take 1 tablet (100 mg total) by mouth every morning. Merna Darleene, NP Taking Active   Methoxy PEG-Epoetin  Beta (MIRCERA IJ) [id number] No Inject into the skin. [provider] Taking Active   ondansetron  (ZOFRAN -ODT) 4 MG disintegrating tablet [id number]  Take 1 tablet (4 mg total) by mouth every 8 (eight) hours as needed for nausea or vomiting. Ula Prentice SAUNDERS, MD  Active   [facility] ULTRA test strip [id number] No USE TO check blood glucose UP TO four times daily AS DIRECTED [provider] Taking Active Self  oxyCODONE -acetaminophen  (PERCOCET) 5-325 MG tablet [id number] No Take 1 tablet by mouth every 6 (six) hours as needed for severe pain. Baglia, Corrina, PA-C Taking Active   oxyCODONE -acetaminophen  (PERCOCET/ROXICET) 5-325 MG tablet [id number]  Take 1 tablet by mouth every 6 (six) hours as needed for severe pain (pain score 7-10). Ula Prentice SAUNDERS, MD  Active   tiZANidine  (ZANAFLEX ) 2 MG tablet [id number] No TAKE ONE TABLET BY MOUTH EVERYDAY AT BEDTIME Nafziger, Darleene, NP Taking Active             Home Care and Equipment/Supplies: Were Home Health Services Ordered?: NA Any new equipment or medical supplies ordered?: NA  Functional Questionnaire: Do you need assistance with bathing/showering or dressing?: No Do you need assistance with meal preparation?: No Do you need assistance with eating?: No Do you have difficulty maintaining continence: No Do you need assistance with getting out of bed/getting out of a chair/moving?: No Do you have difficulty managing or taking your medications?: No  Follow up appointments reviewed: PCP Follow-up appointment confirmed?: Yes Date  of PCP follow-up appointment?: 09/24/23 Follow-up Provider: [facility] Follow-up appointment confirmed?: NA Do you need transportation to your follow-up appointment?: No Do you understand care options if your condition(s) worsen?: Yes-patient verbalized understanding    SIGNATURE Julian Lemmings, LPN [facility] Nurse Health Advisor Direct Dial  [phone]

## 2023-09-20 DIAGNOSIS — M47816 Spondylosis without myelopathy or radiculopathy, lumbar region: Secondary | ICD-10-CM | POA: Diagnosis not present

## 2023-09-20 DIAGNOSIS — I639 Cerebral infarction, unspecified: Secondary | ICD-10-CM | POA: Diagnosis not present

## 2023-09-23 DIAGNOSIS — M47816 Spondylosis without myelopathy or radiculopathy, lumbar region: Secondary | ICD-10-CM | POA: Diagnosis not present

## 2023-09-23 DIAGNOSIS — I639 Cerebral infarction, unspecified: Secondary | ICD-10-CM | POA: Diagnosis not present

## 2023-09-24 ENCOUNTER — Encounter: Payer: Self-pay | Admitting: Podiatry

## 2023-09-24 ENCOUNTER — Inpatient Hospital Stay: Payer: PPO | Admitting: Adult Health

## 2023-09-27 ENCOUNTER — Telehealth: Payer: Self-pay

## 2023-09-27 DIAGNOSIS — M47816 Spondylosis without myelopathy or radiculopathy, lumbar region: Secondary | ICD-10-CM | POA: Diagnosis not present

## 2023-09-27 DIAGNOSIS — I639 Cerebral infarction, unspecified: Secondary | ICD-10-CM | POA: Diagnosis not present

## 2023-09-27 NOTE — Progress Notes (Signed)
   09/27/2023  Patient ID: Russell Austin, male   DOB: 09-05-1950, 73 y.o.   MRN: [id number]  Attempted to contact patient to reschedule the follow up appt he missed on 09/17/23. Left HIPAA compliant message for patient to return my call at their convenience.   Sherrill Raring, PharmD Clinical Pharmacist [phone]

## 2023-10-01 DIAGNOSIS — M47816 Spondylosis without myelopathy or radiculopathy, lumbar region: Secondary | ICD-10-CM | POA: Diagnosis not present

## 2023-10-01 DIAGNOSIS — I639 Cerebral infarction, unspecified: Secondary | ICD-10-CM | POA: Diagnosis not present

## 2023-10-02 ENCOUNTER — Other Ambulatory Visit: Payer: PPO

## 2023-10-02 DIAGNOSIS — E1140 Type 2 diabetes mellitus with diabetic neuropathy, unspecified: Secondary | ICD-10-CM

## 2023-10-02 MED ORDER — BD PEN NEEDLE SHORT U/F 31G X 8 MM MISC: 11 refills | Status: DC

## 2023-10-02 NOTE — Progress Notes (Signed)
10/02/2023 Name: Russell Austin MRN: [id number] DOB: 08/11/1951  Chief Complaint  Patient presents with   Diabetes   Medication Management    CAESON FILIPPI is a 73 y.o. year old male who presented for a telephone visit.   They were referred to the pharmacist by their PCP for assistance in managing diabetes and complex medication management.    Subjective:  Care Team: Primary Care Provider: Shirline Frees, NP ; Next Scheduled Visit: 11/28/23  Medication Access/Adherence  Current Pharmacy:  [id number] - [place], [place] - [address] [place] [address] Phone: [phone] Fax: [phone]   Patient reports affordability concerns with their medications: No  Patient reports access/transportation concerns to their pharmacy: No  Patient reports adherence concerns with their medications:  No     Diabetes:  Current Medications: Lantus 25 units BID, Novolog 8 units with dinner, Glipizide 5mg  BID      Patient has yet to give himself more than one daily dose of Novolog, says he forgot  Objective:  Lab Results  Component Value Date   HGBA1C 9.5 (A) 08/29/2023    Lab Results  Component Value Date   CREATININE 3.09 (H) 09/16/2023   BUN 39 (H) 09/16/2023   NA 134 (L) 09/16/2023   K 3.6 09/16/2023   CL 97 (L) 09/16/2023   CO2 27 09/16/2023    Lab Results  Component Value Date   CHOL 90 02/28/2023   HDL 26.30 (L) 02/28/2023   LDLCALC 46 02/28/2023   TRIG 88.0 02/28/2023   CHOLHDL 3 02/28/2023    Medications Reviewed Today     Reviewed by Sherrill Raring, RPH (Pharmacist) on 10/02/23 at [id number]  Med List Status: <None>   Medication Order Taking? Sig Documenting Provider Last Dose Status Informant  amLODipine (NORVASC) 5 MG tablet [id number] Yes Take 1 tablet (5 mg total) by mouth daily. Nafziger, Kandee Keen, NP Taking Active   aspirin EC 81 MG tablet [id number] Yes Take 81 mg by mouth daily. [provider] Taking  Active Self  atorvastatin (LIPITOR) 10 MG tablet [id number] Yes Take 1 tablet (10 mg total) by mouth every morning. Nafziger, Kandee Keen, NP Taking Active   blood glucose meter kit and supplies KIT [id number]  Dispense based on patient and insurance preference. Use up to four times daily as directed. Nafziger, Kandee Keen, NP  Active Self  buPROPion (WELLBUTRIN XL) 300 MG 24 hr tablet [id number] Yes Take 1 tablet (300 mg total) by mouth every morning. Nafziger, Kandee Keen, NP Taking Active   calcitRIOL (ROCALTROL) 0.25 MCG capsule [id number] No Take 0.25 mcg by mouth daily.  Patient not taking: Reported on 10/02/2023   [provider] Not Taking Active Self  cephALEXin (KEFLEX) 500 MG capsule [id number] Yes Take 1 capsule (500 mg total) by mouth 4 (four) times daily. Durwin Glaze, MD Taking Active   chlorproMAZINE (THORAZINE) 25 MG tablet [id number]  Take 1 tablet (25 mg total) by mouth 3 (three) times daily as needed for up to 5 days for hiccoughs. Shirline Frees, NP  Expired 08/19/23 2359   chlorthalidone (HYGROTON) 25 MG tablet [id number] Yes Take 25 mg by mouth daily. [provider] Taking Active Self  Continuous Blood Gluc Receiver (FREESTYLE LIBRE [address] READER) DEVI [id number]  Use with freestyle libre system Nafziger, Kandee Keen, NP  Active Self  Continuous Glucose Sensor (FREESTYLE LIBRE 3 PLUS SENSOR) MISC [id number]  Change sensor every 15 days. Nafziger, Kandee Keen, NP  Active   Finerenone (KERENDIA) 10 MG  TABS [id number] Yes Take 10 mg by mouth daily. [provider] Taking Active Self  gabapentin (NEURONTIN) 300 MG capsule [id number] Yes TAKE ONE CAPSULE BY MOUTH EVERYDAY AT BEDTIME Nafziger, Kandee Keen, NP Taking Active   gentamicin cream (GARAMYCIN) 0.1 % [id number]  Apply topically daily. [provider]  Active   glipiZIDE (GLUCOTROL XL) 5 MG 24 hr tablet [id number]  Take 1 tablet (5 mg total) by mouth 2 (two) times daily. Nafziger, Kandee Keen, NP  Expired 08/28/23 2359   insulin aspart (NOVOLOG  FLEXPEN) 100 UNIT/ML FlexPen [id number]  Inject 5 Units into the skin 3 (three) times daily with meals.  Patient taking differently: Inject 8 Units into the skin 2 (two) times daily before lunch and supper.   Nafziger, Kandee Keen, NP  Active            Med Note Leitha Bleak, Milas Kocher   Wed Oct 02, 2023  9:33 AM) 8 units with lunch currently  insulin glargine (LANTUS SOLOSTAR) 100 UNIT/ML Solostar Pen [id number]  Inject 20 Units into the skin 2 (two) times daily.  Patient taking differently: Inject 25 Units into the skin 2 (two) times daily.   Shirline Frees, NP  Expired 09/24/23 2359   Insulin Pen Needle (B-D ULTRAFINE III SHORT PEN) 31G X 8 MM MISC [id number]  USE WITH LANUTS PEN Nafziger, Kandee Keen, NP  Active   lactulose (CHRONULAC) 10 GM/15ML solution [id number] Yes Take by mouth. [provider] Taking Active   Lancets Letta Pate [place] PLUS [place]) MISC [id number]  Apply topically. [provider]  Active Self  losartan (COZAAR) 100 MG tablet [id number] Yes Take 1 tablet (100 mg total) by mouth every morning. Shirline Frees, NP Taking Active   Methoxy PEG-Epoetin Beta (MIRCERA IJ) [id number]  Inject into the skin. [provider]  Active   ondansetron (ZOFRAN-ODT) 4 MG disintegrating tablet [id number] Yes Take 1 tablet (4 mg total) by mouth every 8 (eight) hours as needed for nausea or vomiting. Durwin Glaze, MD Taking Active   [facility] ULTRA test strip [id number]  USE TO check blood glucose UP TO four times daily AS DIRECTED [provider]  Active Self  oxyCODONE-acetaminophen (PERCOCET) 5-325 MG tablet [id number]  Take 1 tablet by mouth every 6 (six) hours as needed for severe pain. Baglia, Corrina, PA-C  Active   oxyCODONE-acetaminophen (PERCOCET/ROXICET) 5-325 MG tablet [id number] No Take 1 tablet by mouth every 6 (six) hours as needed for severe pain (pain score 7-10).  Patient not taking: Reported on 10/02/2023   Durwin Glaze, MD Not Taking Active   tiZANidine (ZANAFLEX)  2 MG tablet [id number] Yes TAKE ONE TABLET BY MOUTH EVERYDAY AT BEDTIME Nafziger, Kandee Keen, NP Taking Active               Assessment/Plan:   Diabetes: - Currently uncontrolled - Reviewed long term cardiovascular and renal outcomes of uncontrolled blood sugar - Reviewed goal A1c, goal fasting, and goal 2 hour post prandial glucose - INCREASE Novolog to 8 units BID with breakfast and dinner as previously instructed. Continue lantus 25 units BID and glipizide BID.     Follow Up Plan: 2 weeks  Sherrill Raring, PharmD Clinical Pharmacist [phone]

## 2023-10-02 NOTE — Addendum Note (Signed)
Addended by: Sherrill Raring on: 10/02/2023 10:59 AM   Modules accepted: Orders

## 2023-10-04 DIAGNOSIS — Z992 Dependence on renal dialysis: Secondary | ICD-10-CM | POA: Diagnosis not present

## 2023-10-04 DIAGNOSIS — M47816 Spondylosis without myelopathy or radiculopathy, lumbar region: Secondary | ICD-10-CM | POA: Diagnosis not present

## 2023-10-04 DIAGNOSIS — I639 Cerebral infarction, unspecified: Secondary | ICD-10-CM | POA: Diagnosis not present

## 2023-10-04 DIAGNOSIS — E1122 Type 2 diabetes mellitus with diabetic chronic kidney disease: Secondary | ICD-10-CM | POA: Diagnosis not present

## 2023-10-04 DIAGNOSIS — N186 End stage renal disease: Secondary | ICD-10-CM | POA: Diagnosis not present

## 2023-10-05 DIAGNOSIS — N186 End stage renal disease: Secondary | ICD-10-CM | POA: Diagnosis not present

## 2023-10-05 DIAGNOSIS — Z992 Dependence on renal dialysis: Secondary | ICD-10-CM | POA: Diagnosis not present

## 2023-10-05 DIAGNOSIS — D631 Anemia in chronic kidney disease: Secondary | ICD-10-CM | POA: Diagnosis not present

## 2023-10-05 DIAGNOSIS — N2581 Secondary hyperparathyroidism of renal origin: Secondary | ICD-10-CM | POA: Diagnosis not present

## 2023-10-08 ENCOUNTER — Encounter: Payer: Self-pay | Admitting: Podiatry

## 2023-10-08 ENCOUNTER — Ambulatory Visit: Payer: PPO | Admitting: Podiatry

## 2023-10-08 DIAGNOSIS — M79675 Pain in left toe(s): Secondary | ICD-10-CM | POA: Diagnosis not present

## 2023-10-08 DIAGNOSIS — M79674 Pain in right toe(s): Secondary | ICD-10-CM | POA: Diagnosis not present

## 2023-10-08 DIAGNOSIS — M21611 Bunion of right foot: Secondary | ICD-10-CM

## 2023-10-08 DIAGNOSIS — L909 Atrophic disorder of skin, unspecified: Secondary | ICD-10-CM

## 2023-10-08 DIAGNOSIS — M2141 Flat foot [pes planus] (acquired), right foot: Secondary | ICD-10-CM

## 2023-10-08 DIAGNOSIS — R600 Localized edema: Secondary | ICD-10-CM

## 2023-10-08 DIAGNOSIS — B351 Tinea unguium: Secondary | ICD-10-CM

## 2023-10-08 DIAGNOSIS — L84 Corns and callosities: Secondary | ICD-10-CM

## 2023-10-08 DIAGNOSIS — Z794 Long term (current) use of insulin: Secondary | ICD-10-CM

## 2023-10-08 DIAGNOSIS — E1140 Type 2 diabetes mellitus with diabetic neuropathy, unspecified: Secondary | ICD-10-CM

## 2023-10-08 DIAGNOSIS — M2170 Unequal limb length (acquired), unspecified site: Secondary | ICD-10-CM

## 2023-10-08 DIAGNOSIS — M21371 Foot drop, right foot: Secondary | ICD-10-CM

## 2023-10-08 DIAGNOSIS — M2041 Other hammer toe(s) (acquired), right foot: Secondary | ICD-10-CM

## 2023-10-08 NOTE — Progress Notes (Signed)
 Subjective:  Patient ID: Russell Austin, male    DOB: 1950/11/19,   MRN: [id number]  Chief Complaint  Patient presents with   Nail Problem    RFC    73 y.o. male presents for concern of thickened elongated and painful nails that are difficult to trim. Requesting to have them trimmed today. Relates burning and tingling in their feet. Patient is diabetic and last A1c was  Lab Results  Component Value Date   HGBA1C 9.5 (A) 08/29/2023   .  Patient is due for new diabetic shoes and requesting new pair today. Also relates has seen PT and they were recommended a KAFO for patient to aid with ambulation.   PCP:  Russell Huxley, NP    . Denies any other pedal complaints. Denies n/v/f/c.   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 CFT <3 seconds. Absent hair growth on digits. Edema noted to bilateral lower extremities. Xerosis noted bilaterally.  Skin. No lacerations or abrasions bilateral feet. Nails 1-5 bilateral  are thickened discolored and elongated with subungual debris. Hyperkeratotic lesion noted plantar left heel and posterior right heel.  Musculoskeletal: MMT 4/5 bilateral lower extremities in DF, PF, Inversion and Eversion. Deceased ROM in DF of ankle joint. Mild HAV deformity bilateral and hammered digits 2-5 bilateral.  Neurological: Sensation intact to light touch. Protective sensation diminished bilateral.    ABI Findings:  +---------+------------------+-----+--------+--------+  Right   Rt Pressure (mmHg)IndexWaveformComment   +---------+------------------+-----+--------+--------+  Brachial 109                                       +---------+------------------+-----+--------+--------+  PTA     181               1.57                   +---------+------------------+-----+--------+--------+  DP      171               1.49                   +---------+------------------+-----+--------+--------+  Great Toe165               1.43                   +---------+------------------+-----+--------+--------+   +---------+------------------+-----+--------+-------+  Left    Lt Pressure (mmHg)IndexWaveformComment  +---------+------------------+-----+--------+-------+  Brachial 115                                     +---------+------------------+-----+--------+-------+  PTA     163               1.42                  +---------+------------------+-----+--------+-------+  DP      180               1.57                  +---------+------------------+-----+--------+-------+  Great Toe164               1.43                  +---------+------------------+-----+--------+-------+       TOES Findings:  +----------+---------------+--------+-------+  Right ToesPressure (mmHg)WaveformComment  +----------+---------------+--------+-------+  1st Digit                Normal           +----------+---------------+--------+-------+  2nd Digit                Normal           +----------+---------------+--------+-------+  3rd Digit                Normal           +----------+---------------+--------+-------+  4th Digit                Normal           +----------+---------------+--------+-------+  5th Digit                Normal           +----------+---------------+--------+-------+      +---------+---------------+--------+-------+  Left ToesPressure (mmHg)WaveformComment  +---------+---------------+--------+-------+  1st Digit               Normal           +---------+---------------+--------+-------+  2nd Digit                Normal           +---------+---------------+--------+-------+  3rd Digit               Normal           +---------+---------------+--------+-------+  4th Digit               Normal           +---------+---------------+--------+-------+  5th Digit               Normal           +---------+---------------+--------+-------+           Summary:  Right: Resting right ankle-brachial index indicates noncompressible right  lower extremity arteries. The right toe-brachial index is normal.   Left: Resting left ankle-brachial index indicates noncompressible left  lower extremity arteries. The left toe-brachial index is normal.   Assessment:   1. Pain due to onychomycosis of toenails of both feet   2. Callus   3. Type 2 diabetes mellitus with diabetic neuropathy, with long-term current use of insulin  (HCC)   4. Bilateral bunions   5. Fat pad atrophy of foot   6. Pes planus of both feet   7. Bilateral lower extremity edema   8. Hammertoe, bilateral   9. Lower limb length difference   10. Foot drop, right        Plan:  Patient was evaluated and treated and all questions answered. -Discussed and educated patient on diabetic foot care, especially with  regards to the vascular, neurological and musculoskeletal systems.  -Stressed the importance of good glycemic control and the detriment of not  controlling glucose levels in relation to the foot. -ABIS reviewed with normal TBI and non compressible ABI.  -Discussed supportive shoes at all times and checking feet regularly.  -Mechanically debrided all nails 1-5 bilateral using sterile nail nipper and filed with dremel without incident  -Hyperkeratotc area debrided with chisel without incident.  DM shoes ordered  KAFO as recommended by PT prescription for [facility] provided. This is needed for proper ambulation. To prevent falls and injury and pain. Patient with drop foot and right sided hemiparesis and was noted to have  issues with extension at knee.  -Answered all patient questions -Patient to return  in 3 months for at risk foot care -Patient advised to call the office if any problems or questions arise in the meantime.   Russell Austin, DPM

## 2023-10-09 DIAGNOSIS — M47816 Spondylosis without myelopathy or radiculopathy, lumbar region: Secondary | ICD-10-CM | POA: Diagnosis not present

## 2023-10-09 DIAGNOSIS — I639 Cerebral infarction, unspecified: Secondary | ICD-10-CM | POA: Diagnosis not present

## 2023-10-14 DIAGNOSIS — I639 Cerebral infarction, unspecified: Secondary | ICD-10-CM | POA: Diagnosis not present

## 2023-10-14 DIAGNOSIS — M47816 Spondylosis without myelopathy or radiculopathy, lumbar region: Secondary | ICD-10-CM | POA: Diagnosis not present

## 2023-10-16 ENCOUNTER — Other Ambulatory Visit: Payer: PPO

## 2023-10-16 DIAGNOSIS — E1140 Type 2 diabetes mellitus with diabetic neuropathy, unspecified: Secondary | ICD-10-CM

## 2023-10-16 NOTE — Progress Notes (Signed)
10/16/2023 Name: Russell Austin MRN: [id number] DOB: [birth year]  Chief Complaint  Patient presents with   Diabetes   Medication Management    Russell Austin is a 73 y.o. year old male who presented for a telephone visit.   They were referred to the pharmacist by their PCP for assistance in managing diabetes and complex medication management.    Subjective:  Care Team: Primary Care Provider: Shirline Frees, NP ; Next Scheduled Visit: 11/28/23  Medication Access/Adherence  Current Pharmacy:  [id number] - [place], [place] - [address] [place] [address] Phone: [phone] Fax: [phone]   Patient reports affordability concerns with their medications: No  Patient reports access/transportation concerns to their pharmacy: No  Patient reports adherence concerns with their medications:  No     Diabetes:  Current Medications: Lantus 25 units BID, Novolog 8 units with dinner, Glipizide 5mg  BID    For some reason, data from last 5 days is not connecting but patient state he does have sensor on and it is working. Patient sugar readings are all over the place, with some days of no highs but low as 40, and other days staying in the upper 200s-400.  Patient admits that if he feels his sugars are on the lower side, he will skip some or all doses of his insulin. This explains why readings are so up and down.   Objective:  Lab Results  Component Value Date   HGBA1C 9.5 (A) 08/29/2023    Lab Results  Component Value Date   CREATININE 3.09 (H) 09/16/2023   BUN 39 (H) 09/16/2023   NA 134 (L) 09/16/2023   K 3.6 09/16/2023   CL 97 (L) 09/16/2023   CO2 27 09/16/2023    Lab Results  Component Value Date   CHOL 90 02/28/2023   HDL 26.30 (L) 02/28/2023   LDLCALC 46 02/28/2023   TRIG 88.0 02/28/2023   CHOLHDL 3 02/28/2023    Medications Reviewed Today     Reviewed by Sherrill Raring, RPH (Pharmacist) on 10/16/23 at 1145   Med List Status: <None>   Medication Order Taking? Sig Documenting Provider Last Dose Status Informant  amLODipine (NORVASC) 5 MG tablet [id number] No Take 1 tablet (5 mg total) by mouth daily. Nafziger, Kandee Keen, NP Taking Active   aspirin EC 81 MG tablet [id number] No Take 81 mg by mouth daily. [provider] Taking Active Self  atorvastatin (LIPITOR) 10 MG tablet [id number] No Take 1 tablet (10 mg total) by mouth every morning. Nafziger, Kandee Keen, NP Taking Active   blood glucose meter kit and supplies KIT [id number] No Dispense based on patient and insurance preference. Use up to four times daily as directed. Nafziger, Kandee Keen, NP Taking Active Self  buPROPion (WELLBUTRIN XL) 300 MG 24 hr tablet [id number] No Take 1 tablet (300 mg total) by mouth every morning. Nafziger, Kandee Keen, NP Taking Active   calcitRIOL (ROCALTROL) 0.25 MCG capsule [id number] No Take 0.25 mcg by mouth daily.  Patient not taking: Reported on 10/02/2023   [provider] Not Taking Active Self  cephALEXin (KEFLEX) 500 MG capsule [id number] No Take 1 capsule (500 mg total) by mouth 4 (four) times daily. Durwin Glaze, MD Taking Active   chlorproMAZINE (THORAZINE) 25 MG tablet [id number]  Take 1 tablet (25 mg total) by mouth 3 (three) times daily as needed for up to 5 days for hiccoughs. Shirline Frees, NP  Expired 08/19/23 2359   chlorthalidone (HYGROTON) 25 MG tablet [id number]  No Take 25 mg by mouth daily. [provider] Taking Active Self  Continuous Blood Gluc Receiver (FREESTYLE LIBRE 14 DAY READER) DEVI [id number] No Use with freestyle libre system Nafziger, [place], NP Taking Active Self  Continuous Glucose Sensor (FREESTYLE LIBRE 3 PLUS SENSOR) MISC [id number] No Change sensor every 15 days. Nafziger, Kandee Keen, NP Taking Active   Finerenone (KERENDIA) 10 MG TABS [id number] No Take 10 mg by mouth daily. [provider] Taking Active Self  gabapentin (NEURONTIN) 300 MG capsule [id number] No TAKE ONE CAPSULE BY MOUTH  EVERYDAY AT BEDTIME Nafziger, Kandee Keen, NP Taking Active   gentamicin cream (GARAMYCIN) 0.1 % [id number] No Apply topically daily. [provider] Taking Active   glipiZIDE (GLUCOTROL XL) 5 MG 24 hr tablet [id number] No Take 1 tablet (5 mg total) by mouth 2 (two) times daily. Nafziger, Kandee Keen, NP Taking Expired 08/28/23 2359   insulin aspart (NOVOLOG FLEXPEN) 100 UNIT/ML FlexPen [id number] No Inject 5 Units into the skin 3 (three) times daily with meals.  Patient taking differently: Inject 8 Units into the skin 2 (two) times daily before lunch and supper.   Nafziger, Kandee Keen, NP Taking Active            Med Note Leitha Bleak, Milas Kocher   Wed Oct 16, 2023 11:45 AM) Still only using once daily   insulin glargine (LANTUS SOLOSTAR) 100 UNIT/ML Solostar Pen [id number] No Inject 20 Units into the skin 2 (two) times daily.  Patient taking differently: Inject 25 Units into the skin 2 (two) times daily.   Shirline Frees, NP Taking Expired 09/24/23 2359   Insulin Pen Needle (B-D ULTRAFINE III SHORT PEN) 31G X 8 MM MISC [id number]  Use to inject insulin 4 times per day as instructed Nafziger, Kandee Keen, NP  Active   lactulose (CHRONULAC) 10 GM/15ML solution [id number] No Take by mouth. [provider] Taking Active   Lancets Letta Pate [place] PLUS [place]) MISC [id number] No Apply topically. [provider] Taking Active Self  losartan (COZAAR) 100 MG tablet [id number] No Take 1 tablet (100 mg total) by mouth every morning. Shirline Frees, NP Taking Active   Methoxy PEG-Epoetin Beta (MIRCERA IJ) [id number] No Inject into the skin. [provider] Taking Active   ondansetron (ZOFRAN-ODT) 4 MG disintegrating tablet [id number] No Take 1 tablet (4 mg total) by mouth every 8 (eight) hours as needed for nausea or vomiting. Durwin Glaze, MD Taking Active   [facility] ULTRA test strip [id number] No USE TO check blood glucose UP TO four times daily AS DIRECTED [provider] Taking Active Self   oxyCODONE-acetaminophen (PERCOCET) 5-325 MG tablet [id number] No Take 1 tablet by mouth every 6 (six) hours as needed for severe pain. Graceann Congress, PA-C Taking Active   oxyCODONE-acetaminophen (PERCOCET/ROXICET) 5-325 MG tablet [id number] No Take 1 tablet by mouth every 6 (six) hours as needed for severe pain (pain score 7-10).  Patient not taking: Reported on 10/02/2023   Durwin Glaze, MD Not Taking Active   tiZANidine (ZANAFLEX) 2 MG tablet [id number] No TAKE ONE TABLET BY MOUTH EVERYDAY AT BEDTIME Nafziger, Kandee Keen, NP Taking Active               Assessment/Plan:   Diabetes: - Currently uncontrolled - Reviewed long term cardiovascular and renal outcomes of uncontrolled blood sugar - Reviewed goal A1c, goal fasting, and goal 2 hour post prandial glucose - Stressed the importance of adherence to his insulin regimen. Counseled that we cannot properly adjust his meds and obtain BG control  without being fully aware of how he is using his medications. Discussed holding parameters for the Novolog. Continue lantus 25 units BID, Novolog 8 units once daily with meal, Glipizide 5mg  BID.    Follow Up Plan: 1 week  Sherrill Raring, PharmD Clinical Pharmacist [phone]

## 2023-10-17 DIAGNOSIS — I639 Cerebral infarction, unspecified: Secondary | ICD-10-CM | POA: Diagnosis not present

## 2023-10-17 DIAGNOSIS — M47816 Spondylosis without myelopathy or radiculopathy, lumbar region: Secondary | ICD-10-CM | POA: Diagnosis not present

## 2023-10-23 ENCOUNTER — Other Ambulatory Visit: Payer: PPO

## 2023-10-23 ENCOUNTER — Ambulatory Visit: Payer: PPO | Admitting: Adult Health

## 2023-10-23 DIAGNOSIS — E1140 Type 2 diabetes mellitus with diabetic neuropathy, unspecified: Secondary | ICD-10-CM

## 2023-10-23 NOTE — Progress Notes (Signed)
   10/23/2023  Patient ID: Russell Austin, male   DOB: May 25, 1951, 73 y.o.   MRN: [id number]  Contacted patient via telephone to follow up on diabetes management.  A review of patient's freestyle libre log shows consistently elevated sugars in the upper 300s-400 range. Patient states he did not feel good the last week, did not hardly eat and therefore did not give himself any insulin.  Patient states he had a stomachache that has now improved. Counseled on importance of adherence to insulin regimen and regular food intake as a diabetic.   Follow Up: 2 weeks but patient instructed to call sooner if any concerns  Sherrill Raring, PharmD Clinical Pharmacist [phone]

## 2023-10-28 DIAGNOSIS — I639 Cerebral infarction, unspecified: Secondary | ICD-10-CM | POA: Diagnosis not present

## 2023-10-28 DIAGNOSIS — M47816 Spondylosis without myelopathy or radiculopathy, lumbar region: Secondary | ICD-10-CM | POA: Diagnosis not present

## 2023-10-29 ENCOUNTER — Ambulatory Visit (INDEPENDENT_AMBULATORY_CARE_PROVIDER_SITE_OTHER): Payer: PPO | Admitting: Adult Health

## 2023-10-29 VITALS — BP 160/60 | HR 97 | Temp 98.7°F | Ht 69.0 in | Wt 175.0 lb

## 2023-10-29 DIAGNOSIS — K2100 Gastro-esophageal reflux disease with esophagitis, without bleeding: Secondary | ICD-10-CM | POA: Diagnosis not present

## 2023-10-29 DIAGNOSIS — R066 Hiccough: Secondary | ICD-10-CM

## 2023-10-29 MED ORDER — PANTOPRAZOLE SODIUM 40 MG PO TBEC: 40.0000 mg | DELAYED_RELEASE_TABLET | Freq: Every day | ORAL | 3 refills | Status: DC

## 2023-10-29 MED ORDER — CHLORPROMAZINE HCL 25 MG PO TABS: 25.0000 mg | ORAL_TABLET | Freq: Three times a day (TID) | ORAL | 0 refills | Status: DC

## 2023-10-29 NOTE — Progress Notes (Signed)
 Subjective:    Patient ID: Russell Austin, male    DOB: February 20, 1951, 73 y.o.   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  He is being evaluated today for 2 separate issues.  He has had issues with intractable hiccups in the past, was seen in the ER in September 2024 and prescribed Thorazine which worked well for him.  He reports that over the last 3 to 4 weeks he has been having hiccups daily throughout the day.  Hiccups can last for quite a while.  Furthermore over the last couple weeks he has been experiencing worsening GERD like symptoms that is only apparent when he is laying down in the evening.  Burning sensation will wake him up in the middle the night.  At home he has been using Tums with help alleviate some of the pain for short period of time.  He has had vomiting but is not nauseous on a constant basis.  Denies diarrhea.  Has not noticed any blood in his stool or vomit.   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   Chronic kidney disease    Diabetes mellitus type II    Diabetic retinopathy FOLLOWED BY DR Luciana Axe   ED (erectile dysfunction)    Glaucoma of both eyes    Hypertension    Left hydrocele    Stroke [facility])     Social History   Socioeconomic History   Marital status: Married    Spouse name: Not on file   Number of children: 1   Years of education: 12   Highest education level: 12th grade  Occupational History   Occupation: Disabled  Tobacco Use   Smoking status: Never   Smokeless tobacco: Never  Vaping Use   Vaping status: Never Used  Substance and Sexual Activity    Alcohol use: No   Drug use: No   Sexual activity: Not on file  Other Topics Concern   Not on file  Social History Narrative   Lives at home with his wife and granddaughter.   Left-handed.   3 cups caffeine per day.   Social Drivers of Corporate investment banker Strain: Low Risk  (01/21/2023)   Overall Financial Resource Strain (CARDIA)    Difficulty of Paying Living Expenses: Not hard at all  Food Insecurity: No Food Insecurity (01/21/2023)   Hunger Vital Sign    Worried About Running Out of Food in the Last Year: Never true    Ran Out of Food in the Last Year: Never true  Transportation Needs: No Transportation Needs (01/21/2023)   PRAPARE - Administrator, Civil Service (Medical): No    Lack of Transportation (Non-Medical): No  Physical Activity: Inactive (01/21/2023)   Exercise Vital Sign    Days of Exercise per Week: 0 days    Minutes of Exercise per Session: 0 min  Stress: No Stress Concern Present (01/21/2023)   [employer] of Occupational Health - Occupational Stress Questionnaire    Feeling of Stress : Not at all  Social Connections: Socially Integrated (01/21/2023)   Social Connection and Isolation Panel [NHANES]  Frequency of Communication with Friends and Family: More than three times a week    Frequency of Social Gatherings with Friends and Family: More than three times a week    Attends Religious Services: More than 4 times per year    Active Member of Clubs or Organizations: Yes    Attends Banker Meetings: More than 4 times per year    Marital Status: Married  Catering manager Violence: Not At Risk (01/21/2023)   Humiliation, Afraid, Rape, and Kick questionnaire    Fear of Current or Ex-Partner: No    Emotionally Abused: No    Physically Abused: No    Sexually Abused: No    Past Surgical History:  Procedure Laterality Date   APPENDECTOMY  AGE EARLY 20'S   CAPD INSERTION N/A 04/11/2023   Procedure: LAPAROSCOPIC INSERTION  CONTINUOUS AMBULATORY PERITONEAL DIALYSIS  (CAPD) CATHETER WITH  OMENTOPEXY;  Surgeon: Leonie Douglas, MD;  Location: [facility] OR;  Service: Vascular;  Laterality: N/A;   COLONOSCOPY  12/30/2020   every 5 years   HYDROCELE EXCISION  03/31/2012   Procedure: HYDROCELECTOMY ADULT;  Surgeon: Marcine Matar, MD;  Location: [facility];  Service: Urology;  Laterality: Left;  45 MINS     LEFT EYE LASER RETINA REPAIR  SEPT 2012   RIGHT EYE VITRECTOMY/ INSERTION GLAUCOMA SETON/ LASER REPAIR  12-13-2008   RETINAL ARTERY OCCLUSION /NEOVASCULAR GLAUCOMA/ HEMORRHAGE   RIGHT EYE VITRETOMY/ INSERTION GLAUCOMA SETON X2/ LASER  03-24-2009   RECURRENT HEMORRHAGE/ OCCLUSION INTERNAL SETON   SHOULDER ARTHROSCOPY Right 2005   undescended right testicle removed  1994    Family History  Problem Relation Age of Onset   Glaucoma Mother    Diabetes Mother    Stomach cancer Maternal Grandfather    Stroke Maternal Grandmother     Allergies  Allergen Reactions   Lactose Intolerance (Gi) Diarrhea   Apple Pectin [Pectin] Itching    ITCHY THROAT   Lactose Other (See Comments)   Metformin And Related     D/t decreased kidney function    Peach [Prunus Persica] Itching    ITCHY THROAT   Morphine And Codeine Anxiety    Jittery    Current Outpatient Medications on File Prior to Visit  Medication Sig Dispense Refill   amLODipine (NORVASC) 5 MG tablet Take 1 tablet (5 mg total) by mouth daily. 90 tablet 3   aspirin EC 81 MG tablet Take 81 mg by mouth daily.     atorvastatin (LIPITOR) 10 MG tablet Take 1 tablet (10 mg total) by mouth every morning. 90 tablet 3   blood glucose meter kit and supplies KIT Dispense based on patient and insurance preference. Use up to four times daily as directed. 1 each 0   buPROPion (WELLBUTRIN XL) 300 MG 24 hr tablet Take 1 tablet (300 mg total) by mouth every morning. 90 tablet 1   calcitRIOL (ROCALTROL) 0.25 MCG capsule Take 0.25 mcg by mouth daily.     cephALEXin  (KEFLEX) 500 MG capsule Take 1 capsule (500 mg total) by mouth 4 (four) times daily. 28 capsule 0   chlorthalidone (HYGROTON) 25 MG tablet Take 25 mg by mouth daily.     Continuous Blood Gluc Receiver (FREESTYLE LIBRE 14 DAY READER) DEVI Use with freestyle libre system 1 each 0   Continuous Glucose Sensor (FREESTYLE LIBRE 3 PLUS SENSOR) MISC Change sensor every 15 days. 6 each 3   Finerenone (KERENDIA) 10 MG TABS Take 10 mg by mouth daily.  gabapentin (NEURONTIN) 300 MG capsule TAKE ONE CAPSULE BY MOUTH EVERYDAY AT BEDTIME 90 capsule 3   gentamicin cream (GARAMYCIN) 0.1 % Apply topically daily.     insulin aspart (NOVOLOG FLEXPEN) 100 UNIT/ML FlexPen Inject 5 Units into the skin 3 (three) times daily with meals. (Patient taking differently: Inject 8 Units into the skin 2 (two) times daily before lunch and supper.) 15 mL 1   Insulin Pen Needle (B-D ULTRAFINE III SHORT PEN) 31G X 8 MM MISC Use to inject insulin 4 times per day as instructed 100 each 11   lactulose (CHRONULAC) 10 GM/15ML solution Take by mouth.     Lancets (ONETOUCH DELICA PLUS LANCET33G) MISC Apply topically.     losartan (COZAAR) 100 MG tablet Take 1 tablet (100 mg total) by mouth every morning. 90 tablet 1   Methoxy PEG-Epoetin Beta (MIRCERA IJ) Inject into the skin.     metoprolol tartrate (LOPRESSOR) 50 MG tablet Take 50 mg by mouth 2 (two) times daily.     ondansetron (ZOFRAN-ODT) 4 MG disintegrating tablet Take 1 tablet (4 mg total) by mouth every 8 (eight) hours as needed for nausea or vomiting. 20 tablet 0   ONETOUCH ULTRA test strip USE TO check blood glucose UP TO four times daily AS DIRECTED     oxyCODONE-acetaminophen (PERCOCET) 5-325 MG tablet Take 1 tablet by mouth every 6 (six) hours as needed for severe pain. 20 tablet 0   oxyCODONE-acetaminophen (PERCOCET/ROXICET) 5-325 MG tablet Take 1 tablet by mouth every 6 (six) hours as needed for severe pain (pain score 7-10). 15 tablet 0   potassium chloride (KLOR-CON) 10  MEQ tablet Take by mouth.     tiZANidine (ZANAFLEX) 2 MG tablet TAKE ONE TABLET BY MOUTH EVERYDAY AT BEDTIME 90 tablet 3   glipiZIDE (GLUCOTROL XL) 5 MG 24 hr tablet Take 1 tablet (5 mg total) by mouth 2 (two) times daily. 180 tablet 1   insulin glargine (LANTUS SOLOSTAR) 100 UNIT/ML Solostar Pen Inject 20 Units into the skin 2 (two) times daily. (Patient taking differently: Inject 25 Units into the skin 2 (two) times daily.) 36 mL 0   No current facility-administered medications on file prior to visit.    BP (!) 160/100   Pulse 97   Temp 98.7 F (37.1 C) (Oral)   Ht 5\' 9"  (1.753 m)   Wt 175 lb (79.4 kg)   SpO2 (!) 10%   BMI 25.84 kg/m       Objective:   Physical Exam Vitals and nursing note reviewed.  Constitutional:      Appearance: Normal appearance.  Cardiovascular:     Rate and Rhythm: Normal rate and regular rhythm.     Pulses: Normal pulses.     Heart sounds: Normal heart sounds.  Pulmonary:     Effort: Pulmonary effort is normal.     Breath sounds: Normal breath sounds.  Musculoskeletal:        General: Normal range of motion.  Skin:    General: Skin is warm and dry.  Neurological:     General: No focal deficit present.     Mental Status: He is alert and oriented to person, place, and time.  Psychiatric:        Mood and Affect: Mood normal.        Behavior: Behavior normal.        Thought Content: Thought content normal.        Judgment: Judgment normal.  Assessment & Plan:  1. Intractable hiccups (Primary) - Will send in Thorazine. Advised to take TID x 5 days and then can use PRN  - chlorproMAZINE (THORAZINE) 25 MG tablet; Take 1 tablet (25 mg total) by mouth 3 (three) times daily.  Dispense: 90 tablet; Refill: 0  2. Gastroesophageal reflux disease with esophagitis without hemorrhage  - pantoprazole (PROTONIX) 40 MG tablet; Take 1 tablet (40 mg total) by mouth daily.  Dispense: 30 tablet; Refill: 3  Shirline Frees, NP  Time spent with patient  today was 31 minutes which consisted of chart review, discussing hiccups and GERD, work up, treatment, listening, answering questions and documentation.

## 2023-11-01 DIAGNOSIS — E1122 Type 2 diabetes mellitus with diabetic chronic kidney disease: Secondary | ICD-10-CM | POA: Diagnosis not present

## 2023-11-01 DIAGNOSIS — N186 End stage renal disease: Secondary | ICD-10-CM | POA: Diagnosis not present

## 2023-11-01 DIAGNOSIS — Z992 Dependence on renal dialysis: Secondary | ICD-10-CM | POA: Diagnosis not present

## 2023-11-02 DIAGNOSIS — D509 Iron deficiency anemia, unspecified: Secondary | ICD-10-CM | POA: Diagnosis not present

## 2023-11-02 DIAGNOSIS — Z4932 Encounter for adequacy testing for peritoneal dialysis: Secondary | ICD-10-CM | POA: Diagnosis not present

## 2023-11-02 DIAGNOSIS — K769 Liver disease, unspecified: Secondary | ICD-10-CM | POA: Diagnosis not present

## 2023-11-02 DIAGNOSIS — E440 Moderate protein-calorie malnutrition: Secondary | ICD-10-CM | POA: Diagnosis not present

## 2023-11-02 DIAGNOSIS — N186 End stage renal disease: Secondary | ICD-10-CM | POA: Diagnosis not present

## 2023-11-02 DIAGNOSIS — N2581 Secondary hyperparathyroidism of renal origin: Secondary | ICD-10-CM | POA: Diagnosis not present

## 2023-11-02 DIAGNOSIS — Z992 Dependence on renal dialysis: Secondary | ICD-10-CM | POA: Diagnosis not present

## 2023-11-02 DIAGNOSIS — K659 Peritonitis, unspecified: Secondary | ICD-10-CM | POA: Diagnosis not present

## 2023-11-02 DIAGNOSIS — R109 Unspecified abdominal pain: Secondary | ICD-10-CM | POA: Diagnosis not present

## 2023-11-02 DIAGNOSIS — D631 Anemia in chronic kidney disease: Secondary | ICD-10-CM | POA: Diagnosis not present

## 2023-11-05 ENCOUNTER — Ambulatory Visit: Payer: PPO

## 2023-11-05 DIAGNOSIS — Z794 Long term (current) use of insulin: Secondary | ICD-10-CM

## 2023-11-05 DIAGNOSIS — M2170 Unequal limb length (acquired), unspecified site: Secondary | ICD-10-CM

## 2023-11-05 DIAGNOSIS — I639 Cerebral infarction, unspecified: Secondary | ICD-10-CM | POA: Diagnosis not present

## 2023-11-05 DIAGNOSIS — M2041 Other hammer toe(s) (acquired), right foot: Secondary | ICD-10-CM

## 2023-11-05 DIAGNOSIS — E1140 Type 2 diabetes mellitus with diabetic neuropathy, unspecified: Secondary | ICD-10-CM

## 2023-11-05 DIAGNOSIS — M2141 Flat foot [pes planus] (acquired), right foot: Secondary | ICD-10-CM

## 2023-11-05 DIAGNOSIS — M2142 Flat foot [pes planus] (acquired), left foot: Secondary | ICD-10-CM

## 2023-11-05 DIAGNOSIS — M47816 Spondylosis without myelopathy or radiculopathy, lumbar region: Secondary | ICD-10-CM | POA: Diagnosis not present

## 2023-11-05 DIAGNOSIS — L84 Corns and callosities: Secondary | ICD-10-CM

## 2023-11-05 DIAGNOSIS — M21611 Bunion of right foot: Secondary | ICD-10-CM

## 2023-11-05 NOTE — Progress Notes (Signed)
 Patient presents to the office today for diabetic shoe and insole measuring.  Patient was measured with brannock device to determine size and width for 1pr shoes 2pr inserts / Right shoe rocker with lift 3/4"   Documentation of medical necessity will be sent to patient's treating diabetic doctor to verify and sign.   Patient's diabetic provider: Gershon Crane oversees  Nafziger, Kandee Keen, NP   Shoes and insoles will be ordered at that time and patient will be notified for an appointment for fitting when they arrive. Brannock measurement: 9WD Shoe choice:   [id number] brooks velcro / [id number] Shoe size ordered: 9WD Ppw/ ABN signed  Rough fitting needed and then will need to send shoe to [employer] for right rocker / Lift

## 2023-11-06 ENCOUNTER — Other Ambulatory Visit: Payer: PPO

## 2023-11-06 DIAGNOSIS — E1140 Type 2 diabetes mellitus with diabetic neuropathy, unspecified: Secondary | ICD-10-CM

## 2023-11-06 NOTE — Progress Notes (Signed)
 11/06/2023 Name: Russell Austin MRN: [id number] DOB: [birth year]  Chief Complaint  Patient presents with   Diabetes   Medication Management    Russell Austin is a 73 y.o. year old male who presented for a telephone visit.   They were referred to the pharmacist by their PCP for assistance in managing diabetes and complex medication management.    Subjective:  Care Team: Primary Care Provider: Shirline Frees, NP ; Next Scheduled Visit: 11/28/23  Medication Access/Adherence  Current Pharmacy:  [id number] - [place], [place] - [address] [place] [address] Phone: [phone] Fax: [phone]   Patient reports affordability concerns with their medications: No  Patient reports access/transportation concerns to their pharmacy: No  Patient reports adherence concerns with their medications:  YES - see DM section  Diabetes:  Current Medications: Lantus 20 units BID, Novolog 8 units with dinner, Glipizide 5mg  BID    No data from the last 3 days because patient's sensor has expired. He will change today. Admits to having a hard time opening sensor applicator device.  Patient admits to skipping insulin on a regular basis because he "goes low when he gives it to himself." Is very nonadherent to his regimen   Objective:  Lab Results  Component Value Date   HGBA1C 9.5 (A) 08/29/2023    Lab Results  Component Value Date   CREATININE 3.09 (H) 09/16/2023   BUN 39 (H) 09/16/2023   NA 134 (L) 09/16/2023   K 3.6 09/16/2023   CL 97 (L) 09/16/2023   CO2 27 09/16/2023    Lab Results  Component Value Date   CHOL 90 02/28/2023   HDL 26.30 (L) 02/28/2023   LDLCALC 46 02/28/2023   TRIG 88.0 02/28/2023   CHOLHDL 3 02/28/2023    Medications Reviewed Today     Reviewed by Sherrill Raring, RPH (Pharmacist) on 11/06/23 at 1322  Med List Status: <None>   Medication Order Taking? Sig Documenting Provider Last Dose Status Informant   amLODipine (NORVASC) 5 MG tablet [id number] No Take 1 tablet (5 mg total) by mouth daily. Nafziger, Kandee Keen, NP Taking Active   aspirin EC 81 MG tablet [id number] No Take 81 mg by mouth daily. [provider] Taking Active Self  atorvastatin (LIPITOR) 10 MG tablet [id number] No Take 1 tablet (10 mg total) by mouth every morning. Nafziger, Kandee Keen, NP Taking Active   blood glucose meter kit and supplies KIT [id number] No Dispense based on patient and insurance preference. Use up to four times daily as directed. Nafziger, Kandee Keen, NP Taking Active Self  buPROPion (WELLBUTRIN XL) 300 MG 24 hr tablet [id number] No Take 1 tablet (300 mg total) by mouth every morning. Nafziger, Kandee Keen, NP Taking Active   calcitRIOL (ROCALTROL) 0.25 MCG capsule [id number] No Take 0.25 mcg by mouth daily. [provider] Taking Active Self  chlorproMAZINE (THORAZINE) 25 MG tablet [id number]  Take 1 tablet (25 mg total) by mouth 3 (three) times daily. Nafziger, Kandee Keen, NP  Active   chlorthalidone (HYGROTON) 25 MG tablet [id number] No Take 25 mg by mouth daily. [provider] Taking Active Self  Continuous Blood Gluc Receiver (FREESTYLE LIBRE 14 DAY READER) DEVI [id number] No Use with freestyle libre system Nafziger, [place], NP Taking Active Self  Continuous Glucose Sensor (FREESTYLE LIBRE 3 PLUS SENSOR) MISC [id number] No Change sensor every 15 days. Nafziger, Kandee Keen, NP Taking Active   Finerenone (KERENDIA) 10 MG TABS [id number] No Take 10 mg by mouth daily. [provider]  Taking Active Self  gabapentin (NEURONTIN) 300 MG capsule [id number] No TAKE ONE CAPSULE BY MOUTH EVERYDAY AT BEDTIME Nafziger, Kandee Keen, NP Taking Active   gentamicin cream (GARAMYCIN) 0.1 % [id number] No Apply topically daily. [provider] Taking Active   glipiZIDE (GLUCOTROL XL) 5 MG 24 hr tablet [id number] No Take 1 tablet (5 mg total) by mouth 2 (two) times daily. Nafziger, Kandee Keen, NP Taking Expired 08/28/23 2359   insulin aspart (NOVOLOG  FLEXPEN) 100 UNIT/ML FlexPen [id number] No Inject 5 Units into the skin 3 (three) times daily with meals.  Patient taking differently: Inject 8 Units into the skin 2 (two) times daily before lunch and supper.   Nafziger, Kandee Keen, NP Taking Active            Med Note Leitha Bleak, Milas Kocher   Wed Oct 16, 2023 11:45 AM) Still only using once daily   insulin glargine (LANTUS SOLOSTAR) 100 UNIT/ML Solostar Pen [id number] No Inject 20 Units into the skin 2 (two) times daily.  Patient taking differently: Inject 25 Units into the skin 2 (two) times daily.   Shirline Frees, NP Taking Expired 09/24/23 2359   Insulin Pen Needle (B-D ULTRAFINE III SHORT PEN) 31G X 8 MM MISC [id number] No Use to inject insulin 4 times per day as instructed Nafziger, Kandee Keen, NP Taking Active   lactulose (CHRONULAC) 10 GM/15ML solution [id number] No Take by mouth. [provider] Taking Active   Lancets Letta Pate [place] PLUS [place]) MISC [id number] No Apply topically. [provider] Taking Active Self  losartan (COZAAR) 100 MG tablet [id number] No Take 1 tablet (100 mg total) by mouth every morning. Shirline Frees, NP Taking Active   Methoxy PEG-Epoetin Beta (MIRCERA IJ) [id number] No Inject into the skin. [provider] Taking Active   metoprolol tartrate (LOPRESSOR) 50 MG tablet [id number] No Take 50 mg by mouth 2 (two) times daily. [provider] Taking Active   ondansetron (ZOFRAN-ODT) 4 MG disintegrating tablet [id number] No Take 1 tablet (4 mg total) by mouth every 8 (eight) hours as needed for nausea or vomiting. Durwin Glaze, MD Taking Active   [facility] ULTRA test strip [id number] No USE TO check blood glucose UP TO four times daily AS DIRECTED [provider] Taking Active Self  pantoprazole (PROTONIX) 40 MG tablet [id number]  Take 1 tablet (40 mg total) by mouth daily. Nafziger, Kandee Keen, NP  Active   potassium chloride (KLOR-CON) 10 MEQ tablet [id number] No Take by mouth. [provider] Taking Active   tiZANidine (ZANAFLEX) 2 MG tablet [id number] No TAKE ONE TABLET BY MOUTH EVERYDAY AT BEDTIME Nafziger, Kandee Keen, NP Taking Active               Assessment/Plan:   Diabetes: - Currently uncontrolled - Reviewed long term cardiovascular and renal outcomes of uncontrolled blood sugar - Reviewed goal A1c, goal fasting, and goal 2 hour post prandial glucose - Stressed the importance of adherence to his insulin regimen. Counseled that we cannot properly adjust his meds and obtain BG control without being fully aware of how he is using his medications.  -DECREASE Lantus to 15 units twice daily as patient is having lows in 40s when taking as prescribed. Continue Novolog 5 units with dinner and glipizide 5mg  BID. Counseled patient at length about poor outcomes of non-adherence and uncontrolled DM. Patient voiced understanding.     Follow Up Plan: 1 week  Sherrill Raring, PharmD Clinical Pharmacist [phone]

## 2023-11-13 ENCOUNTER — Other Ambulatory Visit

## 2023-11-13 ENCOUNTER — Telehealth: Payer: Self-pay

## 2023-11-13 NOTE — Telephone Encounter (Signed)
 Left VM to schedule diabetic shoe pick up

## 2023-11-17 ENCOUNTER — Other Ambulatory Visit: Payer: Self-pay | Admitting: Adult Health

## 2023-11-17 DIAGNOSIS — E119 Type 2 diabetes mellitus without complications: Secondary | ICD-10-CM

## 2023-11-21 ENCOUNTER — Other Ambulatory Visit: Payer: Self-pay | Admitting: Adult Health

## 2023-11-21 DIAGNOSIS — R066 Hiccough: Secondary | ICD-10-CM

## 2023-11-22 ENCOUNTER — Telehealth: Payer: Self-pay

## 2023-11-22 ENCOUNTER — Other Ambulatory Visit: Payer: Self-pay | Admitting: Adult Health

## 2023-11-22 DIAGNOSIS — F339 Major depressive disorder, recurrent, unspecified: Secondary | ICD-10-CM

## 2023-11-22 NOTE — Telephone Encounter (Signed)
 Left vm to schedule diabetic shoe pick up

## 2023-11-26 ENCOUNTER — Telehealth: Payer: Self-pay

## 2023-11-26 NOTE — Telephone Encounter (Signed)
 Porfirio Oar AUTH # [id number]  VALID THRU 11/13/23-02/11/24

## 2023-11-28 ENCOUNTER — Ambulatory Visit: Payer: PPO | Admitting: Adult Health

## 2023-11-28 NOTE — Progress Notes (Deleted)
 Subjective:    Patient ID: Russell Austin, male    DOB: 1951-07-01, 73 y.o.   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  He presents to the office today for three month follow up regarding DM and HTN   DM II -he is currently managed with Lantus 15 units twice daily, Novolog 5 units with dinner ( he refuses to inject Novolog more than once )and glipizide 5 mg BID.  Lab Results  Component Value Date   HGBA1C 9.5 (A) 08/29/2023   HGBA1C 10.2 (A) 05/30/2023   HGBA1C 8.1 (H) 02/28/2023   CONTINUOUS GLUCOSE MONITORING RECORD INTERPRETATION                 Dates of Recording:    Sensor description: freestyle libre    Results statistics:   CGM use % of time   Average and SD 292  Time in range 7%  % Time Above 180 19%  % Time above 250 74%  % Time Below target 0%     Hypertension-managed with Norvasc 5 mg daily, losartan 100 mg daily, and chlorthalidone 12.5 mg daily,   He denies dizziness, lightheadedness, chest pain, shortness of breath, or syncopal episodes. 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   Chronic kidney disease    Diabetes mellitus type II    Diabetic retinopathy FOLLOWED BY DR Luciana Axe   ED (erectile dysfunction)    Glaucoma of both eyes    Hypertension    Left hydrocele    Stroke [facility])     Social History   Socioeconomic History   Marital status: Married    Spouse name: Not on file    Number of children: 1   Years of education: 12   Highest education level: 12th grade  Occupational History   Occupation: Disabled  Tobacco Use   Smoking status: Never   Smokeless tobacco: Never  Vaping Use   Vaping status: Never Used  Substance and Sexual Activity   Alcohol use: No   Drug use: No   Sexual activity: Not on file  Other Topics Concern   Not on file  Social History Narrative   Lives at home with his wife and granddaughter.   Left-handed.   3 cups caffeine per day.   Social Drivers of Corporate investment banker Strain: Low Risk  (01/21/2023)   Overall Financial Resource Strain (CARDIA)    Difficulty of Paying Living Expenses: Not hard at all  Food Insecurity: No Food Insecurity (01/21/2023)   Hunger Vital Sign    Worried About Running Out of Food in the Last Year: Never true    Ran Out of Food in the Last Year: Never true  Transportation Needs: No Transportation Needs (01/21/2023)   PRAPARE - Administrator, Civil Service (Medical): No    Lack of Transportation (Non-Medical): No  Physical Activity: Inactive (01/21/2023)   Exercise Vital Sign  Days of Exercise per Week: 0 days    Minutes of Exercise per Session: 0 min  Stress: No Stress Concern Present (01/21/2023)   [employer] of Occupational Health - Occupational Stress Questionnaire    Feeling of Stress : Not at all  Social Connections: Socially Integrated (01/21/2023)   Social Connection and Isolation Panel [NHANES]    Frequency of Communication with Friends and Family: More than three times a week    Frequency of Social Gatherings with Friends and Family: More than three times a week    Attends Religious Services: More than 4 times per year    Active Member of Clubs or Organizations: Yes    Attends Banker Meetings: More than 4 times per year    Marital Status: Married  Catering manager Violence: Not At Risk (01/21/2023)   Humiliation, Afraid, Rape, and Kick questionnaire     Fear of Current or Ex-Partner: No    Emotionally Abused: No    Physically Abused: No    Sexually Abused: No    Past Surgical History:  Procedure Laterality Date   APPENDECTOMY  AGE EARLY 20'S   CAPD INSERTION N/A 04/11/2023   Procedure: LAPAROSCOPIC INSERTION CONTINUOUS AMBULATORY PERITONEAL DIALYSIS  (CAPD) CATHETER WITH  OMENTOPEXY;  Surgeon: Leonie Douglas, MD;  Location: [facility] OR;  Service: Vascular;  Laterality: N/A;   COLONOSCOPY  12/30/2020   every 5 years   HYDROCELE EXCISION  03/31/2012   Procedure: HYDROCELECTOMY ADULT;  Surgeon: Marcine Matar, MD;  Location: [facility];  Service: Urology;  Laterality: Left;  45 MINS     LEFT EYE LASER RETINA REPAIR  SEPT 2012   RIGHT EYE VITRECTOMY/ INSERTION GLAUCOMA SETON/ LASER REPAIR  12-13-2008   RETINAL ARTERY OCCLUSION /NEOVASCULAR GLAUCOMA/ HEMORRHAGE   RIGHT EYE VITRETOMY/ INSERTION GLAUCOMA SETON X2/ LASER  03-24-2009   RECURRENT HEMORRHAGE/ OCCLUSION INTERNAL SETON   SHOULDER ARTHROSCOPY Right 2005   undescended right testicle removed  1994    Family History  Problem Relation Age of Onset   Glaucoma Mother    Diabetes Mother    Stomach cancer Maternal Grandfather    Stroke Maternal Grandmother     Allergies  Allergen Reactions   Lactose Intolerance (Gi) Diarrhea   Apple Pectin [Pectin] Itching    ITCHY THROAT   Lactose Other (See Comments)   Metformin And Related     D/t decreased kidney function    Peach [Prunus Persica] Itching    ITCHY THROAT   Morphine And Codeine Anxiety    Jittery    Current Outpatient Medications on File Prior to Visit  Medication Sig Dispense Refill   amLODipine (NORVASC) 5 MG tablet Take 1 tablet (5 mg total) by mouth daily. 90 tablet 3   aspirin EC 81 MG tablet Take 81 mg by mouth daily.     atorvastatin (LIPITOR) 10 MG tablet Take 1 tablet (10 mg total) by mouth every morning. 90 tablet 3   blood glucose meter kit and supplies KIT Dispense based on patient and  insurance preference. Use up to four times daily as directed. 1 each 0   buPROPion (WELLBUTRIN XL) 300 MG 24 hr tablet TAKE 1 TABLET (300 MG TOTAL) BY MOUTH EVERY MORNING. 90 tablet 1   calcitRIOL (ROCALTROL) 0.25 MCG capsule Take 0.25 mcg by mouth daily.     chlorproMAZINE (THORAZINE) 25 MG tablet TAKE 1 TABLET BY MOUTH THREE TIMES A DAY 270 tablet 1   chlorthalidone (HYGROTON) 25 MG tablet Take  25 mg by mouth daily.     Continuous Blood Gluc Receiver (FREESTYLE LIBRE 14 DAY READER) DEVI Use with freestyle libre system 1 each 0   Continuous Glucose Sensor (FREESTYLE LIBRE 3 PLUS SENSOR) MISC Change sensor every 15 days. 6 each 3   Finerenone (KERENDIA) 10 MG TABS Take 10 mg by mouth daily.     gabapentin (NEURONTIN) 300 MG capsule TAKE ONE CAPSULE BY MOUTH EVERYDAY AT BEDTIME 90 capsule 3   gentamicin cream (GARAMYCIN) 0.1 % Apply topically daily.     glipiZIDE (GLUCOTROL XL) 5 MG 24 hr tablet TAKE 1 TABLET BY MOUTH TWICE A DAY 180 tablet 1   insulin aspart (NOVOLOG FLEXPEN) 100 UNIT/ML FlexPen Inject 5 Units into the skin 3 (three) times daily with meals. (Patient taking differently: Inject 8 Units into the skin 2 (two) times daily before lunch and supper.) 15 mL 1   insulin glargine (LANTUS SOLOSTAR) 100 UNIT/ML Solostar Pen Inject 20 Units into the skin 2 (two) times daily. (Patient taking differently: Inject 25 Units into the skin 2 (two) times daily.) 36 mL 0   Insulin Pen Needle (B-D ULTRAFINE III SHORT PEN) 31G X 8 MM MISC Use to inject insulin 4 times per day as instructed 100 each 11   lactulose (CHRONULAC) 10 GM/15ML solution Take by mouth.     Lancets (ONETOUCH DELICA PLUS LANCET33G) MISC Apply topically.     losartan (COZAAR) 100 MG tablet TAKE 1 TABLET BY MOUTH EVERY DAY IN THE MORNING 90 tablet 1   Methoxy PEG-Epoetin Beta (MIRCERA IJ) Inject into the skin.     metoprolol tartrate (LOPRESSOR) 50 MG tablet Take 50 mg by mouth 2 (two) times daily.     ondansetron (ZOFRAN-ODT) 4 MG  disintegrating tablet Take 1 tablet (4 mg total) by mouth every 8 (eight) hours as needed for nausea or vomiting. 20 tablet 0   ONETOUCH ULTRA test strip USE TO check blood glucose UP TO four times daily AS DIRECTED     pantoprazole (PROTONIX) 40 MG tablet Take 1 tablet (40 mg total) by mouth daily. 30 tablet 3   potassium chloride (KLOR-CON) 10 MEQ tablet Take by mouth.     tiZANidine (ZANAFLEX) 2 MG tablet TAKE ONE TABLET BY MOUTH EVERYDAY AT BEDTIME 90 tablet 3   No current facility-administered medications on file prior to visit.    There were no vitals taken for this visit.      Objective:   Physical Exam Vitals and nursing note reviewed.  Constitutional:      Appearance: Normal appearance. He is obese.  Cardiovascular:     Rate and Rhythm: Normal rate and regular rhythm.     Pulses: Normal pulses.     Heart sounds: Normal heart sounds.  Pulmonary:     Effort: Pulmonary effort is normal.     Breath sounds: Normal breath sounds.  Skin:    General: Skin is warm and dry.  Neurological:     General: No focal deficit present.     Mental Status: He is alert and oriented to person, place, and time.  Psychiatric:        Mood and Affect: Mood normal.        Behavior: Behavior normal.        Thought Content: Thought content normal.        Judgment: Judgment normal.           Assessment & Plan:

## 2023-11-29 ENCOUNTER — Telehealth: Payer: Self-pay

## 2023-11-29 NOTE — Progress Notes (Signed)
   11/29/2023  Patient ID: Margy Clarks, male   DOB: [birth year], 73 y.o.   MRN: [id number]  Attempted to contact patient for to reschedule missed appointment for medication management in relation to DM. Left HIPAA compliant message for patient to return my call at their convenience.   Sherrill Raring, PharmD Clinical Pharmacist [phone]

## 2023-12-02 DIAGNOSIS — N186 End stage renal disease: Secondary | ICD-10-CM | POA: Diagnosis not present

## 2023-12-02 DIAGNOSIS — E1122 Type 2 diabetes mellitus with diabetic chronic kidney disease: Secondary | ICD-10-CM | POA: Diagnosis not present

## 2023-12-02 DIAGNOSIS — Z992 Dependence on renal dialysis: Secondary | ICD-10-CM | POA: Diagnosis not present

## 2023-12-03 DIAGNOSIS — R82998 Other abnormal findings in urine: Secondary | ICD-10-CM | POA: Diagnosis not present

## 2023-12-03 DIAGNOSIS — N2581 Secondary hyperparathyroidism of renal origin: Secondary | ICD-10-CM | POA: Diagnosis not present

## 2023-12-03 DIAGNOSIS — Z992 Dependence on renal dialysis: Secondary | ICD-10-CM | POA: Diagnosis not present

## 2023-12-03 DIAGNOSIS — D631 Anemia in chronic kidney disease: Secondary | ICD-10-CM | POA: Diagnosis not present

## 2023-12-03 DIAGNOSIS — N2589 Other disorders resulting from impaired renal tubular function: Secondary | ICD-10-CM | POA: Diagnosis not present

## 2023-12-03 DIAGNOSIS — Z79899 Other long term (current) drug therapy: Secondary | ICD-10-CM | POA: Diagnosis not present

## 2023-12-03 DIAGNOSIS — R509 Fever, unspecified: Secondary | ICD-10-CM | POA: Diagnosis not present

## 2023-12-03 DIAGNOSIS — N186 End stage renal disease: Secondary | ICD-10-CM | POA: Diagnosis not present

## 2023-12-03 DIAGNOSIS — E440 Moderate protein-calorie malnutrition: Secondary | ICD-10-CM | POA: Diagnosis not present

## 2023-12-03 DIAGNOSIS — Z4932 Encounter for adequacy testing for peritoneal dialysis: Secondary | ICD-10-CM | POA: Diagnosis not present

## 2023-12-03 DIAGNOSIS — D509 Iron deficiency anemia, unspecified: Secondary | ICD-10-CM | POA: Diagnosis not present

## 2023-12-03 DIAGNOSIS — K769 Liver disease, unspecified: Secondary | ICD-10-CM | POA: Diagnosis not present

## 2023-12-05 ENCOUNTER — Other Ambulatory Visit: Payer: Self-pay | Admitting: Adult Health

## 2023-12-05 DIAGNOSIS — K2100 Gastro-esophageal reflux disease with esophagitis, without bleeding: Secondary | ICD-10-CM

## 2023-12-05 DIAGNOSIS — R112 Nausea with vomiting, unspecified: Secondary | ICD-10-CM

## 2023-12-05 DIAGNOSIS — R066 Hiccough: Secondary | ICD-10-CM

## 2023-12-05 DIAGNOSIS — Z794 Long term (current) use of insulin: Secondary | ICD-10-CM

## 2023-12-10 ENCOUNTER — Encounter: Payer: Self-pay | Admitting: Adult Health

## 2023-12-10 ENCOUNTER — Ambulatory Visit (INDEPENDENT_AMBULATORY_CARE_PROVIDER_SITE_OTHER): Admitting: Adult Health

## 2023-12-10 ENCOUNTER — Ambulatory Visit: Admitting: Family Medicine

## 2023-12-10 VITALS — BP 150/70

## 2023-12-10 DIAGNOSIS — M7989 Other specified soft tissue disorders: Secondary | ICD-10-CM | POA: Diagnosis not present

## 2023-12-10 DIAGNOSIS — Z794 Long term (current) use of insulin: Secondary | ICD-10-CM

## 2023-12-10 DIAGNOSIS — Z7984 Long term (current) use of oral hypoglycemic drugs: Secondary | ICD-10-CM

## 2023-12-10 DIAGNOSIS — E119 Type 2 diabetes mellitus without complications: Secondary | ICD-10-CM | POA: Diagnosis not present

## 2023-12-10 DIAGNOSIS — I10 Essential (primary) hypertension: Secondary | ICD-10-CM | POA: Diagnosis not present

## 2023-12-10 LAB — POCT GLYCOSYLATED HEMOGLOBIN (HGB A1C): Hemoglobin A1C: 6.8 % — AB (ref 4.0–5.6)

## 2023-12-10 NOTE — Progress Notes (Signed)
 Subjective:    Patient ID: Russell Austin, male    DOB: 07-03-1951, 73 y.o.   MRN: [id number]  HPI He presents to the office today for three month follow up regarding DM and HTN   DM II -he is currently managed with Lantus 20  units twice daily, Novolog 8 units with dinner ( he refuses to inject Novolog more than once )and glipizide 5 mg BID. He did not bring his libre reader today. He is now taking his medications and eating a full meal.  Lab Results  Component Value Date   HGBA1C 6.8 (A) 12/10/2023   HGBA1C 9.5 (A) 08/29/2023   HGBA1C 10.2 (A) 05/30/2023    Hypertension-managed with Norvasc 5 mg daily, losartan 100 mg daily, and chlorthalidone 12.5 mg daily,   He denies dizziness, lightheadedness, chest pain, shortness of breath, or syncopal episodes. He reports that his nephrologist dropped metoprolol and torsemide due to hypotension  BP Readings from Last 3 Encounters:  12/10/23 (!) 150/70  10/29/23 (!) 160/60  09/16/23 138/71   Right hand edema -  His wife reports that for the last two weeks his right hand has been swollen. The swelling does not go up and down and has stayed the same. Swelling does not radiate up his arm. He does not have any pain.  He is on peritoneal dialysis. He does have chronic lower extremity edema.   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   Chronic kidney disease    Diabetes mellitus type II    Diabetic retinopathy FOLLOWED BY DR Luciana Axe   ED (erectile dysfunction)    Glaucoma of both eyes    Hypertension    Left hydrocele    Stroke [facility])     Social History   Socioeconomic History   Marital status: Married    Spouse name: Not on file   Number of children: 1   Years of education: 12   Highest education level: 12th grade  Occupational History   Occupation: Disabled  Tobacco Use   Smoking status: Never   Smokeless tobacco: Never  Vaping  Use   Vaping status: Never Used  Substance and Sexual Activity   Alcohol use: No   Drug use: No   Sexual activity: Not on file  Other Topics Concern   Not on file  Social History Narrative   Lives at home with his wife and granddaughter.   Left-handed.   3 cups caffeine per day.   Social Drivers of Corporate investment banker Strain: Low Risk  (01/21/2023)   Overall Financial Resource Strain (CARDIA)    Difficulty of Paying Living Expenses: Not hard at all  Food Insecurity: No Food Insecurity (01/21/2023)   Hunger Vital Sign    Worried About Running Out of Food in the Last Year: Never true    Ran Out of Food in the Last Year: Never true  Transportation Needs: No Transportation Needs (01/21/2023)   PRAPARE - Administrator, Civil Service (Medical): No    Lack of Transportation (Non-Medical): No  Physical Activity: Inactive (01/21/2023)   Exercise Vital Sign    Days of Exercise per Week: 0 days    Minutes of Exercise per Session: 0 min  Stress: No Stress Concern Present (01/21/2023)   [employer] of Occupational Health - Occupational Stress Questionnaire    Feeling of Stress :  Not at all  Social Connections: Socially Integrated (01/21/2023)   Social Connection and Isolation Panel [NHANES]    Frequency of Communication with Friends and Family: More than three times a week    Frequency of Social Gatherings with Friends and Family: More than three times a week    Attends Religious Services: More than 4 times per year    Active Member of Clubs or Organizations: Yes    Attends Banker Meetings: More than 4 times per year    Marital Status: Married  Catering manager Violence: Not At Risk (01/21/2023)   Humiliation, Afraid, Rape, and Kick questionnaire    Fear of Current or Ex-Partner: No    Emotionally Abused: No    Physically Abused: No    Sexually Abused: No    Past Surgical History:  Procedure Laterality Date   APPENDECTOMY  AGE EARLY 20'S   CAPD  INSERTION N/A 04/11/2023   Procedure: LAPAROSCOPIC INSERTION CONTINUOUS AMBULATORY PERITONEAL DIALYSIS  (CAPD) CATHETER WITH  OMENTOPEXY;  Surgeon: Leonie Douglas, MD;  Location: [facility] OR;  Service: Vascular;  Laterality: N/A;   COLONOSCOPY  12/30/2020   every 5 years   HYDROCELE EXCISION  03/31/2012   Procedure: HYDROCELECTOMY ADULT;  Surgeon: Marcine Matar, MD;  Location: [facility];  Service: Urology;  Laterality: Left;  45 MINS     LEFT EYE LASER RETINA REPAIR  SEPT 2012   RIGHT EYE VITRECTOMY/ INSERTION GLAUCOMA SETON/ LASER REPAIR  12-13-2008   RETINAL ARTERY OCCLUSION /NEOVASCULAR GLAUCOMA/ HEMORRHAGE   RIGHT EYE VITRETOMY/ INSERTION GLAUCOMA SETON X2/ LASER  03-24-2009   RECURRENT HEMORRHAGE/ OCCLUSION INTERNAL SETON   SHOULDER ARTHROSCOPY Right 2005   undescended right testicle removed  1994    Family History  Problem Relation Age of Onset   Glaucoma Mother    Diabetes Mother    Stomach cancer Maternal Grandfather    Stroke Maternal Grandmother     Allergies  Allergen Reactions   Lactose Intolerance (Gi) Diarrhea   Apple Pectin [Pectin] Itching    ITCHY THROAT   Lactose Other (See Comments)   Metformin And Related     D/t decreased kidney function    Peach [Prunus Persica] Itching    ITCHY THROAT   Morphine And Codeine Anxiety    Jittery    Current Outpatient Medications on File Prior to Visit  Medication Sig Dispense Refill   amLODipine (NORVASC) 5 MG tablet Take 1 tablet (5 mg total) by mouth daily. 90 tablet 3   aspirin EC 81 MG tablet Take 81 mg by mouth daily.     atorvastatin (LIPITOR) 10 MG tablet Take 1 tablet (10 mg total) by mouth every morning. 90 tablet 3   blood glucose meter kit and supplies KIT Dispense based on patient and insurance preference. Use up to four times daily as directed. 1 each 0   buPROPion (WELLBUTRIN XL) 300 MG 24 hr tablet TAKE 1 TABLET (300 MG TOTAL) BY MOUTH EVERY MORNING. 90 tablet 1   calcitRIOL (ROCALTROL)  0.25 MCG capsule Take 0.25 mcg by mouth daily.     chlorproMAZINE (THORAZINE) 25 MG tablet TAKE 1 TABLET BY MOUTH THREE TIMES A DAY 270 tablet 1   chlorthalidone (HYGROTON) 25 MG tablet Take 25 mg by mouth daily.     Continuous Blood Gluc Receiver (FREESTYLE LIBRE 14 DAY READER) DEVI Use with freestyle libre system 1 each 0   Continuous Glucose Sensor (FREESTYLE LIBRE 3 PLUS SENSOR) MISC Change sensor every 15  days. 6 each 3   Finerenone (KERENDIA) 10 MG TABS Take 10 mg by mouth daily.     gabapentin (NEURONTIN) 300 MG capsule TAKE ONE CAPSULE BY MOUTH EVERYDAY AT BEDTIME 90 capsule 3   gentamicin cream (GARAMYCIN) 0.1 % Apply topically daily.     glipiZIDE (GLUCOTROL XL) 5 MG 24 hr tablet TAKE 1 TABLET BY MOUTH TWICE A DAY 180 tablet 1   insulin aspart (NOVOLOG FLEXPEN) 100 UNIT/ML FlexPen Inject 5 Units into the skin 3 (three) times daily with meals. (Patient taking differently: Inject 8 Units into the skin 2 (two) times daily before lunch and supper.) 15 mL 1   insulin glargine (LANTUS SOLOSTAR) 100 UNIT/ML Solostar Pen INJECT 20 UNITS INTO THE SKIN DAILY 15 mL 0   Insulin Pen Needle (B-D ULTRAFINE III SHORT PEN) 31G X 8 MM MISC Use to inject insulin 4 times per day as instructed 100 each 11   lactulose (CHRONULAC) 10 GM/15ML solution Take by mouth.     Lancets (ONETOUCH DELICA PLUS LANCET33G) MISC Apply topically.     losartan (COZAAR) 100 MG tablet TAKE 1 TABLET BY MOUTH EVERY DAY IN THE MORNING 90 tablet 1   Methoxy PEG-Epoetin Beta (MIRCERA IJ) Inject into the skin.     metoCLOPramide (REGLAN) 10 MG tablet TAKE 1 TABLET BY MOUTH 4 TIMES DAILY. 120 tablet 0   metoprolol tartrate (LOPRESSOR) 50 MG tablet Take 50 mg by mouth 2 (two) times daily.     ondansetron (ZOFRAN-ODT) 4 MG disintegrating tablet Take 1 tablet (4 mg total) by mouth every 8 (eight) hours as needed for nausea or vomiting. 20 tablet 0   ONETOUCH ULTRA test strip USE TO check blood glucose UP TO four times daily AS DIRECTED      pantoprazole (PROTONIX) 40 MG tablet TAKE 1 TABLET BY MOUTH EVERY DAY 90 tablet 0   potassium chloride (KLOR-CON) 10 MEQ tablet Take by mouth.     tiZANidine (ZANAFLEX) 2 MG tablet TAKE ONE TABLET BY MOUTH EVERYDAY AT BEDTIME 90 tablet 3   No current facility-administered medications on file prior to visit.    BP (!) 150/70       Objective:   Physical Exam Vitals and nursing note reviewed.  Constitutional:      Appearance: Normal appearance.  Cardiovascular:     Rate and Rhythm: Normal rate and regular rhythm.     Pulses: Normal pulses.     Heart sounds: Normal heart sounds.  Pulmonary:     Effort: Pulmonary effort is normal.     Breath sounds: Normal breath sounds.  Musculoskeletal:     Right wrist: Swelling present. No deformity, tenderness or bony tenderness. Normal range of motion. Normal pulse.     Right hand: Swelling present. No tenderness or bony tenderness. Normal strength. Normal sensation. There is no disruption of two-point discrimination. Normal capillary refill.     Right lower leg: Edema present.     Left lower leg: Edema present.     Comments: +2 pitting edema  noted to dorsal aspect of right hand and fingers. Does not go past his wrist   Skin:    General: Skin is warm and dry.  Neurological:     General: No focal deficit present.     Mental Status: He is alert and oriented to person, place, and time.  Psychiatric:        Mood and Affect: Mood normal.        Behavior: Behavior normal.  Thought Content: Thought content normal.        Judgment: Judgment normal.       Assessment & Plan:  1. Diabetes mellitus treated with oral medication (HCC) (Primary)  - POC HgB A1c- 6.8  - At goal.  - continue with Glipizide - Follow up in 3 months   2. Current use of insulin (HCC) - Continue with current therapy   3. Essential hypertension - elevated in the office today  - Continue to monitor   4. Swelling of right hand - Need to r/o DVT. Likely from  CKD  - VAS [place] UPPER EXTREMITY VENOUS DUPLEX; Future  Shirline Frees, NP

## 2023-12-10 NOTE — Patient Instructions (Signed)
 Health Maintenance Due  Topic Date Due   OPHTHALMOLOGY EXAM  11/16/2023   Medicare Annual Wellness (AWV)  01/21/2024       02/28/2023    7:12 AM 01/21/2023   12:40 PM 01/08/2023    8:36 AM  Depression screen PHQ 2/9  Decreased Interest 0 0 1  Down, Depressed, Hopeless 0 0 0  PHQ - 2 Score 0 0 1  Altered sleeping 2 0 3  Tired, decreased energy 0 0 3  Change in appetite 1 0 2  Feeling bad or failure about yourself  0 0 1  Trouble concentrating 0 0 0  Moving slowly or fidgety/restless 1 0 3  Suicidal thoughts 0 0 1  PHQ-9 Score 4 0 14  Difficult doing work/chores Not difficult at all Not difficult at all Somewhat difficult

## 2023-12-11 ENCOUNTER — Ambulatory Visit (HOSPITAL_COMMUNITY)
Admission: RE | Admit: 2023-12-11 | Discharge: 2023-12-11 | Disposition: A | Discharge status: Home / Self Care | Source: Ambulatory Visit | Attending: Adult Health | Admitting: Adult Health

## 2023-12-11 ENCOUNTER — Other Ambulatory Visit: Payer: Self-pay | Admitting: Adult Health

## 2023-12-11 DIAGNOSIS — M7989 Other specified soft tissue disorders: Secondary | ICD-10-CM | POA: Insufficient documentation

## 2023-12-12 ENCOUNTER — Encounter (HOSPITAL_COMMUNITY)

## 2023-12-26 ENCOUNTER — Telehealth (INDEPENDENT_AMBULATORY_CARE_PROVIDER_SITE_OTHER): Admitting: Adult Health

## 2023-12-26 DIAGNOSIS — R2681 Unsteadiness on feet: Secondary | ICD-10-CM

## 2023-12-26 DIAGNOSIS — R4189 Other symptoms and signs involving cognitive functions and awareness: Secondary | ICD-10-CM | POA: Diagnosis not present

## 2023-12-26 NOTE — Progress Notes (Signed)
 Virtual Visit via Video Note  I connected with Russell Austin  on 12/26/23 at  1:00 PM EDT by a video enabled telemedicine application and verified that I am speaking with the correct person using two identifiers.  Location patient: home Location provider:work or home office Persons participating in the virtual visit: patient, provider, wife   I discussed the limitations of evaluation and management by telemedicine and the availability of in person appointments. The patient expressed understanding and agreed to proceed.   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  He and his wife need help obtaining higher level of skilled care for the patient.  He is having a lot of falls at home and safety has become an issue because their bedrooms are on the second floor and his wife is concerned that he may become confused and fall down the stairs.  She reports that she went away for a weekend and the patient stayed with his son and fell 12 times in 3 days, thankfully there were no severe injuries.  He is also losing bodily function, having some increased confusion at times and is not using sterile technique to change his peritoneal dialysis catheter and has caused multiple infections.  They are looking at skilled nursing homes but unsure of what direction they need to go in since the patients safety is a concern.    ROS: See pertinent positives and negatives per HPI.  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   Chronic kidney disease    Diabetes mellitus type II    Diabetic retinopathy FOLLOWED BY DR Seward Dao   ED (erectile dysfunction)    Glaucoma of both eyes     Hypertension    Left hydrocele    Stroke [facility])     Past Surgical History:  Procedure Laterality Date   APPENDECTOMY  AGE EARLY 20'S   CAPD INSERTION N/A 04/11/2023   Procedure: LAPAROSCOPIC INSERTION CONTINUOUS AMBULATORY PERITONEAL DIALYSIS  (CAPD) CATHETER WITH  OMENTOPEXY;  Surgeon: Carlene Che, MD;  Location: [facility] OR;  Service: Vascular;  Laterality: N/A;   COLONOSCOPY  12/30/2020   every 5 years   HYDROCELE EXCISION  03/31/2012   Procedure: HYDROCELECTOMY ADULT;  Surgeon: Trent Frizzle, MD;  Location: [facility];  Service: Urology;  Laterality: Left;  45 MINS     LEFT EYE LASER RETINA REPAIR  SEPT 2012   RIGHT EYE VITRECTOMY/ INSERTION GLAUCOMA SETON/ LASER REPAIR  12-13-2008   RETINAL ARTERY OCCLUSION /NEOVASCULAR GLAUCOMA/ HEMORRHAGE   RIGHT EYE VITRETOMY/ INSERTION GLAUCOMA SETON X2/ LASER  03-24-2009   RECURRENT HEMORRHAGE/ OCCLUSION INTERNAL SETON   SHOULDER ARTHROSCOPY Right 2005   undescended right testicle removed  1994    Family History  Problem Relation Age of Onset   Glaucoma Mother    Diabetes Mother    Stomach cancer Maternal Grandfather    Stroke Maternal Grandmother        Current Outpatient Medications:    amLODipine  (NORVASC ) 5 MG tablet, Take 1 tablet (5 mg total) by mouth daily., Disp: 90 tablet, Rfl: 3   aspirin  EC 81 MG tablet, Take 81 mg by mouth daily., Disp: , Rfl:    atorvastatin  (LIPITOR)  10 MG tablet, Take 1 tablet (10 mg total) by mouth every morning., Disp: 90 tablet, Rfl: 3   blood glucose meter kit and supplies KIT, Dispense based on patient and insurance preference. Use up to four times daily as directed., Disp: 1 each, Rfl: 0   buPROPion  (WELLBUTRIN  XL) 300 MG 24 hr tablet, TAKE 1 TABLET (300 MG TOTAL) BY MOUTH EVERY MORNING., Disp: 90 tablet, Rfl: 1   calcitRIOL  (ROCALTROL ) 0.25 MCG capsule, Take 0.25 mcg by mouth daily., Disp: , Rfl:    chlorproMAZINE  (THORAZINE ) 25 MG tablet, TAKE 1 TABLET BY MOUTH THREE TIMES  A DAY, Disp: 270 tablet, Rfl: 1   chlorthalidone (HYGROTON) 25 MG tablet, Take 25 mg by mouth daily., Disp: , Rfl:    Continuous Blood Gluc Receiver (FREESTYLE LIBRE 14 DAY READER) DEVI, Use with freestyle libre system, Disp: 1 each, Rfl: 0   Continuous Glucose Sensor (FREESTYLE LIBRE 3 PLUS SENSOR) MISC, Change sensor every 15 days., Disp: 6 each, Rfl: 3   Finerenone (KERENDIA) 10 MG TABS, Take 10 mg by mouth daily., Disp: , Rfl:    gabapentin  (NEURONTIN ) 300 MG capsule, TAKE ONE CAPSULE BY MOUTH EVERYDAY AT BEDTIME, Disp: 90 capsule, Rfl: 3   gentamicin  cream (GARAMYCIN ) 0.1 %, Apply topically daily., Disp: , Rfl:    glipiZIDE  (GLUCOTROL  XL) 5 MG 24 hr tablet, TAKE 1 TABLET BY MOUTH TWICE A DAY, Disp: 180 tablet, Rfl: 1   insulin  aspart (NOVOLOG  FLEXPEN) 100 UNIT/ML FlexPen, Inject 5 Units into the skin 3 (three) times daily with meals. (Patient taking differently: Inject 8 Units into the skin 2 (two) times daily before lunch and supper.), Disp: 15 mL, Rfl: 1   insulin  glargine (LANTUS  SOLOSTAR) 100 UNIT/ML Solostar Pen, INJECT 20 UNITS INTO THE SKIN DAILY, Disp: 15 mL, Rfl: 0   Insulin  Pen Needle (B-D ULTRAFINE III SHORT PEN) 31G X 8 MM MISC, Use to inject insulin  4 times per day as instructed, Disp: 100 each, Rfl: 11   lactulose (CHRONULAC) 10 GM/15ML solution, Take by mouth., Disp: , Rfl:    Lancets (ONETOUCH DELICA PLUS LANCET33G) MISC, Apply topically., Disp: , Rfl:    losartan  (COZAAR ) 100 MG tablet, TAKE 1 TABLET BY MOUTH EVERY DAY IN THE MORNING, Disp: 90 tablet, Rfl: 1   Methoxy PEG-Epoetin Beta (MIRCERA IJ), Inject into the skin., Disp: , Rfl:    metoCLOPramide  (REGLAN ) 10 MG tablet, TAKE 1 TABLET BY MOUTH 4 TIMES DAILY., Disp: 120 tablet, Rfl: 0   metoprolol  tartrate (LOPRESSOR ) 50 MG tablet, Take 50 mg by mouth 2 (two) times daily., Disp: , Rfl:    ondansetron  (ZOFRAN -ODT) 4 MG disintegrating tablet, Take 1 tablet (4 mg total) by mouth every 8 (eight) hours as needed for nausea or  vomiting., Disp: 20 tablet, Rfl: 0   ONETOUCH ULTRA test strip, USE TO check blood glucose UP TO four times daily AS DIRECTED, Disp: , Rfl:    pantoprazole  (PROTONIX ) 40 MG tablet, TAKE 1 TABLET BY MOUTH EVERY DAY, Disp: 90 tablet, Rfl: 0   potassium chloride  (KLOR-CON ) 10 MEQ tablet, Take by mouth., Disp: , Rfl:    tiZANidine  (ZANAFLEX ) 2 MG tablet, TAKE ONE TABLET BY MOUTH EVERYDAY AT BEDTIME, Disp: 90 tablet, Rfl: 3  EXAM:  VITALS per patient if applicable:  GENERAL: alert, oriented, appears well and in no acute distress  HEENT: atraumatic, conjunttiva clear, no obvious abnormalities on inspection of external nose and ears  NECK: normal movements of the head and neck  LUNGS: on  inspection no signs of respiratory distress, breathing rate appears normal, no obvious gross SOB, gasping or wheezing  CV: no obvious cyanosis  MS: moves all visible extremities without noticeable abnormality  PSYCH/NEURO: pleasant and cooperative, no obvious depression or anxiety, speech and thought processing grossly intact  ASSESSMENT AND PLAN:  Discussed the following assessment and plan:  1. Gait instability (Primary) - Will refer to our social worker to help facilitate placement at skilled nursing facility. - AMB Referral [facility] Care Management  2. Cognitive impairment  - AMB Referral [facility] Care Management    I discussed the assessment and treatment plan with the patient. The patient was provided an opportunity to ask questions and all were answered. The patient agreed with the plan and demonstrated an understanding of the instructions.   The patient was advised to call back or seek an in-person evaluation if the symptoms worsen or if the condition fails to improve as anticipated.   Laksh Hinners, NP

## 2023-12-27 ENCOUNTER — Other Ambulatory Visit: Payer: Self-pay

## 2023-12-27 ENCOUNTER — Inpatient Hospital Stay (HOSPITAL_COMMUNITY)
Admission: EM | Admit: 2023-12-27 | Discharge: 2024-01-05 | DRG: 981 | Disposition: A | Discharge status: Skilled Nursing Facility | Attending: Internal Medicine | Admitting: Internal Medicine

## 2023-12-27 ENCOUNTER — Inpatient Hospital Stay (HOSPITAL_COMMUNITY)

## 2023-12-27 ENCOUNTER — Encounter (HOSPITAL_COMMUNITY): Payer: Self-pay | Admitting: Emergency Medicine

## 2023-12-27 ENCOUNTER — Emergency Department (HOSPITAL_COMMUNITY)

## 2023-12-27 DIAGNOSIS — H548 Legal blindness, as defined in USA: Secondary | ICD-10-CM | POA: Diagnosis present

## 2023-12-27 DIAGNOSIS — N179 Acute kidney failure, unspecified: Secondary | ICD-10-CM | POA: Diagnosis not present

## 2023-12-27 DIAGNOSIS — Z1619 Resistance to other specified beta lactam antibiotics: Secondary | ICD-10-CM | POA: Diagnosis not present

## 2023-12-27 DIAGNOSIS — Z833 Family history of diabetes mellitus: Secondary | ICD-10-CM

## 2023-12-27 DIAGNOSIS — B952 Enterococcus as the cause of diseases classified elsewhere: Secondary | ICD-10-CM | POA: Diagnosis not present

## 2023-12-27 DIAGNOSIS — R1111 Vomiting without nausea: Secondary | ICD-10-CM | POA: Diagnosis not present

## 2023-12-27 DIAGNOSIS — E11319 Type 2 diabetes mellitus with unspecified diabetic retinopathy without macular edema: Secondary | ICD-10-CM | POA: Diagnosis not present

## 2023-12-27 DIAGNOSIS — T85838A Hemorrhage due to other internal prosthetic devices, implants and grafts, initial encounter: Secondary | ICD-10-CM | POA: Diagnosis present

## 2023-12-27 DIAGNOSIS — R748 Abnormal levels of other serum enzymes: Secondary | ICD-10-CM | POA: Diagnosis not present

## 2023-12-27 DIAGNOSIS — Z7984 Long term (current) use of oral hypoglycemic drugs: Secondary | ICD-10-CM

## 2023-12-27 DIAGNOSIS — E785 Hyperlipidemia, unspecified: Secondary | ICD-10-CM | POA: Diagnosis not present

## 2023-12-27 DIAGNOSIS — E1122 Type 2 diabetes mellitus with diabetic chronic kidney disease: Secondary | ICD-10-CM | POA: Diagnosis present

## 2023-12-27 DIAGNOSIS — R279 Unspecified lack of coordination: Secondary | ICD-10-CM | POA: Diagnosis not present

## 2023-12-27 DIAGNOSIS — R5383 Other fatigue: Secondary | ICD-10-CM | POA: Diagnosis not present

## 2023-12-27 DIAGNOSIS — Z1611 Resistance to penicillins: Secondary | ICD-10-CM | POA: Diagnosis not present

## 2023-12-27 DIAGNOSIS — R531 Weakness: Secondary | ICD-10-CM | POA: Diagnosis not present

## 2023-12-27 DIAGNOSIS — E8770 Fluid overload, unspecified: Secondary | ICD-10-CM | POA: Diagnosis present

## 2023-12-27 DIAGNOSIS — E11618 Type 2 diabetes mellitus with other diabetic arthropathy: Secondary | ICD-10-CM

## 2023-12-27 DIAGNOSIS — T8571XA Infection and inflammatory reaction due to peritoneal dialysis catheter, initial encounter: Secondary | ICD-10-CM | POA: Diagnosis not present

## 2023-12-27 DIAGNOSIS — Z7982 Long term (current) use of aspirin: Secondary | ICD-10-CM

## 2023-12-27 DIAGNOSIS — Z83511 Family history of glaucoma: Secondary | ICD-10-CM

## 2023-12-27 DIAGNOSIS — Z91018 Allergy to other foods: Secondary | ICD-10-CM

## 2023-12-27 DIAGNOSIS — I10 Essential (primary) hypertension: Secondary | ICD-10-CM | POA: Diagnosis not present

## 2023-12-27 DIAGNOSIS — A4181 Sepsis due to Enterococcus: Secondary | ICD-10-CM | POA: Diagnosis not present

## 2023-12-27 DIAGNOSIS — E8809 Other disorders of plasma-protein metabolism, not elsewhere classified: Secondary | ICD-10-CM | POA: Diagnosis not present

## 2023-12-27 DIAGNOSIS — Y841 Kidney dialysis as the cause of abnormal reaction of the patient, or of later complication, without mention of misadventure at the time of the procedure: Secondary | ICD-10-CM | POA: Diagnosis present

## 2023-12-27 DIAGNOSIS — N2581 Secondary hyperparathyroidism of renal origin: Secondary | ICD-10-CM | POA: Diagnosis present

## 2023-12-27 DIAGNOSIS — K219 Gastro-esophageal reflux disease without esophagitis: Secondary | ICD-10-CM | POA: Diagnosis present

## 2023-12-27 DIAGNOSIS — R7401 Elevation of levels of liver transaminase levels: Secondary | ICD-10-CM | POA: Diagnosis present

## 2023-12-27 DIAGNOSIS — M6281 Muscle weakness (generalized): Secondary | ICD-10-CM | POA: Diagnosis not present

## 2023-12-27 DIAGNOSIS — E871 Hypo-osmolality and hyponatremia: Secondary | ICD-10-CM | POA: Diagnosis not present

## 2023-12-27 DIAGNOSIS — I160 Hypertensive urgency: Secondary | ICD-10-CM

## 2023-12-27 DIAGNOSIS — Z79899 Other long term (current) drug therapy: Secondary | ICD-10-CM

## 2023-12-27 DIAGNOSIS — K659 Peritonitis, unspecified: Secondary | ICD-10-CM | POA: Diagnosis not present

## 2023-12-27 DIAGNOSIS — B964 Proteus (mirabilis) (morganii) as the cause of diseases classified elsewhere: Secondary | ICD-10-CM | POA: Diagnosis not present

## 2023-12-27 DIAGNOSIS — E1165 Type 2 diabetes mellitus with hyperglycemia: Secondary | ICD-10-CM | POA: Diagnosis not present

## 2023-12-27 DIAGNOSIS — D649 Anemia, unspecified: Secondary | ICD-10-CM | POA: Diagnosis present

## 2023-12-27 DIAGNOSIS — T827XXA Infection and inflammatory reaction due to other cardiac and vascular devices, implants and grafts, initial encounter: Secondary | ICD-10-CM | POA: Diagnosis not present

## 2023-12-27 DIAGNOSIS — E1140 Type 2 diabetes mellitus with diabetic neuropathy, unspecified: Secondary | ICD-10-CM | POA: Diagnosis present

## 2023-12-27 DIAGNOSIS — I120 Hypertensive chronic kidney disease with stage 5 chronic kidney disease or end stage renal disease: Secondary | ICD-10-CM | POA: Diagnosis present

## 2023-12-27 DIAGNOSIS — Z8673 Personal history of transient ischemic attack (TIA), and cerebral infarction without residual deficits: Secondary | ICD-10-CM

## 2023-12-27 DIAGNOSIS — B9689 Other specified bacterial agents as the cause of diseases classified elsewhere: Secondary | ICD-10-CM | POA: Diagnosis not present

## 2023-12-27 DIAGNOSIS — J9811 Atelectasis: Secondary | ICD-10-CM | POA: Diagnosis present

## 2023-12-27 DIAGNOSIS — Z992 Dependence on renal dialysis: Secondary | ICD-10-CM | POA: Diagnosis not present

## 2023-12-27 DIAGNOSIS — R739 Hyperglycemia, unspecified: Secondary | ICD-10-CM | POA: Diagnosis not present

## 2023-12-27 DIAGNOSIS — N186 End stage renal disease: Secondary | ICD-10-CM

## 2023-12-27 DIAGNOSIS — E876 Hypokalemia: Secondary | ICD-10-CM | POA: Diagnosis present

## 2023-12-27 DIAGNOSIS — Z0189 Encounter for other specified special examinations: Secondary | ICD-10-CM | POA: Diagnosis not present

## 2023-12-27 DIAGNOSIS — R197 Diarrhea, unspecified: Secondary | ICD-10-CM | POA: Diagnosis not present

## 2023-12-27 DIAGNOSIS — D631 Anemia in chronic kidney disease: Secondary | ICD-10-CM | POA: Diagnosis present

## 2023-12-27 DIAGNOSIS — E11649 Type 2 diabetes mellitus with hypoglycemia without coma: Secondary | ICD-10-CM | POA: Diagnosis not present

## 2023-12-27 DIAGNOSIS — Z7401 Bed confinement status: Secondary | ICD-10-CM | POA: Diagnosis not present

## 2023-12-27 DIAGNOSIS — R109 Unspecified abdominal pain: Secondary | ICD-10-CM | POA: Diagnosis not present

## 2023-12-27 DIAGNOSIS — R188 Other ascites: Secondary | ICD-10-CM | POA: Diagnosis not present

## 2023-12-27 DIAGNOSIS — H409 Unspecified glaucoma: Secondary | ICD-10-CM | POA: Diagnosis present

## 2023-12-27 DIAGNOSIS — Z885 Allergy status to narcotic agent status: Secondary | ICD-10-CM

## 2023-12-27 DIAGNOSIS — E739 Lactose intolerance, unspecified: Secondary | ICD-10-CM | POA: Diagnosis present

## 2023-12-27 DIAGNOSIS — B961 Klebsiella pneumoniae [K. pneumoniae] as the cause of diseases classified elsewhere: Secondary | ICD-10-CM | POA: Diagnosis not present

## 2023-12-27 DIAGNOSIS — D75839 Thrombocytosis, unspecified: Secondary | ICD-10-CM | POA: Diagnosis not present

## 2023-12-27 DIAGNOSIS — Z888 Allergy status to other drugs, medicaments and biological substances status: Secondary | ICD-10-CM

## 2023-12-27 DIAGNOSIS — Z794 Long term (current) use of insulin: Secondary | ICD-10-CM | POA: Diagnosis not present

## 2023-12-27 DIAGNOSIS — Z823 Family history of stroke: Secondary | ICD-10-CM

## 2023-12-27 DIAGNOSIS — B955 Unspecified streptococcus as the cause of diseases classified elsewhere: Secondary | ICD-10-CM | POA: Diagnosis not present

## 2023-12-27 DIAGNOSIS — R2689 Other abnormalities of gait and mobility: Secondary | ICD-10-CM | POA: Diagnosis not present

## 2023-12-27 DIAGNOSIS — Z800 Family history of malignant neoplasm of digestive organs: Secondary | ICD-10-CM

## 2023-12-27 DIAGNOSIS — R112 Nausea with vomiting, unspecified: Secondary | ICD-10-CM | POA: Diagnosis not present

## 2023-12-27 HISTORY — DX: Hypertensive urgency: I16.0

## 2023-12-27 HISTORY — PX: IR FLUORO GUIDE CV LINE LEFT: IMG2282

## 2023-12-27 HISTORY — PX: IR US GUIDE VASC ACCESS LEFT: IMG2389

## 2023-12-27 LAB — BODY FLUID CELL COUNT WITH DIFFERENTIAL
Eos, Fluid: 1 %
Lymphs, Fluid: 5 %
Monocyte-Macrophage-Serous Fluid: 3 % — ABNORMAL LOW (ref 50–90)
Neutrophil Count, Fluid: 91 % — ABNORMAL HIGH (ref 0–25)
Total Nucleated Cell Count, Fluid: 95 uL (ref 0–1000)

## 2023-12-27 LAB — CBC
HCT: 26.4 % — ABNORMAL LOW (ref 39.0–52.0)
Hemoglobin: 8.1 g/dL — ABNORMAL LOW (ref 13.0–17.0)
MCH: 25.7 pg — ABNORMAL LOW (ref 26.0–34.0)
MCHC: 30.7 g/dL (ref 30.0–36.0)
MCV: 83.8 fL (ref 80.0–100.0)
Platelets: 426 10*3/uL — ABNORMAL HIGH (ref 150–400)
RBC: 3.15 MIL/uL — ABNORMAL LOW (ref 4.22–5.81)
RDW: 18 % — ABNORMAL HIGH (ref 11.5–15.5)
WBC: 6.7 10*3/uL (ref 4.0–10.5)
nRBC: 0 % (ref 0.0–0.2)

## 2023-12-27 LAB — LIPASE, BLOOD: Lipase: 22 U/L (ref 11–51)

## 2023-12-27 LAB — I-STAT CG4 LACTIC ACID, ED
Lactic Acid, Venous: 1.9 mmol/L (ref 0.5–1.9)
Lactic Acid, Venous: 2.1 mmol/L (ref 0.5–1.9)

## 2023-12-27 LAB — COMPREHENSIVE METABOLIC PANEL WITH GFR
ALT: 38 U/L (ref 0–44)
AST: 45 U/L — ABNORMAL HIGH (ref 15–41)
Albumin: 1.9 g/dL — ABNORMAL LOW (ref 3.5–5.0)
Alkaline Phosphatase: 108 U/L (ref 38–126)
Anion gap: 15 (ref 5–15)
BUN: 25 mg/dL — ABNORMAL HIGH (ref 8–23)
CO2: 28 mmol/L (ref 22–32)
Calcium: 8 mg/dL — ABNORMAL LOW (ref 8.9–10.3)
Chloride: 94 mmol/L — ABNORMAL LOW (ref 98–111)
Creatinine, Ser: 2.62 mg/dL — ABNORMAL HIGH (ref 0.61–1.24)
GFR, Estimated: 25 mL/min — ABNORMAL LOW (ref >60)
Glucose, Bld: 329 mg/dL — ABNORMAL HIGH (ref 70–99)
Potassium: 3.4 mmol/L — ABNORMAL LOW (ref 3.5–5.1)
Sodium: 137 mmol/L (ref 135–145)
Total Bilirubin: 0.6 mg/dL (ref 0.0–1.2)
Total Protein: 7.2 g/dL (ref 6.5–8.1)

## 2023-12-27 LAB — GRAM STAIN

## 2023-12-27 LAB — HEPATITIS B SURFACE ANTIGEN: Hepatitis B Surface Ag: NONREACTIVE

## 2023-12-27 LAB — PHOSPHORUS: Phosphorus: 2.6 mg/dL (ref 2.5–4.6)

## 2023-12-27 LAB — CBG MONITORING, ED
Glucose-Capillary: 272 mg/dL — ABNORMAL HIGH (ref 70–99)
Glucose-Capillary: 425 mg/dL — ABNORMAL HIGH (ref 70–99)

## 2023-12-27 MED ORDER — HEPARIN SODIUM (PORCINE) 1000 UNIT/ML IJ SOLN
INTRAMUSCULAR | Status: AC
  Filled 2023-12-27: qty 10

## 2023-12-27 MED ORDER — INSULIN GLARGINE-YFGN 100 UNIT/ML ~~LOC~~ SOLN
18.0000 [IU] | Freq: Every day | SUBCUTANEOUS | Status: DC
  Administered 2023-12-27 – 2024-01-03 (×7): 18 [IU] via SUBCUTANEOUS
  Filled 2023-12-27 (×9): qty 0.18

## 2023-12-27 MED ORDER — PANTOPRAZOLE SODIUM 40 MG PO TBEC
40.0000 mg | DELAYED_RELEASE_TABLET | Freq: Every day | ORAL | Status: DC
  Administered 2023-12-27 – 2024-01-05 (×9): 40 mg via ORAL
  Filled 2023-12-27 (×9): qty 1

## 2023-12-27 MED ORDER — CHLORPROMAZINE HCL 25 MG PO TABS: 25.0000 mg | ORAL_TABLET | Freq: Every day | ORAL | Status: DC | PRN

## 2023-12-27 MED ORDER — LIDOCAINE-EPINEPHRINE 1 %-1:100000 IJ SOLN
INTRAMUSCULAR | Status: AC
  Filled 2023-12-27: qty 1

## 2023-12-27 MED ORDER — LIDOCAINE HCL 1 % IJ SOLN
20.0000 mL | Freq: Once | INTRAMUSCULAR | Status: AC
  Administered 2023-12-27: 10 mL via INTRADERMAL

## 2023-12-27 MED ORDER — SODIUM CHLORIDE 0.9% FLUSH
10.0000 mL | Freq: Two times a day (BID) | INTRAVENOUS | Status: DC
  Administered 2023-12-27 – 2024-01-05 (×7): 10 mL

## 2023-12-27 MED ORDER — SODIUM CHLORIDE 0.9 % IV SOLN
20.0000 ug | Freq: Once | INTRAVENOUS | Status: AC
  Administered 2023-12-27: 20 ug via INTRAVENOUS
  Filled 2023-12-27: qty 5

## 2023-12-27 MED ORDER — SODIUM CHLORIDE 0.9% FLUSH
3.0000 mL | Freq: Two times a day (BID) | INTRAVENOUS | Status: DC
  Administered 2023-12-27 – 2024-01-05 (×12): 3 mL via INTRAVENOUS

## 2023-12-27 MED ORDER — ONDANSETRON HCL 4 MG/2ML IJ SOLN
4.0000 mg | Freq: Once | INTRAMUSCULAR | Status: AC
Start: 2023-12-27 — End: 2023-12-27
  Administered 2023-12-27: 4 mg via INTRAVENOUS
  Filled 2023-12-27: qty 2

## 2023-12-27 MED ORDER — ONDANSETRON HCL 4 MG PO TABS: 4.0000 mg | ORAL_TABLET | Freq: Four times a day (QID) | ORAL | Status: DC | PRN

## 2023-12-27 MED ORDER — FUROSEMIDE 10 MG/ML IJ SOLN
160.0000 mg | Freq: Four times a day (QID) | INTRAVENOUS | Status: AC
  Administered 2023-12-27: 160 mg via INTRAVENOUS
  Filled 2023-12-27: qty 16
  Filled 2023-12-27: qty 10

## 2023-12-27 MED ORDER — BUPROPION HCL ER (XL) 150 MG PO TB24
300.0000 mg | ORAL_TABLET | Freq: Every morning | ORAL | Status: DC
  Administered 2023-12-27 – 2024-01-05 (×9): 300 mg via ORAL
  Filled 2023-12-27 (×9): qty 2

## 2023-12-27 MED ORDER — OXIDIZED CELLULOSE EX PADS
1.0000 | MEDICATED_PAD | Freq: Once | CUTANEOUS | Status: AC
  Administered 2023-12-27: 1 via TOPICAL
  Filled 2023-12-27: qty 1

## 2023-12-27 MED ORDER — CHLORHEXIDINE GLUCONATE CLOTH 2 % EX PADS
6.0000 | MEDICATED_PAD | Freq: Every day | CUTANEOUS | Status: DC
  Administered 2023-12-28 – 2024-01-02 (×5): 6 via TOPICAL

## 2023-12-27 MED ORDER — FENTANYL CITRATE PF 50 MCG/ML IJ SOSY
25.0000 ug | PREFILLED_SYRINGE | INTRAMUSCULAR | Status: DC | PRN
  Filled 2023-12-27: qty 1

## 2023-12-27 MED ORDER — ONDANSETRON HCL 4 MG/2ML IJ SOLN: 4.0000 mg | Freq: Four times a day (QID) | INTRAMUSCULAR | Status: DC | PRN

## 2023-12-27 MED ORDER — INSULIN ASPART 100 UNIT/ML IJ SOLN
0.0000 [IU] | Freq: Three times a day (TID) | INTRAMUSCULAR | Status: DC
  Administered 2023-12-27: 15 [IU] via SUBCUTANEOUS
  Administered 2023-12-28: 3 [IU] via SUBCUTANEOUS
  Administered 2023-12-28: 2 [IU] via SUBCUTANEOUS
  Administered 2023-12-30: 3 [IU] via SUBCUTANEOUS
  Administered 2023-12-30: 2 [IU] via SUBCUTANEOUS
  Administered 2023-12-31: 3 [IU] via SUBCUTANEOUS
  Administered 2023-12-31 – 2024-01-01 (×3): 5 [IU] via SUBCUTANEOUS

## 2023-12-27 MED ORDER — VANCOMYCIN HCL 1500 MG/300ML IV SOLN
1500.0000 mg | Freq: Once | INTRAVENOUS | Status: AC
  Administered 2023-12-27: 1500 mg via INTRAVENOUS
  Filled 2023-12-27: qty 300

## 2023-12-27 MED ORDER — AMLODIPINE BESYLATE 5 MG PO TABS
5.0000 mg | ORAL_TABLET | Freq: Every day | ORAL | Status: DC
  Administered 2023-12-27 – 2023-12-29 (×3): 5 mg via ORAL
  Filled 2023-12-27 (×3): qty 1

## 2023-12-27 MED ORDER — LIDOCAINE HCL 1 % IJ SOLN
INTRAMUSCULAR | Status: AC
  Filled 2023-12-27: qty 20

## 2023-12-27 MED ORDER — ACETAMINOPHEN 650 MG RE SUPP: 650.0000 mg | Freq: Four times a day (QID) | RECTAL | Status: DC | PRN

## 2023-12-27 MED ORDER — METOPROLOL TARTRATE 50 MG PO TABS
50.0000 mg | ORAL_TABLET | Freq: Two times a day (BID) | ORAL | Status: DC
  Administered 2023-12-27 – 2024-01-05 (×14): 50 mg via ORAL
  Filled 2023-12-27 (×2): qty 1
  Filled 2023-12-27: qty 2
  Filled 2023-12-27 (×13): qty 1

## 2023-12-27 MED ORDER — ACETAMINOPHEN 325 MG PO TABS
650.0000 mg | ORAL_TABLET | Freq: Four times a day (QID) | ORAL | Status: DC | PRN
  Administered 2024-01-03: 650 mg via ORAL

## 2023-12-27 MED ORDER — CALCITRIOL 0.5 MCG PO CAPS
0.5000 ug | ORAL_CAPSULE | Freq: Every day | ORAL | Status: DC
  Administered 2023-12-27 – 2024-01-05 (×9): 0.5 ug via ORAL
  Filled 2023-12-27 (×10): qty 1

## 2023-12-27 MED ORDER — ATORVASTATIN CALCIUM 10 MG PO TABS
10.0000 mg | ORAL_TABLET | Freq: Every morning | ORAL | Status: DC
  Administered 2023-12-27 – 2024-01-04 (×8): 10 mg via ORAL
  Filled 2023-12-27 (×8): qty 1

## 2023-12-27 MED ORDER — ALBUTEROL SULFATE (2.5 MG/3ML) 0.083% IN NEBU: 2.5000 mg | INHALATION_SOLUTION | Freq: Four times a day (QID) | RESPIRATORY_TRACT | Status: DC | PRN

## 2023-12-27 MED ORDER — SODIUM CHLORIDE 0.9 % IV SOLN
1.0000 g | Freq: Every day | INTRAVENOUS | Status: DC
  Administered 2023-12-27 – 2024-01-04 (×9): 1 g via INTRAVENOUS
  Filled 2023-12-27 (×10): qty 10

## 2023-12-27 MED ORDER — GENTAMICIN SULFATE 0.1 % EX CREA
1.0000 | TOPICAL_CREAM | Freq: Every day | CUTANEOUS | Status: DC
  Administered 2024-01-01 – 2024-01-05 (×5): 1 via TOPICAL
  Filled 2023-12-27 (×2): qty 15

## 2023-12-27 MED ORDER — SODIUM CHLORIDE 0.9 % IV SOLN: 2.0000 g | INTRAVENOUS | Status: DC

## 2023-12-27 MED ORDER — HYDRALAZINE HCL 20 MG/ML IJ SOLN: 10.0000 mg | INTRAMUSCULAR | Status: DC | PRN

## 2023-12-27 MED ORDER — HEPARIN SODIUM (PORCINE) 5000 UNIT/ML IJ SOLN
5000.0000 [IU] | Freq: Three times a day (TID) | INTRAMUSCULAR | Status: DC
  Administered 2023-12-27 – 2023-12-30 (×7): 5000 [IU] via SUBCUTANEOUS
  Filled 2023-12-27 (×8): qty 1

## 2023-12-27 MED ORDER — LOSARTAN POTASSIUM 50 MG PO TABS
100.0000 mg | ORAL_TABLET | Freq: Every day | ORAL | Status: DC
  Administered 2023-12-27 – 2024-01-05 (×8): 100 mg via ORAL
  Filled 2023-12-27 (×9): qty 2

## 2023-12-27 MED ORDER — INSULIN ASPART 100 UNIT/ML IJ SOLN: 8.0000 [IU] | Freq: Once | INTRAMUSCULAR | Status: DC

## 2023-12-27 MED ORDER — SODIUM CHLORIDE 0.9% FLUSH: 10.0000 mL | INTRAVENOUS | Status: DC | PRN

## 2023-12-27 NOTE — Consult Note (Signed)
 Hospital Consult    Reason for Consult: Peritonitis with request to remove PD catheter and place [facility] Referring Physician: Nephrology MRN #:  [id number]  History of Present Illness: This is a 73 y.o. male with history of stroke, ESRD, diabetes that presents with abdominal pain with vomiting and diarrhea.  Concern for peritonitis and currently using PD catheter.  This was previously placed by Dr. Edgardo Goodwill.  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   Chronic kidney disease    Diabetes mellitus type II    Diabetic retinopathy FOLLOWED BY DR Seward Dao   ED (erectile dysfunction)    Glaucoma of both eyes    Hypertension    Left hydrocele    Stroke [facility])     Past Surgical History:  Procedure Laterality Date   APPENDECTOMY  AGE EARLY 20'S   CAPD INSERTION N/A 04/11/2023   Procedure: LAPAROSCOPIC INSERTION CONTINUOUS AMBULATORY PERITONEAL DIALYSIS  (CAPD) CATHETER WITH  OMENTOPEXY;  Surgeon: Carlene Che, MD;  Location: [facility] OR;  Service: Vascular;  Laterality: N/A;   COLONOSCOPY  12/30/2020   every 5 years   HYDROCELE EXCISION  03/31/2012   Procedure: HYDROCELECTOMY ADULT;  Surgeon: Trent Frizzle, MD;  Location: [facility];  Service: Urology;  Laterality: Left;  45 MINS     LEFT EYE LASER RETINA REPAIR  SEPT 2012   RIGHT EYE VITRECTOMY/ INSERTION GLAUCOMA SETON/ LASER REPAIR  12-13-2008   RETINAL ARTERY OCCLUSION /NEOVASCULAR GLAUCOMA/ HEMORRHAGE   RIGHT EYE VITRETOMY/ INSERTION GLAUCOMA SETON X2/ LASER  03-24-2009   RECURRENT HEMORRHAGE/ OCCLUSION INTERNAL SETON   SHOULDER ARTHROSCOPY Right 2005   undescended right testicle removed  1994    Allergies  Allergen Reactions   Lactose Intolerance (Gi) Diarrhea   Apple Pectin [Pectin] Itching    ITCHY THROAT   Metformin  And Related Other (See Comments)    D/t decreased kidney function    Peach [Prunus Persica] Itching    ITCHY THROAT   Morphine   And Codeine Anxiety    Jittery    Prior to Admission medications   Medication Sig Start Date End Date Taking? Authorizing Provider  amLODipine  (NORVASC ) 5 MG tablet Take 1 tablet (5 mg total) by mouth daily. 06/11/23  Yes Nafziger, Randel Buss, NP  aspirin  EC 81 MG tablet Take 81 mg by mouth daily.   Yes [provider]  atorvastatin  (LIPITOR) 10 MG tablet Take 1 tablet (10 mg total) by mouth every morning. 05/30/23  Yes Nafziger, Randel Buss, NP  buPROPion  (WELLBUTRIN  XL) 300 MG 24 hr tablet TAKE 1 TABLET (300 MG TOTAL) BY MOUTH EVERY MORNING. 11/26/23  Yes Nafziger, Randel Buss, NP  chlorproMAZINE  (THORAZINE ) 25 MG tablet TAKE 1 TABLET BY MOUTH THREE TIMES A DAY Patient taking differently: Take 25 mg by mouth daily as needed for hiccoughs. 11/21/23  Yes Nafziger, Randel Buss, NP  gabapentin  (NEURONTIN ) 300 MG capsule TAKE ONE CAPSULE BY MOUTH EVERYDAY AT BEDTIME 05/30/23  Yes Nafziger, Randel Buss, NP  gentamicin  cream (GARAMYCIN ) 0.1 % Apply 1 Application topically daily. 04/23/23  Yes [provider]  glipiZIDE  (GLUCOTROL  XL) 5 MG 24 hr tablet TAKE 1 TABLET BY MOUTH TWICE A DAY 11/19/23  Yes Nafziger, Randel Buss, NP  insulin  aspart (NOVOLOG  FLEXPEN) 100 UNIT/ML FlexPen Inject 5 Units into the skin 3 (three) times daily with meals. Patient taking differently: Inject 8 Units into the skin daily. 06/26/23  Yes Nafziger, Randel Buss, NP  insulin  glargine (LANTUS  SOLOSTAR) 100 UNIT/ML  Solostar Pen INJECT 20 UNITS INTO THE SKIN DAILY Patient taking differently: Inject 18 Units into the skin every evening. 12/05/23  Yes Nafziger, Randel Buss, NP  lactulose (CHRONULAC) 10 GM/15ML solution Take by mouth daily as needed for mild constipation or moderate constipation. 05/15/23  Yes [provider]  losartan  (COZAAR ) 100 MG tablet TAKE 1 TABLET BY MOUTH EVERY DAY IN THE MORNING 11/26/23  Yes Nafziger, Randel Buss, NP  metoprolol  tartrate (LOPRESSOR ) 50 MG tablet Take 50 mg by mouth 2 (two) times daily. 09/07/23  Yes [provider]   pantoprazole  (PROTONIX ) 40 MG tablet TAKE 1 TABLET BY MOUTH EVERY DAY 12/05/23  Yes Nafziger, Randel Buss, NP  calcitRIOL  (ROCALTROL ) 0.25 MCG capsule Take 0.25 mcg by mouth daily. Patient not taking: Reported on 12/27/2023 01/25/23   [provider]  chlorthalidone (HYGROTON) 25 MG tablet Take 25 mg by mouth daily. Patient not taking: Reported on 12/27/2023    [provider]  Continuous Glucose Sensor (FREESTYLE LIBRE 3 PLUS SENSOR) MISC Change sensor every 15 days. 07/09/23   Nafziger, Randel Buss, NP  Insulin  Pen Needle (B-D ULTRAFINE III SHORT PEN) 31G X 8 MM MISC Use to inject insulin  4 times per day as instructed 10/02/23   Nafziger, Cory, NP  Methoxy PEG-Epoetin Beta (MIRCERA IJ) Inject into the skin. 08/22/23   [provider]  metoCLOPramide  (REGLAN ) 10 MG tablet TAKE 1 TABLET BY MOUTH 4 TIMES DAILY. Patient taking differently: Take 10 mg by mouth daily as needed for nausea or vomiting. 12/05/23   Nafziger, Randel Buss, NP  ondansetron  (ZOFRAN -ODT) 4 MG disintegrating tablet Take 1 tablet (4 mg total) by mouth every 8 (eight) hours as needed for nausea or vomiting. Patient not taking: Reported on 12/27/2023 09/16/23   Carin Charleston, MD  potassium chloride  (KLOR-CON ) 10 MEQ tablet Take by mouth. Patient not taking: Reported on 12/27/2023 07/25/23   [provider]  tiZANidine  (ZANAFLEX ) 2 MG tablet TAKE ONE TABLET BY MOUTH EVERYDAY AT BEDTIME Patient not taking: Reported on 12/27/2023 05/30/23   Nafziger, Cory, NP  torsemide (DEMADEX) 20 MG tablet Take 80 mg by mouth daily. Patient not taking: Reported on 12/27/2023 10/17/23   [provider]    Social History   Socioeconomic History   Marital status: Married    Spouse name: Not on file   Number of children: 1   Years of education: 12   Highest education level: 12th grade  Occupational History   Occupation: Disabled  Tobacco Use   Smoking status: Never   Smokeless tobacco: Never  Vaping Use   Vaping status: Never  Used  Substance and Sexual Activity   Alcohol use: No   Drug use: No   Sexual activity: Not on file  Other Topics Concern   Not on file  Social History Narrative   Lives at home with his wife and granddaughter.   Left-handed.   3 cups caffeine per day.   Social Drivers of Corporate investment banker Strain: Low Risk  (01/21/2023)   Overall Financial Resource Strain (CARDIA)    Difficulty of Paying Living Expenses: Not hard at all  Food Insecurity: No Food Insecurity (01/21/2023)   Hunger Vital Sign    Worried About Running Out of Food in the Last Year: Never true    Ran Out of Food in the Last Year: Never true  Transportation Needs: No Transportation Needs (01/21/2023)   PRAPARE - Administrator, Civil Service (Medical): No    Lack of Transportation (  Non-Medical): No  Physical Activity: Inactive (01/21/2023)   Exercise Vital Sign    Days of Exercise per Week: 0 days    Minutes of Exercise per Session: 0 min  Stress: No Stress Concern Present (01/21/2023)   [employer] of Occupational Health - Occupational Stress Questionnaire    Feeling of Stress : Not at all  Social Connections: Socially Integrated (01/21/2023)   Social Connection and Isolation Panel [NHANES]    Frequency of Communication with Friends and Family: More than three times a week    Frequency of Social Gatherings with Friends and Family: More than three times a week    Attends Religious Services: More than 4 times per year    Active Member of [employer] or Organizations: Yes    Attends Engineer, structural: More than 4 times per year    Marital Status: Married  Catering manager Violence: Not At Risk (01/21/2023)   Humiliation, Afraid, Rape, and Kick questionnaire    Fear of Current or Ex-Partner: No    Emotionally Abused: No    Physically Abused: No    Sexually Abused: No     Family History  Problem Relation Age of Onset   Glaucoma Mother    Diabetes Mother    Stomach cancer Maternal  Grandfather    Stroke Maternal Grandmother     ROS: [x]  Positive   [ ]  Negative   [ ]  All sytems reviewed and are negative  Cardiovascular: []  chest pain/pressure []  palpitations []  SOB lying flat []  DOE []  pain in legs while walking []  pain in legs at rest []  pain in legs at night []  non-healing ulcers []  hx of DVT []  swelling in legs  Pulmonary: []  productive cough []  asthma/wheezing []  home O2  Neurologic: []  weakness in []  arms []  legs []  numbness in []  arms []  legs []  hx of CVA []  mini stroke [] difficulty speaking or slurred speech []  temporary loss of vision in one eye []  dizziness  Hematologic: []  hx of cancer []  bleeding problems []  problems with blood clotting easily  Endocrine:   []  diabetes []  thyroid  disease  GI []  vomiting blood []  blood in stool  GU: []  CKD/renal failure []  HD--[]  M/W/F or []  T/T/S []  burning with urination []  blood in urine  Psychiatric: []  anxiety []  depression  Musculoskeletal: []  arthritis []  joint pain  Integumentary: []  rashes []  ulcers  Constitutional: []  fever []  chills   Physical Examination  Vitals:   12/27/23 0924 12/27/23 1130  BP:  (!) 199/101  Pulse:  100  Resp:  12  Temp: 98.1 F (36.7 C)   SpO2:  100%   Body mass index is 25.85 kg/m.  General:  WDWN in NAD Gait: Not observed HENT: WNL, normocephalic Pulmonary: normal non-labored breathing Cardiac: regular, without  Murmurs, rubs or gallops Abdomen:  PD catheter.  No rebound or guarding.  Generalized tenderness Vascular Exam/Pulses: Bilateral DP pulses palpable Extremities: without ischemic changes Musculoskeletal: no muscle wasting or atrophy  Neurologic: A&O X 3; Appropriate Affect ; SENSATION: normal; MOTOR FUNCTION:  moving all extremities equally. Speech is fluent/normal   CBC    Component Value Date/Time   WBC 6.7 12/27/2023 0543   RBC 3.15 (L) 12/27/2023 0543   HGB 8.1 (L) 12/27/2023 0543   HCT 26.4 (L) 12/27/2023 0543    PLT 426 (H) 12/27/2023 0543   MCV 83.8 12/27/2023 0543   MCH 25.7 (L) 12/27/2023 0543   MCHC 30.7 12/27/2023 0543   RDW 18.0 (H)  12/27/2023 0543   LYMPHSABS 1.1 12/19/2021 0741   MONOABS 0.4 12/19/2021 0741   EOSABS 0.1 12/19/2021 0741   BASOSABS 0.0 12/19/2021 0741    BMET    Component Value Date/Time   NA 137 12/27/2023 0543   K 3.4 (L) 12/27/2023 0543   CL 94 (L) 12/27/2023 0543   CO2 28 12/27/2023 0543   GLUCOSE 329 (H) 12/27/2023 0543   BUN 25 (H) 12/27/2023 0543   CREATININE 2.62 (H) 12/27/2023 0543   CALCIUM  8.0 (L) 12/27/2023 0543   GFRNONAA 25 (L) 12/27/2023 0543   GFRAA 47 (L) 10/28/2018 0912    COAGS: Lab Results  Component Value Date   INR 1.06 02/10/2015   INR 1.05 02/09/2015     Non-Invasive Vascular Imaging:    N/A   ASSESSMENT/PLAN: This is a 73 y.o. male  with history of stroke, ESRD, diabetes that presents with abdominal pain with vomiting and diarrhea and concern for peritonitis in setting of PD catheter.  This was previously placed by Dr. Edgardo Goodwill.  I discussed with Dr. Cindra Cree.  Nephrology is requesting to remove the PD catheter and place a [facility] and switch him to HD. Discussed plans for the OR on Monday for PD catheter removal and TDC at that time.  The CT scan shows significant peritoneal fluid but the catheter has not been drained today and this is going to be done by nursing.  I have recommended temporary catheter this weekend through IR if needed given the infectious workup with blood cultures that are pending.  Young Hensen, MD [facility] Office: [phone]  Young Hensen

## 2023-12-27 NOTE — ED Notes (Signed)
 CCMD called as pt does not show as added, according to CCMD they have been and will fix the issue.

## 2023-12-27 NOTE — ED Provider Notes (Signed)
 [place] EMERGENCY DEPARTMENT AT [facility] Provider Note   CSN: [id number] Arrival date & time: 12/27/23  5784     History  Chief Complaint  Patient presents with   Emesis   Diarrhea    Russell Austin is a 73 y.o. male.  The history is provided by the patient and the spouse.  Emesis Associated symptoms: diarrhea   Diarrhea Associated symptoms: vomiting   Russell Austin is a 73 y.o. male who presents to the Emergency Department complaining of abdominal pain vomiting and diarrhea.  He presents to the emergency department by EMS from home for evaluation of symptoms that started at 8:30 PM.  He complains of severe generalized abdominal pain with associated numerous episodes of vomiting and diarrhea.  He he lives at home with his wife and performs peritoneal dialysis every night.  For the last week he has been receiving ceftazidime through his PD catheter for 6 hours prior to him initiating dialysis.  He still has 2500 cc of fluid in his abdomen that needs to be removed.  He does follow with Dr. Cindra Cree with nephrology.  No associated fever.  No chest pain, difficulty breathing.  No hematemesis, melena or hematochezia.  He has been experiencing progressive bilateral lower extremity edema as well as progressive swelling in the right arm, worsening over the last few weeks.  He has been on peritoneal dialysis since September, never has had hemodialysis.     Home Medications Prior to Admission medications   Medication Sig Start Date End Date Taking? Authorizing Provider  amLODipine  (NORVASC ) 5 MG tablet Take 1 tablet (5 mg total) by mouth daily. 06/11/23   Nafziger, Randel Buss, NP  aspirin  EC 81 MG tablet Take 81 mg by mouth daily.    [provider]  atorvastatin  (LIPITOR) 10 MG tablet Take 1 tablet (10 mg total) by mouth every morning. 05/30/23   Nafziger, Randel Buss, NP  blood glucose meter kit and supplies KIT Dispense based on patient and insurance preference. Use up  to four times daily as directed. 08/21/22   Nafziger, Randel Buss, NP  buPROPion  (WELLBUTRIN  XL) 300 MG 24 hr tablet TAKE 1 TABLET (300 MG TOTAL) BY MOUTH EVERY MORNING. 11/26/23   Nafziger, Randel Buss, NP  calcitRIOL  (ROCALTROL ) 0.25 MCG capsule Take 0.25 mcg by mouth daily. 01/25/23   [provider]  chlorproMAZINE  (THORAZINE ) 25 MG tablet TAKE 1 TABLET BY MOUTH THREE TIMES A DAY 11/21/23   Nafziger, Randel Buss, NP  chlorthalidone (HYGROTON) 25 MG tablet Take 25 mg by mouth daily.    [provider]  Continuous Blood Gluc Receiver (FREESTYLE LIBRE 14 DAY READER) DEVI Use with freestyle libre system 06/26/22   Nafziger, Randel Buss, NP  Continuous Glucose Sensor (FREESTYLE LIBRE 3 PLUS SENSOR) MISC Change sensor every 15 days. 07/09/23   Nafziger, Randel Buss, NP  Finerenone (KERENDIA) 10 MG TABS Take 10 mg by mouth daily.    [provider]  gabapentin  (NEURONTIN ) 300 MG capsule TAKE ONE CAPSULE BY MOUTH EVERYDAY AT BEDTIME 05/30/23   Nafziger, Randel Buss, NP  gentamicin  cream (GARAMYCIN ) 0.1 % Apply topically daily. 04/23/23   [provider]  glipiZIDE  (GLUCOTROL  XL) 5 MG 24 hr tablet TAKE 1 TABLET BY MOUTH TWICE A DAY 11/19/23   Nafziger, Randel Buss, NP  insulin  aspart (NOVOLOG  FLEXPEN) 100 UNIT/ML FlexPen Inject 5 Units into the skin 3 (three) times daily with meals. Patient taking differently: Inject 8 Units into the skin 2 (two) times daily before lunch and supper. 06/26/23  Nafziger, Randel Buss, NP  insulin  glargine (LANTUS  SOLOSTAR) 100 UNIT/ML Solostar Pen INJECT 20 UNITS INTO THE SKIN DAILY 12/05/23   Nafziger, Randel Buss, NP  Insulin  Pen Needle (B-D ULTRAFINE III SHORT PEN) 31G X 8 MM MISC Use to inject insulin  4 times per day as instructed 10/02/23   Nafziger, Randel Buss, NP  lactulose (CHRONULAC) 10 GM/15ML solution Take by mouth. 05/15/23   [provider]  Lancets [facility] DELICA PLUS [place]) MISC Apply topically. 08/23/22   [provider]  losartan  (COZAAR ) 100 MG tablet TAKE 1 TABLET BY MOUTH  EVERY DAY IN THE MORNING 11/26/23   Nafziger, Randel Buss, NP  Methoxy PEG-Epoetin Beta (MIRCERA IJ) Inject into the skin. 08/22/23   [provider]  metoCLOPramide  (REGLAN ) 10 MG tablet TAKE 1 TABLET BY MOUTH 4 TIMES DAILY. 12/05/23   Nafziger, Randel Buss, NP  metoprolol  tartrate (LOPRESSOR ) 50 MG tablet Take 50 mg by mouth 2 (two) times daily. 09/07/23   [provider]  ondansetron  (ZOFRAN -ODT) 4 MG disintegrating tablet Take 1 tablet (4 mg total) by mouth every 8 (eight) hours as needed for nausea or vomiting. 09/16/23   Carin Charleston, MD  [facility] ULTRA test strip USE TO check blood glucose UP TO four times daily AS DIRECTED 08/23/22   [provider]  pantoprazole  (PROTONIX ) 40 MG tablet TAKE 1 TABLET BY MOUTH EVERY DAY 12/05/23   Nafziger, Randel Buss, NP  potassium chloride  (KLOR-CON ) 10 MEQ tablet Take by mouth. 07/25/23   [provider]  tiZANidine  (ZANAFLEX ) 2 MG tablet TAKE ONE TABLET BY MOUTH EVERYDAY AT BEDTIME 05/30/23   Nafziger, Randel Buss, NP      Allergies    Lactose intolerance (gi), Apple pectin [pectin], Lactose, Metformin  and related, Peach [prunus persica], and Morphine  and codeine    Review of Systems   Review of Systems  Gastrointestinal:  Positive for diarrhea and vomiting.  All other systems reviewed and are negative.   Physical Exam Updated Vital Signs BP (!) 187/96   Pulse 91   Temp 98.2 F (36.8 C) (Oral)   Resp 16   Wt 79.4 kg   SpO2 100%   BMI 25.85 kg/m  Physical Exam Vitals and nursing note reviewed.  Constitutional:      Appearance: He is well-developed.  HENT:     Head: Normocephalic and atraumatic.  Cardiovascular:     Rate and Rhythm: Normal rate and regular rhythm.  Pulmonary:     Effort: Pulmonary effort is normal.     Breath sounds: Normal breath sounds.  Abdominal:     Palpations: Abdomen is soft.     Tenderness: There is no guarding or rebound.     Comments: Mild generalized abdominal tenderness.  PD catheter in place.   Musculoskeletal:        General: No tenderness.     Comments: There is firm edema to bilateral lower extremities, right greater than left.  There is mild nonpitting edema to the right upper extremity.  Skin:    General: Skin is warm and dry.  Neurological:     Mental Status: He is alert and oriented to person, place, and time.  Psychiatric:        Behavior: Behavior normal.     ED Results / Procedures / Treatments   Labs (all labs ordered are listed, but only abnormal results are displayed) Labs Reviewed  COMPREHENSIVE METABOLIC PANEL WITH GFR - Abnormal; Notable for the following components:      Result Value   Potassium 3.4 (*)  Chloride 94 (*)    Glucose, Bld 329 (*)    BUN 25 (*)    Creatinine, Ser 2.62 (*)    Calcium  8.0 (*)    Albumin  1.9 (*)    AST 45 (*)    GFR, Estimated 25 (*)    All other components within normal limits  CBC - Abnormal; Notable for the following components:   RBC 3.15 (*)    Hemoglobin 8.1 (*)    HCT 26.4 (*)    MCH 25.7 (*)    RDW 18.0 (*)    Platelets 426 (*)    All other components within normal limits  CBG MONITORING, ED - Abnormal; Notable for the following components:   Glucose-Capillary 272 (*)    All other components within normal limits  CULTURE, BLOOD (ROUTINE X 2)  CULTURE, BLOOD (ROUTINE X 2)  LIPASE, BLOOD  URINALYSIS, ROUTINE W REFLEX MICROSCOPIC  I-STAT CG4 LACTIC ACID, ED    EKG EKG Interpretation Date/Time:  Friday December 27 2023 05:32:29 EDT Ventricular Rate:  90 PR Interval:  136 QRS Duration:  77 QT Interval:  392 QTC Calculation: 480 R Axis:   20  Text Interpretation: Sinus rhythm Abnormal R-wave progression, early transition Borderline prolonged QT interval Artifact in lead(s) I II aVR aVL aVF V1 Confirmed by Kelsey Patricia [id number]) on 12/27/2023 5:47:11 AM  Radiology CT ABDOMEN PELVIS WO CONTRAST Result Date: 12/27/2023 CLINICAL DATA:  Acute, nonlocalized abdominal pain EXAM: CT ABDOMEN AND PELVIS WITHOUT  CONTRAST TECHNIQUE: Multidetector CT imaging of the abdomen and pelvis was performed following the standard protocol without IV contrast. RADIATION DOSE REDUCTION: This exam was performed according to the departmental dose-optimization program which includes automated exposure control, adjustment of the mA and/or kV according to patient size and/or use of iterative reconstruction technique. COMPARISON:  09/16/2023 FINDINGS: Lower chest: Symmetric gynecomastia. Small right more than left pleural effusion with atelectasis. There is circumferential thickening of the lower esophagus, chronic based on 2024 CT. Hepatobiliary: Scalloping of the right liver margin related 2 peritoneal based fluid. No liver mass.No evidence of biliary obstruction or stone. Pancreas: Unremarkable. Spleen: Unremarkable. Adrenals/Urinary Tract: Negative adrenals. No hydronephrosis or stone. Unremarkable bladder. Stomach/Bowel:  No obstruction. No visible bowel inflammation Vascular/Lymphatic: No acute vascular abnormality. No mass or adenopathy. Reproductive:No pathologic findings. Other: Peritoneal dialysis catheter in the lower ventral midline. Peritoneal fluid is large volume and loculated appearing mainly in the ventral abdomen with tracking superior along the right aspect of the liver where there is scalloping, cannot exclude subcapsular component at this level. Bubbles of anti dependent gas presumably introduced through the catheter. The dominant central loculation is 22 cm in transverse span and 8 cm in thickness. Musculoskeletal: No acute abnormalities. Bulky spondylitic spurring with multilevel lower thoracic ankylosis. IMPRESSION: Peritoneal dialysis with loculated peritoneal fluid collections in the ventral abdomen and tracking along the right liver where there could be subcapsular involvement given the degree of right liver scalloping. Correlate for infected dialysate. Small volume pleural fluid with atelectasis at the right more  than left base. Electronically Signed   By: Ronnette Coke M.D.   On: 12/27/2023 06:56    Procedures Procedures    Medications Ordered in ED Medications  ondansetron  (ZOFRAN ) injection 4 mg (4 mg Intravenous Given 12/27/23 2440)    ED Course/ Medical Decision Making/ A&P  Medical Decision Making Amount and/or Complexity of Data Reviewed Labs: ordered. Radiology: ordered.  Risk Prescription drug management. Decision regarding hospitalization.   Patient with ESRD on peritoneal dialysis here for evaluation of abdominal pain, vomiting and diarrhea.  He has mild tenderness on examination.  He did recently start indwelling antibiotics for peritonitis.  CBC with mild anemia, slightly downtrending when compared to priors-no reported hematemesis, hematochezia or melena.  CMP is stable when compared to priors.  CT abdomen pelvis was obtained, which demonstrates loculated peritoneal fluid collection.  He does have dialysate still indwelling.  Given the loculated collection discussed with Dr. Edson Graces with nephrology-recommendation for medicine admission.  Nephrology will see the patient in consult.  Patient and wife updated findings of studies and recommendation for admission and they are in agreement with plan.        Final Clinical Impression(s) / ED Diagnoses Final diagnoses:  Peritonitis [facility])    Rx / DC Orders ED Discharge Orders     None         Kelsey Patricia, MD 12/27/23 [phone]

## 2023-12-27 NOTE — Progress Notes (Signed)
 Pharmacy Antibiotic Note  Russell Austin is a 73 y.o. male admitted on 12/27/2023 with  peritonitis .  Pharmacy has been consulted for cefepime  and vancomycin  dosing. Patient has been on PD, but now being transitioned to HD. Nephrology note from 4/25 has recent C/S screenshots from outside facility. Unsure of dialysis schedule, will load and redose with treatments.   Plan: Cefepime  1G IV q PM Vancomycin  1500mg  IV x1 then redose with HD as needed Monitor cultures and levels as appropriate  Weight: 79.4 kg (175 lb 0.7 oz)  Temp (24hrs), Avg:98.2 F (36.8 C), Min:98.1 F (36.7 C), Max:98.2 F (36.8 C)  Recent Labs  Lab 12/27/23 0543 12/27/23 0759 12/27/23 1038  WBC 6.7  --   --   CREATININE 2.62*  --   --   LATICACIDVEN  --  1.9 2.1*    Estimated Creatinine Clearance: 25.5 mL/min (A) (by C-G formula based on SCr of 2.62 mg/dL (H)).    Allergies  Allergen Reactions   Lactose Intolerance (Gi) Diarrhea   Apple Pectin [Pectin] Itching    ITCHY THROAT   Metformin  And Related Other (See Comments)    D/t decreased kidney function    Peach [Prunus Persica] Itching    ITCHY THROAT   Morphine  And Codeine Anxiety    Jittery    Thank you for allowing pharmacy to be a part of this patient's care.  Russell Austin 12/27/2023 12:51 PM

## 2023-12-27 NOTE — ED Notes (Signed)
 Pt nauseous and projectile vomiting.

## 2023-12-27 NOTE — ED Notes (Signed)
IR at bedside

## 2023-12-27 NOTE — ED Notes (Signed)
 IR at bedside x2. Desmopressin  ordered.

## 2023-12-27 NOTE — Procedures (Signed)
 Order for lab sample and drain of contents of the PD sample completed. The PD fluid was bright red. Unable to determine the presence of any fibrin. Approximately 2500 ml of effluent drained.  No fluid was filled. Patient showed no signs or symptoms of distress with the process. Wife at bedside and observed the appearance of the effluent. Specimen taken directly to the lab.

## 2023-12-27 NOTE — Procedures (Signed)
 Interventional Radiology Procedure Note  Procedure: LT internal jugular TEMP TRIALYSIS CATH  Complications: None  Estimated Blood Loss: MIN  Findings: TIP SVCRA  MT Aidel Davisson, MD

## 2023-12-27 NOTE — Progress Notes (Signed)
 Dressing saturated with blood. Old dressing removed. Once removed, it appears that saturation occurred from large clot under dressing. No active bleeding noted. New dressing applied. Pt tolerated well. Unit RN advised that if bleeding continues, IR will need to be notified.

## 2023-12-27 NOTE — Progress Notes (Signed)
 Patient ID: Russell Austin, male   DOB: 21-Dec-1950, 73 y.o.   MRN: [id number]  Patient had persistent bleeding from the left jugular catheter site.  This area was prepped in sterile fashion and skin around the catheter was injected with 1% Lidocaine  with epinephrine .  Pursestring suture was placed around the catheter and new dressing was placed.

## 2023-12-27 NOTE — Consult Note (Signed)
 ESRD Consult Note  Requesting provider: [name] Service requesting consult: Hospitalist Reason for consult: ESRD, provision of dialysis Indication for acute dialysis?: End Stage Renal Disease  Outpatient dialysis unit: Home Outpatient dialysis prescription: PD  Assessment/Recommendations: Russell Austin is a/an 73 y.o. male with a past medical history notable for ESRD on HD admitted with complicated peritonitis  # ESRD: Has been on PD but will need to switch to HD now and likely needs PD cath removed not only from infection perspective but failure to comply with appropriate protocol. Patient and family aware. Will need hemodialysis given volume excess. Discussed with VVS who can take PD cath and place [facility] on Monday but will ask IR to help place temp cath today so we can perform HD over the weekend and possibly tonight. Will discuss with SW next week for CLIP to in center. This will pend on SNF placement as well  # Volume/ hypertension: Dry weight around 75 kg.  Over dry weight at this time likely due to ultrafiltration failure in the setting of peritonitis.  Plan to switch to hemodialysis and ultrafiltration as tolerated.  Will provide IV Lasix  as well and recommend continuing home BP meds (was only on losartan  100mg  daily)  # Anemia of Chronic Kidney Disease: Hemoglobin 8.1. Avoid iron given infection. Will plan to dose ESA with HD  # Secondary Hyperparathyroidism/Hyperphosphatemia: continue home calcitriol  0.5mcg daily. Will obtain phos level  # Vascular access: will need PD cath removed and then TDC vs temp cath.   # Loculated Peritonitis: polymicrobial with enterococcus, proteus, and kleb pneumo. This raises concern for some possible intestinal tear. Will cover with vanc and cefepime ; given previous regimen did not cover all bacteria appropriately we will have to start clock now. Appreciate help from VVS and possibly gen surg if needed.  # N/v/d: treat symptomatically per  primary. Likely related to peritonitis    # Additional recommendations: - Dose all meds for creatinine clearance < 10 ml/min  - Unless absolutely necessary, no MRIs with gadolinium.  - Implement save arm precautions.  Prefer needle sticks in the dorsum of the hands or wrists.  No blood pressure measurements in arm. - If blood transfusion is requested during hemodialysis sessions, please alert us  prior to the session.  - Use synthetic opioids (Fentanyl /Dilaudid ) if needed  Recommendations were discussed with the primary team.   History of Present Illness: Russell Austin is a/an 73 y.o. male with a past medical history of ESRD who presents with n/v/d.  Patient presented to the hospital today for sudden onset severe diarrhea as well as nausea and vomiting.  The patient is my dialysis patient.  He has been on PD since around September 2024.  Some complications have included his difficulty tolerating a stringent schedule and completing a fairly long dialysis overnight.  We have talked about different options of switching to hemodialysis or stopping dialysis entirely because of some of his significant comorbid conditions but the patient has wished to stay on PD.  Over the past couple months the patient has had episodes of peritonitis.  In March she had peritonitis with Enterococcus faecalis.  He was treated successfully with vancomycin  and had repeat fluid cultures that showed resolution.  It was felt that this infection was related to aseptic technique when disconnecting from the machine.  His wife helps him a lot with dialysis but the patient also wants to maintain some independence and often will disconnect himself without cleaning his hands or wearing a mask.  We counseled the patient on this and provided reeducation.  Despite this the patient had worsening symptoms of peritonitis again around the 15th of this month.  The wife reported that the patient continued to disconnect himself from the  machine without following appropriate protocol.  The wife was in the process of having the patient's start intermittent hemodialysis and be admitted to a skilled nursing facility for rehabilitation.  The patient was started on cefepime  and vancomycin  initially and then switched to ceftazidime when cultures initially came back with GNRs. Unfortunately the final culture results were not reported to me but they are reported below. They have continued with PD at home without significant issues.  Abdominal pain has improved.  Fluid has cleared up.  Then last night the patient started having significant diarrhea as well as nausea vomiting with multiple episodes.  They called on-call nephrology who recommended coming to the hospital.  In the hospital labs were overall acceptable but imaging demonstrated loculated peritoneal fluid collections tracking into the liver with capsular involvement.  When I saw the patient he was very tired.  He denied fevers, chills, chest pain, dizziness, lightheadedness.  Continued to have some nausea.  No more episodes of vomiting.  PD fluid cultures included below     Medications:  Current Facility-Administered Medications  Medication Dose Route Frequency Provider Last Rate Last Admin   acetaminophen  (TYLENOL ) tablet 650 mg  650 mg Oral Q6H PRN Smith, Rondell A, MD       Or   acetaminophen  (TYLENOL ) suppository 650 mg  650 mg Rectal Q6H PRN Felipe Horton, Rondell A, MD       albuterol (PROVENTIL) (2.5 MG/3ML) 0.083% nebulizer solution 2.5 mg  2.5 mg Nebulization Q6H PRN Smith, Rondell A, MD       ondansetron  (ZOFRAN ) tablet 4 mg  4 mg Oral Q6H PRN Smith, Rondell A, MD       Or   ondansetron  (ZOFRAN ) injection 4 mg  4 mg Intravenous Q6H PRN Smith, Rondell A, MD       sodium chloride  flush (NS) 0.9 % injection 3 mL  3 mL Intravenous Q12H Smith, Rondell A, MD       Current Outpatient Medications  Medication Sig Dispense Refill   amLODipine  (NORVASC ) 5 MG tablet Take 1 tablet (5  mg total) by mouth daily. 90 tablet 3   aspirin  EC 81 MG tablet Take 81 mg by mouth daily.     atorvastatin  (LIPITOR) 10 MG tablet Take 1 tablet (10 mg total) by mouth every morning. 90 tablet 3   buPROPion  (WELLBUTRIN  XL) 300 MG 24 hr tablet TAKE 1 TABLET (300 MG TOTAL) BY MOUTH EVERY MORNING. 90 tablet 1   chlorproMAZINE  (THORAZINE ) 25 MG tablet TAKE 1 TABLET BY MOUTH THREE TIMES A DAY (Patient taking differently: Take 25 mg by mouth daily as needed for hiccoughs.) 270 tablet 1   gabapentin  (NEURONTIN ) 300 MG capsule TAKE ONE CAPSULE BY MOUTH EVERYDAY AT BEDTIME 90 capsule 3   gentamicin  cream (GARAMYCIN ) 0.1 % Apply 1 Application topically daily.     glipiZIDE  (GLUCOTROL  XL) 5 MG 24 hr tablet TAKE 1 TABLET BY MOUTH TWICE A DAY 180 tablet 1   insulin  aspart (NOVOLOG  FLEXPEN) 100 UNIT/ML FlexPen Inject 5 Units into the skin 3 (three) times daily with meals. (Patient taking differently: Inject 8 Units into the skin daily.) 15 mL 1   insulin  glargine (LANTUS  SOLOSTAR) 100 UNIT/ML Solostar Pen INJECT 20 UNITS INTO THE SKIN DAILY (Patient taking differently: Inject  18 Units into the skin every evening.) 15 mL 0   lactulose (CHRONULAC) 10 GM/15ML solution Take by mouth daily as needed for mild constipation or moderate constipation.     losartan  (COZAAR ) 100 MG tablet TAKE 1 TABLET BY MOUTH EVERY DAY IN THE MORNING 90 tablet 1   metoprolol  tartrate (LOPRESSOR ) 50 MG tablet Take 50 mg by mouth 2 (two) times daily.     pantoprazole  (PROTONIX ) 40 MG tablet TAKE 1 TABLET BY MOUTH EVERY DAY 90 tablet 0   calcitRIOL  (ROCALTROL ) 0.25 MCG capsule Take 0.25 mcg by mouth daily. (Patient not taking: Reported on 12/27/2023)     chlorthalidone (HYGROTON) 25 MG tablet Take 25 mg by mouth daily. (Patient not taking: Reported on 12/27/2023)     Continuous Glucose Sensor (FREESTYLE LIBRE 3 PLUS SENSOR) MISC Change sensor every 15 days. 6 each 3   Insulin  Pen Needle (B-D ULTRAFINE III SHORT PEN) 31G X 8 MM MISC Use to  inject insulin  4 times per day as instructed 100 each 11   Methoxy PEG-Epoetin Beta (MIRCERA IJ) Inject into the skin.     metoCLOPramide  (REGLAN ) 10 MG tablet TAKE 1 TABLET BY MOUTH 4 TIMES DAILY. (Patient taking differently: Take 10 mg by mouth daily as needed for nausea or vomiting.) 120 tablet 0   ondansetron  (ZOFRAN -ODT) 4 MG disintegrating tablet Take 1 tablet (4 mg total) by mouth every 8 (eight) hours as needed for nausea or vomiting. (Patient not taking: Reported on 12/27/2023) 20 tablet 0   potassium chloride  (KLOR-CON ) 10 MEQ tablet Take by mouth. (Patient not taking: Reported on 12/27/2023)     tiZANidine  (ZANAFLEX ) 2 MG tablet TAKE ONE TABLET BY MOUTH EVERYDAY AT BEDTIME (Patient not taking: Reported on 12/27/2023) 90 tablet 3   torsemide (DEMADEX) 20 MG tablet Take 80 mg by mouth daily. 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   Chronic kidney disease    Diabetes mellitus type II    Diabetic retinopathy FOLLOWED BY DR Seward Dao   ED (erectile dysfunction)    Glaucoma of both eyes    Hypertension    Left hydrocele    Stroke [facility])      SOCIAL HISTORY Social History   Socioeconomic History   Marital status: Married    Spouse name: Not on file   Number of children: 1   Years of education: 12   Highest education level: 12th grade  Occupational History   Occupation: Disabled  Tobacco Use   Smoking status: Never   Smokeless tobacco: Never  Vaping Use   Vaping status: Never Used  Substance and Sexual Activity   Alcohol use: No   Drug use: No   Sexual activity: Not on file  Other Topics Concern   Not on file  Social History Narrative   Lives at home with his wife and granddaughter.   Left-handed.   3 cups  caffeine per day.   Social Drivers of Corporate investment banker Strain: Low Risk  (01/21/2023)   Overall Financial Resource Strain (CARDIA)    Difficulty of Paying Living Expenses: Not hard at all  Food Insecurity: No Food Insecurity (01/21/2023)   Hunger Vital Sign    Worried About Running Out of Food in the  Last Year: Never true    Ran Out of Food in the Last Year: Never true  Transportation Needs: No Transportation Needs (01/21/2023)   PRAPARE - Administrator, Civil Service (Medical): No    Lack of Transportation (Non-Medical): No  Physical Activity: Inactive (01/21/2023)   Exercise Vital Sign    Days of Exercise per Week: 0 days    Minutes of Exercise per Session: 0 min  Stress: No Stress Concern Present (01/21/2023)   [employer] of Occupational Health - Occupational Stress Questionnaire    Feeling of Stress : Not at all  Social Connections: Socially Integrated (01/21/2023)   Social Connection and Isolation Panel [NHANES]    Frequency of Communication with Friends and Family: More than three times a week    Frequency of Social Gatherings with Friends and Family: More than three times a week    Attends Religious Services: More than 4 times per year    Active Member of [employer] or Organizations: Yes    Attends Engineer, structural: More than 4 times per year    Marital Status: Married  Catering manager Violence: Not At Risk (01/21/2023)   Humiliation, Afraid, Rape, and Kick questionnaire    Fear of Current or Ex-Partner: No    Emotionally Abused: No    Physically Abused: No    Sexually Abused: No     FAMILY HISTORY Family History  Problem Relation Age of Onset   Glaucoma Mother    Diabetes Mother    Stomach cancer Maternal Grandfather    Stroke Maternal Grandmother      Review of Systems: 12 systems were reviewed and negative except per HPI  Physical Exam: Vitals:   12/27/23 0730 12/27/23 0924  BP: (!) 191/96   Pulse: 92   Resp: 18   Temp:   98.1 F (36.7 C)  SpO2: 97%    No intake/output data recorded. No intake or output data in the 24 hours ending 12/27/23 1017 General: I'll appearing, lying in bed, nad HEENT: anicteric sclera, MMM CV: normal rate, no murmurs, 1+ LEE Lungs: bilateral chest rise, normal wob Abd: soft, non-tender, moderate distention, mild tenderness to palpation Skin: no visible lesions or rashes Psych: alert, engaged, appropriate mood and affect Neuro: tired and lets wife speak for him but normal speech, no gross focal deficits   Test Results Reviewed Lab Results  Component Value Date   NA 137 12/27/2023   K 3.4 (L) 12/27/2023   CL 94 (L) 12/27/2023   CO2 28 12/27/2023   BUN 25 (H) 12/27/2023   CREATININE 2.62 (H) 12/27/2023   GFR 10.52 (LL) 02/28/2023   CALCIUM  8.0 (L) 12/27/2023   ALBUMIN  1.9 (L) 12/27/2023    I have reviewed relevant outside healthcare records

## 2023-12-27 NOTE — ED Notes (Signed)
 HD cath bleeding profusely at site. IR notified.

## 2023-12-27 NOTE — H&P (Signed)
 History and Physical    Patient: Russell Austin:[id number] DOB: 06-Nov-1950 DOA: 12/27/2023 DOS: the patient was seen and examined on 12/27/2023 PCP: Alto Atta, NP  Patient coming from: Home  Chief Complaint:  Chief Complaint  Patient presents with   Emesis   Diarrhea   HPI: Russell Austin is a 73 y.o. male with medical history significant of hypertension, hyperlipidemia, ESRD on PD, CVA, legally blind, presents with with abdominal pain and inability to complete dialysis.  He has been experiencing sharp, constant abdominal pain since yesterday, located in the lower abdomen near his catheter, with no radiation. He has been vomiting, but he has sweats and hiccups.  He has been unable to complete his peritoneal dialysis since last night due to the pain. He has been receiving antibiotics for peritonitis after cultures from hemodialysis last Thursday grew out many gram-negative coccobacilli and rare gram-positive cocci in pairs and clusters.  In the emergency department patient was noted to be afebrile with blood pressures elevated up to 197/102, and O2 saturations maintained on room air.  Labs significant for hemoglobin 8.1, platelets 426, lactic acid 1.9, potassium 3.4, BUN 25, creatinine 2.62, glucose 329, anion gap 15, albumin  1.9, AST 45, and lactic acid 1.9.  CT scan of abdomen pelvis revealed peritoneal dialysis with loculated peritoneal fluid collections in the ventral abdomen and tracking along the right liver where there could be a capsular involvement given the degree of right liver scalloping.  Nephrology have been formally consulted.  Review of Systems: As mentioned in the history of present illness. All other systems reviewed and are negative. 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   Chronic kidney disease    Diabetes mellitus type II    Diabetic retinopathy FOLLOWED BY DR Seward Dao   ED  (erectile dysfunction)    Glaucoma of both eyes    Hypertension    Left hydrocele    Stroke [facility])    Past Surgical History:  Procedure Laterality Date   APPENDECTOMY  AGE EARLY 20'S   CAPD INSERTION N/A 04/11/2023   Procedure: LAPAROSCOPIC INSERTION CONTINUOUS AMBULATORY PERITONEAL DIALYSIS  (CAPD) CATHETER WITH  OMENTOPEXY;  Surgeon: Carlene Che, MD;  Location: [facility] OR;  Service: Vascular;  Laterality: N/A;   COLONOSCOPY  12/30/2020   every 5 years   HYDROCELE EXCISION  03/31/2012   Procedure: HYDROCELECTOMY ADULT;  Surgeon: Trent Frizzle, MD;  [facility];  Service: Urology;  Laterality: Left;  45 MINS     LEFT EYE LASER RETINA REPAIR  SEPT 2012   RIGHT EYE VITRECTOMY/ INSERTION GLAUCOMA SETON/ LASER REPAIR  12-13-2008   RETINAL ARTERY OCCLUSION /NEOVASCULAR GLAUCOMA/ HEMORRHAGE   RIGHT EYE VITRETOMY/ INSERTION GLAUCOMA SETON X2/ LASER  03-24-2009   RECURRENT HEMORRHAGE/ OCCLUSION INTERNAL SETON   SHOULDER ARTHROSCOPY Right 2005   undescended right testicle removed  1994   Social History:  reports that he has never smoked. He has never used smokeless tobacco. He reports that he does not drink alcohol and does not use drugs.  Allergies  Allergen Reactions   Lactose Intolerance (Gi) Diarrhea   Apple Pectin [Pectin] Itching    ITCHY THROAT   Metformin  And Related Other (See Comments)    D/t decreased kidney function    Peach [Prunus Persica] Itching    ITCHY THROAT   Morphine  And Codeine Anxiety    Jittery    Family History  Problem  Relation Age of Onset   Glaucoma Mother    Diabetes Mother    Stomach cancer Maternal Grandfather    Stroke Maternal Grandmother     Prior to Admission medications   Medication Sig Start Date End Date Taking? Authorizing Provider  torsemide (DEMADEX) 20 MG tablet Take 80 mg by mouth daily. 10/17/23  Yes [provider]  amLODipine  (NORVASC ) 5 MG tablet Take 1 tablet (5 mg total) by mouth daily. 06/11/23    Nafziger, Randel Buss, NP  aspirin  EC 81 MG tablet Take 81 mg by mouth daily.    [provider]  atorvastatin  (LIPITOR) 10 MG tablet Take 1 tablet (10 mg total) by mouth every morning. 05/30/23   Nafziger, Randel Buss, NP  buPROPion  (WELLBUTRIN  XL) 300 MG 24 hr tablet TAKE 1 TABLET (300 MG TOTAL) BY MOUTH EVERY MORNING. 11/26/23   Nafziger, Randel Buss, NP  calcitRIOL  (ROCALTROL ) 0.25 MCG capsule Take 0.25 mcg by mouth daily. 01/25/23   [provider]  chlorproMAZINE  (THORAZINE ) 25 MG tablet TAKE 1 TABLET BY MOUTH THREE TIMES A DAY 11/21/23   Nafziger, Randel Buss, NP  chlorthalidone (HYGROTON) 25 MG tablet Take 25 mg by mouth daily.    [provider]  Continuous Glucose Sensor (FREESTYLE LIBRE 3 PLUS SENSOR) MISC Change sensor every 15 days. 07/09/23   Nafziger, Randel Buss, NP  Finerenone (KERENDIA) 10 MG TABS Take 10 mg by mouth daily.    [provider]  gabapentin  (NEURONTIN ) 300 MG capsule TAKE ONE CAPSULE BY MOUTH EVERYDAY AT BEDTIME 05/30/23   Nafziger, Randel Buss, NP  gentamicin  cream (GARAMYCIN ) 0.1 % Apply topically daily. 04/23/23   [provider]  glipiZIDE  (GLUCOTROL  XL) 5 MG 24 hr tablet TAKE 1 TABLET BY MOUTH TWICE A DAY 11/19/23   Nafziger, Randel Buss, NP  insulin  aspart (NOVOLOG  FLEXPEN) 100 UNIT/ML FlexPen Inject 5 Units into the skin 3 (three) times daily with meals. Patient taking differently: Inject 8 Units into the skin 2 (two) times daily before lunch and supper. 06/26/23   Nafziger, Randel Buss, NP  insulin  glargine (LANTUS  SOLOSTAR) 100 UNIT/ML Solostar Pen INJECT 20 UNITS INTO THE SKIN DAILY 12/05/23   Nafziger, Randel Buss, NP  Insulin  Pen Needle (B-D ULTRAFINE III SHORT PEN) 31G X 8 MM MISC Use to inject insulin  4 times per day as instructed 10/02/23   Nafziger, Randel Buss, NP  lactulose (CHRONULAC) 10 GM/15ML solution Take by mouth. 05/15/23   [provider]  losartan  (COZAAR ) 100 MG tablet TAKE 1 TABLET BY MOUTH EVERY DAY IN THE MORNING 11/26/23   Nafziger, Randel Buss, NP  Methoxy PEG-Epoetin  Beta (MIRCERA IJ) Inject into the skin. 08/22/23   [provider]  metoCLOPramide  (REGLAN ) 10 MG tablet TAKE 1 TABLET BY MOUTH 4 TIMES DAILY. 12/05/23   Nafziger, Randel Buss, NP  metoprolol  tartrate (LOPRESSOR ) 50 MG tablet Take 50 mg by mouth 2 (two) times daily. 09/07/23   [provider]  ondansetron  (ZOFRAN -ODT) 4 MG disintegrating tablet Take 1 tablet (4 mg total) by mouth every 8 (eight) hours as needed for nausea or vomiting. 09/16/23   Carin Charleston, MD  pantoprazole  (PROTONIX ) 40 MG tablet TAKE 1 TABLET BY MOUTH EVERY DAY 12/05/23   Nafziger, Randel Buss, NP  potassium chloride  (KLOR-CON ) 10 MEQ tablet Take by mouth. 07/25/23   [provider]  tiZANidine  (ZANAFLEX ) 2 MG tablet TAKE ONE TABLET BY MOUTH EVERYDAY AT BEDTIME 05/30/23   Alto Atta, NP    Physical Exam: Vitals:   12/27/23 0521 12/27/23 0531 12/27/23 0600  BP: (!) 181/105  Aaron Aas)  187/96  Pulse: 92  91  Resp: (!) 22  16  Temp: 98.2 F (36.8 C)    TempSrc: Oral    SpO2: 99%  100%  Weight:  79.4 kg     Constitutional: Elderly male appears acutely ill and trying to retch. eyes: PERRL, lids and conjunctivae normal ENMT: Mucous membranes are moist.  Normal dentition.  Neck: normal, supple, no masses, no thyromegaly Respiratory: clear to auscultation bilaterally, no wheezing, no crackles. Normal respiratory effort. No accessory muscle use.  Cardiovascular: Regular rate and rhythm, no murmurs / rubs / gallops.  At least +1 pitting bilateral lower extremity edema Abdomen: Generalized tenderness palpation of the lower midline abdomen.  PD catheter in place. Musculoskeletal: no clubbing / cyanosis. No joint deformity upper and lower extremities. Good ROM, no contractures. Normal muscle tone.  Skin: Venous stasis changes of the bilateral lower extremities. Neurologic: CN 2-12 grossly intact. Sensation intact, DTR normal. Strength 5/5 in all 4.  Psychiatric: Normal judgment and insight. Alert and oriented x 3. Normal  mood.   Data Reviewed:    EKG 90 sinus rhythm with borderline prolonged QT at 480.  Reviewed labs, imaging, and pertinent records as documented.  Assessment and Plan:  Peritonitis Patient presents with complaints of abdominal pain with nausea and vomiting since yesterday.  Cultures off of his hemodialysis catheter have been obtained 4/15 and positive for many gram-negative coccobacilli and rare gram-positive cocci in pairs and clusters.  CT scan of abdomen pelvis revealed peritoneal dialysis with loculated peritoneal fluid collections in the ventral abdomen and tracking along the right liver where there could be a capsular involvement given the degree of right liver scalloping blood cultures have been ordered.  Patient had been started on empiric antibiotics of vancomycin  and cefepime .  Nephrology had been consulted placed orders for hemodialysis hopefully in a.m. after temporary catheter to be placed by IR.  Vascular surgery also consulted and plan on possibly taking out PD catheter on 4/28. - Admit to a telemetry bed - Follow-up blood cultures - Continue empiric vancomycin  and cefepime  - Fentanyl  IV as needed for pain - Antiemetics as needed - Appreciate vascular surgery,  will follow-up for any further recommendations  Hypertensive urgency Blood pressures noted to be elevated up to 199/101 on admission. - Resume home blood pressure regimen - Hydralazine IV as needed  ESRD on peritoneal dialysis Patient was unable to dialyze yesterday evening due to symptoms.  Labs revealed potassium 3.4, CO2 28, BUN 25, creatinine 2.62, anion gap 15.  Nephrology consulted and placed orders for hemodialysis hopefully tomorrow as well as IR consult for temporary hemodialysis catheter. -Furosemide  160 mg IV 6 hours as recommended by nephrology -Appreciate nephrology consultative services  Normocytic anemia Acute on chronic.  Hemoglobin noted to be 8.1 which appears to be trending down previously 9.5 in  09/2023.  Plan is for ESA with hemodialysis. - Monitor H&H.  Transfuse blood products as needed medically appropriate  Controlled diabetes mellitus type 2 with hyperglycemia, with long-term use of insulin  On admission glucose noted to be elevated at 329.  Last available hemoglobin A1c was 6.8 when checked on 12/10/2023. - Hypoglycemic protocols - Hold glipizide  - Pharmacy substitution of Semglee  18 units - CBGs before every meal with moderate SSI - Adjust insulin  regimen as needed  Hyperlipidemia - Continue atorvastatin   Legally blind   DVT prophylaxis: heparin  Advance Care Planning:   Code Status: Full Code   Consults: Vascular surgery, IR,  Family Communication: Wife updated at bedside  Severity of Illness: The appropriate patient status for this patient is INPATIENT. Inpatient status is judged to be reasonable and necessary in order to provide the required intensity of service to ensure the patient's safety. The patient's presenting symptoms, physical exam findings, and initial radiographic and laboratory data in the context of their chronic comorbidities is felt to place them at high risk for further clinical deterioration. Furthermore, it is not anticipated that the patient will be medically stable for discharge from the hospital within 2 midnights of admission.   * I certify that at the point of admission it is my clinical judgment that the patient will require inpatient hospital care spanning beyond 2 midnights from the point of admission due to high intensity of service, high risk for further deterioration and high frequency of surveillance required.*  Author: Lena Qualia, MD 12/27/2023 8:05 AM  For on call review [email or web address].

## 2023-12-27 NOTE — ED Notes (Signed)
 Pt being taken back to IR due to bleeding at site.

## 2023-12-27 NOTE — Progress Notes (Signed)
  IR BRIEF NOTE:  After purse string suture placement, patient continued to experience slow but steady bleeding. Per IR attending, Desmopressin  20 mg given over 30 min ordered to attempt to abate bleeding.  Electronically Signed: Lovena Rubinstein, PA-C 12/27/2023, 4:41 PM

## 2023-12-27 NOTE — ED Triage Notes (Addendum)
 Pt in from home via [facility] with n/v/d since yesterday. Pt has nightly peritoneal dialysis and reports having 2 episodes of emesis prior to last night's session. Also endorses a few episodes of diarrhea yesterday. Vomiting on ED arrival. Pt adds he has been on an abx for stomach infection x 1 wk

## 2023-12-27 NOTE — ED Notes (Signed)
Phlebotomy asked to get labs  ?

## 2023-12-28 ENCOUNTER — Encounter (HOSPITAL_COMMUNITY)

## 2023-12-28 DIAGNOSIS — N186 End stage renal disease: Secondary | ICD-10-CM | POA: Diagnosis not present

## 2023-12-28 DIAGNOSIS — T827XXA Infection and inflammatory reaction due to other cardiac and vascular devices, implants and grafts, initial encounter: Secondary | ICD-10-CM | POA: Diagnosis not present

## 2023-12-28 DIAGNOSIS — K659 Peritonitis, unspecified: Secondary | ICD-10-CM | POA: Diagnosis not present

## 2023-12-28 DIAGNOSIS — Z992 Dependence on renal dialysis: Secondary | ICD-10-CM | POA: Diagnosis not present

## 2023-12-28 LAB — GLUCOSE, CAPILLARY
Glucose-Capillary: 103 mg/dL — ABNORMAL HIGH (ref 70–99)
Glucose-Capillary: 127 mg/dL — ABNORMAL HIGH (ref 70–99)
Glucose-Capillary: 137 mg/dL — ABNORMAL HIGH (ref 70–99)
Glucose-Capillary: 149 mg/dL — ABNORMAL HIGH (ref 70–99)
Glucose-Capillary: 179 mg/dL — ABNORMAL HIGH (ref 70–99)
Glucose-Capillary: 183 mg/dL — ABNORMAL HIGH (ref 70–99)

## 2023-12-28 LAB — CBC
HCT: 23.8 % — ABNORMAL LOW (ref 39.0–52.0)
Hemoglobin: 7.4 g/dL — ABNORMAL LOW (ref 13.0–17.0)
MCH: 25.7 pg — ABNORMAL LOW (ref 26.0–34.0)
MCHC: 31.1 g/dL (ref 30.0–36.0)
MCV: 82.6 fL (ref 80.0–100.0)
Platelets: 426 10*3/uL — ABNORMAL HIGH (ref 150–400)
RBC: 2.88 MIL/uL — ABNORMAL LOW (ref 4.22–5.81)
RDW: 18 % — ABNORMAL HIGH (ref 11.5–15.5)
WBC: 9.8 10*3/uL (ref 4.0–10.5)
nRBC: 0 % (ref 0.0–0.2)

## 2023-12-28 LAB — RENAL FUNCTION PANEL
Albumin: 1.8 g/dL — ABNORMAL LOW (ref 3.5–5.0)
Anion gap: 12 (ref 5–15)
BUN: 26 mg/dL — ABNORMAL HIGH (ref 8–23)
CO2: 34 mmol/L — ABNORMAL HIGH (ref 22–32)
Calcium: 7.7 mg/dL — ABNORMAL LOW (ref 8.9–10.3)
Chloride: 97 mmol/L — ABNORMAL LOW (ref 98–111)
Creatinine, Ser: 2.44 mg/dL — ABNORMAL HIGH (ref 0.61–1.24)
GFR, Estimated: 27 mL/min — ABNORMAL LOW (ref >60)
Glucose, Bld: 194 mg/dL — ABNORMAL HIGH (ref 70–99)
Phosphorus: 2.1 mg/dL — ABNORMAL LOW (ref 2.5–4.6)
Potassium: 3 mmol/L — ABNORMAL LOW (ref 3.5–5.1)
Sodium: 143 mmol/L (ref 135–145)

## 2023-12-28 MED ORDER — VANCOMYCIN HCL 750 MG/150ML IV SOLN
750.0000 mg | Freq: Once | INTRAVENOUS | Status: AC
  Administered 2023-12-28: 750 mg via INTRAVENOUS

## 2023-12-28 MED ORDER — CALCITRIOL 0.25 MCG PO CAPS: 0.2500 ug | ORAL_CAPSULE | Freq: Every day | ORAL | Status: DC

## 2023-12-28 MED ORDER — VANCOMYCIN HCL 1000 MG IV SOLR
750.0000 mg | INTRAVENOUS | Status: DC
  Administered 2023-12-31: 750 mg via INTRAVENOUS
  Filled 2023-12-28 (×2): qty 15

## 2023-12-28 MED ORDER — HEPARIN SODIUM (PORCINE) 1000 UNIT/ML IJ SOLN
INTRAMUSCULAR | Status: AC
  Filled 2023-12-28: qty 3

## 2023-12-28 MED ORDER — HEPARIN SODIUM (PORCINE) 1000 UNIT/ML IJ SOLN
2800.0000 [IU] | Freq: Once | INTRAMUSCULAR | Status: AC
  Administered 2023-12-29: 2800 [IU]

## 2023-12-28 MED ORDER — VANCOMYCIN HCL 1000 MG IV SOLR
750.0000 mg | INTRAVENOUS | Status: DC
  Filled 2023-12-28: qty 15

## 2023-12-28 MED ORDER — VANCOMYCIN HCL 1000 MG IV SOLR
750.0000 mg | Freq: Once | INTRAVENOUS | Status: DC
  Filled 2023-12-28: qty 15

## 2023-12-28 MED ORDER — GABAPENTIN 300 MG PO CAPS
300.0000 mg | ORAL_CAPSULE | Freq: Every day | ORAL | Status: DC
Start: 2023-12-28 — End: 2024-01-05
  Administered 2023-12-29 – 2024-01-04 (×6): 300 mg via ORAL
  Filled 2023-12-28 (×6): qty 1

## 2023-12-28 MED ORDER — POTASSIUM CHLORIDE CRYS ER 20 MEQ PO TBCR
40.0000 meq | EXTENDED_RELEASE_TABLET | Freq: Every day | ORAL | Status: DC
  Administered 2023-12-28 – 2024-01-02 (×5): 40 meq via ORAL
  Filled 2023-12-28 (×5): qty 2

## 2023-12-28 MED ORDER — VANCOMYCIN HCL 750 MG/150ML IV SOLN
INTRAVENOUS | Status: AC
  Filled 2023-12-28: qty 150

## 2023-12-28 NOTE — Progress Notes (Signed)
 Nephrology Follow-Up Consult note   Assessment/Recommendations: Russell Austin is a/an 73 y.o. male with a past medical history significant for ESRD, admitted for complicated peritonitis.       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  Plan was to switch to hemodialysis in the outpatient setting now with significant symptoms related to peritonitis and loculated appearance.   - Temporary dialysis catheter placed on 4/25 with IR, appreciate help  - Plan for hemodialysis today mainly for volume and then maintain TTS schedule  - PD catheter removal on Monday with vascular surgery as well as tunneled dialysis catheter, appreciate help  - Management of peritonitis as below  - CLIP next week, will need to determine SNF first   # Volume/ hypertension: Dry weight around 75 kg.  Over dry weight at this time. Good urine output with lasix . Will continue for now. UF w/ HD.   # Anemia of Chronic Kidney Disease: Hemoglobin 8.1. Avoid iron given infection. Will plan to dose ESA with HD   # Secondary Hyperparathyroidism/Hyperphosphatemia: continue home calcitriol  0.5mcg daily. Phos at goal   # Vascular access: Temporary catheter in place.  PD catheter removal and TDC on Monday with vascular surgery.  Eventually will need a graft versus fistula.  I will get vein mapping over the weekend   # Loculated Peritonitis: polymicrobial with enterococcus, proteus, and kleb pneumo. This raises concern for some possible intestinal tear but given his appearance it seems unlikely. Will cover with vanc and cefepime ; given previous regimen did not cover all bacteria appropriately we will have to start on 4/25 and likely needs treatment for at least 3 weeks. Appreciate help from VVS    # N/v/d: treat symptomatically per primary. Likely related to peritonitis. Improving   Recommendations conveyed to primary service.    [name] [facility] 12/28/2023 9:08 AM  ___________________________________________________________  CC: Nausea and vomiting, diarrhea  Interval History/Subjective: Patient feels a little bit better today.  Still very tired.  Some ongoing abdominal pain.  No more diarrhea or vomiting.   Medications:  Current Facility-Administered Medications  Medication Dose Route Frequency Provider Last Rate Last Admin   acetaminophen  (TYLENOL ) tablet 650 mg  650 mg Oral Q6H PRN Smith, Rondell A, MD       Or   acetaminophen  (TYLENOL ) suppository 650 mg  650 mg Rectal Q6H PRN Felipe Horton, Rondell A, MD       albuterol (PROVENTIL) (2.5 MG/3ML) 0.083% nebulizer solution 2.5 mg  2.5 mg Nebulization Q6H PRN Smith, Rondell A, MD       amLODipine  (NORVASC ) tablet 5 mg  5 mg Oral Daily Smith, Rondell A, MD   5 mg at 12/28/23 0903   atorvastatin  (LIPITOR) tablet 10 mg  10 mg Oral q morning Smith, Rondell A, MD   10 mg at 12/28/23 9629   buPROPion  (WELLBUTRIN  XL) 24 hr tablet 300 mg  300 mg Oral q morning Smith, Rondell A, MD   300 mg at 12/28/23 0903   calcitRIOL  (ROCALTROL ) capsule 0.5 mcg  0.5 mcg Oral Daily Ruba Outen J, MD   0.5 mcg at 12/28/23 5284   ceFEPIme  (MAXIPIME ) 1 g in sodium chloride  0.9 % 100 mL IVPB  1 g Intravenous Q2200 Smith, Rondell A, MD 200 mL/hr at 12/27/23 2158 1 g at 12/27/23 2158   Chlorhexidine  Gluconate Cloth 2 % PADS 6 each  6 each Topical Q0600 [name], MD  6 each at 12/28/23 0617   chlorproMAZINE  (THORAZINE ) tablet 25 mg  25 mg Oral Daily PRN Smith, Rondell A, MD       fentaNYL  (SUBLIMAZE ) injection 25 mcg  25 mcg Intravenous Q2H PRN Smith, Rondell A, MD       gentamicin  cream (GARAMYCIN ) 0.1 % 1 Application  1 Application Topical Daily Rakayla Ricklefs J, MD       heparin  injection 5,000 Units  5,000 Units Subcutaneous Q8H Smith, Rondell A, MD   5,000 Units at 12/28/23 1610   hydrALAZINE (APRESOLINE) injection 10 mg  10 mg Intravenous Q4H PRN Smith, Rondell A, MD       insulin   aspart (novoLOG ) injection 0-15 Units  0-15 Units Subcutaneous TID WC Smith, Rondell A, MD   2 Units at 12/28/23 9604   insulin  aspart (novoLOG ) injection 8 Units  8 Units Subcutaneous Once Smith, Rondell A, MD       insulin  glargine-yfgn (SEMGLEE ) injection 18 Units  18 Units Subcutaneous QHS Smith, Rondell A, MD   18 Units at 12/27/23 2200   losartan  (COZAAR ) tablet 100 mg  100 mg Oral Daily Ilia Dimaano J, MD   100 mg at 12/28/23 0903   metoprolol  tartrate (LOPRESSOR ) tablet 50 mg  50 mg Oral BID Smith, Rondell A, MD   50 mg at 12/28/23 5409   ondansetron  (ZOFRAN ) tablet 4 mg  4 mg Oral Q6H PRN Lena Qualia, MD       Or   ondansetron  (ZOFRAN ) injection 4 mg  4 mg Intravenous Q6H PRN Lena Qualia, MD       pantoprazole  (PROTONIX ) EC tablet 40 mg  40 mg Oral Daily Felipe Horton, Rondell A, MD   40 mg at 12/28/23 0903   potassium chloride  SA (KLOR-CON  M) CR tablet 40 mEq  40 mEq Oral Daily Samtani, Jai-Gurmukh, MD   40 mEq at 12/28/23 8119   sodium chloride  flush (NS) 0.9 % injection 10-40 mL  10-40 mL Intracatheter Q12H Smith, Rondell A, MD   10 mL at 12/27/23 2320   sodium chloride  flush (NS) 0.9 % injection 10-40 mL  10-40 mL Intracatheter PRN [name] A, MD       sodium chloride  flush (NS) 0.9 % injection 3 mL  3 mL Intravenous Q12H Smith, Rondell A, MD   3 mL at 12/27/23 2200      Review of Systems: 10 systems reviewed and negative except per interval history/subjective  Physical Exam: Vitals:   12/28/23 0450 12/28/23 0729  BP: (!) 174/87 (!) 163/79  Pulse: 86 88  Resp: 18 16  Temp: 97.7 F (36.5 C) 98.5 F (36.9 C)  SpO2: 100% 100%   No intake/output data recorded.  Intake/Output Summary (Last 24 hours) at 12/28/2023 0908 Last data filed at 12/28/2023 0451 Gross per 24 hour  Intake 327.63 ml  Output 2200 ml  Net -1872.37 ml   Constitutional: ill-appearing, no acute distress ENMT: ears and nose without scars or lesions, MMM CV: normal rate, 1+ edema in the  thighs Respiratory: clear to auscultation, normal work of breathing Gastrointestinal: soft, non-tender, no palpable masses or hernias Skin: no visible lesions or rashes Psych: alert, judgement/insight appropriate, appropriate mood and affect   Test Results I personally reviewed new and old clinical labs and radiology tests Lab Results  Component Value Date   NA 143 12/28/2023   K 3.0 (L) 12/28/2023   CL 97 (L) 12/28/2023   CO2 34 (H) 12/28/2023   BUN 26 (H) 12/28/2023  CREATININE 2.44 (H) 12/28/2023   GFR 10.52 (LL) 02/28/2023   CALCIUM  7.7 (L) 12/28/2023   ALBUMIN  1.8 (L) 12/28/2023   PHOS 2.1 (L) 12/28/2023    CBC Recent Labs  Lab 12/27/23 0543 12/28/23 0229  WBC 6.7 9.8  HGB 8.1* 7.4*  HCT 26.4* 23.8*  MCV 83.8 82.6  PLT 426* 426*

## 2023-12-28 NOTE — Plan of Care (Signed)
  Problem: Skin Integrity: Goal: Risk for impaired skin integrity will decrease Outcome: Progressing   Problem: Education: Goal: Knowledge of General Education information will improve Description: Including pain rating scale, medication(s)/side effects and non-pharmacologic comfort measures Outcome: Progressing   Problem: Elimination: Goal: Will not experience complications related to bowel motility Outcome: Progressing   Problem: Pain Managment: Goal: General experience of comfort will improve and/or be controlled Outcome: Progressing   Problem: Safety: Goal: Ability to remain free from injury will improve Outcome: Progressing

## 2023-12-28 NOTE — Progress Notes (Signed)
  IR BRIEF NOTE:  Patient seen in follow-up for bleeding temp HD cath s/p placement in IR by Dr. Lovell Rubenstein yesterday. Patient states he has not bled further from temp HD cath since receiving DDAVP  yesterday. Dressing clean, intact, dry, without tenderness nor bleeding. Patient doing well, comfortable. HD planned for Monday 4/28.  IR remains available. Please reach out with any further requests.    Electronically Signed: Lovena Rubinstein, PA-C 12/28/2023, 11:12 AM

## 2023-12-28 NOTE — Evaluation (Addendum)
 Physical Therapy Evaluation Patient Details Name: Russell Austin MRN: [id number] DOB: 1951/05/07 Today's Date: 12/28/2023  History of Present Illness  73 y.o. male admitted 4/25 with Sepsis. PMH: Arthritis, Blindness, Chronic kidney disease, Diabetes mellitus type II, Diabetic retinopathy, ED, Glaucoma of both eyes, Hypertension, Left hydrocele, and Stroke.  Clinical Impression  Pt admitted with above diagnosis. PTA pt relatively sedentary but was going to OPPT to work on function, unfortunately not progressing and recommended HHPT follow-ups. He is able to walk short distances at home with a RW. Family assist with transfers and bath set-up but can bathe himself. Required mod assist for bed mobility and min assist to transfer and step pivot with RW, demonstrating posterior lean today. Patient will benefit from continued inpatient follow up therapy, <3 hours/day. Wife and pt open to post-acute rehab to regain strength and improve functional safety and independence; reports multiple falls at home. Pt currently with functional limitations due to the deficits listed below (see PT Problem List). Pt will benefit from acute skilled PT to increase their independence and safety with mobility to allow discharge.           If plan is discharge home, recommend the following: A lot of help with walking and/or transfers;A lot of help with bathing/dressing/bathroom;Assistance with cooking/housework;Direct supervision/assist for medications management;Direct supervision/assist for financial management;Assist for transportation;Help with stairs or ramp for entrance;Supervision due to cognitive status   Can travel by private vehicle   Yes    Equipment Recommendations None recommended by PT  Recommendations for Other Services       Functional Status Assessment Patient has had a recent decline in their functional status and demonstrates the ability to make significant improvements in function in a  reasonable and predictable amount of time.     Precautions / Restrictions Precautions Precautions: Fall Recall of Precautions/Restrictions: Impaired Restrictions Weight Bearing Restrictions Per Provider Order: No      Mobility  Bed Mobility Overal bed mobility: Needs Assistance Bed Mobility: Rolling, Sidelying to Sit, Sit to Sidelying Rolling: Mod assist, Used rails Sidelying to sit: Mod assist, HOB elevated, Used rails     Sit to sidelying: Mod assist General bed mobility comments: Mod assist to roll towards Rt side, reach for rail and able to sustain grip. Mod assist for trunk to rise and lower LEs, difficulty sequencing without multimodal cues.. Mod assist for LE support back into bed. Cues for technique throughout..    Transfers Overall transfer level: Needs assistance Equipment used: Rolling walker (2 wheels) Transfers: Sit to/from Stand, Bed to chair/wheelchair/BSC Sit to Stand: Min assist   Step pivot transfers: Min assist       General transfer comment: Min assist for boost to stand from bed x2 with posterior lean initially. Gradually improved. Able to progress with lateral steps along bed with min assist for balance and tactile cues to facilitate RLE step for pivot.    Ambulation/Gait                  Stairs            Wheelchair Mobility     Tilt Bed    Modified Rankin (Stroke Patients Only)       Balance Overall balance assessment: Needs assistance Sitting-balance support: No upper extremity supported, Feet supported Sitting balance-Leahy Scale: Poor Sitting balance - Comments: Intermittent posterior instability min assist to correct   Standing balance support: Bilateral upper extremity supported, Reliant on assistive device for balance Standing balance-Leahy Scale: Poor  Pertinent Vitals/Pain Pain Assessment Pain Assessment: No/denies pain    Home Living Family/patient expects to be  discharged to:: Private residence Living Arrangements: Spouse/significant other Available Help at Discharge: Family;Available PRN/intermittently Type of Home: House Home Access: Stairs to enter Entrance Stairs-Rails: Doctor, general practice of Steps: 2 garage,4 front of house, rails in front only Alternate Level Stairs-Number of Steps: 14 Home Layout: Two level;Bed/bath upstairs;1/2 bath on main level Home Equipment: Rolling Walker (2 wheels);Cane - single point;Wheelchair - manual;BSC/3in1;Shower seat Additional Comments: BSC over toilet only. Stays upstairs, but comes down once per month for dinner or MD appointments.    Prior Function Prior Level of Function : Needs assist;History of Falls (last six months)             Mobility Comments: RW indoors with assist, can get up/down steps but needs quite a bit of help from family. ADLs Comments: Needs help bathing set up and transfer but washes himself on shower seat. Family asissts with bathing     Extremity/Trunk Assessment   Upper Extremity Assessment Upper Extremity Assessment: Defer to OT evaluation    Lower Extremity Assessment Lower Extremity Assessment: Generalized weakness;RLE deficits/detail RLE Deficits / Details: hx Rt hemi, notably weaker than Lt. RLE Coordination: decreased fine motor;decreased gross motor       Communication   Communication Communication: No apparent difficulties    Cognition Arousal: Lethargic Behavior During Therapy: Flat affect   PT - Cognitive impairments: History of cognitive impairments (Oriented to self, month/year, and location)                         Following commands: Impaired Following commands impaired: Only follows one step commands consistently     Cueing Cueing Techniques: Verbal cues, Gestural cues, Tactile cues     General Comments General comments (skin integrity, edema, etc.): Wife present, supportive    Exercises     Assessment/Plan     PT Assessment Patient needs continued PT services  PT Problem List Decreased strength;Decreased range of motion;Decreased activity tolerance;Decreased balance;Decreased mobility;Decreased coordination;Decreased cognition;Decreased knowledge of use of DME;Decreased safety awareness;Decreased knowledge of precautions       PT Treatment Interventions DME instruction;Gait training;Functional mobility training;Therapeutic activities;Therapeutic exercise;Balance training;Neuromuscular re-education;Cognitive remediation;Patient/family education;Wheelchair mobility training    PT Goals (Current goals can be found in the Care Plan section)  Acute Rehab PT Goals Patient Stated Goal: Get stronger at rehab PT Goal Formulation: With patient/family Time For Goal Achievement: 01/11/24 Potential to Achieve Goals: Fair    Frequency Min 2X/week     Co-evaluation               AM-PAC PT "6 Clicks" Mobility  Outcome Measure Help needed turning from your back to your side while in a flat bed without using bedrails?: A Lot Help needed moving from lying on your back to sitting on the side of a flat bed without using bedrails?: A Lot Help needed moving to and from a bed to a chair (including a wheelchair)?: A Lot Help needed standing up from a chair using your arms (e.g., wheelchair or bedside chair)?: A Little Help needed to walk in hospital room?: A Lot Help needed climbing 3-5 steps with a railing? : Total 6 Click Score: 12    End of Session Equipment Utilized During Treatment: Gait belt Activity Tolerance: Patient limited by fatigue Patient left: in bed;with call bell/phone within reach;with bed alarm set;with family/visitor present (Bed in semi-chair position) Nurse Communication: Mobility  status PT Visit Diagnosis: Unsteadiness on feet (R26.81);Muscle weakness (generalized) (M62.81);Repeated falls (R29.6);History of falling (Z91.81);Difficulty in walking, not elsewhere classified  (R26.2);Other symptoms and signs involving the nervous system (R29.898);Hemiplegia and hemiparesis Hemiplegia - Right/Left: Right    Time: 4098-1191 PT Time Calculation (min) (ACUTE ONLY): 26 min   Charges:   PT Evaluation $PT Eval Low Complexity: 1 Low PT Treatments $Therapeutic Activity: 8-22 mins PT General Charges $$ ACUTE PT VISIT: 1 Visit         Jory Ng, PT, DPT [facility]  Rehabilitation Services Physical Therapist Office: [phone] Website: [email or web address]   Alinda Irani 12/28/2023, 4:07 PM

## 2023-12-28 NOTE — Progress Notes (Signed)
 [facility]  Subjective  - no complaints.  Temp dialysis catheter placed yesterday.   Objective (!) 163/79 88 98.5 F (36.9 C) (Oral) 16 100%  Intake/Output Summary (Last 24 hours) at 12/28/2023 1007 Last data filed at 12/28/2023 0451 Gross per 24 hour  Intake 327.63 ml  Output 2200 ml  Net -1872.37 ml    PD catheter in place  Laboratory Lab Results: Recent Labs    12/27/23 0543 12/28/23 0229  WBC 6.7 9.8  HGB 8.1* 7.4*  HCT 26.4* 23.8*  PLT 426* 426*   BMET Recent Labs    12/27/23 0543 12/28/23 0229  NA 137 143  K 3.4* 3.0*  CL 94* 97*  CO2 28 34*  GLUCOSE 329* 194*  BUN 25* 26*  CREATININE 2.62* 2.44*  CALCIUM  8.0* 7.7*    COAG Lab Results  Component Value Date   INR 1.06 02/10/2015   INR 1.05 02/09/2015   No results found for: "PTT"  Assessment/Planning:  Discussed plan for removal of PD catheter and placement of tunneled dialysis catheter on Monday in the OR given concern for peritonitis with his PD catheter - PD cultures positive.  Temporary line was placed yesterday by IR so that he can get his volume status corrected this weekend.  Russell Austin 12/28/2023 10:07 AM --

## 2023-12-28 NOTE — Progress Notes (Signed)
 [facility] ROUNDING NOTE TAYVEON OHR ZOX:[id number]  DOB: 1950-11-02  DOA: 12/27/2023  PCP: Alto Atta, NP  12/28/2023,7:04 AM  LOS: 1 day    Code Status: Full code   from: Home current Dispo: Likely home   73 year old black male Diabetes mellitus type 2 with retinopathy glaucoma-right eye visual loss Prior lower brainstem infarct 2016 needing core track and CIR evaluation at the time HLD ESRD previously on PD since 05/2023 dry weight 75 Performed chronic disease secondary hyperparathyroidism--previous complications with PD having Enterococcus faecalis peritonitis treated with vancomycin  Developed symptoms of peritonitis 4/15 patient was started on cefepime  vancomycin  switched to ceftazidime-developed diarrhea nausea multiple vomiting episodes Directed to come to ED 4/25 by Nephrology on-call service--- PD fluid check on 4/15 showed many gram-negative coccobacilli rare gram-positive cocci in pairs and clusters with resistance to ampicillin cefazolin  Sodium 137 potassium 3.4 BUN/creatinine 25/2.6 calcium  8 phosphorus 2.6 LFTs AST 45 ALT 38 Lactic acid cycling 1.9-2.1 WBC 6.7 hemoglobin 8.1 platelet 426--BC obtained, peritoneal fluid examined showing 95 nucleated cells 91% neutrophils cloudy appearance CT ABD pelvis peritoneal dialysis with loculated peritoneal fluid collections in ventral abdomen tracking along right liver?  Subcapsular involvement given degree of right liver scalloping-small volume pleural fluid with atelectasis right more than left base His primary nephrologist who is working consulted and saw the patient Vascular surgeon evaluated patient for permanent access placement eventually Patient had a temporary trialysis catheter placed but complication of bleeding from left jugular catheter site--desmopressin  was given X2   Plan  Sepsis 2/2 polymicrobial dialysate infection Gram-negative coccobacilli/gram-positive cocci pair/cluster on 4/15--current body fluid culture growing  gram-positive cocci-keep on IV cefepime /vancomycin  renally dosed Defer to renal if they wish to use peritoneal antibiotics Sepsis physiology seems resolved  Bleeding from left trialysis jugular site Desmopressin  X2-appreciate follow-up by IR-hemoglobin has dropped from baseline of 9-7.4-nephrology planning erythropoietin agents Holding aspirin  81 at this time for now  Hypokalemia/ESRD now on HD instead of PD-hyper parathyroid, anemia renal disease Trialysis access catheter placed-dialysis through this Permanent access plan per vascular on 4/28  not taking calcitriol  0.25 daily-reordered  holding losartan  100  daily continue metoprolol  50 twice daily amlodipine  5, holding chlorthalidone torsemide  Mildly elevated LFT/transaminitis, mild thrombocytosis Both secondary to likely infection trend to normal  DM ty 2 At home 8 units 3 times daily which we will continue--continue Lantus  18 CBGs ranging 140-190  holding Glucotrol  XL 5  Previous stroke 2016, HLD Dense R side deficit--using wc for the past 3 mo and debilitated--therapy to see--might require skilled placeemnt  DVT prophylaxis: heparin   Status is: Inpatient Remains inpatient appropriate because:   Requires further care   Subjective: Coherent awake no distress No abd pain--No cp fever--wasn't hungry this am--able to eat without n/v   Objective + exam Vitals:   12/27/23 2201 12/28/23 0031 12/28/23 0450 12/28/23 0500  BP: (!) 151/71 (!) 172/85 (!) 174/87   Pulse: 87 92 86   Resp:  18 18   Temp:  98.6 F (37 C) 97.7 F (36.5 C)   TempSrc:  Oral Oral   SpO2:  100% 100%   Weight:    79.4 kg  Height:       Filed Weights   12/27/23 0531 12/28/23 0500  Weight: 79.4 kg 79.4 kg    Examination: Post op changes R eye, eomi l side Power 5/5 Cta b no added sound no wheeze rales rhonchi Abd soft no rebound---HD cath in place Neuro intact, but R side has focal motor deficit limited  ROM and power---power 5/5 L  side--also weaker in R LE  Data Reviewed: reviewed   CBC    Component Value Date/Time   WBC 9.8 12/28/2023 0229   RBC 2.88 (L) 12/28/2023 0229   HGB 7.4 (L) 12/28/2023 0229   HCT 23.8 (L) 12/28/2023 0229   PLT 426 (H) 12/28/2023 0229   MCV 82.6 12/28/2023 0229   MCH 25.7 (L) 12/28/2023 0229   MCHC 31.1 12/28/2023 0229   RDW 18.0 (H) 12/28/2023 0229   LYMPHSABS 1.1 12/19/2021 0741   MONOABS 0.4 12/19/2021 0741   EOSABS 0.1 12/19/2021 0741   BASOSABS 0.0 12/19/2021 0741      Latest Ref Rng & Units 12/28/2023    2:29 AM 12/27/2023    5:43 AM 09/16/2023   12:46 PM  CMP  Glucose 70 - 99 mg/dL 086  578  469   BUN 8 - 23 mg/dL 26  25  39   Creatinine 0.61 - 1.24 mg/dL 6.29  5.28  4.13   Sodium 135 - 145 mmol/L 143  137  134   Potassium 3.5 - 5.1 mmol/L 3.0  3.4  3.6   Chloride 98 - 111 mmol/L 97  94  97   CO2 22 - 32 mmol/L 34  28  27   Calcium  8.9 - 10.3 mg/dL 7.7  8.0  7.9   Total Protein 6.5 - 8.1 g/dL  7.2  6.1   Total Bilirubin 0.0 - 1.2 mg/dL  0.6  0.6   Alkaline Phos 38 - 126 U/L  108  91   AST 15 - 41 U/L  45  17   ALT 0 - 44 U/L  38  14     Scheduled Meds:  amLODipine   5 mg Oral Daily   atorvastatin   10 mg Oral q morning   buPROPion   300 mg Oral q morning   calcitRIOL   0.5 mcg Oral Daily   Chlorhexidine  Gluconate Cloth  6 each Topical Q0600   gentamicin  cream  1 Application Topical Daily   heparin  injection (subcutaneous)  5,000 Units Subcutaneous Q8H   insulin  aspart  0-15 Units Subcutaneous TID WC   insulin  aspart  8 Units Subcutaneous Once   insulin  glargine-yfgn  18 Units Subcutaneous QHS   losartan   100 mg Oral Daily   metoprolol  tartrate  50 mg Oral BID   pantoprazole   40 mg Oral Daily   sodium chloride  flush  10-40 mL Intracatheter Q12H   sodium chloride  flush  3 mL Intravenous Q12H   Continuous Infusions:  ceFEPime  (MAXIPIME ) IV 1 g (12/27/23 2158)    Time  44  Verlie Glisson, MD  [facility]

## 2023-12-28 NOTE — Progress Notes (Signed)
 Blood noted on the PD cath. On call nephrology informed.

## 2023-12-29 ENCOUNTER — Inpatient Hospital Stay (HOSPITAL_COMMUNITY)

## 2023-12-29 DIAGNOSIS — N186 End stage renal disease: Secondary | ICD-10-CM | POA: Diagnosis not present

## 2023-12-29 DIAGNOSIS — T827XXA Infection and inflammatory reaction due to other cardiac and vascular devices, implants and grafts, initial encounter: Secondary | ICD-10-CM | POA: Diagnosis not present

## 2023-12-29 DIAGNOSIS — Z992 Dependence on renal dialysis: Secondary | ICD-10-CM | POA: Diagnosis not present

## 2023-12-29 DIAGNOSIS — K659 Peritonitis, unspecified: Secondary | ICD-10-CM | POA: Diagnosis not present

## 2023-12-29 LAB — SURGICAL PCR SCREEN
MRSA, PCR: NEGATIVE
Staphylococcus aureus: NEGATIVE

## 2023-12-29 LAB — GLUCOSE, CAPILLARY
Glucose-Capillary: 103 mg/dL — ABNORMAL HIGH (ref 70–99)
Glucose-Capillary: 100 mg/dL — ABNORMAL HIGH (ref 70–99)
Glucose-Capillary: 98 mg/dL (ref 70–99)
Glucose-Capillary: 125 mg/dL — ABNORMAL HIGH (ref 70–99)

## 2023-12-29 LAB — BASIC METABOLIC PANEL WITH GFR
Anion gap: 9 (ref 5–15)
BUN: 22 mg/dL (ref 8–23)
CO2: 30 mmol/L (ref 22–32)
Calcium: 7.4 mg/dL — ABNORMAL LOW (ref 8.9–10.3)
Chloride: 100 mmol/L (ref 98–111)
Creatinine, Ser: 2.13 mg/dL — ABNORMAL HIGH (ref 0.61–1.24)
GFR, Estimated: 32 mL/min — ABNORMAL LOW (ref >60)
Glucose, Bld: 118 mg/dL — ABNORMAL HIGH (ref 70–99)
Potassium: 3.3 mmol/L — ABNORMAL LOW (ref 3.5–5.1)
Sodium: 139 mmol/L (ref 135–145)

## 2023-12-29 LAB — CBC WITH DIFFERENTIAL/PLATELET
Abs Immature Granulocytes: 0.05 10*3/uL (ref 0.00–0.07)
Basophils Absolute: 0 10*3/uL (ref 0.0–0.1)
Basophils Relative: 0 %
Eosinophils Absolute: 0.1 10*3/uL (ref 0.0–0.5)
Eosinophils Relative: 1 %
HCT: 24.5 % — ABNORMAL LOW (ref 39.0–52.0)
Hemoglobin: 7.6 g/dL — ABNORMAL LOW (ref 13.0–17.0)
Immature Granulocytes: 1 %
Lymphocytes Relative: 14 %
Lymphs Abs: 1.5 10*3/uL (ref 0.7–4.0)
MCH: 25.8 pg — ABNORMAL LOW (ref 26.0–34.0)
MCHC: 31 g/dL (ref 30.0–36.0)
MCV: 83.1 fL (ref 80.0–100.0)
Monocytes Absolute: 0.8 10*3/uL (ref 0.1–1.0)
Monocytes Relative: 7 %
Neutro Abs: 8.2 10*3/uL — ABNORMAL HIGH (ref 1.7–7.7)
Neutrophils Relative %: 77 %
Platelets: 373 10*3/uL (ref 150–400)
RBC: 2.95 MIL/uL — ABNORMAL LOW (ref 4.22–5.81)
RDW: 18.4 % — ABNORMAL HIGH (ref 11.5–15.5)
WBC: 10.6 10*3/uL — ABNORMAL HIGH (ref 4.0–10.5)
nRBC: 0 % (ref 0.0–0.2)

## 2023-12-29 LAB — HEPATITIS B SURFACE ANTIBODY, QUANTITATIVE: Hep B S AB Quant (Post): <3.5 m[IU]/mL — ABNORMAL LOW

## 2023-12-29 MED ORDER — TORSEMIDE 20 MG PO TABS
60.0000 mg | ORAL_TABLET | Freq: Every day | ORAL | Status: DC
  Administered 2023-12-29: 60 mg via ORAL
  Filled 2023-12-29: qty 3

## 2023-12-29 MED ORDER — AMLODIPINE BESYLATE 10 MG PO TABS: 10.0000 mg | ORAL_TABLET | Freq: Every day | ORAL | Status: DC

## 2023-12-29 NOTE — TOC Initial Note (Addendum)
 Transition of Care [facility]) - Initial/Assessment Note    Patient Details  Name: Russell Austin MRN: [id number] Date of Birth: May 05, 1951  Transition of Care [facility]) CM/SW Contact:    Ernst Heap Phone Number: [phone] 12/29/2023, 12:27 PM  Clinical Narrative:   HF CSW met with patient at the bedside. CSW introduced self and explained role. Patient stated that he lives at home with wife. Patient stated that he does not have a history of HH services. Patient stated that he uses a cane and walker at home. Patient stated that he has a PCP. Patient stated that he does not drive, and wife takes him to and from appointments. Patient stated that his wife will transport him home at dc. CSW addressed SNF consult. Patient was agreeable to being faxed out. Will present offers at a later time.   CSW completed passr, FL2, and faxed out- SNF offers pending.   12:31 PM- CSW called the patients wife and left a VM. Patient requested to contact his wife to share updates. Will continue to make contact.   TOC will continue following.          Expected Discharge Plan: Home/Self Care Barriers to Discharge: Continued Medical Work up   Patient Goals and CMS Choice            Expected Discharge Plan and Services       Living arrangements for the past 2 months: Single Family Home                                      Prior Living Arrangements/Services Living arrangements for the past 2 months: Single Family Home Lives with:: Self Patient language and need for interpreter reviewed:: Yes Do you feel safe going back to the place where you live?: No   been falling altely and scared to continue following  Need for Family Participation in Patient Care: Yes (Comment) Care giver support system in place?: Yes (comment)   Criminal Activity/Legal Involvement Pertinent to Current Situation/Hospitalization: No - Comment as needed  Activities of Daily Living   ADL Screening  (condition at time of admission) Independently performs ADLs?: No Does the patient have a NEW difficulty with bathing/dressing/toileting/self-feeding that is expected to last >3 days?: No Does the patient have a NEW difficulty with getting in/out of bed, walking, or climbing stairs that is expected to last >3 days?: No Does the patient have a NEW difficulty with communication that is expected to last >3 days?: No Is the patient deaf or have difficulty hearing?: No Does the patient have difficulty seeing, even when wearing glasses/contacts?: No Does the patient have difficulty concentrating, remembering, or making decisions?: Yes  Permission Sought/Granted                  Emotional Assessment Appearance:: Appears stated age Attitude/Demeanor/Rapport: Engaged Affect (typically observed): Appropriate Orientation: : Oriented to Situation, Oriented to  Time, Oriented to Place, Oriented to Self Alcohol / Substance Use: Not Applicable Psych Involvement: No (comment)  Admission diagnosis:  Peritonitis (HCC) [K65.9] Patient Active Problem List   Diagnosis Date Noted   Peritonitis (HCC) 12/27/2023   ESRD on dialysis (HCC) 12/27/2023   Hypertensive urgency 12/27/2023   Normocytic anemia 12/27/2023   Legally blind 12/27/2023   Controlled diabetes mellitus with arthropathy, with long-term current use of insulin  (HCC) 07/14/2020   Hyperlipidemia 07/14/2020   Osteoarthritis 07/14/2020   Plantar  fasciitis 07/14/2020   Stasis edema of both lower extremities 01/01/2019   Primary osteoarthritis of left shoulder 03/20/2018   Spondylosis of 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 myelopathy or radiculopathy 03/20/2018   Complex regional pain syndrome I of upper limb 06/27/2015   Dysphagia following cerebral infarction 02/15/2015   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 [place] [address] Phone: [phone] Fax: [phone]     Social Drivers of Health (SDOH) Social History: SDOH Screenings   Food Insecurity: No Food Insecurity (12/27/2023)  Housing: Low Risk  (12/27/2023)  Transportation Needs: No Transportation Needs (12/27/2023)  Utilities: Not At Risk (12/27/2023)  Alcohol Screen: Low Risk  (01/21/2023)  Depression (PHQ2-9): Low Risk  (02/28/2023)  Recent Concern: Depression (PHQ2-9) - High Risk (01/08/2023)  Financial Resource Strain: Low Risk  (01/21/2023)  Physical Activity: Inactive (01/21/2023)  Social Connections: Socially Integrated (12/27/2023)  Stress: No Stress Concern Present (01/21/2023)  Tobacco Use: Low Risk  (12/27/2023)   SDOH Interventions:     Readmission Risk Interventions     No data to display

## 2023-12-29 NOTE — Progress Notes (Signed)
 Received patient in bed to unit.  Alert and oriented.  Informed consent signed and in chart.   TX duration:3 hours  Patient tolerated well.  Transported back to the room  Alert, without acute distress.  Hand-off given to patient's nurse.   Access used: dialysis cath Access issues: live reversed. Red port with resistance with aspiration  Total UF removed: 3300 Medication(s) given: vancomycin  750 mg/115ml, heplock 1.4 units per port Post HD VS: see table below Post HD weight: 66.2kg    12/28/23 2355  Vitals  Temp 97.8 F (36.6 C)  Temp Source Oral  BP 129/78  MAP (mmHg) 94  BP Location Left Arm  BP Method Automatic  Patient Position (if appropriate) Lying  Pulse Rate 90  Pulse Rate Source Monitor  ECG Heart Rate 90  Resp 13  Oxygen Therapy  SpO2 100 %  O2 Device Room Air  Patient Activity (if Appropriate) In bed  Pulse Oximetry Type Continuous  During Treatment Monitoring  Blood Flow Rate (mL/min) 0 mL/min  Arterial Pressure (mmHg) 0.8 mmHg  Venous Pressure (mmHg) -1.41 mmHg  TMP (mmHg) -49.69 mmHg  Ultrafiltration Rate (mL/min) 0 mL/min  Dialysate Flow Rate (mL/min) 300 ml/min  Dialysate Potassium Concentration 4  Dialysate Calcium  Concentration 2.5  Duration of HD Treatment -hour(s) 3 hour(s)  Cumulative Fluid Removed (mL) per Treatment  3306.02  HD Safety Checks Performed Yes  Intra-Hemodialysis Comments Tolerated well  Post Treatment  Dialyzer Clearance Lightly streaked  Hemodialysis Intake (mL) 150 mL  Liters Processed 72  Fluid Removed (mL) 3300 mL  Tolerated HD Treatment Yes  Hemodialysis Catheter Left Internal jugular Triple lumen Temporary (Non-Tunneled)  Placement Date/Time: 12/27/23 1253   Placed prior to admission: No  Serial / Lot #: [id number]  Expiration Date: 03/02/28  Time Out: Correct patient;Correct site;Correct procedure  Maximum sterile barrier precautions: Hand hygiene;Cap;Mask;Sterile gown...  Site Condition No complications  Blue Lumen  Status Flushed;Heparin  locked;Dead end cap in place  Red Lumen Status Flushed;Heparin  locked;Dead end cap in place  Purple Lumen Status Other (Comment) (capped)  Catheter fill solution Heparin  1000 units/ml  Catheter fill volume (Arterial) 1.4 cc  Catheter fill volume (Venous) 1.4      [facility]

## 2023-12-29 NOTE — Progress Notes (Signed)
 VASCULAR LAB    Upper extremity vein mapping has been performed.  See CV proc for preliminary results.   Perrion Diesel, RVT 12/29/2023, 2:19 PM

## 2023-12-29 NOTE — Evaluation (Signed)
 Occupational Therapy Evaluation Patient Details Name: Russell Austin MRN: [id number] DOB: 10/02/1950 Today's Date: 12/29/2023   History of Present Illness   73 y.o. male admitted 4/25 with Sepsis. PMH: Arthritis, Blindness, Chronic kidney disease, Diabetes mellitus type II, Diabetic retinopathy, ED, Glaucoma of both eyes, Hypertension, Left hydrocele, and Stroke.     Clinical Impressions Pt currently at mod assist level overall for stand pivot transfers with use of the RW for support.  Mod to max assist needed for LB selfcare sit to stand.  Still with ongoing right hemiparesis from previous CVA impacting mobility and ADL independence.  Prior to admission pt reports living with his spouse and needing mod assist for bathing and dressing tasks overall with min assist needed for mobility.  He was living on the second level of his house and would only come up and down approximately once a month or more for appointments.  Feel based on current assist level that he will benefit from acute care OT at this time in order to progress ADL independence to a level that's safe for return home with 24 hour assist.  Recommend post acute OT at inpatient follow up facility to reach this level once discharged from acute care.        If plan is discharge home, recommend the following:   A little help with walking and/or transfers;A lot of help with bathing/dressing/bathroom;Help with stairs or ramp for entrance;Direct supervision/assist for medications management;Assist for transportation     Functional Status Assessment   Patient has had a recent decline in their functional status and demonstrates the ability to make significant improvements in function in a reasonable and predictable amount of time.     Equipment Recommendations   None recommended by OT      Precautions/Restrictions   Precautions Precautions: Fall Recall of Precautions/Restrictions: Impaired Restrictions Weight Bearing  Restrictions Per Provider Order: No     Mobility Bed Mobility Overal bed mobility: Needs Assistance Bed Mobility: Supine to Sit, Sit to Supine     Supine to sit: Max assist Sit to supine: Max assist   General bed mobility comments: Assist with all aspects of supine to sit and for scooting hips out to the EOB.  Max assist for transition back to supine.    Transfers Overall transfer level: Needs assistance Equipment used: Rolling walker (2 wheels) Transfers: Sit to/from Stand, Bed to chair/wheelchair/BSC Sit to Stand: Mod assist     Step pivot transfers: Mod assist     General transfer comment: Mod assist with use of the RW for support.  Pt maintains flexed head and trunk with decreased efficiency at advancing the RLE.      Balance Overall balance assessment: Needs assistance   Sitting balance-Leahy Scale: Poor Sitting balance - Comments: Posterior LOB in sitting   Standing balance support: Bilateral upper extremity supported, Reliant on assistive device for balance Standing balance-Leahy Scale: Poor Standing balance comment: Assist needed from therapist and use of RW to maintain balance.                           ADL either performed or assessed with clinical judgement   ADL Overall ADL's : Needs assistance/impaired Eating/Feeding: Set up;Sitting   Grooming: Wash/dry face;Set up   Upper Body Bathing: Minimal assistance;Sitting   Lower Body Bathing: Moderate assistance;Sit to/from stand   Upper Body Dressing : Minimal assistance;Sitting   Lower Body Dressing: Maximal assistance;Sit to/from stand   Toilet Transfer:  Moderate assistance;BSC/3in1;Rolling walker (2 wheels);Ambulation   Toileting- Clothing Manipulation and Hygiene: Moderate assistance;Sit to/from stand       Functional mobility during ADLs: Moderate assistance;Rolling walker (2 wheels) (stepping to and from the bedside chair) General ADL Comments: Pt reports having assist from his  spouse for bathing, dressing tasks at home secondary to history of CVA.  He does however report wanting to get better at this.  Decreased ability to reach down and donn/doff gripper socks.  Needed assist with placing the RUE on the hand rest of the RW prior to pushing up from the surface with the LUE.  Decreased ability to take adequate steps on the right with increased time needed and frequent posterior LOB.  Maintains flexed posture in trunk and head in standing.     Vision Baseline Vision/History: 3 Glaucoma (right eye blindness) Ability to See in Adequate Light: 1 Impaired Patient Visual Report: No change from baseline Additional Comments: Pt able to read the clock and the calendar on the while, primarily with left eye     Perception Perception: Not tested       Praxis Praxis: Not tested       Pertinent Vitals/Pain Pain Assessment Pain Assessment: No/denies pain     Extremity/Trunk Assessment Upper Extremity Assessment Upper Extremity Assessment: RUE deficits/detail RUE Deficits / Details: History of right hemiparesis.  Can demonstrate active elbow flexion/extension and digit flexion/extenson grossly.  Shoulder flexion limited with Brunnstrum stage IV pattern noted.  Unable to tolerate AAROM past approximately 80 degrees secondary to increased pain.  Slight increased flexor tone noted at elbow as well.  Uses functionally at a gross assist level and can hold onto walk with it during mobility. RUE Sensation: decreased light touch RUE Coordination: decreased fine motor;decreased gross motor   Lower Extremity Assessment Lower Extremity Assessment: Defer to PT evaluation   Cervical / Trunk Assessment Cervical / Trunk Assessment: Other exceptions Cervical / Trunk Exceptions: flexed head with head tilt at rest   Communication Communication Communication: No apparent difficulties   Cognition Arousal: Alert Behavior During Therapy: Flat affect Cognition: No family/caregiver present  to determine baseline                               Following commands: Impaired Following commands impaired: Only follows one step commands consistently, Follows multi-step commands inconsistently     Cueing  General Comments   Cueing Techniques: Verbal cues;Tactile cues;Gestural cues              Home Living Family/patient expects to be discharged to:: Private residence Living Arrangements: Spouse/significant other Available Help at Discharge: Family;Available PRN/intermittently Type of Home: House Home Access: Stairs to enter [employer] of Steps: 2 garage,4 front of house, rails in front only Entrance Stairs-Rails: Right;Left Home Layout: Two level;Bed/bath upstairs;1/2 bath on main level Alternate Level Stairs-Number of Steps: 14 Alternate Level Stairs-Rails: Left Bathroom Shower/Tub: Producer, television/film/video: Standard     Home Equipment: Agricultural consultant (2 wheels);Cane - single point;Wheelchair - manual;BSC/3in1;Shower seat   Additional Comments: BSC over toilet only. Stays upstairs, but comes down once per month for dinner or MD appointments.      Prior Functioning/Environment Prior Level of Function : Needs assist;History of Falls (last six months)             Mobility Comments: RW indoors with assist, can get up/down steps but needs quite a bit of help from family.  ADLs Comments: Needs help bathing set up and transfer but washes himself on shower seat. Spouse helps with all aspects of dressing too.    OT Problem List: Decreased activity tolerance;Impaired balance (sitting and/or standing);Decreased strength;Decreased coordination;Pain;Decreased knowledge of use of DME or AE;Impaired UE functional use;Impaired sensation   OT Treatment/Interventions: Self-care/ADL training;Patient/family education;Balance training;Neuromuscular education;Therapeutic activities;DME and/or AE instruction      OT Goals(Current goals can be  found in the care plan section)   Acute Rehab OT Goals Patient Stated Goal: Pt wants to be able to do more of his selfcare. OT Goal Formulation: With patient Time For Goal Achievement: 01/12/24 Potential to Achieve Goals: Fair   OT Frequency:  Min 1X/week       AM-PAC OT "6 Clicks" Daily Activity     Outcome Measure Help from another person eating meals?: A Little Help from another person taking care of personal grooming?: A Little Help from another person toileting, which includes using toliet, bedpan, or urinal?: A Lot Help from another person bathing (including washing, rinsing, drying)?: A Lot Help from another person to put on and taking off regular upper body clothing?: A Lot Help from another person to put on and taking off regular lower body clothing?: A Lot 6 Click Score: 14   End of Session Equipment Utilized During Treatment: Gait belt;Rolling walker (2 wheels) Nurse Communication: Mobility status  Activity Tolerance: Patient tolerated treatment well Patient left: in bed;with call bell/phone within reach  OT Visit Diagnosis: Unsteadiness on feet (R26.81);Other abnormalities of gait and mobility (R26.89);Repeated falls (R29.6);Muscle weakness (generalized) (M62.81)                Time: 6962-9528 OT Time Calculation (min): 30 min Charges:  OT General Charges $OT Visit: 1 Visit OT Evaluation $OT Eval Moderate Complexity: 1 Mod OT Treatments $Self Care/Home Management : 8-22 mins  Ardena Becker, OTR/L Acute Rehabilitation Services  Office [phone] 12/29/2023

## 2023-12-29 NOTE — NC FL2 (Signed)
 [place]  MEDICAID FL2 LEVEL OF CARE FORM     IDENTIFICATION  Patient Name: JACK CIBELLI Birthdate: 1951/07/26 Sex: male Admission Date (Current Location): 12/27/2023  [facility] and [place] Number:  Producer, television/film/video and Address:  [facility], [address], [place], [place] [address]      Provider Number: [id number]  Attending Physician Name and Address:  Verlie Glisson, MD  Relative Name and Phone Number:  Capers, Zierden   [phone]    Current Level of Care: Hospital Recommended Level of Care: Skilled Nursing Facility Prior Approval Number:    Date Approved/Denied:   PASRR Number: [id number]  Discharge Plan: SNF    Current Diagnoses: Patient Active Problem List   Diagnosis Date Noted   Peritonitis (HCC) 12/27/2023   ESRD on dialysis (HCC) 12/27/2023   Hypertensive urgency 12/27/2023   Normocytic anemia 12/27/2023   Legally blind 12/27/2023   Controlled diabetes mellitus with arthropathy, with long-term current use of insulin  (HCC) 07/14/2020   Hyperlipidemia 07/14/2020   Osteoarthritis 07/14/2020   Plantar fasciitis 07/14/2020   Stasis edema of both lower extremities 01/01/2019   Primary osteoarthritis of left shoulder 03/20/2018   Spondylosis of 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 myelopathy or radiculopathy 03/20/2018   Complex regional pain syndrome I of upper limb 06/27/2015   Dysphagia following cerebral infarction 02/15/2015   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)    CKD stage 2 due to type 2 diabetes mellitus (HCC) 02/12/2015   Right hemiparesis (HCC)    CKD (chronic kidney disease) stage 5, GFR less than 15 ml/min (HCC) 02/10/2015   Type 2 diabetes mellitus with diabetic neuropathy (HCC) 02/10/2015   Neck pain    Numbness and tingling 02/09/2015   Left shoulder pain 07/17/2012   Plantar fasciitis, bilateral 07/17/2012   Peripheral neuropathy 11/06/2007   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    Essential hypertension 07/08/2007   Extrinsic asthma 06/25/2007    Orientation RESPIRATION BLADDER Height & Weight     Self, Place, Situation  Normal Continent Weight: 145 lb 15.1 oz (66.2 kg) Height:  5\' 8"  (172.7 cm)  BEHAVIORAL SYMPTOMS/MOOD NEUROLOGICAL BOWEL NUTRITION STATUS      Continent Diet (See dc summary)  AMBULATORY STATUS COMMUNICATION OF NEEDS Skin   Extensive Assist Verbally PU Stage and Appropriate Care (Incision - 4 Ports Abdomen 1: Anterior;Mid;Upper 2: Anterior;Mid;Medial 3: Left;Upper;Anterior 4: Left;Mid;Anterior)                       Personal Care Assistance Level of Assistance  Bathing, Feeding, Dressing, Total care Bathing Assistance: Maximum assistance Feeding assistance: Limited assistance Dressing Assistance: Maximum assistance Total Care Assistance: Maximum assistance   Functional Limitations Info  Sight, Hearing, Speech Sight Info: Impaired Hearing Info: Adequate Speech Info: Impaired    SPECIAL CARE FACTORS FREQUENCY  PT (By licensed PT), OT (By licensed OT)     PT Frequency: 5x weekly OT Frequency: 5x weekly            Contractures      Additional Factors Info  Code Status, Allergies Code Status Info: Full Allergies Info: Lactose Intolerance (gi);Apple Pectin (pectin);Metformin  And Related; Peach (prunus Persica);Morphine  And Codeine           Current Medications (12/29/2023):  This is the current hospital active medication list Current Facility-Administered Medications  Medication Dose Route Frequency Provider Last Rate Last Admin   acetaminophen  (TYLENOL ) tablet 650 mg  650 mg Oral Q6H PRN Felipe Horton,  Rondell A, MD       Or   acetaminophen  (TYLENOL ) suppository 650 mg  650 mg Rectal Q6H PRN Felipe Horton, Rondell A, MD       albuterol (PROVENTIL) (2.5 MG/3ML) 0.083% nebulizer solution 2.5 mg  2.5 mg Nebulization Q6H PRN Smith, Rondell A, MD       [START ON 12/30/2023] amLODipine  (NORVASC ) tablet 10 mg  10 mg Oral Daily Samtani,  Jai-Gurmukh, MD       atorvastatin  (LIPITOR) tablet 10 mg  10 mg Oral q morning Smith, Rondell A, MD   10 mg at 12/29/23 1027   buPROPion  (WELLBUTRIN  XL) 24 hr tablet 300 mg  300 mg Oral q morning Felipe Horton, Rondell A, MD   300 mg at 12/29/23 1025   calcitRIOL  (ROCALTROL ) capsule 0.5 mcg  0.5 mcg Oral Daily Peeples, Samuel J, MD   0.5 mcg at 12/29/23 1025   ceFEPIme  (MAXIPIME ) 1 g in sodium chloride  0.9 % 100 mL IVPB  1 g Intravenous Q2200 Smith, Rondell A, MD 200 mL/hr at 12/29/23 0047 1 g at 12/29/23 0047   Chlorhexidine  Gluconate Cloth 2 % PADS 6 each  6 each Topical Q0600 [name], MD   6 each at 12/29/23 0542   chlorproMAZINE  (THORAZINE ) tablet 25 mg  25 mg Oral Daily PRN Smith, Rondell A, MD       fentaNYL  (SUBLIMAZE ) injection 25 mcg  25 mcg Intravenous Q2H PRN Smith, Rondell A, MD       gabapentin  (NEURONTIN ) capsule 300 mg  300 mg Oral QHS Samtani, Jai-Gurmukh, MD       gentamicin  cream (GARAMYCIN ) 0.1 % 1 Application  1 Application Topical Daily Peeples, Samuel J, MD       heparin  injection 5,000 Units  5,000 Units Subcutaneous Q8H Smith, Rondell A, MD   5,000 Units at 12/29/23 0541   hydrALAZINE (APRESOLINE) injection 10 mg  10 mg Intravenous Q4H PRN Smith, Rondell A, MD       insulin  aspart (novoLOG ) injection 0-15 Units  0-15 Units Subcutaneous TID WC Smith, Rondell A, MD   3 Units at 12/28/23 1810   insulin  aspart (novoLOG ) injection 8 Units  8 Units Subcutaneous Once Smith, Rondell A, MD       insulin  glargine-yfgn (SEMGLEE ) injection 18 Units  18 Units Subcutaneous QHS Smith, Rondell A, MD   18 Units at 12/29/23 0047   losartan  (COZAAR ) tablet 100 mg  100 mg Oral Daily Peeples, Samuel J, MD   100 mg at 12/29/23 1024   metoprolol  tartrate (LOPRESSOR ) tablet 50 mg  50 mg Oral BID Smith, Rondell A, MD   50 mg at 12/29/23 1027   ondansetron  (ZOFRAN ) tablet 4 mg  4 mg Oral Q6H PRN Smith, Rondell A, MD       Or   ondansetron  (ZOFRAN ) injection 4 mg  4 mg Intravenous Q6H PRN [name] A, MD       pantoprazole  (PROTONIX ) EC tablet 40 mg  40 mg Oral Daily Smith, Rondell A, MD   40 mg at 12/29/23 1026   potassium chloride  SA (KLOR-CON  M) CR tablet 40 mEq  40 mEq Oral Daily Samtani, Jai-Gurmukh, MD   40 mEq at 12/29/23 1026   sodium chloride  flush (NS) 0.9 % injection 10-40 mL  10-40 mL Intracatheter Q12H Smith, Rondell A, MD   10 mL at 12/27/23 2320   sodium chloride  flush (NS) 0.9 % injection 10-40 mL  10-40 mL Intracatheter PRN Lena Qualia, MD  sodium chloride  flush (NS) 0.9 % injection 3 mL  3 mL Intravenous Q12H Smith, Rondell A, MD   3 mL at 12/29/23 1033   torsemide (DEMADEX) tablet 60 mg  60 mg Oral Daily Peeples, Samuel J, MD   60 mg at 12/29/23 1026   [START ON 12/31/2023] vancomycin  (VANCOCIN ) 750 mg in sodium chloride  0.9 % 250 mL IVPB  750 mg Intravenous Q T,Th,Sa-HD Williamson, Erin R, [facility]         Discharge Medications: Please see discharge summary for a list of discharge medications.  Relevant Imaging Results:  Relevant Lab Results:   Additional Information SSN#: [id number]  Murphy Arn, LCSWA

## 2023-12-29 NOTE — Anesthesia Preprocedure Evaluation (Signed)
 Anesthesia Evaluation  Patient identified by MRN, date of birth, ID band Patient awake    Reviewed: Allergy & Precautions, NPO status , Patient's Chart, lab work & pertinent test results, reviewed documented beta blocker date and time   History of Anesthesia Complications Negative for: history of anesthetic complications  Airway Mallampati: III  TM Distance: >3 FB Neck ROM: Full    Dental  (+) Dental Advisory Given, Missing   Pulmonary asthma    Pulmonary exam normal        Cardiovascular hypertension, Pt. on medications and Pt. on home beta blockers Normal cardiovascular exam     Neuro/Psych  Right eye blindness   Neuromuscular disease CVA, No Residual Symptoms  negative psych ROS   GI/Hepatic negative GI ROS, Neg liver ROS,,,  Endo/Other  diabetes, Type 2, Insulin  Dependent, Oral Hypoglycemic Agents    Renal/GU ESRF and DialysisRenal disease     Musculoskeletal  (+) Arthritis ,    Abdominal   Peds  Hematology  (+) Blood dyscrasia, anemia  Ca 7.7    Anesthesia Other Findings   Reproductive/Obstetrics                             Anesthesia Physical Anesthesia Plan  ASA: 3  Anesthesia Plan: General   Post-op Pain Management: Tylenol  PO (pre-op)*   Induction: Intravenous  PONV Risk Score and Plan: 2 and Treatment may vary due to age or medical condition, Ondansetron  and Dexamethasone   Airway Management Planned: LMA  Additional Equipment: None  Intra-op Plan:   Post-operative Plan: Extubation in OR  Informed Consent: I have reviewed the patients History and Physical, chart, labs and discussed the procedure including the risks, benefits and alternatives for the proposed anesthesia with the patient or authorized representative who has indicated his/her understanding and acceptance.     Dental advisory given  Plan Discussed with: CRNA and Anesthesiologist  Anesthesia  Plan Comments: (LMA vs. MAC, discuss with surgeon)        Anesthesia Quick Evaluation

## 2023-12-29 NOTE — Progress Notes (Signed)
 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 needing core track and CIR evaluation at the time HLD ESRD previously on PD since 05/2023 dry weight 75 Performed chronic disease secondary hyperparathyroidism--previous complications with PD having Enterococcus faecalis peritonitis treated with vancomycin  Developed symptoms of peritonitis 4/15 patient was started on cefepime  vancomycin  switched to ceftazidime-developed diarrhea nausea multiple vomiting episodes Directed to come to ED 4/25 by Nephrology on-call service--- PD fluid check on 4/15 showed many gram-negative coccobacilli rare gram-positive cocci in pairs and clusters with resistance to ampicillin cefazolin  Sodium 137 potassium 3.4 BUN/creatinine 25/2.6 calcium  8 phosphorus 2.6 LFTs AST 45 ALT 38 Lactic acid cycling 1.9-2.1 WBC 6.7 hemoglobin 8.1 platelet 426--BC obtained, peritoneal fluid examined showing 95 nucleated cells 91% neutrophils cloudy appearance CT ABD pelvis peritoneal dialysis with loculated peritoneal fluid collections in ventral abdomen tracking along right liver?  Subcapsular involvement given degree of right liver scalloping-small volume pleural fluid with atelectasis right more than left base His primary nephrologist who is working consulted and saw the patient Vascular surgeon evaluated patient for permanent access placement eventually Patient had a temporary trialysis catheter placed but complication of bleeding from left jugular catheter site--desmopressin  was given X2   Plan  Sepsis 2/2 polymicrobial dialysate infection--localized not bacteremic--source infected PD cath--for removal 4/28 Gram-negative  coccobacilli/gram-positive cocci pair/cluster on 4/15--growing gram-positive cocci 4/25, await speciation-keep on IV cefepime /vancomycin  renally dosed BC x2 negative from 4/25 Sepsis physiology seems resolved  Bleeding from left trialysis jugular site--temp cath placed for iHD Desmopressin  X2-appreciate follow-up by IR-hemoglobin has dropped from baseline of 9-7.6-erythropoietin agents Holding aspirin  81 at this time for now  Hypokalemia/ESRD now on HD instead of PD-hyper parathyroid, anemia renal disease Trialysis access catheter placed 4/25-dialysis through this Permanent access plan per vascular on 4/28  not taking calcitriol  0.25 daily-reordered holding losartan  100 holding chlorthalidone torsemide daily continue metoprolol  50 twice daily increased amlodipine  5--10  Mildly elevated LFT/transaminitis, mild thrombocytosis Both secondary to likely infection trend to normal with labs as able  DM ty 2 At home 8 units 3 times daily which we will continue--continue Lantus  18 CBGs ranging 104--140  holding Glucotrol  XL 5  Previous stroke 2016, HLD Dense R side deficit--using wc for the past 3 mo and debilitated--therapy to see--might require skilled placeemnt  DVT prophylaxis: heparin   Status is: Inpatient Remains inpatient appropriate because:   Requires further care--updated brother 4/26   Subjective:  Coherent well nad no issue Hungry--not sure if he ordered food. No fever overnight  Objective + exam Vitals:   12/29/23 0041 12/29/23 0528 12/29/23 0751 12/29/23 1020  BP: (!) 140/85 (!) 148/83 (!) 168/84 (!) 172/88  Pulse: 93 94 97 93  Resp: 16 18 17    Temp: (!) 97.5 F (36.4 C) 98.3 F (36.8 C)  98.3 F (36.8 C)  TempSrc: Oral Oral  Oral  SpO2: 100% 100% 100% 100%  Weight:      Height:       Filed Weights   12/28/23 0500 12/28/23 2028 12/28/23 2357  Weight: 79.4 kg 70.4 kg 66.2 kg    Examination:  Awake coherent in nad Cta b no wheeze--HD caht in place in  chest Cta b no added sound Abd  soft nt--PD cath in place Densely weaker on R side, some contractures R hand and weaker R arm> L arm  Data Reviewed: reviewed   CBC    Component Value Date/Time   WBC 10.6 (H) 12/29/2023 0502   RBC 2.95 (L) 12/29/2023 0502   HGB 7.6 (L) 12/29/2023 0502   HCT 24.5 (L) 12/29/2023 0502   PLT 373 12/29/2023 0502   MCV 83.1 12/29/2023 0502   MCH 25.8 (L) 12/29/2023 0502   MCHC 31.0 12/29/2023 0502   RDW 18.4 (H) 12/29/2023 0502   LYMPHSABS 1.5 12/29/2023 0502   MONOABS 0.8 12/29/2023 0502   EOSABS 0.1 12/29/2023 0502   BASOSABS 0.0 12/29/2023 0502      Latest Ref Rng & Units 12/29/2023    5:02 AM 12/28/2023    2:29 AM 12/27/2023    5:43 AM  CMP  Glucose 70 - 99 mg/dL 191  478  295   BUN 8 - 23 mg/dL 22  26  25    Creatinine 0.61 - 1.24 mg/dL 6.21  3.08  6.57   Sodium 135 - 145 mmol/L 139  143  137   Potassium 3.5 - 5.1 mmol/L 3.3  3.0  3.4   Chloride 98 - 111 mmol/L 100  97  94   CO2 22 - 32 mmol/L 30  34  28   Calcium  8.9 - 10.3 mg/dL 7.4  7.7  8.0   Total Protein 6.5 - 8.1 g/dL   7.2   Total Bilirubin 0.0 - 1.2 mg/dL   0.6   Alkaline Phos 38 - 126 U/L   108   AST 15 - 41 U/L   45   ALT 0 - 44 U/L   38     Scheduled Meds:  amLODipine   5 mg Oral Daily   atorvastatin   10 mg Oral q morning   buPROPion   300 mg Oral q morning   calcitRIOL   0.5 mcg Oral Daily   Chlorhexidine  Gluconate Cloth  6 each Topical Q0600   gabapentin   300 mg Oral QHS   gentamicin  cream  1 Application Topical Daily   heparin  injection (subcutaneous)  5,000 Units Subcutaneous Q8H   insulin  aspart  0-15 Units Subcutaneous TID WC   insulin  aspart  8 Units Subcutaneous Once   insulin  glargine-yfgn  18 Units Subcutaneous QHS   losartan   100 mg Oral Daily   metoprolol  tartrate  50 mg Oral BID   pantoprazole   40 mg Oral Daily   potassium chloride   40 mEq Oral Daily   sodium chloride  flush  10-40 mL Intracatheter Q12H   sodium chloride  flush  3 mL Intravenous Q12H    torsemide  60 mg Oral Daily   Continuous Infusions:  ceFEPime  (MAXIPIME ) IV 1 g (12/29/23 0047)   [START ON 12/31/2023] vancomycin  (VANCOCIN ) 750 mg in sodium chloride  0.9 % 250 mL IVPB      Time  24  Verlie Glisson, MD  [facility]

## 2023-12-29 NOTE — Progress Notes (Signed)
 Nephrology Follow-Up Consult note   Assessment/Recommendations: Russell Austin is a/an 73 y.o. male with a past medical history significant for ESRD, admitted for complicated peritonitis.       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  Plan was to switch to hemodialysis in the outpatient setting now with significant symptoms related to peritonitis and loculated appearance.   - Temporary dialysis catheter placed on 4/25 with IR, appreciate help  - Under dialysis on 4/26, plan to continue on TTS schedule - PD catheter removal on Monday with vascular surgery as well as tunneled dialysis catheter, appreciate help  - Management of peritonitis as below  - CLIP next week, will need to determine SNF first   # Volume/ hypertension: Dry weight around 75 kg but likely has lost weight over time and his actual weight is much lower.  Volume overload on admission now much improved with ultrafiltration as well as diuretics.  Switch IV diuretics to oral torsemide 60 mg daily.  Continue with ultrafiltration on dialysis.  Continue current blood pressure medications   # Anemia of Chronic Kidney Disease: Hemoglobin 8.1. Avoid iron given infection.  ESA ordered with hemodialysis   # Secondary Hyperparathyroidism/Hyperphosphatemia: continue home calcitriol  0.5mcg daily. Phos at goal   # Vascular access: Temporary catheter in place.  PD catheter removal and TDC on Monday with vascular surgery.  Eventually will need a graft versus fistula.  I will get vein mapping over the weekend   # Loculated Peritonitis: polymicrobial with enterococcus, proteus, and kleb pneumo. This raises concern for some possible intestinal tear but given his appearance it seems unlikely. Will cover with vanc and cefepime ; given previous regimen did not cover all bacteria appropriately start date will be 4/25 or when PD catheter is removed tomorrow and likely needs treatment for  at least 3 weeks. Appreciate help from [facility]    # N/v/d: treat symptomatically per primary. Likely related to peritonitis. Improving   Recommendations conveyed to primary service.    [name] [facility] 12/29/2023 8:43 AM  ___________________________________________________________  CC: Nausea and vomiting, diarrhea  Interval History/Subjective: Patient states he continues to have some abdominal pain.  No nausea or vomiting.  Tolerated dialysis yesterday with no issues   Medications:  Current Facility-Administered Medications  Medication Dose Route Frequency Provider Last Rate Last Admin   acetaminophen  (TYLENOL ) tablet 650 mg  650 mg Oral Q6H PRN Smith, Rondell A, MD       Or   acetaminophen  (TYLENOL ) suppository 650 mg  650 mg Rectal Q6H PRN Felipe Horton, Rondell A, MD       albuterol (PROVENTIL) (2.5 MG/3ML) 0.083% nebulizer solution 2.5 mg  2.5 mg Nebulization Q6H PRN Smith, Rondell A, MD       amLODipine  (NORVASC ) tablet 5 mg  5 mg Oral Daily Smith, Rondell A, MD   5 mg at 12/28/23 0903   atorvastatin  (LIPITOR) tablet 10 mg  10 mg Oral q morning Smith, Rondell A, MD   10 mg at 12/28/23 0272   buPROPion  (WELLBUTRIN  XL) 24 hr tablet 300 mg  300 mg Oral q morning Smith, Rondell A, MD   300 mg at 12/28/23 0903   calcitRIOL  (ROCALTROL ) capsule 0.5 mcg  0.5 mcg Oral Daily Eliska Hamil J, MD   0.5 mcg at 12/28/23 5366   ceFEPIme  (MAXIPIME ) 1 g in sodium chloride  0.9 % 100 mL IVPB  1 g Intravenous Q2200 Lena Qualia, MD  200 mL/hr at 12/29/23 0047 1 g at 12/29/23 0047   Chlorhexidine  Gluconate Cloth 2 % PADS 6 each  6 each Topical Q0600 [name], MD   6 each at 12/29/23 0542   chlorproMAZINE  (THORAZINE ) tablet 25 mg  25 mg Oral Daily PRN Smith, Rondell A, MD       fentaNYL  (SUBLIMAZE ) injection 25 mcg  25 mcg Intravenous Q2H PRN Smith, Rondell A, MD       gabapentin  (NEURONTIN ) capsule 300 mg  300 mg Oral QHS Samtani, Jai-Gurmukh, MD       gentamicin   cream (GARAMYCIN ) 0.1 % 1 Application  1 Application Topical Daily Bon Dowis J, MD       heparin  injection 5,000 Units  5,000 Units Subcutaneous Q8H Smith, Rondell A, MD   5,000 Units at 12/29/23 0541   hydrALAZINE (APRESOLINE) injection 10 mg  10 mg Intravenous Q4H PRN Smith, Rondell A, MD       insulin  aspart (novoLOG ) injection 0-15 Units  0-15 Units Subcutaneous TID WC Smith, Rondell A, MD   3 Units at 12/28/23 1810   insulin  aspart (novoLOG ) injection 8 Units  8 Units Subcutaneous Once Smith, Rondell A, MD       insulin  glargine-yfgn (SEMGLEE ) injection 18 Units  18 Units Subcutaneous QHS Smith, Rondell A, MD   18 Units at 12/29/23 0047   losartan  (COZAAR ) tablet 100 mg  100 mg Oral Daily Dejion Grillo J, MD   100 mg at 12/28/23 0903   metoprolol  tartrate (LOPRESSOR ) tablet 50 mg  50 mg Oral BID Smith, Rondell A, MD   50 mg at 12/28/23 9629   ondansetron  (ZOFRAN ) tablet 4 mg  4 mg Oral Q6H PRN Smith, Rondell A, MD       Or   ondansetron  (ZOFRAN ) injection 4 mg  4 mg Intravenous Q6H PRN Smith, Rondell A, MD       pantoprazole  (PROTONIX ) EC tablet 40 mg  40 mg Oral Daily Felipe Horton, Rondell A, MD   40 mg at 12/28/23 5284   potassium chloride  SA (KLOR-CON  M) CR tablet 40 mEq  40 mEq Oral Daily Samtani, Jai-Gurmukh, MD   40 mEq at 12/28/23 1324   sodium chloride  flush (NS) 0.9 % injection 10-40 mL  10-40 mL Intracatheter Q12H Smith, Rondell A, MD   10 mL at 12/27/23 2320   sodium chloride  flush (NS) 0.9 % injection 10-40 mL  10-40 mL Intracatheter PRN [name] A, MD       sodium chloride  flush (NS) 0.9 % injection 3 mL  3 mL Intravenous Q12H Smith, Rondell A, MD   3 mL at 12/28/23 1000   torsemide (DEMADEX) tablet 60 mg  60 mg Oral Daily Madiline Saffran J, MD       [START ON 12/31/2023] vancomycin  (VANCOCIN ) 750 mg in sodium chloride  0.9 % 250 mL IVPB  750 mg Intravenous Q T,Th,Sa-HD Baldemar Lev, RPH          Review of Systems: 10 systems reviewed and negative except per  interval history/subjective  Physical Exam: Vitals:   12/29/23 0528 12/29/23 0751  BP: (!) 148/83 (!) 168/84  Pulse: 94 97  Resp: 18 17  Temp: 98.3 F (36.8 C)   SpO2: 100% 100%   No intake/output data recorded.  Intake/Output Summary (Last 24 hours) at 12/29/2023 0843 Last data filed at 12/29/2023 0400 Gross per 24 hour  Intake 560 ml  Output 4900 ml  Net -4340 ml   Constitutional: Tired appearing, lying  in bed, no acute distress ENMT: ears and nose without scars or lesions, MMM CV: normal rate, trace edema in the lower extremities Respiratory: Bilateral chest rise, normal work of breathing Gastrointestinal: soft, non-tender, no palpable masses or hernias Skin: no visible lesions or rashes Psych: alert, judgement/insight appropriate, appropriate mood and affect   Test Results I personally reviewed new and old clinical labs and radiology tests Lab Results  Component Value Date   NA 139 12/29/2023   K 3.3 (L) 12/29/2023   CL 100 12/29/2023   CO2 30 12/29/2023   BUN 22 12/29/2023   CREATININE 2.13 (H) 12/29/2023   GFR 10.52 (LL) 02/28/2023   CALCIUM  7.4 (L) 12/29/2023   ALBUMIN  1.8 (L) 12/28/2023   PHOS 2.1 (L) 12/28/2023    CBC Recent Labs  Lab 12/27/23 0543 12/28/23 0229 12/29/23 0502  WBC 6.7 9.8 10.6*  NEUTROABS  --   --  8.2*  HGB 8.1* 7.4* 7.6*  HCT 26.4* 23.8* 24.5*  MCV 83.8 82.6 83.1  PLT 426* 426* 373

## 2023-12-29 NOTE — Progress Notes (Signed)
 [facility]  Subjective  - no complaints.  No growth on blood cultures.   Objective (!) 168/84 97 98.3 F (36.8 C) (Oral) 17 100%  Intake/Output Summary (Last 24 hours) at 12/29/2023 0956 Last data filed at 12/29/2023 0400 Gross per 24 hour  Intake 560 ml  Output 4900 ml  Net -4340 ml    PD catheter in place No significant rebound or guarding  Laboratory Lab Results: Recent Labs    12/28/23 0229 12/29/23 0502  WBC 9.8 10.6*  HGB 7.4* 7.6*  HCT 23.8* 24.5*  PLT 426* 373   BMET Recent Labs    12/28/23 0229 12/29/23 0502  NA 143 139  K 3.0* 3.3*  CL 97* 100  CO2 34* 30  GLUCOSE 194* 118*  BUN 26* 22  CREATININE 2.44* 2.13*  CALCIUM  7.7* 7.4*    COAG Lab Results  Component Value Date   INR 1.06 02/10/2015   INR 1.05 02/09/2015   No results found for: "PTT"  Assessment/Planning:  Discussed plan for removal of PD catheter and placement of tunneled dialysis catheter on Monday in the OR given concern for peritonitis with his PD catheter - PD cultures positive.  Temporary line was placed IR.  All questions answered.  N.p.o. at midnight.  Consent order placed.  OR tomorrow with Dr. Rosalva Comber.  Russell Austin 12/29/2023 9:56 AM --

## 2023-12-30 ENCOUNTER — Inpatient Hospital Stay (HOSPITAL_COMMUNITY): Payer: Self-pay | Admitting: Anesthesiology

## 2023-12-30 ENCOUNTER — Encounter (HOSPITAL_COMMUNITY): Payer: Self-pay | Admitting: Internal Medicine

## 2023-12-30 ENCOUNTER — Inpatient Hospital Stay (HOSPITAL_COMMUNITY)

## 2023-12-30 ENCOUNTER — Encounter (HOSPITAL_COMMUNITY)
Admission: EM | Disposition: A | Discharge status: Skilled Nursing Facility | Payer: Self-pay | Source: Home / Self Care | Attending: Internal Medicine

## 2023-12-30 DIAGNOSIS — T827XXA Infection and inflammatory reaction due to other cardiac and vascular devices, implants and grafts, initial encounter: Secondary | ICD-10-CM

## 2023-12-30 DIAGNOSIS — K659 Peritonitis, unspecified: Secondary | ICD-10-CM

## 2023-12-30 HISTORY — PX: INSERTION OF DIALYSIS CATHETER: SHX1324

## 2023-12-30 HISTORY — PX: CAPD REMOVAL: SHX5234

## 2023-12-30 LAB — CBC WITH DIFFERENTIAL/PLATELET
Abs Immature Granulocytes: 0.02 10*3/uL (ref 0.00–0.07)
Basophils Absolute: 0 10*3/uL (ref 0.0–0.1)
Basophils Relative: 0 %
Eosinophils Absolute: 0.3 10*3/uL (ref 0.0–0.5)
Eosinophils Relative: 4 %
HCT: 23.6 % — ABNORMAL LOW (ref 39.0–52.0)
Hemoglobin: 7.2 g/dL — ABNORMAL LOW (ref 13.0–17.0)
Immature Granulocytes: 0 %
Lymphocytes Relative: 13 %
Lymphs Abs: 1 10*3/uL (ref 0.7–4.0)
MCH: 25.4 pg — ABNORMAL LOW (ref 26.0–34.0)
MCHC: 30.5 g/dL (ref 30.0–36.0)
MCV: 83.1 fL (ref 80.0–100.0)
Monocytes Absolute: 0.8 10*3/uL (ref 0.1–1.0)
Monocytes Relative: 11 %
Neutro Abs: 5.2 10*3/uL (ref 1.7–7.7)
Neutrophils Relative %: 72 %
Platelets: 333 10*3/uL (ref 150–400)
RBC: 2.84 MIL/uL — ABNORMAL LOW (ref 4.22–5.81)
RDW: 18.4 % — ABNORMAL HIGH (ref 11.5–15.5)
WBC: 7.3 10*3/uL (ref 4.0–10.5)
nRBC: 0 % (ref 0.0–0.2)

## 2023-12-30 LAB — BASIC METABOLIC PANEL WITH GFR
Anion gap: 12 (ref 5–15)
BUN: 29 mg/dL — ABNORMAL HIGH (ref 8–23)
CO2: 29 mmol/L (ref 22–32)
Calcium: 7.7 mg/dL — ABNORMAL LOW (ref 8.9–10.3)
Chloride: 101 mmol/L (ref 98–111)
Creatinine, Ser: 2.7 mg/dL — ABNORMAL HIGH (ref 0.61–1.24)
GFR, Estimated: 24 mL/min — ABNORMAL LOW (ref >60)
Glucose, Bld: 56 mg/dL — ABNORMAL LOW (ref 70–99)
Potassium: 3.5 mmol/L (ref 3.5–5.1)
Sodium: 142 mmol/L (ref 135–145)

## 2023-12-30 LAB — GLUCOSE, CAPILLARY
Glucose-Capillary: 107 mg/dL — ABNORMAL HIGH (ref 70–99)
Glucose-Capillary: 52 mg/dL — ABNORMAL LOW (ref 70–99)
Glucose-Capillary: 59 mg/dL — ABNORMAL LOW (ref 70–99)
Glucose-Capillary: 90 mg/dL (ref 70–99)
Glucose-Capillary: 123 mg/dL — ABNORMAL HIGH (ref 70–99)
Glucose-Capillary: 165 mg/dL — ABNORMAL HIGH (ref 70–99)
Glucose-Capillary: 235 mg/dL — ABNORMAL HIGH (ref 70–99)

## 2023-12-30 LAB — HEPATIC FUNCTION PANEL
ALT: 19 U/L (ref 0–44)
AST: 22 U/L (ref 15–41)
Albumin: 1.8 g/dL — ABNORMAL LOW (ref 3.5–5.0)
Alkaline Phosphatase: 89 U/L (ref 38–126)
Bilirubin, Direct: 0.2 mg/dL (ref 0.0–0.2)
Indirect Bilirubin: 0.7 mg/dL (ref 0.3–0.9)
Total Bilirubin: 0.9 mg/dL (ref 0.0–1.2)
Total Protein: 6.3 g/dL — ABNORMAL LOW (ref 6.5–8.1)

## 2023-12-30 LAB — PATHOLOGIST SMEAR REVIEW

## 2023-12-30 SURGERY — CONTINUOUS AMBULATORY PERITONEAL DIALYSIS  (CAPD) CATHETER REMOVAL
Anesthesia: General | Site: Chest

## 2023-12-30 MED ORDER — CEFAZOLIN SODIUM-DEXTROSE 2-3 GM-%(50ML) IV SOLR
INTRAVENOUS | Status: DC | PRN
  Administered 2023-12-30: 2 g via INTRAVENOUS

## 2023-12-30 MED ORDER — ORAL CARE MOUTH RINSE: 15.0000 mL | Freq: Once | OROMUCOSAL | Status: AC

## 2023-12-30 MED ORDER — ROCURONIUM BROMIDE 10 MG/ML (PF) SYRINGE
PREFILLED_SYRINGE | INTRAVENOUS | Status: DC | PRN
  Administered 2023-12-30: 10 mg via INTRAVENOUS
  Administered 2023-12-30: 40 mg via INTRAVENOUS

## 2023-12-30 MED ORDER — SODIUM CHLORIDE 0.9 % IV SOLN: INTRAVENOUS | Status: DC

## 2023-12-30 MED ORDER — CHLORHEXIDINE GLUCONATE 0.12 % MT SOLN: 15.0000 mL | Freq: Once | OROMUCOSAL | Status: AC

## 2023-12-30 MED ORDER — HEPARIN 6000 UNIT IRRIGATION SOLUTION
Status: AC
  Filled 2023-12-30: qty 500

## 2023-12-30 MED ORDER — DEXTROSE 50 % IV SOLN
12.5000 g | Freq: Once | INTRAVENOUS | Status: AC
  Administered 2023-12-30: 12.5 g via INTRAVENOUS

## 2023-12-30 MED ORDER — AMLODIPINE BESYLATE 5 MG PO TABS
5.0000 mg | ORAL_TABLET | Freq: Every day | ORAL | Status: DC
Start: 2023-12-31 — End: 2024-01-05
  Administered 2023-12-31 – 2024-01-05 (×5): 5 mg via ORAL
  Filled 2023-12-30 (×6): qty 1

## 2023-12-30 MED ORDER — DEXTROSE 50 % IV SOLN
12.5000 g | INTRAVENOUS | Status: AC
  Administered 2023-12-30: 12.5 g via INTRAVENOUS

## 2023-12-30 MED ORDER — EPHEDRINE 5 MG/ML INJ
INTRAVENOUS | Status: AC
  Filled 2023-12-30: qty 5

## 2023-12-30 MED ORDER — FENTANYL CITRATE (PF) 100 MCG/2ML IJ SOLN: 25.0000 ug | INTRAMUSCULAR | Status: DC | PRN

## 2023-12-30 MED ORDER — INSULIN ASPART 100 UNIT/ML IJ SOLN: 0.0000 [IU] | INTRAMUSCULAR | Status: DC | PRN

## 2023-12-30 MED ORDER — HEPARIN SODIUM (PORCINE) 1000 UNIT/ML IJ SOLN
INTRAMUSCULAR | Status: DC | PRN
  Administered 2023-12-30: 4200 [IU]

## 2023-12-30 MED ORDER — EPHEDRINE SULFATE-NACL 50-0.9 MG/10ML-% IV SOSY
PREFILLED_SYRINGE | INTRAVENOUS | Status: DC | PRN
  Administered 2023-12-30: 5 mg via INTRAVENOUS
  Administered 2023-12-30: 10 mg via INTRAVENOUS
  Administered 2023-12-30: 5 mg via INTRAVENOUS

## 2023-12-30 MED ORDER — LIDOCAINE 2% (20 MG/ML) 5 ML SYRINGE
INTRAMUSCULAR | Status: DC | PRN
  Administered 2023-12-30: 40 mg via INTRAVENOUS

## 2023-12-30 MED ORDER — PROPOFOL 10 MG/ML IV BOLUS
INTRAVENOUS | Status: AC
  Filled 2023-12-30: qty 20

## 2023-12-30 MED ORDER — PROPOFOL 10 MG/ML IV BOLUS
INTRAVENOUS | Status: DC | PRN
  Administered 2023-12-30: 100 mg via INTRAVENOUS

## 2023-12-30 MED ORDER — ONDANSETRON HCL 4 MG/2ML IJ SOLN
INTRAMUSCULAR | Status: DC | PRN
  Administered 2023-12-30: 4 mg via INTRAVENOUS

## 2023-12-30 MED ORDER — HEPARIN SODIUM (PORCINE) 5000 UNIT/ML IJ SOLN
5000.0000 [IU] | Freq: Three times a day (TID) | INTRAMUSCULAR | Status: DC
  Administered 2023-12-31 – 2024-01-05 (×16): 5000 [IU] via SUBCUTANEOUS
  Filled 2023-12-30 (×16): qty 1

## 2023-12-30 MED ORDER — 0.9 % SODIUM CHLORIDE (POUR BTL) OPTIME
TOPICAL | Status: DC | PRN
  Administered 2023-12-30: 1000 mL

## 2023-12-30 MED ORDER — INSULIN ASPART 100 UNIT/ML IJ SOLN: 4.0000 [IU] | Freq: Three times a day (TID) | INTRAMUSCULAR | Status: DC

## 2023-12-30 MED ORDER — INSULIN ASPART 100 UNIT/ML IJ SOLN
2.0000 [IU] | Freq: Three times a day (TID) | INTRAMUSCULAR | Status: DC
  Administered 2023-12-30 – 2024-01-05 (×15): 2 [IU] via SUBCUTANEOUS

## 2023-12-30 MED ORDER — LIDOCAINE 2% (20 MG/ML) 5 ML SYRINGE
INTRAMUSCULAR | Status: AC
  Filled 2023-12-30: qty 5

## 2023-12-30 MED ORDER — ONDANSETRON HCL 4 MG/2ML IJ SOLN
INTRAMUSCULAR | Status: AC
  Filled 2023-12-30: qty 2

## 2023-12-30 MED ORDER — DEXTROSE 50 % IV SOLN
INTRAVENOUS | Status: AC
  Filled 2023-12-30: qty 50

## 2023-12-30 MED ORDER — FENTANYL CITRATE (PF) 250 MCG/5ML IJ SOLN
INTRAMUSCULAR | Status: DC | PRN
  Administered 2023-12-30: 50 ug via INTRAVENOUS

## 2023-12-30 MED ORDER — SUGAMMADEX SODIUM 200 MG/2ML IV SOLN
INTRAVENOUS | Status: DC | PRN
  Administered 2023-12-30: 200 mg via INTRAVENOUS

## 2023-12-30 MED ORDER — OXYCODONE HCL 5 MG PO TABS: 5.0000 mg | ORAL_TABLET | Freq: Once | ORAL | Status: DC | PRN

## 2023-12-30 MED ORDER — CHLORHEXIDINE GLUCONATE CLOTH 2 % EX PADS
6.0000 | MEDICATED_PAD | Freq: Every day | CUTANEOUS | Status: DC
  Administered 2024-01-01 – 2024-01-02 (×2): 6 via TOPICAL

## 2023-12-30 MED ORDER — FENTANYL CITRATE (PF) 250 MCG/5ML IJ SOLN
INTRAMUSCULAR | Status: AC
  Filled 2023-12-30: qty 5

## 2023-12-30 MED ORDER — HEPARIN 6000 UNIT IRRIGATION SOLUTION
Status: DC | PRN
  Administered 2023-12-30: 1

## 2023-12-30 MED ORDER — ONDANSETRON HCL 4 MG/2ML IJ SOLN: 4.0000 mg | Freq: Once | INTRAMUSCULAR | Status: DC | PRN

## 2023-12-30 MED ORDER — CHLORHEXIDINE GLUCONATE 0.12 % MT SOLN
OROMUCOSAL | Status: AC
  Administered 2023-12-30: 15 mL via OROMUCOSAL
  Filled 2023-12-30: qty 15

## 2023-12-30 MED ORDER — HEPARIN SODIUM (PORCINE) 1000 UNIT/ML IJ SOLN
INTRAMUSCULAR | Status: AC
  Filled 2023-12-30: qty 10

## 2023-12-30 MED ORDER — PHENYLEPHRINE 80 MCG/ML (10ML) SYRINGE FOR IV PUSH (FOR BLOOD PRESSURE SUPPORT)
PREFILLED_SYRINGE | INTRAVENOUS | Status: AC
  Filled 2023-12-30: qty 10

## 2023-12-30 MED ORDER — OXYCODONE HCL 5 MG/5ML PO SOLN: 5.0000 mg | Freq: Once | ORAL | Status: DC | PRN

## 2023-12-30 MED ORDER — ROCURONIUM BROMIDE 10 MG/ML (PF) SYRINGE
PREFILLED_SYRINGE | INTRAVENOUS | Status: AC
  Filled 2023-12-30: qty 10

## 2023-12-30 MED ORDER — ACETAMINOPHEN 500 MG PO TABS
1000.0000 mg | ORAL_TABLET | Freq: Once | ORAL | Status: AC
  Administered 2023-12-30: 1000 mg via ORAL
  Filled 2023-12-30: qty 2

## 2023-12-30 MED ORDER — PHENYLEPHRINE 80 MCG/ML (10ML) SYRINGE FOR IV PUSH (FOR BLOOD PRESSURE SUPPORT)
PREFILLED_SYRINGE | INTRAVENOUS | Status: DC | PRN
  Administered 2023-12-30: 80 ug via INTRAVENOUS
  Administered 2023-12-30 (×2): 160 ug via INTRAVENOUS
  Administered 2023-12-30: 80 ug via INTRAVENOUS
  Administered 2023-12-30: 160 ug via INTRAVENOUS
  Administered 2023-12-30: 80 ug via INTRAVENOUS

## 2023-12-30 SURGICAL SUPPLY — 46 items
BAG COUNTER SPONGE SURGICOUNT (BAG) ×2 IMPLANT
BAG DECANTER FOR FLEXI CONT (MISCELLANEOUS) ×2 IMPLANT
BIOPATCH RED 1 DISK 7.0 (GAUZE/BANDAGES/DRESSINGS) ×2 IMPLANT
CATH PALINDROME-P 28CM W/VT (CATHETERS) IMPLANT
CHLORAPREP W/TINT 26 (MISCELLANEOUS) ×2 IMPLANT
COVER PROBE W GEL 5X96 (DRAPES) ×2 IMPLANT
COVER SURGICAL LIGHT HANDLE (MISCELLANEOUS) ×2 IMPLANT
DERMABOND ADVANCED .7 DNX12 (GAUZE/BANDAGES/DRESSINGS) ×2 IMPLANT
DRAPE C-ARM 42X72 X-RAY (DRAPES) ×2 IMPLANT
DRSG IV TEGADERM 3.5X4.5 STRL (GAUZE/BANDAGES/DRESSINGS) ×2 IMPLANT
ELECTRODE REM PT RTRN 9FT ADLT (ELECTROSURGICAL) ×2 IMPLANT
GAUZE 4X4 16PLY ~~LOC~~+RFID DBL (SPONGE) ×2 IMPLANT
GLOVE BIO SURGEON STRL SZ8 (GLOVE) ×2 IMPLANT
GLOVE BIOGEL PI IND STRL 8 (GLOVE) ×2 IMPLANT
GOWN STRL REUS W/ TWL LRG LVL3 (GOWN DISPOSABLE) ×4 IMPLANT
GOWN STRL REUS W/ TWL XL LVL3 (GOWN DISPOSABLE) ×2 IMPLANT
GOWN STRL REUS W/TWL 2XL LVL3 (GOWN DISPOSABLE) ×4 IMPLANT
KIT BASIN OR (CUSTOM PROCEDURE TRAY) ×2 IMPLANT
KIT PALINDROME-P 55CM (CATHETERS) IMPLANT
KIT TURNOVER KIT B (KITS) ×2 IMPLANT
NDL 18GX1X1/2 (RX/OR ONLY) (NEEDLE) ×2 IMPLANT
NDL HYPO 25GX1X1/2 BEV (NEEDLE) ×2 IMPLANT
NEEDLE 18GX1X1/2 (RX/OR ONLY) (NEEDLE) ×2 IMPLANT
NEEDLE HYPO 25GX1X1/2 BEV (NEEDLE) ×2 IMPLANT
NS IRRIG 1000ML POUR BTL (IV SOLUTION) ×2 IMPLANT
PACK GENERAL/GYN (CUSTOM PROCEDURE TRAY) ×2 IMPLANT
PACK SRG BSC III STRL LF ECLPS (CUSTOM PROCEDURE TRAY) ×2 IMPLANT
PACK UNIVERSAL I (CUSTOM PROCEDURE TRAY) IMPLANT
PAD ARMBOARD POSITIONER FOAM (MISCELLANEOUS) ×4 IMPLANT
SET MICROPUNCTURE 5F STIFF (MISCELLANEOUS) IMPLANT
SOAP 2 % CHG 4 OZ (WOUND CARE) ×2 IMPLANT
SPIKE FLUID TRANSFER (MISCELLANEOUS) ×2 IMPLANT
SPONGE GAUZE 2X2 8PLY STRL LF (GAUZE/BANDAGES/DRESSINGS) ×2 IMPLANT
SUT ETHILON 3 0 PS 1 (SUTURE) ×2 IMPLANT
SUT MNCRL AB 4-0 PS2 18 (SUTURE) ×4 IMPLANT
SUT VIC AB 3-0 SH 27X BRD (SUTURE) ×2 IMPLANT
SUT VICRYL 0 UR6 27IN ABS (SUTURE) IMPLANT
SYR 10ML LL (SYRINGE) ×2 IMPLANT
SYR 20ML LL LF (SYRINGE) ×4 IMPLANT
SYR 5ML LL (SYRINGE) ×2 IMPLANT
SYR CONTROL 10ML LL (SYRINGE) ×2 IMPLANT
TOWEL GREEN STERILE (TOWEL DISPOSABLE) ×2 IMPLANT
TOWEL GREEN STERILE FF (TOWEL DISPOSABLE) ×2 IMPLANT
TUBE CONNECTING 12X1/4 (SUCTIONS) ×2 IMPLANT
WATER STERILE IRR 1000ML POUR (IV SOLUTION) ×2 IMPLANT
WIRE AMPLATZ SS-J .035X180CM (WIRE) IMPLANT

## 2023-12-30 NOTE — TOC Progression Note (Addendum)
 Transition of Care [facility]) - Progression Note    Patient Details  Name: Russell Austin MRN: [id number] Date of Birth: 1951-08-28  Transition of Care [facility]) CM/SW Contact  Juliane Och, LCSW Phone Number: 12/30/2023, 12:46 PM  Clinical Narrative:     12:46 PM Per chart review, patient receives peritoneal dialysis which is a barrier to SNF. Patient would need to transition from peritoneal dialysis to outpatient HD if discharging to SNF. CSW made medical team aware and Nephrology is to follow up on patient tolerance for outpatient HD.  1:08 PM Per nephrology team; patient's peritoneal dialysis catheter was recently removed. Patient will need outpatient HD upon discharge.  3:25 PM CSW provided current SNF options ([facility] [place], [place], [address], [place], [address], [address], [place], [address], [facility]) with quarterly Medicare ratings to patient and patient's spouse, Claretta. Patient and Claretta accepted bed offer at [facility]. CSW inquired about transportation and offered to coordinate ambulance transportation for discharge to SNF. CSW informed patient and Claretta of possible copay to be billed upon service. Patient and Claretta expressed understanding and continued interest in ambulance transportation for discharge. CSW relayed acceptance to SNF medical team. Medical team to follow regarding expected discharge for CSW to initiate insurance authorization for SNF and ambulance transportation. Renal Navigator is to obtain outpatient HD clinic for patient prior to discharge [facility] [place], [place], [place]).  Expected Discharge Plan: Skilled Nursing Facility Barriers to Discharge: Continued Medical Work up  Expected Discharge Plan and Services In-house Referral: Clinical Social Work     Living arrangements for the past 2 months: Single Family Home                                       Social  Determinants of Health (SDOH) Interventions SDOH Screenings   Food Insecurity: No Food Insecurity (12/27/2023)  Housing: Low Risk  (12/27/2023)  Transportation Needs: No Transportation Needs (12/27/2023)  Utilities: Not At Risk (12/27/2023)  Alcohol Screen: Low Risk  (01/21/2023)  Depression (PHQ2-9): Low Risk  (02/28/2023)  Recent Concern: Depression (PHQ2-9) - High Risk (01/08/2023)  Financial Resource Strain: Low Risk  (01/21/2023)  Physical Activity: Inactive (01/21/2023)  Social Connections: Socially Integrated (12/27/2023)  Stress: No Stress Concern Present (01/21/2023)  Tobacco Use: Low Risk  (12/30/2023)    Readmission Risk Interventions     No data to display

## 2023-12-30 NOTE — Anesthesia Procedure Notes (Signed)
 Procedure Name: Intubation Date/Time: 12/30/2023 8:00 AM  Performed by: Jamas Maywood, CRNAPre-anesthesia Checklist: Patient identified, Emergency Drugs available, Suction available and Patient being monitored Patient Re-evaluated:Patient Re-evaluated prior to induction Oxygen Delivery Method: Circle system utilized Preoxygenation: Pre-oxygenation with 100% oxygen Induction Type: IV induction Ventilation: Mask ventilation without difficulty and Oral airway inserted - appropriate to patient size Laryngoscope Size: Mac and 4 Grade View: Grade I Tube type: Oral Tube size: 7.5 mm Number of attempts: 1 Airway Equipment and Method: Stylet and Oral airway Placement Confirmation: ETT inserted through vocal cords under direct vision, positive ETCO2 and breath sounds checked- equal and bilateral Secured at: 22 cm Tube secured with: Tape Dental Injury: Teeth and Oropharynx as per pre-operative assessment

## 2023-12-30 NOTE — Progress Notes (Addendum)
 Subjective: Seen in room with wife present, status post earlier in a.m. removal of PD catheter and placement of left-sided IJ TDC for HD.  Currently sleepy no complaints.  As did PD on Saturday.  Noted plans for rehab/SNF placement.  HD in a.m. for  1st shift  Objective Vital signs in last 24 hours: Vitals:   12/30/23 0945 12/30/23 1000 12/30/23 1015 12/30/23 1031  BP: 139/67 120/64 125/69 137/68  Pulse: 60 64 66 70  Resp: 11 12 12 16   Temp: (!) 94.2 F (34.6 C)  (!) 97.4 F (36.3 C) (!) 97.5 F (36.4 C)  TempSrc:    Oral  SpO2: 99% 97% 100% 100%  Weight:      Height:       Weight change: -4.7 kg  Physical Exam: General: Sleepy elderly chronically ill male NAD Heart: RRR no MRG Lungs: CTA bilaterally nonlabored breathing Abdomen: Slightly decreased bowel sounds, soft , surgical bandage clean dry at site of prior PD, nondistended Extremities: Trace pedal edema Dialysis Access: Left IJ TDC dressing dry clear  Outpatient dialysis= prior PD patient of Dr. Kennith Peak  Problem/Plan: Recurrent peritonitis status post PD catheter removed today changing to hemodialysis.  On IV antibiotics ESRD -prior PD last exchange Saturday 4 /26, K3.5 this a.m., start hemodialysis for shift in a.m. 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 needs ESA started hold iron for now with recent peritonitis will follow-up outpatient iron studies Secondary hyperparathyroidism -corrected calcium  okay 8 phosphorus HTN/volume -BP currently stable on amlodipine  10 mg daily, metoprolol  50 mg twice daily, losartan  100 mg daily, torsemide (he states he does not make any urine with this will DC ) should be able to taper down BP meds anticipating HD BP drop with UF but minimal pedal edema on exam  Charlynne Coombes, PA-C [facility] Beeper [phone] 12/30/2023,3:40 PM  LOS: 3 days   Labs: Basic Metabolic Panel: Recent Labs   Lab 12/27/23 0543 12/28/23 0229 12/29/23 0502 12/30/23 0438  NA 137 143 139 142  K 3.4* 3.0* 3.3* 3.5  CL 94* 97* 100 101  CO2 28 34* 30 29  GLUCOSE 329* 194* 118* 56*  BUN 25* 26* 22 29*  CREATININE 2.62* 2.44* 2.13* 2.70*  CALCIUM  8.0* 7.7* 7.4* 7.7*  PHOS 2.6 2.1*  --   --    Liver Function Tests: Recent Labs  Lab 12/27/23 0543 12/28/23 0229 12/30/23 0438  AST 45*  --  22  ALT 38  --  19  ALKPHOS 108  --  89  BILITOT 0.6  --  0.9  PROT 7.2  --  6.3*  ALBUMIN  1.9* 1.8* 1.8*   Recent Labs  Lab 12/27/23 0543  LIPASE 22   No results for input(s): "AMMONIA" in the last 168 hours. CBC: Recent Labs  Lab 12/27/23 0543 12/28/23 0229 12/29/23 0502 12/30/23 0438  WBC 6.7 9.8 10.6* 7.3  NEUTROABS  --   --  8.2* 5.2  HGB 8.1* 7.4* 7.6* 7.2*  HCT 26.4* 23.8* 24.5* 23.6*  MCV 83.8 82.6 83.1 83.1  PLT 426* 426* 373 333   Cardiac Enzymes: No results for input(s): "CKTOTAL", "CKMB", "CKMBINDEX", "TROPONINI" in the last 168 hours. CBG: Recent Labs  Lab 12/30/23 0538 12/30/23 0602 12/30/23 0920 12/30/23 0936 12/30/23 1145  GLUCAP 52* 107* 59* 90 123*    Studies/Results: DG C-Arm 1-60 Min Result Date: 12/30/2023 CLINICAL DATA:  Dialysis catheter exchange EXAM: DG C-ARM  1-60 MIN FLUOROSCOPY: Fluoroscopy Time:  29 seconds Radiation Exposure Index (if provided by the fluoroscopic device): 4.57 mGy Number of Acquired Spot Images: 0 COMPARISON:  None Available. FINDINGS: Two intraoperative spot images demonstrate left dialysis catheter. The tip is in the right atrium. IMPRESSION: As above. Electronically Signed   By: Janeece Mechanic M.D.   On: 12/30/2023 13:05   VAS US  UPPER EXT VEIN MAPPING (PRE-OP AVF) Result Date: 12/29/2023 UPPER EXTREMITY VEIN MAPPING Patient Name:  Russell Austin  Date of Exam:   12/29/2023 Medical Rec #: [id number]            Accession #:    [id number] Date of Birth: 1951-01-25           Patient Gender: M Patient Age:   73 years Exam Location:   [facility] Procedure:      VAS US  UPPER EXT VEIN MAPPING (PRE-OP AVF) Referring Phys: Hilario Lover PEEPLES --------------------------------------------------------------------------------  Indications: Pre-access. History: ESRD, failed peritoneal dialysis secondary to infection. CVA 2016, now          with residual right hemiparesis.  Limitations: Right arm hemiparesis, IV, bandages, tissue properties, edema Comparison Study: No prior mapping on file Performing Technologist: Carleene Chase RVS  Examination Guidelines: A complete evaluation includes B-mode imaging, spectral Doppler, color Doppler, and power Doppler as needed of all accessible portions of each vessel. Bilateral testing is considered an integral part of a complete examination. Limited examinations for reoccurring indications may be performed as noted. +-----------------+-------------+----------+--------------+ Right Cephalic   Diameter (cm)Depth (cm)   Findings    +-----------------+-------------+----------+--------------+ Prox upper arm       0.16        0.40                  +-----------------+-------------+----------+--------------+ Mid upper arm        0.12        0.38                  +-----------------+-------------+----------+--------------+ Dist upper arm       0.11        0.42                  +-----------------+-------------+----------+--------------+ Antecubital fossa                       not visualized +-----------------+-------------+----------+--------------+ Prox forearm                            not visualized +-----------------+-------------+----------+--------------+ Mid forearm                             not visualized +-----------------+-------------+----------+--------------+ Dist forearm                            not visualized +-----------------+-------------+----------+--------------+ Wrist                                   not visualized  +-----------------+-------------+----------+--------------+ +-----------------+-------------+----------+--------------+ Right Basilic    Diameter (cm)Depth (cm)   Findings    +-----------------+-------------+----------+--------------+ Prox upper arm                          not visualized +-----------------+-------------+----------+--------------+ Mid upper arm  not visualized +-----------------+-------------+----------+--------------+ Dist upper arm                          not visualized +-----------------+-------------+----------+--------------+ Antecubital fossa    0.25        0.85                  +-----------------+-------------+----------+--------------+ Prox forearm         0.18        1.10                  +-----------------+-------------+----------+--------------+ Mid forearm                             not visualized +-----------------+-------------+----------+--------------+ Distal forearm                          not visualized +-----------------+-------------+----------+--------------+ Wrist                                   not visualized +-----------------+-------------+----------+--------------+ +-----------------+-------------+----------+---------------------+ Left Cephalic    Diameter (cm)Depth (cm)      Findings        +-----------------+-------------+----------+---------------------+ Prox upper arm                             not visualized     +-----------------+-------------+----------+---------------------+ Mid upper arm                              not visualized     +-----------------+-------------+----------+---------------------+ Dist upper arm                             not visualized     +-----------------+-------------+----------+---------------------+ Antecubital fossa    0.32        0.17                         +-----------------+-------------+----------+---------------------+ Prox  forearm         0.18        0.29                         +-----------------+-------------+----------+---------------------+ Mid forearm          0.20        0.25                         +-----------------+-------------+----------+---------------------+ Dist forearm         0.26        0.22                         +-----------------+-------------+----------+---------------------+ Wrist                                   not visualized and IV +-----------------+-------------+----------+---------------------+ +-----------------+-------------+----------+---------------------+ Left Basilic     Diameter (cm)Depth (cm)      Findings        +-----------------+-------------+----------+---------------------+ Prox upper arm  not visualized     +-----------------+-------------+----------+---------------------+ Mid upper arm      0.26/0.10  1.25/0.79                       +-----------------+-------------+----------+---------------------+ Dist upper arm       0.33        0.81         branching       +-----------------+-------------+----------+---------------------+ Antecubital fossa    0.29        0.64                         +-----------------+-------------+----------+---------------------+ Prox forearm         0.24        0.85                         +-----------------+-------------+----------+---------------------+ Mid forearm          0.24        1.39                         +-----------------+-------------+----------+---------------------+ Distal forearm                          not visualized and IV +-----------------+-------------+----------+---------------------+ Wrist                                   not visualized and IV +-----------------+-------------+----------+---------------------+ *See table(s) above for measurements and observations.  Diagnosing physician: Jimmye Moulds MD Electronically signed by Jimmye Moulds MD  on 12/29/2023 at 6:16:56 PM.    Final    Medications:  ceFEPime  (MAXIPIME ) IV 1 g (12/29/23 2222)   [START ON 12/31/2023] vancomycin  (VANCOCIN ) 750 mg in sodium chloride  0.9 % 250 mL IVPB      amLODipine   10 mg Oral Daily   atorvastatin   10 mg Oral q morning   buPROPion   300 mg Oral q morning   calcitRIOL   0.5 mcg Oral Daily   Chlorhexidine  Gluconate Cloth  6 each Topical Q0600   dextrose        gabapentin   300 mg Oral QHS   gentamicin  cream  1 Application Topical Daily   [START ON 12/31/2023] heparin  injection (subcutaneous)  5,000 Units Subcutaneous Q8H   insulin  aspart  0-15 Units Subcutaneous TID WC   insulin  aspart  4 Units Subcutaneous TID WC   insulin  glargine-yfgn  18 Units Subcutaneous QHS   losartan   100 mg Oral Daily   metoprolol  tartrate  50 mg Oral BID   pantoprazole   40 mg Oral Daily   potassium chloride   40 mEq Oral Daily   sodium chloride  flush  10-40 mL Intracatheter Q12H   sodium chloride  flush  3 mL Intravenous Q12H   torsemide  60 mg Oral Daily

## 2023-12-30 NOTE — Transfer of Care (Signed)
 Immediate Anesthesia Transfer of Care Note  Patient: Russell Austin  Procedure(s) Performed: CONTINUOUS AMBULATORY PERITONEAL DIALYSIS  (CAPD) CATHETER REMOVAL (Abdomen) EXCHANGE OF DIALYSIS CATHETER (Chest)  Patient Location: PACU  Anesthesia Type:General  Level of Consciousness: drowsy and patient cooperative  Airway & Oxygen Therapy: Patient Spontanous Breathing  Post-op Assessment: Report given to RN and Post -op Vital signs reviewed and stable  Post vital signs: Reviewed and stable  Last Vitals:  Vitals Value Taken Time  BP 130/69 12/30/23 0915  Temp    Pulse 59 12/30/23 0917  Resp 22 12/30/23 0917  SpO2 100 % 12/30/23 0917  Vitals shown include unfiled device data.  Last Pain:  Vitals:   12/30/23 0734  TempSrc: Axillary  PainSc:       Patients Stated Pain Goal: 0 (12/30/23 0724)  Complications: There were no known notable events for this encounter.

## 2023-12-30 NOTE — Plan of Care (Signed)
  Problem: Education: Goal: Knowledge of General Education information will improve Description: Including pain rating scale, medication(s)/side effects and non-pharmacologic comfort measures Outcome: Progressing   Problem: Clinical Measurements: Goal: Ability to maintain clinical measurements within normal limits will improve Outcome: Progressing   Problem: Activity: Goal: Risk for activity intolerance will decrease Outcome: Progressing   Problem: Nutrition: Goal: Adequate nutrition will be maintained Outcome: Progressing   Problem: Pain Managment: Goal: General experience of comfort will improve and/or be controlled Outcome: Progressing

## 2023-12-30 NOTE — Progress Notes (Signed)
 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 needing core track and CIR evaluation at the time HLD ESRD previously on PD since 05/2023 dry weight 75 Performed chronic disease secondary hyperparathyroidism--previous complications with PD having Enterococcus faecalis peritonitis treated with vancomycin  Developed symptoms of peritonitis 4/15 patient was started on cefepime  vancomycin  switched to ceftazidime-developed diarrhea nausea multiple vomiting episodes Directed to come to ED 4/25 by Nephrology on-call service--- PD fluid check on 4/15 showed many gram-negative coccobacilli rare gram-positive cocci in pairs and clusters with resistance to ampicillin cefazolin  Sodium 137 potassium 3.4 BUN/creatinine 25/2.6 calcium  8 phosphorus 2.6 LFTs AST 45 ALT 38 Lactic acid cycling 1.9-2.1 WBC 6.7 hemoglobin 8.1 platelet 426--BC obtained, peritoneal fluid examined showing 95 nucleated cells 91% neutrophils cloudy appearance CT ABD pelvis peritoneal dialysis with loculated peritoneal fluid collections in ventral abdomen tracking along right liver?  Subcapsular involvement given degree of right liver scalloping-small volume pleural fluid with atelectasis right more than left base His primary nephrologist who is working consulted and saw the patient Vascular surgeon evaluated patient for permanent access placement eventually Patient had a temporary trialysis catheter placed but complication of bleeding from left jugular catheter site--desmopressin  was given X2 4/28 removal PD cath, placement right-sided temporary dialysis access   Plan  Sepsis 2/2 polymicrobial dialysate infection--localized not bacteremic--removed  PD cath Gram-negative coccobacilli/gram-positive cocci pair/cluster on 4/15--growing Enterococcus 4/25--on IV cefepime /vancomycin  renally dosed-await susceptibility probably can narrow to penicillin for total of 10 days-await finalization culture BC x2 negative from 4/25 Sepsis physiology resolved currently  Bleeding from left trialysis jugular site--temp cath placed for iHD Desmopressin  X2-appreciate follow-up by IR-hemoglobin has dropped from baseline of 9-7 range transfusion threshold 7 Holding aspirin  81 at this time for now  Hypokalemia/ESRD now on HD instead of PD-hyper parathyroid, anemia renal disease Trialysis access catheter placed 4/25-dialysis through this not taking calcitriol  0.25 daily-reordered  HTN holding losartan  100 holding chlorthalidone torsemide BP controlled on daily continue metoprolol  50 twice daily increased amlodipine  10  Mildly elevated LFT/transaminitis, mild thrombocytosis Both secondary to likely infection trend to normal with labs as able  DM ty 2--At home 8 units 3 times daily continue Lantus  18 Hypoglycemia likely secondary to n.p.o. status-placed on 3 times daily 4 units short acting  holding Glucotrol  XL 5  Previous stroke 2016, HLD Dense R side deficit--using wc for the past 3 mo and debilitated--therapy to see--might require skilled placeemnt  DVT prophylaxis: heparin   Status is: Inpatient Remains inpatient appropriate because:   Requires further care--updated wife at the bedside on 4/28   Subjective:  Looks well feels fair just back from surgery no chest pain has not eaten yet No fever no chills clear deficits on right side remain  Objective + exam Vitals:   12/30/23 0945 12/30/23 1000 12/30/23 1015 12/30/23 1031  BP: 139/67 120/64 125/69 137/68  Pulse: 60 64 66 70  Resp: 11 12 12 16   Temp: (!) 94.2 F (34.6 C)  (!) 97.4 F (36.3 C) (!) 97.5 F (36.4 C)  TempSrc:    Oral  SpO2: 99% 97% 100% 100%  Weight:      Height:        Filed Weights   12/28/23 2028 12/28/23  2357 12/30/23 0500  Weight: 70.4 kg 66.2 kg 65.7 kg    Examination:  Awake coherent in nad Cta b no wheeze--no distress--tunneled jugular access on left side present S1-S2 no murmur Abd soft nt--PD area covered in sterile bandage postop changes noted Densely weaker on R side, some contractures R hand and weaker R arm> L arm  Data Reviewed: reviewed   CBC    Component Value Date/Time   WBC 7.3 12/30/2023 0438   RBC 2.84 (L) 12/30/2023 0438   HGB 7.2 (L) 12/30/2023 0438   HCT 23.6 (L) 12/30/2023 0438   PLT 333 12/30/2023 0438   MCV 83.1 12/30/2023 0438   MCH 25.4 (L) 12/30/2023 0438   MCHC 30.5 12/30/2023 0438   RDW 18.4 (H) 12/30/2023 0438   LYMPHSABS 1.0 12/30/2023 0438   MONOABS 0.8 12/30/2023 0438   EOSABS 0.3 12/30/2023 0438   BASOSABS 0.0 12/30/2023 0438      Latest Ref Rng & Units 12/30/2023    4:38 AM 12/29/2023    5:02 AM 12/28/2023    2:29 AM  CMP  Glucose 70 - 99 mg/dL 56  454  098   BUN 8 - 23 mg/dL 29  22  26    Creatinine 0.61 - 1.24 mg/dL 1.19  1.47  8.29   Sodium 135 - 145 mmol/L 142  139  143   Potassium 3.5 - 5.1 mmol/L 3.5  3.3  3.0   Chloride 98 - 111 mmol/L 101  100  97   CO2 22 - 32 mmol/L 29  30  34   Calcium  8.9 - 10.3 mg/dL 7.7  7.4  7.7   Total Protein 6.5 - 8.1 g/dL 6.3     Total Bilirubin 0.0 - 1.2 mg/dL 0.9     Alkaline Phos 38 - 126 U/L 89     AST 15 - 41 U/L 22     ALT 0 - 44 U/L 19       Scheduled Meds:  amLODipine   10 mg Oral Daily   atorvastatin   10 mg Oral q morning   buPROPion   300 mg Oral q morning   calcitRIOL   0.5 mcg Oral Daily   Chlorhexidine  Gluconate Cloth  6 each Topical Q0600   dextrose        gabapentin   300 mg Oral QHS   gentamicin  cream  1 Application Topical Daily   [START ON 12/31/2023] heparin  injection (subcutaneous)  5,000 Units Subcutaneous Q8H   insulin  aspart  0-15 Units Subcutaneous TID WC   insulin  aspart  8 Units Subcutaneous Once   insulin  glargine-yfgn  18  Units Subcutaneous QHS   losartan   100 mg Oral Daily   metoprolol  tartrate  50 mg Oral BID   pantoprazole   40 mg Oral Daily   potassium chloride   40 mEq Oral Daily   sodium chloride  flush  10-40 mL Intracatheter Q12H   sodium chloride  flush  3 mL Intravenous Q12H   torsemide  60 mg Oral Daily   Continuous Infusions:  ceFEPime  (MAXIPIME ) IV 1 g (12/29/23 2222)   [START ON 12/31/2023] vancomycin  (VANCOCIN ) 750 mg in sodium chloride  0.9 % 250 mL IVPB      Time  20  Verlie Glisson, MD  [facility]

## 2023-12-30 NOTE — Inpatient Diabetes Management (Addendum)
 Inpatient Diabetes Program Recommendations  AACE/ADA: New Consensus Statement on Inpatient Glycemic Control   Target Ranges:  Prepandial:   less than 140 mg/dL      Peak postprandial:   less than 180 mg/dL (1-2 hours)      Critically ill patients:  140 - 180 mg/dL    Latest Reference Range & Units 12/29/23 07:47 12/29/23 12:03 12/29/23 18:01 12/29/23 20:16 12/30/23 05:38 12/30/23 06:02  Glucose-Capillary 70 - 99 mg/dL 161 (H) 096 (H) 98 045 (H) 52 (L) 107 (H)   Review of Glycemic Control  Diabetes history: DM2 Outpatient Diabetes medications: Glipizide  XL 5 mg BID, Novolog  8 units daily, Lantus  18 units QPM, FreeStyle Libre 3 Current orders for Inpatient glycemic control: Semglee  18 units at bedtime, Novolog  0-15 units TID with meals  Inpatient Diabetes Program Recommendations:    Insulin : CBG 52 mg/dl today at 4:09 am and 59 mg/dl at 8:11 am.  Please consider decreasing Semglee  to 13 units at bedtime and Novolog  correction to 0-9 units TID with meals.  Thanks, Beacher Limerick, RN, MSN, CDCES Diabetes Coordinator Inpatient Diabetes Program [phone] (Team Pager from 8am to 5pm)

## 2023-12-30 NOTE — Progress Notes (Signed)
 Requested to see pt for out-pt HD needs at d/c. Pt was receiving PD care through [facility] prior to admission. Pt for new snf placement as well. Referral submitted to [facility] admissions today and requested clinics that snf will transport pt to for out-pt HD at d/c. Will assist as needed.   Lauraine Polite Renal Navigator [phone]

## 2023-12-30 NOTE — Anesthesia Postprocedure Evaluation (Signed)
 Anesthesia Post Note  Patient: Russell Austin  Procedure(s) Performed: CONTINUOUS AMBULATORY PERITONEAL DIALYSIS  (CAPD) CATHETER REMOVAL (Abdomen) EXCHANGE OF DIALYSIS CATHETER (Chest)     Patient location during evaluation: PACU Anesthesia Type: General Level of consciousness: awake and alert Pain management: pain level controlled Vital Signs Assessment: post-procedure vital signs reviewed and stable Respiratory status: spontaneous breathing, nonlabored ventilation, respiratory function stable and patient connected to nasal cannula oxygen Cardiovascular status: blood pressure returned to baseline and stable Postop Assessment: no apparent nausea or vomiting Anesthetic complications: no   There were no known notable events for this encounter.  Last Vitals:  Vitals:   12/30/23 1015 12/30/23 1031  BP: 125/69 137/68  Pulse: 66 70  Resp: 12 16  Temp: (!) 36.3 C (!) 36.4 C  SpO2: 100% 100%    Last Pain:  Vitals:   12/30/23 1031  TempSrc: Oral  PainSc:                  Juventino Oppenheim

## 2023-12-30 NOTE — Plan of Care (Signed)
  Problem: Metabolic: Goal: Ability to maintain appropriate glucose levels will improve Outcome: Progressing   Problem: Clinical Measurements: Goal: Respiratory complications will improve Outcome: Progressing   Problem: Nutrition: Goal: Adequate nutrition will be maintained Outcome: Progressing   Problem: Elimination: Goal: Will not experience complications related to bowel motility Outcome: Progressing Goal: Will not experience complications related to urinary retention Outcome: Progressing

## 2023-12-30 NOTE — Progress Notes (Signed)
 [facility]  Subjective  - no complaints.     Objective (!) 149/88 70 (!) 97.5 F (36.4 C) (Oral) 16 100%  Intake/Output Summary (Last 24 hours) at 12/30/2023 0732 Last data filed at 12/29/2023 2018 Gross per 24 hour  Intake 120 ml  Output 1450 ml  Net -1330 ml    PD catheter in place Nonlabored breathing  Laboratory Lab Results: Recent Labs    12/29/23 0502 12/30/23 0438  WBC 10.6* 7.3  HGB 7.6* 7.2*  HCT 24.5* 23.6*  PLT 373 333   BMET Recent Labs    12/29/23 0502 12/30/23 0438  NA 139 142  K 3.3* 3.5  CL 100 101  CO2 30 29  GLUCOSE 118* 56*  BUN 22 29*  CREATININE 2.13* 2.70*  CALCIUM  7.4* 7.7*    COAG Lab Results  Component Value Date   INR 1.06 02/10/2015   INR 1.05 02/09/2015   No results found for: "PTT"  Assessment/Planning:  Plan for removal of PD catheter and placement of tunneled dialysis catheter today in the OR given concern for peritonitis with his PD catheter - PD cultures positive.    After discussing the risks and benefits, Russell Austin elected to proceed.     Russell Austin E Russell Austin 12/30/2023 7:32 AM --

## 2023-12-30 NOTE — Op Note (Signed)
    NAME: Russell Austin    MRN: [id number] DOB: [birth year]    DATE OF OPERATION: 12/30/2023  PREOP DIAGNOSIS:    Infected peritoneal dialysis catheter  POSTOP DIAGNOSIS:    Same  PROCEDURE:    Peritoneal dialysis catheter removal  28 cm left-sided internal jugular tunneled dialysis catheter placement  SURGEON: Kayla Part  ASSIST: Aubry Blase, PA  ANESTHESIA: General  EBL: 10ml  INDICATIONS:    Russell Austin is a 73 y.o. male with end-stage renal disease with history of peritoneal dialysis.  He presented with infection of his peritoneal dialysis catheter.  A left-sided temporary dialysis catheter was placed.  He was placed on antibiotics.  Blood cultures negative.  After discussing risk and benefits of tunneled dialysis catheter placement, peritoneal dialysis catheter removal, I have elected to proceed.  FINDINGS:   Placement removal of peritoneal dialysis catheter. 28 cm tunneled palindrome placement at the cavoatrial junction.  TECHNIQUE:   Patient is brought to the OR laid in supine position.  General anesthesia was induced and patient was prepped and draped in standard fashion.  The case began with removal of the left-sided temporary dialysis catheter over a wire using Seldinger technique under fluoroscopy.  Next, the 20 cm tunneled palindrome catheter kit was opened, and the sheath placed in the left internal jugular vein.  A counterincision was made on the left chest, and the catheter tunneled from the left chest to the left neck.  The catheter was then placed on the wire using Seldinger technique and delivered to the cavoatrial junction.  Follow-up fluoroscopy demonstrated an excellent result.  The wire was removed and completion imaging taken.  Next, I moved to the abdomen.  An ultrasound was used to insonate the entirety of the peritoneal dialysis catheter.  The bumpers were marked, and 3 transverse incisions were made on the abdomen.  The incisions  were carried down to the catheter, specifically at the felt bumpers, and the felt bumpers removed from overlying tissue using cautery.  The catheter was then removed.  To the abdominal wall defect where the catheter entered the abdomen, was repaired using a 0 Vicryl on a UR 6 needle..  Next, the wound beds were irrigated with copious amounts of saline.  They were closed in layers using Vicryl with Monocryl and dermabond at the level of the skin.  Kayla Part, MD [facility] DATE OF DICTATION:   12/30/2023

## 2023-12-30 NOTE — NC FL2 (Signed)
 [place]  MEDICAID FL2 LEVEL OF CARE FORM     IDENTIFICATION  Patient Name: Russell Austin Birthdate: Aug 25, 1951 Sex: male Admission Date (Current Location): 12/27/2023  [facility] and [place] Number:  Producer, television/film/video and Address:  The [facility], [address], [place], [place] [address]      Provider Number: [id number]  Attending Physician Name and Address:  Verlie Glisson, MD  Relative Name and Phone Number:  Hallet, Obenour   [phone]    Current Level of Care: Hospital Recommended Level of Care: Skilled Nursing Facility Prior Approval Number:    Date Approved/Denied:   PASRR Number: [id number]  Discharge Plan: SNF    Current Diagnoses: Patient Active Problem List   Diagnosis Date Noted   Peritonitis (HCC) 12/27/2023   ESRD on dialysis (HCC) 12/27/2023   Hypertensive urgency 12/27/2023   Normocytic anemia 12/27/2023   Legally blind 12/27/2023   Controlled diabetes mellitus with arthropathy, with long-term current use of insulin  (HCC) 07/14/2020   Hyperlipidemia 07/14/2020   Osteoarthritis 07/14/2020   Plantar fasciitis 07/14/2020   Stasis edema of both lower extremities 01/01/2019   Primary osteoarthritis of left shoulder 03/20/2018   Spondylosis of 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 myelopathy or radiculopathy 03/20/2018   Complex regional pain syndrome I of upper limb 06/27/2015   Dysphagia following cerebral infarction 02/15/2015   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)    CKD stage 2 due to type 2 diabetes mellitus (HCC) 02/12/2015   Right hemiparesis (HCC)    CKD (chronic kidney disease) stage 5, GFR less than 15 ml/min (HCC) 02/10/2015   Type 2 diabetes mellitus with diabetic neuropathy (HCC) 02/10/2015   Neck pain    Numbness and tingling 02/09/2015   Left shoulder pain 07/17/2012   Plantar fasciitis, bilateral 07/17/2012   Peripheral neuropathy 11/06/2007   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    Essential hypertension 07/08/2007   Extrinsic asthma 06/25/2007    Orientation RESPIRATION BLADDER Height & Weight     Self, Place, Situation  Normal Continent Weight: 144 lb 13.5 oz (65.7 kg) Height:  5\' 8"  (172.7 cm)  BEHAVIORAL SYMPTOMS/MOOD NEUROLOGICAL BOWEL NUTRITION STATUS      Continent Diet (See dc summary)  AMBULATORY STATUS COMMUNICATION OF NEEDS Skin   Extensive Assist Verbally PU Stage and Appropriate Care (Incision - 4 Ports Abdomen 1: Anterior;Mid;Upper 2: Anterior;Mid;Medial 3: Left;Upper;Anterior 4: Left;Mid;Anterior)                       Personal Care Assistance Level of Assistance  Bathing, Feeding, Dressing, Total care Bathing Assistance: Maximum assistance Feeding assistance: Limited assistance Dressing Assistance: Maximum assistance Total Care Assistance: Maximum assistance   Functional Limitations Info  Sight, Hearing, Speech Sight Info: Impaired Hearing Info: Adequate Speech Info: Impaired    SPECIAL CARE FACTORS FREQUENCY  PT (By licensed PT), OT (By licensed OT)     PT Frequency: 5x weekly OT Frequency: 5x weekly            Contractures      Additional Factors Info  Code Status, Allergies Code Status Info: Full Allergies Info: Lactose Intolerance (gi);Apple Pectin (pectin);Metformin  And Related; Peach (prunus Persica);Morphine  And Codeine           Current Medications (12/30/2023):  This is the current hospital active medication list Current Facility-Administered Medications  Medication Dose Route Frequency Provider Last Rate Last Admin   acetaminophen  (TYLENOL ) tablet 650 mg  650 mg Oral Q6H PRN Mabelene Savannah,  McKenzi P, PA-C       Or   acetaminophen  (TYLENOL ) suppository 650 mg  650 mg Rectal Q6H PRN Schuh, McKenzi P, PA-C       albuterol (PROVENTIL) (2.5 MG/3ML) 0.083% nebulizer solution 2.5 mg  2.5 mg Nebulization Q6H PRN Schuh, McKenzi P, PA-C       amLODipine  (NORVASC ) tablet 10 mg  10 mg Oral Daily Schuh, McKenzi P, PA-C        atorvastatin  (LIPITOR) tablet 10 mg  10 mg Oral q morning Schuh, McKenzi P, PA-C   10 mg at 12/29/23 1027   buPROPion  (WELLBUTRIN  XL) 24 hr tablet 300 mg  300 mg Oral q morning Schuh, McKenzi P, PA-C   300 mg at 12/29/23 1025   calcitRIOL  (ROCALTROL ) capsule 0.5 mcg  0.5 mcg Oral Daily Schuh, McKenzi P, PA-C   0.5 mcg at 12/29/23 1025   ceFEPIme  (MAXIPIME ) 1 g in sodium chloride  0.9 % 100 mL IVPB  1 g Intravenous Q2200 Schuh, McKenzi P, PA-C 200 mL/hr at 12/29/23 2222 1 g at 12/29/23 2222   Chlorhexidine  Gluconate Cloth 2 % PADS 6 each  6 each Topical Q0600 Schuh, McKenzi P, PA-C   6 each at 12/30/23 0548   chlorproMAZINE  (THORAZINE ) tablet 25 mg  25 mg Oral Daily PRN Schuh, McKenzi P, PA-C       dextrose  50 % solution            fentaNYL  (SUBLIMAZE ) injection 25 mcg  25 mcg Intravenous Q2H PRN Schuh, McKenzi P, PA-C       gabapentin  (NEURONTIN ) capsule 300 mg  300 mg Oral QHS Schuh, McKenzi P, PA-C   300 mg at 12/29/23 2119   gentamicin  cream (GARAMYCIN ) 0.1 % 1 Application  1 Application Topical Daily Schuh, McKenzi P, PA-C       [START ON 12/31/2023] heparin  injection 5,000 Units  5,000 Units Subcutaneous Q8H Schuh, McKenzi P, PA-C       hydrALAZINE (APRESOLINE) injection 10 mg  10 mg Intravenous Q4H PRN Schuh, McKenzi P, PA-C       insulin  aspart (novoLOG ) injection 0-15 Units  0-15 Units Subcutaneous TID WC Schuh, McKenzi P, PA-C   3 Units at 12/28/23 1810   insulin  aspart (novoLOG ) injection 4 Units  4 Units Subcutaneous TID WC Samtani, Jai-Gurmukh, MD       insulin  glargine-yfgn (SEMGLEE ) injection 18 Units  18 Units Subcutaneous QHS Schuh, McKenzi P, PA-C   18 Units at 12/29/23 2123   losartan  (COZAAR ) tablet 100 mg  100 mg Oral Daily Schuh, McKenzi P, PA-C   100 mg at 12/29/23 1024   metoprolol  tartrate (LOPRESSOR ) tablet 50 mg  50 mg Oral BID Schuh, McKenzi P, PA-C   50 mg at 12/29/23 2119   ondansetron  (ZOFRAN ) tablet 4 mg  4 mg Oral Q6H PRN Schuh, McKenzi P, PA-C       Or   ondansetron   (ZOFRAN ) injection 4 mg  4 mg Intravenous Q6H PRN Schuh, McKenzi P, PA-C       pantoprazole  (PROTONIX ) EC tablet 40 mg  40 mg Oral Daily Schuh, McKenzi P, PA-C   40 mg at 12/29/23 1026   potassium chloride  SA (KLOR-CON  M) CR tablet 40 mEq  40 mEq Oral Daily Schuh, McKenzi P, PA-C   40 mEq at 12/29/23 1026   sodium chloride  flush (NS) 0.9 % injection 10-40 mL  10-40 mL Intracatheter Q12H Schuh, McKenzi P, PA-C   10 mL at 12/29/23 2123   sodium chloride   flush (NS) 0.9 % injection 10-40 mL  10-40 mL Intracatheter PRN Schuh, McKenzi P, PA-C       sodium chloride  flush (NS) 0.9 % injection 3 mL  3 mL Intravenous Q12H Schuh, McKenzi P, PA-C   3 mL at 12/29/23 2123   torsemide (DEMADEX) tablet 60 mg  60 mg Oral Daily Schuh, McKenzi P, PA-C   60 mg at 12/29/23 1026   [START ON 12/31/2023] vancomycin  (VANCOCIN ) 750 mg in sodium chloride  0.9 % 250 mL IVPB  750 mg Intravenous Q T,Th,Sa-HD Schuh, McKenzi P, PA-C         Discharge Medications: Please see discharge summary for a list of discharge medications.  Relevant Imaging Results:  Relevant Lab Results:   Additional Information SSN#: [id number]; New outpatient HD patient  Juliane Och, LCSW

## 2023-12-30 NOTE — Progress Notes (Signed)
 Patient CBG was 52. Followed hypoglycemia protocol and rechecked CBG was 107mg /dl.

## 2023-12-31 ENCOUNTER — Encounter (HOSPITAL_COMMUNITY): Payer: Self-pay | Admitting: Vascular Surgery

## 2023-12-31 DIAGNOSIS — K659 Peritonitis, unspecified: Secondary | ICD-10-CM | POA: Diagnosis not present

## 2023-12-31 DIAGNOSIS — B952 Enterococcus as the cause of diseases classified elsewhere: Secondary | ICD-10-CM | POA: Diagnosis not present

## 2023-12-31 DIAGNOSIS — B955 Unspecified streptococcus as the cause of diseases classified elsewhere: Secondary | ICD-10-CM

## 2023-12-31 DIAGNOSIS — B961 Klebsiella pneumoniae [K. pneumoniae] as the cause of diseases classified elsewhere: Secondary | ICD-10-CM

## 2023-12-31 DIAGNOSIS — B9689 Other specified bacterial agents as the cause of diseases classified elsewhere: Secondary | ICD-10-CM

## 2023-12-31 DIAGNOSIS — B964 Proteus (mirabilis) (morganii) as the cause of diseases classified elsewhere: Secondary | ICD-10-CM

## 2023-12-31 LAB — CULTURE, BODY FLUID W GRAM STAIN -BOTTLE

## 2023-12-31 LAB — GLUCOSE, CAPILLARY
Glucose-Capillary: 216 mg/dL — ABNORMAL HIGH (ref 70–99)
Glucose-Capillary: 155 mg/dL — ABNORMAL HIGH (ref 70–99)

## 2023-12-31 LAB — BASIC METABOLIC PANEL WITH GFR
Anion gap: 10 (ref 5–15)
BUN: 42 mg/dL — ABNORMAL HIGH (ref 8–23)
CO2: 28 mmol/L (ref 22–32)
Calcium: 6.8 mg/dL — ABNORMAL LOW (ref 8.9–10.3)
Chloride: 97 mmol/L — ABNORMAL LOW (ref 98–111)
Creatinine, Ser: 3.62 mg/dL — ABNORMAL HIGH (ref 0.61–1.24)
GFR, Estimated: 17 mL/min — ABNORMAL LOW (ref >60)
Glucose, Bld: 242 mg/dL — ABNORMAL HIGH (ref 70–99)
Potassium: 3.4 mmol/L — ABNORMAL LOW (ref 3.5–5.1)
Sodium: 135 mmol/L (ref 135–145)

## 2023-12-31 LAB — CBC WITH DIFFERENTIAL/PLATELET
Abs Immature Granulocytes: 0.03 10*3/uL (ref 0.00–0.07)
Basophils Absolute: 0 10*3/uL (ref 0.0–0.1)
Basophils Relative: 0 %
Eosinophils Absolute: 0.5 10*3/uL (ref 0.0–0.5)
Eosinophils Relative: 7 %
HCT: 21.3 % — ABNORMAL LOW (ref 39.0–52.0)
Hemoglobin: 6.9 g/dL — CL (ref 13.0–17.0)
Immature Granulocytes: 0 %
Lymphocytes Relative: 16 %
Lymphs Abs: 1.2 10*3/uL (ref 0.7–4.0)
MCH: 26.2 pg (ref 26.0–34.0)
MCHC: 32.4 g/dL (ref 30.0–36.0)
MCV: 81 fL (ref 80.0–100.0)
Monocytes Absolute: 0.7 10*3/uL (ref 0.1–1.0)
Monocytes Relative: 9 %
Neutro Abs: 5.3 10*3/uL (ref 1.7–7.7)
Neutrophils Relative %: 68 %
Platelets: 292 10*3/uL (ref 150–400)
RBC: 2.63 MIL/uL — ABNORMAL LOW (ref 4.22–5.81)
RDW: 18.6 % — ABNORMAL HIGH (ref 11.5–15.5)
WBC: 7.8 10*3/uL (ref 4.0–10.5)
nRBC: 0 % (ref 0.0–0.2)

## 2023-12-31 LAB — PREPARE RBC (CROSSMATCH)

## 2023-12-31 LAB — ABO/RH: ABO/RH(D): O POS

## 2023-12-31 MED ORDER — SODIUM CHLORIDE 0.9% IV SOLUTION: Freq: Once | INTRAVENOUS | Status: AC

## 2023-12-31 MED ORDER — HEPARIN SODIUM (PORCINE) 1000 UNIT/ML IJ SOLN
INTRAMUSCULAR | Status: AC
  Filled 2023-12-31: qty 1

## 2023-12-31 MED ORDER — DARBEPOETIN ALFA 40 MCG/0.4ML IJ SOSY
40.0000 ug | PREFILLED_SYRINGE | INTRAMUSCULAR | Status: DC
  Administered 2023-12-31: 40 ug via SUBCUTANEOUS
  Filled 2023-12-31: qty 0.4

## 2023-12-31 MED ORDER — HEPARIN SODIUM (PORCINE) 1000 UNIT/ML IJ SOLN
INTRAMUSCULAR | Status: AC
  Filled 2023-12-31: qty 3

## 2023-12-31 MED ORDER — VANCOMYCIN HCL 750 MG/150ML IV SOLN
INTRAVENOUS | Status: AC
  Filled 2023-12-31: qty 150

## 2023-12-31 NOTE — Progress Notes (Signed)
 PT Cancellation Note  Patient Details Name: Russell Austin MRN: [id number] DOB: 1950-09-23   Cancelled Treatment:    Reason Eval/Treat Not Completed: Patient at procedure or test/unavailable - pt still at HD, per RN pt also needs blood transfusion once returns to unit. PT to hold today, will check back tomorrow.   Murl Zogg S, PT DPT Acute Rehabilitation Services Secure Chat Preferred  Office [phone]    Ethridge Herder 12/31/2023, 12:24 PM

## 2023-12-31 NOTE — Progress Notes (Signed)
 PHARMACY CONSULT NOTE FOR:  OUTPATIENT  PARENTERAL ANTIBIOTIC THERAPY (OPAT)  Informational as the patient will receive antibiotics with hemodialysis outpatient  Indication: Polymicrobial peritonitis Regimen: Vancomycin  750 mg/HD-TTS and Cefepime  2g/HD-TTS End date: 01/16/24  IV antibiotic discharge orders are pended. To discharging provider:  please sign these orders via discharge navigator,  Select New Orders & click on the button choice - Manage This Unsigned Work.     Thank you for allowing pharmacy to be a part of this patient's care.  Garland Junk, PharmD, BCPS, BCIDP Infectious Diseases Clinical Pharmacist 01/01/2024 1:10 PM   **Pharmacist phone directory can now be found on [email or web address] ([email or web address]).  Listed under [facility].

## 2023-12-31 NOTE — Progress Notes (Signed)
 Pt's referral for out-pt HD is pending with [facility]. Awaiting acceptance at local [facility]. Will assist as needed.   Lauraine Polite Renal Navigator [phone]

## 2023-12-31 NOTE — Progress Notes (Signed)
  Progress Note    12/31/2023 7:30 AM 1 Day Post-Op  Subjective:  no complaints   Vitals:   12/31/23 0054 12/31/23 0515  BP: 133/78 (!) 110/49  Pulse: 85 84  Resp: 18 18  Temp: 98.6 F (37 C) 98.5 F (36.9 C)  SpO2: 100% 100%   Physical Exam: Lungs:  non labored Incisions:  L internal jugular TDC without bleeding or hematoma; abd incisions c/d/i Ext: moving all ext well Abdomen:  soft, generalized tenderness  CBC    Component Value Date/Time   WBC 7.8 12/31/2023 0608   RBC 2.63 (L) 12/31/2023 0608   HGB 6.9 (LL) 12/31/2023 0608   HCT 21.3 (L) 12/31/2023 0608   PLT 292 12/31/2023 0608   MCV 81.0 12/31/2023 0608   MCH 26.2 12/31/2023 0608   MCHC 32.4 12/31/2023 0608   RDW 18.6 (H) 12/31/2023 0608   LYMPHSABS 1.2 12/31/2023 0608   MONOABS 0.7 12/31/2023 0608   EOSABS 0.5 12/31/2023 0608   BASOSABS 0.0 12/31/2023 0608    BMET    Component Value Date/Time   NA 135 12/31/2023 0608   K 3.4 (L) 12/31/2023 0608   CL 97 (L) 12/31/2023 0608   CO2 28 12/31/2023 0608   GLUCOSE 242 (H) 12/31/2023 0608   BUN 42 (H) 12/31/2023 0608   CREATININE 3.62 (H) 12/31/2023 0608   CALCIUM  6.8 (L) 12/31/2023 0608   GFRNONAA 17 (L) 12/31/2023 0608   GFRAA 47 (L) 10/28/2018 0912    INR    Component Value Date/Time   INR 1.06 02/10/2015 0734     Intake/Output Summary (Last 24 hours) at 12/31/2023 0730 Last data filed at 12/31/2023 1610 Gross per 24 hour  Intake 713 ml  Output 30 ml  Net 683 ml     Assessment/Plan:  73 y.o. male is s/p PD catheter removal and L internal jugular TDC placement 1 Day Post-Op   Abd incisions well appearing Plans noted for HD this morning via L internal jugular [facility] Call vascular with questions   Cordie Deters, PA-C [facility] [phone] 12/31/2023 7:30 AM

## 2023-12-31 NOTE — Progress Notes (Signed)
 Pt receive blood today. Complete at 1625, vital are charted. Charge RN is aware.

## 2023-12-31 NOTE — Plan of Care (Signed)
  Problem: Nutritional: Goal: Maintenance of adequate nutrition will improve Outcome: Progressing   Problem: Clinical Measurements: Goal: Respiratory complications will improve Outcome: Progressing   Problem: Nutrition: Goal: Adequate nutrition will be maintained Outcome: Progressing   Problem: Elimination: Goal: Will not experience complications related to bowel motility Outcome: Progressing Goal: Will not experience complications related to urinary retention Outcome: Progressing

## 2023-12-31 NOTE — Progress Notes (Signed)
   12/31/23 1202  Vitals  Temp 98.6 F (37 C)  Pulse Rate 73  Resp 15  BP (!) 143/74  SpO2 100 %  O2 Device Room Air  Weight 70.6 kg  Type of Weight Post-Dialysis  Oxygen Therapy  Patient Activity (if Appropriate) In bed  Pulse Oximetry Type Continuous  Oximetry Probe Site Changed No  Post Treatment  Dialyzer Clearance Lightly streaked  Hemodialysis Intake (mL) 0 mL  Liters Processed 84  Fluid Removed (mL) 1500 mL  Tolerated HD Treatment Yes   Received patient in bed to unit.  Alert and oriented.  Informed consent signed and in chart.   TX duration: Three hours and thirty minutes  Patient tolerated well.  Transported back to the room  Alert, without acute distress.  Hand-off given to patient's nurse.   Access used: Left chest HD catheter Access issues: None  Medication(s) given: vancomycin  750mg

## 2023-12-31 NOTE — Progress Notes (Signed)
 Subjective:  Seen at end of hd  today  and no cos , was not aware hd finished . HGB 6.9  for transfusion prbc  on floor   Objective Vital signs in last 24 hours: Vitals:   12/31/23 1030 12/31/23 1100 12/31/23 1130 12/31/23 1200  BP: 139/74 118/67 125/68 (!) 142/68  Pulse: 75 76 72 79  Resp: 17 17 16 17   Temp:      TempSrc:      SpO2: 100% 100% 100% 100%  Weight:      Height:       Weight change: 0 kg  Physical Exam: General: pleasant elderly chronically ill male NAD minimally  confused  Heart: RRR no MRG Lungs: CTA bilaterally nonlabored breathing Abdomen: NABS soft , surgical bandage clean dry at site of prior PD, nondistended Extremities: no pedal edema  Dialysis Access: Left IJ TDC dressing dry clear   Outpatient dialysis= prior PD patient of Dr. Kennith Peak   Problem/Plan: Recurrent peritonitis status post PD catheter removed 12/30/23 transitioning  to hemodialysis.  On IV antibiotics ESRD - HD TTS for now awaiting HD Center placement  has [facility] .  Dr Cindra Cree ordered vein mapping, eventually will need graft versus fistula Anemia of ESRD-Hgb  6.9< 7.2 for prbcs transfusion today 1 unit / aranesp 40  sq today and hold iron for now with recent peritonitis needs follow-up outpatient iron studies Secondary hyperparathyroidism -corrected calcium  8.6  , need to add on  phosphorus labs  HTN/volume -BP currently stable  on admit =amlodipine  10 mg daily, metoprolol  50 mg twice daily, losartan  100 mg daily, torsemide (he states he does not make any urine with this will DC ) should be able to taper down BP meds anticipating HD BP drop with UF . Amlodipine  to 5mg    Charlynne Coombes, PA-C [facility] Bee[facility] [phone] 12/31/2023,12:52 PM  LOS: 4 days   Labs: Basic Metabolic Panel: Recent Labs  Lab 12/27/23 0543 12/28/23 0229 12/29/23 0502 12/30/23 0438 12/31/23 0608  NA 137 143 139 142 135  K 3.4* 3.0* 3.3* 3.5 3.4*  CL 94* 97* 100 101 97*  CO2 28 34* 30 29  28   GLUCOSE 329* 194* 118* 56* 242*  BUN 25* 26* 22 29* 42*  CREATININE 2.62* 2.44* 2.13* 2.70* 3.62*  CALCIUM  8.0* 7.7* 7.4* 7.7* 6.8*  PHOS 2.6 2.1*  --   --   --    Liver Function Tests: Recent Labs  Lab 12/27/23 0543 12/28/23 0229 12/30/23 0438  AST 45*  --  22  ALT 38  --  19  ALKPHOS 108  --  89  BILITOT 0.6  --  0.9  PROT 7.2  --  6.3*  ALBUMIN  1.9* 1.8* 1.8*   Recent Labs  Lab 12/27/23 0543  LIPASE 22   No results for input(s): "AMMONIA" in the last 168 hours. CBC: Recent Labs  Lab 12/27/23 0543 12/28/23 0229 12/29/23 0502 12/30/23 0438 12/31/23 0608  WBC 6.7 9.8 10.6* 7.3 7.8  NEUTROABS  --   --  8.2* 5.2 5.3  HGB 8.1* 7.4* 7.6* 7.2* 6.9*  HCT 26.4* 23.8* 24.5* 23.6* 21.3*  MCV 83.8 82.6 83.1 83.1 81.0  PLT 426* 426* 373 333 292   Cardiac Enzymes: No results for input(s): "CKTOTAL", "CKMB", "CKMBINDEX", "TROPONINI" in the last 168 hours. CBG: Recent Labs  Lab 12/30/23 0920 12/30/23 0936 12/30/23 1145 12/30/23 1632 12/30/23 2046  GLUCAP 59* 90 123* 165* 235*    Studies/Results: DG C-Arm  1-60 Min Result Date: 12/30/2023 CLINICAL DATA:  Dialysis catheter exchange EXAM: DG C-ARM 1-60 MIN FLUOROSCOPY: Fluoroscopy Time:  29 seconds Radiation Exposure Index (if provided by the fluoroscopic device): 4.57 mGy Number of Acquired Spot Images: 0 COMPARISON:  None Available. FINDINGS: Two intraoperative spot images demonstrate left dialysis catheter. The tip is in the right atrium. IMPRESSION: As above. Electronically Signed   By: Janeece Mechanic M.D.   On: 12/30/2023 13:05   VAS US  UPPER EXT VEIN MAPPING (PRE-OP AVF) Result Date: 12/29/2023 UPPER EXTREMITY VEIN MAPPING Patient Name:  Russell Austin  Date of Exam:   12/29/2023 Medical Rec #: [id number]            Accession #:    [id number] Date of Birth: Aug 22, 1951           Patient Gender: M Patient Age:   73 years Exam Location[facility]  [facility] Procedure:      VAS US  UPPER EXT VEIN MAPPING (PRE-OP AVF)  Referring Phys: Hilario Lover PEEPLES --------------------------------------------------------------------------------  Indications: Pre-access. History: ESRD, failed peritoneal dialysis secondary to infection. CVA 2016, now          with residual right hemiparesis.  Limitations: Right arm hemiparesis, IV, bandages, tissue properties, edema Comparison Study: No prior mapping on file Performing Technologist: Carleene Chase RVS  Examination Guidelines: A complete evaluation includes B-mode imaging, spectral Doppler, color Doppler, and power Doppler as needed of all accessible portions of each vessel. Bilateral testing is considered an integral part of a complete examination. Limited examinations for reoccurring indications may be performed as noted. +-----------------+-------------+----------+--------------+ Right Cephalic   Diameter (cm)Depth (cm)   Findings    +-----------------+-------------+----------+--------------+ Prox upper arm       0.16        0.40                  +-----------------+-------------+----------+--------------+ Mid upper arm        0.12        0.38                  +-----------------+-------------+----------+--------------+ Dist upper arm       0.11        0.42                  +-----------------+-------------+----------+--------------+ Antecubital fossa                       not visualized +-----------------+-------------+----------+--------------+ Prox forearm                            not visualized +-----------------+-------------+----------+--------------+ Mid forearm                             not visualized +-----------------+-------------+----------+--------------+ Dist forearm                            not visualized +-----------------+-------------+----------+--------------+ Wrist                                   not visualized +-----------------+-------------+----------+--------------+  +-----------------+-------------+----------+--------------+ Right Basilic    Diameter (cm)Depth (cm)   Findings    +-----------------+-------------+----------+--------------+ Prox upper arm  not visualized +-----------------+-------------+----------+--------------+ Mid upper arm                           not visualized +-----------------+-------------+----------+--------------+ Dist upper arm                          not visualized +-----------------+-------------+----------+--------------+ Antecubital fossa    0.25        0.85                  +-----------------+-------------+----------+--------------+ Prox forearm         0.18        1.10                  +-----------------+-------------+----------+--------------+ Mid forearm                             not visualized +-----------------+-------------+----------+--------------+ Distal forearm                          not visualized +-----------------+-------------+----------+--------------+ Wrist                                   not visualized +-----------------+-------------+----------+--------------+ +-----------------+-------------+----------+---------------------+ Left Cephalic    Diameter (cm)Depth (cm)      Findings        +-----------------+-------------+----------+---------------------+ Prox upper arm                             not visualized     +-----------------+-------------+----------+---------------------+ Mid upper arm                              not visualized     +-----------------+-------------+----------+---------------------+ Dist upper arm                             not visualized     +-----------------+-------------+----------+---------------------+ Antecubital fossa    0.32        0.17                         +-----------------+-------------+----------+---------------------+ Prox forearm         0.18        0.29                          +-----------------+-------------+----------+---------------------+ Mid forearm          0.20        0.25                         +-----------------+-------------+----------+---------------------+ Dist forearm         0.26        0.22                         +-----------------+-------------+----------+---------------------+ Wrist                                   not visualized and IV +-----------------+-------------+----------+---------------------+ +-----------------+-------------+----------+---------------------+ Left Basilic     Diameter (cm)Depth (cm)  Findings        +-----------------+-------------+----------+---------------------+ Prox upper arm                             not visualized     +-----------------+-------------+----------+---------------------+ Mid upper arm      0.26/0.10  1.25/0.79                       +-----------------+-------------+----------+---------------------+ Dist upper arm       0.33        0.81         branching       +-----------------+-------------+----------+---------------------+ Antecubital fossa    0.29        0.64                         +-----------------+-------------+----------+---------------------+ Prox forearm         0.24        0.85                         +-----------------+-------------+----------+---------------------+ Mid forearm          0.24        1.39                         +-----------------+-------------+----------+---------------------+ Distal forearm                          not visualized and IV +-----------------+-------------+----------+---------------------+ Wrist                                   not visualized and IV +-----------------+-------------+----------+---------------------+ *See table(s) above for measurements and observations.  Diagnosing physician: Jimmye Moulds MD Electronically signed by Jimmye Moulds MD on 12/29/2023 at 6:16:56 PM.    Final    Medications:   ceFEPime  (MAXIPIME ) IV Stopped (12/30/23 2245)   vancomycin  (VANCOCIN ) 750 mg in sodium chloride  0.9 % 250 mL IVPB 750 mg (12/31/23 1059)    sodium chloride    Intravenous Once   amLODipine   5 mg Oral Daily   atorvastatin   10 mg Oral q morning   buPROPion   300 mg Oral q morning   calcitRIOL   0.5 mcg Oral Daily   Chlorhexidine  Gluconate Cloth  6 each Topical Q0600   Chlorhexidine  Gluconate Cloth  6 each Topical Q0600   gabapentin   300 mg Oral QHS   gentamicin  cream  1 Application Topical Daily   heparin  injection (subcutaneous)  5,000 Units Subcutaneous Q8H   insulin  aspart  0-15 Units Subcutaneous TID WC   insulin  aspart  2 Units Subcutaneous TID WC   insulin  glargine-yfgn  18 Units Subcutaneous QHS   losartan   100 mg Oral Daily   metoprolol  tartrate  50 mg Oral BID   pantoprazole   40 mg Oral Daily   potassium chloride   40 mEq Oral Daily   sodium chloride  flush  10-40 mL Intracatheter Q12H   sodium chloride  flush  3 mL Intravenous Q12H

## 2023-12-31 NOTE — TOC Progression Note (Addendum)
 Transition of Care [facility]) - Progression Note    Patient Details  Name: Russell Austin MRN: [id number] Date of Birth: [birth year]  [facility]) CM/SW Contact  Juliane Och, LCSW Phone Number: 12/31/2023, 12:41 PM  Clinical Narrative:     12:42 PM Patient's spouse, Ema Hands, informed CSW that she has deciding to pursue other SNF bed offers vs [facility] [employer]. CSW provided list of bed offers with Medicare ratings to [facility] (patient was in HD). [facility] bed offer at [facility]. CSW informed SNFs and medical team. [facility] informed CSW that they currently do not have bed availability but will update CSW.  Expected Discharge Plan: Skilled Nursing Facility Barriers to Discharge: Continued Medical Work up  Expected Discharge Plan and Services In-house Referral: Clinical Social Work     Living arrangements for the past 2 months: Single Family Home                                       Social Determinants of Health (SDOH) Interventions SDOH Screenings   Food Insecurity: No Food Insecurity (12/27/2023)  Housing: Low Risk  (12/27/2023)  Transportation Needs: No Transportation Needs (12/27/2023)  Utilities: Not At Risk (12/27/2023)  Alcohol Screen: Low Risk  (01/21/2023)  Depression (PHQ2-9): Low Risk  (02/28/2023)  Recent Concern: Depression (PHQ2-9) - High Risk (01/08/2023)  Financial Resource Strain: Low Risk  (01/21/2023)  Physical Activity: Inactive (01/21/2023)  Social Connections: Socially Integrated (12/27/2023)  Stress: No Stress Concern Present (01/21/2023)  Tobacco Use: Low Risk  (12/30/2023)    Readmission Risk Interventions     No data to display

## 2023-12-31 NOTE — Consult Note (Signed)
 [facility]    Date of Admission:  12/27/2023     Total days of antibiotics 5               Reason for Consult: Peritonitis   Referring Provider: Dr. Haywood Lisle Primary Care Provider: [place], NP   ASSESSMENT:  Russell Austin is a 73 year old African-American male on peritoneal dialysis complicated by recurrent peritonitis now admitted with diarrhea and vomiting and found to have recurrent polymicrobial peritonitis.  Peritoneal fluid culture with gram-positive cocci in clusters on Gram stain and Enterococcus faecalis on culture.  Other culture growth outpatient included Proteus mirabilis and Klebsiella pneumonia and Streptococcus species.  Peritoneal dialysis catheter removed and now on intermittent hemodialysis.  Discussed plan of care to treat with vancomycin  and cefepime  administered with hemodialysis for 2 to 3 weeks.  Continue wound care as needed from peritoneal site.  Hemodialysis per nephrology.  Standard/universal precautions.  Remaining medical and supportive care per internal medicine.  PLAN:  Change antibiotics to vancomycin  and cefepime . Plan to administer antibiotics with hemodialysis. Monitor blood cultures for bacteremia until finalized. Universal/standard precautions. Wound care of peritoneal dialysis catheter site removal as needed. Intermittent hemodialysis per nephrology. Remaining medical and supportive care per internal medicine.   Principal Problem:   Peritonitis (HCC) Active Problems:   Controlled diabetes mellitus with arthropathy, with long-term current use of insulin  (HCC)   Hyperlipidemia   ESRD on dialysis [facility])   Hypertensive urgency   Normocytic anemia   Legally blind    amLODipine   5 mg Oral Daily   atorvastatin   10 mg Oral q morning   buPROPion   300 mg Oral q morning   calcitRIOL   0.5 mcg Oral Daily   Chlorhexidine  Gluconate Cloth  6 each Topical Q0600   Chlorhexidine  Gluconate Cloth  6 each Topical  Q0600   darbepoetin (ARANESP) injection - NON-DIALYSIS  40 mcg Subcutaneous Q Tue-1800   gabapentin   300 mg Oral QHS   gentamicin  cream  1 Application Topical Daily   heparin  injection (subcutaneous)  5,000 Units Subcutaneous Q8H   insulin  aspart  0-15 Units Subcutaneous TID WC   insulin  aspart  2 Units Subcutaneous TID WC   insulin  glargine-yfgn  18 Units Subcutaneous QHS   losartan   100 mg Oral Daily   metoprolol  tartrate  50 mg Oral BID   pantoprazole   40 mg Oral Daily   potassium chloride   40 mEq Oral Daily   sodium chloride  flush  10-40 mL Intracatheter Q12H   sodium chloride  flush  3 mL Intravenous Q12H     HPI: Russell Austin is a 73 y.o. male with previous medical history of legal blindness, chronic kidney disease on peritoneal dialysis, type 2 diabetes complicated by diabetic retinopathy and hypertension presenting to the ED with 2 episodes of vomiting and abdominal pain.  Russell Austin presents with generalized abdominal pain, vomiting, and diarrhea.  Had been receiving ceftazidime through his peritoneal dialysis catheter for 6 hours prior to him initiating dialysis.  Afebrile on admission with no leukocytosis.  CT abdomen/pelvis with peritoneal dialysis with loculated peritoneal fluid collections in the ventral abdomen and tracking along the right liver where there could be some capsular involvement.  Peritoneal fluid from 12/27/2023 with 95 total nucleated cells and 91% neutrophils.  Peritoneal culture with gram-positive cocci on Gram stain and Enterococcus faecalis on culture.  Blood cultures drawn on 12/27/2023 with no evidence of bacteremia and pending.  Given multiple recurrent peritonitis transitioning to intermittent  HD.  Russell Austin is lethargic and history is provided by his wife.  Started feeling ill with diarrhea and projectile vomiting on 12/26/2023.  Following multiple episodes he was brought to the ED via ambulance.  Feeling better now since arriving at the  hospital.  Tunneled dialysis catheter in the left upper chest.  Abdominal pain resolved.  Dressing in place.   Review of Systems: Review of Systems  Constitutional:  Negative for chills, fever and weight loss.  Respiratory:  Negative for cough, shortness of breath and wheezing.   Cardiovascular:  Negative for chest pain and leg swelling.  Gastrointestinal:  Negative for abdominal pain, constipation, diarrhea, nausea and vomiting.  Skin:  Negative for rash.     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   Chronic kidney disease    Diabetes mellitus type II    Diabetic retinopathy FOLLOWED BY DR Seward Dao   ED (erectile dysfunction)    Glaucoma of both eyes    Hypertension    Left hydrocele    Stroke [facility])     Social History   Tobacco Use   Smoking status: Never   Smokeless tobacco: Never  Vaping Use   Vaping status: Never Used  Substance Use Topics   Alcohol use: No   Drug use: No    Family History  Problem Relation Age of Onset   Glaucoma Mother    Diabetes Mother    Stomach cancer Maternal Grandfather    Stroke Maternal Grandmother     Allergies  Allergen Reactions   Lactose Intolerance (Gi) Diarrhea   Apple Pectin [Pectin] Itching    ITCHY THROAT   Metformin  And Related Other (See Comments)    D/t decreased kidney function    Peach [Prunus Persica] Itching    ITCHY THROAT   Morphine  And Codeine Anxiety    Jittery    OBJECTIVE: Blood pressure (!) 158/71, pulse 78, temperature 98.2 F (36.8 C), temperature source Oral, resp. rate 18, height 5\' 8"  (1.727 m), weight 70.6 kg, SpO2 100%.  Physical Exam Constitutional:      General: He is not in acute distress.    Appearance: He is well-developed.     Comments: Lying in bed with head of bed elevated; lethargic and easily arousable  Cardiovascular:     Rate and Rhythm: Normal rate and regular rhythm.     Heart sounds: Normal heart sounds.   Pulmonary:     Effort: Pulmonary effort is normal.     Breath sounds: Normal breath sounds.  Skin:    General: Skin is warm and dry.  Neurological:     Mental Status: He is alert and oriented to person, place, and time.     Lab Results Lab Results  Component Value Date   WBC 7.8 12/31/2023   HGB 6.9 (LL) 12/31/2023   HCT 21.3 (L) 12/31/2023   MCV 81.0 12/31/2023   PLT 292 12/31/2023    Lab Results  Component Value Date   CREATININE 3.62 (H) 12/31/2023   BUN 42 (H) 12/31/2023   NA 135 12/31/2023   K 3.4 (L) 12/31/2023   CL 97 (L) 12/31/2023   CO2 28 12/31/2023    Lab Results  Component Value Date   ALT 19 12/30/2023   AST 22 12/30/2023   ALKPHOS 89 12/30/2023   BILITOT 0.9 12/30/2023     Microbiology: Recent Results (from the past 240 hours)  Culture, blood (  routine x 2)     Status: None (Preliminary result)   Collection Time: 12/27/23  7:27 AM   Specimen: BLOOD RIGHT HAND  Result Value Ref Range Status   Specimen Description BLOOD RIGHT HAND  Final   Special Requests   Final    BOTTLES DRAWN AEROBIC ONLY Blood Culture results may not be optimal due to an inadequate volume of blood received in culture bottles   Culture   Final    NO GROWTH 4 DAYS Performed at [facility] Lab, [address], [place], [place] [address]    Report Status PENDING  Incomplete  Culture, body fluid w Gram Stain-bottle     Status: Abnormal   Collection Time: 12/27/23  5:30 PM   Specimen: Peritoneal Washings  Result Value Ref Range Status   Specimen Description PERITONEAL  Final   Special Requests DIALYSIS  Final   Gram Stain   Final    GRAM POSITIVE COCCI IN CLUSTERS IN BOTH AEROBIC AND ANAEROBIC BOTTLES CRITICAL RESULT CALLED TO, READ BACK BY AND VERIFIED WITH: A CECIL,RN@[id number]  12/28/23 MK Performed at [facility], [address], [place], [place] [address]    Culture ENTEROCOCCUS FAECALIS (A)  Final   Report Status 12/31/2023 FINAL  Final   Organism ID, Bacteria  ENTEROCOCCUS FAECALIS  Final      Susceptibility   Enterococcus faecalis - MIC*    AMPICILLIN <=2 SENSITIVE Sensitive     VANCOMYCIN  1 SENSITIVE Sensitive     GENTAMICIN  SYNERGY SENSITIVE Sensitive     * ENTEROCOCCUS FAECALIS  Gram stain     Status: None   Collection Time: 12/27/23  5:30 PM   Specimen: Peritoneal Washings  Result Value Ref Range Status   Specimen Description PERITONEAL  Final   Special Requests DIALYSIS  Final   Gram Stain   Final    WBC PRESENT, PREDOMINANTLY PMN NO ORGANISMS SEEN CYTOSPIN SMEAR Performed at [facility] Lab, [address], [place], [place] [address]    Report Status 12/27/2023 FINAL  Final  Culture, blood (routine x 2)     Status: None (Preliminary result)   Collection Time: 12/27/23  7:22 PM   Specimen: BLOOD  Result Value Ref Range Status   Specimen Description BLOOD BLOOD LEFT ARM  Final   Special Requests   Final    BOTTLES DRAWN AEROBIC AND ANAEROBIC Blood Culture adequate volume   Culture   Final    NO GROWTH 4 DAYS Performed at [facility], [address] [place], [place] [address]    Report Status PENDING  Incomplete  Surgical PCR screen     Status: None   Collection Time: 12/29/23  9:37 PM   Specimen: Nasal Mucosa; Nasal Swab  Result Value Ref Range Status   MRSA, PCR NEGATIVE NEGATIVE Final   Staphylococcus aureus NEGATIVE NEGATIVE Final    Comment: (NOTE) The Xpert SA Assay (FDA approved for NASAL specimens in patients 4 years of age and older), is one component of a comprehensive surveillance program. It is not intended to diagnose infection nor to guide or monitor treatment. Performed at [facility], [address], [place], [place] [address]      Marlan Silva, NP [facility]  12/31/2023  2:28 PM

## 2023-12-31 NOTE — Plan of Care (Signed)
  Problem: Pain Managment: Goal: General experience of comfort will improve and/or be controlled Outcome: Progressing   Problem: Safety: Goal: Ability to remain free from injury will improve Outcome: Progressing

## 2023-12-31 NOTE — Progress Notes (Signed)
 [facility] ROUNDING NOTE JAQUILL BISH BJY:[id number]  DOB: 1951-05-30  DOA: 12/27/2023  PCP: Alto Atta, NP  12/31/2023,8:59 AM  LOS: 4 days    Code Status: Full code   from: Home current Dispo: Likely home   73 year old black male Diabetes mellitus type 2 with retinopathy glaucoma-right eye visual loss Prior lower brainstem infarct 2016 needing core track and CIR evaluation at the time HLD ESRD previously on PD since 05/2023 dry weight 75 Performed chronic disease secondary hyperparathyroidism--previous complications with PD having Enterococcus faecalis peritonitis treated with vancomycin  Developed symptoms of peritonitis 4/15 patient was started on cefepime  vancomycin  switched to ceftazidime-developed diarrhea nausea multiple vomiting episodes Directed to come to ED 4/25 by Nephrology on-call service--- PD fluid check on 4/15 showed many gram-negative coccobacilli rare gram-positive cocci in pairs and clusters with resistance to ampicillin cefazolin  Sodium 137 potassium 3.4 BUN/creatinine 25/2.6 calcium  8 phosphorus 2.6 LFTs AST 45 ALT 38 Lactic acid cycling 1.9-2.1 WBC 6.7 hemoglobin 8.1 platelet 426--BC obtained, peritoneal fluid examined showing 95 nucleated cells 91% neutrophils cloudy appearance CT ABD pelvis peritoneal dialysis with loculated peritoneal fluid collections in ventral abdomen tracking along right liver?  Subcapsular involvement given degree of right liver scalloping-small volume pleural fluid with atelectasis right more than left base His primary nephrologist who is working consulted and saw the patient Vascular surgeon evaluated patient for permanent access placement eventually Patient had a temporary trialysis catheter placed but complication of bleeding from left jugular catheter site--desmopressin  was given X2 4/28 removal PD cath, placement right-sided temporary dialysis access   Plan  Sepsis 2/2 polymicrobial dialysate infection--localized not bacteremic--removed PD  cath Gram-negative coccobacilli/gram-positive cocci pair/cluster on 4/15--growing Enterococcus 4/25--IV cefepime /vancomycin --?  Duration 3 weeks I have spoken to ID to weigh in with regards to planning [facility] x2 negative from 4/25 Sepsis physiology resolved currently  Anemia blood loss expected secondary to bleeding from left trialysis jugular site--temp cath placed for iHD Desmopressin  X2 per IR on 4/25 Hemoglobin below 7 on 4 /29--no acute bleed-suspect lab variation transfused with HD 1 unit PRBC Holding aspirin  81 at this time for now--- resume as outpatient once all bleeding risk over  Hypokalemia/ESRD now on HD instead of PD-hyper parathyroid, anemia renal disease Trialysis access catheter placed 4/25-dialysis through this not taking calcitriol  0.25 daily-reordered Requires outpatient set up with HD and placement with skilled facility at discharge  HTN Nephrology change meds to amlodipine  5 losartan  100 metoprolol  50 twice daily BP controlled   Mildly elevated LFT/transaminitis, mild thrombocytosis Both secondary to likely infection trend to normal with labs as able  DM ty 2--At home reg insulin  8 units 3 times daily  continue Lantus  18 Hypoglycemia likely secondary to n.p.o. status-currently regular insulin  2 units 3 times daily meals, moderate sliding scale  holding Glucotrol  XL 5 CBG ranging 100-236 with intermittent lows which are improved  Previous stroke 2016, HLD Dense R side deficit--using wc for the past 3 mo and debilitated--therapy to see--might require skilled placeemnt  DVT prophylaxis: heparin   Status is: Inpatient Remains inpatient appropriate because:   Requires further care--updated wife at the bedside on 4/28   Subjective:  Comfortable no fever no nausea no vomiting no chest pain at this time ROM is intact he is not bleeding from the prior access in the left chest  Objective + exam Vitals:   12/31/23 0515 12/31/23 0550 12/31/23 0807 12/31/23 0830  BP:  (!) 110/49  137/69 122/65  Pulse: 84  78 75  Resp: 18  16 14  Temp: 98.5 F (36.9 C)  98.5 F (36.9 C)   TempSrc: Oral     SpO2: 100%  99% 100%  Weight:  65.7 kg 71.6 kg   Height:       Filed Weights   12/30/23 0500 12/31/23 0550 12/31/23 0807  Weight: 65.7 kg 65.7 kg 71.6 kg    Examination:  EOMI NCAT no focal deficit no icterus no pallor no wheeze Sitting up in bed No nausea no vomiting Chest is clear no rales rhonchi--- left IJ not bleeding Abdominal incisions look good ROM is intact no focal neurological deficit Dialysis access area left upper chest is clean Neuro is intact no focal deficit   Data Reviewed: reviewed   CBC    Component Value Date/Time   WBC 7.8 12/31/2023 0608   RBC 2.63 (L) 12/31/2023 0608   HGB 6.9 (LL) 12/31/2023 0608   HCT 21.3 (L) 12/31/2023 0608   PLT 292 12/31/2023 0608   MCV 81.0 12/31/2023 0608   MCH 26.2 12/31/2023 0608   MCHC 32.4 12/31/2023 0608   RDW 18.6 (H) 12/31/2023 0608   LYMPHSABS 1.2 12/31/2023 0608   MONOABS 0.7 12/31/2023 0608   EOSABS 0.5 12/31/2023 0608   BASOSABS 0.0 12/31/2023 0608      Latest Ref Rng & Units 12/31/2023    6:08 AM 12/30/2023    4:38 AM 12/29/2023    5:02 AM  CMP  Glucose 70 - 99 mg/dL 161  56  096   BUN 8 - 23 mg/dL 42  29  22   Creatinine 0.61 - 1.24 mg/dL 0.45  4.09  8.11   Sodium 135 - 145 mmol/L 135  142  139   Potassium 3.5 - 5.1 mmol/L 3.4  3.5  3.3   Chloride 98 - 111 mmol/L 97  101  100   CO2 22 - 32 mmol/L 28  29  30    Calcium  8.9 - 10.3 mg/dL 6.8  7.7  7.4   Total Protein 6.5 - 8.1 g/dL  6.3    Total Bilirubin 0.0 - 1.2 mg/dL  0.9    Alkaline Phos 38 - 126 U/L  89    AST 15 - 41 U/L  22    ALT 0 - 44 U/L  19      Scheduled Meds:  sodium chloride    Intravenous Once   amLODipine   5 mg Oral Daily   atorvastatin   10 mg Oral q morning   buPROPion   300 mg Oral q morning   calcitRIOL   0.5 mcg Oral Daily   Chlorhexidine  Gluconate Cloth  6 each Topical Q0600   Chlorhexidine   Gluconate Cloth  6 each Topical Q0600   gabapentin   300 mg Oral QHS   gentamicin  cream  1 Application Topical Daily   heparin  injection (subcutaneous)  5,000 Units Subcutaneous Q8H   insulin  aspart  0-15 Units Subcutaneous TID WC   insulin  aspart  2 Units Subcutaneous TID WC   insulin  glargine-yfgn  18 Units Subcutaneous QHS   losartan   100 mg Oral Daily   metoprolol  tartrate  50 mg Oral BID   pantoprazole   40 mg Oral Daily   potassium chloride   40 mEq Oral Daily   sodium chloride  flush  10-40 mL Intracatheter Q12H   sodium chloride  flush  3 mL Intravenous Q12H   Continuous Infusions:  ceFEPime  (MAXIPIME ) IV Stopped (12/30/23 2245)   vancomycin  (VANCOCIN ) 750 mg in sodium chloride  0.9 % 250 mL IVPB      Time  20  Jai-Gurmukh Lawyer Washabaugh, MD  [facility]

## 2024-01-01 DIAGNOSIS — E1122 Type 2 diabetes mellitus with diabetic chronic kidney disease: Secondary | ICD-10-CM | POA: Diagnosis not present

## 2024-01-01 DIAGNOSIS — E785 Hyperlipidemia, unspecified: Secondary | ICD-10-CM

## 2024-01-01 DIAGNOSIS — D649 Anemia, unspecified: Secondary | ICD-10-CM | POA: Diagnosis not present

## 2024-01-01 DIAGNOSIS — Z992 Dependence on renal dialysis: Secondary | ICD-10-CM | POA: Diagnosis not present

## 2024-01-01 DIAGNOSIS — N186 End stage renal disease: Secondary | ICD-10-CM | POA: Diagnosis not present

## 2024-01-01 DIAGNOSIS — K659 Peritonitis, unspecified: Secondary | ICD-10-CM | POA: Diagnosis not present

## 2024-01-01 DIAGNOSIS — I160 Hypertensive urgency: Secondary | ICD-10-CM | POA: Diagnosis not present

## 2024-01-01 LAB — BPAM RBC
Blood Product Expiration Date: 202505262359
ISSUE DATE / TIME: 202504291354
Unit Type and Rh: 5100

## 2024-01-01 LAB — CBC WITH DIFFERENTIAL/PLATELET
Abs Immature Granulocytes: 0.05 10*3/uL (ref 0.00–0.07)
Basophils Absolute: 0 10*3/uL (ref 0.0–0.1)
Basophils Relative: 0 %
Eosinophils Absolute: 0.6 10*3/uL — ABNORMAL HIGH (ref 0.0–0.5)
Eosinophils Relative: 7 %
HCT: 26.2 % — ABNORMAL LOW (ref 39.0–52.0)
Hemoglobin: 8.6 g/dL — ABNORMAL LOW (ref 13.0–17.0)
Immature Granulocytes: 1 %
Lymphocytes Relative: 13 %
Lymphs Abs: 1.1 10*3/uL (ref 0.7–4.0)
MCH: 27.3 pg (ref 26.0–34.0)
MCHC: 32.8 g/dL (ref 30.0–36.0)
MCV: 83.2 fL (ref 80.0–100.0)
Monocytes Absolute: 0.8 10*3/uL (ref 0.1–1.0)
Monocytes Relative: 9 %
Neutro Abs: 6.1 10*3/uL (ref 1.7–7.7)
Neutrophils Relative %: 70 %
Platelets: 296 10*3/uL (ref 150–400)
RBC: 3.15 MIL/uL — ABNORMAL LOW (ref 4.22–5.81)
RDW: 18 % — ABNORMAL HIGH (ref 11.5–15.5)
WBC: 8.7 10*3/uL (ref 4.0–10.5)
nRBC: 0 % (ref 0.0–0.2)

## 2024-01-01 LAB — CULTURE, BLOOD (ROUTINE X 2)
Culture: NO GROWTH
Culture: NO GROWTH
Special Requests: ADEQUATE

## 2024-01-01 LAB — GLUCOSE, CAPILLARY
Glucose-Capillary: 220 mg/dL — ABNORMAL HIGH (ref 70–99)
Glucose-Capillary: 208 mg/dL — ABNORMAL HIGH (ref 70–99)
Glucose-Capillary: 201 mg/dL — ABNORMAL HIGH (ref 70–99)
Glucose-Capillary: 99 mg/dL (ref 70–99)
Glucose-Capillary: 168 mg/dL — ABNORMAL HIGH (ref 70–99)

## 2024-01-01 LAB — BASIC METABOLIC PANEL WITH GFR
Anion gap: 9 (ref 5–15)
BUN: 28 mg/dL — ABNORMAL HIGH (ref 8–23)
CO2: 24 mmol/L (ref 22–32)
Calcium: 7.4 mg/dL — ABNORMAL LOW (ref 8.9–10.3)
Chloride: 100 mmol/L (ref 98–111)
Creatinine, Ser: 2.48 mg/dL — ABNORMAL HIGH (ref 0.61–1.24)
GFR, Estimated: 27 mL/min — ABNORMAL LOW (ref >60)
Glucose, Bld: 204 mg/dL — ABNORMAL HIGH (ref 70–99)
Potassium: 3.9 mmol/L (ref 3.5–5.1)
Sodium: 133 mmol/L — ABNORMAL LOW (ref 135–145)

## 2024-01-01 LAB — TYPE AND SCREEN
ABO/RH(D): O POS
Antibody Screen: NEGATIVE
Unit division: 0

## 2024-01-01 LAB — PHOSPHORUS: Phosphorus: 2.3 mg/dL — ABNORMAL LOW (ref 2.5–4.6)

## 2024-01-01 MED ORDER — CEFEPIME IV (FOR PTA / DISCHARGE USE ONLY): 2.0000 g | INTRAVENOUS | Status: AC | PRN

## 2024-01-01 MED ORDER — VANCOMYCIN IV (FOR PTA / DISCHARGE USE ONLY): 750.0000 mg | INTRAVENOUS | Status: AC | PRN

## 2024-01-01 MED ORDER — INSULIN ASPART 100 UNIT/ML IJ SOLN
0.0000 [IU] | Freq: Three times a day (TID) | INTRAMUSCULAR | Status: DC
  Administered 2024-01-02 – 2024-01-03 (×4): 4 [IU] via SUBCUTANEOUS
  Administered 2024-01-03: 15 [IU] via SUBCUTANEOUS
  Administered 2024-01-03: 11 [IU] via SUBCUTANEOUS
  Administered 2024-01-04 (×2): 4 [IU] via SUBCUTANEOUS

## 2024-01-01 NOTE — Progress Notes (Signed)
 Subjective:  no cos , said tolerated hd yest/ awaiting  [facility]  /[facility]   Objective Vital signs in last 24 hours: Vitals:   12/31/23 1951 12/31/23 2313 01/01/24 0551 01/01/24 0746  BP:  (!) 141/64 (!) 145/73 (!) 142/68  Pulse:  73 70 69  Resp:  18 18 16   Temp: 98.6 F (37 C) 98.4 F (36.9 C) 98.6 F (37 C) 98.1 F (36.7 C)  TempSrc:  Oral Oral Oral  SpO2:  100% 100% 100%  Weight:      Height:       Weight change: 5.9 kg  Physical Exam: General: pleasant elderly chronically ill male NAD  Heart: RRR no MRG Lungs: CTA bilaterally nonlabored breathing Abdomen: NABS soft , surgical bandage clean dry at site of prior PD, nondistended Extremities: no pedal edema  Dialysis Access: Left IJ TDC dressing dry clear   Outpatient dialysis= prior PD patient of Dr. Kennith Peak   Problem/Plan: Recurrent peritonitis status post PD catheter removed 12/30/23 transitioning  to hemodialysis.  On IV antibiotics per ID = give vancomycin  750 mg  and  2 gm cefepime  post hd for a total of 3 weeks. Currently day 5 of 21 days. To take through may 15th  ESRD - HD TTS for now awaiting HD Center placement  has bed at [facility] .  Dr Cindra Cree ordered vein mapping, eventually will need graft versus fistula Anemia of ESRD-Hgb 8.6< 6.9< 7.2 for prbcs transfusion today 1 unit / aranesp 40  sq today and hold iron for now with recent peritonitis needs follow-up outpatient iron studies Secondary hyperparathyroidism -corrected calcium  8.6 , ck phos with labs  HTN/volume -BP currently stable  on admit =amlodipine  10 mg daily, metoprolol  50 mg twice daily, losartan  100 mg daily, torsemide (he states he does not make any urine with this will DC ) should be able to taper down BP meds anticipating HD BP drop with UF . Amlodipine  to 5mg   Charlynne Coombes, PA-C [facility] Beeper [phone] 01/01/2024,1:04 PM  LOS: 5 days   Labs: Basic Metabolic Panel: Recent Labs  Lab  12/27/23 0543 12/28/23 0229 12/29/23 0502 12/30/23 0438 12/31/23 0608 01/01/24 0650  NA 137 143   < > 142 135 133*  K 3.4* 3.0*   < > 3.5 3.4* 3.9  CL 94* 97*   < > 101 97* 100  CO2 28 34*   < > 29 28 24   GLUCOSE 329* 194*   < > 56* 242* 204*  BUN 25* 26*   < > 29* 42* 28*  CREATININE 2.62* 2.44*   < > 2.70* 3.62* 2.48*  CALCIUM  8.0* 7.7*   < > 7.7* 6.8* 7.4*  PHOS 2.6 2.1*  --   --   --   --    < > = values in this interval not displayed.   Liver Function Tests: Recent Labs  Lab 12/27/23 0543 12/28/23 0229 12/30/23 0438  AST 45*  --  22  ALT 38  --  19  ALKPHOS 108  --  89  BILITOT 0.6  --  0.9  PROT 7.2  --  6.3*  ALBUMIN  1.9* 1.8* 1.8*   Recent Labs  Lab 12/27/23 0543  LIPASE 22   No results for input(s): "AMMONIA" in the last 168 hours. CBC: Recent Labs  Lab 12/28/23 0229 12/28/23 0229 12/29/23 0502 12/30/23 0438 12/31/23 0608 01/01/24 0650  WBC 9.8  --  10.6* 7.3 7.8 8.7  NEUTROABS  --    < > 8.2* 5.2 5.3 6.1  HGB 7.4*  --  7.6* 7.2* 6.9* 8.6*  HCT 23.8*  --  24.5* 23.6* 21.3* 26.2*  MCV 82.6  --  83.1 83.1 81.0 83.2  PLT 426*  --  373 333 292 296   < > = values in this interval not displayed.   Cardiac Enzymes: No results for input(s): "CKTOTAL", "CKMB", "CKMBINDEX", "TROPONINI" in the last 168 hours. CBG: Recent Labs  Lab 12/31/23 1343 12/31/23 1714 12/31/23 2312 01/01/24 0809 01/01/24 1157  GLUCAP 216* 155* 220* 208* 201*    Studies/Results: No results found. Medications:  ceFEPime  (MAXIPIME ) IV 1 g (12/31/23 2345)   vancomycin  (VANCOCIN ) 750 mg in sodium chloride  0.9 % 250 mL IVPB 750 mg (12/31/23 1059)    amLODipine   5 mg Oral Daily   atorvastatin   10 mg Oral q morning   buPROPion   300 mg Oral q morning   calcitRIOL   0.5 mcg Oral Daily   Chlorhexidine  Gluconate Cloth  6 each Topical Q0600   Chlorhexidine  Gluconate Cloth  6 each Topical Q0600   darbepoetin (ARANESP) injection - NON-DIALYSIS  40 mcg Subcutaneous Q Tue-1800    gabapentin   300 mg Oral QHS   gentamicin  cream  1 Application Topical Daily   heparin  injection (subcutaneous)  5,000 Units Subcutaneous Q8H   insulin  aspart  0-15 Units Subcutaneous TID WC   insulin  aspart  2 Units Subcutaneous TID WC   insulin  glargine-yfgn  18 Units Subcutaneous QHS   losartan   100 mg Oral Daily   metoprolol  tartrate  50 mg Oral BID   pantoprazole   40 mg Oral Daily   potassium chloride   40 mEq Oral Daily   sodium chloride  flush  10-40 mL Intracatheter Q12H   sodium chloride  flush  3 mL Intravenous Q12H

## 2024-01-01 NOTE — Progress Notes (Signed)
 Physical Therapy Treatment Patient Details Name: Russell Austin MRN: [id number] DOB: [birth year] Today's Date: 01/01/2024   History of Present Illness 73 y.o. male admitted 4/25 with Sepsis. PMH: Arthritis, Blindness, Chronic kidney disease, Diabetes mellitus type II, Diabetic retinopathy, ED, Glaucoma of both eyes, Hypertension, Left hydrocele, and Stroke.    PT Comments  Pt tolerates treatment well with session focused on transfer training. Pt continues to demonstrate a posterior lean in sitting and standing, likely due to poor activation of core strength. PT provides cues for hand and foot placement along with external cues to improve anterior weight shift. Pt remains at a high risk for falls due to weakness and imbalance. Patient will benefit from continued inpatient follow up therapy, <3 hours/day.     If plan is discharge home, recommend the following: A lot of help with walking and/or transfers;A lot of help with bathing/dressing/bathroom;Assistance with cooking/housework;Direct supervision/assist for medications management;Direct supervision/assist for financial management;Assist for transportation;Help with stairs or ramp for entrance;Supervision due to cognitive status   Can travel by private vehicle     Yes  Equipment Recommendations  None recommended by PT    Recommendations for Other Services       Precautions / Restrictions Precautions Precautions: Fall Recall of Precautions/Restrictions: Intact Restrictions Weight Bearing Restrictions Per Provider Order: No     Mobility  Bed Mobility Overal bed mobility: Needs Assistance Bed Mobility: Supine to Sit     Supine to sit: Mod assist, HOB elevated, Used rails          Transfers Overall transfer level: Needs assistance Equipment used: Rolling walker (2 wheels) Transfers: Sit to/from Stand, Bed to chair/wheelchair/BSC Sit to Stand: Mod assist, Via lift equipment, From elevated surface   Step pivot transfers:  Mod assist       General transfer comment: pt stands twice with support of RW, posterior lean noted both trials but less significant on 2nd trial after cues for foot placement and anterior weight shift. 3rd sit to stand with face to face assist of PT and step pivot transfer to recliner. Final sit to stand from recliner with use of STEDY    Ambulation/Gait                   Stairs             Wheelchair Mobility     Tilt Bed    Modified Rankin (Stroke Patients Only)       Balance Overall balance assessment: Needs assistance Sitting-balance support: No upper extremity supported, Feet supported Sitting balance-Leahy Scale: Poor Sitting balance - Comments: posterior lean, minA to correct, pt can self-correct with verbal cueing Postural control: Posterior lean Standing balance support: Bilateral upper extremity supported Standing balance-Leahy Scale: Poor Standing balance comment: modA initially progressing to minA, posterior lean                            Communication Communication Communication: No apparent difficulties  Cognition Arousal: Alert Behavior During Therapy: Flat affect   PT - Cognitive impairments: History of cognitive impairments                       PT - Cognition Comments: slowed processing but otherwise appropriate Following commands: Intact Following commands impaired: Follows one step commands inconsistently    Cueing Cueing Techniques: Verbal cues, Visual cues, Gestural cues  Exercises      General Comments General comments (skin integrity,  edema, etc.): VSS on RA      Pertinent Vitals/Pain Pain Assessment Pain Assessment: No/denies pain    Home Living                          Prior Function            PT Goals (current goals can now be found in the care plan section) Acute Rehab PT Goals Patient Stated Goal: Get stronger at rehab Progress towards PT goals: Progressing toward goals     Frequency    Min 2X/week      PT Plan      Co-evaluation              AM-PAC PT "6 Clicks" Mobility   Outcome Measure  Help needed turning from your back to your side while in a flat bed without using bedrails?: A Lot Help needed moving from lying on your back to sitting on the side of a flat bed without using bedrails?: A Lot Help needed moving to and from a bed to a chair (including a wheelchair)?: A Lot Help needed standing up from a chair using your arms (e.g., wheelchair or bedside chair)?: A Lot Help needed to walk in hospital room?: Total Help needed climbing 3-5 steps with a railing? : Total 6 Click Score: 10    End of Session Equipment Utilized During Treatment: Gait belt Activity Tolerance: Patient tolerated treatment well Patient left: in chair;with call bell/phone within reach;with chair alarm set;with family/visitor present Nurse Communication: Mobility status;Need for lift equipment PT Visit Diagnosis: Unsteadiness on feet (R26.81);Muscle weakness (generalized) (M62.81);Repeated falls (R29.6);History of falling (Z91.81);Difficulty in walking, not elsewhere classified (R26.2);Other symptoms and signs involving the nervous system (R29.898);Hemiplegia and hemiparesis Hemiplegia - Right/Left: Right Hemiplegia - caused by:  (chronic CVA)     Time: 7829-5621 PT Time Calculation (min) (ACUTE ONLY): 40 min  Charges:    $Therapeutic Activity: 38-52 mins PT General Charges $$ ACUTE PT VISIT: 1 Visit                     Rexie Catena, PT, DPT Acute Rehabilitation Office [phone]    Rexie Catena 01/01/2024, 4:35 PM

## 2024-01-01 NOTE — Progress Notes (Addendum)
 Contacted [facility] admissions to request an update on pt's referral. Awaiting determination Will assist as needed.   Lauraine Polite Renal Navigator [phone]  Addendum at 3:42 pm: Pt has been accepted at [facility] on TTS 11:30 am chair time. Pt will need to arrive at 10:30 am for first treatment to complete paperwork prior to treatment. Update provided to attending, nephrologist, and CSW. Will assist as needed.

## 2024-01-01 NOTE — Progress Notes (Signed)
 PROGRESS NOTE    Russell Austin  ZOX:[id number] DOB: 07-Apr-1951 DOA: 12/27/2023 PCP: Alto Atta, NP   Brief Narrative:  The patient is a 73 year old African-American male with a past medical history significant for but not limited to diabetes mellitus type 2 with retinopathy and glaucoma with right eye visual loss, prior lower brainstem infarct in 2016 needing a core track and CIR evaluation, hyperlipidemia, ESRD who is now previously on PD but having Enterococcus faecalis peritonitis who was directed to come to the ED on 12/27/2023 by nephrology after his PD fluid check showed milligram negative cocci bacilli, rare gram-positive cocci in pairs and clusters with resistance to ampicillin, cefazolin .  He was CT of the abdomen pelvis which showed loculated peritoneal fluid collections in the ventral abdomen tracking along the right liver with subscapular involvement given degree of right liver scalloping and small volume pleural fluid without ectasis more on the right and left base.  Nephrology evaluated and vascular surgery was consulted and plan is for permanent access and he had a temporary trialysis catheter placed but had a complication of bleeding from the left jugular catheter site and had to be given desmopressin  x 2.  On 12/22/2023 his PD catheter was removed.  Patient's sepsis physiology has resolved and ID had to be consulted and they are recommending vancomycin  and cefepime  post HD for total 3 weeks.  Currently the patient is in process of CLIP and SNF once dialysis scheduling has been arranged.  Assessment and Plan:  Sepsis 2/2 Polymicrobial Dialysate : Not bacteremic--removed PD cath;  Gram-negative coccobacilli/gram-positive cocci pair/cluster on 4/15--growing Enterococcus 4/25--IV cefepime /vancomycin  for Duration 3 weeks per ID with end date of 01/16/24. BC x2 negative from 4/25. Sepsis physiology resolved currently   Anemia blood loss expected secondary to bleeding from left  trialysis jugular site superimposed on Anemia of Chronic Renal Disease: Temp cath placed for iHD; s/p Desmopressin  X2 per IR on 4/25. Hemoglobin below 7 on 4 /29--no acute bleed-suspect lab variation transfused with HD 1 unit PRBC. Hgb/Hct Trend:  Recent Labs  Lab 12/27/23 0543 12/28/23 0229 12/29/23 0502 12/30/23 0438 12/31/23 0608 01/01/24 0650  HGB 8.1* 7.4* 7.6* 7.2* 6.9* 8.6*  HCT 26.4* 23.8* 24.5* 23.6* 21.3* 26.2*  MCV 83.8 82.6 83.1 83.1 81.0 83.2  -Holding aspirin  81 at this time for now--- resume as outpatient once all bleeding risk over   ESRD now on HD instead of PD / Hyperparathyroid: BUN/Cr Trend: Recent Labs  Lab 12/27/23 0543 12/28/23 0229 12/29/23 0502 12/30/23 0438 12/31/23 0608 01/01/24 0650  BUN 25* 26* 22 29* 42* 28*  CREATININE 2.62* 2.44* 2.13* 2.70* 3.62* 2.48*  Trialysis catheter placed 4/25-dialysis through this; not taking calcitriol  0.25 daily but reordered -Phos Level is now 2.3 and Calcium  was 7.4 but not corrected for Albumin  -Requires outpatient set up with HD and placement with skilled facility at discharge -Avoid Nephrotoxic Medications, Contrast Dyes, Hypotension and Dehydration to Ensure Adequate Renal Perfusion and will need to Renally Adjust Meds -Continue to Monitor and Trend Renal Function carefully and repeat CMP in the AM   Hyponatremia: Mild. Na+ 133. CTM and Trend and repeat CMP in the AM  Essential HTN: Nephrology change meds to Amlodipine  5 mg po daily; Losartan  100 mg po Daily and Metoprolol  Tartrate 50 mg po twice daily. CTM BP per Protocol. Last BP reading was 142/68  HLD: C/w Atorvastatin  10 mg po Daily    Mildly elevated LFT/Transaminitis: Improved. Likely 2/2 infection; Current Trend:  Recent Labs  Lab  12/27/23 0543 12/30/23 0438  AST 45* 22  ALT 38 19  -CTM and Trend Hepatic Fxn carefully; Repeat CMP in the AM  Thrombocytosis: Improved. 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 status; C/w Semglee  18 units sq at bedtime, Novolog  2 units 3 times daily meals, Moderate sliding scale increased to Resistant Scale; Continue Holding Glucotrol  XL 5: CBG ranging from 155-220 with intermittent lows which are improved. C/w Gabapentin  300  mg po qHS   Previous CVA in 2016: Dense R side deficit--using wc for the past 3 mo and debilitated; PT/OT now recommending SNF; C/w Atorvastatin  10 mg po daily.   GERD/GI Prophylaxis: C/w PPI with Pantoprazole  40 mg po Daily    DVT prophylaxis: heparin  injection 5,000 Units Start: 12/31/23 0600 SCDs Start: 12/27/23 0849    Code Status: Full Code Family Communication: No family present at bedside   Disposition Plan:  Level of care: Telemetry Medical Status is: Inpatient Remains inpatient appropriate because: Needs CLIP and SNF   Consultants:  Nephrology Infectious Diseases  Vascular Surgery Interventional Radiology  Procedures:  As delineated as above  Antimicrobials:  Anti-infectives (From admission, onward)    Start     Dose/Rate Route Frequency Ordered Stop   01/01/24 0000  ceFEPime  (MAXIPIME ) IVPB        2 g Intravenous Every Dialysis 01/01/24 1310 01/15/24 2359   01/01/24 0000  vancomycin  IVPB        750 mg Intravenous Every Dialysis 01/01/24 1310 01/15/24 2359   12/31/23 1200  vancomycin  (VANCOCIN ) 750 mg in sodium chloride  0.9 % 250 mL IVPB        750 mg 265 mL/hr over 60 Minutes Intravenous Every T-Th-Sa (Hemodialysis) 12/28/23 1101     12/28/23 2245  vancomycin  (VANCOCIN ) 750 mg in sodium chloride  0.9 % 150 mL IVPB  Status:  Discontinued        750 mg 165 mL/hr over 60 Minutes Intravenous NOW 12/28/23 2236 12/28/23 2240   12/28/23 2245   vancomycin  (VANCOREADY) IVPB 750 mg/150 mL        750 mg 150 mL/hr over 60 Minutes Intravenous Once 12/28/23 2240 12/28/23 2350   12/28/23 1800  vancomycin  (VANCOCIN ) 750 mg in sodium chloride  0.9 % 150 mL IVPB  Status:  Discontinued        750 mg 165 mL/hr over 60 Minutes Intravenous  Once 12/28/23 1059 12/28/23 2236   12/27/23 1300  ceFEPIme  (MAXIPIME ) 1 g in sodium chloride  0.9 % 100 mL IVPB        1 g 200 mL/hr over 30 Minutes Intravenous Daily at 10 pm 12/27/23 1203     12/27/23 1215  vancomycin  (VANCOREADY) IVPB 1500 mg/300 mL        1,500 mg 150 mL/hr over 120 Minutes Intravenous  Once 12/27/23 1206 12/27/23 1904   12/27/23 1100  ceFEPIme  (MAXIPIME ) 2 g in sodium chloride  0.9 % 100 mL IVPB  Status:  Discontinued        2 g 200 mL/hr over 30 Minutes Intravenous Every 24 hours 12/27/23 1051 12/27/23 1133       Subjective: Seen and examined at bedside and was doing okay.  Denies any nausea or vomiting.  Has no specific complaints at this time.  States that he did not rest very much though is a "disturbing him." No other concerns or complaints this time.  Objective: Vitals:   12/31/23 1951 12/31/23 2313 01/01/24 0551 01/01/24 0746  BP:  (!) 141/64 (!) 145/73 (!) 142/68  Pulse:  73 70 69  Resp:  18 18 16   Temp: 98.6 F (37 C) 98.4 F (36.9 C) 98.6 F (37 C) 98.1 F (36.7 C)  TempSrc:  Oral Oral Oral  SpO2:  100% 100% 100%  Weight:      Height:        Intake/Output Summary (Last 24 hours) at 01/01/2024 1520 Last data filed at 12/31/2023 1700 Gross per 24 hour  Intake 430.22 ml  Output --  Net 430.22 ml   Filed Weights   12/31/23 0550 12/31/23 0807 12/31/23 1202  Weight: 65.7 kg 71.6 kg 70.6 kg   Examination: Physical Exam:  Constitutional: WN/WD elderly chronically ill-appearing African-American male in no acute distress Respiratory: Diminished to auscultation bilaterally, no wheezing, rales, rhonchi or crackles. Normal respiratory effort and patient is not  tachypenic. No accessory muscle use.  Unlabored breathing Cardiovascular: RRR, no murmurs / rubs / gallops. S1 and S2 auscultated. No extremity edema.  Abdomen: Soft, non-tender, nondistended secondary body habitus. Bowel sounds positive.  GU: Deferred. Musculoskeletal: No clubbing / cyanosis of digits/nails. No joint deformity upper and lower extremities. 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  Romberg sign and cerebellar reflexes not assessed.  Psychiatric: Normal judgment and insight. Alert and oriented x 3.  Appears calm  Data Reviewed: I have personally reviewed following labs and imaging studies  CBC: Recent Labs  Lab 12/28/23 0229 12/29/23 0502 12/30/23 0438 12/31/23 0608 01/01/24 0650  WBC 9.8 10.6* 7.3 7.8 8.7  NEUTROABS  --  8.2* 5.2 5.3 6.1  HGB 7.4* 7.6* 7.2* 6.9* 8.6*  HCT 23.8* 24.5* 23.6* 21.3* 26.2*  MCV 82.6 83.1 83.1 81.0 83.2  PLT 426* 373 333 292 296   Basic Metabolic Panel: Recent Labs  Lab 12/27/23 0543 12/28/23 0229 12/29/23 0502 12/30/23 0438 12/31/23 0608 01/01/24 0649 01/01/24 0650  NA 137 143 139 142 135  --  133*  K 3.4* 3.0* 3.3* 3.5 3.4*  --  3.9  CL 94* 97* 100 101 97*  --  100  CO2 28 34* 30 29 28   --  24  GLUCOSE 329* 194* 118* 56* 242*  --  204*  BUN 25* 26* 22 29* 42*  --  28*  CREATININE 2.62* 2.44* 2.13* 2.70* 3.62*  --  2.48*  CALCIUM  8.0* 7.7* 7.4* 7.7* 6.8*  --  7.4*  PHOS 2.6 2.1*  --   --   --  2.3*  --    GFR: Estimated Creatinine Clearance: 26 mL/min (A) (by C-G formula based on SCr of 2.48 mg/dL (H)). Liver Function Tests: Recent Labs  Lab 12/27/23 0543 12/28/23 0229 12/30/23 0438  AST 45*  --  22  ALT 38  --  19  ALKPHOS 108  --  89  BILITOT 0.6  --  0.9  PROT 7.2  --  6.3*  ALBUMIN  1.9* 1.8* 1.8*   Recent Labs  Lab 12/27/23 0543  LIPASE 22   No results for input(s): "AMMONIA" in the last 168 hours. Coagulation  Profile: No results for input(s): "INR", "PROTIME" in the last 168 hours.  Cardiac Enzymes: No results for input(s): "CKTOTAL", "CKMB", "CKMBINDEX", "TROPONINI" in the last 168 hours. BNP (last 3 results) No results for input(s): "PROBNP" in the last 8760 hours. HbA1C: No results for input(s): "HGBA1C" in the last 72 hours. CBG: Recent Labs  Lab 12/31/23 1343 12/31/23 1714 12/31/23 2312 01/01/24 0809 01/01/24 1157  GLUCAP 216* 155* 220* 208* 201*   Lipid Profile: No results for input(s): "CHOL", "HDL", "LDLCALC", "TRIG", "CHOLHDL", "LDLDIRECT" in the last 72 hours. Thyroid  Function Tests: No results for input(s): "TSH", "T4TOTAL", "FREET4", "T3FREE", "THYROIDAB" in the last 72 hours. Anemia Panel: No results for input(s): "VITAMINB12", "FOLATE", "FERRITIN", "TIBC", "IRON", "RETICCTPCT" in the last 72 hours. Sepsis Labs: Recent Labs  Lab 12/27/23 0759 12/27/23 1038  LATICACIDVEN 1.9 2.1*   Recent Results (from the past 240 hours)  Culture, blood (routine x 2)     Status: None   Collection Time: 12/27/23  7:27 AM   Specimen: BLOOD RIGHT HAND  Result Value Ref Range Status   Specimen Description BLOOD RIGHT HAND  Final   Special Requests   Final    BOTTLES DRAWN AEROBIC ONLY Blood Culture results may not be optimal due to an inadequate volume of blood received in culture bottles   Culture   Final    NO GROWTH 5 DAYS Performed at [facility], [address] [place], [place] [address]    Report Status 01/01/2024 FINAL  Final  Culture, body fluid w Gram Stain-bottle     Status: Abnormal   Collection Time: 12/27/23  5:30 PM   Specimen: Peritoneal Washings  Result Value Ref Range Status   Specimen Description PERITONEAL  Final   Special Requests DIALYSIS  Final   Gram Stain   Final    GRAM POSITIVE COCCI IN CLUSTERS IN BOTH AEROBIC AND ANAEROBIC BOTTLES CRITICAL RESULT CALLED TO, READ BACK BY AND VERIFIED WITH: A CECIL,RN@[id number]  12/28/23 MK Performed at [facility], [address], [place], [place] [address]    Culture ENTEROCOCCUS FAECALIS (A)  Final   Report Status 12/31/2023 FINAL  Final   Organism ID, Bacteria ENTEROCOCCUS FAECALIS  Final      Susceptibility   Enterococcus faecalis - MIC*    AMPICILLIN <=2 SENSITIVE Sensitive     VANCOMYCIN  1 SENSITIVE Sensitive     GENTAMICIN  SYNERGY SENSITIVE Sensitive     * ENTEROCOCCUS FAECALIS  Gram stain     Status: None   Collection Time: 12/27/23  5:30 PM   Specimen: Peritoneal Washings  Result Value Ref Range Status   Specimen Description PERITONEAL  Final   Special Requests DIALYSIS  Final   Gram Stain   Final    WBC PRESENT, PREDOMINANTLY PMN NO ORGANISMS SEEN CYTOSPIN SMEAR Performed at [facility], [address], [place], [place] [address]    Report Status 12/27/2023 FINAL  Final  Culture, blood (routine x 2)     Status: None   Collection Time: 12/27/23  7:22 PM   Specimen: BLOOD  Result Value Ref Range Status   Specimen Description BLOOD BLOOD LEFT ARM  Final   Special Requests   Final    BOTTLES DRAWN AEROBIC AND ANAEROBIC Blood Culture adequate volume   Culture   Final    NO GROWTH 5 DAYS Performed at [facility], [address], [place], [place] [address]    Report Status 01/01/2024 FINAL  Final  Surgical PCR screen     Status: None   Collection Time:  12/29/23  9:37 PM   Specimen: Nasal Mucosa; Nasal Swab  Result Value Ref Range Status   MRSA, PCR NEGATIVE NEGATIVE Final   Staphylococcus aureus NEGATIVE NEGATIVE Final    Comment: (NOTE) The Xpert SA Assay (FDA approved for NASAL specimens in patients 34 years of age and older), is one component of a comprehensive surveillance program. It is not intended to diagnose infection nor to guide or monitor treatment. Performed at [facility], [address] [place], [place] [address]     Radiology Studies: No results found.  Scheduled Meds:  amLODipine   5 mg Oral Daily   atorvastatin   10 mg Oral  q morning   buPROPion   300 mg Oral q morning   calcitRIOL   0.5 mcg Oral Daily   Chlorhexidine  Gluconate Cloth  6 each Topical Q0600   Chlorhexidine  Gluconate Cloth  6 each Topical Q0600   darbepoetin (ARANESP) injection - NON-DIALYSIS  40 mcg Subcutaneous Q Tue-1800   gabapentin   300 mg Oral QHS   gentamicin  cream  1 Application Topical Daily   heparin  injection (subcutaneous)  5,000 Units Subcutaneous Q8H   insulin  aspart  0-20 Units Subcutaneous TID WC   insulin  aspart  2 Units Subcutaneous TID WC   insulin  glargine-yfgn  18 Units Subcutaneous QHS   losartan   100 mg Oral Daily   metoprolol  tartrate  50 mg Oral BID   pantoprazole   40 mg Oral Daily   potassium chloride   40 mEq Oral Daily   sodium chloride  flush  10-40 mL Intracatheter Q12H   sodium chloride  flush  3 mL Intravenous Q12H   Continuous Infusions:  ceFEPime  (MAXIPIME ) IV 1 g (12/31/23 2345)   vancomycin  (VANCOCIN ) 750 mg in sodium chloride  0.9 % 250 mL IVPB 750 mg (12/31/23 1059)    LOS: 5 days   Aura Leeds, DO [facility] Available via Epic secure chat 7am-7pm After these hours, please refer to coverage provider listed on [email or web address] 01/01/2024, 3:20 PM

## 2024-01-01 NOTE — Plan of Care (Signed)
  Problem: Education: Goal: Ability to describe self-care measures that may prevent or decrease complications (Diabetes Survival Skills Education) will improve Outcome: Progressing   Problem: Fluid Volume: Goal: Ability to maintain a balanced intake and output will improve Outcome: Progressing   Problem: Nutritional: Goal: Maintenance of adequate nutrition will improve Outcome: Progressing   Problem: Skin Integrity: Goal: Risk for impaired skin integrity will decrease Outcome: Progressing   Problem: Activity: Goal: Risk for activity intolerance will decrease Outcome: Progressing   Problem: Nutrition: Goal: Adequate nutrition will be maintained Outcome: Progressing

## 2024-01-01 NOTE — Hospital Course (Addendum)
 The patient is a 73 year old African-American male with a past medical history significant for but not limited to diabetes mellitus type 2 with retinopathy and glaucoma with right eye visual loss, prior lower brainstem infarct in 2016 needing a core track and CIR evaluation, hyperlipidemia, ESRD who is now previously on PD but having Enterococcus faecalis peritonitis who was directed to come to the ED on 12/27/2023 by nephrology after his PD fluid check showed milligram negative cocci bacilli, rare gram-positive cocci in pairs and clusters with resistance to ampicillin, cefazolin .  He was CT of the abdomen pelvis which showed loculated peritoneal fluid collections in the ventral abdomen tracking along the right liver with subscapular involvement given degree of right liver scalloping and small volume pleural fluid without ectasis more on the right and left base.  Nephrology evaluated and vascular surgery was consulted and plan is for permanent access and he had a temporary trialysis catheter placed but had a complication of bleeding from the left jugular catheter site and had to be given desmopressin  x 2.  On 12/22/2023 his PD catheter was removed.  Patient's sepsis physiology has resolved and ID had to be consulted and they are recommending vancomycin  and cefepime  post HD for total 3 weeks.  Currently patient has been accepted at [facility] on TThSat 11:30 chair time. The SNF could not accept him until Saturday however his bed was given away and will need to continually check on bed availability. He remains medically stable for D/C at this time.   Assessment and Plan:  Sepsis 2/2 Polymicrobial Dialysate : Not bacteremic--removed PD cath;  Gram-negative coccobacilli/gram-positive cocci pair/cluster on 4/15--growing Enterococcus 4/25--IV cefepime /vancomycin  for Duration 3 weeks per ID with end date of 01/16/24. BC x2 negative from 4/25. Sepsis physiology resolved currently   Anemia Blood loss expected secondary  to bleeding from left trialysis jugular site superimposed on Anemia of Chronic Renal Disease: Temp cath placed for iHD; s/p Desmopressin  X2 per IR on 4/25. Hemoglobin below 7 on 4 /29--no acute bleed-suspect lab variation transfused with HD 1 unit PRBC. Hgb/Hct Trend now fairly and improved to 9.1/27.9; Continue Holding aspirin  81 at this time for now--- resume as outpatient once all bleeding risk over   ESRD now on HD instead of PD / Hyperparathyroid: BUN/Cr now 20/2.43; Trialysis catheter placed 4/25-dialysis through this; not taking calcitriol  0.25 daily but reordered -Phos Level was now 2.9 and Calcium  was 8.0 and corrected for Albumin  is -Requires outpatient set up with HD and placement with skilled facility at discharge and facility cannot take him until Saturday per TOC however now the patient's bed has been given away for unclear when he will be able to be discharged.  Per nephrology social worker patient will need to complete issue in a chair tomorrow and so this is being arranged. -Avoid Nephrotoxic Medications, Contrast Dyes, Hypotension and Dehydration to Ensure Adequate Renal Perfusion and will need to Renally Adjust Meds. CTM and Trend Renal Function carefully and repeat CMP in the AM   Hyponatremia: Mild. Na+ 133 -> 134 -> 131. CTM and Trend and repeat CMP in the AM. Likely to be further corrected in Dialysis  Hypomagnesemia: Mag Level was 1.6 on last check. Replete with IV Mag Sulfate 2 grams. CTM and replete as Necessary. Repeat Mag Level in the AM  Essential HTN: Nephrology change meds to Amlodipine  5 mg po daily; Losartan  100 mg po Daily and Metoprolol  Tartrate 50 mg po twice daily. CTM BP per Protocol. Last BP reading was  135/63  HLD: C/w Atorvastatin  10 mg po Daily    Mildly elevated LFT/Transaminitis: Likely 2/2 infection; Current Trend shows AST went from 45 -> 22 -> 52 on last check. ALT went from 38 -> 19 -> 26 on last check. CTM and Trend Hepatic Fxn carefully; Repeat CMP in  the AM  Thrombocytosis: Improved and resolved. 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 status; C/w Semglee  18 units sq at bedtime, Novolog  2 units 3 times daily meals, Moderate sliding scale increased to Resistant Scale; Continue Holding Glucotrol  XL 5: CBG ranging from 171-302 with intermittent lows which are improved. C/w Gabapentin  300  mg po qHS   Previous CVA in 2016: Dense R side deficit--using wc for the past 3 mo and debilitated; PT/OT now recommending SNF; C/w Atorvastatin  10 mg po daily.   GERD/GI Prophylaxis: C/w PPI with Pantoprazole  40 mg po Daily   Hypoalbuminemia: Patient's Albumin  has been 1.8 the last 3 checks. CTM and Trend and repeat CMP in the AM.

## 2024-01-02 DIAGNOSIS — N2581 Secondary hyperparathyroidism of renal origin: Secondary | ICD-10-CM | POA: Diagnosis not present

## 2024-01-02 DIAGNOSIS — Z992 Dependence on renal dialysis: Secondary | ICD-10-CM | POA: Diagnosis not present

## 2024-01-02 DIAGNOSIS — I160 Hypertensive urgency: Secondary | ICD-10-CM | POA: Diagnosis not present

## 2024-01-02 DIAGNOSIS — K659 Peritonitis, unspecified: Secondary | ICD-10-CM | POA: Diagnosis not present

## 2024-01-02 DIAGNOSIS — D649 Anemia, unspecified: Secondary | ICD-10-CM | POA: Diagnosis not present

## 2024-01-02 DIAGNOSIS — N186 End stage renal disease: Secondary | ICD-10-CM | POA: Diagnosis not present

## 2024-01-02 LAB — CBC WITH DIFFERENTIAL/PLATELET
Abs Immature Granulocytes: 0.04 10*3/uL (ref 0.00–0.07)
Basophils Absolute: 0 10*3/uL (ref 0.0–0.1)
Basophils Relative: 1 %
Eosinophils Absolute: 0.7 10*3/uL — ABNORMAL HIGH (ref 0.0–0.5)
Eosinophils Relative: 9 %
HCT: 28.3 % — ABNORMAL LOW (ref 39.0–52.0)
Hemoglobin: 9 g/dL — ABNORMAL LOW (ref 13.0–17.0)
Immature Granulocytes: 1 %
Lymphocytes Relative: 17 %
Lymphs Abs: 1.4 10*3/uL (ref 0.7–4.0)
MCH: 27.4 pg (ref 26.0–34.0)
MCHC: 31.8 g/dL (ref 30.0–36.0)
MCV: 86 fL (ref 80.0–100.0)
Monocytes Absolute: 1.1 10*3/uL — ABNORMAL HIGH (ref 0.1–1.0)
Monocytes Relative: 13 %
Neutro Abs: 4.7 10*3/uL (ref 1.7–7.7)
Neutrophils Relative %: 59 %
Platelets: 283 10*3/uL (ref 150–400)
RBC: 3.29 MIL/uL — ABNORMAL LOW (ref 4.22–5.81)
RDW: 19 % — ABNORMAL HIGH (ref 11.5–15.5)
WBC: 8 10*3/uL (ref 4.0–10.5)
nRBC: 0 % (ref 0.0–0.2)

## 2024-01-02 LAB — COMPREHENSIVE METABOLIC PANEL WITH GFR
ALT: 26 U/L (ref 0–44)
AST: 52 U/L — ABNORMAL HIGH (ref 15–41)
Albumin: 1.8 g/dL — ABNORMAL LOW (ref 3.5–5.0)
Alkaline Phosphatase: 86 U/L (ref 38–126)
Anion gap: 11 (ref 5–15)
BUN: 35 mg/dL — ABNORMAL HIGH (ref 8–23)
CO2: 22 mmol/L (ref 22–32)
Calcium: 8 mg/dL — ABNORMAL LOW (ref 8.9–10.3)
Chloride: 101 mmol/L (ref 98–111)
Creatinine, Ser: 2.96 mg/dL — ABNORMAL HIGH (ref 0.61–1.24)
GFR, Estimated: 22 mL/min — ABNORMAL LOW (ref >60)
Glucose, Bld: 220 mg/dL — ABNORMAL HIGH (ref 70–99)
Potassium: 4 mmol/L (ref 3.5–5.1)
Sodium: 134 mmol/L — ABNORMAL LOW (ref 135–145)
Total Bilirubin: 0.8 mg/dL (ref 0.0–1.2)
Total Protein: 6.3 g/dL — ABNORMAL LOW (ref 6.5–8.1)

## 2024-01-02 LAB — PHOSPHORUS: Phosphorus: 2.9 mg/dL (ref 2.5–4.6)

## 2024-01-02 LAB — MAGNESIUM: Magnesium: 1.6 mg/dL — ABNORMAL LOW (ref 1.7–2.4)

## 2024-01-02 LAB — GLUCOSE, CAPILLARY
Glucose-Capillary: 188 mg/dL — ABNORMAL HIGH (ref 70–99)
Glucose-Capillary: 188 mg/dL — ABNORMAL HIGH (ref 70–99)
Glucose-Capillary: 184 mg/dL — ABNORMAL HIGH (ref 70–99)

## 2024-01-02 LAB — VANCOMYCIN, RANDOM: Vancomycin Rm: 10 ug/mL

## 2024-01-02 MED ORDER — HEPARIN SODIUM (PORCINE) 1000 UNIT/ML IJ SOLN
INTRAMUSCULAR | Status: AC
Start: 2024-01-02 — End: ?
  Filled 2024-01-02: qty 5

## 2024-01-02 MED ORDER — VANCOMYCIN HCL IN DEXTROSE 1-5 GM/200ML-% IV SOLN
INTRAVENOUS | Status: AC
  Filled 2024-01-02: qty 200

## 2024-01-02 MED ORDER — VANCOMYCIN HCL IN DEXTROSE 1-5 GM/200ML-% IV SOLN
1000.0000 mg | INTRAVENOUS | Status: DC
  Administered 2024-01-02 – 2024-01-04 (×2): 1000 mg via INTRAVENOUS
  Filled 2024-01-02: qty 200

## 2024-01-02 MED ORDER — POTASSIUM CHLORIDE CRYS ER 20 MEQ PO TBCR
20.0000 meq | EXTENDED_RELEASE_TABLET | Freq: Every day | ORAL | Status: DC
  Administered 2024-01-03 – 2024-01-05 (×3): 20 meq via ORAL
  Filled 2024-01-02 (×3): qty 1

## 2024-01-02 MED ORDER — VANCOMYCIN HCL 750 MG/150ML IV SOLN
750.0000 mg | INTRAVENOUS | Status: DC
  Filled 2024-01-02: qty 150

## 2024-01-02 MED ORDER — CHLORHEXIDINE GLUCONATE CLOTH 2 % EX PADS
6.0000 | MEDICATED_PAD | Freq: Every day | CUTANEOUS | Status: DC
  Administered 2024-01-04 – 2024-01-05 (×2): 6 via TOPICAL

## 2024-01-02 MED ORDER — MAGNESIUM SULFATE 2 GM/50ML IV SOLN
2.0000 g | Freq: Once | INTRAVENOUS | Status: AC
  Administered 2024-01-02: 2 g via INTRAVENOUS
  Filled 2024-01-02: qty 50

## 2024-01-02 NOTE — Plan of Care (Signed)
  Problem: Education: Goal: Ability to describe self-care measures that may prevent or decrease complications (Diabetes Survival Skills Education) will improve Outcome: Progressing   Problem: Nutritional: Goal: Maintenance of adequate nutrition will improve Outcome: Progressing   Problem: Skin Integrity: Goal: Risk for impaired skin integrity will decrease Outcome: Progressing   Problem: Tissue Perfusion: Goal: Adequacy of tissue perfusion will improve Outcome: Progressing   Problem: Activity: Goal: Risk for activity intolerance will decrease Outcome: Progressing   Problem: Nutrition: Goal: Adequate nutrition will be maintained Outcome: Progressing

## 2024-01-02 NOTE — Progress Notes (Signed)
 PROGRESS NOTE    Russell Austin  ZOX:[id number] DOB: 1951-02-09 DOA: 12/27/2023 PCP: Alto Atta, NP   Brief Narrative:  The patient is a 73 year old African-American male with a past medical history significant for but not limited to diabetes mellitus type 2 with retinopathy and glaucoma with right eye visual loss, prior lower brainstem infarct in 2016 needing a core track and CIR evaluation, hyperlipidemia, ESRD who is now previously on PD but having Enterococcus faecalis peritonitis who was directed to come to the ED on 12/27/2023 by nephrology after his PD fluid check showed milligram negative cocci bacilli, rare gram-positive cocci in pairs and clusters with resistance to ampicillin, cefazolin .  He was CT of the abdomen pelvis which showed loculated peritoneal fluid collections in the ventral abdomen tracking along the right liver with subscapular involvement given degree of right liver scalloping and small volume pleural fluid without ectasis more on the right and left base.  Nephrology evaluated and vascular surgery was consulted and plan is for permanent access and he had a temporary trialysis catheter placed but had a complication of bleeding from the left jugular catheter site and had to be given desmopressin  x 2.  On 12/22/2023 his PD catheter was removed.  Patient's sepsis physiology has resolved and ID had to be consulted and they are recommending vancomycin  and cefepime  post HD for total 3 weeks.  Currently patient has been accepted at [facility] on TThSat 11:30 chair time but discussion with TOC, the SNF cannot accept him until Saturday. He is medically stable for D/C at this time.   Assessment and Plan:  Sepsis 2/2 Polymicrobial Dialysate : Not bacteremic--removed PD cath;  Gram-negative coccobacilli/gram-positive cocci pair/cluster on 4/15--growing Enterococcus 4/25--IV cefepime /vancomycin  for Duration 3 weeks per ID with end date of 01/16/24. BC x2 negative from 4/25. Sepsis  physiology resolved currently   Anemia blood loss expected secondary to bleeding from left trialysis jugular site superimposed on Anemia of Chronic Renal Disease: Temp cath placed for iHD; s/p Desmopressin  X2 per IR on 4/25. Hemoglobin below 7 on 4 /29--no acute bleed-suspect lab variation transfused with HD 1 unit PRBC. Hgb/Hct Trend now fairly and improved to 9.0/28.3; Continue Holding aspirin  81 at this time for now--- resume as outpatient once all bleeding risk over   ESRD now on HD instead of PD / Hyperparathyroid: BUN/Cr now 35/2.96; Trialysis catheter placed 4/25-dialysis through this; not taking calcitriol  0.25 daily but reordered -Phos Level is now 2.9 and Calcium  was 8.0 and corrected for Albumin  is -Requires outpatient set up with HD and placement with skilled facility at discharge and facility cannot take him until Saturday per TOC.  -Avoid Nephrotoxic Medications, Contrast Dyes, Hypotension and Dehydration to Ensure Adequate Renal Perfusion and will need to Renally Adjust Meds. CTM and Trend Renal Function carefully and repeat CMP in the AM   Hyponatremia: Mild and improving. Na+ 133 -> 134. CTM and Trend and repeat CMP in the AM. Likely to be further corrected in Dialysis  Hypomagnesemia: Mag Level is now 1.6. Replete with IV Mag Sulfate 2 grams. CTM and replete as Necessary. Repeat Mag Level in the AM  Essential HTN: Nephrology change meds to Amlodipine  5 mg po daily; Losartan  100 mg po Daily and Metoprolol  Tartrate 50 mg po twice daily. CTM BP per Protocol. Last BP reading was 146/72  HLD: C/w Atorvastatin  10 mg po Daily    Mildly elevated LFT/Transaminitis: Likely 2/2 infection; Current Trend shows AST went from 45 -> 22 -> 52. ALT  went from 38 -> 19 -> 26. CTM and Trend Hepatic Fxn carefully; Repeat CMP in the AM  Thrombocytosis: Improved and resolved. 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 status; C/w Semglee  18 units sq at bedtime, Novolog  2 units 3 times daily meals, Moderate sliding scale increased to Resistant Scale; Continue Holding Glucotrol  XL 5: CBG ranging from 99-208 with intermittent lows which are improved. C/w Gabapentin  300  mg po qHS   Previous CVA in 2016: Dense R side deficit--using wc for the past 3 mo and debilitated; PT/OT now recommending SNF; C/w Atorvastatin  10 mg po daily.   GERD/GI Prophylaxis: C/w PPI with Pantoprazole  40 mg po Daily   Hypoalbuminemia: Patient's Albumin  has been 1.8 the last 3 checks. CTM and Trend and repeat CMP in the AM.   DVT prophylaxis: heparin  injection 5,000 Units Start: 12/31/23 0600 SCDs Start: 12/27/23 0849    Code Status: Full Code Family Communication: No family present at bedside  Disposition Plan:  Level of care: Telemetry Medical Status is: Inpatient Remains inpatient appropriate because: medically stable for SNF and will need bed availability and Insurance Auth   Consultants:  Nephrology Infectious Diseases  Vascular Surgery Interventional Radiology  Procedures:  As delineated as above  Antimicrobials:  Anti-infectives (From admission, onward)    Start     Dose/Rate Route Frequency Ordered Stop   01/02/24 1245  vancomycin  (VANCOCIN ) IVPB 1000 mg/200 mL premix        1,000 mg 200 mL/hr over 60 Minutes Intravenous Every T-Th-Sa (Hemodialysis) 01/02/24 1155 01/16/24 1800   01/02/24 1200  vancomycin  (VANCOREADY) IVPB 750 mg/150 mL  Status:  Discontinued        750 mg 150 mL/hr over 60 Minutes Intravenous Every T-Th-Sa (Hemodialysis) 01/02/24 0332 01/02/24 1155   01/01/24 0000  ceFEPime  (MAXIPIME ) IVPB        2 g Intravenous Every Dialysis 01/01/24 1310 01/15/24 2359   01/01/24 0000  vancomycin  IVPB        750 mg Intravenous Every Dialysis 01/01/24 1310 01/15/24 2359   12/31/23 1200   vancomycin  (VANCOCIN ) 750 mg in sodium chloride  0.9 % 250 mL IVPB  Status:  Discontinued        750 mg 265 mL/hr over 60 Minutes Intravenous Every T-Th-Sa (Hemodialysis) 12/28/23 1101 01/02/24 0332   12/28/23 2245  vancomycin  (VANCOCIN ) 750 mg in sodium chloride  0.9 % 150 mL IVPB  Status:  Discontinued        750 mg 165 mL/hr over 60 Minutes Intravenous NOW 12/28/23 2236 12/28/23 2240   12/28/23 2245  vancomycin  (VANCOREADY) IVPB 750 mg/150 mL        750 mg 150 mL/hr over 60 Minutes Intravenous Once 12/28/23 2240 12/28/23 2350   12/28/23 1800  vancomycin  (VANCOCIN ) 750 mg in sodium chloride  0.9 % 150 mL IVPB  Status:  Discontinued        750 mg 165 mL/hr over 60 Minutes Intravenous  Once 12/28/23 1059 12/28/23 2236   12/27/23 1300  ceFEPIme  (MAXIPIME ) 1 g in sodium chloride  0.9 % 100 mL IVPB        1 g 200 mL/hr over 30 Minutes Intravenous Daily at 10 pm 12/27/23 1203     12/27/23 1215  vancomycin  (VANCOREADY) IVPB 1500 mg/300 mL  1,500 mg 150 mL/hr over 120 Minutes Intravenous  Once 12/27/23 1206 12/27/23 1904   12/27/23 1100  ceFEPIme  (MAXIPIME ) 2 g in sodium chloride  0.9 % 100 mL IVPB  Status:  Discontinued        2 g 200 mL/hr over 30 Minutes Intravenous Every 24 hours 12/27/23 1051 12/27/23 1133       Subjective: Seen and examined at bedside and he is sitting in the bed and states that he is "so-so." No nausea or vomiting.  Had no complaints of pain.  Denied any lightheadedness or dizziness.  No other concerns or complaints at this time.  Objective: Vitals:   01/01/24 1937 01/02/24 0452 01/02/24 0500 01/02/24 0827  BP: 124/63 (!) 144/69  (!) 146/72  Pulse: 67 65  64  Resp: 20 16  17   Temp: 97.9 F (36.6 C) 98.1 F (36.7 C)  97.6 F (36.4 C)  TempSrc: Oral Oral  Oral  SpO2: 100% 100%  100%  Weight:   72.1 kg   Height:        Intake/Output Summary (Last 24 hours) at 01/02/2024 1250 Last data filed at 01/02/2024 0830 Gross per 24 hour  Intake 736.21 ml  Output --   Net 736.21 ml   Filed Weights   12/31/23 0807 12/31/23 1202 01/02/24 0500  Weight: 71.6 kg 70.6 kg 72.1 kg   Examination: Physical Exam:  Constitutional: WN/WD elderly chronically ill-appearing African-American male in no acute distress Respiratory: Diminished to auscultation bilaterally with some coarse breath sounds, no wheezing, rales, rhonchi or crackles. Normal respiratory effort and patient is not tachypenic. No accessory muscle use.  Unlabored breathing Cardiovascular: RRR, no murmurs / rubs / gallops. S1 and S2 auscultated. No extremity edema.  Abdomen: Soft, non-tender, non-distended. Bowel sounds positive.  GU: Deferred. Musculoskeletal: No clubbing / cyanosis of digits/nails. 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  Romberg sign cerebellar and reflexes not assessed.  Psychiatric: Normal judgment and insight. Alert and oriented x 3. Appears calm  Data Reviewed: I have personally reviewed following labs and imaging studies  CBC: Recent Labs  Lab 12/29/23 0502 12/30/23 0438 12/31/23 0608 01/01/24 0650 01/02/24 0942  WBC 10.6* 7.3 7.8 8.7 8.0  NEUTROABS 8.2* 5.2 5.3 6.1 4.7  HGB 7.6* 7.2* 6.9* 8.6* 9.0*  HCT 24.5* 23.6* 21.3* 26.2* 28.3*  MCV 83.1 83.1 81.0 83.2 86.0  PLT 373 333 292 296 283   Basic Metabolic Panel: Recent Labs  Lab 12/27/23 0543 12/28/23 0229 12/29/23 0502 12/30/23 0438 12/31/23 0608 01/01/24 0649 01/01/24 0650 01/02/24 0942  NA 137 143 139 142 135  --  133* 134*  K 3.4* 3.0* 3.3* 3.5 3.4*  --  3.9 4.0  CL 94* 97* 100 101 97*  --  100 101  CO2 28 34* 30 29 28   --  24 22  GLUCOSE 329* 194* 118* 56* 242*  --  204* 220*  BUN 25* 26* 22 29* 42*  --  28* 35*  CREATININE 2.62* 2.44* 2.13* 2.70* 3.62*  --  2.48* 2.96*  CALCIUM  8.0* 7.7* 7.4* 7.7* 6.8*  --  7.4* 8.0*  MG  --   --   --   --   --   --   --  1.6*  PHOS  2.6 2.1*  --   --   --  2.3*  --  2.9   GFR: Estimated Creatinine  Clearance: 21.8 mL/min (A) (by C-G formula based on SCr of 2.96 mg/dL (H)). Liver Function Tests: Recent Labs  Lab 12/27/23 0543 12/28/23 0229 12/30/23 0438 01/02/24 0942  AST 45*  --  22 52*  ALT 38  --  19 26  ALKPHOS 108  --  89 86  BILITOT 0.6  --  0.9 0.8  PROT 7.2  --  6.3* 6.3*  ALBUMIN  1.9* 1.8* 1.8* 1.8*   Recent Labs  Lab 12/27/23 0543  LIPASE 22   No results for input(s): "AMMONIA" in the last 168 hours. Coagulation Profile: No results for input(s): "INR", "PROTIME" in the last 168 hours. Cardiac Enzymes: No results for input(s): "CKTOTAL", "CKMB", "CKMBINDEX", "TROPONINI" in the last 168 hours. BNP (last 3 results) No results for input(s): "PROBNP" in the last 8760 hours. HbA1C: No results for input(s): "HGBA1C" in the last 72 hours. CBG: Recent Labs  Lab 01/01/24 1157 01/01/24 1631 01/01/24 2026 01/02/24 0829 01/02/24 1145  GLUCAP 201* 99 168* 188* 188*   Lipid Profile: No results for input(s): "CHOL", "HDL", "LDLCALC", "TRIG", "CHOLHDL", "LDLDIRECT" in the last 72 hours. Thyroid  Function Tests: No results for input(s): "TSH", "T4TOTAL", "FREET4", "T3FREE", "THYROIDAB" in the last 72 hours. Anemia Panel: No results for input(s): "VITAMINB12", "FOLATE", "FERRITIN", "TIBC", "IRON", "RETICCTPCT" in the last 72 hours. Sepsis Labs: Recent Labs  Lab 12/27/23 0759 12/27/23 1038  LATICACIDVEN 1.9 2.1*   Recent Results (from the past 240 hours)  Culture, blood (routine x 2)     Status: None   Collection Time: 12/27/23  7:27 AM   Specimen: BLOOD RIGHT HAND  Result Value Ref Range Status   Specimen Description BLOOD RIGHT HAND  Final   Special Requests   Final    BOTTLES DRAWN AEROBIC ONLY Blood Culture results may not be optimal due to an inadequate volume of blood received in culture bottles   Culture   Final    NO GROWTH 5 DAYS Performed at [facility], [address]  [place], [place] [address]    Report Status 01/01/2024 FINAL  Final  Culture, body fluid w Gram Stain-bottle     Status: Abnormal   Collection Time: 12/27/23  5:30 PM   Specimen: Peritoneal Washings  Result Value Ref Range Status   Specimen Description PERITONEAL  Final   Special Requests DIALYSIS  Final   Gram Stain   Final    GRAM POSITIVE COCCI IN CLUSTERS IN BOTH AEROBIC AND ANAEROBIC BOTTLES CRITICAL RESULT CALLED TO, READ BACK BY AND VERIFIED WITH: A CECIL,RN@[id number]  12/28/23 MK Performed at [facility], [address], [place], [place] [address]    Culture ENTEROCOCCUS FAECALIS (A)  Final   Report Status 12/31/2023 FINAL  Final   Organism ID, Bacteria ENTEROCOCCUS FAECALIS  Final      Susceptibility   Enterococcus faecalis - MIC*    AMPICILLIN <=2 SENSITIVE Sensitive     VANCOMYCIN  1 SENSITIVE Sensitive     GENTAMICIN  SYNERGY SENSITIVE Sensitive     * ENTEROCOCCUS FAECALIS  Gram stain     Status: None   Collection Time: 12/27/23  5:30 PM   Specimen: Peritoneal Washings  Result Value Ref Range Status   Specimen Description PERITONEAL  Final   Special Requests DIALYSIS  Final   Gram Stain   Final    WBC PRESENT, PREDOMINANTLY PMN NO ORGANISMS SEEN CYTOSPIN SMEAR Performed at [facility], [address], [place], [place] [address]    Report Status 12/27/2023  FINAL  Final  Culture, blood (routine x 2)     Status: None   Collection Time: 12/27/23  7:22 PM   Specimen: BLOOD  Result Value Ref Range Status   Specimen Description BLOOD BLOOD LEFT ARM  Final   Special Requests   Final    BOTTLES DRAWN AEROBIC AND ANAEROBIC Blood Culture adequate volume   Culture   Final    NO GROWTH 5 DAYS Performed at [facility], [address] [place], [place] [address]    Report Status 01/01/2024 FINAL  Final  Surgical PCR screen     Status: None   Collection Time: 12/29/23  9:37 PM   Specimen: Nasal Mucosa; Nasal Swab  Result Value Ref Range Status   MRSA, PCR  NEGATIVE NEGATIVE Final   Staphylococcus aureus NEGATIVE NEGATIVE Final    Comment: (NOTE) The Xpert SA Assay (FDA approved for NASAL specimens in patients 79 years of age and older), is one component of a comprehensive surveillance program. It is not intended to diagnose infection nor to guide or monitor treatment. Performed at [facility], [address], [place], [place] [address]     Radiology Studies: No results found.  Scheduled Meds:  amLODipine   5 mg Oral Daily   atorvastatin   10 mg Oral q morning   buPROPion   300 mg Oral q morning   calcitRIOL   0.5 mcg Oral Daily   Chlorhexidine  Gluconate Cloth  6 each Topical Q0600   Chlorhexidine  Gluconate Cloth  6 each Topical Q0600   darbepoetin (ARANESP ) injection - NON-DIALYSIS  40 mcg Subcutaneous Q Tue-1800   gabapentin   300 mg Oral QHS   gentamicin  cream  1 Application Topical Daily   heparin  injection (subcutaneous)  5,000 Units Subcutaneous Q8H   insulin  aspart  0-20 Units Subcutaneous TID WC   insulin  aspart  2 Units Subcutaneous TID WC   insulin  glargine-yfgn  18 Units Subcutaneous QHS   losartan   100 mg Oral Daily   metoprolol  tartrate  50 mg Oral BID   pantoprazole   40 mg Oral Daily   [START ON 01/03/2024] potassium chloride   20 mEq Oral Daily   sodium chloride  flush  10-40 mL Intracatheter Q12H   sodium chloride  flush  3 mL Intravenous Q12H   Continuous Infusions:  ceFEPime  (MAXIPIME ) IV 1 g (01/01/24 2202)   magnesium  sulfate bolus IVPB     vancomycin       LOS: 6 days   Aura Leeds, DO [facility] Available via Epic secure chat 7am-7pm After these hours, please refer to coverage provider listed on [email or web address] 01/02/2024, 12:50 PM

## 2024-01-02 NOTE — Progress Notes (Signed)
Hd start 

## 2024-01-02 NOTE — Progress Notes (Addendum)
 Pharmacy Antibiotic Note  Russell Austin is a 73 y.o. male admitted on 12/27/2023 with peritonitis.  PD catheter removed 4/28. Pharmacy has been consulted for vancomycin  and cefepime  dosing.  preHD vancomycin  = 10 is subtherapeutic.   Plan: Increase vancomycin  to 1000mg  Q TTS post HD  Cefepime  1g daily  >> switch to 2g post HD on discharge End date 5/15  Height: 5\' 8"  (172.7 cm) Weight: 72.1 kg (158 lb 15.2 oz) IBW/kg (Calculated) : 68.4  Temp (24hrs), Avg:97.9 F (36.6 C), Min:97.6 F (36.4 C), Max:98.1 F (36.7 C)  Recent Labs  Lab 12/27/23 0759 12/27/23 1038 12/28/23 0229 12/29/23 0502 12/30/23 0438 12/31/23 0608 01/01/24 0650 01/02/24 0942  WBC  --   --    < > 10.6* 7.3 7.8 8.7 8.0  CREATININE  --   --    < > 2.13* 2.70* 3.62* 2.48* 2.96*  LATICACIDVEN 1.9 2.1*  --   --   --   --   --   --   VANCORANDOM  --   --   --   --   --   --   --  10   < > = values in this interval not displayed.    Estimated Creatinine Clearance: 21.8 mL/min (A) (by C-G formula based on SCr of 2.96 mg/dL (H)).    Allergies  Allergen Reactions   Lactose Intolerance (Gi) Diarrhea   Apple Pectin [Pectin] Itching    ITCHY THROAT   Metformin  And Related Other (See Comments)    D/t decreased kidney function    Peach [Prunus Persica] Itching    ITCHY THROAT   Morphine  And Codeine Anxiety    Jittery    Antimicrobials this admission: Vanc 4/25 >> Cefepime  4/25 >>  Dose adjustments this admission: 5/1 increase vanc   5/1 VR pred HD = 10   Microbiology results: 4/15 Kleb pneumo - R to amp, otherwise S; proteus R to ancef , E faecalis pan S 4/25 Peritoneal fluid: E. Faecalis (S-amp) 4/25 Bcx ngtd  Thank you for allowing pharmacy to be a part of this patient's care.  Dorene Gang, PharmD, BCPS, BCCP Clinical Pharmacist  Please check AMION for all [facility] Pharm[facility] phone numbers After 10:00 PM, call Main Pharmacy [phone]

## 2024-01-02 NOTE — Progress Notes (Signed)
Pre Hd 

## 2024-01-02 NOTE — Progress Notes (Signed)
 [facility] Progress Note   Subjective:   Seen in room, no complaints today. Denies SOB, CP, dizziness, nausea.   Objective Vitals:   01/01/24 1937 01/02/24 0452 01/02/24 0500 01/02/24 0827  BP: 124/63 (!) 144/69  (!) 146/72  Pulse: 67 65  64  Resp: 20 16  17   Temp: 97.9 F (36.6 C) 98.1 F (36.7 C)  97.6 F (36.4 C)  TempSrc: Oral Oral  Oral  SpO2: 100% 100%  100%  Weight:   72.1 kg   Height:       Physical Exam General: Alert male in NAD Heart: RRR, no murmurs, rubs or gallops  Lungs:CTA bilaterally, respirations unlabored Abdomen: Soft, non-distended, +BS Extremities: No edema b/l lower extremities Dialysis Access:  [facility] with intact bandage  Additional Objective Labs: Basic Metabolic Panel: Recent Labs  Lab 12/27/23 0543 12/28/23 0229 12/29/23 0502 12/30/23 0438 12/31/23 1610 01/01/24 0649 01/01/24 0650  NA 137 143   < > 142 135  --  133*  K 3.4* 3.0*   < > 3.5 3.4*  --  3.9  CL 94* 97*   < > 101 97*  --  100  CO2 28 34*   < > 29 28  --  24  GLUCOSE 329* 194*   < > 56* 242*  --  204*  BUN 25* 26*   < > 29* 42*  --  28*  CREATININE 2.62* 2.44*   < > 2.70* 3.62*  --  2.48*  CALCIUM  8.0* 7.7*   < > 7.7* 6.8*  --  7.4*  PHOS 2.6 2.1*  --   --   --  2.3*  --    < > = values in this interval not displayed.   Liver Function Tests: Recent Labs  Lab 12/27/23 0543 12/28/23 0229 12/30/23 0438  AST 45*  --  22  ALT 38  --  19  ALKPHOS 108  --  89  BILITOT 0.6  --  0.9  PROT 7.2  --  6.3*  ALBUMIN  1.9* 1.8* 1.8*   Recent Labs  Lab 12/27/23 0543  LIPASE 22   CBC: Recent Labs  Lab 12/28/23 0229 12/28/23 0229 12/29/23 0502 12/30/23 0438 12/31/23 0608 01/01/24 0650  WBC 9.8  --  10.6* 7.3 7.8 8.7  NEUTROABS  --    < > 8.2* 5.2 5.3 6.1  HGB 7.4*  --  7.6* 7.2* 6.9* 8.6*  HCT 23.8*  --  24.5* 23.6* 21.3* 26.2*  MCV 82.6  --  83.1 83.1 81.0 83.2  PLT 426*  --  373 333 292 296   < > = values in this interval not displayed.   Blood  Culture    Component Value Date/Time   SDES BLOOD BLOOD LEFT ARM 12/27/2023 1922   SPECREQUEST  12/27/2023 1922    BOTTLES DRAWN AEROBIC AND ANAEROBIC Blood Culture adequate volume   CULT  12/27/2023 1922    NO GROWTH 5 DAYS Performed at [facility], [address] [place], [place] [address]    REPTSTATUS 01/01/2024 FINAL 12/27/2023 1922    Cardiac Enzymes: No results for input(s): "CKTOTAL", "CKMB", "CKMBINDEX", "TROPONINI" in the last 168 hours. CBG: Recent Labs  Lab 01/01/24 0809 01/01/24 1157 01/01/24 1631 01/01/24 2026 01/02/24 0829  GLUCAP 208* 201* 99 168* 188*   Iron Studies: No results for input(s): "IRON", "TIBC", "TRANSFERRIN", "FERRITIN" in the last 72 hours. @lablastinr3 @ Studies/Results: No results found. Medications:  ceFEPime  (MAXIPIME ) IV 1 g (01/01/24 2202)  vancomycin       amLODipine   5 mg Oral Daily   atorvastatin   10 mg Oral q morning   buPROPion   300 mg Oral q morning   calcitRIOL   0.5 mcg Oral Daily   Chlorhexidine  Gluconate Cloth  6 each Topical Q0600   Chlorhexidine  Gluconate Cloth  6 each Topical Q0600   darbepoetin (ARANESP ) injection - NON-DIALYSIS  40 mcg Subcutaneous Q Tue-1800   gabapentin   300 mg Oral QHS   gentamicin  cream  1 Application Topical Daily   heparin  injection (subcutaneous)  5,000 Units Subcutaneous Q8H   insulin  aspart  0-20 Units Subcutaneous TID WC   insulin  aspart  2 Units Subcutaneous TID WC   insulin  glargine-yfgn  18 Units Subcutaneous QHS   losartan   100 mg Oral Daily   metoprolol  tartrate  50 mg Oral BID   pantoprazole   40 mg Oral Daily   potassium chloride   40 mEq Oral Daily   sodium chloride  flush  10-40 mL Intracatheter Q12H   sodium chloride  flush  3 mL Intravenous Q12H    Dialysis Orders: prior PD patient of Dr. Kennith Peak   Assessment/Plan: Recurrent peritonitis status post PD catheter removed 12/30/23 transitioning  to hemodialysis.  On IV antibiotics per ID,  vancomycin  750 mg  and  2  gm cefepime  post hd for a total of 3 weeks, stop date 01/16/24.  ESRD - HD TTS for now awaiting HD Center placement.  Dr Cindra Cree ordered vein mapping, eventually will need graft versus fistula Anemia of ESRD-Hgb 8.6, s/p 1 unit PRBC. Continue weekly aranesp . Holding IV Fe for now with ongoing infection Secondary hyperparathyroidism -corrected calcium  ok, check phos with next labs Hypokalemia- currently at goal on potassium chloride  40mEq daily HTN/volume -BP currently stable, continue current medications. Amlodipine  was reduced to 5mg  to prevent hypotension on HD. Appears euvolemic on exam.      Ramona Burner, PA-C 01/02/2024, 9:34 AM  [facility] Pager: [phone]

## 2024-01-02 NOTE — Progress Notes (Signed)
Pre hd 

## 2024-01-02 NOTE — Progress Notes (Signed)
 PHARMACY CONSULT NOTE FOR:  OUTPATIENT  PARENTERAL ANTIBIOTIC THERAPY (OPAT)  Informational as the patient will receive antibiotics with hemodialysis outpatient  Indication: Polymicrobial peritonitis Regimen: Vancomycin  1000 mg/HD-TTS and Cefepime  2g/HD-TTS End date: 01/16/24  IV antibiotic discharge orders are pended. To discharging provider:  please sign these orders via discharge navigator,  Select New Orders & click on the button choice - Manage This Unsigned Work.     Thank you for allowing pharmacy to be a part of this patient's care.  Dorene Gang, PharmD, BCPS, BCCP Clinical Pharmacist  Please check AMION for all [facility] Pharm[facility] phone numbers After 10:00 PM, call Main Pharmacy [phone]

## 2024-01-02 NOTE — TOC Progression Note (Signed)
 Transition of Care [facility]) - Progression Note    Patient Details  Name: Russell Austin MRN: [id number] Date of Birth: [birth year]  Transition of Care [facility]) CM/SW Contact  Katrinka Parr, [place] Phone Number: 01/02/2024, 4:28 PM  Clinical Narrative:     Russell Austin will have bed on Saturday. CSW left voicemail with [facility] requesting return call to initiate SNF auth.   Expected Discharge Plan: Skilled Nursing Facility Barriers to Discharge: Insurance Authorization  Expected Discharge Plan and Services In-house Referral: Clinical Social Work     Living arrangements for the past 2 months: Single Family Home                                       Social Determinants of Health (SDOH) Interventions SDOH Screenings   Food Insecurity: No Food Insecurity (12/27/2023)  Housing: Low Risk  (12/27/2023)  Transportation Needs: No Transportation Needs (12/27/2023)  Utilities: Not At Risk (12/27/2023)  Alcohol Screen: Low Risk  (01/21/2023)  Depression (PHQ2-9): Low Risk  (02/28/2023)  Recent Concern: Depression (PHQ2-9) - High Risk (01/08/2023)  Financial Resource Strain: Low Risk  (01/21/2023)  Physical Activity: Inactive (01/21/2023)  Social Connections: Socially Integrated (12/27/2023)  Stress: No Stress Concern Present (01/21/2023)  Tobacco Use: Low Risk  (12/30/2023)    Readmission Risk Interventions     No data to display

## 2024-01-02 NOTE — Progress Notes (Signed)
 Patient off the unit at 1948 for dialysis.

## 2024-01-03 DIAGNOSIS — I160 Hypertensive urgency: Secondary | ICD-10-CM | POA: Diagnosis not present

## 2024-01-03 DIAGNOSIS — K659 Peritonitis, unspecified: Secondary | ICD-10-CM | POA: Diagnosis not present

## 2024-01-03 DIAGNOSIS — N186 End stage renal disease: Secondary | ICD-10-CM | POA: Diagnosis not present

## 2024-01-03 DIAGNOSIS — D649 Anemia, unspecified: Secondary | ICD-10-CM | POA: Diagnosis not present

## 2024-01-03 LAB — CBC WITH DIFFERENTIAL/PLATELET
Abs Immature Granulocytes: 0.03 10*3/uL (ref 0.00–0.07)
Basophils Absolute: 0.1 10*3/uL (ref 0.0–0.1)
Basophils Relative: 1 %
Eosinophils Absolute: 0.6 10*3/uL — ABNORMAL HIGH (ref 0.0–0.5)
Eosinophils Relative: 9 %
HCT: 27.9 % — ABNORMAL LOW (ref 39.0–52.0)
Hemoglobin: 9.1 g/dL — ABNORMAL LOW (ref 13.0–17.0)
Immature Granulocytes: 0 %
Lymphocytes Relative: 16 %
Lymphs Abs: 1.1 10*3/uL (ref 0.7–4.0)
MCH: 27.8 pg (ref 26.0–34.0)
MCHC: 32.6 g/dL (ref 30.0–36.0)
MCV: 85.3 fL (ref 80.0–100.0)
Monocytes Absolute: 1 10*3/uL (ref 0.1–1.0)
Monocytes Relative: 13 %
Neutro Abs: 4.5 10*3/uL (ref 1.7–7.7)
Neutrophils Relative %: 61 %
Platelets: 265 10*3/uL (ref 150–400)
RBC: 3.27 MIL/uL — ABNORMAL LOW (ref 4.22–5.81)
RDW: 19 % — ABNORMAL HIGH (ref 11.5–15.5)
WBC: 7.3 10*3/uL (ref 4.0–10.5)
nRBC: 0 % (ref 0.0–0.2)

## 2024-01-03 LAB — GLUCOSE, CAPILLARY
Glucose-Capillary: 171 mg/dL — ABNORMAL HIGH (ref 70–99)
Glucose-Capillary: 286 mg/dL — ABNORMAL HIGH (ref 70–99)
Glucose-Capillary: 302 mg/dL — ABNORMAL HIGH (ref 70–99)
Glucose-Capillary: 151 mg/dL — ABNORMAL HIGH (ref 70–99)
Glucose-Capillary: 279 mg/dL — ABNORMAL HIGH (ref 70–99)

## 2024-01-03 LAB — BASIC METABOLIC PANEL WITH GFR
Anion gap: 12 (ref 5–15)
BUN: 20 mg/dL (ref 8–23)
CO2: 22 mmol/L (ref 22–32)
Calcium: 7.7 mg/dL — ABNORMAL LOW (ref 8.9–10.3)
Chloride: 97 mmol/L — ABNORMAL LOW (ref 98–111)
Creatinine, Ser: 2.43 mg/dL — ABNORMAL HIGH (ref 0.61–1.24)
GFR, Estimated: 28 mL/min — ABNORMAL LOW (ref >60)
Glucose, Bld: 311 mg/dL — ABNORMAL HIGH (ref 70–99)
Potassium: 4.2 mmol/L (ref 3.5–5.1)
Sodium: 131 mmol/L — ABNORMAL LOW (ref 135–145)

## 2024-01-03 MED ORDER — INSULIN GLARGINE-YFGN 100 UNIT/ML ~~LOC~~ SOLN
22.0000 [IU] | Freq: Every day | SUBCUTANEOUS | Status: DC
  Administered 2024-01-03 – 2024-01-04 (×2): 22 [IU] via SUBCUTANEOUS
  Filled 2024-01-03 (×3): qty 0.22

## 2024-01-03 NOTE — Progress Notes (Signed)
 PROGRESS NOTE    Russell Austin  HYQ:[id number] DOB: [birth year] DOA: 12/27/2023 PCP: Alto Atta, NP   Brief Narrative:  The patient is a 73 year old African-American male with a past medical history significant for but not limited to diabetes mellitus type 2 with retinopathy and glaucoma with right eye visual loss, prior lower brainstem infarct in 2016 needing a core track and CIR evaluation, hyperlipidemia, ESRD who is now previously on PD but having Enterococcus faecalis peritonitis who was directed to come to the ED on 12/27/2023 by nephrology after his PD fluid check showed milligram negative cocci bacilli, rare gram-positive cocci in pairs and clusters with resistance to ampicillin, cefazolin .  He was CT of the abdomen pelvis which showed loculated peritoneal fluid collections in the ventral abdomen tracking along the right liver with subscapular involvement given degree of right liver scalloping and small volume pleural fluid without ectasis more on the right and left base.  Nephrology evaluated and vascular surgery was consulted and plan is for permanent access and he had a temporary trialysis catheter placed but had a complication of bleeding from the left jugular catheter site and had to be given desmopressin  x 2.  On 12/22/2023 his PD catheter was removed.  Patient's sepsis physiology has resolved and ID had to be consulted and they are recommending vancomycin  and cefepime  post HD for total 3 weeks.  Currently patient has been accepted at [facility] on TThSat 11:30 chair time. The SNF could not accept him until Saturday however his bed was given away and will need to continually check on bed availability. He remains medically stable for D/C at this time.   Assessment and Plan:  Sepsis 2/2 Polymicrobial Dialysate : Not bacteremic--removed PD cath;  Gram-negative coccobacilli/gram-positive cocci pair/cluster on 4/15--growing Enterococcus 4/25--IV cefepime /vancomycin  for Duration 3 weeks  per ID with end date of 01/16/24. BC x2 negative from 4/25. Sepsis physiology resolved currently   Anemia blood loss expected secondary to bleeding from left trialysis jugular site superimposed on Anemia of Chronic Renal Disease: Temp cath placed for iHD; s/p Desmopressin  X2 per IR on 4/25. Hemoglobin below 7 on 4 /29--no acute bleed-suspect lab variation transfused with HD 1 unit PRBC. Hgb/Hct Trend now fairly and improved to 9.1/27.9; Continue Holding aspirin  81 at this time for now--- resume as outpatient once all bleeding risk over   ESRD now on HD instead of PD / Hyperparathyroid: BUN/Cr now 20/2.43; Trialysis catheter placed 4/25-dialysis through this; not taking calcitriol  0.25 daily but reordered -Phos Level was now 2.9 and Calcium  was 8.0 and corrected for Albumin  is -Requires outpatient set up with HD and placement with skilled facility at discharge and facility cannot take him until Saturday per TOC.  -Avoid Nephrotoxic Medications, Contrast Dyes, Hypotension and Dehydration to Ensure Adequate Renal Perfusion and will need to Renally Adjust Meds. CTM and Trend Renal Function carefully and repeat CMP in the AM   Hyponatremia: Mild. Na+ 133 -> 134 -> 131. CTM and Trend and repeat CMP in the AM. Likely to be further corrected in Dialysis  Hypomagnesemia: Mag Level was 1.6 on last check. Replete with IV Mag Sulfate 2 grams. CTM and replete as Necessary. Repeat Mag Level in the AM  Essential HTN: Nephrology change meds to Amlodipine  5 mg po daily; Losartan  100 mg po Daily and Metoprolol  Tartrate 50 mg po twice daily. CTM BP per Protocol. Last BP reading was 135/63  HLD: C/w Atorvastatin  10 mg po Daily    Mildly elevated LFT/Transaminitis: Likely  2/2 infection; Current Trend shows AST went from 45 -> 22 -> 52 on last check. ALT went from 38 -> 19 -> 26 on last check. CTM and Trend Hepatic Fxn carefully; Repeat CMP in the AM  Thrombocytosis: Improved and resolved. 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 status; C/w Semglee  18 units sq at bedtime, Novolog  2 units 3 times daily meals, Moderate sliding scale increased to Resistant Scale; Continue Holding Glucotrol  XL 5: CBG ranging from 171-302 with intermittent lows which are improved. C/w Gabapentin  300  mg po qHS   Previous CVA in 2016: Dense R side deficit--using wc for the past 3 mo and debilitated; PT/OT now recommending SNF; C/w Atorvastatin  10 mg po daily.   GERD/GI Prophylaxis: C/w PPI with Pantoprazole  40 mg po Daily   Hypoalbuminemia: Patient's Albumin  has been 1.8 the last 3 checks. CTM and Trend and repeat CMP in the AM.   DVT prophylaxis: heparin  injection 5,000 Units Start: 12/31/23 0600 SCDs Start: 12/27/23 0849    Code Status: Full Code Family Communication: D/w family present at bedside  Disposition Plan:  Level of care: Telemetry Medical Status is: Inpatient Remains inpatient appropriate because: Medically stable for D/C but disposition is an issue given lack of bed   Consultants:  Nephrology Infectious Diseases  Vascular Surgery Interventional Radiology  Procedures:  As delineated as above   Antimicrobials:  Anti-infectives (From admission, onward)    Start     Dose/Rate Route Frequency Ordered Stop   01/02/24 1245  vancomycin  (VANCOCIN ) IVPB 1000 mg/200 mL premix        1,000 mg 200 mL/hr over 60 Minutes Intravenous Every T-Th-Sa (Hemodialysis) 01/02/24 1155 01/16/24 1800   01/02/24 1200  vancomycin  (VANCOREADY) IVPB 750 mg/150 mL  Status:  Discontinued        750 mg 150 mL/hr over 60 Minutes Intravenous Every T-Th-Sa (Hemodialysis) 01/02/24 0332 01/02/24 1155   01/01/24 0000  ceFEPime  (MAXIPIME ) IVPB        2 g Intravenous Every Dialysis 01/01/24 1310 01/15/24 2359   01/01/24 0000   vancomycin  IVPB        750 mg Intravenous Every Dialysis 01/01/24 1310 01/15/24 2359   12/31/23 1200  vancomycin  (VANCOCIN ) 750 mg in sodium chloride  0.9 % 250 mL IVPB  Status:  Discontinued        750 mg 265 mL/hr over 60 Minutes Intravenous Every T-Th-Sa (Hemodialysis) 12/28/23 1101 01/02/24 0332   12/28/23 2245  vancomycin  (VANCOCIN ) 750 mg in sodium chloride  0.9 % 150 mL IVPB  Status:  Discontinued        750 mg 165 mL/hr over 60 Minutes Intravenous NOW 12/28/23 2236 12/28/23 2240   12/28/23 2245  vancomycin  (VANCOREADY) IVPB 750 mg/150 mL        750 mg 150 mL/hr over 60 Minutes Intravenous Once 12/28/23 2240 12/28/23 2350   12/28/23 1800  vancomycin  (VANCOCIN ) 750 mg in sodium chloride  0.9 % 150 mL IVPB  Status:  Discontinued        750 mg 165 mL/hr over 60 Minutes Intravenous  Once 12/28/23 1059 12/28/23 2236   12/27/23 1300  ceFEPIme  (MAXIPIME ) 1 g in sodium chloride  0.9 % 100 mL IVPB        1 g 200 mL/hr over 30 Minutes  Intravenous Daily at 10 pm 12/27/23 1203     12/27/23 1215  vancomycin  (VANCOREADY) IVPB 1500 mg/300 mL        1,500 mg 150 mL/hr over 120 Minutes Intravenous  Once 12/27/23 1206 12/27/23 1904   12/27/23 1100  ceFEPIme  (MAXIPIME ) 2 g in sodium chloride  0.9 % 100 mL IVPB  Status:  Discontinued        2 g 200 mL/hr over 30 Minutes Intravenous Every 24 hours 12/27/23 1051 12/27/23 1133       Subjective: Seen and examined at bedside and sitting in a chair and feels okay.  Denying complaints or concerns at this time.  No nausea or vomiting.    Objective: Vitals:   01/03/24 0023 01/03/24 0103 01/03/24 0530 01/03/24 0843  BP: 127/61 130/62 122/62 135/63  Pulse: 73 73 71 71  Resp: 16 17 18 17   Temp: 98.9 F (37.2 C) 98.7 F (37.1 C) 98.4 F (36.9 C) 98.2 F (36.8 C)  TempSrc: Oral Oral Oral Oral  SpO2: 100% 100% 100% 100%  Weight:   70.6 kg   Height:        Intake/Output Summary (Last 24 hours) at 01/03/2024 1432 Last data filed at 01/03/2024 0457 Gross per  24 hour  Intake 448.33 ml  Output 1500 ml  Net -1051.67 ml   Filed Weights   12/31/23 1202 01/02/24 0500 01/03/24 0530  Weight: 70.6 kg 72.1 kg 70.6 kg   Examination: Physical Exam:  Constitutional: WN/WD, elderly chronically appearing African-American male in no acute distress Respiratory: Diminished to auscultation bilaterally with some coarse breath sounds, no wheezing, rales, rhonchi or crackles. Normal respiratory effort and patient is not tachypenic. No accessory muscle use.  Unlabored breathing Cardiovascular: RRR, no murmurs / rubs / gallops. S1 and S2 auscultated. No extremity edema.  Abdomen: Soft, non-tender, non-distended. Bowel sounds positive.  GU: Deferred. Musculoskeletal: No clubbing / cyanosis of digits/nails. No joint deformity upper and lower extremities.  Neurologic: CN 2-12 grossly intact with no focal deficits.  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 Psychiatric: Normal judgment and insight. Alert and oriented x 3.   Data Reviewed: I have personally reviewed following labs and imaging studies  CBC: Recent Labs  Lab 12/30/23 0438 12/31/23 0608 01/01/24 0650 01/02/24 0942 01/03/24 0922  WBC 7.3 7.8 8.7 8.0 7.3  NEUTROABS 5.2 5.3 6.1 4.7 4.5  HGB 7.2* 6.9* 8.6* 9.0* 9.1*  HCT 23.6* 21.3* 26.2* 28.3* 27.9*  MCV 83.1 81.0 83.2 86.0 85.3  PLT 333 292 296 283 265   Basic Metabolic Panel: Recent Labs  Lab 12/28/23 0229 12/29/23 0502 12/30/23 0438 12/31/23 4098 01/01/24 0649 01/01/24 0650 01/02/24 0942 01/03/24 0922  NA 143   < > 142 135  --  133* 134* 131*  K 3.0*   < > 3.5 3.4*  --  3.9 4.0 4.2  CL 97*   < > 101 97*  --  100 101 97*  CO2 34*   < > 29 28  --  24 22 22   GLUCOSE 194*   < > 56* 242*  --  204* 220* 311*  BUN 26*   < > 29* 42*  --  28* 35* 20  CREATININE 2.44*   < > 2.70* 3.62*  --  2.48* 2.96* 2.43*  CALCIUM  7.7*   < > 7.7* 6.8*  --  7.4* 8.0* 7.7*  MG  --   --   --   --   --   --  1.6*  --   PHOS 2.1*  --    --   --  2.3*  --  2.9  --    < > = values in this interval not displayed.   GFR: Estimated Creatinine Clearance: 26.6 mL/min (A) (by C-G formula based on SCr of 2.43 mg/dL (H)). Liver Function Tests: Recent Labs  Lab 12/28/23 0229 12/30/23 0438 01/02/24 0942  AST  --  22 52*  ALT  --  19 26  ALKPHOS  --  89 86  BILITOT  --  0.9 0.8  PROT  --  6.3* 6.3*  ALBUMIN  1.8* 1.8* 1.8*   No results for input(s): "LIPASE", "AMYLASE" in the last 168 hours. No results for input(s): "AMMONIA" in the last 168 hours. Coagulation Profile: No results for input(s): "INR", "PROTIME" in the last 168 hours. Cardiac Enzymes: No results for input(s): "CKTOTAL", "CKMB", "CKMBINDEX", "TROPONINI" in the last 168 hours. BNP (last 3 results) No results for input(s): "PROBNP" in the last 8760 hours. HbA1C: No results for input(s): "HGBA1C" in the last 72 hours. CBG: Recent Labs  Lab 01/02/24 1145 01/02/24 1641 01/03/24 0101 01/03/24 0841 01/03/24 1158  GLUCAP 188* 184* 171* 286* 302*   Lipid Profile: No results for input(s): "CHOL", "HDL", "LDLCALC", "TRIG", "CHOLHDL", "LDLDIRECT" in the last 72 hours. Thyroid  Function Tests: No results for input(s): "TSH", "T4TOTAL", "FREET4", "T3FREE", "THYROIDAB" in the last 72 hours. Anemia Panel: No results for input(s): "VITAMINB12", "FOLATE", "FERRITIN", "TIBC", "IRON", "RETICCTPCT" in the last 72 hours. Sepsis Labs: No results for input(s): "PROCALCITON", "LATICACIDVEN" in the last 168 hours.  Recent Results (from the past 240 hours)  Culture, blood (routine x 2)     Status: None   Collection Time: 12/27/23  7:27 AM   Specimen: BLOOD RIGHT HAND  Result Value Ref Range Status   Specimen Description BLOOD RIGHT HAND  Final   Special Requests   Final    BOTTLES DRAWN AEROBIC ONLY Blood Culture results may not be optimal due to an inadequate volume of blood received in culture bottles   Culture   Final    NO GROWTH 5 DAYS Performed at [facility], [address], [place], [place] [address]    Report Status 01/01/2024 FINAL  Final  Culture, body fluid w Gram Stain-bottle     Status: Abnormal   Collection Time: 12/27/23  5:30 PM   Specimen: Peritoneal Washings  Result Value Ref Range Status   Specimen Description PERITONEAL  Final   Special Requests DIALYSIS  Final   Gram Stain   Final    GRAM POSITIVE COCCI IN CLUSTERS IN BOTH AEROBIC AND ANAEROBIC BOTTLES CRITICAL RESULT CALLED TO, READ BACK BY AND VERIFIED WITH: A CECIL,RN@[id number]  12/28/23 MK Performed at [facility], [address], [place], [place] [address]    Culture ENTEROCOCCUS FAECALIS (A)  Final   Report Status 12/31/2023 FINAL  Final   Organism ID, Bacteria ENTEROCOCCUS FAECALIS  Final      Susceptibility   Enterococcus faecalis - MIC*    AMPICILLIN <=2 SENSITIVE Sensitive     VANCOMYCIN  1 SENSITIVE Sensitive     GENTAMICIN  SYNERGY SENSITIVE Sensitive     * ENTEROCOCCUS FAECALIS  Gram stain     Status: None   Collection Time: 12/27/23  5:30 PM   Specimen: Peritoneal Washings  Result Value Ref Range Status   Specimen Description PERITONEAL  Final   Special Requests DIALYSIS  Final   Gram Stain   Final  WBC PRESENT, PREDOMINANTLY PMN NO ORGANISMS SEEN CYTOSPIN SMEAR Performed at [facility], [address] [place], [place] [address]    Report Status 12/27/2023 FINAL  Final  Culture, blood (routine x 2)     Status: None   Collection Time: 12/27/23  7:22 PM   Specimen: BLOOD  Result Value Ref Range Status   Specimen Description BLOOD BLOOD LEFT ARM  Final   Special Requests   Final    BOTTLES DRAWN AEROBIC AND ANAEROBIC Blood Culture adequate volume   Culture   Final    NO GROWTH 5 DAYS Performed at [facility], [address] [place], [place] [address]    Report Status 01/01/2024 FINAL  Final  Surgical PCR screen     Status: None   Collection Time: 12/29/23  9:37 PM   Specimen: Nasal Mucosa; Nasal Swab  Result Value Ref  Range Status   MRSA, PCR NEGATIVE NEGATIVE Final   Staphylococcus aureus NEGATIVE NEGATIVE Final    Comment: (NOTE) The Xpert SA Assay (FDA approved for NASAL specimens in patients 84 years of age and older), is one component of a comprehensive surveillance program. It is not intended to diagnose infection nor to guide or monitor treatment. Performed at [facility], [address], [place], [place] [address]     Radiology Studies: No results found.  Scheduled Meds:  amLODipine   5 mg Oral Daily   atorvastatin   10 mg Oral q morning   buPROPion   300 mg Oral q morning   calcitRIOL   0.5 mcg Oral Daily   Chlorhexidine  Gluconate Cloth  6 each Topical Q0600   darbepoetin (ARANESP ) injection - NON-DIALYSIS  40 mcg Subcutaneous Q Tue-1800   gabapentin   300 mg Oral QHS   gentamicin  cream  1 Application Topical Daily   heparin  injection (subcutaneous)  5,000 Units Subcutaneous Q8H   insulin  aspart  0-20 Units Subcutaneous TID WC   insulin  aspart  2 Units Subcutaneous TID WC   insulin  glargine-yfgn  22 Units Subcutaneous QHS   losartan   100 mg Oral Daily   metoprolol  tartrate  50 mg Oral BID   pantoprazole   40 mg Oral Daily   potassium chloride   20 mEq Oral Daily   sodium chloride  flush  10-40 mL Intracatheter Q12H   sodium chloride  flush  3 mL Intravenous Q12H   Continuous Infusions:  ceFEPime  (MAXIPIME ) IV Stopped (01/03/24 0307)   vancomycin  Stopped (01/02/24 2351)    LOS: 7 days   Aura Leeds, DO [facility] Available via Epic secure chat 7am-7pm After these hours, please refer to coverage provider listed on [email or web address] 01/03/2024, 2:32 PM

## 2024-01-03 NOTE — Progress Notes (Signed)
 Hemo dialysis nurse called and stated the patient performed 3.5 hours of treatment and that 1.5 liters was removed.

## 2024-01-03 NOTE — Plan of Care (Signed)
  Problem: Education: Goal: Ability to describe self-care measures that may prevent or decrease complications (Diabetes Survival Skills Education) will improve Outcome: Progressing   Problem: Coping: Goal: Ability to adjust to condition or change in health will improve Outcome: Progressing   Problem: Fluid Volume: Goal: Ability to maintain a balanced intake and output will improve Outcome: Progressing   Problem: Metabolic: Goal: Ability to maintain appropriate glucose levels will improve Outcome: Progressing   Problem: Nutritional: Goal: Maintenance of adequate nutrition will improve Outcome: Progressing   Problem: Skin Integrity: Goal: Risk for impaired skin integrity will decrease Outcome: Progressing   

## 2024-01-03 NOTE — TOC Progression Note (Signed)
 Transition of Care [facility]) - Progression Note    Patient Details  Name: Russell Austin MRN: [id number] Date of Birth: 1951/03/14  [facility]) CM/SW Contact  Katrinka Parr, [place] Phone Number: 01/03/2024, 11:15 AM  Clinical Narrative:     [name] will require pt to be able to sit for HD prior to admitting. Renal Navigator notified and has notified HD team plan for pt to sit in HD tomorrow.   CSW spoke with pt and pt's spouse regarding disposition. She was interested in [facility] and [facility] though they cannot offer a bed. Pt and spouse agreeable to [facility]. Sent referral to financial counselors as well.   CSW called [employer] and initiate SNF and PTAR auth request. [facility] is pending.   1100: CSW notified by [facility] that they have filled last male bed. Informed that TOC can check on bed availability tomorrow. Weekend contact is [place] [phone].   Expected Discharge Plan: Skilled Nursing Facility Barriers to Discharge: Insurance Authorization  Expected Discharge Plan and Services In-house Referral: Clinical Social Work     Living arrangements for the past 2 months: Single Family Home                                       Social Determinants of Health (SDOH) Interventions SDOH Screenings   Food Insecurity: No Food Insecurity (12/27/2023)  Housing: Low Risk  (12/27/2023)  Transportation Needs: No Transportation Needs (12/27/2023)  Utilities: Not At Risk (12/27/2023)  Alcohol Screen: Low Risk  (01/21/2023)  Depression (PHQ2-9): Low Risk  (02/28/2023)  Recent Concern: Depression (PHQ2-9) - High Risk (01/08/2023)  Financial Resource Strain: Low Risk  (01/21/2023)  Physical Activity: Inactive (01/21/2023)  Social Connections: Socially Integrated (12/27/2023)  Stress: No Stress Concern Present (01/21/2023)  Tobacco Use: Low Risk  (12/30/2023)    Readmission Risk Interventions     No data to display

## 2024-01-03 NOTE — Plan of Care (Signed)

## 2024-01-03 NOTE — Progress Notes (Signed)
 Received patient in bed to unit.  Alert and oriented.  Informed consent signed and in chart.   TX duration: 3.5 hrs  Patient tolerated well.  Transported back to the room  Alert, without acute distress.  Hand-off given to patient's nurse.   Access used: HD Catheter Access issues: No  Total UF removed: Medication(s) given: Vancomycin  1g/250 D5W IV Post HD VS: 127/67 Post HD weight: unable to obtain   01/03/24 0023  Vitals  Temp 98.9 F (37.2 C)  Temp Source Oral  BP 127/61  MAP (mmHg) 81  BP Location Right Arm  BP Method Automatic  Patient Position (if appropriate) Lying  Pulse Rate 73  Pulse Rate Source Monitor  ECG Heart Rate 76  Resp 16  Oxygen Therapy  SpO2 100 %  O2 Device Room Air  Patient Activity (if Appropriate) In bed  During Treatment Monitoring  Blood Flow Rate (mL/min) 0 mL/min  Arterial Pressure (mmHg) -1.01 mmHg  Venous Pressure (mmHg) -1.41 mmHg  TMP (mmHg) -50.1 mmHg  Ultrafiltration Rate (mL/min) 602 mL/min  Dialysate Flow Rate (mL/min) 0 ml/min  Duration of HD Treatment -hour(s) 3.5 hour(s)  Cumulative Fluid Removed (mL) per Treatment  1500.11  Intra-Hemodialysis Comments Tolerated well;Tx completed  Dialysis Fluid Bolus Normal Saline  Bolus Amount (mL) 300 mL  Post Treatment  Dialyzer Clearance Lightly streaked  Hemodialysis Intake (mL) 0 mL  Liters Processed 84  Fluid Removed (mL) 1500 mL  Tolerated HD Treatment Yes  Post-Hemodialysis Comments conscious axo x4  Hemodialysis Catheter Left Internal jugular Double lumen Permanent (Tunneled)  Placement Date/Time: 12/30/23 0831   Placed prior to admission: No  Serial / Lot #: [id number]  Expiration Date: 12/02/27  Time Out: Correct patient;Correct site;Correct procedure  Maximum sterile barrier precautions: Hand hygiene;Large sterile sheet;C...  Site Condition No complications  Blue Lumen Status Flushed;Heparin  locked;Dead end cap in place  Red Lumen Status Flushed;Heparin  locked;Dead  end cap in place  Purple Lumen Status N/A  Catheter fill solution Heparin  1000 units/ml  Catheter fill volume (Arterial) 2.1 cc  Catheter fill volume (Venous) 2.1  Dressing Status Antimicrobial disc/dressing in place;Clean, Dry, Intact  Drainage Description None  Post treatment catheter status Capped and Clamped     01/03/24 0023  Vitals  Temp 98.9 F (37.2 C)  Temp Source Oral  BP 127/61  MAP (mmHg) 81  BP Location Right Arm  BP Method Automatic  Patient Position (if appropriate) Lying  Pulse Rate 73  Pulse Rate Source Monitor  ECG Heart Rate 76  Resp 16  Oxygen Therapy  SpO2 100 %  O2 Device Room Air  Patient Activity (if Appropriate) In bed  During Treatment Monitoring  Blood Flow Rate (mL/min) 0 mL/min  Arterial Pressure (mmHg) -1.01 mmHg  Venous Pressure (mmHg) -1.41 mmHg  TMP (mmHg) -50.1 mmHg  Ultrafiltration Rate (mL/min) 602 mL/min  Dialysate Flow Rate (mL/min) 0 ml/min  Duration of HD Treatment -hour(s) 3.5 hour(s)  Cumulative Fluid Removed (mL) per Treatment  1500.11  Intra-Hemodialysis Comments Tolerated well;Tx completed  Dialysis Fluid Bolus Normal Saline  Bolus Amount (mL) 300 mL  Post Treatment  Dialyzer Clearance Lightly streaked  Hemodialysis Intake (mL) 0 mL  Liters Processed 84  Fluid Removed (mL) 1500 mL  Tolerated HD Treatment Yes  Post-Hemodialysis Comments conscious axo x4  Hemodialysis Catheter Left Internal jugular Double lumen Permanent (Tunneled)  Placement Date/Time: 12/30/23 0831   Placed prior to admission: No  Serial / Lot #: [id number]  Expiration Date: 12/02/27  Time  Out: Correct patient;Correct site;Correct procedure  Maximum sterile barrier precautions: Hand hygiene;Large sterile sheet;C...  Site Condition No complications  Blue Lumen Status Flushed;Heparin  locked;Dead end cap in place  Red Lumen Status Flushed;Heparin  locked;Dead end cap in place  Purple Lumen Status N/A  Catheter fill solution Heparin  1000 units/ml  Catheter  fill volume (Arterial) 2.1 cc  Catheter fill volume (Venous) 2.1  Dressing Status Antimicrobial disc/dressing in place;Clean, Dry, Intact  Drainage Description None  Post treatment catheter status Capped and Clamped      Vivian E. Lagar, RN HD Nurse

## 2024-01-03 NOTE — Progress Notes (Signed)
 Physical Therapy Treatment Patient Details Name: Russell Austin MRN: [id number] DOB: 12-11-1950 Today's Date: 01/03/2024   History of Present Illness 73 y.o. male admitted 4/25 with Sepsis. PMH: Arthritis, Blindness, Chronic kidney disease, Diabetes mellitus type II, Diabetic retinopathy, ED, Glaucoma of both eyes, Hypertension, Left hydrocele, and Stroke.    PT Comments  Pt tolerates treatment well. Emphasis of session remains on transfer training as the pt continues to demonstrate a posterior lean and insufficient strength to obtain a standing position unassisted. PT also provides therapeutic exercise to improve trunk extension and PNF diagonals to aide in mobility of RUE and to reduce the frequency of spasms. Patient will benefit from continued inpatient follow up therapy, <3 hours/day.    If plan is discharge home, recommend the following: A lot of help with walking and/or transfers;A lot of help with bathing/dressing/bathroom;Assistance with cooking/housework;Direct supervision/assist for medications management;Direct supervision/assist for financial management;Assist for transportation;Help with stairs or ramp for entrance;Supervision due to cognitive status   Can travel by private vehicle     Yes  Equipment Recommendations  None recommended by PT    Recommendations for Other Services       Precautions / Restrictions Precautions Precautions: Fall Recall of Precautions/Restrictions: Intact Restrictions Weight Bearing Restrictions Per Provider Order: No     Mobility  Bed Mobility Overal bed mobility: Needs Assistance Bed Mobility: Supine to Sit, Sit to Supine     Supine to sit: Mod assist, Used rails Sit to supine: Mod assist        Transfers Overall transfer level: Needs assistance Equipment used: Rolling walker (2 wheels) Transfers: Sit to/from Stand, Bed to chair/wheelchair/BSC Sit to Stand: Min assist   Step pivot transfers: Min assist       General  transfer comment: pt with max verbal cueing for transfer setup and technique with emphasis on scooting to the edge of the seat, foot placement with knee flexion greater than 90 degrees, hand placement to better utilize LUE to push, and continued anterior weight shift. Pt stands 3 times during session with 2 step pivot transfers    Ambulation/Gait Ambulation/Gait assistance: Min assist Gait Distance (Feet): 4 Feet Assistive device: Rolling walker (2 wheels) Gait Pattern/deviations: Step-to pattern Gait velocity: reduced Gait velocity interpretation: <1.31 ft/sec, indicative of household ambulator   General Gait Details: slowed step-to gait, reduced stance time on RLE, increased effort to advance RLE due to chronic weakness from CVA   Stairs             Wheelchair Mobility     Tilt Bed    Modified Rankin (Stroke Patients Only)       Balance Overall balance assessment: Needs assistance Sitting-balance support: Feet supported, Single extremity supported Sitting balance-Leahy Scale: Poor Sitting balance - Comments: intermittent posterior lean requiring minA to correct Postural control: Posterior lean Standing balance support: Bilateral upper extremity supported Standing balance-Leahy Scale: Poor Standing balance comment: minA-CGA                            Communication Communication Communication: No apparent difficulties  Cognition Arousal: Alert Behavior During Therapy: Flat affect   PT - Cognitive impairments: History of cognitive impairments                       PT - Cognition Comments: slowed processing but otherwise appropriate Following commands: Intact Following commands impaired: Follows one step commands with increased time    Cueing  Cueing Techniques: Verbal cues, Tactile cues  Exercises Other Exercises Other Exercises: PNF D1 and D2 diagonals x 3 reps AROM Other Exercises: thoracic roll to increase thoracic extension combined  with active cervical extension    General Comments General comments (skin integrity, edema, etc.): VSS on RA      Pertinent Vitals/Pain Pain Assessment Pain Assessment: No/denies pain    Home Living                          Prior Function            PT Goals (current goals can now be found in the care plan section) Acute Rehab PT Goals Patient Stated Goal: Get stronger at rehab Progress towards PT goals: Progressing toward goals    Frequency    Min 2X/week      PT Plan      Co-evaluation              AM-PAC PT "6 Clicks" Mobility   Outcome Measure  Help needed turning from your back to your side while in a flat bed without using bedrails?: A Lot Help needed moving from lying on your back to sitting on the side of a flat bed without using bedrails?: A Lot Help needed moving to and from a bed to a chair (including a wheelchair)?: A Little Help needed standing up from a chair using your arms (e.g., wheelchair or bedside chair)?: A Little Help needed to walk in hospital room?: Total Help needed climbing 3-5 steps with a railing? : Total 6 Click Score: 12    End of Session Equipment Utilized During Treatment: Gait belt Activity Tolerance: Patient tolerated treatment well Patient left: in bed;with call bell/phone within reach;with family/visitor present Nurse Communication: Mobility status PT Visit Diagnosis: Unsteadiness on feet (R26.81);Muscle weakness (generalized) (M62.81);Repeated falls (R29.6);History of falling (Z91.81);Difficulty in walking, not elsewhere classified (R26.2);Other symptoms and signs involving the nervous system (R29.898);Hemiplegia and hemiparesis     Time: 1027-2536 PT Time Calculation (min) (ACUTE ONLY): 36 min  Charges:    $Therapeutic Exercise: 8-22 mins $Therapeutic Activity: 8-22 mins PT General Charges $$ ACUTE PT VISIT: 1 Visit                     Rexie Catena, PT, DPT Acute Rehabilitation Office  [phone]    Rexie Catena 01/03/2024, 2:50 PM

## 2024-01-03 NOTE — Progress Notes (Signed)
 [facility] Progress Note   Subjective:   Discharge planning ongoing. Needs to complete HD in a chair tomorrow. Denies SOB, CP, dizziness, nausea.   Objective Vitals:   01/03/24 0023 01/03/24 0103 01/03/24 0530 01/03/24 0843  BP: 127/61 130/62 122/62 135/63  Pulse: 73 73 71 71  Resp: 16 17 18 17   Temp: 98.9 F (37.2 C) 98.7 F (37.1 C) 98.4 F (36.9 C) 98.2 F (36.8 C)  TempSrc: Oral Oral Oral Oral  SpO2: 100% 100% 100% 100%  Weight:   70.6 kg   Height:       Physical Exam General: Alert male in NAD Heart: RRR, no murmurs, rubs or gallops  Lungs:CTA bilaterally, respirations unlabored Abdomen: Soft, non-distended, +BS Extremities: No edema b/l lower extremities Dialysis Access:  [facility] with intact bandage  Additional Objective Labs: Basic Metabolic Panel: Recent Labs  Lab 12/28/23 0229 12/29/23 0502 01/01/24 0649 01/01/24 0650 01/02/24 0942 01/03/24 0922  NA 143   < >  --  133* 134* 131*  K 3.0*   < >  --  3.9 4.0 4.2  CL 97*   < >  --  100 101 97*  CO2 34*   < >  --  24 22 22   GLUCOSE 194*   < >  --  204* 220* 311*  BUN 26*   < >  --  28* 35* 20  CREATININE 2.44*   < >  --  2.48* 2.96* 2.43*  CALCIUM  7.7*   < >  --  7.4* 8.0* 7.7*  PHOS 2.1*  --  2.3*  --  2.9  --    < > = values in this interval not displayed.   Liver Function Tests: Recent Labs  Lab 12/28/23 0229 12/30/23 0438 01/02/24 0942  AST  --  22 52*  ALT  --  19 26  ALKPHOS  --  89 86  BILITOT  --  0.9 0.8  PROT  --  6.3* 6.3*  ALBUMIN  1.8* 1.8* 1.8*   No results for input(s): "LIPASE", "AMYLASE" in the last 168 hours. CBC: Recent Labs  Lab 12/30/23 0438 12/31/23 0608 01/01/24 0650 01/02/24 0942 01/03/24 0922  WBC 7.3 7.8 8.7 8.0 7.3  NEUTROABS 5.2 5.3 6.1 4.7 4.5  HGB 7.2* 6.9* 8.6* 9.0* 9.1*  HCT 23.6* 21.3* 26.2* 28.3* 27.9*  MCV 83.1 81.0 83.2 86.0 85.3  PLT 333 292 296 283 265   Blood Culture    Component Value Date/Time   SDES BLOOD BLOOD LEFT ARM  12/27/2023 1922   SPECREQUEST  12/27/2023 1922    BOTTLES DRAWN AEROBIC AND ANAEROBIC Blood Culture adequate volume   CULT  12/27/2023 1922    NO GROWTH 5 DAYS Performed at [facility], [address], [place], [place] [address]    REPTSTATUS 01/01/2024 FINAL 12/27/2023 1922    Cardiac Enzymes: No results for input(s): "CKTOTAL", "CKMB", "CKMBINDEX", "TROPONINI" in the last 168 hours. CBG: Recent Labs  Lab 01/02/24 0829 01/02/24 1145 01/02/24 1641 01/03/24 0101 01/03/24 0841  GLUCAP 188* 188* 184* 171* 286*   Iron Studies: No results for input(s): "IRON", "TIBC", "TRANSFERRIN", "FERRITIN" in the last 72 hours. @lablastinr3 @ Studies/Results: No results found. Medications:  ceFEPime  (MAXIPIME ) IV Stopped (01/03/24 6045)   vancomycin  Stopped (01/02/24 2351)    amLODipine   5 mg Oral Daily   atorvastatin   10 mg Oral q morning   buPROPion   300 mg Oral q morning   calcitRIOL   0.5 mcg Oral Daily  Chlorhexidine  Gluconate Cloth  6 each Topical Q0600   darbepoetin (ARANESP ) injection - NON-DIALYSIS  40 mcg Subcutaneous Q Tue-1800   gabapentin   300 mg Oral QHS   gentamicin  cream  1 Application Topical Daily   heparin  injection (subcutaneous)  5,000 Units Subcutaneous Q8H   insulin  aspart  0-20 Units Subcutaneous TID WC   insulin  aspart  2 Units Subcutaneous TID WC   insulin  glargine-yfgn  22 Units Subcutaneous QHS   losartan   100 mg Oral Daily   metoprolol  tartrate  50 mg Oral BID   pantoprazole   40 mg Oral Daily   potassium chloride   20 mEq Oral Daily   sodium chloride  flush  10-40 mL Intracatheter Q12H   sodium chloride  flush  3 mL Intravenous Q12H    Dialysis Orders:prior PD patient of Dr. Kennith Peak   Assessment/Plan: Recurrent peritonitis status post PD catheter removed 12/30/23 transitioning  to hemodialysis.  On IV antibiotics per ID,  vancomycin  750 mg  and  2 gm cefepime  post hd for a total of 3 weeks, stop date 01/16/24.  ESRD - HD TTS. 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 Continue weekly aranesp . Holding IV Fe for now with ongoing infection Secondary hyperparathyroidism -corrected calcium  ok, phos at goal Hypokalemia- currently at goal on potassium chloride  40mEq daily HTN/volume -BP currently stable, continue current medications. Amlodipine  was reduced to 5mg  to prevent hypotension on HD. Appears euvolemic on exam.   Ramona Burner, PA-C 01/03/2024, 11:18 AM  [facility] Pager: [phone]

## 2024-01-03 NOTE — Progress Notes (Signed)
Pt is back on the unit from dialysis

## 2024-01-03 NOTE — Progress Notes (Signed)
 Advised by CSW that pt should d/c to snf tomorrow after HD. SNF is requesting pt receive HD in the chair tomorrow prior to d/c. Contacted nephrologist and renal PA so they can be aware of this request and to order HD in the chair for tomorrow. Contacted inpt HD staff to request 1st shift tomorrow if possible and to be advised pt will need HD in chair. Met with pt and pt's wife at bedside. Introduced self and explained role. Discussed pt's out-pt HD arrangements. Both agreeable to plan and schedule letter provided. HD arrangements added to pt's AVS. Contacted [facility] and spoke to charge RN who confirms clinic has iv abx needed for pt at d/c. Renal PA to send orders to clinic at d/c. Clinic advised of plans for pt to d/c tomorrow after HD and should start on Tuesday. Will assist as needed.   Lauraine Polite Renal Navigator [phone]

## 2024-01-04 DIAGNOSIS — K659 Peritonitis, unspecified: Secondary | ICD-10-CM | POA: Diagnosis not present

## 2024-01-04 DIAGNOSIS — D649 Anemia, unspecified: Secondary | ICD-10-CM | POA: Diagnosis not present

## 2024-01-04 DIAGNOSIS — N186 End stage renal disease: Secondary | ICD-10-CM | POA: Diagnosis not present

## 2024-01-04 DIAGNOSIS — I160 Hypertensive urgency: Secondary | ICD-10-CM | POA: Diagnosis not present

## 2024-01-04 LAB — HEPATITIS PANEL, ACUTE
HCV Ab: NONREACTIVE
Hep A IgM: NONREACTIVE
Hep B C IgM: NONREACTIVE
Hepatitis B Surface Ag: NONREACTIVE

## 2024-01-04 LAB — COMPREHENSIVE METABOLIC PANEL WITH GFR
ALT: 79 U/L — ABNORMAL HIGH (ref 0–44)
AST: 79 U/L — ABNORMAL HIGH (ref 15–41)
Albumin: 1.7 g/dL — ABNORMAL LOW (ref 3.5–5.0)
Alkaline Phosphatase: 96 U/L (ref 38–126)
Anion gap: 9 (ref 5–15)
BUN: 34 mg/dL — ABNORMAL HIGH (ref 8–23)
CO2: 24 mmol/L (ref 22–32)
Calcium: 8 mg/dL — ABNORMAL LOW (ref 8.9–10.3)
Chloride: 99 mmol/L (ref 98–111)
Creatinine, Ser: 3.65 mg/dL — ABNORMAL HIGH (ref 0.61–1.24)
GFR, Estimated: 17 mL/min — ABNORMAL LOW (ref >60)
Glucose, Bld: 189 mg/dL — ABNORMAL HIGH (ref 70–99)
Potassium: 4.3 mmol/L (ref 3.5–5.1)
Sodium: 132 mmol/L — ABNORMAL LOW (ref 135–145)
Total Bilirubin: 0.7 mg/dL (ref 0.0–1.2)
Total Protein: 6.2 g/dL — ABNORMAL LOW (ref 6.5–8.1)

## 2024-01-04 LAB — CBC WITH DIFFERENTIAL/PLATELET
Abs Immature Granulocytes: 0.03 10*3/uL (ref 0.00–0.07)
Basophils Absolute: 0.1 10*3/uL (ref 0.0–0.1)
Basophils Relative: 1 %
Eosinophils Absolute: 0.8 10*3/uL — ABNORMAL HIGH (ref 0.0–0.5)
Eosinophils Relative: 10 %
HCT: 26.5 % — ABNORMAL LOW (ref 39.0–52.0)
Hemoglobin: 8.3 g/dL — ABNORMAL LOW (ref 13.0–17.0)
Immature Granulocytes: 0 %
Lymphocytes Relative: 17 %
Lymphs Abs: 1.3 10*3/uL (ref 0.7–4.0)
MCH: 27.3 pg (ref 26.0–34.0)
MCHC: 31.3 g/dL (ref 30.0–36.0)
MCV: 87.2 fL (ref 80.0–100.0)
Monocytes Absolute: 1 10*3/uL (ref 0.1–1.0)
Monocytes Relative: 13 %
Neutro Abs: 4.7 10*3/uL (ref 1.7–7.7)
Neutrophils Relative %: 59 %
Platelets: 281 10*3/uL (ref 150–400)
RBC: 3.04 MIL/uL — ABNORMAL LOW (ref 4.22–5.81)
RDW: 19.9 % — ABNORMAL HIGH (ref 11.5–15.5)
WBC: 7.8 10*3/uL (ref 4.0–10.5)
nRBC: 0 % (ref 0.0–0.2)

## 2024-01-04 LAB — GLUCOSE, CAPILLARY
Glucose-Capillary: 163 mg/dL — ABNORMAL HIGH (ref 70–99)
Glucose-Capillary: 78 mg/dL (ref 70–99)
Glucose-Capillary: 169 mg/dL — ABNORMAL HIGH (ref 70–99)
Glucose-Capillary: 178 mg/dL — ABNORMAL HIGH (ref 70–99)

## 2024-01-04 LAB — PHOSPHORUS: Phosphorus: 3 mg/dL (ref 2.5–4.6)

## 2024-01-04 LAB — MAGNESIUM: Magnesium: 1.8 mg/dL (ref 1.7–2.4)

## 2024-01-04 MED ORDER — ONDANSETRON HCL 4 MG PO TABS: 4.0000 mg | ORAL_TABLET | Freq: Four times a day (QID) | ORAL | 0 refills | Status: DC | PRN

## 2024-01-04 MED ORDER — ALBUTEROL SULFATE (2.5 MG/3ML) 0.083% IN NEBU: 2.5000 mg | INHALATION_SOLUTION | Freq: Four times a day (QID) | RESPIRATORY_TRACT | 12 refills | Status: DC | PRN

## 2024-01-04 MED ORDER — CALCITRIOL 0.5 MCG PO CAPS: 0.5000 ug | ORAL_CAPSULE | Freq: Every day | ORAL | Status: DC

## 2024-01-04 MED ORDER — ACETAMINOPHEN 325 MG PO TABS: 650.0000 mg | ORAL_TABLET | Freq: Four times a day (QID) | ORAL | 0 refills | Status: DC | PRN

## 2024-01-04 NOTE — Progress Notes (Signed)
   01/04/24 1300  Vitals  Pulse Rate 77  Resp 14  BP 128/60  SpO2 100 %  O2 Device Room Air  Oxygen Therapy  Patient Activity (if Appropriate) In chair  Pulse Oximetry Type Continuous  Oximetry Probe Site Changed No  During Treatment Monitoring  Blood Flow Rate (mL/min) 349 mL/min  Arterial Pressure (mmHg) -197.36 mmHg  Venous Pressure (mmHg) 147.27 mmHg  TMP (mmHg) -21.61 mmHg  Ultrafiltration Rate (mL/min) 0 mL/min  Dialysate Flow Rate (mL/min) 100 ml/min  Dialysate Potassium Concentration 3  Dialysate Calcium  Concentration 2.5  Duration of HD Treatment -hour(s) 3 hour(s)  Cumulative Fluid Removed (mL) per Treatment  201.16  HD Safety Checks Performed Yes  Intra-Hemodialysis Comments Tx completed   Received patient in bed to unit.  Alert and oriented.  Informed consent signed and in chart.   TX duration: 3  Patient tolerated well.  Transported back to the room  Alert, without acute distress.  Hand-off given to patient's nurse.   Access used: Left Dialysis Catheter Access issues: None  Total UF removed: 0.2 Medication(s) given: None    Deann Exon, LPN  [facility]

## 2024-01-04 NOTE — Progress Notes (Signed)
 [facility] Progress Note   Subjective:   Pt seen prior to HD, plan to do dialysis In chair today to make sure he tolerates prior to discharge. He denies SOB, CP, dizziness, nausea.   Objective Vitals:   01/04/24 0402 01/04/24 0746 01/04/24 0929 01/04/24 0953  BP: (!) 120/57 126/61 (!) 142/68 137/66  Pulse: 78 76 72 69  Resp: 17 16 15 15   Temp: 99 F (37.2 C) 98.4 F (36.9 C) 98.4 F (36.9 C)   TempSrc: Oral Oral Oral   SpO2: 100% 100% 100% 100%  Weight:      Height:       Physical Exam General: Alert male in NAD Heart: RRR, no murmurs, rubs or gallops  Lungs:CTA bilaterally, respirations unlabored Abdomen: Soft, non-distended, +BS Extremities: No edema b/l lower extremities Dialysis Access:  [facility] with intact bandage  Additional Objective Labs: Basic Metabolic Panel: Recent Labs  Lab 01/01/24 0649 01/01/24 0650 01/02/24 0942 01/03/24 0922  NA  --  133* 134* 131*  K  --  3.9 4.0 4.2  CL  --  100 101 97*  CO2  --  24 22 22   GLUCOSE  --  204* 220* 311*  BUN  --  28* 35* 20  CREATININE  --  2.48* 2.96* 2.43*  CALCIUM   --  7.4* 8.0* 7.7*  PHOS 2.3*  --  2.9  --    Liver Function Tests: Recent Labs  Lab 12/30/23 0438 01/02/24 0942  AST 22 52*  ALT 19 26  ALKPHOS 89 86  BILITOT 0.9 0.8  PROT 6.3* 6.3*  ALBUMIN  1.8* 1.8*   No results for input(s): "LIPASE", "AMYLASE" in the last 168 hours. CBC: Recent Labs  Lab 12/30/23 0438 12/31/23 0608 01/01/24 0650 01/02/24 0942 01/03/24 0922  WBC 7.3 7.8 8.7 8.0 7.3  NEUTROABS 5.2 5.3 6.1 4.7 4.5  HGB 7.2* 6.9* 8.6* 9.0* 9.1*  HCT 23.6* 21.3* 26.2* 28.3* 27.9*  MCV 83.1 81.0 83.2 86.0 85.3  PLT 333 292 296 283 265   Blood Culture    Component Value Date/Time   SDES BLOOD BLOOD LEFT ARM 12/27/2023 1922   SPECREQUEST  12/27/2023 1922    BOTTLES DRAWN AEROBIC AND ANAEROBIC Blood Culture adequate volume   CULT  12/27/2023 1922    NO GROWTH 5 DAYS Performed at [facility], [address], [place], [place] [address]    REPTSTATUS 01/01/2024 FINAL 12/27/2023 1922    Cardiac Enzymes: No results for input(s): "CKTOTAL", "CKMB", "CKMBINDEX", "TROPONINI" in the last 168 hours. CBG: Recent Labs  Lab 01/03/24 0841 01/03/24 1158 01/03/24 1700 01/03/24 2141 01/04/24 0742  GLUCAP 286* 302* 151* 279* 163*   Iron Studies: No results for input(s): "IRON", "TIBC", "TRANSFERRIN", "FERRITIN" in the last 72 hours. @lablastinr3 @ Studies/Results: No results found. Medications:  ceFEPime  (MAXIPIME ) IV 1 g (01/03/24 2324)   vancomycin  Stopped (01/02/24 2351)    amLODipine   5 mg Oral Daily   atorvastatin   10 mg Oral q morning   buPROPion   300 mg Oral q morning   calcitRIOL   0.5 mcg Oral Daily   Chlorhexidine  Gluconate Cloth  6 each Topical Q0600   darbepoetin (ARANESP ) injection - NON-DIALYSIS  40 mcg Subcutaneous Q Tue-1800   gabapentin   300 mg Oral QHS   gentamicin  cream  1 Application Topical Daily   heparin  injection (subcutaneous)  5,000 Units Subcutaneous Q8H   insulin  aspart  0-20 Units Subcutaneous TID WC   insulin  aspart  2 Units Subcutaneous TID  WC   insulin  glargine-yfgn  22 Units Subcutaneous QHS   losartan   100 mg Oral Daily   metoprolol  tartrate  50 mg Oral BID   pantoprazole   40 mg Oral Daily   potassium chloride   20 mEq Oral Daily   sodium chloride  flush  10-40 mL Intracatheter Q12H   sodium chloride  flush  3 mL Intravenous Q12H    Dialysis Orders: prior PD patient of Dr. Kennith Peak     Assessment/Plan: Recurrent peritonitis status post PD catheter removed 12/30/23 transitioning  to hemodialysis.  On IV antibiotics per ID,  vancomycin  750 mg  and  2 gm cefepime  post hd for a total of 3 weeks, stop date 01/16/24.  ESRD - HD TTS. Dr Cindra Cree ordered vein mapping, eventually will need graft versus fistula Anemia of ESRD-Hgb 9.1, s/p 1 unit PRBC. Continue weekly aranesp . Holding IV Fe for now with ongoing infection Secondary hyperparathyroidism  -corrected calcium  ok, phos at goal Hypokalemia- currently at goal on potassium chloride  40mEq daily HTN/volume -BP currently stable, continue current medications. Amlodipine  was reduced to 5mg  to prevent hypotension on HD. Appears euvolemic on exam.   Ramona Burner, PA-C 01/04/2024, 10:03 AM  [facility] Pager: [phone]

## 2024-01-04 NOTE — Plan of Care (Signed)
  Problem: Education: Goal: Ability to describe self-care measures that may prevent or decrease complications (Diabetes Survival Skills Education) will improve Outcome: Progressing   Problem: Coping: Goal: Ability to adjust to condition or change in health will improve Outcome: Progressing   Problem: Fluid Volume: Goal: Ability to maintain a balanced intake and output will improve Outcome: Progressing   Problem: Metabolic: Goal: Ability to maintain appropriate glucose levels will improve Outcome: Progressing   Problem: Nutritional: Goal: Maintenance of adequate nutrition will improve Outcome: Progressing   Problem: Skin Integrity: Goal: Risk for impaired skin integrity will decrease Outcome: Progressing   

## 2024-01-04 NOTE — Discharge Summary (Signed)
 Physician Discharge Summary   Patient: Russell Austin MRN: [id number] DOB: 1950/11/28  Admit date:     12/27/2023  Discharge date: 01/04/24  Discharge Physician: Aura Leeds, DO   PCP: Alto Atta, NP   Recommendations at discharge:   Follow-up with PCP within 1 to 2 weeks repeat CBC, CMP, mag, Phos within 1 week Follow-up with Nephrology in outpatient setting and continue hemodialysis Tuesday Thursday Saturday and continue antibiotics with dialysis for 3 weeks per ID  Discharge Diagnoses: Principal Problem:   Peritonitis (HCC) Active Problems:   ESRD on dialysis [facility])   Hypertensive urgency   Normocytic anemia   Controlled diabetes mellitus with arthropathy, with long-term current use of insulin  (HCC)   Hyperlipidemia   Legally blind  Resolved Problems:   * No resolved hospital problems. [facility] Course: The patient is a 73 year old African-American male with a past medical history significant for but not limited to diabetes mellitus type 2 with retinopathy and glaucoma with right eye visual loss, prior lower brainstem infarct in 2016 needing a core track and CIR evaluation, hyperlipidemia, ESRD who is now previously on PD but having Enterococcus faecalis peritonitis who was directed to come to the ED on 12/27/2023 by nephrology after his PD fluid check showed milligram negative cocci bacilli, rare gram-positive cocci in pairs and clusters with resistance to ampicillin, cefazolin .  He was CT of the abdomen pelvis which showed loculated peritoneal fluid collections in the ventral abdomen tracking along the right liver with subscapular involvement given degree of right liver scalloping and small volume pleural fluid without ectasis more on the right and left base.  Nephrology evaluated and vascular surgery was consulted and plan is for permanent access and he had a temporary trialysis catheter placed but had a complication of bleeding from the left jugular catheter site and had  to be given desmopressin  x 2.  On 12/22/2023 his PD catheter was removed.  Patient's sepsis physiology has resolved and ID had to be consulted and they are recommending vancomycin  and cefepime  post HD for total 3 weeks.  Currently patient has been accepted at [facility] on TThSat 11:30 chair time. The SNF could not accept him until Saturday however his bed was given away and will need to continually check on bed availability. He remains medically stable for D/C at this time.   Assessment and Plan:  Sepsis 2/2 Polymicrobial Dialysate : Not bacteremic--removed PD cath;  Gram-negative coccobacilli/gram-positive cocci pair/cluster on 4/15--growing Enterococcus 4/25--IV cefepime /vancomycin  for Duration 3 weeks per ID with end date of 01/16/24. BC x2 negative from 4/25. Sepsis physiology resolved currently and he is stable   Anemia Blood loss expected secondary to bleeding from left trialysis jugular site superimposed on Anemia of Chronic Renal Disease: Temp cath placed for iHD; s/p Desmopressin  X2 per IR on 4/25. Hemoglobin below 7 on 4 /29--no acute bleed-suspect lab variation transfused with HD 1 unit PRBC. Hgb/Hct Trend was fairly stable but now did drop from 9.1/27.9 -> 8.3/26.5; Continue Holding aspirin  81 at this time for now--- resume as outpatient once all bleeding risk over   ESRD now on HD instead of PD / Hyperparathyroid: BUN/Cr now 34/3.65; Trialysis catheter placed 4/25-dialysis through this; not taking calcitriol  0.25 daily but reordered -Phos Level was now 3.0 and Calcium  was 8.0 -Requires outpatient set up with HD and placement with skilled facility at discharge and facility cannot take him until Saturday per [facility] however now the patient's bed has been given away for unclear when  he will be able to be discharged.  Per Nephrology social worker patient will need to complete issue in a chair tomorrow and so this is being arranged. -Avoid Nephrotoxic Medications, Contrast Dyes, Hypotension and  Dehydration to Ensure Adequate Renal Perfusion and will need to Renally Adjust Meds. CTM and Trend Renal Function carefully and repeat CMP in the AM   Hyponatremia: Mild. Na+ 133 -> 134 -> 131 -> 132. CTM and Trend and repeat CMP in the AM. Likely to be further corrected in Dialysis  Hypomagnesemia: Mag Level was 1.8 on last check. Replete with IV Mag Sulfate 2 grams. CTM and replete as Necessary. Repeat Mag Level in the AM  Essential HTN: Nephrology change meds to Amlodipine  5 mg po daily; Losartan  100 mg po Daily and Metoprolol  Tartrate 50 mg po twice daily. CTM BP per Protocol. Last BP reading was 124/63  HLD: Hold Atorvastatin  10 mg po Daily given abnormal LFTs   Mildly elevated LFT/Transaminitis: Likely 2/2 infection; Current Trend shows AST went from 45 -> 22 -> 52 -> 79. ALT went from 38 -> 19 -> 26 -> 79. CTM and Trend Hepatic Fxn carefully; Repeat CMP in the AM and will obtain RUQ U/S and Acute Hepatitis Panel  Thrombocytosis: Improved and resolved. 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 status; C/w Semglee  18 units sq at bedtime, Novolog  2 units 3 times daily meals, Moderate sliding scale increased to Resistant Scale; Continue Holding Glucotrol  XL 5: CBG ranging from 171-302 with intermittent lows which are improved. C/w Gabapentin  300  mg po qHS   Previous CVA in 2016: Dense R side deficit--using wc for the past 3 mo and debilitated; PT/OT now recommending SNF; Hold Atorvastatin  10 mg po daily given abnormal LFTs  GERD/GI Prophylaxis: C/w PPI with Pantoprazole  40 mg po Daily   Hypoalbuminemia: Patient's Albumin  is now 1.7. CTM and Trend and repeat CMP in the AM.  Consultants:  Nephrology Infectious Diseases  Vascular Surgery Interventional Radiology  Procedures performed: As  delineated as above   Disposition: Skilled nursing facility  Diet recommendation:  Renal diet DISCHARGE MEDICATION: Allergies as of 01/04/2024       Reactions   Lactose Intolerance (gi) Diarrhea   Apple Pectin [pectin] Itching   ITCHY THROAT   Metformin  And Related Other (See Comments)   D/t decreased kidney function    Peach [prunus Persica] Itching   ITCHY THROAT   Morphine  And Codeine Anxiety   Jittery        Medication List     PAUSE taking these medications    atorvastatin  10 MG tablet Wait to take this until your doctor or other care provider tells you to start again. Commonly known as: LIPITOR Take 1 tablet (10 mg total) by mouth every morning.       STOP taking these medications    chlorthalidone 25 MG tablet Commonly known as: HYGROTON   glipiZIDE  5 MG 24 hr tablet Commonly known as: GLUCOTROL  XL   torsemide  20 MG tablet Commonly known as: DEMADEX        TAKE these medications    acetaminophen  325 MG tablet Commonly known as: TYLENOL  Take 2 tablets (650 mg total) by mouth every 6 (six) hours as needed for mild pain (pain score 1-3) (or Fever >/=  101).   albuterol  (2.5 MG/3ML) 0.083% nebulizer solution Commonly known as: PROVENTIL  Take 3 mLs (2.5 mg total) by nebulization every 6 (six) hours as needed for wheezing or shortness of breath.   amLODipine  5 MG tablet Commonly known as: NORVASC  Take 1 tablet (5 mg total) by mouth daily.   aspirin  EC 81 MG tablet Take 81 mg by mouth daily.   B-D ULTRAFINE III SHORT PEN 31G X 8 MM Misc Generic drug: Insulin  Pen Needle Use to inject insulin  4 times per day as instructed   buPROPion  300 MG 24 hr tablet Commonly known as: WELLBUTRIN  XL TAKE 1 TABLET (300 MG TOTAL) BY MOUTH EVERY MORNING.   calcitRIOL  0.5 MCG capsule Commonly known as: ROCALTROL  Take 1 capsule (0.5 mcg total) by mouth daily. What changed:  medication strength how much to take   ceFEPime  IVPB Commonly known as:  MAXIPIME  Inject 2 g into the vein every dialysis for up to 14 days (MWF or TTS). Indication:  Polymicrobial peritonitis Last Day of Therapy:  01/16/24 Labs - Once weekly:  CBC/D and BMP, ESR and CRP Method of administration: Per hemodialysis protocol - to be given at HD center   chlorproMAZINE  25 MG tablet Commonly known as: THORAZINE  TAKE 1 TABLET BY MOUTH THREE TIMES A DAY What changed:  when to take this reasons to take this   FreeStyle Libre 3 Plus Sensor Misc Change sensor every 15 days.   gabapentin  300 MG capsule Commonly known as: NEURONTIN  TAKE ONE CAPSULE BY MOUTH EVERYDAY AT BEDTIME   gentamicin  cream 0.1 % Commonly known as: GARAMYCIN  Apply 1 Application topically daily.   lactulose 10 GM/15ML solution Commonly known as: CHRONULAC Take by mouth daily as needed for mild constipation or moderate constipation.   Lantus  SoloStar 100 UNIT/ML Solostar Pen Generic drug: insulin  glargine INJECT 20 UNITS INTO THE SKIN DAILY What changed:  how much to take when to take this   losartan  100 MG tablet Commonly known as: COZAAR  TAKE 1 TABLET BY MOUTH EVERY DAY IN THE MORNING   metoCLOPramide  10 MG tablet Commonly known as: REGLAN  TAKE 1 TABLET BY MOUTH 4 TIMES DAILY.   metoprolol  tartrate 50 MG tablet Commonly known as: LOPRESSOR  Take 50 mg by mouth 2 (two) times daily.   MIRCERA IJ Inject into the skin.   NovoLOG  FlexPen 100 UNIT/ML FlexPen Generic drug: insulin  aspart Inject 5 Units into the skin 3 (three) times daily with meals. What changed:  how much to take when to take this   ondansetron  4 MG tablet Commonly known as: ZOFRAN  Take 1 tablet (4 mg total) by mouth every 6 (six) hours as needed for nausea.   pantoprazole  40 MG tablet Commonly known as: PROTONIX  TAKE 1 TABLET BY MOUTH EVERY DAY   vancomycin  IVPB Inject 750 mg into the vein every dialysis for up to 14 days (MWF or TTS). Indication:  Polymicrobial peritonitis Last Day of Therapy:   01/16/24 Labs - Once weekly:  CBC/D and BMP, weekly pre-HD Vanc level (goal 15-25) Method of administration: Per hemodialysis protocol - to be given at HD center               Home Infusion Instuctions  (From admission, onward)           Start     Ordered   01/01/24 0000  Home infusion instructions       Comments: To be given at the hemodialysis center  Question:  Instructions  Answer:  Flushing of vascular  access device: 0.9% NaCl pre/post medication administration and prn patency; Heparin  100 u/ml, 5ml for implanted ports and Heparin  10u/ml, 5ml for all other central venous catheters.   01/01/24 1310            Follow-up Information     Center, [facility]. Go on 01/07/2024.   Why: Schedule is Tuesday, Thursday, Saturday with 11:30 am start time.  On Tuesday (5/6), please arrive at 10:30 am to complete paperwork prior to treatment. Contact information: [address] [place] [address] [phone]                Discharge Exam: Cleavon Curls Weights   12/31/23 1202 01/02/24 0500 01/03/24 0530  Weight: 70.6 kg 72.1 kg 70.6 kg   Vitals:   01/04/24 1305 01/04/24 1335  BP: 134/65 124/63  Pulse: 76 73  Resp: 14 14  Temp: 97.6 F (36.4 C)   SpO2: 100% 100%   Examination: Physical Exam:  Constitutional: WN/WD elderly chronically ill-appearing African-American male in no acute distress sitting in the chair in dialysis Respiratory: Diminished to auscultation bilaterally with some coarse breath sounds, no wheezing, rales, rhonchi or crackles. Normal respiratory effort and patient is not tachypenic. No accessory muscle use.  Unlabored breathing Cardiovascular: RRR, no murmurs / rubs / gallops. S1 and S2 auscultated. No extremity edema.  Abdomen: Soft, non-tender, non-distended. Bowel sounds positive.  GU: Deferred. Musculoskeletal: No clubbing / cyanosis of digits/nails. No joint deformity upper and lower extremities.  Skin: No rashes, lesions,  ulcers on limited skin evaluation. No induration; Warm and dry.  Neurologic: Has some blindness and dense sided right-sided deficits but other cranial nerves II through XII grossly intact Psychiatric: Normal judgment and insight. Alert and oriented x 3.   Condition at discharge: stable  The results of significant diagnostics from this hospitalization (including imaging, microbiology, ancillary and laboratory) are listed below for reference.   Imaging Studies: DG C-Arm 1-60 Min Result Date: 12/30/2023 CLINICAL DATA:  Dialysis catheter exchange EXAM: DG C-ARM 1-60 MIN FLUOROSCOPY: Fluoroscopy Time:  29 seconds Radiation Exposure Index (if provided by the fluoroscopic device): 4.57 mGy Number of Acquired Spot Images: 0 COMPARISON:  None Available. FINDINGS: Two intraoperative spot images demonstrate left dialysis catheter. The tip is in the right atrium. IMPRESSION: As above. Electronically Signed   By: Janeece Mechanic M.D.   On: 12/30/2023 13:05   VAS US  UPPER EXT VEIN MAPPING (PRE-OP  AVF) Result Date: 12/29/2023 UPPER EXTREMITY VEIN MAPPING Patient Name:  Kaven A Bonsall  Date of Exam:   12/29/2023 Medical Rec #: [id number]            Accession #:    [id number] Date of Birth: 12/10/1950           Patient Gender: M Patient Age:   4 years Exam Location:  [facility] Procedure:      VAS US  UPPER EXT VEIN MAPPING (PRE-OP  AVF) Referring Phys: Hilario Lover PEEPLES --------------------------------------------------------------------------------  Indications: Pre-access. History: ESRD, failed peritoneal dialysis secondary to infection. CVA 2016, now          with residual right hemiparesis.  Limitations: Right arm hemiparesis, IV, bandages, tissue properties, edema Comparison Study: No prior mapping on file Performing Technologist: Carleene Chase RVS  Examination Guidelines: A complete evaluation includes B-mode imaging, spectral Doppler, color Doppler, and power Doppler as needed of all accessible portions of  each vessel. Bilateral testing is considered an integral part of a complete examination. Limited examinations for reoccurring indications may be performed as  noted. +-----------------+-------------+----------+--------------+ Right Cephalic   Diameter (cm)Depth (cm)   Findings    +-----------------+-------------+----------+--------------+ Prox upper arm       0.16        0.40                  +-----------------+-------------+----------+--------------+ Mid upper arm        0.12        0.38                  +-----------------+-------------+----------+--------------+ Dist upper arm       0.11        0.42                  +-----------------+-------------+----------+--------------+ Antecubital fossa                       not visualized +-----------------+-------------+----------+--------------+ Prox forearm                            not visualized +-----------------+-------------+----------+--------------+ Mid forearm                             not visualized +-----------------+-------------+----------+--------------+ Dist forearm                            not visualized +-----------------+-------------+----------+--------------+ Wrist                                   not visualized +-----------------+-------------+----------+--------------+ +-----------------+-------------+----------+--------------+ Right Basilic    Diameter (cm)Depth (cm)   Findings    +-----------------+-------------+----------+--------------+ Prox upper arm                          not visualized +-----------------+-------------+----------+--------------+ Mid upper arm                           not visualized +-----------------+-------------+----------+--------------+ Dist upper arm                          not visualized +-----------------+-------------+----------+--------------+ Antecubital fossa    0.25        0.85                   +-----------------+-------------+----------+--------------+ Prox forearm         0.18        1.10                  +-----------------+-------------+----------+--------------+ Mid forearm                             not visualized +-----------------+-------------+----------+--------------+ Distal forearm                          not visualized +-----------------+-------------+----------+--------------+ Wrist                                   not visualized +-----------------+-------------+----------+--------------+ +-----------------+-------------+----------+---------------------+ Left Cephalic    Diameter (cm)Depth (cm)      Findings        +-----------------+-------------+----------+---------------------+ Prox upper arm  not visualized     +-----------------+-------------+----------+---------------------+ Mid upper arm                              not visualized     +-----------------+-------------+----------+---------------------+ Dist upper arm                             not visualized     +-----------------+-------------+----------+---------------------+ Antecubital fossa    0.32        0.17                         +-----------------+-------------+----------+---------------------+ Prox forearm         0.18        0.29                         +-----------------+-------------+----------+---------------------+ Mid forearm          0.20        0.25                         +-----------------+-------------+----------+---------------------+ Dist forearm         0.26        0.22                         +-----------------+-------------+----------+---------------------+ Wrist                                   not visualized and IV +-----------------+-------------+----------+---------------------+ +-----------------+-------------+----------+---------------------+ Left Basilic     Diameter (cm)Depth (cm)      Findings         +-----------------+-------------+----------+---------------------+ Prox upper arm                             not visualized     +-----------------+-------------+----------+---------------------+ Mid upper arm      0.26/0.10  1.25/0.79                       +-----------------+-------------+----------+---------------------+ Dist upper arm       0.33        0.81         branching       +-----------------+-------------+----------+---------------------+ Antecubital fossa    0.29        0.64                         +-----------------+-------------+----------+---------------------+ Prox forearm         0.24        0.85                         +-----------------+-------------+----------+---------------------+ Mid forearm          0.24        1.39                         +-----------------+-------------+----------+---------------------+ Distal forearm                          not visualized and IV +-----------------+-------------+----------+---------------------+ Wrist  not visualized and IV +-----------------+-------------+----------+---------------------+ *See table(s) above for measurements and observations.  Diagnosing physician: Jimmye Moulds MD Electronically signed by Jimmye Moulds MD on 12/29/2023 at 6:16:56 PM.    Final    IR Fluoro Guide CV Line Left Result Date: 12/27/2023 INDICATION: Acute renal failure, no current access for dialysis EXAM: ULTRASOUND FLUOROSCOPIC TEMPORARY LEFT IJ DIALYSIS CATHETER MEDICATIONS: 1% LIDOCAINE  LOCAL ANESTHESIA/SEDATION: Total intra-service moderate Sedation Time: None. The patient's level of consciousness and vital signs were monitored continuously by radiology nursing throughout the procedure under my direct supervision. FLUOROSCOPY: Radiation Exposure Index (as provided by the fluoroscopic device): 2.0 mGy Kerma COMPLICATIONS: None immediate. PROCEDURE: Informed written consent was obtained from  the patient after a thorough discussion of the procedural risks, benefits and alternatives. All questions were addressed. Maximal Sterile Barrier Technique was utilized including caps, mask, sterile gowns, sterile gloves, sterile drape, hand hygiene and skin antiseptic. A timeout was performed prior to the initiation of the procedure. Under sterile conditions and local anesthesia, left IJ micropuncture access performed. Images obtained for documentation of the patent left IJV. 018 guidewire advanced centrally followed by the micro dilator set. Measurements obtained for the appropriate length. Amplatz guidewire inserted. Tract dilatation performed to insert a 20 cm Mahurkar type temporary dialysis catheter. Tip position SVC RA junction. Catheter secured with silk sutures and a sterile dressing. Blood aspirated easily followed by saline and heparin  flushes. External caps applied. No immediate complication. Patient tolerated the procedure well. IMPRESSION: Successful ultrasound and fluoroscopic left IJ temporary dialysis catheter. Tip SVC RA junction. Ready for use. Electronically Signed   By: Melven Stable.  Shick M.D.   On: 12/27/2023 16:10   IR US  Guide Vasc Access Left Result Date: 12/27/2023 INDICATION: Acute renal failure, no current access for dialysis EXAM: ULTRASOUND FLUOROSCOPIC TEMPORARY LEFT IJ DIALYSIS CATHETER MEDICATIONS: 1% LIDOCAINE  LOCAL ANESTHESIA/SEDATION: Total intra-service moderate Sedation Time: None. The patient's level of consciousness and vital signs were monitored continuously by radiology nursing throughout the procedure under my direct supervision. FLUOROSCOPY: Radiation Exposure Index (as provided by the fluoroscopic device): 2.0 mGy Kerma COMPLICATIONS: None immediate. PROCEDURE: Informed written consent was obtained from the patient after a thorough discussion of the procedural risks, benefits and alternatives. All questions were addressed. Maximal Sterile Barrier Technique was utilized  including caps, mask, sterile gowns, sterile gloves, sterile drape, hand hygiene and skin antiseptic. A timeout was performed prior to the initiation of the procedure. Under sterile conditions and local anesthesia, left IJ micropuncture access performed. Images obtained for documentation of the patent left IJV. 018 guidewire advanced centrally followed by the micro dilator set. Measurements obtained for the appropriate length. Amplatz guidewire inserted. Tract dilatation performed to insert a 20 cm Mahurkar type temporary dialysis catheter. Tip position SVC RA junction. Catheter secured with silk sutures and a sterile dressing. Blood aspirated easily followed by saline and heparin  flushes. External caps applied. No immediate complication. Patient tolerated the procedure well. IMPRESSION: Successful ultrasound and fluoroscopic left IJ temporary dialysis catheter. Tip SVC RA junction. Ready for use. Electronically Signed   By: Melven Stable.  Shick M.D.   On: 12/27/2023 16:10   CT ABDOMEN PELVIS WO CONTRAST Result Date: 12/27/2023 CLINICAL DATA:  Acute, nonlocalized abdominal pain EXAM: CT ABDOMEN AND PELVIS WITHOUT CONTRAST TECHNIQUE: Multidetector CT imaging of the abdomen and pelvis was performed following the standard protocol without IV contrast. RADIATION DOSE REDUCTION: This exam was performed according to the departmental dose-optimization program which includes automated exposure control, adjustment of the mA and/or kV according to  patient size and/or use of iterative reconstruction technique. COMPARISON:  09/16/2023 FINDINGS: Lower chest: Symmetric gynecomastia. Small right more than left pleural effusion with atelectasis. There is circumferential thickening of the lower esophagus, chronic based on 2024 CT. Hepatobiliary: Scalloping of the right liver margin related 2 peritoneal based fluid. No liver mass.No evidence of biliary obstruction or stone. Pancreas: Unremarkable. Spleen: Unremarkable. Adrenals/Urinary  Tract: Negative adrenals. No hydronephrosis or stone. Unremarkable bladder. Stomach/Bowel:  No obstruction. No visible bowel inflammation Vascular/Lymphatic: No acute vascular abnormality. No mass or adenopathy. Reproductive:No pathologic findings. Other: Peritoneal dialysis catheter in the lower ventral midline. Peritoneal fluid is large volume and loculated appearing mainly in the ventral abdomen with tracking superior along the right aspect of the liver where there is scalloping, cannot exclude subcapsular component at this level. Bubbles of anti dependent gas presumably introduced through the catheter. The dominant central loculation is 22 cm in transverse span and 8 cm in thickness. Musculoskeletal: No acute abnormalities. Bulky spondylitic spurring with multilevel lower thoracic ankylosis. IMPRESSION: Peritoneal dialysis with loculated peritoneal fluid collections in the ventral abdomen and tracking along the right liver where there could be subcapsular involvement given the degree of right liver scalloping. Correlate for infected dialysate. Small volume pleural fluid with atelectasis at the right more than left base. Electronically Signed   By: Ronnette Coke M.D.   On: 12/27/2023 06:56   VAS US  UPPER EXTREMITY VENOUS DUPLEX Result Date: 12/12/2023 UPPER VENOUS STUDY  Patient Name:  Keldric A Mummert  Date of Exam:   12/11/2023 Medical Rec #: [id number]            Accession #:    [id number] Date of Birth: November 11, 1950           Patient Gender: M Patient Age:   66 years Exam Location:  [facility] Procedure:      VAS US  UPPER EXTREMITY VENOUS DUPLEX Referring Phys: [place] --------------------------------------------------------------------------------  Indications: Edema Comparison Study: No prior study Performing Technologist: Delford Felling MHA, RDMS, RVT, RDCS  Examination Guidelines: A complete evaluation includes B-mode imaging, spectral Doppler, color Doppler, and power Doppler as needed of all  accessible portions of each vessel. Bilateral testing is considered an integral part of a complete examination. Limited examinations for reoccurring indications may be performed as noted.  Right Findings: +----------+------------+---------+-----------+----------+-------+ RIGHT     CompressiblePhasicitySpontaneousPropertiesSummary +----------+------------+---------+-----------+----------+-------+ IJV           Full       Yes       Yes                      +----------+------------+---------+-----------+----------+-------+ Subclavian    Full       Yes       Yes                      +----------+------------+---------+-----------+----------+-------+ Axillary      Full       Yes       Yes                      +----------+------------+---------+-----------+----------+-------+ Brachial      Full       Yes       Yes                      +----------+------------+---------+-----------+----------+-------+ Radial        Full                                          +----------+------------+---------+-----------+----------+-------+  Ulnar         Full                                          +----------+------------+---------+-----------+----------+-------+ Cephalic      Full                                          +----------+------------+---------+-----------+----------+-------+ Basilic       Full                                          +----------+------------+---------+-----------+----------+-------+  Left Findings: +----------+------------+---------+-----------+----------+-------+ LEFT      CompressiblePhasicitySpontaneousPropertiesSummary +----------+------------+---------+-----------+----------+-------+ Subclavian               Yes       Yes                      +----------+------------+---------+-----------+----------+-------+  Summary:  Right: No evidence of deep vein thrombosis in the upper extremity. No evidence of superficial vein thrombosis  in the upper extremity.  Left: No evidence of thrombosis in the subclavian.  *See table(s) above for measurements and observations.  Diagnosing physician: Delaney Fearing Electronically signed by Delaney Fearing on 12/12/2023 at 10:46:59 AM.    Final     Microbiology: Results for orders placed or performed during the hospital encounter of 12/27/23  Culture, blood (routine x 2)     Status: None   Collection Time: 12/27/23  7:27 AM   Specimen: BLOOD RIGHT HAND  Result Value Ref Range Status   Specimen Description BLOOD RIGHT HAND  Final   Special Requests   Final    BOTTLES DRAWN AEROBIC ONLY Blood Culture results may not be optimal due to an inadequate volume of blood received in culture bottles   Culture   Final    NO GROWTH 5 DAYS Performed at [facility], [address], [place], [place] [address]    Report Status 01/01/2024 FINAL  Final  Culture, body fluid w Gram Stain-bottle     Status: Abnormal   Collection Time: 12/27/23  5:30 PM   Specimen: Peritoneal Washings  Result Value Ref Range Status   Specimen Description PERITONEAL  Final   Special Requests DIALYSIS  Final   Gram Stain   Final    GRAM POSITIVE COCCI IN CLUSTERS IN BOTH AEROBIC AND ANAEROBIC BOTTLES CRITICAL RESULT CALLED TO, READ BACK BY AND VERIFIED WITH: A CECIL,RN@[id number]  12/28/23 MK Performed at [facility], [address] [place], [place] [address]    Culture ENTEROCOCCUS FAECALIS (A)  Final   Report Status 12/31/2023 FINAL  Final   Organism ID, Bacteria ENTEROCOCCUS FAECALIS  Final      Susceptibility   Enterococcus faecalis - MIC*    AMPICILLIN <=2 SENSITIVE Sensitive     VANCOMYCIN  1 SENSITIVE Sensitive     GENTAMICIN  SYNERGY SENSITIVE Sensitive     * ENTEROCOCCUS FAECALIS  Gram stain     Status: None   Collection Time: 12/27/23  5:30 PM   Specimen: Peritoneal Washings  Result Value Ref Range Status   Specimen Description PERITONEAL  Final   Special Requests DIALYSIS  Final  Gram Stain    Final    WBC PRESENT, PREDOMINANTLY PMN NO ORGANISMS SEEN CYTOSPIN SMEAR Performed at [facility], [address], [place], [place] [address]    Report Status 12/27/2023 FINAL  Final  Culture, blood (routine x 2)     Status: None   Collection Time: 12/27/23  7:22 PM   Specimen: BLOOD  Result Value Ref Range Status   Specimen Description BLOOD BLOOD LEFT ARM  Final   Special Requests   Final    BOTTLES DRAWN AEROBIC AND ANAEROBIC Blood Culture adequate volume   Culture   Final    NO GROWTH 5 DAYS Performed at [facility], [address] [place], [place] [address]    Report Status 01/01/2024 FINAL  Final  Surgical PCR screen     Status: None   Collection Time: 12/29/23  9:37 PM   Specimen: Nasal Mucosa; Nasal Swab  Result Value Ref Range Status   MRSA, PCR NEGATIVE NEGATIVE Final   Staphylococcus aureus NEGATIVE NEGATIVE Final    Comment: (NOTE) The Xpert SA Assay (FDA approved for NASAL specimens in patients 74 years of age and older), is one component of a comprehensive surveillance program. It is not intended to diagnose infection nor to guide or monitor treatment. Performed at [facility], [address] [place], [place] [address]    Labs: CBC: Recent Labs  Lab 12/31/23 [phone] 01/01/24 0650 01/02/24 0942 01/03/24 0922 01/04/24 0950  WBC 7.8 8.7 8.0 7.3 7.8  NEUTROABS 5.3 6.1 4.7 4.5 4.7  HGB 6.9* 8.6* 9.0* 9.1* 8.3*  HCT 21.3* 26.2* 28.3* 27.9* 26.5*  MCV 81.0 83.2 86.0 85.3 87.2  PLT 292 296 283 265 281   Basic Metabolic Panel: Recent Labs  Lab 12/31/23 0608 01/01/24 0649 01/01/24 0650 01/02/24 0942 01/03/24 0922 01/04/24 0950  NA 135  --  133* 134* 131* 132*  K 3.4*  --  3.9 4.0 4.2 4.3  CL 97*  --  100 101 97* 99  CO2 28  --  24 22 22 24   GLUCOSE 242*  --  204* 220* 311* 189*  BUN 42*  --  28* 35* 20 34*  CREATININE 3.62*  --  2.48* 2.96* 2.43* 3.65*  CALCIUM  6.8*  --  7.4* 8.0* 7.7* 8.0*  MG  --   --   --  1.6*  --  1.8   PHOS  --  2.3*  --  2.9  --  3.0   Liver Function Tests: Recent Labs  Lab 12/30/23 0438 01/02/24 0942 01/04/24 0950  AST 22 52* 79*  ALT 19 26 79*  ALKPHOS 89 86 96  BILITOT 0.9 0.8 0.7  PROT 6.3* 6.3* 6.2*  ALBUMIN  1.8* 1.8* 1.7*   CBG: Recent Labs  Lab 01/03/24 0841 01/03/24 1158 01/03/24 1700 01/03/24 2141 01/04/24 0742  GLUCAP 286* 302* 151* 279* 163*   Discharge time spent: greater than 30 minutes.  Signed: Eveline Hipps, DO [facility] 01/04/2024

## 2024-01-04 NOTE — Plan of Care (Signed)

## 2024-01-04 NOTE — Progress Notes (Signed)
 Pt complaining of pain, warmth and tenderness in left forearm IV site. Removed IV and replaced on Right wrist. Tolerated well.

## 2024-01-04 NOTE — TOC Progression Note (Addendum)
 Transition of Care [facility]) - Progression Note    Patient Details  Name: Russell Austin MRN: [id number] Date of Birth: 07-Oct-1950  [facility]) CM/SW Contact  Claudean Crumbly, LCSWA Phone Number: 01/04/2024, 10:03 AM  Clinical Narrative:     SW spoke with [facility] ([phone]) Siegfried Dress still pending.   Update 209pm Auth still pending.  SW spoke with Laurina Popper Alray Askew [phone]) reports should have bed available tomorrow.    Expected Discharge Plan: Skilled Nursing Facility Barriers to Discharge: Insurance Authorization  Expected Discharge Plan and Services In-house Referral: Clinical Social Work     Living arrangements for the past 2 months: Single Family Home                                       Social Determinants of Health (SDOH) Interventions SDOH Screenings   Food Insecurity: No Food Insecurity (12/27/2023)  Housing: Low Risk  (12/27/2023)  Transportation Needs: No Transportation Needs (12/27/2023)  Utilities: Not At Risk (12/27/2023)  Alcohol Screen: Low Risk  (01/21/2023)  Depression (PHQ2-9): Low Risk  (02/28/2023)  Recent Concern: Depression (PHQ2-9) - High Risk (01/08/2023)  Financial Resource Strain: Low Risk  (01/21/2023)  Physical Activity: Inactive (01/21/2023)  Social Connections: Socially Integrated (12/27/2023)  Stress: No Stress Concern Present (01/21/2023)  Tobacco Use: Low Risk  (12/30/2023)    Readmission Risk Interventions     No data to display

## 2024-01-04 NOTE — Progress Notes (Signed)
 Russell Austin called from dialysis for a report on pt. Made her aware that the Pt needs to try and tolerate today's treatment in a chair.

## 2024-01-05 ENCOUNTER — Inpatient Hospital Stay (HOSPITAL_COMMUNITY)

## 2024-01-05 DIAGNOSIS — I160 Hypertensive urgency: Secondary | ICD-10-CM | POA: Diagnosis not present

## 2024-01-05 DIAGNOSIS — K659 Peritonitis, unspecified: Secondary | ICD-10-CM | POA: Diagnosis not present

## 2024-01-05 DIAGNOSIS — D649 Anemia, unspecified: Secondary | ICD-10-CM | POA: Diagnosis not present

## 2024-01-05 DIAGNOSIS — N186 End stage renal disease: Secondary | ICD-10-CM | POA: Diagnosis not present

## 2024-01-05 LAB — GLUCOSE, CAPILLARY
Glucose-Capillary: 114 mg/dL — ABNORMAL HIGH (ref 70–99)
Glucose-Capillary: 110 mg/dL — ABNORMAL HIGH (ref 70–99)
Glucose-Capillary: 161 mg/dL — ABNORMAL HIGH (ref 70–99)

## 2024-01-05 NOTE — Progress Notes (Signed)
 Patient d/c to [facility] at [phone]. Patient going to Rm 156A. Report given to [facility].

## 2024-01-05 NOTE — TOC Transition Note (Signed)
 Transition of Care [facility]) - Discharge Note   Patient Details  Name: Russell Austin MRN: [id number] Date of Birth: 12/11/1950  Transition of Care [facility]) CM/SW Contact:  Hilda Lovings, LCSW Phone Number: 01/05/2024, 3:33 PM   Clinical Narrative:     CSW followed-up with the clinical team to address the patient's disposition (SNF acceptance to [facility])  Note: CSW attempted to contact facility rep: Whitney but was bel to make contact with her. CSW left voicemail and text message for the liaison.   This Clinical research associate made contact with SNF facility ([facility] representative (CN [phone]) and obtained facility transfer information.   CSW met with the patient at the bedside and updated them to their current disposition and coordination efforts.   CSW contacted the patient's natural support (Son: Cem Colucci. [phone]) and updated them to the patient's current disposition and coordination efforts.  (Natural support) noted   This Clinical research associate followed-up the discharge process and arranged transportation by way of ([facility]) for the patient to the facility.  Trasfer date: 01/05/2024 Estimated time of discharge: 5:00 PM  CSW updated the clinical team to the above information, and noted [facility] transport arrangements, along with the patient and natural supports.   Transfer process update: CSW contacted the pt's family/natural supports to update pt's disposition (transfer to noted SNF accepting the patient's referral) CSW arranged ambulance transport via [facility] to [facility]).   Nurse call report number [phone]) prior to discharge Room number: 156A CSW provied the CN/bedside RN with discharge documentation (DC Packet) needed for transfer to the SNF at the nrusing station.   No other needs identified of this Clinical research associate. Please contact TOC if there is additional disposition support needed.    Final next level of care: Skilled Nursing Facility Barriers to Discharge: Insurance  Authorization   Patient Goals and CMS Choice    Discharge Placement   Discharge Plan and Services Additional resources added to the After Visit Summary for   In-house Referral: Clinical Social Work                                   Social Drivers of Health (SDOH) Interventions SDOH Screenings   Food Insecurity: No Food Insecurity (12/27/2023)  Housing: Low Risk  (12/27/2023)  Transportation Needs: No Transportation Needs (12/27/2023)  Utilities: Not At Risk (12/27/2023)  Alcohol Screen: Low Risk  (01/21/2023)  Depression (PHQ2-9): Low Risk  (02/28/2023)  Recent Concern: Depression (PHQ2-9) - High Risk (01/08/2023)  Financial Resource Strain: Low Risk  (01/21/2023)  Physical Activity: Inactive (01/21/2023)  Social Connections: Socially Integrated (12/27/2023)  Stress: No Stress Concern Present (01/21/2023)  Tobacco Use: Low Risk  (12/30/2023)     Readmission Risk Interventions     No data to display

## 2024-01-05 NOTE — Progress Notes (Signed)
 PROGRESS NOTE    Russell Austin  JXB:[id number] DOB: Sep 23, 1950 DOA: 12/27/2023 PCP: Alto Atta, NP   Brief Narrative:  The patient is a 73 year old African-American male with a past medical history significant for but not limited to diabetes mellitus type 2 with retinopathy and glaucoma with right eye visual loss, prior lower brainstem infarct in 2016 needing a core track and CIR evaluation, hyperlipidemia, ESRD who is now previously on PD but having Enterococcus faecalis peritonitis who was directed to come to the ED on 12/27/2023 by nephrology after his PD fluid check showed milligram negative cocci bacilli, rare gram-positive cocci in pairs and clusters with resistance to ampicillin, cefazolin .  He was CT of the abdomen pelvis which showed loculated peritoneal fluid collections in the ventral abdomen tracking along the right liver with subscapular involvement given degree of right liver scalloping and small volume pleural fluid without ectasis more on the right and left base.  Nephrology evaluated and vascular surgery was consulted and plan is for permanent access and he had a temporary trialysis catheter placed but had a complication of bleeding from the left jugular catheter site and had to be given desmopressin  x 2.  On 12/22/2023 his PD catheter was removed.  Patient's sepsis physiology has resolved and ID had to be consulted and they are recommending vancomycin  and cefepime  post HD for total 3 weeks.  Currently patient has been accepted at [facility] on TThSat 11:30 chair time. The SNF could not accept him until Saturday however his bed was given away and will need to continually check on bed availability. He remains medically stable for D/C at this time as of 01/04/24.   Assessment and Plan:  Sepsis 2/2 Polymicrobial Dialysate : CT Abd/Pelvis done and showed "Peritoneal dialysis with loculated peritoneal fluid collections in the ventral abdomen and tracking along the right liver where  there could be subcapsular involvement given the degree of right liver scalloping. Correlate for infected dialysate. Small volume pleural fluid with atelectasis at the right more than left base." Patient is Not bacteremic--removed PD cath;  Gram-negative coccobacilli/gram-positive cocci pair/cluster on 4/15--growing Enterococcus 4/25--IV cefepime /vancomycin  for Duration 3 weeks per ID with end date of 01/16/24. BC x2 negative from 4/25. Sepsis physiology resolved currently and he is stable   Anemia Blood loss expected secondary to bleeding from left trialysis jugular site superimposed on Anemia of Chronic Renal Disease: Temp cath placed for iHD; s/p Desmopressin  X2 per IR on 4/25. Hemoglobin below 7 on 4 /29--no acute bleed-suspect lab variation transfused with HD 1 unit PRBC. Hgb/Hct Trend was fairly stable but now did drop from 9.1/27.9 -> 8.3/26.5 on last check; Continue Holding aspirin  81 at this time for now--- resume as outpatient once all bleeding risk over   ESRD now on HD instead of PD / Hyperparathyroid: BUN/Cr now 34/3.65; Trialysis catheter placed 4/25-dialysis through this; not taking calcitriol  0.25 daily but reordered -Phos Level was now 3.0 and Calcium  was 8.0 -Requires outpatient set up with HD and placement with skilled facility at discharge and facility cannot take him until Saturday per TOC however now the patient's bed has been given away for unclear when he will be able to be discharged.  Per Nephrology social worker patient will need to complete issue in a chair tomorrow and so this is being arranged. -Avoid Nephrotoxic Medications, Contrast Dyes, Hypotension and Dehydration to Ensure Adequate Renal Perfusion and will need to Renally Adjust Meds. CTM and Trend Renal Function carefully and repeat CMP in the  AM   Hyponatremia: Mild. Na+ 133 -> 134 -> 131 -> 132 on last check. CTM and Trend and repeat CMP in the AM. Likely to be further corrected in Dialysis  Hypomagnesemia: Mag Level  was 1.8 on last check. Replete with IV Mag Sulfate 2 grams. CTM and replete as Necessary. Repeat Mag Level in the AM  Essential HTN: Nephrology change meds to Amlodipine  5 mg po daily; Losartan  100 mg po Daily and Metoprolol  Tartrate 50 mg po twice daily. CTM BP per Protocol. Last BP reading was 146/69  HLD: Hold Atorvastatin  10 mg po Daily given abnormal LFTs   Mildly elevated LFT/Transaminitis: Likely 2/2 infection; Current Trend shows AST went from 45 -> 22 -> 52 -> 79 on last check. ALT went from 38 -> 19 -> 26 -> 79 on last check. CTM and Trend Hepatic Fxn carefully; Repeat CMP w/in 1 week -Obtained RUQ U/S and showed "Complex fluid with internal echoes collecting along side the right lobe of the liver corresponding to the perihepatic loculated fluid on CT. No gallstones, wall thickening or biliary dilatation." Acute Hepatitis Panel Negative  Thrombocytosis: Improved and resolved. 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 status; C/w Semglee  18 units sq at bedtime, Novolog  2 units 3 times daily meals, Moderate sliding scale increased to Resistant Scale; Continue Holding Glucotrol  XL 5: CBG ranging from 78-178 with intermittent lows which are improved. C/w Gabapentin  300  mg po qHS   Previous CVA in 2016: Dense R side deficit--using wc for the past 3 mo and debilitated; PT/OT now recommending SNF; Hold Atorvastatin  10 mg po daily given abnormal LFTs  GERD/GI Prophylaxis: C/w PPI with Pantoprazole  40 mg po Daily   Hypoalbuminemia: Patient's Albumin  is now 1.7. CTM and Trend and repeat CMP in the AM.   DVT prophylaxis: heparin  injection 5,000 Units Start: 12/31/23 0600 SCDs Start: 12/27/23 0849    Code Status: Full Code Family Communication:  No family present @ bedside  Disposition Plan:   Level of care: Telemetry Medical Status is: Inpatient Remains inpatient appropriate because: Medically stable for D/C and awaiting Insurance Authorization and Bed Availability   Consultants:  Nephrology Infectious Diseases  Vascular Surgery Interventional Radiology  Procedures:  As delineated as above  Antimicrobials:  Anti-infectives (From admission, onward)    Start     Dose/Rate Route Frequency Ordered Stop   01/02/24 1245  vancomycin  (VANCOCIN ) IVPB 1000 mg/200 mL premix        1,000 mg 200 mL/hr over 60 Minutes Intravenous Every T-Th-Sa (Hemodialysis) 01/02/24 1155 01/16/24 1800   01/02/24 1200  vancomycin  (VANCOREADY) IVPB 750 mg/150 mL  Status:  Discontinued        750 mg 150 mL/hr over 60 Minutes Intravenous Every T-Th-Sa (Hemodialysis) 01/02/24 0332 01/02/24 1155   01/01/24 0000  ceFEPime  (MAXIPIME ) IVPB        2 g Intravenous Every Dialysis 01/01/24 1310 01/15/24 2359   01/01/24 0000  vancomycin  IVPB        750 mg Intravenous Every Dialysis 01/01/24 1310 01/15/24 2359   12/31/23 1200  vancomycin  (VANCOCIN ) 750 mg in sodium chloride  0.9 % 250 mL IVPB  Status:  Discontinued        750 mg 265 mL/hr over 60 Minutes Intravenous Every T-Th-Sa (Hemodialysis) 12/28/23  1101 01/02/24 0332   12/28/23 2245  vancomycin  (VANCOCIN ) 750 mg in sodium chloride  0.9 % 150 mL IVPB  Status:  Discontinued        750 mg 165 mL/hr over 60 Minutes Intravenous NOW 12/28/23 2236 12/28/23 2240   12/28/23 2245  vancomycin  (VANCOREADY) IVPB 750 mg/150 mL        750 mg 150 mL/hr over 60 Minutes Intravenous Once 12/28/23 2240 12/28/23 2350   12/28/23 1800  vancomycin  (VANCOCIN ) 750 mg in sodium chloride  0.9 % 150 mL IVPB  Status:  Discontinued        750 mg 165 mL/hr over 60 Minutes Intravenous  Once 12/28/23 1059 12/28/23 2236   12/27/23 1300  ceFEPIme  (MAXIPIME ) 1 g in sodium chloride  0.9 % 100 mL IVPB        1 g 200 mL/hr over 30 Minutes Intravenous Daily at 10 pm 12/27/23 1203     12/27/23  1215  vancomycin  (VANCOREADY) IVPB 1500 mg/300 mL        1,500 mg 150 mL/hr over 120 Minutes Intravenous  Once 12/27/23 1206 12/27/23 1904   12/27/23 1100  ceFEPIme  (MAXIPIME ) 2 g in sodium chloride  0.9 % 100 mL IVPB  Status:  Discontinued        2 g 200 mL/hr over 30 Minutes Intravenous Every 24 hours 12/27/23 1051 12/27/23 1133       Subjective: Seen and examined at bedside and he is resting and in no acute distress.  Denies any complaints of any pain or any nausea or vomiting.  No other concerns or complaints at this time.  Awaiting insurance authorization and bed availability but he remains medically stable.  Objective: Vitals:   01/04/24 2012 01/05/24 0526 01/05/24 0826 01/05/24 0900  BP: 130/63 138/72 137/67 (!) 146/69  Pulse: 78 71 69 72  Resp: 18 18 17    Temp: 98.3 F (36.8 C) 98.3 F (36.8 C) 98.2 F (36.8 C) 98.3 F (36.8 C)  TempSrc: Oral Oral Oral Oral  SpO2: 100% 100% 100% 100%  Weight:  74.2 kg    Height:        Intake/Output Summary (Last 24 hours) at 01/05/2024 1426 Last data filed at 01/05/2024 0800 Gross per 24 hour  Intake 120 ml  Output 300 ml  Net -180 ml   Filed Weights   01/02/24 0500 01/03/24 0530 01/05/24 0526  Weight: 72.1 kg 70.6 kg 74.2 kg   Examination: Physical Exam:  Constitutional: WN/WD elderly chronically ill-appearing African-American male no acute distress sitting up in the bed Respiratory: Diminished to auscultation bilaterally with some coarse breath sounds, no wheezing, rales, rhonchi or crackles. Normal respiratory effort and patient is not tachypenic. No accessory muscle use.  Unlabored breathing Cardiovascular: RRR, no murmurs / rubs / gallops. S1 and S2 auscultated. No extremity edema.   Abdomen: Soft, non-tender, non-distended. Bowel sounds positive.  GU: Deferred. Musculoskeletal: No clubbing / cyanosis of digits/nails. No joint deformity upper and lower extremities.  Skin: No rashes, lesions, ulcers on limited skin evaluation.  No induration; Warm and dry.  Neurologic: Has some blindness and density right-sided deficits but other cranial nerves II through XII are grossly intact. Psychiatric: Normal judgment and insight. Alert and oriented x 3.   Data Reviewed: I have personally reviewed following labs and imaging studies  CBC: Recent Labs  Lab 12/31/23 0608 01/01/24 0650 01/02/24 0942 01/03/24 0922 01/04/24 0950  WBC 7.8 8.7 8.0 7.3 7.8  NEUTROABS 5.3 6.1 4.7 4.5 4.7  HGB 6.9* 8.6*  9.0* 9.1* 8.3*  HCT 21.3* 26.2* 28.3* 27.9* 26.5*  MCV 81.0 83.2 86.0 85.3 87.2  PLT 292 296 283 265 281   Basic Metabolic Panel: Recent Labs  Lab 12/31/23 0608 01/01/24 0649 01/01/24 0650 01/02/24 0942 01/03/24 0922 01/04/24 0950  NA 135  --  133* 134* 131* 132*  K 3.4*  --  3.9 4.0 4.2 4.3  CL 97*  --  100 101 97* 99  CO2 28  --  24 22 22 24   GLUCOSE 242*  --  204* 220* 311* 189*  BUN 42*  --  28* 35* 20 34*  CREATININE 3.62*  --  2.48* 2.96* 2.43* 3.65*  CALCIUM  6.8*  --  7.4* 8.0* 7.7* 8.0*  MG  --   --   --  1.6*  --  1.8  PHOS  --  2.3*  --  2.9  --  3.0   GFR: Estimated Creatinine Clearance: 17.7 mL/min (A) (by C-G formula based on SCr of 3.65 mg/dL (H)). Liver Function Tests: Recent Labs  Lab 12/30/23 0438 01/02/24 0942 01/04/24 0950  AST 22 52* 79*  ALT 19 26 79*  ALKPHOS 89 86 96  BILITOT 0.9 0.8 0.7  PROT 6.3* 6.3* 6.2*  ALBUMIN  1.8* 1.8* 1.7*   No results for input(s): "LIPASE", "AMYLASE" in the last 168 hours. No results for input(s): "AMMONIA" in the last 168 hours. Coagulation Profile: No results for input(s): "INR", "PROTIME" in the last 168 hours. Cardiac Enzymes: No results for input(s): "CKTOTAL", "CKMB", "CKMBINDEX", "TROPONINI" in the last 168 hours. BNP (last 3 results) No results for input(s): "PROBNP" in the last 8760 hours. HbA1C: No results for input(s): "HGBA1C" in the last 72 hours. CBG: Recent Labs  Lab 01/04/24 1440 01/04/24 1638 01/04/24 2010 01/05/24 0805  01/05/24 1129  GLUCAP 78 169* 178* 114* 110*   Lipid Profile: No results for input(s): "CHOL", "HDL", "LDLCALC", "TRIG", "CHOLHDL", "LDLDIRECT" in the last 72 hours. Thyroid  Function Tests: No results for input(s): "TSH", "T4TOTAL", "FREET4", "T3FREE", "THYROIDAB" in the last 72 hours. Anemia Panel: No results for input(s): "VITAMINB12", "FOLATE", "FERRITIN", "TIBC", "IRON", "RETICCTPCT" in the last 72 hours. Sepsis Labs: No results for input(s): "PROCALCITON", "LATICACIDVEN" in the last 168 hours.  Recent Results (from the past 240 hours)  Culture, blood (routine x 2)     Status: None   Collection Time: 12/27/23  7:27 AM   Specimen: BLOOD RIGHT HAND  Result Value Ref Range Status   Specimen Description BLOOD RIGHT HAND  Final   Special Requests   Final    BOTTLES DRAWN AEROBIC ONLY Blood Culture results may not be optimal due to an inadequate volume of blood received in culture bottles   Culture   Final    NO GROWTH 5 DAYS Performed at [facility], [address], [place], [place] [address]    Report Status 01/01/2024 FINAL  Final  Culture, body fluid w Gram Stain-bottle     Status: Abnormal   Collection Time: 12/27/23  5:30 PM   Specimen: Peritoneal Washings  Result Value Ref Range Status   Specimen Description PERITONEAL  Final   Special Requests DIALYSIS  Final   Gram Stain   Final    GRAM POSITIVE COCCI IN CLUSTERS IN BOTH AEROBIC AND ANAEROBIC BOTTLES CRITICAL RESULT CALLED TO, READ BACK BY AND VERIFIED WITH: A CECIL,RN@[id number]  12/28/23 MK Performed at [facility], [address] [place], [place] [address]    Culture ENTEROCOCCUS FAECALIS (A)  Final   Report Status 12/31/2023 FINAL  Final   Organism ID, Bacteria ENTEROCOCCUS FAECALIS  Final      Susceptibility   Enterococcus faecalis - MIC*    AMPICILLIN <=2 SENSITIVE Sensitive     VANCOMYCIN  1 SENSITIVE Sensitive     GENTAMICIN  SYNERGY SENSITIVE Sensitive     * ENTEROCOCCUS FAECALIS  Gram stain      Status: None   Collection Time: 12/27/23  5:30 PM   Specimen: Peritoneal Washings  Result Value Ref Range Status   Specimen Description PERITONEAL  Final   Special Requests DIALYSIS  Final   Gram Stain   Final    WBC PRESENT, PREDOMINANTLY PMN NO ORGANISMS SEEN CYTOSPIN SMEAR Performed at [facility], [address] [place], [place] [address]    Report Status 12/27/2023 FINAL  Final  Culture, blood (routine x 2)     Status: None   Collection Time: 12/27/23  7:22 PM   Specimen: BLOOD  Result Value Ref Range Status   Specimen Description BLOOD BLOOD LEFT ARM  Final   Special Requests   Final    BOTTLES DRAWN AEROBIC AND ANAEROBIC Blood Culture adequate volume   Culture   Final    NO GROWTH 5 DAYS Performed at [facility], [address] [place], [place] [address]    Report Status 01/01/2024 FINAL  Final  Surgical PCR screen     Status: None   Collection Time: 12/29/23  9:37 PM   Specimen: Nasal Mucosa; Nasal Swab  Result Value Ref Range Status   MRSA, PCR NEGATIVE NEGATIVE Final   Staphylococcus aureus NEGATIVE NEGATIVE Final    Comment: (NOTE) The Xpert SA Assay (FDA approved for NASAL specimens in patients 2 years of age and older), is one component of a comprehensive surveillance program. It is not intended to diagnose infection nor to guide or monitor treatment. Performed at [facility], [address] [place], [place] [address]     Radiology Studies: US  Abdomen Limited RUQ (LIVER/GB) Result Date: 01/05/2024 CLINICAL DATA:  [id number].  Abnormal liver enzymes. EXAM: ULTRASOUND ABDOMEN LIMITED RIGHT UPPER QUADRANT COMPARISON:  CT without contrast 12/27/2023 FINDINGS: Gallbladder: No gallstones or wall thickening visualized. No sonographic Murphy sign noted by sonographer. Common bile duct: Diameter: 4.7 mm.  No intrahepatic bile duct dilatation. Liver: No focal lesion identified. Within normal limits in parenchymal echogenicity. Portal vein is patent on  color Doppler imaging with normal direction of blood flow towards the liver. Other: Complex fluid with internal echoes collects along side the right lobe of the liver corresponding to the perihepatic loculated fluid on CT. IMPRESSION: 1. Complex fluid with internal echoes collecting along side the right lobe of the liver corresponding to the perihepatic loculated fluid on CT. 2. No gallstones, wall thickening or biliary dilatation. Electronically Signed   By: Denman Fischer M.D.   On: 01/05/2024 06:04   Scheduled Meds:  amLODipine   5 mg Oral Daily   buPROPion   300 mg Oral q morning   calcitRIOL   0.5 mcg Oral Daily   Chlorhexidine  Gluconate Cloth  6 each Topical Q0600   darbepoetin (ARANESP ) injection - NON-DIALYSIS  40 mcg Subcutaneous Q Tue-1800   gabapentin   300 mg Oral QHS   gentamicin  cream  1 Application Topical Daily   heparin  injection (subcutaneous)  5,000 Units Subcutaneous Q8H   insulin  aspart  0-20 Units Subcutaneous TID WC   insulin  aspart  2 Units Subcutaneous TID WC   insulin   glargine-yfgn  22 Units Subcutaneous QHS   losartan   100 mg Oral Daily   metoprolol  tartrate  50 mg Oral BID   pantoprazole   40 mg Oral Daily   potassium chloride   20 mEq Oral Daily   sodium chloride  flush  10-40 mL Intracatheter Q12H   sodium chloride  flush  3 mL Intravenous Q12H   Continuous Infusions:  ceFEPime  (MAXIPIME ) IV 1 g (01/04/24 2249)   vancomycin  1,000 mg (01/04/24 1518)    LOS: 9 days   Aura Leeds, DO [facility] Available via Epic secure chat 7am-7pm After these hours, please refer to coverage provider listed on [email or web address] 01/05/2024, 2:26 PM

## 2024-01-05 NOTE — Progress Notes (Addendum)
 Ultrasound being performed at bedside. Made central monitoring aware.

## 2024-01-05 NOTE — Discharge Planning (Signed)
 [facility]  Initial Hemodialysis Orders  Dialysis center: [facility]  Patient's name: Russell Austin DOB: February 17, 1951 AKI or ESRD: ESRD  Discharge diagnosis: peritonitis 2. Acute blood loss anemia s/p TDC placement  Allergies:  Allergies  Allergen Reactions   Lactose Intolerance (Gi) Diarrhea   Apple Pectin [Pectin] Itching    ITCHY THROAT   Metformin  And Related Other (See Comments)    D/t decreased kidney function    Peach [Prunus Persica] Itching    ITCHY THROAT   Morphine  And Codeine Anxiety    Jittery    Date of First Dialysis: 12/28/23 (formerly PD)  Dialysis Prescription: Dialysis Frequency: TIW Tx duration: 4 hours BFR: 400 DFR: 800 EDW: 74.5kg  Dialyzer: 180NRe UF profile/Sodium modeling?: no Dialysis Bath: 2 K 2 Ca  Dialysis access: Access type: [facility] Date placed: 12/27/23 Surgeon: [facility] IR  In Center Medications: Heparin  Dose: none  Type: N/A VDRA: Calcitriol  0.5mcg PO q HD- can stop home calcitriol  Venofer: none Mircera: 75 mcg IV q 2 weeks Next dose due: 01/07/24 Sensipar: none IV antibiotics: vancomycin  750mg  IV q HD and cefepime  2g IV q HD- last day 01/16/24  Discharge labs: Hgb: 8.3 K+: 4.3  Ca: 8.0  Phos: 3.0 Alb: 1.7  Please draw routine labs. Additional labs needed: see antibiotic order above Additional notes/follow-up: Refer to vascular surgery for permanent access  Ramona Burner, PA-C 01/05/2024, 11:31 AM  [facility] Pager: [phone]

## 2024-01-05 NOTE — Discharge Summary (Addendum)
 Physician Discharge Summary   Patient: Russell Austin MRN: [id number] DOB: [birth year]  Admit date:     12/27/2023  Discharge date: 01/04/24  Discharge Physician: Aura Leeds, DO   PCP: [place] Atta, NP   Recommendations at discharge:   Follow-up with PCP within 1 to 2 weeks repeat CBC, CMP, mag, Phos within 1 week Follow-up with Nephrology in outpatient setting and continue hemodialysis Tuesday Thursday Saturday and continue antibiotics with dialysis for 3 weeks per ID Follow up with IR in the outpatient setting for possible drain placement for Complex fluid with internal echoes collecting along side the right lobe of the liver noted on U/S that corresponds to the perihepatic loculated fluid on CT.  Discharge Diagnoses: Principal Problem:   Peritonitis (HCC) Active Problems:   ESRD on dialysis [facility])   Hypertensive urgency   Normocytic anemia   Controlled diabetes mellitus with arthropathy, with long-term current use of insulin  (HCC)   Hyperlipidemia   Legally blind  Resolved Problems:   * No resolved hospital problems. [facility] Course: The patient is a 73 year old African-American male with a past medical history significant for but not limited to diabetes mellitus type 2 with retinopathy and glaucoma with right eye visual loss, prior lower brainstem infarct in 2016 needing a core track and CIR evaluation, hyperlipidemia, ESRD who is now previously on PD but having Enterococcus faecalis peritonitis who was directed to come to the ED on 12/27/2023 by nephrology after his PD fluid check showed milligram negative cocci bacilli, rare gram-positive cocci in pairs and clusters with resistance to ampicillin, cefazolin .  He was CT of the abdomen pelvis which showed loculated peritoneal fluid collections in the ventral abdomen tracking along the right liver with subscapular involvement given degree of right liver scalloping and small volume pleural fluid without ectasis more on the  right and left base.  Nephrology evaluated and vascular surgery was consulted and plan is for permanent access and he had a temporary trialysis catheter placed but had a complication of bleeding from the left jugular catheter site and had to be given desmopressin  x 2.  On 12/22/2023 his PD catheter was removed.  Patient's sepsis physiology has resolved and ID had to be consulted and they are recommending vancomycin  and cefepime  post HD for total 3 weeks.  Currently patient has been accepted at [facility] on TThSat 11:30 chair time. The SNF could not accept him until Saturday however his bed was given away and will need to continually check on bed availability. He remains medically stable for D/C at this time as of 01/04/24.   Assessment and Plan:  Sepsis 2/2 Polymicrobial Dialysate : CT Abd/Pelvis done and showed "Peritoneal dialysis with loculated peritoneal fluid collections in the ventral abdomen and tracking along the right liver where there could be subcapsular involvement given the degree of right liver scalloping. Correlate for infected dialysate. Small volume pleural fluid with atelectasis at the right more than left base." Patient is Not bacteremic--removed PD cath;  Gram-negative coccobacilli/gram-positive cocci pair/cluster on 4/15--growing Enterococcus 4/25--IV cefepime /vancomycin  for Duration 3 weeks per ID with end date of 01/16/24. BC x2 negative from 4/25. Sepsis physiology resolved currently and he is stable; See below in Addendum   Anemia Blood loss expected secondary to bleeding from left trialysis jugular site superimposed on Anemia of Chronic Renal Disease: Temp cath placed for iHD; s/p Desmopressin  X2 per IR on 4/25. Hemoglobin below 7 on 4 /29--no acute bleed-suspect lab variation transfused with HD 1 unit PRBC.  Hgb/Hct Trend was fairly stable but now did drop from 9.1/27.9 -> 8.3/26.5 on last check; Continue Holding aspirin  81 at this time for now--- resume as outpatient once all  bleeding risk over   ESRD now on HD instead of PD / Hyperparathyroid: BUN/Cr now 34/3.65; Trialysis catheter placed 4/25-dialysis through this; not taking calcitriol  0.25 daily but reordered -Phos Level was now 3.0 and Calcium  was 8.0 -Requires outpatient set up with HD and placement with skilled facility at discharge and facility cannot take him until Saturday per TOC however now the patient's bed has been given away for unclear when he will be able to be discharged.  Per Nephrology social worker patient will need to complete issue in a chair tomorrow and so this is being arranged. -Avoid Nephrotoxic Medications, Contrast Dyes, Hypotension and Dehydration to Ensure Adequate Renal Perfusion and will need to Renally Adjust Meds. CTM and Trend Renal Function carefully and repeat CMP in the AM   Hyponatremia: Mild. Na+ 133 -> 134 -> 131 -> 132 on last check. CTM and Trend and repeat CMP in the AM. Likely to be further corrected in Dialysis  Hypomagnesemia: Mag Level was 1.8 on last check. Replete with IV Mag Sulfate 2 grams. CTM and replete as Necessary. Repeat Mag Level in the AM  Essential HTN: Nephrology change meds to Amlodipine  5 mg po daily; Losartan  100 mg po Daily and Metoprolol  Tartrate 50 mg po twice daily. CTM BP per Protocol. Last BP reading was 146/69  HLD: Hold Atorvastatin  10 mg po Daily given abnormal LFTs   Mildly elevated LFT/Transaminitis: Likely 2/2 infection; Current Trend shows AST went from 45 -> 22 -> 52 -> 79 on last check. ALT went from 38 -> 19 -> 26 -> 79 on last check. CTM and Trend Hepatic Fxn carefully; Repeat CMP w/in 1 week -Obtained RUQ U/S and showed "Complex fluid with internal echoes collecting along side the right lobe of the liver corresponding to the perihepatic loculated fluid on CT. No gallstones, wall thickening or biliary dilatation." Acute Hepatitis Panel Negative  **ADDENDUM 01/05/24 @ 16:45: D/w ID again (Dr. Zelda Hickman) about his U/S findings who recommended  getting outpatient IR evaluation for possible drain placement since patient being D/C'd to SNF today as he is otherwise stable. Outpatient IR eval placed and attempted to notify Dr. Jinx Mourning (IR secretary picked up his phone and confirmed patient's information). ID to follow up with the patient within the coming weeks and will ensure IR will see the patient at some point. Remains stable for D/C at this time and will be transported to the facility  Thrombocytosis: Improved and resolved. 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 status; C/w Semglee  18 units sq at bedtime, Novolog  2 units 3 times daily meals, Moderate sliding scale increased to Resistant Scale; Continue Holding Glucotrol  XL 5: CBG ranging from 78-178 with intermittent lows which are improved. C/w Gabapentin  300  mg po qHS   Previous CVA in 2016: Dense R side deficit--using wc for the past 3 mo and debilitated; PT/OT now recommending SNF; Hold Atorvastatin  10 mg po daily given abnormal LFTs  GERD/GI Prophylaxis: C/w PPI with Pantoprazole  40 mg po Daily   Hypoalbuminemia: Patient's Albumin  is now 1.7. CTM and Trend and repeat CMP in the AM.  Consultants:  Nephrology Infectious Diseases  Vascular Surgery Interventional Radiology  Procedures performed: As delineated as above   Disposition: Skilled nursing facility  Diet recommendation:  Renal diet DISCHARGE MEDICATION: Allergies as of 01/05/2024       Reactions   Lactose Intolerance (gi) Diarrhea   Apple Pectin [pectin] Itching   ITCHY THROAT   Metformin  And Related Other (See Comments)   D/t decreased kidney function    Peach [prunus Persica] Itching   ITCHY THROAT   Morphine  And Codeine Anxiety   Jittery        Medication List     PAUSE taking these medications     atorvastatin  10 MG tablet Wait to take this until your doctor or other care provider tells you to start again. Commonly known as: LIPITOR Take 1 tablet (10 mg total) by mouth every morning.       STOP taking these medications    chlorthalidone 25 MG tablet Commonly known as: HYGROTON   glipiZIDE  5 MG 24 hr tablet Commonly known as: GLUCOTROL  XL   torsemide  20 MG tablet Commonly known as: DEMADEX        TAKE these medications    acetaminophen  325 MG tablet Commonly known as: TYLENOL  Take 2 tablets (650 mg total) by mouth every 6 (six) hours as needed for mild pain (pain score 1-3) (or Fever >/= 101).   albuterol  (2.5 MG/3ML) 0.083% nebulizer solution Commonly known as: PROVENTIL  Take 3 mLs (2.5 mg total) by nebulization every 6 (six) hours as needed for wheezing or shortness of breath.   amLODipine  5 MG tablet Commonly known as: NORVASC  Take 1 tablet (5 mg total) by mouth daily.   aspirin  EC 81 MG tablet Take 81 mg by mouth daily.   B-D ULTRAFINE III SHORT PEN 31G X 8 MM Misc Generic drug: Insulin  Pen Needle Use to inject insulin  4 times per day as instructed   buPROPion  300 MG 24 hr tablet Commonly known as: WELLBUTRIN  XL TAKE 1 TABLET (300 MG TOTAL) BY MOUTH EVERY MORNING.   calcitRIOL  0.5 MCG capsule Commonly known as: ROCALTROL  Take 1 capsule (0.5 mcg total) by mouth daily. What changed:  medication strength how much to take   ceFEPime  IVPB Commonly known as: MAXIPIME  Inject 2 g into the vein every dialysis for up to 14 days (MWF or TTS). Indication:  Polymicrobial peritonitis Last Day of Therapy:  01/16/24 Labs - Once weekly:  CBC/D and BMP, ESR and CRP Method of administration: Per hemodialysis protocol - to be given at HD center   chlorproMAZINE  25 MG tablet Commonly known as: THORAZINE  TAKE 1 TABLET BY MOUTH THREE TIMES A DAY What changed:  when to take this reasons to take this   FreeStyle Libre 3 Plus Sensor Misc Change sensor every 15  days.   gabapentin  300 MG capsule Commonly known as: NEURONTIN  TAKE ONE CAPSULE BY MOUTH EVERYDAY AT BEDTIME   gentamicin  cream 0.1 % Commonly known as: GARAMYCIN  Apply 1 Application topically daily.   lactulose 10 GM/15ML solution Commonly known as: CHRONULAC Take by mouth daily as needed for mild constipation or moderate constipation.   Lantus  SoloStar 100 UNIT/ML Solostar Pen Generic drug: insulin  glargine INJECT 20 UNITS INTO THE SKIN DAILY What changed:  how much to take when to take this   losartan  100 MG tablet Commonly known as: COZAAR  TAKE 1 TABLET BY MOUTH EVERY DAY IN THE MORNING   metoCLOPramide  10 MG tablet Commonly known as: REGLAN  TAKE 1 TABLET BY MOUTH 4  TIMES DAILY.   metoprolol  tartrate 50 MG tablet Commonly known as: LOPRESSOR  Take 50 mg by mouth 2 (two) times daily.   MIRCERA IJ Inject into the skin.   NovoLOG  FlexPen 100 UNIT/ML FlexPen Generic drug: insulin  aspart Inject 5 Units into the skin 3 (three) times daily with meals. What changed:  how much to take when to take this   ondansetron  4 MG tablet Commonly known as: ZOFRAN  Take 1 tablet (4 mg total) by mouth every 6 (six) hours as needed for nausea.   pantoprazole  40 MG tablet Commonly known as: PROTONIX  TAKE 1 TABLET BY MOUTH EVERY DAY   vancomycin  IVPB Inject 750 mg into the vein every dialysis for up to 14 days (MWF or TTS). Indication:  Polymicrobial peritonitis Last Day of Therapy:  01/16/24 Labs - Once weekly:  CBC/D and BMP, weekly pre-HD Vanc level (goal 15-25) Method of administration: Per hemodialysis protocol - to be given at HD center               Home Infusion Instuctions  (From admission, onward)           Start     Ordered   01/01/24 0000  Home infusion instructions       Comments: To be given at the hemodialysis center  Question:  Instructions  Answer:  Flushing of vascular access device: 0.9% NaCl pre/post medication administration and prn patency;  Heparin  100 u/ml, 5ml for implanted ports and Heparin  10u/ml, 5ml for all other central venous catheters.   01/01/24 1310            Follow-up Information     Center, [facility]. Go on 01/07/2024.   Why: Schedule is Tuesday, Thursday, Saturday with 11:30 am start time.  On Tuesday (5/6), please arrive at 10:30 am to complete paperwork prior to treatment. Contact information: [address] [place] [address] [phone]                Discharge Exam: Cleavon Curls Weights   01/02/24 0500 01/03/24 0530 01/05/24 0526  Weight: 72.1 kg 70.6 kg 74.2 kg   Vitals:   01/05/24 0900 01/05/24 1558  BP: (!) 146/69 133/70  Pulse: 72 67  Resp:  16  Temp: 98.3 F (36.8 C) 98.2 F (36.8 C)  SpO2: 100% 100%   Examination: Physical Exam:  Constitutional: WN/WD elderly chronically ill-appearing African-American male no acute distress sitting up in the bed Respiratory: Diminished to auscultation bilaterally with some coarse breath sounds, no wheezing, rales, rhonchi or crackles. Normal respiratory effort and patient is not tachypenic. No accessory muscle use.  Unlabored breathing Cardiovascular: RRR, no murmurs / rubs / gallops. S1 and S2 auscultated. No extremity edema.   Abdomen: Soft, non-tender, non-distended. Bowel sounds positive.  GU: Deferred. Musculoskeletal: No clubbing / cyanosis of digits/nails. No joint deformity upper and lower extremities.  Skin: No rashes, lesions, ulcers on limited skin evaluation. No induration; Warm and dry.  Neurologic: Has some blindness and density right-sided deficits but other cranial nerves II through XII are grossly intact. Psychiatric: Normal judgment and insight. Alert and oriented x 3.   Condition at discharge: stable  The results of significant diagnostics from this hospitalization (including imaging, microbiology, ancillary and laboratory) are listed below for reference.   Imaging Studies: US  Abdomen Limited RUQ  (LIVER/GB) Result Date: 01/05/2024 CLINICAL DATA:  [id number].  Abnormal liver enzymes. EXAM: ULTRASOUND ABDOMEN LIMITED RIGHT UPPER QUADRANT COMPARISON:  CT without contrast 12/27/2023 FINDINGS: Gallbladder: No gallstones or wall  thickening visualized. No sonographic Murphy sign noted by sonographer. Common bile duct: Diameter: 4.7 mm.  No intrahepatic bile duct dilatation. Liver: No focal lesion identified. Within normal limits in parenchymal echogenicity. Portal vein is patent on color Doppler imaging with normal direction of blood flow towards the liver. Other: Complex fluid with internal echoes collects along side the right lobe of the liver corresponding to the perihepatic loculated fluid on CT. IMPRESSION: 1. Complex fluid with internal echoes collecting along side the right lobe of the liver corresponding to the perihepatic loculated fluid on CT. 2. No gallstones, wall thickening or biliary dilatation. Electronically Signed   By: Denman Fischer M.D.   On: 01/05/2024 06:04   DG C-Arm 1-60 Min Result Date: 12/30/2023 CLINICAL DATA:  Dialysis catheter exchange EXAM: DG C-ARM 1-60 MIN FLUOROSCOPY: Fluoroscopy Time:  29 seconds Radiation Exposure Index (if provided by the fluoroscopic device): 4.57 mGy Number of Acquired Spot Images: 0 COMPARISON:  None Available. FINDINGS: Two intraoperative spot images demonstrate left dialysis catheter. The tip is in the right atrium. IMPRESSION: As above. Electronically Signed   By: Janeece Mechanic M.D.   On: 12/30/2023 13:05   VAS US  UPPER EXT VEIN MAPPING (PRE-OP  AVF) Result Date: 12/29/2023 UPPER EXTREMITY VEIN MAPPING Patient Name:  Laray A Fisch  Date of Exam:   12/29/2023 Medical Rec #: [id number]            Accession #:    [id number] Date of Birth: [birth year]           Patient Gender: M Patient Age:   24 years Exam [facility]  [facility] Procedure:      VAS US  UPPER EXT VEIN MAPPING (PRE-OP  AVF) Referring Phys: Hilario Lover PEEPLES  --------------------------------------------------------------------------------  Indications: Pre-access. History: ESRD, failed peritoneal dialysis secondary to infection. CVA 2016, now          with residual right hemiparesis.  Limitations: Right arm hemiparesis, IV, bandages, tissue properties, edema Comparison Study: No prior mapping on file Performing Technologist: Carleene Chase RVS  Examination Guidelines: A complete evaluation includes B-mode imaging, spectral Doppler, color Doppler, and power Doppler as needed of all accessible portions of each vessel. Bilateral testing is considered an integral part of a complete examination. Limited examinations for reoccurring indications may be performed as noted. +-----------------+-------------+----------+--------------+ Right Cephalic   Diameter (cm)Depth (cm)   Findings    +-----------------+-------------+----------+--------------+ Prox upper arm       0.16        0.40                  +-----------------+-------------+----------+--------------+ Mid upper arm        0.12        0.38                  +-----------------+-------------+----------+--------------+ Dist upper arm       0.11        0.42                  +-----------------+-------------+----------+--------------+ Antecubital fossa                       not visualized +-----------------+-------------+----------+--------------+ Prox forearm                            not visualized +-----------------+-------------+----------+--------------+ Mid forearm  not visualized +-----------------+-------------+----------+--------------+ Dist forearm                            not visualized +-----------------+-------------+----------+--------------+ Wrist                                   not visualized +-----------------+-------------+----------+--------------+ +-----------------+-------------+----------+--------------+ Right Basilic    Diameter  (cm)Depth (cm)   Findings    +-----------------+-------------+----------+--------------+ Prox upper arm                          not visualized +-----------------+-------------+----------+--------------+ Mid upper arm                           not visualized +-----------------+-------------+----------+--------------+ Dist upper arm                          not visualized +-----------------+-------------+----------+--------------+ Antecubital fossa    0.25        0.85                  +-----------------+-------------+----------+--------------+ Prox forearm         0.18        1.10                  +-----------------+-------------+----------+--------------+ Mid forearm                             not visualized +-----------------+-------------+----------+--------------+ Distal forearm                          not visualized +-----------------+-------------+----------+--------------+ Wrist                                   not visualized +-----------------+-------------+----------+--------------+ +-----------------+-------------+----------+---------------------+ Left Cephalic    Diameter (cm)Depth (cm)      Findings        +-----------------+-------------+----------+---------------------+ Prox upper arm                             not visualized     +-----------------+-------------+----------+---------------------+ Mid upper arm                              not visualized     +-----------------+-------------+----------+---------------------+ Dist upper arm                             not visualized     +-----------------+-------------+----------+---------------------+ Antecubital fossa    0.32        0.17                         +-----------------+-------------+----------+---------------------+ Prox forearm         0.18        0.29                         +-----------------+-------------+----------+---------------------+ Mid forearm           0.20        0.25                         +-----------------+-------------+----------+---------------------+  Dist forearm         0.26        0.22                         +-----------------+-------------+----------+---------------------+ Wrist                                   not visualized and IV +-----------------+-------------+----------+---------------------+ +-----------------+-------------+----------+---------------------+ Left Basilic     Diameter (cm)Depth (cm)      Findings        +-----------------+-------------+----------+---------------------+ Prox upper arm                             not visualized     +-----------------+-------------+----------+---------------------+ Mid upper arm      0.26/0.10  1.25/0.79                       +-----------------+-------------+----------+---------------------+ Dist upper arm       0.33        0.81         branching       +-----------------+-------------+----------+---------------------+ Antecubital fossa    0.29        0.64                         +-----------------+-------------+----------+---------------------+ Prox forearm         0.24        0.85                         +-----------------+-------------+----------+---------------------+ Mid forearm          0.24        1.39                         +-----------------+-------------+----------+---------------------+ Distal forearm                          not visualized and IV +-----------------+-------------+----------+---------------------+ Wrist                                   not visualized and IV +-----------------+-------------+----------+---------------------+ *See table(s) above for measurements and observations.  Diagnosing physician: Jimmye Moulds MD Electronically signed by Jimmye Moulds MD on 12/29/2023 at 6:16:56 PM.    Final    IR Fluoro Guide CV Line Left Result Date: 12/27/2023 INDICATION: Acute renal failure, no current access for  dialysis EXAM: ULTRASOUND FLUOROSCOPIC TEMPORARY LEFT IJ DIALYSIS CATHETER MEDICATIONS: 1% LIDOCAINE  LOCAL ANESTHESIA/SEDATION: Total intra-service moderate Sedation Time: None. The patient's level of consciousness and vital signs were monitored continuously by radiology nursing throughout the procedure under my direct supervision. FLUOROSCOPY: Radiation Exposure Index (as provided by the fluoroscopic device): 2.0 mGy Kerma COMPLICATIONS: None immediate. PROCEDURE: Informed written consent was obtained from the patient after a thorough discussion of the procedural risks, benefits and alternatives. All questions were addressed. Maximal Sterile Barrier Technique was utilized including caps, mask, sterile gowns, sterile gloves, sterile drape, hand hygiene and skin antiseptic. A timeout was performed prior to the initiation of the procedure. Under sterile conditions and local anesthesia, left IJ micropuncture access performed. Images obtained for documentation of the patent left IJV. 018 guidewire advanced centrally followed  by the micro dilator set. Measurements obtained for the appropriate length. Amplatz guidewire inserted. Tract dilatation performed to insert a 20 cm Mahurkar type temporary dialysis catheter. Tip position SVC RA junction. Catheter secured with silk sutures and a sterile dressing. Blood aspirated easily followed by saline and heparin  flushes. External caps applied. No immediate complication. Patient tolerated the procedure well. IMPRESSION: Successful ultrasound and fluoroscopic left IJ temporary dialysis catheter. Tip SVC RA junction. Ready for use. Electronically Signed   By: Melven Stable.  Shick M.D.   On: 12/27/2023 16:10   IR US  Guide Vasc Access Left Result Date: 12/27/2023 INDICATION: Acute renal failure, no current access for dialysis EXAM: ULTRASOUND FLUOROSCOPIC TEMPORARY LEFT IJ DIALYSIS CATHETER MEDICATIONS: 1% LIDOCAINE  LOCAL ANESTHESIA/SEDATION: Total intra-service moderate Sedation Time: None.  The patient's level of consciousness and vital signs were monitored continuously by radiology nursing throughout the procedure under my direct supervision. FLUOROSCOPY: Radiation Exposure Index (as provided by the fluoroscopic device): 2.0 mGy Kerma COMPLICATIONS: None immediate. PROCEDURE: Informed written consent was obtained from the patient after a thorough discussion of the procedural risks, benefits and alternatives. All questions were addressed. Maximal Sterile Barrier Technique was utilized including caps, mask, sterile gowns, sterile gloves, sterile drape, hand hygiene and skin antiseptic. A timeout was performed prior to the initiation of the procedure. Under sterile conditions and local anesthesia, left IJ micropuncture access performed. Images obtained for documentation of the patent left IJV. 018 guidewire advanced centrally followed by the micro dilator set. Measurements obtained for the appropriate length. Amplatz guidewire inserted. Tract dilatation performed to insert a 20 cm Mahurkar type temporary dialysis catheter. Tip position SVC RA junction. Catheter secured with silk sutures and a sterile dressing. Blood aspirated easily followed by saline and heparin  flushes. External caps applied. No immediate complication. Patient tolerated the procedure well. IMPRESSION: Successful ultrasound and fluoroscopic left IJ temporary dialysis catheter. Tip SVC RA junction. Ready for use. Electronically Signed   By: Melven Stable.  Shick M.D.   On: 12/27/2023 16:10   CT ABDOMEN PELVIS WO CONTRAST Result Date: 12/27/2023 CLINICAL DATA:  Acute, nonlocalized abdominal pain EXAM: CT ABDOMEN AND PELVIS WITHOUT CONTRAST TECHNIQUE: Multidetector CT imaging of the abdomen and pelvis was performed following the standard protocol without IV contrast. RADIATION DOSE REDUCTION: This exam was performed according to the departmental dose-optimization program which includes automated exposure control, adjustment of the mA and/or kV  according to patient size and/or use of iterative reconstruction technique. COMPARISON:  09/16/2023 FINDINGS: Lower chest: Symmetric gynecomastia. Small right more than left pleural effusion with atelectasis. There is circumferential thickening of the lower esophagus, chronic based on 2024 CT. Hepatobiliary: Scalloping of the right liver margin related 2 peritoneal based fluid. No liver mass.No evidence of biliary obstruction or stone. Pancreas: Unremarkable. Spleen: Unremarkable. Adrenals/Urinary Tract: Negative adrenals. No hydronephrosis or stone. Unremarkable bladder. Stomach/Bowel:  No obstruction. No visible bowel inflammation Vascular/Lymphatic: No acute vascular abnormality. No mass or adenopathy. Reproductive:No pathologic findings. Other: Peritoneal dialysis catheter in the lower ventral midline. Peritoneal fluid is large volume and loculated appearing mainly in the ventral abdomen with tracking superior along the right aspect of the liver where there is scalloping, cannot exclude subcapsular component at this level. Bubbles of anti dependent gas presumably introduced through the catheter. The dominant central loculation is 22 cm in transverse span and 8 cm in thickness. Musculoskeletal: No acute abnormalities. Bulky spondylitic spurring with multilevel lower thoracic ankylosis. IMPRESSION: Peritoneal dialysis with loculated peritoneal fluid collections in the ventral abdomen and tracking along the right  liver where there could be subcapsular involvement given the degree of right liver scalloping. Correlate for infected dialysate. Small volume pleural fluid with atelectasis at the right more than left base. Electronically Signed   By: Ronnette Coke M.D.   On: 12/27/2023 06:56   VAS US  UPPER EXTREMITY VENOUS DUPLEX Result Date: 12/12/2023 UPPER VENOUS STUDY  Patient Name:  Marlen A Adriano  Date of Exam:   12/11/2023 Medical Rec #: [id number]            Accession #:    [id number] Date of Birth:  1951-06-14           Patient Gender: M Patient Age:   28 years Exam Location:  [facility] Procedure:      VAS US  UPPER EXTREMITY VENOUS DUPLEX Referring Phys: [place] --------------------------------------------------------------------------------  Indications: Edema Comparison Study: No prior study Performing Technologist: Delford Felling MHA, RDMS, RVT, RDCS  Examination Guidelines: A complete evaluation includes B-mode imaging, spectral Doppler, color Doppler, and power Doppler as needed of all accessible portions of each vessel. Bilateral testing is considered an integral part of a complete examination. Limited examinations for reoccurring indications may be performed as noted.  Right Findings: +----------+------------+---------+-----------+----------+-------+ RIGHT     CompressiblePhasicitySpontaneousPropertiesSummary +----------+------------+---------+-----------+----------+-------+ IJV           Full       Yes       Yes                      +----------+------------+---------+-----------+----------+-------+ Subclavian    Full       Yes       Yes                      +----------+------------+---------+-----------+----------+-------+ Axillary      Full       Yes       Yes                      +----------+------------+---------+-----------+----------+-------+ Brachial      Full       Yes       Yes                      +----------+------------+---------+-----------+----------+-------+ Radial        Full                                          +----------+------------+---------+-----------+----------+-------+ Ulnar         Full                                          +----------+------------+---------+-----------+----------+-------+ Cephalic      Full                                          +----------+------------+---------+-----------+----------+-------+ Basilic       Full                                           +----------+------------+---------+-----------+----------+-------+  Left Findings: +----------+------------+---------+-----------+----------+-------+ LEFT  CompressiblePhasicitySpontaneousPropertiesSummary +----------+------------+---------+-----------+----------+-------+ Subclavian               Yes       Yes                      +----------+------------+---------+-----------+----------+-------+  Summary:  Right: No evidence of deep vein thrombosis in the upper extremity. No evidence of superficial vein thrombosis in the upper extremity.  Left: No evidence of thrombosis in the subclavian.  *See table(s) above for measurements and observations.  Diagnosing physician: Delaney Fearing Electronically signed by Delaney Fearing on 12/12/2023 at 10:46:59 AM.    Final    Microbiology: Results for orders placed or performed during the hospital encounter of 12/27/23  Culture, blood (routine x 2)     Status: None   Collection Time: 12/27/23  7:27 AM   Specimen: BLOOD RIGHT HAND  Result Value Ref Range Status   Specimen Description BLOOD RIGHT HAND  Final   Special Requests   Final    BOTTLES DRAWN AEROBIC ONLY Blood Culture results may not be optimal due to an inadequate volume of blood received in culture bottles   Culture   Final    NO GROWTH 5 DAYS Performed at [facility], [address] [place], [place] [address]    Report Status 01/01/2024 FINAL  Final  Culture, body fluid w Gram Stain-bottle     Status: Abnormal   Collection Time: 12/27/23  5:30 PM   Specimen: Peritoneal Washings  Result Value Ref Range Status   Specimen Description PERITONEAL  Final   Special Requests DIALYSIS  Final   Gram Stain   Final    GRAM POSITIVE COCCI IN CLUSTERS IN BOTH AEROBIC AND ANAEROBIC BOTTLES CRITICAL RESULT CALLED TO, READ BACK BY AND VERIFIED WITH: A CECIL,RN@[id number]  12/28/23 MK Performed at [facility], [address] [place], [place] [address]    Culture ENTEROCOCCUS FAECALIS  (A)  Final   Report Status 12/31/2023 FINAL  Final   Organism ID, Bacteria ENTEROCOCCUS FAECALIS  Final      Susceptibility   Enterococcus faecalis - MIC*    AMPICILLIN <=2 SENSITIVE Sensitive     VANCOMYCIN  1 SENSITIVE Sensitive     GENTAMICIN  SYNERGY SENSITIVE Sensitive     * ENTEROCOCCUS FAECALIS  Gram stain     Status: None   Collection Time: 12/27/23  5:30 PM   Specimen: Peritoneal Washings  Result Value Ref Range Status   Specimen Description PERITONEAL  Final   Special Requests DIALYSIS  Final   Gram Stain   Final    WBC PRESENT, PREDOMINANTLY PMN NO ORGANISMS SEEN CYTOSPIN SMEAR Performed at [facility], [address], [place], [place] [address]    Report Status 12/27/2023 FINAL  Final  Culture, blood (routine x 2)     Status: None   Collection Time: 12/27/23  7:22 PM   Specimen: BLOOD  Result Value Ref Range Status   Specimen Description BLOOD BLOOD LEFT ARM  Final   Special Requests   Final    BOTTLES DRAWN AEROBIC AND ANAEROBIC Blood Culture adequate volume   Culture   Final    NO GROWTH 5 DAYS Performed at [facility], [address], [place], [place] [address]    Report Status 01/01/2024 FINAL  Final  Surgical PCR screen     Status: None   Collection Time: 12/29/23  9:37 PM   Specimen: Nasal Mucosa; Nasal Swab  Result Value Ref  Range Status   MRSA, PCR NEGATIVE NEGATIVE Final   Staphylococcus aureus NEGATIVE NEGATIVE Final    Comment: (NOTE) The Xpert SA Assay (FDA approved for NASAL specimens in patients 29 years of age and older), is one component of a comprehensive surveillance program. It is not intended to diagnose infection nor to guide or monitor treatment. Performed at [facility], [address], [place], [place] [address]    Labs: CBC: Recent Labs  Lab 12/31/23 [id number] 01/01/24 0650 01/02/24 0942 01/03/24 0922 01/04/24 0950  WBC 7.8 8.7 8.0 7.3 7.8  NEUTROABS 5.3 6.1 4.7 4.5 4.7  HGB 6.9* 8.6* 9.0* 9.1* 8.3*  HCT  21.3* 26.2* 28.3* 27.9* 26.5*  MCV 81.0 83.2 86.0 85.3 87.2  PLT 292 296 283 265 281   Basic Metabolic Panel: Recent Labs  Lab 12/31/23 0608 01/01/24 0649 01/01/24 0650 01/02/24 0942 01/03/24 0922 01/04/24 0950  NA 135  --  133* 134* 131* 132*  K 3.4*  --  3.9 4.0 4.2 4.3  CL 97*  --  100 101 97* 99  CO2 28  --  24 22 22 24   GLUCOSE 242*  --  204* 220* 311* 189*  BUN 42*  --  28* 35* 20 34*  CREATININE 3.62*  --  2.48* 2.96* 2.43* 3.65*  CALCIUM  6.8*  --  7.4* 8.0* 7.7* 8.0*  MG  --   --   --  1.6*  --  1.8  PHOS  --  2.3*  --  2.9  --  3.0   Liver Function Tests: Recent Labs  Lab 12/30/23 0438 01/02/24 0942 01/04/24 0950  AST 22 52* 79*  ALT 19 26 79*  ALKPHOS 89 86 96  BILITOT 0.9 0.8 0.7  PROT 6.3* 6.3* 6.2*  ALBUMIN  1.8* 1.8* 1.7*   CBG: Recent Labs  Lab 01/04/24 1638 01/04/24 2010 01/05/24 0805 01/05/24 1129 01/05/24 1556  GLUCAP 169* 178* 114* 110* 161*   Discharge time spent: greater than 30 minutes.  Signed: Aura Leeds, DO [facility] 01/05/2024

## 2024-01-05 NOTE — Progress Notes (Signed)
 [facility] Progress Note   Subjective:   Report feeling well, denies SOB, CP, dizziness, nausea. Tolerated HD in chair yesterday.   Objective Vitals:   01/04/24 2012 01/05/24 0526 01/05/24 0826 01/05/24 0900  BP: 130/63 138/72 137/67 (!) 146/69  Pulse: 78 71 69 72  Resp: 18 18 17    Temp: 98.3 F (36.8 C) 98.3 F (36.8 C) 98.2 F (36.8 C) 98.3 F (36.8 C)  TempSrc: Oral Oral Oral Oral  SpO2: 100% 100% 100% 100%  Weight:  74.2 kg    Height:       Physical Exam General: Alert male in NAD Heart: RRR, no murmurs, rubs or gallops  Lungs:CTA bilaterally, respirations unlabored Abdomen: Soft, non-distended, +BS Extremities: No edema b/l lower extremities Dialysis Access:  [facility] with intact bandage    Additional Objective Labs: Basic Metabolic Panel: Recent Labs  Lab 01/01/24 0649 01/01/24 0650 01/02/24 0942 01/03/24 0922 01/04/24 0950  NA  --    < > 134* 131* 132*  K  --    < > 4.0 4.2 4.3  CL  --    < > 101 97* 99  CO2  --    < > 22 22 24   GLUCOSE  --    < > 220* 311* 189*  BUN  --    < > 35* 20 34*  CREATININE  --    < > 2.96* 2.43* 3.65*  CALCIUM   --    < > 8.0* 7.7* 8.0*  PHOS 2.3*  --  2.9  --  3.0   < > = values in this interval not displayed.   Liver Function Tests: Recent Labs  Lab 12/30/23 0438 01/02/24 0942 01/04/24 0950  AST 22 52* 79*  ALT 19 26 79*  ALKPHOS 89 86 96  BILITOT 0.9 0.8 0.7  PROT 6.3* 6.3* 6.2*  ALBUMIN  1.8* 1.8* 1.7*   No results for input(s): "LIPASE", "AMYLASE" in the last 168 hours. CBC: Recent Labs  Lab 12/31/23 0608 01/01/24 0650 01/02/24 0942 01/03/24 0922 01/04/24 0950  WBC 7.8 8.7 8.0 7.3 7.8  NEUTROABS 5.3 6.1 4.7 4.5 4.7  HGB 6.9* 8.6* 9.0* 9.1* 8.3*  HCT 21.3* 26.2* 28.3* 27.9* 26.5*  MCV 81.0 83.2 86.0 85.3 87.2  PLT 292 296 283 265 281   Blood Culture    Component Value Date/Time   SDES BLOOD BLOOD LEFT ARM 12/27/2023 1922   SPECREQUEST  12/27/2023 1922    BOTTLES DRAWN AEROBIC AND  ANAEROBIC Blood Culture adequate volume   CULT  12/27/2023 1922    NO GROWTH 5 DAYS Performed at [facility], [address], [place], [place] [address]    REPTSTATUS 01/01/2024 FINAL 12/27/2023 1922    Cardiac Enzymes: No results for input(s): "CKTOTAL", "CKMB", "CKMBINDEX", "TROPONINI" in the last 168 hours. CBG: Recent Labs  Lab 01/04/24 0742 01/04/24 1440 01/04/24 1638 01/04/24 2010 01/05/24 0805  GLUCAP 163* 78 169* 178* 114*   Iron Studies: No results for input(s): "IRON", "TIBC", "TRANSFERRIN", "FERRITIN" in the last 72 hours. @lablastinr3 @ Studies/Results: US  Abdomen Limited RUQ (LIVER/GB) Result Date: 01/05/2024 CLINICAL DATA:  [id number].  Abnormal liver enzymes. EXAM: ULTRASOUND ABDOMEN LIMITED RIGHT UPPER QUADRANT COMPARISON:  CT without contrast 12/27/2023 FINDINGS: Gallbladder: No gallstones or wall thickening visualized. No sonographic Murphy sign noted by sonographer. Common bile duct: Diameter: 4.7 mm.  No intrahepatic bile duct dilatation. Liver: No focal lesion identified. Within normal limits in parenchymal echogenicity. Portal vein is patent on color Doppler imaging with  normal direction of blood flow towards the liver. Other: Complex fluid with internal echoes collects along side the right lobe of the liver corresponding to the perihepatic loculated fluid on CT. IMPRESSION: 1. Complex fluid with internal echoes collecting along side the right lobe of the liver corresponding to the perihepatic loculated fluid on CT. 2. No gallstones, wall thickening or biliary dilatation. Electronically Signed   By: Denman Fischer M.D.   On: 01/05/2024 06:04   Medications:  ceFEPime  (MAXIPIME ) IV 1 g (01/04/24 2249)   vancomycin  1,000 mg (01/04/24 1518)    amLODipine   5 mg Oral Daily   buPROPion   300 mg Oral q morning   calcitRIOL   0.5 mcg Oral Daily   Chlorhexidine  Gluconate Cloth  6 each Topical Q0600   darbepoetin (ARANESP ) injection - NON-DIALYSIS  40 mcg Subcutaneous Q  Tue-1800   gabapentin   300 mg Oral QHS   gentamicin  cream  1 Application Topical Daily   heparin  injection (subcutaneous)  5,000 Units Subcutaneous Q8H   insulin  aspart  0-20 Units Subcutaneous TID WC   insulin  aspart  2 Units Subcutaneous TID WC   insulin  glargine-yfgn  22 Units Subcutaneous QHS   losartan   100 mg Oral Daily   metoprolol  tartrate  50 mg Oral BID   pantoprazole   40 mg Oral Daily   potassium chloride   20 mEq Oral Daily   sodium chloride  flush  10-40 mL Intracatheter Q12H   sodium chloride  flush  3 mL Intravenous Q12H    Dialysis Orders: prior PD patient  Assessment/Plan: Recurrent peritonitis status post PD catheter removed 12/30/23 transitioning  to hemodialysis.  On IV antibiotics per ID,  vancomycin  750 mg  and  2 gm cefepime  post hd for a total of 3 weeks, stop date 01/16/24.  ESRD - HD TTS. Dr Cindra Cree ordered vein mapping, eventually will need graft versus fistula Anemia of ESRD-Hgb 9.1, s/p 1 unit PRBC. Continue weekly aranesp . Holding IV Fe for now with ongoing infection Secondary hyperparathyroidism -corrected calcium  ok, phos at goal Hypokalemia- currently at goal on potassium chloride  40mEq daily HTN/volume -BP currently stable, continue current medications. Amlodipine  was reduced to 5mg  to prevent hypotension on HD. Appears euvolemic on exam.     Ramona Burner, PA-C 01/05/2024, 11:24 AM  [facility] Pager: [phone]

## 2024-01-06 ENCOUNTER — Telehealth: Payer: Self-pay

## 2024-01-06 ENCOUNTER — Telehealth (HOSPITAL_COMMUNITY): Payer: Self-pay | Admitting: Pharmacy Technician

## 2024-01-06 NOTE — Progress Notes (Signed)
 Late Note Entry- Jan 06, 2024  Pt was d/c to snf yesterday. Contacted [facility] this morning to be advised of pt's d/c date and that pt should start tomorrow as planned.  Lauraine Polite Renal Navigator [phone]

## 2024-01-06 NOTE — Telephone Encounter (Signed)
 Copied from CRM [id number]. Topic: General - Other >> Jan 06, 2024  3:53 PM Freya Jesus wrote: Reason for CRM: Patient spouse calling to provide fax number from previous call CRM # [phone]  [facility]:  [phone] [facility]: [phone] [facility]: [phone]   Patient spouse was advised he will be discharged Thursday May 8th. Referral form must have last hospital visit notes, current medications, demographic sheet and referral for services. Provider must say he needs custodial benefits, Home Health care and will need a prescription for the hospital bed.  Callback: [phone]

## 2024-01-06 NOTE — Telephone Encounter (Signed)
 Pharmacy Patient Advocate Encounter   Received notification from Inpatient Request that prior authorization for Ondansetron  HCl 4MG  tablets is required/requested.   Insurance verification completed.   The patient is insured through [employer] ADVANTAGE/RX ADVANCE .   Per test claim: PA required; PA submitted to above mentioned insurance via CoverMyMeds Key/confirmation #/EOC [id number] Status is pending

## 2024-01-06 NOTE — Telephone Encounter (Signed)
 Pharmacy Patient Advocate Encounter  Received notification from [facility] ADVANTAGE/RX ADVANCE that Prior Authorization for Ondansetron HCl 4MG  tablets  has been DENIED.  Full denial letter will be uploaded to the media tab. See denial reason below.

## 2024-01-07 ENCOUNTER — Ambulatory Visit: Payer: PPO | Admitting: Podiatry

## 2024-01-07 DIAGNOSIS — Z111 Encounter for screening for respiratory tuberculosis: Secondary | ICD-10-CM | POA: Diagnosis not present

## 2024-01-07 DIAGNOSIS — T8249XD Other complication of vascular dialysis catheter, subsequent encounter: Secondary | ICD-10-CM | POA: Diagnosis not present

## 2024-01-07 DIAGNOSIS — E1122 Type 2 diabetes mellitus with diabetic chronic kidney disease: Secondary | ICD-10-CM | POA: Diagnosis not present

## 2024-01-07 DIAGNOSIS — K652 Spontaneous bacterial peritonitis: Secondary | ICD-10-CM | POA: Diagnosis not present

## 2024-01-07 DIAGNOSIS — Z992 Dependence on renal dialysis: Secondary | ICD-10-CM | POA: Diagnosis not present

## 2024-01-07 DIAGNOSIS — N2581 Secondary hyperparathyroidism of renal origin: Secondary | ICD-10-CM | POA: Diagnosis not present

## 2024-01-07 DIAGNOSIS — D689 Coagulation defect, unspecified: Secondary | ICD-10-CM | POA: Diagnosis not present

## 2024-01-07 DIAGNOSIS — N186 End stage renal disease: Secondary | ICD-10-CM | POA: Diagnosis not present

## 2024-01-07 DIAGNOSIS — D631 Anemia in chronic kidney disease: Secondary | ICD-10-CM | POA: Diagnosis not present

## 2024-01-08 NOTE — Telephone Encounter (Signed)
 I notified pt of message below. I advised pt to let his wife know the update. Pt stated that he had to call back. Will try and call spouse again.

## 2024-01-08 NOTE — Telephone Encounter (Signed)
**Note De-identified  [place] Obfuscation** Please advise 

## 2024-01-09 NOTE — Telephone Encounter (Signed)
 Pt spouse stated that someone advised her that the social worker will help. So everything has been set up. No further action needed.

## 2024-01-14 DIAGNOSIS — D631 Anemia in chronic kidney disease: Secondary | ICD-10-CM | POA: Diagnosis not present

## 2024-01-14 DIAGNOSIS — I6302 Cerebral infarction due to thrombosis of basilar artery: Secondary | ICD-10-CM | POA: Diagnosis not present

## 2024-01-14 DIAGNOSIS — G8191 Hemiplegia, unspecified affecting right dominant side: Secondary | ICD-10-CM | POA: Diagnosis not present

## 2024-01-14 DIAGNOSIS — R279 Unspecified lack of coordination: Secondary | ICD-10-CM | POA: Diagnosis not present

## 2024-01-14 DIAGNOSIS — M6281 Muscle weakness (generalized): Secondary | ICD-10-CM | POA: Diagnosis not present

## 2024-01-14 DIAGNOSIS — D689 Coagulation defect, unspecified: Secondary | ICD-10-CM | POA: Diagnosis not present

## 2024-01-14 DIAGNOSIS — E785 Hyperlipidemia, unspecified: Secondary | ICD-10-CM | POA: Diagnosis not present

## 2024-01-14 DIAGNOSIS — R2689 Other abnormalities of gait and mobility: Secondary | ICD-10-CM | POA: Diagnosis not present

## 2024-01-14 DIAGNOSIS — K659 Peritonitis, unspecified: Secondary | ICD-10-CM | POA: Diagnosis not present

## 2024-01-14 DIAGNOSIS — K652 Spontaneous bacterial peritonitis: Secondary | ICD-10-CM | POA: Diagnosis not present

## 2024-01-14 DIAGNOSIS — N2581 Secondary hyperparathyroidism of renal origin: Secondary | ICD-10-CM | POA: Diagnosis not present

## 2024-01-14 DIAGNOSIS — Z992 Dependence on renal dialysis: Secondary | ICD-10-CM | POA: Diagnosis not present

## 2024-01-14 DIAGNOSIS — I10 Essential (primary) hypertension: Secondary | ICD-10-CM | POA: Diagnosis not present

## 2024-01-14 DIAGNOSIS — T8249XD Other complication of vascular dialysis catheter, subsequent encounter: Secondary | ICD-10-CM | POA: Diagnosis not present

## 2024-01-14 DIAGNOSIS — N186 End stage renal disease: Secondary | ICD-10-CM | POA: Diagnosis not present

## 2024-01-15 ENCOUNTER — Encounter (HOSPITAL_COMMUNITY): Payer: Self-pay | Admitting: *Deleted

## 2024-01-15 ENCOUNTER — Emergency Department (HOSPITAL_COMMUNITY)
Admission: EM | Admit: 2024-01-15 | Discharge: 2024-01-16 | Disposition: A | Discharge status: Home / Self Care | Attending: Emergency Medicine | Admitting: Emergency Medicine

## 2024-01-15 ENCOUNTER — Other Ambulatory Visit: Payer: Self-pay

## 2024-01-15 DIAGNOSIS — Z794 Long term (current) use of insulin: Secondary | ICD-10-CM | POA: Insufficient documentation

## 2024-01-15 DIAGNOSIS — R935 Abnormal findings on diagnostic imaging of other abdominal regions, including retroperitoneum: Secondary | ICD-10-CM | POA: Diagnosis not present

## 2024-01-15 DIAGNOSIS — Z87898 Personal history of other specified conditions: Secondary | ICD-10-CM | POA: Insufficient documentation

## 2024-01-15 DIAGNOSIS — Z7982 Long term (current) use of aspirin: Secondary | ICD-10-CM | POA: Insufficient documentation

## 2024-01-15 DIAGNOSIS — E8809 Other disorders of plasma-protein metabolism, not elsewhere classified: Secondary | ICD-10-CM | POA: Diagnosis not present

## 2024-01-15 DIAGNOSIS — K449 Diaphragmatic hernia without obstruction or gangrene: Secondary | ICD-10-CM | POA: Diagnosis not present

## 2024-01-15 DIAGNOSIS — Z79899 Other long term (current) drug therapy: Secondary | ICD-10-CM | POA: Diagnosis not present

## 2024-01-15 DIAGNOSIS — K2100 Gastro-esophageal reflux disease with esophagitis, without bleeding: Secondary | ICD-10-CM

## 2024-01-15 DIAGNOSIS — K51211 Ulcerative (chronic) proctitis with rectal bleeding: Secondary | ICD-10-CM | POA: Diagnosis not present

## 2024-01-15 DIAGNOSIS — E46 Unspecified protein-calorie malnutrition: Secondary | ICD-10-CM | POA: Diagnosis not present

## 2024-01-15 DIAGNOSIS — R1013 Epigastric pain: Secondary | ICD-10-CM | POA: Diagnosis not present

## 2024-01-15 DIAGNOSIS — I10 Essential (primary) hypertension: Secondary | ICD-10-CM | POA: Diagnosis not present

## 2024-01-15 DIAGNOSIS — K6289 Other specified diseases of anus and rectum: Secondary | ICD-10-CM | POA: Diagnosis not present

## 2024-01-15 LAB — CBC WITH DIFFERENTIAL/PLATELET
Abs Immature Granulocytes: 0.02 10*3/uL (ref 0.00–0.07)
Basophils Absolute: 0 10*3/uL (ref 0.0–0.1)
Basophils Relative: 1 %
Eosinophils Absolute: 0 10*3/uL (ref 0.0–0.5)
Eosinophils Relative: 0 %
HCT: 31.1 % — ABNORMAL LOW (ref 39.0–52.0)
Hemoglobin: 10 g/dL — ABNORMAL LOW (ref 13.0–17.0)
Immature Granulocytes: 0 %
Lymphocytes Relative: 11 %
Lymphs Abs: 0.8 10*3/uL (ref 0.7–4.0)
MCH: 27.4 pg (ref 26.0–34.0)
MCHC: 32.2 g/dL (ref 30.0–36.0)
MCV: 85.2 fL (ref 80.0–100.0)
Monocytes Absolute: 0.7 10*3/uL (ref 0.1–1.0)
Monocytes Relative: 10 %
Neutro Abs: 5.2 10*3/uL (ref 1.7–7.7)
Neutrophils Relative %: 78 %
Platelets: 355 10*3/uL (ref 150–400)
RBC: 3.65 MIL/uL — ABNORMAL LOW (ref 4.22–5.81)
RDW: 19.3 % — ABNORMAL HIGH (ref 11.5–15.5)
WBC: 6.7 10*3/uL (ref 4.0–10.5)
nRBC: 0 % (ref 0.0–0.2)

## 2024-01-15 LAB — COMPREHENSIVE METABOLIC PANEL WITH GFR
ALT: 24 U/L (ref 0–44)
AST: 22 U/L (ref 15–41)
Albumin: 2.3 g/dL — ABNORMAL LOW (ref 3.5–5.0)
Alkaline Phosphatase: 134 U/L — ABNORMAL HIGH (ref 38–126)
Anion gap: 12 (ref 5–15)
BUN: 16 mg/dL (ref 8–23)
CO2: 30 mmol/L (ref 22–32)
Calcium: 8.3 mg/dL — ABNORMAL LOW (ref 8.9–10.3)
Chloride: 97 mmol/L — ABNORMAL LOW (ref 98–111)
Creatinine, Ser: 2.59 mg/dL — ABNORMAL HIGH (ref 0.61–1.24)
GFR, Estimated: 26 mL/min — ABNORMAL LOW (ref >60)
Glucose, Bld: 199 mg/dL — ABNORMAL HIGH (ref 70–99)
Potassium: 3.3 mmol/L — ABNORMAL LOW (ref 3.5–5.1)
Sodium: 139 mmol/L (ref 135–145)
Total Bilirubin: 0.6 mg/dL (ref 0.0–1.2)
Total Protein: 8.1 g/dL (ref 6.5–8.1)

## 2024-01-15 LAB — LIPASE, BLOOD: Lipase: 25 U/L (ref 11–51)

## 2024-01-15 MED ORDER — ONDANSETRON 4 MG PO TBDP
4.0000 mg | ORAL_TABLET | Freq: Once | ORAL | Status: AC
  Administered 2024-01-15: 4 mg via ORAL
  Filled 2024-01-15: qty 1

## 2024-01-15 NOTE — ED Triage Notes (Signed)
 The pt is hemodialysis and has a dialysis catheter in his lt upper chest he was dialyzed yesterday

## 2024-01-15 NOTE — ED Triage Notes (Signed)
 The pt was just discharged from this hospital may 5th  for the past 2 days his abd pain has been increasing with nausea  and vomiting not eating for 2 days

## 2024-01-15 NOTE — ED Provider Triage Note (Signed)
 Emergency Medicine Provider Triage Evaluation Note  Russell Austin , a 73 y.o. male  was evaluated in triage.  Pt complains of abdominal pain with nausea and vomiting.  Recently admitted for peritonitis.  Currently taking oral vancomycin .  Currently on hemodialysis.  Review of Systems  Positive: Nausea, vomiting, generalized abdominal pain, constipation (last bowel movement Sunday) Negative: Hematemesis, blood in stool, chest pain, shortness of breath, fever, chills  Physical Exam  There were no vitals taken for this visit. Gen:   Awake, no distress, uncomfortable appearing and actively retching Resp:  Normal effort  MSK:   Moves extremities without difficulty  Other:    Medical Decision Making  Medically screening exam initiated at 9:49 PM.  Appropriate orders placed.  Russell Austin was informed that the remainder of the evaluation will be completed by another provider, this initial triage assessment does not replace that evaluation, and the importance of remaining in the ED until their evaluation is complete.  Labs ordered   Carie Charity, Kirby Peoples 01/15/24 2152

## 2024-01-16 ENCOUNTER — Emergency Department (HOSPITAL_COMMUNITY)

## 2024-01-16 ENCOUNTER — Ambulatory Visit: Admitting: Adult Health

## 2024-01-16 DIAGNOSIS — K449 Diaphragmatic hernia without obstruction or gangrene: Secondary | ICD-10-CM | POA: Diagnosis not present

## 2024-01-16 DIAGNOSIS — R935 Abnormal findings on diagnostic imaging of other abdominal regions, including retroperitoneum: Secondary | ICD-10-CM | POA: Diagnosis not present

## 2024-01-16 LAB — URINALYSIS, ROUTINE W REFLEX MICROSCOPIC
Bacteria, UA: NONE SEEN
Bilirubin Urine: NEGATIVE
Glucose, UA: 150 mg/dL — AB
Hgb urine dipstick: NEGATIVE
Ketones, ur: NEGATIVE mg/dL
Leukocytes,Ua: NEGATIVE
Nitrite: NEGATIVE
Protein, ur: >=300 mg/dL — AB
Specific Gravity, Urine: 1.011 (ref 1.005–1.030)
pH: 8 (ref 5.0–8.0)

## 2024-01-16 MED ORDER — FAMOTIDINE 20 MG PO TABS
20.0000 mg | ORAL_TABLET | Freq: Two times a day (BID) | ORAL | 0 refills | Status: DC
Start: 2024-01-16 — End: 2024-01-25

## 2024-01-16 MED ORDER — PANTOPRAZOLE SODIUM 40 MG PO TBEC
40.0000 mg | DELAYED_RELEASE_TABLET | Freq: Every day | ORAL | Status: DC
  Administered 2024-01-16: 40 mg via ORAL
  Filled 2024-01-16: qty 1

## 2024-01-16 MED ORDER — SUCRALFATE 1 GM/10ML PO SUSP: 1.0000 g | Freq: Three times a day (TID) | ORAL | 0 refills | Status: DC

## 2024-01-16 MED ORDER — PANTOPRAZOLE SODIUM 40 MG PO TBEC: 40.0000 mg | DELAYED_RELEASE_TABLET | Freq: Every day | ORAL | 0 refills | Status: DC

## 2024-01-16 MED ORDER — OXYCODONE-ACETAMINOPHEN 5-325 MG PO TABS
1.0000 | ORAL_TABLET | Freq: Once | ORAL | Status: AC
  Administered 2024-01-16: 1 via ORAL
  Filled 2024-01-16: qty 1

## 2024-01-16 MED ORDER — LIDOCAINE VISCOUS HCL 2 % MT SOLN
15.0000 mL | Freq: Once | OROMUCOSAL | Status: AC
  Administered 2024-01-16: 15 mL via ORAL
  Filled 2024-01-16: qty 15

## 2024-01-16 MED ORDER — PANTOPRAZOLE SODIUM 40 MG PO TBEC: 40.0000 mg | DELAYED_RELEASE_TABLET | Freq: Every day | ORAL | Status: DC

## 2024-01-16 MED ORDER — ALUM & MAG HYDROXIDE-SIMETH 200-200-20 MG/5ML PO SUSP
30.0000 mL | Freq: Once | ORAL | Status: AC
Start: 2024-01-16 — End: 2024-01-16
  Administered 2024-01-16: 30 mL via ORAL
  Filled 2024-01-16: qty 30

## 2024-01-16 NOTE — Progress Notes (Deleted)
 Subjective:    Patient ID: Russell Austin, male    DOB: 09-Oct-1950, 73 y.o.   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  He presents to the office today with his wife for TCM visit  Admit Date 12/27/2023 Discharge from [facility] 01/04/2024 Discharge from Rehab   He presented to the emergency room on 12/27/2023 at the request of nephrology after his PD fluid check showed gram-negative cocci bacilli, rare gram-positive cocci in pairs and clusters with resistance to ampicillin, cefazolin .    Hospital Course  Sepsis 2/2 Polymicrobial Dialysate  - He had a CT of the abdomen pelvis which showed loculated peritoneal fluid collections in the ventral abdomen tracking along the right liver with subscapular involvement given degree of right liver scalloping and small volume pleural fluid without ectasis more on the right and left base.  Peritoneal dialysis catheter was removed. -Gram-negative coccobacilli/gram-positive cocci pair/cluster on 4/15--growing Enterococcus 4/25--IV cefepime /vancomycin  for Duration 3 weeks per ID with end date of 01/16/24. BC x2 negative from 4/25   Anemia Blood loss expected secondary to bleeding from left trialysis jugular site - He was given desmopressin  x 2 on 12/27/2023.  Hemoglobin was below 7 on 12/31/2023 but no acute blood loss.  Transfused with HD 1 unit PRBC.  His hemoglobin hematocrit trend was fairly stable but did drop from 9.1-8.3 on last check. - Aspirin  81 mg was held at this time advised to resume as outpatient once all bleeding risk over  ESRD - Trialysis catheter placed on 12/27/2023 for dialysis.  Phosphorus level was low at 3.0 and calcium  was 8.0. - He requires  outpatient set up for hemodialysis  Hyponatremia  - Mild hyponatremia during admission 133-130 4-1 31-1 32 on last check.  Hypomagnesia - Mag was repleted with mag sulfate 2 g via IV.  Essential Hypertension  - Nephrology changes meds to amlodipine  5 mg p.o. daily, losartan  100 mg p.o. daily and metoprolol  titrate 50 mg p.o. twice daily.  HLD - Statin 10 mg daily was held given abnormal LFTs  Mildly Elevated LFT/Transaminates  - Thought likely due to infection.  AST trended up from 45-79 on last check during admission and ALT trended up from 38-79. - Did have a right upper quadrant ultrasound that showed complex fluid with internal echoes collecting along side the right lobe of the liver corresponding with perihepatic loculated fluid on CT.  No gallstones, wall thickening, or biliary dilation. - Was recommend getting outpatient IR evaluation for possible drain placement since he was being DC'd to skilled nursing facility. - ID was to follow-up with the patient within the coming weeks and will ensure IR will see the patient at some point.  Thrombocytosis - Improved and resolved upon discharge.  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  He has been using a wheelchair for the past 3 months and is debilitated. - PT/OT recommended skilled nursing facility  GERD/GI prophylactics - He was continued on pantoprazole  40 mg daily  Hypoalbuminemia - Since albumin  was 1.7 upon discharge   Review of Systems  Objective:   Physical Exam        Assessment & Plan:

## 2024-01-16 NOTE — ED Notes (Signed)
IV attempted x2 without success.

## 2024-01-16 NOTE — ED Provider Notes (Signed)
 [name] Provider Note   CSN: [id number] Arrival date & time: 01/15/24  2141     History  Chief Complaint  Patient presents with   Abdominal Pain    Russell Austin is a 73 y.o. male.  73 year old male who presents with his wife secondary to decreased appetite and abdominal pain.  Patient states that this all started over the last couple days.  Went to dialysis yesterday open he would get better and it did not.  Patient was recently mated to hospital for bacterial peritonitis.  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  He has been afebrile but has had decreased appetite and some mild abdominal bloating.  Patient is points towards his epigastric and suprapubic area when asked where it hurts.  No vomiting.  His last bowel movement yesterday was normal.   Abdominal Pain      Home Medications Prior to Admission medications   Medication Sig Start Date End Date Taking? Authorizing Provider  famotidine (PEPCID) 20 MG tablet Take 1 tablet (20 mg total) by mouth 2 (two) times daily for 7 days. 01/16/24 01/23/24 Yes Izekiel Flegel, Reymundo Caulk, MD  sucralfate (CARAFATE) 1 GM/10ML suspension Take 10 mLs (1 g total) by mouth 4 (four) times daily -  with meals and at bedtime. 01/16/24  Yes Jennesis Ramaswamy, Reymundo Caulk, MD  acetaminophen  (TYLENOL ) 325 MG tablet Take 2 tablets (650 mg total) by mouth every 6 (six) hours as needed for mild pain (pain score 1-3) (or Fever >/= 101). 01/04/24   Aura Leeds Latif, DO  albuterol  (PROVENTIL ) (2.5 MG/3ML) 0.083% nebulizer solution Take 3 mLs (2.5 mg total) by nebulization every 6 (six) hours as needed for wheezing or shortness of breath. 01/04/24   Aura Leeds Latif, DO  amLODipine  (NORVASC ) 5 MG tablet Take 1 tablet (5 mg total) by mouth daily. 06/11/23   Nafziger, Randel Buss, NP  aspirin  EC 81 MG tablet Take 81 mg by mouth daily.    [provider]  atorvastatin  (LIPITOR) 10 MG tablet Take 1 tablet (10 mg total) by mouth every morning. 05/30/23   Nafziger, Randel Buss, NP  buPROPion  (WELLBUTRIN  XL) 300 MG 24 hr tablet TAKE 1 TABLET (300 MG TOTAL) BY MOUTH EVERY MORNING. 11/26/23   Nafziger, Randel Buss, NP  calcitRIOL  (ROCALTROL ) 0.5 MCG capsule Take 1 capsule (0.5 mcg total) by mouth daily. 01/04/24   Sheikh, Omair Latif, DO  chlorproMAZINE  (THORAZINE ) 25 MG tablet TAKE 1 TABLET BY MOUTH THREE TIMES A DAY Patient taking differently: Take 25 mg by mouth daily as needed for hiccoughs. 11/21/23   Nafziger, Randel Buss, NP  Continuous Glucose Sensor (FREESTYLE LIBRE 3 PLUS SENSOR) MISC Change sensor every 15 days. 07/09/23   Nafziger, Randel Buss, NP  gabapentin  (NEURONTIN ) 300 MG capsule TAKE ONE CAPSULE BY MOUTH EVERYDAY AT BEDTIME 05/30/23   Nafziger, Randel Buss, NP  gentamicin  cream (GARAMYCIN ) 0.1 % Apply 1 Application topically daily. 04/23/23   [provider]  insulin  aspart (NOVOLOG  FLEXPEN) 100 UNIT/ML FlexPen Inject 5 Units into the skin 3 (three) times daily with meals. Patient taking differently: Inject 8 Units into the skin daily. 06/26/23   Nafziger, Randel Buss, NP  insulin  glargine (LANTUS  SOLOSTAR) 100 UNIT/ML Solostar Pen INJECT 20 UNITS INTO THE SKIN DAILY Patient taking differently: Inject 18 Units into the skin every evening. 12/05/23   Nafziger, Cory, NP  Insulin  Pen Needle (B-D ULTRAFINE III  SHORT PEN) 31G X 8 MM MISC Use to inject insulin  4 times per day as instructed 10/02/23   Nafziger, Randel Buss, NP  lactulose (CHRONULAC) 10 GM/15ML solution Take by mouth daily as needed for mild constipation or moderate constipation. 05/15/23   [provider]  losartan  (COZAAR ) 100 MG tablet TAKE 1 TABLET BY MOUTH EVERY DAY IN THE MORNING 11/26/23   Nafziger, Randel Buss, NP  Methoxy PEG-Epoetin Beta (MIRCERA IJ) Inject into the skin. 08/22/23   [provider]  metoCLOPramide  (REGLAN ) 10 MG tablet TAKE 1 TABLET BY MOUTH 4 TIMES DAILY. Patient taking  differently: Take 10 mg by mouth daily as needed for nausea or vomiting. 12/05/23   Nafziger, Randel Buss, NP  metoprolol  tartrate (LOPRESSOR ) 50 MG tablet Take 50 mg by mouth 2 (two) times daily. 09/07/23   [provider]  ondansetron  (ZOFRAN ) 4 MG tablet Take 1 tablet (4 mg total) by mouth every 6 (six) hours as needed for nausea. 01/04/24   Aura Leeds Latif, DO  pantoprazole  (PROTONIX ) 40 MG tablet Take 1 tablet (40 mg total) by mouth daily. 01/16/24   Shalev Helminiak, Reymundo Caulk, MD      Allergies    Lactose intolerance (gi), Apple pectin [pectin], Metformin  and related, Peach [prunus persica], and Morphine  and codeine    Review of Systems   Review of Systems  Gastrointestinal:  Positive for abdominal pain.    Physical Exam Updated Vital Signs BP (!) 189/94   Pulse (!) 101   Temp 98 F (36.7 C) (Oral)   Resp 13   Ht 5\' 8"  (1.727 m)   Wt 74.2 kg   SpO2 100%   BMI 24.87 kg/m  Physical Exam Vitals and nursing note reviewed.  Constitutional:      Appearance: He is well-developed.  HENT:     Head: Normocephalic and atraumatic.  Cardiovascular:     Rate and Rhythm: Normal rate.  Pulmonary:     Effort: Pulmonary effort is normal. No respiratory distress.  Abdominal:     General: There is no distension.     Tenderness: There is abdominal tenderness in the epigastric area and suprapubic area.     Hernia: No hernia is present.  Musculoskeletal:        General: Normal range of motion.     Cervical back: Normal range of motion.  Skin:    General: Skin is warm and dry.  Neurological:     Mental Status: He is alert.     ED Results / Procedures / Treatments   Labs (all labs ordered are listed, but only abnormal results are displayed) Labs Reviewed  COMPREHENSIVE METABOLIC PANEL WITH GFR - Abnormal; Notable for the following components:      Result Value   Potassium 3.3 (*)    Chloride 97 (*)    Glucose, Bld 199 (*)    Creatinine, Ser 2.59 (*)    Calcium  8.3 (*)    Albumin  2.3 (*)     Alkaline Phosphatase 134 (*)    GFR, Estimated 26 (*)    All other components within normal limits  CBC WITH DIFFERENTIAL/PLATELET - Abnormal; Notable for the following components:   RBC 3.65 (*)    Hemoglobin 10.0 (*)    HCT 31.1 (*)    RDW 19.3 (*)    All other components within normal limits  URINALYSIS, ROUTINE W REFLEX MICROSCOPIC - Abnormal; Notable for the following components:   Glucose, UA 150 (*)    Protein, ur >=300 (*)  All other components within normal limits  LIPASE, BLOOD    EKG None  Radiology CT Renal Stone Study Result Date: 01/16/2024 CLINICAL DATA:  Abdominal/flank pain. Stone suspected. History of renal failure on peritoneal dialysis. Peritoneal dialysis catheter was removed 12/30/2023 and intravascular dialysis catheter inserted. EXAM: CT ABDOMEN AND PELVIS WITHOUT CONTRAST TECHNIQUE: Multidetector CT imaging of the abdomen and pelvis was performed following the standard protocol without IV contrast. RADIATION DOSE REDUCTION: This exam was performed according to the departmental dose-optimization program which includes automated exposure control, adjustment of the mA and/or kV according to patient size and/or use of iterative reconstruction technique. COMPARISON:  CTs without contrast 12/27/2023 and 09/16/2023. FINDINGS: Lower chest: Bilateral subareolar gynecomastia. There is new demonstration of a partially visualized double-lumen catheter presumably for dialysis, extending through the right atrium and terminating in the intrahepatic IVC. The cardiac size is normal. Small pericardial effusion again seen anteriorly. Small hiatal hernia with increased thickening in the distal thoracic esophagus. Endoscopy is recommended. Lung bases are clear of infiltrates. There is mild posterior atelectasis. Hepatobiliary: A lentiform fluid collection along the superolateral aspect of the liver deforms the liver capsule but is smaller than previously. There is air in the fluid which  was seen previously. The liver is otherwise unremarkable without contrast. The gallbladder and bile ducts are unremarkable. There is loss of fine detail through the liver due to overlying wires and external metallic monitoring hardware. Pancreas: Partially atrophic. Otherwise unremarkable without contrast. Spleen: Unremarkable without contrast.  No splenomegaly. Adrenals/Urinary Tract: There is no adrenal mass. Both kidneys slightly small in length both measuring 8.5 cm in length. There is no contour deforming mass of either of the unenhanced kidneys and no urinary stone or obstruction. The bladder is unremarkable for the degree of distension. Stomach/Bowel: Unremarkable stomach. Unremarkable small bowel. An appendix is not seen. There is interval worsening circumferential wall thickening in the rectum concerning for proctitis, not present on the prior studies. Infiltrating disease is less likely given the short interval time frame of its appearance. Rest of the colon wall is normal in thickness. Vascular/Lymphatic: Aortic atherosclerosis. No enlarged abdominal or pelvic lymph nodes. Reproductive: No prostatomegaly. Other: In the ventral hypogastrium, a loculated fluid collection is again noted and smaller than previously, today measuring 17.2 x 3.9 cm on 3:66, at a similar level previously was 23 by 7.8 cm and no longer contains air. This is connected over a thin fluid-filled channel extending over the anterior right upper quadrant and along lateral aspect of the liver up to a lentiform perihepatic collection extending over the dome and lateral aspect of the right lobe, on 3:13 measuring 8.4 x 4.1 cm, previously 12.3 x 4.9 cm. There are pockets of air in the anterior aspect of this second collection and a more well-defined wall around it. No free air is seen outside of these collections and no other abdominal or pelvic fluid. No abdominal wall hernia. Musculoskeletal: Advanced bridging enthesopathy thoracic and  lumbar spine. Moderate hip DJD. Small calcified disc extrusion L3-4 centrally. IMPRESSION: 1. Interval worsening circumferential wall thickening in the rectum concerning for proctitis. Infiltrating disease is less likely given the short interval time frame of its appearance. 2. Thinly connected loculated fluid collections in the ventral hypogastrium and perihepatic region, smaller than previously. 3. There are pockets of air in the anterior aspect of the perihepatic collection, with infectious process not excluded. The perihepatic collection deforms the liver capsule but not as much as previously. 4. No urinary stone or obstruction.  The 5. Small hiatal hernia with increased thickening in the distal thoracic esophagus. Endoscopy is recommended. 6. Aortic atherosclerosis. 7. New demonstration of a partially visualized double-lumen catheter presumably for dialysis, extending through the right atrium and terminating in the intrahepatic IVC. Aortic Atherosclerosis (ICD10-I70.0). Electronically Signed   By: Denman Fischer M.D.   On: 01/16/2024 03:38    Procedures Procedures    Medications Ordered in ED Medications  pantoprazole  (PROTONIX ) EC tablet 40 mg (40 mg Oral Not Given 01/16/24 0247)  ondansetron  (ZOFRAN -ODT) disintegrating tablet 4 mg (4 mg Oral Given 01/15/24 2159)  alum & mag hydroxide-simeth (MAALOX/MYLANTA) 200-200-20 MG/5ML suspension 30 mL (30 mLs Oral Given 01/16/24 0210)    And  lidocaine  (XYLOCAINE ) 2 % viscous mouth solution 15 mL (15 mLs Oral Given 01/16/24 0210)  oxyCODONE -acetaminophen  (PERCOCET/ROXICET) 5-325 MG per tablet 1 tablet (1 tablet Oral Given 01/16/24 0341)    ED Course/ Medical Decision Making/ A&P                                 Medical Decision Making Amount and/or Complexity of Data Reviewed Radiology: ordered.  Risk OTC drugs. Prescription drug management.  Patient is afebrile and does not have real significant distention on exam with no fluid wave.  Bedside  ultrasound utilized without any obvious large amount of peritoneal fluid.  As his symptoms also could be consistent with indigestion or heartburn/peptic ulcer disease will give some meds to see if that helps.  He had urine in his bladder and was tender over his bladder so also consider UTI versus retention.  He will try to urinate we will get a postvoid residual to see if he needs a catheter as that could also be related to his symptoms.  Patient has a known hepatic abscess.  If he is not feeling better and/or these things do not seem to pan out as possible causes will probably need a CT scan to reevaluate with abscess looks like to make sure it has not ruptured and/or gotten worse.  Patient did have some improvement with his medications.  CT scan x-ray shows improving abscess and possibly some new inflammation around his rectum.  Patient having normal bowel movements of low suspicion for infection since he is on vancomycin  and cefepime  at this time.  Also no pain with bowel movements.  I suspect that his symptoms are more related to indigestion/possible PUD.  Will increase his at home antiacids, long discussion with him and his wife about ways to try to increase his protein intake although his albumin  is better than it was previously.  Close follow-up with gastroenterology recommended they will call later today.  He has hypertension here but his dialysis is today so he will follow-up with that as expected to improve.  No other issues requiring emergent evaluation or attention at this time.   Final Clinical Impression(s) / ED Diagnoses Final diagnoses:  Epigastric pain  Hypertension, unspecified type  Rectal inflammation  History of abdominal abscess  Hypoalbuminemia  Malnutrition, unspecified type (HCC)    Rx / DC Orders ED Discharge Orders          Ordered    pantoprazole  (PROTONIX ) 40 MG tablet  Daily        01/16/24 0420    famotidine (PEPCID) 20 MG tablet  2 times daily        01/16/24  0420    sucralfate (CARAFATE) 1 GM/10ML suspension  3  times daily with meals & bedtime        01/16/24 0420              Elijha Dedman, Reymundo Caulk, MD 01/16/24 [phone]

## 2024-01-16 NOTE — ED Notes (Signed)
 Pt taken to CT.

## 2024-01-17 ENCOUNTER — Other Ambulatory Visit (HOSPITAL_COMMUNITY): Payer: Self-pay

## 2024-01-17 ENCOUNTER — Ambulatory Visit (INDEPENDENT_AMBULATORY_CARE_PROVIDER_SITE_OTHER): Admitting: Adult Health

## 2024-01-17 VITALS — BP 180/100 | HR 100 | Temp 98.6°F

## 2024-01-17 DIAGNOSIS — R112 Nausea with vomiting, unspecified: Secondary | ICD-10-CM

## 2024-01-17 DIAGNOSIS — K219 Gastro-esophageal reflux disease without esophagitis: Secondary | ICD-10-CM

## 2024-01-17 DIAGNOSIS — T8571XA Infection and inflammatory reaction due to peritoneal dialysis catheter, initial encounter: Secondary | ICD-10-CM

## 2024-01-17 DIAGNOSIS — D649 Anemia, unspecified: Secondary | ICD-10-CM

## 2024-01-17 DIAGNOSIS — Z8673 Personal history of transient ischemic attack (TIA), and cerebral infarction without residual deficits: Secondary | ICD-10-CM

## 2024-01-17 DIAGNOSIS — E11618 Type 2 diabetes mellitus with other diabetic arthropathy: Secondary | ICD-10-CM | POA: Diagnosis not present

## 2024-01-17 DIAGNOSIS — I10 Essential (primary) hypertension: Secondary | ICD-10-CM

## 2024-01-17 DIAGNOSIS — R748 Abnormal levels of other serum enzymes: Secondary | ICD-10-CM | POA: Diagnosis not present

## 2024-01-17 DIAGNOSIS — D75839 Thrombocytosis, unspecified: Secondary | ICD-10-CM

## 2024-01-17 DIAGNOSIS — E1140 Type 2 diabetes mellitus with diabetic neuropathy, unspecified: Secondary | ICD-10-CM

## 2024-01-17 DIAGNOSIS — Z794 Long term (current) use of insulin: Secondary | ICD-10-CM | POA: Diagnosis not present

## 2024-01-17 DIAGNOSIS — J45909 Unspecified asthma, uncomplicated: Secondary | ICD-10-CM | POA: Diagnosis not present

## 2024-01-17 DIAGNOSIS — E785 Hyperlipidemia, unspecified: Secondary | ICD-10-CM

## 2024-01-17 DIAGNOSIS — K659 Peritonitis, unspecified: Secondary | ICD-10-CM | POA: Diagnosis not present

## 2024-01-17 DIAGNOSIS — E8809 Other disorders of plasma-protein metabolism, not elsewhere classified: Secondary | ICD-10-CM | POA: Diagnosis not present

## 2024-01-17 DIAGNOSIS — D631 Anemia in chronic kidney disease: Secondary | ICD-10-CM | POA: Diagnosis not present

## 2024-01-17 DIAGNOSIS — N186 End stage renal disease: Secondary | ICD-10-CM | POA: Diagnosis not present

## 2024-01-17 DIAGNOSIS — Z992 Dependence on renal dialysis: Secondary | ICD-10-CM | POA: Diagnosis not present

## 2024-01-17 DIAGNOSIS — E871 Hypo-osmolality and hyponatremia: Secondary | ICD-10-CM | POA: Diagnosis not present

## 2024-01-17 DIAGNOSIS — Z556 Problems related to health literacy: Secondary | ICD-10-CM | POA: Diagnosis not present

## 2024-01-17 DIAGNOSIS — I120 Hypertensive chronic kidney disease with stage 5 chronic kidney disease or end stage renal disease: Secondary | ICD-10-CM | POA: Diagnosis not present

## 2024-01-17 DIAGNOSIS — E1122 Type 2 diabetes mellitus with diabetic chronic kidney disease: Secondary | ICD-10-CM | POA: Diagnosis not present

## 2024-01-17 LAB — CBC
HCT: 29.3 % — ABNORMAL LOW (ref 39.0–52.0)
Hemoglobin: 9.7 g/dL — ABNORMAL LOW (ref 13.0–17.0)
MCHC: 33.1 g/dL (ref 30.0–36.0)
MCV: 83.1 fl (ref 78.0–100.0)
Platelets: 325 10*3/uL (ref 150.0–400.0)
RBC: 3.53 Mil/uL — ABNORMAL LOW (ref 4.22–5.81)
RDW: 20.4 % — ABNORMAL HIGH (ref 11.5–15.5)
WBC: 6.4 10*3/uL (ref 4.0–10.5)

## 2024-01-17 LAB — COMPREHENSIVE METABOLIC PANEL WITH GFR
ALT: 16 U/L (ref 0–53)
AST: 15 U/L (ref 0–37)
Albumin: 3 g/dL — ABNORMAL LOW (ref 3.5–5.2)
Alkaline Phosphatase: 146 U/L — ABNORMAL HIGH (ref 39–117)
BUN: 19 mg/dL (ref 6–23)
CO2: 32 meq/L (ref 19–32)
Calcium: 7.8 mg/dL — ABNORMAL LOW (ref 8.4–10.5)
Chloride: 96 meq/L (ref 96–112)
Creatinine, Ser: 2.5 mg/dL — ABNORMAL HIGH (ref 0.40–1.50)
GFR: 25.06 mL/min — ABNORMAL LOW (ref >60.00)
Glucose, Bld: 179 mg/dL — ABNORMAL HIGH (ref 70–99)
Potassium: 3.2 meq/L — ABNORMAL LOW (ref 3.5–5.1)
Sodium: 138 meq/L (ref 135–145)
Total Bilirubin: 0.7 mg/dL (ref 0.2–1.2)
Total Protein: 7.4 g/dL (ref 6.0–8.3)

## 2024-01-17 LAB — PHOSPHORUS: Phosphorus: 2.1 mg/dL — ABNORMAL LOW (ref 2.3–4.6)

## 2024-01-17 LAB — MAGNESIUM: Magnesium: 1.7 mg/dL (ref 1.5–2.5)

## 2024-01-17 MED ORDER — PROMETHAZINE HCL 12.5 MG PO TABS: 12.5000 mg | ORAL_TABLET | Freq: Three times a day (TID) | ORAL | 2 refills | Status: DC | PRN

## 2024-01-17 MED ORDER — ONDANSETRON HCL 4 MG PO TABS: 4.0000 mg | ORAL_TABLET | Freq: Three times a day (TID) | ORAL | 3 refills | Status: DC | PRN

## 2024-01-17 MED ORDER — ONDANSETRON HCL 4 MG/2ML IJ SOLN
4.0000 mg | Freq: Once | INTRAMUSCULAR | Status: AC
  Administered 2024-01-17: 4 mg via INTRAMUSCULAR

## 2024-01-17 NOTE — Progress Notes (Signed)
 Subjective:    Patient ID: Russell Austin, male    DOB: May 01, 1951, 73 y.o.   MRN: [id number]  HPI  He presents to the office today with his wife for TCM visit   Admit Date 12/27/2023 Discharge from [facility] 01/04/2024 Discharge from Rehab 01/09/2024  He presented to the emergency room on 12/27/2023 at the request of nephrology after his PD fluid check showed gram-negative cocci bacilli, rare gram-positive cocci in pairs and clusters with resistance to ampicillin, cefazolin .    Hospital Course  Sepsis 2/2 Polymicrobial Dialysate  - He had a CT of the abdomen pelvis which showed loculated peritoneal fluid collections in the ventral abdomen tracking along the right liver with subscapular involvement given degree of right liver scalloping and small volume pleural fluid without ectasis more on the right and left base.  Peritoneal dialysis catheter was removed. -Gram-negative coccobacilli/gram-positive cocci pair/cluster on 4/15--growing Enterococcus 4/25--IV cefepime /vancomycin  for Duration 3 weeks per ID with end date of 01/16/24. BC x2 negative from 4/25   Anemia Blood loss expected secondary to bleeding from left trialysis jugular site - He was given desmopressin  x 2 on 12/27/2023.  Hemoglobin was below 7 on 12/31/2023 but no acute blood loss.  Transfused with HD 1 unit PRBC.  His hemoglobin hematocrit trend was fairly stable but did drop from 9.1-8.3 on last check. - Aspirin  81 mg was held at this time advised to resume as outpatient once all bleeding risk over  ESRD - Trialysis catheter placed on 12/27/2023 for dialysis.  Phosphorus level was low at 3.0 and calcium  was 8.0. - He requires outpatient set up for hemodialysis  - Going to HD on Tuesday/Thursday/Saturday.   Hyponatremia  - Mild hyponatremia during admission 133-130 4-1 31-1 32 on last check.  Hypomagnesia - Mag was repleted with mag sulfate 2 g via IV.  Essential Hypertension  - Nephrology changes meds to amlodipine  5 mg  p.o. daily, losartan  100 mg p.o. daily and metoprolol  titrate 50 mg p.o. twice daily.  HLD - Statin 10 mg daily was held given abnormal LFTs  Mildly Elevated LFT/Transaminates  - Thought likely due to infection.  AST trended up from 45-79 on last check during admission and ALT trended up from 38-79. - Did have a right upper quadrant ultrasound that showed complex fluid with internal echoes collecting along side the right lobe of the liver corresponding with perihepatic loculated fluid on CT.  No gallstones, wall thickening, or biliary dilation. - Was recommend getting outpatient IR evaluation for possible drain placement since he was being DC'd to skilled nursing facility. - ID was to follow-up with the patient within the coming weeks and will ensure IR will see the patient at some point.  Thrombocytosis - Improved and resolved upon discharge.  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  He has been using a wheelchair for the past 3 months and is debilitated. - PT/OT recommended skilled nursing facility  GERD/GI prophylactics - He was continued on pantoprazole  40 mg daily  Hypoalbuminemia - Since albumin  was 1.7 upon discharge  After being discharged from his skilled nursing facility he presented to the emergency room a day and a half ago due to decreased appetite and abdominal pain.  His exam and labs are reassuring suspected that his symptoms were due to indigestion/possible PUD.  His wife  reports that while at the skilled nursing facility they discontinued his GERD medications.  In the ER they sent in Pepcid, Carafate liquid and Protonix .  He was given a GI cocktail while in the ER and reported improvement in that.  They have not picked up her prescriptions at the pharmacy just yet but plan to do so today.  Patient reports that he is  nauseous and has had an episode of vomiting since being discharged from the ER.  He has not had any fevers or chills since being discharged from the hospital.  Overall he does not feel well, complains of nausea, vomiting, decreased appetite, epigastric pain, weakness, and fatigue.  He has continued on all medications since being discharged.   Review of Systems  Constitutional:  Positive for appetite change and fatigue.  Respiratory: Negative.    Cardiovascular: Negative.   Gastrointestinal:  Positive for abdominal pain, nausea and vomiting.  Genitourinary: Negative.   Musculoskeletal: Negative.   Skin:  Positive for color change.  Neurological:  Positive for weakness.  Psychiatric/Behavioral: Negative.     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   Chronic kidney disease    Diabetes mellitus type II    Diabetic retinopathy FOLLOWED BY DR Seward Dao   ED (erectile dysfunction)    Glaucoma of both eyes    Hypertension    Left hydrocele    Stroke [facility])     Social History   Socioeconomic History   Marital status: Married    Spouse name: Not on file   Number of children: 1   Years of education: 12   Highest education level: 12th grade  Occupational History   Occupation: Disabled  Tobacco Use   Smoking status: Never   Smokeless tobacco: Never  Vaping Use   Vaping status: Never Used  Substance and Sexual Activity   Alcohol use: No   Drug use: No   Sexual activity: Not on file  Other Topics Concern   Not on file  Social History Narrative   Lives at home with his wife and granddaughter.   Left-handed.   3 cups caffeine per day.   Social Drivers of Corporate investment banker Strain: Low Risk  (01/21/2023)   Overall Financial Resource Strain (CARDIA)    Difficulty of Paying Living Expenses: Not hard at all  Food Insecurity: No Food Insecurity (12/27/2023)   Hunger Vital Sign    Worried About Running Out  of Food in the Last Year: Never true    Ran Out of Food in the Last Year: Never true  Transportation Needs: No Transportation Needs (12/27/2023)   PRAPARE - Administrator, Civil Service (Medical): No    Lack of Transportation (Non-Medical): No  Physical Activity: Inactive (01/21/2023)   Exercise Vital Sign    Days of Exercise per Week: 0 days    Minutes of Exercise per Session: 0 min  Stress: No Stress Concern Present (01/21/2023)   [employer] of Occupational Health - Occupational Stress Questionnaire    Feeling of Stress : Not at all  Social Connections: Socially Integrated (12/27/2023)   Social Connection and Isolation Panel [NHANES]    Frequency of Communication with Friends and Family: More than three times a week    Frequency of Social Gatherings with Friends and Family: More than three times a week    Attends Religious Services: 1 to 4 times per year  Active Member of Clubs or Organizations: No    Attends Banker Meetings: 1 to 4 times per year    Marital Status: Married  Catering manager Violence: Not At Risk (12/27/2023)   Humiliation, Afraid, Rape, and Kick questionnaire    Fear of Current or Ex-Partner: No    Emotionally Abused: No    Physically Abused: No    Sexually Abused: No    Past Surgical History:  Procedure Laterality Date   APPENDECTOMY  AGE EARLY 20'S   CAPD INSERTION N/A 04/11/2023   Procedure: LAPAROSCOPIC INSERTION CONTINUOUS AMBULATORY PERITONEAL DIALYSIS  (CAPD) CATHETER WITH  OMENTOPEXY;  Surgeon: Carlene Che, MD;  Location: [facility] OR;  Service: Vascular;  Laterality: N/A;   CAPD REMOVAL N/A 12/30/2023   Procedure: CONTINUOUS AMBULATORY PERITONEAL DIALYSIS  (CAPD) CATHETER REMOVAL;  Surgeon: Kayla Part, MD;  Location[facility];  Service: Vascular;  Laterality: N/A;   COLONOSCOPY  12/30/2020   every 5 years   HYDROCELE EXCISION  03/31/2012   Procedure: HYDROCELECTOMY ADULT;  Surgeon: Trent Frizzle, MD;  Location: [facility];  Service: Urology;  Laterality: Left;  45 MINS     INSERTION OF DIALYSIS CATHETER N/A 12/30/2023   Procedure: EXCHANGE OF DIALYSIS CATHETER;  Surgeon: Kayla Part, MD;  Location: [facility] OR;  Service: Vascular;  Laterality: N/A;   IR FLUORO GUIDE CV LINE LEFT  12/27/2023   IR US  GUIDE VASC ACCESS LEFT  12/27/2023   LEFT EYE LASER RETINA REPAIR  SEPT 2012   RIGHT EYE VITRECTOMY/ INSERTION GLAUCOMA SETON/ LASER REPAIR  12-13-2008   RETINAL ARTERY OCCLUSION /NEOVASCULAR GLAUCOMA/ HEMORRHAGE   RIGHT EYE VITRETOMY/ INSERTION GLAUCOMA SETON X2/ LASER  03-24-2009   RECURRENT HEMORRHAGE/ OCCLUSION INTERNAL SETON   SHOULDER ARTHROSCOPY Right 2005   undescended right testicle removed  1994    Family History  Problem Relation Age of Onset   Glaucoma Mother    Diabetes Mother    Stomach cancer Maternal Grandfather    Stroke Maternal Grandmother     Allergies  Allergen Reactions   Lactose Intolerance (Gi) Diarrhea   Apple Pectin [Pectin] Itching    ITCHY THROAT   Metformin  And Related Other (See Comments)    D/t decreased kidney function    Peach [Prunus Persica] Itching    ITCHY THROAT   Morphine  And Codeine Anxiety    Jittery    Current Outpatient Medications on File Prior to Visit  Medication Sig Dispense Refill   acetaminophen  (TYLENOL ) 325 MG tablet Take 2 tablets (650 mg total) by mouth every 6 (six) hours as needed for mild pain (pain score 1-3) (or Fever >/= 101). 20 tablet 0   albuterol  (PROVENTIL ) (2.5 MG/3ML) 0.083% nebulizer solution Take 3 mLs (2.5 mg total) by nebulization every 6 (six) hours as needed for wheezing or shortness of breath. 75 mL 12   amLODipine  (NORVASC ) 5 MG tablet Take 1 tablet (5 mg total) by mouth daily. 90 tablet 3   aspirin  EC 81 MG tablet Take 81 mg by mouth daily.     buPROPion  (WELLBUTRIN  XL) 300 MG 24 hr tablet TAKE 1 TABLET (300 MG TOTAL) BY MOUTH EVERY MORNING. 90 tablet 1   calcitRIOL  (ROCALTROL ) 0.5 MCG capsule Take 1  capsule (0.5 mcg total) by mouth daily.     chlorproMAZINE  (THORAZINE ) 25 MG tablet TAKE 1 TABLET BY MOUTH THREE TIMES A DAY (Patient taking differently: Take 25 mg by mouth daily as needed for hiccoughs.) 270 tablet 1  Continuous Glucose Sensor (FREESTYLE LIBRE 3 PLUS SENSOR) MISC Change sensor every 15 days. 6 each 3   famotidine (PEPCID) 20 MG tablet Take 1 tablet (20 mg total) by mouth 2 (two) times daily for 7 days. 14 tablet 0   gabapentin  (NEURONTIN ) 300 MG capsule TAKE ONE CAPSULE BY MOUTH EVERYDAY AT BEDTIME 90 capsule 3   gentamicin  cream (GARAMYCIN ) 0.1 % Apply 1 Application topically daily.     insulin  aspart (NOVOLOG  FLEXPEN) 100 UNIT/ML FlexPen Inject 5 Units into the skin 3 (three) times daily with meals. (Patient taking differently: Inject 8 Units into the skin daily.) 15 mL 1   insulin  glargine (LANTUS  SOLOSTAR) 100 UNIT/ML Solostar Pen INJECT 20 UNITS INTO THE SKIN DAILY (Patient taking differently: Inject 18 Units into the skin every evening.) 15 mL 0   Insulin  Pen Needle (B-D ULTRAFINE III SHORT PEN) 31G X 8 MM MISC Use to inject insulin  4 times per day as instructed 100 each 11   lactulose (CHRONULAC) 10 GM/15ML solution Take by mouth daily as needed for mild constipation or moderate constipation.     losartan  (COZAAR ) 100 MG tablet TAKE 1 TABLET BY MOUTH EVERY DAY IN THE MORNING 90 tablet 1   Methoxy PEG-Epoetin Beta (MIRCERA IJ) Inject into the skin.     metoCLOPramide  (REGLAN ) 10 MG tablet TAKE 1 TABLET BY MOUTH 4 TIMES DAILY. (Patient taking differently: Take 10 mg by mouth daily as needed for nausea or vomiting.) 120 tablet 0   metoprolol  tartrate (LOPRESSOR ) 50 MG tablet Take 50 mg by mouth 2 (two) times daily.     pantoprazole  (PROTONIX ) 40 MG tablet Take 1 tablet (40 mg total) by mouth daily. 90 tablet 0   sucralfate (CARAFATE) 1 GM/10ML suspension Take 10 mLs (1 g total) by mouth 4 (four) times daily -  with meals and at bedtime. 420 mL 0   [Paused] atorvastatin   (LIPITOR) 10 MG tablet Take 1 tablet (10 mg total) by mouth every morning. 90 tablet 3   No current facility-administered medications on file prior to visit.    BP (!) 180/100   Pulse 100   Temp 98.6 F (37 C) (Oral)   SpO2 97%       Objective:   Physical Exam Vitals and nursing note reviewed.  Constitutional:      Appearance: He is ill-appearing (chronically).  Cardiovascular:     Rate and Rhythm: Normal rate and regular rhythm.     Pulses: Normal pulses.     Heart sounds: Normal heart sounds.  Pulmonary:     Effort: Pulmonary effort is normal.     Breath sounds: Normal breath sounds.  Musculoskeletal:        General: Normal range of motion.  Skin:    General: Skin is dry.  Neurological:     General: No focal deficit present.     Mental Status: He is alert and oriented to person, place, and time.  Psychiatric:        Mood and Affect: Mood normal.        Behavior: Behavior normal.        Thought Content: Thought content normal.        Judgment: Judgment normal.        Assessment & Plan:  1. Infection associated with peritoneal dialysis catheter, initial encounter [facility]) (Primary) - Reviewed hospital notes, discharge instructions, labs, imaging, and medication changes with the patient and his wife.  All questions answered to the best of my ability -  CBC; Future - Magnesium ; Future - Phosphorus; Future - Comprehensive metabolic panel with GFR; Future - Comprehensive metabolic panel with GFR - Phosphorus - Magnesium  - CBC  2. Acute anemia  - CBC; Future - Magnesium ; Future - Phosphorus; Future - Phosphorus - Magnesium  - CBC  3. ESRD on dialysis [facility]) - Continue with dialysis on T/Th/Sat - CBC; Future - Magnesium ; Future - Phosphorus; Future - Comprehensive metabolic panel with GFR; Future - Comprehensive metabolic panel with GFR - Phosphorus - Magnesium  - CBC  4. Hyponatremia - Recheck labs today  - CBC; Future - Magnesium ; Future - Phosphorus;  Future - Comprehensive metabolic panel with GFR; Future - Comprehensive metabolic panel with GFR - Phosphorus - Magnesium  - CBC  5. Hypomagnesemia - Recheck labs today  - CBC; Future - Magnesium ; Future - Phosphorus; Future - Comprehensive metabolic panel with GFR; Future - Comprehensive metabolic panel with GFR - Phosphorus - Magnesium  - CBC  6. Essential hypertension - elevated today. Keep an eye on BP at home. No medication changes currently.  - CBC; Future - Magnesium ; Future - Phosphorus; Future - Comprehensive metabolic panel with GFR; Future - Comprehensive metabolic panel with GFR - Phosphorus - Magnesium  - CBC  7. Hyperlipidemia, unspecified hyperlipidemia type - Consider restarting statin  - CBC; Future - Magnesium ; Future - Phosphorus; Future - Comprehensive metabolic panel with GFR; Future - Comprehensive metabolic panel with GFR - Phosphorus - Magnesium  - CBC  8. Elevated liver enzymes - likely due to sepsis. Will recheck labs today  - He will follow up with IR and ID as directed.  - CBC; Future - Magnesium ; Future - Phosphorus; Future - Comprehensive metabolic panel with GFR; Future - Comprehensive metabolic panel with GFR - Phosphorus - Magnesium  - CBC  9. Thrombocytosis  - CBC; Future - Magnesium ; Future - Phosphorus; Future - Comprehensive metabolic panel with GFR; Future - Comprehensive metabolic panel with GFR - Phosphorus - Magnesium  - CBC  10. Type 2 diabetes mellitus with diabetic neuropathy, with long-term current use of insulin  (HCC) - Continue with current insulin  therapy, may need to decrease insulin  until appetite picks back up.  - CBC; Future - Magnesium ; Future - Phosphorus; Future - Comprehensive metabolic panel with GFR; Future - Comprehensive metabolic panel with GFR - Phosphorus - Magnesium  - CBC  11. History of CVA (cerebrovascular accident)  - CBC; Future - Magnesium ; Future - Phosphorus; Future -  Comprehensive metabolic panel with GFR; Future - Comprehensive metabolic panel with GFR - Phosphorus - Magnesium  - CBC  12. Gastroesophageal reflux disease without esophagitis - Pick up medications from pharmacy and restart  - CBC; Future - Magnesium ; Future - Phosphorus; Future - Comprehensive metabolic panel with GFR; Future - Comprehensive metabolic panel with GFR - Phosphorus - Magnesium  - CBC  13. Hypoalbuminemia  - CBC; Future - Magnesium ; Future - Phosphorus; Future - Comprehensive metabolic panel with GFR; Future - Comprehensive metabolic panel with GFR - Phosphorus - Magnesium  - CBC  14. Nausea and vomiting, unspecified vomiting type -His insurance will not cover Zofran  orally.  Will give him a shot of Zofran  in the office today and prescribe Phenergan  that he can take as needed for nausea and vomiting.  - promethazine  (PHENERGAN ) 12.5 MG tablet; Take 1 tablet (12.5 mg total) by mouth every 8 (eight) hours as needed for nausea or vomiting.  Dispense: 20 tablet; Refill: 2 - ondansetron  (ZOFRAN ) injection 4 mg  Sumayyah Custodio, NP

## 2024-01-20 ENCOUNTER — Telehealth: Payer: Self-pay | Admitting: *Deleted

## 2024-01-20 NOTE — Progress Notes (Signed)
 Complex Care Management Note Care Guide Note  01/20/2024 Name: Russell Austin MRN: [id number] DOB: [birth year]   Complex Care Management Outreach Attempts: An unsuccessful telephone outreach was attempted today to offer the patient information about available complex care management services.  Follow Up Plan:  Additional outreach attempts will be made to offer the patient complex care management information and services.   Encounter Outcome:  No Answer  Kandis Ormond, CMA [place]  [facility], [facility] Guide Direct Dial: [phone]  Fax: [phone] Website: [email or web address]

## 2024-01-21 ENCOUNTER — Other Ambulatory Visit: Payer: Self-pay | Admitting: Adult Health

## 2024-01-21 ENCOUNTER — Telehealth: Payer: Self-pay | Admitting: Adult Health

## 2024-01-21 ENCOUNTER — Ambulatory Visit: Payer: Self-pay | Admitting: Adult Health

## 2024-01-21 DIAGNOSIS — N2581 Secondary hyperparathyroidism of renal origin: Secondary | ICD-10-CM | POA: Diagnosis not present

## 2024-01-21 DIAGNOSIS — T8249XD Other complication of vascular dialysis catheter, subsequent encounter: Secondary | ICD-10-CM | POA: Diagnosis not present

## 2024-01-21 DIAGNOSIS — D689 Coagulation defect, unspecified: Secondary | ICD-10-CM | POA: Diagnosis not present

## 2024-01-21 DIAGNOSIS — D631 Anemia in chronic kidney disease: Secondary | ICD-10-CM | POA: Diagnosis not present

## 2024-01-21 DIAGNOSIS — Z992 Dependence on renal dialysis: Secondary | ICD-10-CM | POA: Diagnosis not present

## 2024-01-21 DIAGNOSIS — N186 End stage renal disease: Secondary | ICD-10-CM | POA: Diagnosis not present

## 2024-01-21 NOTE — Telephone Encounter (Signed)
 Copied from CRM [id number]. Topic: Clinical - Home Health Verbal Orders >> Jan 21, 2024  9:39 AM Annice Kim wrote: [facility]: [phone] Service Requested: Physical Therapy Frequency: 1 week 1 2 week 1 2 week 4  Any new concerns about the patient? No  Asking for Social worker

## 2024-01-21 NOTE — Progress Notes (Signed)
 Complex Care Management Note Care Guide Note  01/21/2024 Name: Russell Austin MRN: [id number] DOB: 1951-04-24   Complex Care Management Outreach Attempts: A second unsuccessful outreach was attempted today to offer the patient with information about available complex care management services.  Follow Up Plan:  Additional outreach attempts will be made to offer the patient complex care management information and services.   Encounter Outcome:  No Answer  Kandis Ormond, CMA [place]  [facility], [facility] Guide Direct Dial: [phone]  Fax: [phone] Website: [email or web address]

## 2024-01-22 MED ORDER — POTASSIUM CHLORIDE CRYS ER 20 MEQ PO TBCR: 20.0000 meq | EXTENDED_RELEASE_TABLET | Freq: Every day | ORAL | 3 refills | Status: DC

## 2024-01-22 NOTE — Telephone Encounter (Signed)
 Verbal orders given to Cic[place].

## 2024-01-22 NOTE — Telephone Encounter (Signed)
Okay for verbal orders? Please advise 

## 2024-01-22 NOTE — Progress Notes (Signed)
 Complex Care Management Note Care Guide Note  01/22/2024 Name: Russell Austin MRN: [id number] DOB: [birth year]   Complex Care Management Outreach Attempts: A third unsuccessful outreach was attempted today to offer the patient with information about available complex care management services.  Follow Up Plan:  No further outreach attempts will be made at this time. We have been unable to contact the patient to offer or enroll patient in complex care management services.  Encounter Outcome:  No Answer  Kandis Ormond, CMA [place]  [facility], [facility] Guide Direct Dial: [phone]  Fax: [phone] Website: [email or web address]

## 2024-01-23 ENCOUNTER — Encounter

## 2024-01-23 ENCOUNTER — Encounter (HOSPITAL_COMMUNITY)

## 2024-01-23 ENCOUNTER — Other Ambulatory Visit (HOSPITAL_COMMUNITY)

## 2024-01-25 ENCOUNTER — Encounter (HOSPITAL_COMMUNITY): Payer: Self-pay

## 2024-01-25 ENCOUNTER — Other Ambulatory Visit: Payer: Self-pay

## 2024-01-25 ENCOUNTER — Emergency Department (HOSPITAL_COMMUNITY)

## 2024-01-25 ENCOUNTER — Observation Stay (HOSPITAL_COMMUNITY)
Admission: EM | Admit: 2024-01-25 | Discharge: 2024-01-26 | Disposition: A | Discharge status: Home / Self Care | Attending: Emergency Medicine | Admitting: Emergency Medicine

## 2024-01-25 DIAGNOSIS — E1122 Type 2 diabetes mellitus with diabetic chronic kidney disease: Secondary | ICD-10-CM | POA: Insufficient documentation

## 2024-01-25 DIAGNOSIS — G8191 Hemiplegia, unspecified affecting right dominant side: Secondary | ICD-10-CM

## 2024-01-25 DIAGNOSIS — E1140 Type 2 diabetes mellitus with diabetic neuropathy, unspecified: Secondary | ICD-10-CM | POA: Diagnosis not present

## 2024-01-25 DIAGNOSIS — E785 Hyperlipidemia, unspecified: Secondary | ICD-10-CM | POA: Diagnosis present

## 2024-01-25 DIAGNOSIS — Z7982 Long term (current) use of aspirin: Secondary | ICD-10-CM | POA: Diagnosis not present

## 2024-01-25 DIAGNOSIS — I69351 Hemiplegia and hemiparesis following cerebral infarction affecting right dominant side: Secondary | ICD-10-CM | POA: Diagnosis not present

## 2024-01-25 DIAGNOSIS — G6289 Other specified polyneuropathies: Secondary | ICD-10-CM

## 2024-01-25 DIAGNOSIS — R112 Nausea with vomiting, unspecified: Secondary | ICD-10-CM | POA: Diagnosis present

## 2024-01-25 DIAGNOSIS — I10 Essential (primary) hypertension: Secondary | ICD-10-CM | POA: Diagnosis present

## 2024-01-25 DIAGNOSIS — D649 Anemia, unspecified: Secondary | ICD-10-CM | POA: Diagnosis present

## 2024-01-25 DIAGNOSIS — K5641 Fecal impaction: Principal | ICD-10-CM | POA: Diagnosis present

## 2024-01-25 DIAGNOSIS — D631 Anemia in chronic kidney disease: Secondary | ICD-10-CM | POA: Insufficient documentation

## 2024-01-25 DIAGNOSIS — G629 Polyneuropathy, unspecified: Secondary | ICD-10-CM

## 2024-01-25 DIAGNOSIS — H548 Legal blindness, as defined in USA: Secondary | ICD-10-CM | POA: Diagnosis not present

## 2024-01-25 DIAGNOSIS — E876 Hypokalemia: Secondary | ICD-10-CM | POA: Diagnosis not present

## 2024-01-25 DIAGNOSIS — R109 Unspecified abdominal pain: Secondary | ICD-10-CM | POA: Diagnosis not present

## 2024-01-25 DIAGNOSIS — Z992 Dependence on renal dialysis: Secondary | ICD-10-CM | POA: Insufficient documentation

## 2024-01-25 DIAGNOSIS — K5900 Constipation, unspecified: Secondary | ICD-10-CM | POA: Diagnosis not present

## 2024-01-25 DIAGNOSIS — R1013 Epigastric pain: Secondary | ICD-10-CM | POA: Diagnosis not present

## 2024-01-25 DIAGNOSIS — Z79899 Other long term (current) drug therapy: Secondary | ICD-10-CM | POA: Insufficient documentation

## 2024-01-25 DIAGNOSIS — R188 Other ascites: Secondary | ICD-10-CM | POA: Diagnosis not present

## 2024-01-25 DIAGNOSIS — N186 End stage renal disease: Secondary | ICD-10-CM

## 2024-01-25 DIAGNOSIS — Z794 Long term (current) use of insulin: Secondary | ICD-10-CM | POA: Insufficient documentation

## 2024-01-25 DIAGNOSIS — I120 Hypertensive chronic kidney disease with stage 5 chronic kidney disease or end stage renal disease: Secondary | ICD-10-CM | POA: Diagnosis not present

## 2024-01-25 DIAGNOSIS — N185 Chronic kidney disease, stage 5: Secondary | ICD-10-CM | POA: Diagnosis present

## 2024-01-25 HISTORY — DX: Cerebral infarction due to thrombosis of basilar artery: I63.02

## 2024-01-25 LAB — COMPREHENSIVE METABOLIC PANEL WITH GFR
ALT: 22 U/L (ref 0–44)
AST: 26 U/L (ref 15–41)
Albumin: 2.5 g/dL — ABNORMAL LOW (ref 3.5–5.0)
Alkaline Phosphatase: 149 U/L — ABNORMAL HIGH (ref 38–126)
Anion gap: 15 (ref 5–15)
BUN: 19 mg/dL (ref 8–23)
CO2: 29 mmol/L (ref 22–32)
Calcium: 8.4 mg/dL — ABNORMAL LOW (ref 8.9–10.3)
Chloride: 95 mmol/L — ABNORMAL LOW (ref 98–111)
Creatinine, Ser: 2.7 mg/dL — ABNORMAL HIGH (ref 0.61–1.24)
GFR, Estimated: 24 mL/min — ABNORMAL LOW (ref >60)
Glucose, Bld: 387 mg/dL — ABNORMAL HIGH (ref 70–99)
Potassium: 3.2 mmol/L — ABNORMAL LOW (ref 3.5–5.1)
Sodium: 139 mmol/L (ref 135–145)
Total Bilirubin: 1.1 mg/dL (ref 0.0–1.2)
Total Protein: 7.8 g/dL (ref 6.5–8.1)

## 2024-01-25 LAB — URINALYSIS, W/ REFLEX TO CULTURE (INFECTION SUSPECTED)
Bilirubin Urine: NEGATIVE
Glucose, UA: >=500 mg/dL — AB
Ketones, ur: NEGATIVE mg/dL
Leukocytes,Ua: NEGATIVE
Nitrite: NEGATIVE
Protein, ur: >=300 mg/dL — AB
Specific Gravity, Urine: 1.009 (ref 1.005–1.030)
pH: 7 (ref 5.0–8.0)

## 2024-01-25 LAB — GLUCOSE, CAPILLARY
Glucose-Capillary: 288 mg/dL — ABNORMAL HIGH (ref 70–99)
Glucose-Capillary: 202 mg/dL — ABNORMAL HIGH (ref 70–99)

## 2024-01-25 LAB — CBC
HCT: 36.9 % — ABNORMAL LOW (ref 39.0–52.0)
Hemoglobin: 11.3 g/dL — ABNORMAL LOW (ref 13.0–17.0)
MCH: 26.4 pg (ref 26.0–34.0)
MCHC: 30.6 g/dL (ref 30.0–36.0)
MCV: 86.2 fL (ref 80.0–100.0)
Platelets: 335 10*3/uL (ref 150–400)
RBC: 4.28 MIL/uL (ref 4.22–5.81)
RDW: 19.7 % — ABNORMAL HIGH (ref 11.5–15.5)
WBC: 8 10*3/uL (ref 4.0–10.5)
nRBC: 0.8 % — ABNORMAL HIGH (ref 0.0–0.2)

## 2024-01-25 LAB — LIPASE, BLOOD: Lipase: 25 U/L (ref 11–51)

## 2024-01-25 MED ORDER — DIPHENHYDRAMINE HCL 50 MG/ML IJ SOLN
12.5000 mg | Freq: Once | INTRAMUSCULAR | Status: AC
  Administered 2024-01-25: 12.5 mg via INTRAVENOUS
  Filled 2024-01-25: qty 1

## 2024-01-25 MED ORDER — SODIUM CHLORIDE 0.9 % IV SOLN: INTRAVENOUS | Status: DC

## 2024-01-25 MED ORDER — CHLORPROMAZINE HCL 25 MG PO TABS
25.0000 mg | ORAL_TABLET | Freq: Every day | ORAL | Status: DC | PRN
Start: 2024-01-25 — End: 2024-01-25

## 2024-01-25 MED ORDER — FLEET ENEMA RE ENEM: 1.0000 | ENEMA | Freq: Once | RECTAL | Status: DC

## 2024-01-25 MED ORDER — FLEET ENEMA RE ENEM
1.0000 | ENEMA | Freq: Once | RECTAL | Status: AC
  Administered 2024-01-25: 1 via RECTAL
  Filled 2024-01-25: qty 1

## 2024-01-25 MED ORDER — SODIUM CHLORIDE 0.9% FLUSH
3.0000 mL | Freq: Two times a day (BID) | INTRAVENOUS | Status: DC
  Administered 2024-01-25: 3 mL via INTRAVENOUS

## 2024-01-25 MED ORDER — INSULIN GLARGINE-YFGN 100 UNIT/ML ~~LOC~~ SOLN
10.0000 [IU] | Freq: Every day | SUBCUTANEOUS | Status: DC
  Administered 2024-01-26: 10 [IU] via SUBCUTANEOUS
  Filled 2024-01-25 (×3): qty 0.1

## 2024-01-25 MED ORDER — SODIUM CHLORIDE 0.9 % IV BOLUS
250.0000 mL | Freq: Once | INTRAVENOUS | Status: AC
  Administered 2024-01-25: 250 mL via INTRAVENOUS

## 2024-01-25 MED ORDER — BUPROPION HCL ER (XL) 150 MG PO TB24
300.0000 mg | ORAL_TABLET | Freq: Every day | ORAL | Status: DC
  Administered 2024-01-26: 300 mg via ORAL
  Filled 2024-01-25: qty 2

## 2024-01-25 MED ORDER — ACETAMINOPHEN 325 MG PO TABS: 650.0000 mg | ORAL_TABLET | Freq: Four times a day (QID) | ORAL | Status: DC | PRN

## 2024-01-25 MED ORDER — ASPIRIN 81 MG PO TBEC
81.0000 mg | DELAYED_RELEASE_TABLET | Freq: Every day | ORAL | Status: DC
  Administered 2024-01-26: 81 mg via ORAL
  Filled 2024-01-25: qty 1

## 2024-01-25 MED ORDER — METOPROLOL TARTRATE 25 MG PO TABS
50.0000 mg | ORAL_TABLET | Freq: Two times a day (BID) | ORAL | Status: DC
  Administered 2024-01-25 – 2024-01-26 (×2): 50 mg via ORAL
  Filled 2024-01-25 (×2): qty 2

## 2024-01-25 MED ORDER — HEPARIN SODIUM (PORCINE) 5000 UNIT/ML IJ SOLN
5000.0000 [IU] | Freq: Three times a day (TID) | INTRAMUSCULAR | Status: DC
  Administered 2024-01-25 – 2024-01-26 (×4): 5000 [IU] via SUBCUTANEOUS
  Filled 2024-01-25 (×4): qty 1

## 2024-01-25 MED ORDER — METOPROLOL TARTRATE 5 MG/5ML IV SOLN
5.0000 mg | Freq: Once | INTRAVENOUS | Status: AC
  Administered 2024-01-25: 5 mg via INTRAVENOUS
  Filled 2024-01-25: qty 5

## 2024-01-25 MED ORDER — AMLODIPINE BESYLATE 5 MG PO TABS
5.0000 mg | ORAL_TABLET | Freq: Every day | ORAL | Status: DC
  Administered 2024-01-26: 5 mg via ORAL
  Filled 2024-01-25 (×2): qty 1

## 2024-01-25 MED ORDER — INSULIN ASPART 100 UNIT/ML IJ SOLN
0.0000 [IU] | Freq: Three times a day (TID) | INTRAMUSCULAR | Status: DC
  Administered 2024-01-25: 3 [IU] via SUBCUTANEOUS

## 2024-01-25 MED ORDER — METOCLOPRAMIDE HCL 5 MG/ML IJ SOLN
5.0000 mg | Freq: Once | INTRAMUSCULAR | Status: AC
  Administered 2024-01-25: 5 mg via INTRAVENOUS
  Filled 2024-01-25: qty 2

## 2024-01-25 MED ORDER — ACETAMINOPHEN 650 MG RE SUPP: 650.0000 mg | Freq: Four times a day (QID) | RECTAL | Status: DC | PRN

## 2024-01-25 MED ORDER — PANTOPRAZOLE SODIUM 40 MG PO TBEC
40.0000 mg | DELAYED_RELEASE_TABLET | Freq: Every day | ORAL | Status: DC
  Administered 2024-01-26: 40 mg via ORAL
  Filled 2024-01-25: qty 1

## 2024-01-25 MED ORDER — POLYETHYLENE GLYCOL 3350 17 G PO PACK
17.0000 g | PACK | Freq: Two times a day (BID) | ORAL | Status: DC
  Administered 2024-01-25 – 2024-01-26 (×3): 17 g via ORAL
  Filled 2024-01-25 (×3): qty 1

## 2024-01-25 NOTE — ED Notes (Signed)
 Called 6 N aware patient is coming

## 2024-01-25 NOTE — ED Provider Notes (Signed)
 [place] EMERGENCY DEPARTMENT AT [facility] Provider Note   CSN: [id number] Arrival date & time: 01/25/24  4098     History  Chief Complaint  Patient presents with   Abdominal Pain   Emesis    Russell Austin is a 73 y.o. male.  73 year old male who presents with abdominal discomfort with emesis which is not bilious that began yesterday.  History of multiple episodes of this in the past.  Unclear etiology.  States that it occurs right before he eats.  Denies any black or bloody stools.  Patient has history of ESRD and was scheduled for dialysis today.  He does make urine but denies any urinary symptoms.  States that his initial abdominal discomfort was midepigastric but that has since resolved.  Has not had a bowel movement in over a week       Home Medications Prior to Admission medications   Medication Sig Start Date End Date Taking? Authorizing Provider  acetaminophen  (TYLENOL ) 325 MG tablet Take 2 tablets (650 mg total) by mouth every 6 (six) hours as needed for mild pain (pain score 1-3) (or Fever >/= 101). 01/04/24   Aura Leeds Latif, DO  albuterol  (PROVENTIL ) (2.5 MG/3ML) 0.083% nebulizer solution Take 3 mLs (2.5 mg total) by nebulization every 6 (six) hours as needed for wheezing or shortness of breath. 01/04/24   Aura Leeds Latif, DO  amLODipine  (NORVASC ) 5 MG tablet Take 1 tablet (5 mg total) by mouth daily. 06/11/23   Nafziger, Randel Buss, NP  aspirin  EC 81 MG tablet Take 81 mg by mouth daily.    [provider]  atorvastatin  (LIPITOR) 10 MG tablet Take 1 tablet (10 mg total) by mouth every morning. 05/30/23   Nafziger, Randel Buss, NP  buPROPion  (WELLBUTRIN  XL) 300 MG 24 hr tablet TAKE 1 TABLET (300 MG TOTAL) BY MOUTH EVERY MORNING. 11/26/23   Nafziger, Randel Buss, NP  calcitRIOL  (ROCALTROL ) 0.5 MCG capsule Take 1 capsule (0.5 mcg total) by mouth daily. 01/04/24   Sheikh, Omair Latif, DO  chlorproMAZINE  (THORAZINE ) 25 MG tablet TAKE 1 TABLET BY MOUTH THREE TIMES A  DAY Patient taking differently: Take 25 mg by mouth daily as needed for hiccoughs. 11/21/23   Nafziger, Randel Buss, NP  Continuous Glucose Sensor (FREESTYLE LIBRE 3 PLUS SENSOR) MISC Change sensor every 15 days. 07/09/23   Nafziger, Randel Buss, NP  famotidine  (PEPCID ) 20 MG tablet Take 1 tablet (20 mg total) by mouth 2 (two) times daily for 7 days. 01/16/24 01/23/24  Mesner, Reymundo Caulk, MD  gabapentin  (NEURONTIN ) 300 MG capsule TAKE ONE CAPSULE BY MOUTH EVERYDAY AT BEDTIME 05/30/23   Nafziger, Randel Buss, NP  gentamicin  cream (GARAMYCIN ) 0.1 % Apply 1 Application topically daily. 04/23/23   [provider]  insulin  aspart (NOVOLOG  FLEXPEN) 100 UNIT/ML FlexPen Inject 5 Units into the skin 3 (three) times daily with meals. Patient taking differently: Inject 8 Units into the skin daily. 06/26/23   Nafziger, Randel Buss, NP  insulin  glargine (LANTUS  SOLOSTAR) 100 UNIT/ML Solostar Pen INJECT 20 UNITS INTO THE SKIN DAILY Patient taking differently: Inject 18 Units into the skin every evening. 12/05/23   Nafziger, Randel Buss, NP  Insulin  Pen Needle (B-D ULTRAFINE III SHORT PEN) 31G X 8 MM MISC Use to inject insulin  4 times per day as instructed 10/02/23   Nafziger, Randel Buss, NP  lactulose (CHRONULAC) 10 GM/15ML solution Take by mouth daily as needed for mild constipation or moderate constipation. 05/15/23   [provider]  losartan  (COZAAR ) 100 MG tablet TAKE 1 TABLET BY  MOUTH EVERY DAY IN THE MORNING 11/26/23   Nafziger, Randel Buss, NP  Methoxy PEG-Epoetin Beta (MIRCERA IJ) Inject into the skin. 08/22/23   [provider]  metoCLOPramide  (REGLAN ) 10 MG tablet TAKE 1 TABLET BY MOUTH 4 TIMES DAILY. Patient taking differently: Take 10 mg by mouth daily as needed for nausea or vomiting. 12/05/23   Nafziger, Randel Buss, NP  metoprolol  tartrate (LOPRESSOR ) 50 MG tablet Take 50 mg by mouth 2 (two) times daily. 09/07/23   [provider]  pantoprazole  (PROTONIX ) 40 MG tablet Take 1 tablet (40 mg total) by mouth daily. 01/16/24   Mesner, Reymundo Caulk,  MD  potassium chloride  SA (KLOR-CON  M) 20 MEQ tablet Take 1 tablet (20 mEq total) by mouth daily. 01/22/24   Nafziger, Randel Buss, NP  promethazine  (PHENERGAN ) 12.5 MG tablet Take 1 tablet (12.5 mg total) by mouth every 8 (eight) hours as needed for nausea or vomiting. 01/17/24   Nafziger, Randel Buss, NP  sucralfate  (CARAFATE ) 1 GM/10ML suspension Take 10 mLs (1 g total) by mouth 4 (four) times daily -  with meals and at bedtime. 01/16/24   Mesner, Reymundo Caulk, MD      Allergies    Lactose intolerance (gi), Apple pectin [pectin], Metformin  and related, Peach [prunus persica], and Morphine  and codeine    Review of Systems   Review of Systems  All other systems reviewed and are negative.   Physical Exam Updated Vital Signs BP (!) 143/83 (BP Location: Right Arm)   Pulse (!) 107   Temp 97.8 F (36.6 C)   Resp 16   Ht 1.753 m (5\' 9" )   Wt 74.4 kg   SpO2 100%   BMI 24.22 kg/m  Physical Exam Vitals and nursing note reviewed. Exam conducted with a chaperone present.  Constitutional:      General: He is not in acute distress.    Appearance: Normal appearance. He is well-developed. He is not toxic-appearing.  HENT:     Head: Normocephalic and atraumatic.  Eyes:     General: Lids are normal.     Conjunctiva/sclera: Conjunctivae normal.     Pupils: Pupils are equal, round, and reactive to light.  Neck:     Thyroid : No thyroid  mass.     Trachea: No tracheal deviation.  Cardiovascular:     Rate and Rhythm: Normal rate and regular rhythm.     Heart sounds: Normal heart sounds. No murmur heard.    No gallop.  Pulmonary:     Effort: Pulmonary effort is normal. No respiratory distress.     Breath sounds: Normal breath sounds. No stridor. No decreased breath sounds, wheezing, rhonchi or rales.  Abdominal:     General: There is no distension.     Palpations: Abdomen is soft.     Tenderness: There is abdominal tenderness in the epigastric area. There is no rebound.    Genitourinary:    Comments:  Stool-filled rectal vault noted Musculoskeletal:        General: No tenderness. Normal range of motion.     Cervical back: Normal range of motion and neck supple.  Skin:    General: Skin is warm and dry.     Findings: No abrasion or rash.  Neurological:     Mental Status: He is alert and oriented to person, place, and time. Mental status is at baseline.     GCS: GCS eye subscore is 4. GCS verbal subscore is 5. GCS motor subscore is 6.     Cranial Nerves: No cranial nerve deficit.  Sensory: No sensory deficit.     Motor: Motor function is intact.  Psychiatric:        Attention and Perception: Attention normal.        Speech: Speech normal.        Behavior: Behavior normal.     ED Results / Procedures / Treatments   Labs (all labs ordered are listed, but only abnormal results are displayed) Labs Reviewed  COMPREHENSIVE METABOLIC PANEL WITH GFR - Abnormal; Notable for the following components:      Result Value   Potassium 3.2 (*)    Chloride 95 (*)    Glucose, Bld 387 (*)    Creatinine, Ser 2.70 (*)    Calcium  8.4 (*)    Albumin  2.5 (*)    Alkaline Phosphatase 149 (*)    GFR, Estimated 24 (*)    All other components within normal limits  CBC - Abnormal; Notable for the following components:   Hemoglobin 11.3 (*)    HCT 36.9 (*)    RDW 19.7 (*)    nRBC 0.8 (*)    All other components within normal limits  LIPASE, BLOOD  URINALYSIS, ROUTINE W REFLEX MICROSCOPIC    EKG None  Radiology No results found.  Procedures Procedures    Medications Ordered in ED Medications  0.9 %  sodium chloride  infusion (has no administration in time range)  sodium chloride  0.9 % bolus 250 mL (has no administration in time range)  metoCLOPramide  (REGLAN ) injection 5 mg (has no administration in time range)  diphenhydrAMINE  (BENADRYL ) injection 12.5 mg (has no administration in time range)    ED Course/ Medical Decision Making/ A&P                                 Medical  Decision Making Amount and/or Complexity of Data Reviewed Labs: ordered. Radiology: ordered. ECG/medicine tests: ordered.  Risk Prescription drug management.   Patient presented with abdominal pain and vomiting.  Concern for possible bowel obstruction.  Abdominal CT per my review interpretation shows severe constipation.  Patient treated with gentle IV hydration as well as antiemetics but continues to note nausea.  Hypertensive here and given Lopressor  as he did not take his normal oral medications.  Does not appear to be fluid overload at this time.  Will need to be admitted for bowel care        Final Clinical Impression(s) / ED Diagnoses Final diagnoses:  None    Rx / DC Orders ED Discharge Orders     None         Lind Repine, MD 01/25/24 1245

## 2024-01-25 NOTE — H&P (Signed)
 History and Physical   Russell Austin YQM:[id number] DOB: 1951-06-19 DOA: 01/25/2024  PCP: Alto Atta, NP   Patient coming from: Home  Chief Complaint: Nausea vomiting and abdominal pain.  Constipation  HPI: Russell Austin is a 73 y.o. male with medical history significant of hypertension, hyperlipidemia, diabetes, ESRD, history of CVA with dysphagia, hemiparesis, anemia, legally blind, neuropathy presenting with 1 day of nausea vomiting abdominal pain.   Presenting after 1 day of nausea vomiting and abdominal pain as above.  States he has a history of prior similar episodes that typically occur right before he eats.  Reports he has not had a bowel movement for 1 week.  Preceding this no dark or bloody bowel movements.  Was due for dialysis today but has not gone due to this issue.  Denies fevers, chills, chest pain, shortness of breath.  ED Course: Vital signs in the ED notable for heart rate in the 100s, blood pressure in the 140 systolic.  Lab workup included CMP with potassium 3.2, chloride 95, creatinine stable at 2.7, glucose 387, calcium  8.4, albumin  2.5, alk phos 149.  CBC with hemoglobin stable to mildly elevated from baseline at 11.3.  Lipase normal.  CT ab pelvis showed generalized stool with stool distended rectum with fat stranding, prostatitis improved from previous, chronic peritoneal collections improved from previous.  Patient received 5 mg IV metoprolol , Reglan , Benadryl , 250 cc IV fluids in the ED. ED provider concern for developing impaction.  Review of Systems: As per HPI otherwise all other systems reviewed and are negative.  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)    Chronic kidney disease    Diabetes mellitus type II    Diabetic retinopathy FOLLOWED BY DR Seward Dao   ED (erectile dysfunction)    Glaucoma of both eyes     Hypertension    Hypertensive urgency 12/27/2023   Left hydrocele    Stroke [facility])     Past Surgical History:  Procedure Laterality Date   APPENDECTOMY  AGE EARLY 20'S   CAPD INSERTION N/A 04/11/2023   Procedure: LAPAROSCOPIC INSERTION CONTINUOUS AMBULATORY PERITONEAL DIALYSIS  (CAPD) CATHETER WITH  OMENTOPEXY;  Surgeon: Carlene Che, MD;  Location: [facility] OR;  Service: Vascular;  Laterality: N/A;   CAPD REMOVAL N/A 12/30/2023   Procedure: CONTINUOUS AMBULATORY PERITONEAL DIALYSIS  (CAPD) CATHETER REMOVAL;  Surgeon: Kayla Part, MD;  Location: [facility] OR;  Service: Vascular;  Laterality: N/A;   COLONOSCOPY  12/30/2020   every 5 years   HYDROCELE EXCISION  03/31/2012   Procedure: HYDROCELECTOMY ADULT;  Surgeon: Trent Frizzle, MD;  Location: [facility];  Service: Urology;  Laterality: Left;  45 MINS     INSERTION OF DIALYSIS CATHETER N/A 12/30/2023   Procedure: EXCHANGE OF DIALYSIS CATHETER;  Surgeon: Kayla Part, MD;  Location: [facility] OR;  Service: Vascular;  Laterality: N/A;   IR FLUORO GUIDE CV LINE LEFT  12/27/2023   IR US  GUIDE VASC ACCESS LEFT  12/27/2023   LEFT EYE LASER RETINA REPAIR  SEPT 2012   RIGHT EYE VITRECTOMY/ INSERTION GLAUCOMA SETON/ LASER REPAIR  12-13-2008   RETINAL ARTERY OCCLUSION /NEOVASCULAR GLAUCOMA/ HEMORRHAGE   RIGHT EYE VITRETOMY/ INSERTION GLAUCOMA SETON X2/ LASER  03-24-2009   RECURRENT HEMORRHAGE/ OCCLUSION INTERNAL SETON   SHOULDER ARTHROSCOPY Right 2005   undescended right testicle removed  1994  Social History  reports that he has never smoked. He has never used smokeless tobacco. He reports that he does not drink alcohol and does not use drugs.  Allergies  Allergen Reactions   Lactose Intolerance (Gi) Diarrhea   Apple Pectin [Pectin] Itching    ITCHY THROAT   Metformin  And Related Other (See Comments)    D/t decreased kidney function    Peach [Prunus Persica] Itching    ITCHY THROAT   Morphine  And Codeine Anxiety    Jittery     Family History  Problem Relation Age of Onset   Glaucoma Mother    Diabetes Mother    Stomach cancer Maternal Grandfather    Stroke Maternal Grandmother   Reviewed on admission  Prior to Admission medications   Medication Sig Start Date End Date Taking? Authorizing Provider  acetaminophen  (TYLENOL ) 325 MG tablet Take 2 tablets (650 mg total) by mouth every 6 (six) hours as needed for mild pain (pain score 1-3) (or Fever >/= 101). 01/04/24   Aura Leeds Latif, DO  albuterol  (PROVENTIL ) (2.5 MG/3ML) 0.083% nebulizer solution Take 3 mLs (2.5 mg total) by nebulization every 6 (six) hours as needed for wheezing or shortness of breath. 01/04/24   Aura Leeds Latif, DO  amLODipine  (NORVASC ) 5 MG tablet Take 1 tablet (5 mg total) by mouth daily. 06/11/23   Nafziger, Randel Buss, NP  aspirin  EC 81 MG tablet Take 81 mg by mouth daily.    [provider]  atorvastatin  (LIPITOR) 10 MG tablet Take 1 tablet (10 mg total) by mouth every morning. 05/30/23   Nafziger, Randel Buss, NP  buPROPion  (WELLBUTRIN  XL) 300 MG 24 hr tablet TAKE 1 TABLET (300 MG TOTAL) BY MOUTH EVERY MORNING. 11/26/23   Nafziger, Randel Buss, NP  calcitRIOL  (ROCALTROL ) 0.5 MCG capsule Take 1 capsule (0.5 mcg total) by mouth daily. 01/04/24   Sheikh, Omair Latif, DO  chlorproMAZINE  (THORAZINE ) 25 MG tablet TAKE 1 TABLET BY MOUTH THREE TIMES A DAY Patient taking differently: Take 25 mg by mouth daily as needed for hiccoughs. 11/21/23   Nafziger, Randel Buss, NP  Continuous Glucose Sensor (FREESTYLE LIBRE 3 PLUS SENSOR) MISC Change sensor every 15 days. 07/09/23   Nafziger, Randel Buss, NP  famotidine  (PEPCID ) 20 MG tablet Take 1 tablet (20 mg total) by mouth 2 (two) times daily for 7 days. 01/16/24 01/23/24  Mesner, Reymundo Caulk, MD  gabapentin  (NEURONTIN ) 300 MG capsule TAKE ONE CAPSULE BY MOUTH EVERYDAY AT BEDTIME 05/30/23   Nafziger, Randel Buss, NP  gentamicin  cream (GARAMYCIN ) 0.1 % Apply 1 Application topically daily. 04/23/23   [provider]  insulin  aspart (NOVOLOG   FLEXPEN) 100 UNIT/ML FlexPen Inject 5 Units into the skin 3 (three) times daily with meals. Patient taking differently: Inject 8 Units into the skin daily. 06/26/23   Nafziger, Randel Buss, NP  insulin  glargine (LANTUS  SOLOSTAR) 100 UNIT/ML Solostar Pen INJECT 20 UNITS INTO THE SKIN DAILY Patient taking differently: Inject 18 Units into the skin every evening. 12/05/23   Nafziger, Randel Buss, NP  Insulin  Pen Needle (B-D ULTRAFINE III SHORT PEN) 31G X 8 MM MISC Use to inject insulin  4 times per day as instructed 10/02/23   Nafziger, Randel Buss, NP  lactulose (CHRONULAC) 10 GM/15ML solution Take by mouth daily as needed for mild constipation or moderate constipation. 05/15/23   [provider]  losartan  (COZAAR ) 100 MG tablet TAKE 1 TABLET BY MOUTH EVERY DAY IN THE MORNING 11/26/23   Nafziger, Randel Buss, NP  Methoxy PEG-Epoetin Beta (MIRCERA IJ) Inject into the skin. 08/22/23  [provider]  metoCLOPramide  (REGLAN ) 10 MG tablet TAKE 1 TABLET BY MOUTH 4 TIMES DAILY. Patient taking differently: Take 10 mg by mouth daily as needed for nausea or vomiting. 12/05/23   Nafziger, Randel Buss, NP  metoprolol  tartrate (LOPRESSOR ) 50 MG tablet Take 50 mg by mouth 2 (two) times daily. 09/07/23   [provider]  pantoprazole  (PROTONIX ) 40 MG tablet Take 1 tablet (40 mg total) by mouth daily. 01/16/24   Mesner, Reymundo Caulk, MD  potassium chloride  SA (KLOR-CON  M) 20 MEQ tablet Take 1 tablet (20 mEq total) by mouth daily. 01/22/24   Nafziger, Randel Buss, NP  promethazine  (PHENERGAN ) 12.5 MG tablet Take 1 tablet (12.5 mg total) by mouth every 8 (eight) hours as needed for nausea or vomiting. 01/17/24   Nafziger, Randel Buss, NP  sucralfate  (CARAFATE ) 1 GM/10ML suspension Take 10 mLs (1 g total) by mouth 4 (four) times daily -  with meals and at bedtime. 01/16/24   Mesner, Reymundo Caulk, MD    Physical Exam: Vitals:   01/25/24 1001 01/25/24 1004 01/25/24 1245 01/25/24 1300  BP: (!) 143/83  (!) 183/102 (!) 167/100  Pulse: (!) 107  (!) 108 98  Resp: 16   15 18   Temp: 97.8 F (36.6 C)     SpO2: 100%  100% 100%  Weight:  74.4 kg    Height:  5\' 9"  (1.753 m)      Physical Exam Constitutional:      General: He is not in acute distress.    Appearance: Normal appearance.  HENT:     Head: Normocephalic and atraumatic.     Mouth/Throat:     Mouth: Mucous membranes are moist.     Pharynx: Oropharynx is clear.  Eyes:     Extraocular Movements: Extraocular movements intact.     Pupils: Pupils are equal, round, and reactive to light.  Cardiovascular:     Rate and Rhythm: Normal rate and regular rhythm.     Pulses: Normal pulses.     Heart sounds: Normal heart sounds.  Pulmonary:     Effort: Pulmonary effort is normal. No respiratory distress.     Breath sounds: Normal breath sounds.  Abdominal:     General: Bowel sounds are normal. There is no distension.     Palpations: Abdomen is soft.     Tenderness: There is no abdominal tenderness.  Musculoskeletal:        General: No swelling or deformity.  Skin:    General: Skin is warm and dry.  Neurological:     General: No focal deficit present.     Mental Status: Mental status is at baseline.     Labs on Admission: I have personally reviewed following labs and imaging studies  CBC: Recent Labs  Lab 01/25/24 1014  WBC 8.0  HGB 11.3*  HCT 36.9*  MCV 86.2  PLT 335    Basic Metabolic Panel: Recent Labs  Lab 01/25/24 1014  NA 139  K 3.2*  CL 95*  CO2 29  GLUCOSE 387*  BUN 19  CREATININE 2.70*  CALCIUM  8.4*    GFR: Estimated Creatinine Clearance: 24.7 mL/min (A) (by C-G formula based on SCr of 2.7 mg/dL (H)).  Liver Function Tests: Recent Labs  Lab 01/25/24 1014  AST 26  ALT 22  ALKPHOS 149*  BILITOT 1.1  PROT 7.8  ALBUMIN  2.5*    Urine analysis:    Component Value Date/Time   COLORURINE YELLOW 01/25/2024 1256   APPEARANCEUR CLEAR 01/25/2024 1256   LABSPEC 1.009  01/25/2024 1256   PHURINE 7.0 01/25/2024 1256   GLUCOSEU >=500 (A) 01/25/2024 1256   HGBUR  SMALL (A) 01/25/2024 1256   HGBUR trace-lysed 01/26/2010 0900   BILIRUBINUR NEGATIVE 01/25/2024 1256   BILIRUBINUR neg 10/30/2018 0949   KETONESUR NEGATIVE 01/25/2024 1256   PROTEINUR >=300 (A) 01/25/2024 1256   UROBILINOGEN 0.2 10/30/2018 0949   UROBILINOGEN 0.2 01/26/2010 0900   NITRITE NEGATIVE 01/25/2024 1256   LEUKOCYTESUR NEGATIVE 01/25/2024 1256    Radiological Exams on Admission: CT ABDOMEN PELVIS WO CONTRAST Result Date: 01/25/2024 CLINICAL DATA:  Abdominal pain EXAM: CT ABDOMEN AND PELVIS WITHOUT CONTRAST TECHNIQUE: Multidetector CT imaging of the abdomen and pelvis was performed following the standard protocol without IV contrast. RADIATION DOSE REDUCTION: This exam was performed according to the departmental dose-optimization program which includes automated exposure control, adjustment of the mA and/or kV according to patient size and/or use of iterative reconstruction technique. COMPARISON:  01/16/2024 FINDINGS: Lower chest: Gynecomastia. Central line at the right atrium reaching the lower cavoatrial junction. Hepatobiliary: Loculated gas and fluid in the high right peritoneal cavity adjacent to the liver. Loculated peritoneal fluid in the low ventral abdomen measuring 17 cm in length by 4.1 cm in thickness.No evidence of biliary obstruction or stone. Pancreas: Unremarkable. Spleen: Unremarkable. Adrenals/Urinary Tract: Negative adrenals. No hydronephrosis or stone. Unremarkable bladder. Stomach/Bowel: Stool distended rectum with mild adjacent fat stranding, up to 7 cm in diameter. Stool retention is generalized without small bowel obstructive changes. Vascular/Lymphatic: No acute vascular abnormality. No mass or adenopathy. Reproductive:No pathologic findings. Other: No ascites or pneumoperitoneum. Musculoskeletal: No acute abnormalities. Generalized degeneration of the spine with bulky endplate spurring multilevel ankylosis. IMPRESSION: Generalized stool with stool distended rectum.  Improvement in proctitis seen on prior. Chronic peritoneal collections without interval improvement but still smaller than at CT in April. Electronically Signed   By: Ronnette Coke M.D.   On: 01/25/2024 12:15   EKG: Independently reviewed.  Sinus rhythm at 99 bpm.  Nonspecific T wave changes.  Some baseline artifact noted.  Assessment/Plan Principal Problem:   Fecal impaction in rectum [facility]) Active Problems:   ESRD on dialysis (HCC)   Normocytic anemia   Hyperlipidemia   Legally blind   Peripheral neuropathy   Essential hypertension   CKD (chronic kidney disease) stage 5, GFR less than 15 ml/min (HCC)   Type 2 diabetes mellitus with diabetic neuropathy (HCC)   Right hemiparesis (HCC)   Fecal impaction > Patient with constipation for a week and now with nausea vomiting abdominal pain for 1 day. > CT abdomen pelvis did not show evidence of obstruction but did show generalized stool and a stool distended rectum with surrounding fat stranding. > Concern for developing impaction and possible developing stercoral colitis.  Does still appear to be air within the stool collection in the rectum on CT. - Monitor on telemetry overnight - Scheduled MiraLAX - Enema - IV fluids  Hypokalemia ESRD > No supplemental potassium given patient is ESRD.  Missed dialysis today due to coming to the hospital. - Trend renal function and electrolytes - Nephrology consult for dialysis while here  Hypertension - Continue amlodipine  and metoprolol  - Holding losartan   Hyperlipidemia - Not on medication for this  Diabetes > 18 units at bedtime at home - 10 units nightly, SSI  History of CVA - Continue home ASA  Anemia > Hemoglobin stable at 11.3 - Trend CBC  Legally blind History of neuropathy - Noted   DVT prophylaxis: Heparin  Code Status:   Full  Family Communication:  None on admission  Disposition Plan:   Patient is from:  Home  Anticipated DC to:  Home  Anticipated DC date:  1 to 2  days  Anticipated DC barriers: None  Consults called:  None Admission status:  Observation, telemetry  Severity of Illness: The appropriate patient status for this patient is OBSERVATION. Observation status is judged to be reasonable and necessary in order to provide the required intensity of service to ensure the patient's safety. The patient's presenting symptoms, physical exam findings, and initial radiographic and laboratory data in the context of their medical condition is felt to place them at decreased risk for further clinical deterioration. Furthermore, it is anticipated that the patient will be medically stable for discharge from the hospital within 2 midnights of admission.    Johnetta Nab MD [facility]  How to contact the [facility] Attending or Consulting provider 7A - 7P or covering provider during after hours 7P -7A, for this patient?   Check the care team in [facility] and look for a) attending/consulting [facility] provider listed and b) the [facility] team listed Log into [email or web address] and use [place]'s universal password to access. If you do not have the password, please contact the hospital operator. Locate the [facility] provider you are looking for under [facility] and page to a number that you can be directly reached. If you still have difficulty reaching the provider, please page the [facility] (Director on Call) for the Hospitalists listed on amion for assistance.  01/25/2024, 1:30 PM

## 2024-01-25 NOTE — Plan of Care (Signed)

## 2024-01-25 NOTE — ED Triage Notes (Signed)
 Pt came in via POV d/t n/v since last night. States he has been having lower abd pain, denies diarrhea. A/Ox4, rates his pain 6/10 during triage. Pt unaware all the changed but does states he has had some changes to his medications recently.

## 2024-01-26 DIAGNOSIS — K5641 Fecal impaction: Secondary | ICD-10-CM | POA: Diagnosis not present

## 2024-01-26 LAB — CBC
HCT: 34.5 % — ABNORMAL LOW (ref 39.0–52.0)
Hemoglobin: 10.7 g/dL — ABNORMAL LOW (ref 13.0–17.0)
MCH: 26.4 pg (ref 26.0–34.0)
MCHC: 31 g/dL (ref 30.0–36.0)
MCV: 85.2 fL (ref 80.0–100.0)
Platelets: 330 10*3/uL (ref 150–400)
RBC: 4.05 MIL/uL — ABNORMAL LOW (ref 4.22–5.81)
RDW: 19.8 % — ABNORMAL HIGH (ref 11.5–15.5)
WBC: 7.5 10*3/uL (ref 4.0–10.5)
nRBC: 0.3 % — ABNORMAL HIGH (ref 0.0–0.2)

## 2024-01-26 LAB — COMPREHENSIVE METABOLIC PANEL WITH GFR
ALT: 19 U/L (ref 0–44)
AST: 19 U/L (ref 15–41)
Albumin: 2.2 g/dL — ABNORMAL LOW (ref 3.5–5.0)
Alkaline Phosphatase: 117 U/L (ref 38–126)
Anion gap: 10 (ref 5–15)
BUN: 20 mg/dL (ref 8–23)
CO2: 29 mmol/L (ref 22–32)
Calcium: 7.6 mg/dL — ABNORMAL LOW (ref 8.9–10.3)
Chloride: 102 mmol/L (ref 98–111)
Creatinine, Ser: 2.46 mg/dL — ABNORMAL HIGH (ref 0.61–1.24)
GFR, Estimated: 27 mL/min — ABNORMAL LOW (ref >60)
Glucose, Bld: 148 mg/dL — ABNORMAL HIGH (ref 70–99)
Potassium: 2.7 mmol/L — CL (ref 3.5–5.1)
Sodium: 141 mmol/L (ref 135–145)
Total Bilirubin: 1.1 mg/dL (ref 0.0–1.2)
Total Protein: 7 g/dL (ref 6.5–8.1)

## 2024-01-26 LAB — GLUCOSE, CAPILLARY
Glucose-Capillary: 143 mg/dL — ABNORMAL HIGH (ref 70–99)
Glucose-Capillary: 135 mg/dL — ABNORMAL HIGH (ref 70–99)

## 2024-01-26 MED ORDER — LOSARTAN POTASSIUM 50 MG PO TABS
100.0000 mg | ORAL_TABLET | Freq: Every day | ORAL | Status: DC
  Administered 2024-01-26: 100 mg via ORAL
  Filled 2024-01-26: qty 2

## 2024-01-26 MED ORDER — HYDRALAZINE HCL 20 MG/ML IJ SOLN
10.0000 mg | Freq: Four times a day (QID) | INTRAMUSCULAR | Status: DC | PRN
  Administered 2024-01-26: 10 mg via INTRAVENOUS
  Filled 2024-01-26: qty 1

## 2024-01-26 MED ORDER — CHLORHEXIDINE GLUCONATE CLOTH 2 % EX PADS
6.0000 | MEDICATED_PAD | Freq: Every day | CUTANEOUS | Status: DC
  Administered 2024-01-26: 6 via TOPICAL

## 2024-01-26 MED ORDER — POTASSIUM CHLORIDE CRYS ER 20 MEQ PO TBCR: 40.0000 meq | EXTENDED_RELEASE_TABLET | ORAL | Status: DC

## 2024-01-26 MED ORDER — METOPROLOL TARTRATE 100 MG PO TABS
100.0000 mg | ORAL_TABLET | Freq: Two times a day (BID) | ORAL | 0 refills | Status: DC
Start: 2024-01-26 — End: 2024-03-18

## 2024-01-26 MED ORDER — POTASSIUM CHLORIDE CRYS ER 20 MEQ PO TBCR
20.0000 meq | EXTENDED_RELEASE_TABLET | Freq: Once | ORAL | Status: AC
  Administered 2024-01-26: 20 meq via ORAL
  Filled 2024-01-26: qty 1

## 2024-01-26 MED ORDER — DOCUSATE SODIUM 100 MG PO CAPS
100.0000 mg | ORAL_CAPSULE | Freq: Two times a day (BID) | ORAL | Status: DC
  Administered 2024-01-26: 100 mg via ORAL
  Filled 2024-01-26: qty 1

## 2024-01-26 MED ORDER — SENNOSIDES-DOCUSATE SODIUM 8.6-50 MG PO TABS: 2.0000 | ORAL_TABLET | Freq: Every day | ORAL | Status: DC

## 2024-01-26 MED ORDER — POTASSIUM CHLORIDE 10 MEQ/100ML IV SOLN
10.0000 meq | INTRAVENOUS | Status: AC
  Administered 2024-01-26 (×6): 10 meq via INTRAVENOUS
  Filled 2024-01-26 (×7): qty 100

## 2024-01-26 MED ORDER — AMLODIPINE BESYLATE 10 MG PO TABS: 10.0000 mg | ORAL_TABLET | Freq: Every day | ORAL | 0 refills | Status: DC

## 2024-01-26 MED ORDER — POTASSIUM CHLORIDE CRYS ER 20 MEQ PO TBCR: 40.0000 meq | EXTENDED_RELEASE_TABLET | Freq: Once | ORAL | Status: DC

## 2024-01-26 MED ORDER — AMLODIPINE BESYLATE 5 MG PO TABS
5.0000 mg | ORAL_TABLET | Freq: Once | ORAL | Status: AC
  Administered 2024-01-26: 5 mg via ORAL
  Filled 2024-01-26: qty 1

## 2024-01-26 NOTE — Care Management Obs Status (Signed)
 MEDICARE OBSERVATION STATUS NOTIFICATION   Patient Details  Name: Russell Austin MRN: [id number] Date of Birth: May 07, 1951   Medicare Observation Status Notification Given:  Yes    Jannine Meo, RN 01/26/2024, 9:51 AM

## 2024-01-26 NOTE — Plan of Care (Signed)

## 2024-01-26 NOTE — Discharge Summary (Signed)
 Physician Discharge Summary  Russell Austin GNF:[id number] DOB: 1951-07-27 DOA: 01/25/2024  PCP: Alto Atta, NP  Admit date: 01/25/2024 Discharge date: 01/26/2024 30 Day Unplanned Readmission Risk Score    Flowsheet Row ED to Hosp-Admission (Discharged) from 12/27/2023 in [facility]  SURGICAL  30 Day Unplanned Readmission Risk Score (%) 31.43 Filed at 01/05/2024 1600       This score is the patient's risk of an unplanned readmission within 30 days of being discharged (0 -100%). The score is based on dignosis, age, lab data, medications, orders, and past utilization.   Low:  0-14.9   Medium: 15-21.9   High: 22-29.9   Extreme: 30 and above          Admitted From: Home Disposition: Home  Recommendations for Outpatient Follow-up:  Follow up with PCP in 1-2 weeks Please obtain BMP/CBC in one week Please follow up with your PCP on the following pending results: Unresulted Labs (From admission, onward)    None         Home Health: None Equipment/Devices: None  Discharge Condition: Stable CODE STATUS: Full code Diet recommendation: Renal  Following HPI and ED course is copied from admitting hospitalist H&P. HPI: Russell Austin is a 73 y.o. male with medical history significant of hypertension, hyperlipidemia, diabetes, ESRD, history of CVA with dysphagia, hemiparesis, anemia, legally blind, neuropathy presenting with 1 day of nausea vomiting abdominal pain.    Presenting after 1 day of nausea vomiting and abdominal pain as above.  States he has a history of prior similar episodes that typically occur right before he eats.  Reports he has not had a bowel movement for 1 week.  Preceding this no dark or bloody bowel movements.   Was due for dialysis today but has not gone due to this issue.  Denies fevers, chills, chest pain, shortness of breath.   ED Course: Vital signs in the ED notable for heart rate in the 100s, blood pressure in the 140  systolic.  Lab workup included CMP with potassium 3.2, chloride 95, creatinine stable at 2.7, glucose 387, calcium  8.4, albumin  2.5, alk phos 149.  CBC with hemoglobin stable to mildly elevated from baseline at 11.3.  Lipase normal.  CT ab pelvis showed generalized stool with stool distended rectum with fat stranding, prostatitis improved from previous, chronic peritoneal collections improved from previous.  Patient received 5 mg IV metoprolol , Reglan , Benadryl , 250 cc IV fluids in the ED. ED provider concern for developing impaction.  Subjective: Patient seen and examined, wife at the bedside.  Patient fully alert and oriented.  He has no complaints at all.  No nausea vomiting, abdominal pain.  Patient had 2 bowel movements.  Cussed discharge plan with the patient and the wife and they both are in agreement.  Brief/Interim Summary: Details of hospitalization as below.  Fecal impaction/constipation/nausea and vomiting > CT abdomen pelvis did not show evidence of obstruction but did show generalized stool and a stool distended rectum with surrounding fat stranding.  Started on MiraLAX, enema, patient had 2 bowel movements.  He does not have any nausea vomiting or abdominal pain anymore.  He is tolerating soft diet.  He is in agreement with going home.  Hypokalemia/ESRD On TTS schedule.  Potassium is still low at 2.7, patient has been ordered 80 mEq of potassium replacement.  Discussed in length with nephrology/Dr. Ansel Kingdom, patient missed his dialysis.  Per nephrology, there is no indication for urgent or emergent dialysis and they  feel comfortable with the patient going home and resuming dialysis on Tuesday.   Hypertension Blood pressure still elevated, despite of continuing his amlodipine  5 mg, metoprolol  50 twice daily and losartan  100 daily.  At discharge, I have increased his amlodipine  to 10 mg, metoprolol  to 100 mg twice daily and resuming PTA losartan  dose.   Hyperlipidemia - Not on  medication for this   Diabetes melitis type II: Resume home medications.   History of CVA - Continue home ASA   Anemia of chronic disease: Stable   Legally blind History of neuropathy - Noted     Discharge plan was discussed with patient and/or family member and they verbalized understanding and agreed with it.  Discharge Diagnoses:  Principal Problem:   Fecal impaction in rectum [facility]) Active Problems:   ESRD on dialysis (HCC)   Normocytic anemia   Hyperlipidemia   Legally blind   Peripheral neuropathy   Essential hypertension   CKD (chronic kidney disease) stage 5, GFR less than 15 ml/min (HCC)   Type 2 diabetes mellitus with diabetic neuropathy (HCC)   Right hemiparesis (HCC)    Discharge Instructions   Allergies as of 01/26/2024       Reactions   Lactose Intolerance (gi) Diarrhea   Peach [prunus Persica] Itching, Other (See Comments)   ITCHY THROAT   Apple Pectin [pectin] Itching, Other (See Comments)   ITCHY THROAT   Metformin  And Related Other (See Comments)   D/t decreased kidney function    Morphine  And Codeine Anxiety        Medication List     PAUSE taking these medications    atorvastatin  10 MG tablet Wait to take this until your doctor or other care provider tells you to start again. Commonly known as: LIPITOR Take 1 tablet (10 mg total) by mouth every morning.       TAKE these medications    acetaminophen  325 MG tablet Commonly known as: TYLENOL  Take 2 tablets (650 mg total) by mouth every 6 (six) hours as needed for mild pain (pain score 1-3) (or Fever >/= 101).   albuterol  (2.5 MG/3ML) 0.083% nebulizer solution Commonly known as: PROVENTIL  Take 3 mLs (2.5 mg total) by nebulization every 6 (six) hours as needed for wheezing or shortness of breath.   amLODipine  10 MG tablet Commonly known as: NORVASC  Take 1 tablet (10 mg total) by mouth daily. What changed:  medication strength how much to take   aspirin  EC 81 MG tablet Take  81 mg by mouth daily.   buPROPion  300 MG 24 hr tablet Commonly known as: WELLBUTRIN  XL TAKE 1 TABLET (300 MG TOTAL) BY MOUTH EVERY MORNING.   calcitRIOL  0.5 MCG capsule Commonly known as: ROCALTROL  Take 1 capsule (0.5 mcg total) by mouth daily.   chlorproMAZINE  25 MG tablet Commonly known as: THORAZINE  TAKE 1 TABLET BY MOUTH THREE TIMES A DAY What changed:  when to take this reasons to take this   FreeStyle Libre 3 Plus Sensor Misc Change sensor every 15 days. What changed:  how much to take how to take this when to take this additional instructions   gabapentin  300 MG capsule Commonly known as: NEURONTIN  TAKE ONE CAPSULE BY MOUTH EVERYDAY AT BEDTIME What changed:  how much to take how to take this when to take this additional instructions   gentamicin  cream 0.1 % Commonly known as: GARAMYCIN  Apply 1 Application topically daily.   lactulose 10 GM/15ML solution Commonly known as: CHRONULAC Take by mouth daily as  needed for mild constipation or moderate constipation.   Lantus  SoloStar 100 UNIT/ML Solostar Pen Generic drug: insulin  glargine INJECT 20 UNITS INTO THE SKIN DAILY What changed:  how much to take when to take this   losartan  100 MG tablet Commonly known as: COZAAR  TAKE 1 TABLET BY MOUTH EVERY DAY IN THE MORNING What changed: See the new instructions.   metoCLOPramide  10 MG tablet Commonly known as: REGLAN  TAKE 1 TABLET BY MOUTH 4 TIMES DAILY. What changed:  when to take this reasons to take this   metoprolol  tartrate 100 MG tablet Commonly known as: LOPRESSOR  Take 1 tablet (100 mg total) by mouth 2 (two) times daily. What changed:  medication strength how much to take   NovoLOG  FlexPen 100 UNIT/ML FlexPen Generic drug: insulin  aspart Inject 5 Units into the skin 3 (three) times daily with meals. What changed: when to take this   pantoprazole  40 MG tablet Commonly known as: PROTONIX  Take 1 tablet (40 mg total) by mouth daily.    potassium chloride  SA 20 MEQ tablet Commonly known as: KLOR-CON  M Take 1 tablet (20 mEq total) by mouth daily.   promethazine  12.5 MG tablet Commonly known as: PHENERGAN  Take 1 tablet (12.5 mg total) by mouth every 8 (eight) hours as needed for nausea or vomiting.   sucralfate  1 GM/10ML suspension Commonly known as: Carafate  Take 10 mLs (1 g total) by mouth 4 (four) times daily -  with meals and at bedtime.        Follow-up Information     Nafziger, Randel Buss, NP Follow up in 1 week(s).   Specialty: Family Medicine Contact information: [address] [place] [address] [phone]                Allergies  Allergen Reactions   Lactose Intolerance (Gi) Diarrhea   Peach [Prunus Persica] Itching and Other (See Comments)    ITCHY THROAT   Apple Pectin [Pectin] Itching and Other (See Comments)    ITCHY THROAT   Metformin  And Related Other (See Comments)    D/t decreased kidney function    Morphine  And Codeine Anxiety    Consultations: Nephrology   Procedures/Studies: CT ABDOMEN PELVIS WO CONTRAST Result Date: 01/25/2024 CLINICAL DATA:  Abdominal pain EXAM: CT ABDOMEN AND PELVIS WITHOUT CONTRAST TECHNIQUE: Multidetector CT imaging of the abdomen and pelvis was performed following the standard protocol without IV contrast. RADIATION DOSE REDUCTION: This exam was performed according to the departmental dose-optimization program which includes automated exposure control, adjustment of the mA and/or kV according to patient size and/or use of iterative reconstruction technique. COMPARISON:  01/16/2024 FINDINGS: Lower chest: Gynecomastia. Central line at the right atrium reaching the lower cavoatrial junction. Hepatobiliary: Loculated gas and fluid in the high right peritoneal cavity adjacent to the liver. Loculated peritoneal fluid in the low ventral abdomen measuring 17 cm in length by 4.1 cm in thickness.No evidence of biliary obstruction or stone. Pancreas:  Unremarkable. Spleen: Unremarkable. Adrenals/Urinary Tract: Negative adrenals. No hydronephrosis or stone. Unremarkable bladder. Stomach/Bowel: Stool distended rectum with mild adjacent fat stranding, up to 7 cm in diameter. Stool retention is generalized without small bowel obstructive changes. Vascular/Lymphatic: No acute vascular abnormality. No mass or adenopathy. Reproductive:No pathologic findings. Other: No ascites or pneumoperitoneum. Musculoskeletal: No acute abnormalities. Generalized degeneration of the spine with bulky endplate spurring multilevel ankylosis. IMPRESSION: Generalized stool with stool distended rectum. Improvement in proctitis seen on prior. Chronic peritoneal collections without interval improvement but still smaller than at CT in April.  Electronically Signed   By: Ronnette Coke M.D.   On: 01/25/2024 12:15   CT Renal Stone Study Result Date: 01/16/2024 CLINICAL DATA:  Abdominal/flank pain. Stone suspected. History of renal failure on peritoneal dialysis. Peritoneal dialysis catheter was removed 12/30/2023 and intravascular dialysis catheter inserted. EXAM: CT ABDOMEN AND PELVIS WITHOUT CONTRAST TECHNIQUE: Multidetector CT imaging of the abdomen and pelvis was performed following the standard protocol without IV contrast. RADIATION DOSE REDUCTION: This exam was performed according to the departmental dose-optimization program which includes automated exposure control, adjustment of the mA and/or kV according to patient size and/or use of iterative reconstruction technique. COMPARISON:  CTs without contrast 12/27/2023 and 09/16/2023. FINDINGS: Lower chest: Bilateral subareolar gynecomastia. There is new demonstration of a partially visualized double-lumen catheter presumably for dialysis, extending through the right atrium and terminating in the intrahepatic IVC. The cardiac size is normal. Small pericardial effusion again seen anteriorly. Small hiatal hernia with increased thickening  in the distal thoracic esophagus. Endoscopy is recommended. Lung bases are clear of infiltrates. There is mild posterior atelectasis. Hepatobiliary: A lentiform fluid collection along the superolateral aspect of the liver deforms the liver capsule but is smaller than previously. There is air in the fluid which was seen previously. The liver is otherwise unremarkable without contrast. The gallbladder and bile ducts are unremarkable. There is loss of fine detail through the liver due to overlying wires and external metallic monitoring hardware. Pancreas: Partially atrophic. Otherwise unremarkable without contrast. Spleen: Unremarkable without contrast.  No splenomegaly. Adrenals/Urinary Tract: There is no adrenal mass. Both kidneys slightly small in length both measuring 8.5 cm in length. There is no contour deforming mass of either of the unenhanced kidneys and no urinary stone or obstruction. The bladder is unremarkable for the degree of distension. Stomach/Bowel: Unremarkable stomach. Unremarkable small bowel. An appendix is not seen. There is interval worsening circumferential wall thickening in the rectum concerning for proctitis, not present on the prior studies. Infiltrating disease is less likely given the short interval time frame of its appearance. Rest of the colon wall is normal in thickness. Vascular/Lymphatic: Aortic atherosclerosis. No enlarged abdominal or pelvic lymph nodes. Reproductive: No prostatomegaly. Other: In the ventral hypogastrium, a loculated fluid collection is again noted and smaller than previously, today measuring 17.2 x 3.9 cm on 3:66, at a similar level previously was 23 by 7.8 cm and no longer contains air. This is connected over a thin fluid-filled channel extending over the anterior right upper quadrant and along lateral aspect of the liver up to a lentiform perihepatic collection extending over the dome and lateral aspect of the right lobe, on 3:13 measuring 8.4 x 4.1 cm,  previously 12.3 x 4.9 cm. There are pockets of air in the anterior aspect of this second collection and a more well-defined wall around it. No free air is seen outside of these collections and no other abdominal or pelvic fluid. No abdominal wall hernia. Musculoskeletal: Advanced bridging enthesopathy thoracic and lumbar spine. Moderate hip DJD. Small calcified disc extrusion L3-4 centrally. IMPRESSION: 1. Interval worsening circumferential wall thickening in the rectum concerning for proctitis. Infiltrating disease is less likely given the short interval time frame of its appearance. 2. Thinly connected loculated fluid collections in the ventral hypogastrium and perihepatic region, smaller than previously. 3. There are pockets of air in the anterior aspect of the perihepatic collection, with infectious process not excluded. The perihepatic collection deforms the liver capsule but not as much as previously. 4. No urinary stone or obstruction.  The 5. Small hiatal hernia with increased thickening in the distal thoracic esophagus. Endoscopy is recommended. 6. Aortic atherosclerosis. 7. New demonstration of a partially visualized double-lumen catheter presumably for dialysis, extending through the right atrium and terminating in the intrahepatic IVC. Aortic Atherosclerosis (ICD10-I70.0). Electronically Signed   By: Denman Fischer M.D.   On: 01/16/2024 03:38   US  Abdomen Limited RUQ (LIVER/GB) Result Date: 01/05/2024 CLINICAL DATA:  [id number].  Abnormal liver enzymes. EXAM: ULTRASOUND ABDOMEN LIMITED RIGHT UPPER QUADRANT COMPARISON:  CT without contrast 12/27/2023 FINDINGS: Gallbladder: No gallstones or wall thickening visualized. No sonographic Murphy sign noted by sonographer. Common bile duct: Diameter: 4.7 mm.  No intrahepatic bile duct dilatation. Liver: No focal lesion identified. Within normal limits in parenchymal echogenicity. Portal vein is patent on color Doppler imaging with normal direction of blood flow  towards the liver. Other: Complex fluid with internal echoes collects along side the right lobe of the liver corresponding to the perihepatic loculated fluid on CT. IMPRESSION: 1. Complex fluid with internal echoes collecting along side the right lobe of the liver corresponding to the perihepatic loculated fluid on CT. 2. No gallstones, wall thickening or biliary dilatation. Electronically Signed   By: Denman Fischer M.D.   On: 01/05/2024 06:04   DG C-Arm 1-60 Min Result Date: 12/30/2023 CLINICAL DATA:  Dialysis catheter exchange EXAM: DG C-ARM 1-60 MIN FLUOROSCOPY: Fluoroscopy Time:  29 seconds Radiation Exposure Index (if provided by the fluoroscopic device): 4.57 mGy Number of Acquired Spot Images: 0 COMPARISON:  None Available. FINDINGS: Two intraoperative spot images demonstrate left dialysis catheter. The tip is in the right atrium. IMPRESSION: As above. Electronically Signed   By: Janeece Mechanic M.D.   On: 12/30/2023 13:05   VAS US  UPPER EXT VEIN MAPPING (PRE-OP  AVF) Result Date: 12/29/2023 UPPER EXTREMITY VEIN MAPPING Patient Name:  Astin A Blowers  Date of Exam:   12/29/2023 Medical Rec #: [id number]            Accession #:    [id number] Date of Birth: June 21, 1951           Patient Gender: M Patient Age:   7 years Exam Location:  [facility] Procedure:      VAS US  UPPER EXT VEIN MAPPING (PRE-OP  AVF) Referring Phys: Hilario Lover PEEPLES --------------------------------------------------------------------------------  Indications: Pre-access. History: ESRD, failed peritoneal dialysis secondary to infection. CVA 2016, now          with residual right hemiparesis.  Limitations: Right arm hemiparesis, IV, bandages, tissue properties, edema Comparison Study: No prior mapping on file Performing Technologist: Carleene Chase RVS  Examination Guidelines: A complete evaluation includes B-mode imaging, spectral Doppler, color Doppler, and power Doppler as needed of all accessible portions of each vessel.  Bilateral testing is considered an integral part of a complete examination. Limited examinations for reoccurring indications may be performed as noted. +-----------------+-------------+----------+--------------+ Right Cephalic   Diameter (cm)Depth (cm)   Findings    +-----------------+-------------+----------+--------------+ Prox upper arm       0.16        0.40                  +-----------------+-------------+----------+--------------+ Mid upper arm        0.12        0.38                  +-----------------+-------------+----------+--------------+ Dist upper arm       0.11        0.42                  +-----------------+-------------+----------+--------------+  Antecubital fossa                       not visualized +-----------------+-------------+----------+--------------+ Prox forearm                            not visualized +-----------------+-------------+----------+--------------+ Mid forearm                             not visualized +-----------------+-------------+----------+--------------+ Dist forearm                            not visualized +-----------------+-------------+----------+--------------+ Wrist                                   not visualized +-----------------+-------------+----------+--------------+ +-----------------+-------------+----------+--------------+ Right Basilic    Diameter (cm)Depth (cm)   Findings    +-----------------+-------------+----------+--------------+ Prox upper arm                          not visualized +-----------------+-------------+----------+--------------+ Mid upper arm                           not visualized +-----------------+-------------+----------+--------------+ Dist upper arm                          not visualized +-----------------+-------------+----------+--------------+ Antecubital fossa    0.25        0.85                   +-----------------+-------------+----------+--------------+ Prox forearm         0.18        1.10                  +-----------------+-------------+----------+--------------+ Mid forearm                             not visualized +-----------------+-------------+----------+--------------+ Distal forearm                          not visualized +-----------------+-------------+----------+--------------+ Wrist                                   not visualized +-----------------+-------------+----------+--------------+ +-----------------+-------------+----------+---------------------+ Left Cephalic    Diameter (cm)Depth (cm)      Findings        +-----------------+-------------+----------+---------------------+ Prox upper arm                             not visualized     +-----------------+-------------+----------+---------------------+ Mid upper arm                              not visualized     +-----------------+-------------+----------+---------------------+ Dist upper arm                             not visualized     +-----------------+-------------+----------+---------------------+ Antecubital fossa    0.32        0.17                         +-----------------+-------------+----------+---------------------+  Prox forearm         0.18        0.29                         +-----------------+-------------+----------+---------------------+ Mid forearm          0.20        0.25                         +-----------------+-------------+----------+---------------------+ Dist forearm         0.26        0.22                         +-----------------+-------------+----------+---------------------+ Wrist                                   not visualized and IV +-----------------+-------------+----------+---------------------+ +-----------------+-------------+----------+---------------------+ Left Basilic     Diameter (cm)Depth (cm)      Findings         +-----------------+-------------+----------+---------------------+ Prox upper arm                             not visualized     +-----------------+-------------+----------+---------------------+ Mid upper arm      0.26/0.10  1.25/0.79                       +-----------------+-------------+----------+---------------------+ Dist upper arm       0.33        0.81         branching       +-----------------+-------------+----------+---------------------+ Antecubital fossa    0.29        0.64                         +-----------------+-------------+----------+---------------------+ Prox forearm         0.24        0.85                         +-----------------+-------------+----------+---------------------+ Mid forearm          0.24        1.39                         +-----------------+-------------+----------+---------------------+ Distal forearm                          not visualized and IV +-----------------+-------------+----------+---------------------+ Wrist                                   not visualized and IV +-----------------+-------------+----------+---------------------+ *See table(s) above for measurements and observations.  Diagnosing physician: Jimmye Moulds MD Electronically signed by Jimmye Moulds MD on 12/29/2023 at 6:16:56 PM.    Final    IR Fluoro Guide CV Line Left Result Date: 12/27/2023 INDICATION: Acute renal failure, no current access for dialysis EXAM: ULTRASOUND FLUOROSCOPIC TEMPORARY LEFT IJ DIALYSIS CATHETER MEDICATIONS: 1% LIDOCAINE  LOCAL ANESTHESIA/SEDATION: Total intra-service moderate Sedation Time: None. The patient's level of consciousness and vital signs were monitored continuously by radiology nursing throughout the procedure under my direct supervision. FLUOROSCOPY: Radiation Exposure Index (as provided by  the fluoroscopic device): 2.0 mGy Kerma COMPLICATIONS: None immediate. PROCEDURE: Informed written consent was obtained from  the patient after a thorough discussion of the procedural risks, benefits and alternatives. All questions were addressed. Maximal Sterile Barrier Technique was utilized including caps, mask, sterile gowns, sterile gloves, sterile drape, hand hygiene and skin antiseptic. A timeout was performed prior to the initiation of the procedure. Under sterile conditions and local anesthesia, left IJ micropuncture access performed. Images obtained for documentation of the patent left IJV. 018 guidewire advanced centrally followed by the micro dilator set. Measurements obtained for the appropriate length. Amplatz guidewire inserted. Tract dilatation performed to insert a 20 cm Mahurkar type temporary dialysis catheter. Tip position SVC RA junction. Catheter secured with silk sutures and a sterile dressing. Blood aspirated easily followed by saline and heparin  flushes. External caps applied. No immediate complication. Patient tolerated the procedure well. IMPRESSION: Successful ultrasound and fluoroscopic left IJ temporary dialysis catheter. Tip SVC RA junction. Ready for use. Electronically Signed   By: Melven Stable.  Shick M.D.   On: 12/27/2023 16:10   IR US  Guide Vasc Access Left Result Date: 12/27/2023 INDICATION: Acute renal failure, no current access for dialysis EXAM: ULTRASOUND FLUOROSCOPIC TEMPORARY LEFT IJ DIALYSIS CATHETER MEDICATIONS: 1% LIDOCAINE  LOCAL ANESTHESIA/SEDATION: Total intra-service moderate Sedation Time: None. The patient's level of consciousness and vital signs were monitored continuously by radiology nursing throughout the procedure under my direct supervision. FLUOROSCOPY: Radiation Exposure Index (as provided by the fluoroscopic device): 2.0 mGy Kerma COMPLICATIONS: None immediate. PROCEDURE: Informed written consent was obtained from the patient after a thorough discussion of the procedural risks, benefits and alternatives. All questions were addressed. Maximal Sterile Barrier Technique was utilized  including caps, mask, sterile gowns, sterile gloves, sterile drape, hand hygiene and skin antiseptic. A timeout was performed prior to the initiation of the procedure. Under sterile conditions and local anesthesia, left IJ micropuncture access performed. Images obtained for documentation of the patent left IJV. 018 guidewire advanced centrally followed by the micro dilator set. Measurements obtained for the appropriate length. Amplatz guidewire inserted. Tract dilatation performed to insert a 20 cm Mahurkar type temporary dialysis catheter. Tip position SVC RA junction. Catheter secured with silk sutures and a sterile dressing. Blood aspirated easily followed by saline and heparin  flushes. External caps applied. No immediate complication. Patient tolerated the procedure well. IMPRESSION: Successful ultrasound and fluoroscopic left IJ temporary dialysis catheter. Tip SVC RA junction. Ready for use. Electronically Signed   By: Melven Stable.  Shick M.D.   On: 12/27/2023 16:10     Discharge Exam: Vitals:   01/26/24 0856 01/26/24 0957  BP: (!) 169/85 (!) 169/85  Pulse: 91 91  Resp:    Temp:    SpO2:     Vitals:   01/26/24 0743 01/26/24 0756 01/26/24 0856 01/26/24 0957  BP: (!) 170/100 (!) 170/100 (!) 169/85 (!) 169/85  Pulse: 84  91 91  Resp: 14     Temp: 98.9 F (37.2 C)     TempSrc:      SpO2: 100%     Weight:      Height:        General: Pt is alert, awake, not in acute distress Cardiovascular: RRR, S1/S2 +, no rubs, no gallops Respiratory: CTA bilaterally, no wheezing, no rhonchi Abdominal: Soft, NT, ND, bowel sounds + Extremities: no edema, no cyanosis    The results of significant diagnostics from this hospitalization (including imaging, microbiology, ancillary and laboratory) are listed below for reference.     Microbiology:  No results found for this or any previous visit (from the past 240 hours).   Labs: BNP (last 3 results) No results for input(s): "BNP" in the last 8760  hours. Basic Metabolic Panel: Recent Labs  Lab 01/25/24 1014 01/26/24 0504  NA 139 141  K 3.2* 2.7*  CL 95* 102  CO2 29 29  GLUCOSE 387* 148*  BUN 19 20  CREATININE 2.70* 2.46*  CALCIUM  8.4* 7.6*   Liver Function Tests: Recent Labs  Lab 01/25/24 1014 01/26/24 0504  AST 26 19  ALT 22 19  ALKPHOS 149* 117  BILITOT 1.1 1.1  PROT 7.8 7.0  ALBUMIN  2.5* 2.2*   Recent Labs  Lab 01/25/24 1014  LIPASE 25   No results for input(s): "AMMONIA" in the last 168 hours. CBC: Recent Labs  Lab 01/25/24 1014 01/26/24 0504  WBC 8.0 7.5  HGB 11.3* 10.7*  HCT 36.9* 34.5*  MCV 86.2 85.2  PLT 335 330   Cardiac Enzymes: No results for input(s): "CKTOTAL", "CKMB", "CKMBINDEX", "TROPONINI" in the last 168 hours. BNP: Invalid input(s): "POCBNP" CBG: Recent Labs  Lab 01/25/24 1641 01/25/24 2009 01/26/24 0739  GLUCAP 288* 202* 143*   D-Dimer No results for input(s): "DDIMER" in the last 72 hours. Hgb A1c No results for input(s): "HGBA1C" in the last 72 hours. Lipid Profile No results for input(s): "CHOL", "HDL", "LDLCALC", "TRIG", "CHOLHDL", "LDLDIRECT" in the last 72 hours. Thyroid  function studies No results for input(s): "TSH", "T4TOTAL", "T3FREE", "THYROIDAB" in the last 72 hours.  Invalid input(s): "FREET3" Anemia work up No results for input(s): "VITAMINB12", "FOLATE", "FERRITIN", "TIBC", "IRON", "RETICCTPCT" in the last 72 hours. Urinalysis    Component Value Date/Time   COLORURINE YELLOW 01/25/2024 1256   APPEARANCEUR CLEAR 01/25/2024 1256   LABSPEC 1.009 01/25/2024 1256   PHURINE 7.0 01/25/2024 1256   GLUCOSEU >=500 (A) 01/25/2024 1256   HGBUR SMALL (A) 01/25/2024 1256   HGBUR trace-lysed 01/26/2010 0900   BILIRUBINUR NEGATIVE 01/25/2024 1256   BILIRUBINUR neg 10/30/2018 0949   KETONESUR NEGATIVE 01/25/2024 1256   PROTEINUR >=300 (A) 01/25/2024 1256   UROBILINOGEN 0.2 10/30/2018 0949   UROBILINOGEN 0.2 01/26/2010 0900   NITRITE NEGATIVE 01/25/2024 1256    LEUKOCYTESUR NEGATIVE 01/25/2024 1256   Sepsis Labs Recent Labs  Lab 01/25/24 1014 01/26/24 0504  WBC 8.0 7.5   Microbiology No results found for this or any previous visit (from the past 240 hours).  FURTHER DISCHARGE INSTRUCTIONS:   Get Medicines reviewed and adjusted: Please take all your medications with you for your next visit with your Primary MD   Laboratory/radiological data: Please request your Primary MD to go over all hospital tests and procedure/radiological results at the follow up, please ask your Primary MD to get all Hospital records sent to his/her office.   In some cases, they will be blood work, cultures and biopsy results pending at the time of your discharge. Please request that your primary care M.D. goes through all the records of your hospital data and follows up on these results.   Also Note the following: If you experience worsening of your admission symptoms, develop shortness of breath, life threatening emergency, suicidal or homicidal thoughts you must seek medical attention immediately by calling 911 or calling your MD immediately  if symptoms less severe.   You must read complete instructions/literature along with all the possible adverse reactions/side effects for all the Medicines you take and that have been prescribed to you. Take any new Medicines after you have completely  understood and accpet all the possible adverse reactions/side effects.    Do not drive when taking Pain medications or sleeping medications (Benzodaizepines)   Do not take more than prescribed Pain, Sleep and Anxiety Medications. It is not advisable to combine anxiety,sleep and pain medications without talking with your primary care practitioner   Special Instructions: If you have smoked or chewed Tobacco  in the last 2 yrs please stop smoking, stop any regular Alcohol  and or any Recreational drug use.   Wear Seat belts while driving.   Please note: You were cared for by a  hospitalist during your hospital stay. Once you are discharged, your primary care physician will handle any further medical issues. Please note that NO REFILLS for any discharge medications will be authorized once you are discharged, as it is imperative that you return to your primary care physician (or establish a relationship with a primary care physician if you do not have one) for your post hospital discharge needs so that they can reassess your need for medications and monitor your lab values  Time coordinating discharge: Over 30 minutes  SIGNED:   Modena Andes, MD  [facility] 01/26/2024, 11:09 AM *Please note that this is a verbal dictation therefore any spelling or grammatical errors are due to the "[employer]agon [employer] One" system interpretation. If 7PM-7AM, please contact night-coverage [email or web address]

## 2024-01-26 NOTE — Progress Notes (Signed)
   01/26/24 1207  AVS Discharge Documentation  AVS Discharge Instructions Including Medications Provided to patient/caregiver  Name of Person Receiving AVS Discharge Instructions Including Medications Russell Austin  Name of Clinician That Reviewed AVS Discharge Instructions Including Medications Synthia Ewing RN     AVS reviewed with patient. All questions answered all personal belongs returned.

## 2024-01-26 NOTE — Progress Notes (Signed)
 Discharged delayed secondary to IV medication.

## 2024-01-28 ENCOUNTER — Telehealth: Payer: Self-pay | Admitting: Physician Assistant

## 2024-01-28 DIAGNOSIS — T8249XD Other complication of vascular dialysis catheter, subsequent encounter: Secondary | ICD-10-CM | POA: Diagnosis not present

## 2024-01-28 DIAGNOSIS — N2581 Secondary hyperparathyroidism of renal origin: Secondary | ICD-10-CM | POA: Diagnosis not present

## 2024-01-28 DIAGNOSIS — D689 Coagulation defect, unspecified: Secondary | ICD-10-CM | POA: Diagnosis not present

## 2024-01-28 DIAGNOSIS — D631 Anemia in chronic kidney disease: Secondary | ICD-10-CM | POA: Diagnosis not present

## 2024-01-28 DIAGNOSIS — N186 End stage renal disease: Secondary | ICD-10-CM | POA: Diagnosis not present

## 2024-01-28 DIAGNOSIS — Z992 Dependence on renal dialysis: Secondary | ICD-10-CM | POA: Diagnosis not present

## 2024-01-28 NOTE — Telephone Encounter (Signed)
 Transition of Care - Initial Contact after Hospitalization  Date of discharge:  01/26/24 Date of contact: 01/28/24  Method: Phone Spoke to: Patient's wife  Patient contacted to discuss transition of care from recent inpatient hospitalization. Patient was admitted to [facility] from 01/25/24 to 5/25/25with discharge diagnosis of: fecal impaction, constipation, nausea. She reports he is not 100% but is improving every day.  The discharge medication list was reviewed. Noted carafate  on med list, she reports that is from last admission. Will stop it since he is an HD patient.   Patient will return to his/her outpatient HD unit on: 01/28/24 (at HD currently)  No other concerns at this time.  Ramona Burner, PA-C 01/28/2024, 1:21 PM  [facility] Pager: [phone]

## 2024-01-30 ENCOUNTER — Encounter (INDEPENDENT_AMBULATORY_CARE_PROVIDER_SITE_OTHER): Admitting: Family Medicine

## 2024-01-30 NOTE — Progress Notes (Signed)
 error

## 2024-02-01 DIAGNOSIS — Z992 Dependence on renal dialysis: Secondary | ICD-10-CM | POA: Diagnosis not present

## 2024-02-01 DIAGNOSIS — N186 End stage renal disease: Secondary | ICD-10-CM | POA: Diagnosis not present

## 2024-02-01 DIAGNOSIS — E1122 Type 2 diabetes mellitus with diabetic chronic kidney disease: Secondary | ICD-10-CM | POA: Diagnosis not present

## 2024-02-03 DIAGNOSIS — M21371 Foot drop, right foot: Secondary | ICD-10-CM | POA: Diagnosis not present

## 2024-02-03 DIAGNOSIS — G8191 Hemiplegia, unspecified affecting right dominant side: Secondary | ICD-10-CM | POA: Diagnosis not present

## 2024-02-04 ENCOUNTER — Ambulatory Visit (INDEPENDENT_AMBULATORY_CARE_PROVIDER_SITE_OTHER)

## 2024-02-04 ENCOUNTER — Other Ambulatory Visit: Payer: Self-pay | Admitting: *Deleted

## 2024-02-04 DIAGNOSIS — M2142 Flat foot [pes planus] (acquired), left foot: Secondary | ICD-10-CM | POA: Diagnosis not present

## 2024-02-04 DIAGNOSIS — N2581 Secondary hyperparathyroidism of renal origin: Secondary | ICD-10-CM | POA: Diagnosis not present

## 2024-02-04 DIAGNOSIS — M2141 Flat foot [pes planus] (acquired), right foot: Secondary | ICD-10-CM | POA: Diagnosis not present

## 2024-02-04 DIAGNOSIS — M2042 Other hammer toe(s) (acquired), left foot: Secondary | ICD-10-CM | POA: Diagnosis not present

## 2024-02-04 DIAGNOSIS — Z794 Long term (current) use of insulin: Secondary | ICD-10-CM | POA: Diagnosis not present

## 2024-02-04 DIAGNOSIS — M2170 Unequal limb length (acquired), unspecified site: Secondary | ICD-10-CM

## 2024-02-04 DIAGNOSIS — E1140 Type 2 diabetes mellitus with diabetic neuropathy, unspecified: Secondary | ICD-10-CM

## 2024-02-04 DIAGNOSIS — N186 End stage renal disease: Secondary | ICD-10-CM | POA: Diagnosis not present

## 2024-02-04 DIAGNOSIS — L84 Corns and callosities: Secondary | ICD-10-CM

## 2024-02-04 DIAGNOSIS — M2041 Other hammer toe(s) (acquired), right foot: Secondary | ICD-10-CM | POA: Diagnosis not present

## 2024-02-04 DIAGNOSIS — Z992 Dependence on renal dialysis: Secondary | ICD-10-CM | POA: Diagnosis not present

## 2024-02-04 DIAGNOSIS — D689 Coagulation defect, unspecified: Secondary | ICD-10-CM | POA: Diagnosis not present

## 2024-02-04 DIAGNOSIS — D631 Anemia in chronic kidney disease: Secondary | ICD-10-CM | POA: Diagnosis not present

## 2024-02-04 DIAGNOSIS — N185 Chronic kidney disease, stage 5: Secondary | ICD-10-CM

## 2024-02-04 DIAGNOSIS — T8249XD Other complication of vascular dialysis catheter, subsequent encounter: Secondary | ICD-10-CM | POA: Diagnosis not present

## 2024-02-04 NOTE — Progress Notes (Signed)
 Patient presents today to pick up diabetic shoes and insoles.  Patient was dispensed 1 pair of diabetic shoes and 2 pairs of foam casted diabetic insoles right shoe being sent to [facility] for 3/4" lift to be added to accommodate LLD and restore symmetry and balance Fit was satisfactory. Instructions for break-in and wear was reviewed and a copy was given to the patient.   Re-appointment for regularly scheduled diabetic foot care visits or if they should experience any trouble with the shoes or insoles.  Charges added today patient wife will PU when shoe is returned from [facility]

## 2024-02-05 ENCOUNTER — Telehealth: Payer: Self-pay

## 2024-02-05 DIAGNOSIS — H40012 Open angle with borderline findings, low risk, left eye: Secondary | ICD-10-CM | POA: Diagnosis not present

## 2024-02-05 LAB — HM DIABETES EYE EXAM

## 2024-02-05 NOTE — Telephone Encounter (Signed)
 Copied from CRM [id number]. Topic: General - Other >> Feb 05, 2024  3:15 PM Caliyah H wrote: Reason for CRM: New,( is her name)  RN and Case Manager from [employer], called regarding the patient. She stated that the patient was recently discharged from [facility] and she spoke with the patient's wife about custodial care. The patient's wife is in agreement and is requesting that an order be placed and sent to [facility].  Case Manager Callback Number: [phone] [facility] [address], [place], [place] [address] [phone]

## 2024-02-06 NOTE — Telephone Encounter (Signed)
**Note De-identified  [place] Obfuscation** Please advise 

## 2024-02-07 ENCOUNTER — Telehealth: Payer: Self-pay

## 2024-02-07 ENCOUNTER — Other Ambulatory Visit: Payer: Self-pay | Admitting: Adult Health

## 2024-02-07 ENCOUNTER — Ambulatory Visit

## 2024-02-07 VITALS — Ht 69.0 in | Wt 175.0 lb

## 2024-02-07 DIAGNOSIS — Z0000 Encounter for general adult medical examination without abnormal findings: Secondary | ICD-10-CM | POA: Diagnosis not present

## 2024-02-07 NOTE — Progress Notes (Signed)
 Subjective:   Russell Austin is a 73 y.o. who presents for a Medicare Wellness preventive visit.  As a reminder, Annual Wellness Visits don't include a physical exam, and some assessments may be limited, especially if this visit is performed virtually. We may recommend an in-person follow-up visit with your provider if needed.  Visit Complete: Virtual I connected with  Russell Austin on 02/07/24 by a audio enabled telemedicine application and verified that I am speaking with the correct person using two identifiers.  Patient Location: Home  Provider Location: Home Office  I discussed the limitations of evaluation and management by telemedicine. The patient expressed understanding and agreed to proceed.  Vital Signs: Because this visit was a virtual/telehealth visit, some criteria may be missing or patient reported. Any vitals not documented were not able to be obtained and vitals that have been documented are patient reported.    Persons Participating in Visit: Patient.  AWV Questionnaire: No: Patient Medicare AWV questionnaire was not completed prior to this visit.  Cardiac Risk Factors include: advanced age (>38men, >79 women);diabetes mellitus;hypertension;sedentary lifestyle     Objective:     Today's Vitals   02/07/24 1006  Weight: 175 lb (79.4 kg)  Height: 5\' 9"  (1.753 m)  PainSc: 0-No pain   Body mass index is 25.84 kg/m.     02/07/2024   10:16 AM 01/26/2024    3:35 PM 01/25/2024   10:04 AM 01/16/2024    3:43 AM 01/15/2024    9:54 PM 12/27/2023    5:33 AM 09/16/2023   12:12 PM  Advanced Directives  Does Patient Have a Medical Advance Directive? Yes  Yes No No No No  Type of Estate agent of [place];Living will Healthcare Power of [employer] of [place];Living will      Copy of Healthcare Power of Attorney in Chart? No - copy requested No - copy requested No - copy requested, Physician notified      Would patient like  information on creating a medical advance directive?      No - Patient declined     Current Medications (verified) Outpatient Encounter Medications as of 02/07/2024  Medication Sig   acetaminophen  (TYLENOL ) 325 MG tablet Take 2 tablets (650 mg total) by mouth every 6 (six) hours as needed for mild pain (pain score 1-3) (or Fever >/= 101).   albuterol  (PROVENTIL ) (2.5 MG/3ML) 0.083% nebulizer solution Take 3 mLs (2.5 mg total) by nebulization every 6 (six) hours as needed for wheezing or shortness of breath.   amLODipine  (NORVASC ) 10 MG tablet Take 1 tablet (10 mg total) by mouth daily.   aspirin  EC 81 MG tablet Take 81 mg by mouth daily.   [Paused] atorvastatin  (LIPITOR) 10 MG tablet Take 1 tablet (10 mg total) by mouth every morning.   buPROPion  (WELLBUTRIN  XL) 300 MG 24 hr tablet TAKE 1 TABLET (300 MG TOTAL) BY MOUTH EVERY MORNING.   calcitRIOL  (ROCALTROL ) 0.5 MCG capsule Take 1 capsule (0.5 mcg total) by mouth daily.   chlorproMAZINE  (THORAZINE ) 25 MG tablet TAKE 1 TABLET BY MOUTH THREE TIMES A DAY (Patient taking differently: Take 25 mg by mouth daily as needed for hiccoughs.)   Continuous Glucose Sensor (FREESTYLE LIBRE 3 PLUS SENSOR) MISC Change sensor every 15 days. (Patient taking differently: Inject 1 Device into the skin every 14 (fourteen) days.)   gabapentin  (NEURONTIN ) 300 MG capsule TAKE ONE CAPSULE BY MOUTH EVERYDAY AT BEDTIME (Patient taking differently: Take 300 mg by mouth at  bedtime.)   gentamicin  cream (GARAMYCIN ) 0.1 % Apply 1 Application topically daily.   insulin  aspart (NOVOLOG  FLEXPEN) 100 UNIT/ML FlexPen Inject 5 Units into the skin 3 (three) times daily with meals. (Patient taking differently: Inject 5 Units into the skin daily.)   insulin  glargine (LANTUS  SOLOSTAR) 100 UNIT/ML Solostar Pen INJECT 20 UNITS INTO THE SKIN DAILY (Patient taking differently: Inject 15 Units into the skin every evening.)   lactulose (CHRONULAC) 10 GM/15ML solution Take by mouth daily as needed  for mild constipation or moderate constipation.   losartan  (COZAAR ) 100 MG tablet TAKE 1 TABLET BY MOUTH EVERY DAY IN THE MORNING (Patient taking differently: Take 100 mg by mouth daily.)   metoCLOPramide  (REGLAN ) 10 MG tablet TAKE 1 TABLET BY MOUTH 4 TIMES DAILY. (Patient taking differently: Take 10 mg by mouth daily as needed for nausea or vomiting.)   metoprolol  tartrate (LOPRESSOR ) 100 MG tablet Take 1 tablet (100 mg total) by mouth 2 (two) times daily.   pantoprazole  (PROTONIX ) 40 MG tablet Take 1 tablet (40 mg total) by mouth daily.   potassium chloride  SA (KLOR-CON  M) 20 MEQ tablet Take 1 tablet (20 mEq total) by mouth daily.   promethazine  (PHENERGAN ) 12.5 MG tablet Take 1 tablet (12.5 mg total) by mouth every 8 (eight) hours as needed for nausea or vomiting.   sucralfate  (CARAFATE ) 1 GM/10ML suspension Take 10 mLs (1 g total) by mouth 4 (four) times daily -  with meals and at bedtime.   No facility-administered encounter medications on file as of 02/07/2024.    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)    Chronic kidney disease    Diabetes mellitus type II    Diabetic retinopathy FOLLOWED BY DR Seward Dao   ED (erectile dysfunction)    Glaucoma of both eyes    Hypertension    Hypertensive urgency 12/27/2023   Left hydrocele    Stroke [facility])    Past Surgical History:  Procedure Laterality Date   APPENDECTOMY  AGE EARLY 20'S   CAPD INSERTION N/A 04/11/2023   Procedure: LAPAROSCOPIC INSERTION CONTINUOUS AMBULATORY PERITONEAL DIALYSIS  (CAPD) CATHETER WITH  OMENTOPEXY;  Surgeon: Carlene Che, MD;  Location: [facility] OR;  Service: Vascular;  Laterality: N/A;   CAPD REMOVAL N/A 12/30/2023   Procedure:  CONTINUOUS AMBULATORY PERITONEAL DIALYSIS  (CAPD) CATHETER REMOVAL;  Surgeon: Kayla Part, MD;  Location[facility] OR;  Service: Vascular;  Laterality: N/A;   COLONOSCOPY  12/30/2020   every 5 years   HYDROCELE EXCISION  03/31/2012   Procedure: HYDROCELECTOMY ADULT;  Surgeon: Trent Frizzle, MD;  Location: [facility];  Service: Urology;  Laterality: Left;  45 MINS     INSERTION OF DIALYSIS CATHETER N/A 12/30/2023   Procedure: EXCHANGE OF DIALYSIS CATHETER;  Surgeon: Kayla Part, MD;  Location: [facility] OR;  Service: Vascular;  Laterality: N/A;   IR FLUORO GUIDE CV LINE LEFT  12/27/2023   IR US  GUIDE VASC ACCESS LEFT  12/27/2023   LEFT EYE LASER RETINA REPAIR  SEPT 2012   RIGHT EYE VITRECTOMY/ INSERTION GLAUCOMA SETON/ LASER REPAIR  12-13-2008   RETINAL ARTERY OCCLUSION /NEOVASCULAR GLAUCOMA/ HEMORRHAGE   RIGHT EYE VITRETOMY/ INSERTION GLAUCOMA SETON X2/ LASER  03-24-2009   RECURRENT  HEMORRHAGE/ OCCLUSION INTERNAL SETON   SHOULDER ARTHROSCOPY Right 2005   undescended right testicle removed  1994   Family History  Problem Relation Age of Onset   Glaucoma Mother    Diabetes Mother    Stomach cancer Maternal Grandfather    Stroke Maternal Grandmother    Social History   Socioeconomic History   Marital status: Married    Spouse name: Not on file   Number of children: 1   Years of education: 12   Highest education level: 12th grade  Occupational History   Occupation: Disabled  Tobacco Use   Smoking status: Never   Smokeless tobacco: Never  Vaping Use   Vaping status: Never Used  Substance and Sexual Activity   Alcohol use: No   Drug use: No   Sexual activity: Not on file  Other Topics Concern   Not on file  Social History Narrative   Lives at home with his wife and granddaughter.   Left-handed.   3 cups caffeine per day.   Social Drivers of Corporate investment banker Strain: Low Risk  (02/07/2024)   Overall Financial Resource Strain (CARDIA)    Difficulty  of Paying Living Expenses: Not hard at all  Food Insecurity: No Food Insecurity (02/07/2024)   Hunger Vital Sign    Worried About Running Out of Food in the Last Year: Never true    Ran Out of Food in the Last Year: Never true  Transportation Needs: No Transportation Needs (02/07/2024)   PRAPARE - Administrator, Civil Service (Medical): No    Lack of Transportation (Non-Medical): No  Physical Activity: Insufficiently Active (02/07/2024)   Exercise Vital Sign    Days of Exercise per Week: 2 days    Minutes of Exercise per Session: 20 min  Stress: No Stress Concern Present (02/07/2024)   [employer] of Occupational Health - Occupational Stress Questionnaire    Feeling of Stress : Not at all  Social Connections: Socially Integrated (02/07/2024)   Social Connection and Isolation Panel [NHANES]    Frequency of Communication with Friends and Family: More than three times a week    Frequency of Social Gatherings with Friends and Family: More than three times a week    Attends Religious Services: More than 4 times per year    Active Member of [employer] or Organizations: Yes    Attends Engineer, structural: More than 4 times per year    Marital Status: Married    Tobacco Counseling Counseling given: Not Answered    Clinical Intake:  Pre-visit preparation completed: Yes  Pain : No/denies pain Pain Score: 0-No pain     BMI - recorded: 25.84 Nutritional Status: BMI 25 -29 Overweight Nutritional Risks: None Diabetes: Yes CBG done?: No Did pt. bring in CBG monitor from home?: No  Lab Results  Component Value Date   HGBA1C 6.8 (A) 12/10/2023   HGBA1C 9.5 (A) 08/29/2023   HGBA1C 10.2 (A) 05/30/2023     How often do you need to have someone help you when you read instructions, pamphlets, or other written materials from your doctor or pharmacy?: 1 - Never  Interpreter Needed?: No  Information entered by :: Farris Hong LPN   Activities of Daily Living      02/07/2024   10:13 AM 01/26/2024    3:32 PM  In your present state of health, do you have any difficulty performing the following activities:  Hearing? 0   Vision? 0  Difficulty concentrating or making decisions? 0   Walking or climbing stairs? 1   Comment Uses a Reginald Capri, and [employer] or bathing? 0   Doing errands, shopping? 1 0  Comment Wife assist   Preparing Food and eating ? N   Comment Wife assist   Using the Toilet? N   In the past six months, have you accidently leaked urine? N   Do you have problems with loss of bowel control? N   Managing your Medications? N   Managing your Finances? N   Housekeeping or managing your Housekeeping? N     Patient Care Team: Alto Atta, NP as PCP - General (Family Medicine) Seabron Cypress, Timmothy Foots, MD as Consulting Physician (Allergy and Immunology) [facility] (Ophthalmology) Thor Fling, MD (Ophthalmology) Ali Antonio Brandy Cal, MD as Consulting Physician (Neurosurgery) Cindra Cree Bruna Capers, MD as Consulting Physician (Nephrology) [facility] (Plastic Surgery) Carnell Christian, [facility] (Pharmacist)  I have updated your Care Teams any recent Medical Services you may have received from other providers in the past year.     Assessment:    This is a routine wellness examination for [place].  Hearing/Vision screen Hearing Screening - Comments:: Denies hearing difficulties   Vision Screening - Comments:: Wears rx glasses - up to date with routine eye exams with  Dr Ellene Gustin   Goals Addressed               This Visit's Progress     Increase physical activity (pt-stated)        Remain active.       Depression Screen     02/07/2024   10:12 AM 02/28/2023    7:12 AM 01/21/2023   12:40 PM 01/08/2023    8:36 AM 11/29/2022    7:12 AM 08/30/2022    7:52 AM 04/12/2022   10:09 AM  PHQ 2/9 Scores  PHQ - 2 Score 0 0 0 1 0 6 4  PHQ- 9 Score  4 0 14 7 16 16     Fall Risk      02/07/2024   10:15 AM 02/28/2023    7:10 AM 01/21/2023   12:41 PM 01/08/2023    8:35 AM 06/11/2022    6:22 PM  Fall Risk   Falls in the past year? 1 1 1  Exclusion - non ambulatory 1  Number falls in past yr: 0 1 1 1 1   Injury with Fall? 0 0 0 1 1  Risk for fall due to : Impaired mobility History of fall(s) No Fall Risks;Impaired balance/gait Orthopedic patient   Follow up Falls evaluation completed Falls evaluation completed Falls prevention discussed      MEDICARE RISK AT HOME:  Medicare Risk at Home Any stairs in or around the home?: Yes If so, are there any without handrails?: No Home free of loose throw rugs in walkways, pet beds, electrical cords, etc?: Yes Adequate lighting in your home to reduce risk of falls?: Yes Life alert?: No Use of a cane, walker or w/c?: Yes Grab bars in the bathroom?: No Shower chair or bench in shower?: Yes Elevated toilet seat or a handicapped toilet?: Yes  TIMED UP AND GO:  Was the test performed?  No  Cognitive Function: 6CIT completed        02/07/2024   10:16 AM 01/21/2023   12:43 PM 01/17/2022   12:53 PM 01/11/2021    9:04 AM  6CIT Screen  What Year? 0 points 0 points  0 points 0 points  What month? 0 points 0 points 0 points 0 points  What time? 0 points 0 points 0 points   Count back from 20 0 points 0 points 0 points 0 points  Months in reverse 0 points 0 points 0 points 4 points  Repeat phrase 0 points 0 points 0 points 4 points  Total Score 0 points 0 points 0 points     Immunizations Immunization History  Administered Date(s) Administered   Fluad Quad(high Dose 65+) 05/14/2019, 05/26/2020, 06/08/2021, 07/16/2022   Fluad Trivalent(High Dose 65+) 05/30/2023   Influenza Split 07/02/2011, 07/17/2012   Influenza, High Dose Seasonal PF 05/29/2017   Influenza,inj,Quad PF,6+ Mos 05/13/2013, 06/15/2015, 06/19/2016, 06/30/2018   Influenza-Unspecified 06/30/2018   PFIZER Comirnaty(Gray Top)Covid-19 Tri-Sucrose Vaccine 06/14/2021    PFIZER(Purple Top)SARS-COV-2 Vaccination 09/25/2019, 10/16/2019, 06/07/2020   Pfizer(Comirnaty)Fall Seasonal Vaccine 12 years and older 07/16/2022, 07/22/2023   Pneumococcal Conjugate-13 10/10/2017   Pneumococcal Polysaccharide-23 12/20/2014, 11/11/2019   Td 02/02/2010, 09/28/2022   Zoster Recombinant(Shingrix) 06/14/2021, 08/14/2021    Screening Tests Health Maintenance  Topic Date Due   INFLUENZA VACCINE  04/03/2024   HEMOGLOBIN A1C  06/10/2024   FOOT EXAM  06/17/2024   OPHTHALMOLOGY EXAM  02/04/2025   Medicare Annual Wellness (AWV)  02/06/2025   Colonoscopy  12/31/2030   DTaP/Tdap/Td (3 - Tdap) 09/28/2032   Pneumonia Vaccine 21+ Years old  Completed   Hepatitis C Screening  Completed   Zoster Vaccines- Shingrix  Completed   HPV VACCINES  Aged Out   Meningococcal B Vaccine  Aged Out   COVID-19 Vaccine  Discontinued    Health Maintenance  There are no preventive care reminders to display for this patient. Health Maintenance Items Addressed:   Additional Screening:  Vision Screening: Recommended annual ophthalmology exams for early detection of glaucoma and other disorders of the eye. Would you like a referral to an eye doctor? No    Dental Screening: Recommended annual dental exams for proper oral hygiene  Community Resource Referral / Chronic Care Management: CRR required this visit?  No   CCM required this visit?  No   Plan:    I have personally reviewed and noted the following in the patient's chart:   Medical and social history Use of alcohol, tobacco or illicit drugs  Current medications and supplements including opioid prescriptions. Patient is not currently taking opioid prescriptions. Functional ability and status Nutritional status Physical activity Advanced directives List of other physicians Hospitalizations, surgeries, and ER visits in previous 12 months Vitals Screenings to include cognitive, depression, and falls Referrals and  appointments  In addition, I have reviewed and discussed with patient certain preventive protocols, quality metrics, and best practice recommendations. A written personalized care plan for preventive services as well as general preventive health recommendations were provided to patient.   Dewayne Ford, LPN   09/08/1094   After Visit Summary: (MyChart) Due to this being a telephonic visit, the after visit summary with patients personalized plan was offered to patient via MyChart   Notes: Nothing significant to report at this time.

## 2024-02-07 NOTE — Telephone Encounter (Signed)
 Noted

## 2024-02-07 NOTE — Telephone Encounter (Signed)
 Left message to return phone call.

## 2024-02-07 NOTE — Patient Instructions (Signed)
 Russell Austin , Thank you for taking time out of your busy schedule to complete your Annual Wellness Visit with me. I enjoyed our conversation and look forward to speaking with you again next year. I, as well as your care team,  appreciate your ongoing commitment to your health goals. Please review the following plan we discussed and let me know if I can assist you in the future. Your Game plan/ To Do List    Referrals: If you haven't heard from the office you've been referred to, please reach out to them at the phone provided.   Follow up Visits: Next Medicare AWV with our clinical staff: 02/12/25 @ 10:40a   Have you seen your provider in the last 6 months (3 months if uncontrolled diabetes)?  Next Office Visit with your provider: 03/12/24 @ 10:30a  Clinician Recommendations:  Aim for 30 minutes of exercise or brisk walking, 6-8 glasses of water , and 5 servings of fruits and vegetables each day.       This is a list of the screening recommended for you and due dates:  Health Maintenance  Topic Date Due   Flu Shot  04/03/2024   Hemoglobin A1C  06/10/2024   Complete foot exam   06/17/2024   Eye exam for diabetics  02/04/2025   Medicare Annual Wellness Visit  02/06/2025   Colon Cancer Screening  12/31/2030   DTaP/Tdap/Td vaccine (3 - Tdap) 09/28/2032   Pneumonia Vaccine  Completed   Hepatitis C Screening  Completed   Zoster (Shingles) Vaccine  Completed   HPV Vaccine  Aged Out   Meningitis B Vaccine  Aged Out   COVID-19 Vaccine  Discontinued    Advanced directives: (Copy Requested) Please bring a copy of your health care power of attorney and living will to the office to be added to your chart at your convenience. You can mail to [facility] [employer] [address] [place], [place] [address] or email to [email or web address] Advance Care Planning is important because it:  [x]  Makes sure you receive the medical care that is consistent with your values, goals, and  preferences  [x]  It provides guidance to your family and loved ones and reduces their decisional burden about whether or not they are making the right decisions based on your wishes.  Follow the link provided in your after visit summary or read over the paperwork we have mailed to you to help you started getting your Advance Directives in place. If you need assistance in completing these, please reach out to us  so that we can help you!  See attachments for Preventive Care and Fall Prevention Tips.

## 2024-02-07 NOTE — Telephone Encounter (Signed)
 Copied from CRM [id number]. Topic: General - Call Back - No Documentation >> Feb 07, 2024 10:30 AM Dewanda Foots wrote: Reason for CRM: Nou from [employer] is returning phone call to [place]. They have been missing one another back and forth in regards to this patient.   Nou would like her to know that this is going to be an order and Randel Buss would have to put in an order for Custodial care to [place] and once they get the order, they will complete the prior auth form and this would be faxed to them for review so he can receive the benefit from [employer].  This is what she wanted her to know.  If you need to, you can call her back but it is not needed unless you want to. She will be out of office after 1 PM today  [phone]

## 2024-02-07 NOTE — Telephone Encounter (Signed)
 Copied from CRM [id number]. Topic: General - Other >> Feb 07, 2024 10:04 AM Alyse July wrote: Reason for CRM: Nou from [employer] returned call to speak with Nils Baseman [phone]. Nou will be out of the office at 1:00pm. Detailed message can be left and she will respond back on Monday

## 2024-02-07 NOTE — Telephone Encounter (Signed)
Noted order faxed .

## 2024-02-07 NOTE — Telephone Encounter (Signed)
 Per other phone note Nou returned the call. I left detailed message on vm.

## 2024-02-10 ENCOUNTER — Ambulatory Visit (INDEPENDENT_AMBULATORY_CARE_PROVIDER_SITE_OTHER): Admitting: Podiatry

## 2024-02-10 DIAGNOSIS — Z91199 Patient's noncompliance with other medical treatment and regimen due to unspecified reason: Secondary | ICD-10-CM

## 2024-02-10 NOTE — Progress Notes (Signed)
 No show

## 2024-02-11 DIAGNOSIS — N186 End stage renal disease: Secondary | ICD-10-CM | POA: Diagnosis not present

## 2024-02-11 DIAGNOSIS — T8249XD Other complication of vascular dialysis catheter, subsequent encounter: Secondary | ICD-10-CM | POA: Diagnosis not present

## 2024-02-11 DIAGNOSIS — N2581 Secondary hyperparathyroidism of renal origin: Secondary | ICD-10-CM | POA: Diagnosis not present

## 2024-02-11 DIAGNOSIS — D689 Coagulation defect, unspecified: Secondary | ICD-10-CM | POA: Diagnosis not present

## 2024-02-11 DIAGNOSIS — Z992 Dependence on renal dialysis: Secondary | ICD-10-CM | POA: Diagnosis not present

## 2024-02-11 DIAGNOSIS — D631 Anemia in chronic kidney disease: Secondary | ICD-10-CM | POA: Diagnosis not present

## 2024-02-14 DIAGNOSIS — I6302 Cerebral infarction due to thrombosis of basilar artery: Secondary | ICD-10-CM | POA: Diagnosis not present

## 2024-02-14 DIAGNOSIS — G8191 Hemiplegia, unspecified affecting right dominant side: Secondary | ICD-10-CM | POA: Diagnosis not present

## 2024-02-18 ENCOUNTER — Encounter: Payer: Self-pay | Admitting: Adult Health

## 2024-02-18 ENCOUNTER — Ambulatory Visit (INDEPENDENT_AMBULATORY_CARE_PROVIDER_SITE_OTHER): Admitting: Adult Health

## 2024-02-18 VITALS — BP 124/78 | HR 87 | Temp 98.1°F | Ht 69.0 in | Wt 159.6 lb

## 2024-02-18 DIAGNOSIS — Z8673 Personal history of transient ischemic attack (TIA), and cerebral infarction without residual deficits: Secondary | ICD-10-CM | POA: Diagnosis not present

## 2024-02-18 DIAGNOSIS — N186 End stage renal disease: Secondary | ICD-10-CM | POA: Diagnosis not present

## 2024-02-18 DIAGNOSIS — Z992 Dependence on renal dialysis: Secondary | ICD-10-CM | POA: Diagnosis not present

## 2024-02-18 DIAGNOSIS — D638 Anemia in other chronic diseases classified elsewhere: Secondary | ICD-10-CM

## 2024-02-18 DIAGNOSIS — K5641 Fecal impaction: Secondary | ICD-10-CM | POA: Diagnosis not present

## 2024-02-18 DIAGNOSIS — E1140 Type 2 diabetes mellitus with diabetic neuropathy, unspecified: Secondary | ICD-10-CM | POA: Diagnosis not present

## 2024-02-18 DIAGNOSIS — E876 Hypokalemia: Secondary | ICD-10-CM

## 2024-02-18 DIAGNOSIS — Z794 Long term (current) use of insulin: Secondary | ICD-10-CM

## 2024-02-18 DIAGNOSIS — E785 Hyperlipidemia, unspecified: Secondary | ICD-10-CM

## 2024-02-18 DIAGNOSIS — I10 Essential (primary) hypertension: Secondary | ICD-10-CM

## 2024-02-18 DIAGNOSIS — N2581 Secondary hyperparathyroidism of renal origin: Secondary | ICD-10-CM | POA: Diagnosis not present

## 2024-02-18 DIAGNOSIS — D689 Coagulation defect, unspecified: Secondary | ICD-10-CM | POA: Diagnosis not present

## 2024-02-18 DIAGNOSIS — T8249XD Other complication of vascular dialysis catheter, subsequent encounter: Secondary | ICD-10-CM | POA: Diagnosis not present

## 2024-02-18 NOTE — Progress Notes (Signed)
 Subjective:    Patient ID: Russell Austin, male    DOB: 08-30-1951, 73 y.o.   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  He presents to the office today for follow up after hospital admission with his wife  Admit Date 01/25/2024 Discharge Date 01/26/2024  He presented to the emergency room with 1 day of nausea/vomiting/abdominal pain.  He had not had a bowel movement for a week.  He denied dark or bloody bowel movements prior to constipation.  In the ER his CT abdomen pelvis showed generalized stool with stool distended rectum with fat stranding, prostatitis improved from previous, chronic peritoneal collections improved from previous.  His heart rate was in the 100s, blood pressure in the 140s systolic.  He received 5 mg IV metoprolol , Reglan , Benadryl , 250 cc IV fluids.  Hospital Course  Fecal Impaction/constipation/nausea and vomiting  - CT abdomen pelvis did not show evidence of obstruction but did show generalized stool and stool distended rectum with surrounding fat stranding.  He was started on MiraLAX , enema and patient had 2 bowel movements.  His nausea/vomiting/abdominal pain resolved.  Hypokalemia /ESRD - Initial potassium 3.2 in the emergency room.  On recheck potassium was still low at 2.7 and he was given 80 mEq of potassium replacement.  Nephrology did not feel as though he needed urgent or emergent dialysis and was advised to resume dialysis on Tuesday.  Hypertension - Pressure continues to be elevated despite continuing his amlodipine  5 mg daily, metoprolol  50 mg twice daily, and  losartan  100 mg daily.  His amlodipine  was increased to 10 mg and metoprolol  was increased to 100 mg twice daily.  Losartan  was kept at 100  Hyperlipidemia  - Not currently on medication   DM Type 2  - Resumed home medications  History of CVA - Stable.  Continued on aspirin   Anemia of chronic disease - Stable  Today he reports that he is doing well overall, he is going to dialysis after this appointment.  Per the patient's wife, they plan on meeting with vein and vascular to discussed an AV fistula.  He is now having regular bowel movements and has not had any abdominal pain, nausea, or vomiting.  With the changes in his blood pressure medication he has not had any dizziness, lightheadedness, chest pain, or shortness of breath  This issue is that of low blood sugars.  Reports that over the last 2 weeks his blood sugars have been running low and his libre alarm is going off in the middle of the night.  He has not taken his insulin  in 2 to 3 weeks due to the glycemia.  He is still taking glipizide  2.5 mg in the morning.  His appetite has been good  CONTINUOUS GLUCOSE MONITORING RECORD INTERPRETATION                 Dates of Recording:    Sensor description: freestyle libre    Results statistics: last 30 days       Average  130  Time in range 76%  % Time Above 180 15%  % Time above 250 2%  % Time Below target 7%  Review of Systems  Constitutional: Negative.   HENT: Negative.    Eyes: Negative.   Respiratory: Negative.    Cardiovascular: Negative.   Gastrointestinal: Negative.   Genitourinary: Negative.   Musculoskeletal:  Positive for arthralgias, back pain and gait problem.  Skin: Negative.   Allergic/Immunologic: Negative.   Hematological: Negative.   Psychiatric/Behavioral: Negative.     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)     Chronic kidney disease    Diabetes mellitus type II    Diabetic retinopathy FOLLOWED BY DR Seward Dao   ED (erectile dysfunction)    Glaucoma of both eyes    Hypertension    Hypertensive urgency 12/27/2023   Left hydrocele    Stroke [facility])     Social History   Socioeconomic History   Marital status: Married    Spouse name: Not on file   Number of children: 1   Years of education: 12   Highest education level: 12th grade  Occupational History   Occupation: Disabled  Tobacco Use   Smoking status: Never   Smokeless tobacco: Never  Vaping Use   Vaping status: Never Used  Substance and Sexual Activity   Alcohol use: No   Drug use: No   Sexual activity: Not on file  Other Topics Concern   Not on file  Social History Narrative   Lives at home with his wife and granddaughter.   Left-handed.   3 cups caffeine per day.   Social Drivers of Corporate investment banker Strain: Low Risk  (02/07/2024)   Overall Financial Resource Strain (CARDIA)    Difficulty of Paying Living Expenses: Not hard at all  Food Insecurity: No Food Insecurity (02/07/2024)   Hunger Vital Sign    Worried About Running Out of Food in the Last Year: Never true    Ran Out of Food in the Last Year: Never true  Transportation Needs: No Transportation Needs (02/07/2024)   PRAPARE - Administrator, Civil Service (Medical): No    Lack of Transportation (Non-Medical): No  Physical Activity: Insufficiently Active (02/07/2024)   Exercise Vital Sign    Days of Exercise per Week: 2 days    Minutes of Exercise per Session: 20 min  Stress: No Stress Concern Present (02/07/2024)   [employer] of Occupational Health - Occupational Stress Questionnaire    Feeling of Stress : Not at all  Social Connections: Socially Integrated (02/07/2024)   Social Connection and Isolation Panel    Frequency of Communication with Friends and Family: More than three times a week    Frequency of Social Gatherings with Friends and  Family: More than three times a week    Attends Religious Services: More than 4 times per year    Active Member of [employer] or Organizations: Yes    Attends Banker Meetings: More than 4 times per year    Marital Status: Married  Catering manager Violence: Not At Risk (02/07/2024)   Humiliation, Afraid, Rape, and Kick questionnaire    Fear of Current or Ex-Partner: No    Emotionally Abused: No    Physically Abused: No    Sexually Abused: No    Past Surgical History:  Procedure Laterality Date   APPENDECTOMY  AGE EARLY 20'S   CAPD INSERTION N/A 04/11/2023   Procedure: LAPAROSCOPIC INSERTION CONTINUOUS AMBULATORY PERITONEAL DIALYSIS  (  CAPD) CATHETER WITH  OMENTOPEXY;  Surgeon: Carlene Che, MD;  Location: [facility] OR;  Service: Vascular;  Laterality: N/A;   CAPD REMOVAL N/A 12/30/2023   Procedure: CONTINUOUS AMBULATORY PERITONEAL DIALYSIS  (CAPD) CATHETER REMOVAL;  Surgeon: Kayla Part, MD;  Location[facility];  Service: Vascular;  Laterality: N/A;   COLONOSCOPY  12/30/2020   every 5 years   HYDROCELE EXCISION  03/31/2012   Procedure: HYDROCELECTOMY ADULT;  Surgeon: Trent Frizzle, MD;  Location: [facility];  Service: Urology;  Laterality: Left;  45 MINS     INSERTION OF DIALYSIS CATHETER N/A 12/30/2023   Procedure: EXCHANGE OF DIALYSIS CATHETER;  Surgeon: Kayla Part, MD;  Location: [facility] OR;  Service: Vascular;  Laterality: N/A;   IR FLUORO GUIDE CV LINE LEFT  12/27/2023   IR US  GUIDE VASC ACCESS LEFT  12/27/2023   LEFT EYE LASER RETINA REPAIR  SEPT 2012   RIGHT EYE VITRECTOMY/ INSERTION GLAUCOMA SETON/ LASER REPAIR  12-13-2008   RETINAL ARTERY OCCLUSION /NEOVASCULAR GLAUCOMA/ HEMORRHAGE   RIGHT EYE VITRETOMY/ INSERTION GLAUCOMA SETON X2/ LASER  03-24-2009   RECURRENT HEMORRHAGE/ OCCLUSION INTERNAL SETON   SHOULDER ARTHROSCOPY Right 2005   undescended right testicle removed  1994    Family History  Problem Relation Age of Onset   Glaucoma Mother     Diabetes Mother    Stomach cancer Maternal Grandfather    Stroke Maternal Grandmother     Allergies  Allergen Reactions   Lactose Intolerance (Gi) Diarrhea   Peach [Prunus Persica] Itching and Other (See Comments)    ITCHY THROAT   Apple Pectin [Pectin] Itching and Other (See Comments)    ITCHY THROAT   Metformin  And Related Other (See Comments)    D/t decreased kidney function    Morphine  And Codeine Anxiety    Current Outpatient Medications on File Prior to Visit  Medication Sig Dispense Refill   amLODipine  (NORVASC ) 10 MG tablet Take 1 tablet (10 mg total) by mouth daily. 30 tablet 0   aspirin  EC 81 MG tablet Take 81 mg by mouth daily.     buPROPion  (WELLBUTRIN  XL) 300 MG 24 hr tablet TAKE 1 TABLET (300 MG TOTAL) BY MOUTH EVERY MORNING. 90 tablet 1   losartan  (COZAAR ) 100 MG tablet TAKE 1 TABLET BY MOUTH EVERY DAY IN THE MORNING 90 tablet 1   acetaminophen  (TYLENOL ) 325 MG tablet Take 2 tablets (650 mg total) by mouth every 6 (six) hours as needed for mild pain (pain score 1-3) (or Fever >/= 101). (Patient not taking: Reported on 02/18/2024) 20 tablet 0   albuterol  (PROVENTIL ) (2.5 MG/3ML) 0.083% nebulizer solution Take 3 mLs (2.5 mg total) by nebulization every 6 (six) hours as needed for wheezing or shortness of breath. 75 mL 12   [Paused] atorvastatin  (LIPITOR) 10 MG tablet Take 1 tablet (10 mg total) by mouth every morning. 90 tablet 3   calcitRIOL  (ROCALTROL ) 0.5 MCG capsule Take 1 capsule (0.5 mcg total) by mouth daily.     chlorproMAZINE  (THORAZINE ) 25 MG tablet TAKE 1 TABLET BY MOUTH THREE TIMES A DAY (Patient taking differently: Take 25 mg by mouth daily as needed for hiccoughs.) 270 tablet 1   Continuous Glucose Sensor (FREESTYLE LIBRE 3 PLUS SENSOR) MISC Change sensor every 15 days. (Patient taking differently: Inject 1 Device into the skin every 14 (fourteen) days.) 6 each 3   gabapentin  (NEURONTIN ) 300 MG capsule TAKE ONE CAPSULE BY MOUTH EVERYDAY AT BEDTIME (Patient  taking differently: Take 300  mg by mouth at bedtime.) 90 capsule 3   gentamicin  cream (GARAMYCIN ) 0.1 % Apply 1 Application topically daily.     insulin  aspart (NOVOLOG  FLEXPEN) 100 UNIT/ML FlexPen Inject 5 Units into the skin 3 (three) times daily with meals. (Patient taking differently: Inject 5 Units into the skin daily.) 15 mL 1   insulin  glargine (LANTUS  SOLOSTAR) 100 UNIT/ML Solostar Pen INJECT 20 UNITS INTO THE SKIN DAILY (Patient taking differently: Inject 15 Units into the skin every evening.) 15 mL 0   lactulose (CHRONULAC) 10 GM/15ML solution Take by mouth daily as needed for mild constipation or moderate constipation.     metoCLOPramide  (REGLAN ) 10 MG tablet TAKE 1 TABLET BY MOUTH 4 TIMES DAILY. (Patient taking differently: Take 10 mg by mouth daily as needed for nausea or vomiting.) 120 tablet 0   metoprolol  tartrate (LOPRESSOR ) 100 MG tablet Take 1 tablet (100 mg total) by mouth 2 (two) times daily. 60 tablet 0   pantoprazole  (PROTONIX ) 40 MG tablet Take 1 tablet (40 mg total) by mouth daily. (Patient not taking: Reported on 02/18/2024) 90 tablet 0   potassium chloride  SA (KLOR-CON  M) 20 MEQ tablet Take 1 tablet (20 mEq total) by mouth daily. 30 tablet 3   promethazine  (PHENERGAN ) 12.5 MG tablet Take 1 tablet (12.5 mg total) by mouth every 8 (eight) hours as needed for nausea or vomiting. 20 tablet 2   sucralfate  (CARAFATE ) 1 GM/10ML suspension Take 10 mLs (1 g total) by mouth 4 (four) times daily -  with meals and at bedtime. 420 mL 0   No current facility-administered medications on file prior to visit.    BP 124/78 (BP Location: Left Arm, Patient Position: Sitting, Cuff Size: Normal)   Pulse 87   Temp 98.1 F (36.7 C) (Oral)   Ht 5' 9 (1.753 m)   Wt 159 lb 9.6 oz (72.4 kg)   BMI 23.57 kg/m       Objective:   Physical Exam Vitals and nursing note reviewed.  Constitutional:      Appearance: Normal appearance.   Cardiovascular:     Rate and Rhythm: Normal rate and  regular rhythm.     Pulses: Normal pulses.     Heart sounds: Normal heart sounds.  Pulmonary:     Effort: Pulmonary effort is normal.     Breath sounds: Normal breath sounds.   Musculoskeletal:        General: Normal range of motion.   Skin:    General: Skin is warm and dry.   Neurological:     General: No focal deficit present.     Mental Status: He is alert and oriented to person, place, and time.   Psychiatric:        Mood and Affect: Mood normal.        Behavior: Behavior normal.        Thought Content: Thought content normal.        Judgment: Judgment normal.       Assessment & Plan:  1. Fecal impaction in rectum [facility]) (Primary) - Reviewed hospital notes, discharge instructions, labs, imaging, and medication changes with the patient and the patient's wife.  All questions answered to the best of my ability - Resolved  2. Hypokalemia - Will have blood work done today at Dialysis   3. ESRD (end stage renal disease) (HCC) - T/Th/Sat for dialsysi   4. Essential hypertension - Controlled with changes from hospital admission. No change   5. Hyperlipidemia, unspecified hyperlipidemia type -  Continue with home regimen   6. Type 2 diabetes mellitus with diabetic neuropathy, with long-term current use of insulin  (HCC) -Will continue him on glipizide  but have him change it to evening dosing to see if this helps with hypoglycemic episodes  7. History of CVA (cerebrovascular accident) - Stable - Continue with ASA  8. Anemia of chronic disease - Stable    Alto Atta, NP

## 2024-02-19 ENCOUNTER — Ambulatory Visit (INDEPENDENT_AMBULATORY_CARE_PROVIDER_SITE_OTHER): Admitting: Podiatry

## 2024-02-19 DIAGNOSIS — E1140 Type 2 diabetes mellitus with diabetic neuropathy, unspecified: Secondary | ICD-10-CM | POA: Diagnosis not present

## 2024-02-19 DIAGNOSIS — M79675 Pain in left toe(s): Secondary | ICD-10-CM | POA: Diagnosis not present

## 2024-02-19 DIAGNOSIS — Z794 Long term (current) use of insulin: Secondary | ICD-10-CM

## 2024-02-19 DIAGNOSIS — B351 Tinea unguium: Secondary | ICD-10-CM | POA: Diagnosis not present

## 2024-02-19 DIAGNOSIS — M79674 Pain in right toe(s): Secondary | ICD-10-CM

## 2024-02-19 DIAGNOSIS — L84 Corns and callosities: Secondary | ICD-10-CM | POA: Diagnosis not present

## 2024-02-19 NOTE — Progress Notes (Signed)
 Subjective:  Patient ID: Russell Austin, male    DOB: November 22, 1950,   MRN: [id number]  Chief Complaint  Patient presents with   [facility]     RM#7 [facility]  blood sugar under control for the most part no other concerns today.    73 y.o. male presents for concern of thickened elongated and painful nails that are difficult to trim. Requesting to have them trimmed today. Relates burning and tingling in their feet. Patient is diabetic and last A1c was  Lab Results  Component Value Date   HGBA1C 6.8 (A) 12/10/2023   .  Patient is due for new diabetic shoes and requesting new pair today. Also relates has seen PT and they were recommended a KAFO for patient to aid with ambulation.   PCP:  Alto Atta, NP    . Denies any other pedal complaints. Denies n/v/f/c.   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 CFT <3 seconds. Absent hair growth on digits. Edema noted to bilateral lower extremities. Xerosis noted bilaterally.  Skin. No lacerations or abrasions bilateral feet. Nails 1-5 bilateral  are thickened discolored and elongated with subungual debris. Hyperkeratotic lesion noted plantar left heel and posterior right heel.  Musculoskeletal: MMT 4/5 bilateral lower extremities in DF, PF, Inversion and Eversion. Deceased ROM in DF of ankle joint. Mild HAV deformity bilateral and hammered digits 2-5 bilateral.  Neurological: Sensation intact to light touch. Protective sensation diminished bilateral.    ABI Findings:   +---------+------------------+-----+--------+--------+  Right   Rt Pressure (mmHg)IndexWaveformComment   +---------+------------------+-----+--------+--------+  Brachial 109                                      +---------+------------------+-----+--------+--------+  PTA     181               1.57                   +---------+------------------+-----+--------+--------+  DP      171               1.49                   +---------+------------------+-----+--------+--------+  Great Toe165               1.43                   +---------+------------------+-----+--------+--------+   +---------+------------------+-----+--------+-------+  Left    Lt Pressure (mmHg)IndexWaveformComment  +---------+------------------+-----+--------+-------+  Brachial 115                                     +---------+------------------+-----+--------+-------+  PTA     163               1.42                  +---------+------------------+-----+--------+-------+  DP      180               1.57                  +---------+------------------+-----+--------+-------+  Great Toe164               1.43                  +---------+------------------+-----+--------+-------+       TOES Findings:  +----------+---------------+--------+-------+  Right ToesPressure (mmHg)WaveformComment  +----------+---------------+--------+-------+  1st Digit                Normal           +----------+---------------+--------+-------+  2nd Digit                Normal           +----------+---------------+--------+-------+  3rd Digit                Normal           +----------+---------------+--------+-------+  4th Digit                Normal           +----------+---------------+--------+-------+  5th Digit                Normal           +----------+---------------+--------+-------+      +---------+---------------+--------+-------+   Left ToesPressure (mmHg)WaveformComment  +---------+---------------+--------+-------+  1st Digit               Normal           +---------+---------------+--------+-------+  2nd Digit               Normal           +---------+---------------+--------+-------+  3rd Digit               Normal           +---------+---------------+--------+-------+  4th Digit               Normal           +---------+---------------+--------+-------+  5th Digit               Normal           +---------+---------------+--------+-------+           Summary:  Right: Resting right ankle-brachial index indicates noncompressible right  lower extremity arteries. The right toe-brachial index is normal.   Left: Resting left ankle-brachial index indicates noncompressible left  lower extremity arteries. The left toe-brachial index is normal.   Assessment:   1. Pain due to onychomycosis of toenails of both feet   2. Type 2 diabetes mellitus with diabetic neuropathy, with long-term current use of insulin  (HCC)        Plan:  Patient was evaluated and treated and all questions answered. -Discussed and educated patient on diabetic foot care, especially with  regards to the vascular, neurological and musculoskeletal systems.  -Stressed the importance of good glycemic control and the detriment of not  controlling glucose levels in relation to the foot. -ABIS reviewed with normal TBI and non compressible ABI.  -Discussed supportive shoes at all times and checking feet regularly.  -Mechanically debrided all nails 1-5 bilateral using sterile nail nipper and filed with dremel without incident  -Hyperkeratotc area debrided with chisel without incident x 2 to plantar heels.  -Answered all patient questions -Patient to return  in 3 months for at risk foot care -Patient advised to call the office if any problems or questions arise in the meantime.   Jennefer Moats, DPM

## 2024-02-24 ENCOUNTER — Ambulatory Visit (HOSPITAL_COMMUNITY)
Admission: RE | Admit: 2024-02-24 | Discharge: 2024-02-24 | Disposition: A | Discharge status: Home / Self Care | Source: Ambulatory Visit | Attending: Surgery | Admitting: Surgery

## 2024-02-24 ENCOUNTER — Encounter

## 2024-02-24 ENCOUNTER — Ambulatory Visit (HOSPITAL_BASED_OUTPATIENT_CLINIC_OR_DEPARTMENT_OTHER)
Admission: RE | Admit: 2024-02-24 | Discharge: 2024-02-24 | Disposition: A | Discharge status: Home / Self Care | Source: Ambulatory Visit | Attending: Surgery | Admitting: Surgery

## 2024-02-24 DIAGNOSIS — N185 Chronic kidney disease, stage 5: Secondary | ICD-10-CM

## 2024-02-25 ENCOUNTER — Telehealth: Payer: Self-pay

## 2024-02-25 DIAGNOSIS — N2581 Secondary hyperparathyroidism of renal origin: Secondary | ICD-10-CM | POA: Diagnosis not present

## 2024-02-25 DIAGNOSIS — D689 Coagulation defect, unspecified: Secondary | ICD-10-CM | POA: Diagnosis not present

## 2024-02-25 DIAGNOSIS — D631 Anemia in chronic kidney disease: Secondary | ICD-10-CM | POA: Diagnosis not present

## 2024-02-25 DIAGNOSIS — E875 Hyperkalemia: Secondary | ICD-10-CM | POA: Diagnosis not present

## 2024-02-25 DIAGNOSIS — N186 End stage renal disease: Secondary | ICD-10-CM | POA: Diagnosis not present

## 2024-02-25 DIAGNOSIS — T8249XD Other complication of vascular dialysis catheter, subsequent encounter: Secondary | ICD-10-CM | POA: Diagnosis not present

## 2024-02-25 DIAGNOSIS — Z992 Dependence on renal dialysis: Secondary | ICD-10-CM | POA: Diagnosis not present

## 2024-02-25 NOTE — Telephone Encounter (Signed)
 Shoe returned from [employer] with lift added patients wife will be in shortly to PU  [employer], CFo, CFm

## 2024-02-26 DIAGNOSIS — Z860100 Personal history of colon polyps, unspecified: Secondary | ICD-10-CM | POA: Diagnosis not present

## 2024-02-27 ENCOUNTER — Telehealth: Payer: Self-pay | Admitting: Podiatry

## 2024-02-27 NOTE — Telephone Encounter (Signed)
 Pt LVM- shoes are too small

## 2024-03-02 ENCOUNTER — Ambulatory Visit: Attending: Surgery | Admitting: Physician Assistant

## 2024-03-02 ENCOUNTER — Other Ambulatory Visit: Payer: Self-pay

## 2024-03-02 VITALS — BP 144/83 | HR 102 | Temp 98.2°F | Ht 69.0 in | Wt 156.5 lb

## 2024-03-02 DIAGNOSIS — N186 End stage renal disease: Secondary | ICD-10-CM | POA: Diagnosis not present

## 2024-03-02 DIAGNOSIS — Z992 Dependence on renal dialysis: Secondary | ICD-10-CM | POA: Diagnosis not present

## 2024-03-02 DIAGNOSIS — E1122 Type 2 diabetes mellitus with diabetic chronic kidney disease: Secondary | ICD-10-CM | POA: Diagnosis not present

## 2024-03-02 NOTE — Progress Notes (Addendum)
 Office Note     CC:  follow up Requesting Provider:  Merna Huxley, NP  HPI: Russell Austin is a 73 y.o. (04-11-1951) male who presents status post removal of infected PD catheter and placement of left IJ TDC.  He has not had any prior HD access.  He is left arm dominant.  He has had a stroke in the past which affects his right arm.  He does not take any blood thinners.  He does not have a pacemaker.  If at all possible, he and his wife would prefer access placement in the right arm.  TDC has been functioning appropriately.  He is dialyzing on a  Tuesday, Thursday, Saturday schedule.   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)    Chronic kidney disease    Diabetes mellitus type II    Diabetic retinopathy FOLLOWED BY DR ELNER   ED (erectile dysfunction)    Glaucoma of both eyes    Hypertension    Hypertensive urgency 12/27/2023   Left hydrocele    Stroke [facility])     Past Surgical History:  Procedure Laterality Date   APPENDECTOMY  AGE EARLY 20'S   CAPD INSERTION N/A 04/11/2023   Procedure: LAPAROSCOPIC INSERTION CONTINUOUS AMBULATORY PERITONEAL DIALYSIS  (CAPD) CATHETER WITH  OMENTOPEXY;  Surgeon: Magda Debby SAILOR, MD;  Location: [facility] OR;  Service: Vascular;  Laterality: N/A;   CAPD REMOVAL N/A 12/30/2023   Procedure: CONTINUOUS AMBULATORY PERITONEAL DIALYSIS  (CAPD) CATHETER REMOVAL;  Surgeon: Lanis Fonda BRAVO, MD;  Location: [facility] OR;  Service: Vascular;  Laterality: N/A;   COLONOSCOPY  12/30/2020   every 5 years   HYDROCELE EXCISION  03/31/2012   Procedure: HYDROCELECTOMY ADULT;  Surgeon: Garnette Shack, MD;  Location: [facility];  Service: Urology;  Laterality: Left;  45 MINS     INSERTION OF DIALYSIS CATHETER N/A 12/30/2023   Procedure: EXCHANGE OF DIALYSIS CATHETER;  Surgeon: Lanis Fonda BRAVO, MD;  Location: [facility] OR;  Service:  Vascular;  Laterality: N/A;   IR FLUORO GUIDE CV LINE LEFT  12/27/2023   IR US  GUIDE VASC ACCESS LEFT  12/27/2023   LEFT EYE LASER RETINA REPAIR  SEPT 2012   RIGHT EYE VITRECTOMY/ INSERTION GLAUCOMA SETON/ LASER REPAIR  12-13-2008   RETINAL ARTERY OCCLUSION /NEOVASCULAR GLAUCOMA/ HEMORRHAGE   RIGHT EYE VITRETOMY/ INSERTION GLAUCOMA SETON X2/ LASER  03-24-2009   RECURRENT HEMORRHAGE/ OCCLUSION INTERNAL SETON   SHOULDER ARTHROSCOPY Right 2005   undescended right testicle removed  1994    Social History   Socioeconomic History   Marital status: Married    Spouse name: Not on file   Number of children: 1   Years of education: 12   Highest education level: 12th grade  Occupational History   Occupation: Disabled  Tobacco Use   Smoking status: Never   Smokeless tobacco: Never  Vaping Use   Vaping status: Never Used  Substance and Sexual Activity   Alcohol use: No   Drug use: No   Sexual activity: Not on file  Other Topics Concern   Not on file  Social History Narrative   Lives at home with his wife and granddaughter.   Left-handed.   3 cups caffeine per day.   Social Drivers of Corporate investment banker Strain: Low Risk  (02/07/2024)   Overall Financial Resource Strain (CARDIA)  Difficulty of Paying Living Expenses: Not hard at all  Food Insecurity: No Food Insecurity (02/07/2024)   Hunger Vital Sign    Worried About Running Out of Food in the Last Year: Never true    Ran Out of Food in the Last Year: Never true  Transportation Needs: No Transportation Needs (02/07/2024)   PRAPARE - Administrator, Civil Service (Medical): No    Lack of Transportation (Non-Medical): No  Physical Activity: Insufficiently Active (02/07/2024)   Exercise Vital Sign    Days of Exercise per Week: 2 days    Minutes of Exercise per Session: 20 min  Stress: No Stress Concern Present (02/07/2024)   [employer] of Occupational Health - Occupational Stress Questionnaire    Feeling of  Stress : Not at all  Social Connections: Socially Integrated (02/07/2024)   Social Connection and Isolation Panel    Frequency of Communication with Friends and Family: More than three times a week    Frequency of Social Gatherings with Friends and Family: More than three times a week    Attends Religious Services: More than 4 times per year    Active Member of [employer] or Organizations: Yes    Attends Engineer, structural: More than 4 times per year    Marital Status: Married  Catering manager Violence: Not At Risk (02/07/2024)   Humiliation, Afraid, Rape, and Kick questionnaire    Fear of Current or Ex-Partner: No    Emotionally Abused: No    Physically Abused: No    Sexually Abused: No    Family History  Problem Relation Age of Onset   Glaucoma Mother    Diabetes Mother    Stomach cancer Maternal Grandfather    Stroke Maternal Grandmother     Current Outpatient Medications  Medication Sig Dispense Refill   albuterol  (PROVENTIL ) (2.5 MG/3ML) 0.083% nebulizer solution Take 3 mLs (2.5 mg total) by nebulization every 6 (six) hours as needed for wheezing or shortness of breath. 75 mL 12   amLODipine  (NORVASC ) 10 MG tablet Take 1 tablet (10 mg total) by mouth daily. 30 tablet 0   aspirin  EC 81 MG tablet Take 81 mg by mouth daily.     [Paused] atorvastatin  (LIPITOR) 10 MG tablet Take 1 tablet (10 mg total) by mouth every morning. 90 tablet 3   buPROPion  (WELLBUTRIN  XL) 300 MG 24 hr tablet TAKE 1 TABLET (300 MG TOTAL) BY MOUTH EVERY MORNING. 90 tablet 1   calcitRIOL  (ROCALTROL ) 0.5 MCG capsule Take 1 capsule (0.5 mcg total) by mouth daily.     chlorproMAZINE  (THORAZINE ) 25 MG tablet TAKE 1 TABLET BY MOUTH THREE TIMES A DAY (Patient taking differently: Take 25 mg by mouth daily as needed for hiccoughs.) 270 tablet 1   Continuous Glucose Sensor (FREESTYLE LIBRE 3 PLUS SENSOR) MISC Change sensor every 15 days. (Patient taking differently: Inject 1 Device into the skin every 14 (fourteen)  days.) 6 each 3   gabapentin  (NEURONTIN ) 300 MG capsule TAKE ONE CAPSULE BY MOUTH EVERYDAY AT BEDTIME (Patient taking differently: Take 300 mg by mouth at bedtime.) 90 capsule 3   gentamicin  cream (GARAMYCIN ) 0.1 % Apply 1 Application topically daily.     glipiZIDE  (GLUCOTROL  XL) 2.5 MG 24 hr tablet Take 2.5 mg by mouth at bedtime.     lactulose (CHRONULAC) 10 GM/15ML solution Take by mouth daily as needed for mild constipation or moderate constipation.     losartan  (COZAAR ) 100 MG tablet TAKE 1 TABLET BY MOUTH  EVERY DAY IN THE MORNING 90 tablet 1   metoCLOPramide  (REGLAN ) 10 MG tablet TAKE 1 TABLET BY MOUTH 4 TIMES DAILY. (Patient taking differently: Take 10 mg by mouth daily as needed for nausea or vomiting.) 120 tablet 0   metoprolol  tartrate (LOPRESSOR ) 100 MG tablet Take 1 tablet (100 mg total) by mouth 2 (two) times daily. 60 tablet 0   pantoprazole  (PROTONIX ) 40 MG tablet Take 1 tablet (40 mg total) by mouth daily. 90 tablet 0   potassium chloride  SA (KLOR-CON  M) 20 MEQ tablet Take 1 tablet (20 mEq total) by mouth daily. 30 tablet 3   promethazine  (PHENERGAN ) 12.5 MG tablet Take 1 tablet (12.5 mg total) by mouth every 8 (eight) hours as needed for nausea or vomiting. 20 tablet 2   sucralfate  (CARAFATE ) 1 GM/10ML suspension Take 10 mLs (1 g total) by mouth 4 (four) times daily -  with meals and at bedtime. 420 mL 0   No current facility-administered medications for this visit.    Allergies  Allergen Reactions   Lactose Intolerance (Gi) Diarrhea   Peach [Prunus Persica] Itching and Other (See Comments)    ITCHY THROAT   Apple Pectin [Pectin] Itching and Other (See Comments)    ITCHY THROAT   Metformin  And Related Other (See Comments)    D/t decreased kidney function    Morphine  And Codeine Anxiety     REVIEW OF SYSTEMS:   [X]  denotes positive finding, [ ]  denotes negative finding Cardiac  Comments:  Chest pain or chest pressure:    Shortness of breath upon exertion:    Short  of breath when lying flat:    Irregular heart rhythm:        Vascular    Pain in calf, thigh, or hip brought on by ambulation:    Pain in feet at night that wakes you up from your sleep:     Blood clot in your veins:    Leg swelling:         Pulmonary    Oxygen at home:    Productive cough:     Wheezing:         Neurologic    Sudden weakness in arms or legs:     Sudden numbness in arms or legs:     Sudden onset of difficulty speaking or slurred speech:    Temporary loss of vision in one eye:     Problems with dizziness:         Gastrointestinal    Blood in stool:     Vomited blood:         Genitourinary    Burning when urinating:     Blood in urine:        Psychiatric    Major depression:         Hematologic    Bleeding problems:    Problems with blood clotting too easily:        Skin    Rashes or ulcers:        Constitutional    Fever or chills:      PHYSICAL EXAMINATION:  Vitals:   03/02/24 1033  BP: (!) 144/83  Pulse: (!) 102  Temp: 98.2 F (36.8 C)  TempSrc: Temporal  SpO2: 98%  Weight: 156 lb 8 oz (71 kg)  Height: 5' 9 (1.753 m)    General:  WDWN in NAD; vital signs documented above Gait: Not observed HENT: WNL, normocephalic Pulmonary: normal non-labored breathing Cardiac: regular HR Abdomen: soft,  NT, no masses; incisions healed Skin: without rashes Vascular Exam/Pulses: Symmetrical radial pulses Extremities: without ischemic changes, without Gangrene , without cellulitis; without open wounds;  Musculoskeletal: no muscle wasting or atrophy  Neurologic: A&O X 3 Psychiatric:  The pt has Normal affect.   Non-Invasive Vascular Imaging:   Fistula preop imaging: Arterial duplex without hemodynamically significant stenosis.  Brachial artery bifurcates near the [facility] fossa and the lower arm  Adequate basilic vein for conduit use bilateral upper extremities    ASSESSMENT/PLAN:: 73 y.o. male here to discuss hemodialysis access  Mr.  Murphy is a 73 year old male who recently had an infected PD catheter removed.  He had a left IJ TDC placed at the same time.  He is dialyzing via [facility] without issue.  He is here to discuss permanent access.  Preop imaging demonstrates adequate basilic vein for conduit use in both arms.  He had a prior stroke affecting his right arm.  The patient and his wife would like access placement in the right arm if at all possible as he is left arm dominant.  Positioning will be difficult for surgery especially with eventual second stage basilic vein transposition.  He will be evaluated preoperatively on day of surgery to see if he would be best served with basilic vein fistula creation or AV graft placement.  Dialysis access surgery was discussed in detail with the patient and his wife including risks and they are agreeable to proceed.   Donnice Sender, PA-C [facility] [phone]  Clinic MD:   [place]

## 2024-03-03 DIAGNOSIS — D689 Coagulation defect, unspecified: Secondary | ICD-10-CM | POA: Diagnosis not present

## 2024-03-03 DIAGNOSIS — E875 Hyperkalemia: Secondary | ICD-10-CM | POA: Diagnosis not present

## 2024-03-03 DIAGNOSIS — T8249XD Other complication of vascular dialysis catheter, subsequent encounter: Secondary | ICD-10-CM | POA: Diagnosis not present

## 2024-03-03 DIAGNOSIS — Z992 Dependence on renal dialysis: Secondary | ICD-10-CM | POA: Diagnosis not present

## 2024-03-03 DIAGNOSIS — N186 End stage renal disease: Secondary | ICD-10-CM | POA: Diagnosis not present

## 2024-03-03 DIAGNOSIS — N2581 Secondary hyperparathyroidism of renal origin: Secondary | ICD-10-CM | POA: Diagnosis not present

## 2024-03-04 ENCOUNTER — Other Ambulatory Visit: Payer: Self-pay

## 2024-03-04 ENCOUNTER — Encounter (HOSPITAL_COMMUNITY): Payer: Self-pay | Admitting: Vascular Surgery

## 2024-03-04 NOTE — Progress Notes (Signed)
 SDW CALL  Patient was given pre-op  instructions over the phone. The opportunity was given for the patient to ask questions. No further questions asked. Patient verbalized understanding of instructions given.   PCP - Merna Huxley, NP Cardiologist - denies  PPM/ICD - denies Device Orders -  Rep Notified -   Chest x-ray - na EKG - 01/25/24 Stress Test - denies ECHO - 02/11/15 Cardiac Cath - denies  Sleep Study - denies CPAP -   Fasting Blood Sugar - 130's Checks Blood Sugar -has Freestyle Libre  Do not take bedtime dose of glipizide (GlucotrolXL) the night before surgery.  Check your blood sugar the morning of your surgery when you wake up and every 2 hours until you get to the Short Stay unit.  If your blood sugar is less than 70 mg/dL, you will need to treat for low blood sugar: Do not take insulin . Treat a low blood sugar (less than 70 mg/dL) with  cup of clear juice (cranberry or apple), 4 glucose tablets, OR glucose gel. Recheck blood sugar in 15 minutes after treatment (to make sure it is greater than 70 mg/dL). If your blood sugar is not greater than 70 mg/dL on recheck, call [phone] for further instructions. Report your blood sugar to the short stay nurse when you get to Short Stay.  Blood Thinner Instructions:na Aspirin  Instructions:continue  ERAS Protcol -no PRE-SURGERY Ensure or G2-   COVID TEST- na   Anesthesia review:no   Patient's wife denies that patient has shortness of breath, fever, cough and chest pain over the phone call     Special instructions:    Oral Hygiene is also important to reduce your risk of infection.  Remember - BRUSH YOUR TEETH THE MORNING OF SURGERY WITH YOUR REGULAR TOOTHPASTE

## 2024-03-09 ENCOUNTER — Other Ambulatory Visit (HOSPITAL_COMMUNITY): Payer: Self-pay

## 2024-03-09 ENCOUNTER — Encounter (HOSPITAL_COMMUNITY)
Admission: RE | Disposition: A | Discharge status: Home / Self Care | Payer: Self-pay | Source: Home / Self Care | Attending: Vascular Surgery

## 2024-03-09 ENCOUNTER — Encounter (HOSPITAL_COMMUNITY): Payer: Self-pay | Admitting: Vascular Surgery

## 2024-03-09 ENCOUNTER — Ambulatory Visit (HOSPITAL_COMMUNITY): Payer: Self-pay

## 2024-03-09 ENCOUNTER — Other Ambulatory Visit: Payer: Self-pay

## 2024-03-09 ENCOUNTER — Ambulatory Visit (HOSPITAL_COMMUNITY)
Admission: RE | Admit: 2024-03-09 | Discharge: 2024-03-09 | Disposition: A | Discharge status: Home / Self Care | Attending: Vascular Surgery | Admitting: Vascular Surgery

## 2024-03-09 ENCOUNTER — Encounter (HOSPITAL_COMMUNITY): Payer: Self-pay

## 2024-03-09 DIAGNOSIS — E1122 Type 2 diabetes mellitus with diabetic chronic kidney disease: Secondary | ICD-10-CM

## 2024-03-09 DIAGNOSIS — Z992 Dependence on renal dialysis: Secondary | ICD-10-CM | POA: Diagnosis not present

## 2024-03-09 DIAGNOSIS — Z794 Long term (current) use of insulin: Secondary | ICD-10-CM | POA: Diagnosis not present

## 2024-03-09 DIAGNOSIS — I6930 Unspecified sequelae of cerebral infarction: Secondary | ICD-10-CM | POA: Diagnosis not present

## 2024-03-09 DIAGNOSIS — H548 Legal blindness, as defined in USA: Secondary | ICD-10-CM | POA: Diagnosis not present

## 2024-03-09 DIAGNOSIS — I120 Hypertensive chronic kidney disease with stage 5 chronic kidney disease or end stage renal disease: Secondary | ICD-10-CM

## 2024-03-09 DIAGNOSIS — N186 End stage renal disease: Secondary | ICD-10-CM

## 2024-03-09 HISTORY — PX: AV FISTULA PLACEMENT: SHX1204

## 2024-03-09 LAB — GLUCOSE, CAPILLARY
Glucose-Capillary: 132 mg/dL — ABNORMAL HIGH (ref 70–99)
Glucose-Capillary: 122 mg/dL — ABNORMAL HIGH (ref 70–99)

## 2024-03-09 LAB — POCT I-STAT, CHEM 8
BUN: 39 mg/dL — ABNORMAL HIGH (ref 8–23)
Calcium, Ion: 1.1 mmol/L — ABNORMAL LOW (ref 1.15–1.40)
Chloride: 103 mmol/L (ref 98–111)
Creatinine, Ser: 3.5 mg/dL — ABNORMAL HIGH (ref 0.61–1.24)
Glucose, Bld: 124 mg/dL — ABNORMAL HIGH (ref 70–99)
HCT: 38 % — ABNORMAL LOW (ref 39.0–52.0)
Hemoglobin: 12.9 g/dL — ABNORMAL LOW (ref 13.0–17.0)
Potassium: 4.9 mmol/L (ref 3.5–5.1)
Sodium: 139 mmol/L (ref 135–145)
TCO2: 27 mmol/L (ref 22–32)

## 2024-03-09 SURGERY — ARTERIOVENOUS (AV) FISTULA CREATION
Anesthesia: Monitor Anesthesia Care | Site: Arm Upper | Laterality: Right

## 2024-03-09 MED ORDER — ORAL CARE MOUTH RINSE
15.0000 mL | Freq: Once | OROMUCOSAL | Status: AC
Start: 2024-03-09 — End: 2024-03-09

## 2024-03-09 MED ORDER — SODIUM CHLORIDE 0.9% FLUSH: 3.0000 mL | INTRAVENOUS | Status: DC | PRN

## 2024-03-09 MED ORDER — PHENYLEPHRINE 80 MCG/ML (10ML) SYRINGE FOR IV PUSH (FOR BLOOD PRESSURE SUPPORT)
PREFILLED_SYRINGE | INTRAVENOUS | Status: AC
Start: 2024-03-09 — End: 2024-03-09
  Filled 2024-03-09: qty 10

## 2024-03-09 MED ORDER — LIDOCAINE 2% (20 MG/ML) 5 ML SYRINGE
INTRAMUSCULAR | Status: DC | PRN
  Administered 2024-03-09: 100 mg via INTRAVENOUS

## 2024-03-09 MED ORDER — PHENYLEPHRINE 80 MCG/ML (10ML) SYRINGE FOR IV PUSH (FOR BLOOD PRESSURE SUPPORT)
PREFILLED_SYRINGE | INTRAVENOUS | Status: DC | PRN
  Administered 2024-03-09: 120 ug via INTRAVENOUS
  Administered 2024-03-09: 80 ug via INTRAVENOUS
  Administered 2024-03-09: 160 ug via INTRAVENOUS
  Administered 2024-03-09: 40 ug via INTRAVENOUS
  Administered 2024-03-09: 80 ug via INTRAVENOUS
  Administered 2024-03-09: 120 ug via INTRAVENOUS
  Administered 2024-03-09: 40 ug via INTRAVENOUS

## 2024-03-09 MED ORDER — HEPARIN SODIUM (PORCINE) 1000 UNIT/ML IJ SOLN
INTRAMUSCULAR | Status: AC
  Filled 2024-03-09: qty 10

## 2024-03-09 MED ORDER — DEXAMETHASONE SODIUM PHOSPHATE 10 MG/ML IJ SOLN
INTRAMUSCULAR | Status: AC
  Filled 2024-03-09: qty 1

## 2024-03-09 MED ORDER — INSULIN ASPART 100 UNIT/ML IJ SOLN: 0.0000 [IU] | INTRAMUSCULAR | Status: DC | PRN

## 2024-03-09 MED ORDER — PHENYLEPHRINE 80 MCG/ML (10ML) SYRINGE FOR IV PUSH (FOR BLOOD PRESSURE SUPPORT)
PREFILLED_SYRINGE | INTRAVENOUS | Status: AC
  Filled 2024-03-09: qty 10

## 2024-03-09 MED ORDER — PROPOFOL 10 MG/ML IV BOLUS
INTRAVENOUS | Status: DC | PRN
  Administered 2024-03-09: 150 mg via INTRAVENOUS

## 2024-03-09 MED ORDER — MIDAZOLAM HCL 2 MG/2ML IJ SOLN
INTRAMUSCULAR | Status: DC | PRN
  Administered 2024-03-09: 1 mg via INTRAVENOUS

## 2024-03-09 MED ORDER — ONDANSETRON HCL 4 MG/2ML IJ SOLN
INTRAMUSCULAR | Status: AC
  Filled 2024-03-09: qty 2

## 2024-03-09 MED ORDER — CEFAZOLIN SODIUM-DEXTROSE 2-4 GM/100ML-% IV SOLN
2.0000 g | INTRAVENOUS | Status: AC
  Administered 2024-03-09: 2 g via INTRAVENOUS
  Filled 2024-03-09: qty 100

## 2024-03-09 MED ORDER — LIDOCAINE 2% (20 MG/ML) 5 ML SYRINGE
INTRAMUSCULAR | Status: AC
  Filled 2024-03-09: qty 5

## 2024-03-09 MED ORDER — ONDANSETRON HCL 4 MG/2ML IJ SOLN: 4.0000 mg | Freq: Once | INTRAMUSCULAR | Status: DC | PRN

## 2024-03-09 MED ORDER — MIDAZOLAM HCL 2 MG/2ML IJ SOLN
INTRAMUSCULAR | Status: AC
  Filled 2024-03-09: qty 2

## 2024-03-09 MED ORDER — FENTANYL CITRATE (PF) 250 MCG/5ML IJ SOLN
INTRAMUSCULAR | Status: DC | PRN
  Administered 2024-03-09 (×2): 50 ug via INTRAVENOUS

## 2024-03-09 MED ORDER — CHLORHEXIDINE GLUCONATE 4 % EX SOLN: 60.0000 mL | Freq: Once | CUTANEOUS | Status: DC

## 2024-03-09 MED ORDER — CHLORHEXIDINE GLUCONATE 0.12 % MT SOLN
15.0000 mL | Freq: Once | OROMUCOSAL | Status: AC
  Administered 2024-03-09: 15 mL via OROMUCOSAL
  Filled 2024-03-09: qty 15

## 2024-03-09 MED ORDER — HYDROCODONE-ACETAMINOPHEN 5-325 MG PO TABS
1.0000 | ORAL_TABLET | Freq: Four times a day (QID) | ORAL | 0 refills | Status: DC | PRN
  Filled 2024-03-09: qty 10, 3d supply, fill #0

## 2024-03-09 MED ORDER — HEPARIN 6000 UNIT IRRIGATION SOLUTION
Status: DC | PRN
Start: 2024-03-09 — End: 2024-03-09
  Administered 2024-03-09: 1

## 2024-03-09 MED ORDER — FENTANYL CITRATE (PF) 250 MCG/5ML IJ SOLN
INTRAMUSCULAR | Status: AC
  Filled 2024-03-09: qty 5

## 2024-03-09 MED ORDER — FENTANYL CITRATE (PF) 100 MCG/2ML IJ SOLN: 25.0000 ug | INTRAMUSCULAR | Status: DC | PRN

## 2024-03-09 MED ORDER — OXYCODONE HCL 5 MG/5ML PO SOLN: 5.0000 mg | Freq: Once | ORAL | Status: DC | PRN

## 2024-03-09 MED ORDER — DEXAMETHASONE SODIUM PHOSPHATE 10 MG/ML IJ SOLN
INTRAMUSCULAR | Status: DC | PRN
  Administered 2024-03-09: 4 mg via INTRAVENOUS

## 2024-03-09 MED ORDER — ONDANSETRON HCL 4 MG/2ML IJ SOLN
INTRAMUSCULAR | Status: DC | PRN
  Administered 2024-03-09: 4 mg via INTRAVENOUS

## 2024-03-09 MED ORDER — ACETAMINOPHEN 10 MG/ML IV SOLN: 1000.0000 mg | Freq: Once | INTRAVENOUS | Status: DC | PRN

## 2024-03-09 MED ORDER — OXYCODONE HCL 5 MG PO TABS: 5.0000 mg | ORAL_TABLET | Freq: Once | ORAL | Status: DC | PRN

## 2024-03-09 MED ORDER — SODIUM CHLORIDE 0.9 % IV SOLN: INTRAVENOUS | Status: DC | PRN

## 2024-03-09 MED ORDER — 0.9 % SODIUM CHLORIDE (POUR BTL) OPTIME
TOPICAL | Status: DC | PRN
  Administered 2024-03-09: 1000 mL

## 2024-03-09 SURGICAL SUPPLY — 27 items
ARMBAND PINK RESTRICT EXTREMIT (MISCELLANEOUS) ×2 IMPLANT
BAG COUNTER SPONGE SURGICOUNT (BAG) ×2 IMPLANT
BLADE CLIPPER SURG (BLADE) ×2 IMPLANT
BNDG ELASTIC 4X5.8 VLCR STR LF (GAUZE/BANDAGES/DRESSINGS) ×2 IMPLANT
CANISTER SUCTION 3000ML PPV (SUCTIONS) ×2 IMPLANT
CLIP TI MEDIUM 6 (CLIP) ×6 IMPLANT
CLIP TI WIDE RED SMALL 6 (CLIP) ×4 IMPLANT
COVER PROBE W GEL 5X96 (DRAPES) ×2 IMPLANT
DERMABOND ADVANCED .7 DNX12 (GAUZE/BANDAGES/DRESSINGS) ×2 IMPLANT
ELECTRODE REM PT RTRN 9FT ADLT (ELECTROSURGICAL) ×2 IMPLANT
GLOVE BIOGEL PI IND STRL 8 (GLOVE) ×2 IMPLANT
GOWN STRL REUS W/ TWL LRG LVL3 (GOWN DISPOSABLE) ×4 IMPLANT
GOWN STRL REUS W/TWL 2XL LVL3 (GOWN DISPOSABLE) ×4 IMPLANT
KIT BASIN OR (CUSTOM PROCEDURE TRAY) ×2 IMPLANT
KIT TURNOVER KIT B (KITS) ×2 IMPLANT
NS IRRIG 1000ML POUR BTL (IV SOLUTION) ×2 IMPLANT
PACK CV ACCESS (CUSTOM PROCEDURE TRAY) ×2 IMPLANT
PAD ARMBOARD POSITIONER FOAM (MISCELLANEOUS) ×4 IMPLANT
SLING ARM FOAM STRAP LRG (SOFTGOODS) IMPLANT
SPIKE FLUID TRANSFER (MISCELLANEOUS) ×2 IMPLANT
SUT MNCRL AB 4-0 PS2 18 (SUTURE) ×2 IMPLANT
SUT PROLENE 6 0 BV (SUTURE) ×2 IMPLANT
SUT SILK 3 0 SH CR/8 (SUTURE) ×2 IMPLANT
SUT VIC AB 3-0 SH 27X BRD (SUTURE) ×2 IMPLANT
TOWEL GREEN STERILE (TOWEL DISPOSABLE) ×2 IMPLANT
UNDERPAD 30X36 HEAVY ABSORB (UNDERPADS AND DIAPERS) ×2 IMPLANT
WATER STERILE IRR 1000ML POUR (IV SOLUTION) ×2 IMPLANT

## 2024-03-09 NOTE — Discharge Instructions (Signed)

## 2024-03-09 NOTE — H&P (Signed)
 Office Note   Patient seen and examined in preop holding.  No complaints. No changes to medication history or physical exam since last seen in clinic. After discussing the risks and benefits of right arm brachiobasilic fistula creation, [name] elected to proceed.   Fonda FORBES Rim MD   CC:  follow up Requesting Provider:  No ref. provider found  HPI: TALEN POSER is a 73 y.o. ([birth year]) male who presents status post removal of infected PD catheter and placement of left IJ TDC.  He has not had any prior HD access.  He is left arm dominant.  He has had a stroke in the past which affects his right arm.  He does not take any blood thinners.  He does not have a pacemaker.  If at all possible, he and his wife would prefer access placement in the right arm.  TDC has been functioning appropriately.  He is dialyzing on a  Tuesday, Thursday, Saturday schedule.   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)    Chronic kidney disease    Diabetes mellitus type II    Diabetic retinopathy FOLLOWED BY DR ELNER   ED (erectile dysfunction)    Glaucoma of both eyes    Hypertension    Hypertensive urgency 12/27/2023   Left hydrocele    Stroke [facility])     Past Surgical History:  Procedure Laterality Date   APPENDECTOMY  AGE EARLY 20'S   CAPD INSERTION N/A 04/11/2023   Procedure: LAPAROSCOPIC INSERTION CONTINUOUS AMBULATORY PERITONEAL DIALYSIS  (CAPD) CATHETER WITH  OMENTOPEXY;  Surgeon: Magda Debby SAILOR, MD;  Location: [facility] OR;  Service: Vascular;  Laterality: N/A;   CAPD REMOVAL N/A 12/30/2023   Procedure: CONTINUOUS AMBULATORY PERITONEAL DIALYSIS  (CAPD) CATHETER REMOVAL;  Surgeon: Rim Fonda FORBES, MD;  Location: [facility] OR;  Service: Vascular;  Laterality: N/A;   COLONOSCOPY  12/30/2020   every 5 years   HYDROCELE EXCISION  03/31/2012   Procedure:  HYDROCELECTOMY ADULT;  Surgeon: Garnette Shack, MD;  Location: [facility];  Service: Urology;  Laterality: Left;  45 MINS     INSERTION OF DIALYSIS CATHETER N/A 12/30/2023   Procedure: EXCHANGE OF DIALYSIS CATHETER;  Surgeon: Rim Fonda FORBES, MD;  Location: [facility] OR;  Service: Vascular;  Laterality: N/A;   IR FLUORO GUIDE CV LINE LEFT  12/27/2023   IR US  GUIDE VASC ACCESS LEFT  12/27/2023   LEFT EYE LASER RETINA REPAIR  SEPT 2012   RIGHT EYE VITRECTOMY/ INSERTION GLAUCOMA SETON/ LASER REPAIR  12-13-2008   RETINAL ARTERY OCCLUSION /NEOVASCULAR GLAUCOMA/ HEMORRHAGE   RIGHT EYE VITRETOMY/ INSERTION GLAUCOMA SETON X2/ LASER  03-24-2009   RECURRENT HEMORRHAGE/ OCCLUSION INTERNAL SETON   SHOULDER ARTHROSCOPY Right 2005   undescended right testicle removed  1994    Social History   Socioeconomic History   Marital status: Married    Spouse name: Not on file   Number of children: 1   Years of education: 12   Highest education level: 12th grade  Occupational History   Occupation: Disabled  Tobacco Use   Smoking status: Never   Smokeless tobacco: Never  Vaping Use   Vaping status: Never Used  Substance and Sexual Activity   Alcohol use: No   Drug use: No   Sexual activity: Not on file  Other Topics Concern   Not on  file  Social History Narrative   Lives at home with his wife and granddaughter.   Left-handed.   3 cups caffeine per day.   Social Drivers of Corporate investment banker Strain: Low Risk  (02/07/2024)   Overall Financial Resource Strain (CARDIA)    Difficulty of Paying Living Expenses: Not hard at all  Food Insecurity: No Food Insecurity (02/07/2024)   Hunger Vital Sign    Worried About Running Out of Food in the Last Year: Never true    Ran Out of Food in the Last Year: Never true  Transportation Needs: No Transportation Needs (02/07/2024)   PRAPARE - Administrator, Civil Service (Medical): No    Lack of Transportation (Non-Medical): No   Physical Activity: Insufficiently Active (02/07/2024)   Exercise Vital Sign    Days of Exercise per Week: 2 days    Minutes of Exercise per Session: 20 min  Stress: No Stress Concern Present (02/07/2024)   [employer] of Occupational Health - Occupational Stress Questionnaire    Feeling of Stress : Not at all  Social Connections: Socially Integrated (02/07/2024)   Social Connection and Isolation Panel    Frequency of Communication with Friends and Family: More than three times a week    Frequency of Social Gatherings with Friends and Family: More than three times a week    Attends Religious Services: More than 4 times per year    Active Member of [employer] or Organizations: Yes    Attends Engineer, structural: More than 4 times per year    Marital Status: Married  Catering manager Violence: Not At Risk (02/07/2024)   Humiliation, Afraid, Rape, and Kick questionnaire    Fear of Current or Ex-Partner: No    Emotionally Abused: No    Physically Abused: No    Sexually Abused: No    Family History  Problem Relation Age of Onset   Glaucoma Mother    Diabetes Mother    Stomach cancer Maternal Grandfather    Stroke Maternal Grandmother     Current Facility-Administered Medications  Medication Dose Route Frequency Provider Last Rate Last Admin   ceFAZolin  (ANCEF ) IVPB 2g/100 mL premix  2 g Intravenous 30 min Pre-Op  Calbert Hulsebus E, MD       chlorhexidine  (HIBICLENS ) 4 % liquid 4 Application  60 mL Topical Once Omeka Holben E, MD       chlorhexidine  (PERIDEX ) 0.12 % solution 15 mL  15 mL Mouth/Throat Once Keneth Lynwood POUR, MD       Or   Oral care mouth rinse  15 mL Mouth Rinse Once Keneth Lynwood POUR, MD       insulin  aspart (novoLOG ) injection 0-7 Units  0-7 Units Subcutaneous Q2H PRN Keneth Lynwood POUR, MD       sodium chloride  flush (NS) 0.9 % injection 3-10 mL  3-10 mL Intravenous PRN Marcine Gadway E, MD        Allergies  Allergen Reactions   Lactose Intolerance (Gi) Diarrhea    Peach [Prunus Persica] Itching and Other (See Comments)    ITCHY THROAT   Apple Pectin [Pectin] Itching and Other (See Comments)    ITCHY THROAT   Metformin  And Related Other (See Comments)    D/t decreased kidney function    Morphine  And Codeine Anxiety     REVIEW OF SYSTEMS:   [X]  denotes positive finding, [ ]  denotes negative finding Cardiac  Comments:  Chest pain or chest pressure:  Shortness of breath upon exertion:    Short of breath when lying flat:    Irregular heart rhythm:        Vascular    Pain in calf, thigh, or hip brought on by ambulation:    Pain in feet at night that wakes you up from your sleep:     Blood clot in your veins:    Leg swelling:         Pulmonary    Oxygen at home:    Productive cough:     Wheezing:         Neurologic    Sudden weakness in arms or legs:     Sudden numbness in arms or legs:     Sudden onset of difficulty speaking or slurred speech:    Temporary loss of vision in one eye:     Problems with dizziness:         Gastrointestinal    Blood in stool:     Vomited blood:         Genitourinary    Burning when urinating:     Blood in urine:        Psychiatric    Major depression:         Hematologic    Bleeding problems:    Problems with blood clotting too easily:        Skin    Rashes or ulcers:        Constitutional    Fever or chills:      PHYSICAL EXAMINATION:  Vitals:   03/09/24 0708  BP: 137/72  Pulse: 70  Resp: 18  Temp: 97.9 F (36.6 C)  SpO2: 100%  Weight: 74.8 kg  Height: 5' 9 (1.753 m)    General:  WDWN in NAD; vital signs documented above Gait: Not observed HENT: WNL, normocephalic Pulmonary: normal non-labored breathing Cardiac: regular HR Abdomen: soft, NT, no masses; incisions healed Skin: without rashes Vascular Exam/Pulses: Symmetrical radial pulses Extremities: without ischemic changes, without Gangrene , without cellulitis; without open wounds;  Musculoskeletal: no muscle wasting  or atrophy  Neurologic: A&O X 3 Psychiatric:  The pt has Normal affect.   Non-Invasive Vascular Imaging:   Fistula preop imaging: Arterial duplex without hemodynamically significant stenosis.  Brachial artery bifurcates near the [facility] fossa and the lower arm  Adequate basilic vein for conduit use bilateral upper extremities    ASSESSMENT/PLAN:: 73 y.o. male here to discuss hemodialysis access  Mr. Murphy is a 73 year old male who recently had an infected PD catheter removed.  He had a left IJ TDC placed at the same time.  He is dialyzing via [facility] without issue.  He is here to discuss permanent access.  Preop imaging demonstrates adequate basilic vein for conduit use in both arms.  He had a prior stroke affecting his right arm.  The patient and his wife would like access placement in the right arm if at all possible as he is left arm dominant.  Positioning will be difficult for surgery especially with eventual second stage basilic vein transposition.  He will be evaluated preoperatively on day of surgery to see if he would be best served with basilic vein fistula creation or AV graft placement.  Dialysis access surgery was discussed in detail with the patient and his wife including risks and they are agreeable to proceed.   Fonda FORBES Rim, PA-C [facility] [phone]  Clinic MD:   Serene

## 2024-03-09 NOTE — Anesthesia Procedure Notes (Signed)
 Procedure Name: LMA Insertion Date/Time: 03/09/2024 8:49 AM  Performed by: Roslynn Waddell LABOR, CRNAPre-anesthesia Checklist: Patient identified, Emergency Drugs available, Suction available and Patient being monitored Patient Re-evaluated:Patient Re-evaluated prior to induction Oxygen Delivery Method: Circle System Utilized Preoxygenation: Pre-oxygenation with 100% oxygen Induction Type: IV induction LMA: LMA with gastric port inserted LMA Size: 4.0 Number of attempts: 1 Airway Equipment and Method: Bite block Placement Confirmation: positive ETCO2 Tube secured with: Tape Dental Injury: Teeth and Oropharynx as per pre-operative assessment  Comments: Atraumatic induction/LMA insertion. Dentition and oral mucosa as per preop. Patient with extremely limited cervical extension. Multiple pillows placed until patient verbalized neck was comfortable and not hanging.

## 2024-03-09 NOTE — Anesthesia Preprocedure Evaluation (Signed)
 Anesthesia Evaluation  Patient identified by MRN, date of birth, ID band Patient awake    Reviewed: Allergy & Precautions, NPO status , Patient's Chart, lab work & pertinent test results, reviewed documented beta blocker date and time   History of Anesthesia Complications Negative for: history of anesthetic complications  Airway Mallampati: II  TM Distance: >3 FB     Dental  (+) Poor Dentition   Pulmonary asthma    breath sounds clear to auscultation       Cardiovascular hypertension, (-) CAD, (-) Past MI, (-) Cardiac Stents and (-) CABG  Rhythm:Regular Rate:Normal     Neuro/Psych  Neuromuscular disease CVA, Residual Symptoms    GI/Hepatic ,,,(+) neg Cirrhosis        Endo/Other  diabetes, Type 2    Renal/GU ESRF and DialysisRenal disease     Musculoskeletal  (+) Arthritis , Osteoarthritis,    Abdominal   Peds  Hematology  (+) Blood dyscrasia, anemia   Anesthesia Other Findings   Reproductive/Obstetrics                              Anesthesia Physical Anesthesia Plan  ASA: 3  Anesthesia Plan: MAC   Post-op Pain Management: Regional block*   Induction: Intravenous  PONV Risk Score and Plan: 1 and Ondansetron  and Propofol  infusion  Airway Management Planned: Natural Airway and Simple Face Mask  Additional Equipment:   Intra-op Plan:   Post-operative Plan:   Informed Consent: I have reviewed the patients History and Physical, chart, labs and discussed the procedure including the risks, benefits and alternatives for the proposed anesthesia with the patient or authorized representative who has indicated his/her understanding and acceptance.     Dental advisory given  Plan Discussed with: CRNA  Anesthesia Plan Comments:         Anesthesia Quick Evaluation

## 2024-03-09 NOTE — Progress Notes (Signed)
 Office Note   Patient seen and examined in preop holding.  No complaints. No changes to medication history or physical exam since last seen in clinic. After discussing the risks and benefits of right arm brachiobasilic fistula creation, [name] elected to proceed.   Fonda FORBES Rim MD   CC:  follow up Requesting Provider:  No ref. provider found  HPI: Russell Austin is a 73 y.o. ([birth year]) male who presents status post removal of infected PD catheter and placement of left IJ TDC.  He has not had any prior HD access.  He is left arm dominant.  He has had a stroke in the past which affects his right arm.  He does not take any blood thinners.  He does not have a pacemaker.  If at all possible, he and his wife would prefer access placement in the right arm.  TDC has been functioning appropriately.  He is dialyzing on a  Tuesday, Thursday, Saturday schedule.   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)    Chronic kidney disease    Diabetes mellitus type II    Diabetic retinopathy FOLLOWED BY DR ELNER   ED (erectile dysfunction)    Glaucoma of both eyes    Hypertension    Hypertensive urgency 12/27/2023   Left hydrocele    Stroke [facility])     Past Surgical History:  Procedure Laterality Date   APPENDECTOMY  AGE EARLY 20'S   CAPD INSERTION N/A 04/11/2023   Procedure: LAPAROSCOPIC INSERTION CONTINUOUS AMBULATORY PERITONEAL DIALYSIS  (CAPD) CATHETER WITH  OMENTOPEXY;  Surgeon: Magda Debby SAILOR, MD;  Location: [facility] OR;  Service: Vascular;  Laterality: N/A;   CAPD REMOVAL N/A 12/30/2023   Procedure: CONTINUOUS AMBULATORY PERITONEAL DIALYSIS  (CAPD) CATHETER REMOVAL;  Surgeon: Rim Fonda FORBES, MD;  Location: [facility] OR;  Service: Vascular;  Laterality: N/A;   COLONOSCOPY  12/30/2020   every 5 years   HYDROCELE EXCISION  03/31/2012   Procedure:  HYDROCELECTOMY ADULT;  Surgeon: Garnette Shack, MD;  Location: [facility];  Service: Urology;  Laterality: Left;  45 MINS     INSERTION OF DIALYSIS CATHETER N/A 12/30/2023   Procedure: EXCHANGE OF DIALYSIS CATHETER;  Surgeon: Rim Fonda FORBES, MD;  Location: [facility] OR;  Service: Vascular;  Laterality: N/A;   IR FLUORO GUIDE CV LINE LEFT  12/27/2023   IR US  GUIDE VASC ACCESS LEFT  12/27/2023   LEFT EYE LASER RETINA REPAIR  SEPT 2012   RIGHT EYE VITRECTOMY/ INSERTION GLAUCOMA SETON/ LASER REPAIR  12-13-2008   RETINAL ARTERY OCCLUSION /NEOVASCULAR GLAUCOMA/ HEMORRHAGE   RIGHT EYE VITRETOMY/ INSERTION GLAUCOMA SETON X2/ LASER  03-24-2009   RECURRENT HEMORRHAGE/ OCCLUSION INTERNAL SETON   SHOULDER ARTHROSCOPY Right 2005   undescended right testicle removed  1994    Social History   Socioeconomic History   Marital status: Married    Spouse name: Not on file   Number of children: 1   Years of education: 12   Highest education level: 12th grade  Occupational History   Occupation: Disabled  Tobacco Use   Smoking status: Never   Smokeless tobacco: Never  Vaping Use   Vaping status: Never Used  Substance and Sexual Activity   Alcohol use: No   Drug use: No   Sexual activity: Not on file  Other Topics Concern   Not on  file  Social History Narrative   Lives at home with his wife and granddaughter.   Left-handed.   3 cups caffeine per day.   Social Drivers of Corporate investment banker Strain: Low Risk  (02/07/2024)   Overall Financial Resource Strain (CARDIA)    Difficulty of Paying Living Expenses: Not hard at all  Food Insecurity: No Food Insecurity (02/07/2024)   Hunger Vital Sign    Worried About Running Out of Food in the Last Year: Never true    Ran Out of Food in the Last Year: Never true  Transportation Needs: No Transportation Needs (02/07/2024)   PRAPARE - Administrator, Civil Service (Medical): No    Lack of Transportation (Non-Medical): No   Physical Activity: Insufficiently Active (02/07/2024)   Exercise Vital Sign    Days of Exercise per Week: 2 days    Minutes of Exercise per Session: 20 min  Stress: No Stress Concern Present (02/07/2024)   [employer] of Occupational Health - Occupational Stress Questionnaire    Feeling of Stress : Not at all  Social Connections: Socially Integrated (02/07/2024)   Social Connection and Isolation Panel    Frequency of Communication with Friends and Family: More than three times a week    Frequency of Social Gatherings with Friends and Family: More than three times a week    Attends Religious Services: More than 4 times per year    Active Member of [employer] or Organizations: Yes    Attends Engineer, structural: More than 4 times per year    Marital Status: Married  Catering manager Violence: Not At Risk (02/07/2024)   Humiliation, Afraid, Rape, and Kick questionnaire    Fear of Current or Ex-Partner: No    Emotionally Abused: No    Physically Abused: No    Sexually Abused: No    Family History  Problem Relation Age of Onset   Glaucoma Mother    Diabetes Mother    Stomach cancer Maternal Grandfather    Stroke Maternal Grandmother     Current Facility-Administered Medications  Medication Dose Route Frequency Provider Last Rate Last Admin   ceFAZolin  (ANCEF ) IVPB 2g/100 mL premix  2 g Intravenous 30 min Pre-Op  Calbert Hulsebus E, MD       chlorhexidine  (HIBICLENS ) 4 % liquid 4 Application  60 mL Topical Once Omeka Holben E, MD       chlorhexidine  (PERIDEX ) 0.12 % solution 15 mL  15 mL Mouth/Throat Once Keneth Lynwood POUR, MD       Or   Oral care mouth rinse  15 mL Mouth Rinse Once Keneth Lynwood POUR, MD       insulin  aspart (novoLOG ) injection 0-7 Units  0-7 Units Subcutaneous Q2H PRN Keneth Lynwood POUR, MD       sodium chloride  flush (NS) 0.9 % injection 3-10 mL  3-10 mL Intravenous PRN Marcine Gadway E, MD        Allergies  Allergen Reactions   Lactose Intolerance (Gi) Diarrhea    Peach [Prunus Persica] Itching and Other (See Comments)    ITCHY THROAT   Apple Pectin [Pectin] Itching and Other (See Comments)    ITCHY THROAT   Metformin  And Related Other (See Comments)    D/t decreased kidney function    Morphine  And Codeine Anxiety     REVIEW OF SYSTEMS:   [X]  denotes positive finding, [ ]  denotes negative finding Cardiac  Comments:  Chest pain or chest pressure:  Shortness of breath upon exertion:    Short of breath when lying flat:    Irregular heart rhythm:        Vascular    Pain in calf, thigh, or hip brought on by ambulation:    Pain in feet at night that wakes you up from your sleep:     Blood clot in your veins:    Leg swelling:         Pulmonary    Oxygen at home:    Productive cough:     Wheezing:         Neurologic    Sudden weakness in arms or legs:     Sudden numbness in arms or legs:     Sudden onset of difficulty speaking or slurred speech:    Temporary loss of vision in one eye:     Problems with dizziness:         Gastrointestinal    Blood in stool:     Vomited blood:         Genitourinary    Burning when urinating:     Blood in urine:        Psychiatric    Major depression:         Hematologic    Bleeding problems:    Problems with blood clotting too easily:        Skin    Rashes or ulcers:        Constitutional    Fever or chills:      PHYSICAL EXAMINATION:  Vitals:   03/09/24 0708  BP: 137/72  Pulse: 70  Resp: 18  Temp: 97.9 F (36.6 C)  SpO2: 100%  Weight: 74.8 kg  Height: 5' 9 (1.753 m)    General:  WDWN in NAD; vital signs documented above Gait: Not observed HENT: WNL, normocephalic Pulmonary: normal non-labored breathing Cardiac: regular HR Abdomen: soft, NT, no masses; incisions healed Skin: without rashes Vascular Exam/Pulses: Symmetrical radial pulses Extremities: without ischemic changes, without Gangrene , without cellulitis; without open wounds;  Musculoskeletal: no muscle wasting  or atrophy  Neurologic: A&O X 3 Psychiatric:  The pt has Normal affect.   Non-Invasive Vascular Imaging:   Fistula preop imaging: Arterial duplex without hemodynamically significant stenosis.  Brachial artery bifurcates near the [facility] fossa and the lower arm  Adequate basilic vein for conduit use bilateral upper extremities    ASSESSMENT/PLAN:: 73 y.o. male here to discuss hemodialysis access  Mr. Murphy is a 73 year old male who recently had an infected PD catheter removed.  He had a left IJ TDC placed at the same time.  He is dialyzing via [facility] without issue.  He is here to discuss permanent access.  Preop imaging demonstrates adequate basilic vein for conduit use in both arms.  He had a prior stroke affecting his right arm.  The patient and his wife would like access placement in the right arm if at all possible as he is left arm dominant.  Positioning will be difficult for surgery especially with eventual second stage basilic vein transposition.  He will be evaluated preoperatively on day of surgery to see if he would be best served with basilic vein fistula creation or AV graft placement.  Dialysis access surgery was discussed in detail with the patient and his wife including risks and they are agreeable to proceed.   Fonda FORBES Rim, PA-C [facility] [phone]  Clinic MD:   Serene

## 2024-03-09 NOTE — Op Note (Addendum)
    NAME: Russell Austin    MRN: [id number] DOB: 26-May-1951    DATE OF OPERATION: 03/09/2024  PREOP DIAGNOSIS:    End stage renal disease   POSTOP DIAGNOSIS:    Same  PROCEDURE:    Right arm brachiobasilic fistula creation  SURGEON: Fonda FORBES Rim  ASSIST: Donnice Sender, PA  ANESTHESIA: General  EBL: 5 mL  INDICATIONS:    WILIAM CAUTHORN is a 73 y.o. male with end-stage renal disease currently undergoing dialysis using a tunneled dialysis catheter.  After discussing the risks and benefits of right arm brachiobasilic fistula creation, Joanathan elected to proceed.  FINDINGS:   4.5 centimeter brachiobasilic vein 4.5cm brachial artery  TECHNIQUE:   The patient was brought to the operating room and placed in supine position. The right arm was prepped and draped in a standard fashion. IV antibiotics were prior to incision. A timeout was performed.   The basilic vein in the Right arm was identified using ultrasound and appeared of sufficient size. A transverse incision was made above the elbow creese in the antecubital fossa. The basilic vein was identified and isolated for 4 cm in length.  The bicipital aponeurosis was partially released and the brachial artery freed from its paired brachial veins and secured with a vessel loop. The patient was heparinized. The basilic vein was marked and ligated distally with 2-0 silk, then flushed with heparinized saline. Vascular clamps were placed proximally and distally on the brachial artery and a 5 mm arteriotomy  was created on the brachial artery. This was flushed with heparin  saline. The vein was juxtaposed to the artery and an anastomosis was created using 6-0 Prolene.   Prior to completing the anastomsis, the vessels were flushed and the suture line was tied down. There was an excellent thrill in the basilic vein from the anastomosis into the upper arm. The patient had a 2+ radial pulse. The incision was irrigated and hemostasis  acheived. The deeper tissue was closed with 3-0 Vicryl and the skin closed with 4-0 Monocryl.    Given the complexity of the case,  the assistant was necessary in order to expedient the procedure and safely perform the technical aspects of the operation.  The assistant provided traction and countertraction to assist with exposure of the artery and vein.  They also assisted with suture ligation of multiple venous branches.  They played a critical role in the anastomosis. These skills, especially following the Prolene suture for the anastomosis, could not have been adequately performed by a scrub tech assistant.    Dermabond was applied the incisions. He was transferred to PACU in stable condition.     Fonda FORBES Rim, MD [facility] DATE OF DICTATION:   03/09/2024

## 2024-03-09 NOTE — Transfer of Care (Signed)
 Immediate Anesthesia Transfer of Care Note  Patient: Russell Austin  Procedure(s) Performed: ARTERIOVENOUS (AV) FISTULA CREATION (Right: Arm Upper)  Patient Location: PACU  Anesthesia Type:General  Level of Consciousness: drowsy  Airway & Oxygen Therapy: Patient Spontanous Breathing and Patient connected to face mask oxygen  Post-op Assessment: Report given to RN and Post -op Vital signs reviewed and stable  Post vital signs: Reviewed and stable  Last Vitals:  Vitals Value Taken Time  BP 132/66 03/09/24 10:00  Temp    Pulse 61 03/09/24 10:02  Resp 13 03/09/24 10:02  SpO2 100 % 03/09/24 10:02  Vitals shown include unfiled device data.  Last Pain:  Vitals:   03/09/24 0805  PainSc: 0-No pain         Complications: No notable events documented.

## 2024-03-09 NOTE — Anesthesia Postprocedure Evaluation (Signed)
 Anesthesia Post Note  Patient: Starleen LABOR Lees  Procedure(s) Performed: ARTERIOVENOUS (AV) FISTULA CREATION (Right: Arm Upper)     Patient location during evaluation: PACU Anesthesia Type: MAC Level of consciousness: awake and alert Pain management: pain level controlled Vital Signs Assessment: post-procedure vital signs reviewed and stable Respiratory status: spontaneous breathing, nonlabored ventilation, respiratory function stable and patient connected to nasal cannula oxygen Cardiovascular status: stable and blood pressure returned to baseline Postop Assessment: no apparent nausea or vomiting Anesthetic complications: no   No notable events documented.  Last Vitals:  Vitals:   03/09/24 1015 03/09/24 1030  BP: 127/65 124/64  Pulse: 63 62  Resp: 11 11  Temp:  36.5 C  SpO2: 100% 100%    Last Pain:  Vitals:   03/09/24 1030  PainSc: 0-No pain                 Lynwood MARLA Cornea

## 2024-03-10 ENCOUNTER — Encounter (HOSPITAL_COMMUNITY): Payer: Self-pay | Admitting: Vascular Surgery

## 2024-03-10 DIAGNOSIS — D689 Coagulation defect, unspecified: Secondary | ICD-10-CM | POA: Diagnosis not present

## 2024-03-10 DIAGNOSIS — Z992 Dependence on renal dialysis: Secondary | ICD-10-CM | POA: Diagnosis not present

## 2024-03-10 DIAGNOSIS — E875 Hyperkalemia: Secondary | ICD-10-CM | POA: Diagnosis not present

## 2024-03-10 DIAGNOSIS — T8249XD Other complication of vascular dialysis catheter, subsequent encounter: Secondary | ICD-10-CM | POA: Diagnosis not present

## 2024-03-10 DIAGNOSIS — N2581 Secondary hyperparathyroidism of renal origin: Secondary | ICD-10-CM | POA: Diagnosis not present

## 2024-03-10 DIAGNOSIS — N186 End stage renal disease: Secondary | ICD-10-CM | POA: Diagnosis not present

## 2024-03-12 ENCOUNTER — Ambulatory Visit: Admitting: Adult Health

## 2024-03-15 DIAGNOSIS — I6302 Cerebral infarction due to thrombosis of basilar artery: Secondary | ICD-10-CM | POA: Diagnosis not present

## 2024-03-15 DIAGNOSIS — G8191 Hemiplegia, unspecified affecting right dominant side: Secondary | ICD-10-CM | POA: Diagnosis not present

## 2024-03-17 DIAGNOSIS — N186 End stage renal disease: Secondary | ICD-10-CM | POA: Diagnosis not present

## 2024-03-17 DIAGNOSIS — Z992 Dependence on renal dialysis: Secondary | ICD-10-CM | POA: Diagnosis not present

## 2024-03-17 DIAGNOSIS — T8249XD Other complication of vascular dialysis catheter, subsequent encounter: Secondary | ICD-10-CM | POA: Diagnosis not present

## 2024-03-17 DIAGNOSIS — N2581 Secondary hyperparathyroidism of renal origin: Secondary | ICD-10-CM | POA: Diagnosis not present

## 2024-03-17 DIAGNOSIS — D689 Coagulation defect, unspecified: Secondary | ICD-10-CM | POA: Diagnosis not present

## 2024-03-17 DIAGNOSIS — E1122 Type 2 diabetes mellitus with diabetic chronic kidney disease: Secondary | ICD-10-CM | POA: Diagnosis not present

## 2024-03-18 ENCOUNTER — Ambulatory Visit

## 2024-03-18 ENCOUNTER — Encounter: Payer: Self-pay | Admitting: Adult Health

## 2024-03-18 ENCOUNTER — Ambulatory Visit: Admitting: Adult Health

## 2024-03-18 VITALS — BP 120/60 | HR 82 | Temp 98.2°F | Ht 69.0 in | Wt 131.0 lb

## 2024-03-18 DIAGNOSIS — F419 Anxiety disorder, unspecified: Secondary | ICD-10-CM

## 2024-03-18 DIAGNOSIS — Z7984 Long term (current) use of oral hypoglycemic drugs: Secondary | ICD-10-CM

## 2024-03-18 DIAGNOSIS — E119 Type 2 diabetes mellitus without complications: Secondary | ICD-10-CM

## 2024-03-18 DIAGNOSIS — I10 Essential (primary) hypertension: Secondary | ICD-10-CM

## 2024-03-18 DIAGNOSIS — N186 End stage renal disease: Secondary | ICD-10-CM | POA: Diagnosis not present

## 2024-03-18 DIAGNOSIS — F32A Depression, unspecified: Secondary | ICD-10-CM

## 2024-03-18 LAB — POCT GLYCOSYLATED HEMOGLOBIN (HGB A1C): Hemoglobin A1C: 6.9 % — AB (ref 4.0–5.6)

## 2024-03-18 MED ORDER — METOPROLOL TARTRATE 100 MG PO TABS: 100.0000 mg | ORAL_TABLET | Freq: Two times a day (BID) | ORAL | 3 refills | Status: DC

## 2024-03-18 MED ORDER — TIZANIDINE HCL 4 MG PO TABS: 4.0000 mg | ORAL_TABLET | Freq: Four times a day (QID) | ORAL | 1 refills | Status: AC | PRN

## 2024-03-18 NOTE — Progress Notes (Signed)
 Subjective:    Patient ID: Russell Austin, male    DOB: [birth year], 73 y.o.   MRN: [id number]  HPI  He presents to the office today for three month follow up regarding DM and HTN   DM II -he is currently managed with  glipizide  5 mg at bedtime. He denies hypo Lab Results  Component Value Date   HGBA1C 6.9 (A) 03/18/2024   HGBA1C 6.8 (A) 12/10/2023   HGBA1C 9.5 (A) 08/29/2023    Hypertension with chronic kidney disease on dialysis -managed with Norvasc  5 mg daily, losartan  100 mg daily, and Metoprolol  XR 100 mg BID,   He denies dizziness, lightheadedness, chest pain, shortness of breath, or syncopal episodes. He reports that his nephrologist dropped metoprolol  and torsemide  due to hypotension  BP Readings from Last 3 Encounters:  03/18/24 120/60  03/09/24 124/64  03/02/24 (!) 144/83   Additionally, wife reports that he appears to be severely depressed and has been having some anxiety despite taking Wellbutrin . He is very irritable and gets annoyed easily.   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)    Chronic kidney disease    Diabetes mellitus type II    Diabetic retinopathy FOLLOWED BY DR ELNER   ED (erectile dysfunction)    Glaucoma of both eyes    Hypertension    Hypertensive urgency 12/27/2023   Left hydrocele    Stroke [facility])     Social History   Socioeconomic History   Marital status: Married    Spouse name: Not on file   Number of children: 1   Years of education: 12   Highest education level: 12th grade  Occupational History   Occupation: Disabled  Tobacco Use   Smoking status: Never   Smokeless tobacco: Never  Vaping Use   Vaping status: Never Used  Substance and Sexual Activity   Alcohol use: No   Drug use: No   Sexual activity: Not on file  Other Topics Concern   Not on file   Social History Narrative   Lives at home with his wife and granddaughter.   Left-handed.   3 cups caffeine per day.   Social Drivers of Corporate investment banker Strain: Low Risk  (03/14/2024)   Overall Financial Resource Strain (CARDIA)    Difficulty of Paying Living Expenses: Not hard at all  Food Insecurity: No Food Insecurity (03/14/2024)   Hunger Vital Sign    Worried About Running Out of Food in the Last Year: Never true    Ran Out of Food in the Last Year: Never true  Transportation Needs: No Transportation Needs (03/14/2024)   PRAPARE - Administrator, Civil Service (Medical): No    Lack of Transportation (Non-Medical): No  Physical Activity: Inactive (03/14/2024)   Exercise Vital Sign    Days of Exercise per Week: 0 days    Minutes of Exercise per Session: Not on file  Stress: Stress Concern Present (03/14/2024)   [employer] of Occupational Health - Occupational Stress Questionnaire    Feeling of Stress: Very much  Social Connections: Socially Integrated (03/14/2024)   Social Connection and Isolation Panel    Frequency of Communication with Friends and Family: More than three times a week    Frequency of Social Gatherings with Friends and Family: Twice a  week    Attends Religious Services: More than 4 times per year    Active Member of Clubs or Organizations: Yes    Attends Banker Meetings: Never    Marital Status: Married  Catering manager Violence: Not At Risk (02/07/2024)   Humiliation, Afraid, Rape, and Kick questionnaire    Fear of Current or Ex-Partner: No    Emotionally Abused: No    Physically Abused: No    Sexually Abused: No    Past Surgical History:  Procedure Laterality Date   APPENDECTOMY  AGE EARLY 20'S   AV FISTULA PLACEMENT Right 03/09/2024   Procedure: ARTERIOVENOUS (AV) FISTULA CREATION;  Surgeon: Lanis Fonda BRAVO, MD;  Location: [facility] OR;  Service: Vascular;  Laterality: Right;   CAPD INSERTION N/A 04/11/2023   Procedure:  LAPAROSCOPIC INSERTION CONTINUOUS AMBULATORY PERITONEAL DIALYSIS  (CAPD) CATHETER WITH  OMENTOPEXY;  Surgeon: Magda Debby SAILOR, MD;  Location: [facility] OR;  Service: Vascular;  Laterality: N/A;   CAPD REMOVAL N/A 12/30/2023   Procedure: CONTINUOUS AMBULATORY PERITONEAL DIALYSIS  (CAPD) CATHETER REMOVAL;  Surgeon: Lanis Fonda BRAVO, MD;  Location: [facility] OR;  Service: Vascular;  Laterality: N/A;   COLONOSCOPY  12/30/2020   every 5 years   HYDROCELE EXCISION  03/31/2012   Procedure: HYDROCELECTOMY ADULT;  Surgeon: Garnette Shack, MD;  Location: [facility];  Service: Urology;  Laterality: Left;  45 MINS     INSERTION OF DIALYSIS CATHETER N/A 12/30/2023   Procedure: EXCHANGE OF DIALYSIS CATHETER;  Surgeon: Lanis Fonda BRAVO, MD;  Location: [facility] OR;  Service: Vascular;  Laterality: N/A;   IR FLUORO GUIDE CV LINE LEFT  12/27/2023   IR US  GUIDE VASC ACCESS LEFT  12/27/2023   LEFT EYE LASER RETINA REPAIR  SEPT 2012   RIGHT EYE VITRECTOMY/ INSERTION GLAUCOMA SETON/ LASER REPAIR  12-13-2008   RETINAL ARTERY OCCLUSION /NEOVASCULAR GLAUCOMA/ HEMORRHAGE   RIGHT EYE VITRETOMY/ INSERTION GLAUCOMA SETON X2/ LASER  03-24-2009   RECURRENT HEMORRHAGE/ OCCLUSION INTERNAL SETON   SHOULDER ARTHROSCOPY Right 2005   undescended right testicle removed  1994    Family History  Problem Relation Age of Onset   Glaucoma Mother    Diabetes Mother    Stomach cancer Maternal Grandfather    Stroke Maternal Grandmother     Allergies  Allergen Reactions   Lactose Intolerance (Gi) Diarrhea   Peach [Prunus Persica] Itching and Other (See Comments)    ITCHY THROAT   Apple Pectin [Pectin] Itching and Other (See Comments)    ITCHY THROAT   Metformin  And Related Other (See Comments)    D/t decreased kidney function    Morphine  And Codeine Anxiety    Current Outpatient Medications on File Prior to Visit  Medication Sig Dispense Refill   albuterol  (PROVENTIL ) (2.5 MG/3ML) 0.083% nebulizer solution Take 3 mLs (2.5  mg total) by nebulization every 6 (six) hours as needed for wheezing or shortness of breath. 75 mL 12   amLODipine  (NORVASC ) 10 MG tablet Take 1 tablet (10 mg total) by mouth daily. (Patient taking differently: Take 5 mg by mouth daily.) 30 tablet 0   aspirin  EC 81 MG tablet Take 81 mg by mouth daily.     [Paused] atorvastatin  (LIPITOR) 10 MG tablet Take 1 tablet (10 mg total) by mouth every morning. 90 tablet 3   buPROPion  (WELLBUTRIN  XL) 300 MG 24 hr tablet TAKE 1 TABLET (300 MG TOTAL) BY MOUTH EVERY MORNING. 90 tablet 1   calcitRIOL  (ROCALTROL ) 0.5 MCG capsule Take 1  capsule (0.5 mcg total) by mouth daily.     chlorproMAZINE  (THORAZINE ) 25 MG tablet TAKE 1 TABLET BY MOUTH THREE TIMES A DAY (Patient taking differently: Take 25 mg by mouth daily as needed for hiccoughs.) 270 tablet 1   Continuous Glucose Sensor (FREESTYLE LIBRE 3 PLUS SENSOR) MISC Change sensor every 15 days. (Patient taking differently: Inject 1 Device into the skin every 14 (fourteen) days.) 6 each 3   gabapentin  (NEURONTIN ) 300 MG capsule TAKE ONE CAPSULE BY MOUTH EVERYDAY AT BEDTIME (Patient taking differently: Take 300 mg by mouth at bedtime.) 90 capsule 3   gentamicin  cream (GARAMYCIN ) 0.1 % Apply 1 Application topically daily.     glipiZIDE  (GLUCOTROL  XL) 5 MG 24 hr tablet Take 5 mg by mouth at bedtime.     lactulose (CHRONULAC) 10 GM/15ML solution Take by mouth daily as needed for mild constipation or moderate constipation.     losartan  (COZAAR ) 100 MG tablet TAKE 1 TABLET BY MOUTH EVERY DAY IN THE MORNING 90 tablet 1   metoCLOPramide  (REGLAN ) 10 MG tablet TAKE 1 TABLET BY MOUTH 4 TIMES DAILY. (Patient taking differently: Take 10 mg by mouth daily as needed for nausea or vomiting.) 120 tablet 0   pantoprazole  (PROTONIX ) 40 MG tablet Take 1 tablet (40 mg total) by mouth daily. 90 tablet 0   potassium chloride  SA (KLOR-CON  M) 20 MEQ tablet Take 1 tablet (20 mEq total) by mouth daily. 30 tablet 3   No current  facility-administered medications on file prior to visit.    BP 120/60   Pulse 82   Temp 98.2 F (36.8 C) (Oral)   Ht 5' 9 (1.753 m)   Wt 131 lb (59.4 kg)   SpO2 98%   BMI 19.35 kg/m       Objective:   Physical Exam Vitals and nursing note reviewed.  Constitutional:      Appearance: Normal appearance. He is obese.  Cardiovascular:     Rate and Rhythm: Normal rate and regular rhythm.     Pulses: Normal pulses.     Heart sounds: Normal heart sounds.  Pulmonary:     Effort: Pulmonary effort is normal.     Breath sounds: Normal breath sounds.  Skin:    General: Skin is warm and dry.  Neurological:     General: No focal deficit present.     Mental Status: He is alert and oriented to person, place, and time.  Psychiatric:        Mood and Affect: Mood normal.        Behavior: Behavior normal.        Thought Content: Thought content normal.        Judgment: Judgment normal.        Assessment & Plan:  1. Diabetes mellitus treated with oral medication (HCC) (Primary)  - POC HgB A1c- 6.9  - Continue with Glipizide  5 mg at bedtime - Follow up in three months or sooner if needed  2. Essential hypertension - Well controlled. No change in medication   3. ESRD (end stage renal disease) (HCC) - Per nephrology   4. Anxiety and depression - Flowsheet Row Office Visit from 03/18/2024 in [facility]  PHQ-9 Total Score 21      03/18/2024   10:43 AM 01/08/2023    8:37 AM  GAD 7 : Generalized Anxiety Score  Nervous, Anxious, on Edge 1 0  Control/stop worrying 1 0  Worry too much - different things 1  0  Trouble relaxing 1 0  Restless 1 0  Easily annoyed or irritable 3 0  Afraid - awful might happen 3 3  Total GAD 7 Score 11 3   - We discussed replacing Wellbutrin  with something else like Lexapro but the patient refuses.  He wants to come off Wellbutrin  completely.  I discussed titration instructions with the patient's wif in order to keep  the risk of withdrawal low.  If he changes his mind they will let me know.  Kenika Sahm, NP

## 2024-03-18 NOTE — Patient Instructions (Addendum)
 Your A1c was 6.9 today   Please follow up in 3 months

## 2024-03-24 DIAGNOSIS — T8249XD Other complication of vascular dialysis catheter, subsequent encounter: Secondary | ICD-10-CM | POA: Diagnosis not present

## 2024-03-24 DIAGNOSIS — N2581 Secondary hyperparathyroidism of renal origin: Secondary | ICD-10-CM | POA: Diagnosis not present

## 2024-03-24 DIAGNOSIS — N186 End stage renal disease: Secondary | ICD-10-CM | POA: Diagnosis not present

## 2024-03-24 DIAGNOSIS — Z992 Dependence on renal dialysis: Secondary | ICD-10-CM | POA: Diagnosis not present

## 2024-03-24 DIAGNOSIS — D689 Coagulation defect, unspecified: Secondary | ICD-10-CM | POA: Diagnosis not present

## 2024-03-24 NOTE — Progress Notes (Signed)
 Everything fit fine when we re-fit   Patient will call if any other issues arise  Lolita

## 2024-03-26 ENCOUNTER — Other Ambulatory Visit

## 2024-03-26 ENCOUNTER — Other Ambulatory Visit: Payer: Self-pay

## 2024-03-26 DIAGNOSIS — N186 End stage renal disease: Secondary | ICD-10-CM

## 2024-03-31 DIAGNOSIS — N186 End stage renal disease: Secondary | ICD-10-CM | POA: Diagnosis not present

## 2024-03-31 DIAGNOSIS — N2581 Secondary hyperparathyroidism of renal origin: Secondary | ICD-10-CM | POA: Diagnosis not present

## 2024-03-31 DIAGNOSIS — Z992 Dependence on renal dialysis: Secondary | ICD-10-CM | POA: Diagnosis not present

## 2024-03-31 DIAGNOSIS — E875 Hyperkalemia: Secondary | ICD-10-CM | POA: Diagnosis not present

## 2024-03-31 DIAGNOSIS — D689 Coagulation defect, unspecified: Secondary | ICD-10-CM | POA: Diagnosis not present

## 2024-03-31 DIAGNOSIS — T8249XD Other complication of vascular dialysis catheter, subsequent encounter: Secondary | ICD-10-CM | POA: Diagnosis not present

## 2024-04-02 DIAGNOSIS — N186 End stage renal disease: Secondary | ICD-10-CM | POA: Diagnosis not present

## 2024-04-02 DIAGNOSIS — Z992 Dependence on renal dialysis: Secondary | ICD-10-CM | POA: Diagnosis not present

## 2024-04-02 DIAGNOSIS — E1122 Type 2 diabetes mellitus with diabetic chronic kidney disease: Secondary | ICD-10-CM | POA: Diagnosis not present

## 2024-04-03 ENCOUNTER — Telehealth: Payer: Self-pay

## 2024-04-03 DIAGNOSIS — E1140 Type 2 diabetes mellitus with diabetic neuropathy, unspecified: Secondary | ICD-10-CM

## 2024-04-03 NOTE — Progress Notes (Signed)
 04/03/2024 Name: Russell Austin MRN: [id number] DOB: 07-25-1951  Chief Complaint  Patient presents with   Medication Management   Diabetes    AWS SHERE is a 73 y.o. year old male who presented for a telephone visit.   They were referred to the pharmacist by their PCP for assistance in managing diabetes and complex medication management.    Subjective:  Care Team: Primary Care Provider: Merna Huxley, NP   Medication Access/Adherence  Current Pharmacy:  [facility] [address] [place] - [address] [place] [address] Phone: [phone] Fax: [phone]  [facility] [address] [place] [address] Phone: [phone] Fax: [phone]   Patient reports affordability concerns with their medications: No  Patient reports access/transportation concerns to their pharmacy: No  Patient reports adherence concerns with their medications:  No  Diabetes:  Current Medications: Glipizide  5mg  daily except for dialysis days (Tues/Thurs/Sat)    No longer on insulin . Reports he still has some at home but has used once in the last month when he ate a lot of ice cream.  Missing days of data, patient not changing sensor on time   Objective:  Lab Results  Component Value Date   HGBA1C 6.9 (A) 03/18/2024    Lab Results  Component Value Date   CREATININE 3.50 (H) 03/09/2024   BUN 39 (H) 03/09/2024   NA 139 03/09/2024   K 4.9 03/09/2024   CL 103 03/09/2024   CO2 29 01/26/2024    Lab Results  Component Value Date   CHOL 90 02/28/2023   HDL 26.30 (L) 02/28/2023   LDLCALC 46 02/28/2023   TRIG 88.0 02/28/2023   CHOLHDL 3 02/28/2023    Medications Reviewed Today     Reviewed by Lionell Jon DEL, RPH (Pharmacist) on 04/03/24 at 1548  Med List Status: <None>   Medication Order Taking? Sig Documenting Provider Last Dose Status Informant  albuterol  (PROVENTIL ) (2.5 MG/3ML) 0.083%  nebulizer solution [id number] No Take 3 mLs (2.5 mg total) by nebulization every 6 (six) hours as needed for wheezing or shortness of breath. Sherrill Cable [place], DO More than a month Active Self  amLODipine  (NORVASC ) 10 MG tablet [id number] No Take 1 tablet (10 mg total) by mouth daily.  Patient taking differently: Take 5 mg by mouth daily.   Vernon Ranks, MD 03/09/2024  6:15 AM Active Self           Med Note CHRISTIE, JEANNETTA   Tue Mar 03, 2024  2:52 PM) Confirmed taking 5 mg  aspirin  EC 81 MG tablet [id number] No Take 81 mg by mouth daily. [provider] 03/09/2024  6:15 AM Active Self  atorvastatin  (LIPITOR) 10 MG tablet [id number]  Take 1 tablet (10 mg total) by mouth every morning. Nafziger, Huxley, NP  Active Self  buPROPion  (WELLBUTRIN  XL) 300 MG 24 hr tablet [id number] No TAKE 1 TABLET (300 MG TOTAL) BY MOUTH EVERY MORNING. Merna Huxley, NP 03/09/2024  6:15 AM Active Self  calcitRIOL  (ROCALTROL ) 0.5 MCG capsule [id number] No Take 1 capsule (0.5 mcg total) by mouth daily. Sherrill Cable [place], [place] 03/08/2024 Active Self           Med Note CHRISTIE, JEANNETTA   Tue Mar 03, 2024  3:02 PM) unknown  chlorproMAZINE  (THORAZINE ) 25 MG tablet [id number] No TAKE 1 TABLET BY MOUTH THREE TIMES A DAY  Patient taking differently: Take 25 mg by mouth daily as needed for hiccoughs.   Nafziger, [place],  NP More than a month Active Self  Continuous Glucose Sensor (FREESTYLE LIBRE 3 PLUS SENSOR) MISC [id number]  Change sensor every 15 days.  Patient taking differently: Inject 1 Device into the skin every 14 (fourteen) days.   Nafziger, Darleene, NP  Active Self  gabapentin  (NEURONTIN ) 300 MG capsule [id number] No TAKE ONE CAPSULE BY MOUTH EVERYDAY AT BEDTIME  Patient taking differently: Take 300 mg by mouth at bedtime.   Merna Darleene, NP Past Week Active Self           Med Note ROLENE EVERN PULLING A   Dju Jan 25, 2024  3:58 PM)    gentamicin  cream (GARAMYCIN ) 0.1 % [id number] No Apply 1 Application topically  daily. [provider] More than a month Active Self  glipiZIDE  (GLUCOTROL  XL) 5 MG 24 hr tablet [id number] No Take 5 mg by mouth at bedtime. Taking daily except for dialysis days (Tues/Thurs/Sat) [provider] 03/08/2024 Active Self  lactulose (CHRONULAC) 10 GM/15ML solution [id number] No Take by mouth daily as needed for mild constipation or moderate constipation. [provider] Past Week Active Self           Med Note ROLENE EVERN PULLING A   Sat Jan 25, 2024  4:00 PM)    losartan  (COZAAR ) 100 MG tablet [id number] No TAKE 1 TABLET BY MOUTH EVERY DAY IN THE MORNING Nafziger, Cory, NP 03/08/2024 Active Self  metoCLOPramide  (REGLAN ) 10 MG tablet [id number] No TAKE 1 TABLET BY MOUTH 4 TIMES DAILY.  Patient taking differently: Take 10 mg by mouth daily as needed for nausea or vomiting.   Nafziger, Darleene, NP More than a month Active Self           Med Note STEFFI, ALEXANDRIA   Sat Jan 25, 2024  1:52 PM)    metoprolol  tartrate (LOPRESSOR ) 100 MG tablet [id number]  Take 1 tablet (100 mg total) by mouth 2 (two) times daily. Nafziger, Darleene, NP  Active   pantoprazole  (PROTONIX ) 40 MG tablet [id number] No Take 1 tablet (40 mg total) by mouth daily. Mesner, Selinda, MD 03/08/2024 Active Self  potassium chloride  SA (KLOR-CON  M) 20 MEQ tablet [id number] No Take 1 tablet (20 mEq total) by mouth daily. Merna Darleene, NP 03/08/2024 Active Self           Med Note CHRISTIE, JEANNETTA   Tue Mar 03, 2024  2:59 PM)    tiZANidine  (ZANAFLEX ) 4 MG tablet [id number]  Take 1 tablet (4 mg total) by mouth every 6 (six) hours as needed for muscle spasms. Nafziger, Darleene, NP  Active               Assessment/Plan:   Diabetes: - Currently controlled at last A1C check but sugars appear to be trending up, missing sensor data - Reviewed long term cardiovascular and renal outcomes of uncontrolled blood sugar - Reviewed goal A1c, goal fasting, and goal 2 hour post prandial glucose -Counseled on changing  sensor when it is due to be replaced. -Scheduled f/u for full med review in 3 weeks    Follow Up Plan: 3 weeks  Jon VEAR [place], PharmD Clinical Pharmacist [phone]

## 2024-04-04 DIAGNOSIS — T8249XD Other complication of vascular dialysis catheter, subsequent encounter: Secondary | ICD-10-CM | POA: Diagnosis not present

## 2024-04-04 DIAGNOSIS — Z992 Dependence on renal dialysis: Secondary | ICD-10-CM | POA: Diagnosis not present

## 2024-04-04 DIAGNOSIS — N2581 Secondary hyperparathyroidism of renal origin: Secondary | ICD-10-CM | POA: Diagnosis not present

## 2024-04-04 DIAGNOSIS — D689 Coagulation defect, unspecified: Secondary | ICD-10-CM | POA: Diagnosis not present

## 2024-04-04 DIAGNOSIS — D631 Anemia in chronic kidney disease: Secondary | ICD-10-CM | POA: Diagnosis not present

## 2024-04-04 DIAGNOSIS — N186 End stage renal disease: Secondary | ICD-10-CM | POA: Diagnosis not present

## 2024-04-07 DIAGNOSIS — N2581 Secondary hyperparathyroidism of renal origin: Secondary | ICD-10-CM | POA: Diagnosis not present

## 2024-04-07 DIAGNOSIS — D689 Coagulation defect, unspecified: Secondary | ICD-10-CM | POA: Diagnosis not present

## 2024-04-07 DIAGNOSIS — T8249XD Other complication of vascular dialysis catheter, subsequent encounter: Secondary | ICD-10-CM | POA: Diagnosis not present

## 2024-04-07 DIAGNOSIS — N186 End stage renal disease: Secondary | ICD-10-CM | POA: Diagnosis not present

## 2024-04-07 DIAGNOSIS — Z992 Dependence on renal dialysis: Secondary | ICD-10-CM | POA: Diagnosis not present

## 2024-04-09 NOTE — Progress Notes (Signed)
 POST OPERATIVE OFFICE NOTE    CC:  F/u for surgery  HPI:  This is a 73 y.o. 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 Lanis.  He has TDC that was placed on 12/30/2023 by Dr. Lanis.   Pt states he does  have pain/numbness in the right hand but this has been present since his stroke and is unchanged since fistula creation.    The pt is on dialysis T/T/S at [facility] location.  Dialysis access hx: -PD catheter placement 04/11/2023 Dr. Magda -removal PD cath (infection) 12/30/2023 Lanis -right 1st stage BVT 03/09/2024 Dr. Lanis   Allergies  Allergen Reactions   Lactose Intolerance (Gi) Diarrhea   Peach [Prunus Persica] Itching and Other (See Comments)    ITCHY THROAT   Apple Pectin [Pectin] Itching and Other (See Comments)    ITCHY THROAT   Metformin  And Related Other (See Comments)    D/t decreased kidney function    Morphine  And Codeine Anxiety    Current Outpatient Medications  Medication Sig Dispense Refill   albuterol  (PROVENTIL ) (2.5 MG/3ML) 0.083% nebulizer solution Take 3 mLs (2.5 mg total) by nebulization every 6 (six) hours as needed for wheezing or shortness of breath. 75 mL 12   amLODipine  (NORVASC ) 10 MG tablet Take 1 tablet (10 mg total) by mouth daily. (Patient taking differently: Take 5 mg by mouth daily.) 30 tablet 0   aspirin  EC 81 MG tablet Take 81 mg by mouth daily.     [Paused] atorvastatin  (LIPITOR) 10 MG tablet Take 1 tablet (10 mg total) by mouth every morning. 90 tablet 3   buPROPion  (WELLBUTRIN  XL) 300 MG 24 hr tablet TAKE 1 TABLET (300 MG TOTAL) BY MOUTH EVERY MORNING. 90 tablet 1   calcitRIOL  (ROCALTROL ) 0.5 MCG capsule Take 1 capsule (0.5 mcg total) by mouth daily.     chlorproMAZINE  (THORAZINE ) 25 MG tablet TAKE 1 TABLET BY MOUTH THREE TIMES A DAY (Patient taking differently: Take 25 mg by mouth daily as needed for hiccoughs.) 270 tablet 1   Continuous Glucose Sensor (FREESTYLE LIBRE 3 PLUS SENSOR) MISC Change sensor every  15 days. (Patient taking differently: Inject 1 Device into the skin every 14 (fourteen) days.) 6 each 3   gabapentin  (NEURONTIN ) 300 MG capsule TAKE ONE CAPSULE BY MOUTH EVERYDAY AT BEDTIME (Patient taking differently: Take 300 mg by mouth at bedtime.) 90 capsule 3   gentamicin  cream (GARAMYCIN ) 0.1 % Apply 1 Application topically daily.     glipiZIDE  (GLUCOTROL  XL) 5 MG 24 hr tablet Take 5 mg by mouth at bedtime. Taking daily except for dialysis days (Tues/Thurs/Sat)     lactulose (CHRONULAC) 10 GM/15ML solution Take by mouth daily as needed for mild constipation or moderate constipation.     losartan  (COZAAR ) 100 MG tablet TAKE 1 TABLET BY MOUTH EVERY DAY IN THE MORNING 90 tablet 1   metoCLOPramide  (REGLAN ) 10 MG tablet TAKE 1 TABLET BY MOUTH 4 TIMES DAILY. (Patient taking differently: Take 10 mg by mouth daily as needed for nausea or vomiting.) 120 tablet 0   metoprolol  tartrate (LOPRESSOR ) 100 MG tablet Take 1 tablet (100 mg total) by mouth 2 (two) times daily. 180 tablet 3   pantoprazole  (PROTONIX ) 40 MG tablet Take 1 tablet (40 mg total) by mouth daily. 90 tablet 0   potassium chloride  SA (KLOR-CON  M) 20 MEQ tablet Take 1 tablet (20 mEq total) by mouth daily. 30 tablet 3   tiZANidine  (ZANAFLEX ) 4 MG tablet  Take 1 tablet (4 mg total) by mouth every 6 (six) hours as needed for muscle spasms. 90 tablet 1   No current facility-administered medications for this visit.     ROS:  See HPI  Physical Exam:  Today's Vitals   04/13/24 0938  BP: 119/61  Pulse: 63  Temp: 97.8 F (36.6 C)  TempSrc: Temporal  Weight: 155 lb 3.2 oz (70.4 kg)   Body mass index is 22.92 kg/m.   Incision:  well healed Extremities:   There is a palpable right radial pulse.   Motor and sensory are in tact.   There is a thrill/bruit present.  Access is  easily palpable   Dialysis Duplex on 04/13/2024: Findings:  +--------------------+----------+-----------------+--------+  AVF                PSV  (cm/s)Flow Vol (mL/min)Comments  +--------------------+----------+-----------------+--------+  Native artery inflow   277          2534                 +--------------------+----------+-----------------+--------+  AVF Anastomosis        898                               +--------------------+----------+-----------------+--------+     +------------+----------+-------------+----------+--------+  OUTFLOW VEINPSV (cm/s)Diameter (cm)Depth (cm)Describe  +------------+----------+-------------+----------+--------+  Prox UA        367        0.58        0.59             +------------+----------+-------------+----------+--------+  Mid UA         264        0.83        0.66             +------------+----------+-------------+----------+--------+  Dist UA        125        0.77        0.69             +------------+----------+-------------+----------+--------+   Summary:  Patent right brachial-basilic AVF. There is no evidence of thrombus or  stenosis within the AVF. Elevated velocity is seen at the anastomosis.       Assessment/Plan:  This is a 73 y.o. 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 Lanis.   -the pt does not have evidence of steal.  He does have pain in the right hand, however this is unchanged from his stroke and has not changed since fistula creation.  -fistula has excellent thrill and 2.5L flow volume and maturing.  Will schedule him for 2nd stage BVT with Dr. Lanis on pt non dialysis day.  -if pt has tunneled catheter, this can be removed at the discretion of the dialysis center once the pt's access has been successfully cannulated to their satisfaction.  -discussed with pt that access does not last forever and will need intervention or even new access at some point.    Lucie Apt, [facility] [phone]  Clinic MD:  Serene

## 2024-04-13 ENCOUNTER — Ambulatory Visit (HOSPITAL_COMMUNITY)
Admission: RE | Admit: 2024-04-13 | Discharge: 2024-04-13 | Disposition: A | Discharge status: Home / Self Care | Source: Ambulatory Visit | Attending: Surgery | Admitting: Surgery

## 2024-04-13 ENCOUNTER — Ambulatory Visit: Attending: Surgery | Admitting: Physician Assistant

## 2024-04-13 ENCOUNTER — Encounter: Payer: Self-pay | Admitting: *Deleted

## 2024-04-13 ENCOUNTER — Other Ambulatory Visit: Payer: Self-pay | Admitting: *Deleted

## 2024-04-13 VITALS — BP 119/61 | HR 63 | Temp 97.8°F | Wt 155.2 lb

## 2024-04-13 DIAGNOSIS — N186 End stage renal disease: Secondary | ICD-10-CM | POA: Insufficient documentation

## 2024-04-13 DIAGNOSIS — Z992 Dependence on renal dialysis: Secondary | ICD-10-CM

## 2024-04-14 DIAGNOSIS — Z992 Dependence on renal dialysis: Secondary | ICD-10-CM | POA: Diagnosis not present

## 2024-04-14 DIAGNOSIS — D689 Coagulation defect, unspecified: Secondary | ICD-10-CM | POA: Diagnosis not present

## 2024-04-14 DIAGNOSIS — N186 End stage renal disease: Secondary | ICD-10-CM | POA: Diagnosis not present

## 2024-04-14 DIAGNOSIS — T8249XD Other complication of vascular dialysis catheter, subsequent encounter: Secondary | ICD-10-CM | POA: Diagnosis not present

## 2024-04-14 DIAGNOSIS — N2581 Secondary hyperparathyroidism of renal origin: Secondary | ICD-10-CM | POA: Diagnosis not present

## 2024-04-21 DIAGNOSIS — E875 Hyperkalemia: Secondary | ICD-10-CM | POA: Diagnosis not present

## 2024-04-21 DIAGNOSIS — T8249XD Other complication of vascular dialysis catheter, subsequent encounter: Secondary | ICD-10-CM | POA: Diagnosis not present

## 2024-04-21 DIAGNOSIS — N2581 Secondary hyperparathyroidism of renal origin: Secondary | ICD-10-CM | POA: Diagnosis not present

## 2024-04-21 DIAGNOSIS — D689 Coagulation defect, unspecified: Secondary | ICD-10-CM | POA: Diagnosis not present

## 2024-04-21 DIAGNOSIS — D631 Anemia in chronic kidney disease: Secondary | ICD-10-CM | POA: Diagnosis not present

## 2024-04-21 DIAGNOSIS — Z992 Dependence on renal dialysis: Secondary | ICD-10-CM | POA: Diagnosis not present

## 2024-04-21 DIAGNOSIS — N186 End stage renal disease: Secondary | ICD-10-CM | POA: Diagnosis not present

## 2024-04-25 ENCOUNTER — Other Ambulatory Visit: Payer: Self-pay | Admitting: Adult Health

## 2024-04-27 DIAGNOSIS — Z860100 Personal history of colon polyps, unspecified: Secondary | ICD-10-CM | POA: Diagnosis not present

## 2024-04-27 DIAGNOSIS — Z1211 Encounter for screening for malignant neoplasm of colon: Secondary | ICD-10-CM | POA: Diagnosis not present

## 2024-04-27 DIAGNOSIS — E119 Type 2 diabetes mellitus without complications: Secondary | ICD-10-CM | POA: Diagnosis not present

## 2024-04-27 DIAGNOSIS — K635 Polyp of colon: Secondary | ICD-10-CM | POA: Diagnosis not present

## 2024-04-28 DIAGNOSIS — E875 Hyperkalemia: Secondary | ICD-10-CM | POA: Diagnosis not present

## 2024-04-28 DIAGNOSIS — D689 Coagulation defect, unspecified: Secondary | ICD-10-CM | POA: Diagnosis not present

## 2024-04-28 DIAGNOSIS — Z992 Dependence on renal dialysis: Secondary | ICD-10-CM | POA: Diagnosis not present

## 2024-04-28 DIAGNOSIS — T8249XD Other complication of vascular dialysis catheter, subsequent encounter: Secondary | ICD-10-CM | POA: Diagnosis not present

## 2024-04-28 DIAGNOSIS — N186 End stage renal disease: Secondary | ICD-10-CM | POA: Diagnosis not present

## 2024-04-28 DIAGNOSIS — N2581 Secondary hyperparathyroidism of renal origin: Secondary | ICD-10-CM | POA: Diagnosis not present

## 2024-05-03 DIAGNOSIS — Z992 Dependence on renal dialysis: Secondary | ICD-10-CM | POA: Diagnosis not present

## 2024-05-03 DIAGNOSIS — E1122 Type 2 diabetes mellitus with diabetic chronic kidney disease: Secondary | ICD-10-CM | POA: Diagnosis not present

## 2024-05-03 DIAGNOSIS — N186 End stage renal disease: Secondary | ICD-10-CM | POA: Diagnosis not present

## 2024-05-05 DIAGNOSIS — D689 Coagulation defect, unspecified: Secondary | ICD-10-CM | POA: Diagnosis not present

## 2024-05-05 DIAGNOSIS — N186 End stage renal disease: Secondary | ICD-10-CM | POA: Diagnosis not present

## 2024-05-05 DIAGNOSIS — N2581 Secondary hyperparathyroidism of renal origin: Secondary | ICD-10-CM | POA: Diagnosis not present

## 2024-05-05 DIAGNOSIS — E875 Hyperkalemia: Secondary | ICD-10-CM | POA: Diagnosis not present

## 2024-05-05 DIAGNOSIS — T8249XD Other complication of vascular dialysis catheter, subsequent encounter: Secondary | ICD-10-CM | POA: Diagnosis not present

## 2024-05-05 DIAGNOSIS — Z992 Dependence on renal dialysis: Secondary | ICD-10-CM | POA: Diagnosis not present

## 2024-05-07 ENCOUNTER — Encounter (HOSPITAL_COMMUNITY): Payer: Self-pay | Admitting: Vascular Surgery

## 2024-05-07 ENCOUNTER — Other Ambulatory Visit: Payer: Self-pay

## 2024-05-07 NOTE — Progress Notes (Signed)
 SDW CALL  Patient was given pre-op  instructions over the phone. The opportunity was given for the patient to ask questions. No further questions asked. Patient verbalized understanding of instructions given.   PCP - Darleene Merna PIETY Cardiologist - denies  PPM/ICD - denies Device Orders -  Rep Notified -   Chest x-ray - 06/01/23 EKG - 01/25/24 Stress Test - denies ECHO - 02/11/15 Cardiac Cath - deneis  Sleep Study - denies CPAP -   Fasting Blood Sugar - 130's Checks Blood Sugar has a free style libre  Blood Thinner Instructions:na Aspirin  Instructions:continue continue  ERAS Protcol -no PRE-SURGERY Ensure or G2-   COVID TEST- na   Anesthesia review: yes - pt hx HTN,DM2 ,ESRD ,stroke. See below about runny nose and cough. Denied fever, sore throat , body aches . Says he feels better now.   Patient denies shortness of breath, fever, and chest pain over the phone call. Pt stated he had a runny nose and cough last week but says he is better this week . Thinks it was his allergies.    Surgical Instructions  Special instructions:    Oral Hygiene is also important to reduce your risk of infection.  Remember - BRUSH YOUR TEETH THE MORNING OF SURGERY WITH YOUR REGULAR TOOTHPASTE

## 2024-05-07 NOTE — Anesthesia Preprocedure Evaluation (Signed)
 Anesthesia Evaluation  Patient identified by MRN, date of birth, ID band Patient awake    Reviewed: Allergy & Precautions, NPO status , Patient's Chart, lab work & pertinent test results  Airway Mallampati: II  TM Distance: >3 FB Neck ROM: Full    Dental  (+) Teeth Intact, Dental Advisory Given   Pulmonary asthma    breath sounds clear to auscultation       Cardiovascular hypertension, Pt. on medications and Pt. on home beta blockers  Rhythm:Regular Rate:Normal     Neuro/Psych  Neuromuscular disease CVA  negative psych ROS   GI/Hepatic Neg liver ROS,GERD  Medicated,,  Endo/Other  diabetes, Type 2, Oral Hypoglycemic Agents, Insulin  Dependent    Renal/GU Renal disease     Musculoskeletal  (+) Arthritis ,    Abdominal   Peds  Hematology  (+) Blood dyscrasia, anemia   Anesthesia Other Findings   Reproductive/Obstetrics                              Anesthesia Physical Anesthesia Plan  ASA: 2  Anesthesia Plan: General   Post-op Pain Management: Tylenol  PO (pre-op )*   Induction: Intravenous  PONV Risk Score and Plan: 2 and Ondansetron  and Treatment may vary due to age or medical condition  Airway Management Planned: LMA  Additional Equipment: None  Intra-op Plan:   Post-operative Plan: Extubation in OR  Informed Consent: I have reviewed the patients History and Physical, chart, labs and discussed the procedure including the risks, benefits and alternatives for the proposed anesthesia with the patient or authorized representative who has indicated his/her understanding and acceptance.     Dental advisory given  Plan Discussed with: CRNA  Anesthesia Plan Comments: (PAT note written 05/07/2024 by Khori Rosevear, PA-C.  Pt consented for PNB if needed in PACU.  )         Anesthesia Quick Evaluation

## 2024-05-07 NOTE — Progress Notes (Signed)
 error

## 2024-05-07 NOTE — Progress Notes (Signed)
 Anesthesia Chart Review: Russell Austin  Case: [id number] Date/Time: 05/08/24 0915   Procedure: TRANSPOSITION, VEIN, BASILIC (Right)   Anesthesia type: Choice   Diagnosis: End stage renal disease (HCC) [N18.6]   Pre-op  diagnosis: ESRD   [facility]   Surgeons: Lanis Fonda BRAVO, MD       DISCUSSION: Patient is a 73 year old male scheduled for the above procedure. S/p 1st stage BVT 03/09/2024. Left internal jugular TDC 12/30/2023.  History includes never smoker, HTN, DM2 (retinopathy, nephropathy), CVA, ESRD (PD cath 04/11/2023-12/30/2023 due to infection; HD TTS at [facility] location), glaucoma  Anesthesia team to evaluate on the day of surgery.  VS:  Wt Readings from Last 3 Encounters:  04/13/24 70.4 kg  03/18/24 59.4 kg  03/09/24 74.8 kg   BP Readings from Last 3 Encounters:  04/13/24 119/61  03/18/24 120/60  03/09/24 124/64   Pulse Readings from Last 3 Encounters:  04/13/24 63  03/18/24 82  03/09/24 62     PROVIDERS: Merna Huxley, NP is PCP   LABS: For day of surgery. As of 03/09/2024, H/H 08/11/37.0 (by iSTAT).   EKG: 01/25/2024: Sinus rhythm Borderline right axis deviation Confirmed by Dasie Faden ([id number]) on 01/25/2024 12:24:25 PM  CV: Echo 02/11/2015: Study Conclusions  - Left ventricle: The cavity size was normal. Wall thickness was    normal. The estimated ejection fraction was 60%. Wall motion was    normal; there were no regional wall motion abnormalities.  - Right ventricle: The cavity size was normal. Systolic function    was normal.  - Impressions: No cardiac source of embolism was identified, but    cannot be ruled out on the basis of this examination.   Impressions:  - No cardiac source of embolism was identified, but cannot be ruled    out on the basis of this examination.    US  Carotid 02/11/2015: Summary:  Findings suggest 1-39% internal carotid artery stenosis  bilaterally. Vertebral arteries are patent with  antegrade flow.    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)    Chronic kidney disease    Diabetes mellitus type II    Diabetic retinopathy FOLLOWED BY DR ELNER   ED (erectile dysfunction)    Glaucoma of both eyes    Hypertension    Hypertensive urgency 12/27/2023   Left hydrocele    Stroke [facility])     Past Surgical History:  Procedure Laterality Date   APPENDECTOMY  AGE EARLY 20'S   AV FISTULA PLACEMENT Right 03/09/2024   Procedure: ARTERIOVENOUS (AV) FISTULA CREATION;  Surgeon: Lanis Fonda BRAVO, MD;  Location: [facility] OR;  Service: Vascular;  Laterality: Right;   CAPD INSERTION N/A 04/11/2023   Procedure: LAPAROSCOPIC INSERTION CONTINUOUS AMBULATORY PERITONEAL DIALYSIS  (CAPD) CATHETER WITH  OMENTOPEXY;  Surgeon: Magda Debby SAILOR, MD;  Location: [facility] OR;  Service: Vascular;  Laterality: N/A;   CAPD REMOVAL N/A 12/30/2023   Procedure: CONTINUOUS AMBULATORY PERITONEAL DIALYSIS  (CAPD) CATHETER REMOVAL;  Surgeon: Lanis Fonda BRAVO, MD;  Location: [facility] OR;  Service: Vascular;  Laterality: N/A;   COLONOSCOPY  12/30/2020   every 5 years   HYDROCELE EXCISION  03/31/2012   Procedure: HYDROCELECTOMY ADULT;  Surgeon: Garnette Shack, MD;  Location: [facility];  Service: Urology;  Laterality: Left;  45 MINS     INSERTION OF DIALYSIS  CATHETER N/A 12/30/2023   Procedure: EXCHANGE OF DIALYSIS CATHETER;  Surgeon: Lanis Fonda BRAVO, MD;  Location: [facility] OR;  Service: Vascular;  Laterality: N/A;   IR FLUORO GUIDE CV LINE LEFT  12/27/2023   IR US  GUIDE VASC ACCESS LEFT  12/27/2023   LEFT EYE LASER RETINA REPAIR  SEPT 2012   RIGHT EYE VITRECTOMY/ INSERTION GLAUCOMA SETON/ LASER REPAIR  12-13-2008   RETINAL ARTERY OCCLUSION /NEOVASCULAR GLAUCOMA/ HEMORRHAGE   RIGHT EYE VITRETOMY/ INSERTION GLAUCOMA SETON X2/ LASER  03-24-2009   RECURRENT HEMORRHAGE/ OCCLUSION INTERNAL  SETON   SHOULDER ARTHROSCOPY Right 2005   undescended right testicle removed  1994    MEDICATIONS: No current facility-administered medications for this encounter.    amLODipine  (NORVASC ) 10 MG tablet   aspirin  EC 81 MG tablet   [Paused] atorvastatin  (LIPITOR) 10 MG tablet   buPROPion  (WELLBUTRIN  XL) 300 MG 24 hr tablet   chlorproMAZINE  (THORAZINE ) 25 MG tablet   chlorthalidone (HYGROTON) 25 MG tablet   gabapentin  (NEURONTIN ) 300 MG capsule   glipiZIDE  (GLUCOTROL  XL) 5 MG 24 hr tablet   insulin  glargine (LANTUS ) 100 UNIT/ML injection   losartan  (COZAAR ) 100 MG tablet   metoCLOPramide  (REGLAN ) 10 MG tablet   metoprolol  tartrate (LOPRESSOR ) 100 MG tablet   tiZANidine  (ZANAFLEX ) 4 MG tablet   torsemide  (DEMADEX ) 20 MG tablet   albuterol  (PROVENTIL ) (2.5 MG/3ML) 0.083% nebulizer solution   calcitRIOL  (ROCALTROL ) 0.5 MCG capsule   Continuous Glucose Sensor (FREESTYLE LIBRE 3 PLUS SENSOR) MISC   gentamicin  cream (GARAMYCIN ) 0.1 %   pantoprazole  (PROTONIX ) 40 MG tablet   potassium chloride  SA (KLOR-CON  M) 20 MEQ tablet   Isaiah Ruder, PA-C Surgical Short Stay/Anesthesiology [facility] Phone [phone] [facility] Phone [phone] 05/07/2024 2:37 PM

## 2024-05-08 ENCOUNTER — Encounter (HOSPITAL_COMMUNITY)
Admission: RE | Disposition: A | Discharge status: Home / Self Care | Payer: Self-pay | Source: Home / Self Care | Attending: Vascular Surgery

## 2024-05-08 ENCOUNTER — Other Ambulatory Visit: Payer: Self-pay

## 2024-05-08 ENCOUNTER — Encounter (HOSPITAL_COMMUNITY): Payer: Self-pay | Admitting: Vascular Surgery

## 2024-05-08 ENCOUNTER — Other Ambulatory Visit (HOSPITAL_COMMUNITY): Payer: Self-pay

## 2024-05-08 ENCOUNTER — Ambulatory Visit (HOSPITAL_BASED_OUTPATIENT_CLINIC_OR_DEPARTMENT_OTHER): Payer: Self-pay | Admitting: Vascular Surgery

## 2024-05-08 ENCOUNTER — Ambulatory Visit (HOSPITAL_COMMUNITY)
Admission: RE | Admit: 2024-05-08 | Discharge: 2024-05-08 | Disposition: A | Discharge status: Home / Self Care | Attending: Vascular Surgery | Admitting: Vascular Surgery

## 2024-05-08 DIAGNOSIS — E11319 Type 2 diabetes mellitus with unspecified diabetic retinopathy without macular edema: Secondary | ICD-10-CM | POA: Diagnosis not present

## 2024-05-08 DIAGNOSIS — N186 End stage renal disease: Secondary | ICD-10-CM | POA: Insufficient documentation

## 2024-05-08 DIAGNOSIS — R200 Anesthesia of skin: Secondary | ICD-10-CM | POA: Insufficient documentation

## 2024-05-08 DIAGNOSIS — Z992 Dependence on renal dialysis: Secondary | ICD-10-CM | POA: Diagnosis not present

## 2024-05-08 DIAGNOSIS — Z7984 Long term (current) use of oral hypoglycemic drugs: Secondary | ICD-10-CM | POA: Diagnosis not present

## 2024-05-08 DIAGNOSIS — T82590A Other mechanical complication of surgically created arteriovenous fistula, initial encounter: Secondary | ICD-10-CM | POA: Diagnosis not present

## 2024-05-08 DIAGNOSIS — K219 Gastro-esophageal reflux disease without esophagitis: Secondary | ICD-10-CM | POA: Diagnosis not present

## 2024-05-08 DIAGNOSIS — I69398 Other sequelae of cerebral infarction: Secondary | ICD-10-CM | POA: Insufficient documentation

## 2024-05-08 DIAGNOSIS — E1122 Type 2 diabetes mellitus with diabetic chronic kidney disease: Secondary | ICD-10-CM | POA: Diagnosis not present

## 2024-05-08 DIAGNOSIS — I120 Hypertensive chronic kidney disease with stage 5 chronic kidney disease or end stage renal disease: Secondary | ICD-10-CM

## 2024-05-08 DIAGNOSIS — Z79899 Other long term (current) drug therapy: Secondary | ICD-10-CM | POA: Insufficient documentation

## 2024-05-08 DIAGNOSIS — E785 Hyperlipidemia, unspecified: Secondary | ICD-10-CM | POA: Diagnosis not present

## 2024-05-08 DIAGNOSIS — Z794 Long term (current) use of insulin: Secondary | ICD-10-CM | POA: Diagnosis not present

## 2024-05-08 DIAGNOSIS — J45909 Unspecified asthma, uncomplicated: Secondary | ICD-10-CM | POA: Diagnosis not present

## 2024-05-08 HISTORY — PX: BASCILIC VEIN TRANSPOSITION: SHX5742

## 2024-05-08 LAB — POCT I-STAT, CHEM 8
BUN: 39 mg/dL — ABNORMAL HIGH (ref 8–23)
Calcium, Ion: 1.02 mmol/L — ABNORMAL LOW (ref 1.15–1.40)
Chloride: 98 mmol/L (ref 98–111)
Creatinine, Ser: 4.3 mg/dL — ABNORMAL HIGH (ref 0.61–1.24)
Glucose, Bld: 192 mg/dL — ABNORMAL HIGH (ref 70–99)
HCT: 39 % (ref 39.0–52.0)
Hemoglobin: 13.3 g/dL (ref 13.0–17.0)
Potassium: 3.8 mmol/L (ref 3.5–5.1)
Sodium: 138 mmol/L (ref 135–145)
TCO2: 27 mmol/L (ref 22–32)

## 2024-05-08 LAB — GLUCOSE, CAPILLARY
Glucose-Capillary: 196 mg/dL — ABNORMAL HIGH (ref 70–99)
Glucose-Capillary: 113 mg/dL — ABNORMAL HIGH (ref 70–99)

## 2024-05-08 SURGERY — TRANSPOSITION, VEIN, BASILIC
Anesthesia: General | Site: Arm Upper | Laterality: Right

## 2024-05-08 MED ORDER — HEPARIN SODIUM (PORCINE) 1000 UNIT/ML IJ SOLN
INTRAMUSCULAR | Status: AC
Start: 2024-05-08 — End: 2024-05-08
  Filled 2024-05-08: qty 20

## 2024-05-08 MED ORDER — PROPOFOL 10 MG/ML IV BOLUS
INTRAVENOUS | Status: AC
  Filled 2024-05-08: qty 20

## 2024-05-08 MED ORDER — FENTANYL CITRATE (PF) 250 MCG/5ML IJ SOLN
INTRAMUSCULAR | Status: AC
  Filled 2024-05-08: qty 5

## 2024-05-08 MED ORDER — HEPARIN SODIUM (PORCINE) 1000 UNIT/ML IJ SOLN
INTRAMUSCULAR | Status: DC | PRN
  Administered 2024-05-08: 2000 [IU] via INTRAVENOUS

## 2024-05-08 MED ORDER — LIDOCAINE-EPINEPHRINE (PF) 1 %-1:200000 IJ SOLN
INTRAMUSCULAR | Status: AC
  Filled 2024-05-08: qty 30

## 2024-05-08 MED ORDER — LACTATED RINGERS IV SOLN: INTRAVENOUS | Status: DC | PRN

## 2024-05-08 MED ORDER — ORAL CARE MOUTH RINSE: 15.0000 mL | Freq: Once | OROMUCOSAL | Status: AC

## 2024-05-08 MED ORDER — FENTANYL CITRATE (PF) 250 MCG/5ML IJ SOLN
INTRAMUSCULAR | Status: DC | PRN
  Administered 2024-05-08 (×2): 50 ug via INTRAVENOUS

## 2024-05-08 MED ORDER — 0.9 % SODIUM CHLORIDE (POUR BTL) OPTIME
TOPICAL | Status: DC | PRN
  Administered 2024-05-08: 1000 mL

## 2024-05-08 MED ORDER — ROCURONIUM BROMIDE 10 MG/ML (PF) SYRINGE
PREFILLED_SYRINGE | INTRAVENOUS | Status: DC | PRN
  Administered 2024-05-08: 50 mg via INTRAVENOUS

## 2024-05-08 MED ORDER — LIDOCAINE 2% (20 MG/ML) 5 ML SYRINGE
INTRAMUSCULAR | Status: DC | PRN
  Administered 2024-05-08: 60 mg via INTRAVENOUS

## 2024-05-08 MED ORDER — FENTANYL CITRATE (PF) 100 MCG/2ML IJ SOLN: 25.0000 ug | INTRAMUSCULAR | Status: DC | PRN

## 2024-05-08 MED ORDER — LIDOCAINE HCL (PF) 1 % IJ SOLN
INTRAMUSCULAR | Status: AC
  Filled 2024-05-08: qty 30

## 2024-05-08 MED ORDER — CHLORHEXIDINE GLUCONATE 4 % EX SOLN: 60.0000 mL | Freq: Once | CUTANEOUS | Status: DC

## 2024-05-08 MED ORDER — CEFAZOLIN SODIUM-DEXTROSE 2-4 GM/100ML-% IV SOLN
2.0000 g | INTRAVENOUS | Status: AC
  Administered 2024-05-08: 2 g via INTRAVENOUS
  Filled 2024-05-08: qty 100

## 2024-05-08 MED ORDER — MIDAZOLAM HCL 2 MG/2ML IJ SOLN
INTRAMUSCULAR | Status: AC
  Filled 2024-05-08: qty 2

## 2024-05-08 MED ORDER — PROTAMINE SULFATE 10 MG/ML IV SOLN
INTRAVENOUS | Status: DC | PRN
Start: 2024-05-08 — End: 2024-05-08
  Administered 2024-05-08: 20 mg via INTRAVENOUS

## 2024-05-08 MED ORDER — HEPARIN 6000 UNIT IRRIGATION SOLUTION
Status: AC
  Filled 2024-05-08: qty 500

## 2024-05-08 MED ORDER — OXYCODONE HCL 5 MG PO TABS
5.0000 mg | ORAL_TABLET | Freq: Four times a day (QID) | ORAL | 0 refills | Status: DC | PRN
  Filled 2024-05-08: qty 12, 3d supply, fill #0

## 2024-05-08 MED ORDER — SODIUM CHLORIDE 0.9 % IV SOLN: INTRAVENOUS | Status: DC

## 2024-05-08 MED ORDER — PHENYLEPHRINE 80 MCG/ML (10ML) SYRINGE FOR IV PUSH (FOR BLOOD PRESSURE SUPPORT)
PREFILLED_SYRINGE | INTRAVENOUS | Status: DC | PRN
  Administered 2024-05-08: 160 ug via INTRAVENOUS
  Administered 2024-05-08: 80 ug via INTRAVENOUS

## 2024-05-08 MED ORDER — PHENYLEPHRINE HCL-NACL 20-0.9 MG/250ML-% IV SOLN
INTRAVENOUS | Status: DC | PRN
  Administered 2024-05-08: 35 ug/min via INTRAVENOUS

## 2024-05-08 MED ORDER — HEPARIN 6000 UNIT IRRIGATION SOLUTION
Status: DC | PRN
  Administered 2024-05-08: 1

## 2024-05-08 MED ORDER — ONDANSETRON HCL 4 MG/2ML IJ SOLN
INTRAMUSCULAR | Status: DC | PRN
  Administered 2024-05-08: 4 mg via INTRAVENOUS

## 2024-05-08 MED ORDER — DEXAMETHASONE SODIUM PHOSPHATE 10 MG/ML IJ SOLN
INTRAMUSCULAR | Status: DC | PRN
  Administered 2024-05-08: 5 mg via INTRAVENOUS

## 2024-05-08 MED ORDER — FENTANYL CITRATE (PF) 100 MCG/2ML IJ SOLN
INTRAMUSCULAR | Status: AC
  Filled 2024-05-08: qty 2

## 2024-05-08 MED ORDER — SODIUM CHLORIDE 0.9 % IV SOLN: INTRAVENOUS | Status: DC | PRN

## 2024-05-08 MED ORDER — SUGAMMADEX SODIUM 200 MG/2ML IV SOLN
INTRAVENOUS | Status: DC | PRN
  Administered 2024-05-08: 200 mg via INTRAVENOUS

## 2024-05-08 MED ORDER — CHLORHEXIDINE GLUCONATE 0.12 % MT SOLN
15.0000 mL | Freq: Once | OROMUCOSAL | Status: AC
  Administered 2024-05-08: 15 mL via OROMUCOSAL
  Filled 2024-05-08: qty 15

## 2024-05-08 MED ORDER — INSULIN ASPART 100 UNIT/ML IJ SOLN
0.0000 [IU] | INTRAMUSCULAR | Status: DC | PRN
  Administered 2024-05-08: 2 [IU] via SUBCUTANEOUS
  Filled 2024-05-08: qty 1

## 2024-05-08 MED ORDER — PROPOFOL 10 MG/ML IV BOLUS
INTRAVENOUS | Status: DC | PRN
  Administered 2024-05-08: 100 mg via INTRAVENOUS
  Administered 2024-05-08: 40 mg via INTRAVENOUS

## 2024-05-08 SURGICAL SUPPLY — 26 items
ARMBAND PINK RESTRICT EXTREMIT (MISCELLANEOUS) ×1 IMPLANT
BAG COUNTER SPONGE SURGICOUNT (BAG) ×1 IMPLANT
CANISTER SUCTION 3000ML PPV (SUCTIONS) ×1 IMPLANT
CLIP TI MEDIUM 24 (CLIP) ×1 IMPLANT
CLIP TI WIDE RED SMALL 24 (CLIP) ×1 IMPLANT
COVER PROBE W GEL 5X96 (DRAPES) ×1 IMPLANT
DERMABOND ADVANCED .7 DNX12 (GAUZE/BANDAGES/DRESSINGS) ×1 IMPLANT
ELECTRODE REM PT RTRN 9FT ADLT (ELECTROSURGICAL) ×1 IMPLANT
GLOVE BIOGEL PI IND STRL 8 (GLOVE) ×1 IMPLANT
GOWN STRL REUS W/ TWL LRG LVL3 (GOWN DISPOSABLE) ×2 IMPLANT
GOWN STRL REUS W/TWL 2XL LVL3 (GOWN DISPOSABLE) ×2 IMPLANT
KIT BASIN OR (CUSTOM PROCEDURE TRAY) ×1 IMPLANT
KIT TURNOVER KIT B (KITS) ×1 IMPLANT
NDL HYPO 25GX1X1/2 BEV (NEEDLE) ×1 IMPLANT
NEEDLE HYPO 25GX1X1/2 BEV (NEEDLE) ×1 IMPLANT
NS IRRIG 1000ML POUR BTL (IV SOLUTION) ×1 IMPLANT
PACK CV ACCESS (CUSTOM PROCEDURE TRAY) ×1 IMPLANT
PAD ARMBOARD POSITIONER FOAM (MISCELLANEOUS) ×2 IMPLANT
SLING ARM FOAM STRAP LRG (SOFTGOODS) IMPLANT
SUT MNCRL AB 4-0 PS2 18 (SUTURE) ×1 IMPLANT
SUT PROLENE 6 0 BV (SUTURE) ×1 IMPLANT
SUT SILK 3-0 18XBRD TIE 12 (SUTURE) IMPLANT
SUT VIC AB 3-0 SH 27X BRD (SUTURE) ×2 IMPLANT
TOWEL GREEN STERILE (TOWEL DISPOSABLE) ×1 IMPLANT
UNDERPAD 30X36 HEAVY ABSORB (UNDERPADS AND DIAPERS) ×1 IMPLANT
WATER STERILE IRR 1000ML POUR (IV SOLUTION) ×1 IMPLANT

## 2024-05-08 NOTE — Op Note (Signed)
    NAME: Russell Austin    MRN: [id number] DOB: 01/15/1951    DATE OF OPERATION: 05/08/2024  PREOP DIAGNOSIS:    End stage renal disease  POSTOP DIAGNOSIS:    Same  PROCEDURE:    Right arm brachiobasilic vein transposition  SURGEON: Fonda FORBES Rim  ASSIST: Sherrilee Holster, PA  ANESTHESIA: General   EBL: 20ml  INDICATIONS:    Russell Austin is a 73 y.o. male with ESRD and prior history of right arm brachiobasilic fistula creation. The fistula is deep and needs to be superficialized. He presents today for transposition.   FINDINGS:   Right arm brachiobasilic fistula with multiple branches, all ligated using 3-0 silk Transposition with palpable thrill at case completion Fistula size 4-67mm  TECHNIQUE:   Patient was brought to the OR and laid in supine position.  Moderate anesthesia was induced. The patient was prepped and draped in standard fashion.    The case began with right arm ultrasound fistula mapping.  Multiple branches were noted and marked.  Three longitudinal skip incisions were made along the course of the basilic vein with 5 cm bridges.  This was carried through the subcutaneous fat to the brachiobasilic fistula.  The fistula was mobilized and multiple branches ligated using 2-0 silk and clips.  Once mobilized, a gore tunneler was brought on to the field, and a subcutaneous tunnel was made along the medial aspect of the bicep. Next, the patient as heparinized, fistula marked, clamped and transected. The vein was pulled through the tunnel tract and reanastomosed using 6.0 prolene suture in running fashion. The wound bed was irrigated with saline, hemostasis achieved with suture and cautery. The wounds were closed with layers of vicryl suture with monocryl and dermabond at the skin. There was a palpable thrill in the fistula at case completion with excellent pulse in the wrist.     Given the complexity of the case,  the assistant was necessary in order to  expedient the procedure and safely perform the technical aspects of the operation.  The assistant provided traction and countertraction to assist with exposure of the artery and vein.  They also assisted with suture ligation of multiple venous branches.  They played a critical role in the anastomosis. These skills, especially following the Prolene suture for the anastomosis, could not have been adequately performed by a scrub tech assistant.   Fonda FORBES Rim, MD [facility] DATE OF DICTATION:   05/08/2024

## 2024-05-08 NOTE — Anesthesia Procedure Notes (Signed)
 Procedure Name: LMA Insertion Date/Time: 05/08/2024 9:30 AM  Performed by: Scherrie Mast, CRNAPre-anesthesia Checklist: Patient identified, Emergency Drugs available, Suction available and Patient being monitored Patient Re-evaluated:Patient Re-evaluated prior to induction Oxygen Delivery Method: Circle System Utilized Preoxygenation: Pre-oxygenation with 100% oxygen Induction Type: IV induction Ventilation: Mask ventilation without difficulty LMA: LMA inserted LMA Size: 5.0 Number of attempts: 1 Airway Equipment and Method: Bite block Placement Confirmation: positive ETCO2 Tube secured with: Tape Dental Injury: Teeth and Oropharynx as per pre-operative assessment  Comments: Large air leak noted, discontinued and placed ETT

## 2024-05-08 NOTE — Discharge Instructions (Signed)
   [facility]  Discharge Instructions  AV Fistula or Graft Surgery for Dialysis Access  Please refer to the following instructions for your post-procedure care. Your surgeon or physician assistant will discuss any changes with you.  Activity  You may drive the day following your surgery, if you are comfortable and no longer taking prescription pain medication. Resume full activity as the soreness in your incision resolves.  Bathing/Showering  You may shower after you go home. Keep your incision dry for 48 hours. Do not soak in a bathtub, hot tub, or swim until the incision heals completely. You may not shower if you have a hemodialysis catheter.  Incision Care  Clean your incision with mild soap and water  after 48 hours. Pat the area dry with a clean towel. You do not need a bandage unless otherwise instructed. Do not apply any ointments or creams to your incision. You may have skin glue on your incision. Do not peel it off. It will come off on its own in about one week. Your arm may swell a bit after surgery. To reduce swelling use pillows to elevate your arm so it is above your heart. Your doctor will tell you if you need to lightly wrap your arm with an ACE bandage.  Diet  Resume your normal diet. There are not special food restrictions following this procedure. In order to heal from your surgery, it is CRITICAL to get adequate nutrition. Your body requires vitamins, minerals, and protein. Vegetables are the best source of vitamins and minerals. Vegetables also provide the perfect balance of protein. Processed food has little nutritional value, so try to avoid this.  Medications  Resume taking all of your medications. If your incision is causing pain, you may take over-the counter pain relievers such as acetaminophen  (Tylenol ). If you were prescribed a stronger pain medication, please be aware these medications can cause nausea and constipation. Prevent  nausea by taking the medication with a snack or meal. Avoid constipation by drinking plenty of fluids and eating foods with high amount of fiber, such as fruits, vegetables, and grains.  Do not take Tylenol  if you are taking prescription pain medications.  Follow up Your surgeon may want to see you in the office following your access surgery. If so, this will be arranged at the time of your surgery.  Please call us  immediately for any of the following conditions:  Increased pain, redness, drainage (pus) from your incision site Fever of 101 degrees or higher Severe or worsening pain at your incision site Hand pain or numbness.  Reduce your risk of vascular disease:  Stop smoking. If you would like help, call [facility] at 1-800-QUIT-NOW ((365) 048-9590) or [place] at (662)034-2452  Manage your cholesterol Maintain a desired weight Control your diabetes Keep your blood pressure down  Dialysis  It will take several weeks to several months for your new dialysis access to be ready for use. Your surgeon will determine when it is okay to use it. Your nephrologist will continue to direct your dialysis. You can continue to use your Permcath until your new access is ready for use.   05/08/2024 Russell Austin [id number] 08-Sep-1950  Surgeon(s): Lanis Fonda BRAVO, MD  Procedure(s): RIGHT BASILIC VEIN TRANSPOSITION   May stick graft immediately   May stick graft on designated area only:   x Do not stick fistula for 5 weeks    If you have any questions, please call the office at [phone].

## 2024-05-08 NOTE — Anesthesia Procedure Notes (Signed)
 Procedure Name: Intubation Date/Time: 05/08/2024 9:33 AM  Performed by: Scherrie Mast, CRNAPre-anesthesia Checklist: Patient identified, Emergency Drugs available, Suction available and Patient being monitored Patient Re-evaluated:Patient Re-evaluated prior to induction Oxygen Delivery Method: Circle System Utilized Preoxygenation: Pre-oxygenation with 100% oxygen Induction Type: IV induction Ventilation: Mask ventilation without difficulty Laryngoscope Size: Mac and 3 Grade View: Grade I Tube type: Oral Number of attempts: 1 Airway Equipment and Method: Stylet and Oral airway Placement Confirmation: ETT inserted through vocal cords under direct vision, positive ETCO2 and breath sounds checked- equal and bilateral Secured at: 21 cm Tube secured with: Tape Dental Injury: Teeth and Oropharynx as per pre-operative assessment

## 2024-05-08 NOTE — Anesthesia Postprocedure Evaluation (Signed)
 Anesthesia Post Note  Patient: Russell Austin  Procedure(s) Performed: RIGHT BASILIC VEIN TRANSPOSITION (Right: Arm Upper)     Patient location during evaluation: PACU Anesthesia Type: General Level of consciousness: awake and alert Pain management: pain level controlled Vital Signs Assessment: post-procedure vital signs reviewed and stable Respiratory status: spontaneous breathing, nonlabored ventilation, respiratory function stable and patient connected to nasal cannula oxygen Cardiovascular status: blood pressure returned to baseline and stable Postop Assessment: no apparent nausea or vomiting Anesthetic complications: no   There were no known notable events for this encounter.  Last Vitals:  Vitals:   05/08/24 1145 05/08/24 1152  BP: (!) 97/52 (!) 99/56  Pulse: 61 61  Resp: 15 11  Temp:  36.6 C  SpO2: 98% 100%    Last Pain:  Vitals:   05/08/24 1122  TempSrc:   PainSc: 0-No pain                 Franky JONETTA Bald

## 2024-05-08 NOTE — Transfer of Care (Signed)
 Immediate Anesthesia Transfer of Care Note  Patient: Russell Austin  Procedure(s) Performed: RIGHT BASILIC VEIN TRANSPOSITION (Right: Arm Upper)  Patient Location: PACU  Anesthesia Type:General  Level of Consciousness: awake, alert , and oriented  Airway & Oxygen Therapy: Patient Spontanous Breathing and Patient connected to nasal cannula oxygen  Post-op Assessment: Report given to RN and Post -op Vital signs reviewed and stable  Post vital signs: Reviewed and stable  Last Vitals:  Vitals Value Taken Time  BP 106/56 05/08/24 11:22  Temp 36.6 C 05/08/24 11:22  Pulse 61 05/08/24 11:29  Resp 17 05/08/24 11:29  SpO2 100 % 05/08/24 11:29  Vitals shown include unfiled device data.  Last Pain:  Vitals:   05/08/24 1122  TempSrc:   PainSc: 0-No pain      Patients Stated Pain Goal: 0 (05/08/24 0740)  Complications: No notable events documented.

## 2024-05-08 NOTE — Progress Notes (Signed)
 Dr. Tilford made aware that the patient did not take Metoprolol  this morning. Ok to hold per Dr. Tilford. BP 107/61. Heart Rate 70.

## 2024-05-08 NOTE — H&P (Signed)
 NOTE  Patient seen and examined in preop holding.  No complaints. No changes to medication history or physical exam since last seen in clinic. After discussing the risks and benefits of  RIGHT brachiobasilic vein transposition, Starleen LABOR Holzhauer elected to proceed.   Fonda FORBES Rim MD   CC:  F/u for surgery  HPI:  This is a 73 y.o. 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 Rim.  He has TDC that was placed on 12/30/2023 by Dr. Rim.   Pt states he does  have pain/numbness in the right hand but this has been present since his stroke and is unchanged since fistula creation.    The pt is on dialysis T/T/S at [facility] location.  Dialysis access hx: -PD catheter placement 04/11/2023 Dr. Magda -removal PD cath (infection) 12/30/2023 Rim -right 1st stage BVT 03/09/2024 Dr. Rim   Allergies  Allergen Reactions   Lactose Intolerance (Gi) Diarrhea   Peach [Prunus Persica] Itching and Other (See Comments)    ITCHY THROAT   Apple Pectin [Pectin] Itching and Other (See Comments)    ITCHY THROAT   Metformin  And Related Other (See Comments)    D/t decreased kidney function    Morphine  And Codeine Anxiety    Current Facility-Administered Medications  Medication Dose Route Frequency Provider Last Rate Last Admin   0.9 %  sodium chloride  infusion   Intravenous Continuous Beauregard Jarrells E, MD       0.9 %  sodium chloride  infusion   Intravenous Continuous Hollis, Kevin D, MD       ceFAZolin  (ANCEF ) IVPB 2g/100 mL premix  2 g Intravenous 30 min Pre-Op  Ashly Yepez E, MD       chlorhexidine  (HIBICLENS ) 4 % liquid 4 Application  60 mL Topical Once Magdalen Cabana E, MD       And   [START ON 05/09/2024] chlorhexidine  (HIBICLENS ) 4 % liquid 4 Application  60 mL Topical Once Fatou Dunnigan E, MD       chlorhexidine  (PERIDEX ) 0.12 % solution 15 mL  15 mL Mouth/Throat Once Hollis, Kevin D, MD       Or   Oral care mouth rinse  15 mL Mouth Rinse Once Hollis,  Kevin D, MD       insulin  aspart (novoLOG ) injection 0-7 Units  0-7 Units Subcutaneous Q2H PRN Tilford Franky BIRCH, MD         ROS:  See HPI  Physical Exam:  Today's Vitals   05/08/24 0735 05/08/24 0736 05/08/24 0740  BP: 107/61    Pulse: 77 70   Resp: 18    Temp: 97.9 F (36.6 C)    TempSrc: Oral    SpO2: 99%    Weight: 74.8 kg    Height: 5' 9 (1.753 m)    PainSc:   0-No pain   Body mass index is 24.37 kg/m.   Incision:  well healed Extremities:   There is a palpable right radial pulse.   Motor and sensory are in tact.   There is a thrill/bruit present.  Access is  easily palpable   Dialysis Duplex on 04/13/2024: Findings:  +--------------------+----------+-----------------+--------+  AVF                PSV (cm/s)Flow Vol (mL/min)Comments  +--------------------+----------+-----------------+--------+  Native artery inflow   277          2534                 +--------------------+----------+-----------------+--------+  AVF Anastomosis        898                               +--------------------+----------+-----------------+--------+     +------------+----------+-------------+----------+--------+  OUTFLOW VEINPSV (cm/s)Diameter (cm)Depth (cm)Describe  +------------+----------+-------------+----------+--------+  Prox UA        367        0.58        0.59             +------------+----------+-------------+----------+--------+  Mid UA         264        0.83        0.66             +------------+----------+-------------+----------+--------+  Dist UA        125        0.77        0.69             +------------+----------+-------------+----------+--------+   Summary:  Patent right brachial-basilic AVF. There is no evidence of thrombus or  stenosis within the AVF. Elevated velocity is seen at the anastomosis.       Assessment/Plan:  This is a 73 y.o. 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 Lanis.    -the pt does not have evidence of steal.  He does have pain in the right hand, however this is unchanged from his stroke and has not changed since fistula creation.  -fistula has excellent thrill and 2.5L flow volume and maturing.  Will schedule him for 2nd stage BVT with Dr. Lanis on pt non dialysis day.  -if pt has tunneled catheter, this can be removed at the discretion of the dialysis center once the pt's access has been successfully cannulated to their satisfaction.  -discussed with pt that access does not last forever and will need intervention or even new access at some point.    Lucie Apt, [facility] [phone]  Clinic MD:  Serene

## 2024-05-11 ENCOUNTER — Other Ambulatory Visit: Payer: Self-pay | Admitting: Adult Health

## 2024-05-11 ENCOUNTER — Encounter (HOSPITAL_COMMUNITY): Payer: Self-pay | Admitting: Vascular Surgery

## 2024-05-11 DIAGNOSIS — F339 Major depressive disorder, recurrent, unspecified: Secondary | ICD-10-CM

## 2024-05-12 DIAGNOSIS — D689 Coagulation defect, unspecified: Secondary | ICD-10-CM | POA: Diagnosis not present

## 2024-05-12 DIAGNOSIS — T8249XD Other complication of vascular dialysis catheter, subsequent encounter: Secondary | ICD-10-CM | POA: Diagnosis not present

## 2024-05-12 DIAGNOSIS — Z992 Dependence on renal dialysis: Secondary | ICD-10-CM | POA: Diagnosis not present

## 2024-05-12 DIAGNOSIS — N186 End stage renal disease: Secondary | ICD-10-CM | POA: Diagnosis not present

## 2024-05-12 DIAGNOSIS — D631 Anemia in chronic kidney disease: Secondary | ICD-10-CM | POA: Diagnosis not present

## 2024-05-12 DIAGNOSIS — N2581 Secondary hyperparathyroidism of renal origin: Secondary | ICD-10-CM | POA: Diagnosis not present

## 2024-05-19 DIAGNOSIS — D689 Coagulation defect, unspecified: Secondary | ICD-10-CM | POA: Diagnosis not present

## 2024-05-19 DIAGNOSIS — E875 Hyperkalemia: Secondary | ICD-10-CM | POA: Diagnosis not present

## 2024-05-19 DIAGNOSIS — N2581 Secondary hyperparathyroidism of renal origin: Secondary | ICD-10-CM | POA: Diagnosis not present

## 2024-05-19 DIAGNOSIS — Z992 Dependence on renal dialysis: Secondary | ICD-10-CM | POA: Diagnosis not present

## 2024-05-19 DIAGNOSIS — T8249XD Other complication of vascular dialysis catheter, subsequent encounter: Secondary | ICD-10-CM | POA: Diagnosis not present

## 2024-05-19 DIAGNOSIS — N186 End stage renal disease: Secondary | ICD-10-CM | POA: Diagnosis not present

## 2024-05-25 ENCOUNTER — Encounter: Payer: Self-pay | Admitting: Podiatry

## 2024-05-25 ENCOUNTER — Ambulatory Visit (INDEPENDENT_AMBULATORY_CARE_PROVIDER_SITE_OTHER): Admitting: Podiatry

## 2024-05-25 DIAGNOSIS — Z794 Long term (current) use of insulin: Secondary | ICD-10-CM

## 2024-05-25 DIAGNOSIS — E1140 Type 2 diabetes mellitus with diabetic neuropathy, unspecified: Secondary | ICD-10-CM | POA: Diagnosis not present

## 2024-05-25 DIAGNOSIS — L84 Corns and callosities: Secondary | ICD-10-CM | POA: Diagnosis not present

## 2024-05-25 DIAGNOSIS — B351 Tinea unguium: Secondary | ICD-10-CM | POA: Diagnosis not present

## 2024-05-25 DIAGNOSIS — M79675 Pain in left toe(s): Secondary | ICD-10-CM

## 2024-05-25 DIAGNOSIS — M79674 Pain in right toe(s): Secondary | ICD-10-CM

## 2024-05-25 NOTE — Progress Notes (Signed)
 Subjective:  Patient ID: Russell Austin, male    DOB: 07/13/1951,   MRN: [id number]  Chief Complaint  Patient presents with   Diabetes    Trim my nails and sand my heels.  Saw Darleene Shape, NP - 03/18/2024; A1c - 6.9    73 y.o. male presents for concern of thickened elongated and painful nails that are difficult to trim. Requesting to have them trimmed today. Relates burning and tingling in their feet. Patient is diabetic and last A1c was  Lab Results  Component Value Date   HGBA1C 6.9 (A) 03/18/2024   .    PCP:  Shape Darleene, NP    . Denies any other pedal complaints. Denies n/v/f/c.   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 CFT <3 seconds. Absent hair growth on digits. Edema noted to bilateral lower extremities. Xerosis noted bilaterally.  Skin. No lacerations or abrasions bilateral feet. Nails 1-5 bilateral  are thickened discolored and elongated with subungual debris. Hyperkeratotic lesion noted plantar left heel and posterior right heel.  Musculoskeletal: MMT 4/5 bilateral lower extremities in DF, PF, Inversion and Eversion. Deceased ROM in DF of ankle joint. Mild HAV deformity bilateral and hammered digits 2-5 bilateral.  Neurological: Sensation intact to light touch. Protective sensation diminished bilateral.    ABI Findings:  +---------+------------------+-----+--------+--------+  Right   Rt Pressure (mmHg)IndexWaveformComment   +---------+------------------+-----+--------+--------+  Brachial 109                                       +---------+------------------+-----+--------+--------+  PTA     181               1.57                   +---------+------------------+-----+--------+--------+  DP      171               1.49                   +---------+------------------+-----+--------+--------+  Great Toe165               1.43                   +---------+------------------+-----+--------+--------+   +---------+------------------+-----+--------+-------+  Left    Lt Pressure (mmHg)IndexWaveformComment  +---------+------------------+-----+--------+-------+  Brachial 115                                     +---------+------------------+-----+--------+-------+  PTA     163               1.42                  +---------+------------------+-----+--------+-------+  DP      180  1.57                  +---------+------------------+-----+--------+-------+  [place] Toe164               1.43                  +---------+------------------+-----+--------+-------+       TOES Findings:  +----------+---------------+--------+-------+  Right ToesPressure (mmHg)WaveformComment  +----------+---------------+--------+-------+  1st Digit                Normal           +----------+---------------+--------+-------+  2nd Digit                Normal           +----------+---------------+--------+-------+  3rd Digit                Normal           +----------+---------------+--------+-------+  4th Digit                Normal           +----------+---------------+--------+-------+  5th Digit                Normal           +----------+---------------+--------+-------+      +---------+---------------+--------+-------+  Left ToesPressure (mmHg)WaveformComment  +---------+---------------+--------+-------+  1st Digit               Normal           +---------+---------------+--------+-------+  2nd Digit                Normal           +---------+---------------+--------+-------+  3rd Digit               Normal           +---------+---------------+--------+-------+  4th Digit               Normal           +---------+---------------+--------+-------+  5th Digit               Normal           +---------+---------------+--------+-------+           Summary:  Right: Resting right ankle-brachial index indicates noncompressible right  lower extremity arteries. The right toe-brachial index is normal.   Left: Resting left ankle-brachial index indicates noncompressible left  lower extremity arteries. The left toe-brachial index is normal.   Assessment:   1. Pain due to onychomycosis of toenails of both feet   2. Type 2 diabetes mellitus with diabetic neuropathy, with long-term current use of insulin  (HCC)        Plan:  Patient was evaluated and treated and all questions answered. -Discussed and educated patient on diabetic foot care, especially with  regards to the vascular, neurological and musculoskeletal systems.  -Stressed the importance of good glycemic control and the detriment of not  controlling glucose levels in relation to the foot. -Discussed supportive shoes at all times and checking feet regularly.  -Mechanically debrided all nails 1-5 bilateral using sterile nail nipper and filed with dremel without incident  -Hyperkeratotc area debrided with chisel without incident x 2 to plantar heels.  -Answered all patient questions -Patient to return  in 3 months for at risk foot care -Patient advised to call the office if any problems or questions arise in the meantime.   Asberry Failing, DPM

## 2024-05-26 DIAGNOSIS — D689 Coagulation defect, unspecified: Secondary | ICD-10-CM | POA: Diagnosis not present

## 2024-05-26 DIAGNOSIS — E875 Hyperkalemia: Secondary | ICD-10-CM | POA: Diagnosis not present

## 2024-05-26 DIAGNOSIS — N186 End stage renal disease: Secondary | ICD-10-CM | POA: Diagnosis not present

## 2024-05-26 DIAGNOSIS — Z992 Dependence on renal dialysis: Secondary | ICD-10-CM | POA: Diagnosis not present

## 2024-05-26 DIAGNOSIS — N2581 Secondary hyperparathyroidism of renal origin: Secondary | ICD-10-CM | POA: Diagnosis not present

## 2024-05-26 DIAGNOSIS — T8249XD Other complication of vascular dialysis catheter, subsequent encounter: Secondary | ICD-10-CM | POA: Diagnosis not present

## 2024-05-27 ENCOUNTER — Other Ambulatory Visit (INDEPENDENT_AMBULATORY_CARE_PROVIDER_SITE_OTHER)

## 2024-05-27 DIAGNOSIS — E119 Type 2 diabetes mellitus without complications: Secondary | ICD-10-CM

## 2024-05-27 DIAGNOSIS — Z7984 Long term (current) use of oral hypoglycemic drugs: Secondary | ICD-10-CM

## 2024-05-27 NOTE — Progress Notes (Signed)
 05/27/2024 Name: Russell Austin MRN: [id number] DOB: 1951-04-05  Chief Complaint  Patient presents with   Medication Management   Diabetes    Russell Austin is a 73 y.o. year old male who presented for a telephone visit.   They were referred to the pharmacist by their PCP for assistance in managing diabetes and complex medication management.    Subjective:  Care Team: Primary Care Provider: Merna Huxley, NP   Medication Access/Adherence  Current Pharmacy:  [facility] [address], [place] - [address] [place] [address] Phone: [phone] Fax: [phone]  [facility] [address] [place] [address] Phone: [phone] Fax: [phone]   Patient reports affordability concerns with their medications: No  Patient reports access/transportation concerns to their pharmacy: No  Patient reports adherence concerns with their medications:  No  Diabetes:  Current Medications: Glipizide  5mg  daily except for dialysis days (Tues/Thurs/Sat)    No longer on insulin .  Admits to skipping some days of glipizide   Hyperlipidemia/ASCVD Risk Reduction  Current lipid lowering medications: Atorvastatin  10mg  Medications tried in the past: None  Lipitor was previously being held due to elevated LFTs, reports he restarted some time ago  Antiplatelet regimen:  Aspirin  81mg      Objective:  Lab Results  Component Value Date   HGBA1C 6.9 (A) 03/18/2024    Lab Results  Component Value Date   CREATININE 4.30 (H) 05/08/2024   BUN 39 (H) 05/08/2024   NA 138 05/08/2024   K 3.8 05/08/2024   CL 98 05/08/2024   CO2 29 01/26/2024    Lab Results  Component Value Date   CHOL 90 02/28/2023   HDL 26.30 (L) 02/28/2023   LDLCALC 46 02/28/2023   TRIG 88.0 02/28/2023   CHOLHDL 3 02/28/2023    Medications Reviewed Today     Reviewed by Russell Austin, RPH (Pharmacist) on 05/27/24 at 1023  Med  List Status: <None>   Medication Order Taking? Sig Documenting Provider Last Dose Status Informant  albuterol  (PROVENTIL ) (2.5 MG/3ML) 0.083% nebulizer solution [id number]  Take 3 mLs (2.5 mg total) by nebulization every 6 (six) hours as needed for wheezing or shortness of breath.  Patient not taking: Reported on 05/27/2024   Sherrill Alejandro Donovan, DO  Active Self, Pharmacy Records  amLODipine  (NORVASC ) 10 MG tablet [id number] Yes Take 1 tablet (10 mg total) by mouth daily. Vernon Ranks, MD  Active Self, Pharmacy Records           Med Note Russell Austin Austin Stevan Apr 15, 2024 11:38 AM)    aspirin  EC 81 MG tablet [id number] Yes Take 81 mg by mouth daily. [provider]  Active Self, Pharmacy Records  atorvastatin  (LIPITOR) 10 MG tablet [id number] Yes Take 1 tablet (10 mg total) by mouth every morning. Nafziger, Huxley, NP  Active Self, Pharmacy Records  buPROPion  (WELLBUTRIN  XL) 300 MG 24 hr tablet [id number] Yes TAKE 1 TABLET (300 MG TOTAL) BY MOUTH EVERY MORNING. Nafziger, Huxley, NP  Active   calcitRIOL  (ROCALTROL ) 0.5 MCG capsule [id number]  Take 1 capsule (0.5 mcg total) by mouth daily.  Patient not taking: Reported on 05/27/2024   Sherrill Alejandro Donovan, DO  Active Self, Pharmacy Records           Med Note Russell Austin Austin Stevan Apr 15, 2024 11:39 AM)    chlorproMAZINE  (THORAZINE ) 25 MG tablet [id number]  TAKE 1 TABLET BY MOUTH THREE TIMES A DAY  Patient taking differently: Take 25 mg by mouth daily as needed for hiccoughs.   Russell Huxley, NP  Active Self, Pharmacy Records  chlorthalidone (HYGROTON) 25 MG tablet [id number]  Take 25 mg by mouth daily.  Patient not taking: Reported on 05/27/2024   [provider]  Active Self, Pharmacy Records  Continuous Glucose Sensor (FREESTYLE LIBRE 3 PLUS SENSOR) [place] [id number]  Change sensor every 15 days. Russell Huxley, NP  Active Self, Pharmacy Records  gabapentin  (NEURONTIN ) 300 MG capsule [id number] Yes TAKE ONE CAPSULE BY MOUTH EVERYDAY  AT BEDTIME Russell Huxley, NP  Active Self, Pharmacy Records           Med Note ROLENE EVERN PULLING A   Sat Jan 25, 2024  3:58 PM)     Patient not taking:   Discontinued 05/27/24 1009 (Completed Course) glipiZIDE  (GLUCOTROL  XL) 5 MG 24 hr tablet [id number] Yes Take 5 mg by mouth See admin instructions. Taking daily except for dialysis days (Tues/Thurs/Sat) [provider]  Active Self, Pharmacy Records  insulin  glargine (LANTUS ) 100 UNIT/ML injection [id number]  Inject 15 Units into the skin daily as needed (Blood sugar).  Patient not taking: Reported on 05/27/2024   [provider]  Active   losartan  (COZAAR ) 100 MG tablet [id number] Yes TAKE 1 TABLET BY MOUTH EVERY DAY IN THE MORNING Nafziger, Huxley, NP  Active Self, Pharmacy Records  metoCLOPramide  (REGLAN ) 10 MG tablet [id number] Yes TAKE 1 TABLET BY MOUTH 4 TIMES DAILY. Russell Huxley, NP  Active Self, Pharmacy Records           Med Note STEFFI NIAN   Sat Jan 25, 2024  1:52 PM)    metoprolol  tartrate (LOPRESSOR ) 100 MG tablet [id number] Yes Take 1 tablet (100 mg total) by mouth 2 (two) times daily. Russell Huxley, NP  Active Self, Pharmacy Records    Discontinued 05/27/24 1011 (Completed Course)   pantoprazole  (PROTONIX ) 40 MG tablet [id number] Yes Take 1 tablet (40 mg total) by mouth daily.  Patient taking differently: Take 40 mg by mouth daily. PRN   Mesner, Jason, MD  Active Self, Pharmacy Records  potassium chloride  SA (KLOR-CON  M) 20 MEQ tablet [id number]  TAKE 1 TABLET BY MOUTH EVERY DAY Nafziger, Huxley, NP  Active   tiZANidine  (ZANAFLEX ) 4 MG tablet [id number] Yes Take 1 tablet (4 mg total) by mouth every 6 (six) hours as needed for muscle spasms. Russell Huxley, NP  Active Self, Pharmacy Records  torsemide  (DEMADEX ) 20 MG tablet [id number] Yes Take 20 mg by mouth daily. [provider]  Active Self, Pharmacy Records              Assessment/Plan:   Diabetes: - Currently controlled at last A1C  check but sugars appear to be trending up - Reviewed long term cardiovascular and renal outcomes of uncontrolled blood sugar - Reviewed goal A1c, goal fasting, and goal 2 hour post prandial glucose -Counseled on changing sensor when it is due to be replaced. -Continue current medication therapy. Counseled on importance of adherence to glipizide  on non-dialysis days  Hyperlipidemia/ASCVD Risk Reduction: - Currently controlled.  - Reviewed long term complications of uncontrolled cholesterol - Recommend to continue current med therapy. Recommend updated lipid panel and CMP at next PCP visit        Follow Up Plan: 2 months (sees PCP in 1 month)  Jon VEAR Lindau, PharmD Clinical Pharmacist [phone]

## 2024-06-02 DIAGNOSIS — N186 End stage renal disease: Secondary | ICD-10-CM | POA: Diagnosis not present

## 2024-06-02 DIAGNOSIS — Z992 Dependence on renal dialysis: Secondary | ICD-10-CM | POA: Diagnosis not present

## 2024-06-02 DIAGNOSIS — E1122 Type 2 diabetes mellitus with diabetic chronic kidney disease: Secondary | ICD-10-CM | POA: Diagnosis not present

## 2024-06-02 DIAGNOSIS — E875 Hyperkalemia: Secondary | ICD-10-CM | POA: Diagnosis not present

## 2024-06-02 DIAGNOSIS — T8249XD Other complication of vascular dialysis catheter, subsequent encounter: Secondary | ICD-10-CM | POA: Diagnosis not present

## 2024-06-02 DIAGNOSIS — D689 Coagulation defect, unspecified: Secondary | ICD-10-CM | POA: Diagnosis not present

## 2024-06-02 DIAGNOSIS — N2581 Secondary hyperparathyroidism of renal origin: Secondary | ICD-10-CM | POA: Diagnosis not present

## 2024-06-02 DIAGNOSIS — Z23 Encounter for immunization: Secondary | ICD-10-CM | POA: Diagnosis not present

## 2024-06-03 ENCOUNTER — Other Ambulatory Visit: Payer: Self-pay | Admitting: Adult Health

## 2024-06-04 ENCOUNTER — Encounter

## 2024-06-04 DIAGNOSIS — N186 End stage renal disease: Secondary | ICD-10-CM | POA: Diagnosis not present

## 2024-06-04 DIAGNOSIS — D689 Coagulation defect, unspecified: Secondary | ICD-10-CM | POA: Diagnosis not present

## 2024-06-04 DIAGNOSIS — Z992 Dependence on renal dialysis: Secondary | ICD-10-CM | POA: Diagnosis not present

## 2024-06-04 DIAGNOSIS — T8249XD Other complication of vascular dialysis catheter, subsequent encounter: Secondary | ICD-10-CM | POA: Diagnosis not present

## 2024-06-04 DIAGNOSIS — N2581 Secondary hyperparathyroidism of renal origin: Secondary | ICD-10-CM | POA: Diagnosis not present

## 2024-06-09 DIAGNOSIS — N186 End stage renal disease: Secondary | ICD-10-CM | POA: Diagnosis not present

## 2024-06-09 DIAGNOSIS — N2581 Secondary hyperparathyroidism of renal origin: Secondary | ICD-10-CM | POA: Diagnosis not present

## 2024-06-09 DIAGNOSIS — T8249XD Other complication of vascular dialysis catheter, subsequent encounter: Secondary | ICD-10-CM | POA: Diagnosis not present

## 2024-06-09 DIAGNOSIS — D689 Coagulation defect, unspecified: Secondary | ICD-10-CM | POA: Diagnosis not present

## 2024-06-09 DIAGNOSIS — Z992 Dependence on renal dialysis: Secondary | ICD-10-CM | POA: Diagnosis not present

## 2024-06-12 ENCOUNTER — Ambulatory Visit: Attending: Vascular Surgery | Admitting: Physician Assistant

## 2024-06-12 VITALS — BP 113/66 | HR 66 | Temp 97.7°F

## 2024-06-12 DIAGNOSIS — N186 End stage renal disease: Secondary | ICD-10-CM

## 2024-06-12 DIAGNOSIS — Z992 Dependence on renal dialysis: Secondary | ICD-10-CM

## 2024-06-15 DIAGNOSIS — Z992 Dependence on renal dialysis: Secondary | ICD-10-CM | POA: Diagnosis not present

## 2024-06-15 DIAGNOSIS — D689 Coagulation defect, unspecified: Secondary | ICD-10-CM | POA: Diagnosis not present

## 2024-06-15 DIAGNOSIS — N186 End stage renal disease: Secondary | ICD-10-CM | POA: Diagnosis not present

## 2024-06-15 DIAGNOSIS — N2581 Secondary hyperparathyroidism of renal origin: Secondary | ICD-10-CM | POA: Diagnosis not present

## 2024-06-15 DIAGNOSIS — T8249XD Other complication of vascular dialysis catheter, subsequent encounter: Secondary | ICD-10-CM | POA: Diagnosis not present

## 2024-06-17 NOTE — Progress Notes (Signed)
 POST OPERATIVE OFFICE NOTE    CC:  F/u for surgery  HPI: Russell Austin is a 73 y.o. male who is here for postop visit.  He recently underwent right brachiobasilic fistula transposition on 05/08/2024 by Dr. Lanis.  This was done for permanent dialysis access.  He returns today for follow-up.  He has no complaints at today's office visit.  He says that his right arm incisions have healed well without any drainage, redness, or tenderness.  He denies any symptoms of steal in the right hand such as weakness, numbness, pain, or excessive coldness.  He currently dialyzes on Tuesdays, Thursdays, and Saturdays via left IJ TDC.   Allergies  Allergen Reactions   Lactose Intolerance (Gi) Diarrhea   Peach [Prunus Persica] Itching and Other (See Comments)    ITCHY THROAT   Apple Pectin [Pectin] Itching and Other (See Comments)    ITCHY THROAT   Metformin  And Related Other (See Comments)    D/t decreased kidney function    Morphine  And Codeine Anxiety    Current Outpatient Medications  Medication Sig Dispense Refill   albuterol  (PROVENTIL ) (2.5 MG/3ML) 0.083% nebulizer solution Take 3 mLs (2.5 mg total) by nebulization every 6 (six) hours as needed for wheezing or shortness of breath. (Patient not taking: Reported on 05/27/2024) 75 mL 12   amLODipine  (NORVASC ) 10 MG tablet Take 1 tablet (10 mg total) by mouth daily. 30 tablet 0   aspirin  EC 81 MG tablet Take 81 mg by mouth daily.     atorvastatin  (LIPITOR) 10 MG tablet Take 1 tablet (10 mg total) by mouth every morning. 90 tablet 3   buPROPion  (WELLBUTRIN  XL) 300 MG 24 hr tablet TAKE 1 TABLET (300 MG TOTAL) BY MOUTH EVERY MORNING. 90 tablet 1   calcitRIOL  (ROCALTROL ) 0.5 MCG capsule Take 1 capsule (0.5 mcg total) by mouth daily. (Patient not taking: Reported on 05/27/2024)     chlorproMAZINE  (THORAZINE ) 25 MG tablet TAKE 1 TABLET BY MOUTH THREE TIMES A DAY (Patient taking differently: Take 25 mg by mouth daily as needed for hiccoughs.) 270  tablet 1   chlorthalidone (HYGROTON) 25 MG tablet Take 25 mg by mouth daily. (Patient not taking: Reported on 05/27/2024)     Continuous Glucose Sensor (FREESTYLE LIBRE 3 PLUS SENSOR) MISC Change sensor every 15 days. 6 each 3   gabapentin  (NEURONTIN ) 300 MG capsule TAKE ONE CAPSULE BY MOUTH EVERYDAY AT BEDTIME 90 capsule 3   glipiZIDE  (GLUCOTROL  XL) 5 MG 24 hr tablet Take 5 mg by mouth See admin instructions. Taking daily except for dialysis days (Tues/Thurs/Sat)     insulin  glargine (LANTUS ) 100 UNIT/ML injection Inject 15 Units into the skin daily as needed (Blood sugar). (Patient not taking: Reported on 05/27/2024)     losartan  (COZAAR ) 100 MG tablet TAKE 1 TABLET BY MOUTH EVERY DAY IN THE MORNING 90 tablet 1   metoCLOPramide  (REGLAN ) 10 MG tablet TAKE 1 TABLET BY MOUTH 4 TIMES DAILY. 120 tablet 0   metoprolol  tartrate (LOPRESSOR ) 100 MG tablet Take 1 tablet (100 mg total) by mouth 2 (two) times daily. 180 tablet 3   pantoprazole  (PROTONIX ) 40 MG tablet Take 1 tablet (40 mg total) by mouth daily. (Patient taking differently: Take 40 mg by mouth daily. PRN) 90 tablet 0   potassium chloride  SA (KLOR-CON  M) 20 MEQ tablet TAKE 1 TABLET BY MOUTH EVERY DAY 90 tablet 1   tiZANidine  (ZANAFLEX ) 4 MG tablet Take 1 tablet (4 mg total) by mouth every 6 (six) hours  as needed for muscle spasms. 90 tablet 1   torsemide  (DEMADEX ) 20 MG tablet Take 20 mg by mouth daily.     No current facility-administered medications for this visit.     ROS:  See HPI  Physical Exam:  Incision: Right upper arm incisions completely healed without hematoma or signs of infection Extremities: Palpable right radial pulse.  Right brachiobasilic fistula with great thrill Neuro: Intact motor and sensation of right upper extremity    Assessment/Plan:  This is a 73 y.o. male who is here for a postop visit  - The patient recently underwent right brachiobasilic fistula transposition on 05/08/2024.  His right upper arm incisions are  well-healed without signs of infection, dehiscence, or hematoma -He denies any symptoms of steal in the right upper extremity.  He has a palpable right radial pulse -On exam his right upper arm fistula has a great thrill on palpation - His fistula should be ready for use by 06/18/2024.  If this functions well for at least 3 dialysis sessions he can call our office for [facility] removal -He can follow-up with our office as needed   Ahmed Holster, PA-C [facility] [phone]   Clinic MD: [place]

## 2024-06-23 DIAGNOSIS — D689 Coagulation defect, unspecified: Secondary | ICD-10-CM | POA: Diagnosis not present

## 2024-06-23 DIAGNOSIS — N2581 Secondary hyperparathyroidism of renal origin: Secondary | ICD-10-CM | POA: Diagnosis not present

## 2024-06-23 DIAGNOSIS — T8249XD Other complication of vascular dialysis catheter, subsequent encounter: Secondary | ICD-10-CM | POA: Diagnosis not present

## 2024-06-23 DIAGNOSIS — Z992 Dependence on renal dialysis: Secondary | ICD-10-CM | POA: Diagnosis not present

## 2024-06-26 ENCOUNTER — Encounter: Payer: Self-pay | Admitting: Adult Health

## 2024-06-26 ENCOUNTER — Ambulatory Visit: Admitting: Adult Health

## 2024-06-26 VITALS — BP 120/60 | HR 71 | Temp 97.8°F | Ht 69.0 in | Wt 151.0 lb

## 2024-06-26 DIAGNOSIS — E119 Type 2 diabetes mellitus without complications: Secondary | ICD-10-CM | POA: Diagnosis not present

## 2024-06-26 DIAGNOSIS — Z7984 Long term (current) use of oral hypoglycemic drugs: Secondary | ICD-10-CM

## 2024-06-26 DIAGNOSIS — I10 Essential (primary) hypertension: Secondary | ICD-10-CM

## 2024-06-26 LAB — POCT GLYCOSYLATED HEMOGLOBIN (HGB A1C): Hemoglobin A1C: 6.4 % — AB (ref 4.0–5.6)

## 2024-06-26 MED ORDER — FREESTYLE LIBRE 3 PLUS SENSOR MISC: 6 refills | Status: AC

## 2024-06-26 NOTE — Progress Notes (Signed)
 Subjective:    Patient ID: Russell Austin, male    DOB: [birth year], 73 y.o.   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  He presents to the office today for three month follow up regarding DM and HTN   DM II -he is currently managed with  glipizide  5 mg at bedtime. He denies hypoglycemic events. He is using a CGM to monitor his blood sugars.   CONTINUOUS GLUCOSE MONITORING RECORD INTERPRETATION                 Dates of Recording: July 27th - October 24th    Sensor description: freestyle libre    Results statistics:      Average  190  Time in range 51%  % Time Above 180 27%  % Time above 250 21%  % Time Below target 0%     Lab Results  Component Value Date   HGBA1C 6.9 (A) 03/18/2024   HGBA1C 6.8 (A) 12/10/2023   HGBA1C 9.5 (A) 08/29/2023   Hypertension with chronic kidney disease on dialysis -managed with Norvasc  5 mg daily, He denies dizziness, lightheadedness, chest pain, shortness of breath, or syncopal episodes. 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)    Chronic kidney disease    Diabetes mellitus type II    Diabetic retinopathy FOLLOWED BY DR ELNER   ED (erectile dysfunction)    Glaucoma of both eyes    Hypertension    Hypertensive urgency 12/27/2023   Left hydrocele    Stroke  [facility])     Social History   Socioeconomic History   Marital status: Married    Spouse name: Not on file   Number of children: 1   Years of education: 12   Highest education level: 12th grade  Occupational History   Occupation: Disabled  Tobacco Use   Smoking status: Never   Smokeless tobacco: Never  Vaping Use   Vaping status: Never Used  Substance and Sexual Activity   Alcohol use: No   Drug use: No   Sexual activity: Not on file  Other Topics Concern   Not on file  Social History Narrative   Lives at home with his wife and granddaughter.   Left-handed.   3 cups caffeine per day.   Social Drivers of Corporate investment banker Strain: Low Risk  (03/14/2024)   Overall Financial Resource Strain (CARDIA)    Difficulty of Paying Living Expenses: Not hard at all  Food Insecurity: No Food Insecurity (03/14/2024)   Hunger Vital Sign    Worried About Running Out of Food in the Last Year: Never true    Ran Out of Food in the Last Year: Never true  Transportation Needs: No Transportation Needs (03/14/2024)   PRAPARE - Administrator, Civil Service (Medical): No    Lack of Transportation (Non-Medical):  No  Physical Activity: Inactive (03/14/2024)   Exercise Vital Sign    Days of Exercise per Week: 0 days    Minutes of Exercise per Session: Not on file  Stress: Stress Concern Present (03/14/2024)   [employer] of Occupational Health - Occupational Stress Questionnaire    Feeling of Stress: Very much  Social Connections: Socially Integrated (03/14/2024)   Social Connection and Isolation Panel    Frequency of Communication with Friends and Family: More than three times a week    Frequency of Social Gatherings with Friends and Family: Twice a week    Attends Religious Services: More than 4 times per year    Active Member of [employer] or Organizations: Yes    Attends Banker Meetings: Never    Marital Status: Married  Catering manager Violence: Not At Risk  (02/07/2024)   Humiliation, Afraid, Rape, and Kick questionnaire    Fear of Current or Ex-Partner: No    Emotionally Abused: No    Physically Abused: No    Sexually Abused: No    Past Surgical History:  Procedure Laterality Date   APPENDECTOMY  AGE EARLY 20'S   AV FISTULA PLACEMENT Right 03/09/2024   Procedure: ARTERIOVENOUS (AV) FISTULA CREATION;  Surgeon: Lanis Fonda BRAVO, MD;  Location: [facility] OR;  Service: Vascular;  Laterality: Right;   BASCILIC VEIN TRANSPOSITION Right 05/08/2024   Procedure: RIGHT BASILIC VEIN TRANSPOSITION;  Surgeon: Lanis Fonda BRAVO, MD;  Location: [facility] OR;  Service: Vascular;  Laterality: Right;   CAPD INSERTION N/A 04/11/2023   Procedure: LAPAROSCOPIC INSERTION CONTINUOUS AMBULATORY PERITONEAL DIALYSIS  (CAPD) CATHETER WITH  OMENTOPEXY;  Surgeon: Magda Debby SAILOR, MD;  Location: [facility] OR;  Service: Vascular;  Laterality: N/A;   CAPD REMOVAL N/A 12/30/2023   Procedure: CONTINUOUS AMBULATORY PERITONEAL DIALYSIS  (CAPD) CATHETER REMOVAL;  Surgeon: Lanis Fonda BRAVO, MD;  Location: [facility] OR;  Service: Vascular;  Laterality: N/A;   COLONOSCOPY  12/30/2020   every 5 years   HYDROCELE EXCISION  03/31/2012   Procedure: HYDROCELECTOMY ADULT;  Surgeon: Garnette Shack, MD;  Location: [facility];  Service: Urology;  Laterality: Left;  45 MINS     INSERTION OF DIALYSIS CATHETER N/A 12/30/2023   Procedure: EXCHANGE OF DIALYSIS CATHETER;  Surgeon: Lanis Fonda BRAVO, MD;  Location: [facility] OR;  Service: Vascular;  Laterality: N/A;   IR FLUORO GUIDE CV LINE LEFT  12/27/2023   IR US  GUIDE VASC ACCESS LEFT  12/27/2023   LEFT EYE LASER RETINA REPAIR  SEPT 2012   RIGHT EYE VITRECTOMY/ INSERTION GLAUCOMA SETON/ LASER REPAIR  12-13-2008   RETINAL ARTERY OCCLUSION /NEOVASCULAR GLAUCOMA/ HEMORRHAGE   RIGHT EYE VITRETOMY/ INSERTION GLAUCOMA SETON X2/ LASER  03-24-2009   RECURRENT HEMORRHAGE/ OCCLUSION INTERNAL SETON   SHOULDER ARTHROSCOPY Right 2005   undescended right testicle removed  1994     Family History  Problem Relation Age of Onset   Glaucoma Mother    Diabetes Mother    Stomach cancer Maternal Grandfather    Stroke Maternal Grandmother     Allergies  Allergen Reactions   Lactose Intolerance (Gi) Diarrhea   Peach [Prunus Persica] Itching and Other (See Comments)    ITCHY THROAT   Apple Pectin [Pectin] Itching and Other (See Comments)    ITCHY THROAT   Metformin  And Related Other (See Comments)    D/t decreased kidney function    Morphine  And Codeine Anxiety    Current Outpatient Medications on File Prior to Visit  Medication Sig Dispense  Refill   amLODipine  (NORVASC ) 10 MG tablet Take 1 tablet (10 mg total) by mouth daily. 30 tablet 0   aspirin  EC 81 MG tablet Take 81 mg by mouth daily.     atorvastatin  (LIPITOR) 10 MG tablet Take 1 tablet (10 mg total) by mouth every morning. 90 tablet 3   buPROPion  (WELLBUTRIN  XL) 300 MG 24 hr tablet TAKE 1 TABLET (300 MG TOTAL) BY MOUTH EVERY MORNING. 90 tablet 1   Continuous Glucose Sensor (FREESTYLE LIBRE 3 PLUS SENSOR) MISC Change sensor every 15 days. 6 each 3   gabapentin  (NEURONTIN ) 300 MG capsule TAKE ONE CAPSULE BY MOUTH EVERYDAY AT BEDTIME 90 capsule 3   glipiZIDE  (GLUCOTROL  XL) 5 MG 24 hr tablet Take 5 mg by mouth See admin instructions. Taking daily except for dialysis days (Tues/Thurs/Sat)     losartan  (COZAAR ) 100 MG tablet TAKE 1 TABLET BY MOUTH EVERY DAY IN THE MORNING 90 tablet 1   metoCLOPramide  (REGLAN ) 10 MG tablet TAKE 1 TABLET BY MOUTH 4 TIMES DAILY. 120 tablet 0   metoprolol  tartrate (LOPRESSOR ) 100 MG tablet Take 1 tablet (100 mg total) by mouth 2 (two) times daily. 180 tablet 3   potassium chloride  SA (KLOR-CON  M) 20 MEQ tablet TAKE 1 TABLET BY MOUTH EVERY DAY 90 tablet 1   tiZANidine  (ZANAFLEX ) 4 MG tablet Take 1 tablet (4 mg total) by mouth every 6 (six) hours as needed for muscle spasms. 90 tablet 1   torsemide  (DEMADEX ) 20 MG tablet Take 20 mg by mouth daily.     albuterol  (PROVENTIL ) (2.5  MG/3ML) 0.083% nebulizer solution Take 3 mLs (2.5 mg total) by nebulization every 6 (six) hours as needed for wheezing or shortness of breath. (Patient not taking: Reported on 06/26/2024) 75 mL 12   calcitRIOL  (ROCALTROL ) 0.5 MCG capsule Take 1 capsule (0.5 mcg total) by mouth daily. (Patient not taking: Reported on 06/26/2024)     chlorproMAZINE  (THORAZINE ) 25 MG tablet TAKE 1 TABLET BY MOUTH THREE TIMES A DAY (Patient not taking: Reported on 06/26/2024) 270 tablet 1   chlorthalidone (HYGROTON) 25 MG tablet Take 25 mg by mouth daily. (Patient not taking: Reported on 06/26/2024)     insulin  glargine (LANTUS ) 100 UNIT/ML injection Inject 15 Units into the skin daily as needed (Blood sugar). (Patient not taking: Reported on 06/26/2024)     pantoprazole  (PROTONIX ) 40 MG tablet Take 1 tablet (40 mg total) by mouth daily. (Patient not taking: Reported on 06/26/2024) 90 tablet 0   No current facility-administered medications on file prior to visit.    BP 120/60   Pulse 71   Temp 97.8 F (36.6 C) (Oral)   Ht 5' 9 (1.753 m)   Wt 151 lb (68.5 kg)   SpO2 98%   BMI 22.30 kg/m       Objective:   Physical Exam Vitals and nursing note reviewed.  Constitutional:      Appearance: Normal appearance.  Cardiovascular:     Rate and Rhythm: Normal rate and regular rhythm.     Pulses: Normal pulses.     Heart sounds: Normal heart sounds.  Pulmonary:     Effort: Pulmonary effort is normal.     Breath sounds: Normal breath sounds.  Skin:    General: Skin is warm and dry.  Neurological:     General: No focal deficit present.     Mental Status: He is alert and oriented to person, place, and time.  Psychiatric:  Mood and Affect: Mood normal.        Behavior: Behavior normal.        Thought Content: Thought content normal.        Judgment: Judgment normal.        Assessment & Plan:  1. Diabetes mellitus treated with oral medication (HCC) (Primary)  - POC HgB A1c- 6.4  - No change in  medication  - Follow up in 3 months  - Continuous Glucose Sensor (FREESTYLE LIBRE 3 PLUS SENSOR) MISC; Change sensor every 15 days.  Dispense: 2 each; Refill: 6  2. Essential hypertension - At goal - Follow direction by Nephrology   Darleene Shape, NP

## 2024-06-26 NOTE — Patient Instructions (Signed)
 Health Maintenance Due  Topic Date Due   FOOT EXAM  06/17/2024       03/18/2024   10:42 AM 02/07/2024   10:12 AM 02/28/2023    7:12 AM  Depression screen PHQ 2/9  Decreased Interest 3 0 0  Down, Depressed, Hopeless 3 0 0  PHQ - 2 Score 6 0 0  Altered sleeping 1  2  Tired, decreased energy 3  0  Change in appetite 2  1  Feeling bad or failure about yourself  2  0  Trouble concentrating 3  0  Moving slowly or fidgety/restless 3  1  Suicidal thoughts 1  0  PHQ-9 Score 21  4  Difficult doing work/chores   Not difficult at all

## 2024-06-30 DIAGNOSIS — N2581 Secondary hyperparathyroidism of renal origin: Secondary | ICD-10-CM | POA: Diagnosis not present

## 2024-06-30 DIAGNOSIS — T8249XD Other complication of vascular dialysis catheter, subsequent encounter: Secondary | ICD-10-CM | POA: Diagnosis not present

## 2024-06-30 DIAGNOSIS — D689 Coagulation defect, unspecified: Secondary | ICD-10-CM | POA: Diagnosis not present

## 2024-06-30 DIAGNOSIS — N186 End stage renal disease: Secondary | ICD-10-CM | POA: Diagnosis not present

## 2024-06-30 DIAGNOSIS — Z992 Dependence on renal dialysis: Secondary | ICD-10-CM | POA: Diagnosis not present

## 2024-07-03 DIAGNOSIS — N186 End stage renal disease: Secondary | ICD-10-CM | POA: Diagnosis not present

## 2024-07-03 DIAGNOSIS — Z992 Dependence on renal dialysis: Secondary | ICD-10-CM | POA: Diagnosis not present

## 2024-07-03 DIAGNOSIS — E1122 Type 2 diabetes mellitus with diabetic chronic kidney disease: Secondary | ICD-10-CM | POA: Diagnosis not present

## 2024-07-03 NOTE — Chronic Care Management (AMB) (Signed)
 A user error has taken place: encounter opened in error, closed for administrative reasons.

## 2024-07-04 DIAGNOSIS — D689 Coagulation defect, unspecified: Secondary | ICD-10-CM | POA: Diagnosis not present

## 2024-07-04 DIAGNOSIS — N186 End stage renal disease: Secondary | ICD-10-CM | POA: Diagnosis not present

## 2024-07-04 DIAGNOSIS — N2581 Secondary hyperparathyroidism of renal origin: Secondary | ICD-10-CM | POA: Diagnosis not present

## 2024-07-04 DIAGNOSIS — T8249XD Other complication of vascular dialysis catheter, subsequent encounter: Secondary | ICD-10-CM | POA: Diagnosis not present

## 2024-07-04 DIAGNOSIS — Z992 Dependence on renal dialysis: Secondary | ICD-10-CM | POA: Diagnosis not present

## 2024-07-07 DIAGNOSIS — N186 End stage renal disease: Secondary | ICD-10-CM | POA: Diagnosis not present

## 2024-07-07 DIAGNOSIS — N2581 Secondary hyperparathyroidism of renal origin: Secondary | ICD-10-CM | POA: Diagnosis not present

## 2024-07-07 DIAGNOSIS — T8249XD Other complication of vascular dialysis catheter, subsequent encounter: Secondary | ICD-10-CM | POA: Diagnosis not present

## 2024-07-07 DIAGNOSIS — D689 Coagulation defect, unspecified: Secondary | ICD-10-CM | POA: Diagnosis not present

## 2024-07-07 DIAGNOSIS — Z992 Dependence on renal dialysis: Secondary | ICD-10-CM | POA: Diagnosis not present

## 2024-07-08 ENCOUNTER — Other Ambulatory Visit (HOSPITAL_COMMUNITY): Payer: Self-pay | Admitting: Nephrology

## 2024-07-08 DIAGNOSIS — N186 End stage renal disease: Secondary | ICD-10-CM

## 2024-07-14 DIAGNOSIS — T8249XD Other complication of vascular dialysis catheter, subsequent encounter: Secondary | ICD-10-CM | POA: Diagnosis not present

## 2024-07-14 DIAGNOSIS — N186 End stage renal disease: Secondary | ICD-10-CM | POA: Diagnosis not present

## 2024-07-14 DIAGNOSIS — Z992 Dependence on renal dialysis: Secondary | ICD-10-CM | POA: Diagnosis not present

## 2024-07-14 DIAGNOSIS — N2581 Secondary hyperparathyroidism of renal origin: Secondary | ICD-10-CM | POA: Diagnosis not present

## 2024-07-14 DIAGNOSIS — Z23 Encounter for immunization: Secondary | ICD-10-CM | POA: Diagnosis not present

## 2024-07-14 DIAGNOSIS — D689 Coagulation defect, unspecified: Secondary | ICD-10-CM | POA: Diagnosis not present

## 2024-07-15 ENCOUNTER — Ambulatory Visit (HOSPITAL_COMMUNITY)
Admission: RE | Admit: 2024-07-15 | Discharge: 2024-07-15 | Disposition: A | Discharge status: Home / Self Care | Source: Ambulatory Visit | Attending: Nephrology | Admitting: Nephrology

## 2024-07-15 DIAGNOSIS — N186 End stage renal disease: Secondary | ICD-10-CM | POA: Insufficient documentation

## 2024-07-15 DIAGNOSIS — Z4901 Encounter for fitting and adjustment of extracorporeal dialysis catheter: Secondary | ICD-10-CM | POA: Insufficient documentation

## 2024-07-15 HISTORY — PX: IR REMOVAL TUN CV CATH W/O FL: IMG2289

## 2024-07-15 MED ORDER — LIDOCAINE-EPINEPHRINE 1 %-1:100000 IJ SOLN
INTRAMUSCULAR | Status: AC
  Filled 2024-07-15: qty 1

## 2024-07-15 MED ORDER — LIDOCAINE-EPINEPHRINE 1 %-1:100000 IJ SOLN: 20.0000 mL | Freq: Once | INTRAMUSCULAR | Status: DC

## 2024-07-15 NOTE — Procedures (Signed)
 Interventional Radiology Procedure Note  PROCEDURE SUMMARY:  Successful removal of tunneled catheter.  No complications.   EBL = trace  Please see full dictation in imaging section of Epic for procedure details.  Electronically Signed: Carlin DELENA Griffon, PA-C 07/15/2024, 2:18 PM

## 2024-07-21 DIAGNOSIS — Z992 Dependence on renal dialysis: Secondary | ICD-10-CM | POA: Diagnosis not present

## 2024-07-21 DIAGNOSIS — N186 End stage renal disease: Secondary | ICD-10-CM | POA: Diagnosis not present

## 2024-07-21 DIAGNOSIS — N2581 Secondary hyperparathyroidism of renal origin: Secondary | ICD-10-CM | POA: Diagnosis not present

## 2024-07-22 ENCOUNTER — Other Ambulatory Visit

## 2024-07-22 DIAGNOSIS — E119 Type 2 diabetes mellitus without complications: Secondary | ICD-10-CM

## 2024-07-22 NOTE — Progress Notes (Signed)
 07/22/2024 Name: Russell Austin MRN: [id number] DOB: 1950-11-27  Chief Complaint  Patient presents with   Medication Management   Diabetes    Russell Austin is a 73 y.o. year old male who presented for a telephone visit.   They were referred to the pharmacist by their PCP for assistance in managing diabetes and complex medication management.    Subjective:  Care Team: Primary Care Provider: Merna Huxley, NP   Medication Access/Adherence  Current Pharmacy:  [facility] [address] [place] - [address] [place] [address] Phone: [phone] Fax: [phone]  [facility] [address] [place] [address] Phone: [phone] Fax: [phone]   Patient reports affordability concerns with their medications: No  Patient reports access/transportation concerns to their pharmacy: No  Patient reports adherence concerns with their medications:  No  Diabetes:  Current Medications: Glipizide  5mg  daily except for dialysis days (Tues/Thurs/Sat)    No longer on insulin .    Hyperlipidemia/ASCVD Risk Reduction  Current lipid lowering medications: Atorvastatin  10mg  Medications tried in the past: None      Objective:  Lab Results  Component Value Date   HGBA1C 6.4 (A) 06/26/2024    Lab Results  Component Value Date   CREATININE 4.30 (H) 05/08/2024   BUN 39 (H) 05/08/2024   NA 138 05/08/2024   K 3.8 05/08/2024   CL 98 05/08/2024   CO2 29 01/26/2024    Lab Results  Component Value Date   CHOL 90 02/28/2023   HDL 26.30 (L) 02/28/2023   LDLCALC 46 02/28/2023   TRIG 88.0 02/28/2023   CHOLHDL 3 02/28/2023    Medications Reviewed Today     Reviewed by Lionell Jon DEL, RPH (Pharmacist) on 07/22/24 at 1004  Med List Status: <None>   Medication Order Taking? Sig Documenting Provider Last Dose Status Informant  amLODipine  (NORVASC ) 10 MG tablet [id number] Yes Take 1 tablet (10 mg  total) by mouth daily. Vernon Ranks, MD  Active Self, Pharmacy Records           Med Note JACKOLYN WADDELL DEL Stevan Apr 15, 2024 11:38 AM)    aspirin  EC 81 MG tablet [id number] Yes Take 81 mg by mouth daily. [provider]  Active Self, Pharmacy Records  atorvastatin  (LIPITOR) 10 MG tablet [id number] Yes Take 1 tablet (10 mg total) by mouth every morning. Nafziger, Huxley, NP  Active Self, Pharmacy Records  buPROPion  (WELLBUTRIN  XL) 300 MG 24 hr tablet [id number] Yes TAKE 1 TABLET (300 MG TOTAL) BY MOUTH EVERY MORNING. Nafziger, Huxley, NP  Active   Continuous Glucose Sensor (FREESTYLE LIBRE 3 PLUS SENSOR) [place] [id number]  Change sensor every 15 days. Nafziger, Huxley, NP  Active   gabapentin  (NEURONTIN ) 300 MG capsule [id number] Yes TAKE ONE CAPSULE BY MOUTH EVERYDAY AT BEDTIME Merna Huxley, NP  Active Self, Pharmacy Records           Med Note ROLENE EVERN PULLING A   Sat Jan 25, 2024  3:58 PM)    glipiZIDE  (GLUCOTROL  XL) 5 MG 24 hr tablet [id number] Yes Take 5 mg by mouth See admin instructions. Taking daily except for dialysis days (Tues/Thurs/Sat) [provider]  Active Self, Pharmacy Records  metoCLOPramide  (REGLAN ) 10 MG tablet [id number]  TAKE 1 TABLET BY MOUTH 4 TIMES DAILY.  Patient not taking: Reported on 07/22/2024   Merna Huxley, NP  Active Self, Pharmacy Records           Med  Note STEFFI NIAN   Sat Jan 25, 2024  1:52 PM)    potassium chloride  SA (KLOR-CON  M) 20 MEQ tablet [id number] Yes TAKE 1 TABLET BY MOUTH EVERY DAY Nafziger, Darleene, NP  Active   tiZANidine  (ZANAFLEX ) 4 MG tablet [id number] Yes Take 1 tablet (4 mg total) by mouth every 6 (six) hours as needed for muscle spasms. Merna Darleene, NP  Active Self, Pharmacy Records  torsemide  (DEMADEX ) 20 MG tablet [id number] Yes Take 20 mg by mouth daily. [provider]  Active Self, Pharmacy Records              Assessment/Plan:   Diabetes: - Currently controlled  - Reviewed long term  cardiovascular and renal outcomes of uncontrolled blood sugar - Reviewed goal A1c, goal fasting, and goal 2 hour post prandial glucose -Counseled on changing sensor when it is due to be replaced. -Continue current medication therapy. Counseled on importance of adherence to glipizide  on non-dialysis days      Follow Up Plan: 3 months  Jon VEAR Lindau, PharmD Clinical Pharmacist [phone]

## 2024-07-27 DIAGNOSIS — N186 End stage renal disease: Secondary | ICD-10-CM | POA: Diagnosis not present

## 2024-07-27 DIAGNOSIS — Z992 Dependence on renal dialysis: Secondary | ICD-10-CM | POA: Diagnosis not present

## 2024-07-27 DIAGNOSIS — N2581 Secondary hyperparathyroidism of renal origin: Secondary | ICD-10-CM | POA: Diagnosis not present

## 2024-08-02 DIAGNOSIS — E1122 Type 2 diabetes mellitus with diabetic chronic kidney disease: Secondary | ICD-10-CM | POA: Diagnosis not present

## 2024-08-02 DIAGNOSIS — N186 End stage renal disease: Secondary | ICD-10-CM | POA: Diagnosis not present

## 2024-08-02 DIAGNOSIS — Z992 Dependence on renal dialysis: Secondary | ICD-10-CM | POA: Diagnosis not present

## 2024-08-04 DIAGNOSIS — N186 End stage renal disease: Secondary | ICD-10-CM | POA: Diagnosis not present

## 2024-08-04 DIAGNOSIS — Z992 Dependence on renal dialysis: Secondary | ICD-10-CM | POA: Diagnosis not present

## 2024-08-04 DIAGNOSIS — N2581 Secondary hyperparathyroidism of renal origin: Secondary | ICD-10-CM | POA: Diagnosis not present

## 2024-08-13 ENCOUNTER — Telehealth: Payer: Self-pay

## 2024-08-13 NOTE — Progress Notes (Signed)
 Complex Care Management Note Care Guide Note  08/13/2024 Name: Russell Austin MRN: [id number] DOB: [birth year]   Complex Care Management Outreach Attempts: An unsuccessful telephone outreach was attempted today to offer the patient information about available complex care management services.  Follow Up Plan:  Additional outreach attempts will be made to offer the patient complex care management information and services.   Encounter Outcome:  No Answer  [facility]  [facility], [facility] VBCI Assistant Direct Dial : [phone]  Fax: [phone]

## 2024-08-17 NOTE — Progress Notes (Unsigned)
 Complex Care Management Note Care Guide Note  08/17/2024 Name: Russell Austin MRN: [id number] DOB: 01-22-1951   Complex Care Management Outreach Attempts: A second unsuccessful outreach was attempted today to offer the patient with information about available complex care management services.  Follow Up Plan:  Additional outreach attempts will be made to offer the patient complex care management information and services.   Encounter Outcome:  No Answer  [facility]ream[facility]  [facility], [facility] VBCI Assistant Direct Dial : [phone]  Fax: [phone]

## 2024-08-20 NOTE — Progress Notes (Signed)
 Complex Care Management Note Care Guide Note  08/20/2024 Name: Russell Austin MRN: [id number] DOB: [birth year]   Complex Care Management Outreach Attempts: A third unsuccessful outreach was attempted today to offer the patient with information about available complex care management services.  Follow Up Plan:  No further outreach attempts will be made at this time. We have been unable to contact the patient to offer or enroll patient in complex care management services.  Encounter Outcome:  No Answer  [facility]reama [facility]  [facility], [facility] VBCI Assistant Direct Dial : [phone]  Fax: [phone]

## 2024-08-24 ENCOUNTER — Ambulatory Visit: Admitting: Podiatry

## 2024-08-24 LAB — OPHTHALMOLOGY REPORT-SCANNED

## 2024-09-25 ENCOUNTER — Ambulatory Visit: Admitting: Adult Health

## 2024-09-25 ENCOUNTER — Encounter: Payer: Self-pay | Admitting: Adult Health

## 2024-09-25 VITALS — BP 120/60 | HR 68 | Temp 97.8°F | Ht 69.0 in | Wt 150.0 lb

## 2024-09-25 DIAGNOSIS — Z7984 Long term (current) use of oral hypoglycemic drugs: Secondary | ICD-10-CM

## 2024-09-25 DIAGNOSIS — K5900 Constipation, unspecified: Secondary | ICD-10-CM | POA: Diagnosis not present

## 2024-09-25 DIAGNOSIS — E1140 Type 2 diabetes mellitus with diabetic neuropathy, unspecified: Secondary | ICD-10-CM

## 2024-09-25 DIAGNOSIS — I10 Essential (primary) hypertension: Secondary | ICD-10-CM

## 2024-09-25 DIAGNOSIS — N186 End stage renal disease: Secondary | ICD-10-CM | POA: Diagnosis not present

## 2024-09-25 DIAGNOSIS — Z794 Long term (current) use of insulin: Secondary | ICD-10-CM | POA: Diagnosis not present

## 2024-09-25 DIAGNOSIS — E119 Type 2 diabetes mellitus without complications: Secondary | ICD-10-CM

## 2024-09-25 LAB — POCT GLYCOSYLATED HEMOGLOBIN (HGB A1C): Hemoglobin A1C: 6.6 % — AB (ref 4.0–5.6)

## 2024-09-25 MED ORDER — LACTULOSE 20 GM/30ML PO SOLN: 15.0000 mL | Freq: Every day | ORAL | 1 refills | Status: AC | PRN

## 2024-09-25 NOTE — Progress Notes (Signed)
 "  Subjective:    Patient ID: Russell Austin, male    DOB: [birth year], 74 y.o.   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  He presents to the office today for three month follow up regarding DM and HTN   DM II -he is currently managed with glipizide  5 mg at bedtime. He denies hypoglycemic events. He is using a CGM to monitor his blood sugars.   CONTINUOUS GLUCOSE MONITORING RECORD INTERPRETATION                 Dates of Recording: October 26th - Jan 23rd 2026   Sensor description: freestyle libre    Results statistics:      Average  174  Time in range 59%  % Time Above 180 26%  % Time above 250 15%  % Time Below target 0%    Lab Results  Component Value Date   HGBA1C 6.6 (A) 09/25/2024   HGBA1C 6.4 (A) 06/26/2024   HGBA1C 6.9 (A) 03/18/2024    Hypertension with chronic kidney disease on dialysis -managed with Norvasc  5 mg daily, He denies dizziness, lightheadedness, chest pain, shortness of breath, or syncopal episodes. 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)    Chronic kidney disease    Diabetes mellitus type II    Diabetic retinopathy FOLLOWED BY DR ELNER   ED (erectile dysfunction)    Glaucoma of both  eyes    Hypertension    Hypertensive urgency 12/27/2023   Left hydrocele    Stroke [facility])     Social History   Socioeconomic History   Marital status: Married    Spouse name: Not on file   Number of children: 1   Years of education: 12   Highest education level: 12th grade  Occupational History   Occupation: Disabled  Tobacco Use   Smoking status: Never   Smokeless tobacco: Never  Vaping Use   Vaping status: Never Used  Substance and Sexual Activity   Alcohol use: No   Drug use: No   Sexual activity: Not on file  Other Topics Concern   Not on file  Social History Narrative   Lives at home with his wife and granddaughter.   Left-handed.   3 cups caffeine per day.   Social Drivers of Health   Tobacco Use: Low Risk (09/25/2024)   Patient History    Smoking Tobacco Use: Never    Smokeless Tobacco Use: Never    Passive Exposure: Not on file  Financial Resource Strain: Low Risk (03/14/2024)   Overall Financial Resource Strain (CARDIA)    Difficulty of Paying Living Expenses: Not hard at all  Food Insecurity: No Food Insecurity (03/14/2024)   Epic    Worried About Radiation Protection Practitioner of Food in the  Last Year: Never true    Ran Out of Food in the Last Year: Never true  Transportation Needs: No Transportation Needs (03/14/2024)   Epic    Lack of Transportation (Medical): No    Lack of Transportation (Non-Medical): No  Physical Activity: Inactive (03/14/2024)   Exercise Vital Sign    Days of Exercise per Week: 0 days    Minutes of Exercise per Session: Not on file  Stress: Stress Concern Present (03/14/2024)   [employer] of Occupational Health - Occupational Stress Questionnaire    Feeling of Stress: Very much  Social Connections: Socially Integrated (03/14/2024)   Social Connection and Isolation Panel    Frequency of Communication with Friends and Family: More than three times a week    Frequency of Social Gatherings with Friends and Family: Twice a week    Attends  Religious Services: More than 4 times per year    Active Member of Clubs or Organizations: Yes    Attends Banker Meetings: Never    Marital Status: Married  Catering Manager Violence: Not At Risk (02/07/2024)   Humiliation, Afraid, Rape, and Kick questionnaire    Fear of Current or Ex-Partner: No    Emotionally Abused: No    Physically Abused: No    Sexually Abused: No  Depression (PHQ2-9): High Risk (06/26/2024)   Depression (PHQ2-9)    PHQ-2 Score: 18  Alcohol Screen: Low Risk (02/07/2024)   Alcohol Screen    Last Alcohol Screening Score (AUDIT): 0  Housing: Unknown (03/14/2024)   Epic    Unable to Pay for Housing in the Last Year: No    Number of Times Moved in the Last Year: Not on file    Homeless in the Last Year: No  Utilities: Not At Risk (02/07/2024)   [facility] Utilities    Threatened with loss of utilities: No  Health Literacy: Adequate Health Literacy (02/07/2024)   B1300 Health Literacy    Frequency of need for help with medical instructions: Never    Past Surgical History:  Procedure Laterality Date   APPENDECTOMY  AGE EARLY 20'S   AV FISTULA PLACEMENT Right 03/09/2024   Procedure: ARTERIOVENOUS (AV) FISTULA CREATION;  Surgeon: Lanis Fonda BRAVO, MD;  Location: [facility] OR;  Service: Vascular;  Laterality: Right;   BASCILIC VEIN TRANSPOSITION Right 05/08/2024   Procedure: RIGHT BASILIC VEIN TRANSPOSITION;  Surgeon: Lanis Fonda BRAVO, MD;  Location: [facility] OR;  Service: Vascular;  Laterality: Right;   CAPD INSERTION N/A 04/11/2023   Procedure: LAPAROSCOPIC INSERTION CONTINUOUS AMBULATORY PERITONEAL DIALYSIS  (CAPD) CATHETER WITH  OMENTOPEXY;  Surgeon: Magda Debby SAILOR, MD;  Location: [facility] OR;  Service: Vascular;  Laterality: N/A;   CAPD REMOVAL N/A 12/30/2023   Procedure: CONTINUOUS AMBULATORY PERITONEAL DIALYSIS  (CAPD) CATHETER REMOVAL;  Surgeon: Lanis Fonda BRAVO, MD;  Location: [facility] OR;  Service: Vascular;  Laterality: N/A;   COLONOSCOPY  12/30/2020   every 5 years   HYDROCELE  EXCISION  03/31/2012   Procedure: HYDROCELECTOMY ADULT;  Surgeon: Garnette Shack, MD;  Location: [facility];  Service: Urology;  Laterality: Left;  45 MINS     INSERTION OF DIALYSIS CATHETER N/A 12/30/2023   Procedure: EXCHANGE OF DIALYSIS CATHETER;  Surgeon: Lanis Fonda BRAVO, MD;  Location: [facility] OR;  Service: Vascular;  Laterality: N/A;   IR FLUORO GUIDE CV LINE LEFT  12/27/2023   IR REMOVAL TUN CV CATH W/O FL  07/15/2024   IR US  GUIDE VASC ACCESS LEFT  12/27/2023   LEFT  EYE LASER RETINA REPAIR  SEPT 2012   RIGHT EYE VITRECTOMY/ INSERTION GLAUCOMA SETON/ LASER REPAIR  12-13-2008   RETINAL ARTERY OCCLUSION /NEOVASCULAR GLAUCOMA/ HEMORRHAGE   RIGHT EYE VITRETOMY/ INSERTION GLAUCOMA SETON X2/ LASER  03-24-2009   RECURRENT HEMORRHAGE/ OCCLUSION INTERNAL SETON   SHOULDER ARTHROSCOPY Right 2005   undescended right testicle removed  1994    Family History  Problem Relation Age of Onset   Glaucoma Mother    Diabetes Mother    Stomach cancer Maternal Grandfather    Stroke Maternal Grandmother     Allergies[1]  Medications Ordered Prior to Encounter[2]  BP 120/60   Pulse 68   Temp 97.8 F (36.6 C) (Oral)   Ht 5' 9 (1.753 m)   Wt 150 lb (68 kg)   SpO2 92%   BMI 22.15 kg/m       Objective:   Physical Exam Vitals and nursing note reviewed.  Constitutional:      Appearance: Normal appearance.  Cardiovascular:     Rate and Rhythm: Normal rate and regular rhythm.     Pulses: Normal pulses.     Heart sounds: Normal heart sounds.  Pulmonary:     Effort: Pulmonary effort is normal.     Breath sounds: Normal breath sounds.  Musculoskeletal:        General: Normal range of motion.  Skin:    General: Skin is warm and dry.  Neurological:     General: No focal deficit present.     Mental Status: He is alert and oriented to person, place, and time.  Psychiatric:        Mood and Affect: Mood normal.        Behavior: Behavior normal.        Thought Content: Thought  content normal.        Judgment: Judgment normal.        Assessment & Plan:  1. Diabetes mellitus treated with oral medication (HCC) (Primary)  - POC HgB A1c- 6.6  - No change in medication  - Follow up in 6 months   2. Essential hypertension - well controlled. No change in medication   3. Constipation, unspecified constipation type  - Lactulose 20 GM/30ML SOLN; Take 15 mLs (10 g total) by mouth daily as needed.  Dispense: 473 mL; Refill: 1  4. ESRD (end stage renal disease) (HCC) - Per nephrology  - Continue with dialysis   Darleene Shape, NP      [1]  Allergies Allergen Reactions   Lactose Intolerance (Gi) Diarrhea   Peach [Prunus Persica] Itching and Other (See Comments)    ITCHY THROAT   Apple Pectin [Pectin] Itching and Other (See Comments)    ITCHY THROAT   Metformin  And Related Other (See Comments)    D/t decreased kidney function    Morphine  And Codeine Anxiety  [2]  Current Outpatient Medications on File Prior to Visit  Medication Sig Dispense Refill   amLODipine  (NORVASC ) 10 MG tablet Take 1 tablet (10 mg total) by mouth daily. 30 tablet 0   aspirin  EC 81 MG tablet Take 81 mg by mouth daily.     atorvastatin  (LIPITOR) 10 MG tablet Take 1 tablet (10 mg total) by mouth every morning. 90 tablet 3   buPROPion  (WELLBUTRIN  XL) 300 MG 24 hr tablet TAKE 1 TABLET (300 MG TOTAL) BY MOUTH EVERY MORNING. 90 tablet 1   Continuous Glucose Sensor (FREESTYLE LIBRE 3 PLUS SENSOR) MISC Change sensor every 15 days. 2 each 6  gabapentin  (NEURONTIN ) 300 MG capsule TAKE ONE CAPSULE BY MOUTH EVERYDAY AT BEDTIME 90 capsule 3   glipiZIDE  (GLUCOTROL  XL) 5 MG 24 hr tablet Take 5 mg by mouth See admin instructions. Taking daily except for dialysis days (Tues/Thurs/Sat)     potassium chloride  SA (KLOR-CON  M) 20 MEQ tablet TAKE 1 TABLET BY MOUTH EVERY DAY 90 tablet 1   sevelamer carbonate (RENVELA) 800 MG tablet Take 1,600 mg by mouth 3 (three) times daily.     tiZANidine  (ZANAFLEX ) 4  MG tablet Take 1 tablet (4 mg total) by mouth every 6 (six) hours as needed for muscle spasms. 90 tablet 1   torsemide  (DEMADEX ) 20 MG tablet Take 20 mg by mouth daily.     No current facility-administered medications on file prior to visit.   "

## 2024-10-06 ENCOUNTER — Encounter: Payer: Self-pay | Admitting: Adult Health

## 2024-10-06 MED ORDER — AMLODIPINE BESYLATE 10 MG PO TABS: 10.0000 mg | ORAL_TABLET | Freq: Every day | ORAL | 0 refills | Status: AC

## 2024-10-28 ENCOUNTER — Other Ambulatory Visit

## 2024-11-25 ENCOUNTER — Ambulatory Visit: Admitting: Podiatry

## 2025-02-12 ENCOUNTER — Ambulatory Visit

## 2025-03-26 ENCOUNTER — Ambulatory Visit: Admitting: Adult Health
